# Patient Record
Sex: Female | Born: 1960 | Race: White | Hispanic: No | Smoking: Former smoker
Health system: Southern US, Community
[De-identification: ages and names within clinical notes are randomized; demographics above are authoritative.]

## PROBLEM LIST (undated history)

## (undated) DIAGNOSIS — K589 Irritable bowel syndrome without diarrhea: Secondary | ICD-10-CM

## (undated) DIAGNOSIS — F419 Anxiety disorder, unspecified: Secondary | ICD-10-CM

## (undated) DIAGNOSIS — K5000 Crohn's disease of small intestine without complications: Secondary | ICD-10-CM

## (undated) DIAGNOSIS — R51 Headache: Secondary | ICD-10-CM

## (undated) DIAGNOSIS — K449 Diaphragmatic hernia without obstruction or gangrene: Secondary | ICD-10-CM

## (undated) DIAGNOSIS — K561 Intussusception: Secondary | ICD-10-CM

## (undated) DIAGNOSIS — J40 Bronchitis, not specified as acute or chronic: Secondary | ICD-10-CM

## (undated) DIAGNOSIS — I341 Nonrheumatic mitral (valve) prolapse: Secondary | ICD-10-CM

## (undated) DIAGNOSIS — T884XXA Failed or difficult intubation, initial encounter: Secondary | ICD-10-CM

## (undated) DIAGNOSIS — R32 Unspecified urinary incontinence: Secondary | ICD-10-CM

## (undated) DIAGNOSIS — F32A Depression, unspecified: Secondary | ICD-10-CM

## (undated) DIAGNOSIS — F329 Major depressive disorder, single episode, unspecified: Secondary | ICD-10-CM

## (undated) DIAGNOSIS — I10 Essential (primary) hypertension: Secondary | ICD-10-CM

## (undated) DIAGNOSIS — D126 Benign neoplasm of colon, unspecified: Secondary | ICD-10-CM

## (undated) DIAGNOSIS — K219 Gastro-esophageal reflux disease without esophagitis: Secondary | ICD-10-CM

## (undated) DIAGNOSIS — R011 Cardiac murmur, unspecified: Secondary | ICD-10-CM

## (undated) DIAGNOSIS — G2581 Restless legs syndrome: Secondary | ICD-10-CM

## (undated) DIAGNOSIS — D649 Anemia, unspecified: Secondary | ICD-10-CM

## (undated) HISTORY — DX: Intussusception: K56.1

## (undated) HISTORY — DX: Crohn's disease of small intestine without complications: K50.00

## (undated) HISTORY — DX: Diaphragmatic hernia without obstruction or gangrene: K44.9

## (undated) HISTORY — DX: Nonrheumatic mitral (valve) prolapse: I34.1

## (undated) HISTORY — DX: Irritable bowel syndrome without diarrhea: K58.9

## (undated) HISTORY — PX: ILEOSTOMY: SHX1783

## (undated) HISTORY — DX: Restless legs syndrome: G25.81

## (undated) HISTORY — PX: TONSILLECTOMY: SUR1361

---
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
## 2000-11-13 ENCOUNTER — Encounter: Admission: RE | Admit: 2000-11-13 | Discharge: 2000-11-13 | Payer: Self-pay | Admitting: Obstetrics and Gynecology

## 2000-11-13 ENCOUNTER — Encounter: Payer: Self-pay | Admitting: Obstetrics and Gynecology

## 2002-05-03 ENCOUNTER — Ambulatory Visit (HOSPITAL_COMMUNITY): Admission: RE | Admit: 2002-05-03 | Discharge: 2002-05-03 | Payer: Self-pay | Admitting: Gastroenterology

## 2002-05-03 ENCOUNTER — Encounter: Payer: Self-pay | Admitting: Gastroenterology

## 2002-05-03 DIAGNOSIS — K2100 Gastro-esophageal reflux disease with esophagitis, without bleeding: Secondary | ICD-10-CM | POA: Insufficient documentation

## 2002-05-03 DIAGNOSIS — K449 Diaphragmatic hernia without obstruction or gangrene: Secondary | ICD-10-CM | POA: Insufficient documentation

## 2005-08-28 ENCOUNTER — Ambulatory Visit: Payer: Self-pay | Admitting: Gastroenterology

## 2005-10-01 ENCOUNTER — Ambulatory Visit: Payer: Self-pay | Admitting: Family Medicine

## 2007-02-16 ENCOUNTER — Ambulatory Visit: Payer: Self-pay | Admitting: Family Medicine

## 2007-03-01 ENCOUNTER — Ambulatory Visit: Payer: Self-pay | Admitting: Family Medicine

## 2007-07-08 ENCOUNTER — Ambulatory Visit: Payer: Self-pay | Admitting: Family Medicine

## 2007-10-26 ENCOUNTER — Ambulatory Visit: Payer: Self-pay | Admitting: Family Medicine

## 2007-11-03 ENCOUNTER — Ambulatory Visit: Payer: Self-pay | Admitting: Family Medicine

## 2008-03-10 ENCOUNTER — Ambulatory Visit: Payer: Self-pay | Admitting: Family Medicine

## 2008-03-17 ENCOUNTER — Telehealth: Payer: Self-pay | Admitting: Gastroenterology

## 2008-03-17 ENCOUNTER — Ambulatory Visit: Payer: Self-pay | Admitting: Gastroenterology

## 2008-03-17 DIAGNOSIS — K219 Gastro-esophageal reflux disease without esophagitis: Secondary | ICD-10-CM | POA: Insufficient documentation

## 2008-03-17 DIAGNOSIS — I341 Nonrheumatic mitral (valve) prolapse: Secondary | ICD-10-CM | POA: Insufficient documentation

## 2008-03-17 DIAGNOSIS — R198 Other specified symptoms and signs involving the digestive system and abdomen: Secondary | ICD-10-CM | POA: Insufficient documentation

## 2008-04-03 ENCOUNTER — Ambulatory Visit: Payer: Self-pay | Admitting: Family Medicine

## 2008-04-04 ENCOUNTER — Ambulatory Visit: Payer: Self-pay | Admitting: Gastroenterology

## 2008-04-04 LAB — HM COLONOSCOPY: HM Colonoscopy: NORMAL

## 2008-05-03 ENCOUNTER — Ambulatory Visit: Payer: Self-pay | Admitting: Family Medicine

## 2008-05-22 ENCOUNTER — Ambulatory Visit: Payer: Self-pay | Admitting: Gastroenterology

## 2008-07-26 ENCOUNTER — Ambulatory Visit: Payer: Self-pay | Admitting: Family Medicine

## 2008-07-27 ENCOUNTER — Ambulatory Visit: Payer: Self-pay | Admitting: Family Medicine

## 2008-08-07 ENCOUNTER — Ambulatory Visit: Payer: Self-pay | Admitting: Family Medicine

## 2008-10-13 ENCOUNTER — Ambulatory Visit: Payer: Self-pay | Admitting: Family Medicine

## 2008-11-03 HISTORY — PX: KNEE ARTHROSCOPY: SUR90

## 2009-01-01 ENCOUNTER — Ambulatory Visit: Payer: Self-pay | Admitting: Family Medicine

## 2009-01-01 ENCOUNTER — Encounter: Admission: RE | Admit: 2009-01-01 | Discharge: 2009-01-01 | Payer: Self-pay | Admitting: Family Medicine

## 2009-01-02 ENCOUNTER — Ambulatory Visit: Payer: Self-pay | Admitting: Family Medicine

## 2009-02-19 ENCOUNTER — Ambulatory Visit: Payer: Self-pay | Admitting: Family Medicine

## 2009-02-20 ENCOUNTER — Encounter: Admission: RE | Admit: 2009-02-20 | Discharge: 2009-02-20 | Payer: Self-pay | Admitting: Family Medicine

## 2009-04-30 ENCOUNTER — Ambulatory Visit: Payer: Self-pay | Admitting: Family Medicine

## 2009-06-26 ENCOUNTER — Ambulatory Visit: Payer: Self-pay | Admitting: Family Medicine

## 2009-06-26 ENCOUNTER — Encounter: Admission: RE | Admit: 2009-06-26 | Discharge: 2009-06-26 | Payer: Self-pay | Admitting: Family Medicine

## 2009-07-06 ENCOUNTER — Ambulatory Visit (HOSPITAL_BASED_OUTPATIENT_CLINIC_OR_DEPARTMENT_OTHER): Admission: RE | Admit: 2009-07-06 | Discharge: 2009-07-06 | Payer: Self-pay | Admitting: Specialist

## 2009-07-17 ENCOUNTER — Ambulatory Visit: Payer: Self-pay | Admitting: Family Medicine

## 2010-02-08 ENCOUNTER — Ambulatory Visit: Payer: Self-pay | Admitting: Family Medicine

## 2010-03-21 ENCOUNTER — Ambulatory Visit: Payer: Self-pay | Admitting: Family Medicine

## 2010-03-22 ENCOUNTER — Encounter: Admission: RE | Admit: 2010-03-22 | Discharge: 2010-03-22 | Payer: Self-pay | Admitting: Family Medicine

## 2010-03-22 ENCOUNTER — Ambulatory Visit: Payer: Self-pay | Admitting: Family Medicine

## 2010-04-02 ENCOUNTER — Ambulatory Visit: Payer: Self-pay | Admitting: Family Medicine

## 2010-04-11 ENCOUNTER — Ambulatory Visit: Payer: Self-pay | Admitting: Family Medicine

## 2010-04-19 ENCOUNTER — Ambulatory Visit: Payer: Self-pay | Admitting: Family Medicine

## 2010-05-03 ENCOUNTER — Ambulatory Visit: Payer: Self-pay | Admitting: Family Medicine

## 2010-06-04 ENCOUNTER — Ambulatory Visit: Payer: Self-pay | Admitting: Family Medicine

## 2010-06-21 ENCOUNTER — Ambulatory Visit: Payer: Self-pay | Admitting: Family Medicine

## 2010-07-01 ENCOUNTER — Ambulatory Visit: Payer: Self-pay | Admitting: Family Medicine

## 2010-08-09 ENCOUNTER — Ambulatory Visit: Payer: Self-pay | Admitting: Family Medicine

## 2010-09-13 ENCOUNTER — Ambulatory Visit: Payer: Self-pay | Admitting: Family Medicine

## 2010-11-03 DIAGNOSIS — D649 Anemia, unspecified: Secondary | ICD-10-CM

## 2010-11-03 HISTORY — DX: Anemia, unspecified: D64.9

## 2010-12-03 NOTE — Progress Notes (Signed)
Summary: UPPER ABD PAIN  Medications Added NEXIUM 40 MG CPDR (ESOMEPRAZOLE MAGNESIUM) Take 1 capsule by mouth once a day       Phone Note Call from Patient Call back at [facility] [phone]   Call For: DR [facility] Summary of Call: SEVERE ABD PAIN. UPPER PART OF STOMACH X2WK GETTING WORSE. ON NEXIUM NOT REALLY HELPING. JUST COULD NOT WAIT UNTIL 04-12-08 NEXT AVAIL APPT. CHART REQ FROM CF. Initial call taken by: Irwin Brakeman [facility],  Mar 17, 2008 9:38 AM  Follow-up for Phone Call        rev today with stark 3:00 Follow-up by: Barb Merino RN,  Mar 17, 2008 9:46 AM    New/Updated Medications: NEXIUM 40 MG CPDR (ESOMEPRAZOLE MAGNESIUM) Take 1 capsule by mouth once a day

## 2010-12-03 NOTE — Procedures (Signed)
Summary: Gastroenterology EGD  Gastroenterology EGD   Imported By: Christie Nottingham CMA 03/17/2008 11:47:02  _____________________________________________________________________  External Attachment:    Type:   Image     Comment:   External Document

## 2010-12-03 NOTE — Procedures (Signed)
Summary: Colonoscopy   Colonoscopy  Procedure date:  04/04/2008  Findings:      Location:  [place].    Procedures Next Due Date:    Colonoscopy: 04/2018  Patient Name: Cynthia [name], Cynthia [name] MRN: [id number] Procedure Procedures: Colonoscopy CPT: 13244.  Personnel: Endoscopist: Pricilla Riffle. Fuller Plan, MD, Marval Regal.  Exam Location: Exam performed in Outpatient Clinic. Outpatient  Patient Consent: Procedure, Alternatives, Risks and Benefits discussed, consent obtained, from patient. Consent was obtained by the RN.  Indications Symptoms: Diarrhea Abdominal pain / bloating. Change in bowel habits.  History  Current Medications: Patient is not currently taking Coumadin.  Pre-Exam Physical: Performed Apr 04, 2008. Cardio-pulmonary exam, Rectal exam, HEENT exam , Abdominal exam, Mental status exam WNL.  Comments: Pt. history reviewed/updated, physical exam performed prior to initiation of sedation?Yes Exam Exam: Extent of exam reached: Cecum, extent intended: Cecum.  The cecum was identified by appendiceal orifice and IC valve. Time to Cecum: 00:06: 29. Time for Withdrawl: 00:09:18. Colon retroflexion performed. ASA Classification: II. Tolerance: excellent.  Monitoring: Pulse and BP monitoring, Oximetry used. Supplemental O2 given.  Colon Prep Used MoviPrep for colon prep. Prep results: excellent.  Sedation Meds: Patient assessed and found to be appropriate for moderate (conscious) sedation. Fentanyl 75 mcg. given IV. Versed 10 mg. given IV.  Comments: mildly tortuous colon Findings NORMAL EXAM: Cecum to Rectum.   Assessment Normal examination.  Events  Unplanned Interventions: No intervention was required.  Unplanned Events: There were no complications. Plans  Post Exam Instructions: Post sedation instructions given.  Patient Education: Patient given standard instructions for: a normal exam. IBS.  Disposition: After procedure patient sent to recovery.  After recovery patient sent home.  Scheduling/Referral: Colonoscopy, to [facility] T. Fuller Plan, MD, [facility], around Apr 04, 2018.  Office Visit, to [employer]. Fuller Plan, MD, [facility], around May 04, 2008.    cc: John C. LaLonde MD  This report was created from the original endoscopy report, which was reviewed and signed by the above listed endoscopist.   Appended Document: Colonoscopy     Colonoscopy  Procedure date:  04/04/2008  Findings:      Location:  [place].    Procedures Next Due Date:    Colonoscopy: 04/2018   Patient Name: Cynthia [name], Cynthia [name] MRN: [id number] Procedure Procedures: Colonoscopy CPT: 66440.  Personnel: Endoscopist: Pricilla Riffle. Fuller Plan, MD, Marval Regal.  Exam Location: Exam performed in Outpatient Clinic. Outpatient  Patient Consent: Procedure, Alternatives, Risks and Benefits discussed, consent obtained, from patient. Consent was obtained by the RN.  Indications Symptoms: Diarrhea Abdominal pain / bloating. Change in bowel habits.  History  Current Medications: Patient is not currently taking Coumadin.  Pre-Exam Physical: Performed Apr 04, 2008. Cardio-pulmonary exam, Rectal exam, HEENT exam , Abdominal exam, Mental status exam WNL.  Comments: Pt. history reviewed/updated, physical exam performed prior to initiation of sedation?Yes Exam Exam: Extent of exam reached: Cecum, extent intended: Cecum.  The cecum was identified by appendiceal orifice and IC valve. Time to Cecum: 00:06: 29. Time for Withdrawl: 00:09:18. Colon retroflexion performed. ASA Classification: II. Tolerance: excellent.  Monitoring: Pulse and BP monitoring, Oximetry used. Supplemental O2 given.  Colon Prep Used MoviPrep for colon prep. Prep results: excellent.  Sedation Meds: Patient assessed and found to be appropriate for moderate (conscious) sedation. Fentanyl 75 mcg. given IV. Versed 10 mg. given IV.  Comments: mildly tortuous colon Findings NORMAL EXAM: Cecum to Rectum.    Assessment Normal examination.  Events  Unplanned Interventions: No intervention was required.  Unplanned Events: There were no complications. Plans  Post Exam Instructions: Post sedation instructions given.  Patient Education: Patient given standard instructions for: a normal exam. IBS.  Disposition: After procedure patient sent to recovery. After recovery patient sent home.  Scheduling/Referral: Colonoscopy, to [facility] T. Fuller Plan, MD, [facility], around Apr 04, 2018.  Office Visit, to [employer]. Fuller Plan, MD, [facility], around May 04, 2008.    cc:  John C. LaLonde MD  This report was created from the original endoscopy report, which was reviewed and signed by the above listed endoscopist.

## 2010-12-03 NOTE — Assessment & Plan Note (Signed)
Summary: epigastric pain-sheri   History of Present Illness Visit Type: follow up Primary GI MD: Claudette Head MD [facility] Primary Provider: Sharlot Gowda MD Chief Complaint abdominal pain History of Present Illness:  Cynthia [name] relates epigastric pain associated with a change in bowel habits including alternating diarrhea and constipation for approximately 10 days. She notes no melena or hematochezia. She denies any recent antibiotic usage or medication changes. She relates worsening heartburn belching and bloating. She takes Advil infrequently and she denies other aspirin and NSAID usage.   GI Review of Systems    Reports abdominal pain, acid reflux, belching, bloating, and  heartburn.   Location of  Abdominal pain: epigastric area.     Denies chest pain, dysphagia with liquids, dysphagia with solids, loss of appetite, nausea, vomiting, vomiting blood, weight loss, and  weight gain.    Reports change in bowel habits, constipation, and  diarrhea.    Denies anal fissure, black tarry stools, diverticulosis, fecal incontinence, heme positive stool, hemorrhoids, irritable bowel syndrome, jaundice, light color stool, liver problems, rectal bleeding, and  rectal pain.     Updated Prior Medication List: NEXIUM 40 MG CPDR (ESOMEPRAZOLE MAGNESIUM) Take 1 capsule by mouth once a day EFFEXOR XR 150 MG  CP24 (VENLAFAXINE HCL) 2 once daily ATENOLOL 25 MG  TABS (ATENOLOL) 1once daily IMITREX 100 MG  TABS (SUMATRIPTAN SUCCINATE) prn ADVIL 100 MG  TABS (IBUPROFEN) prn FLEXERIL 10 MG  TABS (CYCLOBENZAPRINE HCL) prn  Current Allergies (reviewed today): ! * IODINE  Past Medical History:    Reviewed history from 03/17/2008 and no changes required:       ESOPHAGITIS, REFLUX (ICD-530.11)       HIATAL HERNIA (ICD-553.3)       MITRAL VALVE PROLAPSE, HX OF (ICD-V12.50)       GERD (ICD-530.81)       MIGRAINE HEADACHES  Past Surgical History:    Reviewed history from 03/17/2008 and no changes  required:       Tonsillectomy   Family History:    Father Skin cancer (melanoma)    Family History of Diabetes: mother  Social History:    Patient currently smokes. 1/2 pack per day    Alcohol Use - yes social    Daily Caffeine Use 2 per day    Illicit Drug Use - no    Patient does not get regular exercise.    Risk Factors:  Tobacco use:  current Drug use:  no Alcohol use:  yes Exercise:  no    Vital Signs:  Patient Profile:   50 Years Old Female CC:      Abdominal Pain Height:     64 inches Weight:      208.50 pounds BMI:     35.92 Pulse rate:   82 / minute Pulse rhythm:   regular BP sitting:   126 / 70  (left arm)  Pt. in pain?   yes    Location:   abdomen    Intensity:   6    Type:       sharp  Vitals Entered By: Chales Abrahams CMA (Mar 17, 2008 2:56 PM)                   Physical Exam  General:     Well developed, well nourished, no acute distress. Head:     Normocephalic and atraumatic. Eyes:     PERRLA, no icterus. Ears:     Normal auditory acuity. Mouth:  No deformity or lesions, dentition normal. Neck:     Supple; no masses or thyromegaly. Chest Wall:     Symmetrical;  no deformities or tenderness. Lungs:     Clear throughout to auscultation. Heart:     Regular rate and rhythm; no murmurs, rubs,  or bruits. Abdomen:     Soft, epigastric tenderness without rebound or guarding. Nondistended. No masses, hepatosplenomegaly or hernias noted. Normal bowel sounds. Msk:     Symmetrical with no gross deformities. Normal posture. Extremities:     No clubbing, cyanosis, edema or deformities noted. Neurologic:     Alert and  oriented x4;  grossly normal neurologically. Psych:     Alert and cooperative. Normal mood and affect.    Impression & Recommendations:  Problem # 1:  CHANGE IN BOWELS (ICD-787.99) New alternating diarrhea and constipation with abdominal bloating and epigastric pain. Rule out colorectal neoplasms, inflammatory bowel  disease, irritable bowel syndrome and other disorders. Obtain stool Hemoccults. Schedule colonoscopy. The risks benefits and alternatives to colonoscopy with possible biopsy and possible polypectomy were discussed with the patient and she consents to proceed.Robinul Forte 2 mg p.o. b.i.d. Orders: Colonoscopy (Colon)   Problem # 2:  ABDOMINAL PAIN-EPIGASTRIC (ICD-789.06) This symptom is likely related to a flare of GERD and/or her change in bowel habits. Orders: Colonoscopy (Colon)   Problem # 3:  ESOPHAGITIS, REFLUX (ICD-530.11) Flare of GERD. Increase Nexium to 40 mg p.o. b.i.d. taken 30 minutes before breakfast and dinner for one month. Return to Nexium 40 mg p.o. q.a.m. afterwards. Reintensify all antireflux measures.  Medications Added to Medication List This Visit: 1)  Effexor Xr 150 Mg Cp24 (Venlafaxine hcl) .... 2 once daily 2)  Atenolol 25 Mg Tabs (Atenolol) .Marland Kitchen.. 1once daily 3)  Imitrex 100 Mg Tabs (Sumatriptan succinate) .... Prn 4)  Advil 100 Mg Tabs (Ibuprofen) .... Prn 5)  Flexeril 10 Mg Tabs (Cyclobenzaprine hcl) .... Prn 6)  Nexium 40 Mg Cpdr (Esomeprazole magnesium) .Marland Kitchen.. 1 capsule twice a day 30 minutes before meals 7)  Nexium 40 Mg Cpdr (Esomeprazole magnesium) .Marland Kitchen.. 1 capsule each day 30 minutes before meal 8)  Robinul-forte 2 Mg Tabs (Glycopyrrolate) .... Take 1 tablet by mouth two times a day 9)  Moviprep 100 Gm Solr (Peg-kcl-nacl-nasulf-na asc-c) .... As per prep instructions.   Patient Instructions: 1)  Avoid foods high in acid content (tomatoes, citrus juices, spicy foods). Avoid eating within two hours of lying down or before exercising. Do not over eat; try smaller more frequent meals. Elevate head of bed twelve inches when sleeping. 2)  Colonoscopy and Flexible Sigmoidoscopy brochure given. 3)  cc: Sharlot Gowda, MD    Prescriptions: MOVIPREP 100 GM  SOLR (PEG-KCL-NACL-NASULF-NA ASC-C) As per prep instructions.  #1 x 0   Entered by:   Christie Nottingham CMA    Authorized by:   Meryl Dare MD [facility]   Signed by:   Christie Nottingham CMA on 03/17/2008   Method used:   Electronically sent to ...       [facility]  [address] [phone]*       [address]       [place], [place]  [address]       Ph: [phone] or [phone]       Fax: [phone]   RxID:   [id number] ROBINUL-FORTE 2 MG  TABS (GLYCOPYRROLATE) take 1 tablet by mouth two times a day  #60 x 5  Entered by:   Christie Nottingham CMA   Authorized by:   Meryl Dare MD [facility]   Signed by:   Christie Nottingham CMA on 03/17/2008   Method used:   Electronically sent to ...       [facility]  [address] [phone]*       [address]       [place], [place]  [address]       Ph: [phone] or [phone]       Fax: [phone]   RxID:   [id number] NEXIUM 40 MG  CPDR (ESOMEPRAZOLE MAGNESIUM) 1 capsule each day 30 minutes before meal  #30 x 11   Entered by:   Christie Nottingham CMA   Authorized by:   Meryl Dare MD [facility]   Signed by:   Christie Nottingham CMA on 03/17/2008   Method used:   Electronically sent to ...       [facility]  [address] [phone]*       [address]       [place], [place]  [address]       Ph: [phone] or [phone]       Fax: [phone]   RxID:   [id number] NEXIUM 40 MG  CPDR (ESOMEPRAZOLE MAGNESIUM) 1 capsule twice a day 30 minutes before meals  #60 x 0   Entered by:   Christie Nottingham CMA   Authorized by:   Meryl Dare MD [facility]   Signed by:   Christie Nottingham CMA on 03/17/2008   Method used:   Electronically sent to ...       [facility] [phone]*       [address]       [place], [place]  [address]       Ph: [phone] or [phone]       Fax: [phone]   RxID:   [id number]  ]

## 2010-12-03 NOTE — Assessment & Plan Note (Signed)
Summary: gerd ibs   History of Present Illness Visit Type: follow up Primary GI MD: Claudette Head MD [facility] Primary Provider: Sharlot Gowda MD Chief Complaint: Patient here to f/u after colonoscopy.  Patient states she is doing well and has no complaints at this time. History of Present Illness:   Mrs. Dambrosio has had an excellent response to Nexium twice daily. Her irritable bowel syndrome with diarrhea has come under excellent control on Robinul.   GI Review of Systems      Denies abdominal pain, acid reflux, belching, bloating, chest pain, dysphagia with liquids, dysphagia with solids, heartburn, loss of appetite, nausea, vomiting, vomiting blood, weight loss, and  weight gain.      Reports irritable bowel syndrome.     Denies anal fissure, black tarry stools, change in bowel habit, constipation, diarrhea, diverticulosis, fecal incontinence, heme positive stool, hemorrhoids, jaundice, light color stool, liver problems, rectal bleeding, and  rectal pain.     Prior Medications Reviewed Using: Patient Recall  Updated Prior Medication List: NEXIUM 40 MG CPDR (ESOMEPRAZOLE MAGNESIUM) Take 1 capsule by mouth two times a day EFFEXOR XR 150 MG  CP24 (VENLAFAXINE HCL) Take 2 tablets by mouth daily ATENOLOL 25 MG  TABS (ATENOLOL) Take 1 tablet by mouth once a day IMITREX 100 MG  TABS (SUMATRIPTAN SUCCINATE) Take 1 tablet by mouth as needed ADVIL 100 MG  TABS (IBUPROFEN) Take 1 tablet by mouth as needed FLEXERIL 10 MG  TABS (CYCLOBENZAPRINE HCL) Take 1 tablet by mouth as needed ROBINUL-FORTE 2 MG  TABS (GLYCOPYRROLATE) Take 1 tablet by mouth two times a day  Current Allergies (reviewed today): ! * IODINE  Past Medical History:    Reviewed history from 03/17/2008 and no changes required:       ESOPHAGITIS, REFLUX (ICD-530.11)       HIATAL HERNIA (ICD-553.3)       MITRAL VALVE PROLAPSE, HX OF (ICD-V12.50)       GERD (ICD-530.81)       MIGRAINE HEADACHES  Past Surgical History:  Reviewed history from 03/17/2008 and no changes required:       Tonsillectomy          Family History:    Father Skin cancer (melanoma)    Family History of Diabetes: mother    Family History of Clotting disorder: Father ?  Social History:    Patient currently smokes. 1/2 pack per day    Alcohol Use - yes social    Daily Caffeine Use 2 per day    Illicit Drug Use - no    Patient does not get regular exercise.     Occupation: Estate agent    Review of Systems       The patient complains of allergy/sinus, heart murmur, night sweats, and urine leakage.         The pertinent positives and negatives are noted as above and in the HPI. All other ROS were negative.    Vital Signs:  Patient Profile:   50 Years Old Female Height:     64 inches Weight:      217 pounds BMI:     37.38 BSA:     2.03 Pulse rate:   64 / minute Pulse rhythm:   regular BP sitting:   138 / 86  (right arm)  Vitals Entered By: Hortense Ramal CMA (May 22, 2008 4:05 PM)                  Physical Exam  General:  Well developed, well nourished, no acute distress.obese.   Lungs:     Clear throughout to auscultation. Heart:     Regular rate and rhythm; no murmurs, rubs,  or bruits. Abdomen:     Soft, nontender and nondistended. No masses, hepatosplenomegaly or hernias noted. Normal bowel sounds. Psych:     Alert and cooperative. Normal mood and affect.    Impression & Recommendations:  Problem # 1:  GERD (ICD-530.81) Continue Nexium 40 mg twice daily taken 30 minutes before breakfast and dinner. Standard antireflux measures.  Problem # 2:  IRRITABLE BOWEL SYNDROME (ICD-564.1) Continue Robinul forte twice daily as needed.   Patient Instructions: 1)  Avoid foods high in acid content (tomatoes, citrus juices, spicy foods). Avoid eating within 3 to 4 hours of lying down or before exercising. Do not over eat; try smaller more frequent meals. Elevate head of bed four inches when  sleeping. 2)  Please schedule a follow-up appointment in 1 year. 3)  Copy Sent To: Sharlot Gowda, MD    ]  Appended Document: gerd ibs    Clinical Lists Changes  Medications: Changed medication from NEXIUM 40 MG CPDR (ESOMEPRAZOLE MAGNESIUM) Take 1 capsule by mouth two times a day to NEXIUM 40 MG CPDR (ESOMEPRAZOLE MAGNESIUM) Take 1 capsule by mouth two times a day - Signed Rx of NEXIUM 40 MG CPDR (ESOMEPRAZOLE MAGNESIUM) Take 1 capsule by mouth two times a day;  #180 x 3;  Signed;  Entered by: Christie Nottingham CMA;  Authorized by: Meryl Dare MD Clementeen Graham;  Method used: Printed then faxed to [employer], , , [place]  , Ph: , Fax: [phone]    Prescriptions: NEXIUM 40 MG CPDR (ESOMEPRAZOLE MAGNESIUM) Take 1 capsule by mouth two times a day  #180 x 3   Entered by:   Christie Nottingham CMA   Authorized by:   Meryl Dare MD [facility]   Signed by:   Christie Nottingham CMA on 05/22/2008   Method used:   Printed then faxed to ...       [facility]             ,          Ph:        Fax: [phone]   RxID:   [id number]

## 2010-12-09 ENCOUNTER — Ambulatory Visit (INDEPENDENT_AMBULATORY_CARE_PROVIDER_SITE_OTHER): Payer: BC Managed Care – PPO | Admitting: Family Medicine

## 2010-12-09 DIAGNOSIS — M7710 Lateral epicondylitis, unspecified elbow: Secondary | ICD-10-CM

## 2011-01-13 DIAGNOSIS — Z860100 Personal history of colon polyps, unspecified: Secondary | ICD-10-CM

## 2011-01-13 HISTORY — DX: Personal history of colon polyps, unspecified: Z86.0100

## 2011-01-13 HISTORY — DX: Personal history of colonic polyps: Z86.010

## 2011-02-07 LAB — POCT PREGNANCY, URINE: Preg Test, Ur: NEGATIVE

## 2011-02-07 LAB — POCT I-STAT 4, (NA,K, GLUC, HGB,HCT)
Glucose, Bld: 78 mg/dL (ref 70–99)
HCT: 39 % (ref 36.0–46.0)
Hemoglobin: 13.3 g/dL (ref 12.0–15.0)
Potassium: 3.8 mEq/L (ref 3.5–5.1)
Sodium: 144 mEq/L (ref 135–145)

## 2011-05-12 ENCOUNTER — Encounter: Payer: Self-pay | Admitting: Family Medicine

## 2011-05-12 ENCOUNTER — Ambulatory Visit (INDEPENDENT_AMBULATORY_CARE_PROVIDER_SITE_OTHER): Payer: BC Managed Care – PPO | Admitting: Family Medicine

## 2011-05-12 DIAGNOSIS — K219 Gastro-esophageal reflux disease without esophagitis: Secondary | ICD-10-CM

## 2011-05-12 DIAGNOSIS — D649 Anemia, unspecified: Secondary | ICD-10-CM

## 2011-05-12 DIAGNOSIS — K209 Esophagitis, unspecified without bleeding: Secondary | ICD-10-CM

## 2011-05-12 NOTE — Progress Notes (Signed)
  Subjective:    Patient ID: Cynthia [name], female    DOB: 1960-11-28, 50 y.o.   MRN: [id number]  HPI She is a four-day history of mid chest pain radiating into the back but no shortness of breath, sweating, nausea, vomiting. She is having some indigestion symptoms especially eructation. Symptoms does tend to get worse at night She takes one Nexium every morning. She has had an endoscopy which apparently did show reflux type symptoms. She also has a history of anemia and is scheduled for hysterectomy. Apparently she also has had an abnormal Pap showing dysplasia. She also gives a history of uterine and bladder prolapse   Review of Systems     Objective:   Physical Exam alert and in no distress. Tympanic membranes and canals are normal. Throat is clear. Tonsils are normal. Neck is supple without adenopathy or thyromegaly. Cardiac exam shows a regular sinus rhythm without murmurs or gallops. Lungs are clear to auscultation. No chest wall pain . A trial of liquid antacid was given with little benefit.        Assessment & Plan:  Chest pain probably esophagitis. Anemia. Uterine and bladder prolapse. I will have her increase Nexium to twice per day and call me at the end of the week to let me now she is doing CBC will also be drawn

## 2011-05-12 NOTE — Patient Instructions (Signed)
Use Nexium twice a day and call me Friday

## 2011-05-13 LAB — CBC WITH DIFFERENTIAL/PLATELET
Basophils Absolute: 0 10*3/uL (ref 0.0–0.1)
Basophils Relative: 0 % (ref 0–1)
Eosinophils Absolute: 0.2 10*3/uL (ref 0.0–0.7)
Eosinophils Relative: 3 % (ref 0–5)
HCT: 41.3 % (ref 36.0–46.0)
Hemoglobin: 12.9 g/dL (ref 12.0–15.0)
Lymphocytes Relative: 28 % (ref 12–46)
Lymphs Abs: 2.4 10*3/uL (ref 0.7–4.0)
MCH: 26.3 pg (ref 26.0–34.0)
MCHC: 31.2 g/dL (ref 30.0–36.0)
MCV: 84.1 fL (ref 78.0–100.0)
Monocytes Absolute: 0.6 10*3/uL (ref 0.1–1.0)
Monocytes Relative: 7 % (ref 3–12)
Neutro Abs: 5.3 10*3/uL (ref 1.7–7.7)
Neutrophils Relative %: 62 % (ref 43–77)
Platelets: 330 10*3/uL (ref 150–400)
RBC: 4.91 MIL/uL (ref 3.87–5.11)
RDW: 18.4 % — ABNORMAL HIGH (ref 11.5–15.5)
WBC: 8.5 10*3/uL (ref 4.0–10.5)

## 2011-05-14 ENCOUNTER — Telehealth: Payer: Self-pay

## 2011-05-14 NOTE — Telephone Encounter (Signed)
Error

## 2011-05-14 NOTE — Telephone Encounter (Signed)
Called pt to let her know labs are normal

## 2011-05-29 ENCOUNTER — Encounter: Payer: Self-pay | Admitting: Family Medicine

## 2011-05-29 ENCOUNTER — Ambulatory Visit (INDEPENDENT_AMBULATORY_CARE_PROVIDER_SITE_OTHER): Payer: BC Managed Care – PPO | Admitting: Family Medicine

## 2011-05-29 VITALS — BP 120/70 | HR 81 | Temp 98.4°F | Wt 244.0 lb

## 2011-05-29 DIAGNOSIS — J069 Acute upper respiratory infection, unspecified: Secondary | ICD-10-CM

## 2011-05-29 NOTE — Patient Instructions (Signed)
Use NyQuil for coughing and if your symptoms get worse over the weekend, call me.

## 2011-05-29 NOTE — Progress Notes (Signed)
  Subjective:    Patient ID: Cynthia [name], female    DOB: Feb 01, 1961, 50 y.o.   MRN: [id number]  HPI She has a five-day history of PND , cough that has now become productive ,sore throat, course the but no fever, nasal congestion, earache, fever or chills. She continues to smoke. Her main concern is coughing   Review of Systems     Objective:   Physical Exam alert and in no distress. Tympanic membranes and canals are normal. Throat is clear. Tonsils are normal. Neck is supple without adenopathy or thyromegaly. Cardiac exam shows a regular sinus rhythm without murmurs or gallops. Lungs are clear to auscultation. Nasal mucosa is slightly red with slight tenderness over frontal and maxillary sinuses        Assessment & Plan:  URI Use NyQuil and if you symptoms last through the weekend, call me for an antibiotic

## 2011-06-02 ENCOUNTER — Telehealth: Payer: Self-pay

## 2011-06-02 MED ORDER — AMOXICILLIN 875 MG PO TABS: 875.0000 mg | ORAL_TABLET | Freq: Two times a day (BID) | ORAL | Status: AC

## 2011-06-02 NOTE — Telephone Encounter (Signed)
She said still no better cough sore throat still there can you please call in antibiotic at [facility]

## 2011-06-02 NOTE — Telephone Encounter (Signed)
Addended by: Denita Lung on: 06/02/2011 09:17 AM   Modules accepted: Orders

## 2011-06-02 NOTE — Telephone Encounter (Signed)
Amoxil was called in

## 2011-06-04 LAB — HM MAMMOGRAPHY: HM Mammogram: NORMAL

## 2011-06-10 ENCOUNTER — Encounter (HOSPITAL_COMMUNITY): Payer: Self-pay

## 2011-06-10 ENCOUNTER — Other Ambulatory Visit: Payer: Self-pay

## 2011-06-10 ENCOUNTER — Encounter (HOSPITAL_COMMUNITY)
Admission: RE | Admit: 2011-06-10 | Discharge: 2011-06-10 | Disposition: A | Discharge status: Home / Self Care | Payer: BC Managed Care – PPO | Source: Ambulatory Visit | Attending: Obstetrics and Gynecology | Admitting: Obstetrics and Gynecology

## 2011-06-10 HISTORY — DX: Gastro-esophageal reflux disease without esophagitis: K21.9

## 2011-06-10 HISTORY — DX: Cardiac murmur, unspecified: R01.1

## 2011-06-10 HISTORY — DX: Essential (primary) hypertension: I10

## 2011-06-10 HISTORY — DX: Headache: R51

## 2011-06-10 HISTORY — DX: Anxiety disorder, unspecified: F41.9

## 2011-06-10 HISTORY — DX: Depression, unspecified: F32.A

## 2011-06-10 HISTORY — DX: Anemia, unspecified: D64.9

## 2011-06-10 HISTORY — DX: Unspecified urinary incontinence: R32

## 2011-06-10 HISTORY — DX: Major depressive disorder, single episode, unspecified: F32.9

## 2011-06-10 LAB — BASIC METABOLIC PANEL
BUN: 15 mg/dL (ref 6–23)
CO2: 31 mEq/L (ref 19–32)
Calcium: 9.8 mg/dL (ref 8.4–10.5)
Chloride: 99 mEq/L (ref 96–112)
Creatinine, Ser: 0.86 mg/dL (ref 0.50–1.10)
GFR calc Af Amer: >60 mL/min (ref >60)
GFR calc non Af Amer: >60 mL/min (ref >60)
Glucose, Bld: 103 mg/dL — ABNORMAL HIGH (ref 70–99)
Potassium: 2.8 mEq/L — ABNORMAL LOW (ref 3.5–5.1)
Sodium: 139 mEq/L (ref 135–145)

## 2011-06-10 LAB — CBC
HCT: 39.2 % (ref 36.0–46.0)
Hemoglobin: 12.9 g/dL (ref 12.0–15.0)
MCH: 28.2 pg (ref 26.0–34.0)
MCHC: 32.9 g/dL (ref 30.0–36.0)
MCV: 85.8 fL (ref 78.0–100.0)
Platelets: 261 10*3/uL (ref 150–400)
RBC: 4.57 MIL/uL (ref 3.87–5.11)
RDW: 16.3 % — ABNORMAL HIGH (ref 11.5–15.5)
WBC: 6.7 10*3/uL (ref 4.0–10.5)

## 2011-06-10 LAB — SURGICAL PCR SCREEN
MRSA, PCR: NEGATIVE
Staphylococcus aureus: POSITIVE — AB

## 2011-06-10 NOTE — Anesthesia Preprocedure Evaluation (Signed)
Anesthesia Evaluation  Name, MR# and DOB Patient awake  General Assessment Comment  Reviewed: Allergy & Precautions, H&P , Patient's Chart, lab work & pertinent test results, reviewed documented beta blocker date and time   History of Anesthesia Complications Negative for: history of anesthetic complications  Airway Mallampati: I TM Distance: >3 FB Neck ROM: full    Dental  (+) Teeth Intact   Pulmonary  clear to auscultation  breath sounds clear to auscultation none    Cardiovascular Exercise Tolerance: Good hypertension (will take atenolol on DOS), Pt. on home beta blockers + Valvular Problems/Murmurs MVP regular Normal    Neuro/Psych   Headaches (migraines every 4-5 months)  (+) PSYCHIATRIC DISORDERS, Anxiety, Depression,    GI/Hepatic/Renal (+) hiatal hernia,  GERD (will take priolsec on DOS) Controlled and Medicated     Endo/Other  (+)   Morbid obesity  Abdominal   Musculoskeletal   Hematology   Peds  Reproductive/Obstetrics    Anesthesia Other Findings             Anesthesia Physical Anesthesia Plan  ASA: III  Anesthesia Plan: General   Post-op Pain Management:    Induction:   Airway Management Planned:   Additional Equipment:   Intra-op Plan:   Post-operative Plan:   Informed Consent: I have reviewed the patients History and Physical, chart, labs and discussed the procedure including the risks, benefits and alternatives for the proposed anesthesia with the patient or authorized representative who has indicated his/her understanding and acceptance.   Dental Advisory Given  Plan Discussed with: CRNA and Surgeon  Anesthesia Plan Comments:         Anesthesia Quick Evaluation

## 2011-06-10 NOTE — Patient Instructions (Signed)
20 Cynthia [name]  06/10/2011   Your procedure is scheduled on:  06/17/11  Report to [facility] at 0600 AM.  Call this number if you have problems the morning of surgery: [phone]   Remember:   Do not eat food:After Midnight.  Do not drink clear liquids: After Midnight.  Take these medicines the morning of surgery with A SIP OF WATER: Tenoretic, Nexium   Do not wear jewelry, make-up or nail polish.  Do not wear lotions, powders, or perfumes. You may wear deodorant.  Do not shave 48 hours prior to surgery.  Do not bring valuables to the hospital.  Contacts, dentures or bridgework may not be worn into surgery.  Leave suitcase in the car. After surgery it may be brought to your room.  For patients admitted to the hospital, checkout time is 11:00 AM the day of discharge.   Patients discharged the day of surgery will not be allowed to drive home.  Name and phone number of your driver: [name]- [phone]  Special Instructions: CHG Shower Use Special Wash: 1/2 bottle night before surgery and 1/2 bottle morning of surgery.   Please read over the following fact sheets that you were given: Care and Recovery After Surgery

## 2011-06-17 ENCOUNTER — Other Ambulatory Visit: Payer: Self-pay | Admitting: Obstetrics and Gynecology

## 2011-06-17 ENCOUNTER — Encounter (HOSPITAL_COMMUNITY): Payer: Self-pay | Admitting: Anesthesiology

## 2011-06-17 ENCOUNTER — Encounter (HOSPITAL_COMMUNITY)
Admission: RE | Disposition: A | Discharge status: Home / Self Care | Payer: Self-pay | Source: Ambulatory Visit | Attending: Obstetrics and Gynecology

## 2011-06-17 ENCOUNTER — Inpatient Hospital Stay (HOSPITAL_COMMUNITY)
Admission: RE | Admit: 2011-06-17 | Discharge: 2011-06-19 | DRG: 359 | Disposition: A | Discharge status: Home / Self Care | Payer: BC Managed Care – PPO | Source: Ambulatory Visit | Attending: Obstetrics and Gynecology | Admitting: Obstetrics and Gynecology

## 2011-06-17 ENCOUNTER — Ambulatory Visit (HOSPITAL_COMMUNITY): Payer: BC Managed Care – PPO | Admitting: Anesthesiology

## 2011-06-17 DIAGNOSIS — Z5331 Laparoscopic surgical procedure converted to open procedure: Secondary | ICD-10-CM

## 2011-06-17 DIAGNOSIS — I059 Rheumatic mitral valve disease, unspecified: Secondary | ICD-10-CM | POA: Diagnosis present

## 2011-06-17 DIAGNOSIS — N920 Excessive and frequent menstruation with regular cycle: Secondary | ICD-10-CM | POA: Diagnosis present

## 2011-06-17 DIAGNOSIS — K589 Irritable bowel syndrome without diarrhea: Secondary | ICD-10-CM

## 2011-06-17 DIAGNOSIS — Z01818 Encounter for other preprocedural examination: Secondary | ICD-10-CM

## 2011-06-17 DIAGNOSIS — N812 Incomplete uterovaginal prolapse: Principal | ICD-10-CM | POA: Diagnosis present

## 2011-06-17 DIAGNOSIS — R198 Other specified symptoms and signs involving the digestive system and abdomen: Secondary | ICD-10-CM

## 2011-06-17 DIAGNOSIS — R6889 Other general symptoms and signs: Secondary | ICD-10-CM

## 2011-06-17 DIAGNOSIS — K449 Diaphragmatic hernia without obstruction or gangrene: Secondary | ICD-10-CM

## 2011-06-17 DIAGNOSIS — R1013 Epigastric pain: Secondary | ICD-10-CM

## 2011-06-17 DIAGNOSIS — D251 Intramural leiomyoma of uterus: Secondary | ICD-10-CM | POA: Diagnosis present

## 2011-06-17 DIAGNOSIS — K219 Gastro-esophageal reflux disease without esophagitis: Secondary | ICD-10-CM

## 2011-06-17 DIAGNOSIS — Z8679 Personal history of other diseases of the circulatory system: Secondary | ICD-10-CM

## 2011-06-17 DIAGNOSIS — K639 Disease of intestine, unspecified: Secondary | ICD-10-CM | POA: Diagnosis present

## 2011-06-17 DIAGNOSIS — Z01812 Encounter for preprocedural laboratory examination: Secondary | ICD-10-CM

## 2011-06-17 DIAGNOSIS — I10 Essential (primary) hypertension: Secondary | ICD-10-CM | POA: Diagnosis present

## 2011-06-17 DIAGNOSIS — K2100 Gastro-esophageal reflux disease with esophagitis, without bleeding: Secondary | ICD-10-CM

## 2011-06-17 DIAGNOSIS — N393 Stress incontinence (female) (male): Secondary | ICD-10-CM | POA: Diagnosis present

## 2011-06-17 HISTORY — PX: ABDOMINAL HYSTERECTOMY: SHX81

## 2011-06-17 HISTORY — PX: SALPINGOOPHORECTOMY: SHX82

## 2011-06-17 HISTORY — PX: LAPAROSCOPIC ASSISTED VAGINAL HYSTERECTOMY: SHX5398

## 2011-06-17 LAB — POTASSIUM: Potassium: 3.6 mEq/L (ref 3.5–5.1)

## 2011-06-17 SURGERY — HYSTERECTOMY, ABDOMINAL
Anesthesia: General | Site: Abdomen | Wound class: Clean Contaminated

## 2011-06-17 MED ORDER — ROCURONIUM BROMIDE 50 MG/5ML IV SOLN
INTRAVENOUS | Status: AC
  Filled 2011-06-17: qty 1

## 2011-06-17 MED ORDER — MORPHINE SULFATE (PF) 1 MG/ML IV SOLN
INTRAVENOUS | Status: DC
  Administered 2011-06-17: 28.4 mg via INTRAVENOUS
  Administered 2011-06-17: 15.5 mL via INTRAVENOUS
  Administered 2011-06-18: 0.9 mg via INTRAVENOUS
  Administered 2011-06-18: 4.5 mg via INTRAVENOUS

## 2011-06-17 MED ORDER — HYDROMORPHONE HCL 1 MG/ML IJ SOLN
INTRAMUSCULAR | Status: AC
  Filled 2011-06-17: qty 1

## 2011-06-17 MED ORDER — ROCURONIUM BROMIDE 100 MG/10ML IV SOLN
INTRAVENOUS | Status: DC | PRN
  Administered 2011-06-17: 20 mg via INTRAVENOUS
  Administered 2011-06-17: 50 mg via INTRAVENOUS
  Administered 2011-06-17: 10 mg via INTRAVENOUS
  Administered 2011-06-17 (×2): 20 mg via INTRAVENOUS

## 2011-06-17 MED ORDER — FENTANYL CITRATE 0.05 MG/ML IJ SOLN: 25.0000 ug | INTRAMUSCULAR | Status: DC | PRN

## 2011-06-17 MED ORDER — ONDANSETRON HCL 4 MG/2ML IJ SOLN: 4.0000 mg | Freq: Four times a day (QID) | INTRAMUSCULAR | Status: DC | PRN

## 2011-06-17 MED ORDER — LIDOCAINE HCL (CARDIAC) 20 MG/ML IV SOLN
INTRAVENOUS | Status: AC
  Filled 2011-06-17: qty 5

## 2011-06-17 MED ORDER — MORPHINE SULFATE (PF) 1 MG/ML IV SOLN
INTRAVENOUS | Status: DC
  Administered 2011-06-17: 25 mg via INTRAVENOUS
  Administered 2011-06-18: 4.5 mg via INTRAVENOUS
  Administered 2011-06-18: 6 mg via INTRAVENOUS
  Administered 2011-06-18: 0.9 mg via INTRAVENOUS
  Filled 2011-06-17: qty 25

## 2011-06-17 MED ORDER — TEMAZEPAM 15 MG PO CAPS
15.0000 mg | ORAL_CAPSULE | Freq: Every evening | ORAL | Status: DC | PRN
  Administered 2011-06-18: 30 mg via ORAL
  Filled 2011-06-17: qty 2

## 2011-06-17 MED ORDER — HYDROMORPHONE HCL 1 MG/ML IJ SOLN
INTRAMUSCULAR | Status: DC | PRN
  Administered 2011-06-17: 1 mg via INTRAVENOUS

## 2011-06-17 MED ORDER — LACTATED RINGERS IV SOLN
INTRAVENOUS | Status: DC
  Administered 2011-06-17 – 2011-06-18 (×3): via INTRAVENOUS

## 2011-06-17 MED ORDER — MORPHINE SULFATE (PF) 1 MG/ML IV SOLN
INTRAVENOUS | Status: DC
  Administered 2011-06-17: 25 mg via INTRAVENOUS
  Filled 2011-06-17: qty 25

## 2011-06-17 MED ORDER — ONDANSETRON HCL 4 MG/2ML IJ SOLN
INTRAMUSCULAR | Status: DC | PRN
  Administered 2011-06-17: 4 mg via INTRAVENOUS

## 2011-06-17 MED ORDER — DEXTROSE 5 % IV SOLN
1.0000 g | Freq: Once | INTRAVENOUS | Status: DC
  Filled 2011-06-17: qty 1

## 2011-06-17 MED ORDER — FENTANYL CITRATE 0.05 MG/ML IJ SOLN
INTRAMUSCULAR | Status: AC
  Filled 2011-06-17: qty 5

## 2011-06-17 MED ORDER — DEXTROSE 5 % IV SOLN
1.0000 g | INTRAVENOUS | Status: DC | PRN
  Administered 2011-06-17: 1 g via INTRAVENOUS

## 2011-06-17 MED ORDER — DEXAMETHASONE SODIUM PHOSPHATE 10 MG/ML IJ SOLN
INTRAMUSCULAR | Status: AC
  Filled 2011-06-17: qty 1

## 2011-06-17 MED ORDER — DIPHENHYDRAMINE HCL 50 MG/ML IJ SOLN: 12.5000 mg | Freq: Four times a day (QID) | INTRAMUSCULAR | Status: DC | PRN

## 2011-06-17 MED ORDER — FENTANYL CITRATE 0.05 MG/ML IJ SOLN
INTRAMUSCULAR | Status: DC | PRN
  Administered 2011-06-17 (×3): 100 ug via INTRAVENOUS
  Administered 2011-06-17: 50 ug via INTRAVENOUS

## 2011-06-17 MED ORDER — MIDAZOLAM HCL 5 MG/5ML IJ SOLN
INTRAMUSCULAR | Status: DC | PRN
  Administered 2011-06-17: 2 mg via INTRAVENOUS

## 2011-06-17 MED ORDER — SODIUM CHLORIDE 0.9 % IR SOLN
Status: DC | PRN
  Administered 2011-06-17: 1000 mL

## 2011-06-17 MED ORDER — FENTANYL CITRATE 0.05 MG/ML IJ SOLN
INTRAMUSCULAR | Status: AC
  Filled 2011-06-17: qty 2

## 2011-06-17 MED ORDER — LIDOCAINE HCL (CARDIAC) 20 MG/ML IV SOLN
INTRAVENOUS | Status: DC | PRN
  Administered 2011-06-17: 50 mg via INTRAVENOUS

## 2011-06-17 MED ORDER — DIPHENHYDRAMINE HCL 12.5 MG/5ML PO ELIX: 12.5000 mg | ORAL_SOLUTION | Freq: Four times a day (QID) | ORAL | Status: DC | PRN

## 2011-06-17 MED ORDER — PANTOPRAZOLE SODIUM 40 MG PO TBEC: 40.0000 mg | DELAYED_RELEASE_TABLET | Freq: Once | ORAL | Status: DC | PRN

## 2011-06-17 MED ORDER — ONDANSETRON HCL 4 MG/2ML IJ SOLN
INTRAMUSCULAR | Status: AC
  Filled 2011-06-17: qty 2

## 2011-06-17 MED ORDER — LACTATED RINGERS IV SOLN
INTRAVENOUS | Status: DC
Start: 2011-06-17 — End: 2011-06-17
  Administered 2011-06-17 (×4): via INTRAVENOUS

## 2011-06-17 MED ORDER — MENTHOL 3 MG MT LOZG
1.0000 | LOZENGE | OROMUCOSAL | Status: DC | PRN
  Administered 2011-06-18: 3 mg via ORAL
  Filled 2011-06-17: qty 9

## 2011-06-17 MED ORDER — IBUPROFEN 600 MG PO TABS
600.0000 mg | ORAL_TABLET | Freq: Four times a day (QID) | ORAL | Status: DC | PRN
  Administered 2011-06-18 – 2011-06-19 (×2): 600 mg via ORAL
  Filled 2011-06-17 (×2): qty 1

## 2011-06-17 MED ORDER — EPHEDRINE 5 MG/ML INJ
INTRAVENOUS | Status: AC
  Filled 2011-06-17: qty 10

## 2011-06-17 MED ORDER — PROPOFOL 10 MG/ML IV EMUL
INTRAVENOUS | Status: DC | PRN
  Administered 2011-06-17: 200 mg via INTRAVENOUS

## 2011-06-17 MED ORDER — DEXAMETHASONE SODIUM PHOSPHATE 4 MG/ML IJ SOLN
INTRAMUSCULAR | Status: DC | PRN
  Administered 2011-06-17 (×2): 5 mg via INTRAVENOUS

## 2011-06-17 MED ORDER — HYDROMORPHONE HCL 1 MG/ML IJ SOLN
0.5000 mg | INTRAMUSCULAR | Status: DC | PRN
  Administered 2011-06-17 (×2): 0.5 mg via INTRAVENOUS

## 2011-06-17 MED ORDER — SIMETHICONE 80 MG PO CHEW: 80.0000 mg | CHEWABLE_TABLET | Freq: Four times a day (QID) | ORAL | Status: DC | PRN

## 2011-06-17 MED ORDER — MIDAZOLAM HCL 2 MG/2ML IJ SOLN
INTRAMUSCULAR | Status: AC
  Filled 2011-06-17: qty 2

## 2011-06-17 MED ORDER — NEOSTIGMINE METHYLSULFATE 1 MG/ML IJ SOLN
INTRAMUSCULAR | Status: AC
  Filled 2011-06-17: qty 10

## 2011-06-17 MED ORDER — SCOPOLAMINE 1 MG/3DAYS TD PT72: 1.0000 | MEDICATED_PATCH | Freq: Once | TRANSDERMAL | Status: DC | PRN

## 2011-06-17 MED ORDER — GLYCOPYRROLATE 0.2 MG/ML IJ SOLN
INTRAMUSCULAR | Status: AC
  Filled 2011-06-17: qty 2

## 2011-06-17 MED ORDER — NEOSTIGMINE METHYLSULFATE 1 MG/ML IJ SOLN
INTRAMUSCULAR | Status: DC | PRN
  Administered 2011-06-17: 4 mg via INTRAMUSCULAR

## 2011-06-17 MED ORDER — EPHEDRINE SULFATE 50 MG/ML IJ SOLN
INTRAMUSCULAR | Status: DC | PRN
  Administered 2011-06-17: 10 mg via INTRAVENOUS

## 2011-06-17 MED ORDER — METOCLOPRAMIDE HCL 10 MG PO TABS: 10.0000 mg | ORAL_TABLET | Freq: Once | ORAL | Status: DC | PRN

## 2011-06-17 MED ORDER — GLYCOPYRROLATE 0.2 MG/ML IJ SOLN
INTRAMUSCULAR | Status: DC | PRN
  Administered 2011-06-17: 0.2 mg via INTRAVENOUS
  Administered 2011-06-17: .8 mg via INTRAVENOUS

## 2011-06-17 MED ORDER — CITRIC ACID-SODIUM CITRATE 334-500 MG/5ML PO SOLN: 30.0000 mL | Freq: Once | ORAL | Status: DC | PRN

## 2011-06-17 MED ORDER — METOCLOPRAMIDE HCL 5 MG/ML IJ SOLN: 10.0000 mg | Freq: Once | INTRAMUSCULAR | Status: DC | PRN

## 2011-06-17 MED ORDER — SODIUM CHLORIDE 0.9 % IJ SOLN: 9.0000 mL | INTRAMUSCULAR | Status: DC | PRN

## 2011-06-17 MED ORDER — VENLAFAXINE HCL ER 150 MG PO CP24
300.0000 mg | ORAL_CAPSULE | Freq: Every day | ORAL | Status: DC
  Administered 2011-06-18: 300 mg via ORAL
  Filled 2011-06-17 (×4): qty 2

## 2011-06-17 MED ORDER — DOCUSATE SODIUM 100 MG PO CAPS
100.0000 mg | ORAL_CAPSULE | Freq: Every day | ORAL | Status: DC
  Administered 2011-06-18: 100 mg via ORAL
  Filled 2011-06-17: qty 1

## 2011-06-17 MED ORDER — NALOXONE HCL 0.4 MG/ML IJ SOLN: 0.4000 mg | INTRAMUSCULAR | Status: DC | PRN

## 2011-06-17 MED ORDER — PROPOFOL 10 MG/ML IV EMUL
INTRAVENOUS | Status: AC
  Filled 2011-06-17: qty 20

## 2011-06-17 MED ORDER — OXYCODONE-ACETAMINOPHEN 5-325 MG PO TABS
1.0000 | ORAL_TABLET | ORAL | Status: DC | PRN
  Administered 2011-06-18 – 2011-06-19 (×4): 2 via ORAL
  Filled 2011-06-17 (×4): qty 2

## 2011-06-17 MED ORDER — SODIUM CHLORIDE 0.9 % IJ SOLN
9.0000 mL | INTRAMUSCULAR | Status: DC | PRN
  Administered 2011-06-18: 3 mL via INTRAVENOUS

## 2011-06-17 MED ORDER — GLYCOPYRROLATE 0.2 MG/ML IJ SOLN
INTRAMUSCULAR | Status: AC
  Filled 2011-06-17: qty 1

## 2011-06-17 MED ORDER — FAMOTIDINE 20 MG PO TABS: 20.0000 mg | ORAL_TABLET | Freq: Once | ORAL | Status: DC | PRN

## 2011-06-17 SURGICAL SUPPLY — 54 items
BLADE SURG 10 STRL SS (BLADE) ×4 IMPLANT
BLADE SURG 11 STRL SS (BLADE) ×4 IMPLANT
CANISTER SUCTION 2500CC (MISCELLANEOUS) ×4 IMPLANT
CATH FOLEY 2WAY SLVR  5CC 18FR (CATHETERS) ×1
CATH FOLEY 2WAY SLVR 5CC 18FR (CATHETERS) ×3 IMPLANT
CATH ROBINSON RED A/P 16FR (CATHETERS) IMPLANT
CLOTH BEACON ORANGE TIMEOUT ST (SAFETY) ×4 IMPLANT
CONT PATH 16OZ SNAP LID 3702 (MISCELLANEOUS) ×4 IMPLANT
COVER TABLE BACK 60X90 (DRAPES) ×6 IMPLANT
DECANTER SPIKE VIAL GLASS SM (MISCELLANEOUS) ×2 IMPLANT
DERMABOND ADVANCED (GAUZE/BANDAGES/DRESSINGS) ×4 IMPLANT
DRAPE CAMERA CLOSED 9X96 (DRAPES) ×4 IMPLANT
DRAPE HYSTEROSCOPY (DRAPE) ×4 IMPLANT
DRAPE UTILITY XL STRL (DRAPES) ×2 IMPLANT
DRSG COVADERM 4X10 (GAUZE/BANDAGES/DRESSINGS) ×2 IMPLANT
ELECT REM PT RETURN 9FT ADLT (ELECTROSURGICAL)
ELECTRODE REM PT RTRN 9FT ADLT (ELECTROSURGICAL) IMPLANT
FILTER SMOKE EVAC LAPAROSHD (FILTER) ×4 IMPLANT
FORCEPS CUTTING 33CM 5MM (CUTTING FORCEPS) ×2 IMPLANT
GAUZE PACKING 2X5 YD STERILE (GAUZE/BANDAGES/DRESSINGS) ×2 IMPLANT
GLOVE BIO SURGEON STRL SZ 6.5 (GLOVE) ×16 IMPLANT
GLOVE BIO SURGEON STRL SZ8 (GLOVE) IMPLANT
GOWN PREVENTION PLUS LG XLONG (DISPOSABLE) ×16 IMPLANT
NEEDLE HYPO 22GX1.5 SAFETY (NEEDLE) ×4 IMPLANT
NS IRRIG 1000ML POUR BTL (IV SOLUTION) ×4 IMPLANT
PACK LAVH (CUSTOM PROCEDURE TRAY) ×4 IMPLANT
PACK VAGINAL WOMENS (CUSTOM PROCEDURE TRAY) ×2 IMPLANT
PLUG CATH AND CAP STER (CATHETERS) ×4 IMPLANT
SCISSORS LAP 5X35 DISP (ENDOMECHANICALS) ×2 IMPLANT
SET CYSTO W/LG BORE CLAMP LF (SET/KITS/TRAYS/PACK) ×4 IMPLANT
SET IRRIG TUBING LAPAROSCOPIC (IRRIGATION / IRRIGATOR) ×2 IMPLANT
SOLUTION ELECTROLUBE (MISCELLANEOUS) IMPLANT
SPONGE LAP 18X18 X RAY DECT (DISPOSABLE) ×8 IMPLANT
STRIP CLOSURE SKIN 1/4X3 (GAUZE/BANDAGES/DRESSINGS) IMPLANT
SURGIFLO TRUKIT (HEMOSTASIS) IMPLANT
SUT VIC AB 0 CT1 18XCR BRD8 (SUTURE) ×11 IMPLANT
SUT VIC AB 0 CT1 27 (SUTURE) ×12
SUT VIC AB 0 CT1 27XBRD ANBCTR (SUTURE) ×7 IMPLANT
SUT VIC AB 0 CT1 36 (SUTURE) ×8 IMPLANT
SUT VIC AB 0 CT1 8-18 (SUTURE) ×20
SUT VIC AB 2-0 CT2 27 (SUTURE) ×4 IMPLANT
SUT VIC AB 2-0 SH 27 (SUTURE) ×8
SUT VIC AB 2-0 SH 27XBRD (SUTURE) ×6 IMPLANT
SUT VICRYL 0 TIES 12 18 (SUTURE) ×4 IMPLANT
SUT VICRYL 4-0 PS2 18IN ABS (SUTURE) ×4 IMPLANT
TIP UTERINE 5.1X6CM LAV DISP (MISCELLANEOUS) ×2 IMPLANT
TIP UTERINE 6.7X10CM GRN DISP (MISCELLANEOUS) ×2 IMPLANT
TIP UTERINE 6.7X6CM WHT DISP (MISCELLANEOUS) ×2 IMPLANT
TIP UTERINE 6.7X8CM BLUE DISP (MISCELLANEOUS) ×2 IMPLANT
TOWEL OR 17X24 6PK STRL BLUE (TOWEL DISPOSABLE) ×12 IMPLANT
TRAY FOLEY CATH 16FR SILVER (SET/KITS/TRAYS/PACK) ×4 IMPLANT
TUBING CONNECTING 10 (TUBING) ×2 IMPLANT
WARMER LAPAROSCOPE (MISCELLANEOUS) ×4 IMPLANT
WATER STERILE IRR 1000ML POUR (IV SOLUTION) ×4 IMPLANT

## 2011-06-17 NOTE — Anesthesia Postprocedure Evaluation (Signed)
  Anesthesia Post-op Note  Patient: Cynthia [name]  Procedure(s) Performed:  LAPAROSCOPIC ASSISTED VAGINAL HYSTERECTOMY - Attempted ; SALPINGO OOPHERECTOMY; HYSTERECTOMY ABDOMINAL - Converted to Abdominal Hysterectomy with lysis of adhesions   Patient Location: [facility]  Anesthesia Type: General  Level of Consciousness: awake, alert  and oriented  Airway and Oxygen Therapy: Patient Spontanous Breathing and Patient connected to nasal cannula oxygen  Post-op Pain: mild  Post-op Assessment: Patient's Cardiovascular Status Stable and Respiratory Function Stable  Post-op Vital Signs: Reviewed  Complications: No apparent anesthesia complications

## 2011-06-17 NOTE — H&P (Signed)
NAME:  Cynthia [name], Cynthia [name]                  ACCOUNT NO.:      MEDICAL RECORD NO.:  [id number]      LOCATION:                                 FACILITY:      PHYSICIAN:  Lenard Galloway, M.D.   DATE OF BIRTH:  May 10, 1961      DATE OF ADMISSION:   DATE OF DISCHARGE:                                 HISTORY & PHYSICAL         CHIEF COMPLAINT:  Heavy vaginal bleeding.      HISTORY OF PRESENT ILLNESS:  The patient is a 50 year old gravida 2,   para 1-0-1-1 Caucasian female who presented in May 2012, reporting a   history of heavy menstrual bleeding for the prior 5-6 months.  The   patient was also experiencing prolonged episodes of bleeding and   clotting with staining through her clothing.  The patient also reports   cramping with her menstruation.  The patient subsequently underwent a   pelvic ultrasound on Mar 24, 2011, which documented an endometrial   stripe of 6.55 mm and the presence of 2 intramural fibroids, one   measuring 1.1 cm and the other measuring 8.3 mm.  The left ovary was   normal and the right ovary was not seen.  Endometrial biopsy on Mar 24, 2011, documented generating secretory type endometrium.  The patient was   subsequently treated with Provera therapy.  She has continued to have   prolonged spotting.      The patient is interested in surgical treatment for her bleeding.      The patient currently reports leakage of urine with coughing, sneezing,   and laughing.  She requires the use of a protective pad.  Multichannel   urodynamic testing in the office confirmed the presence of genuine   stress incontinence with a leak point pressure of 220 cm of water.      With the patient's recent Pap smear on April 16, 2011, she was noted to   have low-grade squamous intraepithelial lesion.  She subsequently   underwent satisfactory colposcopy on May 21, 2011, which showed   koilocytic atypia, consistent with HPV changes.  The endocervical   curetting was benign.      The  patient requests hysterectomy with removal of ovaries and surgical   treatment of her urinary incontinence.      PAST OBSTETRIC AND GYNECOLOGIC HISTORIES:  Status post vaginal delivery   x1.  Status post VIP x1.  The patient does report a history of abnormal   Pap smears in the past and colposcopy prior to initiation of her care at   [facility].  She has never had any treatment for abnormal Pap   smears.  Last mammogram April 16, 2011, was within normal limits.      PAST MEDICAL HISTORY:   1. Hypertension.   2. Migraine headaches.   3. History of anemia.   4. Anxiety and depression.   5. Gastroesophageal reflux disease.   6. Mitral valve prolapse.   7. History of urinary tract infections.   8. Tobacco use.  The patient smokes 1/2 package of cigarettes per day.   9. History of sinus infection and bronchitis.  The patient was       recently treated at the end of July 2012.      PAST SURGICAL HISTORY:   1. Status post left knee surgery in 2010.   2. Status post tonsillectomy as a child.      ALLERGIES:  THIMEROSAL.  IODINE is noted as an allergy on chart review.      MEDICATIONS:   1. Effexor 150 mg p.o. b.i.d.   2. Atenolol 50 mg p.o. daily.   3. Nexium 40 mg p.o. daily.   4. Imitrex p.r.n.   5. Flexeril p.r.n.   6. Mirapex at bedtime.      SOCIAL HISTORY:  The patient is a widow.  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  She smokes 1/2 package of cigarettes per day.  She   reports the use of alcohol.  She denies the use of illicit drugs.      REVIEW OF SYSTEMS:  The patient reports occasional hot flashes.      FAMILY HISTORY:  Negative for breast, ovarian, uterine, and colon   cancer.      PHYSICAL EXAMINATION:  VITAL SIGNS:  Height 5 feet 6 inches, weight 246   pounds, blood pressure 130/70.   HEENT:  Normocephalic, atraumatic.   HEART:  S1 and S2 with a regular rate and rhythm.   LUNGS:  Clear to auscultation bilaterally.   ABDOMEN:  Soft and  nontender without evidence of hepatosplenomegaly or   organomegaly.   PELVIC:  Normal external genitalia and urethra.  The cervix and vagina   demonstrate no lesions.  There is first-degree uterine prolapse.  The   uterus is small and nontender.  No adnexal masses nor tenderness are   appreciated.      PREOPERATIVE LABORATORY STUDIES:  Positive for MRSA.  The patient will   be treated with mupirocin.  Hypokalemia with a potassium level of 2.8.      IMPRESSION:   1. The patient is a 50 year old para 27 female with menometrorrhagia,       incomplete uterovaginal prolapse, and genuine stress incontinence.       The plan will be to undergo a laparoscopically assisted vaginal       hysterectomy with bilateral salpingo-oophorectomy, TVT Exact       midurethral sling and cystoscopy at the [facility] on June 17, 2011.  Risks, benefits, and alternatives       have been reviewed with the patient who wishes to proceed.   2. Carrier for MRSA.  The patient will be treated with mupirocin       preoperatively.   3. Hypokalemia.  The patient was given a prescription for potassium       chloride 20 mEq 1 p.o. b.i.d.  Her potassium level will be       rechecked preoperatively.               Lenard Galloway, M.D.               BES/MEDQ  D:  06/16/2011  T:  06/16/2011  Job:  [id number]

## 2011-06-17 NOTE — Transfer of Care (Signed)
Immediate Anesthesia Transfer of Care Note  Patient: Cynthia [name]  Procedure(s) Performed:  LAPAROSCOPIC ASSISTED VAGINAL HYSTERECTOMY - Attempted ; SALPINGO OOPHERECTOMY; HYSTERECTOMY ABDOMINAL - Converted to Abdominal Hysterectomy with lysis of adhesions   Patient Location: PACU  Anesthesia Type: General  Level of Consciousness: awake and responds to stimulation  Airway & Oxygen Therapy: Patient Spontanous Breathing and Patient connected to nasal cannula oxygen  Post-op Assessment: Report given to PACU RN and Post -op Vital signs reviewed and stable  Post vital signs: Reviewed and stable  Complications: No apparent anesthesia complications

## 2011-06-17 NOTE — Anesthesia Procedure Notes (Signed)
Procedure Name: Intubation Date/Time: 06/17/2011 7:50 AM Performed by: Madison Hickman Pre-anesthesia Checklist: Patient identified, Emergency Drugs available, Suction available, Patient being monitored and Timeout performed Patient Re-evaluated:Patient Re-evaluated prior to inductionOxygen Delivery Method: Circle System Utilized Preoxygenation: Pre-oxygenation with 100% oxygen Intubation Type: IV induction and Inhalational induction Ventilation: Mask ventilation without difficulty Laryngoscope Size: Mac and 3 Grade View: Grade III Tube size: 7.0 mm Number of attempts: 2 Airway Equipment and Method: patient positioned with wedge pillow,  video-laryngoscopy and stylet Placement Confirmation: ETT inserted through vocal cords under direct vision,  positive ETCO2 and breath sounds checked- equal and bilateral Secured at: 20 cm Tube secured with: Tape Dental Injury: Teeth and Oropharynx as per pre-operative assessment  Difficulty Due To: Difficult Airway- due to limited oral opening

## 2011-06-17 NOTE — Anesthesia Postprocedure Evaluation (Signed)
  Anesthesia Post-op Note  Patient: Cynthia [name]  Procedure(s) Performed:  LAPAROSCOPIC ASSISTED VAGINAL HYSTERECTOMY - Attempted ; SALPINGO OOPHERECTOMY; HYSTERECTOMY ABDOMINAL - Converted to Abdominal Hysterectomy with lysis of adhesions   Patient Location: PACU  Anesthesia Type: General  Level of Consciousness: awake, alert  and oriented  Airway and Oxygen Therapy: Patient Spontanous Breathing  Post-op Pain: none  Post-op Assessment: Post-op Vital signs reviewed, Patient's Cardiovascular Status Stable, Respiratory Function Stable, Patent Airway, No signs of Nausea or vomiting and Pain level controlled  Post-op Vital Signs: Reviewed and stable  Complications: No apparent anesthesia complications

## 2011-06-17 NOTE — Progress Notes (Signed)
Encounter addended by: Fanny Dance on: 06/17/2011  6:02 PM<BR>     Documentation filed: Notes Section

## 2011-06-17 NOTE — Op Note (Signed)
Cynthia [name], Cynthia [name]             ACCOUNT NO.:  [id number]  MEDICAL RECORD NO.:  [id number]  LOCATION:  9312                          FACILITY:  [facility]  PHYSICIAN:  Lenard Galloway, M.D.   DATE OF BIRTH:  [birth year]  DATE OF PROCEDURE:  06/17/2011 DATE OF DISCHARGE:                              OPERATIVE REPORT   PREOPERATIVE DIAGNOSES: 1. Incomplete uterovaginal prolapse. 2. Menorrhagia. 3. Cervical atypia. 4. Urinary stress incontinence.  POSTOPERATIVE DIAGNOSES: 1. Incomplete uterovaginal prolapse. 2. Menorrhagia. 3. Cervical atypia. 4. Stress urinary incontinence. 5. Pelvic and bowel adhesions. 6. Cecal mass.  PROCEDURE:  Laparoscopy with lysis of adhesions, conversion to total abdominal hysterectomy with bilateral salpingo-oophorectomy and lysis of adhesions.  SURGEON:  Lenard Galloway, MD  ASSISTANT:  Gus Height, MD  ANESTHESIA:  General endotracheal.  IV FLUIDS:  3000 mL Ringer lactate.  ESTIMATED BLOOD LOSS:  600 mL.  URINE OUTPUT:  400 mL.  COMPLICATIONS:  None.  INDICATIONS FOR PROCEDURE:  The patient is a 50 year old para 60 Caucasian female who presented with heavy and irregular menstrual bleeding in May 2012.  The patient underwent evaluation and had a benign endometrial biopsy and an ultrasound documenting 2 very small intramural fibroids.  The right ovary was not visualized, and the left ovary appeared to be normal.  The patient was treated with some Provera therapy.  Her bleeding and spotting continued.  The patient also reported urinary incontinence with stressful maneuvers and she had multichannel urodynamic testing performed in the office documenting the presence of genuine stress incontinence.  The patient was noted to have first-degree uterine prolapse on physical examination.  A Pap smear prior to surgery was abnormal and the patient therefore underwent colposcopic evaluation.  This was satisfactory and demonstrated cervical atypia.  A plan  is made to proceed with a laparoscopically-assisted vaginal hysterectomy with bilateral salpingo-oophorectomy along with a TVT, mid urethral sling, and cystoscopy.  Risks, benefits, and alternatives were reviewed with the patient who wishes to proceed.  FINDINGS:  Laparoscopy demonstrated obliteration of the cul-de-sac due to multiple filmy adhesions between the bowel and the adnexal regions and the posterior uterus.  It was difficult to elevate the bowel out of the cul-de-sac for the procedure to be performed laparoscopically.  At the time of laparotomy, again the pelvic adhesions were confirmed. The right ovary was adherent to the right uterine fundus and pelvic sidewall.  Exploration into the upper abdomen revealed a firm mass in the cecum and multiple adhesions surrounding the mass.  After the pelvic anatomy was restored the uterus, tubes, and ovaries appeared to be unremarkable.  The cervix demonstrated no gross lesions.  SPECIMEN:  The uterine fundus with tubes and ovaries was removed as one specimen and the cervix was removed as another specimen.  All of these specimens were sent to Pathology together.  Intraoperative consultation request by General Surgery was undertaken. The general surgeon on-call was in the operating room and unavailable to come for consultation.  No one from the remaining General Surgery team was available to present to the operating room for intraoperative evaluation of the mass.  This evaluation will be completed postoperatively.  PROCEDURE:  The patient was reidentified in  the preoperative hold area. The patient received PAS stockings for DVT prophylaxis.  She did receive cefotetan for IV antibiotic prophylaxis.  In the operating room, the patient was placed in the supine position and she received general endotracheal anesthesia.  The patient was then placed in the dorsal lithotomy position with her arms at her sides.  The abdomen and vagina were  sterilely prepped and draped.  A speculum was placed inside the vagina and a single-tooth tenaculum was placed on the anterior cervical lip.  This was replaced with a Hulka tenaculum.  A Foley catheter was then placed inside the bladder.  The procedure began by creating a 1-cm umbilical incision with a scalpel.  Dissection down to the fascia was performed with an Allis clamp.  A 10-mm trocar was inserted directly into the peritoneal cavity and the laparoscope confirmed proper placement.  A CO2 pneumoperitoneum was achieved, and the patient was then placed in the Trendelenburg position.  A 5-mm right and left lower quadrant incisions were then created with a scalpel and 5-mm trocars were placed under direct visualization of the laparoscope.  Sharp dissection of some of the filmy adhesions was performed in the pelvis.  After it became clear that there was not going to be adequate access to the uterus and the adnexal regions, conversion was performed to a laparotomy with a Pfannenstiel incision after all of the laparoscopic instruments were removed. Dissection through the Pfannenstiel incision was performed with monopolar cautery through the subcutaneous layer until the fascia was reached.  The fascia was then incised transversely with a scalpel and the incision was extended bilaterally with the Mayo scissors.  Sharp dissection was used to dissect the fascia off of the underlying rectus muscles.  The rectus muscles were already divided naturally in the midline.  The parietal peritoneum was elevated with two hemostat clamps and was entered with a combination of sharp and blunt dissection.  Bowel was retracted into the upper abdomen in order to gain access to the pelvis to lyse adhesions of small bowel to the uterus, tubes and ovaries in order to then be able to place a Presenter, broadcasting.  Prior to placement of the retractor, the cecal area was examined and the mass was appreciated.  After  the self-retaining retractor was then placed, the pelvis was available for completion of the hysterectomy procedure.  Each of the round ligaments were grasped with a Babcock clamp and were suture ligated with transfixing sutures of 0 Vicryl.  Each of the round ligaments were then divided with monopolar cautery.  The broad ligaments were each opened posteriorly and anteriorly.  Each of the infundibulopelvic ligaments were then clamped after the ureters were identified bilaterally.  The infundibulopelvic ligaments were then sharply divided with a free tie of 0 Vicryl on the left followed by suture ligature of the same and then on the right-hand side to free ties of 0 Vicryl were placed.  Clamps were placed along the adnexal structures to try to elevate the uterus.  The bladder flap was created sharply.  Each of the uterine arteries were skeletonized.  The uterine arteries were then clamped, sharply divided, and suture ligated with 0 Vicryl bilaterally.  There appeared to be scar tissue in the anterior cul-de-sac, and the dissection of the vesicouterine fold was therefore performed with care.  The rectum was drawn up onto the cervix posteriorly and an intra fascial dissection was performed in the rectovaginal space in order to be able to gain access to the  utero sacral ligaments.  Each of these ligaments were then clamped, sharply divided, and suture ligated with transfixing sutures of 0 Vicryl.  Monopolar cautery was used at this time to amputate the uterine fundus from the cervix.  The remaining aspect of the cardinal ligaments were then bilaterally clamped, sharply divided, and suture ligated with 0 Vicryl again.  Eventually entry was possible into the vagina on the patient's left-hand side and the remaining cervix was then circumscribed with a Jorgenson scissors.  The specimens were all sent to Pathology.  Modified Richardson angle sutures of 0 Vicryl were placed bilaterally. The  remaining vaginal cuff was then closed with figure-of-eight sutures of 0 Vicryl.  The pelvis was irrigated and suctioned.  There was a small amount of bleeding noted near the patient's uterosacral ligament on the right-hand side and a figure-of-eight sutures of 2-0 Vicryl created hemostasis.  The General Surgery consultation was called as the hysterectomy was being completed.  Please refer to the note above.  Exploration into the space of Retzius was performed to see if the Burch procedure could be accomplished easily.  There was some bleeding along the pyramidalis muscles and the space was noted to be very deep and a decision was made not to proceed with a Burch procedure nor with the TVT based on some of the bleeding encountered in the space of Retzius and due to the findings of the cecal mass.  The abdomen was therefore closed at this time.  The parietal peritoneum was closed with a running suture of 2-0 Vicryl.  The fascia was closed with a running suture of 0 Vicryl.  The subcutaneous layer was irrigated suctioned made hemostatic with monopolar cautery and then closed with interrupted sutures of 2-0 plain gut suture.  The skin was closed with staples along the Pfannenstiel incision.  The umbilical incision was closed with inverted subcuticular suture of 2- 0 Vicryl.  All of the laparoscopic incisions were closed with Dermabond. A sterile bandage was placed over the Pfannenstiel incision.  The patient was awakened, extubated, and escorted to the recovery room in stable condition.  There were no complications to the procedure.  All needle, instrument, sponge counts were correct.     Lenard Galloway, M.D.     BES/MEDQ  D:  06/17/2011  T:  06/17/2011  Job:  [id number]

## 2011-06-17 NOTE — Progress Notes (Signed)
Pre-op Update Note  Potassium level pending.   Plan is to do a laparoscopically assisted vaginal hysterectomy, bilateral salpingo-oophorectomy, TVT sling and cystoscopy.

## 2011-06-17 NOTE — Anesthesia Postprocedure Evaluation (Deleted)
  Anesthesia Post-op Note  Patient: Cynthia [name]  Procedure(s) Performed:  LAPAROSCOPIC ASSISTED VAGINAL HYSTERECTOMY - Attempted ; SALPINGO OOPHERECTOMY; HYSTERECTOMY ABDOMINAL - Converted to Abdominal Hysterectomy with lysis of adhesions   Patient Location: PACU  Anesthesia Type: General  Level of Consciousness: awake, alert , oriented and sedated  Airway and Oxygen Therapy: Patient Spontanous Breathing  Post-op Pain: 4 /10  Post-op Assessment: Post-op Vital signs reviewed, Patient's Cardiovascular Status Stable, Respiratory Function Stable, Patent Airway, No signs of Nausea or vomiting and Pain level controlled  Post-op Vital Signs: Reviewed and stable  Complications: No apparent anesthesia complications

## 2011-06-17 NOTE — Progress Notes (Signed)
Post Op Check  S -  Patient comfortable. Hungry. O -  AVSS  OU 1600 cc  Abdomen - Absent bowel sounds, soft, non tender. Lapaorscopic incisions intact.  Pfannenstiel dressing dry.  Vaginal pad - Dry.  Chart review of CT scan in May 2011 - possible enteritis. A/P-  1. Stable post op.  Cecal mass and adhesions noted intra-op.  - Morphine PCA.  - Foley overnight.  - CBC, CMP in am.  - I have discussed with the patient the surgical findings and procedure and that I did not do an incontinence surgery for her and why.  - Patient will have a CT scan of the abdomen and pelvic when able to tolerate contrast.  - Will then have appropriate consultation/follow up with either general surgeon or gastroenterologist.

## 2011-06-17 NOTE — H&P (Signed)
Cynthia [name], Cynthia [name]             ACCOUNT NO.:  [id number]  MEDICAL RECORD NO.:  [id number]  LOCATION:                                 FACILITY:  PHYSICIAN:  Lenard Galloway, M.D.   DATE OF BIRTH:  03/27/1961  DATE OF ADMISSION:  06/17/2011 DATE OF DISCHARGE:                             HISTORY & PHYSICAL   CHIEF COMPLAINT:  Heavy vaginal bleeding.  HISTORY OF PRESENT ILLNESS:  The patient is a 50 year old gravida 2, para 1-0-1-1 Caucasian female who presented in May 2012, reporting a history of heavy menstrual bleeding for the prior 5-6 months.  The patient was also experiencing prolonged episodes of bleeding and clotting with staining through her clothing.  The patient also reports cramping with her menstruation.  The patient subsequently underwent a pelvic ultrasound on Mar 24, 2011, which documented an endometrial stripe of 6.55 mm and the presence of 2 intramural fibroids, one measuring 1.1 cm and the other measuring 8.3 mm.  The left ovary was normal and the right ovary was not seen.  Endometrial biopsy on Mar 24, 2011, documented generating secretory type endometrium.  The patient was subsequently treated with Provera therapy.  She has continued to have prolonged spotting.  The patient is interested in surgical treatment for her bleeding.  The patient currently reports leakage of urine with coughing, sneezing, and laughing.  She requires the use of a protective pad.  Multichannel urodynamic testing in the office confirmed the presence of genuine stress incontinence with a leak point pressure of 220 cm of water.  With the patient's recent Pap smear on April 16, 2011, she was noted to have low-grade squamous intraepithelial lesion.  She subsequently underwent satisfactory colposcopy on May 21, 2011, which showed koilocytic atypia, consistent with HPV changes.  The endocervical curetting was benign.  The patient requests hysterectomy with removal of ovaries and  surgical treatment of her urinary incontinence.  PAST OBSTETRIC AND GYNECOLOGIC HISTORIES:  Status post vaginal delivery x1.  Status post VIP x1.  The patient does report a history of abnormal Pap smears in the past and colposcopy prior to initiation of her care at [facility].  She has never had any treatment for abnormal Pap smears.  Last mammogram April 16, 2011, was within normal limits.  PAST MEDICAL HISTORY: 1. Hypertension. 2. Migraine headaches. 3. History of anemia. 4. Anxiety and depression. 5. Gastroesophageal reflux disease. 6. Mitral valve prolapse. 7. History of urinary tract infections. 8. Tobacco use.  The patient smokes 1/2 package of cigarettes per day. 9. History of sinus infection and bronchitis.  The patient was     recently treated at the end of July 2012.  PAST SURGICAL HISTORY: 1. Status post left knee surgery in 2010. 2. Status post tonsillectomy as a child.  ALLERGIES:  THIMEROSAL.  IODINE is noted as an allergy on chart review.  MEDICATIONS: 1. Effexor 150 mg p.o. b.i.d. 2. Atenolol 50 mg p.o. daily. 3. Nexium 40 mg p.o. daily. 4. Imitrex p.r.n. 5. Flexeril p.r.n. 6. Mirapex at bedtime.  SOCIAL HISTORY:  The patient is a widow.  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  She smokes 1/2 package of cigarettes  per day.  She reports the use of alcohol.  She denies the use of illicit drugs.  REVIEW OF SYSTEMS:  The patient reports occasional hot flashes.  FAMILY HISTORY:  Negative for breast, ovarian, uterine, and colon cancer.  PHYSICAL EXAMINATION:  VITAL SIGNS:  Height 5 feet 6 inches, weight 246 pounds, blood pressure 130/70. HEENT:  Normocephalic, atraumatic. HEART:  S1 and S2 with a regular rate and rhythm. LUNGS:  Clear to auscultation bilaterally. ABDOMEN:  Soft and nontender without evidence of hepatosplenomegaly or organomegaly. PELVIC:  Normal external genitalia and urethra.  The cervix and vagina demonstrate no  lesions.  There is first-degree uterine prolapse.  The uterus is small and nontender.  No adnexal masses nor tenderness are appreciated.  PREOPERATIVE LABORATORY STUDIES:  Hypokalemia with a potassium level of 2.8.  IMPRESSION: 1. The patient is a 50 year old para 30 female with menometrorrhagia,     incomplete uterovaginal prolapse, and genuine stress incontinence.     The plan will be to undergo a laparoscopically assisted vaginal     hysterectomy with bilateral salpingo-oophorectomy, TVT Exact     midurethral sling and cystoscopy at the [facility] on June 17, 2011.  Risks, benefits, and alternatives     have been reviewed with the patient who wishes to proceed. 2. Hypokalemia.  The patient was given a prescription for potassium     chloride 20 mEq 1 p.o. b.i.d.  Her potassium level will be     rechecked preoperatively.     Lenard Galloway, M.D.     BES/MEDQ  D:  06/16/2011  T:  06/16/2011  Job:  [id number]

## 2011-06-17 NOTE — Brief Op Note (Addendum)
06/17/2011  11:32 AM  PATIENT:  Cynthia [name]  50 y.o. female  PRE-OPERATIVE DIAGNOSIS:  Incomplete uterovaginal prolapse, menorrhagia, cervical atypia, stress urinary incontinence  POST-OPERATIVE DIAGNOSIS:  Incomplete uterovaginal prolapse, menorrhagia, cervical atypia, stress urinary incontinence, pelvic adhesions, cecal mass  PROCEDURE:  Procedure(s): LAPAROSCOPY WITH LYSIS OF ADHESIONS, TOTAL ABDOMINAL HYSTERECTOMY, BILATERAL SALPINGO OOPHERECTOMY LYSIS OF ADHESIONS  SURGEON: Josefa Half, MD  PHYSICIAN ASSISTANT:   ASSISTANTS: Gus Height, MD  ANESTHESIA:   general  ESTIMATED BLOOD LOSS: 600 cc   BLOOD ADMINISTERED:none  DRAINS: none   LOCAL MEDICATIONS USED:  NONE  SPECIMEN:  Source of Specimen:  Uterus, cervix, tubes, ovaries  DISPOSITION OF SPECIMEN:  PATHOLOGY  COUNTS:  YES  TOURNIQUET:  * No tourniquets in log *  DICTATION #:   PLAN OF CARE:   PATIENT DISPOSITION:  PACU - hemodynamically stable.   Delay start of Pharmacological VTE agent (>24hrs) due to surgical blood loss or risk of bleeding:  no

## 2011-06-18 ENCOUNTER — Inpatient Hospital Stay (HOSPITAL_COMMUNITY): Payer: BC Managed Care – PPO

## 2011-06-18 LAB — CBC
HCT: 35.4 % — ABNORMAL LOW (ref 36.0–46.0)
Hemoglobin: 11.1 g/dL — ABNORMAL LOW (ref 12.0–15.0)
MCH: 27.7 pg (ref 26.0–34.0)
MCHC: 31.4 g/dL (ref 30.0–36.0)
MCV: 88.3 fL (ref 78.0–100.0)
Platelets: 284 10*3/uL (ref 150–400)
RBC: 4.01 MIL/uL (ref 3.87–5.11)
RDW: 15.6 % — ABNORMAL HIGH (ref 11.5–15.5)
WBC: 12.8 10*3/uL — ABNORMAL HIGH (ref 4.0–10.5)

## 2011-06-18 LAB — COMPREHENSIVE METABOLIC PANEL
ALT: 13 U/L (ref 0–35)
AST: 21 U/L (ref 0–37)
Albumin: 2.8 g/dL — ABNORMAL LOW (ref 3.5–5.2)
Alkaline Phosphatase: 72 U/L (ref 39–117)
BUN: 12 mg/dL (ref 6–23)
CO2: 32 mEq/L (ref 19–32)
Calcium: 8.5 mg/dL (ref 8.4–10.5)
Chloride: 101 mEq/L (ref 96–112)
Creatinine, Ser: 0.88 mg/dL (ref 0.50–1.10)
GFR calc Af Amer: >60 mL/min (ref >60)
GFR calc non Af Amer: >60 mL/min (ref >60)
Glucose, Bld: 102 mg/dL — ABNORMAL HIGH (ref 70–99)
Potassium: 4.1 mEq/L (ref 3.5–5.1)
Sodium: 138 mEq/L (ref 135–145)
Total Bilirubin: 0.3 mg/dL (ref 0.3–1.2)
Total Protein: 6.1 g/dL (ref 6.0–8.3)

## 2011-06-18 MED ORDER — IOHEXOL 300 MG/ML  SOLN
100.0000 mL | Freq: Once | INTRAMUSCULAR | Status: AC | PRN
  Administered 2011-06-18: 100 mL via INTRAVENOUS

## 2011-06-18 NOTE — Progress Notes (Signed)
  Progress Note  CT of abdomen and pelvis with contrast - No soft tissue mass of the cecum.  - I discussed results with the patient by phone. - Will advance diet. - D/C PCA and start po pain meds. - Saline lock IV if tolerating po well.

## 2011-06-18 NOTE — Progress Notes (Signed)
POD #1  S- Foley out.  No nausea or vomiting.  No flatus.  Ambulated. O- T98.6, BP 115/73, P87, RR 20  UO 2050 cc   Lungs - CTA bilaterally.  Cor - S1S2 RRR.  Abdomen - Active bowel sounds, soft, nontender.  Dressing with minor spotting.                                 Laparoscopic incisions intact.  Vaginal pad -  Minor spotting.   Labs - WBC 12.8, Hgb 11.1, plts 138,000              Na 138, K 4.1, glucose 102, AST 21, ASL 13  A/P- 1. S/P laparoscopic LOA, conversion to laparotomy with total abdominal hysterectomy and bilateral salpingo-oophorectomy, POD #1.  - Ambulate.  - Clear liquid diet.  - Shower and remove dressing this afternoon.            2. Cecal mass noted intraoperatively.  - Plan for CT scan of abdomen and pelvic when able to tolerate contrast.

## 2011-06-18 NOTE — Progress Notes (Signed)
UR chart review completed.  

## 2011-06-19 ENCOUNTER — Encounter (HOSPITAL_COMMUNITY): Payer: Self-pay | Admitting: Obstetrics and Gynecology

## 2011-06-19 MED ORDER — IBUPROFEN 600 MG PO TABS: 600.0000 mg | ORAL_TABLET | Freq: Four times a day (QID) | ORAL | Status: AC | PRN

## 2011-06-19 MED ORDER — OXYCODONE-ACETAMINOPHEN 5-325 MG PO TABS: 1.0000 | ORAL_TABLET | ORAL | Status: AC | PRN

## 2011-06-19 NOTE — Discharge Summary (Signed)
Physician Discharge Summary  Patient ID: Cynthia [name] MRN: [id number] DOB/AGE: [birth year] 50 y.o.  Admit date: 06/17/2011 Discharge date: 06/19/2011  Admission Diagnoses: Menorrhagia, Cervical Atypia, Genuine Stress Incontinence.  Discharge Diagnoses: Menorrhagia, Cervical Atypia, Genuine Stress Incontinence.  Status post Laparoscopic Lysis of Adhesions, Laparotomy with Total Abdominal Hysterectomy/Bilateral Salpingo-oophorectomy/Lysis of Adhesions.  Normal CT scan of abdomen and pelvis. Active Problems:  * No active hospital problems. *    Discharged Condition: Good.  Hospital Course:  Patient had an uncomplicated surgery.  Intraoperatively, a cecal mass and adhesions were encountered.  A general surgery intraoperative consultation was requested, however no one was available to come to [facility] to render an opinion.  A plan was made to proceed with a post-op CT scan of the abdomen and pelvis, which was unremarkable and was negative for a cecal mass.  Dr. Johney Maine saw the patient on post op day number one after the CT results were available, and he recommended a GI consultation.  Patient has had an uneventful surgical recovery.  She has a clear understanding of the intraoperaive findings, the reason for not proceeding with the antiincontinence surgery, and the plan for further evaluation and care.    Consults: General Surgery - Dr. Johney Maine.   Significant Diagnostic Studies: CT scan of abdomen and pelvis.  Treatments: See above.  Discharge Exam: Blood pressure 110/70, pulse 84, temperature 97.8 F (36.6 C), temperature source Oral, resp. rate 18, last menstrual period 06/10/2011, SpO2 99.00%. Benign.  Please refer to the POD #2 note.  Disposition: Final discharge disposition not confirmed.  Patient will be discharged to home.  She will follow up in the office in 4 days for staple removal.  Discharge Orders    Future Orders Please Complete By Expires   Diet - low sodium  heart healthy      Increase activity slowly      Driving Restrictions      Comments:   No driving for 2 weeks.   Lifting restrictions      Comments:   No lifting over 10 pounds for 6 weeks.   Sexual Activity Restrictions      Comments:   No intercourse for 6 weeks.   Discharge wound care:      Comments:   Wash with antibacterial soap and water daily.   Call MD for:  temperature >100.4      Call MD for:  persistant nausea and vomiting      Call MD for:  severe uncontrolled pain      Call MD for:  redness, tenderness, or signs of infection (pain, swelling, redness, odor or green/yellow discharge around incision site)      Call MD for:  hives      Discharge instructions      Comments:   Call the office to have your staples removed on Monday.     Current Discharge Medication List    START taking these medications   Details  oxyCODONE-acetaminophen (PERCOCET) 5-325 MG per tablet Take 1-2 tablets by mouth every 4 (four) hours as needed (moderate to severe pain (when tolerating fluids)). Qty: 30 tablet, Refills: 0      CONTINUE these medications which have CHANGED   Details  ibuprofen (ADVIL,MOTRIN) 600 MG tablet Take 1 tablet (600 mg total) by mouth every 6 (six) hours as needed for pain (mild pain). Qty: 30 tablet, Refills: 0      CONTINUE these medications which have NOT CHANGED   Details  atenolol-chlorthalidone (TENORETIC) 50-25  MG per tablet Take 1 tablet by mouth daily.      esomeprazole (NEXIUM) 40 MG capsule Take 40 mg by mouth daily.     OVER THE COUNTER MEDICATION Place 3 tablets under the tongue at bedtime. Restfull Legs herbal supplement     Pramipexole Dihydrochloride (MIRAPEX PO) Take 1 tablet by mouth at bedtime. For restless legs.  Patient did not know dose.  Called pharmacy- has not gotten filled before    Pseudoeph-Doxylamine-DM-APAP (NYQUIL PO) Take 30 mLs by mouth at bedtime as needed. For sinus problems     venlafaxine (EFFEXOR-XR) 150 MG 24 hr capsule  Take 300 mg by mouth daily.      SUMAtriptan (IMITREX) 100 MG tablet Take 100 mg by mouth every 2 (two) hours as needed.           Signed: SILVA,BROOK A 06/19/2011, 8:00 AM

## 2011-06-19 NOTE — Progress Notes (Signed)
POD #2  S- Passing flatus.  Feels great.  Voiding.  Ambulating.  Good pain control.  Had General Surgery consultation yesterday with Dr. Johney Maine apparently.  Patient said he recommended a GI evaluation, based on the negative CT scan results. O- AVSS      Abdomen - positive bowel sounds, soft, nontender.  Incision - clean, dry, intact.  Dressing with minor spotting on it.      Vaginal pad - minimal spotting. A/P -1. Status post laparoscopy with LOA and laparotomy with TAH/BSO/LOA.  - Ready for D/C home.  - Rx percocet, ibuprophen.  - Resume usual meds, but hold on antiHTN med for 2 days.  - Regular diet.  - Decreased activity.  - F/U in 4 days for staple removal.           2. Intraoperative bowel adhesions and negative CT scan.    - Will arrange for patient to her her GI, Dr. Fuller Plan, as an outpatient.  This will be arranged through my office when she presents for staple removal.

## 2011-06-19 NOTE — Consult Note (Signed)
Cynthia [name], Cynthia [name]             ACCOUNT NO.:  [id number]  MEDICAL RECORD NO.:  [id number]  LOCATION:  3149                          FACILITY:  [facility]  PHYSICIAN:  Adin Hector, MD     DATE OF BIRTH:  12-24-1960  DATE OF CONSULTATION:  06/18/2011 DATE OF DISCHARGE:  06/10/2011                                CONSULTATION   REQUESTING PHYSICIAN:  Lenard Galloway, MD  PRIMARY CARE PHYSICIAN:  Jill Alexanders, MD  GASTROLOGIST:  Pricilla Riffle. Fuller Plan, MD, [facility]  REASON FOR CONSULT:  Cecal wall thickening, question etiology.  HISTORY OF PRESENT ILLNESS:  Cynthia [name] is a 50 year old obese female with stable gastroesophageal reflux disease who underwent hysterectomy yesterday.  Intraoperatively, Dr. Quincy Simmonds noted some thickening in the right lower quadrant at the cecum.  She was concerned.  She requested  intraoperative consultation.  Unfortunately, I was not able to come over secondary to already in the operating room at another hospital for several hours.  None of my partners were available.  Dr. Quincy Simmonds closed and requested surgical evaluation.  The patient had a CAT scan done today, which is not very definitive for any major pathology.  The patient seems to be recovering well at this point.  Three weeks ago, she had an episode of upper abdominal pain.  She does have a history of heartburn or reflux.  She present with Dr. Redmond School with this.  He doubled her proton pump inhibitor (Nexium) that seemed to help.  She notes she usually has a bowel movement everyday.  She occasionally will have a hard bowel movement or loose bowel movement, but no severe fluctuations.  She has a sister with irritable bowel syndrome recently diagnosed.  The patient denies that herself, although it is on her problem list.  No history of inflammatory bowel disease or ulcerative colitis.  No Crohn's.  No hematochezia or melena.  She normally has pretty good physical activity, good exercise tolerance.  She does smoke. No  history of strokes or heart attacks.  No history of C. diff colitis. No sick contacts or travel history.  PAST MEDICAL HISTORY: 1. Gastroesophageal reflux disease with hiatal hernia. 2. Question of irritable bowel syndrome. 3. Mitral valve prolapse. 4. Question of anxiety and depression. 5. Intermittent headaches.  PAST SURGICAL HISTORY:  She had tonsillectomy and knee arthroscopy.  She has postop day 1 from an abdominal hysterectomy.  SOCIAL HISTORY:  She smokes less than pack a day for several years.  No major alcohol intake.  No other drug use.  REVIEW OF SYSTEMS:  As noted per HPI.  GENERAL:  Her weight has been stable.  Has no fever, chills, or sweats.  Energy level was otherwise good, no fatigue.  EYES:  No vision changes or eye loss.  HEENT:  No hearing loss.  No mouth sores or ulcerations.  CARDIAC:  No exertional chest pain or shortness of breath.  RESPIRATORY:  No recent colds, coughs, or flus.  Otherwise, negative.  GI:  As noted above.  No hematochezia, melena, or hematemesis.  No dysphagia to solids nor liquids.  No significant reflux or bending over. She occasional sleeps propping up, but no foul breath or halitosis.  MUSCULOSKELETAL, HEME, LYMPH, ALLERGIC, PSYCH, DERMATOLOGIC, otherwise negative.  GYN:  As noted above.  No recurrent vaginal bleeding or discharge.  PHYSICAL EXAMINATION:  VITAL SIGNS:  Cannot obtain, but I am told by the nurse, they are in the normal range. GENERAL:  She is a well-developed, well-nourished, morbidly obese female, sitting up in the ED, in no acute distress. HEENT:  Eyes, pupils equal, round, reactive to light.  Extraocular movements intact.  No sclerae anicteric or injected.  Mucous membranes are moist.  Nasopharynx and oropharynx are clear. LUNGS:  Clear to auscultation bilaterally with no wheezes, rales, or rhonchi. SKIN:  No petechiae.  No purpura.  No other sores or lesions. LYMPH NODE:  Head and neck x-ray revealed  lymphadenopathy. ABDOMEN:  Obese, but soft.  No particular tenderness except for her lower abdominal hysterectomy incision.  Dressing intact clean and dry. VAGINAL:  Deferred. RECTAL:  Deferred. MUSCULOSKELETAL:  She has good range of motion at shoulders, elbows, and wrists as well as knees and ankles. PSYCH:  She is calm and relax.  No evidence of any dementia, delirium, psychosis, or paranoia.  STUDIES:  She had CT scan which shows no evidence of any bowel obstruction.  There are some postsurgical changes.  Her ascending colon is decompressed with a mildly thickened wall, but is probably physiologic just from the decompression itself.  I discussed this with Radiology.  Terminal ileum is not thickened.  There is no bowel obstruction.  Compared to the CT scan from last year, she had some maybe mild terminal ileal thickening, but her colon was normal.  She has had colonoscopy in June 2009 by Dr. Lucio Edward, which is completely normal.  No polyps or abnormalities or inflammation.  ASSESSMENT AND PLAN: 1. Morbidly obese female with incidentally noted thickening of the     cecal wall, but without any evidence of any mass, tumor, or major     inflammation  by CAT scan and a negative colonoscopy 3 years ago.     I am not certain what the significance of this is.  She has     never had any symptoms on the right side nor any bowel changes.     She has had some anemia, but the presumption has been this is     secondary to her gynecological issues.  At this point, I would recommend that she recover from surgery.  I recommend that she has a fiber regimen daily to keep her bowels regular.  I think it is a good idea to consider colonoscopy since it has been 3 years since the last one, and she has some new abnormalities.  Another option is to repeat a CAT scan in 3-6 months to see if anything develops.  I see no indication for any surgery at this time as there is no major mass, bleeding,  perforation, nor obstruction.  It is reasonable to follow this.  Hopefully, this is just sort of a physiological variant and     not any true pathology going on, but I do     not think it should be completely ignored without any followup.    I would refer to her primary MD, Dr. Redmond School, to see if he agrees.  I wonder if Dr. Fuller Plan should consider a colonoscopy or if this something that     can just be followed clinically.  The patient agrees with this     plan.  I gave her my card. 4. Stop smoking.  Adin Hector, MD     SCG/MEDQ  D:  06/18/2011  T:  06/18/2011  Job:  [id number]  cc:   Jill Alexanders, M.D. Fax: [phone]  Pricilla Riffle. Fuller Plan, MD, FACG [address] [place] [address]

## 2011-07-09 ENCOUNTER — Ambulatory Visit (INDEPENDENT_AMBULATORY_CARE_PROVIDER_SITE_OTHER): Payer: BC Managed Care – PPO | Admitting: Gastroenterology

## 2011-07-09 ENCOUNTER — Encounter: Payer: Self-pay | Admitting: Gastroenterology

## 2011-07-09 VITALS — BP 100/72 | HR 80 | Ht 64.0 in | Wt 236.8 lb

## 2011-07-09 DIAGNOSIS — R933 Abnormal findings on diagnostic imaging of other parts of digestive tract: Secondary | ICD-10-CM

## 2011-07-09 DIAGNOSIS — K219 Gastro-esophageal reflux disease without esophagitis: Secondary | ICD-10-CM

## 2011-07-09 MED ORDER — PEG-KCL-NACL-NASULF-NA ASC-C 100 G PO SOLR: 1.0000 | Freq: Once | ORAL | Status: DC

## 2011-07-09 NOTE — Progress Notes (Signed)
History of Present Illness: This is a 50 year old female who recently underwent a total hysterectomy by Dr. Edward Jolly on 8/14. At surgery an abnormality or mass in the cecum was palpated. Postoperatively she underwent a CT scan that did not show any cecal pathology. She previously had colonoscopy in June 2009 for IBS type symptoms without polyps or other lesions noted. She was seen in consultation by Dr. Karie Soda postoperatively. I reviewed the Dr. Michaell Cowing' consult note, the operative note, and her CT scan dated 8/15. Denies weight loss, abdominal pain, constipation, diarrhea, change in stool caliber, melena, hematochezia, nausea, vomiting, dysphagia, reflux symptoms, chest pain.   Current Medications, Allergies, Past Medical History, Past Surgical History, Family History and Social History were reviewed in [employer] record.  Physical Exam: General: Well developed , well nourished, overweight, no acute distress Head: Normocephalic and atraumatic Eyes:  sclerae anicteric, EOMI Ears: Normal auditory acuity Mouth: No deformity or lesions Lungs: Clear throughout to auscultation Heart: Regular rate and rhythm; no murmurs, rubs or bruits Abdomen: Soft, non tender and non distended. No masses, hepatosplenomegaly or hernias noted. Normal Bowel sounds Rectal: Deferred to colonoscopy Musculoskeletal: Symmetrical with no gross deformities  Pulses:  Normal pulses noted Extremities: No clubbing, cyanosis, edema or deformities noted Neurological: Alert oriented x 4, grossly nonfocal Psychological:  Alert and cooperative. Normal mood and affect  Assessment and Recommendations:  1. Abnormal cecum at surgery. I suspect this may have been stool or some other nonspecific process however we need to further exclude cecal lesions with colonoscopy. It is certainly reassuring that her CT scan did not show a lesion however this is not sensitive enough for smaller lesions in the colon. The risks,  benefits, and alternatives to colonoscopy with possible biopsy and possible polypectomy were discussed with the patient and they consent to proceed.   2. GERD. Continue Nexium 40 mg daily and antireflux measures.  3. IBS. Symptoms currently inactive.

## 2011-07-09 NOTE — Patient Instructions (Addendum)
You have been scheduled for a colonoscopy. Please follow written instructions given to you at your visit today. Please pick up your Moviprep at the pharmacy within the next 2-3 days.  cc: Conley Simmonds, MD       Karie Soda, MD

## 2011-07-11 ENCOUNTER — Encounter: Payer: Self-pay | Admitting: Gastroenterology

## 2011-07-11 ENCOUNTER — Ambulatory Visit (AMBULATORY_SURGERY_CENTER): Payer: BC Managed Care – PPO | Admitting: Gastroenterology

## 2011-07-11 DIAGNOSIS — D126 Benign neoplasm of colon, unspecified: Secondary | ICD-10-CM

## 2011-07-11 DIAGNOSIS — R933 Abnormal findings on diagnostic imaging of other parts of digestive tract: Secondary | ICD-10-CM

## 2011-07-11 MED ORDER — SODIUM CHLORIDE 0.9 % IV SOLN: 500.0000 mL | INTRAVENOUS | Status: DC

## 2011-07-11 NOTE — Patient Instructions (Signed)
1- POLYP REMOVED AND SENT TO PATHOLOGY  SEE BLUE AND GREEN SHEETS FOR ADDITIONAL D/C INSTRUCTIONS

## 2011-07-11 NOTE — Progress Notes (Signed)
Per the pt she had abdominal hysterectomy middle of August and her abdomen is a little tender still. MAW  Dr. Russella Dar and tech was made aware of this pre-procedure. MAW  Tech applied gentile abdominal pressure for a few second to aid scope advancement into the cecum.  MAW  No complaints noted.  MAW

## 2011-07-14 ENCOUNTER — Telehealth: Payer: Self-pay

## 2011-07-14 NOTE — Telephone Encounter (Signed)
Follow up Call- Patient questions:  Do you have a fever, pain , or abdominal swelling? no Pain Score  0 *  Have you tolerated food without any problems? yes  Have you been able to return to your normal activities? yes  Do you have any questions about your discharge instructions: Diet   no Medications  no Follow up visit  no  Do you have questions or concerns about your Care? no  Actions: * If pain score is 4 or above: No action needed, pain <4.  Per the pt "I did not have any problems", maw

## 2011-07-15 ENCOUNTER — Encounter: Payer: Self-pay | Admitting: Gastroenterology

## 2011-09-09 ENCOUNTER — Other Ambulatory Visit: Payer: Self-pay | Admitting: Family Medicine

## 2011-09-09 NOTE — Telephone Encounter (Signed)
mmm

## 2011-09-09 NOTE — Telephone Encounter (Signed)
Is it okay to refill these?

## 2011-09-09 NOTE — Telephone Encounter (Signed)
PT ONLY NEEDS EFFEXOR, NEXIUM, MIRAPEX FILL FOR 90 DAYS TO MAIL ORDER.  PT DOES NOT NEED ATENOLOL.  PT HAS APPT NEXT WEEK-LM

## 2011-09-09 NOTE — Telephone Encounter (Signed)
Pt doesn't need.

## 2011-09-09 NOTE — Telephone Encounter (Signed)
Cynthia [name] S/W PT & SHE MADE APPT & WILL CK BOTTLES TO SEE IF REFILLS ARE  NEEDED-LM

## 2011-09-18 ENCOUNTER — Encounter: Payer: Self-pay | Admitting: Family Medicine

## 2011-09-19 ENCOUNTER — Ambulatory Visit (INDEPENDENT_AMBULATORY_CARE_PROVIDER_SITE_OTHER): Payer: BC Managed Care – PPO | Admitting: Family Medicine

## 2011-09-19 ENCOUNTER — Encounter: Payer: Self-pay | Admitting: Family Medicine

## 2011-09-19 VITALS — BP 128/76 | HR 76 | Wt 238.0 lb

## 2011-09-19 DIAGNOSIS — E669 Obesity, unspecified: Secondary | ICD-10-CM | POA: Insufficient documentation

## 2011-09-19 DIAGNOSIS — K449 Diaphragmatic hernia without obstruction or gangrene: Secondary | ICD-10-CM

## 2011-09-19 DIAGNOSIS — I10 Essential (primary) hypertension: Secondary | ICD-10-CM | POA: Insufficient documentation

## 2011-09-19 DIAGNOSIS — Z23 Encounter for immunization: Secondary | ICD-10-CM

## 2011-09-19 DIAGNOSIS — K219 Gastro-esophageal reflux disease without esophagitis: Secondary | ICD-10-CM

## 2011-09-19 DIAGNOSIS — G43909 Migraine, unspecified, not intractable, without status migrainosus: Secondary | ICD-10-CM | POA: Insufficient documentation

## 2011-09-19 DIAGNOSIS — G2581 Restless legs syndrome: Secondary | ICD-10-CM

## 2011-09-19 DIAGNOSIS — D649 Anemia, unspecified: Secondary | ICD-10-CM

## 2011-09-19 DIAGNOSIS — Z79899 Other long term (current) drug therapy: Secondary | ICD-10-CM

## 2011-09-19 DIAGNOSIS — F17200 Nicotine dependence, unspecified, uncomplicated: Secondary | ICD-10-CM | POA: Insufficient documentation

## 2011-09-19 MED ORDER — ATENOLOL-CHLORTHALIDONE 50-25 MG PO TABS: 1.0000 | ORAL_TABLET | Freq: Every day | ORAL | Status: DC

## 2011-09-19 MED ORDER — ESOMEPRAZOLE MAGNESIUM 40 MG PO CPDR: 40.0000 mg | DELAYED_RELEASE_CAPSULE | Freq: Two times a day (BID) | ORAL | Status: DC

## 2011-09-19 MED ORDER — CYCLOBENZAPRINE HCL 10 MG PO TABS: 10.0000 mg | ORAL_TABLET | Freq: Three times a day (TID) | ORAL | Status: DC | PRN

## 2011-09-19 MED ORDER — VENLAFAXINE HCL ER 150 MG PO CP24: 150.0000 mg | ORAL_CAPSULE | Freq: Two times a day (BID) | ORAL | Status: DC

## 2011-09-19 MED ORDER — PRAMIPEXOLE DIHYDROCHLORIDE 0.5 MG PO TABS: 0.5000 mg | ORAL_TABLET | Freq: Every day | ORAL | Status: DC

## 2011-09-19 MED ORDER — SUMATRIPTAN SUCCINATE 100 MG PO TABS: 100.0000 mg | ORAL_TABLET | ORAL | Status: DC | PRN

## 2011-09-19 NOTE — Progress Notes (Signed)
  Subjective:    Patient ID: Cynthia [name], female    DOB: 1961/04/24, 50 y.o.   MRN: [id number]  HPI She is here for an interval evaluation. She states that the medication for her RLS is not working. She is also taking twice a day dosing of her GERD medicine to get this under good control. She does have migraine headaches and is on medications for this and would like refills. She also continues on her blood pressure medicine. She has a history of anemia but thinks this is secondary to menstrual bleeding and she has had a hysterectomy. Her IBS has not given her any troubles at the present time. She continues to smoke and at this time is not ready to quit. She recently lost her job but does have benefit coverage for over a year. She has been under a lot of stress over the last several years with the death of husband and boyfriend and no loss of her job. She seems to be handling this fairly well.   Review of Systems     Objective:   Physical Exam Alert and in no distress otherwise not examined      Assessment & Plan:   1. RLS (restless legs syndrome)  CBC with Differential, Comprehensive metabolic panel  2. GERD    3. HIATAL HERNIA    4. Migraine headache    5. Hypertension  CBC with Differential, Comprehensive metabolic panel  6. Anemia  CBC with Differential  7. Obesity (BMI 30-39.9)  CBC with Differential, Comprehensive metabolic panel, Lipid panel  8. Encounter for long-term (current) use of other medications  CBC with Differential, Comprehensive metabolic panel, Lipid panel  9. Active smoker     Her medications were renewed. I recommended she get counseling to help deal with a loss that she's had in her life although she does seem to be handling this fairly well. Also discussed weight loss with her.

## 2011-09-19 NOTE — Patient Instructions (Signed)
Let me know how the higher dose of the Mirapex works. When you're ready to quit smoking, let me know. Keep working on weight reduction.

## 2011-09-20 LAB — CBC WITH DIFFERENTIAL/PLATELET
Basophils Absolute: 0 10*3/uL (ref 0.0–0.1)
Basophils Relative: 0 % (ref 0–1)
Eosinophils Absolute: 0.3 10*3/uL (ref 0.0–0.7)
Eosinophils Relative: 4 % (ref 0–5)
HCT: 37.8 % (ref 36.0–46.0)
Hemoglobin: 11.6 g/dL — ABNORMAL LOW (ref 12.0–15.0)
Lymphocytes Relative: 25 % (ref 12–46)
Lymphs Abs: 1.9 10*3/uL (ref 0.7–4.0)
MCH: 26.2 pg (ref 26.0–34.0)
MCHC: 30.7 g/dL (ref 30.0–36.0)
MCV: 85.3 fL (ref 78.0–100.0)
Monocytes Absolute: 0.5 10*3/uL (ref 0.1–1.0)
Monocytes Relative: 7 % (ref 3–12)
Neutro Abs: 5 10*3/uL (ref 1.7–7.7)
Neutrophils Relative %: 64 % (ref 43–77)
Platelets: 322 10*3/uL (ref 150–400)
RBC: 4.43 MIL/uL (ref 3.87–5.11)
RDW: 15.4 % (ref 11.5–15.5)
WBC: 7.8 10*3/uL (ref 4.0–10.5)

## 2011-09-20 LAB — COMPREHENSIVE METABOLIC PANEL
ALT: 15 U/L (ref 0–35)
AST: 17 U/L (ref 0–37)
Albumin: 4.2 g/dL (ref 3.5–5.2)
Alkaline Phosphatase: 96 U/L (ref 39–117)
BUN: 18 mg/dL (ref 6–23)
CO2: 30 mEq/L (ref 19–32)
Calcium: 9.9 mg/dL (ref 8.4–10.5)
Chloride: 102 mEq/L (ref 96–112)
Creat: 0.96 mg/dL (ref 0.50–1.10)
Glucose, Bld: 95 mg/dL (ref 70–99)
Potassium: 4.1 mEq/L (ref 3.5–5.3)
Sodium: 141 mEq/L (ref 135–145)
Total Bilirubin: 0.3 mg/dL (ref 0.3–1.2)
Total Protein: 7.3 g/dL (ref 6.0–8.3)

## 2011-09-20 LAB — LIPID PANEL
Cholesterol: 199 mg/dL (ref 0–200)
HDL: 55 mg/dL (ref >39)
LDL Cholesterol: 108 mg/dL — ABNORMAL HIGH (ref 0–99)
Total CHOL/HDL Ratio: 3.6 Ratio
Triglycerides: 178 mg/dL — ABNORMAL HIGH (ref <150)
VLDL: 36 mg/dL (ref 0–40)

## 2011-10-08 ENCOUNTER — Ambulatory Visit: Payer: BC Managed Care – PPO | Admitting: Medical

## 2011-10-08 ENCOUNTER — Ambulatory Visit (INDEPENDENT_AMBULATORY_CARE_PROVIDER_SITE_OTHER): Payer: BC Managed Care – PPO | Admitting: Medical

## 2011-10-08 ENCOUNTER — Encounter: Payer: Self-pay | Admitting: Medical

## 2011-10-08 VITALS — BP 102/70 | HR 80 | Temp 98.0°F | Wt 241.0 lb

## 2011-10-08 DIAGNOSIS — J029 Acute pharyngitis, unspecified: Secondary | ICD-10-CM | POA: Insufficient documentation

## 2011-10-08 DIAGNOSIS — J020 Streptococcal pharyngitis: Secondary | ICD-10-CM

## 2011-10-08 LAB — POCT RAPID STREP A (OFFICE): Rapid Strep A Screen: POSITIVE — AB

## 2011-10-08 MED ORDER — AMOXICILLIN 875 MG PO TABS: 875.0000 mg | ORAL_TABLET | Freq: Two times a day (BID) | ORAL | Status: AC

## 2011-10-08 NOTE — Progress Notes (Deleted)
  Subjective:    Patient ID: Cynthia [name], female    DOB: August 10, 1961, 50 y.o.   MRN: [id number]  HPI    Review of Systems     Objective:   Physical Exam        Assessment & Plan:   Subjective:   HPI  Cynthia [name] is a 50 y.o. female who presents    ST  No other aggravating or relieving factors.    No other c/o.   The following portions of the patient's history were reviewed and updated as appropriate: {history reviewed:20406::"allergies","current medications","past family history","past medical history","past social history","past surgical history","problem list"}.    Review of Systems Constitutional: -fever, +chills, +sweats, -unexpected -weight change,-fatigue ENT: -runny nose, +ear pain, +sore throat Cardiology:  -chest pain, -palpitations, -edema Respiratory: -cough, -shortness of breath, -wheezing Gastroenterology: -abdominal pain, -nausea, -vomiting, -diarrhea, -constipation Hematology: -bleeding or bruising problems Musculoskeletal: -arthralgias, -myalgias, -joint swelling, -back pain Ophthalmology: -vision changes Urology: -dysuria, -difficulty urinating, -hematuria, -urinary frequency, -urgency Neurology: +headache, -weakness, -tingling, -numbness

## 2011-10-08 NOTE — Patient Instructions (Signed)

## 2011-10-08 NOTE — Progress Notes (Addendum)
Cynthia [name] is a 50 y.o. female who presents for c/o sore throat, swollen glands, neck discomfort, chills, but no fever.  She notes mild cough and headache.  Denies nausea, vomiting, diarrhea, body aches.  She has had some chills.  Denies sick contacts.  Using some Advil for symptoms.  She has not had a recent close exposure to someone with proven streptococcal pharyngitis.    Objective:      Filed Vitals:   10/08/11 1359  BP: 102/70  Pulse: 80  Temp: 98 F (36.7 C)    General appearance: no distress, WD/WN, mildly ill-appearing HEENT: normocephalic, conjunctiva/corneas normal, sclerae anicteric, nares patent, no discharge or erythema, pharynx with erythema, post nasal drip, s/p tonsillectomy Oral cavity: MMM, no lesions  Neck: supple, shoddy lymphadenopathy, no thyromegaly Heart: RRR, normal S1, S2, no murmurs Lungs: CTA bilaterally, no wheezes, rhonchi, or rales   Laboratory Strep test done.  Results negative.   Assessment and Plan:     Encounter Diagnoses  Name Primary?  . Strep pharyngitis Yes  . Pharyngitis     Strep +.  Script for Amoxicillin.  Discussed precaution, contagious for at least 24 hours.  Discussed symptomatic treatment including salt water gargles, warm fluids, rest, hydrate well, can use over-the-counter Tylenol or Ibuprofen for throat pain, fever, or malaise. If worse or not improving within 2-3 days, call or return.

## 2011-10-08 NOTE — Progress Notes (Signed)
Addended by: Carlena Hurl on: 10/08/2011 03:03 PM   Modules accepted: Orders

## 2011-11-17 ENCOUNTER — Institutional Professional Consult (permissible substitution): Payer: BC Managed Care – PPO | Admitting: Family Medicine

## 2011-11-21 ENCOUNTER — Ambulatory Visit (INDEPENDENT_AMBULATORY_CARE_PROVIDER_SITE_OTHER): Payer: BC Managed Care – PPO | Admitting: Family Medicine

## 2011-11-21 ENCOUNTER — Encounter: Payer: Self-pay | Admitting: Family Medicine

## 2011-11-21 VITALS — BP 116/70 | HR 68 | Ht 64.0 in | Wt 237.0 lb

## 2011-11-21 DIAGNOSIS — E669 Obesity, unspecified: Secondary | ICD-10-CM

## 2011-11-21 DIAGNOSIS — F17200 Nicotine dependence, unspecified, uncomplicated: Secondary | ICD-10-CM

## 2011-11-21 MED ORDER — VARENICLINE TARTRATE 0.5 MG PO TABS: 0.5000 mg | ORAL_TABLET | Freq: Two times a day (BID) | ORAL | Status: DC

## 2011-11-21 NOTE — Progress Notes (Signed)
  Subjective:    Patient ID: Cynthia [name], female    DOB: Dec 09, 1960, 51 y.o.   MRN: [id number]  HPI She is here for consultation concerning smoking cessation as well as weight loss. She did quit smoking in the past but started again due to stressful situations. She would like to get back on Chantix which she says worked very well and did not cause any symptoms. She would also like to be placed on a weight loss pill.   Review of Systems     Objective:   Physical Exam Alert and in no distress otherwise not examined       Assessment & Plan:   1. Obesity (BMI 30-39.9)  Amb ref to Medical Nutrition Therapy-MNT  2. Current smoker     she has a very good handle on how to handle her smoking. I will therefore start her back on Chantix. Also discussed the need for her to work on the triggers that make her smoke again specifically stressful situations. We will also set her up for nutrition counseling and consider starting her on Meridia after she starts on Chantix.

## 2011-12-04 ENCOUNTER — Other Ambulatory Visit: Payer: Self-pay | Admitting: Family Medicine

## 2011-12-04 ENCOUNTER — Ambulatory Visit (INDEPENDENT_AMBULATORY_CARE_PROVIDER_SITE_OTHER): Payer: BC Managed Care – PPO | Admitting: Family Medicine

## 2011-12-04 ENCOUNTER — Encounter: Payer: Self-pay | Admitting: Family Medicine

## 2011-12-04 VITALS — BP 120/70 | Ht 64.0 in | Wt 240.0 lb

## 2011-12-04 DIAGNOSIS — IMO0002 Reserved for concepts with insufficient information to code with codable children: Secondary | ICD-10-CM

## 2011-12-04 LAB — POCT URINALYSIS DIPSTICK
Bilirubin, UA: NEGATIVE
Glucose, UA: NEGATIVE
Ketones, UA: NEGATIVE
Nitrite, UA: NEGATIVE
Spec Grav, UA: 1.015
Urobilinogen, UA: NEGATIVE
pH, UA: 6

## 2011-12-04 MED ORDER — AMOXICILLIN-POT CLAVULANATE 875-125 MG PO TABS: 1.0000 | ORAL_TABLET | Freq: Two times a day (BID) | ORAL | Status: AC

## 2011-12-04 NOTE — Progress Notes (Signed)
  Subjective:    Patient ID: Cynthia [name], female    DOB: 1961/07/04, 51 y.o.   MRN: [id number]  HPI Several days ago she injured her index finger and excising the nail bed. Over the last several days she noted increased pain, swelling and discoloration. Also recently she has had some difficulty with urinary frequency.   Review of Systems     Objective:   Physical Exam The tip of the index finger is purplish red, tender. Proximal to the nailbed there is some swelling, discoloration and slight drainage from the wound. Urine microscopic was contaminated but did have white cells present.      Assessment & Plan:   1. Felon  POCT Urinalysis Dipstick, Wound culture   possible UTI. I will place her on Augmentin which should help for both. Culture was also taken from the wound on the finger.

## 2011-12-07 LAB — WOUND CULTURE
Gram Stain: NONE SEEN
Gram Stain: NONE SEEN
Gram Stain: NONE SEEN
Organism ID, Bacteria: NO GROWTH

## 2011-12-23 ENCOUNTER — Encounter: Payer: Self-pay | Admitting: *Deleted

## 2011-12-23 ENCOUNTER — Encounter: Payer: BC Managed Care – PPO | Attending: Family Medicine | Admitting: *Deleted

## 2011-12-23 DIAGNOSIS — E669 Obesity, unspecified: Secondary | ICD-10-CM

## 2011-12-23 DIAGNOSIS — Z713 Dietary counseling and surveillance: Secondary | ICD-10-CM | POA: Insufficient documentation

## 2011-12-23 NOTE — Patient Instructions (Addendum)
Plan: Aim for 3 Carb Choices per meal, 0-2 per snack if hungry [place] of 1 carb either way as needed [place] for Total Carbohydrate and Fat grams of foods eaten at home Consider putting all snack foods in a dish or pre-measured bag to increase awareness of portions Increase activity level by dancing 10-15 minutes a day as tolerated

## 2011-12-23 NOTE — Progress Notes (Signed)
  Medical Nutrition Therapy:  Appt start time: 1130 end time:  1230.   Assessment:  Primary concerns today: Patient needs assistance with weight loss. She has had several major life events that have affected her eating habits and resulted in weight gain to 241.7 pounds. She has gotten a better grip on her life lately and is ready to quit smoking and lose this weight. She quit smoking 17 days ago and is currently remodeling her home as she lost her job last year. She has initiated several things like organizing her kitchen to be more efficient and purchasing a Food Saver to encourage her to cook more at home vs. Eating out.  MEDICATIONS: see list   DIETARY INTAKE:  Usual eating pattern includes 3 meals and multiple snacks per day.  Everyday foods include variety of all food groups.  Avoided foods include salad.    24-hr recall:  B ( AM): 2 sausage and 1 egg, coffee with creamer and Sweet and Low  Snk ( AM): grazer- citrus fruit, cinnamon toast crunch cereal without milk out of the box L (3  PM): sandwich on whole wheat bread and small mayo, OR cheese and crackers  Snk ( PM): graze again - chocolate candy (small pieces) D ( PM): 1 piece of meat, starch, vegetables,  Snk ( PM): graze - ice cream sometimes with fruit or chocolate OR citrus fruit Beverages: coffee, diet soda, flavored water, Likes tea but hasn't been lately  Usual physical activity: remodeling home, so active all day, though not aerobic  Estimated energy needs: 1600 calories 180 g carbohydrates 120 g protein 44 g fat  Progress Towards Goal(s):  In progress.   Nutritional Diagnosis:  NI-1.5 Excessive energy intake As related to obesity.  As evidenced by BMI of 41.6%.    Intervention:  Nutrition counseling provided on major food groups, value of using carb counting to control over all food intake, ways to increase her awareness of foods eaten and to cut down on grazing. Also discussed ways to increase activity level  without having to leave her home. Also weighted her on [place] scale for baseline of body composition. TANITA  BODY COMP RESULTS: %Fat:49.9% FM:120.0 lb FFM:120.0 lb TBW: 88 lb Plan: Aim for 3 Carb Choices per meal, 0-2 per snack if hungry [place] of 1 carb either way as needed Read Food Labels for Total Carbohydrate and Fat grams of foods eaten at home Consider putting all snack foods in a dish or pre-measured bag to increase awareness of portions Increase activity level by dancing 10-15 minutes a day as tolerated  Handouts given during visit include:  Carb counting and reading food labels  Meal planning card  Menu planner sheet for next visit  Monitoring/Evaluation:  Dietary intake, exercise, portion control, and body weight in 2 week(s).

## 2012-01-02 ENCOUNTER — Telehealth: Payer: Self-pay | Admitting: Family Medicine

## 2012-01-02 MED ORDER — VARENICLINE TARTRATE 1 MG PO TABS: 1.0000 mg | ORAL_TABLET | Freq: Two times a day (BID) | ORAL | Status: DC

## 2012-01-02 NOTE — Telephone Encounter (Signed)
Chantix called in.

## 2012-01-05 ENCOUNTER — Encounter: Payer: BC Managed Care – PPO | Attending: Family Medicine | Admitting: *Deleted

## 2012-01-05 DIAGNOSIS — E669 Obesity, unspecified: Secondary | ICD-10-CM | POA: Insufficient documentation

## 2012-01-05 DIAGNOSIS — Z713 Dietary counseling and surveillance: Secondary | ICD-10-CM | POA: Insufficient documentation

## 2012-01-05 NOTE — Patient Instructions (Signed)
Plan: Ideas to consider- Take best walking dog for walk with friend in the afternoons every day Set up tray of cukes, grapes etc to alternate with cereal snack Enjoy new counter tops and use table (?) to sit down to eat meals and snacks Continue with other successes we discussed Celebrate new counter tops with Popcorn

## 2012-01-05 NOTE — Progress Notes (Signed)
  Medical Nutrition Therapy:  Appt start time: 1200 end time:  1230.  Assessment:  Primary concerns today:Patient is here for follow up appointment for obesity. Although she has gained 3 pounds, she quit smoking on February 3rd and she has made several lifestyle changes to improve her eating, food purchase and preparation and emotional eating. She is eating out less and coming up with snack ideas that are lower in calories than before. She also took her 2 dogs for a walk and enjoyed that.  MEDICATIONS: see list   DIETARY INTAKE:  Usual eating pattern includes 3 meals and fewer snacks per day than before.  Everyday foods include variety of all food groups.  Avoided foods include salad.    24-hr recall:  B ( AM): 2 sausage and 1 egg, coffee with creamer and Sweet and Low  Snk ( AM): grazer- citrus fruit, cinnamon toast crunch cereal without milk out of the box L (3  PM): sandwich on whole wheat bread and small mayo, OR cheese and crackers  Snk ( PM): graze again - no more chocolate candy D ( PM): 1 piece of meat, starch, vegetables,  Snk ( PM): graze - ice cream OR citrus fruit Beverages: coffee, diet soda, flavored water, Likes tea but hasn't been lately  Usual physical activity: remodeling home, so active all day, though not aerobic  Estimated energy needs: 1600 calories 180 g carbohydrates 120 g protein 44 g fat  Progress Towards Goal(s):  In progress.   Nutritional Diagnosis:  NI-1.5 Excessive energy intake As related to obesity.  As evidenced by BMI of 41.6%.    Intervention:  Acknowledged the positive changes she has made and explained that increasing her acitivity level to an aerobic level is necessary for weight loss to occur. Also reminded her that with smoking cessation, weight gain often occurs due to omission of the cigarette in the mouth, usually replaced by food. So the 4 pound weight gain would probably been greater if she hadn't taken the steps that she has done so  far.  Plan: Ideas to consider- Take best walking dog for walk with friend in the afternoons every day Set up tray of cukes, grapes etc to alternate with cereal snack Enjoy new counter tops and use table (?) to sit down to eat meals and snacks Continue with other successes we discussed Celebrate new counter tops with Popcorn   Monitoring/Evaluation:  Dietary intake, exercise, portion control, and body weight in 3 week(s).

## 2012-01-06 ENCOUNTER — Encounter: Payer: Self-pay | Admitting: *Deleted

## 2012-01-07 ENCOUNTER — Ambulatory Visit: Payer: BC Managed Care – PPO | Admitting: *Deleted

## 2012-01-20 ENCOUNTER — Encounter: Payer: BC Managed Care – PPO | Admitting: *Deleted

## 2012-01-20 ENCOUNTER — Encounter: Payer: Self-pay | Admitting: *Deleted

## 2012-01-20 VITALS — Ht 64.0 in | Wt 246.6 lb

## 2012-01-20 DIAGNOSIS — E669 Obesity, unspecified: Secondary | ICD-10-CM

## 2012-01-20 NOTE — Progress Notes (Signed)
  Medical Nutrition Therapy:  Appt start time: 1000 end time:  1030.  Assessment:  Primary concerns today: Patient here for follow up visit for obesity. She has continued to not smoke since February and continues to renovate her home. She is applying for jobs but has bladder surgery planned for April so that will delay any employment opportunities until the summer. She states she enjoys the dancing and does it daily, is replacing vegetables for most of her starch choices and has walked her dog some, though if buddy isn't available to walk with she doesn't go.  MEDICATIONS: see list   DIETARY INTAKE:  Usual eating pattern includes 3 meals and fewer snacks per day than before.  Everyday foods include variety of all food groups.      24-hr recall:  B ( AM): 2 sausage and 1 egg, coffee with creamer and Sweet and Low  Snk ( AM): no longer grazing and no more cinnamon toast crunch cereal without milk out of the box L (3  PM): sandwich on whole wheat bread and small mayo, OR cheese and crackers  Snk ( PM): no more chocolate candy D ( PM): 1 piece of meat, starch, vegetables,  Snk ( PM):  citrus fruit Beverages: coffee, diet soda, flavored water, Likes tea but hasn't been lately  Usual physical activity: remodeling home, so active all day, though not aerobic  Estimated energy needs: 1600 calories 180 g carbohydrates 120 g protein 44 g fat  Progress Towards Goal(s):  In progress.   Nutritional Diagnosis:  NI-1.5 Excessive energy intake As related to obesity.  As evidenced by BMI of 41.6%.    Intervention:  Commended her on her multiple positive eating habits and the ways she is increasing her activity level. Although she has gained about 3 pounds, if she hadn't made these changes she probably would have gained much more with smoking cessation. I encouraged her to be physically active EVERY day and to use the dancing on days she doesn't walk outside in addition to current dancing time.    Plan: Continue with all positive eating habit changes you've made in the past couple of months Continue with dancing 15 minutes each day Walk dog 15 minutes as able, and when not taking dog out, add 15 minutes dancing to that day Continue eating 3 meals a day and not skipping lunch anymore    Monitoring/Evaluation:  Dietary intake, exercise, portion control, and body weight in 3 week(s).

## 2012-01-21 ENCOUNTER — Other Ambulatory Visit: Payer: Self-pay | Admitting: Obstetrics and Gynecology

## 2012-01-22 NOTE — Patient Instructions (Signed)
Plan: Continue with all positive eating habit changes you've made in the past couple of months Continue with dancing 15 minutes each day Walk dog 15 minutes as able, and when not taking dog out, add 15 minutes dancing to that day Continue eating 3 meals a day and not skipping lunch anymore

## 2012-02-04 ENCOUNTER — Encounter (HOSPITAL_COMMUNITY): Payer: Self-pay | Admitting: Pharmacist

## 2012-02-16 ENCOUNTER — Encounter (HOSPITAL_COMMUNITY): Payer: Self-pay

## 2012-02-16 ENCOUNTER — Encounter: Payer: Self-pay | Admitting: Family Medicine

## 2012-02-16 ENCOUNTER — Encounter (HOSPITAL_COMMUNITY)
Admission: RE | Admit: 2012-02-16 | Discharge: 2012-02-16 | Disposition: A | Discharge status: Home / Self Care | Payer: BC Managed Care – PPO | Source: Ambulatory Visit | Attending: Obstetrics and Gynecology | Admitting: Obstetrics and Gynecology

## 2012-02-16 ENCOUNTER — Ambulatory Visit (INDEPENDENT_AMBULATORY_CARE_PROVIDER_SITE_OTHER): Payer: BC Managed Care – PPO | Admitting: Family Medicine

## 2012-02-16 ENCOUNTER — Other Ambulatory Visit: Payer: Self-pay

## 2012-02-16 VITALS — BP 118/70 | HR 79 | Temp 98.2°F | Wt 257.0 lb

## 2012-02-16 DIAGNOSIS — J029 Acute pharyngitis, unspecified: Secondary | ICD-10-CM

## 2012-02-16 DIAGNOSIS — G2581 Restless legs syndrome: Secondary | ICD-10-CM

## 2012-02-16 LAB — CBC
HCT: 34 % — ABNORMAL LOW (ref 36.0–46.0)
Hemoglobin: 10.5 g/dL — ABNORMAL LOW (ref 12.0–15.0)
MCH: 26.5 pg (ref 26.0–34.0)
MCHC: 30.9 g/dL (ref 30.0–36.0)
MCV: 85.9 fL (ref 78.0–100.0)
Platelets: 281 10*3/uL (ref 150–400)
RBC: 3.96 MIL/uL (ref 3.87–5.11)
RDW: 15.4 % (ref 11.5–15.5)
WBC: 7.1 10*3/uL (ref 4.0–10.5)

## 2012-02-16 LAB — BASIC METABOLIC PANEL
BUN: 14 mg/dL (ref 6–23)
CO2: 29 mEq/L (ref 19–32)
Calcium: 9.4 mg/dL (ref 8.4–10.5)
Chloride: 103 mEq/L (ref 96–112)
Creatinine, Ser: 0.84 mg/dL (ref 0.50–1.10)
GFR calc Af Amer: >90 mL/min (ref >90)
GFR calc non Af Amer: 79 mL/min — ABNORMAL LOW (ref >90)
Glucose, Bld: 89 mg/dL (ref 70–99)
Potassium: 3.7 mEq/L (ref 3.5–5.1)
Sodium: 141 mEq/L (ref 135–145)

## 2012-02-16 LAB — POCT RAPID STREP A (OFFICE): Rapid Strep A Screen: NEGATIVE

## 2012-02-16 MED ORDER — PRAMIPEXOLE DIHYDROCHLORIDE 1 MG PO TABS: 1.0000 mg | ORAL_TABLET | Freq: Three times a day (TID) | ORAL | Status: DC

## 2012-02-16 MED ORDER — PRAMIPEXOLE DIHYDROCHLORIDE 1 MG PO TABS: 1.0000 mg | ORAL_TABLET | Freq: Every day | ORAL | Status: DC

## 2012-02-16 NOTE — Patient Instructions (Signed)
YOUR PROCEDURE IS SCHEDULED ON:02/25/12  [place] [facility]TR[facility] OF [facility] [id number] am  USE DESK PHONE AND DIAL [id number] TO INFORM us OF YOUR ARRIVAL  CALL [phone] IF YOU HAVE ANY QUESTIONS OR PROBLEMS PRIOR TO YOUR ARRIVAL.  REMEMBER: DO NOT EAT OR DRINK AFTER MIDNIGHT :Tuesday  SPECIAL INSTRUCTIONS:   YOU MAY BRUSH YOUR TEETH THE MORNING OF SURGERY   TAKE THESE MEDICINES THE DAY OF SURGERY WITH SIP OF WATER: am meds   DO NOT WEAR JEWELRY, EYE MAKEUP, LIPSTICK OR DARK FINGERNAIL POLISH DO NOT WEAR LOTIONS  DO NOT SHAVE FOR 48 HOURS PRIOR TO SURGERY  YOU WILL NOT BE ALLOWED TO DRIVE YOURSELF HOME.  NAME OF DRIVER: Ria Comment

## 2012-02-16 NOTE — Progress Notes (Signed)
  Subjective:    Patient ID: Cynthia [name], female    DOB: 11/07/1960, 51 y.o.   MRN: [id number]  HPI She has a three-day history of sore throat and slight sinus congestion with slight left earache. No fever, chills, cough or congestion she quit smoking in February 2. Also states that the Requip requires higher dosing. It does help with her RLS. She is scheduled in the near future for gynecologic surgery to help with a cystocele.  Review of Systems     Objective:   Physical Exam alert and in no distress. Tympanic membranes and canals are normal. Throat is clear. Tonsils are normal. Neck is supple without adenopathy or thyromegaly. Cardiac exam shows a regular sinus rhythm without murmurs or gallops. Lungs are clear to auscultation. Strep Screen negative       Assessment & Plan:   1. RLS (restless legs syndrome)  pramipexole (MIRAPEX) 1 MG tablet, pramipexole (MIRAPEX) 1 MG tablet  2. Pharyngitis, acute  Rapid Strep A   Supportive care for the pharyngitis. Discussed diet and exercise with her. Also recommend she discuss weight loss as a therapy for her bladder related issues.

## 2012-02-17 ENCOUNTER — Ambulatory Visit: Payer: BC Managed Care – PPO | Admitting: *Deleted

## 2012-02-18 ENCOUNTER — Telehealth: Payer: Self-pay | Admitting: Family Medicine

## 2012-02-18 ENCOUNTER — Encounter: Payer: Self-pay | Admitting: *Deleted

## 2012-02-18 NOTE — Telephone Encounter (Signed)
LM

## 2012-02-24 MED ORDER — DEXTROSE 5 % IV SOLN
2.0000 g | INTRAVENOUS | Status: AC
  Administered 2012-02-25: 2 g via INTRAVENOUS
  Filled 2012-02-24: qty 2

## 2012-02-24 NOTE — H&P (Signed)
51 y.o. yo complains of SUI.  She leaks every time she coughs, sneezes or laughs.  She denies splinting. Past Medical History  Diagnosis Date  . Hypertension   . Heart murmur   . GERD (gastroesophageal reflux disease)   . Headache     MIGRAINE  . Anxiety   . Depression   . Incontinence   . IBS (irritable bowel syndrome)   . MVP (mitral valve prolapse)   . Hiatal hernia   . RLS (restless legs syndrome)   . Anemia 2012   Past Surgical History  Procedure Date  . Tonsillectomy   . Knee arthroscopy 2010  . Laparoscopic assisted vaginal hysterectomy 06/17/2011    Procedure: LAPAROSCOPIC ASSISTED VAGINAL HYSTERECTOMY;  Surgeon: Arloa Koh;  Location: [facility];  Service: Gynecology;  Laterality: N/A;  Attempted   . Salpingoophorectomy 06/17/2011    Procedure: SALPINGO OOPHERECTOMY;  Surgeon: Arloa Koh;  Location: [facility];  Service: Gynecology;  Laterality: Bilateral;  . Abdominal hysterectomy 06/17/2011    Procedure: HYSTERECTOMY ABDOMINAL;  Surgeon: Arloa Koh;  Location: [facility];  Service: Gynecology;  Laterality: N/A;  Converted to Abdominal Hysterectomy with lysis of adhesions     History   Social History  . Marital Status: Widowed    Spouse Name: N/A    Number of Children: 1  . Years of Education: N/A   Occupational History  . SITE MANAGER    Social History Main Topics  . Smoking status: Former Smoker -- 0.5 packs/day    Types: Cigarettes    Quit date: 12/07/2011  . Smokeless tobacco: Never Used  . Alcohol Use: Yes     socially  . Drug Use: No  . Sexually Active: Not on file   Other Topics Concern  . Not on file   Social History Narrative  . No narrative on file    No current facility-administered medications on file prior to encounter.   Current Outpatient Prescriptions on File Prior to Encounter  Medication Sig Dispense Refill  . atenolol-chlorthalidone (TENORETIC) 50-25 MG per tablet Take 1 tablet by mouth daily.  90 tablet  3  . cyclobenzaprine  (FLEXERIL) 10 MG tablet Take 1 tablet (10 mg total) by mouth 3 (three) times daily as needed.  30 tablet  2  . diphenhydramine-acetaminophen (TYLENOL PM) 25-500 MG TABS Take 1 tablet by mouth at bedtime as needed.        Marland Kitchen esomeprazole (NEXIUM) 40 MG capsule Take 1 capsule (40 mg total) by mouth 2 (two) times daily.  180 capsule  3  . OVER THE COUNTER MEDICATION Place 3 tablets under the tongue at bedtime. Restfull Legs herbal supplement       . pramipexole (MIRAPEX) 1 MG tablet Take 1 tablet (1 mg total) by mouth at bedtime.  30 tablet  0  . SUMAtriptan (IMITREX) 100 MG tablet Take 1 tablet (100 mg total) by mouth every 2 (two) hours as needed.  10 tablet  3  . venlafaxine (EFFEXOR-XR) 150 MG 24 hr capsule Take 1 capsule (150 mg total) by mouth 2 (two) times daily.  180 capsule  3    Allergies  Allergen Reactions  . Iodine     REACTION: in eye gtts    Vitals P  Lungs: clear to ascultation Cor:  RRR Abdomen:  soft, nontender, nondistended. Ex:  no cords, erythema Pelvic: NEFG. Well suspended cuff.  Cystocele -1, Rectocele -1.  Qtip >45.  Cystometrics: Genuine stress urinary incontinence with LPP 220.  A:  For TVT/ Arepair for SUI and cystocele.  Pt is assymptomatic with rectocele.   P   All risks, benefits and alternatives d/w patient and she desires to proceed .  Patient will receive preop antibiotics and SCDs during the operation.     Lawrence Roldan A

## 2012-02-25 ENCOUNTER — Encounter (HOSPITAL_COMMUNITY)
Admission: RE | Disposition: A | Discharge status: Home / Self Care | Payer: Self-pay | Source: Ambulatory Visit | Attending: Obstetrics and Gynecology

## 2012-02-25 ENCOUNTER — Ambulatory Visit (HOSPITAL_COMMUNITY): Payer: BC Managed Care – PPO | Admitting: Anesthesiology

## 2012-02-25 ENCOUNTER — Encounter (HOSPITAL_COMMUNITY): Payer: Self-pay | Admitting: Anesthesiology

## 2012-02-25 ENCOUNTER — Ambulatory Visit (HOSPITAL_COMMUNITY)
Admission: RE | Admit: 2012-02-25 | Discharge: 2012-02-25 | Disposition: A | Discharge status: Home / Self Care | Payer: BC Managed Care – PPO | Source: Ambulatory Visit | Attending: Obstetrics and Gynecology | Admitting: Obstetrics and Gynecology

## 2012-02-25 ENCOUNTER — Encounter (HOSPITAL_COMMUNITY): Payer: Self-pay | Admitting: *Deleted

## 2012-02-25 DIAGNOSIS — Z9889 Other specified postprocedural states: Secondary | ICD-10-CM

## 2012-02-25 DIAGNOSIS — Y9210 Unspecified residential institution as the place of occurrence of the external cause: Secondary | ICD-10-CM | POA: Insufficient documentation

## 2012-02-25 DIAGNOSIS — IMO0002 Reserved for concepts with insufficient information to code with codable children: Secondary | ICD-10-CM | POA: Insufficient documentation

## 2012-02-25 DIAGNOSIS — N393 Stress incontinence (female) (male): Secondary | ICD-10-CM | POA: Insufficient documentation

## 2012-02-25 DIAGNOSIS — Z01812 Encounter for preprocedural laboratory examination: Secondary | ICD-10-CM | POA: Insufficient documentation

## 2012-02-25 DIAGNOSIS — N8111 Cystocele, midline: Secondary | ICD-10-CM | POA: Insufficient documentation

## 2012-02-25 HISTORY — PX: BLADDER SUSPENSION: SHX72

## 2012-02-25 HISTORY — PX: ANTERIOR AND POSTERIOR REPAIR: SHX5121

## 2012-02-25 HISTORY — PX: CYSTOSCOPY: SHX5120

## 2012-02-25 LAB — PREGNANCY, URINE: Preg Test, Ur: NEGATIVE

## 2012-02-25 SURGERY — URETHROPEXY, USING TRANSVAGINAL TAPE
Anesthesia: General | Site: Vagina | Wound class: Clean Contaminated

## 2012-02-25 MED ORDER — MIDAZOLAM HCL 5 MG/5ML IJ SOLN
INTRAMUSCULAR | Status: DC | PRN
  Administered 2012-02-25: 2 mg via INTRAVENOUS

## 2012-02-25 MED ORDER — ONDANSETRON HCL 4 MG/2ML IJ SOLN: 4.0000 mg | Freq: Four times a day (QID) | INTRAMUSCULAR | Status: DC | PRN

## 2012-02-25 MED ORDER — ATENOLOL 50 MG PO TABS
50.0000 mg | ORAL_TABLET | Freq: Every day | ORAL | Status: DC
  Filled 2012-02-25: qty 1

## 2012-02-25 MED ORDER — OXYCODONE-ACETAMINOPHEN 5-325 MG PO TABS
1.0000 | ORAL_TABLET | ORAL | Status: DC | PRN
  Administered 2012-02-25: 2 via ORAL
  Filled 2012-02-25: qty 2

## 2012-02-25 MED ORDER — DEXAMETHASONE SODIUM PHOSPHATE 10 MG/ML IJ SOLN
INTRAMUSCULAR | Status: AC
  Filled 2012-02-25: qty 1

## 2012-02-25 MED ORDER — VENLAFAXINE HCL ER 150 MG PO CP24
150.0000 mg | ORAL_CAPSULE | Freq: Two times a day (BID) | ORAL | Status: DC
  Filled 2012-02-25 (×3): qty 1

## 2012-02-25 MED ORDER — MIDAZOLAM HCL 2 MG/2ML IJ SOLN
INTRAMUSCULAR | Status: AC
  Filled 2012-02-25: qty 2

## 2012-02-25 MED ORDER — POLYETHYL GLYCOL-PROPYL GLYCOL 0.4-0.3 % OP SOLN: 1.0000 [drp] | OPHTHALMIC | Status: DC | PRN

## 2012-02-25 MED ORDER — HYDROMORPHONE HCL PF 1 MG/ML IJ SOLN: 0.2500 mg | INTRAMUSCULAR | Status: DC | PRN

## 2012-02-25 MED ORDER — ESTRADIOL 0.1 MG/GM VA CREA
TOPICAL_CREAM | VAGINAL | Status: AC
  Filled 2012-02-25: qty 42.5

## 2012-02-25 MED ORDER — PROPOFOL 10 MG/ML IV EMUL
INTRAVENOUS | Status: DC | PRN
  Administered 2012-02-25 (×2): 50 mg via INTRAVENOUS
  Administered 2012-02-25: 90 mg via INTRAVENOUS
  Administered 2012-02-25: 200 mg via INTRAVENOUS

## 2012-02-25 MED ORDER — FENTANYL CITRATE 0.05 MG/ML IJ SOLN
INTRAMUSCULAR | Status: DC | PRN
  Administered 2012-02-25: 50 ug via INTRAVENOUS
  Administered 2012-02-25: 100 ug via INTRAVENOUS
  Administered 2012-02-25 (×2): 50 ug via INTRAVENOUS

## 2012-02-25 MED ORDER — ESTRADIOL 0.1 MG/GM VA CREA
TOPICAL_CREAM | VAGINAL | Status: DC | PRN
  Administered 2012-02-25: 1 via VAGINAL

## 2012-02-25 MED ORDER — LIDOCAINE-EPINEPHRINE 0.5-1:200000 % IJ SOLN
INTRAMUSCULAR | Status: AC
  Filled 2012-02-25: qty 1

## 2012-02-25 MED ORDER — KETOROLAC TROMETHAMINE 30 MG/ML IJ SOLN: 30.0000 mg | Freq: Four times a day (QID) | INTRAMUSCULAR | Status: DC

## 2012-02-25 MED ORDER — INDIGOTINDISULFONATE SODIUM 8 MG/ML IJ SOLN
INTRAMUSCULAR | Status: AC
  Filled 2012-02-25: qty 5

## 2012-02-25 MED ORDER — OXYCODONE-ACETAMINOPHEN 5-325 MG PO TABS: 1.0000 | ORAL_TABLET | ORAL | Status: AC | PRN

## 2012-02-25 MED ORDER — ZOLPIDEM TARTRATE 5 MG PO TABS: 5.0000 mg | ORAL_TABLET | Freq: Every evening | ORAL | Status: DC | PRN

## 2012-02-25 MED ORDER — DEXAMETHASONE SODIUM PHOSPHATE 4 MG/ML IJ SOLN
INTRAMUSCULAR | Status: DC | PRN
  Administered 2012-02-25: 10 mg via INTRAVENOUS

## 2012-02-25 MED ORDER — MENTHOL 3 MG MT LOZG
1.0000 | LOZENGE | OROMUCOSAL | Status: DC | PRN
  Administered 2012-02-25: 3 mg via ORAL
  Filled 2012-02-25: qty 9

## 2012-02-25 MED ORDER — VENLAFAXINE HCL ER 150 MG PO CP24
150.0000 mg | ORAL_CAPSULE | Freq: Two times a day (BID) | ORAL | Status: DC
  Filled 2012-02-25 (×2): qty 1

## 2012-02-25 MED ORDER — NEOSTIGMINE METHYLSULFATE 1 MG/ML IJ SOLN
INTRAMUSCULAR | Status: DC | PRN
  Administered 2012-02-25: 2 mg via INTRAVENOUS

## 2012-02-25 MED ORDER — ONDANSETRON HCL 4 MG PO TABS: 4.0000 mg | ORAL_TABLET | Freq: Four times a day (QID) | ORAL | Status: DC | PRN

## 2012-02-25 MED ORDER — FENTANYL CITRATE 0.05 MG/ML IJ SOLN
INTRAMUSCULAR | Status: AC
  Filled 2012-02-25: qty 2

## 2012-02-25 MED ORDER — MEPERIDINE HCL 25 MG/ML IJ SOLN: 6.2500 mg | INTRAMUSCULAR | Status: DC | PRN

## 2012-02-25 MED ORDER — CYCLOBENZAPRINE HCL 10 MG PO TABS
10.0000 mg | ORAL_TABLET | Freq: Three times a day (TID) | ORAL | Status: DC | PRN
  Filled 2012-02-25: qty 1

## 2012-02-25 MED ORDER — STERILE WATER FOR IRRIGATION IR SOLN
Status: DC | PRN
  Administered 2012-02-25: 4000 mL via INTRAVESICAL

## 2012-02-25 MED ORDER — IBUPROFEN 800 MG PO TABS: 800.0000 mg | ORAL_TABLET | Freq: Three times a day (TID) | ORAL | Status: DC | PRN

## 2012-02-25 MED ORDER — LIDOCAINE-EPINEPHRINE 0.5-1:200000 % IJ SOLN
INTRAMUSCULAR | Status: DC | PRN
  Administered 2012-02-25: 4 mL

## 2012-02-25 MED ORDER — LACTATED RINGERS IV SOLN
INTRAVENOUS | Status: DC
Start: 2012-02-25 — End: 2012-02-25
  Administered 2012-02-25 (×3): via INTRAVENOUS

## 2012-02-25 MED ORDER — INDIGOTINDISULFONATE SODIUM 8 MG/ML IJ SOLN
INTRAMUSCULAR | Status: DC | PRN
  Administered 2012-02-25: 5 mL via INTRAVENOUS

## 2012-02-25 MED ORDER — FENTANYL CITRATE 0.05 MG/ML IJ SOLN
INTRAMUSCULAR | Status: AC
  Filled 2012-02-25: qty 5

## 2012-02-25 MED ORDER — CEFUROXIME AXETIL 250 MG PO TABS: 250.0000 mg | ORAL_TABLET | Freq: Two times a day (BID) | ORAL | Status: AC

## 2012-02-25 MED ORDER — ATENOLOL-CHLORTHALIDONE 50-25 MG PO TABS: 1.0000 | ORAL_TABLET | Freq: Every day | ORAL | Status: DC

## 2012-02-25 MED ORDER — ROCURONIUM BROMIDE 100 MG/10ML IV SOLN
INTRAVENOUS | Status: DC | PRN
  Administered 2012-02-25: 5 mg via INTRAVENOUS
  Administered 2012-02-25: 45 mg via INTRAVENOUS

## 2012-02-25 MED ORDER — HYDROMORPHONE HCL PF 1 MG/ML IJ SOLN
INTRAMUSCULAR | Status: AC
  Administered 2012-02-25: 0.5 mg
  Filled 2012-02-25: qty 1

## 2012-02-25 MED ORDER — KETOROLAC TROMETHAMINE 60 MG/2ML IM SOLN
INTRAMUSCULAR | Status: AC
  Filled 2012-02-25: qty 2

## 2012-02-25 MED ORDER — GLYCOPYRROLATE 0.2 MG/ML IJ SOLN
INTRAMUSCULAR | Status: DC | PRN
  Administered 2012-02-25: 0.4 mg via INTRAVENOUS

## 2012-02-25 MED ORDER — KETOROLAC TROMETHAMINE 30 MG/ML IJ SOLN
INTRAMUSCULAR | Status: DC | PRN
  Administered 2012-02-25: 60 mg via INTRAVENOUS

## 2012-02-25 MED ORDER — ONDANSETRON HCL 4 MG/2ML IJ SOLN
INTRAMUSCULAR | Status: AC
  Filled 2012-02-25: qty 2

## 2012-02-25 MED ORDER — METOCLOPRAMIDE HCL 5 MG/ML IJ SOLN: 10.0000 mg | Freq: Once | INTRAMUSCULAR | Status: DC | PRN

## 2012-02-25 MED ORDER — SUMATRIPTAN SUCCINATE 100 MG PO TABS
100.0000 mg | ORAL_TABLET | ORAL | Status: DC | PRN
  Filled 2012-02-25: qty 1

## 2012-02-25 MED ORDER — PHENAZOPYRIDINE HCL 200 MG PO TABS: 200.0000 mg | ORAL_TABLET | Freq: Three times a day (TID) | ORAL | Status: AC | PRN

## 2012-02-25 MED ORDER — CHLORTHALIDONE 25 MG PO TABS
25.0000 mg | ORAL_TABLET | Freq: Every day | ORAL | Status: DC
  Filled 2012-02-25: qty 1

## 2012-02-25 MED ORDER — LIDOCAINE HCL (CARDIAC) 20 MG/ML IV SOLN
INTRAVENOUS | Status: DC | PRN
  Administered 2012-02-25: 20 mg via INTRAVENOUS
  Administered 2012-02-25: 100 mg via INTRAVENOUS
  Administered 2012-02-25: 20 mg via INTRAVENOUS

## 2012-02-25 MED ORDER — PANTOPRAZOLE SODIUM 40 MG PO TBEC
80.0000 mg | DELAYED_RELEASE_TABLET | Freq: Every day | ORAL | Status: DC
  Administered 2012-02-25: 80 mg via ORAL
  Filled 2012-02-25 (×2): qty 2

## 2012-02-25 MED ORDER — ONDANSETRON HCL 4 MG/2ML IJ SOLN
INTRAMUSCULAR | Status: DC | PRN
  Administered 2012-02-25: 4 mg via INTRAVENOUS

## 2012-02-25 SURGICAL SUPPLY — 46 items
ADH SKN CLS APL DERMABOND .7 (GAUZE/BANDAGES/DRESSINGS) ×2
BLADE SURG 15 STRL LF C SS BP (BLADE) ×2 IMPLANT
BLADE SURG 15 STRL SS (BLADE) ×3
CANISTER SUCTION 2500CC (MISCELLANEOUS) ×3 IMPLANT
CATH BONANNO SUPRAPUBIC 14G (CATHETERS) IMPLANT
CLOTH BEACON ORANGE TIMEOUT ST (SAFETY) ×3 IMPLANT
CONT PATH 16OZ SNAP LID 3702 (MISCELLANEOUS) IMPLANT
DECANTER SPIKE VIAL GLASS SM (MISCELLANEOUS) ×1 IMPLANT
DERMABOND ADVANCED (GAUZE/BANDAGES/DRESSINGS) ×1
DERMABOND ADVANCED .7 DNX12 (GAUZE/BANDAGES/DRESSINGS) ×2 IMPLANT
ELECT REM PT RETURN 9FT ADLT (ELECTROSURGICAL) ×3
ELECTRODE REM PT RTRN 9FT ADLT (ELECTROSURGICAL) IMPLANT
GAUZE PACKING 2X5 YD STERILE (GAUZE/BANDAGES/DRESSINGS) ×4 IMPLANT
GLOVE BIO SURGEON STRL SZ7 (GLOVE) ×12 IMPLANT
GLOVE BIO SURGEON STRL SZ7.5 (GLOVE) ×1 IMPLANT
GLOVE BIOGEL PI IND STRL 6.5 (GLOVE) IMPLANT
GLOVE BIOGEL PI INDICATOR 6.5 (GLOVE) ×2
GLOVE ECLIPSE 6.0 STRL STRAW (GLOVE) ×1 IMPLANT
GLOVE ECLIPSE 7.0 STRL STRAW (GLOVE) ×1 IMPLANT
GLOVE ECLIPSE 7.5 STRL STRAW (GLOVE) ×1 IMPLANT
GOWN PREVENTION PLUS LG XLONG (DISPOSABLE) ×13 IMPLANT
NDL SPNL 22GX3.5 QUINCKE BK (NEEDLE) IMPLANT
NEEDLE HYPO 22GX1.5 SAFETY (NEEDLE) ×3 IMPLANT
NEEDLE SPNL 22GX3.5 QUINCKE BK (NEEDLE) ×3 IMPLANT
NS IRRIG 1000ML POUR BTL (IV SOLUTION) ×3 IMPLANT
PACK VAGINAL WOMENS (CUSTOM PROCEDURE TRAY) ×3 IMPLANT
PENCIL BUTTON HOLSTER BLD 10FT (ELECTRODE) ×1 IMPLANT
PLUG CATH AND CAP STER (CATHETERS) ×3 IMPLANT
SET CYSTO W/LG BORE CLAMP LF (SET/KITS/TRAYS/PACK) ×3 IMPLANT
SLING TRANS VAGINAL TAPE (Sling) ×2 IMPLANT
SLING UTERINE/ABD GYNECARE TVT (Sling) ×2 IMPLANT
SPONGE LAP 4X18 X RAY DECT (DISPOSABLE) ×1 IMPLANT
SURGIFLO W/THROMBIN 8M KIT (HEMOSTASIS) ×1 IMPLANT
SUT VIC AB 0 CT1 18XCR BRD8 (SUTURE) ×2 IMPLANT
SUT VIC AB 0 CT1 27 (SUTURE) ×6
SUT VIC AB 0 CT1 27XBRD ANBCTR (SUTURE) ×4 IMPLANT
SUT VIC AB 0 CT1 8-18 (SUTURE) ×3
SUT VIC AB 2-0 CT1 27 (SUTURE) ×6
SUT VIC AB 2-0 CT1 TAPERPNT 27 (SUTURE) ×8 IMPLANT
SUT VIC AB 2-0 SH 27 (SUTURE) ×6
SUT VIC AB 2-0 SH 27XBRD (SUTURE) ×6 IMPLANT
SUT VIC AB 2-0 UR6 27 (SUTURE) IMPLANT
SYR 50ML LL SCALE MARK (SYRINGE) IMPLANT
TOWEL OR 17X24 6PK STRL BLUE (TOWEL DISPOSABLE) ×6 IMPLANT
TRAY FOLEY CATH 14FR (SET/KITS/TRAYS/PACK) ×3 IMPLANT
WATER STERILE IRR 1000ML POUR (IV SOLUTION) ×3 IMPLANT

## 2012-02-25 NOTE — Discharge Summary (Signed)
Physician Discharge Summary  Patient ID: Cynthia [name] MRN: [id number] DOB/AGE: Dec 28, 1960 51 y.o.  Admit date: 02/25/2012 Discharge date: 02/25/2012  Admission Diagnoses:SUI, cystocel  Discharge Diagnoses: same Active Problems:  * No active hospital problems. *    Discharged Condition: good  Hospital Course: Pt d/ced DOS eating, ambulating and with foley.  Pt only has a scratchy throat.  Vag pack and foley are in; vag pack to be removed tomorrow in office and foley will need to stay for 1 week.  Consults: None   Treatments: surgery: TVT, A repair, cystoscopy  Discharge Exam: Blood pressure 103/62, pulse 74, temperature 97.9 F (36.6 C), temperature source Oral, resp. rate 18, height 5' 4"  (1.626 m), weight 116.574 kg (257 lb), last menstrual period 06/07/2011, SpO2 97.00%.   Disposition: 01-Home or Self Care  Discharge Orders    Future Orders Please Complete By Expires   Diet - low sodium heart healthy      Discharge instructions      Comments:   No driving on narcotics, no sexual activity for 2 weeks.   Increase activity slowly      May shower / Bathe      Comments:   Shower, no bath for 2 weeks.   Sexual Activity Restrictions      Comments:   No sexual activity for 2 weeks.   Remove dressing in 24 hours      Call MD for:  temperature >100.4        Medication List  As of 02/25/2012  5:24 PM   STOP taking these medications         diphenhydramine-acetaminophen 25-500 MG Tabs         TAKE these medications         atenolol-chlorthalidone 50-25 MG per tablet   Commonly known as: TENORETIC   Take 1 tablet by mouth daily.      cefUROXime 250 MG tablet   Commonly known as: CEFTIN   Take 1 tablet (250 mg total) by mouth 2 (two) times daily.      cyclobenzaprine 10 MG tablet   Commonly known as: FLEXERIL   Take 1 tablet (10 mg total) by mouth 3 (three) times daily as needed.      esomeprazole 40 MG capsule   Commonly known as: NEXIUM   Take 1 capsule  (40 mg total) by mouth 2 (two) times daily.      mulitivitamin with minerals Tabs   Take 1 tablet by mouth daily. UsesOne-A-Day Menopause Formula      OVER THE COUNTER MEDICATION   Place 3 tablets under the tongue at bedtime. Restfull Legs herbal supplement      oxyCODONE-acetaminophen 5-325 MG per tablet   Commonly known as: PERCOCET   Take 1 tablet by mouth every 4 (four) hours as needed for pain.      phenazopyridine 200 MG tablet   Commonly known as: PYRIDIUM   Take 1 tablet (200 mg total) by mouth 3 (three) times daily as needed for pain.      pramipexole 1 MG tablet   Commonly known as: MIRAPEX   Take 1 tablet (1 mg total) by mouth at bedtime.      SUMAtriptan 100 MG tablet   Commonly known as: IMITREX   Take 1 tablet (100 mg total) by mouth every 2 (two) hours as needed.      SYSTANE 0.4-0.3 % Soln   Generic drug: Polyethyl Glycol-Propyl Glycol   Apply 1 drop to eye  as needed. For Dry Eyes.      venlafaxine XR 150 MG 24 hr capsule   Commonly known as: EFFEXOR-XR   Take 1 capsule (150 mg total) by mouth 2 (two) times daily.             Signed: Braydyn Schultes A 02/25/2012, 5:24 PM

## 2012-02-25 NOTE — Anesthesia Procedure Notes (Signed)
Procedure Name: Intubation Performed by: Brock Ra Pre-anesthesia Checklist: Patient identified, Emergency Drugs available, Suction available, Patient being monitored and Timeout performed Patient Re-evaluated:Patient Re-evaluated prior to inductionOxygen Delivery Method: Circle system utilized Preoxygenation: Pre-oxygenation with 100% oxygen Intubation Type: IV induction Ventilation: Mask ventilation without difficulty Laryngoscope Size: Mac and 3 Grade View: Grade IV Tube size: 7.0 mm Number of attempts: 3 Airway Equipment and Method: Video-laryngoscopy Placement Confirmation: ETT inserted through vocal cords under direct vision,  breath sounds checked- equal and bilateral and positive ETCO2 Secured at: 21 cm Tube secured with: Tape Difficulty Due To: Difficulty was unanticipated, Difficult Airway-  due to edematous airway and Difficult Airway- due to limited oral opening Future Recommendations: Recommend- induction with short-acting agent, and alternative techniques readily available

## 2012-02-25 NOTE — Preoperative (Signed)
Beta Blockers   Reason not to administer Beta Blockers: Beta blocker taken as prescribed at home per patient

## 2012-02-25 NOTE — Progress Notes (Signed)
I d/w the family and will d/w the patient the complication of a perforated bladder and the need for wearing the foley for a full week.

## 2012-02-25 NOTE — Progress Notes (Signed)
Discharge instructions reviewed with patient and family.  Instructions and demonstration given to patient and family on how to attach leg bag and reattach foley bag.  Patient and sister state understanding of instructions and returned demonstration of bags.  Wheelchair to car with staff without incident.  Discharged home with family.

## 2012-02-25 NOTE — Anesthesia Postprocedure Evaluation (Signed)
  Anesthesia Post-op Note  Patient: Cynthia [name]  Procedure(s) Performed: Procedure(s) (LRB): TRANSVAGINAL TAPE (TVT) PROCEDURE (N/A) CYSTOSCOPY (N/A) ANTERIOR (CYSTOCELE) AND POSTERIOR REPAIR (RECTOCELE) (N/A)  Patient Location: PACU  Anesthesia Type: General  Level of Consciousness: awake, alert  and oriented  Airway and Oxygen Therapy: Patient Spontanous Breathing and Patient connected to face mask oxygen  Post-op Pain: mild  Post-op Assessment: Post-op Vital signs reviewed, Patient's Cardiovascular Status Stable, Respiratory Function Stable, Patent Airway, No signs of Nausea or vomiting and Pain level controlled  Post-op Vital Signs: Reviewed and stable  Complications: No apparent anesthesia complications. Difficult intubation. Glidescope x 1 by CRNA, Glidescope x1 by MD. Patient informed of difficult intubation.

## 2012-02-25 NOTE — Addendum Note (Signed)
Addendum  created 02/25/12 1519 by Ignacia Bayley, CRNA   Modules edited:Notes Section

## 2012-02-25 NOTE — Anesthesia Postprocedure Evaluation (Signed)
  Anesthesia Post-op Note  Patient: Cynthia [name]  Procedure(s) Performed: Procedure(s) (LRB): TRANSVAGINAL TAPE (TVT) PROCEDURE (N/A) CYSTOSCOPY (N/A) ANTERIOR (CYSTOCELE) AND POSTERIOR REPAIR (RECTOCELE) (N/A)  Patient Location: [facility]  Anesthesia Type: General  Level of Consciousness: awake  Airway and Oxygen Therapy: Patient Spontanous Breathing  Post-op Pain: mild  Post-op Assessment: Patient's Cardiovascular Status Stable and Respiratory Function Stable  Post-op Vital Signs: stable  Complications: No apparent anesthesia complications

## 2012-02-25 NOTE — Discharge Instructions (Signed)
Please come to office for vaginal pack removal tomorrow 8 am.  Pt to keep foley in for one week; please show how to change leg and big bags.

## 2012-02-25 NOTE — Progress Notes (Signed)
There has been no change in the patients history, status or exam since the history and physical.  Filed Vitals:   02/25/12 0903  BP: 120/74  Pulse: 72  Temp: 98.1 F (36.7 C)  TempSrc: Oral  Resp: 16  SpO2: 98%    Lab Results  Component Value Date   WBC 7.1 02/16/2012   HGB 10.5* 02/16/2012   HCT 34.0* 02/16/2012   MCV 85.9 02/16/2012   PLT 281 02/16/2012    Cynthia [name] A

## 2012-02-25 NOTE — Anesthesia Preprocedure Evaluation (Addendum)
Anesthesia Evaluation  Patient identified by MRN, date of birth, ID band Patient awake    Reviewed: Allergy & Precautions, H&P , NPO status , Patient's Chart, lab work & pertinent test results, reviewed documented beta blocker date and time   Airway Mallampati: I TM Distance: >3 FB Neck ROM: full    Dental No notable dental hx. (+) Teeth Intact   Pulmonary  breath sounds clear to auscultation  Pulmonary exam normal       Cardiovascular hypertension, On Medications and On Home Beta Blockers + Valvular Problems/Murmurs MVP Rhythm:regular Rate:Normal     Neuro/Psych  Headaches, PSYCHIATRIC DISORDERS Restless Legs Syndrome    GI/Hepatic hiatal hernia, GERD-  ,Irritable Bowel Syndrome    Endo/Other  Morbid obesity  Renal/GU  Bladder dysfunction Female GU complaint     Musculoskeletal   Abdominal Normal abdominal exam  (+)   Peds  Hematology  (+) anemia ,   Anesthesia Other Findings   Reproductive/Obstetrics                          Anesthesia Physical Anesthesia Plan  ASA: III  Anesthesia Plan: General ETT   Post-op Pain Management:    Induction:   Airway Management Planned:   Additional Equipment:   Intra-op Plan:   Post-operative Plan:   Informed Consent: I have reviewed the patients History and Physical, chart, labs and discussed the procedure including the risks, benefits and alternatives for the proposed anesthesia with the patient or authorized representative who has indicated his/her understanding and acceptance.   Dental Advisory Given  Plan Discussed with: Anesthesiologist, CRNA and Surgeon  Anesthesia Plan Comments:         Anesthesia Quick Evaluation

## 2012-02-25 NOTE — OR Nursing (Signed)
Procedure performed by Dr. Philis Pique on 02/25/2012 TVT, cystoscopy, and Anterior cystocele Repair.

## 2012-02-25 NOTE — Transfer of Care (Signed)
Immediate Anesthesia Transfer of Care Note  Patient: Cynthia [name]  Procedure(s) Performed: Procedure(s) (LRB): TRANSVAGINAL TAPE (TVT) PROCEDURE (N/A) CYSTOSCOPY (N/A) ANTERIOR (CYSTOCELE) AND POSTERIOR REPAIR (RECTOCELE) (N/A)  Patient Location: PACU  Anesthesia Type: General  Level of Consciousness: awake, oriented and patient cooperative  Airway & Oxygen Therapy: Patient Spontanous Breathing and Patient connected to nasal cannula oxygen  Post-op Assessment: Report given to PACU RN and Post -op Vital signs reviewed and stable  Post vital signs: Reviewed and stable  Complications: No apparent anesthesia complications

## 2012-02-25 NOTE — Op Note (Signed)
02/25/2012  12:14 PM  PATIENT:  Cynthia [name]  51 y.o. female  PRE-OPERATIVE DIAGNOSIS:  Stress Uterine Incontinence CYSTOCELE   POST-OPERATIVE DIAGNOSIS:  Stress Uterine Incontinence CYSTOCELE   PROCEDURE:  Procedure(s) (LRB): TRANSVAGINAL TAPE (TVT) PROCEDURE (N/A) CYSTOSCOPY (N/A) ANTERIOR (CYSTOCELE)REPAIR   SURGEON:  Surgeon(s) and Role:    * Daria Pastures, MD - Primary    * W Delene Loll, MD - Assisting  ANESTHESIA:   general  EBL:  Total I/O In: -  Out: 300 [Blood:300]  SPECIMEN:  Source of Specimen:  none  DISPOSITION OF SPECIMEN:  N/A  COUNTS:  YES  PLAN OF CARE: pending  PATIENT DISPOSITION:  PACU - hemodynamically stable.   Delay start of Pharmacological VTE agent (>24hrs) due to surgical blood loss or risk of bleeding: not applicable  Findings:  Cystocele -2. Bilatreral spill of indigo after placement of the last set of needles from ureteral orifaces.  Complications:  The needles were placed without puncture of bladder at first but when the tape was placed it was to near the bladder neck.  The tape was removed and the needles replaced.  Cysto done showed that the left needle caught and punctured the top of the dome and the needle was removed.  The foley was found not to be draining and it was replaced.  The left needle was replaced and cysto showed that the bladder was intact this time.  The dome and sides were clear of any needles and both ureteral orifaces were spilling indigo carmine.    Meds: indigo carmine.  Surgiflow.  After general anesthesia was administered, the patient was prepped and draped in the usual sterile fashion.  The uterus was well suspended and we elected to leave it in place.  A foley was placed in the urethra and the apex of the cystocele grasped with two allises.  Allises were used to grasp the vaginal mucosa in the midline superior to inferior just below the urethral opening.  The mucosa was injected with 1% lidocaine with  epi in the midline and a scalpel used to incise the vaginal mucosa vertically.  Allises were used to retract the vaginal mucosa as the vesico-vaginal fascia was removed from the mucosa with blunt and sharp dissection and adequate room midurethral to pelvic floor bilaterally was developed.  Two small puncture incisions were then made two cm from midline just above the pubic symphysis.  The abdominal needles from the Gynecare TVT set were introduced behind the pubic bone, through the pelvic floor and out the vagina carefully on each respective side, being careful to hug the posterior of the pubic bone.   Cysto was done and he needles were placed without puncture of bladder at first but when the tape was placed it was to near the bladder neck.  The tape was removed and the needles replaced.  Cysto done showed that the left needle caught and punctured the top of the dome and the needle was removed.  The foley was found not to be draining and it was replaced.  The left needle was replaced and cysto showed that the bladder was intact this time.  The dome and sides were clear of any needles and both ureteral orifaces were spilling indigo carmine. The urethra was clear as well.  The foley was replaced and the vaginal needles attached to the abdominal needles.  The tape was pulled into place through the abdominal incisions, the sheaths removed while a kelly was in place under the  mesh sling and the tape cut at the level of just below the skin.  The tape was checked and found to be tight enough and laying flat and now midurethral. A mattress stitch was placed below the tape to close the vesicovaginal fascia and the cystocele.  At this point two additional stitches were placed to close a small bleeder but did not achieve hemostasis.  Surgiflow was then placed in the corners up by the pelvic floor and under the urethra to achieve hemostasis of these small bleeders out of reach.  Once hemostasis was achieved, the vaginal mucosa  was trimmed and then closed with a running lock stitch of 2-0 vicryl.  A vaginal packing with estrace was placed and the patient returned to the recovery room in stable condition.

## 2012-02-25 NOTE — Brief Op Note (Signed)
02/25/2012  12:14 PM  PATIENT:  Cynthia [name]  51 y.o. female  PRE-OPERATIVE DIAGNOSIS:  Stress Uterine Incontinence CYSTOCELE   POST-OPERATIVE DIAGNOSIS:  Stress Uterine Incontinence CYSTOCELE   PROCEDURE:  Procedure(s) (LRB): TRANSVAGINAL TAPE (TVT) PROCEDURE (N/A) CYSTOSCOPY (N/A) ANTERIOR (CYSTOCELE)REPAIR   SURGEON:  Surgeon(s) and Role:    * Daria Pastures, MD - Primary    * W Delene Loll, MD - Assisting  ANESTHESIA:   general  EBL:  Total I/O In: -  Out: 300 [Blood:300]  SPECIMEN:  Source of Specimen:  none  DISPOSITION OF SPECIMEN:  N/A  COUNTS:  YES  PLAN OF CARE: pending  PATIENT DISPOSITION:  PACU - hemodynamically stable.   Delay start of Pharmacological VTE agent (>24hrs) due to surgical blood loss or risk of bleeding: not applicable  Findings:  Cystocele -2. Bilatreral spill of indigo after placement of the last set of needles from ureteral orifaces.  Complications:  The needles were placed without puncture of bladder at first but when the tape was placed it was to near the bladder neck.  The tape was removed and the needles replaced.  Cysto done showed that the left needle caught and punctured the top of the dome and the needle was removed.  The foley was found not to be draining and it was replaced.  The left needle was replaced and cysto showed that the bladder was intact this time.  The dome and sides were clear of any needles and both ureteral orifaces were spilling indigo carmine.    Meds: indigo carmine.  Surgiflow.  After general anesthesia was administered, the patient was prepped and draped in the usual sterile fashion.  The uterus was well suspended and we elected to leave it in place.  A foley was placed in the urethra and the apex of the cystocele grasped with two allises.  Allises were used to grasp the vaginal mucosa in the midline superior to inferior just below the urethral opening.  The mucosa was injected with 1% lidocaine with  epi in the midline and a scalpel used to incise the vaginal mucosa vertically.  Allises were used to retract the vaginal mucosa as the vesico-vaginal fascia was removed from the mucosa with blunt and sharp dissection and adequate room midurethral to pelvic floor bilaterally was developed.  Two small puncture incisions were then made two cm from midline just above the pubic symphysis.  The abdominal needles from the Gynecare TVT set were introduced behind the pubic bone, through the pelvic floor and out the vagina carefully on each respective side, being careful to hug the posterior of the pubic bone.   Cysto was done and he needles were placed without puncture of bladder at first but when the tape was placed it was to near the bladder neck.  The tape was removed and the needles replaced.  Cysto done showed that the left needle caught and punctured the top of the dome and the needle was removed.  The foley was found not to be draining and it was replaced.  The left needle was replaced and cysto showed that the bladder was intact this time.  The dome and sides were clear of any needles and both ureteral orifaces were spilling indigo carmine. The urethra was clear as well.  The foley was replaced and the vaginal needles attached to the abdominal needles.  The tape was pulled into place through the abdominal incisions, the sheaths removed while a kelly was in place under the  mesh sling and the tape cut at the level of just below the skin.  The tape was checked and found to be tight enough and laying flat and now midurethral. A mattress stitch was placed below the tape to close the vesicovaginal fascia and the cystocele.  At this point two additional stitches were placed to close a small bleeder but did not achieve hemostasis.  Surgiflow was then placed in the corners up by the pelvic floor and under the urethra to achieve hemostasis of these small bleeders out of reach.  Once hemostasis was achieved, the vaginal mucosa  was trimmed and then closed with a running lock stitch of 2-0 vicryl.  A vaginal packing with estrace was placed and the patient returned to the recovery room in stable condition.       Panzy Bubeck A

## 2012-02-25 NOTE — Progress Notes (Signed)
Patient is eating, ambulating, foley is in.  Pain control is good.  She has a slight sore throat but tolerating po well.  BP 103/62  Pulse 74  Temp(Src) 97.9 F (36.6 C) (Oral)  Resp 18  Ht 5' 4"  (1.626 m)  Wt 116.574 kg (257 lb)  BMI 44.11 kg/m2  SpO2 97%  LMP 06/07/2011  lungs:   clear to auscultation cor:    RRR Abdomen:  soft, appropriate tenderness, incisions intact and without erythema or exudate. ex:    no cords   Lab Results  Component Value Date   WBC 7.1 02/16/2012   HGB 10.5* 02/16/2012   HCT 34.0* 02/16/2012   MCV 85.9 02/16/2012   PLT 281 02/16/2012    A/P  Routine care.  Expect d/c per plan tonight.

## 2012-03-01 ENCOUNTER — Encounter (HOSPITAL_COMMUNITY): Payer: Self-pay | Admitting: Obstetrics and Gynecology

## 2012-03-03 NOTE — Progress Notes (Signed)
UR chart review completed.  

## 2012-06-22 ENCOUNTER — Telehealth: Payer: Self-pay | Admitting: Family Medicine

## 2012-06-22 ENCOUNTER — Encounter: Payer: Self-pay | Admitting: Family Medicine

## 2012-06-22 ENCOUNTER — Ambulatory Visit (INDEPENDENT_AMBULATORY_CARE_PROVIDER_SITE_OTHER): Payer: BC Managed Care – PPO | Admitting: Family Medicine

## 2012-06-22 VITALS — BP 120/70 | HR 83 | Wt 274.0 lb

## 2012-06-22 DIAGNOSIS — Z6841 Body Mass Index (BMI) 40.0 and over, adult: Secondary | ICD-10-CM

## 2012-06-22 MED ORDER — PHENTERMINE HCL 30 MG PO CAPS: 30.0000 mg | ORAL_CAPSULE | ORAL | Status: DC

## 2012-06-22 NOTE — Telephone Encounter (Signed)
rx called in

## 2012-06-22 NOTE — Patient Instructions (Addendum)
Walking 5 minutes twice a day then after a week or so ago to 3 times per day and then increase by a minute to your walking about half an hour every day. Seen the nutritionist again

## 2012-06-22 NOTE — Progress Notes (Signed)
  Subjective:    Patient ID: Cynthia [name], female    DOB: November 09, 1960, 51 y.o.   MRN: [id number]  HPI Since August of last year she has had a hysterectomy as well as bladder surgery. She also quit smoking February 3. She notes increased difficulty with weight gain, fatigue, myalgias and restricted breathing. She has been to a dietitian area she continues on medications listed in the chart. She is very frustrated over her weight gain and recognizes that all the problems she has had and her inability to exercise have all contributed to this. She continues on her Effexor and is not sure it is working well.   Review of Systems     Objective:   Physical Exam Alert and in no distress with a slightly flat affect and somewhat tearful       Assessment & Plan:   1. Morbid obesity with BMI of 40.0-44.9, adult  phentermine 30 MG capsule   I discussed options with her concerning all this. I will place her on phentermine to see if this will help give her a jump start. Encouraged her to start walking even if this does for 5 minutes at a time and slowly increasing this. Recommend she discuss her diet with the nutritionist again. Recheck here in one month.

## 2012-07-14 ENCOUNTER — Encounter: Payer: Self-pay | Admitting: Medical

## 2012-07-14 ENCOUNTER — Ambulatory Visit (INDEPENDENT_AMBULATORY_CARE_PROVIDER_SITE_OTHER): Payer: BC Managed Care – PPO | Admitting: Medical

## 2012-07-14 VITALS — BP 134/86 | HR 72 | Temp 98.2°F | Ht 64.0 in | Wt 268.0 lb

## 2012-07-14 DIAGNOSIS — G4700 Insomnia, unspecified: Secondary | ICD-10-CM

## 2012-07-14 DIAGNOSIS — R609 Edema, unspecified: Secondary | ICD-10-CM

## 2012-07-14 DIAGNOSIS — E669 Obesity, unspecified: Secondary | ICD-10-CM

## 2012-07-14 DIAGNOSIS — M542 Cervicalgia: Secondary | ICD-10-CM

## 2012-07-14 DIAGNOSIS — I872 Venous insufficiency (chronic) (peripheral): Secondary | ICD-10-CM

## 2012-07-14 NOTE — Progress Notes (Signed)
Subjective:   HPI  Cynthia [name] is a 51 y.o. female who presents with multiple c/o.   She is here with her sister Juliann Pulse today who works here.  She notes quitting tobacco in February of this year.  Since then has gained 35lb.  Over the last several months she notes swelling in her legs.  The swelling often causes pain due to the fullness. It does improve with elevation of legs and by morning.  She denies swelling anywhere else besides legs.    She had a complete hysterectomy in 06/2011 due to heavy bleeding, and had bladder tack surgery shortly after that.  She is not currently exercising.    She does have increased stress.  She lost her job last year after many years with that company.  She has started a new business recently and this has certainly increased stress.  She has sleep problems.   Sometimes has trouble getting to sleep and staying asleep.  She denies apnea, snoring.  She denies CP, SOB, DOE, oliguria,palpitations, fever, night sweats.  She does note eat a low salt diet.    She has had a colonoscopy prior without cancerous findings.     She has a neck c/o.  Since she has gained weight, she notes a clicking in her neck.  Its not really painful, but happens sometimes when swallowing.   She denies difficulty swallowing, no food getting stuck, no neck mass, no change in ROM, no stiffness.  No other c/o.  The following portions of the patient's history were reviewed and updated as appropriate: allergies, current medications, past family history, past medical history, past social history, past surgical history and problem list.  Past Medical History  Diagnosis Date  . Hypertension   . Heart murmur   . GERD (gastroesophageal reflux disease)   . Headache     MIGRAINE  . Anxiety   . Depression   . Incontinence   . IBS (irritable bowel syndrome)   . MVP (mitral valve prolapse)   . Hiatal hernia   . RLS (restless legs syndrome)   . Anemia 2012    Allergies  Allergen Reactions   . Iodine     REACTION: in eye gtts   Review of Systems ROS reviewed and was negative other than noted in HPI or above.    Objective:   Physical Exam  General appearance: alert, no distress, WD/WN Oral cavity: MMM, no lesions Neck: +click felt on left neck round the cricoid cartilage with swallowing or certain movement, but its random.  otherwise supple, no lymphadenopathy, no thyromegaly, no masses Heart: RRR, normal S1, S2, no murmurs Lungs: CTA bilaterally, no wheezes, rhonchi, or rales Pulses: 2+ symmetric, upper and lower extremities, normal cap refill Ext: 2+ LE edema including calve to feet but not toes, non pitting   Assessment and Plan :     Encounter Diagnoses  Name Primary?  . Venous insufficiency Yes  . Edema   . Obesity   . Neck ache    Venous insufficiency - script for Ted hose.  Advised leg elevation, low salt diet, exercise more, work on weight loss  Edema - script for Ted hose.  Advised leg elevation, low salt diet, exercise more, work on weight loss  Obesity - discussed need for diet and exercise changes, use the My Fitness Pal app to maintain 1400 calories daily, and work on weight loss  Neck ache/neck anomaly - etiology not clear but appears to be clicking of cricoid cartilage.  Offered MRI to rule out mass since this is her main concern.  She will let me know.  Insomnia - begin trial of OTC Melatonin

## 2012-07-22 ENCOUNTER — Ambulatory Visit (INDEPENDENT_AMBULATORY_CARE_PROVIDER_SITE_OTHER): Payer: BC Managed Care – PPO | Admitting: Family Medicine

## 2012-07-22 ENCOUNTER — Encounter: Payer: Self-pay | Admitting: Family Medicine

## 2012-07-22 VITALS — BP 118/78 | HR 76 | Wt 266.0 lb

## 2012-07-22 DIAGNOSIS — E669 Obesity, unspecified: Secondary | ICD-10-CM

## 2012-07-22 DIAGNOSIS — Z6841 Body Mass Index (BMI) 40.0 and over, adult: Secondary | ICD-10-CM

## 2012-07-22 DIAGNOSIS — Z23 Encounter for immunization: Secondary | ICD-10-CM

## 2012-07-22 MED ORDER — PHENTERMINE HCL 30 MG PO CAPS: 30.0000 mg | ORAL_CAPSULE | ORAL | Status: DC

## 2012-07-22 NOTE — Patient Instructions (Signed)
Be creative with your lunch meal. Continue with your exercising

## 2012-07-22 NOTE — Progress Notes (Signed)
  Subjective:    Patient ID: Cynthia [name], female    DOB: 08/10/1961, 51 y.o.   MRN: [id number]  HPI He is here for recheck. She is walking more regularly. She does have a problem with eating fast food at work. She has lost 8 pounds. She would like a refill on her medication He also has an intermittent popping sensation on the left side of her neck with swallowing.  Review of Systems     Objective:   Physical Exam Alert and in no distress. Weight was reviewed. Neck exam shows no lesions. Popping sensation was palpable with swallowing.       Assessment & Plan:   1. Obesity (BMI 30-39.9)    2. Morbid obesity with BMI of 40.0-44.9, adult  phentermine 30 MG capsule   patient was also given a flu shot. Risks and benefits discussed If the popping continues we will send to ENT

## 2012-08-31 ENCOUNTER — Telehealth: Payer: Self-pay | Admitting: Family Medicine

## 2012-09-02 ENCOUNTER — Telehealth: Payer: Self-pay | Admitting: Family Medicine

## 2012-09-02 NOTE — Telephone Encounter (Signed)
lm

## 2012-09-02 NOTE — Telephone Encounter (Signed)
LM

## 2012-09-13 ENCOUNTER — Other Ambulatory Visit: Payer: Self-pay | Admitting: Family Medicine

## 2012-11-01 ENCOUNTER — Other Ambulatory Visit: Payer: Self-pay | Admitting: Family Medicine

## 2012-11-02 NOTE — Telephone Encounter (Signed)
Is it all right to refill the Effexor?

## 2012-12-22 ENCOUNTER — Encounter: Payer: Self-pay | Admitting: Medical

## 2012-12-22 ENCOUNTER — Ambulatory Visit (INDEPENDENT_AMBULATORY_CARE_PROVIDER_SITE_OTHER): Payer: BC Managed Care – PPO | Admitting: Medical

## 2012-12-22 VITALS — BP 120/80 | HR 78 | Temp 98.1°F | Resp 14 | Wt 257.0 lb

## 2012-12-22 DIAGNOSIS — J988 Other specified respiratory disorders: Secondary | ICD-10-CM

## 2012-12-22 DIAGNOSIS — N760 Acute vaginitis: Secondary | ICD-10-CM

## 2012-12-22 DIAGNOSIS — R059 Cough, unspecified: Secondary | ICD-10-CM

## 2012-12-22 DIAGNOSIS — R062 Wheezing: Secondary | ICD-10-CM

## 2012-12-22 DIAGNOSIS — J22 Unspecified acute lower respiratory infection: Secondary | ICD-10-CM

## 2012-12-22 DIAGNOSIS — R490 Dysphonia: Secondary | ICD-10-CM

## 2012-12-22 MED ORDER — FLUCONAZOLE 100 MG PO TABS: ORAL_TABLET | ORAL | Status: DC

## 2012-12-22 MED ORDER — AZITHROMYCIN 250 MG PO TABS: ORAL_TABLET | ORAL | Status: DC

## 2012-12-22 NOTE — Progress Notes (Signed)
Subjective: Here for 6 days ago with head cold, chest congestion, lots of head congestion, drainage, coughing, some wheezing.  Drainage at time is green to bloody from nose.  Coughing up green sputum.  Not getting better.  Now voice is hoarse.   Former smoker, quit a year ago.  Over the time she was gone on the cruise was around lots of tobacco smoke in the casino on the ship.  Denies hx/o asthma, COPD.  Boyfriend had cold earlier in the week, not as bad as she is.  +sore throat, ears itch but no pain, not much sinus pressure.  Has had subjective fever.  Denies NVD.  Went on cruise recently, and has yeast infection.  Using OTC monistat.  Some improvement, but not completely gone.  She reports itching, burning with urination, white thick discharge.  Has had several yeast infections in teh past.    Past Medical History  Diagnosis Date  . Hypertension   . Heart murmur   . GERD (gastroesophageal reflux disease)   . Headache     MIGRAINE  . Anxiety   . Depression   . Incontinence   . IBS (irritable bowel syndrome)   . MVP (mitral valve prolapse)   . Hiatal hernia   . RLS (restless legs syndrome)   . Anemia 2012   ROS as in subjective    Objective: Filed Vitals:   12/22/12 1137  BP: 120/80  Pulse: 78  Temp: 98.1 F (36.7 C)    General appearance: alert, no distress, WD/WN HEENT: normocephalic, sclerae anicteric, TMs pearly, nares patent, no discharge or erythema, pharynx with erythema mild Oral cavity: MMM, no lesions Neck: supple, no lymphadenopathy, no thyromegaly, no masses Heart: RRR, normal S1, S2, no murmurs Lungs: bronchial breath sounds, scattered rhonchi, no wheezes, no rales Abdomen: +bs, soft, non tender, non distended, no masses, no hepatomegaly, no splenomegaly Pelvic exam deferred  Assessment: Encounter Diagnoses  Name Primary?  . Cough Yes  . Wheezing   . Lower respiratory infection   . Hoarseness   . Vaginitis and vulvovaginitis     Plan: Rest, increase  water intake, mucinex DM, Zpak script.   Diflucan script.  Instructions given.  Call/return if worse or not improving.

## 2013-02-25 ENCOUNTER — Other Ambulatory Visit: Payer: Self-pay | Admitting: Family Medicine

## 2013-02-25 NOTE — Telephone Encounter (Signed)
Is this ok?

## 2013-04-05 ENCOUNTER — Telehealth: Payer: Self-pay | Admitting: Family Medicine

## 2013-04-05 ENCOUNTER — Telehealth: Payer: Self-pay | Admitting: Internal Medicine

## 2013-04-05 MED ORDER — ESOMEPRAZOLE MAGNESIUM 40 MG PO CPDR: DELAYED_RELEASE_CAPSULE | ORAL | Status: DC

## 2013-04-05 MED ORDER — PRAMIPEXOLE DIHYDROCHLORIDE 1 MG PO TABS: 1.0000 mg | ORAL_TABLET | Freq: Every day | ORAL | Status: DC

## 2013-04-05 MED ORDER — VENLAFAXINE HCL ER 150 MG PO CP24: ORAL_CAPSULE | ORAL | Status: DC

## 2013-04-05 MED ORDER — ATENOLOL-CHLORTHALIDONE 50-25 MG PO TABS: 1.0000 | ORAL_TABLET | Freq: Every day | ORAL | Status: DC

## 2013-04-05 NOTE — Telephone Encounter (Signed)
schedule her to come in so we can discuss all this.

## 2013-04-05 NOTE — Telephone Encounter (Signed)
COULD NOT SEND IN FLEXARIL BUT SENT OTHER MEDS PT WILL MAKE APPT TO COME IN GAVE 90 DAYS

## 2013-04-05 NOTE — Telephone Encounter (Signed)
Refill request for venlafaxine Xr 187m, Cyclobenzaprine 171m nexium [id number] 90 day supply to [facility]

## 2013-04-05 NOTE — Telephone Encounter (Signed)
Pt has switched pharmacies. She needed a 30 day rx sent in to [facility] [address] and then her new pharmacy is [facility] which i have sent #[phone] with 1 refill

## 2013-04-05 NOTE — Telephone Encounter (Signed)
Pt got new pharmacy

## 2013-05-11 ENCOUNTER — Telehealth: Payer: Self-pay | Admitting: Family Medicine

## 2013-05-11 NOTE — Telephone Encounter (Signed)
Pt states ins has changed and nexium is now very expensive. Pt is requesting samples until she can work something out. I have 4 bottles of 5 in each bottle that I was going to  give to pt. Please inform me if that is ok.

## 2013-05-12 NOTE — Telephone Encounter (Signed)
Go ahead and give it to her.

## 2013-06-09 ENCOUNTER — Encounter: Payer: Self-pay | Admitting: Family Medicine

## 2013-06-09 ENCOUNTER — Ambulatory Visit (INDEPENDENT_AMBULATORY_CARE_PROVIDER_SITE_OTHER): Payer: BC Managed Care – PPO | Admitting: Family Medicine

## 2013-06-09 VITALS — BP 118/78 | HR 68 | Temp 98.3°F | Wt 256.0 lb

## 2013-06-09 DIAGNOSIS — K5289 Other specified noninfective gastroenteritis and colitis: Secondary | ICD-10-CM

## 2013-06-09 DIAGNOSIS — K529 Noninfective gastroenteritis and colitis, unspecified: Secondary | ICD-10-CM

## 2013-06-09 NOTE — Patient Instructions (Signed)
Viral Gastroenteritis Viral gastroenteritis is also known as stomach flu. This condition affects the stomach and intestinal tract. It can cause sudden diarrhea and vomiting. The illness typically lasts 3 to 8 days. Most people develop an immune response that eventually gets rid of the virus. While this natural response develops, the virus can make you quite ill. CAUSES  Many different viruses can cause gastroenteritis, such as rotavirus or noroviruses. You can catch one of these viruses by consuming contaminated food or water. You may also catch a virus by sharing utensils or other personal items with an infected person or by touching a contaminated surface. SYMPTOMS  The most common symptoms are diarrhea and vomiting. These problems can cause a severe loss of body fluids (dehydration) and a body salt (electrolyte) imbalance. Other symptoms may include:  Fever.  Headache.  Fatigue.  Abdominal pain. DIAGNOSIS  Your caregiver can usually diagnose viral gastroenteritis based on your symptoms and a physical exam. A stool sample may also be taken to test for the presence of viruses or other infections. TREATMENT  This illness typically goes away on its own. Treatments are aimed at rehydration. The most serious cases of viral gastroenteritis involve vomiting so severely that you are not able to keep fluids down. In these cases, fluids must be given through an intravenous line (IV). HOME CARE INSTRUCTIONS   Drink enough fluids to keep your urine clear or pale yellow. Drink small amounts of fluids frequently and increase the amounts as tolerated.  Ask your caregiver for specific rehydration instructions.  Avoid:  Foods high in sugar.  Alcohol.  Carbonated drinks.  Tobacco.  Juice.  Caffeine drinks.  Extremely hot or cold fluids.  Fatty, greasy foods.  Too much intake of anything at one time.  Dairy products until 24 to 48 hours after diarrhea stops.  You may consume probiotics.  Probiotics are active cultures of beneficial bacteria. They may lessen the amount and number of diarrheal stools in adults. Probiotics can be found in yogurt with active cultures and in supplements.  Wash your hands well to avoid spreading the virus.  Only take over-the-counter or prescription medicines for pain, discomfort, or fever as directed by your caregiver. Do not give aspirin to children. Antidiarrheal medicines are not recommended.  Ask your caregiver if you should continue to take your regular prescribed and over-the-counter medicines.  Keep all follow-up appointments as directed by your caregiver. SEEK IMMEDIATE MEDICAL CARE IF:   You are unable to keep fluids down.  You do not urinate at least once every 6 to 8 hours.  You develop shortness of breath.  You notice blood in your stool or vomit. This may look like coffee grounds.  You have abdominal pain that increases or is concentrated in one small area (localized).  You have persistent vomiting or diarrhea.  You have a fever.  The patient is a child younger than 3 months, and he or she has a fever.  The patient is a child older than 3 months, and he or she has a fever and persistent symptoms.  The patient is a child older than 3 months, and he or she has a fever and symptoms suddenly get worse.  The patient is a baby, and he or she has no tears when crying. MAKE SURE YOU:   Understand these instructions.  Will watch your condition.  Will get help right away if you are not doing well or get worse. Document Released: 10/20/2005 Document Revised: 01/12/2012 Document Reviewed: 08/06/2011  ExitCare Patient Information 2014 [place]. Try Emetrol for the nausea and Imodium for the diarrhea. You can eat practically anything but avoid which listed in the paperwork to help cut down on problem

## 2013-06-09 NOTE — Progress Notes (Signed)
  Subjective:    Patient ID: Cynthia [name], female    DOB: [birth year], 52 y.o.   MRN: [id number]  HPI She started 5 days ago with nausea, vomiting, midepigastric pain and diarrhea. The vomiting has stopped over the nausea, abdominal pain and diarrhea have continued. She has had some chills; the diarrhea has been loose and watery but no blood or pus. She has not tried anything for the nausea or diarrhea the   Review of Systems     Objective:   Physical Exam alert and in no distress. Tympanic membranes and canals are normal. Throat is clear. Tonsils are normal. Neck is supple without adenopathy or thyromegaly. Cardiac exam shows a regular sinus rhythm without murmurs or gallops. Lungs are clear to auscultation. Abdominal exam shows active bowel sounds no tenderness or rebound       Assessment & Plan:  Acute gastroenteritis  instructions given in the AVS.

## 2013-06-10 ENCOUNTER — Telehealth: Payer: Self-pay | Admitting: Family Medicine

## 2013-06-10 MED ORDER — ONDANSETRON HCL 4 MG PO TABS: 4.0000 mg | ORAL_TABLET | Freq: Three times a day (TID) | ORAL | Status: DC | PRN

## 2013-06-10 NOTE — Telephone Encounter (Signed)
Pt was seen yest. Is still having issues. Pt is requesting nausea med be sent in. She would like to have this sent to a DIFFERENT PHARMACY from what she usually uses. This is for this only. Pt is requesting it be sent to [address] in [place].

## 2013-06-10 NOTE — Telephone Encounter (Signed)
I called the medication out to her pharmacy per Dr. Susann Givens. CLS

## 2013-06-10 NOTE — Telephone Encounter (Signed)
Call in ondansetron 4 mg one tablet q. 6H when necessary nausea. 12 pills.

## 2013-06-14 ENCOUNTER — Ambulatory Visit: Payer: BC Managed Care – PPO | Admitting: Medical

## 2013-09-19 ENCOUNTER — Ambulatory Visit (INDEPENDENT_AMBULATORY_CARE_PROVIDER_SITE_OTHER): Payer: BC Managed Care – PPO | Admitting: Family Medicine

## 2013-09-19 ENCOUNTER — Encounter: Payer: Self-pay | Admitting: Family Medicine

## 2013-09-19 VITALS — BP 124/80 | HR 79 | Ht 64.5 in | Wt 254.0 lb

## 2013-09-19 DIAGNOSIS — E669 Obesity, unspecified: Secondary | ICD-10-CM

## 2013-09-19 DIAGNOSIS — Z23 Encounter for immunization: Secondary | ICD-10-CM

## 2013-09-19 DIAGNOSIS — G44229 Chronic tension-type headache, not intractable: Secondary | ICD-10-CM

## 2013-09-19 DIAGNOSIS — Z79899 Other long term (current) drug therapy: Secondary | ICD-10-CM

## 2013-09-19 DIAGNOSIS — R609 Edema, unspecified: Secondary | ICD-10-CM

## 2013-09-19 DIAGNOSIS — K219 Gastro-esophageal reflux disease without esophagitis: Secondary | ICD-10-CM

## 2013-09-19 DIAGNOSIS — G2581 Restless legs syndrome: Secondary | ICD-10-CM

## 2013-09-19 LAB — COMPREHENSIVE METABOLIC PANEL
ALT: 100 U/L — ABNORMAL HIGH (ref 0–35)
AST: 90 U/L — ABNORMAL HIGH (ref 0–37)
Albumin: 3.8 g/dL (ref 3.5–5.2)
Alkaline Phosphatase: 119 U/L — ABNORMAL HIGH (ref 39–117)
BUN: 12 mg/dL (ref 6–23)
CO2: 31 mEq/L (ref 19–32)
Calcium: 8.9 mg/dL (ref 8.4–10.5)
Chloride: 101 mEq/L (ref 96–112)
Creat: 0.78 mg/dL (ref 0.50–1.10)
Glucose, Bld: 113 mg/dL — ABNORMAL HIGH (ref 70–99)
Potassium: 3.2 mEq/L — ABNORMAL LOW (ref 3.5–5.3)
Sodium: 140 mEq/L (ref 135–145)
Total Bilirubin: 0.4 mg/dL (ref 0.3–1.2)
Total Protein: 6.8 g/dL (ref 6.0–8.3)

## 2013-09-19 LAB — CBC WITH DIFFERENTIAL/PLATELET
Basophils Absolute: 0 10*3/uL (ref 0.0–0.1)
Basophils Relative: 0 % (ref 0–1)
Eosinophils Absolute: 0.3 10*3/uL (ref 0.0–0.7)
Eosinophils Relative: 4 % (ref 0–5)
HCT: 38.2 % (ref 36.0–46.0)
Hemoglobin: 12.5 g/dL (ref 12.0–15.0)
Lymphocytes Relative: 20 % (ref 12–46)
Lymphs Abs: 1.3 10*3/uL (ref 0.7–4.0)
MCH: 26.5 pg (ref 26.0–34.0)
MCHC: 32.7 g/dL (ref 30.0–36.0)
MCV: 80.9 fL (ref 78.0–100.0)
Monocytes Absolute: 0.3 10*3/uL (ref 0.1–1.0)
Monocytes Relative: 5 % (ref 3–12)
Neutro Abs: 4.8 10*3/uL (ref 1.7–7.7)
Neutrophils Relative %: 71 % (ref 43–77)
Platelets: 292 10*3/uL (ref 150–400)
RBC: 4.72 MIL/uL (ref 3.87–5.11)
RDW: 14.7 % (ref 11.5–15.5)
WBC: 6.8 10*3/uL (ref 4.0–10.5)

## 2013-09-19 LAB — LIPID PANEL
Cholesterol: 198 mg/dL (ref 0–200)
HDL: 56 mg/dL (ref >39)
LDL Cholesterol: 110 mg/dL — ABNORMAL HIGH (ref 0–99)
Total CHOL/HDL Ratio: 3.5 Ratio
Triglycerides: 159 mg/dL — ABNORMAL HIGH (ref <150)
VLDL: 32 mg/dL (ref 0–40)

## 2013-09-19 NOTE — Progress Notes (Signed)
Teaching Physician: Sharlot Gowda, MD Dictated By: Judithann Graves  Subjective:  Cynthia [name] is a 52 y.o. female who presents for medication check. She has multiple concerns to address today:  She has PMH of migraines and now presents with headaches over the last 3-4 months that happen about twice per week. They seem to originate at the base of her neck and extend up throughout the occiput. She requires about 4 over the counter aleve to control these. There is no photophobia, phonophobia, no tearing, and no nausea or vomiting. She has no morning headaches and these recent headaches have begun in the afternoon. 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 This has been causing much stress. She has a previous history of migraine headaches and has been at [facility]. She started smoking again and admits to doing this to help reduce stress. During the past two weeks she has noted increasing intermittent epigastric pain. She describes a sensation of heartburn or reflux despite continuing to take her Nexium twice daily. She has no cough, and her symptoms are not exacerbated by lying down or consuming a meal just before bedtime. She mentions a sensation of fullness immediately following her meals. There is no nausea or regurgitation, and she reports no difficulty swallowing.  Over the last 2 days she has had an earache and head congestion. No fever, no cough, no throat pain, no headache at present or sinus pressure. She is not aware of any sick contacts.  She also takes Mirapex and is noting increased difficulty with restless legs earlier in the evening. Also complains of difficulty with bilateral lower extremity swelling after she stands for the entire day.   ROS as in subjective.    Objective: Filed Vitals:   09/19/13 0814  BP: 124/80  Pulse: 79    Physical Exam:  General: Alert and in no distress  HEENT: Tympanic membranes and canals are normal. Throat  is clear. Tonsils are normal.  Neck: Supple without adenopathy or thyromegaly. JVD < 5cm. CV: Regular sinus rhythm without murmurs or gallops Pulm: Lungs are clear to auscultation. No crackles. Ext: 2+ pitting edema extending to the knees bilaterally. No rash or ulceration.  Assessment and Plan: 1. GERD Her weight, recent increased NSAID use for tension headaches, and stress may be exacerbating reflux symptoms. Encouraged to continue her esomeprazole and to try to lose weight through diet and exercise. - CBC with Differential - Comprehensive metabolic panel  2. Dependent edema Likely non-cardiac in origin: no JVD, benign lung exam with no evidence of pleural effusion, and no gallop murmur. Suspect related to developing venous incompetence. Recommend compression stockings. - CBC with Differential - Comprehensive metabolic panel  3. Tension headache, chronic With stressful work situation currently. Encouraged to continue applying biofeedback methods learned for managing her migraines. Also recommend heat and stretching. Discussed smoking in relation to her headaches. Strongly encouraged her to quit smoking again. She will make a concerted effort to do this. 4. RLS (restless legs syndrome) Continue pramipexole nightly. Needs to be taken roughly half-an-hour before going to bed.  5. Need for prophylactic vaccination and inoculation against influenza - Flu Vaccine QUAD 36+ mos IM  6. Obesity (BMI 30-39.9) - CBC with Differential - Comprehensive metabolic panel - Lipid panel  7. Encounter for long-term (current) use of other medications - CBC with Differential - Comprehensive metabolic panel - Lipid panel  8. Earache and congestion: Reassuring exam points to the possibility that this  represents viral URI. Recommend supportive care.    Dr. Susann Givens was present for the encounter and agrees with the above assessment and plan.

## 2013-09-19 NOTE — Patient Instructions (Signed)
Make sure you do some stretching and use heat but be proactive. Start during the morning. Be proactive and take some breaks and do some deep breathing. Remember the serenity prayer

## 2013-09-20 NOTE — Progress Notes (Signed)
Quick Note:  CALLED PT TO INFORM HER OF LABS AND MAILED COPY TO PT ______

## 2013-09-21 ENCOUNTER — Other Ambulatory Visit: Payer: Self-pay

## 2013-09-21 ENCOUNTER — Telehealth: Payer: Self-pay | Admitting: Family Medicine

## 2013-09-21 MED ORDER — VENLAFAXINE HCL ER 150 MG PO CP24: ORAL_CAPSULE | ORAL | Status: DC

## 2013-09-21 NOTE — Telephone Encounter (Signed)
Pt needs refill on effexor. Pt was just in.

## 2013-09-21 NOTE — Telephone Encounter (Signed)
DONE

## 2013-09-21 NOTE — Telephone Encounter (Signed)
SENT IN [facility]

## 2013-09-22 ENCOUNTER — Telehealth: Payer: Self-pay | Admitting: Family Medicine

## 2013-09-22 MED ORDER — PRAMIPEXOLE DIHYDROCHLORIDE 1 MG PO TABS: 1.0000 mg | ORAL_TABLET | Freq: Every day | ORAL | Status: DC

## 2013-09-22 MED ORDER — ATENOLOL-CHLORTHALIDONE 50-25 MG PO TABS: 1.0000 | ORAL_TABLET | Freq: Every day | ORAL | Status: DC

## 2013-09-22 MED ORDER — CYCLOBENZAPRINE HCL 10 MG PO TABS: 10.0000 mg | ORAL_TABLET | Freq: Three times a day (TID) | ORAL | Status: DC | PRN

## 2013-09-22 MED ORDER — VENLAFAXINE HCL ER 150 MG PO CP24: ORAL_CAPSULE | ORAL | Status: DC

## 2013-09-22 MED ORDER — ESOMEPRAZOLE MAGNESIUM 40 MG PO CPDR: DELAYED_RELEASE_CAPSULE | ORAL | Status: DC

## 2013-09-22 NOTE — Telephone Encounter (Signed)
Pt needs all of her meds sent to [place]. Atenolol,effexor,mirapex,nevium,flexeril. Pt was just here Monday and meds were not send in

## 2013-10-25 ENCOUNTER — Other Ambulatory Visit: Payer: BC Managed Care – PPO

## 2013-10-25 DIAGNOSIS — R899 Unspecified abnormal finding in specimens from other organs, systems and tissues: Secondary | ICD-10-CM

## 2013-10-25 LAB — COMPREHENSIVE METABOLIC PANEL
ALT: 14 U/L (ref 0–35)
AST: 16 U/L (ref 0–37)
Albumin: 3.9 g/dL (ref 3.5–5.2)
Alkaline Phosphatase: 99 U/L (ref 39–117)
BUN: 13 mg/dL (ref 6–23)
CO2: 32 mEq/L (ref 19–32)
Calcium: 9.1 mg/dL (ref 8.4–10.5)
Chloride: 99 mEq/L (ref 96–112)
Creat: 0.83 mg/dL (ref 0.50–1.10)
Glucose, Bld: 155 mg/dL — ABNORMAL HIGH (ref 70–99)
Potassium: 3.2 mEq/L — ABNORMAL LOW (ref 3.5–5.3)
Sodium: 140 mEq/L (ref 135–145)
Total Bilirubin: 0.3 mg/dL (ref 0.3–1.2)
Total Protein: 7.2 g/dL (ref 6.0–8.3)

## 2013-11-01 ENCOUNTER — Other Ambulatory Visit: Payer: BC Managed Care – PPO

## 2013-11-23 ENCOUNTER — Other Ambulatory Visit: Payer: BC Managed Care – PPO

## 2013-11-24 ENCOUNTER — Other Ambulatory Visit: Payer: BC Managed Care – PPO

## 2013-11-24 DIAGNOSIS — R7301 Impaired fasting glucose: Secondary | ICD-10-CM

## 2013-11-24 LAB — HEMOGLOBIN A1C
Hgb A1c MFr Bld: 6.3 % — ABNORMAL HIGH (ref <5.7)
Mean Plasma Glucose: 134 mg/dL — ABNORMAL HIGH (ref <117)

## 2013-12-26 ENCOUNTER — Telehealth: Payer: Self-pay | Admitting: Family Medicine

## 2013-12-26 MED ORDER — ALPRAZOLAM 0.25 MG PO TABS: ORAL_TABLET | ORAL | Status: DC

## 2013-12-26 NOTE — Telephone Encounter (Signed)
Pt called and stated she is going thru a very difficult relationship breakup. It is very difficult due to the fact that they are also  in business together. Pt is requesting that Xanax be called in for her. Pt was offered an appt but states due to the fact he isn't helping her with the business coming in would  be very difficult. Pt uses [facility] [address]

## 2013-12-26 NOTE — Telephone Encounter (Signed)
Called med to pharmacy

## 2013-12-26 NOTE — Telephone Encounter (Signed)
:  call in 30 of the 0.25 mg Xanax. 1 twice a day when necessary anxiety

## 2014-01-14 ENCOUNTER — Other Ambulatory Visit: Payer: Self-pay | Admitting: Family Medicine

## 2014-03-06 ENCOUNTER — Ambulatory Visit: Payer: BC Managed Care – PPO | Admitting: Family Medicine

## 2014-03-28 ENCOUNTER — Telehealth: Payer: Self-pay | Admitting: Family Medicine

## 2014-03-28 MED ORDER — VENLAFAXINE HCL ER 150 MG PO CP24: 150.0000 mg | ORAL_CAPSULE | Freq: Every day | ORAL | Status: DC

## 2014-03-28 NOTE — Telephone Encounter (Signed)
Call in a month supply of this

## 2014-03-28 NOTE — Telephone Encounter (Signed)
Medication called in to [place]

## 2014-03-28 NOTE — Telephone Encounter (Addendum)
Pt called and stated that she is on vacation and just realized she is out of effexor XR. Please call in to [facility] [address] Phone number is [phone]

## 2014-03-29 ENCOUNTER — Other Ambulatory Visit: Payer: BC Managed Care – PPO

## 2014-04-14 ENCOUNTER — Ambulatory Visit (INDEPENDENT_AMBULATORY_CARE_PROVIDER_SITE_OTHER): Payer: BC Managed Care – PPO | Admitting: Family Medicine

## 2014-04-14 ENCOUNTER — Encounter: Payer: Self-pay | Admitting: Family Medicine

## 2014-04-14 VITALS — Wt 247.0 lb

## 2014-04-14 DIAGNOSIS — S93409A Sprain of unspecified ligament of unspecified ankle, initial encounter: Secondary | ICD-10-CM

## 2014-04-14 DIAGNOSIS — S93402A Sprain of unspecified ligament of left ankle, initial encounter: Secondary | ICD-10-CM

## 2014-04-14 NOTE — Patient Instructions (Signed)
Use an elastic ankle sleeve regularly. Right the ABCs with your big toe several times per day. Wear anything that's comfortable and make sure you walk on a flat surface. Advil or Aleve for pain

## 2014-04-14 NOTE — Progress Notes (Signed)
   Subjective:    Patient ID: Cynthia [name], female    DOB: [birth year], 53 y.o.   MRN: [id number]  HPI 3 weeks ago she stepped on a hole in her yard and had an inversion injury sustaining some lateral ankle pain and swelling. She did slowly get better but in the last week she is noted more difficulty with swelling that sometimes wakes her up at night   Review of Systems     Objective:   Physical Exam Full motion of the left ankle. Slight tenderness to palpation over the ATF. No laxity noted. Good strength.       Assessment & Plan:  Left ankle sprain  recommend using an ankle sleeve as well as doing exercises to strengthen her ankle. Recommend walking on a flat surface and wearing comfortable shoes.

## 2014-05-16 ENCOUNTER — Telehealth: Payer: Self-pay | Admitting: Family Medicine

## 2014-05-16 MED ORDER — VENLAFAXINE HCL ER 150 MG PO CP24: 150.0000 mg | ORAL_CAPSULE | Freq: Every day | ORAL | Status: DC

## 2014-05-16 NOTE — Telephone Encounter (Signed)
Pt needs refill on effexor sent to [place].  Please send to [address] in [place]. Pt is completely out.

## 2014-05-17 ENCOUNTER — Telehealth: Payer: Self-pay | Admitting: Family Medicine

## 2014-05-17 NOTE — Telephone Encounter (Signed)
Talked pharmacist and she said she would change it

## 2014-05-17 NOTE — Telephone Encounter (Signed)
That's okay

## 2014-05-18 ENCOUNTER — Ambulatory Visit: Payer: BC Managed Care – PPO | Admitting: Family Medicine

## 2014-08-16 ENCOUNTER — Other Ambulatory Visit: Payer: Self-pay

## 2014-08-16 ENCOUNTER — Telehealth: Payer: Self-pay | Admitting: Family Medicine

## 2014-08-16 MED ORDER — VENLAFAXINE HCL ER 150 MG PO CP24: 150.0000 mg | ORAL_CAPSULE | Freq: Every day | ORAL | Status: DC

## 2014-08-16 NOTE — Telephone Encounter (Signed)
Pt made a medcheck appt for nov but needs effexor filled until then. Pt uses [facility] on [address] in [place]

## 2014-08-16 NOTE — Telephone Encounter (Signed)
DONE

## 2014-09-07 ENCOUNTER — Ambulatory Visit: Payer: BC Managed Care – PPO | Admitting: Family Medicine

## 2014-09-25 ENCOUNTER — Ambulatory Visit (INDEPENDENT_AMBULATORY_CARE_PROVIDER_SITE_OTHER): Payer: BC Managed Care – PPO | Admitting: Family Medicine

## 2014-09-25 ENCOUNTER — Encounter: Payer: Self-pay | Admitting: Family Medicine

## 2014-09-25 VITALS — BP 124/80 | HR 66 | Wt 232.0 lb

## 2014-09-25 DIAGNOSIS — G2581 Restless legs syndrome: Secondary | ICD-10-CM | POA: Insufficient documentation

## 2014-09-25 DIAGNOSIS — E669 Obesity, unspecified: Secondary | ICD-10-CM

## 2014-09-25 DIAGNOSIS — I10 Essential (primary) hypertension: Secondary | ICD-10-CM

## 2014-09-25 DIAGNOSIS — F17200 Nicotine dependence, unspecified, uncomplicated: Secondary | ICD-10-CM

## 2014-09-25 DIAGNOSIS — G43909 Migraine, unspecified, not intractable, without status migrainosus: Secondary | ICD-10-CM

## 2014-09-25 DIAGNOSIS — Z1231 Encounter for screening mammogram for malignant neoplasm of breast: Secondary | ICD-10-CM

## 2014-09-25 DIAGNOSIS — Z23 Encounter for immunization: Secondary | ICD-10-CM

## 2014-09-25 DIAGNOSIS — K2100 Gastro-esophageal reflux disease with esophagitis, without bleeding: Secondary | ICD-10-CM

## 2014-09-25 DIAGNOSIS — Z720 Tobacco use: Secondary | ICD-10-CM

## 2014-09-25 HISTORY — DX: Restless legs syndrome: G25.81

## 2014-09-25 MED ORDER — VENLAFAXINE HCL ER 150 MG PO CP24: 150.0000 mg | ORAL_CAPSULE | Freq: Every day | ORAL | Status: DC

## 2014-09-25 MED ORDER — ESOMEPRAZOLE MAGNESIUM 40 MG PO CPDR: DELAYED_RELEASE_CAPSULE | ORAL | Status: DC

## 2014-09-25 MED ORDER — ATENOLOL-CHLORTHALIDONE 50-25 MG PO TABS: 1.0000 | ORAL_TABLET | Freq: Every day | ORAL | Status: DC

## 2014-09-25 MED ORDER — PRAMIPEXOLE DIHYDROCHLORIDE 1 MG PO TABS: 1.0000 mg | ORAL_TABLET | Freq: Every day | ORAL | Status: DC

## 2014-09-25 NOTE — Progress Notes (Signed)
Subjective:     Patient ID: Cynthia [name], female   DOB: 1960-12-05, 53 y.o.   MRN: [id number]  HPI  This is a 53 year old female with a history of hypertension, obesity, and chronic migraine who presents for an extended medicine check.  Her major complaint at this time is poor sleep quality of 4-5 hr daily.  She has lost 15 pounds since her last visit (June) which she attributes to her physical activity at work and decreased appetite. Her work is quite stressful however she states that it has turned the corner and is now making enough to cover the expenses. Her reflux is under good control. She continues on Mirapex for her RLS.  Otherwise she is compliant with her medications.  Her migraines are minimal to nonexistent and her RLS is well controlled with pramipexole.    She will need a flu shot today and is due for a screening mammography.  Her last colonsocopy was in 2013.  Family history, social history, and allergies were reviewed and updated in the medical record. 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  She has no interest  in cessation at this time.  She drinks occasionally in social settings.  Review of Systems  Neuro: denies headaches, visual changes, vertigo, hearing changes CV: Denies palpitations, chest pain Pulm: Denies shortness of breath, PND Abd: Denies nausea, vomiting, constipation, diarrhea, hematochezia Extremities: Confirms swelling of the lower extremities.     Objective:   Physical Exam  Filed Vitals:   09/25/14 1016  BP: 124/80  Pulse: 66   General: Pleasant in NAD HEENT: Tympanic membranes, nasal membranes, and oropharynx normal.  No thyromegaly or lymphadenopathy.  No masses noted.  Fundoscopic exam unremarkable. CV: Regular rate and rhythm without murmurs, gallops, or rubs. Pulm: Clear to ascultation bilaterally with normal work of breathing Abd: Soft, non-tender with normoactive bowel sounds, no  organomegaly. MSK/Extremities: Swelling of the lower extremities with varicosities of the superficial saphenous apparent.  Skin thickening with some hyperpigmentation over the medial malleolus.  Assessment:     Cynthia [name] chronic conditions of HTN, migraine, GERD, and RLS are well controlled on her current medication regimen.  She should continue to try to lose weight with dietary modification and activity increase.  We agreed that a goal of 1 lb per week is an attainable and timebound goal which she is enthusiastic about pursuing.  Her physical exam findings of LE swelling and hyperpigmentation are concerning for venous stasis.  I encouraged her to explore compression stockings with her regular work on her feet.  Her health maintenance needs are addressed below.     Plan:    Essential hypertension - Plan: atenolol-chlorthalidone (TENORETIC) 50-25 MG per tablet, CBC with Differential, CMP and Liver  Migraine without status migrainosus, not intractable, unspecified migraine type - Plan: atenolol-chlorthalidone (TENORETIC) 50-25 MG per tablet, venlafaxine XR (EFFEXOR-XR) 150 MG 24 hr capsule  ESOPHAGITIS, REFLUX - Plan: esomeprazole (NEXIUM) 40 MG capsule  Current smoker  Obesity (BMI 30-39.9) - Plan: CBC with Differential, CMP and Liver, Lipid Panel  RLS (restless legs syndrome) - Plan: pramipexole (MIRAPEX) 1 MG tablet  Need for prophylactic vaccination and inoculation against influenza - Plan: Flu Vaccine QUAD 36+ mos IM  Visit for screening mammogram - Plan: MM Digital Diagnostic Bilat

## 2014-09-26 LAB — CBC WITH DIFFERENTIAL/PLATELET
Basophils Absolute: 0 10*3/uL (ref 0.0–0.1)
Basophils Relative: 0 % (ref 0–1)
Eosinophils Absolute: 0.1 10*3/uL (ref 0.0–0.7)
Eosinophils Relative: 2 % (ref 0–5)
HCT: 40.9 % (ref 36.0–46.0)
Hemoglobin: 13.9 g/dL (ref 12.0–15.0)
Lymphocytes Relative: 26 % (ref 12–46)
Lymphs Abs: 1.9 10*3/uL (ref 0.7–4.0)
MCH: 28.5 pg (ref 26.0–34.0)
MCHC: 34 g/dL (ref 30.0–36.0)
MCV: 83.8 fL (ref 78.0–100.0)
MPV: 10.8 fL (ref 9.4–12.4)
Monocytes Absolute: 0.4 10*3/uL (ref 0.1–1.0)
Monocytes Relative: 6 % (ref 3–12)
Neutro Abs: 4.8 10*3/uL (ref 1.7–7.7)
Neutrophils Relative %: 66 % (ref 43–77)
Platelets: 299 10*3/uL (ref 150–400)
RBC: 4.88 MIL/uL (ref 3.87–5.11)
RDW: 14.4 % (ref 11.5–15.5)
WBC: 7.3 10*3/uL (ref 4.0–10.5)

## 2014-09-26 LAB — CMP AND LIVER
ALT: 18 U/L (ref 0–35)
AST: 19 U/L (ref 0–37)
Albumin: 4 g/dL (ref 3.5–5.2)
Alkaline Phosphatase: 96 U/L (ref 39–117)
BUN: 11 mg/dL (ref 6–23)
Bilirubin, Direct: 0.1 mg/dL (ref 0.0–0.3)
CO2: 33 mEq/L — ABNORMAL HIGH (ref 19–32)
Calcium: 9.4 mg/dL (ref 8.4–10.5)
Chloride: 100 mEq/L (ref 96–112)
Creat: 0.77 mg/dL (ref 0.50–1.10)
Glucose, Bld: 98 mg/dL (ref 70–99)
Indirect Bilirubin: 0.4 mg/dL (ref 0.2–1.2)
Potassium: 3.2 mEq/L — ABNORMAL LOW (ref 3.5–5.3)
Sodium: 141 mEq/L (ref 135–145)
Total Bilirubin: 0.5 mg/dL (ref 0.2–1.2)
Total Protein: 7.1 g/dL (ref 6.0–8.3)

## 2014-09-26 LAB — LIPID PANEL
Cholesterol: 182 mg/dL (ref 0–200)
HDL: 48 mg/dL (ref >39)
LDL Cholesterol: 96 mg/dL (ref 0–99)
Total CHOL/HDL Ratio: 3.8 Ratio
Triglycerides: 190 mg/dL — ABNORMAL HIGH (ref <150)
VLDL: 38 mg/dL (ref 0–40)

## 2014-09-27 ENCOUNTER — Other Ambulatory Visit: Payer: Self-pay

## 2014-10-02 ENCOUNTER — Telehealth: Payer: Self-pay | Admitting: Family Medicine

## 2014-10-02 ENCOUNTER — Other Ambulatory Visit: Payer: Self-pay

## 2014-10-02 NOTE — Telephone Encounter (Signed)
done

## 2014-10-02 NOTE — Telephone Encounter (Signed)
Pt called and stated that at med check meds not filled. Pt is completely out of some. Please refill all meds to a DIFFERENT PHARMACY as she is off today. Please refill to [address]

## 2014-10-03 ENCOUNTER — Telehealth: Payer: Self-pay | Admitting: Internal Medicine

## 2014-10-03 ENCOUNTER — Telehealth: Payer: Self-pay | Admitting: Family Medicine

## 2014-10-03 DIAGNOSIS — G43909 Migraine, unspecified, not intractable, without status migrainosus: Secondary | ICD-10-CM

## 2014-10-03 MED ORDER — VENLAFAXINE HCL ER 150 MG PO CP24: ORAL_CAPSULE | ORAL | Status: DC

## 2014-10-03 NOTE — Telephone Encounter (Signed)
P.A. Isabelle Course, pt must use brand Nexium only.  Call Pharmacy & pt had already picked up

## 2014-10-03 NOTE — Telephone Encounter (Signed)
Med was called in to pharmacy. So I put it correct in the system

## 2014-10-06 ENCOUNTER — Ambulatory Visit (INDEPENDENT_AMBULATORY_CARE_PROVIDER_SITE_OTHER): Payer: BC Managed Care – PPO | Admitting: Medical

## 2014-10-06 ENCOUNTER — Other Ambulatory Visit: Payer: Self-pay | Admitting: Medical

## 2014-10-06 ENCOUNTER — Encounter: Payer: Self-pay | Admitting: Medical

## 2014-10-06 VITALS — BP 120/70 | HR 71 | Temp 98.1°F | Resp 15 | Wt 240.0 lb

## 2014-10-06 DIAGNOSIS — L989 Disorder of the skin and subcutaneous tissue, unspecified: Secondary | ICD-10-CM

## 2014-10-06 NOTE — Progress Notes (Signed)
Subjective: Here for biopsy of skin lesion of right forearm. She notes 2 weeks ago had a small freckle-like spot on her right forearm and over the course of 2 weeks it has rapidly grown to its current size with redness and scaling. She is worried about this being cancer. She denies self history of prior cancer of the skin but has had several biopsies before. her father has had numerous types of skin cancer. No other aggravating or relieving factors. No other complaint.  Review of systems as in subjective  Objective: Gen.: Well-developed, well-nourished, acute distress Skin: Proximal right forearm anteriorly with 9 mm x 6 mm somewhat oval raised red purple lesion with some crusting   Assessment: Encounter Diagnoses  Name Primary?  . Skin lesion of right arm Yes  . Changing skin lesion     Plan: She consents and requests this to be excised in entirety. Cleaned and prepped area in usual sterile fashion, used 1% lidocaine with epinephrine for local anesthesia, removed the lesion and margins with shave biopsy, estimated blood loss 2 mL, achieved hemostasis with silver nitrate and direct pressure. Covered with sterile bandage. Patient tolerated procedure well. Lesion sent for pathology discussed wound care. Follow-up pending pathology result  This note was dictated using [employer] voice recognition software.  Any spelling, word, or phrase errors above were unintentional.

## 2015-02-19 ENCOUNTER — Ambulatory Visit: Payer: Self-pay | Admitting: Family Medicine

## 2015-02-22 ENCOUNTER — Encounter: Payer: Self-pay | Admitting: Family Medicine

## 2015-02-22 ENCOUNTER — Ambulatory Visit (INDEPENDENT_AMBULATORY_CARE_PROVIDER_SITE_OTHER): Payer: BLUE CROSS/BLUE SHIELD | Admitting: Family Medicine

## 2015-02-22 VITALS — BP 122/76 | HR 67 | Temp 98.2°F | Wt 246.0 lb

## 2015-02-22 DIAGNOSIS — N3281 Overactive bladder: Secondary | ICD-10-CM

## 2015-02-22 DIAGNOSIS — M6740 Ganglion, unspecified site: Secondary | ICD-10-CM

## 2015-02-22 DIAGNOSIS — L309 Dermatitis, unspecified: Secondary | ICD-10-CM

## 2015-02-22 MED ORDER — MIRABEGRON ER 25 MG PO TB24
25.0000 mg | ORAL_TABLET | Freq: Every day | ORAL | Status: DC
Start: 2015-02-22 — End: 2015-07-13

## 2015-02-22 NOTE — Progress Notes (Signed)
   Subjective:    Patient ID: Cynthia [name], female    DOB: [birth year], 54 y.o.   MRN: [id number]  HPI She complains of a three-week history of a slightly erythematous rash on her abdomen as well as wrists. She also has a lesion on her right wrist near the radial head. She noticed this initially while bowling. She also has had a hysterectomy with a bladder tack but unfortunately is now having more difficulty with urinary leakage. She has been doing her [place] exercises without much benefit.   Review of Systems     Objective:   Physical Exam Alert and in no distress. Scattered erythematous lesions noted on her abdomen and small area on her upper back. Exam of the right wrist does show a 1-1/2 cm round smooth relatively immobile lesion near the radial head. He does get worse with extension of the wrist.       Assessment & Plan:  Ganglion cyst  OAB (overactive bladder) - Plan: mirabegron ER (MYRBETRIQ) 25 MG TB24 tablet  Dermatitis a sample of Myrbetriq given. She will let me know how this works. We might need to go to or higher dose. Explained that the ganglion cyst is nothing to worry about. Recommend cortisone cream for the rash.

## 2015-03-09 ENCOUNTER — Telehealth: Payer: Self-pay | Admitting: Family Medicine

## 2015-03-09 NOTE — Telephone Encounter (Signed)
Pt called and stated that the medication she was put on for incontinence is not working. She wanted to know if you had a sample of something stronger. She also states that the rash is not any better. Please advise pt can be reached at  [phone].

## 2015-03-09 NOTE — Telephone Encounter (Signed)
I will have her increase the dosing of the Myrbetriq to 50 mg and have her see dermatology.

## 2015-03-15 ENCOUNTER — Other Ambulatory Visit: Payer: Self-pay | Admitting: Family Medicine

## 2015-03-15 ENCOUNTER — Ambulatory Visit (INDEPENDENT_AMBULATORY_CARE_PROVIDER_SITE_OTHER): Payer: BLUE CROSS/BLUE SHIELD | Admitting: Family Medicine

## 2015-03-15 ENCOUNTER — Encounter: Payer: Self-pay | Admitting: Family Medicine

## 2015-03-15 VITALS — BP 124/80 | HR 64 | Wt 242.0 lb

## 2015-03-15 DIAGNOSIS — Z209 Contact with and (suspected) exposure to unspecified communicable disease: Secondary | ICD-10-CM

## 2015-03-15 DIAGNOSIS — Z1159 Encounter for screening for other viral diseases: Secondary | ICD-10-CM

## 2015-03-15 DIAGNOSIS — L309 Dermatitis, unspecified: Secondary | ICD-10-CM

## 2015-03-15 NOTE — Progress Notes (Signed)
   Subjective:    Patient ID: Cynthia [name], female    DOB: 12/03/1960, 54 y.o.   MRN: [id number]  HPI She is here for consultation concerning possible STD exposure. She just found out that she has potentially been exposed to HIV and hepatitis C. She has had at least one episode of unprotected sex. She was told that this gentleman's HIV viral load is undetectable. She also continues have difficulty with itching and dryness to several spots on her hands.  Review of Systems     Objective:   Physical Exam Alert and in no distress but tearful. Exam of the hands does show some drying round patches especially on the dorsal surface between the thumb and index finger.       Assessment & Plan:  Contact with or exposure to communicable disease - Plan: HIV antibody, GC/chlamydia probe amp, urine, RPR  Dermatitis - Plan: Ambulatory referral to Dermatology  Need for hepatitis C screening test - Plan: Hepatitis C Antibody Refer to dermatology for more definitive care although I did recommend using cortisone cream. Also discussed possible STD exposure.Discussed the possible risk of HIV with her and if he is truly viral load undetectable, risk of transmission is greatly reduced. Also discussed hepatitis C transmission and again sexual activity is very unlikely.

## 2015-03-16 ENCOUNTER — Telehealth: Payer: Self-pay | Admitting: Family Medicine

## 2015-03-16 LAB — HIV ANTIBODY (ROUTINE TESTING W REFLEX): HIV 1&2 Ab, 4th Generation: NONREACTIVE

## 2015-03-16 LAB — RPR

## 2015-03-16 LAB — GC/CHLAMYDIA PROBE AMP, URINE
Chlamydia, Swab/Urine, PCR: POSITIVE — AB
GC Probe Amp, Urine: NEGATIVE

## 2015-03-16 MED ORDER — AZITHROMYCIN 500 MG PO TABS: ORAL_TABLET | ORAL | Status: DC

## 2015-03-16 NOTE — Addendum Note (Signed)
Addended by: Denita Lung on: 03/16/2015 10:06 AM   Modules accepted: Orders

## 2015-03-16 NOTE — Telephone Encounter (Signed)
Pt would like to be notified by you or me or MyChart ONLY of her test results.  Thanks

## 2015-03-16 NOTE — Progress Notes (Signed)
   Subjective:    Patient ID: Cynthia [name], female    DOB: April 20, 1961, 54 y.o.   MRN: [id number]  HPI    Review of Systems     Objective:   Physical Exam        Assessment & Plan:  Patient informed of results

## 2015-03-20 LAB — HEPATITIS C ANTIBODY: HCV Ab: NEGATIVE

## 2015-05-18 ENCOUNTER — Encounter: Payer: Self-pay | Admitting: Medical

## 2015-05-18 ENCOUNTER — Ambulatory Visit (INDEPENDENT_AMBULATORY_CARE_PROVIDER_SITE_OTHER): Payer: Self-pay | Admitting: Medical

## 2015-05-18 VITALS — BP 102/80 | HR 78 | Temp 98.6°F | Resp 15 | Wt 233.0 lb

## 2015-05-18 DIAGNOSIS — R591 Generalized enlarged lymph nodes: Secondary | ICD-10-CM

## 2015-05-18 DIAGNOSIS — J020 Streptococcal pharyngitis: Secondary | ICD-10-CM

## 2015-05-18 DIAGNOSIS — R509 Fever, unspecified: Secondary | ICD-10-CM

## 2015-05-18 DIAGNOSIS — R599 Enlarged lymph nodes, unspecified: Secondary | ICD-10-CM

## 2015-05-18 LAB — POCT RAPID STREP A (OFFICE): Rapid Strep A Screen: POSITIVE — AB

## 2015-05-18 MED ORDER — PENICILLIN G BENZATHINE 1200000 UNIT/2ML IM SUSP
1.2000 10*6.[IU] | Freq: Once | INTRAMUSCULAR | Status: AC
  Administered 2015-05-18: 1.2 10*6.[IU] via INTRAMUSCULAR

## 2015-05-18 NOTE — Progress Notes (Signed)
Subjective:  Cynthia [name] is a 54 y.o. female who presents for evaluation of sore throat.  She has not had a recent close exposure to someone with proven streptococcal pharyngitis.  Associated symptoms include 2 day hx/o sore throat, swollen glands in neck, headache, some sinus pressure, ear pressure, minimal cough from sore throat, fever up to 102.5 yesterday, nausea.   No vomiting, no SOB or wheezing.  No rash.   Using Tylenol for pain.  no sick contacts.  No other aggravating or relieving factors.  No other c/o.  The following portions of the patient's history were reviewed and updated as appropriate: allergies, current medications, past medical history, past social history, past surgical history and problem list.  ROS as in subjective   Objective: Filed Vitals:   05/18/15 1404  BP: 102/80  Pulse: 78  Temp: 98.6 F (37 C)  Resp: 15    General appearance: no distress, WD/WN, ill-appearing HEENT: normocephalic, conjunctiva/corneas normal, sclerae anicteric, nares patent, no discharge or erythema, pharynx with erythema, no exudate.  Oral cavity: MMM, no lesions  Neck: supple, swollen anterior nodes 1cm roughly, no other swollen nodes, no thyromegaly Heart: RRR, normal S1, S2, no murmurs Lungs: CTA bilaterally, no wheezes, rhonchi, or rales   Laboratory Strep test done. Results:positive.    Assessment and Plan: Encounter Diagnoses  Name Primary?  . Streptococcal sore throat Yes  . Swollen lymph nodes   . Other specified fever     Advised that symptoms and exam suggest a bacterial/strep etiology. 1.2 million units BiCillin LA given IM.  Discussed symptomatic treatment including salt water gargles, warm fluids, rest, hydrate well, can use over-the-counter Ibuprofen for throat pain, fever, or malaise. If worse or not improving within 2-3 days, call or return.

## 2015-05-22 ENCOUNTER — Other Ambulatory Visit: Payer: Self-pay | Admitting: Medical

## 2015-05-22 ENCOUNTER — Other Ambulatory Visit: Payer: Self-pay | Admitting: Family Medicine

## 2015-05-22 ENCOUNTER — Telehealth: Payer: Self-pay | Admitting: Family Medicine

## 2015-05-22 MED ORDER — AZITHROMYCIN 250 MG PO TABS: ORAL_TABLET | ORAL | Status: DC

## 2015-05-22 NOTE — Telephone Encounter (Signed)
Patient is aware of the message from Dorothea Ogle PA and I sent the Rx to her pharmacy

## 2015-05-22 NOTE — Telephone Encounter (Signed)
Pt said she woke up this morning with a sore throat and you had told her some strains of strep are requiring 2 doses of antibiotics.  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

## 2015-05-22 NOTE — Telephone Encounter (Signed)
If still having symptoms, lets go with round of Zpak antibiotic.  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  Can use salt water gargles, warm fluids, ibuprofen or tylenol for pain

## 2015-07-13 ENCOUNTER — Encounter: Payer: Self-pay | Admitting: Family Medicine

## 2015-07-13 ENCOUNTER — Ambulatory Visit (INDEPENDENT_AMBULATORY_CARE_PROVIDER_SITE_OTHER): Payer: BLUE CROSS/BLUE SHIELD | Admitting: Family Medicine

## 2015-07-13 VITALS — BP 132/90 | HR 68 | Wt 235.4 lb

## 2015-07-13 DIAGNOSIS — B373 Candidiasis of vulva and vagina: Secondary | ICD-10-CM

## 2015-07-13 DIAGNOSIS — B3731 Acute candidiasis of vulva and vagina: Secondary | ICD-10-CM

## 2015-07-13 DIAGNOSIS — Z113 Encounter for screening for infections with a predominantly sexual mode of transmission: Secondary | ICD-10-CM

## 2015-07-13 LAB — POCT WET PREP (WET MOUNT)
Clue Cells Wet Prep Whiff POC: NEGATIVE
KOH Wet Prep POC: POSITIVE

## 2015-07-13 MED ORDER — FLUCONAZOLE 150 MG PO TABS: 150.0000 mg | ORAL_TABLET | Freq: Once | ORAL | Status: DC

## 2015-07-13 MED ORDER — AZITHROMYCIN 500 MG PO TABS: 1000.0000 mg | ORAL_TABLET | Freq: Every day | ORAL | Status: DC

## 2015-07-13 MED ORDER — CEFTRIAXONE SODIUM 1 G IJ SOLR
250.0000 mg | Freq: Once | INTRAMUSCULAR | Status: AC
  Administered 2015-07-13: 250 mg via INTRAMUSCULAR

## 2015-07-13 NOTE — Patient Instructions (Signed)
You can take 1 Diflucan tonight or in the morning. I gave you a refill and if you're having symptoms in 5 days you can take a second one.

## 2015-07-13 NOTE — Progress Notes (Signed)
   Subjective:    Patient ID: Cynthia [name], female    DOB: 01-05-1961, 54 y.o.   MRN: [id number]  HPI She states she got a message from a sexual partner that he tested positive for Chlamydia and 2 other STIs. She is here to be tested for STIs. Denies discharge, odor, irritation. Denies fever, chills, nausea, vomiting, rash, or urinary symptoms.   Review of Systems Pertinent positives and negatives in the history of present illness.    Objective:   Physical Exam  Constitutional: She appears well-developed and well-nourished. No distress.  Abdominal: Soft. Bowel sounds are normal. She exhibits no distension. There is no tenderness. There is no rebound.  Genitourinary: There is no rash or lesion on the right labia. There is no rash or lesion on the left labia. No erythema in the vagina. Vaginal discharge found.  Small amount of white thick discharge in vaginal vault          Assessment & Plan:  Screening for STD (sexually transmitted disease) - Plan: HIV antibody, GC/Chlamydia Probe Amp, RPR, POCT Wet Prep [facility] [place]), HSV(herpes simplex vrs) 1+2 ab-IgG, HSV(herpes simplex vrs) 1+2 ab-IgM, cefTRIAXone (ROCEPHIN) injection 250 mg  Vulvovaginal candidiasis  Since her sexual partner tested positive for Chlamydia it is reasonable to go ahead and treat her empirically. Wet prep shows some yeast so I will treat her with Diflucan. We will follow up once results are complete. Encouraged her to use condoms with future sexual encounters.

## 2015-07-14 LAB — GC/CHLAMYDIA PROBE AMP
CT Probe RNA: NEGATIVE
GC Probe RNA: NEGATIVE

## 2015-07-14 LAB — HIV ANTIBODY (ROUTINE TESTING W REFLEX): HIV 1&2 Ab, 4th Generation: NONREACTIVE

## 2015-07-14 LAB — RPR

## 2015-07-16 ENCOUNTER — Telehealth: Payer: Self-pay | Admitting: Family Medicine

## 2015-07-16 LAB — HSV(HERPES SIMPLEX VRS) I + II AB-IGM: Herpes Simplex Vrs I&II-IgM Ab (EIA): 3.02 INDEX — ABNORMAL HIGH

## 2015-07-16 LAB — HSV(HERPES SIMPLEX VRS) I + II AB-IGG
HSV 1 Glycoprotein G Ab, IgG: 0.29 IV
HSV 2 Glycoprotein G Ab, IgG: 7.82 IV — ABNORMAL HIGH

## 2015-07-18 NOTE — Telephone Encounter (Signed)
Discussed results with patient

## 2015-08-08 ENCOUNTER — Ambulatory Visit (INDEPENDENT_AMBULATORY_CARE_PROVIDER_SITE_OTHER): Payer: BLUE CROSS/BLUE SHIELD | Admitting: Family Medicine

## 2015-08-08 VITALS — BP 118/70 | HR 71 | Temp 98.0°F | Ht 64.0 in | Wt 241.0 lb

## 2015-08-08 DIAGNOSIS — Z720 Tobacco use: Secondary | ICD-10-CM

## 2015-08-08 DIAGNOSIS — F17200 Nicotine dependence, unspecified, uncomplicated: Secondary | ICD-10-CM

## 2015-08-08 DIAGNOSIS — J208 Acute bronchitis due to other specified organisms: Secondary | ICD-10-CM

## 2015-08-08 MED ORDER — ALBUTEROL SULFATE 108 (90 BASE) MCG/ACT IN AEPB: 2.0000 | INHALATION_SPRAY | Freq: Four times a day (QID) | RESPIRATORY_TRACT | Status: DC | PRN

## 2015-08-08 MED ORDER — AMOXICILLIN 875 MG PO TABS: 875.0000 mg | ORAL_TABLET | Freq: Two times a day (BID) | ORAL | Status: DC

## 2015-08-08 NOTE — Progress Notes (Signed)
   Subjective:    Patient ID: Cynthia [name], female    DOB: 02/24/1961, 54 y.o.   MRN: [id number]  HPI Approximately 2 weeks ago she developed rhinorrhea and a dry cough that has become productive and also having shortness of breath. No fever, chills, sore throat, earache, malaise or fatigue. She has started smoking again. She has quit twice in the past and does admit to starting again due to stress.   Review of Systems     Objective:   Physical Exam Alert and in no distress. Tympanic membranes and canals are normal. Pharyngeal area is normal. Neck is supple without adenopathy or thyromegaly. Cardiac exam shows a regular sinus rhythm without murmurs or gallops. Lungs slight expiratory wheezing        Assessment & Plan:  Current smoker  Acute bronchitis due to other specified organisms - Plan: Albuterol Sulfate (PROAIR RESPICLICK) 697 (90 BASE) MCG/ACT AEPB, amoxicillin (AMOXIL) 875 MG tablet  I will have her use Provera to help with the cough and slight shortness of breath. She will call if no improvement. Discussed smoking cessation with her. Discussed various stress reducing techniques. Instructed her to call when she is ready to quit and go back on Chantix.

## 2015-08-08 NOTE — Patient Instructions (Signed)
Come up with a new coping mechanism for stress and when you're ready to quit let me know

## 2015-08-21 ENCOUNTER — Ambulatory Visit (INDEPENDENT_AMBULATORY_CARE_PROVIDER_SITE_OTHER): Payer: BLUE CROSS/BLUE SHIELD | Admitting: Family Medicine

## 2015-08-21 ENCOUNTER — Encounter: Payer: Self-pay | Admitting: Family Medicine

## 2015-08-21 VITALS — BP 100/70 | HR 74 | Temp 97.9°F | Ht 64.0 in | Wt 242.0 lb

## 2015-08-21 DIAGNOSIS — F17200 Nicotine dependence, unspecified, uncomplicated: Secondary | ICD-10-CM

## 2015-08-21 DIAGNOSIS — J208 Acute bronchitis due to other specified organisms: Secondary | ICD-10-CM

## 2015-08-21 MED ORDER — AMOXICILLIN-POT CLAVULANATE 875-125 MG PO TABS: 1.0000 | ORAL_TABLET | Freq: Two times a day (BID) | ORAL | Status: DC

## 2015-08-21 NOTE — Progress Notes (Signed)
   Subjective:    Patient ID: Cynthia [name], female    DOB: 05-May-1961, 54 y.o.   MRN: [id number]  HPI She is here for a recheck. She was seen recently and treated for bronchitis with amoxicillin. She continues had difficulty with coughing but also with sinus congestion and drainage work. No fever, chills or sore throat.   Review of Systems     Objective:   Physical Exam Alert and in no distress. Tympanic membranes and canals are normal. Pharyngeal area is normal. Neck is supple without adenopathy or thyromegaly. Cardiac exam shows a regular sinus rhythm without murmurs or gallops. Lungs are clear to auscultation.       Assessment & Plan:  Current smoker  Acute bronchitis due to other specified organisms - Plan: amoxicillin-clavulanate (AUGMENTIN) 875-125 MG tablet  Encouraged her to work further on smoking cessation and mentioned getting involved and quit smoking groups. She will call if no improvement in her breathing and coughing.

## 2015-09-26 ENCOUNTER — Encounter: Payer: BLUE CROSS/BLUE SHIELD | Admitting: Family Medicine

## 2015-10-01 ENCOUNTER — Ambulatory Visit (INDEPENDENT_AMBULATORY_CARE_PROVIDER_SITE_OTHER): Payer: BLUE CROSS/BLUE SHIELD | Admitting: Family Medicine

## 2015-10-01 ENCOUNTER — Encounter: Payer: Self-pay | Admitting: Family Medicine

## 2015-10-01 VITALS — BP 110/70 | HR 68 | Ht 64.0 in | Wt 245.0 lb

## 2015-10-01 DIAGNOSIS — Z1159 Encounter for screening for other viral diseases: Secondary | ICD-10-CM

## 2015-10-01 DIAGNOSIS — K2100 Gastro-esophageal reflux disease with esophagitis, without bleeding: Secondary | ICD-10-CM

## 2015-10-01 DIAGNOSIS — I10 Essential (primary) hypertension: Secondary | ICD-10-CM

## 2015-10-01 DIAGNOSIS — Z23 Encounter for immunization: Secondary | ICD-10-CM

## 2015-10-01 DIAGNOSIS — G2581 Restless legs syndrome: Secondary | ICD-10-CM

## 2015-10-01 DIAGNOSIS — G43909 Migraine, unspecified, not intractable, without status migrainosus: Secondary | ICD-10-CM

## 2015-10-01 DIAGNOSIS — Z720 Tobacco use: Secondary | ICD-10-CM

## 2015-10-01 DIAGNOSIS — Z1239 Encounter for other screening for malignant neoplasm of breast: Secondary | ICD-10-CM

## 2015-10-01 DIAGNOSIS — Z0000 Encounter for general adult medical examination without abnormal findings: Secondary | ICD-10-CM

## 2015-10-01 DIAGNOSIS — F17200 Nicotine dependence, unspecified, uncomplicated: Secondary | ICD-10-CM

## 2015-10-01 DIAGNOSIS — L309 Dermatitis, unspecified: Secondary | ICD-10-CM

## 2015-10-01 DIAGNOSIS — E669 Obesity, unspecified: Secondary | ICD-10-CM

## 2015-10-01 LAB — LIPID PANEL
Cholesterol: 168 mg/dL (ref 125–200)
HDL: 49 mg/dL (ref >=46)
LDL Cholesterol: 84 mg/dL (ref <130)
Total CHOL/HDL Ratio: 3.4 Ratio (ref <=5.0)
Triglycerides: 176 mg/dL — ABNORMAL HIGH (ref <150)
VLDL: 35 mg/dL — ABNORMAL HIGH (ref <30)

## 2015-10-01 LAB — CBC WITH DIFFERENTIAL/PLATELET
Basophils Absolute: 0 10*3/uL (ref 0.0–0.1)
Basophils Relative: 0 % (ref 0–1)
Eosinophils Absolute: 0.2 10*3/uL (ref 0.0–0.7)
Eosinophils Relative: 3 % (ref 0–5)
HCT: 39.9 % (ref 36.0–46.0)
Hemoglobin: 13.6 g/dL (ref 12.0–15.0)
Lymphocytes Relative: 30 % (ref 12–46)
Lymphs Abs: 2.3 10*3/uL (ref 0.7–4.0)
MCH: 28.9 pg (ref 26.0–34.0)
MCHC: 34.1 g/dL (ref 30.0–36.0)
MCV: 84.7 fL (ref 78.0–100.0)
MPV: 10.8 fL (ref 8.6–12.4)
Monocytes Absolute: 0.5 10*3/uL (ref 0.1–1.0)
Monocytes Relative: 6 % (ref 3–12)
Neutro Abs: 4.7 10*3/uL (ref 1.7–7.7)
Neutrophils Relative %: 61 % (ref 43–77)
Platelets: 273 10*3/uL (ref 150–400)
RBC: 4.71 MIL/uL (ref 3.87–5.11)
RDW: 13.7 % (ref 11.5–15.5)
WBC: 7.7 10*3/uL (ref 4.0–10.5)

## 2015-10-01 LAB — COMPREHENSIVE METABOLIC PANEL
ALT: 19 U/L (ref 6–29)
AST: 21 U/L (ref 10–35)
Albumin: 3.9 g/dL (ref 3.6–5.1)
Alkaline Phosphatase: 95 U/L (ref 33–130)
BUN: 17 mg/dL (ref 7–25)
CO2: 32 mmol/L — ABNORMAL HIGH (ref 20–31)
Calcium: 9.4 mg/dL (ref 8.6–10.4)
Chloride: 101 mmol/L (ref 98–110)
Creat: 0.86 mg/dL (ref 0.50–1.05)
Glucose, Bld: 95 mg/dL (ref 65–99)
Potassium: 3.4 mmol/L — ABNORMAL LOW (ref 3.5–5.3)
Sodium: 140 mmol/L (ref 135–146)
Total Bilirubin: 0.3 mg/dL (ref 0.2–1.2)
Total Protein: 7.1 g/dL (ref 6.1–8.1)

## 2015-10-01 MED ORDER — VENLAFAXINE HCL ER 150 MG PO CP24: ORAL_CAPSULE | ORAL | Status: DC

## 2015-10-01 MED ORDER — PRAMIPEXOLE DIHYDROCHLORIDE 1 MG PO TABS: 1.0000 mg | ORAL_TABLET | Freq: Every day | ORAL | Status: DC

## 2015-10-01 MED ORDER — ESOMEPRAZOLE MAGNESIUM 40 MG PO CPDR: DELAYED_RELEASE_CAPSULE | ORAL | Status: DC

## 2015-10-01 NOTE — Progress Notes (Signed)
Subjective:    Patient ID: Cynthia [name], female    DOB: 1961-01-27, 54 y.o.   MRN: [id number]  HPI She is here for a complete examination. She does have a rash present on her medial forearms as well as abdomen. It is pleuritic in nature. She does not remember being exposed to anything in particular. She's not change any of her medications. No fever or chills. 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 She is potentially interested in Chantix at a later date. She does have RLS and is doing fairly well on her present medication. She does have reflux disease and has had more difficulty with chest discomfort and now is taking Nexium twice per day. There was a question of IBS in the past however she says the pain is really more reflux related. Psychologically sees doing fairly well on the present circumstances and continues on Effexor. She has not had any trouble with migraine headaches recently. She continues on atenolol/chlorthalidone for her hypertension. Family and social history as well as health maintenance and immunizations were reviewed. She has no other concerns or complaints specifically chest pain, shortness breath, GI issues   Review of Systems  All other systems reviewed and are negative.      Objective:   Physical Exam BP 110/70 mmHg  Pulse 68  Ht 5' 4"  (1.626 m)  Wt 245 lb (111.131 kg)  BMI 42.03 kg/m2  LMP 06/07/2011  General Appearance:    Alert, cooperative, no distress, appears stated age  Head:    Normocephalic, without obvious abnormality, atraumatic  Eyes:    PERRL, conjunctiva/corneas clear, EOM's intact, fundi    benign  Ears:    Normal TM's and external ear canals  Nose:   Nares normal, mucosa normal, no drainage or sinus   tenderness  Throat:   Lips, mucosa, and tongue normal; teeth and gums normal  Neck:   Supple, no lymphadenopathy;  thyroid:  no   enlargement/tenderness/nodules; no carotid   bruit or JVD    Back:    Spine nontender, no curvature, ROM normal, no CVA     tenderness  Lungs:     Clear to auscultation bilaterally without wheezes, rales or     ronchi; respirations unlabored  Chest Wall:    No tenderness or deformity   Heart:    Regular rate and rhythm, S1 and S2 normal, no murmur, rub   or gallop  Breast Exam:    No tenderness, masses, or nipple discharge or inversion.      No axillary lymphadenopathy  Abdomen:     Soft, non-tender, nondistended, normoactive bowel sounds,    no masses, no hepatosplenomegaly  Genitalia:    Normal external genitalia without lesions.  BUS and vagina normal; cervix without lesions, or cervical motion tenderness. No abnormal vaginal discharge.  Uterus and adnexa not enlarged, nontender, no masses.    Rectal:  deffered  Extremities:   No clubbing, cyanosis or edema  Pulses:   2+ and symmetric all extremities  Skin:   Skin color, texture, turgor normal, no rashes or lesions  Lymph nodes:   Cervical, supraclavicular, and axillary nodes normal  Neurologic:   CNII-XII intact, normal strength, sensation and gait; reflexes 2+ and symmetric throughout          Psych:   Normal mood, affect, hygiene and grooming.         Assessment & Plan:  Routine general medical  examination at a health care facility - Plan: CBC with Differential/Platelet, Comprehensive metabolic panel, Lipid panel  Dermatitis  ESOPHAGITIS, REFLUX  Essential hypertension - Plan: CBC with Differential/Platelet, Comprehensive metabolic panel  Current smoker  RLS (restless legs syndrome)  Obesity (BMI 30-39.9) - Plan: CBC with Differential/Platelet, Comprehensive metabolic panel, Lipid panel  Migraine without status migrainosus, not intractable, unspecified migraine type  Need for prophylactic vaccination and inoculation against influenza - Plan: Flu Vaccine QUAD 36+ mos IM  Need for hepatitis C screening test - Plan: Hepatitis C antibody  Screening for breast cancer - Plan: MM  DIGITAL SCREENING BILATERAL   She will continue on her present medications. discussed her smoking. Recommend she come back when she is ready to quit. Discussed stress management with her and recommended some reading materials.

## 2015-10-01 NOTE — Patient Instructions (Addendum)
Try the over-the-counter cortisone cream first and Benadryl at night and if no improvement let me know Go get a book on stress management Use the serenity prayer There is a difference between worry and concern The past and prepare for the future but live in the present Give yourself permission to enjoy the present moment. 20 minutes every day of something physical

## 2015-10-02 LAB — HEPATITIS C ANTIBODY: HCV Ab: NEGATIVE

## 2015-10-15 ENCOUNTER — Telehealth: Payer: Self-pay | Admitting: Family Medicine

## 2015-10-15 DIAGNOSIS — I10 Essential (primary) hypertension: Secondary | ICD-10-CM

## 2015-10-15 DIAGNOSIS — G43909 Migraine, unspecified, not intractable, without status migrainosus: Secondary | ICD-10-CM

## 2015-10-15 MED ORDER — ATENOLOL-CHLORTHALIDONE 50-25 MG PO TABS: 1.0000 | ORAL_TABLET | Freq: Every day | ORAL | Status: DC

## 2015-10-15 NOTE — Telephone Encounter (Signed)
Sent med to pharmacy

## 2015-10-15 NOTE — Telephone Encounter (Signed)
Pt called and stated she needs refills of bp med. Please [facility] to [facility] [address]

## 2015-11-14 ENCOUNTER — Emergency Department (HOSPITAL_COMMUNITY)
Admission: EM | Admit: 2015-11-14 | Discharge: 2015-11-14 | Disposition: A | Discharge status: Home / Self Care | Payer: Self-pay | Attending: Emergency Medicine | Admitting: Emergency Medicine

## 2015-11-14 ENCOUNTER — Emergency Department (HOSPITAL_COMMUNITY): Payer: Self-pay

## 2015-11-14 ENCOUNTER — Encounter (HOSPITAL_COMMUNITY): Payer: Self-pay | Admitting: *Deleted

## 2015-11-14 DIAGNOSIS — Z79899 Other long term (current) drug therapy: Secondary | ICD-10-CM | POA: Insufficient documentation

## 2015-11-14 DIAGNOSIS — F329 Major depressive disorder, single episode, unspecified: Secondary | ICD-10-CM | POA: Insufficient documentation

## 2015-11-14 DIAGNOSIS — G2581 Restless legs syndrome: Secondary | ICD-10-CM | POA: Insufficient documentation

## 2015-11-14 DIAGNOSIS — A599 Trichomoniasis, unspecified: Secondary | ICD-10-CM

## 2015-11-14 DIAGNOSIS — R319 Hematuria, unspecified: Secondary | ICD-10-CM

## 2015-11-14 DIAGNOSIS — N390 Urinary tract infection, site not specified: Secondary | ICD-10-CM | POA: Insufficient documentation

## 2015-11-14 DIAGNOSIS — F1410 Cocaine abuse, uncomplicated: Secondary | ICD-10-CM | POA: Insufficient documentation

## 2015-11-14 DIAGNOSIS — G43909 Migraine, unspecified, not intractable, without status migrainosus: Secondary | ICD-10-CM | POA: Insufficient documentation

## 2015-11-14 DIAGNOSIS — K219 Gastro-esophageal reflux disease without esophagitis: Secondary | ICD-10-CM | POA: Insufficient documentation

## 2015-11-14 DIAGNOSIS — E876 Hypokalemia: Secondary | ICD-10-CM | POA: Insufficient documentation

## 2015-11-14 DIAGNOSIS — I10 Essential (primary) hypertension: Secondary | ICD-10-CM | POA: Insufficient documentation

## 2015-11-14 DIAGNOSIS — F17210 Nicotine dependence, cigarettes, uncomplicated: Secondary | ICD-10-CM | POA: Insufficient documentation

## 2015-11-14 DIAGNOSIS — F419 Anxiety disorder, unspecified: Secondary | ICD-10-CM | POA: Insufficient documentation

## 2015-11-14 DIAGNOSIS — G8929 Other chronic pain: Secondary | ICD-10-CM | POA: Insufficient documentation

## 2015-11-14 DIAGNOSIS — A5901 Trichomonal vulvovaginitis: Secondary | ICD-10-CM | POA: Insufficient documentation

## 2015-11-14 DIAGNOSIS — Z862 Personal history of diseases of the blood and blood-forming organs and certain disorders involving the immune mechanism: Secondary | ICD-10-CM | POA: Insufficient documentation

## 2015-11-14 DIAGNOSIS — R011 Cardiac murmur, unspecified: Secondary | ICD-10-CM | POA: Insufficient documentation

## 2015-11-14 DIAGNOSIS — R55 Syncope and collapse: Secondary | ICD-10-CM | POA: Insufficient documentation

## 2015-11-14 LAB — RAPID URINE DRUG SCREEN, HOSP PERFORMED
Amphetamines: NOT DETECTED
Barbiturates: NOT DETECTED
Benzodiazepines: NOT DETECTED
Cocaine: POSITIVE — AB
Opiates: NOT DETECTED
Tetrahydrocannabinol: NOT DETECTED

## 2015-11-14 LAB — I-STAT TROPONIN, ED: Troponin i, poc: 0.01 ng/mL (ref 0.00–0.08)

## 2015-11-14 LAB — BASIC METABOLIC PANEL
Anion gap: 8 (ref 5–15)
BUN: 9 mg/dL (ref 6–20)
CO2: 28 mmol/L (ref 22–32)
Calcium: 8.7 mg/dL — ABNORMAL LOW (ref 8.9–10.3)
Chloride: 104 mmol/L (ref 101–111)
Creatinine, Ser: 1 mg/dL (ref 0.44–1.00)
GFR calc Af Amer: >60 mL/min (ref >60)
GFR calc non Af Amer: >60 mL/min (ref >60)
Glucose, Bld: 157 mg/dL — ABNORMAL HIGH (ref 65–99)
Potassium: 3.2 mmol/L — ABNORMAL LOW (ref 3.5–5.1)
Sodium: 140 mmol/L (ref 135–145)

## 2015-11-14 LAB — SALICYLATE LEVEL: Salicylate Lvl: <4 mg/dL (ref 2.8–30.0)

## 2015-11-14 LAB — URINALYSIS, ROUTINE W REFLEX MICROSCOPIC
Bilirubin Urine: NEGATIVE
Glucose, UA: NEGATIVE mg/dL
Ketones, ur: NEGATIVE mg/dL
Nitrite: NEGATIVE
Protein, ur: NEGATIVE mg/dL
Specific Gravity, Urine: 1.017 (ref 1.005–1.030)
pH: 6.5 (ref 5.0–8.0)

## 2015-11-14 LAB — CBC WITH DIFFERENTIAL/PLATELET
Basophils Absolute: 0 10*3/uL (ref 0.0–0.1)
Basophils Relative: 0 %
Eosinophils Absolute: 0.6 10*3/uL (ref 0.0–0.7)
Eosinophils Relative: 6 %
HCT: 41.4 % (ref 36.0–46.0)
Hemoglobin: 13.1 g/dL (ref 12.0–15.0)
Lymphocytes Relative: 17 %
Lymphs Abs: 1.5 10*3/uL (ref 0.7–4.0)
MCH: 28.2 pg (ref 26.0–34.0)
MCHC: 31.6 g/dL (ref 30.0–36.0)
MCV: 89 fL (ref 78.0–100.0)
Monocytes Absolute: 0.3 10*3/uL (ref 0.1–1.0)
Monocytes Relative: 3 %
Neutro Abs: 6.6 10*3/uL (ref 1.7–7.7)
Neutrophils Relative %: 74 %
Platelets: 214 10*3/uL (ref 150–400)
RBC: 4.65 MIL/uL (ref 3.87–5.11)
RDW: 14.2 % (ref 11.5–15.5)
WBC: 8.9 10*3/uL (ref 4.0–10.5)

## 2015-11-14 LAB — URINE MICROSCOPIC-ADD ON

## 2015-11-14 LAB — ETHANOL: Alcohol, Ethyl (B): <5 mg/dL (ref <5)

## 2015-11-14 LAB — CBG MONITORING, ED: Glucose-Capillary: 100 mg/dL — ABNORMAL HIGH (ref 65–99)

## 2015-11-14 LAB — ACETAMINOPHEN LEVEL: Acetaminophen (Tylenol), Serum: <10 ug/mL — ABNORMAL LOW (ref 10–30)

## 2015-11-14 MED ORDER — METRONIDAZOLE 500 MG PO TABS
2000.0000 mg | ORAL_TABLET | Freq: Once | ORAL | Status: AC
  Administered 2015-11-14: 2000 mg via ORAL
  Filled 2015-11-14: qty 4

## 2015-11-14 MED ORDER — CEPHALEXIN 500 MG PO CAPS: 500.0000 mg | ORAL_CAPSULE | Freq: Three times a day (TID) | ORAL | Status: DC

## 2015-11-14 MED ORDER — SODIUM CHLORIDE 0.9 % IV BOLUS (SEPSIS)
1000.0000 mL | Freq: Once | INTRAVENOUS | Status: AC
  Administered 2015-11-14: 1000 mL via INTRAVENOUS

## 2015-11-14 MED ORDER — POTASSIUM CHLORIDE ER 20 MEQ PO TBCR: 40.0000 meq | EXTENDED_RELEASE_TABLET | Freq: Every day | ORAL | Status: DC

## 2015-11-14 NOTE — ED Notes (Signed)
Patient transported to X-ray 

## 2015-11-14 NOTE — ED Notes (Signed)
Pt arrives from beauty shop rt witnessed syncopal episode that was 5sec in duration. Pt states she was getting her hair down then became hot and passed out. Pt endorses she did crack cocaine last night for the first time and didn't feel "right" all day.

## 2015-11-14 NOTE — ED Notes (Signed)
CBG 100 

## 2015-11-14 NOTE — ED Notes (Signed)
Pt is in stable condition upon d/c and ambulates from ED. 

## 2015-11-14 NOTE — ED Provider Notes (Signed)
  Face-to-face evaluation   History: Syncope, while having hair dried. She was assisted to floor, and did not sustain any injuries. She feels that she might be dehydrated today. No other preceding symptoms.  Physical exam: Alert, calm, cooperative. She is lucid. No respiratory distress. Moves all extremities equally.  Medical screening examination/treatment/procedure(s) were conducted as a shared visit with non-physician practitioner(s) and myself.  I personally evaluated the patient during the encounter  Daleen Bo, MD 11/15/15 [phone]

## 2015-11-14 NOTE — Discharge Instructions (Signed)
You were seen in the emergency room today for evaluation following a syncopal episode. Your bloodwork, EKG, and chest x-ray were unremarkable and your syncopal episode was most likely due to heat, dehydration, and not eating/drinking much lately. Your blood work did show evidence of slightly low potassium levels but this appears chronic for you. I will give you a prescription for potassium supplements for the next few days but please follow up with your primary care provider within one week for re-check. Your urine today also showed signs of infection as we discussed. Please notify all sexual partners that you were treated today and they should be treated as well. Please talk to your primary care provider for test of cure when you see them for follow up as well. In the meantime, I am giving you a prescription for antibiotics to cover for other bacterial causes of possible UTI. I will send your urine for culture.   Return to the emergency room for new or worsening symptoms.    Emergency Department Resource Guide 1) Find a Doctor and Pay Out of Pocket Although you won't have to find out who is covered by your insurance plan, it is a good idea to ask around and get recommendations. You will then need to call the office and see if the doctor you have chosen will accept you as a new patient and what types of options they offer for patients who are self-pay. Some doctors offer discounts or will set up payment plans for their patients who do not have insurance, but you will need to ask so you aren't surprised when you get to your appointment.  2) Contact Your Local Health Department Not all health departments have doctors that can see patients for sick visits, but many do, so it is worth a call to see if yours does. If you don't know where your local health department is, you can check in your phone book. The CDC also has a tool to help you locate your state's health department, and many state websites also have  listings of all of their local health departments.  3) Find a [facility] If your illness is not likely to be very severe or complicated, you may want to try a walk in clinic. These are popping up all over the country in pharmacies, drugstores, and shopping centers. They're usually staffed by nurse practitioners or physician assistants that have been trained to treat common illnesses and complaints. They're usually fairly quick and inexpensive. However, if you have serious medical issues or chronic medical problems, these are probably not your best option.  No Primary Care Doctor: - Call Health Connect at  [phone] - they can help you locate a primary care doctor that  accepts your insurance, provides certain services, etc. - Physician Referral Service- [phone]  Chronic Pain Problems: Organization         Address  Phone   Notes  [facility]  [phone] Patients need to be referred by their primary care doctor.   Medication Assistance: Organization         Address  Phone   Notes  [facility] Medication [facility] [address] [place], [place] [address] [phone] --Must be a resident of [facility] -- Must have NO insurance coverage whatsoever (no Medicaid/ Medicare, etc.) -- The pt. MUST have a primary care doctor that directs their care regularly and follows them in the community   [facility]ist  [phone]  [employer]  [phone]    Agencies that provide inexpensive medical care: Organization         Address  Phone   Notes  [place]  [phone]   [facility]    [phone]   [facility] [place], [place] [address] [phone]   [place] [address] [place] [phone]   Planned Parenthood    [phone]   [facility]    [phone]   [place] and [place]  [address], [place] Phone:  [phone], Fax:  [phone] Hours of Operation:  9 am - 6 pm, M-F.  Also accepts Medicaid/Medicare and self-pay.  [facility] for [address], [address], [place] Phone: [phone], Fax: [phone]. Hours of Operation:  8:30 am - 5:30 pm, M-F.  Also accepts Medicaid and self-pay.  [facility] [place] [address], [place] Phone: [phone]   [place], [place], [place] [phone], Ext. [phone] Mondays & Thursdays: 7-9 AM.  First 15 patients are seen on a first come, first serve basis.    [place] Providers:  Organization         Address  Phone   Notes  [facility] [address], [place] [phone] Also accepts self-pay patients.  [facility], [place] [address], [place]  [phone]   [place], [address], [place] [phone]   [facility] [address] [place] [phone]   Lucianne Lei [address], [place]   [phone] Only accepts [place] Access [place] patients after they have their name applied to their card.   Self-Pay (no insurance) in [facility]:  Organization         Address  Phone   Notes  Sickle Cell Patients, [facility] [place] [phone]   [facility] [place] [phone]   [facility] [place]  [place], [place], [place] [phone]   [facility]/Dr. Osei-Bonsu  [address] [place] or [address], [place] [phone] Phone number for both [place] and [place] locations is the same.  Urgent Medical and [facility] [address] [place] [phone]   [facility] [address],  [place] or [address] [phone]   [facility] [address], [place] [phone], phone; [phone], fax Sees patients 1st and 3rd Saturday of every month.  Must not qualify for public or private insurance (i.e. Medicaid, Medicare, [place] Health Choice, Veterans' Benefits)  Household income should be no more than 200% of the poverty level The clinic cannot treat you if you are pregnant or think you are pregnant  Sexually transmitted diseases are not treated at the clinic.    Dental Care: Organization         Address  Phone  Notes  [facility] Department of [facility] [phone] Accepts children up to age 46 who are enrolled in [place] or [place]; pregnant women with a Medicaid card;  and children who have applied for Medicaid or [place] Health Choice, but were declined, whose parents can pay a reduced fee at time of service.  [facility] Department of [facility]  [address], [place] [phone] Accepts children up to age 52 who are enrolled in [place] or [place]; pregnant women with a Medicaid card; and children who have applied for Medicaid or [place] Health Choice, but were declined, whose parents can pay a reduced fee at time of service.  [place] Adult Dental Access PROGRAM  [place] [phone] Patients are seen by appointment only. Walk-ins are not accepted. [place] will see patients 92 years of age and older. Monday - Tuesday (8am-5pm) Most Wednesdays (8:30-5pm) $30 per visit, cash only  [facility] Adult Dental Access PROGRAM  [address], [facility] [phone] Patients are seen by appointment only. Walk-ins are not accepted. [place] will see patients 41 years of age and older. One Wednesday Evening (Monthly: Volunteer Based).  $30 per visit, cash only  [place]  [phone] for adults; Children under age 61, call Graduate Pediatric Dentistry at [phone]. Children aged 40-14, please call [phone] to request a pediatric application.  Dental services are provided in all areas of dental care including fillings, crowns and bridges, complete and partial dentures, implants, gum treatment, root canals, and extractions. Preventive care is also provided. Treatment is provided to both adults and children. Patients are selected via a lottery and there is often a waiting list.   [facility] [address], [place]  [phone] [email or web address]   [facility] [address] [place], [place] [phone], Ext. [phone] Second and Fourth Thursday of each month, opens at 6:30 AM; Clinic ends at 9 AM.  Patients are seen on a first-come first-served basis, and a limited number are seen during each clinic.   [facility]  [address] Hillard Danker [place], [place] [phone]   Eligibility Requirements You must have lived in [place], [place], or [place] counties for at least the last three months.   You cannot be eligible for state or federal sponsored [employer], including [employer], [place], or [employer].   You generally cannot be eligible for healthcare insurance through your employer.    How to apply: Eligibility screenings are held every Tuesday and Wednesday afternoon from 1:00 pm until 4:00 pm. You do not need an appointment for the interview!  [facility] [address], [place], [place]   [place]  [place] Department  [place]  [phone]    Behavioral Health Resources in the Community: Intensive Outpatient Programs Organization         Address  Phone  Notes  [place]. [address], [place], [place] [phone]     [facility] Outpatient [address] [place], [place]   ADS: Alcohol & Drug Svcs [address] [place], [place]   [place] [address],  [place], [place] or [phone]   Substance Abuse Resources Organization         Address  Phone  Notes  Alcohol and Drug Services  [place]  [phone]   [facility] [place]   [facility]  [phone]  Residential & Outpatient Substance Abuse Program  [phone]   Psychological Services Organization         Address  Phone  Notes  [facility] [place]  [place]  [phone]   [place] [phone] [place] or [phone]    Mobile Crisis Teams Organization         Address  Phone  Notes  [facility]rapeutic Alternatives, Mobile Crisis Care Unit  [phone]   Assertive Psychotherapeutic Services  [address] [place], [place]   [place] Levels [address], [place] [facility] [phone]    Self-Help/Support Groups Organization         Address  Phone             Notes  [place]. of [place] - variety of support groups  [place] Call for more information  Narcotics Anonymous (NA), [facility] [address], [employer] [place]  2 meetings at this location   Special educational needs teacher         Address  Phone  Notes  [facility] Residential Treatment [place],    [place]  [phone]   [facility]  [address] [place] [id number], [place], [place]   [place], [place] [phone] Admissions: 8am-3pm M-F  Incentives Substance [place] [address],    [place], [place] [phone]   [facility] [address] [place], [place], [place]   [facility] [address],  [place],  [place]   Insight Programs - Intensive Outpatient [address], [place], [place]   [facility] [place].,  [place], [place] [phone] or [phone]   [facility] (RTS) [address], [place], [place] Accepts Medicaid  [facility] [address],  [place] [phone] Substance Abuse/Addiction Treatment   [facility] Organization         Address  Phone  Notes  [facility]  [phone]   Domenic Schwab, PhD [address] Arlis Porta [place], [place]   [phone] or [phone]   [place], [place] [phone]   [facility] [address] [place], [place] [phone] Insurance/Medicaid/sponsorship through [facility] and Families [address] [place]                                    [place], [place] [phone] [place] [address], [place] [phone]    Dr. Adele Schilder  [phone]   [facility] [place] Dept. 1) [address], [place] 2) [place] 3)  [place] [address], [place] [phone]  [phone]   [place] [phone] or [phone] (After Hours)

## 2015-11-14 NOTE — ED Notes (Signed)
PA at bedside.

## 2015-11-14 NOTE — ED Provider Notes (Signed)
CSN: [id number]     Arrival date & time 11/14/15  1544 History   First MD Initiated Contact with Patient 11/14/15 1546     Chief Complaint  Patient presents with  . Loss of Consciousness    HPI   Cynthia [name] is an 55 y.o. female with history of HTN, GERD, depression, MVP, anemia, chronic headaches who presents to the ED for evaluation following a syncopal episode. Pt states she was at the salon sitting in the chair having her highlights done when she started feeling hot and clammy. She states that she apparently passed out for a few seconds in the chair and then regained consciousness spontaneously. She denies feeling chest pain or SOB at any point. She states she has had multiple syncopal episodes in the past, usually when getting blood drawn, though no recent episodes. Of note, pt states she tried crack cocaine last night for the first time and did not like how it made her feel. She states that since last night she has just felt "off" and is unable to elaborate further. She states in the ED now she feels back to baseline. She denies marijuana or other illicit drug use. Endorses occasional EtOH and smokes <1/2 PPD. Denies chest pain or SOB. She does endorse some mild lower abdominal pain and dysuria. Denies increased urinary frequency or urgency.   Past Medical History  Diagnosis Date  . Hypertension   . Heart murmur   . GERD (gastroesophageal reflux disease)   . Headache(784.0)     MIGRAINE  . Anxiety   . Depression   . Incontinence   . IBS (irritable bowel syndrome)   . MVP (mitral valve prolapse)   . Hiatal hernia   . RLS (restless legs syndrome)   . Anemia 2012   Past Surgical History  Procedure Laterality Date  . Tonsillectomy    . Knee arthroscopy  2010  . Laparoscopic assisted vaginal hysterectomy  06/17/2011    Procedure: LAPAROSCOPIC ASSISTED VAGINAL HYSTERECTOMY;  Surgeon: Arloa Koh;  Location: [facility];  Service: Gynecology;  Laterality: N/A;  Attempted   .  Salpingoophorectomy  06/17/2011    Procedure: SALPINGO OOPHERECTOMY;  Surgeon: Arloa Koh;  Location: [facility];  Service: Gynecology;  Laterality: Bilateral;  . Abdominal hysterectomy  06/17/2011    Procedure: HYSTERECTOMY ABDOMINAL;  Surgeon: Arloa Koh;  Location: [facility];  Service: Gynecology;  Laterality: N/A;  Converted to Abdominal Hysterectomy with lysis of adhesions   . Bladder suspension  02/25/2012    Procedure: TRANSVAGINAL TAPE (TVT) PROCEDURE;  Surgeon: Daria Pastures, MD;  Location: [facility];  Service: Gynecology;  Laterality: N/A;  . Cystoscopy  02/25/2012    Procedure: CYSTOSCOPY;  Surgeon: Daria Pastures, MD;  Location: [facility];  Service: Gynecology;  Laterality: N/A;  . Anterior and posterior repair  02/25/2012    Procedure: ANTERIOR (CYSTOCELE) AND POSTERIOR REPAIR (RECTOCELE);  Surgeon: Daria Pastures, MD;  Location: [facility];  Service: Gynecology;  Laterality: N/A;  ANterior cystocele repair ONLY   Family History  Problem Relation Age of Onset  . Diabetes Mother   . Clotting disorder Father    Social History  Substance Use Topics  . Smoking status: Current Every Day Smoker -- 0.50 packs/day    Types: Cigarettes    Last Attempt to Quit: 12/07/2011  . Smokeless tobacco: Never Used  . Alcohol Use: Yes     Comment: socially   OB History    No data available  Review of Systems  All other systems reviewed and are negative.     Allergies  Iodine  Home Medications   Prior to Admission medications   Medication Sig Start Date End Date Taking? Authorizing Provider  atenolol-chlorthalidone (TENORETIC) 50-25 MG tablet Take 1 tablet by mouth daily. 10/15/15   Denita Lung, MD  esomeprazole (NEXIUM) 40 MG capsule TAKE 1 CAPSULE TWICE A DAY 10/01/15   Denita Lung, MD  pramipexole (MIRAPEX) 1 MG tablet Take 1 tablet (1 mg total) by mouth daily. 10/01/15   Denita Lung, MD  venlafaxine XR (EFFEXOR-XR) 150 MG 24 hr capsule Take 2 of the 150mg  tablets to  equal 300mg  daily. 10/01/15   Denita Lung, MD   BP 106/68 mmHg  Pulse 56  Temp(Src) 98.4 F (36.9 C) (Oral)  Resp 13  SpO2 99%  LMP 06/07/2011 Physical Exam  Constitutional: She is oriented to person, place, and time.  HENT:  Right Ear: External ear normal.  Left Ear: External ear normal.  Nose: Nose normal.  Mouth/Throat: Oropharynx is clear and moist. No oropharyngeal exudate.  Eyes: Conjunctivae and EOM are normal. Pupils are equal, round, and reactive to light.  Neck: Normal range of motion. Neck supple.  Cardiovascular: Normal rate, regular rhythm, normal heart sounds and intact distal pulses.   Pulmonary/Chest: Effort normal and breath sounds normal. No respiratory distress. She has no wheezes. She exhibits no tenderness.  Abdominal: Soft. Bowel sounds are normal. She exhibits no distension. There is no tenderness.  Musculoskeletal: She exhibits no edema.  Neurological: She is alert and oriented to person, place, and time. She has normal strength. No cranial nerve deficit or sensory deficit. Coordination and gait normal.  Skin: Skin is warm and dry.  Psychiatric:  Subdued affect  Nursing note and vitals reviewed.  Orthostatic VS for the past 24 hrs:  BP- Lying Pulse- Lying BP- Sitting Pulse- Sitting BP- Standing at 0 minutes Pulse- Standing at 0 minutes  11/14/15 1639 107/69 mmHg 56 119/80 mmHg 63 118/76 mmHg 58      ED Course  Procedures (including critical care time) Labs Review Labs Reviewed  BASIC METABOLIC PANEL - Abnormal; Notable for the following:    Potassium 3.2 (*)    Glucose, Bld 157 (*)    Calcium 8.7 (*)    All other components within normal limits  URINE RAPID DRUG SCREEN, HOSP PERFORMED - Abnormal; Notable for the following:    Cocaine POSITIVE (*)    All other components within normal limits  URINALYSIS, ROUTINE W REFLEX MICROSCOPIC (NOT AT [facility]) - Abnormal; Notable for the following:    Color, Urine AMBER (*)    APPearance CLOUDY (*)    Hgb  urine dipstick TRACE (*)    Leukocytes, UA LARGE (*)    All other components within normal limits  ACETAMINOPHEN LEVEL - Abnormal; Notable for the following:    Acetaminophen (Tylenol), Serum <10 (*)    All other components within normal limits  URINE MICROSCOPIC-ADD ON - Abnormal; Notable for the following:    Squamous Epithelial / LPF 6-30 (*)    Bacteria, UA FEW (*)    All other components within normal limits  CBG MONITORING, ED - Abnormal; Notable for the following:    Glucose-Capillary 100 (*)    All other components within normal limits  URINE CULTURE  CBC WITH DIFFERENTIAL/PLATELET  ETHANOL  SALICYLATE LEVEL  I-STAT TROPOININ, ED    Imaging Review Dg Chest 2 View  11/14/2015  CLINICAL DATA:  Recent syncopal episode and shortness of Breath EXAM: CHEST - 2 VIEW COMPARISON:  None. FINDINGS: The heart size and mediastinal contours are within normal limits. Both lungs are clear. The visualized skeletal structures are unremarkable. IMPRESSION: No active disease. Electronically Signed   By: Inez Catalina M.D.   On: 11/14/2015 16:21   I have personally reviewed and evaluated these images and lab results as part of my medical decision-making.   EKG Interpretation   Date/Time:  Wednesday November 14 2015 15:49:21 EST Ventricular Rate:  56 PR Interval:  167 QRS Duration: 116 QT Interval:  463 QTC Calculation: 447 R Axis:   -15 Text Interpretation:  Sinus rhythm Nonspecific intraventricular conduction  delay no STEMI. NO SIG CHANGE Confirmed by Johnney Killian, MD, Jeannie Done [phone]) on  11/14/2015 4:57:10 PM       MDM   Final diagnoses:  Syncope, unspecified syncope type  Trichomoniasis  Urinary tract infection with hematuria, site unspecified  Hypokalemia    Labs unrevealing for cause of syncopal episode. K+ slightly low at 3.2 so will give K-dur for supplementation. UA shows evidence of infection with trich. Dicussed findings with pt and treated with Flagyl in the ED. She is  declining pelvic exam today. Denies vaginal discharge or symptoms. Pt is s/p total hysterectomy. Will also give rx for keflex to cover for other bacterial UTI. Will send urine for culture. Pt reports she feels back to baseline. I suspect syncopal episode secondary to heat and dehydration/poor PO intake. Encouraged drinking plenty of fluids throughout the day and small meals throughout the day if pt has a small appetite. Instructed to f/u with PCP for re-check of K+ and test of cure for trich.   Upon discharge pt requesting outpatient resources for mental health. She reports she feels depressed. Denies SI/HI. SHe is on effexor currently. Resource guide given.     Anne Ng, PA-C 11/14/15 Dennis Port, MD 11/15/15 [phone]

## 2015-11-16 LAB — URINE CULTURE

## 2016-01-28 ENCOUNTER — Encounter (HOSPITAL_COMMUNITY): Payer: Self-pay | Admitting: Emergency Medicine

## 2016-01-28 ENCOUNTER — Emergency Department (HOSPITAL_COMMUNITY)
Admission: EM | Admit: 2016-01-28 | Discharge: 2016-01-28 | Disposition: A | Discharge status: Home / Self Care | Payer: Self-pay | Attending: Emergency Medicine | Admitting: Emergency Medicine

## 2016-01-28 DIAGNOSIS — Z79899 Other long term (current) drug therapy: Secondary | ICD-10-CM | POA: Insufficient documentation

## 2016-01-28 DIAGNOSIS — R011 Cardiac murmur, unspecified: Secondary | ICD-10-CM | POA: Insufficient documentation

## 2016-01-28 DIAGNOSIS — S0033XA Contusion of nose, initial encounter: Secondary | ICD-10-CM | POA: Insufficient documentation

## 2016-01-28 DIAGNOSIS — F419 Anxiety disorder, unspecified: Secondary | ICD-10-CM | POA: Insufficient documentation

## 2016-01-28 DIAGNOSIS — Y998 Other external cause status: Secondary | ICD-10-CM | POA: Insufficient documentation

## 2016-01-28 DIAGNOSIS — Y9289 Other specified places as the place of occurrence of the external cause: Secondary | ICD-10-CM | POA: Insufficient documentation

## 2016-01-28 DIAGNOSIS — Y9389 Activity, other specified: Secondary | ICD-10-CM | POA: Insufficient documentation

## 2016-01-28 DIAGNOSIS — F17210 Nicotine dependence, cigarettes, uncomplicated: Secondary | ICD-10-CM | POA: Insufficient documentation

## 2016-01-28 DIAGNOSIS — S00511A Abrasion of lip, initial encounter: Secondary | ICD-10-CM | POA: Insufficient documentation

## 2016-01-28 DIAGNOSIS — I10 Essential (primary) hypertension: Secondary | ICD-10-CM | POA: Insufficient documentation

## 2016-01-28 DIAGNOSIS — S0993XA Unspecified injury of face, initial encounter: Secondary | ICD-10-CM | POA: Insufficient documentation

## 2016-01-28 DIAGNOSIS — G2581 Restless legs syndrome: Secondary | ICD-10-CM | POA: Insufficient documentation

## 2016-01-28 DIAGNOSIS — K219 Gastro-esophageal reflux disease without esophagitis: Secondary | ICD-10-CM | POA: Insufficient documentation

## 2016-01-28 DIAGNOSIS — Z862 Personal history of diseases of the blood and blood-forming organs and certain disorders involving the immune mechanism: Secondary | ICD-10-CM | POA: Insufficient documentation

## 2016-01-28 DIAGNOSIS — S0081XA Abrasion of other part of head, initial encounter: Secondary | ICD-10-CM | POA: Insufficient documentation

## 2016-01-28 DIAGNOSIS — Z792 Long term (current) use of antibiotics: Secondary | ICD-10-CM | POA: Insufficient documentation

## 2016-01-28 MED ORDER — DIAZEPAM 5 MG PO TABS
5.0000 mg | ORAL_TABLET | Freq: Once | ORAL | Status: AC
  Administered 2016-01-28: 5 mg via ORAL
  Filled 2016-01-28: qty 1

## 2016-01-28 MED ORDER — DIAZEPAM 5 MG PO TABS: 5.0000 mg | ORAL_TABLET | Freq: Two times a day (BID) | ORAL | Status: DC

## 2016-01-28 NOTE — Discharge Instructions (Signed)
Facial or Scalp Contusion A facial or scalp contusion is a deep bruise on the face or head. Injuries to the face and head generally cause a lot of swelling, especially around the eyes. Contusions are the result of an injury that caused bleeding under the skin. The contusion may turn blue, purple, or yellow. Minor injuries will give you a painless contusion, but more severe contusions may stay painful and swollen for a few weeks.  CAUSES  A facial or scalp contusion is caused by a blunt injury or trauma to the face or head area.  SIGNS AND SYMPTOMS   Swelling of the injured area.   Discoloration of the injured area.   Tenderness, soreness, or pain in the injured area.  DIAGNOSIS  The diagnosis can be made by taking a medical history and doing a physical exam. An X-ray exam, CT scan, or MRI may be needed to determine if there are any associated injuries, such as broken bones (fractures). TREATMENT  Often, the best treatment for a facial or scalp contusion is applying cold compresses to the injured area. Over-the-counter medicines may also be recommended for pain control.  HOME CARE INSTRUCTIONS   Only take over-the-counter or prescription medicines as directed by your health care provider.   Apply ice to the injured area.   Put ice in a plastic bag.   Place a towel between your skin and the bag.   Leave the ice on for 20 minutes, 2-3 times a day.  SEEK MEDICAL CARE IF:  You have bite problems.   You have pain with chewing.   You are concerned about facial defects. SEEK IMMEDIATE MEDICAL CARE IF:  You have severe pain or a headache that is not relieved by medicine.   You have unusual sleepiness, confusion, or personality changes.   You throw up (vomit).   You have a persistent nosebleed.   You have double vision or blurred vision.   You have fluid drainage from your nose or ear.   You have difficulty walking or using your arms or legs.  MAKE SURE YOU:    Understand these instructions.  Will watch your condition.  Will get help right away if you are not doing well or get worse.   This information is not intended to replace advice given to you by your health care provider. Make sure you discuss any questions you have with your health care provider.   Document Released: 11/27/2004 Document Revised: 11/10/2014 Document Reviewed: 06/02/2013 Elsevier Interactive Patient Education 2016 [place] A contusion is a deep bruise. Contusions are the result of a blunt injury to tissues and muscle fibers under the skin. The injury causes bleeding under the skin. The skin overlying the contusion may turn blue, purple, or yellow. Minor injuries will give you a painless contusion, but more severe contusions may stay painful and swollen for a few weeks.  CAUSES  This condition is usually caused by a blow, trauma, or direct force to an area of the body. SYMPTOMS  Symptoms of this condition include:  Swelling of the injured area.  Pain and tenderness in the injured area.  Discoloration. The area may have redness and then turn blue, purple, or yellow. DIAGNOSIS  This condition is diagnosed based on a physical exam and medical history. An X-ray, CT scan, or MRI may be needed to determine if there are any associated injuries, such as broken bones (fractures). TREATMENT  Specific treatment for this condition depends on what area of  the body was injured. In general, the best treatment for a contusion is resting, icing, applying pressure to (compression), and elevating the injured area. This is often called the RICE strategy. Over-the-counter anti-inflammatory medicines may also be recommended for pain control.  HOME CARE INSTRUCTIONS   Rest the injured area.  If directed, apply ice to the injured area:  Put ice in a plastic bag.  Place a towel between your skin and the bag.  Leave the ice on for 20 minutes, 2-3 times per day.  If  directed, apply light compression to the injured area using an elastic bandage. Make sure the bandage is not wrapped too tightly. Remove and reapply the bandage as directed by your health care provider.  If possible, raise (elevate) the injured area above the level of your heart while you are sitting or lying down.  Take over-the-counter and prescription medicines only as told by your health care provider. SEEK MEDICAL CARE IF:  Your symptoms do not improve after several days of treatment.  Your symptoms get worse.  You have difficulty moving the injured area. SEEK IMMEDIATE MEDICAL CARE IF:   You have severe pain.  You have numbness in a hand or foot.  Your hand or foot turns pale or cold.   This information is not intended to replace advice given to you by your health care provider. Make sure you discuss any questions you have with your health care provider.   Document Released: 07/30/2005 Document Revised: 07/11/2015 Document Reviewed: 03/07/2015 Elsevier Interactive Patient Education 2016 [place] Assault Assault includes any behavior or physical attack--whether it is on purpose or not--that results in injury to another person, damage to property, or both. This also includes assault that has not yet happened, but is planned to happen. Threats of assault may be physical, verbal, or written. They may be said or sent by:  Mail.  E-mail.  Text.  Social media.  Fax. The threats may be direct, implied, or understood. WHAT ARE THE DIFFERENT FORMS OF ASSAULT? Forms of assault include:  Physically assaulting a person. This includes physical threats to inflict physical harm as well as:  Slapping.  Hitting.  Poking.  Kicking.  Punching.  Pushing.  Sexually assaulting a person. Sexual assault is any sexual activity that a person is forced, threatened, or coerced to participate in. It may or may not involve physical contact with the person who is  assaulting you. You are sexually assaulted if you are forced to have sexual contact of any kind.  Damaging or destroying a person's assistive equipment, such as glasses, canes, or walkers.  Throwing or hitting objects.  Using or displaying a weapon to harm or threaten someone.  Using or displaying an object that appears to be a weapon in a threatening manner.  Using greater physical size or strength to intimidate someone.  Making intimidating or threatening gestures.  Bullying.  Hazing.  Using language that is intimidating, threatening, hostile, or abusive.  Stalking.  Restraining someone with force. WHAT SHOULD I DO IF I EXPERIENCE ASSAULT?  Report assaults, threats, and stalking to the police. Call your local emergency services (911 in the [place] if you are in immediate danger or you need medical help.  You can work with a Chief Executive Officer or an advocate to get legal protection against someone who has assaulted you or threatened you with assault. Protection includes restraining orders and private addresses. Crimes against you, such as assault, can also be prosecuted through the courts. Laws  will vary depending on where you live.   This information is not intended to replace advice given to you by your health care provider. Make sure you discuss any questions you have with your health care provider.   Document Released: 10/20/2005 Document Revised: 11/10/2014 Document Reviewed: 07/07/2014 Elsevier Interactive Patient Education 2016 [place] [address] is a great source of information about community services available.  Access by dialing 2-1-1 from anywhere in [place], or by website -  Cust[employer]ian[employer].fi.   Other Local Resources (Updated 11/2015)  Abuse and  Neglect     Services     Phone Number and Address  [place]: Child/ Elder Abuse  Hotline - call 24 hours a day, 7 days a week to report abuse and neglect  or children or adults [address] [place], [place] [address]  [place]: Child/Elder Abuse   Hotline - call 24 hours a day, 7 days a week to report abuse and neglect of children or adults [place] [address] [place], [place] [address]  [facility]   Hotline - call 24 hours a day, 7 days a week for support for people who have been sexually assaulted    Confidential counseling [phone] [place], [place] [address]  [facility] [place] - call 24-hours a day, 7 days a week for support and emergency housing for women dealing with domestic violence and their children  Temporary housing  Support in court  Supervised visitation  Support groups [place]: Child/Elder Abuse   Hotline - call 24 hours a day, 7 days a week to report abuse and neglect or children or adults [place], [place] [address]  [place]: Child/Elder Abuse  Hotline - call 24 hours a day, 7 days a week to report abuse and neglect or children or adults [phone] (after-hours) [place], [place] [address]  National Domestic Violence Hotline   Hotline - call 24-hours a day, 7 days a week for support and emergency housing for people dealing with domestic violence [place]: Child/Elder Abuse   Hotline - call 24 hours a day, 7 days a week to report abuse and neglect or children or adults [place]. [address] [place], [place] [address]  [place]: Child/ Elder Abuse  Hotline - call 24 hours a day, 7 days a week to report abuse and neglect or  children or adults [phone] (after-hours) [facility] [place], [place] [address]
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
## 2016-01-29 ENCOUNTER — Telehealth: Payer: Self-pay | Admitting: Family Medicine

## 2016-01-29 NOTE — Telephone Encounter (Signed)
Called pt and left message to schedule a hospital f/u appt

## 2016-01-31 ENCOUNTER — Encounter: Payer: Self-pay | Admitting: Family Medicine

## 2016-01-31 ENCOUNTER — Ambulatory Visit (INDEPENDENT_AMBULATORY_CARE_PROVIDER_SITE_OTHER): Payer: Self-pay | Admitting: Family Medicine

## 2016-01-31 DIAGNOSIS — S0033XA Contusion of nose, initial encounter: Secondary | ICD-10-CM
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
## 2016-02-25 ENCOUNTER — Telehealth: Payer: Self-pay | Admitting: Family Medicine

## 2016-02-25 NOTE — Telephone Encounter (Signed)
She needs an evaluation. I really can't do anything unless I take a look at her and of course would be better if she had insurance
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
## 2016-02-25 NOTE — Telephone Encounter (Signed)
Pt informed and she will be here Wed. morning

## 2016-02-27 ENCOUNTER — Ambulatory Visit (INDEPENDENT_AMBULATORY_CARE_PROVIDER_SITE_OTHER): Payer: Self-pay | Admitting: Family Medicine

## 2016-02-27 VITALS — BP 110/60 | HR 68 | Wt 238.0 lb

## 2016-02-27 DIAGNOSIS — R55 Syncope and collapse: Secondary | ICD-10-CM

## 2016-02-27 DIAGNOSIS — I10 Essential (primary) hypertension: Secondary | ICD-10-CM

## 2016-02-27 MED ORDER — LISINOPRIL 10 MG PO TABS: 10.0000 mg | ORAL_TABLET | Freq: Every day | ORAL | Status: DC

## 2016-02-27 NOTE — Progress Notes (Signed)
   Subjective:    Patient ID: Cynthia [name], female    DOB: [birth year], 55 y.o.   MRN: [id number]  HPI She is here for consult concerning 2 episodes of syncope. Her most recent episode occurred Sunday while sitting. She noted the onset of decreased heart rate and tunnel vision. This was observed by someone else. When she woke up, she did feel tired. She has had no headache, weakness, numbness or tingling. The previous episode of syncope was reviewed and she had no symptoms at that time of decreased heart rate. Her medications were reviewed. She presently is on a beta blocker/diuretic.   Review of Systems     Objective:   Physical Exam Alert and in no distress. Tympanic membranes and canals are normal. Pharyngeal area is normal. Neck is supple without adenopathy or thyromegaly. Cardiac exam shows a regular sinus rhythm without murmurs or gallops. Lungs are clear to auscultation.        Assessment & Plan:  Essential hypertension - Plan: lisinopril (PRINIVIL,ZESTRIL) 10 MG tablet  Vasovagal syncope Her blood pressure is slightly on the low side and since she was on a beta blocker, I will switch her to lisinopril. Hopefully a slightly higher blood pressure reading and getting her off of beta blocker that might have contributed to this might make a difference.

## 2016-02-27 NOTE — Patient Instructions (Signed)
Stay well hydrated and check  your pressure every week or 2. If this occurs again be aware of any other symptoms you might have

## 2016-03-14 ENCOUNTER — Telehealth: Payer: Self-pay | Admitting: Family Medicine

## 2016-03-14 NOTE — Telephone Encounter (Signed)
Pt called and stated that her bp med was recently changed. She states that she now has swollen "terrible" legs and feet. She states that they are staying swollen 24/7. Pt was offered and appt but due to the fact that JCL out of office wanted me to send a message instead. Pt can be reached at [phone]. She uses [facility] [address]

## 2016-03-15 NOTE — Telephone Encounter (Signed)
Explained to her that the fluid status is her body getting used to not having a diuretic on board. That should stabilize soon

## 2016-03-17 NOTE — Telephone Encounter (Signed)
Left message to call me back.

## 2016-03-17 NOTE — Telephone Encounter (Signed)
Pt informed and verbalized understanding if not any better in couple weeks she will call back

## 2016-04-03 ENCOUNTER — Encounter: Payer: Self-pay | Admitting: Family Medicine

## 2016-04-03 ENCOUNTER — Ambulatory Visit (INDEPENDENT_AMBULATORY_CARE_PROVIDER_SITE_OTHER): Payer: BLUE CROSS/BLUE SHIELD | Admitting: Family Medicine

## 2016-04-03 VITALS — BP 180/100 | HR 85 | Temp 98.7°F | Wt 238.0 lb

## 2016-04-03 DIAGNOSIS — Z720 Tobacco use: Secondary | ICD-10-CM

## 2016-04-03 DIAGNOSIS — F17200 Nicotine dependence, unspecified, uncomplicated: Secondary | ICD-10-CM

## 2016-04-03 DIAGNOSIS — I10 Essential (primary) hypertension: Secondary | ICD-10-CM

## 2016-04-03 DIAGNOSIS — J0100 Acute maxillary sinusitis, unspecified: Secondary | ICD-10-CM | POA: Diagnosis not present

## 2016-04-03 DIAGNOSIS — W57XXXA Bitten or stung by nonvenomous insect and other nonvenomous arthropods, initial encounter: Secondary | ICD-10-CM | POA: Diagnosis not present

## 2016-04-03 DIAGNOSIS — S30861A Insect bite (nonvenomous) of abdominal wall, initial encounter: Secondary | ICD-10-CM

## 2016-04-03 NOTE — Patient Instructions (Signed)
Hold your blood pressure medicine for about 10 days to make sure that is not contributing to cough. Continue to work on quitting smoking. Take all the antibiotic and if not totally back to normal when you finish call me

## 2016-04-04 ENCOUNTER — Encounter: Payer: Self-pay | Admitting: Family Medicine

## 2016-04-04 MED ORDER — AMOXICILLIN-POT CLAVULANATE 875-125 MG PO TABS: 1.0000 | ORAL_TABLET | Freq: Two times a day (BID) | ORAL | Status: DC

## 2016-04-04 NOTE — Progress Notes (Signed)
   Subjective:    Patient ID: Cynthia [name], female    DOB: 06/17/1961, 55 y.o.   MRN: [id number]  HPI She complains of an over a week long history of difficulty with nasal congestion, sinus pressure, PND, productive cough as well as upper tooth discomfort. No fever, chills, chest pain or shortness of breath. She does smoke. She was also recently started on an antihypertensive medication. Approximately 1 week ago she was also bitten on the left flank area by a tick.   Review of Systems     Objective:   Physical Exam Alert and in no distress. Tympanic membranes and canals are normal. Pharyngeal area is normal. Neck is supple without adenopathy or thyromegaly. Cardiac exam shows a regular sinus rhythm without murmurs or gallops. Lungs are clear to auscultation. Exam of the left flank shows central insect bite with some erythema but in a very irregular pattern.        Assessment & Plan:  Acute maxillary sinusitis, recurrence not specified - Plan: amoxicillin-clavulanate (AUGMENTIN) 875-125 MG tablet  Current smoker  Essential hypertension  Tick bite of abdomen, initial encounter Reassured her that the tick bite is nothing to worry about. I will place her on Augmentin. Recommend she hold her antihypertensive medication since she is coughing. She will call if not better when she finishes antibiotics. Encouraged her to quit smoking.

## 2016-04-22 ENCOUNTER — Telehealth: Payer: Self-pay | Admitting: Family Medicine

## 2016-04-22 DIAGNOSIS — J0100 Acute maxillary sinusitis, unspecified: Secondary | ICD-10-CM

## 2016-04-22 MED ORDER — AMOXICILLIN-POT CLAVULANATE 875-125 MG PO TABS: 1.0000 | ORAL_TABLET | Freq: Two times a day (BID) | ORAL | Status: DC

## 2016-04-22 NOTE — Telephone Encounter (Signed)
Pt called and stated that she is still coughing. It is a productive cough but still ongoing. Pt has not taken bp meds but cough continues. Please advise pt.

## 2016-04-22 NOTE — Telephone Encounter (Signed)
She is roughly 80% better but still coughing. I will call in Augmentin again and have her hold her lisinopril again for another 10 days to hopefully diminish the likelihood of that continued into the cough.

## 2016-05-07 ENCOUNTER — Encounter: Payer: Self-pay | Admitting: Gastroenterology

## 2016-05-08 ENCOUNTER — Encounter: Payer: Self-pay | Admitting: Family Medicine

## 2016-05-08 ENCOUNTER — Ambulatory Visit (INDEPENDENT_AMBULATORY_CARE_PROVIDER_SITE_OTHER): Payer: BLUE CROSS/BLUE SHIELD | Admitting: Family Medicine

## 2016-05-08 ENCOUNTER — Other Ambulatory Visit: Payer: Self-pay

## 2016-05-08 VITALS — BP 150/90 | HR 91 | Wt 237.6 lb

## 2016-05-08 DIAGNOSIS — F17200 Nicotine dependence, unspecified, uncomplicated: Secondary | ICD-10-CM

## 2016-05-08 DIAGNOSIS — I10 Essential (primary) hypertension: Secondary | ICD-10-CM | POA: Diagnosis not present

## 2016-05-08 DIAGNOSIS — J209 Acute bronchitis, unspecified: Secondary | ICD-10-CM

## 2016-05-08 DIAGNOSIS — R05 Cough: Secondary | ICD-10-CM | POA: Diagnosis not present

## 2016-05-08 DIAGNOSIS — Z716 Tobacco abuse counseling: Secondary | ICD-10-CM

## 2016-05-08 DIAGNOSIS — H43392 Other vitreous opacities, left eye: Secondary | ICD-10-CM

## 2016-05-08 DIAGNOSIS — R058 Other specified cough: Secondary | ICD-10-CM

## 2016-05-08 DIAGNOSIS — T464X5A Adverse effect of angiotensin-converting-enzyme inhibitors, initial encounter: Secondary | ICD-10-CM

## 2016-05-08 LAB — CBC WITH DIFFERENTIAL/PLATELET
Basophils Absolute: 0 cells/uL (ref 0–200)
Basophils Relative: 0 %
Eosinophils Absolute: 390 cells/uL (ref 15–500)
Eosinophils Relative: 5 %
HCT: 39.7 % (ref 35.0–45.0)
Hemoglobin: 12.9 g/dL (ref 11.7–15.5)
Lymphocytes Relative: 28 %
Lymphs Abs: 2184 cells/uL (ref 850–3900)
MCH: 27.8 pg (ref 27.0–33.0)
MCHC: 32.5 g/dL (ref 32.0–36.0)
MCV: 85.6 fL (ref 80.0–100.0)
MPV: 10.5 fL (ref 7.5–12.5)
Monocytes Absolute: 390 cells/uL (ref 200–950)
Monocytes Relative: 5 %
Neutro Abs: 4836 cells/uL (ref 1500–7800)
Neutrophils Relative %: 62 %
Platelets: 282 10*3/uL (ref 140–400)
RBC: 4.64 MIL/uL (ref 3.80–5.10)
RDW: 14 % (ref 11.0–15.0)
WBC: 7.8 10*3/uL (ref 4.0–10.5)

## 2016-05-08 MED ORDER — VARENICLINE TARTRATE 0.5 MG X 11 & 1 MG X 42 PO MISC: ORAL | Status: DC

## 2016-05-08 MED ORDER — LOSARTAN POTASSIUM-HCTZ 50-12.5 MG PO TABS
1.0000 | ORAL_TABLET | Freq: Every day | ORAL | Status: DC
Start: 2016-05-08 — End: 2016-06-09

## 2016-05-08 NOTE — Progress Notes (Signed)
   Subjective:    Patient ID: Cynthia [name], female    DOB: 1961-06-24, 55 y.o.   MRN: [id number]  HPI She is here for multiple issues. She continues have difficulty with a productive cough. She recently finished 2 rounds of Augmentin and still complains of the productive cough. No fever, chills, shortness of breath, blood-tinged sputum, PND or rhinorrhea. She does note some rattling in her chest. She also has a 2 day history of seeing black dots and circles in her left eye. She notes no areas of visual blindness, headache, blurred vision. She also is interested in quitting smoking. She now realizes that she's been spending $1800 a year on smoking. She smokes a pack per day. She did use Chantix in the past with success but stress causes her to start smoking on one occasion and on another one she was on a vacation and had one cigarette which led to her starting to smoke again. She also has been off her lisinopril for several weeks stating that it did indeed help reduce the coughing.  Review of Systems     Objective:   Physical Exam Alert and in no distress. Anterior chamber cornea and conjunctivae all. Normal on the left Tympanic membranes and canals are normal. Pharyngeal area is normal. Neck is supple without adenopathy or thyromegaly. Cardiac exam shows a regular sinus rhythm without murmurs or gallops. Lungs are clear to auscultation.        Assessment & Plan:  Current smoker - Plan: DG Chest 2 View  Acute bronchitis, unspecified organism - Plan: DG Chest 2 View, CBC with Differential/Platelet, Comprehensive metabolic panel  Encounter for smoking cessation counseling - Plan: DISCONTINUED: varenicline (CHANTIX STARTING MONTH PAK) 0.5 MG X 11 & 1 MG X 42 tablet  ACE-inhibitor cough  Essential hypertension - Plan: losartan-hydrochlorothiazide (HYZAAR) 50-12.5 MG tablet  Floaters in visual field, left - Plan: Ambulatory referral to Ophthalmology Since she is not totally responded I  think blood work and x-ray as appropriate. If this is normal I will probably place her on a quinolone. Hyzaar is added to her regimen and will recheck that in one month. She will also be referred to ophthalmology. Smoking cessation was discussed with her in detail. She has a good handle on how to take care of this. I did strongly encourage her to make a list of things to do instead of smoking. She'll return here in one month for reevaluation of that as well.

## 2016-05-08 NOTE — Patient Instructions (Signed)
Look at when, where and why you smoke and then come up with a list that you carry with you on what you will do instead of smoking. Get a clear glass jar and every day your smoke-free throw and $5 if you make it 3 months then you can use that money on anything you want. If you don't make it then you have to give the money away to somebody or something that you really don't want him to get your money.

## 2016-05-09 ENCOUNTER — Ambulatory Visit
Admission: RE | Admit: 2016-05-09 | Discharge: 2016-05-09 | Disposition: A | Discharge status: Home / Self Care | Payer: BLUE CROSS/BLUE SHIELD | Source: Ambulatory Visit | Attending: Family Medicine | Admitting: Family Medicine

## 2016-05-09 DIAGNOSIS — J209 Acute bronchitis, unspecified: Secondary | ICD-10-CM

## 2016-05-09 DIAGNOSIS — F17200 Nicotine dependence, unspecified, uncomplicated: Secondary | ICD-10-CM

## 2016-05-12 ENCOUNTER — Telehealth: Payer: Self-pay | Admitting: Family Medicine

## 2016-05-12 LAB — COMPREHENSIVE METABOLIC PANEL
ALT: 12 U/L (ref 6–29)
AST: 16 U/L (ref 10–35)
Albumin: 3.4 g/dL — ABNORMAL LOW (ref 3.6–5.1)
Alkaline Phosphatase: 79 U/L (ref 33–130)
BUN: 13 mg/dL (ref 7–25)
CO2: 24 mmol/L (ref 20–31)
Calcium: 8.9 mg/dL (ref 8.6–10.4)
Chloride: 108 mmol/L (ref 98–110)
Creat: 0.94 mg/dL (ref 0.50–1.05)
Glucose, Bld: 93 mg/dL (ref 65–99)
Potassium: 4.3 mmol/L (ref 3.5–5.3)
Sodium: 142 mmol/L (ref 135–146)
Total Bilirubin: 0.3 mg/dL (ref 0.2–1.2)
Total Protein: 6.7 g/dL (ref 6.1–8.1)

## 2016-05-12 NOTE — Telephone Encounter (Signed)
Caryl Pina from [facility] called  Amended lab result is creatinine  .24   Labs will also come over system

## 2016-05-13 ENCOUNTER — Telehealth: Payer: Self-pay | Admitting: Family Medicine

## 2016-05-13 ENCOUNTER — Other Ambulatory Visit: Payer: Self-pay

## 2016-05-13 MED ORDER — VARENICLINE TARTRATE 1 MG PO TABS: 1.0000 mg | ORAL_TABLET | Freq: Two times a day (BID) | ORAL | Status: DC

## 2016-05-13 NOTE — Telephone Encounter (Signed)
Pt called and wanted to know if she needed another refill on medication and she is requesting Chantix. Pt uses [facility] [address]

## 2016-05-13 NOTE — Telephone Encounter (Signed)
Let her know that I called the medicine in

## 2016-05-13 NOTE — Telephone Encounter (Signed)
Pt informed and verbalized understanding

## 2016-06-09 ENCOUNTER — Ambulatory Visit: Payer: BLUE CROSS/BLUE SHIELD | Admitting: Family Medicine

## 2016-06-09 ENCOUNTER — Encounter: Payer: Self-pay | Admitting: Family Medicine

## 2016-06-09 ENCOUNTER — Ambulatory Visit (INDEPENDENT_AMBULATORY_CARE_PROVIDER_SITE_OTHER): Payer: BLUE CROSS/BLUE SHIELD | Admitting: Family Medicine

## 2016-06-09 VITALS — BP 110/70 | HR 88 | Wt 230.0 lb

## 2016-06-09 DIAGNOSIS — J029 Acute pharyngitis, unspecified: Secondary | ICD-10-CM

## 2016-06-09 DIAGNOSIS — F17200 Nicotine dependence, unspecified, uncomplicated: Secondary | ICD-10-CM

## 2016-06-09 DIAGNOSIS — I10 Essential (primary) hypertension: Secondary | ICD-10-CM

## 2016-06-09 DIAGNOSIS — Z720 Tobacco use: Secondary | ICD-10-CM | POA: Diagnosis not present

## 2016-06-09 LAB — POCT RAPID STREP A (OFFICE): Rapid Strep A Screen: NEGATIVE

## 2016-06-09 MED ORDER — LOSARTAN POTASSIUM-HCTZ 50-12.5 MG PO TABS: 1.0000 | ORAL_TABLET | Freq: Every day | ORAL | 3 refills | Status: DC

## 2016-06-09 NOTE — Progress Notes (Signed)
   Subjective:    Patient ID: Cynthia [name], female    DOB: 1961-01-18, 55 y.o.   MRN: [id number]  HPI Is here for blood pk. She is now on Hyzaar and has also noted a decrease in fluid retention. She also complains of a 2 day history of sore throat but no fever, chills, cough or coe does have slight ear comfort. She is in the process of quitting smoking and does have a quit date.She started Chantix today.   Review of Systems     Objective:   Physical Exam Alert and in no distress. Tympanic membranes and canals are normal. Pharyngeal area is normal. Neck is supple without adenopathy or thyromegaly. Cardiac exam shows a regular sinus rhythm without murmurs or gallops. Lungs are clear to auscultation. Strep screen is negative       Assessment & Plan:  Current smoker  Essential hypertension  Acute pharyngitis, unspecified etiology - Plan: Rapid Strep A Continue on blood pressure medication. I will call in a 90 day course. Symptomatic care for the pharyngitis. Encouraged her to continue with her smoking cessation and return here if further difficulty.

## 2016-07-06 ENCOUNTER — Emergency Department (HOSPITAL_BASED_OUTPATIENT_CLINIC_OR_DEPARTMENT_OTHER)
Admission: EM | Admit: 2016-07-06 | Discharge: 2016-07-06 | Disposition: A | Discharge status: Home / Self Care | Payer: BLUE CROSS/BLUE SHIELD | Attending: Emergency Medicine | Admitting: Emergency Medicine

## 2016-07-06 ENCOUNTER — Encounter (HOSPITAL_BASED_OUTPATIENT_CLINIC_OR_DEPARTMENT_OTHER): Payer: Self-pay | Admitting: Emergency Medicine

## 2016-07-06 ENCOUNTER — Emergency Department (HOSPITAL_BASED_OUTPATIENT_CLINIC_OR_DEPARTMENT_OTHER): Payer: BLUE CROSS/BLUE SHIELD

## 2016-07-06 DIAGNOSIS — Y999 Unspecified external cause status: Secondary | ICD-10-CM | POA: Insufficient documentation

## 2016-07-06 DIAGNOSIS — S5011XA Contusion of right forearm, initial encounter: Secondary | ICD-10-CM | POA: Insufficient documentation

## 2016-07-06 DIAGNOSIS — I10 Essential (primary) hypertension: Secondary | ICD-10-CM | POA: Insufficient documentation

## 2016-07-06 DIAGNOSIS — Y929 Unspecified place or not applicable: Secondary | ICD-10-CM | POA: Insufficient documentation

## 2016-07-06 DIAGNOSIS — S50811A Abrasion of right forearm, initial encounter: Secondary | ICD-10-CM

## 2016-07-06 DIAGNOSIS — T1490XA Injury, unspecified, initial encounter: Secondary | ICD-10-CM

## 2016-07-06 DIAGNOSIS — F17210 Nicotine dependence, cigarettes, uncomplicated: Secondary | ICD-10-CM | POA: Insufficient documentation

## 2016-07-06 DIAGNOSIS — Z79899 Other long term (current) drug therapy: Secondary | ICD-10-CM | POA: Insufficient documentation

## 2016-07-06 DIAGNOSIS — S0083XA Contusion of other part of head, initial encounter: Secondary | ICD-10-CM | POA: Insufficient documentation

## 2016-07-06 DIAGNOSIS — Y939 Activity, unspecified: Secondary | ICD-10-CM | POA: Insufficient documentation

## 2016-07-06 MED ORDER — IBUPROFEN 800 MG PO TABS
800.0000 mg | ORAL_TABLET | Freq: Once | ORAL | Status: AC
  Administered 2016-07-06: 800 mg via ORAL
  Filled 2016-07-06: qty 1
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
## 2016-10-06 ENCOUNTER — Other Ambulatory Visit: Payer: Self-pay | Admitting: Family Medicine

## 2016-10-06 DIAGNOSIS — K2100 Gastro-esophageal reflux disease with esophagitis, without bleeding: Secondary | ICD-10-CM

## 2016-10-14 ENCOUNTER — Other Ambulatory Visit: Payer: Self-pay | Admitting: Family Medicine

## 2016-10-14 DIAGNOSIS — G2581 Restless legs syndrome: Secondary | ICD-10-CM

## 2016-10-14 DIAGNOSIS — G43909 Migraine, unspecified, not intractable, without status migrainosus: Secondary | ICD-10-CM

## 2016-10-14 NOTE — Telephone Encounter (Signed)
Is this okay to refill? 

## 2017-03-09 ENCOUNTER — Telehealth: Payer: Self-pay | Admitting: Family Medicine

## 2017-03-09 MED ORDER — SULFAMETHOXAZOLE-TRIMETHOPRIM 800-160 MG PO TABS: 1.0000 | ORAL_TABLET | Freq: Two times a day (BID) | ORAL | 0 refills | Status: DC

## 2017-03-09 NOTE — Telephone Encounter (Signed)
Pt informed

## 2017-03-09 NOTE — Telephone Encounter (Signed)
Pt called & states has UTI, burning & pain with urination, frequency & urgency.  She does not have insurance and doesn't have the money for office visit & you don't have any openings left today.  Can you call in antibiotic to [facility] or can she just come in for nurse visit for U/A?

## 2017-03-09 NOTE — Telephone Encounter (Signed)
Let her know that I called in an antibiotic in.

## 2017-03-09 NOTE — Telephone Encounter (Signed)
She is having difficulty with urgency and frequency as well as dysuria. I will call in Septra.

## 2017-03-09 NOTE — Telephone Encounter (Signed)
Called pt.

## 2017-05-01 ENCOUNTER — Encounter: Payer: Self-pay | Admitting: Medical

## 2017-05-01 ENCOUNTER — Ambulatory Visit (INDEPENDENT_AMBULATORY_CARE_PROVIDER_SITE_OTHER): Payer: Self-pay | Admitting: Medical

## 2017-05-01 VITALS — BP 132/70 | HR 88 | Temp 98.0°F | Resp 16 | Wt 230.0 lb

## 2017-05-01 DIAGNOSIS — H65191 Other acute nonsuppurative otitis media, right ear: Secondary | ICD-10-CM

## 2017-05-01 DIAGNOSIS — J3489 Other specified disorders of nose and nasal sinuses: Secondary | ICD-10-CM

## 2017-05-01 MED ORDER — AMOXICILLIN 500 MG PO TABS: ORAL_TABLET | ORAL | 0 refills | Status: DC

## 2017-05-01 NOTE — Progress Notes (Signed)
Subjective:   Cynthia [name] is a 56 y.o. female who presents with ear pressure on right, sinus pressure, some congestion x 1 day.  No fever, no sore throat, no cough.   She did have stomach virus symptoms this past week with nausea, vomiting and diarrhea but that resolved.   She has had sick contact with cough/congestion.    Using nothing for symptoms. no other aggravating or relieving factors.  No other c/o.  Past Medical History:  Diagnosis Date  . Anemia 2012  . Anxiety   . Depression   . GERD (gastroesophageal reflux disease)   . Headache(784.0)    MIGRAINE  . Heart murmur   . Hiatal hernia   . Hypertension   . IBS (irritable bowel syndrome)   . Incontinence   . MVP (mitral valve prolapse)   . RLS (restless legs syndrome)     ROS as in subjective   Objective: BP 132/70   Pulse 88   Temp 98 F (36.7 C) (Oral)   Resp 16   Wt 230 lb (104.3 kg)   LMP 06/10/2011   SpO2 95%   BMI 39.48 kg/m    General appearance: Alert, WD/WN, no distress, mildly ill appearing                             Skin: warm, no rash                           Head: +maxillary sinus tenderness                            Eyes: conjunctiva normal, corneas clear, PERRLA                            Ears: left TM pearly, right TM with mild erythema, serous fluid behind TM somewhat bulging, external ear canals normal                          Nose: septum midline, turbinates swollen, with erythema and clear discharge             Mouth/throat: MMM, tongue normal, mild pharyngeal erythema                           Neck: supple, no adenopathy, no thyromegaly, nontender                       Lungs: CTA bilaterally, no wheezes, rales, or rhonchi      Assessment  Encounter Diagnoses  Name Primary?  . Other acute nonsuppurative otitis media of right ear, recurrence not specified Yes  . Sinus pressure       Plan: Advised few days of OTC benadryl or mucinex, but if worse sinus pressure and ear pain int  the coming days, then begin amoxicillin.   Rest, hydrate well, consider neti pot.  Patient was advised to call or return if worse or not improving in the next few days.   Patient voiced understanding of diagnosis, recommendations, and treatment plan.

## 2017-05-07 ENCOUNTER — Telehealth: Payer: Self-pay | Admitting: Family Medicine

## 2017-05-07 DIAGNOSIS — K2100 Gastro-esophageal reflux disease with esophagitis, without bleeding: Secondary | ICD-10-CM

## 2017-05-07 DIAGNOSIS — G2581 Restless legs syndrome: Secondary | ICD-10-CM

## 2017-05-07 DIAGNOSIS — G43909 Migraine, unspecified, not intractable, without status migrainosus: Secondary | ICD-10-CM

## 2017-05-07 DIAGNOSIS — I10 Essential (primary) hypertension: Secondary | ICD-10-CM

## 2017-05-07 MED ORDER — ESOMEPRAZOLE MAGNESIUM 40 MG PO CPDR: 40.0000 mg | DELAYED_RELEASE_CAPSULE | Freq: Two times a day (BID) | ORAL | 0 refills | Status: DC

## 2017-05-07 MED ORDER — PRAMIPEXOLE DIHYDROCHLORIDE 1 MG PO TABS: 1.0000 mg | ORAL_TABLET | Freq: Every day | ORAL | 0 refills | Status: DC

## 2017-05-07 MED ORDER — VENLAFAXINE HCL ER 150 MG PO CP24: ORAL_CAPSULE | ORAL | 0 refills | Status: DC

## 2017-05-07 MED ORDER — LOSARTAN POTASSIUM-HCTZ 50-12.5 MG PO TABS: 1.0000 | ORAL_TABLET | Freq: Every day | ORAL | 0 refills | Status: DC

## 2017-05-07 NOTE — Telephone Encounter (Signed)
ALERT PT HAS A NEW PHARMACY. Pt scheduled a appt for 06/24/2017. Pt does not have ins and it is a lot less expensive for a 90 day supply. Please send in a 90 day supply of esomeprazole, Hyzaar, Mirapex and Effexor XR to [facility].

## 2017-06-24 ENCOUNTER — Ambulatory Visit (INDEPENDENT_AMBULATORY_CARE_PROVIDER_SITE_OTHER): Payer: Self-pay | Admitting: Family Medicine

## 2017-06-24 ENCOUNTER — Encounter: Payer: Self-pay | Admitting: Family Medicine

## 2017-06-24 VITALS — BP 128/74 | HR 90 | Wt 236.2 lb

## 2017-06-24 DIAGNOSIS — I10 Essential (primary) hypertension: Secondary | ICD-10-CM

## 2017-06-24 DIAGNOSIS — E669 Obesity, unspecified: Secondary | ICD-10-CM

## 2017-06-24 DIAGNOSIS — G2581 Restless legs syndrome: Secondary | ICD-10-CM

## 2017-06-24 DIAGNOSIS — F17200 Nicotine dependence, unspecified, uncomplicated: Secondary | ICD-10-CM

## 2017-06-24 DIAGNOSIS — K2100 Gastro-esophageal reflux disease with esophagitis, without bleeding: Secondary | ICD-10-CM

## 2017-06-24 DIAGNOSIS — G43909 Migraine, unspecified, not intractable, without status migrainosus: Secondary | ICD-10-CM

## 2017-06-24 DIAGNOSIS — F341 Dysthymic disorder: Secondary | ICD-10-CM

## 2017-06-24 DIAGNOSIS — I341 Nonrheumatic mitral (valve) prolapse: Secondary | ICD-10-CM

## 2017-06-24 MED ORDER — PRAMIPEXOLE DIHYDROCHLORIDE 1 MG PO TABS: 1.0000 mg | ORAL_TABLET | Freq: Every day | ORAL | 3 refills | Status: DC

## 2017-06-24 MED ORDER — VENLAFAXINE HCL ER 150 MG PO CP24: ORAL_CAPSULE | ORAL | 3 refills | Status: DC

## 2017-06-24 MED ORDER — LOSARTAN POTASSIUM-HCTZ 50-12.5 MG PO TABS: 1.0000 | ORAL_TABLET | Freq: Every day | ORAL | 3 refills | Status: DC

## 2017-06-24 MED ORDER — ESOMEPRAZOLE MAGNESIUM 40 MG PO CPDR: 40.0000 mg | DELAYED_RELEASE_CAPSULE | Freq: Two times a day (BID) | ORAL | 3 refills | Status: DC

## 2017-06-24 NOTE — Progress Notes (Signed)
   Subjective:    Patient ID: Cynthia [name], female    DOB: 1960/12/16, 56 y.o.   MRN: [id number]  HPI She is here for medication check. She is working but the job does not have insurance. She has found a pharmacy that gives discounts. Her reflux seems be under good control with Nexium however if she skips more than 2 days, she will feel it. She continues on Hyzaar without difficulty. She does have RLS and gets good results with Mirapex. She continues on Effexor and states that psychologically this does definitely help. She smokes but is not ready to quit. Recognizes the need to lose weight but has yet to be able to put forth much of an effort. She has not had any migraines recently.   Review of Systems     Objective:   Physical Exam Alert and in no distress. Tympanic membranes and canals are normal. Pharyngeal area is normal. Neck is supple without adenopathy or thyromegaly. Cardiac exam shows a regular sinus rhythm without murmurs or gallops. Lungs are clear to auscultation.        Assessment & Plan:  Current smoker  Essential hypertension - Plan: losartan-hydrochlorothiazide (HYZAAR) 50-12.5 MG tablet  Migraine without status migrainosus, not intractable, unspecified migraine type - Plan: venlafaxine XR (EFFEXOR-XR) 150 MG 24 hr capsule  MVP (mitral valve prolapse)  Obesity (BMI 30-39.9)  RLS (restless legs syndrome) - Plan: pramipexole (MIRAPEX) 1 MG tablet  Dysthymia - Plan: venlafaxine XR (EFFEXOR-XR) 150 MG 24 hr capsule  ESOPHAGITIS, REFLUX - Plan: esomeprazole (NEXIUM) 40 MG capsule Review of her lab work looks good. I see no need to repeat it today and she is comfortable with that. She will continue on all her present medications. Encouraged her to call when she is ready to quit and we will work with her concerning that.

## 2017-09-08 ENCOUNTER — Telehealth: Payer: Self-pay | Admitting: Family Medicine

## 2017-09-08 MED ORDER — AMOXICILLIN 875 MG PO TABS: 875.0000 mg | ORAL_TABLET | Freq: Two times a day (BID) | ORAL | 0 refills | Status: DC

## 2017-09-08 NOTE — Telephone Encounter (Signed)
Pt called and states that she has a sinus infection. Symptoms include headache and nasal green discharge. She is requesting something be sent in for her. Pt just started a new job and has no insurance. Please advise pt and sent to [facility]. [facility] [address]

## 2017-09-08 NOTE — Telephone Encounter (Signed)
Let her know that I called in an antibiotic

## 2017-09-09 NOTE — Telephone Encounter (Signed)
Pt aware. Cynthia [name]

## 2017-09-09 NOTE — Telephone Encounter (Signed)
LMTCB

## 2017-09-15 ENCOUNTER — Encounter: Payer: Self-pay | Admitting: Family Medicine

## 2017-09-15 ENCOUNTER — Ambulatory Visit (INDEPENDENT_AMBULATORY_CARE_PROVIDER_SITE_OTHER): Payer: Self-pay | Admitting: Family Medicine

## 2017-09-15 VITALS — BP 150/90 | HR 102 | Temp 101.5°F | Resp 16 | Wt 237.6 lb

## 2017-09-15 DIAGNOSIS — R509 Fever, unspecified: Secondary | ICD-10-CM

## 2017-09-15 DIAGNOSIS — F17200 Nicotine dependence, unspecified, uncomplicated: Secondary | ICD-10-CM

## 2017-09-15 DIAGNOSIS — R05 Cough: Secondary | ICD-10-CM

## 2017-09-15 DIAGNOSIS — R350 Frequency of micturition: Secondary | ICD-10-CM

## 2017-09-15 DIAGNOSIS — R059 Cough, unspecified: Secondary | ICD-10-CM

## 2017-09-15 DIAGNOSIS — J209 Acute bronchitis, unspecified: Secondary | ICD-10-CM

## 2017-09-15 DIAGNOSIS — J0110 Acute frontal sinusitis, unspecified: Secondary | ICD-10-CM

## 2017-09-15 DIAGNOSIS — R6883 Chills (without fever): Secondary | ICD-10-CM

## 2017-09-15 LAB — POCT URINALYSIS DIP (PROADVANTAGE DEVICE)
Bilirubin, UA: NEGATIVE
Blood, UA: NEGATIVE
Glucose, UA: NEGATIVE mg/dL
Ketones, POC UA: NEGATIVE mg/dL
Leukocytes, UA: NEGATIVE
Nitrite, UA: NEGATIVE
Specific Gravity, Urine: 1.02
Urobilinogen, Ur: NEGATIVE
pH, UA: 6 (ref 5.0–8.0)

## 2017-09-15 LAB — CBC WITH DIFFERENTIAL/PLATELET
Basophils Absolute: 23 cells/uL (ref 0–200)
Basophils Relative: 0.2 %
Eosinophils Absolute: 148 cells/uL (ref 15–500)
Eosinophils Relative: 1.3 %
HCT: 40.6 % (ref 35.0–45.0)
Hemoglobin: 14.1 g/dL (ref 11.7–15.5)
Lymphs Abs: 1277 cells/uL (ref 850–3900)
MCH: 29.1 pg (ref 27.0–33.0)
MCHC: 34.7 g/dL (ref 32.0–36.0)
MCV: 83.9 fL (ref 80.0–100.0)
MPV: 11.2 fL (ref 7.5–12.5)
Monocytes Relative: 5.8 %
Neutro Abs: 9291 cells/uL — ABNORMAL HIGH (ref 1500–7800)
Neutrophils Relative %: 81.5 %
Platelets: 262 10*3/uL (ref 140–400)
RBC: 4.84 10*6/uL (ref 3.80–5.10)
RDW: 12.4 % (ref 11.0–15.0)
Total Lymphocyte: 11.2 %
WBC mixed population: 661 cells/uL (ref 200–950)
WBC: 11.4 10*3/uL — ABNORMAL HIGH (ref 3.8–10.8)

## 2017-09-15 LAB — POC INFLUENZA A&B (BINAX/QUICKVUE)
Influenza A, POC: NEGATIVE
Influenza B, POC: NEGATIVE

## 2017-09-15 MED ORDER — AMOXICILLIN-POT CLAVULANATE 875-125 MG PO TABS: 1.0000 | ORAL_TABLET | Freq: Two times a day (BID) | ORAL | 0 refills | Status: DC

## 2017-09-15 MED ORDER — PREDNISONE 10 MG (21) PO TBPK: ORAL_TABLET | Freq: Every day | ORAL | 0 refills | Status: DC

## 2017-09-15 MED ORDER — ALBUTEROL SULFATE HFA 108 (90 BASE) MCG/ACT IN AERS: 2.0000 | INHALATION_SPRAY | Freq: Four times a day (QID) | RESPIRATORY_TRACT | 0 refills | Status: DC | PRN

## 2017-09-15 NOTE — Progress Notes (Signed)
Subjective:    Patient ID: Cynthia [name], female    DOB: [birth year], 56 y.o.   MRN: [id number]  HPI Chief Complaint  Patient presents with  . sick    sick- really bad cough- off and on for about a month, hurting in back, was on antibitoic last week and no better. fever started today.    She is here with complaints of fever and sinus pain that started today. States she has had cough x 1 month that seems to be getting worse. States she is on her second round of Amoxil for cough and bronchitis. Reports having chest tightness and upper back pain with cough.  Reports shortness of breath and wheezing intermittently. She has declined chest XR and does not want to get one today due to not having health insurance.  Denies history of pneumonia or asthma.  Reports history of bronchitis every 1-2 years.  She is a smoker.   Complains of urinary frequency and urgency for a week. Denies dysuria.    She started a new job today and had to leave work.   No BP medication for 3 weeks due to financial issues and loss of insurance. She is aware that her BP is elevated.   Denies dizziness, ear pain, sore throat, palpitations, abdominal pain, N/V/D, LE edema.   Reviewed allergies, medications, past medical, surgical, and social history.  Review of Systems Pertinent positives and negatives in the history of present illness.     Objective:   Physical Exam  Constitutional: She is oriented to person, place, and time. She appears well-developed and well-nourished. No distress.  HENT:  Right Ear: Tympanic membrane and ear canal normal.  Left Ear: Tympanic membrane and ear canal normal.  Nose: Mucosal edema present. Right sinus exhibits no frontal sinus tenderness. Left sinus exhibits no frontal sinus tenderness.  Mouth/Throat: Uvula is midline, oropharynx is clear and moist and mucous membranes are normal.  Eyes: Conjunctivae and lids are normal. Pupils are equal, round, and reactive to light.  Neck:  Full passive range of motion without pain. Neck supple.  Cardiovascular: Normal rate, regular rhythm, normal heart sounds and normal pulses.  Pulmonary/Chest: Effort normal. No accessory muscle usage. She has wheezes in the right upper field and the right middle field.  Speaking in full sentences without difficulty.  Mild expiratory wheezes   Lymphadenopathy:    She has no cervical adenopathy.       Right: No supraclavicular adenopathy present.       Left: No supraclavicular adenopathy present.  Neurological: She is alert and oriented to person, place, and time. She has normal strength. Gait normal.  Skin: Skin is warm and dry. No rash noted. No cyanosis. No pallor.  Psychiatric: She has a normal mood and affect. Her speech is normal and behavior is normal. Thought content normal.   BP (!) 150/90   Pulse (!) 102   Temp (!) 101.5 F (38.6 C) (Oral)   Resp 16   Wt 237 lb 9.6 oz (107.8 kg)   LMP 06/10/2011   SpO2 96%   BMI 40.78 kg/m      Assessment & Plan:  Acute non-recurrent frontal sinusitis - Plan: CBC with Differential/Platelet, amoxicillin-clavulanate (AUGMENTIN) 875-125 MG tablet, predniSONE (STERAPRED UNI-PAK 21 TAB) 10 MG (21) TBPK tablet  Fever, unspecified fever cause - Plan: POC Influenza A&B(BINAX/QUICKVUE), CBC with Differential/Platelet, amoxicillin-clavulanate (AUGMENTIN) 875-125 MG tablet, predniSONE (STERAPRED UNI-PAK 21 TAB) 10 MG (21) TBPK tablet  Chills - Plan: POC Influenza A&B(BINAX/QUICKVUE)  Cough - Plan: POC Influenza A&B(BINAX/QUICKVUE), albuterol (PROVENTIL HFA;VENTOLIN HFA) 108 (90 Base) MCG/ACT inhaler  Current smoker - Plan: albuterol (PROVENTIL HFA;VENTOLIN HFA) 108 (90 Base) MCG/ACT inhaler  Urinary frequency - Plan: POCT Urinalysis DIP (Proadvantage Device)  Acute bronchitis, unspecified organism - Plan: amoxicillin-clavulanate (AUGMENTIN) 875-125 MG tablet, predniSONE (STERAPRED UNI-PAK 21 TAB) 10 MG (21) TBPK tablet, albuterol (PROVENTIL  HFA;VENTOLIN HFA) 108 (90 Base) MCG/ACT inhaler  UA negative  Flu swab negative  CBC ordered.  Declines chest XR.  Symptoms suspicious for CAP however she refuses XR. She is non toxic appearing and is not in any acute distress. Will switch her antibiotic to Augmentin for better coverage and start her on a steroid dose pak. Albuterol inhaler also prescribed.  Smoking cessation done.  Will have her follow up with her PCP in 10-14 days or sooner if she worsens.

## 2017-10-13 ENCOUNTER — Other Ambulatory Visit: Payer: Self-pay | Admitting: Family Medicine

## 2017-10-13 DIAGNOSIS — F17200 Nicotine dependence, unspecified, uncomplicated: Secondary | ICD-10-CM

## 2017-10-13 DIAGNOSIS — J209 Acute bronchitis, unspecified: Secondary | ICD-10-CM

## 2017-10-13 DIAGNOSIS — R05 Cough: Secondary | ICD-10-CM

## 2017-10-13 DIAGNOSIS — R059 Cough, unspecified: Secondary | ICD-10-CM

## 2017-10-21 ENCOUNTER — Telehealth: Payer: Self-pay

## 2017-10-21 DIAGNOSIS — R059 Cough, unspecified: Secondary | ICD-10-CM

## 2017-10-21 DIAGNOSIS — F17200 Nicotine dependence, unspecified, uncomplicated: Secondary | ICD-10-CM

## 2017-10-21 DIAGNOSIS — R05 Cough: Secondary | ICD-10-CM

## 2017-10-21 DIAGNOSIS — J209 Acute bronchitis, unspecified: Secondary | ICD-10-CM

## 2017-10-21 MED ORDER — ALBUTEROL SULFATE HFA 108 (90 BASE) MCG/ACT IN AERS: 2.0000 | INHALATION_SPRAY | Freq: Four times a day (QID) | RESPIRATORY_TRACT | 0 refills | Status: DC | PRN

## 2017-10-21 NOTE — Telephone Encounter (Signed)
ok 

## 2017-10-21 NOTE — Telephone Encounter (Signed)
Faxed request rcvd for pt for Proventil to [facility] [address] [place]

## 2017-10-21 NOTE — Telephone Encounter (Signed)
Done

## 2018-04-27 ENCOUNTER — Encounter: Payer: Self-pay | Admitting: Family Medicine

## 2018-04-27 ENCOUNTER — Ambulatory Visit (INDEPENDENT_AMBULATORY_CARE_PROVIDER_SITE_OTHER): Payer: Self-pay | Admitting: Family Medicine

## 2018-04-27 VITALS — BP 120/82 | HR 93 | Temp 98.2°F | Ht 63.5 in | Wt 222.4 lb

## 2018-04-27 DIAGNOSIS — R350 Frequency of micturition: Secondary | ICD-10-CM

## 2018-04-27 DIAGNOSIS — M545 Low back pain, unspecified: Secondary | ICD-10-CM

## 2018-04-27 DIAGNOSIS — N3000 Acute cystitis without hematuria: Secondary | ICD-10-CM

## 2018-04-27 DIAGNOSIS — R102 Pelvic and perineal pain: Secondary | ICD-10-CM

## 2018-04-27 LAB — POCT URINALYSIS DIP (PROADVANTAGE DEVICE)
Bilirubin, UA: NEGATIVE
Glucose, UA: NEGATIVE mg/dL
Ketones, POC UA: NEGATIVE mg/dL
Nitrite, UA: POSITIVE — AB
Protein Ur, POC: 30 mg/dL — AB
pH, UA: 6 (ref 5.0–8.0)

## 2018-04-27 MED ORDER — SULFAMETHOXAZOLE-TRIMETHOPRIM 800-160 MG PO TABS: 1.0000 | ORAL_TABLET | Freq: Two times a day (BID) | ORAL | 0 refills | Status: DC

## 2018-04-27 NOTE — Patient Instructions (Addendum)
Take the antibiotic as prescribed.  Increase your water and fluids in general.  Your urine should be a light clear yellow when you are hydrated.  If you develop fever, worsening back pain or vomiting then you should be seen again.  Follow up on 2 weeks for a lab visit to recheck your urine.

## 2018-04-27 NOTE — Progress Notes (Signed)
Chief Complaint  Patient presents with  . Urinary Frequency    Lower back pain,cloudy urine, dark, urinary urgency    Subjective:  Cynthia [name] is a 57 y.o. female who complains of possible urinary tract infection.  She has had symptoms for 1 week.  Symptoms include low back pain, dark and cloudy urine, suprapubic pressure, urgency and frequency. Patient denies fever, chills, abdominal pain, N/V/D, vaginal discharge.  Last UTI was months.   Using ibuprofen for current symptoms.    Patient does not have a history of recurrent UTI. Patient does not have a history of pyelonephritis.  No other aggravating or relieving factors.  No other c/o.  Past Medical History:  Diagnosis Date  . Anemia 2012  . Anxiety   . Depression   . GERD (gastroesophageal reflux disease)   . Headache(784.0)    MIGRAINE  . Heart murmur   . Hiatal hernia   . Hypertension   . IBS (irritable bowel syndrome)   . Incontinence   . MVP (mitral valve prolapse)   . RLS (restless legs syndrome)     ROS as in subjective  Reviewed allergies, medications, past medical, surgical, and social history.    Objective: Vitals:   04/27/18 1414  BP: 120/82  Pulse: 93  Temp: 98.2 F (36.8 C)  SpO2: 97%    General appearance: alert, no distress, WD/WN, female Abdomen: +bs, soft, mild TTP suprapubic, non distended, no masses, no hepatomegaly, no splenomegaly, no bruits Back: no CVA tenderness GU: declines      Laboratory:  Urine dipstick: nit +, leuk+, dark and cloudy.       Assessment: Acute cystitis without hematuria - Plan: Urine Culture, sulfamethoxazole-trimethoprim (BACTRIM DS,SEPTRA DS) 800-160 MG tablet, POCT Urinalysis DIP (Proadvantage Device), DISCONTINUED: sulfamethoxazole-trimethoprim (BACTRIM DS,SEPTRA DS) 800-160 MG tablet  Urinary frequency - Plan: POCT Urinalysis DIP (Proadvantage Device), Urine Culture  Suprapubic pressure - Plan: Urine Culture  Acute bilateral low back pain without  sciatica - Plan: Urine Culture    Plan: Discussed symptoms, diagnosis, possible complications, and usual course of illness.  Begin Bactrim.  Advised increased water intake, can use OTC Tylenol for pain.    Advised to follow up if fever, vomiting or worsening pain.     Urine culture sent.   Return for lab visit for urinalysis dipstick in 2 weeks.

## 2018-04-29 ENCOUNTER — Telehealth: Payer: Self-pay | Admitting: Family Medicine

## 2018-04-29 ENCOUNTER — Other Ambulatory Visit: Payer: Self-pay | Admitting: Family Medicine

## 2018-04-29 LAB — URINE CULTURE

## 2018-04-29 MED ORDER — NITROFURANTOIN MONOHYD MACRO 100 MG PO CAPS: 100.0000 mg | ORAL_CAPSULE | Freq: Two times a day (BID) | ORAL | 0 refills | Status: DC

## 2018-04-29 NOTE — Telephone Encounter (Signed)
Pt needs antibiotic switched from [facility] to [facility].  Cancelled Mac[facility] at [facility]

## 2018-05-13 ENCOUNTER — Other Ambulatory Visit (INDEPENDENT_AMBULATORY_CARE_PROVIDER_SITE_OTHER): Payer: Self-pay

## 2018-05-13 DIAGNOSIS — N3000 Acute cystitis without hematuria: Secondary | ICD-10-CM

## 2018-05-13 LAB — POCT URINALYSIS DIP (PROADVANTAGE DEVICE)
Bilirubin, UA: NEGATIVE
Blood, UA: NEGATIVE
Glucose, UA: NEGATIVE mg/dL
Ketones, POC UA: NEGATIVE mg/dL
Leukocytes, UA: NEGATIVE
Nitrite, UA: NEGATIVE
Protein Ur, POC: NEGATIVE mg/dL
Specific Gravity, Urine: 1.025
Urobilinogen, Ur: NEGATIVE
pH, UA: 6 (ref 5.0–8.0)

## 2018-06-30 ENCOUNTER — Ambulatory Visit (INDEPENDENT_AMBULATORY_CARE_PROVIDER_SITE_OTHER): Payer: Self-pay | Admitting: Family Medicine

## 2018-06-30 ENCOUNTER — Encounter: Payer: Self-pay | Admitting: Family Medicine

## 2018-06-30 VITALS — BP 134/88 | HR 107 | Temp 98.3°F | Wt 220.2 lb

## 2018-06-30 DIAGNOSIS — K219 Gastro-esophageal reflux disease without esophagitis: Secondary | ICD-10-CM

## 2018-06-30 MED ORDER — ONDANSETRON HCL 4 MG PO TABS: 4.0000 mg | ORAL_TABLET | Freq: Three times a day (TID) | ORAL | 0 refills | Status: DC | PRN

## 2018-06-30 NOTE — Progress Notes (Signed)
   Subjective:    Patient ID: Cynthia [name], female    DOB: [birth year], 59 y.o.   MRN: [id number]  HPI He started having difficulty with indigestion, midepigastric pain and vomiting yesterday.  She had a previous episode of this 2 months ago.  These are similar symptoms to her reflux.  She was on Nexium but has not been taking this regularly.   Review of Systems     Objective:   Physical Exam Alert and complaining of abdominal pain.  Cardiac exam shows regular rhythm without murmurs or gallops.  Lungs clear to auscultation.  Abdominal exam shows active bowel sounds with some slight midepigastric tenderness.       Assessment & Plan:  Gastroesophageal reflux disease, esophagitis presence not specified - Plan: ondansetron (ZOFRAN) 4 MG tablet Also recommend she start back on the Nexium again try to use it on an as-needed basis and pay attention to when she does indeed have nausea and abdominal distress.

## 2018-07-01 ENCOUNTER — Other Ambulatory Visit: Payer: Self-pay | Admitting: Family Medicine

## 2018-07-01 DIAGNOSIS — K2100 Gastro-esophageal reflux disease with esophagitis, without bleeding: Secondary | ICD-10-CM

## 2018-09-08 ENCOUNTER — Other Ambulatory Visit: Payer: Self-pay | Admitting: Family Medicine

## 2018-09-08 DIAGNOSIS — G43909 Migraine, unspecified, not intractable, without status migrainosus: Secondary | ICD-10-CM

## 2018-09-08 DIAGNOSIS — I10 Essential (primary) hypertension: Secondary | ICD-10-CM

## 2018-09-08 DIAGNOSIS — F341 Dysthymic disorder: Secondary | ICD-10-CM

## 2018-09-08 DIAGNOSIS — G2581 Restless legs syndrome: Secondary | ICD-10-CM

## 2018-09-08 NOTE — Telephone Encounter (Signed)
[facility] is requesting to fill pt Venafaxine XR and mirapex. Pt is over due for CPE and hypertension eval. Sent in 30 days of losartan and called pt please advise if these med can be sent in for 30 days. [place]

## 2018-09-08 NOTE — Telephone Encounter (Signed)
Pt called and is completely out of mirapex. Please refill all meds as soon as possible.

## 2018-09-08 NOTE — Telephone Encounter (Signed)
Time for an office visit

## 2018-10-04 ENCOUNTER — Ambulatory Visit (INDEPENDENT_AMBULATORY_CARE_PROVIDER_SITE_OTHER): Payer: Self-pay | Admitting: Family Medicine

## 2018-10-04 ENCOUNTER — Encounter: Payer: Self-pay | Admitting: Family Medicine

## 2018-10-04 VITALS — BP 130/88 | HR 77 | Temp 97.8°F | Wt 219.2 lb

## 2018-10-04 DIAGNOSIS — G43909 Migraine, unspecified, not intractable, without status migrainosus: Secondary | ICD-10-CM

## 2018-10-04 DIAGNOSIS — F341 Dysthymic disorder: Secondary | ICD-10-CM

## 2018-10-04 DIAGNOSIS — I10 Essential (primary) hypertension: Secondary | ICD-10-CM

## 2018-10-04 DIAGNOSIS — K219 Gastro-esophageal reflux disease without esophagitis: Secondary | ICD-10-CM

## 2018-10-04 DIAGNOSIS — G2581 Restless legs syndrome: Secondary | ICD-10-CM

## 2018-10-04 MED ORDER — ESOMEPRAZOLE MAGNESIUM 40 MG PO CPDR: DELAYED_RELEASE_CAPSULE | ORAL | 3 refills | Status: DC

## 2018-10-04 MED ORDER — VENLAFAXINE HCL ER 150 MG PO CP24: ORAL_CAPSULE | ORAL | 0 refills | Status: DC

## 2018-10-04 MED ORDER — PRAMIPEXOLE DIHYDROCHLORIDE 1 MG PO TABS: ORAL_TABLET | ORAL | 1 refills | Status: DC

## 2018-10-04 MED ORDER — LOSARTAN POTASSIUM-HCTZ 50-12.5 MG PO TABS: ORAL_TABLET | ORAL | 0 refills | Status: DC

## 2018-10-04 NOTE — Progress Notes (Signed)
   Subjective:    Patient ID: Cynthia [name], female    DOB: Jan 15, 1961, 57 y.o.   MRN: [id number]  HPI She is here for medication check visit.  She continues on Nexium but states that sometimes she does have abdominal pain but seems to relate this to stress that she is under.  She is under stress recently dealing with a gentleman is living with her that apparently is not helping and causing a great deal of stress.  Apparently he is supposed to move out 1 January.  She continues to do well on her losartan.  Mirapex continues to work well for her restless legs.  Has not had difficulty with migraine headaches in quite some time.     Review of Systems     Objective:   Physical Exam        Assessment & Plan:  Gastroesophageal reflux disease, esophagitis presence not specified - Plan: esomeprazole (NEXIUM) 40 MG capsule  RLS (restless legs syndrome) - Plan: pramipexole (MIRAPEX) 1 MG tablet  Migraine without status migrainosus, not intractable, unspecified migraine type - Plan: venlafaxine XR (EFFEXOR-XR) 150 MG 24 hr capsule  Essential hypertension - Plan: losartan-hydrochlorothiazide (HYZAAR) 50-12.5 MG tablet  Dysthymia - Plan: venlafaxine XR (EFFEXOR-XR) 150 MG 24 hr capsule  We discussed the fact that stress plays a major role in her GI complaints including the nausea that she occasionally has.  Strongly encouraged her to maintain effective this gentleman needs to get out of the house.  We will continue her on her present medication regimen.  Hopefully in the near future she will get back on insurance.

## 2018-10-18 ENCOUNTER — Telehealth: Payer: Self-pay | Admitting: Family Medicine

## 2018-10-18 DIAGNOSIS — G43909 Migraine, unspecified, not intractable, without status migrainosus: Secondary | ICD-10-CM

## 2018-10-18 DIAGNOSIS — G2581 Restless legs syndrome: Secondary | ICD-10-CM

## 2018-10-18 DIAGNOSIS — I10 Essential (primary) hypertension: Secondary | ICD-10-CM

## 2018-10-18 DIAGNOSIS — K219 Gastro-esophageal reflux disease without esophagitis: Secondary | ICD-10-CM

## 2018-10-18 DIAGNOSIS — F341 Dysthymic disorder: Secondary | ICD-10-CM

## 2018-10-18 MED ORDER — LOSARTAN POTASSIUM-HCTZ 50-12.5 MG PO TABS: ORAL_TABLET | ORAL | 3 refills | Status: DC

## 2018-10-18 MED ORDER — ESOMEPRAZOLE MAGNESIUM 40 MG PO CPDR: DELAYED_RELEASE_CAPSULE | ORAL | 3 refills | Status: DC

## 2018-10-18 MED ORDER — PRAMIPEXOLE DIHYDROCHLORIDE 1 MG PO TABS: ORAL_TABLET | ORAL | 3 refills | Status: DC

## 2018-10-18 MED ORDER — VENLAFAXINE HCL ER 150 MG PO CP24: ORAL_CAPSULE | ORAL | 3 refills | Status: DC

## 2018-10-18 NOTE — Telephone Encounter (Signed)
Pt states her meds were supposed to be for 90 days each and with refills for year.  Michela Pitcher is a lot cheaper with 90 days.  Please refill the following all with 90 each to [facility] pharmacy Nexium, Hyzaar, Mirapex & Effexor.

## 2019-02-09 ENCOUNTER — Telehealth: Payer: Self-pay | Admitting: Family Medicine

## 2019-02-09 ENCOUNTER — Other Ambulatory Visit: Payer: Self-pay

## 2019-02-09 MED ORDER — HYDROCHLOROTHIAZIDE 12.5 MG PO CAPS: 12.5000 mg | ORAL_CAPSULE | Freq: Every day | ORAL | 0 refills | Status: DC

## 2019-02-09 MED ORDER — LOSARTAN POTASSIUM 100 MG PO TABS: 100.0000 mg | ORAL_TABLET | Freq: Every day | ORAL | 0 refills | Status: DC

## 2019-02-09 MED FILL — LOSARTAN POTASSIUM 100 MG T: 100 | 90 days supply | Qty: 45 | Fill #0

## 2019-02-09 MED FILL — HYDROCHLOROTHIAZIDE 12.5 MG: 12.5 | 90 days supply | Qty: 90 | Fill #0

## 2019-02-09 MED FILL — VENLAFAXINE HCL ER 150 MG C: 150 | 90 days supply | Qty: 180 | Fill #0

## 2019-02-09 MED FILL — ESOMEPRAZOLE MAG DR 40 MG C: 40 | 90 days supply | Qty: 180 | Fill #0

## 2019-02-09 MED FILL — PRAMIPEXOLE 1 MG TABLET: 1 | 90 days supply | Qty: 90 | Fill #0

## 2019-02-09 NOTE — Telephone Encounter (Signed)
[facility] pt called and states that Hyzaar is no longer available and meds must be split into 2 different meds. ALSO Losartan 50 mg is no longer available. Per instructions from pt's new pharmacy will need to get Losartan 100 mg and then split in two. Please send Losartan 100 mg and HCTZ 12.5 to NEW PHARMACY which is [facility].

## 2019-02-09 NOTE — Telephone Encounter (Signed)
Pharmacy called to clarify losartan 100 mg directions It has directions for 1 tablet daily and also for 1/2 tablet daily  Spoke to pt and verified that it should be 1/2  100mg  tablet because they don't have 50mg 

## 2019-02-10 NOTE — Telephone Encounter (Signed)
Called pharmacy and let them know it was supposed to be 1/2 tablet daily, they had figured it out.

## 2019-06-20 ENCOUNTER — Telehealth: Payer: Self-pay | Admitting: Family Medicine

## 2019-06-20 NOTE — Telephone Encounter (Signed)
Pt waiting on mail order for Venlafaxine so I called in #30 to [facility] with [facility] $15 for cash price Good Rx ID [id number] BIN [id number] PCN [id number] Group [id number]

## 2019-06-24 NOTE — Telephone Encounter (Signed)
done

## 2019-08-04 DIAGNOSIS — D126 Benign neoplasm of colon, unspecified: Secondary | ICD-10-CM

## 2019-08-04 HISTORY — DX: Benign neoplasm of colon, unspecified: D12.6

## 2019-08-09 ENCOUNTER — Other Ambulatory Visit: Payer: Self-pay

## 2019-08-09 DIAGNOSIS — Z20822 Contact with and (suspected) exposure to covid-19: Secondary | ICD-10-CM

## 2019-08-11 LAB — NOVEL CORONAVIRUS, NAA: SARS-CoV-2, NAA: NOT DETECTED

## 2019-08-15 ENCOUNTER — Other Ambulatory Visit: Payer: Self-pay

## 2019-08-15 ENCOUNTER — Ambulatory Visit (INDEPENDENT_AMBULATORY_CARE_PROVIDER_SITE_OTHER): Payer: Self-pay | Admitting: Family Medicine

## 2019-08-15 VITALS — Wt 192.0 lb

## 2019-08-15 DIAGNOSIS — J209 Acute bronchitis, unspecified: Secondary | ICD-10-CM

## 2019-08-15 MED ORDER — AMOXICILLIN 875 MG PO TABS: 875.0000 mg | ORAL_TABLET | Freq: Two times a day (BID) | ORAL | 0 refills | Status: DC

## 2019-08-15 MED FILL — AMOXICILLIN 875 MG TABS: 875 | 10 days supply | Qty: 20 | Fill #0

## 2019-08-15 NOTE — Progress Notes (Signed)
   Subjective:    Patient ID: Cynthia [name], female    DOB: [birth year], 58 y.o.   MRN: [id number]  HPI Documentation for virtual telephone encounter.  Documentation for virtual audio and video telecommunications through [place] encounter: The patient was located at home. The provider was located in the office. The patient did consent to this visit and is aware of possible charges through their insurance for this visit.  The other persons participating in this telemedicine service were none. Time spent on call was 2 minutes and in review of previous records >10 minutes total. This virtual service is not related to other E/M service within previous 7 days. She has a 10-day history of started with cough, headache, decreased appetite, fever and chills.  She was tested 6 days ago for COVID and was negative.  She continues to have difficulty with intermittently productive cough, decreased appetite, fatigue, malaise.  She states that she is now only 20% better.   Review of Systems     Objective:   Physical Exam Alert and in no acute distress.  Breathing pattern appears normal      Assessment & Plan:  Acute bronchitis, unspecified organism - Plan: amoxicillin (AMOXIL) 875 MG tablet I think she initially had a viral syndrome and now has a secondary bacterial infection.  I will give her Amoxil.  She is also to use pain meds as needed as well as cough suppressants.  She will call if further difficulty.

## 2019-08-25 ENCOUNTER — Encounter (HOSPITAL_COMMUNITY): Payer: Self-pay | Admitting: Emergency Medicine

## 2019-08-25 ENCOUNTER — Other Ambulatory Visit: Payer: Self-pay

## 2019-08-25 ENCOUNTER — Inpatient Hospital Stay (HOSPITAL_COMMUNITY)
Admission: EM | Admit: 2019-08-25 | Discharge: 2019-08-29 | DRG: 389 | Disposition: A | Discharge status: Home / Self Care | Payer: Self-pay | Attending: Internal Medicine | Admitting: Internal Medicine

## 2019-08-25 DIAGNOSIS — K529 Noninfective gastroenteritis and colitis, unspecified: Secondary | ICD-10-CM | POA: Diagnosis present

## 2019-08-25 DIAGNOSIS — Z23 Encounter for immunization: Secondary | ICD-10-CM

## 2019-08-25 DIAGNOSIS — K5090 Crohn's disease, unspecified, without complications: Secondary | ICD-10-CM | POA: Diagnosis present

## 2019-08-25 DIAGNOSIS — I10 Essential (primary) hypertension: Secondary | ICD-10-CM | POA: Diagnosis present

## 2019-08-25 DIAGNOSIS — K56609 Unspecified intestinal obstruction, unspecified as to partial versus complete obstruction: Secondary | ICD-10-CM | POA: Diagnosis present

## 2019-08-25 DIAGNOSIS — F17210 Nicotine dependence, cigarettes, uncomplicated: Secondary | ICD-10-CM | POA: Diagnosis present

## 2019-08-25 DIAGNOSIS — E876 Hypokalemia: Secondary | ICD-10-CM | POA: Diagnosis present

## 2019-08-25 DIAGNOSIS — Z0189 Encounter for other specified special examinations: Secondary | ICD-10-CM

## 2019-08-25 DIAGNOSIS — E669 Obesity, unspecified: Secondary | ICD-10-CM | POA: Diagnosis present

## 2019-08-25 DIAGNOSIS — K449 Diaphragmatic hernia without obstruction or gangrene: Secondary | ICD-10-CM | POA: Diagnosis present

## 2019-08-25 DIAGNOSIS — Z79899 Other long term (current) drug therapy: Secondary | ICD-10-CM

## 2019-08-25 DIAGNOSIS — I160 Hypertensive urgency: Secondary | ICD-10-CM | POA: Diagnosis present

## 2019-08-25 DIAGNOSIS — K219 Gastro-esophageal reflux disease without esophagitis: Secondary | ICD-10-CM | POA: Diagnosis present

## 2019-08-25 DIAGNOSIS — G2581 Restless legs syndrome: Secondary | ICD-10-CM | POA: Diagnosis present

## 2019-08-25 DIAGNOSIS — F329 Major depressive disorder, single episode, unspecified: Secondary | ICD-10-CM | POA: Diagnosis present

## 2019-08-25 DIAGNOSIS — F419 Anxiety disorder, unspecified: Secondary | ICD-10-CM | POA: Diagnosis present

## 2019-08-25 DIAGNOSIS — Z6832 Body mass index (BMI) 32.0-32.9, adult: Secondary | ICD-10-CM

## 2019-08-25 DIAGNOSIS — Z20828 Contact with and (suspected) exposure to other viral communicable diseases: Secondary | ICD-10-CM | POA: Diagnosis present

## 2019-08-25 DIAGNOSIS — Z888 Allergy status to other drugs, medicaments and biological substances status: Secondary | ICD-10-CM

## 2019-08-25 DIAGNOSIS — K621 Rectal polyp: Secondary | ICD-10-CM

## 2019-08-25 DIAGNOSIS — I341 Nonrheumatic mitral (valve) prolapse: Secondary | ICD-10-CM | POA: Diagnosis present

## 2019-08-25 DIAGNOSIS — Z560 Unemployment, unspecified: Secondary | ICD-10-CM

## 2019-08-25 DIAGNOSIS — Z8379 Family history of other diseases of the digestive system: Secondary | ICD-10-CM

## 2019-08-25 DIAGNOSIS — K56600 Partial intestinal obstruction, unspecified as to cause: Principal | ICD-10-CM | POA: Diagnosis present

## 2019-08-25 HISTORY — DX: Bronchitis, not specified as acute or chronic: J40

## 2019-08-25 LAB — URINALYSIS, ROUTINE W REFLEX MICROSCOPIC
Bilirubin Urine: NEGATIVE
Glucose, UA: NEGATIVE mg/dL
Hgb urine dipstick: NEGATIVE
Ketones, ur: 20 mg/dL — AB
Leukocytes,Ua: NEGATIVE
Nitrite: NEGATIVE
Protein, ur: 100 mg/dL — AB
Specific Gravity, Urine: 1.027 (ref 1.005–1.030)
pH: 5 (ref 5.0–8.0)

## 2019-08-25 LAB — COMPREHENSIVE METABOLIC PANEL
ALT: 15 U/L (ref 0–44)
AST: 15 U/L (ref 15–41)
Albumin: 3.1 g/dL — ABNORMAL LOW (ref 3.5–5.0)
Alkaline Phosphatase: 75 U/L (ref 38–126)
Anion gap: 12 (ref 5–15)
BUN: 7 mg/dL (ref 6–20)
CO2: 26 mmol/L (ref 22–32)
Calcium: 9.5 mg/dL (ref 8.9–10.3)
Chloride: 99 mmol/L (ref 98–111)
Creatinine, Ser: 0.84 mg/dL (ref 0.44–1.00)
GFR calc Af Amer: >60 mL/min (ref >60)
GFR calc non Af Amer: >60 mL/min (ref >60)
Glucose, Bld: 90 mg/dL (ref 70–99)
Potassium: 3.2 mmol/L — ABNORMAL LOW (ref 3.5–5.1)
Sodium: 137 mmol/L (ref 135–145)
Total Bilirubin: 0.8 mg/dL (ref 0.3–1.2)
Total Protein: 7.3 g/dL (ref 6.5–8.1)

## 2019-08-25 LAB — I-STAT BETA HCG BLOOD, ED (MC, WL, AP ONLY): I-stat hCG, quantitative: 6.6 m[IU]/mL — ABNORMAL HIGH (ref <5)

## 2019-08-25 LAB — CBC
HCT: 45.3 % (ref 36.0–46.0)
Hemoglobin: 14.9 g/dL (ref 12.0–15.0)
MCH: 28.1 pg (ref 26.0–34.0)
MCHC: 32.9 g/dL (ref 30.0–36.0)
MCV: 85.5 fL (ref 80.0–100.0)
Platelets: 328 10*3/uL (ref 150–400)
RBC: 5.3 MIL/uL — ABNORMAL HIGH (ref 3.87–5.11)
RDW: 12.9 % (ref 11.5–15.5)
WBC: 10.4 10*3/uL (ref 4.0–10.5)
nRBC: 0 % (ref 0.0–0.2)

## 2019-08-25 LAB — LIPASE, BLOOD: Lipase: 21 U/L (ref 11–51)

## 2019-08-25 MED ORDER — SODIUM CHLORIDE 0.9% FLUSH: 3.0000 mL | Freq: Once | INTRAVENOUS | Status: DC

## 2019-08-25 NOTE — ED Notes (Signed)
Delay explained.  Gave pt urine cup

## 2019-08-25 NOTE — ED Triage Notes (Signed)
Pt reports epigastric pain that has been going on for 5 days.

## 2019-08-26 ENCOUNTER — Encounter (HOSPITAL_COMMUNITY): Payer: Self-pay | Admitting: Radiology

## 2019-08-26 ENCOUNTER — Inpatient Hospital Stay (HOSPITAL_COMMUNITY): Payer: Self-pay

## 2019-08-26 ENCOUNTER — Emergency Department (HOSPITAL_COMMUNITY): Payer: Self-pay

## 2019-08-26 DIAGNOSIS — I10 Essential (primary) hypertension: Secondary | ICD-10-CM

## 2019-08-26 DIAGNOSIS — R933 Abnormal findings on diagnostic imaging of other parts of digestive tract: Secondary | ICD-10-CM

## 2019-08-26 DIAGNOSIS — K56609 Unspecified intestinal obstruction, unspecified as to partial versus complete obstruction: Secondary | ICD-10-CM

## 2019-08-26 LAB — CBG MONITORING, ED
Glucose-Capillary: 109 mg/dL — ABNORMAL HIGH (ref 70–99)
Glucose-Capillary: 125 mg/dL — ABNORMAL HIGH (ref 70–99)

## 2019-08-26 LAB — GLUCOSE, CAPILLARY: Glucose-Capillary: 187 mg/dL — ABNORMAL HIGH (ref 70–99)

## 2019-08-26 LAB — BASIC METABOLIC PANEL
Anion gap: 9 (ref 5–15)
BUN: 6 mg/dL (ref 6–20)
CO2: 26 mmol/L (ref 22–32)
Calcium: 8.5 mg/dL — ABNORMAL LOW (ref 8.9–10.3)
Chloride: 101 mmol/L (ref 98–111)
Creatinine, Ser: 0.9 mg/dL (ref 0.44–1.00)
GFR calc Af Amer: >60 mL/min (ref >60)
GFR calc non Af Amer: >60 mL/min (ref >60)
Glucose, Bld: 107 mg/dL — ABNORMAL HIGH (ref 70–99)
Potassium: 3 mmol/L — ABNORMAL LOW (ref 3.5–5.1)
Sodium: 136 mmol/L (ref 135–145)

## 2019-08-26 LAB — HIV ANTIBODY (ROUTINE TESTING W REFLEX): HIV Screen 4th Generation wRfx: NONREACTIVE

## 2019-08-26 LAB — C-REACTIVE PROTEIN: CRP: 6.3 mg/dL — ABNORMAL HIGH (ref <1.0)

## 2019-08-26 LAB — SEDIMENTATION RATE: Sed Rate: 47 mm/hr — ABNORMAL HIGH (ref 0–22)

## 2019-08-26 LAB — MAGNESIUM: Magnesium: 1.8 mg/dL (ref 1.7–2.4)

## 2019-08-26 LAB — SARS CORONAVIRUS 2 (TAT 6-24 HRS): SARS Coronavirus 2: NEGATIVE

## 2019-08-26 MED ORDER — CIPROFLOXACIN IN D5W 400 MG/200ML IV SOLN
400.0000 mg | Freq: Two times a day (BID) | INTRAVENOUS | Status: DC
  Administered 2019-08-26 – 2019-08-27 (×3): 400 mg via INTRAVENOUS
  Filled 2019-08-26 (×4): qty 200

## 2019-08-26 MED ORDER — ACETAMINOPHEN 650 MG RE SUPP: 650.0000 mg | Freq: Four times a day (QID) | RECTAL | Status: DC | PRN

## 2019-08-26 MED ORDER — POTASSIUM CHLORIDE 10 MEQ/100ML IV SOLN
10.0000 meq | INTRAVENOUS | Status: AC
  Administered 2019-08-26 (×2): 10 meq via INTRAVENOUS
  Filled 2019-08-26 (×2): qty 100

## 2019-08-26 MED ORDER — ALBUTEROL SULFATE (2.5 MG/3ML) 0.083% IN NEBU: 3.0000 mL | INHALATION_SOLUTION | Freq: Four times a day (QID) | RESPIRATORY_TRACT | Status: DC | PRN

## 2019-08-26 MED ORDER — MORPHINE SULFATE (PF) 2 MG/ML IV SOLN
2.0000 mg | INTRAVENOUS | Status: DC | PRN
  Administered 2019-08-26 – 2019-08-29 (×5): 2 mg via INTRAVENOUS
  Filled 2019-08-26 (×5): qty 1

## 2019-08-26 MED ORDER — LIDOCAINE VISCOUS HCL 2 % MT SOLN
15.0000 mL | Freq: Once | OROMUCOSAL | Status: AC
  Administered 2019-08-26: 02:00:00 15 mL via ORAL
  Filled 2019-08-26: qty 15

## 2019-08-26 MED ORDER — METRONIDAZOLE IN NACL 5-0.79 MG/ML-% IV SOLN
500.0000 mg | Freq: Once | INTRAVENOUS | Status: AC
  Administered 2019-08-26: 500 mg via INTRAVENOUS
  Filled 2019-08-26: qty 100

## 2019-08-26 MED ORDER — ALUM & MAG HYDROXIDE-SIMETH 200-200-20 MG/5ML PO SUSP
30.0000 mL | Freq: Once | ORAL | Status: AC
  Administered 2019-08-26: 30 mL via ORAL
  Filled 2019-08-26: qty 30

## 2019-08-26 MED ORDER — DEXTROSE-NACL 5-0.9 % IV SOLN
INTRAVENOUS | Status: DC
  Administered 2019-08-26 – 2019-08-27 (×2): via INTRAVENOUS

## 2019-08-26 MED ORDER — HYDRALAZINE HCL 20 MG/ML IJ SOLN
10.0000 mg | INTRAMUSCULAR | Status: DC | PRN
  Administered 2019-08-26 – 2019-08-29 (×7): 10 mg via INTRAVENOUS
  Filled 2019-08-26 (×7): qty 1

## 2019-08-26 MED ORDER — SODIUM CHLORIDE 0.9 % IV BOLUS (SEPSIS)
1000.0000 mL | Freq: Once | INTRAVENOUS | Status: AC
  Administered 2019-08-26: 1000 mL via INTRAVENOUS

## 2019-08-26 MED ORDER — ONDANSETRON HCL 4 MG PO TABS: 4.0000 mg | ORAL_TABLET | Freq: Four times a day (QID) | ORAL | Status: DC | PRN

## 2019-08-26 MED ORDER — METHYLPREDNISOLONE SODIUM SUCC 125 MG IJ SOLR
INTRAMUSCULAR | Status: AC
  Administered 2019-08-26: 125 mg
  Filled 2019-08-26: qty 2

## 2019-08-26 MED ORDER — POTASSIUM CHLORIDE CRYS ER 20 MEQ PO TBCR
40.0000 meq | EXTENDED_RELEASE_TABLET | Freq: Once | ORAL | Status: AC
  Administered 2019-08-26: 40 meq via ORAL
  Filled 2019-08-26: qty 2

## 2019-08-26 MED ORDER — ONDANSETRON HCL 4 MG/2ML IJ SOLN
4.0000 mg | Freq: Once | INTRAMUSCULAR | Status: AC
  Administered 2019-08-26: 4 mg via INTRAVENOUS
  Filled 2019-08-26: qty 2

## 2019-08-26 MED ORDER — PANTOPRAZOLE SODIUM 40 MG IV SOLR
40.0000 mg | Freq: Once | INTRAVENOUS | Status: AC
  Administered 2019-08-26: 40 mg via INTRAVENOUS
  Filled 2019-08-26: qty 40

## 2019-08-26 MED ORDER — ONDANSETRON HCL 4 MG/2ML IJ SOLN
4.0000 mg | Freq: Four times a day (QID) | INTRAMUSCULAR | Status: DC | PRN
  Administered 2019-08-26 – 2019-08-28 (×3): 4 mg via INTRAVENOUS
  Filled 2019-08-26 (×3): qty 2

## 2019-08-26 MED ORDER — METHYLPREDNISOLONE SODIUM SUCC 40 MG IJ SOLR
40.0000 mg | Freq: Every day | INTRAMUSCULAR | Status: DC
  Administered 2019-08-27 – 2019-08-28 (×2): 40 mg via INTRAVENOUS
  Filled 2019-08-26 (×2): qty 1

## 2019-08-26 MED ORDER — METRONIDAZOLE IN NACL 5-0.79 MG/ML-% IV SOLN
500.0000 mg | Freq: Three times a day (TID) | INTRAVENOUS | Status: DC
  Administered 2019-08-26 – 2019-08-28 (×6): 500 mg via INTRAVENOUS
  Filled 2019-08-26 (×7): qty 100

## 2019-08-26 MED ORDER — ACETAMINOPHEN 325 MG PO TABS
650.0000 mg | ORAL_TABLET | Freq: Four times a day (QID) | ORAL | Status: DC | PRN
  Administered 2019-08-29: 650 mg via ORAL
  Filled 2019-08-26: qty 2

## 2019-08-26 MED ORDER — CIPROFLOXACIN IN D5W 400 MG/200ML IV SOLN
400.0000 mg | Freq: Once | INTRAVENOUS | Status: AC
  Administered 2019-08-26: 07:00:00 400 mg via INTRAVENOUS
  Filled 2019-08-26: qty 200

## 2019-08-26 MED ORDER — IOHEXOL 300 MG/ML  SOLN
100.0000 mL | Freq: Once | INTRAMUSCULAR | Status: AC | PRN
  Administered 2019-08-26: 100 mL via INTRAVENOUS

## 2019-08-26 MED ORDER — PHENOL 1.4 % MT LIQD
1.0000 | OROMUCOSAL | Status: DC | PRN
  Administered 2019-08-26: 1 via OROMUCOSAL
  Filled 2019-08-26: qty 177

## 2019-08-26 NOTE — Consult Note (Addendum)
[facility] Consult: 10:42 AM 08/26/2019  LOS: 0 days    Referring Provider: Dr Cynthia [name]  Primary Care Physician:  Cynthia Lung, MD Primary Gastroenterologist:  Dr. Fuller [name]     Reason for Consultation: Abdominal pain, SBO, inflammation, stricturing in terminal ileum   HPI: Cynthia [name] is a 58 y.o. female.  PMH anxiety/depression.  Restless leg syndrome.  Mitral valve prolapse.  IBS.  GERD.  Anemia. Previous surgeries include laparoscopic abdominal hysterectomy/ SPO.  Bladder suspension.  Knee arthroscopy.  Bladder suspension, cystoscopy, cystocele repair 2013. 05/2002 EGD for epigastric abdominal pain, reflux symptoms.  Hiatal hernia, mild reflux esophagitis.   04/2008 colonoscopy.  To evaluate diarrhea and abdominal pain.  Mildly tortuous but otherwise normal colon.  07/2011 colonoscopy.  For evaluation of abnormal appearing cecum at hysterectomy.  5 mm, sessile polyp (serrated adenoma) removed from the neck flexure, otherwise normal study Patient was sent recall colonoscopy letter 05/2016.    For about 6 or more months patient has been having spells of central abdominal pain, bilious nausea and vomiting, anorexia lasting about 4 to 5 days.  Spells occur every 6 to 12 weeks.  Problem discussed on office visit with Dr. Redmond [name] 07/01/2019.  He prescribed prn Zofran and suggested resuming prn Nexium.   Dr. Redmond [name] Rxd Amoxicillin 08/15/2023 for secondary bacterial respiratory infection following viral syndrome.  Patient c/o 10-day history of productive cough, anorexia, headaches, fever, chills.  Covid 19 testing negative 10/6.  For about a week now she has had recurrence of the upper abdominal pain, bilious emesis, anorexia.  She is lost about 40 pounds over the last 6 months (99.9 kg 07/01/19, 85.3 kg now).  She is  now having small volume, nonbloody diarrheal stools.  Pretty much when she sits on the commode she will have a little bit of diarrhea.  She feels unwell generally.  No fevers or chills.  Has not vomited or passed any blood.  Came to the ED for evaluation.  CTAP w contrast: Several, focal segments of inflammation and narrowing/stricturing in the distal and terminal ileum. ?  Inflammatory and/or infectious colitis?  Mild, proximal PSBO to the level of distal ileal loops.  Reactive lymph nodes in right lower quadrant. KUB.  Post placement of NG tube shows scattered air-filled loops of small bowel in upper abdomen, good positioning of NG tube in the gastric body Labs remarkable for hypokalemia.  LFTs normal other than albumin low at 3.1. WBCs 11.4>> 0.4.  No anemia. I-STAT hCG quantitative 6.6 (RR < 5).    NG tube placed but has only put out less than 100 cc of clear, tan-colored bile over several hours.  Between the NG tube and doses of morphine, she is feeling better.  Multiple social stressors.  Is the primary caretaker for her demented 75 year old mother and 27 year old father who live about a mile and a half from her but she is at their house all the time.  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  After the wife died of Covid during the springtime, the husband  cut her hours back and discontinued her health insurance and has been very difficult to deal with so she finally quit 2 days ago.   Smokes 10 cigarettes a day but has not smoked for 5 days.  No alcohol. One of her sisters (4 years younger than Cynthia [name]) dx w Crohn's disease a year ago, sounds like uncomplicated dz. So age of sister's dx ~ 41 yo.     Past Medical History:  Diagnosis Date  . Anemia 2012  . Anxiety   . Bronchitis   . Depression   . GERD (gastroesophageal reflux disease)   . Headache(784.0)    MIGRAINE  . Heart murmur   . Hiatal hernia   . Hypertension   . IBS (irritable bowel syndrome)   . Incontinence   .  MVP (mitral valve prolapse)   . RLS (restless legs syndrome)     Past Surgical History:  Procedure Laterality Date  . ABDOMINAL HYSTERECTOMY  06/17/2011   Procedure: HYSTERECTOMY ABDOMINAL;  Surgeon: Arloa Koh;  Location: [facility];  Service: Gynecology;  Laterality: N/A;  Converted to Abdominal Hysterectomy with lysis of adhesions   . ANTERIOR AND POSTERIOR REPAIR  02/25/2012   Procedure: ANTERIOR (CYSTOCELE) AND POSTERIOR REPAIR (RECTOCELE);  Surgeon: Daria Pastures, MD;  Location: [facility];  Service: Gynecology;  Laterality: N/A;  ANterior cystocele repair ONLY  . BLADDER SUSPENSION  02/25/2012   Procedure: TRANSVAGINAL TAPE (TVT) PROCEDURE;  Surgeon: Daria Pastures, MD;  Location: [facility];  Service: Gynecology;  Laterality: N/A;  . CYSTOSCOPY  02/25/2012   Procedure: CYSTOSCOPY;  Surgeon: Daria Pastures, MD;  Location: [facility];  Service: Gynecology;  Laterality: N/A;  . KNEE ARTHROSCOPY  2010  . LAPAROSCOPIC ASSISTED VAGINAL HYSTERECTOMY  06/17/2011   Procedure: LAPAROSCOPIC ASSISTED VAGINAL HYSTERECTOMY;  Surgeon: Arloa Koh;  Location: [facility];  Service: Gynecology;  Laterality: N/A;  Attempted   . SALPINGOOPHORECTOMY  06/17/2011   Procedure: SALPINGO OOPHERECTOMY;  Surgeon: Arloa Koh;  Location: [facility];  Service: Gynecology;  Laterality: Bilateral;  . TONSILLECTOMY      Prior to Admission medications   Medication Sig Start Date End Date Taking? Authorizing Provider  albuterol (PROVENTIL HFA;VENTOLIN HFA) 108 (90 Base) MCG/ACT inhaler Inhale 2 puffs into the lungs every 6 (six) hours as needed for wheezing or shortness of breath. 10/21/17  Yes Cynthia Lung, MD  esomeprazole (NEXIUM) 40 MG capsule TAKE ONE (1) CAPSULE BY MOUTH 2 TIMES DAILY Patient taking differently: Take 40 mg by mouth 2 (two) times daily before a meal. TAKE ONE (1) CAPSULE BY MOUTH 2 TIMES DAILY 10/18/18  Yes Cynthia Lung, MD  hydrochlorothiazide (MICROZIDE) 12.5 MG capsule Take 1 capsule (12.5 mg  total) by mouth daily. 02/09/19  Yes Cynthia Lung, MD  losartan (COZAAR) 100 MG tablet Take 1 tablet (100 mg total) by mouth daily. Take 1/2 tablet by mouth daily Patient taking differently: Take 50 mg by mouth daily. Take 1/2 tablet by mouth daily   02/09/19  Yes Cynthia Lung, MD  Polyvinyl Alcohol-Povidone (REFRESH OP) Place 1 drop into both eyes daily as needed. Reported on 05/08/2016   Yes [provider]  pramipexole (MIRAPEX) 1 MG tablet TAKE ONE (1) TABLET BY MOUTH EVERY DAY Patient taking differently: Take 1 mg by mouth daily. TAKE ONE (1) TABLET BY MOUTH EVERY DAY 10/18/18  Yes Cynthia Lung, MD  venlafaxine XR (EFFEXOR-XR) 150 MG 24 hr capsule TAKE TWO CAPSULES BY MOUTH DAILY Patient  taking differently: Take 300 mg by mouth daily. TAKE TWO CAPSULES BY MOUTH DAILY 10/18/18  Yes Cynthia Lung, MD  amoxicillin (AMOXIL) 875 MG tablet Take 1 tablet (875 mg total) by mouth 2 (two) times daily. Patient not taking: Reported on 08/26/2019 08/15/19   Cynthia Lung, MD  losartan-hydrochlorothiazide [facility]) 50-12.5 MG tablet TAKE ONE (1) TABLET BY MOUTH EVERY DAY Patient not taking: Reported on 08/26/2019 10/18/18   Cynthia Lung, MD  nitrofurantoin, macrocrystal-monohydrate, (MACROBID) 100 MG capsule Take 1 capsule (100 mg total) by mouth 2 (two) times daily. Patient not taking: Reported on 06/30/2018 04/29/18   Harland Dingwall L, NP-C  ondansetron (ZOFRAN) 4 MG tablet Take 1 tablet (4 mg total) by mouth every 8 (eight) hours as needed for nausea or vomiting. Patient not taking: Reported on 10/04/2018 06/30/18   Cynthia Lung, MD    Scheduled Meds: . potassium chloride  40 mEq Oral Once  . sodium chloride flush  3 mL Intravenous Once   Infusions: . ciprofloxacin    . dextrose 5 % and 0.9% NaCl 100 mL/hr at 08/26/19 0452  . metronidazole    . potassium chloride     PRN Meds: acetaminophen **OR** acetaminophen, albuterol, hydrALAZINE, morphine injection, ondansetron **OR**  ondansetron (ZOFRAN) IV   Allergies as of 08/25/2019 - Review Complete 08/25/2019  Allergen Reaction Noted  . Iodine  03/17/2008    Family History  Problem Relation Age of Onset  . Diabetes Mother   . Clotting disorder Father   . Crohn's disease Sister     Social History   Socioeconomic History  . Marital status: Widowed    Spouse name: Not on file  . Number of children: 1  . Years of education: Not on file  . Highest education level: Not on file  Occupational History  . Occupation: Designer, television/film set: [employer]  . Financial resource strain: Not on file  . Food insecurity    Worry: Not on file    Inability: Not on file  . Transportation needs    Medical: Not on file    Non-medical: Not on file  Tobacco Use  . Smoking status: Current Every Day Smoker    Packs/day: 0.50    Types: Cigarettes    Last attempt to quit: 12/07/2011    Years since quitting: 7.7  . Smokeless tobacco: Never Used  Substance and Sexual Activity  . Alcohol use: Yes    Comment: socially  . Drug use: Yes    Types: Cocaine    Comment: does cocaine about 1x/week  . Sexual activity: Not Currently  Lifestyle  . Physical activity    Days per week: Not on file    Minutes per session: Not on file  . Stress: Not on file  Relationships  . Social Herbalist on phone: Not on file    Gets together: Not on file    Attends religious service: Not on file    Active member of club or organization: Not on file    Attends meetings of clubs or organizations: Not on file    Relationship status: Not on file  . Intimate partner violence    Fear of current or ex partner: Not on file    Emotionally abused: Not on file    Physically abused: Not on file    Forced sexual activity: Not on file  Other Topics Concern  . Not on file  Social History  Narrative   Lives with boyfriend who she states is an alcoholic    REVIEW OF SYSTEMS: Constitutional: Tired, somewhat weak. ENT:  No  nose bleeds Pulm: Minimally productive cough.  No dyspnea. CV:  No palpitations.  Periodic dependent edema in her feet/ankles when she has been standing for a while.  No angina or chest pressure. GU:  No hematuria, no frequency GI: See HPI. Heme: Denies unusual or excessive bleeding or bruising. Transfusions: No prior transfusion with blood products. Neuro:  No headaches, no peripheral tingling or numbness.  No seizures, no syncope, no dizziness. Derm:  No itching, no rash or sores.  Endocrine:  No sweats or chills.  No polyuria or dysuria Immunization: Has not had a flu shot yet for this year. Travel:  None beyond local counties in last few months.    PHYSICAL EXAM: Vital signs in last 24 hours: Vitals:   08/26/19 0715 08/26/19 0730  BP: (!) 184/102 (!) 167/105  Pulse: 73 68  Resp: 16 15  Temp:    SpO2: 96% 96%   Wt Readings from Last 3 Encounters:  08/25/19 85.3 kg  08/15/19 87.1 kg  10/04/18 99.4 kg    General: Pleasant, overweight, moderately acutely ill appearing WF Head: No facial asymmetry or swelling.  No signs of head trauma. Eyes: No scleral icterus.  No conjunctival pallor.  EOMI. Ears: Not hard of hearing. Nose: Sounds congested, NG tube in place. Mouth: Dentition.  Oral mucosa moist, pink, clear.  Tongue midline. Neck: No JVD, no thyromegaly.  No masses. Lungs: Clear bilaterally.  Slight rhonchorous cough.  Not short of breath. Heart: RRR.  No MRG.  S1, S2 present. Abdomen: Soft, active bowel sounds.  Nondistended.  Tender in the left quadrant lateral to slightly below the level of umbilicus.  Lesser tenderness across the upper abdomen.  No guarding or rebound.  No hernias, masses, organomegaly..   Rectal: Deferred Musc/Skeltl: No joint redness, swelling, deformities. Extremities: Prominent venules in the ankles, back of feet. Neurologic: Excellent historian.  Alert.  Oriented x3.  No tremors.  No gross deficits. Skin: No telangiectasia, no sores. Tattoos:  None observed. Nodes: No cervical or inguinal adenopathy. Psych: Fluid speech, affect normal.  Cooperative.    Intake/Output from previous day: No intake/output data recorded. Intake/Output this shift: No intake/output data recorded.  LAB RESULTS: Recent Labs    08/25/19 1422  WBC 10.4  HGB 14.9  HCT 45.3  PLT 328   BMET Lab Results  Component Value Date   NA 136 08/26/2019   NA 137 08/25/2019   NA 142 05/08/2016   K 3.0 (L) 08/26/2019   K 3.2 (L) 08/25/2019   K 4.3 05/08/2016   CL 101 08/26/2019   CL 99 08/25/2019   CL 108 05/08/2016   CO2 26 08/26/2019   CO2 26 08/25/2019   CO2 24 05/08/2016   GLUCOSE 107 (H) 08/26/2019   GLUCOSE 90 08/25/2019   GLUCOSE 93 05/08/2016   BUN 6 08/26/2019   BUN 7 08/25/2019   BUN 13 05/08/2016   CREATININE 0.90 08/26/2019   CREATININE 0.84 08/25/2019   CREATININE 0.94 05/08/2016   CALCIUM 8.5 (L) 08/26/2019   CALCIUM 9.5 08/25/2019   CALCIUM 8.9 05/08/2016   LFT Recent Labs    08/25/19 1422  PROT 7.3  ALBUMIN 3.1*  AST 15  ALT 15  ALKPHOS 75  BILITOT 0.8   Lipase     Component Value Date/Time   LIPASE 21 08/25/2019 1422  RADIOLOGY STUDIES: Ct Abdomen Pelvis W Contrast  Result Date: 08/26/2019 CLINICAL DATA:  Abdominal pain, unintentional weight loss EXAM: CT ABDOMEN AND PELVIS WITH CONTRAST TECHNIQUE: Multidetector CT imaging of the abdomen and pelvis was performed using the standard protocol following bolus administration of intravenous contrast. CONTRAST:  114m OMNIPAQUE IOHEXOL 300 MG/ML  SOLN COMPARISON:  June 18, 2011 FINDINGS: Lower chest: The visualized heart size within normal limits. No pericardial fluid/thickening. No hiatal hernia. The visualized portions of the lungs are clear. Hepatobiliary: The liver is normal in density without focal abnormality.The main portal vein is patent. No evidence of calcified gallstones, gallbladder wall thickening or biliary dilatation. Pancreas: Unremarkable. No  pancreatic ductal dilatation or surrounding inflammatory changes. Spleen: Normal in size without focal abnormality. Adrenals/Urinary Tract: Both adrenal glands appear normal. The kidneys and collecting system appear normal without evidence of urinary tract calculus or hydronephrosis. Bladder is unremarkable. Stomach/Bowel: The stomach is normal in appearance. There is mildly dilated fluid and air-filled loops of proximal small bowel measuring up to 3.7 cm down to the level several segments of distal ileal loops which appear to have segments of wall thickening with surrounding mesenteric fat stranding changes and areas of narrowing/stricturing. There is mesenteric hyperemia with focal area of narrowing/stricturing seen at the terminal ileum. There are scattered surrounding small lymph nodes present. There is mild fat stranding changes seen adjacent to a focal segment of sigmoid colon likely from the adjacent ileal loop inflammatory changes. Vascular/Lymphatic: There are no enlarged mesenteric, retroperitoneal, or pelvic lymph nodes. No significant vascular findings are present. Reproductive: The patient is status post hysterectomy. Other: No evidence of abdominal wall mass or hernia. Musculoskeletal: No acute or significant osseous findings. IMPRESSION: 1. A several focal segments of distal ileum with surrounding inflammatory changes and short segment areas of narrowing/stricturing to the level of the terminal ileum. This likely represents inflammatory and/or infectious colitis. 2. Mild proximal partial small-bowel obstruction to the level of the distal ileal loops. 3. Reactive small lymph nodes seen within the right lower quadrant. Electronically Signed   By: BPrudencio PairM.D.   On: 08/26/2019 02:48   Dg Abd Portable 1 View  Result Date: 08/26/2019 CLINICAL DATA:  NG tube placement. EXAM: PORTABLE ABDOMEN - 1 VIEW COMPARISON:  CT abdomen/pelvis, same date. FINDINGS: The NG tube is coursing down the esophagus  and into the stomach. The tip is in the body region. The visualized lungs are clear. Scattered air-filled small bowel loops noted in the upper abdomen. No free air. IMPRESSION: NG tube in good position. Electronically Signed   By: PMarijo SanesM.D.   On: 08/26/2019 07:18     IMPRESSION:   *    PSBO/SBO.  CT showing inflammatory changes in the distal/terminal ileum with suturing.No history Crohn's disease but suspect Crohn's disease. Interesting that her sister was dxd with Crohn's ~ age 60    *   Serrated adenomatous polyp 2012.  Due surveillance colonoscopy 07/2016, overdue.  *   Hypokalemia.  *    Hypertension.  Blood pressures here in the ED as high as 190/108.      [name]:     *   Empiric Solu-Medrol 40 mg daily.   Aim is to perform colonoscopy with intubation of distal ileum to obtain views and biopsy prior to discharge.  However currently do not think she would tolerate bowel prep.  *   Clear liquids as tolerated if/when NG tube removed.   SAzucena Freed 08/26/2019, 10:42 AM Phone  [phone]    ________________________________________________________________________  Velora Heckler GI MD note:  I personally examined the patient, reviewed the data and agree with the assessment and [name] described above.  She has had intermittent but progressive GI symptoms for about a year now.  Vomiting, abd pains, diarrhea.  IN the past month these symptoms have been nearly constant and she eventually came to the ER for evaluation. CT scan shows scattered inflammation in her ileum and fat stranding around her sigmoid.  Sister was just diagnosed with IBD, Crohn's disease.  I think it is very possible that she has Crohn's disease as well.  I am adding sed rate and CRP to specimin in lab (would be helpful as markers to follow her disease if elevated now).  Will check GI pathogen panel and C. Diff as well. She will very likely need colonoscopy with TI examination this admit, however with PSBO I don't  think she'll tolerate prep just yet. Will empirically start her on IV steroids and follow along.   Owens Loffler, MD [facility] Pager [phone]

## 2019-08-26 NOTE — ED Notes (Signed)
NG was placed to intermittent/low suction.

## 2019-08-26 NOTE — Consult Note (Signed)
[place] [birth year]  UA:[id number].    Requesting MD: Dr. Hosie Poisson Chief Complaint/Reason for Consult: pSBO  HPI:  This is a 58yo white female with a history of HTN, tobacco abuse, and GERD who has been having abdominal symptoms with intermittent epigastric pain since 2011.  She was placed on Nexium for this and it seemed to improve. She had an EGD and c-scope by Dr. Fuller Plan around that time with no significant findings per the patient.  Over the next several years, until now she has been having intermittent symptoms still, mostly including abdominal pain, but with some diarrhea, nausea.  She lost her insurance and has not been able to follow up with GI.  She states over the last several months she has last 40# unintentionally due to a lack of appetite from these symptoms.  They have been stable until about several weeks ago and really over the last week.  She started to have nausea and vomiting, last episode was on Sunday, pain in her epigastrium and somewhat diffusely as well, along with diarrhea.  Her last BM was 2 days ago.  She is unsure when she last passed flatus.  She denies any fevers, chills, CP, SOB, etc.  She finally decided to present to the [facility] for evaluation as these symptoms were much worse than they had been in the past.  She states that her sister has Crohn's disease.  She underwent a CT scan that reveals multiple focal segments of distal ileum with inflammatory changes and narrowing/stricturing at the level of the TI.  This may represent IBD.  She has a resultant pSBO secondary to this inflammation.  We have been asked to see her for further evaluation.  NGT has already been placed with no output.  ROS: ROS: Please see HPI, otherwise all other systems have been reviewed and are negative.  Family History  Problem Relation Age of Onset  . Diabetes Mother   . Clotting disorder Father   . Crohn's disease Sister     Past Medical History:  Diagnosis Date  . Anemia  2012  . Anxiety   . Bronchitis   . Depression   . GERD (gastroesophageal reflux disease)   . Headache(784.0)    MIGRAINE  . Heart murmur   . Hiatal hernia   . Hypertension   . IBS (irritable bowel syndrome)   . Incontinence   . MVP (mitral valve prolapse)   . RLS (restless legs syndrome)     Past Surgical History:  Procedure Laterality Date  . ABDOMINAL HYSTERECTOMY  06/17/2011   Procedure: HYSTERECTOMY ABDOMINAL;  Surgeon: Arloa Koh;  Location: [facility];  Service: Gynecology;  Laterality: N/A;  Converted to Abdominal Hysterectomy with lysis of adhesions   . ANTERIOR AND POSTERIOR REPAIR  02/25/2012   Procedure: ANTERIOR (CYSTOCELE) AND POSTERIOR REPAIR (RECTOCELE);  Surgeon: Daria Pastures, MD;  Location: [facility];  Service: Gynecology;  Laterality: N/A;  ANterior cystocele repair ONLY  . BLADDER SUSPENSION  02/25/2012   Procedure: TRANSVAGINAL TAPE (TVT) PROCEDURE;  Surgeon: Daria Pastures, MD;  Location: [facility];  Service: Gynecology;  Laterality: N/A;  . CYSTOSCOPY  02/25/2012   Procedure: CYSTOSCOPY;  Surgeon: Daria Pastures, MD;  Location: [facility];  Service: Gynecology;  Laterality: N/A;  . KNEE ARTHROSCOPY  2010  . LAPAROSCOPIC ASSISTED VAGINAL HYSTERECTOMY  06/17/2011   Procedure: LAPAROSCOPIC ASSISTED VAGINAL HYSTERECTOMY;  Surgeon: Arloa Koh;  Location: [facility];  Service: Gynecology;  Laterality: N/A;  Attempted   . SALPINGOOPHORECTOMY  06/17/2011   Procedure: SALPINGO OOPHERECTOMY;  Surgeon: Arloa Koh;  Location: [facility];  Service: Gynecology;  Laterality: Bilateral;  . TONSILLECTOMY      Social History:  reports that she has been smoking cigarettes. She has been smoking about 0.50 packs per day. She has never used smokeless tobacco. She reports current alcohol use. She reports current drug use. Drug: Cocaine.  Allergies:  Allergies  Allergen Reactions  . Iodine     REACTION: in eye gtts    (Not in a hospital admission)    Physical Exam: Blood  pressure (!) 167/105, pulse 68, temperature 99.2 F (37.3 C), temperature source Oral, resp. rate 15, height 5\' 4"  (1.626 m), weight 85.3 kg, last menstrual period 06/10/2011, SpO2 96 %. General: pleasant, WD, somewhat obese white female who is laying in bed in NAD HEENT: head is normocephalic, atraumatic.  Sclera are noninjected.  PERRL.  Ears and nose without any masses or lesions.  NGT in place with no output  Mouth is pink and moist Heart: regular, rate, and rhythm.  Normal s1,s2. No obvious murmurs, gallops, or rubs noted.  Palpable radial and pedal pulses bilaterally Lungs: CTAB, no wheezes, rhonchi, or rales noted.  Respiratory effort nonlabored Abd: soft, minimal tenderness but just got pain medication so difficult to determine where she has the most pain, ND, +BS, no masses, hernias, or organomegaly MS: all 4 extremities are symmetrical with no cyanosis, clubbing, or edema. Skin: warm and dry with no masses, lesions, or rashes Psych: A&Ox3 with an appropriate affect.   Results for orders placed or performed during the hospital encounter of 08/25/19 (from the past 48 hour(s))  Lipase, blood     Status: None   Collection Time: 08/25/19  2:22 PM  Result Value Ref Range   Lipase 21 11 - 51 U/L    Comment: Performed at [facility], [place] [address] [place], [place] [address]  Comprehensive metabolic panel     Status: Abnormal   Collection Time: 08/25/19  2:22 PM  Result Value Ref Range   Sodium 137 135 - 145 mmol/L   Potassium 3.2 (L) 3.5 - 5.1 mmol/L   Chloride 99 98 - 111 mmol/L   CO2 26 22 - 32 mmol/L   Glucose, Bld 90 70 - 99 mg/dL   BUN 7 6 - 20 mg/dL   Creatinine, Ser 0.84 0.44 - 1.00 mg/dL   Calcium 9.5 8.9 - 10.3 mg/dL   Total Protein 7.3 6.5 - 8.1 g/dL   Albumin 3.1 (L) 3.5 - 5.0 g/dL   AST 15 15 - 41 U/L   ALT 15 0 - 44 U/L   Alkaline Phosphatase 75 38 - 126 U/L   Total Bilirubin 0.8 0.3 - 1.2 mg/dL   GFR calc non Af Amer >60 >60 mL/min   GFR calc Af Amer >60  >60 mL/min   Anion gap 12 5 - 15    Comment: Performed at [facility], [place] [address], [place] [address]  CBC     Status: Abnormal   Collection Time: 08/25/19  2:22 PM  Result Value Ref Range   WBC 10.4 4.0 - 10.5 K/uL   RBC 5.30 (H) 3.87 - 5.11 MIL/uL   Hemoglobin 14.9 12.0 - 15.0 g/dL   HCT 45.3 36.0 - 46.0 %   MCV 85.5 80.0 - 100.0 fL   MCH 28.1 26.0 - 34.0 pg   MCHC 32.9 30.0 -  36.0 g/dL   RDW 12.9 11.5 - 15.5 %   Platelets 328 150 - 400 K/uL   nRBC 0.0 0.0 - 0.2 %    Comment: Performed at [facility], [place] [address] [place], [place] [address]  I-Stat beta hCG blood, ED     Status: Abnormal   Collection Time: 08/25/19  2:54 PM  Result Value Ref Range   I-stat hCG, quantitative 6.6 (H) <5 mIU/mL   Comment 3            Comment:   GEST. AGE      CONC.  (mIU/mL)   <=1 WEEK        5 - 50     2 WEEKS       50 - 500     3 WEEKS       100 - 10,000     4 WEEKS     1,000 - 30,000        FEMALE AND NON-PREGNANT FEMALE:     LESS THAN 5 mIU/mL   Urinalysis, Routine w reflex microscopic     Status: Abnormal   Collection Time: 08/25/19  8:20 PM  Result Value Ref Range   Color, Urine AMBER (A) YELLOW    Comment: BIOCHEMICALS MAY BE AFFECTED BY COLOR   APPearance HAZY (A) CLEAR   Specific Gravity, Urine 1.027 1.005 - 1.030   pH 5.0 5.0 - 8.0   Glucose, UA NEGATIVE NEGATIVE mg/dL   Hgb urine dipstick NEGATIVE NEGATIVE   Bilirubin Urine NEGATIVE NEGATIVE   Ketones, ur 20 (A) NEGATIVE mg/dL   Protein, ur 100 (A) NEGATIVE mg/dL   Nitrite NEGATIVE NEGATIVE   Leukocytes,Ua NEGATIVE NEGATIVE   RBC / HPF 0-5 0 - 5 RBC/hpf   WBC, UA 0-5 0 - 5 WBC/hpf   Bacteria, UA RARE (A) NONE SEEN   Squamous Epithelial / LPF 6-10 0 - 5   Mucus PRESENT     Comment: Performed at [facility], [address] [place], [place] [address]  HIV Antibody (routine testing w rflx)     Status: None   Collection Time: 08/26/19  4:19 AM  Result Value Ref Range   HIV Screen 4th  Generation wRfx NON REACTIVE NON REACTIVE    Comment: Performed at [facility] [address] [place], [place] [id number]  Basic metabolic panel     Status: Abnormal   Collection Time: 08/26/19  8:15 AM  Result Value Ref Range   Sodium 136 135 - 145 mmol/L   Potassium 3.0 (L) 3.5 - 5.1 mmol/L   Chloride 101 98 - 111 mmol/L   CO2 26 22 - 32 mmol/L   Glucose, Bld 107 (H) 70 - 99 mg/dL   BUN 6 6 - 20 mg/dL   Creatinine, Ser 0.90 0.44 - 1.00 mg/dL   Calcium 8.5 (L) 8.9 - 10.3 mg/dL   GFR calc non Af Amer >60 >60 mL/min   GFR calc Af Amer >60 >60 mL/min   Anion gap 9 5 - 15    Comment: Performed at [facility] [address] [place], [place] [address]  CBG monitoring, ED     Status: Abnormal   Collection Time: 08/26/19  8:39 AM  Result Value Ref Range   Glucose-Capillary 109 (H) 70 - 99 mg/dL   Ct Abdomen Pelvis W Contrast  Result Date: 08/26/2019 CLINICAL DATA:  Abdominal pain, unintentional weight loss EXAM: CT ABDOMEN AND PELVIS WITH CONTRAST  TECHNIQUE: Multidetector CT imaging of the abdomen and pelvis was performed using the standard protocol following bolus administration of intravenous contrast. CONTRAST:  160mL OMNIPAQUE IOHEXOL 300 MG/ML  SOLN COMPARISON:  June 18, 2011 FINDINGS: Lower chest: The visualized heart size within normal limits. No pericardial fluid/thickening. No hiatal hernia. The visualized portions of the lungs are clear. Hepatobiliary: The liver is normal in density without focal abnormality.The main portal vein is patent. No evidence of calcified gallstones, gallbladder wall thickening or biliary dilatation. Pancreas: Unremarkable. No pancreatic ductal dilatation or surrounding inflammatory changes. Spleen: Normal in size without focal abnormality. Adrenals/Urinary Tract: Both adrenal glands appear normal. The kidneys and collecting system appear normal without evidence of urinary tract calculus or hydronephrosis. Bladder is unremarkable.  Stomach/Bowel: The stomach is normal in appearance. There is mildly dilated fluid and air-filled loops of proximal small bowel measuring up to 3.7 cm down to the level several segments of distal ileal loops which appear to have segments of wall thickening with surrounding mesenteric fat stranding changes and areas of narrowing/stricturing. There is mesenteric hyperemia with focal area of narrowing/stricturing seen at the terminal ileum. There are scattered surrounding small lymph nodes present. There is mild fat stranding changes seen adjacent to a focal segment of sigmoid colon likely from the adjacent ileal loop inflammatory changes. Vascular/Lymphatic: There are no enlarged mesenteric, retroperitoneal, or pelvic lymph nodes. No significant vascular findings are present. Reproductive: The patient is status post hysterectomy. Other: No evidence of abdominal wall mass or hernia. Musculoskeletal: No acute or significant osseous findings. IMPRESSION: 1. A several focal segments of distal ileum with surrounding inflammatory changes and short segment areas of narrowing/stricturing to the level of the terminal ileum. This likely represents inflammatory and/or infectious colitis. 2. Mild proximal partial small-bowel obstruction to the level of the distal ileal loops. 3. Reactive small lymph nodes seen within the right lower quadrant. Electronically Signed   By: Prudencio Pair M.D.   On: 08/26/2019 02:48   Dg Abd Portable 1 View  Result Date: 08/26/2019 CLINICAL DATA:  NG tube placement. EXAM: PORTABLE ABDOMEN - 1 VIEW COMPARISON:  CT abdomen/pelvis, same date. FINDINGS: The NG tube is coursing down the esophagus and into the stomach. The tip is in the body region. The visualized lungs are clear. Scattered air-filled small bowel loops noted in the upper abdomen. No free air. IMPRESSION: NG tube in good position. Electronically Signed   By: Marijo Sanes M.D.   On: 08/26/2019 07:18      Assessment/Plan HTN Tobacco  abuse Weekly to biweekly use of cocaine GERD  Abdominal pain, ? Crohn's disease, pSBO The patient has an NGT but currently has no output.  Last emesis was 5 days ago.  This is not likely needed, but will leave it in today and can likely remove tomorrow if doing well.  Her CT scan findings along with duration of her symptoms and a 1st degree relative with crohn's disease makes these findings highly concerning for crohn's disease.  Recommend GI evaluation for further management.  The patient is certainly not completely obstructed at this point at she is having BMs and no further N/V.  We will follow along, but there are no acute plans for surgical intervention at this time.   FEN - NPO/NGT/IVFs VTE - per medicine, ok from our standpoint ID - C/F  Henreitta Cea, [facility] 08/26/2019, 10:39 AM Please see Amion for pager number during day hours 7:00am-4:30pm

## 2019-08-26 NOTE — ED Provider Notes (Signed)
TIME SEEN: 12:30 AM  CHIEF COMPLAINT: Abdominal pain  HPI: Patient is a 58 year old female with history of hypertension, previous hysterectomy and bilateral salpingo-oophorectomy who presents to the emergency department with 5 days of worsening upper abdominal pain that she is unable to describe.  Pain is worse after eating.  States that this pain is actually been ongoing for several months with 40 pound unintentional weight loss.  No radiation of pain.  States waxes and wanes but has become more constant.  She has had multiple episodes of nausea and vomiting but last episode was 5 days ago.  History of having some diarrhea.  No bloody stools or melena.  No chest pain or shortness of breath.  No fever.  States she has seen her PCP for this but states they have been hesitant to perform any tests as patient does not have insurance.  She states she has had an endoscopy about 5 years ago by Dr. Fuller Plan and was diagnosed with GERD.  Takes Nexium every day.  No recent alcohol use.  Only takes ibuprofen occasionally.  No other NSAID use.  She states that she lost her job 2 days ago and has increased stress recently.  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  She initially thought her symptoms were secondary to stress.  ROS: See HPI Constitutional: no fever  Eyes: no drainage  ENT: no runny nose   Cardiovascular:  no chest pain  Resp: no SOB  GI: + vomiting and diarrhea GU: no dysuria Integumentary: no rash  Allergy: no hives  Musculoskeletal: no leg swelling  Neurological: no slurred speech ROS otherwise negative  PAST MEDICAL HISTORY/PAST SURGICAL HISTORY:  Past Medical History:  Diagnosis Date  . Anemia 2012  . Anxiety   . Bronchitis   . Depression   . GERD (gastroesophageal reflux disease)   . Headache(784.0)    MIGRAINE  . Heart murmur   . Hiatal hernia   . Hypertension   . IBS (irritable bowel syndrome)   . Incontinence   . MVP (mitral valve prolapse)   . RLS  (restless legs syndrome)     MEDICATIONS:  Prior to Admission medications   Medication Sig Start Date End Date Taking? Authorizing Provider  albuterol (PROVENTIL HFA;VENTOLIN HFA) 108 (90 Base) MCG/ACT inhaler Inhale 2 puffs into the lungs every 6 (six) hours as needed for wheezing or shortness of breath. Patient not taking: Reported on 08/15/2019 10/21/17   Denita Lung, MD  amoxicillin (AMOXIL) 875 MG tablet Take 1 tablet (875 mg total) by mouth 2 (two) times daily. 08/15/19   Denita Lung, MD  esomeprazole (NEXIUM) 40 MG capsule TAKE ONE (1) CAPSULE BY MOUTH 2 TIMES DAILY 10/18/18   Denita Lung, MD  hydrochlorothiazide (MICROZIDE) 12.5 MG capsule Take 1 capsule (12.5 mg total) by mouth daily. 02/09/19   Denita Lung, MD  losartan (COZAAR) 100 MG tablet Take 1 tablet (100 mg total) by mouth daily. Take 1/2 tablet by mouth daily 02/09/19   Denita Lung, MD  losartan-hydrochlorothiazide [facility]) 50-12.5 MG tablet TAKE ONE (1) TABLET BY MOUTH EVERY DAY 10/18/18   Denita Lung, MD  nitrofurantoin, macrocrystal-monohydrate, (MACROBID) 100 MG capsule Take 1 capsule (100 mg total) by mouth 2 (two) times daily. Patient not taking: Reported on 06/30/2018 04/29/18   Harland Dingwall L, NP-C  ondansetron (ZOFRAN) 4 MG tablet Take 1 tablet (4 mg total) by mouth every 8 (eight) hours as needed for nausea or  vomiting. Patient not taking: Reported on 10/04/2018 06/30/18   Denita Lung, MD  Polyvinyl Alcohol-Povidone (REFRESH OP) Place 1 drop into both eyes daily as needed. Reported on 05/08/2016    [provider]  pramipexole (MIRAPEX) 1 MG tablet TAKE ONE (1) TABLET BY MOUTH EVERY DAY 10/18/18   Denita Lung, MD  venlafaxine XR (EFFEXOR-XR) 150 MG 24 hr capsule TAKE TWO CAPSULES BY MOUTH DAILY 10/18/18   Denita Lung, MD    ALLERGIES:  Allergies  Allergen Reactions  . Iodine     REACTION: in eye gtts    SOCIAL HISTORY:  Social History   Tobacco Use  . Smoking status:  Current Every Day Smoker    Packs/day: 0.50    Types: Cigarettes    Last attempt to quit: 12/07/2011    Years since quitting: 7.7  . Smokeless tobacco: Never Used  Substance Use Topics  . Alcohol use: Yes    Comment: socially    FAMILY HISTORY: Family History  Problem Relation Age of Onset  . Diabetes Mother   . Clotting disorder Father     EXAM: BP (!) 181/100 (BP Location: Left Arm)   Pulse 83   Temp 99.2 F (37.3 C) (Oral)   Resp 18   Ht 5' 4"  (1.626 m)   Wt 85.3 kg   LMP 06/10/2011 (Exact Date)   SpO2 99%   BMI 32.27 kg/m  CONSTITUTIONAL: Alert and oriented and responds appropriately to questions. Well-appearing; well-nourished HEAD: Normocephalic EYES: Conjunctivae clear, pupils appear equal, EOMI ENT: normal nose; moist mucous membranes NECK: Supple, no meningismus, no nuchal rigidity, no LAD  CARD: RRR; S1 and S2 appreciated; no murmurs, no clicks, no rubs, no gallops RESP: Normal chest excursion without splinting or tachypnea; breath sounds clear and equal bilaterally; no wheezes, no rhonchi, no rales, no hypoxia or respiratory distress, speaking full sentences ABD/GI: Normal bowel sounds; non-distended; soft, tender throughout the upper abdomen, no rebound, no guarding, no peritoneal signs, no hepatosplenomegaly, no tenderness at McBurney's point, negative Murphy sign BACK:  The back appears normal and is non-tender to palpation, there is no CVA tenderness EXT: Normal ROM in all joints; non-tender to palpation; no edema; normal capillary refill; no cyanosis, no calf tenderness or swelling    SKIN: Normal color for age and race; warm; no rash NEURO: Moves all extremities equally PSYCH: The patient's mood and manner are appropriate. Grooming and personal hygiene are appropriate.  MEDICAL DECISION MAKING: Patient here with worsening upper abdominal pain, vomiting and diarrhea.  Differential includes GERD, gastritis, ulcer, cholecystitis, cholelithiasis, cholangitis,  pancreatitis.  Less likely appendicitis, colitis, diverticulitis, bowel obstruction.  I am concerned about this 40 pound unintentional weight loss.  She states she thinks it is secondary to having decreased appetite and nausea and vomiting with increased stress recently.  I am concerned about malignancy as well.  Will obtain CT of her abdomen pelvis for further evaluation.  She did drive herself to the emergency department and would like to drive home if she is discharged.  Will treat symptoms with IV fluids, Protonix, GI cocktail, Zofran.  Low suspicion that this is ACS, PE or dissection.  ED PROGRESS: Patient reports feeling much better after Protonix, GI cocktail and Zofran.  CT scan shows several segments of the distal ileum with inflammatory changes and short segment areas of narrowing/stricturing at the terminal ileum that likely represents inflammatory versus infectious colitis.  Favor infectious colitis given acute onset of symptoms and low-grade temperature here  of 99.2.  No leukocytosis.  Will give IV Cipro and Flagyl.  She also has a partial bowel obstruction to the level of the distal ileal loops.  Will place NG tube.  Will discuss with medicine for admission.  4:05 AM Discussed patient's case with hospitalist, Dr. Hal Hope.  I have recommended admission and patient (and family if present) agree with this plan. Admitting physician will place admission orders.   I reviewed all nursing notes, vitals, pertinent previous records, EKGs, lab and urine results, imaging (as available).    FARAH LEPAK was evaluated in Emergency Department on 08/26/2019 for the symptoms described in the history of present illness. She was evaluated in the context of the global COVID-19 pandemic, which necessitated consideration that the patient might be at risk for infection with the SARS-CoV-2 virus that causes COVID-19. Institutional protocols and algorithms that pertain to the evaluation of patients at risk  for COVID-19 are in a state of rapid change based on information released by regulatory bodies including the CDC and federal and state organizations. These policies and algorithms were followed during the patient's care in the ED.    Vollie Aaron, Delice Bison, DO 08/26/19 Zettie Cooley

## 2019-08-26 NOTE — ED Notes (Signed)
NG was unclamped hooked back up to suction.

## 2019-08-26 NOTE — Progress Notes (Signed)
Pharmacy Antibiotic Note  MADALAINE NAGAI is a 58 y.o. female admitted on 08/25/2019 with intra-abdominal infection.  Pharmacy has been consulted for Cipro dosing. WBC WNL. Renal function good. CT with ?infectious colitis.   Plan: Cipro 400 mg IV q12h Flagyl per MD Trend WBC, temp, renal function  F/U infectious work-up   Height: 5\' 4"  (162.6 cm) Weight: 188 lb (85.3 kg) IBW/kg (Calculated) : 54.7  Temp (24hrs), Avg:99.2 F (37.3 C), Min:99.2 F (37.3 C), Max:99.2 F (37.3 C)  Recent Labs  Lab 08/25/19 1422  WBC 10.4  CREATININE 0.84    Estimated Creatinine Clearance: 76.2 mL/min (by C-G formula based on SCr of 0.84 mg/dL).    Allergies  Allergen Reactions  . Iodine     REACTION: in eye gtts    Narda Bonds, PharmD, [facility] Pharmacist Phone: [phone]

## 2019-08-26 NOTE — H&P (Addendum)
History and Physical    Cynthia [name] VHQ:[id number] DOB: Feb 16, 1961 DOA: 08/25/2019  PCP: Denita Lung, MD  Patient coming from: Home.  Chief Complaint: Abdominal pain.  HPI: Cynthia [name] is a 58 y.o. female with history of hypertension, restless leg syndrome, anxiety depression presents to the ER with complaints of abdominal pain but abdominal pain is mostly in the upper quadrant.  Has been recently on antibiotics about a week ago following which patient start developing watery diarrhea.  Patient states abdominal pain has been on and off for last 2 to 3 weeks which has been gradually worsening unable to eat well.  Had at least 2 or 3 episodes of vomiting.  Last bowel movement was around 24 hours ago.  Given the patient abdominal pain patient comes to the ER.  ED Course: In the ER CT abdomen pelvis shows inflammatory changes with possible stricturing involving the terminal ileum with possible small bowel obstruction.  Labs show potassium 3.2 creatinine 1.8 LFTs normal albumin mildly low at 3.1 WBC 10.4 hemoglobin 14.9 platelets 328.  Cobin 19 is pending.  Patient has NG tube placed and admitted for abdominal pain with possible inflammatory changes in the terminal concern of infectious versus inflammatory changes with small bowel obstruction.  Review of Systems: As per HPI, rest all negative.   Past Medical History:  Diagnosis Date  . Anemia 2012  . Anxiety   . Bronchitis   . Depression   . GERD (gastroesophageal reflux disease)   . Headache(784.0)    MIGRAINE  . Heart murmur   . Hiatal hernia   . Hypertension   . IBS (irritable bowel syndrome)   . Incontinence   . MVP (mitral valve prolapse)   . RLS (restless legs syndrome)     Past Surgical History:  Procedure Laterality Date  . ABDOMINAL HYSTERECTOMY  06/17/2011   Procedure: HYSTERECTOMY ABDOMINAL;  Surgeon: Arloa Koh;  Location: [facility];  Service: Gynecology;  Laterality: N/A;  Converted to Abdominal  Hysterectomy with lysis of adhesions   . ANTERIOR AND POSTERIOR REPAIR  02/25/2012   Procedure: ANTERIOR (CYSTOCELE) AND POSTERIOR REPAIR (RECTOCELE);  Surgeon: Daria Pastures, MD;  Location: [facility];  Service: Gynecology;  Laterality: N/A;  ANterior cystocele repair ONLY  . BLADDER SUSPENSION  02/25/2012   Procedure: TRANSVAGINAL TAPE (TVT) PROCEDURE;  Surgeon: Daria Pastures, MD;  Location: [facility];  Service: Gynecology;  Laterality: N/A;  . CYSTOSCOPY  02/25/2012   Procedure: CYSTOSCOPY;  Surgeon: Daria Pastures, MD;  Location: [facility];  Service: Gynecology;  Laterality: N/A;  . KNEE ARTHROSCOPY  2010  . LAPAROSCOPIC ASSISTED VAGINAL HYSTERECTOMY  06/17/2011   Procedure: LAPAROSCOPIC ASSISTED VAGINAL HYSTERECTOMY;  Surgeon: Arloa Koh;  Location: [facility];  Service: Gynecology;  Laterality: N/A;  Attempted   . SALPINGOOPHORECTOMY  06/17/2011   Procedure: SALPINGO OOPHERECTOMY;  Surgeon: Arloa Koh;  Location: [facility];  Service: Gynecology;  Laterality: Bilateral;  . TONSILLECTOMY       reports that she has been smoking cigarettes. She has been smoking about 0.50 packs per day. She has never used smokeless tobacco. She reports current alcohol use. She reports that she does not use drugs.  Allergies  Allergen Reactions  . Iodine     REACTION: in eye gtts    Family History  Problem Relation Age of Onset  . Diabetes Mother   . Clotting disorder Father     Prior to Admission medications   Medication Sig  Start Date End Date Taking? Authorizing Provider  albuterol (PROVENTIL HFA;VENTOLIN HFA) 108 (90 Base) MCG/ACT inhaler Inhale 2 puffs into the lungs every 6 (six) hours as needed for wheezing or shortness of breath. Patient not taking: Reported on 08/15/2019 10/21/17   Denita Lung, MD  amoxicillin (AMOXIL) 875 MG tablet Take 1 tablet (875 mg total) by mouth 2 (two) times daily. 08/15/19   Denita Lung, MD  esomeprazole (NEXIUM) 40 MG capsule TAKE ONE (1) CAPSULE BY MOUTH  2 TIMES DAILY 10/18/18   Denita Lung, MD  hydrochlorothiazide (MICROZIDE) 12.5 MG capsule Take 1 capsule (12.5 mg total) by mouth daily. 02/09/19   Denita Lung, MD  losartan (COZAAR) 100 MG tablet Take 1 tablet (100 mg total) by mouth daily. Take 1/2 tablet by mouth daily 02/09/19   Denita Lung, MD  losartan-hydrochlorothiazide [facility]) 50-12.5 MG tablet TAKE ONE (1) TABLET BY MOUTH EVERY DAY 10/18/18   Denita Lung, MD  nitrofurantoin, macrocrystal-monohydrate, (MACROBID) 100 MG capsule Take 1 capsule (100 mg total) by mouth 2 (two) times daily. Patient not taking: Reported on 06/30/2018 04/29/18   Harland Dingwall L, NP-C  ondansetron (ZOFRAN) 4 MG tablet Take 1 tablet (4 mg total) by mouth every 8 (eight) hours as needed for nausea or vomiting. Patient not taking: Reported on 10/04/2018 06/30/18   Denita Lung, MD  Polyvinyl Alcohol-Povidone (REFRESH OP) Place 1 drop into both eyes daily as needed. Reported on 05/08/2016    [provider]  pramipexole (MIRAPEX) 1 MG tablet TAKE ONE (1) TABLET BY MOUTH EVERY DAY 10/18/18   Denita Lung, MD  venlafaxine XR (EFFEXOR-XR) 150 MG 24 hr capsule TAKE TWO CAPSULES BY MOUTH DAILY 10/18/18   Denita Lung, MD    Physical Exam: Constitutional: Moderately built and nourished. Vitals:   08/26/19 0130 08/26/19 0215 08/26/19 0245 08/26/19 0300  BP:   (!) 157/91 (!) 147/90  Pulse: 72 78    Resp: (!) 21 13 19  (!) 22  Temp:      TempSrc:      SpO2: 97% 98%    Weight:      Height:       Eyes: Nonicteric no pallor. ENMT: No discharge from the ears eyes nose and mouth were not Neck: No mass felt.  No neck rigidity. Respiratory: No rhonchi or crepitations. Cardiovascular: S1-S2 heard. Abdomen: Soft mild distended nontender bowel sounds feeble. Musculoskeletal: No edema. Skin: No rash. Neurologic: Alert awake oriented to time place and person.  Moves all extremities. Psychiatric: Appears normal.  Normal affect.   Labs on  Admission: I have personally reviewed following labs and imaging studies  CBC: Recent Labs  Lab 08/25/19 1422  WBC 10.4  HGB 14.9  HCT 45.3  MCV 85.5  PLT 859   Basic Metabolic Panel: Recent Labs  Lab 08/25/19 1422  NA 137  K 3.2*  CL 99  CO2 26  GLUCOSE 90  BUN 7  CREATININE 0.84  CALCIUM 9.5   GFR: Estimated Creatinine Clearance: 76.2 mL/min (by C-G formula based on SCr of 0.84 mg/dL). Liver Function Tests: Recent Labs  Lab 08/25/19 1422  AST 15  ALT 15  ALKPHOS 75  BILITOT 0.8  PROT 7.3  ALBUMIN 3.1*   Recent Labs  Lab 08/25/19 1422  LIPASE 21   No results for input(s): AMMONIA in the last 168 hours. Coagulation Profile: No results for input(s): INR, PROTIME in the last 168 hours. Cardiac Enzymes: No results for  input(s): CKTOTAL, CKMB, CKMBINDEX, TROPONINI in the last 168 hours. BNP (last 3 results) No results for input(s): PROBNP in the last 8760 hours. HbA1C: No results for input(s): HGBA1C in the last 72 hours. CBG: No results for input(s): GLUCAP in the last 168 hours. Lipid Profile: No results for input(s): CHOL, HDL, LDLCALC, TRIG, CHOLHDL, LDLDIRECT in the last 72 hours. Thyroid Function Tests: No results for input(s): TSH, T4TOTAL, FREET4, T3FREE, THYROIDAB in the last 72 hours. Anemia Panel: No results for input(s): VITAMINB12, FOLATE, FERRITIN, TIBC, IRON, RETICCTPCT in the last 72 hours. Urine analysis:    Component Value Date/Time   COLORURINE AMBER (A) 08/25/2019 2020   APPEARANCEUR HAZY (A) 08/25/2019 2020   LABSPEC 1.027 08/25/2019 2020   LABSPEC 1.025 05/13/2018 1018   PHURINE 5.0 08/25/2019 2020   GLUCOSEU NEGATIVE 08/25/2019 2020   HGBUR NEGATIVE 08/25/2019 2020   BILIRUBINUR NEGATIVE 08/25/2019 2020   BILIRUBINUR negative 05/13/2018 1018   BILIRUBINUR n 12/04/2011 1014   KETONESUR 20 (A) 08/25/2019 2020   PROTEINUR 100 (A) 08/25/2019 2020   UROBILINOGEN negative 12/04/2011 1014   NITRITE NEGATIVE 08/25/2019 2020    LEUKOCYTESUR NEGATIVE 08/25/2019 2020   Sepsis Labs: @LABRCNTIP (procalcitonin:4,lacticidven:4) )No results found for this or any previous visit (from the past 240 hour(s)).   Radiological Exams on Admission: Ct Abdomen Pelvis W Contrast  Result Date: 08/26/2019 CLINICAL DATA:  Abdominal pain, unintentional weight loss EXAM: CT ABDOMEN AND PELVIS WITH CONTRAST TECHNIQUE: Multidetector CT imaging of the abdomen and pelvis was performed using the standard protocol following bolus administration of intravenous contrast. CONTRAST:  143m OMNIPAQUE IOHEXOL 300 MG/ML  SOLN COMPARISON:  June 18, 2011 FINDINGS: Lower chest: The visualized heart size within normal limits. No pericardial fluid/thickening. No hiatal hernia. The visualized portions of the lungs are clear. Hepatobiliary: The liver is normal in density without focal abnormality.The main portal vein is patent. No evidence of calcified gallstones, gallbladder wall thickening or biliary dilatation. Pancreas: Unremarkable. No pancreatic ductal dilatation or surrounding inflammatory changes. Spleen: Normal in size without focal abnormality. Adrenals/Urinary Tract: Both adrenal glands appear normal. The kidneys and collecting system appear normal without evidence of urinary tract calculus or hydronephrosis. Bladder is unremarkable. Stomach/Bowel: The stomach is normal in appearance. There is mildly dilated fluid and air-filled loops of proximal small bowel measuring up to 3.7 cm down to the level several segments of distal ileal loops which appear to have segments of wall thickening with surrounding mesenteric fat stranding changes and areas of narrowing/stricturing. There is mesenteric hyperemia with focal area of narrowing/stricturing seen at the terminal ileum. There are scattered surrounding small lymph nodes present. There is mild fat stranding changes seen adjacent to a focal segment of sigmoid colon likely from the adjacent ileal loop inflammatory  changes. Vascular/Lymphatic: There are no enlarged mesenteric, retroperitoneal, or pelvic lymph nodes. No significant vascular findings are present. Reproductive: The patient is status post hysterectomy. Other: No evidence of abdominal wall mass or hernia. Musculoskeletal: No acute or significant osseous findings. IMPRESSION: 1. A several focal segments of distal ileum with surrounding inflammatory changes and short segment areas of narrowing/stricturing to the level of the terminal ileum. This likely represents inflammatory and/or infectious colitis. 2. Mild proximal partial small-bowel obstruction to the level of the distal ileal loops. 3. Reactive small lymph nodes seen within the right lower quadrant. Electronically Signed   By: BPrudencio PairM.D.   On: 08/26/2019 02:48     Assessment/Plan Principal Problem:   SBO (small bowel obstruction) ([place]  Active Problems:   GERD   Hypertension   RLS (restless legs syndrome)    1. Small bowel obstruction with inflammatory changes in the terminal ileum with possible stricturing -will need GI/surgical consult.  We will keep patient n.p.o. NG tube suction IV fluids pain of the medications.Antibiotics for now. 2. Recent use of antibiotics with diarrhea.  Closely observe.  May need stool studies if there is further diarrhea. 3. Hypertension -since patient is n.p.o. we will keep patient on as needed IV hydralazine. 4. History of depression GERD and restless leg syndrome presently n.p.o. 5. Mild hypokalemia replace and recheck.  Given that patient has small bowel obstruction with abdominal pain will need at least more than 2 midnight stay in inpatient status.   DVT prophylaxis: SCDs in anticipation of procedure. Code Status: Full code. Family Communication: Discussed with patient. Disposition Plan: Home. Consults called: None but will need to consult general surgery and GI. Admission status: Inpatient.   Rise Patience MD [facility]  Pager [phone].  If 7PM-7AM, please contact night-coverage [email or web address] Password [email or web address]  08/26/2019, 4:11 AM

## 2019-08-26 NOTE — Progress Notes (Signed)
58 year old lady prior history of hypertension anxiety, depression, restless leg syndrome presents to ED with abdominal pain.  CT of the abdomen shows inflammatory changes with possible stricturing involving the terminal ileum and possible small bowel obstruction with changes consistent with inflammatory colitis/infectious colitis.  She was admitted to [facility] service. Surgery and GI consulted.    Assessment and plan Continue with IV antibiotics and IV steroids added by gastroenterology. Continue with NG tube Pain controlled and gentle hydration.   Jeoffrey Massed Demi Trieu,MD [phone]

## 2019-08-26 NOTE — ED Notes (Signed)
Xray bedside.

## 2019-08-26 NOTE — ED Notes (Signed)
Successful NG tube placement. Xray ordered for placement

## 2019-08-26 NOTE — ED Notes (Signed)
The po K+ was given & then the NG was clamped.

## 2019-08-27 ENCOUNTER — Inpatient Hospital Stay (HOSPITAL_COMMUNITY): Payer: Self-pay

## 2019-08-27 LAB — BASIC METABOLIC PANEL
Anion gap: 12 (ref 5–15)
BUN: 8 mg/dL (ref 6–20)
CO2: 21 mmol/L — ABNORMAL LOW (ref 22–32)
Calcium: 9.5 mg/dL (ref 8.9–10.3)
Chloride: 104 mmol/L (ref 98–111)
Creatinine, Ser: 0.93 mg/dL (ref 0.44–1.00)
GFR calc Af Amer: >60 mL/min (ref >60)
GFR calc non Af Amer: >60 mL/min (ref >60)
Glucose, Bld: 156 mg/dL — ABNORMAL HIGH (ref 70–99)
Potassium: 3.8 mmol/L (ref 3.5–5.1)
Sodium: 137 mmol/L (ref 135–145)

## 2019-08-27 LAB — GLUCOSE, CAPILLARY
Glucose-Capillary: 127 mg/dL — ABNORMAL HIGH (ref 70–99)
Glucose-Capillary: 113 mg/dL — ABNORMAL HIGH (ref 70–99)
Glucose-Capillary: 138 mg/dL — ABNORMAL HIGH (ref 70–99)

## 2019-08-27 MED ORDER — TRAZODONE HCL 50 MG PO TABS
50.0000 mg | ORAL_TABLET | Freq: Once | ORAL | Status: AC
  Administered 2019-08-27: 50 mg via ORAL
  Filled 2019-08-27: qty 1

## 2019-08-27 MED ORDER — SODIUM CHLORIDE 0.9 % IV SOLN
INTRAVENOUS | Status: DC
  Administered 2019-08-27 – 2019-08-28 (×2): via INTRAVENOUS

## 2019-08-27 MED ORDER — PRAMIPEXOLE DIHYDROCHLORIDE 0.125 MG PO TABS
1.0000 mg | ORAL_TABLET | Freq: Once | ORAL | Status: AC
  Administered 2019-08-27: 1 mg via ORAL
  Filled 2019-08-27: qty 8

## 2019-08-27 MED ORDER — HYDRALAZINE HCL 25 MG PO TABS
25.0000 mg | ORAL_TABLET | Freq: Three times a day (TID) | ORAL | Status: DC
  Administered 2019-08-27 – 2019-08-29 (×5): 25 mg via ORAL
  Filled 2019-08-27 (×5): qty 1

## 2019-08-27 NOTE — Progress Notes (Signed)
PROGRESS NOTE    Cynthia [name]  SAY:[id number] DOB: 1961/06/17 DOA: 08/25/2019 PCP: Denita Lung, MD    Brief Narrative:  58 year old lady prior history of hypertension anxiety, depression, restless leg syndrome presents to ED with abdominal pain.  CT of the abdomen shows inflammatory changes with possible stricturing involving the terminal ileum and possible small bowel obstruction with changes consistent with inflammatory colitis/infectious colitis.  She was admitted to [facility] service. Surgery and GI consulted. She was started on IV antibiotics and IV steroids.   Assessment & Plan:   Principal Problem:   SBO (small bowel obstruction) (HCC) Active Problems:   GERD   Hypertension   RLS (restless legs syndrome)   SBO/ INFLAMMATORY colitis:  sbo appears to have improved. She is passing flatus bowel sounds are good on exam.  No BM and abd pain improved.  NG tube suction minimal.  D/c ng tube.  Clears today.  Continue with IV fluids, iv antibiotics and iV steroids.  Colonoscopy before discharge.  Appreciate GI and surgery recommendations.    Hypertensive urgency; Started on oral hydralazine.    Hypokalemia Replaced.  Repeat in am.    DVT prophylaxis: scd's Code Status: full code.  Family Communication: none at bedside.  Disposition Plan: pending further eval and clinical improvement.    Consultants:   GI  SURGERY   Procedures: CT abd and pelvis.    Antimicrobials: IV cipro and flagyl   Subjective: Able to pass flatus.   Objective: Vitals:   08/26/19 2346 08/27/19 0438 08/27/19 0642 08/27/19 1501  BP: (!) 159/93 (!) 191/98 (!) 155/87 (!) 163/88  Pulse:  63  92  Resp:  17  17  Temp:  (!) 97.5 F (36.4 C) 97.8 F (36.6 C) 97.8 F (36.6 C)  TempSrc:  Oral Oral Axillary  SpO2:  100% 100% 98%  Weight:      Height:        Intake/Output Summary (Last 24 hours) at 08/27/2019 1805 Last data filed at 08/27/2019 1500 Gross per 24 hour  Intake  1645.48 ml  Output 1430 ml  Net 215.48 ml   Filed Weights   08/25/19 1408  Weight: 85.3 kg    Examination:  General exam: Appears calm and comfortable  Respiratory system: Clear to auscultation. Respiratory effort normal. Cardiovascular system: S1 & S2 heard, RRR. No JVD, murmurs, rubs, gallops or clicks. No pedal edema. Gastrointestinal system: Abdomen is soft, mildly gen tender, bowel sounds good. . Central nervous system: Alert and oriented. No focal neurological deficits. Extremities: Symmetric 5 x 5 power. Skin: No rashes, lesions or ulcers Psychiatry: . Mood & affect appropriate.     Data Reviewed: I have personally reviewed following labs and imaging studies  CBC: Recent Labs  Lab 08/25/19 1422  WBC 10.4  HGB 14.9  HCT 45.3  MCV 85.5  PLT 235   Basic Metabolic Panel: Recent Labs  Lab 08/25/19 1422 08/26/19 0815 08/26/19 1802  NA 137 136  --   K 3.2* 3.0*  --   CL 99 101  --   CO2 26 26  --   GLUCOSE 90 107*  --   BUN 7 6  --   CREATININE 0.84 0.90  --   CALCIUM 9.5 8.5*  --   MG  --   --  1.8   GFR: Estimated Creatinine Clearance: 71.1 mL/min (by C-G formula based on SCr of 0.9 mg/dL). Liver Function Tests: Recent Labs  Lab 08/25/19 1422  AST 15  ALT 15  ALKPHOS 75  BILITOT 0.8  PROT 7.3  ALBUMIN 3.1*   Recent Labs  Lab 08/25/19 1422  LIPASE 21   No results for input(s): AMMONIA in the last 168 hours. Coagulation Profile: No results for input(s): INR, PROTIME in the last 168 hours. Cardiac Enzymes: No results for input(s): CKTOTAL, CKMB, CKMBINDEX, TROPONINI in the last 168 hours. BNP (last 3 results) No results for input(s): PROBNP in the last 8760 hours. HbA1C: No results for input(s): HGBA1C in the last 72 hours. CBG: Recent Labs  Lab 08/26/19 1604 08/26/19 2255 08/27/19 0143 08/27/19 0917 08/27/19 1749  GLUCAP 125* 187* 127* 113* 138*   Lipid Profile: No results for input(s): CHOL, HDL, LDLCALC, TRIG, CHOLHDL,  LDLDIRECT in the last 72 hours. Thyroid Function Tests: No results for input(s): TSH, T4TOTAL, FREET4, T3FREE, THYROIDAB in the last 72 hours. Anemia Panel: No results for input(s): VITAMINB12, FOLATE, FERRITIN, TIBC, IRON, RETICCTPCT in the last 72 hours. Sepsis Labs: No results for input(s): PROCALCITON, LATICACIDVEN in the last 168 hours.  Recent Results (from the past 240 hour(s))  SARS CORONAVIRUS 2 (TAT 6-24 HRS) Nasopharyngeal Nasopharyngeal Swab     Status: None   Collection Time: 08/26/19  4:56 AM   Specimen: Nasopharyngeal Swab  Result Value Ref Range Status   SARS Coronavirus 2 NEGATIVE NEGATIVE Final    Comment: (NOTE) SARS-CoV-2 target nucleic acids are NOT DETECTED. The SARS-CoV-2 RNA is generally detectable in upper and lower respiratory specimens during the acute phase of infection. Negative results do not preclude SARS-CoV-2 infection, do not rule out co-infections with other pathogens, and should not be used as the sole basis for treatment or other patient management decisions. Negative results must be combined with clinical observations, patient history, and epidemiological information. The expected result is Negative. Fact Sheet for Patients: SugarRoll.be Fact Sheet for Healthcare Providers: https://www.woods-mathews.com/ This test is not yet approved or cleared by the [place] FDA and  has been authorized for detection and/or diagnosis of SARS-CoV-2 by FDA under an Emergency Use Authorization (EUA). This EUA will remain  in effect (meaning this test can be used) for the duration of the COVID-19 declaration under Section 56 4(b)(1) of the Act, 21 U.S.C. section 360bbb-3(b)(1), unless the authorization is terminated or revoked sooner. Performed at [facility], [place] [address], [place], [place] [address]          Radiology Studies: Ct Abdomen Pelvis W Contrast  Result Date: 08/26/2019 CLINICAL DATA:   Abdominal pain, unintentional weight loss EXAM: CT ABDOMEN AND PELVIS WITH CONTRAST TECHNIQUE: Multidetector CT imaging of the abdomen and pelvis was performed using the standard protocol following bolus administration of intravenous contrast. CONTRAST:  175m OMNIPAQUE IOHEXOL 300 MG/ML  SOLN COMPARISON:  June 18, 2011 FINDINGS: Lower chest: The visualized heart size within normal limits. No pericardial fluid/thickening. No hiatal hernia. The visualized portions of the lungs are clear. Hepatobiliary: The liver is normal in density without focal abnormality.The main portal vein is patent. No evidence of calcified gallstones, gallbladder wall thickening or biliary dilatation. Pancreas: Unremarkable. No pancreatic ductal dilatation or surrounding inflammatory changes. Spleen: Normal in size without focal abnormality. Adrenals/Urinary Tract: Both adrenal glands appear normal. The kidneys and collecting system appear normal without evidence of urinary tract calculus or hydronephrosis. Bladder is unremarkable. Stomach/Bowel: The stomach is normal in appearance. There is mildly dilated fluid and air-filled loops of proximal small bowel measuring up to 3.7 cm down to the level several segments of distal ileal  loops which appear to have segments of wall thickening with surrounding mesenteric fat stranding changes and areas of narrowing/stricturing. There is mesenteric hyperemia with focal area of narrowing/stricturing seen at the terminal ileum. There are scattered surrounding small lymph nodes present. There is mild fat stranding changes seen adjacent to a focal segment of sigmoid colon likely from the adjacent ileal loop inflammatory changes. Vascular/Lymphatic: There are no enlarged mesenteric, retroperitoneal, or pelvic lymph nodes. No significant vascular findings are present. Reproductive: The patient is status post hysterectomy. Other: No evidence of abdominal wall mass or hernia. Musculoskeletal: No acute or  significant osseous findings. IMPRESSION: 1. A several focal segments of distal ileum with surrounding inflammatory changes and short segment areas of narrowing/stricturing to the level of the terminal ileum. This likely represents inflammatory and/or infectious colitis. 2. Mild proximal partial small-bowel obstruction to the level of the distal ileal loops. 3. Reactive small lymph nodes seen within the right lower quadrant. Electronically Signed   By: Prudencio Pair M.D.   On: 08/26/2019 02:48   Dg Abd Portable 1v  Result Date: 08/27/2019 CLINICAL DATA:  Initial evaluation for enteric tube placement. EXAM: PORTABLE ABDOMEN - 1 VIEW COMPARISON:  Prior radiograph from 08/26/2019. FINDINGS: Enteric tube in place with tip overlying the gastric body, side hole at/just above the level of the GE junction. Advancement by approximately 4 cm suggested. Visualized bowel gas pattern within normal limits. Visualized lungs are grossly clear. IMPRESSION: Enteric tube in place with tip overlying the gastric body, side hole at/just above the level of the GE junction. Advancement by approximately 4 cm suggested to insure adequate placement within the stomach. Electronically Signed   By: Jeannine Boga M.D.   On: 08/27/2019 00:28   Dg Abd Portable 1 View  Result Date: 08/26/2019 CLINICAL DATA:  NG tube placement. EXAM: PORTABLE ABDOMEN - 1 VIEW COMPARISON:  CT abdomen/pelvis, same date. FINDINGS: The NG tube is coursing down the esophagus and into the stomach. The tip is in the body region. The visualized lungs are clear. Scattered air-filled small bowel loops noted in the upper abdomen. No free air. IMPRESSION: NG tube in good position. Electronically Signed   By: Marijo Sanes M.D.   On: 08/26/2019 07:18        Scheduled Meds:  methylPREDNISolone (SOLU-MEDROL) injection  40 mg Intravenous Daily   sodium chloride flush  3 mL Intravenous Once   Continuous Infusions:  ciprofloxacin 400 mg (08/27/19 0842)     metronidazole 500 mg (08/27/19 1432)     LOS: 1 day        Hosie Poisson, MD [facility] Pager [phone]  If 7PM-7AM, please contact night-coverage [email or web address] Password [email or web address] 08/27/2019, 6:05 PM

## 2019-08-27 NOTE — Progress Notes (Addendum)
Progress Note   Subjective  Chief Complaint: Abdominal pain, SBO, stricturing and terminal ileum  This morning, the patient tells me that she is feeling much better, she has been passing flatus over the past 24 hours per her.  Explains that she saw a surgeon who wanted to discontinue her NG tube and try her on clears but nothing has happened yet.   Objective   Vital signs in last 24 hours: Temp:  [97.5 F (36.4 C)-98.3 F (36.8 C)] 97.8 F (36.6 C) (10/24 6629) Pulse Rate:  [63-92] 63 (10/24 0438) Resp:  [11-20] 17 (10/24 0438) BP: (148-197)/(81-109) 155/87 (10/24 0642) SpO2:  [94 %-100 %] 100 % (10/24 0438) Last BM Date: 08/24/19 General:    white female in NAD Heart:  Regular rate and rhythm; no murmurs Lungs: Respirations even and unlabored, lungs CTA bilaterally Abdomen:  Soft, nontender and mild distention.  Decreased bowel sounds all 4 quadrants Extremities:  Without edema. Neurologic:  Alert and oriented,  grossly normal neurologically. Psych:  Cooperative. Normal mood and affect.   Recent Labs    08/25/19 1422  WBC 10.4  HGB 14.9  HCT 45.3  PLT 328   BMET Recent Labs    08/25/19 1422 08/26/19 0815  NA 137 136  K 3.2* 3.0*  CL 99 101  CO2 26 26  GLUCOSE 90 107*  BUN 7 6  CREATININE 0.84 0.90  CALCIUM 9.5 8.5*   LFT Recent Labs    08/25/19 1422  PROT 7.3  ALBUMIN 3.1*  AST 15  ALT 15  ALKPHOS 75  BILITOT 0.8   Studies/Results: Ct Abdomen Pelvis W Contrast  Result Date: 08/26/2019 CLINICAL DATA:  Abdominal pain, unintentional weight loss EXAM: CT ABDOMEN AND PELVIS WITH CONTRAST TECHNIQUE: Multidetector CT imaging of the abdomen and pelvis was performed using the standard protocol following bolus administration of intravenous contrast. CONTRAST:  121m OMNIPAQUE IOHEXOL 300 MG/ML  SOLN COMPARISON:  June 18, 2011 FINDINGS: Lower chest: The visualized heart size within normal limits. No pericardial fluid/thickening. No hiatal hernia. The  visualized portions of the lungs are clear. Hepatobiliary: The liver is normal in density without focal abnormality.The main portal vein is patent. No evidence of calcified gallstones, gallbladder wall thickening or biliary dilatation. Pancreas: Unremarkable. No pancreatic ductal dilatation or surrounding inflammatory changes. Spleen: Normal in size without focal abnormality. Adrenals/Urinary Tract: Both adrenal glands appear normal. The kidneys and collecting system appear normal without evidence of urinary tract calculus or hydronephrosis. Bladder is unremarkable. Stomach/Bowel: The stomach is normal in appearance. There is mildly dilated fluid and air-filled loops of proximal small bowel measuring up to 3.7 cm down to the level several segments of distal ileal loops which appear to have segments of wall thickening with surrounding mesenteric fat stranding changes and areas of narrowing/stricturing. There is mesenteric hyperemia with focal area of narrowing/stricturing seen at the terminal ileum. There are scattered surrounding small lymph nodes present. There is mild fat stranding changes seen adjacent to a focal segment of sigmoid colon likely from the adjacent ileal loop inflammatory changes. Vascular/Lymphatic: There are no enlarged mesenteric, retroperitoneal, or pelvic lymph nodes. No significant vascular findings are present. Reproductive: The patient is status post hysterectomy. Other: No evidence of abdominal wall mass or hernia. Musculoskeletal: No acute or significant osseous findings. IMPRESSION: 1. A several focal segments of distal ileum with surrounding inflammatory changes and short segment areas of narrowing/stricturing to the level of the terminal ileum. This likely represents inflammatory and/or infectious colitis. 2.  Mild proximal partial small-bowel obstruction to the level of the distal ileal loops. 3. Reactive small lymph nodes seen within the right lower quadrant. Electronically Signed    By: Prudencio Pair M.D.   On: 08/26/2019 02:48   Dg Abd Portable 1v  Result Date: 08/27/2019 CLINICAL DATA:  Initial evaluation for enteric tube placement. EXAM: PORTABLE ABDOMEN - 1 VIEW COMPARISON:  Prior radiograph from 08/26/2019. FINDINGS: Enteric tube in place with tip overlying the gastric body, side hole at/just above the level of the GE junction. Advancement by approximately 4 cm suggested. Visualized bowel gas pattern within normal limits. Visualized lungs are grossly clear. IMPRESSION: Enteric tube in place with tip overlying the gastric body, side hole at/just above the level of the GE junction. Advancement by approximately 4 cm suggested to insure adequate placement within the stomach. Electronically Signed   By: Jeannine Boga M.D.   On: 08/27/2019 00:28   Dg Abd Portable 1 View  Result Date: 08/26/2019 CLINICAL DATA:  NG tube placement. EXAM: PORTABLE ABDOMEN - 1 VIEW COMPARISON:  CT abdomen/pelvis, same date. FINDINGS: The NG tube is coursing down the esophagus and into the stomach. The tip is in the body region. The visualized lungs are clear. Scattered air-filled small bowel loops noted in the upper abdomen. No free air. IMPRESSION: NG tube in good position. Electronically Signed   By: Marijo Sanes M.D.   On: 08/26/2019 07:18    Assessment / Plan:   Assessment: 1.  PSBO/SBO: CT showing inflammatory changes in the distal/terminal ileum with stricturing, no history of Crohn's disease but suspect Crohn's disease at this point (sister diagnosed with Crohn's in the age of 49), improving on empiric Solu-Medrol, now passing flatus over the past 24 hours 2.  Sessile serrated adenomatous polyp in 2012  Plan: 1.  Continue empiric Solu-Medrol 40 mg daily 2.  Discontinue NG tube 3.  Do a trial of clear liquids today, if patient is tolerating could consider prepping tomorrow for colonoscopy on Monday 4.  Please await any further recommendations from Dr. Ardis Hughs later today  Thank you  for your kind consultation, we will continue to follow.    LOS: 1 day   Levin Erp  08/27/2019, 11:33 AM  ________________________________________________________________________  Velora Heckler GI MD note:  I personally examined the patient, reviewed the data and agree with the assessment and plan described above.  Clinically she is really improving with time, IV steroids, NG.  The NG was removed and she's ambulating in halls. SHe is going to try clear liquids today.  If she tolerates liquids overnight then we will plan to prep her for a colonoscopy to be done on Monday. If all goes well she may be able to d/c home after the colonsocopy on Monday with oral prednisone and follow scheduled with Dr. Fuller Plan.    Owens Loffler, MD [facility] Pager [phone]

## 2019-08-27 NOTE — Progress Notes (Addendum)
0000 This RN came to check pt and NG tube seemed to be out a little, still on low intermittent suction with 50 cc output of clear, tan colored bile, checked placement and ordered xray for verification.   [id number] Results came and NG in place with tip overlying gastric body, side hole at/just above the level of the GE junction and advancement by approx 4 cm suggested. This RN advanced the tube as indicated. Pt tolerated well and will continue to monitor.

## 2019-08-27 NOTE — Plan of Care (Signed)
  Problem: Education: Goal: Knowledge of General Education information will improve Description Including pain rating scale, medication(s)/side effects and non-pharmacologic comfort measures Outcome: Progressing   

## 2019-08-27 NOTE — Progress Notes (Addendum)
Patient ID: Cynthia [name], female   DOB: 1961-02-27, 58 y.o.   MRN: [id number]   Acute Care Surgery Service Progress Note:    Chief Complaint/Subjective: +flatus Not much pain  No BM  Objective: Vital signs in last 24 hours: Temp:  [97.5 F (36.4 C)-98.3 F (36.8 C)] 97.8 F (36.6 C) (10/24 0642) Pulse Rate:  [63-92] 63 (10/24 0438) Resp:  [11-20] 17 (10/24 0438) BP: (148-197)/(81-109) 155/87 (10/24 0642) SpO2:  [94 %-100 %] 100 % (10/24 0438) Last BM Date: 08/24/19  Intake/Output from previous day: 10/23 0701 - 10/24 0700 In: 1692 [P.O.:60; I.V.:1303.7; IV Piggyback:328.4] Out: 1430 [Urine:1300; Emesis/NG output:130] Intake/Output this shift: No intake/output data recorded.  Lungs: cta, nonlabored  Cardiovascular: reg  Abd: soft, mild obesity; min to NT, not really distended  Extremities: no edema, +SCDs  Neuro: alert, nonfocal  Lab Results: CBC  Recent Labs    08/25/19 1422  WBC 10.4  HGB 14.9  HCT 45.3  PLT 328   BMET Recent Labs    08/25/19 1422 08/26/19 0815  NA 137 136  K 3.2* 3.0*  CL 99 101  CO2 26 26  GLUCOSE 90 107*  BUN 7 6  CREATININE 0.84 0.90  CALCIUM 9.5 8.5*   LFT Hepatic Function Latest Ref Rng & Units 08/25/2019 05/08/2016 10/01/2015  Total Protein 6.5 - 8.1 g/dL 7.3 6.7 7.1  Albumin 3.5 - 5.0 g/dL 3.1(L) 3.4(L) 3.9  AST 15 - 41 U/L 15 16 21   ALT 0 - 44 U/L 15 12 19   Alk Phosphatase 38 - 126 U/L 75 79 95  Total Bilirubin 0.3 - 1.2 mg/dL 0.8 0.3 0.3  Bilirubin, Direct 0.0 - 0.3 mg/dL - - -   PT/INR No results for input(s): LABPROT, INR in the last 72 hours. ABG No results for input(s): PHART, HCO3 in the last 72 hours.  Invalid input(s): PCO2, PO2  Studies/Results:  Anti-infectives: Anti-infectives (From admission, onward)   Start     Dose/Rate Route Frequency Ordered Stop   08/26/19 2000  ciprofloxacin (CIPRO) IVPB 400 mg     400 mg 200 mL/hr over 60 Minutes Intravenous Every 12 hours 08/26/19 0417     08/26/19  1400  metroNIDAZOLE (FLAGYL) IVPB 500 mg     500 mg 100 mL/hr over 60 Minutes Intravenous Every 8 hours 08/26/19 0410     08/26/19 0400  metroNIDAZOLE (FLAGYL) IVPB 500 mg     500 mg 100 mL/hr over 60 Minutes Intravenous  Once 08/26/19 0357 08/26/19 0552   08/26/19 0400  ciprofloxacin (CIPRO) IVPB 400 mg     400 mg 200 mL/hr over 60 Minutes Intravenous  Once 08/26/19 0357 08/26/19 0830      Medications: Scheduled Meds: . methylPREDNISolone (SOLU-MEDROL) injection  40 mg Intravenous Daily  . sodium chloride flush  3 mL Intravenous Once   Continuous Infusions: . ciprofloxacin Stopped (08/26/19 2158)  . metronidazole 500 mg (08/27/19 0511)   PRN Meds:.acetaminophen **OR** acetaminophen, albuterol, hydrALAZINE, morphine injection, ondansetron **OR** ondansetron (ZOFRAN) IV, phenol  Assessment/Plan: Patient Active Problem List   Diagnosis Date Noted  . SBO (small bowel obstruction) ([place]) 08/26/2019  . 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  . RLS (restless legs syndrome) 09/25/2014  . Current smoker 09/19/2011  . Migraine headache 09/19/2011  . Hypertension 09/19/2011  . Obesity (BMI 30-39.9) 09/19/2011  . GERD 03/17/2008  . MVP (mitral valve prolapse) 03/17/2008  . HIATAL HERNIA 05/03/2002   HTN Tobacco abuse Weekly to biweekly use of cocaine GERD  Abdominal pain, ?  Crohn's disease, pSBO Minimal to no NG output. Having flatus.  Steroids per GI   FEN - NPO/NGT/IVFs; hypokalemia - replace potassium, keep K at around 4 VTE - per medicine, ok from our standpoint ID - cipro Disposition:  I think you can remove NG and give clear liquids; steroids/meds per GI for presumed Crohns   LOS: 1 day    Leighton Ruff. Redmond Pulling, MD, FACS General, Bariatric, & Minimally Invasive Surgery [phone] [facility]

## 2019-08-28 LAB — BASIC METABOLIC PANEL
Anion gap: 8 (ref 5–15)
BUN: 7 mg/dL (ref 6–20)
CO2: 25 mmol/L (ref 22–32)
Calcium: 8.9 mg/dL (ref 8.9–10.3)
Chloride: 108 mmol/L (ref 98–111)
Creatinine, Ser: 0.79 mg/dL (ref 0.44–1.00)
GFR calc Af Amer: >60 mL/min (ref >60)
GFR calc non Af Amer: >60 mL/min (ref >60)
Glucose, Bld: 88 mg/dL (ref 70–99)
Potassium: 3.1 mmol/L — ABNORMAL LOW (ref 3.5–5.1)
Sodium: 141 mmol/L (ref 135–145)

## 2019-08-28 LAB — GLUCOSE, CAPILLARY
Glucose-Capillary: 69 mg/dL — ABNORMAL LOW (ref 70–99)
Glucose-Capillary: 119 mg/dL — ABNORMAL HIGH (ref 70–99)
Glucose-Capillary: 157 mg/dL — ABNORMAL HIGH (ref 70–99)

## 2019-08-28 MED ORDER — PRAMIPEXOLE DIHYDROCHLORIDE 1 MG PO TABS
1.0000 mg | ORAL_TABLET | Freq: Every day | ORAL | Status: DC
  Administered 2019-08-28 – 2019-08-29 (×2): 1 mg via ORAL
  Filled 2019-08-28 (×2): qty 1

## 2019-08-28 MED ORDER — POTASSIUM CHLORIDE CRYS ER 20 MEQ PO TBCR
40.0000 meq | EXTENDED_RELEASE_TABLET | Freq: Two times a day (BID) | ORAL | Status: AC
  Administered 2019-08-28 (×2): 40 meq via ORAL
  Filled 2019-08-28 (×2): qty 2

## 2019-08-28 MED ORDER — PANTOPRAZOLE SODIUM 40 MG PO TBEC: 40.0000 mg | DELAYED_RELEASE_TABLET | Freq: Every day | ORAL | Status: DC

## 2019-08-28 MED ORDER — PEG-KCL-NACL-NASULF-NA ASC-C 100 G PO SOLR: 1.0000 | Freq: Two times a day (BID) | ORAL | Status: DC

## 2019-08-28 MED ORDER — VENLAFAXINE HCL ER 75 MG PO CP24
300.0000 mg | ORAL_CAPSULE | Freq: Every day | ORAL | Status: DC
  Administered 2019-08-28 – 2019-08-29 (×2): 300 mg via ORAL
  Filled 2019-08-28 (×2): qty 4

## 2019-08-28 MED ORDER — PEG-KCL-NACL-NASULF-NA ASC-C 100 G PO SOLR
0.5000 | Freq: Once | ORAL | Status: AC
  Administered 2019-08-29: 100 g via ORAL
  Filled 2019-08-28: qty 1

## 2019-08-28 MED ORDER — PEG-KCL-NACL-NASULF-NA ASC-C 100 G PO SOLR
0.5000 | Freq: Once | ORAL | Status: AC
  Administered 2019-08-28: 100 g via ORAL
  Filled 2019-08-28: qty 1

## 2019-08-28 MED ORDER — HYDROCHLOROTHIAZIDE 12.5 MG PO CAPS
12.5000 mg | ORAL_CAPSULE | Freq: Every day | ORAL | Status: DC
  Administered 2019-08-28 – 2019-08-29 (×2): 12.5 mg via ORAL
  Filled 2019-08-28 (×2): qty 1

## 2019-08-28 MED ORDER — LOSARTAN POTASSIUM 50 MG PO TABS
100.0000 mg | ORAL_TABLET | Freq: Every day | ORAL | Status: DC
  Administered 2019-08-28 – 2019-08-29 (×2): 100 mg via ORAL
  Filled 2019-08-28 (×2): qty 2

## 2019-08-28 MED ORDER — PANTOPRAZOLE SODIUM 40 MG PO TBEC
40.0000 mg | DELAYED_RELEASE_TABLET | Freq: Two times a day (BID) | ORAL | Status: DC
  Administered 2019-08-28 – 2019-08-29 (×3): 40 mg via ORAL
  Filled 2019-08-28 (×3): qty 1

## 2019-08-28 NOTE — Progress Notes (Signed)
PROGRESS NOTE    INFANTOF Cynthia [name]  URK:[id number] DOB: 1961-01-31 DOA: 08/25/2019 PCP: Denita Lung, MD    Brief Narrative:  58 year old lady prior history of hypertension anxiety, depression, restless leg syndrome presents to ED with abdominal pain.  CT of the abdomen shows inflammatory changes with possible stricturing involving the terminal ileum and possible small bowel obstruction with changes consistent with inflammatory colitis/infectious colitis.  She was admitted to [facility] service. Surgery and GI consulted. She was started on IV antibiotics and IV steroids.  Her abdominal pain has resolved.  NG tube was taken out and she was started on clear liquid diet.  Plan for GI prep tonight and colonoscopy in the morning.  Assessment & Plan:   Principal Problem:   SBO (small bowel obstruction) (HCC) Active Problems:   GERD   Hypertension   RLS (restless legs syndrome)   SBO/ INFLAMMATORY colitis:  Abdominal pain has resolved, she is able to pass flatus.  No bowel movements though  NG tube was removed and she was started on clears.  Continue with IV antibiotics and IV steroids and plan for GI prep tonight and possible colonoscopy tomorrow. Appreciate GI and surgery recommendations.    Hypertensive urgency; Suboptimally controlled blood pressure parameters Restarted her on her home meds.  And hydralazine added to her regimen.   Hypokalemia Replaced. REPEAT IN AM.    Restless leg syndrome Continue with home medications.    GERD; Stable.    DVT prophylaxis: scd's Code Status: full code.  Family Communication: none at bedside.  Disposition Plan: pending further eval and clinical improvement.    Consultants:   GI  SURGERY   Procedures: CT abd and pelvis.    Antimicrobials: IV cipro and flagyl   Subjective: No abdominal pain, nausea or vomiting.  No bowel bowel movement yet.  Objective: Vitals:   08/28/19 0559 08/28/19 0731 08/28/19 1213 08/28/19 1300   BP: (!) 186/96 (!) 182/91 (!) 152/86 (!) 164/87  Pulse:  74 87 85  Resp:   18 17  Temp:   98 F (36.7 C) 98.2 F (36.8 C)  TempSrc:   Oral Oral  SpO2:   99% 100%  Weight:      Height:        Intake/Output Summary (Last 24 hours) at 08/28/2019 1832 Last data filed at 08/28/2019 1108 Gross per 24 hour  Intake 1100 ml  Output -  Net 1100 ml   Filed Weights   08/25/19 1408  Weight: 85.3 kg    Examination:  General exam: Alert not in any kind of distress Respiratory system: Clear to auscultation bilaterally Cardiovascular system: S1-S2 heard, regular rate rhythm  gastrointestinal system: Abdomen is soft, nontender nondistended, bowel sounds are good Central nervous system: Alert and oriented Extremities: No pedal edema, cyanosis or clubbing. Skin: No rashes or ulcers Psychiatry: .  Mood is appropriate   Data Reviewed: I have personally reviewed following labs and imaging studies  CBC: Recent Labs  Lab 08/25/19 1422  WBC 10.4  HGB 14.9  HCT 45.3  MCV 85.5  PLT 831   Basic Metabolic Panel: Recent Labs  Lab 08/25/19 1422 08/26/19 0815 08/26/19 1802 08/27/19 1833 08/28/19 0318  NA 137 136  --  137 141  K 3.2* 3.0*  --  3.8 3.1*  CL 99 101  --  104 108  CO2 26 26  --  21* 25  GLUCOSE 90 107*  --  156* 88  BUN 7 6  --  8  7  CREATININE 0.84 0.90  --  0.93 0.79  CALCIUM 9.5 8.5*  --  9.5 8.9  MG  --   --  1.8  --   --    GFR: Estimated Creatinine Clearance: 80 mL/min (by C-G formula based on SCr of 0.79 mg/dL). Liver Function Tests: Recent Labs  Lab 08/25/19 1422  AST 15  ALT 15  ALKPHOS 75  BILITOT 0.8  PROT 7.3  ALBUMIN 3.1*   Recent Labs  Lab 08/25/19 1422  LIPASE 21   No results for input(s): AMMONIA in the last 168 hours. Coagulation Profile: No results for input(s): INR, PROTIME in the last 168 hours. Cardiac Enzymes: No results for input(s): CKTOTAL, CKMB, CKMBINDEX, TROPONINI in the last 168 hours. BNP (last 3 results) No results  for input(s): PROBNP in the last 8760 hours. HbA1C: No results for input(s): HGBA1C in the last 72 hours. CBG: Recent Labs  Lab 08/27/19 0917 08/27/19 1749 08/28/19 1108 08/28/19 1212 08/28/19 1812  GLUCAP 113* 138* 69* 119* 157*   Lipid Profile: No results for input(s): CHOL, HDL, LDLCALC, TRIG, CHOLHDL, LDLDIRECT in the last 72 hours. Thyroid Function Tests: No results for input(s): TSH, T4TOTAL, FREET4, T3FREE, THYROIDAB in the last 72 hours. Anemia Panel: No results for input(s): VITAMINB12, FOLATE, FERRITIN, TIBC, IRON, RETICCTPCT in the last 72 hours. Sepsis Labs: No results for input(s): PROCALCITON, LATICACIDVEN in the last 168 hours.  Recent Results (from the past 240 hour(s))  SARS CORONAVIRUS 2 (TAT 6-24 HRS) Nasopharyngeal Nasopharyngeal Swab     Status: None   Collection Time: 08/26/19  4:56 AM   Specimen: Nasopharyngeal Swab  Result Value Ref Range Status   SARS Coronavirus 2 NEGATIVE NEGATIVE Final    Comment: (NOTE) SARS-CoV-2 target nucleic acids are NOT DETECTED. The SARS-CoV-2 RNA is generally detectable in upper and lower respiratory specimens during the acute phase of infection. Negative results do not preclude SARS-CoV-2 infection, do not rule out co-infections with other pathogens, and should not be used as the sole basis for treatment or other patient management decisions. Negative results must be combined with clinical observations, patient history, and epidemiological information. The expected result is Negative. Fact Sheet for Patients: SugarRoll.be Fact Sheet for Healthcare Providers: https://www.woods-mathews.com/ This test is not yet approved or cleared by the [place] FDA and  has been authorized for detection and/or diagnosis of SARS-CoV-2 by FDA under an Emergency Use Authorization (EUA). This EUA will remain  in effect (meaning this test can be used) for the duration of the COVID-19  declaration under Section 56 4(b)(1) of the Act, 21 U.S.C. section 360bbb-3(b)(1), unless the authorization is terminated or revoked sooner. Performed at [facility], [place] [address] [place], [place] [address]          Radiology Studies: Dg Abd Portable 1v  Result Date: 08/27/2019 CLINICAL DATA:  Initial evaluation for enteric tube placement. EXAM: PORTABLE ABDOMEN - 1 VIEW COMPARISON:  Prior radiograph from 08/26/2019. FINDINGS: Enteric tube in place with tip overlying the gastric body, side hole at/just above the level of the GE junction. Advancement by approximately 4 cm suggested. Visualized bowel gas pattern within normal limits. Visualized lungs are grossly clear. IMPRESSION: Enteric tube in place with tip overlying the gastric body, side hole at/just above the level of the GE junction. Advancement by approximately 4 cm suggested to insure adequate placement within the stomach. Electronically Signed   By: Jeannine Boga M.D.   On: 08/27/2019 00:28  Scheduled Meds: . hydrALAZINE  25 mg Oral Q8H  . hydrochlorothiazide  12.5 mg Oral Daily  . losartan  100 mg Oral Daily  . methylPREDNISolone (SOLU-MEDROL) injection  40 mg Intravenous Daily  . pantoprazole  40 mg Oral BID  . [START ON 08/29/2019] peg 3350 powder  0.5 kit Oral Once  . potassium chloride  40 mEq Oral BID  . pramipexole  1 mg Oral Daily  . sodium chloride flush  3 mL Intravenous Once  . venlafaxine XR  300 mg Oral Daily   Continuous Infusions: . sodium chloride 75 mL/hr at 08/28/19 0400     LOS: 2 days        Hosie Poisson, MD [facility] Pager [phone]  If 7PM-7AM, please contact night-coverage [email or web address] Password [email or web address] 08/28/2019, 6:32 PM

## 2019-08-28 NOTE — Progress Notes (Signed)
Patient in process of completing first part of bowel prep for tomorrow's colonoscopy.  She states she is anxious to have a resolution of her symptoms in the long term.

## 2019-08-28 NOTE — Progress Notes (Signed)
[facility] Progress Note    Since last GI note: NG pulled yesterday, clear liquids since then which she tolerated quite well.  + flatus, no real diarrhea.  Abd pains gone.  Objective: Vital signs in last 24 hours: Temp:  [97.8 F (36.6 C)-99.3 F (37.4 C)] 97.8 F (36.6 C) (10/25 0509) Pulse Rate:  [70-92] 74 (10/25 0509) Resp:  [17-20] 18 (10/25 0509) BP: (143-192)/(81-97) 186/96 (10/25 0559) SpO2:  [98 %-100 %] 100 % (10/25 0509) Last BM Date: 08/24/19 General: alert and oriented times 3 Heart: regular rate and rythm Abdomen: soft, non-tender, non-distended, normal bowel sounds   Lab Results: Recent Labs    08/25/19 1422  WBC 10.4  HGB 14.9  PLT 328  MCV 85.5   Recent Labs    08/26/19 0815 08/27/19 1833 08/28/19 0318  NA 136 137 141  K 3.0* 3.8 3.1*  CL 101 104 108  CO2 26 21* 25  GLUCOSE 107* 156* 88  BUN 6 8 7   CREATININE 0.90 0.93 0.79  CALCIUM 8.5* 9.5 8.9   Recent Labs    08/25/19 1422  PROT 7.3  ALBUMIN 3.1*  AST 15  ALT 15  ALKPHOS 75  BILITOT 0.8   Medications: Scheduled Meds: . hydrALAZINE  25 mg Oral Q8H  . methylPREDNISolone (SOLU-MEDROL) injection  40 mg Intravenous Daily  . sodium chloride flush  3 mL Intravenous Once   Continuous Infusions: . sodium chloride 75 mL/hr at 08/28/19 0400  . ciprofloxacin 400 mg (08/27/19 2028)  . metronidazole 500 mg (08/28/19 0555)   PRN Meds:.acetaminophen **OR** acetaminophen, albuterol, hydrALAZINE, morphine injection, ondansetron **OR** ondansetron (ZOFRAN) IV, phenol    Assessment/Plan: 58 y.o. female with partial SBO, inflammatory changes of SB suspicious for IBD, crohns'  Suspicious for Crohn's (1 year of intermittent vomiting, pains, diarrhea much worse recently.  CT scan consistent. Sister recently diagnosed with Crohn's. + ESR, +CRP)  Day 3 today of IV steroids.  I am going to stop her 'empiric; antibiotics that were started at admission.  I think she is ready to prep  for a colonoscoppy tomorrow and if all goes well probably safe to d/c home afterwards sometime on tapering prednisone and a f/u appt with extender or Dr. Fuller Plan in our office.  Milus Banister, MD  08/28/2019, 7:08 AM [facility] Pager [phone]

## 2019-08-28 NOTE — Plan of Care (Signed)
  Problem: Education: Goal: Knowledge of General Education information will improve Description Including pain rating scale, medication(s)/side effects and non-pharmacologic comfort measures Outcome: Progressing   

## 2019-08-29 ENCOUNTER — Encounter (HOSPITAL_COMMUNITY)
Admission: EM | Disposition: A | Discharge status: Home / Self Care | Payer: Self-pay | Source: Home / Self Care | Attending: Internal Medicine

## 2019-08-29 ENCOUNTER — Inpatient Hospital Stay (HOSPITAL_COMMUNITY): Payer: Self-pay | Admitting: Certified Registered Nurse Anesthetist

## 2019-08-29 ENCOUNTER — Encounter (HOSPITAL_COMMUNITY): Payer: Self-pay | Admitting: Certified Registered Nurse Anesthetist

## 2019-08-29 DIAGNOSIS — K529 Noninfective gastroenteritis and colitis, unspecified: Secondary | ICD-10-CM

## 2019-08-29 DIAGNOSIS — K621 Rectal polyp: Secondary | ICD-10-CM

## 2019-08-29 DIAGNOSIS — K635 Polyp of colon: Secondary | ICD-10-CM

## 2019-08-29 HISTORY — PX: COLONOSCOPY WITH PROPOFOL: SHX5780

## 2019-08-29 HISTORY — PX: POLYPECTOMY: SHX5525

## 2019-08-29 HISTORY — PX: BIOPSY: SHX5522

## 2019-08-29 LAB — BASIC METABOLIC PANEL
Anion gap: 10 (ref 5–15)
BUN: 5 mg/dL — ABNORMAL LOW (ref 6–20)
CO2: 22 mmol/L (ref 22–32)
Calcium: 9.3 mg/dL (ref 8.9–10.3)
Chloride: 108 mmol/L (ref 98–111)
Creatinine, Ser: 0.91 mg/dL (ref 0.44–1.00)
GFR calc Af Amer: >60 mL/min (ref >60)
GFR calc non Af Amer: >60 mL/min (ref >60)
Glucose, Bld: 93 mg/dL (ref 70–99)
Potassium: 3.7 mmol/L (ref 3.5–5.1)
Sodium: 140 mmol/L (ref 135–145)

## 2019-08-29 LAB — GLUCOSE, CAPILLARY
Glucose-Capillary: 92 mg/dL (ref 70–99)
Glucose-Capillary: 110 mg/dL — ABNORMAL HIGH (ref 70–99)

## 2019-08-29 LAB — CBC
HCT: 47.9 % — ABNORMAL HIGH (ref 36.0–46.0)
Hemoglobin: 15.7 g/dL — ABNORMAL HIGH (ref 12.0–15.0)
MCH: 27.9 pg (ref 26.0–34.0)
MCHC: 32.8 g/dL (ref 30.0–36.0)
MCV: 85.1 fL (ref 80.0–100.0)
Platelets: 412 10*3/uL — ABNORMAL HIGH (ref 150–400)
RBC: 5.63 MIL/uL — ABNORMAL HIGH (ref 3.87–5.11)
RDW: 13.4 % (ref 11.5–15.5)
WBC: 10.4 10*3/uL (ref 4.0–10.5)
nRBC: 0 % (ref 0.0–0.2)

## 2019-08-29 LAB — HEPATITIS B SURFACE ANTIGEN: Hepatitis B Surface Ag: NONREACTIVE

## 2019-08-29 SURGERY — COLONOSCOPY WITH PROPOFOL
Anesthesia: General

## 2019-08-29 MED ORDER — PREDNISONE 10 MG PO TABS: 15.0000 mg | ORAL_TABLET | Freq: Every day | ORAL | 0 refills | Status: DC

## 2019-08-29 MED ORDER — PREDNISONE 10 MG PO TABS: 30.0000 mg | ORAL_TABLET | Freq: Every day | ORAL | 0 refills | Status: DC

## 2019-08-29 MED ORDER — SODIUM CHLORIDE 0.9 % IV SOLN: INTRAVENOUS | Status: DC

## 2019-08-29 MED ORDER — PROPOFOL 10 MG/ML IV BOLUS
INTRAVENOUS | Status: DC | PRN
  Administered 2019-08-29 (×3): 10 mg via INTRAVENOUS

## 2019-08-29 MED ORDER — PREDNISONE 10 MG PO TABS: 35.0000 mg | ORAL_TABLET | Freq: Every day | ORAL | 0 refills | Status: DC

## 2019-08-29 MED ORDER — PREDNISONE 10 MG PO TABS: 20.0000 mg | ORAL_TABLET | Freq: Every day | ORAL | 0 refills | Status: DC

## 2019-08-29 MED ORDER — PREDNISONE 20 MG PO TABS: 40.0000 mg | ORAL_TABLET | Freq: Every day | ORAL | 0 refills | Status: DC

## 2019-08-29 MED ORDER — PREDNISONE 10 MG PO TABS: 5.0000 mg | ORAL_TABLET | Freq: Every day | ORAL | 0 refills | Status: DC

## 2019-08-29 MED ORDER — LACTATED RINGERS IV SOLN
INTRAVENOUS | Status: DC | PRN
  Administered 2019-08-29: 08:00:00 via INTRAVENOUS

## 2019-08-29 MED ORDER — LACTATED RINGERS IV SOLN
INTRAVENOUS | Status: DC
  Administered 2019-08-29: 08:00:00 via INTRAVENOUS

## 2019-08-29 MED ORDER — PREDNISONE 20 MG PO TABS
40.0000 mg | ORAL_TABLET | Freq: Every day | ORAL | Status: DC
  Administered 2019-08-29: 12:00:00 40 mg via ORAL
  Filled 2019-08-29: qty 2

## 2019-08-29 MED ORDER — PREDNISONE 10 MG PO TABS: 10.0000 mg | ORAL_TABLET | Freq: Every day | ORAL | 0 refills | Status: DC

## 2019-08-29 MED ORDER — PROPOFOL 500 MG/50ML IV EMUL
INTRAVENOUS | Status: DC | PRN
  Administered 2019-08-29: 175 ug/kg/min via INTRAVENOUS
  Administered 2019-08-29: 10:00:00 via INTRAVENOUS
  Administered 2019-08-29: 100 ug/kg/min via INTRAVENOUS

## 2019-08-29 MED ORDER — PREDNISONE 10 MG PO TABS: 25.0000 mg | ORAL_TABLET | Freq: Every day | ORAL | 0 refills | Status: DC

## 2019-08-29 MED ORDER — INFLUENZA VAC SPLIT QUAD 0.5 ML IM SUSY
0.5000 mL | PREFILLED_SYRINGE | INTRAMUSCULAR | Status: AC
  Administered 2019-08-29: 0.5 mL via INTRAMUSCULAR
  Filled 2019-08-29: qty 0.5

## 2019-08-29 SURGICAL SUPPLY — 22 items

## 2019-08-29 NOTE — Anesthesia Procedure Notes (Signed)
Procedure Name: MAC Date/Time: 08/29/2019 8:35 AM Performed by: Harden Mo, CRNA Pre-anesthesia Checklist: Patient identified, Emergency Drugs available, Suction available and Patient being monitored Patient Re-evaluated:Patient Re-evaluated prior to induction Oxygen Delivery Method: Simple face mask Preoxygenation: Pre-oxygenation with 100% oxygen Induction Type: IV induction Placement Confirmation: positive ETCO2 and breath sounds checked- equal and bilateral Dental Injury: Teeth and Oropharynx as per pre-operative assessment

## 2019-08-29 NOTE — Op Note (Signed)
[facility] Patient Name: Cynthia [name] Procedure Date : 08/29/2019 MRN: [id number] Attending MD: Gerrit Heck , MD Date of Birth: 06-25-1961 CSN: [id number] Age: 58 Admit Type: Inpatient Procedure:                Colonoscopy Indications:              Generalized abdominal pain, Diarrhea, Abnormal CT                            of the GI tract                           58 yo female presents with partial small bowel                            obstruction, with admission CT notable for                            inflammatory changes in the distal ileum. She has a                            family history of sister with recently diagnosed                            Crohns Disease. Providers:                Gerrit Heck, MD, Angus Seller, Waynette Buttery., Technician, Trixie Deis, CRNA Referring MD:              Medicines:                Monitored Anesthesia Care Complications:            No immediate complications. Estimated Blood Loss:     Estimated blood loss was minimal. Procedure:                Pre-Anesthesia Assessment:                           - Prior to the procedure, a History and Physical                            was performed, and patient medications and                            allergies were reviewed. The patient's tolerance of                            previous anesthesia was also reviewed. The risks                            and benefits of the procedure and the sedation                            options and risks  were discussed with the patient.                            All questions were answered, and informed consent                            was obtained. Prior Anticoagulants: The patient has                            taken no previous anticoagulant or antiplatelet                            agents. ASA Grade Assessment: II - A patient with                            mild systemic disease. After  reviewing the risks                            and benefits, the patient was deemed in                            satisfactory condition to undergo the procedure.                           After obtaining informed consent, the colonoscope                            was passed under direct vision. Throughout the                            procedure, the patient's blood pressure, pulse, and                            oxygen saturations were monitored continuously. The                            [id number]-H190DL ([id number]) Olympus pediatric colonoscope                            was introduced through the anus and advanced to the                            the ileocecal valve. The CF-[id number] ([id number])                            Olympus colonoscope was introduced through the anus                            and advanced to the the terminal ileum. The                            colonoscopy was technically difficult and complex  due to significant looping. Successful completion                            of the procedure was aided by changing the patient                            to a supine position, using manual pressure and                            withdrawing the scope and replacing with the adult                            colonoscope. The patient tolerated the procedure                            well. The quality of the bowel preparation was                            adequate. The terminal ileum, ileocecal valve,                            appendiceal orifice, and rectum were photographed. Scope In: 8:40:53 AM Scope Out: 9:49:41 AM Scope Withdrawal Time: 0 hours 18 minutes 3 seconds  Total Procedure Duration: 1 hour 8 minutes 48 seconds  Findings:      The perianal and digital rectal examinations were normal.      The sigmoid colon and ascending colon revealed significantly excessive       looping. Advancing the scope required changing the patient to a supine        position, using manual pressure and withdrawing the scope and replacing       the pediatric colonoscope with the adult colonoscope.      Two sessile polyps were found in the rectum and recto-sigmoid colon. The       polyps were 3 to 4 mm in size. These polyps were removed with a cold       snare. Resection and retrieval were complete. Estimated blood loss was       minimal.      An area of mildly vascular-pattern-decreased mucosa was found in the       descending colon, in the transverse colon, in the ascending colon and in       the cecum. Biopsies were taken with a cold forceps for histology.       Estimated blood loss was minimal. The sigmoid and rectum were otherwise       normal appearing, with additional biopsies obtained from these areas.      Retroflexion in the rectum was not performed due to anatomy.      Inflammation, moderate in severity and characterized by congestion       (edema), erythema and shallow ulcerations was found in the terminal       ileum. Advanced the colonoscope approximately 10 cm into the ileum, with       noted luminal narrowing. Biopsies were taken with a cold forceps for       histology. Estimated blood loss was minimal. Impression:               - There was  significant looping of the colon.                           - Two 3 to 4 mm polyps in the rectum and at the                            recto-sigmoid colon, removed with a cold snare.                            Resected and retrieved.                           - Vascular-pattern-decreased mucosa in the                            descending colon, in the transverse colon, in the                            ascending colon and in the cecum. Biopsied.                           - Ileitis. Biopsied. Clinical presentation and                            endoscopic appearance highly suspicious for Crohns                            Disease. Recommendation:           - Return patient to hospital ward for possible                             discharge same day.                           - Full liquid diet today. If tolerating liquid diet                            and PO medications, should be ok for discharge from                            a GI standpoint.                           - Plan for low residue diet as an outpatient.                           - Start Prednisone 40 mg PO daily x2 weeks, then                            wean as follow:                           - 35 mg x1 week.                           -  30 mg x1 week.                           - 25 mg x1 week.                           - 20 mg x1 week.                           - 15 mg x1 week.                           - 10 mg x1 week.                           - 5 mg x1 week then stop.                           - Await pathology results.                           - Return to GI clinic at appointment to be                            scheduled. Will coordinate this appointment with                            our clinic. Procedure Code(s):        --- Professional ---                           [id number], Colonoscopy, flexible; with removal of                            tumor(s), polyp(s), or other lesion(s) by snare                            technique                           45380, 29, Colonoscopy, flexible; with biopsy,                            single or multiple Diagnosis Code(s):        --- Professional ---                           K62.1, Rectal polyp                           K63.5, Polyp of colon                           K63.89, Other specified diseases of intestine                           K52.9, Noninfective gastroenteritis and colitis,  unspecified                           R10.84, Generalized abdominal pain                           R19.7, Diarrhea, unspecified                           R93.3, Abnormal findings on diagnostic imaging of                            other parts of digestive  tract CPT copyright 2019 [employer]. All rights reserved. The codes documented in this report are preliminary and upon coder review may  be revised to meet current compliance requirements. Gerrit Heck, MD 08/29/2019 10:09:59 AM Number of Addenda: 0

## 2019-08-29 NOTE — Anesthesia Postprocedure Evaluation (Signed)
Anesthesia Post Note  Patient: Cynthia [name]  Procedure(s) Performed: COLONOSCOPY WITH PROPOFOL (N/A ) BIOPSY POLYPECTOMY     Patient location during evaluation: PACU Anesthesia Type: General Level of consciousness: awake Pain management: pain level controlled Vital Signs Assessment: post-procedure vital signs reviewed and stable Respiratory status: spontaneous breathing Cardiovascular status: stable Postop Assessment: no apparent nausea or vomiting Anesthetic complications: no    Last Vitals:  Vitals:   08/29/19 1044 08/29/19 1411  BP: (!) 150/83 (!) 165/90  Pulse: 73 73  Resp: 17 20  Temp: 36.4 C 36.5 C  SpO2: 100% 98%    Last Pain:  Vitals:   08/29/19 1411  TempSrc: Oral  PainSc:                  Praise Stennett

## 2019-08-29 NOTE — Transfer of Care (Signed)
Immediate Anesthesia Transfer of Care Note  Patient: Cynthia [name]  Procedure(s) Performed: COLONOSCOPY WITH PROPOFOL (N/A ) BIOPSY POLYPECTOMY  Patient Location: Endoscopy Unit  Anesthesia Type:MAC  Level of Consciousness: awake, alert  and oriented  Airway & Oxygen Therapy: Patient Spontanous Breathing and Patient connected to nasal cannula oxygen  Post-op Assessment: Report given to RN, Post -op Vital signs reviewed and stable and Patient moving all extremities X 4  Post vital signs: Reviewed and stable  Last Vitals:  Vitals Value Taken Time  BP 171/94 08/29/19 0958  Temp 36.1 C 08/29/19 0958  Pulse 90 08/29/19 1000  Resp 13 08/29/19 1000  SpO2 100 % 08/29/19 1000  Vitals shown include unvalidated device data.  Last Pain:  Vitals:   08/29/19 0958  TempSrc: Temporal  PainSc: 0-No pain      Patients Stated Pain Goal: 1 (46/65/99 3570)  Complications: No apparent anesthesia complications

## 2019-08-29 NOTE — Progress Notes (Signed)
Patient currently downstairs awaiting colonoscopy.  Per GI, she is tolerating her liquids and her pain has resolved.  From a surgical standpoint, her diet may be advanced as tolerates and further plan of care coordination per GI for further management of presumed crohn's disease.  No further surgical needs at this time.  Cynthia [name] 8:30 AM 08/29/2019

## 2019-08-29 NOTE — Progress Notes (Signed)
Pt discharged to home accomp by sister.  DC instructions reviewed and pt given copy.  Pt had all labs drawn prior to DC.  Pt understands how to take the prednisone Rx which will be picked up tomorrow at [facility].  No further questions about home self care.

## 2019-08-29 NOTE — Anesthesia Preprocedure Evaluation (Addendum)
Anesthesia Evaluation  Patient identified by MRN, date of birth, ID band Patient awake    Reviewed: Allergy & Precautions, NPO status   Airway Mallampati: II  TM Distance: >3 FB     Dental   Pulmonary Current Smoker,    breath sounds clear to auscultation       Cardiovascular hypertension,  Rhythm:Regular Rate:Normal     Neuro/Psych    GI/Hepatic Neg liver ROS, hiatal hernia, GERD  ,  Endo/Other  negative endocrine ROS  Renal/GU negative Renal ROS     Musculoskeletal   Abdominal   Peds  Hematology   Anesthesia Other Findings   Reproductive/Obstetrics                             Anesthesia Physical Anesthesia Plan  ASA: III  Anesthesia Plan: MAC   Post-op Pain Management:    Induction: Intravenous  PONV Risk Score and Plan: 2 and Ondansetron, Dexamethasone and Midazolam  Airway Management Planned: Nasal Cannula and Nasal CPAP  Additional Equipment:   Intra-op Plan:   Post-operative Plan:   Informed Consent:     Dental advisory given  Plan Discussed with: CRNA and Anesthesiologist  Anesthesia Plan Comments:        Anesthesia Quick Evaluation

## 2019-08-29 NOTE — Progress Notes (Signed)
Pt back from Endoscopy lab s/p colonoscopy, sister at bedside.

## 2019-08-30 ENCOUNTER — Telehealth: Payer: Self-pay

## 2019-08-30 ENCOUNTER — Encounter (HOSPITAL_COMMUNITY): Payer: Self-pay | Admitting: Gastroenterology

## 2019-08-30 LAB — SURGICAL PATHOLOGY

## 2019-08-30 NOTE — Telephone Encounter (Signed)
-----   Message from [place], DO sent at 08/29/2019 10:34 AM EDT ----- Can you please assist in f/u appt for MS or APP for this patient in 2-3 weeks. Thank you! ----- Message ----- From: [name], MD Sent: 08/29/2019  10:31 AM EDT To: Irving Copas., MD, #  Thanks. Office follow up with me or APP in 2-3 weeks.  ----- Message ----- From: Lavena Bullion, DO Sent: 08/29/2019  10:10 AM EDT To: [name], MD, #  Colonoscopy completed this AM. Significant amount of looping, but eventually got into the TI. Looks like Crohns ileitis. Biopsies pending. As long as she is tolerating PO intake and PO meds, should be ok for discharge later today with recommended f/u appt in 2-3 weeks. Transitioning Solumedrol to Prednisone, and will try to obtain screening labs (Hep B, TPMT, Quant Gold) prior to discharge.   Thanks. [employer]

## 2019-08-30 NOTE — Addendum Note (Signed)
Addendum  created 08/30/19 1200 by Belinda Block, MD   Clinical Note Signed

## 2019-08-30 NOTE — Telephone Encounter (Signed)
Left message for patient to call back  

## 2019-08-31 ENCOUNTER — Telehealth: Payer: Self-pay

## 2019-08-31 DIAGNOSIS — K529 Noninfective gastroenteritis and colitis, unspecified: Secondary | ICD-10-CM

## 2019-08-31 LAB — QUANTIFERON-TB GOLD PLUS (RQFGPL)
QuantiFERON Mitogen Value: 0.33 IU/mL
QuantiFERON Nil Value: 0.04 IU/mL
QuantiFERON TB1 Ag Value: 0.04 IU/mL
QuantiFERON TB2 Ag Value: 0.06 IU/mL

## 2019-08-31 LAB — QUANTIFERON-TB GOLD PLUS: QuantiFERON-TB Gold Plus: UNDETERMINED — AB

## 2019-08-31 LAB — HEPATITIS B SURFACE ANTIBODY, QUANTITATIVE: Hep B S AB Quant (Post): 4 m[IU]/mL — ABNORMAL LOW (ref >9.9)

## 2019-08-31 NOTE — Telephone Encounter (Signed)
Notified patient of lab results. Patient requested to have her Hep B and Quantiferon Gold done on Friday at her PCP. I also scheduled the patient for her 2 week follow up with Dr. Fuller Plan on 10/12/2019.

## 2019-08-31 NOTE — Telephone Encounter (Signed)
-----   Message from [place], DO sent at 08/31/2019  4:32 PM EDT ----- The additional labs were notable for: - Hep B s Ab was 4. This is less than the threshold for immunity of 9.9. Will plan on Hep B vaccine series - Quant Gold was "indeterminate". Recommend repeat Percival Spanish

## 2019-09-01 NOTE — Telephone Encounter (Signed)
Patient has follow up with Dr. Fuller Plan for 09/15/19

## 2019-09-02 ENCOUNTER — Other Ambulatory Visit: Payer: Self-pay

## 2019-09-02 ENCOUNTER — Ambulatory Visit (INDEPENDENT_AMBULATORY_CARE_PROVIDER_SITE_OTHER): Payer: Self-pay | Admitting: Family Medicine

## 2019-09-02 ENCOUNTER — Encounter: Payer: Self-pay | Admitting: Family Medicine

## 2019-09-02 VITALS — BP 172/96 | HR 88 | Temp 97.3°F | Wt 189.6 lb

## 2019-09-02 DIAGNOSIS — K529 Noninfective gastroenteritis and colitis, unspecified: Secondary | ICD-10-CM

## 2019-09-02 DIAGNOSIS — Z23 Encounter for immunization: Secondary | ICD-10-CM

## 2019-09-02 DIAGNOSIS — R7612 Nonspecific reaction to cell mediated immunity measurement of gamma interferon antigen response without active tuberculosis: Secondary | ICD-10-CM

## 2019-09-02 LAB — THIOPURINE METHYLTRANSFERASE (TPMT), RBC: TPMT Activity:: 20.2 Units/mL RBC

## 2019-09-02 MED ORDER — PREDNISONE 10 MG PO TABS: ORAL_TABLET | ORAL | 0 refills | Status: DC

## 2019-09-02 NOTE — Progress Notes (Signed)
   Subjective:    Patient ID: Cynthia [name], female    DOB: September 10, 1961, 58 y.o.   MRN: [id number]  HPI Is here for posthospitalization follow-up.  She was admitted on October 22 and sent home on the 26.  She was diagnosed with a small bowel obstruction and possible Crohn's disease per CT scan.  She was sent home on prednisone with a tapering Dosepak.  She does have an appointment set up to see GI on November 11.  While in the hospital they did hold her blood pressure meds that she was taking at home.  Since being at home she has gone back on her regular dosing.  While also in the hospital she did have equivocal QuantiFERON test as well as indeterminate hepatitis B.  Presently she is having no abdominal pain, nausea, vomiting.   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 Fuller Plan concerning this.  Prescription was rewritten for that.  She will discuss proper taper with Dr. Fuller Plan.  Continue on present blood pressure medication as per prior to hospital.  Follow-up here based on that.

## 2019-09-03 ENCOUNTER — Other Ambulatory Visit: Payer: Self-pay | Admitting: Family Medicine

## 2019-09-03 DIAGNOSIS — G2581 Restless legs syndrome: Secondary | ICD-10-CM

## 2019-09-03 DIAGNOSIS — F341 Dysthymic disorder: Secondary | ICD-10-CM

## 2019-09-03 DIAGNOSIS — G43909 Migraine, unspecified, not intractable, without status migrainosus: Secondary | ICD-10-CM

## 2019-09-05 NOTE — Telephone Encounter (Signed)
[facility] is requesting to fill pt mirapex and effexor. Please advise [facility]

## 2019-09-06 LAB — QUANTIFERON-TB GOLD PLUS
QuantiFERON Mitogen Value: 0.57 IU/mL
QuantiFERON Nil Value: 0.03 IU/mL
QuantiFERON TB1 Ag Value: 0.06 IU/mL
QuantiFERON TB2 Ag Value: 0.04 IU/mL
QuantiFERON-TB Gold Plus: NEGATIVE

## 2019-09-07 NOTE — Discharge Summary (Signed)
Physician Discharge Summary  Cynthia [name] YDX:[id number] DOB: [birth year] DOA: 08/25/2019  PCP: Denita Lung, MD  Admit date: 08/25/2019 Discharge date: 08/29/2019  Admitted From: Home.  Disposition:  HOme.   Recommendations for Outpatient Follow-up:  1. Follow up with PCP in 1-2 weeks 2. Please obtain BMP/CBC in one week Please follow up with gastroenterology as recommended.   Discharge Condition:stable.  CODE STATUS:*full code.  Diet recommendation: Heart Healthy  Brief/Interim Summary: 58 year old lady prior history of hypertension anxiety, depression, restless leg syndrome presents to ED with abdominal pain. CT of the abdomen shows inflammatory changes with possible stricturing involving the terminal ileum and possible small bowel obstruction with changes consistent with inflammatory colitis/infectious colitis. She was admitted to [facility] service. Surgery and GI consulted. She was started on IV antibiotics and IV steroids.  Her abdominal pain has resolved.  NG tube was taken out and she was started on clear liquid diet.  underwent  GI prep and colonoscopy in the morning showing two polyps in the rectum and rectosigmoid colon and Vascular-pattern-decreased mucosa in the descending colon, in the transverse colon, in the ascending colon and in the cecum. Biopsied. - Ileitis. Biopsied. Clinical presentation and endoscopic appearance highly suspicious for Crohns Disease. Recommended prednisone taper and outpatient follow up with gastroenterology on discharge.    Discharge Diagnoses:  Principal Problem:   SBO (small bowel obstruction) (HCC) Active Problems:   GERD   Hypertension   RLS (restless legs syndrome)   Colitis   Rectal polyp  SBO/ INFLAMMATORY colitis:  Abdominal pain has resolved, she is able to pass flatus.  NG tube was removed and she was started on clears. Colonoscopy showing two polyps in the rectum and rectosigmoid colon and Vascular-pattern-decreased  mucosa in the descending colon, in the transverse colon, in the ascending colon and in the cecum. Biopsied. - Ileitis. Biopsied. Clinical presentation and endoscopic appearance highly suspicious for Crohns Disease. Recommended prednisone taper and outpatient follow up with gastroenterology on discharge.    Hypertensive urgency; Better controlled.  Restarted her on her home meds.     Hypokalemia Replaced.    Restless leg syndrome Continue with home medications.    GERD; Stable.   Discharge Instructions  Discharge Instructions    Diet - low sodium heart healthy   Complete by: As directed    Discharge instructions   Complete by: As directed    Follow up with GI as recommended.     Allergies as of 08/29/2019      Reactions   Iodine    REACTION: in eye gtts      Medication List    STOP taking these medications   amoxicillin 875 MG tablet Commonly known as: AMOXIL   losartan-hydrochlorothiazide 50-12.5 MG tablet Commonly known as: HYZAAR   nitrofurantoin (macrocrystal-monohydrate) 100 MG capsule Commonly known as: Macrobid   ondansetron 4 MG tablet Commonly known as: Zofran     TAKE these medications   albuterol 108 (90 Base) MCG/ACT inhaler Commonly known as: VENTOLIN HFA Inhale 2 puffs into the lungs every 6 (six) hours as needed for wheezing or shortness of breath.   esomeprazole 40 MG capsule Commonly known as: NEXIUM TAKE ONE (1) CAPSULE BY MOUTH 2 TIMES DAILY What changed:   how much to take  how to take this  when to take this   hydrochlorothiazide 12.5 MG capsule Commonly known as: MICROZIDE Take 1 capsule (12.5 mg total) by mouth daily.   losartan 100 MG tablet Commonly known as: COZAAR  Take 1 tablet (100 mg total) by mouth daily. Take 1/2 tablet by mouth daily What changed: how much to take   REFRESH OP Place 1 drop into both eyes daily as needed. Reported on 05/08/2016      Follow-up Information    Denita Lung, MD.  Schedule an appointment as soon as possible for a visit in 1 week(s).   Specialty: Family Medicine Contact information: [address] [place] [address] [phone]          Allergies  Allergen Reactions  . Iodine     REACTION: in eye gtts    Consultations:  Gastroenterology.    Procedures/Studies: Ct Abdomen Pelvis W Contrast  Result Date: 08/26/2019 CLINICAL DATA:  Abdominal pain, unintentional weight loss EXAM: CT ABDOMEN AND PELVIS WITH CONTRAST TECHNIQUE: Multidetector CT imaging of the abdomen and pelvis was performed using the standard protocol following bolus administration of intravenous contrast. CONTRAST:  130m OMNIPAQUE IOHEXOL 300 MG/ML  SOLN COMPARISON:  June 18, 2011 FINDINGS: Lower chest: The visualized heart size within normal limits. No pericardial fluid/thickening. No hiatal hernia. The visualized portions of the lungs are clear. Hepatobiliary: The liver is normal in density without focal abnormality.The main portal vein is patent. No evidence of calcified gallstones, gallbladder wall thickening or biliary dilatation. Pancreas: Unremarkable. No pancreatic ductal dilatation or surrounding inflammatory changes. Spleen: Normal in size without focal abnormality. Adrenals/Urinary Tract: Both adrenal glands appear normal. The kidneys and collecting system appear normal without evidence of urinary tract calculus or hydronephrosis. Bladder is unremarkable. Stomach/Bowel: The stomach is normal in appearance. There is mildly dilated fluid and air-filled loops of proximal small bowel measuring up to 3.7 cm down to the level several segments of distal ileal loops which appear to have segments of wall thickening with surrounding mesenteric fat stranding changes and areas of narrowing/stricturing. There is mesenteric hyperemia with focal area of narrowing/stricturing seen at the terminal ileum. There are scattered surrounding small lymph nodes present. There is mild fat  stranding changes seen adjacent to a focal segment of sigmoid colon likely from the adjacent ileal loop inflammatory changes. Vascular/Lymphatic: There are no enlarged mesenteric, retroperitoneal, or pelvic lymph nodes. No significant vascular findings are present. Reproductive: The patient is status post hysterectomy. Other: No evidence of abdominal wall mass or hernia. Musculoskeletal: No acute or significant osseous findings. IMPRESSION: 1. A several focal segments of distal ileum with surrounding inflammatory changes and short segment areas of narrowing/stricturing to the level of the terminal ileum. This likely represents inflammatory and/or infectious colitis. 2. Mild proximal partial small-bowel obstruction to the level of the distal ileal loops. 3. Reactive small lymph nodes seen within the right lower quadrant. Electronically Signed   By: BPrudencio PairM.D.   On: 08/26/2019 02:48   Dg Abd Portable 1v  Result Date: 08/27/2019 CLINICAL DATA:  Initial evaluation for enteric tube placement. EXAM: PORTABLE ABDOMEN - 1 VIEW COMPARISON:  Prior radiograph from 08/26/2019. FINDINGS: Enteric tube in place with tip overlying the gastric body, side hole at/just above the level of the GE junction. Advancement by approximately 4 cm suggested. Visualized bowel gas pattern within normal limits. Visualized lungs are grossly clear. IMPRESSION: Enteric tube in place with tip overlying the gastric body, side hole at/just above the level of the GE junction. Advancement by approximately 4 cm suggested to insure adequate placement within the stomach. Electronically Signed   By: BJeannine BogaM.D.   On: 08/27/2019 00:28   Dg Abd Portable 1 View  Result Date: 08/26/2019 CLINICAL DATA:  NG tube placement. EXAM: PORTABLE ABDOMEN - 1 VIEW COMPARISON:  CT abdomen/pelvis, same date. FINDINGS: The NG tube is coursing down the esophagus and into the stomach. The tip is in the body region. The visualized lungs are clear.  Scattered air-filled small bowel loops noted in the upper abdomen. No free air. IMPRESSION: NG tube in good position. Electronically Signed   By: Marijo Sanes M.D.   On: 08/26/2019 07:18       Subjective: No new complaints.   Discharge Exam: Vitals:   08/29/19 1044 08/29/19 1411  BP: (!) 150/83 (!) 165/90  Pulse: 73 73  Resp: 17 20  Temp: 97.6 F (36.4 C) 97.7 F (36.5 C)  SpO2: 100% 98%   Vitals:   08/29/19 1008 08/29/19 1018 08/29/19 1044 08/29/19 1411  BP: (!) 176/99 (!) 163/91 (!) 150/83 (!) 165/90  Pulse: 73 74 73 73  Resp: 17 15 17 20   Temp:   97.6 F (36.4 C) 97.7 F (36.5 C)  TempSrc:   Oral Oral  SpO2: 99% 99% 100% 98%  Weight:      Height:        General: Pt is alert, awake, not in acute distress Cardiovascular: RRR, S1/S2 +, no rubs, no gallops Respiratory: CTA bilaterally, no wheezing, no rhonchi Abdominal: Soft, NT, ND, bowel sounds + Extremities: no edema, no cyanosis    The results of significant diagnostics from this hospitalization (including imaging, microbiology, ancillary and laboratory) are listed below for reference.     Microbiology: No results found for this or any previous visit (from the past 240 hour(s)).   Labs: BNP (last 3 results) No results for input(s): BNP in the last 8760 hours. Basic Metabolic Panel: No results for input(s): NA, K, CL, CO2, GLUCOSE, BUN, CREATININE, CALCIUM, MG, PHOS in the last 168 hours. Liver Function Tests: No results for input(s): AST, ALT, ALKPHOS, BILITOT, PROT, ALBUMIN in the last 168 hours. No results for input(s): LIPASE, AMYLASE in the last 168 hours. No results for input(s): AMMONIA in the last 168 hours. CBC: No results for input(s): WBC, NEUTROABS, HGB, HCT, MCV, PLT in the last 168 hours. Cardiac Enzymes: No results for input(s): CKTOTAL, CKMB, CKMBINDEX, TROPONINI in the last 168 hours. BNP: Invalid input(s): POCBNP CBG: No results for input(s): GLUCAP in the last 168  hours. D-Dimer No results for input(s): DDIMER in the last 72 hours. Hgb A1c No results for input(s): HGBA1C in the last 72 hours. Lipid Profile No results for input(s): CHOL, HDL, LDLCALC, TRIG, CHOLHDL, LDLDIRECT in the last 72 hours. Thyroid function studies No results for input(s): TSH, T4TOTAL, T3FREE, THYROIDAB in the last 72 hours.  Invalid input(s): FREET3 Anemia work up No results for input(s): VITAMINB12, FOLATE, FERRITIN, TIBC, IRON, RETICCTPCT in the last 72 hours. Urinalysis    Component Value Date/Time   COLORURINE AMBER (A) 08/25/2019 2020   APPEARANCEUR HAZY (A) 08/25/2019 2020   LABSPEC 1.027 08/25/2019 2020   LABSPEC 1.025 05/13/2018 1018   PHURINE 5.0 08/25/2019 2020   GLUCOSEU NEGATIVE 08/25/2019 2020   HGBUR NEGATIVE 08/25/2019 2020   BILIRUBINUR NEGATIVE 08/25/2019 2020   BILIRUBINUR negative 05/13/2018 1018   BILIRUBINUR n 12/04/2011 1014   KETONESUR 20 (A) 08/25/2019 2020   PROTEINUR 100 (A) 08/25/2019 2020   UROBILINOGEN negative 12/04/2011 1014   NITRITE NEGATIVE 08/25/2019 2020   LEUKOCYTESUR NEGATIVE 08/25/2019 2020   Sepsis Labs Invalid input(s): PROCALCITONIN,  WBC,  LACTICIDVEN Microbiology No results found for  this or any previous visit (from the past 240 hour(s)).   Time coordinating discharge: 31 minutes  SIGNED:   Hosie Poisson, MD  [facility] 09/07/2019, 7:45 AM Pager   If 7PM-7AM, please contact night-coverage [email or web address] Password [email or web address]

## 2019-09-15 ENCOUNTER — Encounter: Payer: Self-pay | Admitting: Gastroenterology

## 2019-09-15 ENCOUNTER — Ambulatory Visit (INDEPENDENT_AMBULATORY_CARE_PROVIDER_SITE_OTHER): Payer: Self-pay | Admitting: Gastroenterology

## 2019-09-15 ENCOUNTER — Other Ambulatory Visit: Payer: Self-pay

## 2019-09-15 VITALS — BP 130/80 | HR 86 | Ht 64.0 in | Wt 188.0 lb

## 2019-09-15 DIAGNOSIS — K50019 Crohn's disease of small intestine with unspecified complications: Secondary | ICD-10-CM

## 2019-09-15 DIAGNOSIS — R933 Abnormal findings on diagnostic imaging of other parts of digestive tract: Secondary | ICD-10-CM

## 2019-09-15 MED ORDER — MERCAPTOPURINE 50 MG PO TABS: 100.0000 mg | ORAL_TABLET | Freq: Every day | ORAL | 1 refills | Status: DC

## 2019-09-15 NOTE — Progress Notes (Signed)
    History of Present Illness: This is a 58 year old female here following hospitalization for newly diagnosed Crohn's ileitis. Elevated ESR and CRP. She has a 1 year history of intermittent abdominal pain, 40 pound weight loss, intermittent nausea and vomiting, frequent diarrhea.  Colonoscopy findings and biopsies, blood work and CT AP reviewed in detail.  She has received 1 dose of hepatitis B vaccination and repeat QuantiFERON gold was negative. TPMT was in the normal range. Placed on Prednisone taper at discharge.  She has had substantial improvement in symptoms with almost complete resolution of GI complaints.  She has modified her diet to about 5 smaller meals per day.  Her stools have been more formed.  Prior abdominal pain resolved.  No episodes of nausea and vomiting.  She feels her weight has stabilized.  She notes mild epigastric discomfort that began today and has improved with Tums.  CT AP 08/26/2019 IMPRESSION: 1. A several focal segments of distal ileum with surrounding inflammatory changes and short segment areas of narrowing/stricturing to the level of the terminal ileum. This likely represents inflammatory and/or infectious colitis. 2. Mild proximal partial small-bowel obstruction to the level of the distal ileal loops. 3. Reactive small lymph nodes seen within the right lower quadrant.  Current Medications, Allergies, Past Medical History, Past Surgical History, Family History and Social History were reviewed in [employer] record.   Physical Exam: General: Well developed, well nourished, no acute distress Head: Normocephalic and atraumatic Eyes:  sclerae anicteric, EOMI Ears: Normal auditory acuity Mouth: No deformity or lesions Lungs: Clear throughout to auscultation Heart: Regular rate and rhythm; no murmurs, rubs or bruits Abdomen: Soft, non tender and non distended. No masses, hepatosplenomegaly or hernias noted. Normal Bowel sounds Rectal:  Deferred to colonoscopy  Musculoskeletal: Symmetrical with no gross deformities  Pulses:  Normal pulses noted Extremities: No clubbing, cyanosis, edema or deformities noted Neurological: Alert oriented x 4, grossly nonfocal Psychological:  Alert and cooperative. Normal mood and affect   Assessment and Recommendations:  1.  Newly diagnosed Crohn's ileitis.  Substantial symptomatic improvement on prednisone taper, currently at 30 mg daily.  We discussed options of several biologics and 6-MP.  After discussion of options, risks, benefits and alternatives, the patient would like to proceed with 6-MP therapy.  Continue prednisone taper as prescribed until reaching 15 mg daily and then maintain 15 mg daily.  Plan to overlap prednisone and 6-MP therapy for 2 to 3 months if symptoms remain under very good control.  Complete second dose of hepatitis B vaccine.  Begin 6-MP 100 mg daily.  CBC, CMP lipase in 2 weeks.  She is advised to contact us for any recurrent symptoms.  REV in 3-4 weeks.   2.  GERD.  Mild epigastric discomfort.  Continue Tums as needed.  If symptoms persist increase Nexium to 40 mg p.o. twice daily.  If this is not effective she is advised to contact us.

## 2019-09-15 NOTE — Patient Instructions (Signed)
Your provider has requested that you go to the basement level for lab work on 09/28/19 Press "B" on the elevator. The lab is located at the first door on the left as you exit the elevator.   We have sent the following medications to your pharmacy for you to pick up at your convenience: 76mp  Taper your prednisone to 15 mg and continue on that dose daily  you have a follow up appointment in 3 weeks with Amy Trellis Paganini  It was a pleasure to see you today!  Dr Fuller Plan

## 2019-09-16 ENCOUNTER — Telehealth: Payer: Self-pay

## 2019-09-16 NOTE — Telephone Encounter (Signed)
According to our computer patient has Medicaid insurance pending. Cash price at [facility] for the 6MP is $199.30. On [facility]  It is $29.44 at [facility]. I have left Selest a message to call me back so I can explain this to her to see if she wants me to re-send it to [facility].

## 2019-09-21 NOTE — Telephone Encounter (Signed)
Left another detailed message to call me back to discuss medicines.

## 2019-09-28 ENCOUNTER — Telehealth: Payer: Self-pay | Admitting: Gastroenterology

## 2019-09-28 NOTE — Telephone Encounter (Signed)
Patient notified okay to wait until Monday.

## 2019-09-28 NOTE — Telephone Encounter (Signed)
Left Cynthia [name] a detailed message to call me back to discuss the medicine.

## 2019-10-03 ENCOUNTER — Other Ambulatory Visit: Payer: Self-pay | Admitting: Family Medicine

## 2019-10-03 ENCOUNTER — Other Ambulatory Visit (INDEPENDENT_AMBULATORY_CARE_PROVIDER_SITE_OTHER): Payer: Self-pay

## 2019-10-03 DIAGNOSIS — K529 Noninfective gastroenteritis and colitis, unspecified: Secondary | ICD-10-CM

## 2019-10-03 DIAGNOSIS — R933 Abnormal findings on diagnostic imaging of other parts of digestive tract: Secondary | ICD-10-CM

## 2019-10-03 DIAGNOSIS — K50019 Crohn's disease of small intestine with unspecified complications: Secondary | ICD-10-CM

## 2019-10-03 LAB — CBC WITH DIFFERENTIAL/PLATELET
Basophils Absolute: 0 10*3/uL (ref 0.0–0.1)
Basophils Relative: 0.3 % (ref 0.0–3.0)
Eosinophils Absolute: 0.1 10*3/uL (ref 0.0–0.7)
Eosinophils Relative: 0.8 % (ref 0.0–5.0)
HCT: 43.3 % (ref 36.0–46.0)
Hemoglobin: 14.4 g/dL (ref 12.0–15.0)
Lymphocytes Relative: 15.9 % (ref 12.0–46.0)
Lymphs Abs: 2.2 10*3/uL (ref 0.7–4.0)
MCHC: 33.2 g/dL (ref 30.0–36.0)
MCV: 85.2 fl (ref 78.0–100.0)
Monocytes Absolute: 0.8 10*3/uL (ref 0.1–1.0)
Monocytes Relative: 5.9 % (ref 3.0–12.0)
Neutro Abs: 10.5 10*3/uL — ABNORMAL HIGH (ref 1.4–7.7)
Neutrophils Relative %: 77.1 % — ABNORMAL HIGH (ref 43.0–77.0)
Platelets: 304 10*3/uL (ref 150.0–400.0)
RBC: 5.09 Mil/uL (ref 3.87–5.11)
RDW: 14.8 % (ref 11.5–15.5)
WBC: 13.6 10*3/uL — ABNORMAL HIGH (ref 4.0–10.5)

## 2019-10-03 LAB — COMPREHENSIVE METABOLIC PANEL
ALT: 10 U/L (ref 0–35)
AST: 10 U/L (ref 0–37)
Albumin: 3.5 g/dL (ref 3.5–5.2)
Alkaline Phosphatase: 66 U/L (ref 39–117)
BUN: 14 mg/dL (ref 6–23)
CO2: 28 mEq/L (ref 19–32)
Calcium: 9.2 mg/dL (ref 8.4–10.5)
Chloride: 99 mEq/L (ref 96–112)
Creatinine, Ser: 0.94 mg/dL (ref 0.40–1.20)
GFR: 60.76 mL/min (ref >60.00)
Glucose, Bld: 138 mg/dL — ABNORMAL HIGH (ref 70–99)
Potassium: 3.2 mEq/L — ABNORMAL LOW (ref 3.5–5.1)
Sodium: 136 mEq/L (ref 135–145)
Total Bilirubin: 1 mg/dL (ref 0.2–1.2)
Total Protein: 7.2 g/dL (ref 6.0–8.3)

## 2019-10-03 LAB — LIPASE: Lipase: 13 U/L (ref 11.0–59.0)

## 2019-10-03 NOTE — Telephone Encounter (Signed)
Pt was advised Cynthia [name] 

## 2019-10-03 NOTE — Telephone Encounter (Signed)
[facility] is requesting to fill pt prednisone. Please advise . [place]

## 2019-10-03 NOTE — Telephone Encounter (Signed)
Have her follow-up with Dr. Fuller Plan concerning the amount of prednisone.

## 2019-10-04 ENCOUNTER — Other Ambulatory Visit: Payer: Self-pay

## 2019-10-04 ENCOUNTER — Other Ambulatory Visit (INDEPENDENT_AMBULATORY_CARE_PROVIDER_SITE_OTHER): Payer: Self-pay

## 2019-10-04 DIAGNOSIS — E876 Hypokalemia: Secondary | ICD-10-CM

## 2019-10-04 DIAGNOSIS — Z23 Encounter for immunization: Secondary | ICD-10-CM

## 2019-10-05 ENCOUNTER — Encounter: Payer: Self-pay | Admitting: Physician Assistant

## 2019-10-05 ENCOUNTER — Ambulatory Visit (INDEPENDENT_AMBULATORY_CARE_PROVIDER_SITE_OTHER): Payer: Self-pay | Admitting: Physician Assistant

## 2019-10-05 VITALS — BP 142/86 | HR 82 | Temp 98.0°F | Ht 63.5 in | Wt 185.0 lb

## 2019-10-05 DIAGNOSIS — K50012 Crohn's disease of small intestine with intestinal obstruction: Secondary | ICD-10-CM

## 2019-10-05 DIAGNOSIS — K529 Noninfective gastroenteritis and colitis, unspecified: Secondary | ICD-10-CM

## 2019-10-05 DIAGNOSIS — K50019 Crohn's disease of small intestine with unspecified complications: Secondary | ICD-10-CM

## 2019-10-05 HISTORY — DX: Crohn's disease of small intestine with intestinal obstruction: K50.012

## 2019-10-05 MED ORDER — MERCAPTOPURINE 50 MG PO TABS: 100.0000 mg | ORAL_TABLET | Freq: Every day | ORAL | 1 refills | Status: DC

## 2019-10-05 MED ORDER — PREDNISONE 10 MG PO TABS: ORAL_TABLET | ORAL | 0 refills | Status: AC

## 2019-10-05 NOTE — Progress Notes (Signed)
Subjective:    Patient ID: Cynthia [name], female    DOB: 07-03-1961, 58 y.o.   MRN: [id number]  HPI [name] is a pleasant 58 year old white female, established with Dr. Fuller Plan who was recently diagnosed with Crohn's ileitis while she was hospitalized with a partial small bowel obstruction in October 2020. She underwent colonoscopy on 08/29/2019 with finding of significant looping of the colon, 2 small polyps in the rectum and recto sigmoid region were removed,.  She was noted to have decrease in vascular pattern of the a sending transverse and descending colon and active ileitis suspicious for Crohn's.  Biopsies returned showing inflammation consistent with Crohn's ileitis.  The remainder of the biopsies were otherwise normal and without inflammatory change.  The polyps were tubular adenomas. She has initiated hepatitis B vaccination. QuantiFERON gold negative. TPMT within normal range. She was started on prednisone 60 mg p.o. daily.  She had follow-up with Dr. Fuller Plan on 09/15/2019, and had had improvement in symptoms.  She was to gradually taper prednisone to 15 mg until she was seen in follow-up.  She was also to initiate 6-MP 100 mg daily. Patient lost her job earlier this year and subsequently has no insurance.  She had been unable to afford the 6-MP.  Our office has been assisting her with that and earlier this week it was confirmed that she could obtain this at a reasonable price through her pharmacy with good Rx.  She plans to pick this up today. She is currently down to 30 mg of prednisone per day.  She admits that she skipped a few doses around Thanksgiving.  She is very nonspecific but says she made some bad choices around that time and had several days at the last part of last week where she was acutely ill with nausea and vomiting some diarrhea and says she wound up on the couch between Friday and Sunday without getting up much at all.  She had apparently vomited on herself and had some  incontinence of stool. Since then she has been feeling better denies any fever or chills.  She says she has been pushing her fluids and eating very light.  No significant abdominal pain has had some mild cramping.  Now having 1-2 loose bowel movements per day, no blood.  Review of Systems Pertinent positive and negative review of systems were noted in the above HPI section.  All other review of systems was otherwise negative.  Outpatient Encounter Medications as of 10/05/2019  Medication Sig  . losartan-hydrochlorothiazide (HYZAAR) 50-12.5 MG tablet Take 1 tablet by mouth daily.  . Polyvinyl Alcohol-Povidone (REFRESH OP) Place 1 drop into both eyes daily as needed. Reported on 05/08/2016  . pramipexole (MIRAPEX) 1 MG tablet Take 1 tablet by mouth daily  . venlafaxine XR (EFFEXOR-XR) 150 MG 24 hr capsule Take 2 capsules by mouth daily  . [DISCONTINUED] predniSONE (DELTASONE) 10 MG tablet Take 3 tablets/day (Patient taking differently: 10 mg. Take 3 tablets/day  Is currently on a prednisone taper)  . esomeprazole (NEXIUM) 40 MG capsule TAKE ONE (1) CAPSULE BY MOUTH 2 TIMES DAILY (Patient taking differently: Take 40 mg by mouth 2 (two) times daily before a meal. TAKE ONE (1) tablet once to twice a day as needed)  . mercaptopurine (PURINETHOL) 50 MG tablet Take 2 tablets (100 mg total) by mouth daily. Give on an empty stomach 1 hour before or 2 hours after meals. Caution: Chemotherapy.  . predniSONE (DELTASONE) 10 MG tablet Take 3 tablets (30 mg  total) by mouth daily with breakfast for 7 days, THEN 2 tablets (20 mg total) daily with breakfast for 28 days, THEN 1.5 tablets (15 mg total) daily with breakfast for 28 days, THEN 1 tablet (10 mg total) daily with breakfast for 28 days.  . [DISCONTINUED] albuterol (PROVENTIL HFA;VENTOLIN HFA) 108 (90 Base) MCG/ACT inhaler Inhale 2 puffs into the lungs every 6 (six) hours as needed for wheezing or shortness of breath.  . [DISCONTINUED] mercaptopurine (PURINETHOL)  50 MG tablet Take 2 tablets (100 mg total) by mouth daily. Give on an empty stomach 1 hour before or 2 hours after meals. Caution: Chemotherapy.   No facility-administered encounter medications on file as of 10/05/2019.    Allergies  Allergen Reactions  . Iodine     REACTION: in eye gtts   Patient Active Problem List   Diagnosis Date Noted  . Crohn's ileitis, with intestinal obstruction ([place]) 10/05/2019  . Colitis   . Rectal polyp   . SBO (small bowel obstruction) ([place]) 08/26/2019  . 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  . RLS (restless legs syndrome) 09/25/2014  . Current smoker 09/19/2011  . Migraine headache 09/19/2011  . Hypertension 09/19/2011  . Obesity (BMI 30-39.9) 09/19/2011  . GERD 03/17/2008  . MVP (mitral valve prolapse) 03/17/2008  . HIATAL HERNIA 05/03/2002   Social History   Socioeconomic History  . Marital status: Widowed    Spouse name: Not on file  . Number of children: 1  . Years of education: Not on file  . Highest education level: Not on file  Occupational History  . Occupation: Designer, television/film set: [employer]  . Financial resource strain: Not on file  . Food insecurity    Worry: Not on file    Inability: Not on file  . Transportation needs    Medical: Not on file    Non-medical: Not on file  Tobacco Use  . Smoking status: Current Every Day Smoker    Packs/day: 0.50    Types: Cigarettes    Last attempt to quit: 12/07/2011    Years since quitting: 7.8  . Smokeless tobacco: Never Used  Substance and Sexual Activity  . Alcohol use: Yes    Comment: socially  . Drug use: Yes    Types: Cocaine    Comment: socially  . Sexual activity: Not Currently  Lifestyle  . Physical activity    Days per week: Not on file    Minutes per session: Not on file  . Stress: Not on file  Relationships  . Social Herbalist on phone: Not on file    Gets together: Not on file    Attends religious service: Not on file    Active member of  club or organization: Not on file    Attends meetings of clubs or organizations: Not on file    Relationship status: Not on file  . Intimate partner violence    Fear of current or ex partner: Not on file    Emotionally abused: Not on file    Physically abused: Not on file    Forced sexual activity: Not on file  Other Topics Concern  . Not on file  Social History Narrative   Lives with boyfriend who she states is an alcoholic    Ms. Schimming's family history includes Clotting disorder in her father; Crohn's disease in her sister; Diabetes in her mother.      Objective:    Vitals:   10/05/19  1140  BP: (!) 142/86  Pulse: 82  Temp: 98 F (36.7 C)    Physical Exam Well-developed well-nourished older white female in no acute distress.  Height, Weight, 185 BMI 32.2  HEENT; nontraumatic normocephalic, EOMI, PER R LA, sclera anicteric. Oropharynx; not examined/mask/Covid Neck; supple, no JVD Cardiovascular; regular rate and rhythm with S1-S2, no murmur rub or gallop Pulmonary; Clear bilaterally Abdomen; soft, mild tenderness in the right lower quadrant and mid abdomen, nondistended, no palpable mass or hepatosplenomegaly, bowel sounds are active Rectal; not done Skin; benign exam, no jaundice rash or appreciable lesions Extremities; no clubbing cyanosis or edema skin warm and dry Neuro/Psych; alert and oriented x4, grossly nonfocal mood and affect appropriate       Assessment & Plan:   #63 58 year old white female with new diagnosis of Crohn's ileitis, presenting with partial small bowel obstruction October 2020. She is currently improving on slow steroid taper, currently at 30 mg p.o. daily. She was to initiate 6-MP 100 mg p.o. daily at time of last office visit but has not obtained this as yet due to cost.  That has been worked out and she plans to pick up this week.  As she has not started 6-MP we will continue with very slow steroid taper.  Instructed to continue 30 mg  p.o. daily for another week, then decrease to 20 mg daily x4 weeks, then 15 mg daily x4 weeks, then 10 mg daily x4 weeks.  She will need CBC, c-Met and lipase in 2 weeks. We discussed importance of adhering to her medication regimen carefully, and focusing on her healthy habits at this time. Plan follow-up office visit with Dr. Fuller Plan in 1 month.  She was advised to call in the interim should she have any worsening of symptoms.  Jakell Trusty Genia Harold PA-C 10/05/2019   Cc: Denita Lung, MD

## 2019-10-05 NOTE — Telephone Encounter (Signed)
This has been sent

## 2019-10-05 NOTE — Progress Notes (Signed)
Reviewed and agree with management plan.  Makaylen Thieme T. Taison Celani, MD FACG [facility]  

## 2019-10-05 NOTE — Telephone Encounter (Signed)
Patient came in to see Amy  [facility] and said to please sent her 6MP to the [facility].

## 2019-10-05 NOTE — Patient Instructions (Addendum)
If you are age 60 or older, your body mass index should be between 23-30. Your Body mass index is 32.26 kg/m. If this is out of the aforementioned range listed, please consider follow up with your Primary Care Provider.  If you are age 7 or younger, your body mass index should be between 19-25. Your Body mass index is 32.26 kg/m. If this is out of the aformentioned range listed, please consider follow up with your Primary Care Provider.   Your provider has requested that you go to the basement level for lab work . Press "B" on the elevator. The lab is located at the first door on the left as you exit the elevator. 10/19/19  We have sent the following medications to your pharmacy for you to pick up at your convenience: Prednisone Taper 6MP  Follow up with Dr. Fuller Plan on 11/11/19 at 10:50 am.  Thank you for choosing me and [facility].   Amy Esterwood, PA-C

## 2019-10-12 ENCOUNTER — Ambulatory Visit: Payer: Self-pay | Admitting: Gastroenterology

## 2019-11-11 ENCOUNTER — Ambulatory Visit: Payer: Self-pay | Admitting: Gastroenterology

## 2019-12-05 ENCOUNTER — Other Ambulatory Visit: Payer: Self-pay

## 2019-12-05 ENCOUNTER — Other Ambulatory Visit (INDEPENDENT_AMBULATORY_CARE_PROVIDER_SITE_OTHER): Payer: Self-pay

## 2019-12-05 ENCOUNTER — Telehealth: Payer: Self-pay | Admitting: Physician Assistant

## 2019-12-05 DIAGNOSIS — K50019 Crohn's disease of small intestine with unspecified complications: Secondary | ICD-10-CM

## 2019-12-05 DIAGNOSIS — K529 Noninfective gastroenteritis and colitis, unspecified: Secondary | ICD-10-CM

## 2019-12-05 LAB — CBC WITH DIFFERENTIAL/PLATELET
Basophils Absolute: 0.1 10*3/uL (ref 0.0–0.1)
Basophils Relative: 0.5 % (ref 0.0–3.0)
Eosinophils Absolute: 0.1 10*3/uL (ref 0.0–0.7)
Eosinophils Relative: 0.6 % (ref 0.0–5.0)
HCT: 44.6 % (ref 36.0–46.0)
Hemoglobin: 15.1 g/dL — ABNORMAL HIGH (ref 12.0–15.0)
Lymphocytes Relative: 20.9 % (ref 12.0–46.0)
Lymphs Abs: 2.3 10*3/uL (ref 0.7–4.0)
MCHC: 33.9 g/dL (ref 30.0–36.0)
MCV: 91.2 fl (ref 78.0–100.0)
Monocytes Absolute: 0.8 10*3/uL (ref 0.1–1.0)
Monocytes Relative: 7.1 % (ref 3.0–12.0)
Neutro Abs: 7.7 10*3/uL (ref 1.4–7.7)
Neutrophils Relative %: 70.9 % (ref 43.0–77.0)
Platelets: 393 10*3/uL (ref 150.0–400.0)
RBC: 4.9 Mil/uL (ref 3.87–5.11)
RDW: 16.9 % — ABNORMAL HIGH (ref 11.5–15.5)
WBC: 10.8 10*3/uL — ABNORMAL HIGH (ref 4.0–10.5)

## 2019-12-05 LAB — COMPREHENSIVE METABOLIC PANEL
ALT: 9 U/L (ref 0–35)
AST: 10 U/L (ref 0–37)
Albumin: 3.9 g/dL (ref 3.5–5.2)
Alkaline Phosphatase: 68 U/L (ref 39–117)
BUN: 13 mg/dL (ref 6–23)
CO2: 25 mEq/L (ref 19–32)
Calcium: 9.5 mg/dL (ref 8.4–10.5)
Chloride: 104 mEq/L (ref 96–112)
Creatinine, Ser: 0.86 mg/dL (ref 0.40–1.20)
GFR: 67.29 mL/min (ref >60.00)
Glucose, Bld: 103 mg/dL — ABNORMAL HIGH (ref 70–99)
Potassium: 3.7 mEq/L (ref 3.5–5.1)
Sodium: 141 mEq/L (ref 135–145)
Total Bilirubin: 0.7 mg/dL (ref 0.2–1.2)
Total Protein: 7.6 g/dL (ref 6.0–8.3)

## 2019-12-05 LAB — LIPASE: Lipase: 7 U/L — ABNORMAL LOW (ref 11.0–59.0)

## 2019-12-05 MED ORDER — ONDANSETRON HCL 4 MG PO TABS: 4.0000 mg | ORAL_TABLET | Freq: Four times a day (QID) | ORAL | 1 refills | Status: DC | PRN

## 2019-12-05 MED ORDER — MERCAPTOPURINE 50 MG PO TABS: 100.0000 mg | ORAL_TABLET | Freq: Every day | ORAL | 1 refills | Status: DC

## 2019-12-05 NOTE — Telephone Encounter (Signed)
Patient is scheduled to be seen tomorrow in clinic by Dr Fuller Plan. States she has done well until a week ago when she "ran out of" Mercaptopurine. Today she has vomited 3 times. States she is not nauseous until right when she vomits. Stomach hurts some. Very soft unformed bowel movements. Usually 3 to 4 a day. No blood. Since she ran out of the 6MP she did not reduce her prednisone to 15 mg. She is taking prednisone 20 mg. Afebrile. No changes in her diet. She is coming for the labs she was to get in January.

## 2019-12-05 NOTE — Telephone Encounter (Signed)
Spoke with the patient. She does want the Zofran. She will sip clear liquids including Gatorade or Pedialyte. Keep the appointment tomorrow.

## 2019-12-05 NOTE — Telephone Encounter (Signed)
Pt has been experiencing vomiting and feels sick to her stomach since Friday. She thinks that she is having a flare up. She stopped taking Mercaptopurine about a week ago because of financial constrains. She has an appt tomorrow but would like some advise today. Pls call her.

## 2019-12-05 NOTE — Telephone Encounter (Signed)
Ok to send rx fro Zofran 4 mg q 6 hours prn for nausea, clear liquids until seen in office tomorrow

## 2019-12-06 ENCOUNTER — Ambulatory Visit (INDEPENDENT_AMBULATORY_CARE_PROVIDER_SITE_OTHER): Payer: Self-pay | Admitting: Gastroenterology

## 2019-12-06 ENCOUNTER — Encounter: Payer: Self-pay | Admitting: Gastroenterology

## 2019-12-06 VITALS — BP 138/82 | HR 80 | Ht 63.5 in | Wt 188.0 lb

## 2019-12-06 DIAGNOSIS — K50019 Crohn's disease of small intestine with unspecified complications: Secondary | ICD-10-CM

## 2019-12-06 NOTE — Patient Instructions (Addendum)
Increase your prednisone to 50 mg daily x 1 week, reduce to 40 mg daily x 1 week, then reduce to 30 mg daily and remain on this the 30 mg dose.   Remain on a clear liquid diet and advance to full liquids and soft food as tolerated.   Start back on mercaptopurine daily. You have this prescription at home.   Thank you for choosing me and [facility].  Pricilla Riffle. Dagoberto Ligas., MD., [facility]

## 2019-12-06 NOTE — Progress Notes (Signed)
    History of Present Illness: This is a 59 year old female with Crohn's ileitis diagnosed in October 2020.  She was hospitalized for partial small bowel obstruction in October 2020.  Last seen in early December by Nicoletta Ba PA-C.  She was improving on a slow prednisone taper.  She relates she lost her job, lost health insurance and was unable to afford 6-MP.  She took 6-MP for about 5 weeks and discontinued it about 1 week ago when it ran out.  Over the past few days she has had worsening crampy lower abdominal pain associated with nausea diarrhea and occasional vomiting.  Since beginning Zofran her vomiting has abated and she is tolerating liquids.  Current Medications, Allergies, Past Medical History, Past Surgical History, Family History and Social History were reviewed in [employer] record.   Physical Exam: General: Well developed, well nourished, no acute distress Head: Normocephalic and atraumatic Eyes:  sclerae anicteric, EOMI Ears: Normal auditory acuity Mouth: No deformity or lesions Lungs: Clear throughout to auscultation Heart: Regular rate and rhythm; no murmurs, rubs or bruits Abdomen: Soft, mild lower abdominal tenderness without rebound or guarding and non distended. No masses, hepatosplenomegaly or hernias noted. Normal Bowel sounds Rectal: Not done Musculoskeletal: Symmetrical with no gross deformities  Pulses:  Normal pulses noted Extremities: No clubbing, cyanosis, edema or deformities noted Neurological: Alert oriented x 4, grossly nonfocal Psychological:  Alert and cooperative. Normal mood and affect   Assessment and Recommendations:  1. Crohn's ileitis flare with suspected partial obstruction.  Blood work from yesterday reviewed.  Starting today increase prednisone to 50 mg daily for 1 week, then 40 mg daily for 1 week, then resume 30 mg daily.  Restart 6-MP 100 mg daily today.  Clear liquid diet for 2 days, then advance to full liquids  and then soft diet as tolerated.  Patient is advised to call if she is unable to take adequate p.o. fluids, abdominal pain worsens, N/V worsens, diarrhea worsens, or other symptoms develop. REV in 2 weeks with Amy Esterwood, PA-C and in 6 weeks with me.  2.  GERD.  Continue Nexium 40 mg p.o. once or twice daily to maintain control of symptoms.  Follow standard antireflux measures.  3.  Personal history of adenomatous colon polyps.  7-year interval surveillance colonoscopy is recommended in October 2027.

## 2019-12-20 ENCOUNTER — Encounter: Payer: Self-pay | Admitting: Physician Assistant

## 2019-12-20 ENCOUNTER — Ambulatory Visit (INDEPENDENT_AMBULATORY_CARE_PROVIDER_SITE_OTHER): Payer: Self-pay | Admitting: Physician Assistant

## 2019-12-20 VITALS — BP 140/96 | HR 100 | Temp 97.9°F | Ht 63.5 in | Wt 190.0 lb

## 2019-12-20 DIAGNOSIS — K50019 Crohn's disease of small intestine with unspecified complications: Secondary | ICD-10-CM

## 2019-12-20 MED ORDER — PROMETHAZINE HCL 25 MG PO TABS: 25.0000 mg | ORAL_TABLET | Freq: Four times a day (QID) | ORAL | 1 refills | Status: DC | PRN

## 2019-12-20 NOTE — Patient Instructions (Signed)
If you are age 61 or older, your body mass index should be between 23-30. Your Body mass index is 33.13 kg/m. If this is out of the aforementioned range listed, please consider follow up with your Primary Care Provider.  If you are age 48 or younger, your body mass index should be between 19-25. Your Body mass index is 33.13 kg/m. If this is out of the aformentioned range listed, please consider follow up with your Primary Care Provider.   We have sent the following medications to your pharmacy for you to pick up at your convenience: Promethazine 25 mg  Continue Mercaptopurine at same dose.  Continue 30 mg of Prednisone for two weeks, then decrease to 20 mg daily.  The office will call you regarding patient assistance for Biologic.  Thank you for choosing me and [facility].   Amy Esterwood, PA-C

## 2019-12-20 NOTE — Progress Notes (Signed)
Subjective:    Patient ID: Cynthia [name], female    DOB: Mar 31, 1961, 59 y.o.   MRN: [id number]  HPI Cynthia [name] is a pleasant 59 year old white female, established with Dr. Fuller Plan, who comes in for 2-week follow-up today for Crohn's ileitis.  Patient was initially diagnosed in October 2020 when she was hospitalized with a small bowel obstruction.  She has required steroids since.  She had been started on 6-MP about 3 months ago but could not afford to stay on it so had not taken continuously.  She was seen in follow-up on 12/06/2019 by Dr. Fuller Plan, at that time with complaints of increased crampy lower abdominal pain and episodes of nausea and vomiting with some diarrhea.  She had been on a steroid taper.  She was restarted on 6-MP at 100 mg p.o. daily and prednisone was increased to 50 mg daily for 1 week, then 40 mg daily for 1 week then 30 mg. She says she was feeling better on higher doses of steroids, she decreased to 30 mg daily over this past weekend.  She has been taking the 6-MP as directed.  Last week she had felt okay without any significant abdominal pain however yesterday she felt poorly all day and had about 12 hours worth of intermittent nausea and vomiting.  She says her abdomen feels sore from vomiting.  She has not had any associated fever, no diarrhea. Last night she was finally able to keep some broth down and this morning was able to keep some chicken soup down.  She feels somewhat bloated and uncomfortable but no significant pain.  She had a fairly normal semiloose bowel movement today. Unfortunately patient lost her job within this past year and currently does not have any insurance. She has completed her hepatitis B vaccinations, and QuantiFERON gold was - October 2020. She is requesting an alternative medicine for nausea and says Zofran has not been helping much with these episodes.  Review of Systems Pertinent positive and negative review of systems were noted in the above HPI  section.  All other review of systems was otherwise negative.  Outpatient Encounter Medications as of 12/20/2019  Medication Sig  . esomeprazole (NEXIUM) 40 MG capsule TAKE ONE (1) CAPSULE BY MOUTH 2 TIMES DAILY (Patient taking differently: Take 40 mg by mouth 2 (two) times daily before a meal. TAKE ONE (1) tablet once to twice a day as needed)  . losartan-hydrochlorothiazide (HYZAAR) 50-12.5 MG tablet Take 1 tablet by mouth daily.  . mercaptopurine (PURINETHOL) 50 MG tablet Take 2 tablets (100 mg total) by mouth daily. Give on an empty stomach 1 hour before or 2 hours after meals. Caution: Chemotherapy.  . ondansetron (ZOFRAN) 4 MG tablet Take 1 tablet (4 mg total) by mouth every 6 (six) hours as needed for nausea or vomiting. Take 1 now.  . Polyvinyl Alcohol-Povidone (REFRESH OP) Place 1 drop into both eyes daily as needed. Reported on 05/08/2016  . pramipexole (MIRAPEX) 1 MG tablet Take 1 tablet by mouth daily  . predniSONE (DELTASONE) 10 MG tablet Take 3 tablets (30 mg total) by mouth daily with breakfast for 7 days, THEN 2 tablets (20 mg total) daily with breakfast for 28 days, THEN 1.5 tablets (15 mg total) daily with breakfast for 28 days, THEN 1 tablet (10 mg total) daily with breakfast for 28 days.  Marland Kitchen venlafaxine XR (EFFEXOR-XR) 150 MG 24 hr capsule Take 2 capsules by mouth daily  . promethazine (PHENERGAN) 25 MG tablet Take 1 tablet (  25 mg total) by mouth every 6 (six) hours as needed for nausea or vomiting.   No facility-administered encounter medications on file as of 12/20/2019.   Allergies  Allergen Reactions  . Iodine     REACTION: in eye gtts   Patient Active Problem List   Diagnosis Date Noted  . Crohn's ileitis, with intestinal obstruction ([place]) 10/05/2019  . Colitis   . Rectal polyp   . SBO (small bowel obstruction) ([place]) 08/26/2019  . 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  . RLS (restless legs syndrome) 09/25/2014  . Current smoker 09/19/2011  . Migraine headache 09/19/2011  .  Hypertension 09/19/2011  . Obesity (BMI 30-39.9) 09/19/2011  . GERD 03/17/2008  . MVP (mitral valve prolapse) 03/17/2008  . HIATAL HERNIA 05/03/2002   Social History   Socioeconomic History  . Marital status: Widowed    Spouse name: Not on file  . Number of children: 1  . Years of education: Not on file  . Highest education level: Not on file  Occupational History  . Occupation: Designer, television/film set: [employer]  Tobacco Use  . Smoking status: Current Every Day Smoker    Packs/day: 0.50    Types: Cigarettes    Last attempt to quit: 12/07/2011    Years since quitting: 8.0  . Smokeless tobacco: Never Used  Substance and Sexual Activity  . Alcohol use: Yes    Comment: socially  . Drug use: Yes    Types: Cocaine    Comment: socially  . Sexual activity: Not Currently  Other Topics Concern  . Not on file  Social History Narrative   Lives with boyfriend who she states is an alcoholic   Social Determinants of Radio broadcast assistant Strain:   . Difficulty of Paying Living Expenses: Not on file  Food Insecurity:   . Worried About Charity fundraiser in the Last Year: Not on file  . Ran Out of Food in the Last Year: Not on file  Transportation Needs:   . Lack of Transportation (Medical): Not on file  . Lack of Transportation (Non-Medical): Not on file  Physical Activity:   . Days of Exercise per Week: Not on file  . Minutes of Exercise per Session: Not on file  Stress:   . Feeling of Stress : Not on file  Social Connections:   . Frequency of Communication with Friends and Family: Not on file  . Frequency of Social Gatherings with Friends and Family: Not on file  . Attends Religious Services: Not on file  . Active Member of Clubs or Organizations: Not on file  . Attends Archivist Meetings: Not on file  . Marital Status: Not on file  Intimate Partner Violence:   . Fear of Current or Ex-Partner: Not on file  . Emotionally Abused: Not on file  .  Physically Abused: Not on file  . Sexually Abused: Not on file    Cynthia [name]'s family history includes Clotting disorder in her father; Crohn's disease in her sister; Diabetes in her mother.      Objective:    Vitals:   12/20/19 1330  BP: (!) 140/96  Pulse: 100  Temp: 97.9 F (36.6 C)    Physical Exam. Well-developed well-nourished older WF in no acute distress.  Height, Weight,190 BMI 33.1  HEENT; nontraumatic normocephalic, EOMI, PER R LA, sclera anicteric. Oropharynx;not examined  Neck; supple, no JVD Cardiovascular; regular rate and rhythm with S1-S2, no murmur rub or gallop Pulmonary; Clear  bilaterally Abdomen; soft, , nondistended ,she is tender in the RLQ, no palpable mass or hepatosplenomegaly, bowel sounds are active Rectal; Skin; benign exam, no jaundice rash or appreciable lesions Extremities; no clubbing cyanosis or edema skin warm and dry Neuro/Psych; alert and oriented x4, grossly nonfocal mood and affect appropriate       Assessment & Plan:   #[age] year old white female with relatively new diagnosis of Crohn's ileitis, hospitalized with small bowel obstruction October 2020.  She has had some improvement in symptoms with prednisone, however we have been unable to taper off.  When she was seen 2 weeks ago required increase in prednisone again. She is now on 6-MP 100 mg p.o. daily just over the past couple of weeks-cost had previously been an issue.  Patient had multiple episodes of nausea and vomiting yesterday, able to keep down clear liquids last night and today.  She has persistent right lower quadrant tenderness.  I am concerned she is continuing to have intermittent low-grade obstructive symptoms.  Biologic is indicated, in addition to 6-MP, however patient has no insurance and that is going to be a significant challenge.  Plan; continue prednisone at 30 mg p.o. daily, will slow taper and keep her on 30 mg for 2 weeks then if symptoms stable decrease  to 20 mg p.o. daily.  I am not optimistic that we will get her off of steroids without addition of biologic. Continue 6-MP 100 mg p.o. daily Have sent prescription for Phenergan 25 mg every 6 hours as needed for nausea and vomiting. Will investigate any options for patient assistance with Remicade or Humira as first choices, or possibly Stelara.  We also discussed COVID-19 vaccination and encouraged her to get vaccinated as soon as she is eligible.  If this could be accomplished prior to starting biologic, that would be desirable.[place] Nurse Annely Sliva PA-C 12/20/2019   Cc: Denita Lung, MD

## 2019-12-21 NOTE — Progress Notes (Signed)
Reviewed and agree with management plan.  Eshaal Duby T. Jaianna Nicoll, MD FACG [facility]  

## 2020-01-18 ENCOUNTER — Encounter: Payer: Self-pay | Admitting: Gastroenterology

## 2020-01-18 ENCOUNTER — Ambulatory Visit (INDEPENDENT_AMBULATORY_CARE_PROVIDER_SITE_OTHER): Payer: Self-pay | Admitting: Gastroenterology

## 2020-01-18 VITALS — BP 152/80 | HR 95 | Temp 97.2°F | Ht 63.5 in | Wt 189.0 lb

## 2020-01-18 DIAGNOSIS — K50019 Crohn's disease of small intestine with unspecified complications: Secondary | ICD-10-CM

## 2020-01-18 MED ORDER — PREDNISONE 5 MG PO TABS: ORAL_TABLET | ORAL | 0 refills | Status: DC

## 2020-01-18 NOTE — Progress Notes (Signed)
    History of Present Illness: This is a 59 year old female with Crohn's ileitis diagnosed in October 2020.  She has been managed with 6-MP and prednisone.  Completing a slow prednisone taper ran our of prednisone 2 days ago. Starting a biologic was discussed with the patient in detail by AE.  Unfortunately she does not have health insurance.  It has been difficult for her to afford 6-MP and her other medications.  She relates intermittent abdominal discomfort and frequent gurgling.  She is following a very light diet and occasionally 1 gastrointestinal symptoms seem active she will skip a meal.  Her weight has remained stable.  We are currently exploring Humira patient assistance program.  Initial QuantiFERON gold was indeterminate however repeat QuantiFERON gold was negative.  Hep B surface antigen negative.  Hep B surface antibody was under the threshold for immunity.  Current Medications, Allergies, Past Medical History, Past Surgical History, Family History and Social History were reviewed in [employer] record.   Physical Exam: General: Well developed, well nourished, no acute distress Head: Normocephalic and atraumatic Eyes:  sclerae anicteric, EOMI Ears: Normal auditory acuity Mouth: Not examined, mask on during Covid-19 pandemic Lungs: Clear throughout to auscultation Heart: Regular rate and rhythm; no murmurs, rubs or bruits Abdomen: Soft, non tender and non distended. No masses, hepatosplenomegaly or hernias noted. Normal Bowel sounds Rectal: Not done Musculoskeletal: Symmetrical with no gross deformities  Pulses:  Normal pulses noted Extremities: No clubbing, cyanosis, edema or deformities noted Neurological: Alert oriented x 4, grossly nonfocal Psychological:  Alert and cooperative. Normal mood and affect   Assessment and Recommendations:  1. Crohn's ileitis. Improved however she has has ongoing symptoms on current regimen.  Complete prednisone taper  with 15 mg daily for 2 weeks, then 10 mg daily for 2 weeks, then 5 mg daily for 2 weeks.  Continue 6-MP 100 mg daily.  If Humira patient assistance is approved we will begin Humira.  If symptoms are well controlled on Humira consider discontinuing 6-MP in 6 to 12 months. REV in 1 month.

## 2020-01-18 NOTE — Patient Instructions (Addendum)
Dr Fuller Plan has advised that you be on a prednisone taper. The taper instructions are as follows: Prednisone 5 mg 3 tablets (15 mg) x 2 weeks, then reduce Prednisone 5 mg 2 tablets (10 mg) x 2 weeks, then reduce Prednisone 5 mg 1 tablet   (5 mg) x 2 weeks Then, discontinue.  We will send a prescription for #85 tablets to your pharmacy for your taper.  Thank you for choosing me and [facility].  Pricilla Riffle. Dagoberto Ligas., MD., [facility]

## 2020-01-31 ENCOUNTER — Telehealth: Payer: Self-pay

## 2020-01-31 NOTE — Telephone Encounter (Signed)
Form printed out. Left a message for the patient to call. I will need financial information, a shipping address and for her to come in to sign the form.

## 2020-01-31 NOTE — Telephone Encounter (Signed)
-----   Message from Alfredia Ferguson, PA-C sent at 01/18/2020 11:32 AM EDT ----- Regarding: RE: help with Biologic Yes , please proceed with Humira!Marland Kitchen.. Pt was in to see Dr Fuller Plan today , please get in touch with her ----- Message ----- From: Greggory Keen, LPN Sent: 8/67/5449  10:03 AM EDT To: Alfredia Ferguson, PA-C Subject: FW: help with Biologic                         Let me know how you feel about Humira for this lady. Thanks ----- Message ----- From: Audrea Muscat, CMA Sent: 01/12/2020   1:17 PM EST To: Greggory Keen, LPN Subject: RE: help with Biologic                         I think Humira is the best choice as far as assistance is concerned.  This sounds like a job for the new specialist they have - I hope to talk to her tomorrow.  They will probably tell you to start by enrolling in [facility] program but let my try to get up with her and i'll see what she says.   :) ----- Message ----- From: Greggory Keen, LPN Sent: 12/04/69  12:04 PM EST To: Alfredia Ferguson, PA-C, Audrea Muscat, CMA Subject: FW: help with Biologic                         I don't think Remicade has a patient assistance. She would also incur a hospital bill. Humira is a better choice I think.  Magda Paganini what is your input? You have had tremendous success in helping our patients, which is very appreciated.   ----- Message ----- From: Leotis Pain Sent: 12/20/2019   4:43 PM EST To: Greggory Keen, LPN Subject: help with Biologic                             Beth.  This is a Teacher, adult education patient with Crohn's ileitis diagnosed October 2020 when she was hospitalized with small bowel obstruction. She needs to get started on a biologic, preferably Remicade or Humira and third choice would be Stelara.Unfortunately she has no insurance, and says she does not qualify for Medicaid.And you please investigate if there is any patient assistance program available through any of these companies which would  allow her to get started on a biologic. Please call patient with information, and let me know if she has any options thank you

## 2020-02-02 ENCOUNTER — Telehealth: Payer: Self-pay | Admitting: Gastroenterology

## 2020-02-02 NOTE — Telephone Encounter (Signed)
Patient will bring financial information for the Humira program.

## 2020-02-02 NOTE — Telephone Encounter (Signed)
Patient called she said she is having a flare up and is very nauseous. Also would like to discuss the link sent to her for Humira

## 2020-02-04 ENCOUNTER — Ambulatory Visit: Payer: Self-pay | Attending: Internal Medicine

## 2020-02-04 DIAGNOSIS — Z23 Encounter for immunization: Secondary | ICD-10-CM

## 2020-02-04 NOTE — Progress Notes (Signed)
   Covid-19 Vaccination Clinic  Name:  Cynthia [name]    MRN: [id number] DOB: June 10, 1961  02/04/2020  [name] was observed post Covid-19 immunization for 15 minutes without incident. She was provided with Vaccine Information Sheet and instruction to access the V-Safe system.   [name] was instructed to call 911 with any severe reactions post vaccine: Marland Kitchen Difficulty breathing  . Swelling of face and throat  . A fast heartbeat  . A bad rash all over body  . Dizziness and weakness   Immunizations Administered    Name Date Dose VIS Date Route   Pfizer COVID-19 Vaccine 02/04/2020 11:15 AM 0.3 mL 10/14/2019 Intramuscular   Manufacturer: [employer], [employer]   Lot: [id number]   [place]: [id number]

## 2020-02-06 NOTE — Telephone Encounter (Signed)
Spoke with the patient. She will bring her social security print out of a bank statement. She understands the patient assistance must have documentation. Forms for her to sign are on my desk. She is to ask for me when she comes in.

## 2020-02-10 NOTE — Telephone Encounter (Signed)
No answer. No voicemail because it is "full and cannot accept messages." Message sent through My Chart. I cannot send the application for Humira without her signature and financial information.

## 2020-02-14 ENCOUNTER — Telehealth: Payer: Self-pay

## 2020-02-14 NOTE — Telephone Encounter (Signed)
Pt. Called stating she didn't realize she was out of refills on all of her medications. She said she really needs a refill on her Losartan-hctz and she uses the [facility] on [address]. Pt. Last apt was 09/02/19.

## 2020-02-15 ENCOUNTER — Telehealth: Payer: Self-pay | Admitting: Gastroenterology

## 2020-02-15 ENCOUNTER — Other Ambulatory Visit: Payer: Self-pay

## 2020-02-15 MED ORDER — LOSARTAN POTASSIUM-HCTZ 50-12.5 MG PO TABS: 1.0000 | ORAL_TABLET | Freq: Every day | ORAL | 0 refills | Status: DC

## 2020-02-15 NOTE — Telephone Encounter (Signed)
Patient approved for Humira through My [employer] Patient Assistance through 02/13/2021. Called the pharmacy. Verbal prescription given. Called the patient. No answer. Left her a message to call me. She needs to go on-line and sign up for Humira Complete. This will allow the Nurse Ambassador of Humira to contact her.

## 2020-02-15 NOTE — Telephone Encounter (Signed)
LVM that pt needs and appt med wil be sent in for thirty days. [place]

## 2020-02-15 NOTE — Telephone Encounter (Signed)
Also sent a mychart message. [place]

## 2020-02-16 NOTE — Telephone Encounter (Signed)
Pt brought in paperwork. She has been approved for Humira. See additional phone note.

## 2020-02-22 ENCOUNTER — Encounter: Payer: Self-pay | Admitting: Gastroenterology

## 2020-02-22 ENCOUNTER — Ambulatory Visit (INDEPENDENT_AMBULATORY_CARE_PROVIDER_SITE_OTHER): Payer: Self-pay | Admitting: Gastroenterology

## 2020-02-22 VITALS — BP 142/80 | HR 64 | Temp 97.2°F | Ht 63.5 in | Wt 186.0 lb

## 2020-02-22 DIAGNOSIS — K50018 Crohn's disease of small intestine with other complication: Secondary | ICD-10-CM

## 2020-02-22 NOTE — Progress Notes (Signed)
    History of Present Illness: This is a 59 year old female with Crohn's ileitis diagnosed several months ago associated with a small bowel obstruction.  She relates frequent problems with postprandial nausea and has been limiting her oral intake to about 1 small meal per day.  She relates a gradual weight loss since her hospitalization from 2 38-1 86 today.  After eating [place] food about a week and a half ago she had nausea vomiting and diarrhea for 3 or 4 days and then her symptoms abated.  We have been working with her to obtain Humira as symptoms are not adequately controlled on 6-MP.  She has completed a prolonged prednisone taper.  Current Medications, Allergies, Past Medical History, Past Surgical History, Family History and Social History were reviewed in [employer] record.  Physical Exam: General: Well developed, well nourished, no acute distress Head: Normocephalic and atraumatic Eyes:  sclerae anicteric, EOMI Ears: Normal auditory acuity Mouth: Not examined, mask on during Covid-19 pandemic Lungs: Clear throughout to auscultation Heart: Regular rate and rhythm; no murmurs, rubs or bruits Abdomen: Soft, non tender and non distended. No masses, hepatosplenomegaly or hernias noted. Normal Bowel sounds Rectal: Not done Musculoskeletal: Symmetrical with no gross deformities  Pulses:  Normal pulses noted Extremities: No clubbing, cyanosis, edema or deformities noted Neurological: Alert oriented x 4, grossly nonfocal Psychological:  Alert and cooperative. Normal mood and affect   Assessment and Recommendations:  1.  Crohn's ileitis with history of small bowel obstruction.  Continue 6-MP.  Begin Humira as soon as it is available.  Plan to overlap 6-MP and Humira for at least 6 to 12 months and then consider discontinuing 6-MP.  Patient is advised to call us as soon as she has more information from the [facility] nurse ambassador.  REV in 3 months.

## 2020-02-22 NOTE — Patient Instructions (Signed)
Please contact our office with a progress update on your Humira starting date and if we need to help with the process.   Normal BMI (Body Mass Index- based on height and weight) is between 19 and 25. Your BMI today is Body mass index is 32.43 kg/m. Marland Kitchen Please consider follow up  regarding your BMI with your Primary Care Provider.  Thank you for choosing me and [facility].  Pricilla Riffle. Dagoberto Ligas., MD., Marval Regal

## 2020-02-28 ENCOUNTER — Ambulatory Visit: Payer: Self-pay | Attending: Internal Medicine

## 2020-02-28 DIAGNOSIS — Z23 Encounter for immunization: Secondary | ICD-10-CM

## 2020-02-28 NOTE — Progress Notes (Signed)
   Covid-19 Vaccination Clinic  Name:  RICHELE STRAND    MRN: [id number] DOB: 12-13-1960  02/28/2020  Ms. Osinski was observed post Covid-19 immunization for 15 minutes without incident. She was provided with Vaccine Information Sheet and instruction to access the V-Safe system.   Ms. Klute was instructed to call 911 with any severe reactions post vaccine: Marland Kitchen Difficulty breathing  . Swelling of face and throat  . A fast heartbeat  . A bad rash all over body  . Dizziness and weakness   Immunizations Administered    Name Date Dose VIS Date Route   Pfizer COVID-19 Vaccine 02/28/2020  1:30 PM 0.3 mL 12/28/2018 Intramuscular   Manufacturer: [place]   Lot: [id number]   [place]: [address]-7

## 2020-03-22 ENCOUNTER — Other Ambulatory Visit: Payer: Self-pay | Admitting: Family Medicine

## 2020-03-26 ENCOUNTER — Ambulatory Visit (INDEPENDENT_AMBULATORY_CARE_PROVIDER_SITE_OTHER): Payer: 59 | Admitting: Family Medicine

## 2020-03-26 ENCOUNTER — Encounter: Payer: Self-pay | Admitting: Family Medicine

## 2020-03-26 ENCOUNTER — Other Ambulatory Visit: Payer: Self-pay

## 2020-03-26 VITALS — BP 160/92 | HR 81 | Temp 97.8°F | Wt 182.6 lb

## 2020-03-26 DIAGNOSIS — G43909 Migraine, unspecified, not intractable, without status migrainosus: Secondary | ICD-10-CM | POA: Diagnosis not present

## 2020-03-26 DIAGNOSIS — K219 Gastro-esophageal reflux disease without esophagitis: Secondary | ICD-10-CM

## 2020-03-26 DIAGNOSIS — F341 Dysthymic disorder: Secondary | ICD-10-CM | POA: Diagnosis not present

## 2020-03-26 DIAGNOSIS — Z23 Encounter for immunization: Secondary | ICD-10-CM

## 2020-03-26 DIAGNOSIS — G2581 Restless legs syndrome: Secondary | ICD-10-CM | POA: Diagnosis not present

## 2020-03-26 DIAGNOSIS — I10 Essential (primary) hypertension: Secondary | ICD-10-CM | POA: Diagnosis not present

## 2020-03-26 DIAGNOSIS — F17200 Nicotine dependence, unspecified, uncomplicated: Secondary | ICD-10-CM

## 2020-03-26 DIAGNOSIS — Z1231 Encounter for screening mammogram for malignant neoplasm of breast: Secondary | ICD-10-CM

## 2020-03-26 MED ORDER — PRAMIPEXOLE DIHYDROCHLORIDE 1 MG PO TABS: ORAL_TABLET | ORAL | 3 refills | Status: DC

## 2020-03-26 MED ORDER — ESOMEPRAZOLE MAGNESIUM 40 MG PO CPDR: DELAYED_RELEASE_CAPSULE | ORAL | 3 refills | Status: DC

## 2020-03-26 MED ORDER — LOSARTAN POTASSIUM-HCTZ 50-12.5 MG PO TABS: 1.0000 | ORAL_TABLET | Freq: Every day | ORAL | 3 refills | Status: DC

## 2020-03-26 MED ORDER — VENLAFAXINE HCL ER 150 MG PO CP24: ORAL_CAPSULE | ORAL | 0 refills | Status: DC

## 2020-03-26 NOTE — Progress Notes (Signed)
   Subjective:    Patient ID: Cynthia [name], female    DOB: 1961-01-05, 59 y.o.   MRN: [id number]  HPI She is here for a med check appointment.  She was recently diagnosed with Crohn's disease and is now using Humira.  She has noted an improvement in her symptoms.  She is giving her own shots.  She does have a history of migraine headaches but has not had any recently.  She also has RLS and is doing well on Mirapex.  She continues on Nexium for her reflux and is getting good results with that.  She continues to smoke but is considering stopping again.  Apparently she did use Chantix in the past with success but then ran into trouble with an unusual stress.  Psychologically she is doing well on Effexor.   Review of Systems     Objective:   Physical Exam Alert and in no distress. Tympanic membranes and canals are normal. Pharyngeal area is normal. Neck is supple without adenopathy or thyromegaly. Cardiac exam shows a regular sinus rhythm without murmurs or gallops. Lungs are clear to auscultation.        Assessment & Plan:  Need for Tdap vaccination - Plan: Tdap vaccine greater than or equal to 7yo IM  Need for shingles vaccine - Plan: Varicella-zoster vaccine IM  Migraine without status migrainosus, not intractable, unspecified migraine type - Plan: venlafaxine XR (EFFEXOR-XR) 150 MG 24 hr capsule  Dysthymia - Plan: venlafaxine XR (EFFEXOR-XR) 150 MG 24 hr capsule  Essential hypertension - Plan: losartan-hydrochlorothiazide (HYZAAR) 50-12.5 MG tablet  RLS (restless legs syndrome) - Plan: pramipexole (MIRAPEX) 1 MG tablet  Encounter for screening mammogram for malignant neoplasm of breast - Plan: MM DIGITAL SCREENING BILATERAL  Current smoker  Gastroesophageal reflux disease - Plan: esomeprazole (NEXIUM) 40 MG capsule  She will continue on her present medication regimen.  Immunizations were given.  She is to discuss when to get the next Humira shot.  She is also to return here  in the near future for a Pap.  Also discussed smoking and will see her whenever she is ready to quit and place her back on Chantix.

## 2020-03-27 ENCOUNTER — Telehealth: Payer: Self-pay | Admitting: Gastroenterology

## 2020-03-27 NOTE — Telephone Encounter (Signed)
I sent her a message that it is safe to proceed with her Humira

## 2020-05-03 ENCOUNTER — Other Ambulatory Visit: Payer: Self-pay

## 2020-05-03 MED ORDER — MERCAPTOPURINE 50 MG PO TABS: 100.0000 mg | ORAL_TABLET | Freq: Every day | ORAL | 1 refills | Status: DC

## 2020-05-16 ENCOUNTER — Other Ambulatory Visit: Payer: Self-pay

## 2020-05-16 MED ORDER — ONDANSETRON HCL 4 MG PO TABS
4.0000 mg | ORAL_TABLET | Freq: Three times a day (TID) | ORAL | 2 refills | Status: DC | PRN
Start: 2020-05-16 — End: 2020-08-03

## 2020-05-17 ENCOUNTER — Other Ambulatory Visit: Payer: Self-pay

## 2020-05-17 MED ORDER — DICYCLOMINE HCL 10 MG PO CAPS: 10.0000 mg | ORAL_CAPSULE | Freq: Three times a day (TID) | ORAL | 3 refills | Status: DC

## 2020-05-25 ENCOUNTER — Telehealth: Payer: Self-pay | Admitting: Family Medicine

## 2020-05-25 NOTE — Telephone Encounter (Signed)
Pt called and states she was scheduled for a pap on Monday. Per [employer] pt does not need due to the fact she has had a complete hysterectomy by Dr. Philis Pique at [facility]. Per JCL needs to be documented in her chart.

## 2020-05-26 ENCOUNTER — Other Ambulatory Visit: Payer: Self-pay | Admitting: Gastroenterology

## 2020-05-28 ENCOUNTER — Other Ambulatory Visit: Payer: 59 | Admitting: Family Medicine

## 2020-05-28 NOTE — Telephone Encounter (Signed)
Please advise KH 

## 2020-05-28 NOTE — Telephone Encounter (Signed)
Document the hysterectomy

## 2020-05-29 ENCOUNTER — Other Ambulatory Visit (INDEPENDENT_AMBULATORY_CARE_PROVIDER_SITE_OTHER): Payer: 59

## 2020-05-29 ENCOUNTER — Other Ambulatory Visit: Payer: Self-pay

## 2020-05-29 DIAGNOSIS — Z23 Encounter for immunization: Secondary | ICD-10-CM | POA: Diagnosis not present

## 2020-05-31 ENCOUNTER — Encounter: Payer: Self-pay | Admitting: Gastroenterology

## 2020-05-31 ENCOUNTER — Ambulatory Visit (INDEPENDENT_AMBULATORY_CARE_PROVIDER_SITE_OTHER): Payer: Self-pay | Admitting: Gastroenterology

## 2020-05-31 ENCOUNTER — Other Ambulatory Visit (INDEPENDENT_AMBULATORY_CARE_PROVIDER_SITE_OTHER): Payer: 59

## 2020-05-31 VITALS — BP 118/72 | HR 96 | Ht 63.5 in | Wt 165.6 lb

## 2020-05-31 DIAGNOSIS — K5000 Crohn's disease of small intestine without complications: Secondary | ICD-10-CM

## 2020-05-31 LAB — CBC WITH DIFFERENTIAL/PLATELET
Basophils Absolute: 0 10*3/uL (ref 0.0–0.1)
Basophils Relative: 0.5 % (ref 0.0–3.0)
Eosinophils Absolute: 0.2 10*3/uL (ref 0.0–0.7)
Eosinophils Relative: 2.4 % (ref 0.0–5.0)
HCT: 41.5 % (ref 36.0–46.0)
Hemoglobin: 14 g/dL (ref 12.0–15.0)
Lymphocytes Relative: 21.7 % (ref 12.0–46.0)
Lymphs Abs: 2.2 10*3/uL (ref 0.7–4.0)
MCHC: 33.7 g/dL (ref 30.0–36.0)
MCV: 86.8 fl (ref 78.0–100.0)
Monocytes Absolute: 0.8 10*3/uL (ref 0.1–1.0)
Monocytes Relative: 7.7 % (ref 3.0–12.0)
Neutro Abs: 7 10*3/uL (ref 1.4–7.7)
Neutrophils Relative %: 67.7 % (ref 43.0–77.0)
Platelets: 325 10*3/uL (ref 150.0–400.0)
RBC: 4.78 Mil/uL (ref 3.87–5.11)
RDW: 13.2 % (ref 11.5–15.5)
WBC: 10.3 10*3/uL (ref 4.0–10.5)

## 2020-05-31 LAB — COMPREHENSIVE METABOLIC PANEL
ALT: 8 U/L (ref 0–35)
AST: 12 U/L (ref 0–37)
Albumin: 3.9 g/dL (ref 3.5–5.2)
Alkaline Phosphatase: 59 U/L (ref 39–117)
BUN: 9 mg/dL (ref 6–23)
CO2: 30 mEq/L (ref 19–32)
Calcium: 9.5 mg/dL (ref 8.4–10.5)
Chloride: 101 mEq/L (ref 96–112)
Creatinine, Ser: 0.87 mg/dL (ref 0.40–1.20)
GFR: 66.29 mL/min (ref >60.00)
Glucose, Bld: 96 mg/dL (ref 70–99)
Potassium: 3.2 mEq/L — ABNORMAL LOW (ref 3.5–5.1)
Sodium: 137 mEq/L (ref 135–145)
Total Bilirubin: 0.4 mg/dL (ref 0.2–1.2)
Total Protein: 7.6 g/dL (ref 6.0–8.3)

## 2020-05-31 LAB — C-REACTIVE PROTEIN: CRP: 6.5 mg/dL (ref 0.5–20.0)

## 2020-05-31 LAB — SEDIMENTATION RATE: Sed Rate: 48 mm/hr — ABNORMAL HIGH (ref 0–30)

## 2020-05-31 LAB — TSH: TSH: 0.58 u[IU]/mL (ref 0.35–4.50)

## 2020-05-31 LAB — LIPASE: Lipase: 101 U/L — ABNORMAL HIGH (ref 11.0–59.0)

## 2020-05-31 NOTE — Progress Notes (Signed)
    History of Present Illness: This is a 59 year old female with Crohn's ileitis. She has intermittent severe upper abdominal pains every couple weeks. Pain generally lasts for 1 to 2 days. She is unable to take anything but liquids at the time. She does not note distention or reduced bowel movements during this time. In between episodes she has no pain. She has limited her diet to soft foods and is gradually losing weight. Ongoing mild diarrhea.   Current Medications, Allergies, Past Medical History, Past Surgical History, Family History and Social History were reviewed in [employer] record.    Physical Exam: General: Well developed, well nourished, no acute distress Head: Normocephalic and atraumatic Eyes:  sclerae anicteric, EOMI Ears: Normal auditory acuity Mouth: Not examined, mask on during Covid-19 pandemic Lungs: Clear throughout to auscultation Heart: Regular rate and rhythm; no murmurs, rubs or bruits Abdomen: Soft, non tender and non distended. No masses, hepatosplenomegaly or hernias noted. Normal Bowel sounds Rectal: Not done Musculoskeletal: Symmetrical with no gross deformities  Pulses:  Normal pulses noted Extremities: No clubbing, cyanosis, edema or deformities noted Neurological: Alert oriented x 4, grossly nonfocal Psychological:  Alert and cooperative. Normal mood and affect   Assessment and Recommendations:  1. Crohn's ileitis with history of obstruction. Symptoms concerning for Crohn's activity and possible intermittent partial small bowel obstructions. CBC, CMP, lipase, ESR, CRP, TSH. Schedule CT AP enterography. Continue Humira and 6-MP. REV in 6 weeks.

## 2020-05-31 NOTE — Patient Instructions (Signed)
Your provider has requested that you go to the basement level for lab work before leaving today. Press "B" on the elevator. The lab is located at the first door on the left as you exit the elevator.  You have been scheduled for a CT enterography of the abdomen and pelvis at [facility] Radiology department.   You are scheduled on 06/12/20 at 3:00pm. You should arrive at 1:15pm.   Do not eat or drink anything after 11:00am (4 hours prior to your test)   You may take any medications as prescribed with a small amount of water, if necessary. If you take any of the following medications: METFORMIN, GLUCOPHAGE, [place], AVANDAMET, RIOMET, FORTAMET, [place] MET, JANUMET, GLUMETZA or METAGLIP, you MAY be asked to HOLD this medication 48 hours AFTER the exam.   If you have any questions regarding your exam or if you need to reschedule, you may call the CT department at [phone] between the hours of 8:00 am and 5:00 pm, Monday-Friday.  ____________________________________________________________

## 2020-06-01 ENCOUNTER — Other Ambulatory Visit: Payer: Self-pay

## 2020-06-01 DIAGNOSIS — R748 Abnormal levels of other serum enzymes: Secondary | ICD-10-CM

## 2020-06-06 ENCOUNTER — Telehealth: Payer: Self-pay | Admitting: Family Medicine

## 2020-06-06 NOTE — Telephone Encounter (Signed)
Pt called and states that she recently had an appt with Dr. Fuller Plan her GI Doctor. She states that he told her to get in touch with pcp concerning her potassium level and HCTZ med. She states that she may need med to help with potassium and HCTC med may need to be changed.  Information is documented in her labs notes. Please review and advise pt at [phone], pt uses [facility]

## 2020-06-06 NOTE — Telephone Encounter (Signed)
Have her add a teaspoon of Nosalt to her diet.  Let her know that she has to spread that out over the whole day because it is very potent

## 2020-06-07 NOTE — Telephone Encounter (Signed)
Pt was advised Cynthia [name] 

## 2020-06-08 ENCOUNTER — Other Ambulatory Visit (INDEPENDENT_AMBULATORY_CARE_PROVIDER_SITE_OTHER): Payer: 59

## 2020-06-08 DIAGNOSIS — R748 Abnormal levels of other serum enzymes: Secondary | ICD-10-CM | POA: Diagnosis not present

## 2020-06-08 LAB — LIPASE: Lipase: 17 U/L (ref 11.0–59.0)

## 2020-06-12 ENCOUNTER — Other Ambulatory Visit: Payer: Self-pay

## 2020-06-12 ENCOUNTER — Encounter (HOSPITAL_COMMUNITY): Payer: Self-pay

## 2020-06-12 ENCOUNTER — Telehealth: Payer: Self-pay | Admitting: Gastroenterology

## 2020-06-12 ENCOUNTER — Ambulatory Visit (HOSPITAL_COMMUNITY)
Admission: RE | Admit: 2020-06-12 | Discharge: 2020-06-12 | Disposition: A | Discharge status: Home / Self Care | Payer: 59 | Source: Ambulatory Visit | Attending: Gastroenterology | Admitting: Gastroenterology

## 2020-06-12 DIAGNOSIS — K632 Fistula of intestine: Secondary | ICD-10-CM

## 2020-06-12 DIAGNOSIS — K5000 Crohn's disease of small intestine without complications: Secondary | ICD-10-CM | POA: Diagnosis present

## 2020-06-12 HISTORY — DX: Fistula of intestine: K63.2

## 2020-06-12 MED ORDER — IOHEXOL 300 MG/ML  SOLN
100.0000 mL | Freq: Once | INTRAMUSCULAR | Status: AC | PRN
  Administered 2020-06-12: 100 mL via INTRAVENOUS

## 2020-06-12 MED ORDER — SODIUM CHLORIDE (PF) 0.9 % IJ SOLN
INTRAMUSCULAR | Status: AC
  Filled 2020-06-12: qty 50

## 2020-06-12 MED ORDER — BARIUM SULFATE 0.1 % PO SUSP
ORAL | Status: AC
  Filled 2020-06-12: qty 3

## 2020-06-12 NOTE — Telephone Encounter (Signed)
CT scan already completed with original order.

## 2020-06-13 ENCOUNTER — Telehealth: Payer: Self-pay | Admitting: Gastroenterology

## 2020-06-13 NOTE — Telephone Encounter (Signed)
Spoke with patient, advised that the radiology reports get sent to My Chart before the Dr reviews them. I advised that we will give her a call once Dr. Fuller Plan reviews the CT results.

## 2020-06-13 NOTE — Telephone Encounter (Signed)
Pt is requesting a call back regarding her CT scan results

## 2020-06-27 ENCOUNTER — Inpatient Hospital Stay (HOSPITAL_COMMUNITY): Payer: 59

## 2020-06-27 ENCOUNTER — Emergency Department (HOSPITAL_COMMUNITY): Payer: 59

## 2020-06-27 ENCOUNTER — Encounter (HOSPITAL_COMMUNITY): Payer: Self-pay | Admitting: *Deleted

## 2020-06-27 ENCOUNTER — Other Ambulatory Visit: Payer: Self-pay

## 2020-06-27 ENCOUNTER — Inpatient Hospital Stay (HOSPITAL_COMMUNITY)
Admission: EM | Admit: 2020-06-27 | Discharge: 2020-07-03 | DRG: 385 | Disposition: A | Discharge status: Home / Self Care | Payer: 59 | Attending: Internal Medicine | Admitting: Internal Medicine

## 2020-06-27 DIAGNOSIS — K50013 Crohn's disease of small intestine with fistula: Principal | ICD-10-CM | POA: Diagnosis present

## 2020-06-27 DIAGNOSIS — Z789 Other specified health status: Secondary | ICD-10-CM

## 2020-06-27 DIAGNOSIS — Z791 Long term (current) use of non-steroidal anti-inflammatories (NSAID): Secondary | ICD-10-CM | POA: Diagnosis not present

## 2020-06-27 DIAGNOSIS — G2581 Restless legs syndrome: Secondary | ICD-10-CM | POA: Diagnosis present

## 2020-06-27 DIAGNOSIS — R112 Nausea with vomiting, unspecified: Secondary | ICD-10-CM | POA: Diagnosis not present

## 2020-06-27 DIAGNOSIS — E43 Unspecified severe protein-calorie malnutrition: Secondary | ICD-10-CM | POA: Diagnosis not present

## 2020-06-27 DIAGNOSIS — Z79899 Other long term (current) drug therapy: Secondary | ICD-10-CM

## 2020-06-27 DIAGNOSIS — T380X5A Adverse effect of glucocorticoids and synthetic analogues, initial encounter: Secondary | ICD-10-CM | POA: Diagnosis not present

## 2020-06-27 DIAGNOSIS — I341 Nonrheumatic mitral (valve) prolapse: Secondary | ICD-10-CM | POA: Diagnosis present

## 2020-06-27 DIAGNOSIS — Z6829 Body mass index (BMI) 29.0-29.9, adult: Secondary | ICD-10-CM | POA: Diagnosis not present

## 2020-06-27 DIAGNOSIS — F17210 Nicotine dependence, cigarettes, uncomplicated: Secondary | ICD-10-CM | POA: Diagnosis present

## 2020-06-27 DIAGNOSIS — K449 Diaphragmatic hernia without obstruction or gangrene: Secondary | ICD-10-CM | POA: Diagnosis present

## 2020-06-27 DIAGNOSIS — R1084 Generalized abdominal pain: Secondary | ICD-10-CM

## 2020-06-27 DIAGNOSIS — K56609 Unspecified intestinal obstruction, unspecified as to partial versus complete obstruction: Secondary | ICD-10-CM

## 2020-06-27 DIAGNOSIS — I10 Essential (primary) hypertension: Secondary | ICD-10-CM | POA: Diagnosis present

## 2020-06-27 DIAGNOSIS — E669 Obesity, unspecified: Secondary | ICD-10-CM | POA: Diagnosis present

## 2020-06-27 DIAGNOSIS — R739 Hyperglycemia, unspecified: Secondary | ICD-10-CM | POA: Diagnosis not present

## 2020-06-27 DIAGNOSIS — K632 Fistula of intestine: Secondary | ICD-10-CM

## 2020-06-27 DIAGNOSIS — Z20822 Contact with and (suspected) exposure to covid-19: Secondary | ICD-10-CM | POA: Diagnosis present

## 2020-06-27 DIAGNOSIS — F419 Anxiety disorder, unspecified: Secondary | ICD-10-CM | POA: Diagnosis present

## 2020-06-27 DIAGNOSIS — I444 Left anterior fascicular block: Secondary | ICD-10-CM | POA: Diagnosis present

## 2020-06-27 DIAGNOSIS — K5090 Crohn's disease, unspecified, without complications: Secondary | ICD-10-CM | POA: Diagnosis present

## 2020-06-27 DIAGNOSIS — Z833 Family history of diabetes mellitus: Secondary | ICD-10-CM

## 2020-06-27 DIAGNOSIS — Z888 Allergy status to other drugs, medicaments and biological substances status: Secondary | ICD-10-CM | POA: Diagnosis not present

## 2020-06-27 DIAGNOSIS — G43909 Migraine, unspecified, not intractable, without status migrainosus: Secondary | ICD-10-CM | POA: Diagnosis present

## 2020-06-27 DIAGNOSIS — K219 Gastro-esophageal reflux disease without esophagitis: Secondary | ICD-10-CM | POA: Diagnosis present

## 2020-06-27 DIAGNOSIS — Z832 Family history of diseases of the blood and blood-forming organs and certain disorders involving the immune mechanism: Secondary | ICD-10-CM | POA: Diagnosis not present

## 2020-06-27 DIAGNOSIS — E876 Hypokalemia: Secondary | ICD-10-CM | POA: Diagnosis not present

## 2020-06-27 DIAGNOSIS — K50012 Crohn's disease of small intestine with intestinal obstruction: Secondary | ICD-10-CM | POA: Diagnosis present

## 2020-06-27 DIAGNOSIS — F329 Major depressive disorder, single episode, unspecified: Secondary | ICD-10-CM | POA: Diagnosis present

## 2020-06-27 LAB — URINALYSIS, ROUTINE W REFLEX MICROSCOPIC
Bilirubin Urine: NEGATIVE
Glucose, UA: NEGATIVE mg/dL
Hgb urine dipstick: NEGATIVE
Ketones, ur: NEGATIVE mg/dL
Nitrite: POSITIVE — AB
Protein, ur: 30 mg/dL — AB
Specific Gravity, Urine: 1.018 (ref 1.005–1.030)
pH: 7 (ref 5.0–8.0)

## 2020-06-27 LAB — CBC
HCT: 45.7 % (ref 36.0–46.0)
Hemoglobin: 15.2 g/dL — ABNORMAL HIGH (ref 12.0–15.0)
MCH: 28.6 pg (ref 26.0–34.0)
MCHC: 33.3 g/dL (ref 30.0–36.0)
MCV: 86.1 fL (ref 80.0–100.0)
Platelets: 384 10*3/uL (ref 150–400)
RBC: 5.31 MIL/uL — ABNORMAL HIGH (ref 3.87–5.11)
RDW: 13.7 % (ref 11.5–15.5)
WBC: 12.8 10*3/uL — ABNORMAL HIGH (ref 4.0–10.5)
nRBC: 0 % (ref 0.0–0.2)

## 2020-06-27 LAB — COMPREHENSIVE METABOLIC PANEL
ALT: 10 U/L (ref 0–44)
AST: 13 U/L — ABNORMAL LOW (ref 15–41)
Albumin: 3.3 g/dL — ABNORMAL LOW (ref 3.5–5.0)
Alkaline Phosphatase: 61 U/L (ref 38–126)
Anion gap: 13 (ref 5–15)
BUN: 12 mg/dL (ref 6–20)
CO2: 29 mmol/L (ref 22–32)
Calcium: 9.2 mg/dL (ref 8.9–10.3)
Chloride: 98 mmol/L (ref 98–111)
Creatinine, Ser: 0.85 mg/dL (ref 0.44–1.00)
GFR calc Af Amer: >60 mL/min (ref >60)
GFR calc non Af Amer: >60 mL/min (ref >60)
Glucose, Bld: 149 mg/dL — ABNORMAL HIGH (ref 70–99)
Potassium: 2.9 mmol/L — ABNORMAL LOW (ref 3.5–5.1)
Sodium: 140 mmol/L (ref 135–145)
Total Bilirubin: 0.5 mg/dL (ref 0.3–1.2)
Total Protein: 7.1 g/dL (ref 6.5–8.1)

## 2020-06-27 LAB — SARS CORONAVIRUS 2 BY RT PCR (HOSPITAL ORDER, PERFORMED IN ~~LOC~~ HOSPITAL LAB): SARS Coronavirus 2: NEGATIVE

## 2020-06-27 LAB — MAGNESIUM: Magnesium: 1.9 mg/dL (ref 1.7–2.4)

## 2020-06-27 LAB — LIPASE, BLOOD: Lipase: 22 U/L (ref 11–51)

## 2020-06-27 MED ORDER — IOHEXOL 9 MG/ML PO SOLN: 500.0000 mL | ORAL | Status: AC

## 2020-06-27 MED ORDER — ENOXAPARIN SODIUM 40 MG/0.4ML ~~LOC~~ SOLN
40.0000 mg | SUBCUTANEOUS | Status: DC
  Administered 2020-06-27 – 2020-06-29 (×3): 40 mg via SUBCUTANEOUS
  Filled 2020-06-27 (×3): qty 0.4

## 2020-06-27 MED ORDER — SODIUM CHLORIDE 0.9 % IV SOLN: INTRAVENOUS | Status: DC

## 2020-06-27 MED ORDER — PHENOL 1.4 % MT LIQD
1.0000 | OROMUCOSAL | Status: DC | PRN
  Administered 2020-06-27: 1 via OROMUCOSAL
  Filled 2020-06-27 (×2): qty 177

## 2020-06-27 MED ORDER — SODIUM CHLORIDE (PF) 0.9 % IJ SOLN
INTRAMUSCULAR | Status: AC
  Filled 2020-06-27: qty 50

## 2020-06-27 MED ORDER — MORPHINE SULFATE (PF) 2 MG/ML IV SOLN
2.0000 mg | Freq: Once | INTRAVENOUS | Status: AC
  Administered 2020-06-27: 2 mg via INTRAVENOUS
  Filled 2020-06-27: qty 1

## 2020-06-27 MED ORDER — MORPHINE SULFATE (PF) 2 MG/ML IV SOLN
2.0000 mg | INTRAVENOUS | Status: DC | PRN
  Administered 2020-06-27 – 2020-07-01 (×17): 2 mg via INTRAVENOUS
  Filled 2020-06-27 (×18): qty 1

## 2020-06-27 MED ORDER — IOHEXOL 9 MG/ML PO SOLN
ORAL | Status: AC
  Filled 2020-06-27: qty 1000

## 2020-06-27 MED ORDER — SODIUM CHLORIDE 0.9 % IV SOLN: INTRAVENOUS | Status: AC

## 2020-06-27 MED ORDER — PANTOPRAZOLE SODIUM 40 MG IV SOLR
40.0000 mg | INTRAVENOUS | Status: DC
  Filled 2020-06-27: qty 40

## 2020-06-27 MED ORDER — METHYLPREDNISOLONE SODIUM SUCC 40 MG IJ SOLR: 40.0000 mg | Freq: Every day | INTRAMUSCULAR | Status: DC

## 2020-06-27 MED ORDER — SODIUM CHLORIDE 0.9 % IV BOLUS
1000.0000 mL | Freq: Once | INTRAVENOUS | Status: AC
  Administered 2020-06-27: 1000 mL via INTRAVENOUS

## 2020-06-27 MED ORDER — POTASSIUM CHLORIDE 10 MEQ/100ML IV SOLN
10.0000 meq | INTRAVENOUS | Status: AC
  Administered 2020-06-27 (×2): 10 meq via INTRAVENOUS
  Filled 2020-06-27 (×2): qty 100

## 2020-06-27 MED ORDER — MORPHINE SULFATE (PF) 4 MG/ML IV SOLN
4.0000 mg | Freq: Once | INTRAVENOUS | Status: AC
  Administered 2020-06-27: 4 mg via INTRAVENOUS
  Filled 2020-06-27: qty 1

## 2020-06-27 MED ORDER — ONDANSETRON HCL 4 MG/2ML IJ SOLN
4.0000 mg | Freq: Once | INTRAMUSCULAR | Status: AC
  Administered 2020-06-27: 4 mg via INTRAVENOUS
  Filled 2020-06-27: qty 2

## 2020-06-27 MED ORDER — ACETAMINOPHEN 650 MG RE SUPP: 650.0000 mg | Freq: Four times a day (QID) | RECTAL | Status: DC | PRN

## 2020-06-27 MED ORDER — ONDANSETRON HCL 4 MG/2ML IJ SOLN
4.0000 mg | Freq: Four times a day (QID) | INTRAMUSCULAR | Status: DC | PRN
  Administered 2020-06-27 – 2020-06-28 (×2): 4 mg via INTRAVENOUS
  Filled 2020-06-27 (×3): qty 2

## 2020-06-27 MED ORDER — ACETAMINOPHEN 325 MG PO TABS
650.0000 mg | ORAL_TABLET | Freq: Four times a day (QID) | ORAL | Status: DC | PRN
  Administered 2020-06-30 – 2020-07-03 (×5): 650 mg via ORAL
  Filled 2020-06-27 (×5): qty 2

## 2020-06-27 MED ORDER — MAGNESIUM SULFATE IN D5W 1-5 GM/100ML-% IV SOLN
1.0000 g | Freq: Once | INTRAVENOUS | Status: AC
  Administered 2020-06-27: 1 g via INTRAVENOUS
  Filled 2020-06-27: qty 100

## 2020-06-27 MED ORDER — POTASSIUM CHLORIDE CRYS ER 20 MEQ PO TBCR
40.0000 meq | EXTENDED_RELEASE_TABLET | Freq: Once | ORAL | Status: AC
  Administered 2020-06-27: 40 meq via ORAL
  Filled 2020-06-27: qty 2

## 2020-06-27 MED ORDER — IOHEXOL 300 MG/ML  SOLN
100.0000 mL | Freq: Once | INTRAMUSCULAR | Status: AC | PRN
  Administered 2020-06-27: 100 mL via INTRAVENOUS

## 2020-06-27 NOTE — ED Triage Notes (Addendum)
Per EMS, she had mid abdominal pain starting last night. Hx of Crohn's. Pt was told she had a partial blockage a few weeks ago. Pt had an appointment with GI in October but pain became more severe. Pt vomited 4 hours ago, no blood. Had diarrhea yesterday morning. 143mg fentanyl given en route. Pain went from 10 to 4 after fentanyl.   BP 158/114 HR 90 SpO2 97% on RA Temp 97.2 RR 20 CBG 133

## 2020-06-27 NOTE — Progress Notes (Signed)
CTAP images personally reviewed.  SBO has progressed from 2 weeks ago and redemonstrates enteroenteric and enterocolonic fistulae.  We have ordered placement of an NG tube and will communicate with the patient's nurse about this.  This patient's Crohn's disease has progressed beyond medical management and will need surgery after a period of decompression.  No steroids ordered.

## 2020-06-27 NOTE — Consult Note (Signed)
[facility] Consult Note  Sahira Cataldi [facility] 1961/02/17  [id number].    Requesting MD: Louie Bun Chief Complaint/Reason for Consult: Crohn's disease, possible SBO HPI:  Patient is a 59 year old female PMH Crohn's disease, HTN, GERD, depression who presented to [facility] this morning complaining of worsening abdominal pain. She is followed by [facility] for her Crohns and is currently on Humira. States that yesterday morning she developed LUQ abdominal pain radiating across her upper abdomen. Pain was intermittent and associated with nausea and vomiting. Worse with PO intake. She had one episode of diarrhea, but since that time no BM or flatus. Denies CP, SOB, fever, chills.    In the ED labs significant for WBC 12.8, K 2.9, albumin 3.3. U/a with positive nitrites, trace leukocytes, and many bacteria.  Abdominal xray shows dilated loops of small bowel concerning for bowel obstruction, no free air. Covid pending, she has had both vaccines. Patient is being admitted to the hospitalist service. General surgery asked to see.  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  PSH: salpingoophorectomy, abdominal hysterectomy, transvaginal cystopexy Smokes daily but has been smoking less since she has felt sick Drinks alcohol rarely Formerly used cocaine socially  ROS: Review of Systems  Constitutional: Negative for chills and fever.  Respiratory: Negative for shortness of breath and wheezing.   Cardiovascular: Negative for chest pain and palpitations.  Gastrointestinal: Positive for abdominal pain, diarrhea, nausea and vomiting.  Genitourinary: Negative for dysuria, frequency and urgency.  All other systems reviewed and are negative.   Family History  Problem  Relation Age of Onset  . Diabetes Mother   . Dementia Mother   . Clotting disorder Father   . Macular degeneration Father   . Crohn's disease Sister   . Colon cancer Neg Hx   . Esophageal cancer Neg Hx   . Pancreatic cancer Neg Hx   . Stomach cancer Neg Hx   . Liver disease Neg Hx     Past Medical History:  Diagnosis Date  . Anemia 2012  . Anxiety   . Bronchitis   . Crohn's ileitis ([place])   . Depression   . GERD (gastroesophageal reflux disease)   . Headache(784.0)    MIGRAINE  . Heart murmur   . Hiatal hernia   . Hypertension   . IBS (irritable bowel syndrome)   . Incontinence   . MVP (mitral valve prolapse)   . RLS (restless legs syndrome)   . Tubular adenoma of colon 08/2019    Past Surgical History:  Procedure Laterality Date  . ABDOMINAL HYSTERECTOMY  06/17/2011   Procedure: HYSTERECTOMY ABDOMINAL;  Surgeon: Arloa Koh;  Location: [facility];  Service: Gynecology;  Laterality: N/A;  Converted to Abdominal Hysterectomy with lysis of adhesions   . ANTERIOR AND POSTERIOR REPAIR  02/25/2012   Procedure: ANTERIOR (CYSTOCELE) AND POSTERIOR REPAIR (RECTOCELE);  Surgeon: Daria Pastures, MD;  Location: [facility];  Service: Gynecology;  Laterality: N/A;  ANterior cystocele repair ONLY  . BIOPSY  08/29/2019   Procedure: BIOPSY;  Surgeon: Lavena Bullion, DO;  Location: [facility];  Service: Gastroenterology;;  . BLADDER SUSPENSION  02/25/2012   Procedure: TRANSVAGINAL TAPE (TVT) PROCEDURE;  Surgeon: Daria Pastures, MD;  Location: [facility];  Service: Gynecology;  Laterality: N/A;  . COLONOSCOPY WITH PROPOFOL  N/A 08/29/2019   Procedure: COLONOSCOPY WITH PROPOFOL;  Surgeon: Lavena Bullion, DO;  Location: [facility];  Service: Gastroenterology;  Laterality: N/A;  . CYSTOSCOPY  02/25/2012   Procedure: CYSTOSCOPY;  Surgeon: Daria Pastures, MD;  Location: [facility];  Service: Gynecology;  Laterality: N/A;  . KNEE ARTHROSCOPY  2010  . LAPAROSCOPIC ASSISTED VAGINAL  HYSTERECTOMY  06/17/2011   Procedure: LAPAROSCOPIC ASSISTED VAGINAL HYSTERECTOMY;  Surgeon: Arloa Koh;  Location: [facility];  Service: Gynecology;  Laterality: N/A;  Attempted   . POLYPECTOMY  08/29/2019   Procedure: POLYPECTOMY;  Surgeon: Lavena Bullion, DO;  Location: [facility];  Service: Gastroenterology;;  . SALPINGOOPHORECTOMY  06/17/2011   Procedure: SALPINGO OOPHERECTOMY;  Surgeon: Arloa Koh;  Location: [facility] [facility];  Service: Gynecology;  Laterality: Bilateral;  . TONSILLECTOMY      Social History:  reports that she has been smoking cigarettes. She has been smoking about 0.50 packs per day. She has never used smokeless tobacco. She reports current alcohol use. She reports current drug use. Drug: Cocaine.  Allergies:  Allergies  Allergen Reactions  . Iodine     REACTION: in eye gtts Iodine eye drops; pt has no reactions to CT contrast    (Not in a hospital admission)   Blood pressure (!) 153/96, pulse 87, temperature 98.2 F (36.8 C), temperature source Oral, resp. rate 16, last menstrual period 06/10/2011, SpO2 100 %. Physical Exam:  General: pleasant, WD, WN white female who is laying in bed in NAD HEENT: head is normocephalic, atraumatic.  Sclera are noninjected.  PERRL.  Ears and nose without any masses or lesions.  Mouth is pink and moist Heart: regular, rate, and rhythm.  Normal s1,s2. No obvious murmurs, gallops, or rubs noted.  Palpable radial and pedal pulses bilaterally Lungs: CTAB, no wheezes, rhonchi, or rales noted.  Respiratory effort nonlabored Abd: soft, NT, ND, +BS, no masses, hernias, or organomegaly MS: all 4 extremities are symmetrical with no cyanosis, clubbing, or edema. Skin: warm and dry with no masses, lesions, or rashes Neuro: Cranial nerves 2-12 grossly intact, sensation grossly intact throughout Psych: A&Ox3 with an appropriate affect.   Results for orders placed or performed during the hospital encounter of 06/27/20 (from the past 48 hour(s))   Lipase, blood     Status: None   Collection Time: 06/27/20  7:27 AM  Result Value Ref Range   Lipase 22 11 - 51 U/L    Comment: Performed at [facility], [place] [address], [place], [place] [address]  Comprehensive metabolic panel     Status: Abnormal   Collection Time: 06/27/20  7:27 AM  Result Value Ref Range   Sodium 140 135 - 145 mmol/L   Potassium 2.9 (L) 3.5 - 5.1 mmol/L   Chloride 98 98 - 111 mmol/L   CO2 29 22 - 32 mmol/L   Glucose, Bld 149 (H) 70 - 99 mg/dL    Comment: Glucose reference range applies only to samples taken after fasting for at least 8 hours.   BUN 12 6 - 20 mg/dL   Creatinine, Ser 0.85 0.44 - 1.00 mg/dL   Calcium 9.2 8.9 - 10.3 mg/dL   Total Protein 7.1 6.5 - 8.1 g/dL   Albumin 3.3 (L) 3.5 - 5.0 g/dL   AST 13 (L) 15 - 41 U/L   ALT 10 0 - 44 U/L   Alkaline Phosphatase 61 38 - 126 U/L   Total Bilirubin 0.5 0.3 - 1.2 mg/dL   GFR  calc non Af Amer >60 >60 mL/min   GFR calc Af Amer >60 >60 mL/min   Anion gap 13 5 - 15    Comment: Performed at [facility], [place] [address], [place], [place] [address]  CBC     Status: Abnormal   Collection Time: 06/27/20  7:27 AM  Result Value Ref Range   WBC 12.8 (H) 4.0 - 10.5 K/uL   RBC 5.31 (H) 3.87 - 5.11 MIL/uL   Hemoglobin 15.2 (H) 12.0 - 15.0 g/dL   HCT 45.7 36 - 46 %   MCV 86.1 80.0 - 100.0 fL   MCH 28.6 26.0 - 34.0 pg   MCHC 33.3 30.0 - 36.0 g/dL   RDW 13.7 11.5 - 15.5 %   Platelets 384 150 - 400 K/uL   nRBC 0.0 0.0 - 0.2 %    Comment: Performed at [facility], [place] [address] [place], [place] [address]  Magnesium     Status: None   Collection Time: 06/27/20  7:27 AM  Result Value Ref Range   Magnesium 1.9 1.7 - 2.4 mg/dL    Comment: Performed at [facility], [place] [address], [place], [place] [address]  Urinalysis, Routine w reflex microscopic Urine, Clean Catch     Status: Abnormal   Collection Time: 06/27/20 10:37 AM  Result Value  Ref Range   Color, Urine YELLOW YELLOW   APPearance HAZY (A) CLEAR   Specific Gravity, Urine 1.018 1.005 - 1.030   pH 7.0 5.0 - 8.0   Glucose, UA NEGATIVE NEGATIVE mg/dL   Hgb urine dipstick NEGATIVE NEGATIVE   Bilirubin Urine NEGATIVE NEGATIVE   Ketones, ur NEGATIVE NEGATIVE mg/dL   Protein, ur 30 (A) NEGATIVE mg/dL   Nitrite POSITIVE (A) NEGATIVE   Leukocytes,Ua TRACE (A) NEGATIVE   RBC / HPF 0-5 0 - 5 RBC/hpf   WBC, UA 6-10 0 - 5 WBC/hpf   Bacteria, UA MANY (A) NONE SEEN   Squamous Epithelial / LPF 0-5 0 - 5   Mucus PRESENT    Hyaline Casts, UA PRESENT     Comment: Performed at [facility], [place] [address] [place], [place] [address]   DG Abdomen 1 View  Result Date: 06/27/2020 CLINICAL DATA:  Crohn's disease with concern for bowel obstruction EXAM: ABDOMEN - 1 VIEW COMPARISON:  CT abdomen and pelvis June 12, 2020 FINDINGS: There are multiple loops of dilated small bowel without appreciable air-fluid level. No free air appreciable on supine examination. A small amount of air is noted scattered throughout the colon and rectum. Visualized lung bases are clear. IMPRESSION: Loops of dilated small bowel in a pattern concerning for a degree of bowel obstruction. No free air evident. Electronically Signed   By: Lowella Grip III M.D.   On: 06/27/2020 08:27      Assessment/Plan HTN GERD RLS Depression/Anxiety  Crohn's ileitis with SBO and fistulas - outpt CT showed 8/11 showed high-grade sbo, E-E fistulas and fistulous connection to sigmoid colon - patient presents with worsening symptoms - WBC 12 - recommend repeat CT - consider NGT if patient continuing to vomit - GI following as well - no indication for emergent surgical intervention at this time based on abdominal exam, will follow up on CT later today   FEN: ice chips, IVF VTE: ok to have lovenox from a surgery standpoint ID: no current abx   Norm Parcel, [facility]  Surgery 06/27/2020, 11:12 AM Please see Amion for  pager number during day hours 7:00am-4:30pm

## 2020-06-27 NOTE — ED Provider Notes (Signed)
[facility] DEPT Provider Note   CSN: [id number] Arrival date & time: 06/27/20  5597     History Chief Complaint  Patient presents with  . Abdominal Pain    Cynthia [name] is a 59 y.o. female.  HPI Patient is a 59 year old female with a history of Crohn's ileitis who presents due to abdominal pain.  Patient states that yesterday morning she began experiencing episodes of abdominal pain started in the left upper quadrant and radiated across her upper abdomen.  These will last for short time and spontaneously alleviate.  They have been occurring every 10 to 15 minutes.  She reports an episode of diarrhea yesterday morning which she describes as watery brown.  Since his episode, she has not had any additional diarrhea and denies passage of gas.  She reports nausea with 4 episodes of associated vomiting.  No hematemesis.  Her last episode of vomiting was just prior to arrival.  She reports worsening sx with PO intake and due to this has not had any significant PO intake. She takes Humira for her Crohn's disease which she administers herself every 2 weeks.  Her next dose was due 1 week ago but she did not continue this dose because after speaking to her gastroenterologist it does not appear that her Humira is effective. No SOB, CP, fevers, chills, URI sx, dysuria, syncope.  Her gastroenterologist Dr. Fuller Plan ordered a CT of the abdomen and pelvis with contrast on August 10 with findings as noted below:  1. 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 2. Mild mesenteric adenopathy in interloop fluid, likely secondary. No drainable abscess.  Pt has follow up with GI in early October. They are considering d/c her Humira and starting her on Remicade.      Past Medical History:  Diagnosis Date  .  Anemia 2012  . Anxiety   . Bronchitis   . Crohn's ileitis ([place])   . Depression   . GERD (gastroesophageal reflux disease)   . Headache(784.0)    MIGRAINE  . Heart murmur   . Hiatal hernia   . Hypertension   . IBS (irritable bowel syndrome)   . Incontinence   . MVP (mitral valve prolapse)   . RLS (restless legs syndrome)   . Tubular adenoma of colon 08/2019    Patient Active Problem List   Diagnosis Date Noted  . Crohn's ileitis, with intestinal obstruction ([place]) 10/05/2019  . Colitis   . Rectal polyp   . SBO (small bowel obstruction) ([place]) 08/26/2019  . 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  . RLS (restless legs syndrome) 09/25/2014  . Current smoker 09/19/2011  . Migraine headache 09/19/2011  . Hypertension 09/19/2011  . Obesity (BMI 30-39.9) 09/19/2011  . GERD 03/17/2008  . MVP (mitral valve prolapse) 03/17/2008  . HIATAL HERNIA 05/03/2002    Past Surgical History:  Procedure Laterality Date  . ABDOMINAL HYSTERECTOMY  06/17/2011   Procedure: HYSTERECTOMY ABDOMINAL;  Surgeon: Arloa Koh;  Location: [facility];  Service: Gynecology;  Laterality: N/A;  Converted to Abdominal Hysterectomy with lysis of adhesions   . ANTERIOR AND POSTERIOR REPAIR  02/25/2012   Procedure: ANTERIOR (CYSTOCELE) AND POSTERIOR REPAIR (RECTOCELE);  Surgeon: Daria Pastures, MD;  Location: [facility];  Service: Gynecology;  Laterality: N/A;  ANterior cystocele repair ONLY  . BIOPSY  08/29/2019   Procedure: BIOPSY;  Surgeon: Gerrit Heck  V, DO;  Location: [facility];  Service: Gastroenterology;;  . BLADDER SUSPENSION  02/25/2012   Procedure: TRANSVAGINAL TAPE (TVT) PROCEDURE;  Surgeon: Daria Pastures, MD;  Location: [facility];  Service: Gynecology;  Laterality: N/A;  . COLONOSCOPY WITH PROPOFOL N/A 08/29/2019   Procedure: COLONOSCOPY WITH PROPOFOL;  Surgeon: Lavena Bullion, DO;  Location: [facility];  Service: Gastroenterology;  Laterality: N/A;  . CYSTOSCOPY  02/25/2012   Procedure: CYSTOSCOPY;   Surgeon: Daria Pastures, MD;  Location: [facility];  Service: Gynecology;  Laterality: N/A;  . KNEE ARTHROSCOPY  2010  . LAPAROSCOPIC ASSISTED VAGINAL HYSTERECTOMY  06/17/2011   Procedure: LAPAROSCOPIC ASSISTED VAGINAL HYSTERECTOMY;  Surgeon: Arloa Koh;  Location: [facility];  Service: Gynecology;  Laterality: N/A;  Attempted   . POLYPECTOMY  08/29/2019   Procedure: POLYPECTOMY;  Surgeon: Lavena Bullion, DO;  Location: [facility];  Service: Gastroenterology;;  . SALPINGOOPHORECTOMY  06/17/2011   Procedure: SALPINGO OOPHERECTOMY;  Surgeon: Arloa Koh;  Location: [facility];  Service: Gynecology;  Laterality: Bilateral;  . TONSILLECTOMY       OB History   No obstetric history on file.     Family History  Problem Relation Age of Onset  . Diabetes Mother   . Dementia Mother   . Clotting disorder Father   . Macular degeneration Father   . Crohn's disease Sister   . Colon cancer Neg Hx   . Esophageal cancer Neg Hx   . Pancreatic cancer Neg Hx   . Stomach cancer Neg Hx   . Liver disease Neg Hx     Social History   Tobacco Use  . Smoking status: Current Every Day Smoker    Packs/day: 0.50    Types: Cigarettes    Last attempt to quit: 12/07/2011    Years since quitting: 8.5  . Smokeless tobacco: Never Used  Vaping Use  . Vaping Use: Never used  Substance Use Topics  . Alcohol use: Yes    Comment: socially; seldom  . Drug use: Yes    Types: Cocaine    Comment: socially; d/c 3 weeks ago per patient 05/31/20    Home Medications Prior to Admission medications   Medication Sig Start Date End Date Taking? Authorizing Provider  Adalimumab (HUMIRA PEN [place]) Inject 40 mg into the skin.    [provider]  dicyclomine (BENTYL) 10 MG capsule Take 1 capsule (10 mg total) by mouth 3 (three) times daily before meals. 05/17/20   [name], MD  esomeprazole (NEXIUM) 40 MG capsule TAKE ONE (1) CAPSULE BY MOUTH 2 TIMES DAILY 03/26/20   Denita Lung, MD   losartan-hydrochlorothiazide (HYZAAR) 50-12.5 MG tablet Take 1 tablet by mouth daily. 03/26/20   Denita Lung, MD  mercaptopurine (PURINETHOL) 50 MG tablet TAKE 2 TABLETS BY MOUTH DAILY. GIVE ON AN EMPTY STOMACH 1 HOUR BEFORE OR 2 HOURS AFTER MEALS. CAUTION: CHEMOTHERAPY. 05/28/20   [name], MD  ondansetron (ZOFRAN) 4 MG tablet Take 1 tablet (4 mg total) by mouth 3 (three) times daily as needed for nausea or vomiting. 05/16/20   [name], MD  Polyvinyl Alcohol-Povidone (REFRESH OP) Place 1 drop into both eyes daily as needed. Reported on 05/08/2016    [provider]  pramipexole (MIRAPEX) 1 MG tablet Take 1 tablet by mouth daily 03/26/20   Denita Lung, MD  promethazine (PHENERGAN) 25 MG tablet Take 1 tablet (25 mg total) by mouth every 6 (six) hours as needed for  nausea or vomiting. 12/20/19   Esterwood, Amy S, PA-C  venlafaxine XR (EFFEXOR-XR) 150 MG 24 hr capsule Take 2 capsules by mouth daily 03/26/20   Denita Lung, MD    Allergies    Iodine  Review of Systems   Review of Systems  All other systems reviewed and are negative. Ten systems reviewed and are negative for acute change, except as noted in the HPI.   Physical Exam Updated Vital Signs BP (!) 156/107 (BP Location: Left Arm)   Pulse 93   Temp 98.2 F (36.8 C) (Oral)   Resp 16   LMP 06/10/2011 (Exact Date)   SpO2 100%   Physical Exam Vitals and nursing note reviewed.  Constitutional:      General: She is not in acute distress.    Appearance: Normal appearance. She is well-developed. She is not ill-appearing, toxic-appearing or diaphoretic.  HENT:     Head: Normocephalic and atraumatic.     Right Ear: External ear normal.     Left Ear: External ear normal.     Nose: Nose normal.     Mouth/Throat:     Mouth: Mucous membranes are moist.     Pharynx: Oropharynx is clear. No oropharyngeal exudate or posterior oropharyngeal erythema.  Eyes:     Extraocular Movements: Extraocular movements  intact.  Cardiovascular:     Rate and Rhythm: Normal rate and regular rhythm.     Pulses: Normal pulses.     Heart sounds: Normal heart sounds. No murmur heard.  No friction rub. No gallop.   Pulmonary:     Effort: Pulmonary effort is normal. No respiratory distress.     Breath sounds: Normal breath sounds. No stridor. No wheezing, rhonchi or rales.  Abdominal:     General: Abdomen is flat and protuberant. Bowel sounds are increased.     Palpations: Abdomen is soft.     Tenderness: There is no abdominal tenderness. There is no guarding or rebound.  Musculoskeletal:        General: Normal range of motion.     Cervical back: Normal range of motion and neck supple. No tenderness.  Skin:    General: Skin is warm and dry.  Neurological:     General: No focal deficit present.     Mental Status: She is alert and oriented to person, place, and time.  Psychiatric:        Mood and Affect: Mood normal.        Behavior: Behavior normal.    ED Results / Procedures / Treatments   Labs (all labs ordered are listed, but only abnormal results are displayed) Labs Reviewed  COMPREHENSIVE METABOLIC PANEL - Abnormal; Notable for the following components:      Result Value   Potassium 2.9 (*)    Glucose, Bld 149 (*)    Albumin 3.3 (*)    AST 13 (*)    All other components within normal limits  CBC - Abnormal; Notable for the following components:   WBC 12.8 (*)    RBC 5.31 (*)    Hemoglobin 15.2 (*)    All other components within normal limits  SARS CORONAVIRUS 2 BY RT PCR (HOSPITAL ORDER, [facility])  LIPASE, BLOOD  MAGNESIUM  URINALYSIS, ROUTINE W REFLEX MICROSCOPIC   EKG None  Radiology DG Abdomen 1 View  Result Date: 06/27/2020 CLINICAL DATA:  Crohn's disease with concern for bowel obstruction EXAM: ABDOMEN - 1 VIEW COMPARISON:  CT abdomen and pelvis  June 12, 2020 FINDINGS: There are multiple loops of dilated small bowel without appreciable air-fluid  level. No free air appreciable on supine examination. A small amount of air is noted scattered throughout the colon and rectum. Visualized lung bases are clear. IMPRESSION: Loops of dilated small bowel in a pattern concerning for a degree of bowel obstruction. No free air evident. Electronically Signed   By: Lowella Grip III M.D.   On: 06/27/2020 08:27   Procedures Procedures   Medications Ordered in ED Medications  sodium chloride 0.9 % bolus 1,000 mL (1,000 mLs Intravenous New Bag/Given 06/27/20 0810)  morphine 4 MG/ML injection 4 mg (4 mg Intravenous Given 06/27/20 0824)  potassium chloride 10 mEq in 100 mL IVPB (10 mEq Intravenous New Bag/Given 06/27/20 0940)  potassium chloride SA (KLOR-CON) CR tablet 40 mEq (40 mEq Oral Given 06/27/20 0831)  magnesium sulfate IVPB 1 g 100 mL (1 g Intravenous New Bag/Given 06/27/20 0940)   ED Course  I have reviewed the triage vital signs and the nursing notes.  Pertinent labs & imaging results that were available during my care of the patient were reviewed by me and considered in my medical decision making (see chart for details).  Clinical Course as of Jun 27 1041  Wed Jun 27, 2020  0814 Given Klor-Con as well as IV potassium.  Will give additional IV magnesium and check a magnesium level.  Potassium(!): 2.9 [LJ]  0815 WBC(!): 12.8 [LJ]  0816 Pt reassessed and now complains of worsening pain. Will give IV morphine.    [LJ]  1779 Loops of dilated small bowel in a pattern concerning for a degree of bowel obstruction. No free air evident.    DG Abdomen 1 View [LJ]  [id number] Discussed with GI. Believe this is likely a SBO given her sx and don't feel that moving forward with CT imaging of her abd/pelvis is necessary at this time. Recommend that pt be admitted and they will start her on Solu-Medrol.  Will obtain COVID-19 swab and will discuss with hospitalist team for admission.    [LJ]  27 I spoke to general surgery and they agree with GI's plan. They  will consult on the patient as well and will order additional imaging if they feel it is warranted.    [LJ]    Clinical Course User Index [LJ] Rayna Sexton, PA-C   MDM Rules/Calculators/A&P                          Pt is a 59 y.o. female that presents with a history, physical exam, and ED Clinical Course as noted above.   Patient presents today with worsening intermittent upper abdominal pain.  She has a history of Crohn's. CT scan obtained two weeks ago by GI concerning for partial SBO.   Patient given IV fluids as well as pain medication while in the emergency department.  Patient discussed with [facility] who recommends admission of the patient.  Additionally discussed with general surgery who will consult on the patient.  We will discuss with the hospitalist team for admission.  COVID-19 test ordered.  Note: Portions of this report may have been transcribed using voice recognition software. Every effort was made to ensure accuracy; however, inadvertent computerized transcription errors may be present.   Final Clinical Impression(s) / ED Diagnoses Final diagnoses:  SBO (small bowel obstruction) ([place])   Rx / DC Orders ED Discharge Orders    None  Rayna Sexton, PA-C 06/27/20 [place], [place], DO 06/27/20 1119

## 2020-06-27 NOTE — H&P (Signed)
History and Physical    Cynthia [name] JZP:[id number] DOB: 05-02-1961 DOA: 06/27/2020  PCP: Denita Lung, MD  Patient coming from: Home  Chief Complaint: Abdominal pain  HPI: Cynthia [name] is a 59 y.o. female with medical history significant of Crohn's disease, depression, GERD who presents to the emergency department with complaints of progressively worsening abdominal discomfort.  Patient has Crohn's disease and is followed closely by gastroenterology.  Patient did report some diarrhea 1 day prior to hospital admission  ED Course: In the emergency department, patient underwent abdominal x-ray with findings consistent with bowel obstruction.  Given progressive Crohn's disease with obstruction, gastroenterology and general surgery were consulted.  Hospitalist consulted for consideration for medical admission  Review of Systems:  Review of Systems  Constitutional: Positive for malaise/fatigue. Negative for chills and weight loss.  HENT: Negative for ear discharge, ear pain and nosebleeds.   Eyes: Negative for double vision and photophobia.  Respiratory: Negative for hemoptysis, sputum production and shortness of breath.   Cardiovascular: Negative for palpitations, orthopnea and claudication.  Gastrointestinal: Positive for abdominal pain, diarrhea, nausea and vomiting.  Genitourinary: Negative for frequency and urgency.  Musculoskeletal: Negative for back pain, falls and neck pain.  Neurological: Negative for speech change, focal weakness, seizures and loss of consciousness.  Psychiatric/Behavioral: Negative for hallucinations and memory loss. The patient is not nervous/anxious.     Past Medical History:  Diagnosis Date  . Anemia 2012  . Anxiety   . Bronchitis   . Crohn's ileitis ([place])   . Depression   . GERD (gastroesophageal reflux disease)   . Headache(784.0)    MIGRAINE  . Heart murmur   . Hiatal hernia   . Hypertension   . IBS (irritable bowel syndrome)   .  Incontinence   . MVP (mitral valve prolapse)   . RLS (restless legs syndrome)   . Tubular adenoma of colon 08/2019    Past Surgical History:  Procedure Laterality Date  . ABDOMINAL HYSTERECTOMY  06/17/2011   Procedure: HYSTERECTOMY ABDOMINAL;  Surgeon: Arloa Koh;  Location: [facility];  Service: Gynecology;  Laterality: N/A;  Converted to Abdominal Hysterectomy with lysis of adhesions   . ANTERIOR AND POSTERIOR REPAIR  02/25/2012   Procedure: ANTERIOR (CYSTOCELE) AND POSTERIOR REPAIR (RECTOCELE);  Surgeon: Daria Pastures, MD;  Location: [facility];  Service: Gynecology;  Laterality: N/A;  ANterior cystocele repair ONLY  . BIOPSY  08/29/2019   Procedure: BIOPSY;  Surgeon: Lavena Bullion, DO;  Location: [facility];  Service: Gastroenterology;;  . BLADDER SUSPENSION  02/25/2012   Procedure: TRANSVAGINAL TAPE (TVT) PROCEDURE;  Surgeon: Daria Pastures, MD;  Location: [facility];  Service: Gynecology;  Laterality: N/A;  . COLONOSCOPY WITH PROPOFOL N/A 08/29/2019   Procedure: COLONOSCOPY WITH PROPOFOL;  Surgeon: Lavena Bullion, DO;  Location: [facility];  Service: Gastroenterology;  Laterality: N/A;  . CYSTOSCOPY  02/25/2012   Procedure: CYSTOSCOPY;  Surgeon: Daria Pastures, MD;  Location: [facility];  Service: Gynecology;  Laterality: N/A;  . KNEE ARTHROSCOPY  2010  . LAPAROSCOPIC ASSISTED VAGINAL HYSTERECTOMY  06/17/2011   Procedure: LAPAROSCOPIC ASSISTED VAGINAL HYSTERECTOMY;  Surgeon: Arloa Koh;  Location: [facility];  Service: Gynecology;  Laterality: N/A;  Attempted   . POLYPECTOMY  08/29/2019   Procedure: POLYPECTOMY;  Surgeon: Lavena Bullion, DO;  [facility];  Service: Gastroenterology;;  . SALPINGOOPHORECTOMY  06/17/2011   Procedure: SALPINGO OOPHERECTOMY;  Surgeon: Arloa Koh;  Location: [facility];  Service: Gynecology;  Laterality: Bilateral;  . TONSILLECTOMY       reports that she has been smoking cigarettes. She has been smoking about 0.50 packs per day. She  has never used smokeless tobacco. She reports current alcohol use. She reports current drug use. Drug: Cocaine.  Allergies  Allergen Reactions  . Iodine     REACTION: in eye gtts Iodine eye drops; pt has no reactions to CT contrast    Family History  Problem Relation Age of Onset  . Diabetes Mother   . Dementia Mother   . Clotting disorder Father   . Macular degeneration Father   . Crohn's disease Sister   . Colon cancer Neg Hx   . Esophageal cancer Neg Hx   . Pancreatic cancer Neg Hx   . Stomach cancer Neg Hx   . Liver disease Neg Hx     Prior to Admission medications   Medication Sig Start Date End Date Taking? Authorizing Provider  Adalimumab (HUMIRA PEN [place]) Inject 40 mg into the skin.    [provider]  dicyclomine (BENTYL) 10 MG capsule Take 1 capsule (10 mg total) by mouth 3 (three) times daily before meals. 05/17/20   [name], MD  esomeprazole (NEXIUM) 40 MG capsule TAKE ONE (1) CAPSULE BY MOUTH 2 TIMES DAILY 03/26/20   Denita Lung, MD  losartan-hydrochlorothiazide (HYZAAR) 50-12.5 MG tablet Take 1 tablet by mouth daily. 03/26/20   Denita Lung, MD  mercaptopurine (PURINETHOL) 50 MG tablet TAKE 2 TABLETS BY MOUTH DAILY. GIVE ON AN EMPTY STOMACH 1 HOUR BEFORE OR 2 HOURS AFTER MEALS. CAUTION: CHEMOTHERAPY. 05/28/20   [name], MD  ondansetron (ZOFRAN) 4 MG tablet Take 1 tablet (4 mg total) by mouth 3 (three) times daily as needed for nausea or vomiting. 05/16/20   [name], MD  Polyvinyl Alcohol-Povidone (REFRESH OP) Place 1 drop into both eyes daily as needed. Reported on 05/08/2016    [provider]  pramipexole (MIRAPEX) 1 MG tablet Take 1 tablet by mouth daily 03/26/20   Denita Lung, MD  promethazine (PHENERGAN) 25 MG tablet Take 1 tablet (25 mg total) by mouth every 6 (six) hours as needed for nausea or vomiting. 12/20/19   Esterwood, Amy S, PA-C  venlafaxine XR (EFFEXOR-XR) 150 MG 24 hr capsule Take 2 capsules by mouth daily  03/26/20   Denita Lung, MD    Physical Exam: Vitals:   06/27/20 0716 06/27/20 0915  BP: (!) 156/107 (!) 153/96  Pulse: 93 87  Resp: 16 16  Temp: 98.2 F (36.8 C)   TempSrc: Oral   SpO2: 100% 100%    Constitutional: NAD, calm, comfortable Vitals:   06/27/20 0716 06/27/20 0915  BP: (!) 156/107 (!) 153/96  Pulse: 93 87  Resp: 16 16  Temp: 98.2 F (36.8 C)   TempSrc: Oral   SpO2: 100% 100%   Eyes: PERRL, lids and conjunctivae normal ENMT: Mucous membranes are moist. Posterior pharynx clear of any exudate or lesions.Normal dentition.  Neck: normal, supple, no masses, no thyromegaly Respiratory: clear to auscultation bilaterally, no wheezing, no crackles. Normal respiratory effort. No accessory muscle use.  Cardiovascular: Regular rate and rhythm,. No extremity edema. 2+ pedal pulses.  Abdomen: no tenderness, no masses palpated. No hepatosplenomegaly. Bowel sounds positive.  Musculoskeletal: no clubbing / cyanosis. No joint deformity upper and lower extremities. Good ROM, no contractures. Normal muscle tone.  Skin: no rashes, lesions, ulcers. No induration Neurologic: CN 2-12 grossly intact. Sensation intact,. Strength  5/5 in all 4.  Psychiatric: Normal judgment and insight. Alert and oriented x 3. Normal mood.    Labs on Admission: I have personally reviewed following labs and imaging studies  CBC: Recent Labs  Lab 06/27/20 0727  WBC 12.8*  HGB 15.2*  HCT 45.7  MCV 86.1  PLT 732   Basic Metabolic Panel: Recent Labs  Lab 06/27/20 0727  NA 140  K 2.9*  CL 98  CO2 29  GLUCOSE 149*  BUN 12  CREATININE 0.85  CALCIUM 9.2  MG 1.9   GFR: CrCl cannot be calculated (Unknown ideal weight.). Liver Function Tests: Recent Labs  Lab 06/27/20 0727  AST 13*  ALT 10  ALKPHOS 61  BILITOT 0.5  PROT 7.1  ALBUMIN 3.3*   Recent Labs  Lab 06/27/20 0727  LIPASE 22   No results for input(s): AMMONIA in the last 168 hours. Coagulation Profile: No results for  input(s): INR, PROTIME in the last 168 hours. Cardiac Enzymes: No results for input(s): CKTOTAL, CKMB, CKMBINDEX, TROPONINI in the last 168 hours. BNP (last 3 results) No results for input(s): PROBNP in the last 8760 hours. HbA1C: No results for input(s): HGBA1C in the last 72 hours. CBG: No results for input(s): GLUCAP in the last 168 hours. Lipid Profile: No results for input(s): CHOL, HDL, LDLCALC, TRIG, CHOLHDL, LDLDIRECT in the last 72 hours. Thyroid Function Tests: No results for input(s): TSH, T4TOTAL, FREET4, T3FREE, THYROIDAB in the last 72 hours. Anemia Panel: No results for input(s): VITAMINB12, FOLATE, FERRITIN, TIBC, IRON, RETICCTPCT in the last 72 hours. Urine analysis:    Component Value Date/Time   COLORURINE AMBER (A) 08/25/2019 2020   APPEARANCEUR HAZY (A) 08/25/2019 2020   LABSPEC 1.027 08/25/2019 2020   LABSPEC 1.025 05/13/2018 1018   PHURINE 5.0 08/25/2019 2020   GLUCOSEU NEGATIVE 08/25/2019 2020   HGBUR NEGATIVE 08/25/2019 2020   BILIRUBINUR NEGATIVE 08/25/2019 2020   BILIRUBINUR negative 05/13/2018 1018   BILIRUBINUR n 12/04/2011 1014   KETONESUR 20 (A) 08/25/2019 2020   PROTEINUR 100 (A) 08/25/2019 2020   UROBILINOGEN negative 12/04/2011 1014   NITRITE NEGATIVE 08/25/2019 2020   LEUKOCYTESUR NEGATIVE 08/25/2019 2020   Sepsis Labs: !!!!!!!!!!!!!!!!!!!!!!!!!!!!!!!!!!!!!!!!!!!! @LABRCNTIP (procalcitonin:4,lacticidven:4) )No results found for this or any previous visit (from the past 240 hour(s)).   Radiological Exams on Admission: DG Abdomen 1 View  Result Date: 06/27/2020 CLINICAL DATA:  Crohn's disease with concern for bowel obstruction EXAM: ABDOMEN - 1 VIEW COMPARISON:  CT abdomen and pelvis June 12, 2020 FINDINGS: There are multiple loops of dilated small bowel without appreciable air-fluid level. No free air appreciable on supine examination. A small amount of air is noted scattered throughout the colon and rectum. Visualized lung bases are clear.  IMPRESSION: Loops of dilated small bowel in a pattern concerning for a degree of bowel obstruction. No free air evident. Electronically Signed   By: Lowella Grip III M.D.   On: 06/27/2020 08:27    EKG: Independently reviewed. Most recent EKG from 11/15/15. Sinus with QTc 447  Assessment/Plan Principal Problem:   Crohn's ileitis, with intestinal obstruction (HCC) Active Problems:   GERD   Hypertension   Obesity (BMI 30-39.9)   Crohn disease ([place])   1. Crohn's with bowel obstruction 1. Followed closely by [facility].  Outpatient records reviewed. 2. Patient had been on chronic immunosuppression prior to admission 3. GI and gastroenterology both consulted through [facility] 4. Admit patient to MedSurg 5. Per GI, recommendation for repeat CT scan, next management decision pending  results of CT scan 6. General surgery following 7. Keep n.p.o. for now until cleared by GI/general surgery 8. Check CBC in the morning 2. GERD 1. Seems to be stable at this time 3. Hypertension 1. Pressure suboptimally controlled 2. Given n.p.o. status, will continue on scheduled Lopressor as tolerated 4. Obesity 1. Recommend diet and lifestyle modification  DVT prophylaxis: Lovenox subq  Code Status: Full Family Communication: Pt in room  Disposition Plan:   Consults called: GI, General Surgery Admission status: Patient has it would likely require greater than 2 midnight stay to resolve intestinal obstruction  Marylu Lund MD [facility] Pager On Amion  If 7PM-7AM, please contact night-coverage  06/27/2020, 10:55 AM

## 2020-06-27 NOTE — Consult Note (Addendum)
Referring Provider:  [facility]         Primary Care Physician:  Denita Lung, MD Primary Gastroenterologist:   Lucio Edward, MD            We were asked to see this patient for:    Crohn's / abdominal pain and vomiting              ASSESSMENT /  PLAN    Cynthia [name] is a 59 y.o. female PMH significant for, but not necessarily limited to,   adenomatous colon polyps, Crohn's ileitis, GERD, hypertension,  Hysterectomy  # Crohn's ileitis with disease progression on Humira. 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 --Given further progression of symptoms since CT enterography will obtain CT scan today.  Patient does not feel she is able to tolerate oral contrast so we will do it with IV contrast only --Await results of CT scan then will decide on initiation of steroids. She may not be able to tolerate oral contrast --IV fluid repletion --Analgesics, antiemetics as needed                                                                                                                                 # GERD --on Nexium at home. Will give IV Protonix since vomiting.   # Hx of HTN. BP is quite elevated in the ED  # Hypokalemia, --repletion in progress   HPI:    Chief Complaint:   Cynthia [name] is a 59 y.o. female diagnosed with Crohn's ileitis during hospital admission around October 2020 after presenting with intermittent abdominal pain, weight loss, nausea vomiting, diarrhea.  At that time she was discharged from the hospital prednisone with substantial improvement in symptoms. We subsequently started her on Imuran but was unable to afford the medication after losing job. In December 2020 she still had not started Imuran so we continue slow prednisone taper. She had a hard time  tapering off the steroids. She finally was able to get the 17m. She still wasn't doing well by time of follow up in mid February 2021 so steroids continued with plans to look into Biologics. We started processing paperwork to help her qualify for Humira. She was able to obtain and start Humira late May. At some point Imuran was stopped as she isn't taking it now. She continued to have severe upper abdominal pain and symptoms of possible intermittent partial small bowel obstruction.  Labs in late July remarkable for sed rate of 48, normal CRP.  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  Patient next office visit isn't until early October.  Patient indicates she has never really gone into remission on Humira.  She was due for injection last Thursday but has not taken it.  Over the last few days her symptoms including nausea, vomiting and abdominal pain have gotten significantly worse.  She has been vomiting numerous times a day.  She is having generalized mid abdominal pain which is intermittent, comes in waves. The pain frequently hurts on one side of the abdomen and shoots across to the other side.  Yesterday she had diarrhea, no bowel movement since.  Possible low grade fevers at home. She is fully vaccinated against Covid.     PREVIOUS ENDOSCOPIC EVALUATIONS / GI STUDIES :  06/12/20 CT enterography abdomen and pelvis with contrast  COMPARISON:  08/26/2019  FINDINGS: Lower chest: Clear lung bases. Normal heart size without pericardial or pleural effusion.  Hepatobiliary: Normal liver. Normal gallbladder, without biliary ductal dilatation.  Pancreas: Normal, without mass or ductal dilatation.  Spleen: Normal in size, without focal abnormality.  Adrenals/Urinary Tract: Normal adrenal  glands. Normal kidneys, without hydronephrosis. Normal bladder. No air within or wall thickening of.  Stomach/Bowel: Normal stomach, without wall thickening. The descending and transverse colon are decompressed. Normal appendix.  The extent of small bowel distension with neutral contrast is good. Normal terminal ileum, including on 27/4.  The proximal small bowel is moderately dilated, increased. Example small-bowel loop at 4.4 cm on 208/4. Followed to the level of an area of inflamed distal ileum. Example on 314/4. Multiple areas of small bowel/small bowel fistula are identified in this area (example image 312/4.) the extent of inflammation is increased compared to 08/26/2019.  Suspect fistulous communication to the sigmoid including on 03/20 5/4.  No free extraluminal gas or well-defined fluid collection. Minimal interloop fluid is identified.  Vascular/Lymphatic: Aortic atherosclerosis. Mild ileocolic mesenteric adenopathy is presumably reactive. Example 7 mm on 57/2.  Reproductive: Hysterectomy.  No adnexal mass.  Other: Small volume pelvic fluid.  Musculoskeletal: No sacroiliitis. Minimal degenerative sclerosis of the bilateral sacroiliac joints. Mild S shaped thoracolumbar spine curvature.  IMPRESSION: 1. 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 2. Mild mesenteric adenopathy in interloop fluid, likely secondary. No drainable abscess.   08/28/20 Colonoscopy There was significant looping of the colon. - Two 3 to 4 mm polyps in the rectum and at the recto-sigmoid colon, removed with a cold snare. Resected and retrieved. - Vascular-pattern-decreased mucosa in the descending colon, in the transverse colon, in the ascending colon and in the cecum. Biopsied. - Ileitis. Biopsied. Clinical  presentation and endoscopic appearance highly suspicious for Crohns Disease. FINAL MICROSCOPIC DIAGNOSIS:   A. COLON, ILEUM, BIOPSY:  Small bowel mucosa with granulation tissue and exudate consistent with  ulcer.  Differential includes Crohn's.  No granulomas identified.   B. COLON, ASCENDING, BIOPSY:  Colonic mucosa with no significant pathologic changes.  No active inflammation, chronic changes or granulomas.   C. COLON, TRANSVERSE, BIOPSY:  Colonic mucosa with no significant pathologic changes.  No active inflammation, chronic changes or granulomas.   D. COLON, DESCENDING, BIOPSY:  Colonic mucosa with no significant pathologic changes.  No active inflammation, chronic changes or granulomas.   E. COLON, SIGMOID, BIOPSY:  Colonic mucosa with no significant pathologic changes.  No active inflammation, chronic changes or granulomas.   F. RECTUM, BIOPSY:  Colonic mucosa with no significant pathologic changes.  Noactive inflammation, chronic changes or granulomas.  G. RECTUM, POLYPECTOMY:  Tubular adenoma (s).  No high-grade dysplasia or carcinoma.    Past Medical History:  Diagnosis Date  . Anemia 2012  . Anxiety   . Bronchitis   . Crohn's ileitis ([place])   . Depression   . GERD (gastroesophageal reflux disease)   . Headache(784.0)    MIGRAINE  . Heart murmur   . Hiatal hernia   . Hypertension   . IBS (irritable bowel syndrome)   . Incontinence   . MVP (mitral valve prolapse)   . RLS (restless legs syndrome)   . Tubular adenoma of colon 08/2019    Past Surgical History:  Procedure Laterality Date  . ABDOMINAL HYSTERECTOMY  06/17/2011   Procedure: HYSTERECTOMY ABDOMINAL;  Surgeon: Arloa Koh;  Location: [facility];  Service: Gynecology;  Laterality: N/A;  Converted to Abdominal Hysterectomy with lysis of adhesions   . ANTERIOR AND POSTERIOR REPAIR  02/25/2012   Procedure: ANTERIOR (CYSTOCELE) AND POSTERIOR REPAIR (RECTOCELE);  Surgeon: Daria Pastures, MD;   Location: [facility];  Service: Gynecology;  Laterality: N/A;  ANterior cystocele repair ONLY  . BIOPSY  08/29/2019   Procedure: BIOPSY;  Surgeon: Lavena Bullion, DO;  Location: [facility];  Service: Gastroenterology;;  . BLADDER SUSPENSION  02/25/2012   Procedure: TRANSVAGINAL TAPE (TVT) PROCEDURE;  Surgeon: Daria Pastures, MD;  Location: [facility];  Service: Gynecology;  Laterality: N/A;  . COLONOSCOPY WITH PROPOFOL N/A 08/29/2019   Procedure: COLONOSCOPY WITH PROPOFOL;  Surgeon: Lavena Bullion, DO;  Location: [facility];  Service: Gastroenterology;  Laterality: N/A;  . CYSTOSCOPY  02/25/2012   Procedure: CYSTOSCOPY;  Surgeon: Daria Pastures, MD;  Location: [facility];  Service: Gynecology;  Laterality: N/A;  . KNEE ARTHROSCOPY  2010  . LAPAROSCOPIC ASSISTED VAGINAL HYSTERECTOMY  06/17/2011   Procedure: LAPAROSCOPIC ASSISTED VAGINAL HYSTERECTOMY;  Surgeon: Arloa Koh;  Location: [facility];  Service: Gynecology;  Laterality: N/A;  Attempted   . POLYPECTOMY  08/29/2019   Procedure: POLYPECTOMY;  Surgeon: Lavena Bullion, DO;  Location: [facility];  Service: Gastroenterology;;  . SALPINGOOPHORECTOMY  06/17/2011   Procedure: SALPINGO OOPHERECTOMY;  Surgeon: Arloa Koh;  Location: [facility];  Service: Gynecology;  Laterality: Bilateral;  . TONSILLECTOMY      Prior to Admission medications   Medication Sig Start Date End Date Taking? Authorizing Provider  Adalimumab (HUMIRA PEN [place]) Inject 40 mg into the skin.    [provider]  dicyclomine (BENTYL) 10 MG capsule Take 1 capsule (10 mg total) by mouth 3 (three) times daily before meals. 05/17/20   [name], MD  esomeprazole (NEXIUM) 40 MG capsule TAKE ONE (1) CAPSULE BY MOUTH 2 TIMES DAILY 03/26/20   Denita Lung, MD  losartan-hydrochlorothiazide (HYZAAR) 50-12.5 MG tablet Take 1 tablet by mouth daily. 03/26/20   Denita Lung, MD  mercaptopurine (PURINETHOL) 50 MG tablet TAKE 2 TABLETS BY MOUTH DAILY. GIVE ON AN  EMPTY STOMACH 1 HOUR BEFORE OR 2 HOURS AFTER MEALS. CAUTION: CHEMOTHERAPY. 05/28/20   [name], MD  ondansetron (ZOFRAN) 4 MG tablet Take 1 tablet (4 mg total) by mouth 3 (three) times daily as needed for nausea or vomiting. 05/16/20   [name], MD  Polyvinyl Alcohol-Povidone (REFRESH OP) Place 1 drop into both eyes daily as needed. Reported on 05/08/2016    [provider]  pramipexole (MIRAPEX) 1 MG tablet Take 1 tablet by mouth daily 03/26/20   Denita Lung, MD  promethazine [facility]) 25  MG tablet Take 1 tablet (25 mg total) by mouth every 6 (six) hours as needed for nausea or vomiting. 12/20/19   Esterwood, Amy S, PA-C  venlafaxine XR (EFFEXOR-XR) 150 MG 24 hr capsule Take 2 capsules by mouth daily 03/26/20   Denita Lung, MD    Current Facility-Administered Medications  Medication Dose Route Frequency Provider Last Rate Last Admin  . morphine 2 MG/ML injection 2 mg  2 mg Intravenous Once Rayna Sexton, PA-C       Current Outpatient Medications  Medication Sig Dispense Refill  . Adalimumab (HUMIRA PEN [place]) Inject 40 mg into the skin.    Marland Kitchen dicyclomine (BENTYL) 10 MG capsule Take 1 capsule (10 mg total) by mouth 3 (three) times daily before meals. 90 capsule 3  . esomeprazole (NEXIUM) 40 MG capsule TAKE ONE (1) CAPSULE BY MOUTH 2 TIMES DAILY 180 capsule 3  . losartan-hydrochlorothiazide (HYZAAR) 50-12.5 MG tablet Take 1 tablet by mouth daily. 90 tablet 3  . mercaptopurine (PURINETHOL) 50 MG tablet TAKE 2 TABLETS BY MOUTH DAILY. GIVE ON AN EMPTY STOMACH 1 HOUR BEFORE OR 2 HOURS AFTER MEALS. CAUTION: CHEMOTHERAPY. 60 tablet 0  . ondansetron (ZOFRAN) 4 MG tablet Take 1 tablet (4 mg total) by mouth 3 (three) times daily as needed for nausea or vomiting. 60 tablet 2  . Polyvinyl Alcohol-Povidone (REFRESH OP) Place 1 drop into both eyes daily as needed. Reported on 05/08/2016    . pramipexole (MIRAPEX) 1 MG tablet Take 1 tablet by mouth daily 90 tablet 3  . promethazine  (PHENERGAN) 25 MG tablet Take 1 tablet (25 mg total) by mouth every 6 (six) hours as needed for nausea or vomiting. 40 tablet 1  . venlafaxine XR (EFFEXOR-XR) 150 MG 24 hr capsule Take 2 capsules by mouth daily 180 capsule 0    Allergies as of 06/27/2020 - Review Complete 06/27/2020  Allergen Reaction Noted  . Iodine  03/17/2008    Family History  Problem Relation Age of Onset  . Diabetes Mother   . Dementia Mother   . Clotting disorder Father   . Macular degeneration Father   . Crohn's disease Sister   . Colon cancer Neg Hx   . Esophageal cancer Neg Hx   . Pancreatic cancer Neg Hx   . Stomach cancer Neg Hx   . Liver disease Neg Hx     Social History   Socioeconomic History  . Marital status: Widowed    Spouse name: Not on file  . Number of children: 1  . Years of education: Not on file  . Highest education level: Not on file  Occupational History  . Occupation: Designer, television/film set: [employer]  Tobacco Use  . Smoking status: Current Every Day Smoker    Packs/day: 0.50    Types: Cigarettes    Last attempt to quit: 12/07/2011    Years since quitting: 8.5  . Smokeless tobacco: Never Used  Vaping Use  . Vaping Use: Never used  Substance and Sexual Activity  . Alcohol use: Yes    Comment: socially; seldom  . Drug use: Yes    Types: Cocaine    Comment: socially; d/c 3 weeks ago per patient 05/31/20  . Sexual activity: Not Currently  Other Topics Concern  . Not on file  Social History Narrative   Lives with boyfriend who she states is an alcoholic   Social Determinants of Radio broadcast assistant Strain:   . Difficulty of Paying Living  Expenses: Not on file  Food Insecurity:   . Worried About Charity fundraiser in the Last Year: Not on file  . Ran Out of Food in the Last Year: Not on file  Transportation Needs:   . Lack of Transportation (Medical): Not on file  . Lack of Transportation (Non-Medical): Not on file  Physical Activity:   . Days of Exercise  per Week: Not on file  . Minutes of Exercise per Session: Not on file  Stress:   . Feeling of Stress : Not on file  Social Connections:   . Frequency of Communication with Friends and Family: Not on file  . Frequency of Social Gatherings with Friends and Family: Not on file  . Attends Religious Services: Not on file  . Active Member of Clubs or Organizations: Not on file  . Attends Archivist Meetings: Not on file  . Marital Status: Not on file  Intimate Partner Violence:   . Fear of Current or Ex-Partner: Not on file  . Emotionally Abused: Not on file  . Physically Abused: Not on file  . Sexually Abused: Not on file    Review of Systems: All systems reviewed and negative except where noted in HPI.  Physical Exam: Vital signs in last 24 hours: Temp:  [98.2 F (36.8 C)] 98.2 F (36.8 C) (08/25 0716) Pulse Rate:  [87-93] 87 (08/25 0915) Resp:  [16] 16 (08/25 0915) BP: (153-156)/(96-107) 153/96 (08/25 0915) SpO2:  [100 %] 100 % (08/25 0915)   General:   Alert, well-developed,  female in NAD Psych:  Pleasant, cooperative. Normal mood and affect. Eyes:  Pupils equal, sclera clear, no icterus.   Conjunctiva pink. Ears:  Normal auditory acuity. Nose:  No deformity, discharge,  or lesions. Neck:  Supple; no masses Lungs:  Clear throughout to auscultation.   No wheezes, crackles, or rhonchi.  Heart:  Regular rate and rhythm; no murmurs, no lower extremity edema Abdomen:  Soft, non-distended, very mild left mid abdominal tenderness.  BS active, no palp mass   Rectal:  Deferred  Msk:  Symmetrical without gross deformities. . Neurologic:  Alert and  oriented x4;  grossly normal neurologically. Skin:  Intact without significant lesions or rashes.   Intake/Output from previous day: No intake/output data recorded. Intake/Output this shift: No intake/output data recorded.  Lab Results: Recent Labs    06/27/20 0727  WBC 12.8*  HGB 15.2*  HCT 45.7  PLT 384    BMET Recent Labs    06/27/20 0727  NA 140  K 2.9*  CL 98  CO2 29  GLUCOSE 149*  BUN 12  CREATININE 0.85  CALCIUM 9.2   LFT Recent Labs    06/27/20 0727  PROT 7.1  ALBUMIN 3.3*  AST 13*  ALT 10  ALKPHOS 61  BILITOT 0.5   PT/INR No results for input(s): LABPROT, INR in the last 72 hours. Hepatitis Panel No results for input(s): HEPBSAG, HCVAB, HEPAIGM, HEPBIGM in the last 72 hours.   . CBC Latest Ref Rng & Units 06/27/2020 05/31/2020 12/05/2019  WBC 4.0 - 10.5 K/uL 12.8(H) 10.3 10.8(H)  Hemoglobin 12.0 - 15.0 g/dL 15.2(H) 14.0 15.1(H)  Hematocrit 36 - 46 % 45.7 41.5 44.6  Platelets 150 - 400 K/uL 384 325.0 393.0    . CMP Latest Ref Rng & Units 06/27/2020 05/31/2020 12/05/2019  Glucose 70 - 99 mg/dL 149(H) 96 103(H)  BUN 6 - 20 mg/dL 12 9 13   Creatinine 0.44 - 1.00 mg/dL 0.85 0.87  0.86  Sodium 135 - 145 mmol/L 140 137 141  Potassium 3.5 - 5.1 mmol/L 2.9(L) 3.2(L) 3.7  Chloride 98 - 111 mmol/L 98 101 104  CO2 22 - 32 mmol/L 29 30 25   Calcium 8.9 - 10.3 mg/dL 9.2 9.5 9.5  Total Protein 6.5 - 8.1 g/dL 7.1 7.6 7.6  Total Bilirubin 0.3 - 1.2 mg/dL 0.5 0.4 0.7  Alkaline Phos 38 - 126 U/L 61 59 68  AST 15 - 41 U/L 13(L) 12 10  ALT 0 - 44 U/L 10 8 9    Studies/Results: DG Abdomen 1 View  Result Date: 06/27/2020 CLINICAL DATA:  Crohn's disease with concern for bowel obstruction EXAM: ABDOMEN - 1 VIEW COMPARISON:  CT abdomen and pelvis June 12, 2020 FINDINGS: There are multiple loops of dilated small bowel without appreciable air-fluid level. No free air appreciable on supine examination. A small amount of air is noted scattered throughout the colon and rectum. Visualized lung bases are clear. IMPRESSION: Loops of dilated small bowel in a pattern concerning for a degree of bowel obstruction. No free air evident. Electronically Signed   By: Lowella Grip III M.D.   On: 06/27/2020 08:27    Principal Problem:   Crohn's ileitis, with intestinal obstruction ([place]) Active  Problems:   GERD   Hypertension   Obesity (BMI 30-39.9)   Crohn disease ([place])    Tye Savoy, NP-C @  06/27/2020, 11:11 AM  I have reviewed the entire case in detail with the above APP and discussed the plan in detail.  Therefore, I agree with the diagnoses recorded above. In addition,  I have personally interviewed and examined the patient and have personally reviewed any abdominal/pelvic CT scan images. (Both the October 2020 scan and the August 11 scan)  My additional thoughts are as follows:  Cynthia [name] has severe fistulizing ileal Crohn's disease that has steadily worsened in the last couple of months despite Humira monotherapy which appears to have started in May..  It sounds like she has never felt well on therapy since her original diagnosis last year.  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  That enterocolonic fistula may have been present upon diagnosis last year even though not noted on CT scan, probably accounting for left colon findings on Dr. Vivia Ewing colonoscopy when the diagnosis was made.  Cynthia [name] has steadily worsened in the last couple of weeks with pain and vomiting and most likely has progression of this bowel obstruction.  She has areas of mild tenderness on exam, is not distended or tympanic, but looks acutely and chronically unwell, is hypokalemic and can keep down virtually nothing by mouth.  She will be seen in surgical consultation and we recommended a repeat CT scan.  I want to see if there is any radiographic progression of the obstruction, and also if there is any further complication such as microperforation or abscess that may require antibiotics and/or drainage.  If findings are similar or worse, then she also needs an NG tube placement.  She is not happy to hear that, but I tried to convince her  it would ultimately improve her symptoms and decrease her risks of bowel perforation and further complications.  We have not started prednisone pending results of CT scan, and I fear that her condition may have gone past the point of medical management.  She will also need IV fluids,  aggressive replacement of potassium and other electrolytes as needed.  We will follow her closely and appreciate the efforts of her entire care team.   Nelida Meuse III Office:[phone]

## 2020-06-28 ENCOUNTER — Inpatient Hospital Stay (HOSPITAL_COMMUNITY): Payer: 59

## 2020-06-28 ENCOUNTER — Inpatient Hospital Stay: Payer: Self-pay

## 2020-06-28 ENCOUNTER — Ambulatory Visit: Payer: Self-pay | Admitting: Gastroenterology

## 2020-06-28 LAB — COMPREHENSIVE METABOLIC PANEL
ALT: 10 U/L (ref 0–44)
AST: 18 U/L (ref 15–41)
Albumin: 2.8 g/dL — ABNORMAL LOW (ref 3.5–5.0)
Alkaline Phosphatase: 57 U/L (ref 38–126)
Anion gap: 8 (ref 5–15)
BUN: 12 mg/dL (ref 6–20)
CO2: 26 mmol/L (ref 22–32)
Calcium: 8.2 mg/dL — ABNORMAL LOW (ref 8.9–10.3)
Chloride: 105 mmol/L (ref 98–111)
Creatinine, Ser: 0.78 mg/dL (ref 0.44–1.00)
GFR calc Af Amer: >60 mL/min (ref >60)
GFR calc non Af Amer: >60 mL/min (ref >60)
Glucose, Bld: 87 mg/dL (ref 70–99)
Potassium: 4.3 mmol/L (ref 3.5–5.1)
Sodium: 139 mmol/L (ref 135–145)
Total Bilirubin: 0.9 mg/dL (ref 0.3–1.2)
Total Protein: 6.3 g/dL — ABNORMAL LOW (ref 6.5–8.1)

## 2020-06-28 LAB — CBC
HCT: 42.1 % (ref 36.0–46.0)
Hemoglobin: 13.3 g/dL (ref 12.0–15.0)
MCH: 29.3 pg (ref 26.0–34.0)
MCHC: 31.6 g/dL (ref 30.0–36.0)
MCV: 92.7 fL (ref 80.0–100.0)
Platelets: 318 10*3/uL (ref 150–400)
RBC: 4.54 MIL/uL (ref 3.87–5.11)
RDW: 14.2 % (ref 11.5–15.5)
WBC: 11.3 10*3/uL — ABNORMAL HIGH (ref 4.0–10.5)
nRBC: 0 % (ref 0.0–0.2)

## 2020-06-28 MED ORDER — PANTOPRAZOLE SODIUM 40 MG IV SOLR
40.0000 mg | Freq: Two times a day (BID) | INTRAVENOUS | Status: DC
  Administered 2020-06-28 – 2020-07-01 (×7): 40 mg via INTRAVENOUS
  Filled 2020-06-28 (×7): qty 40

## 2020-06-28 MED ORDER — CHLORHEXIDINE GLUCONATE CLOTH 2 % EX PADS
6.0000 | MEDICATED_PAD | Freq: Every day | CUTANEOUS | Status: DC
  Administered 2020-06-28 – 2020-07-03 (×5): 6 via TOPICAL

## 2020-06-28 MED ORDER — SODIUM CHLORIDE 0.9% FLUSH
10.0000 mL | Freq: Two times a day (BID) | INTRAVENOUS | Status: DC
  Administered 2020-06-29 – 2020-07-03 (×3): 10 mL

## 2020-06-28 MED ORDER — SODIUM CHLORIDE 0.9% FLUSH: 10.0000 mL | INTRAVENOUS | Status: DC | PRN

## 2020-06-28 MED ORDER — ENALAPRILAT 1.25 MG/ML IV SOLN
0.6250 mg | Freq: Three times a day (TID) | INTRAVENOUS | Status: DC
  Administered 2020-06-28 – 2020-06-29 (×4): 0.625 mg via INTRAVENOUS
  Filled 2020-06-28 (×5): qty 0.5

## 2020-06-28 MED ORDER — METHYLPREDNISOLONE SODIUM SUCC 125 MG IJ SOLR
60.0000 mg | Freq: Three times a day (TID) | INTRAMUSCULAR | Status: DC
  Administered 2020-06-28 – 2020-06-29 (×2): 60 mg via INTRAVENOUS
  Filled 2020-06-28 (×2): qty 2

## 2020-06-28 NOTE — Progress Notes (Signed)
Initial Nutrition Assessment  INTERVENTION:   -TPN management per Pharmacy -Will continue to monitor for diet advancement  NUTRITION DIAGNOSIS:   Increased nutrient needs related to  (Crohn's ileitis) as evidenced by estimated needs.  GOAL:   Patient will meet greater than or equal to 90% of their needs  MONITOR:   Diet advancement, Labs, Weight trends, I & O's (TPN)  REASON FOR ASSESSMENT:   Consult New TPN/TNA  ASSESSMENT:   59 y.o. female with medical history significant of Crohn's disease, depression, GERD who presents to the emergency department with complaints of progressively worsening abdominal discomfort.  Patient has Crohn's disease and is followed closely by gastroenterology.  Patient did report some diarrhea 1 day prior to hospital admission  Patient in room, sitting up but visibly shaking. Pt is upset about possibly needing surgery. Pt currently NPO. PICC to be placed and TPN to be initiated.  Pt states she has not been eating well. Was having diarrhea and having pain where she could not eat anything but drink liquids at times.   Pt states she used to weigh ~300 lbs.  Per weight records, pt has lost 20 lbs since 4/21 (10% wt loss x 4 months, significant for time frame). Current weight: 165 lbs.  Labs reviewed. Medications: IV Zofran  NUTRITION - FOCUSED PHYSICAL EXAM:  Patient upset, did not complete at this time.  Diet Order:   Diet Order            Diet NPO time specified Except for: Sips with Meds, Ice Chips  Diet effective now                 EDUCATION NEEDS:   No education needs have been identified at this time  Skin:  Skin Assessment: Reviewed RN Assessment  Last BM:  8/26  Height:   Ht Readings from Last 1 Encounters:  06/27/20 5' 3"  (1.6 m)    Weight:   Wt Readings from Last 1 Encounters:  06/27/20 74.8 kg    BMI:  Body mass index is 29.23 kg/m.  Estimated Nutritional Needs:   Kcal:  1800-2000  Protein:   70-85g  Fluid:  2L/day  Clayton Bibles, MS, RD, LDN Inpatient Clinical Dietitian Contact information available via Amion

## 2020-06-28 NOTE — Progress Notes (Signed)
Peripherally Inserted Central Catheter Placement  The IV Nurse has discussed with the patient and/or persons authorized to consent for the patient, the purpose of this procedure and the potential benefits and risks involved with this procedure.  The benefits include less needle sticks, lab draws from the catheter, and the patient may be discharged home with the catheter. Risks include, but not limited to, infection, bleeding, blood clot (thrombus formation), and puncture of an artery; nerve damage and irregular heartbeat and possibility to perform a PICC exchange if needed/ordered by physician.  Alternatives to this procedure were also discussed.  Bard Power PICC patient education guide, fact sheet on infection prevention and patient information card has been provided to patient /or left at bedside.    PICC Placement Documentation  PICC Double Lumen 12/75/17 PICC Right Basilic 37 cm 0 cm (Active)  Indication for Insertion or Continuance of Line Administration of hyperosmolar/irritating solutions (i.e. TPN, Vancomycin, etc.) 06/28/20 1444  Exposed Catheter (cm) 0 cm 06/28/20 1444  Site Assessment Clean;Dry;Intact 06/28/20 1444  Lumen #1 Status Flushed;Blood return noted;Saline locked 06/28/20 1444  Lumen #2 Status Blood return noted;Flushed;Saline locked 06/28/20 1444  Dressing Type Transparent;Securing device 06/28/20 1444  Dressing Status Clean;Dry;Intact;Antimicrobial disc in place 06/28/20 1444  Dressing Change Due 07/05/20 06/28/20 1444       Cynthia [name] 06/28/2020, 2:47 PM

## 2020-06-28 NOTE — Progress Notes (Signed)
Spoke to Dr. Rosendo Gros earlier today about case.  Went back to speak with Judeen Hammans about it this evening.  I've decided to start solumedrol in hopes it may help open up the SBO in the next few days. If so, it might at least get her well enough to leave the hospital and discuss elective surgery with [facility].

## 2020-06-28 NOTE — Progress Notes (Signed)
[facility] Progress Note     Subjective: Patient denies abdominal pain this AM. Had diarrhea overnight, white in color. Patient hoping to avoid to surgery.   Objective: Vital signs in last 24 hours: Temp:  [97.7 F (36.5 C)-98.5 F (36.9 C)] 97.7 F (36.5 C) (08/26 0542) Pulse Rate:  [77-87] 77 (08/26 0542) Resp:  [16-24] 16 (08/26 0542) BP: (142-170)/(85-122) 170/95 (08/26 0542) SpO2:  [96 %-100 %] 98 % (08/26 0542) Weight:  [74.8 kg] 74.8 kg (08/25 2100) Last BM Date: 06/27/20  Intake/Output from previous day: 08/25 0701 - 08/26 0700 In: 714.1 [I.V.:714.1] Out: 750 [Emesis/NG output:750] Intake/Output this shift: No intake/output data recorded.  PE: General: pleasant, WD, WN white female who is laying in bed in NAD HEENT: head is normocephalic, atraumatic.  Sclera are noninjected.  PERRL.  Ears and nose without any masses or lesions.  Mouth is pink and moist Heart: regular, rate, and rhythm.  Normal s1,s2. No obvious murmurs, gallops, or rubs noted.  Palpable radial and pedal pulses bilaterally Lungs: CTAB, no wheezes, rhonchi, or rales noted.  Respiratory effort nonlabored Abd: soft, NT, ND, +BS, no masses, hernias, or organomegaly MS: all 4 extremities are symmetrical with no cyanosis, clubbing, or edema. Skin: warm and dry with no masses, lesions, or rashes Neuro: Cranial nerves 2-12 grossly intact, sensation grossly intact throughout Psych: A&Ox3 with an appropriate affect.    Lab Results:  Recent Labs    06/27/20 0727 06/28/20 0512  WBC 12.8* 11.3*  HGB 15.2* 13.3  HCT 45.7 42.1  PLT 384 318   BMET Recent Labs    06/27/20 0727 06/28/20 0512  NA 140 139  K 2.9* 4.3  CL 98 105  CO2 29 26  GLUCOSE 149* 87  BUN 12 12  CREATININE 0.85 0.78  CALCIUM 9.2 8.2*   PT/INR No results for input(s): LABPROT, INR in the last 72 hours. CMP     Component Value Date/Time   NA 139 06/28/2020 0512   K 4.3 06/28/2020 0512   CL 105 06/28/2020 0512    CO2 26 06/28/2020 0512   GLUCOSE 87 06/28/2020 0512   BUN 12 06/28/2020 0512   CREATININE 0.78 06/28/2020 0512   CREATININE 0.94 05/08/2016 0741   CALCIUM 8.2 (L) 06/28/2020 0512   PROT 6.3 (L) 06/28/2020 0512   ALBUMIN 2.8 (L) 06/28/2020 0512   AST 18 06/28/2020 0512   ALT 10 06/28/2020 0512   ALKPHOS 57 06/28/2020 0512   BILITOT 0.9 06/28/2020 0512   GFRNONAA >60 06/28/2020 0512   GFRAA >60 06/28/2020 0512   Lipase     Component Value Date/Time   LIPASE 22 06/27/2020 0727       Studies/Results: DG Abdomen 1 View  Result Date: 06/27/2020 CLINICAL DATA:  Crohn's disease with concern for bowel obstruction EXAM: ABDOMEN - 1 VIEW COMPARISON:  CT abdomen and pelvis June 12, 2020 FINDINGS: There are multiple loops of dilated small bowel without appreciable air-fluid level. No free air appreciable on supine examination. A small amount of air is noted scattered throughout the colon and rectum. Visualized lung bases are clear. IMPRESSION: Loops of dilated small bowel in a pattern concerning for a degree of bowel obstruction. No free air evident. Electronically Signed   By: Lowella Grip III M.D.   On: 06/27/2020 08:27   CT ABDOMEN PELVIS W CONTRAST  Result Date: 06/27/2020 CLINICAL DATA:  Bowel obstruction is suspected EXAM: CT ABDOMEN AND PELVIS WITH CONTRAST TECHNIQUE: Multidetector CT imaging of the  abdomen and pelvis was performed using the standard protocol following bolus administration of intravenous contrast. CONTRAST:  152m OMNIPAQUE IOHEXOL 300 MG/ML  SOLN COMPARISON:  June 12, 2020 FINDINGS: Lower chest: Incidental imaging of the lung bases is unremarkable. No consolidation. No pleural effusion. Hepatobiliary: Gallbladder is distended with sludge and or partially radiopaque stones in the lumen. No pericholecystic stranding. No biliary duct dilation. No focal, suspicious hepatic lesion. Pancreas: Pancreas is normal without focal lesion or ductal dilation. Spleen: Spleen  normal in size and contour. Adrenals/Urinary Tract: Adrenal glands are normal. Kidneys enhance symmetrically. No hydronephrosis. Urinary bladder is unremarkable aside from stranding about the bladder in the setting of complex fistula in the pelvis. Limited assessment limiting assessment of the urinary bladder. No bladder wall thickening. Stomach/Bowel: Marked bowel dilation of small bowel in the setting of known Crohn's disease. Bowel is increasingly distended as compared to the previous study approximately 4.7 cm just above the level of mural stratification and narrowing in the distal ileum that is associated with and intro enteric as well as potentially enterocolonic fistula in this location. Colonic thickening is slightly improved perhaps compared to the prior study but there is still extensive inflammation in the pelvis signs of perienteric stranding. Mid small bowel loops are also increasingly dilated to approximately 4.8 cm. Areas in the mid small bowel previously approximately 4.4 cm. Vascular/Lymphatic: No aneurysmal dilation of the abdominal aorta. No adenopathy in the retroperitoneum. No pelvic lymphadenopathy. Reproductive: Post hysterectomy. Other: No free air. Musculoskeletal: No acute bone finding. No destructive bone process. IMPRESSION: 1. 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 Worsening partial small bowel obstruction associated with above findings. 2. Persistent signs of surrounding inflammation with similar appearance. 3. No signs of abscess. 4. Gallbladder distension without pericholecystic stranding or wall thickening. Correlate with any RIGHT upper quadrant symptoms or laboratory abnormalities as warranted. 5. Aortic atherosclerosis. Aortic Atherosclerosis (ICD10-I70.0). Electronically Signed   By: GZetta BillsM.D.   On: 06/27/2020 16:25   DG Abd Portable  1V  Result Date: 06/28/2020 CLINICAL DATA:  Small bowel obstruction. EXAM: PORTABLE ABDOMEN - 1 VIEW COMPARISON:  June 27, 2020. FINDINGS: Dilated small bowel loops are noted concerning for distal small bowel obstruction. No colonic dilatation is noted. Residual contrast is noted in the colon. Distal tip of nasogastric tube is seen in the expected position of the stomach. IMPRESSION: Dilated small bowel loops are noted concerning for distal small bowel obstruction. Electronically Signed   By: JMarijo ConceptionM.D.   On: 06/28/2020 08:12   DG Abd Portable 1V  Result Date: 06/27/2020 CLINICAL DATA:  NG tube placement EXAM: PORTABLE ABDOMEN - 1 VIEW COMPARISON:  06/27/2020 FINDINGS: Esophageal tube tip overlies the gastric fundus. Contrast within the renal collecting systems. Dilated small bowel in the upper abdomen. IMPRESSION: Esophageal tube tip overlies the gastric fundus. Electronically Signed   By: KDonavan FoilM.D.   On: 06/27/2020 20:47    Anti-infectives: Anti-infectives (From admission, onward)   None       Assessment/Plan HTN GERD RLS Depression/Anxiety  Crohn's ileitis with SBO and fistulas - outpatient CT showed 8/11 showed high-grade sbo, E-E fistulas and fistulous connection to sigmoid colon - CT yesterday showed increased chronic distention and worsening psbo related to fistulas - KUB this AM still shows distal SBO - NGT placed yesterday and patient has had 750 cc out, patient also having bowel function -  GI wrote yesterday that patient is past medical management of Crohn's and she will need surgery, we will try to touch base with them today and discuss - no emergent surgical intervention at this time, but we will continue to follow closely    FEN: ice chips, NGT to LIWS, IVF VTE: lovenox  ID: no current abx  LOS: 1 day    Norm Parcel , [facility] 06/28/2020, 8:49 AM Please see Amion for pager number during day hours 7:00am-4:30pm

## 2020-06-28 NOTE — Plan of Care (Signed)

## 2020-06-28 NOTE — Progress Notes (Signed)
[facility] Progress Note  Chief Complaint: Ileal Crohn's disease with high-grade small bowel obstruction  History:  Cynthia [name] had an NG tube placed overnight and it has greatly decreased abdominal pain and stopped her vomiting.  She has had about 800 cc of output so far.  She passed liquid stool overnight. 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 ROS: Cardiovascular: Denies chest pain Respiratory: Denies dyspnea Urinary: Denies dysuria  Objective:   Current Facility-Administered Medications:  .  0.9 %  sodium chloride infusion, , Intravenous, Continuous, Donne Hazel, MD, Last Rate: 75 mL/hr at 06/28/20 0918, New Bag at 06/28/20 0918 .  0.9 %  sodium chloride infusion, , Intravenous, Continuous, Willia Craze, NP .  acetaminophen (TYLENOL) tablet 650 mg, 650 mg, Oral, Q6H PRN **OR** acetaminophen (TYLENOL) suppository 650 mg, 650 mg, Rectal, Q6H PRN, Donne Hazel, MD .  enalaprilat (VASOTEC) injection 0.625 mg, 0.625 mg, Intravenous, Q8H, Kc, Ramesh, MD, 0.625 mg at 06/28/20 1007 .  enoxaparin (LOVENOX) injection 40 mg, 40 mg, Subcutaneous, Q24H, Donne Hazel, MD, 40 mg at 06/27/20 1716 .  morphine 2 MG/ML injection 2 mg, 2 mg, Intravenous, Q2H PRN, Donne Hazel, MD, 2 mg at 06/28/20 0620 .  ondansetron (ZOFRAN) injection 4 mg, 4 mg, Intravenous, Q6H PRN, Donne Hazel, MD, 4 mg at 06/28/20 0403 .  pantoprazole (PROTONIX) injection 40 mg, 40 mg, Intravenous, Q12H, Norm Parcel, PA-C, 40 mg at 06/28/20 1001 .  phenol (CHLORASEPTIC) mouth spray 1 spray, 1 spray, Mouth/Throat, PRN, Donne Hazel, MD, 1 spray at 06/27/20 2130  . sodium chloride 75 mL/hr at 06/28/20 0918  . sodium chloride       Vital signs in last 24 hrs: Vitals:   06/28/20 0156 06/28/20 0542  BP: (!) 148/91 (!) 170/95  Pulse: 77 77  Resp: 18 16  Temp: 98.5 F (36.9 C) 97.7 F (36.5 C)  SpO2: 97%  98%    Intake/Output Summary (Last 24 hours) at 06/28/2020 1022 Last data filed at 06/28/2020 0600 Gross per 24 hour  Intake 714.08 ml  Output 750 ml  Net -35.92 ml     Physical Exam Nontoxic.  Uncomfortable with NG tube in right nostril, pleasant and conversational, anxious and tearful at times during the visit.  HEENT: sclera anicteric, oral mucosa without lesions  Neck: supple, no thyromegaly, JVD or lymphadenopathy  Cardiac: RRR without murmurs, S1S2 heard, no peripheral edema  Pulm: clear to auscultation bilaterally, normal RR and effort noted  Abdomen: soft, minimal tenderness, less distended with active bowel sounds. No guarding or palpable hepatosplenomegaly  Skin; warm and dry, no jaundice  Recent Labs:  CBC Latest Ref Rng & Units 06/28/2020 06/27/2020 05/31/2020  WBC 4.0 - 10.5 K/uL 11.3(H) 12.8(H) 10.3  Hemoglobin 12.0 - 15.0 g/dL 13.3 15.2(H) 14.0  Hematocrit 36 - 46 % 42.1 45.7 41.5  Platelets 150 - 400 K/uL 318 384 325.0    No results for input(s): INR in the last 168 hours. CMP Latest Ref Rng & Units 06/28/2020 06/27/2020 05/31/2020  Glucose 70 - 99 mg/dL 87 149(H) 96  BUN 6 - 20 mg/dL 12 12 9   Creatinine 0.44 - 1.00 mg/dL 0.78 0.85 0.87  Sodium 135 - 145 mmol/L 139 140 137  Potassium 3.5 - 5.1 mmol/L 4.3 2.9(L) 3.2(L)  Chloride 98 - 111 mmol/L 105 98 101  CO2 22 - 32 mmol/L 26  29 30  Calcium 8.9 - 10.3 mg/dL 8.2(L) 9.2 9.5  Total Protein 6.5 - 8.1 g/dL 6.3(L) 7.1 7.6  Total Bilirubin 0.3 - 1.2 mg/dL 0.9 0.5 0.4  Alkaline Phos 38 - 126 U/L 57 61 59  AST 15 - 41 U/L 18 13(L) 12  ALT 0 - 44 U/L 10 10 8      Radiologic studies: CLINICAL DATA:  Bowel obstruction is suspected   EXAM: CT ABDOMEN AND PELVIS WITH CONTRAST   TECHNIQUE: Multidetector CT imaging of the abdomen and pelvis was performed using the standard protocol following bolus administration of intravenous contrast.   CONTRAST:  144m OMNIPAQUE IOHEXOL 300 MG/ML  SOLN   COMPARISON:   June 12, 2020   FINDINGS: Lower chest: Incidental imaging of the lung bases is unremarkable. No consolidation. No pleural effusion.   Hepatobiliary: Gallbladder is distended with sludge and or partially radiopaque stones in the lumen. No pericholecystic stranding. No biliary duct dilation. No focal, suspicious hepatic lesion.   Pancreas: Pancreas is normal without focal lesion or ductal dilation.   Spleen: Spleen normal in size and contour.   Adrenals/Urinary Tract: Adrenal glands are normal.   Kidneys enhance symmetrically. No hydronephrosis. Urinary bladder is unremarkable aside from stranding about the bladder in the setting of complex fistula in the pelvis. Limited assessment limiting assessment of the urinary bladder. No bladder wall thickening.   Stomach/Bowel: Marked bowel dilation of small bowel in the setting of known Crohn's disease. Bowel is increasingly distended as compared to the previous study approximately 4.7 cm just above the level of mural stratification and narrowing in the distal ileum that is associated with and intro enteric as well as potentially enterocolonic fistula in this location.   Colonic thickening is slightly improved perhaps compared to the prior study but there is still extensive inflammation in the pelvis signs of perienteric stranding.   Mid small bowel loops are also increasingly dilated to approximately 4.8 cm. Areas in the mid small bowel previously approximately 4.4 cm.   Vascular/Lymphatic: No aneurysmal dilation of the abdominal aorta. No adenopathy in the retroperitoneum.   No pelvic lymphadenopathy.   Reproductive: Post hysterectomy.   Other: No free air.   Musculoskeletal: No acute bone finding. No destructive bone process.   IMPRESSION: 1. 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 Worsening  partial small bowel obstruction associated with above findings. 2. Persistent signs of surrounding inflammation with similar appearance. 3. No signs of abscess. 4. Gallbladder distension without pericholecystic stranding or wall thickening. Correlate with any RIGHT upper quadrant symptoms or laboratory abnormalities as warranted. 5. Aortic atherosclerosis.   Aortic Atherosclerosis (ICD10-I70.0).     Electronically Signed   By: GZetta BillsM.D.   On: 06/27/2020 16:25   Assessment & Plan  Assessment: Ileal Crohn's disease with high-grade small bowel obstruction as well as enteroenteric and enterocolonic fistulae.  She most likely has severe multifocal fibrotic disease as well. Generalized abdominal pain, nausea and vomiting improved after NG tube decompression.  Her overall clinical picture leads me to the opinion that her Crohn's disease is past medical management at this point and needs surgical intervention, though certainly not urgently.  She needs more time of stable NG tube decompression and institution of TPN for protein calorie malnutrition.  Then she could have optimal immune function and better tissue healing from surgery.  I will discuss with our surgical colleagues and also with  her primary gastroenterologist Dr. Fuller Plan for other opinions and a team approach to the care plan. I have not started steroids at this point because I think it is unlikely to turn this disease around given his current status and would also increase the risk of poor tissue healing from surgery.  Plan: Please make arrangements for PICC line placement and TPN nutrition consult. Continue NG tube drainage and provide sufficient IV fluid support to replace losses. Monitor renal function daily and electrolytes daily. Further recommendations and care plan to follow.  Total time 35 minutes.  Nelida Meuse III Office: [phone]

## 2020-06-28 NOTE — Progress Notes (Signed)
PROGRESS NOTE    Cynthia [name]  WIO:[id number] DOB: 27-Jun-1961 DOA: 06/27/2020 PCP: Denita Lung, MD   Chief Complaint  Patient presents with  . Abdominal Pain    Brief Narrative:  59 y.o. female with medical history significant of Crohn's disease, depression, GERD who presents to the emergency department with complaints of progressively worsening abdominal discomfort.  Patient has Crohn's disease and is followed closely by gastroenterology.  Patient did report some diarrhea 1 day prior to hospital admission  ED Course: In the emergency department, patient underwent abdominal x-ray with findings consistent with bowel obstruction.  Given progressive Crohn's disease with obstruction, gastroenterology and general surgery were consulted.  Hospitalist consulted for consideration for medical admission  Subjective: Seen this morning.  NG tube on intermittent suction.  Patient reports she feels much improved today no nausea vomiting abdominal pain.  Passing flatus. Afebrile overnight.  Blood pressure borderline controlled 140s to 170s, on room air. CBC CMP stable. NG output 750 ml  Assessment & Plan:  Crohn's ileitis, with intestinal obstruction SBO and fistulas seen in the outpatient CT: Repeat CT in the ED with increasing chronic distention of the small bowel in the setting of fistula and stricture of the distal ileum, enteroenteric and enterocolonic fistula edition present, but worsening small bowel obstruction and persistent signs of surrounding inflammation with similar appearance no signs of abscess, GB distended without pericholecystic stranding or wall thickening.  Surgery and GI on board.  Continue current conservative management, NG tube decompression IV fluids supportive care, and she seems to be responding.  May need surgical evaluation per GI at some point and has requested PICC line placement and TPN nutrition consult  And will order them.   GERD: Continue on  PPI  Hypertension: On losartan/HCTZ at home.  BP poorly controlled.  Add vasotec 6.25 mg Q8H w/ holding parameters.   Hypokalemia -resolved  DVT prophylaxis: enoxaparin (LOVENOX) injection 40 mg Start: 06/27/20 1800 Code Status:   Code Status: Full Code  Family Communication: plan of care discussed with patient at bedside.  Status is: Inpatient  Remains inpatient appropriate because:IV treatments appropriate due to intensity of illness or inability to take PO and Inpatient level of care appropriate due to severity of illness  Dispo: The patient is from: Home              Anticipated d/c is to: Home              Anticipated d/c date is: 3 days              Patient currently is not medically stable to d/c.   Diet Order            Diet NPO time specified Except for: Sips with Meds, Ice Chips  Diet effective now                 Body mass index is 29.23 kg/m.  Consultants:see note  Procedures:see note Microbiology:see note Blood Culture    Component Value Date/Time   SDES URINE, RANDOM 11/14/2015 1642   SPECREQUEST NONE 11/14/2015 1642   CULT MULTIPLE SPECIES PRESENT, SUGGEST RECOLLECTION 11/14/2015 1642   REPTSTATUS 11/16/2015 FINAL 11/14/2015 1642    Other culture-see note  Medications: Scheduled Meds: . enalaprilat  0.625 mg Intravenous Q8H  . enoxaparin (LOVENOX) injection  40 mg Subcutaneous Q24H  . pantoprazole (PROTONIX) IV  40 mg Intravenous Q12H   Continuous Infusions: . sodium chloride 75 mL/hr at 06/28/20 0918  . sodium  chloride      Antimicrobials: Anti-infectives (From admission, onward)   None       Objective: Vitals: Today's Vitals   06/28/20 0403 06/28/20 0433 06/28/20 0542 06/28/20 0620  BP:   (!) 170/95   Pulse:   77   Resp:   16   Temp:   97.7 F (36.5 C)   TempSrc:   Oral   SpO2:   98%   Weight:      Height:      PainSc: 7  Asleep  8     Intake/Output Summary (Last 24 hours) at 06/28/2020 1033 Last data filed at 06/28/2020  0600 Gross per 24 hour  Intake 714.08 ml  Output 750 ml  Net -35.92 ml   Filed Weights   06/27/20 2100  Weight: 74.8 kg   Weight change:    Intake/Output from previous day: 08/25 0701 - 08/26 0700 In: 714.1 [I.V.:714.1] Out: 750 [Emesis/NG output:750] Intake/Output this shift: No intake/output data recorded.  Examination:  General exam: AAOx3 ,NAD, weak appearing. HEENT:Oral mucosa moist, Ear/Nose WNL grossly,dentition normal. Respiratory system: bilaterally clear,no wheezing or crackles,no use of accessory muscle, non tender. Cardiovascular system: S1 & S2 +, regular, No JVD. Gastrointestinal system: Abdomen soft, NGT+, minimal tenderness present, mild distention, BS+. Nervous System:Alert, awake, moving extremities and grossly nonfocal Extremities: No edema, distal peripheral pulses palpable.  Skin: No rashes,no icterus. MSK: Normal muscle bulk,tone, power  Data Reviewed: I have personally reviewed following labs and imaging studies CBC: Recent Labs  Lab 06/27/20 0727 06/28/20 0512  WBC 12.8* 11.3*  HGB 15.2* 13.3  HCT 45.7 42.1  MCV 86.1 92.7  PLT 384 656   Basic Metabolic Panel: Recent Labs  Lab 06/27/20 0727 06/28/20 0512  NA 140 139  K 2.9* 4.3  CL 98 105  CO2 29 26  GLUCOSE 149* 87  BUN 12 12  CREATININE 0.85 0.78  CALCIUM 9.2 8.2*  MG 1.9  --    GFR: Estimated Creatinine Clearance: 72.5 mL/min (by C-G formula based on SCr of 0.78 mg/dL). Liver Function Tests: Recent Labs  Lab 06/27/20 0727 06/28/20 0512  AST 13* 18  ALT 10 10  ALKPHOS 61 57  BILITOT 0.5 0.9  PROT 7.1 6.3*  ALBUMIN 3.3* 2.8*   Recent Labs  Lab 06/27/20 0727  LIPASE 22   No results for input(s): AMMONIA in the last 168 hours. Coagulation Profile: No results for input(s): INR, PROTIME in the last 168 hours. Cardiac Enzymes: No results for input(s): CKTOTAL, CKMB, CKMBINDEX, TROPONINI in the last 168 hours. BNP (last 3 results) No results for input(s): PROBNP in  the last 8760 hours. HbA1C: No results for input(s): HGBA1C in the last 72 hours. CBG: No results for input(s): GLUCAP in the last 168 hours. Lipid Profile: No results for input(s): CHOL, HDL, LDLCALC, TRIG, CHOLHDL, LDLDIRECT in the last 72 hours. Thyroid Function Tests: No results for input(s): TSH, T4TOTAL, FREET4, T3FREE, THYROIDAB in the last 72 hours. Anemia Panel: No results for input(s): VITAMINB12, FOLATE, FERRITIN, TIBC, IRON, RETICCTPCT in the last 72 hours. Sepsis Labs: No results for input(s): PROCALCITON, LATICACIDVEN in the last 168 hours.  Recent Results (from the past 240 hour(s))  SARS Coronavirus 2 by RT PCR (hospital order, performed in [facility] hospital lab) Nasopharyngeal Nasopharyngeal Swab     Status: None   Collection Time: 06/27/20 10:56 AM   Specimen: Nasopharyngeal Swab  Result Value Ref Range Status   SARS Coronavirus 2 NEGATIVE NEGATIVE Final  Comment: (NOTE) SARS-CoV-2 target nucleic acids are NOT DETECTED.  The SARS-CoV-2 RNA is generally detectable in upper and lower respiratory specimens during the acute phase of infection. The lowest concentration of SARS-CoV-2 viral copies this assay can detect is 250 copies / mL. A negative result does not preclude SARS-CoV-2 infection and should not be used as the sole basis for treatment or other patient management decisions.  A negative result may occur with improper specimen collection / handling, submission of specimen other than nasopharyngeal swab, presence of viral mutation(s) within the areas targeted by this assay, and inadequate number of viral copies (<250 copies / mL). A negative result must be combined with clinical observations, patient history, and epidemiological information.  Fact Sheet for Patients:   StrictlyIdeas.no  Fact Sheet for Healthcare Providers: BankingDealers.co.za  This test is not yet approved or  cleared by the [place] FDA and has been authorized for detection and/or diagnosis of SARS-CoV-2 by FDA under an Emergency Use Authorization (EUA).  This EUA will remain in effect (meaning this test can be used) for the duration of the COVID-19 declaration under Section 564(b)(1) of the Act, 21 U.S.C. section 360bbb-3(b)(1), unless the authorization is terminated or revoked sooner.  Performed at [facility], [place] [address], [place], [place] [address]       Radiology Studies: DG Abdomen 1 View  Result Date: 06/27/2020 CLINICAL DATA:  Crohn's disease with concern for bowel obstruction EXAM: ABDOMEN - 1 VIEW COMPARISON:  CT abdomen and pelvis June 12, 2020 FINDINGS: There are multiple loops of dilated small bowel without appreciable air-fluid level. No free air appreciable on supine examination. A small amount of air is noted scattered throughout the colon and rectum. Visualized lung bases are clear. IMPRESSION: Loops of dilated small bowel in a pattern concerning for a degree of bowel obstruction. No free air evident. Electronically Signed   By: Lowella Grip III M.D.   On: 06/27/2020 08:27   CT ABDOMEN PELVIS W CONTRAST  Result Date: 06/27/2020 CLINICAL DATA:  Bowel obstruction is suspected EXAM: CT ABDOMEN AND PELVIS WITH CONTRAST TECHNIQUE: Multidetector CT imaging of the abdomen and pelvis was performed using the standard protocol following bolus administration of intravenous contrast. CONTRAST:  155m OMNIPAQUE IOHEXOL 300 MG/ML  SOLN COMPARISON:  June 12, 2020 FINDINGS: Lower chest: Incidental imaging of the lung bases is unremarkable. No consolidation. No pleural effusion. Hepatobiliary: Gallbladder is distended with sludge and or partially radiopaque stones in the lumen. No pericholecystic stranding. No biliary duct dilation. No focal, suspicious hepatic lesion. Pancreas: Pancreas is normal without focal lesion or ductal dilation. Spleen: Spleen normal in size and contour.  Adrenals/Urinary Tract: Adrenal glands are normal. Kidneys enhance symmetrically. No hydronephrosis. Urinary bladder is unremarkable aside from stranding about the bladder in the setting of complex fistula in the pelvis. Limited assessment limiting assessment of the urinary bladder. No bladder wall thickening. Stomach/Bowel: Marked bowel dilation of small bowel in the setting of known Crohn's disease. Bowel is increasingly distended as compared to the previous study approximately 4.7 cm just above the level of mural stratification and narrowing in the distal ileum that is associated with and intro enteric as well as potentially enterocolonic fistula in this location. Colonic thickening is slightly improved perhaps compared to the prior study but there is still extensive inflammation in the pelvis signs of perienteric stranding. Mid small bowel loops are also increasingly dilated to approximately 4.8 cm. Areas in the mid small bowel previously approximately 4.4 cm.  Vascular/Lymphatic: No aneurysmal dilation of the abdominal aorta. No adenopathy in the retroperitoneum. No pelvic lymphadenopathy. Reproductive: Post hysterectomy. Other: No free air. Musculoskeletal: No acute bone finding. No destructive bone process. IMPRESSION: 1. 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 Worsening partial small bowel obstruction associated with above findings. 2. Persistent signs of surrounding inflammation with similar appearance. 3. No signs of abscess. 4. Gallbladder distension without pericholecystic stranding or wall thickening. Correlate with any RIGHT upper quadrant symptoms or laboratory abnormalities as warranted. 5. Aortic atherosclerosis. Aortic Atherosclerosis (ICD10-I70.0). Electronically Signed   By: Zetta Bills M.D.   On: 06/27/2020 16:25   DG Abd Portable 1V  Result Date:  06/28/2020 CLINICAL DATA:  Small bowel obstruction. EXAM: PORTABLE ABDOMEN - 1 VIEW COMPARISON:  June 27, 2020. FINDINGS: Dilated small bowel loops are noted concerning for distal small bowel obstruction. No colonic dilatation is noted. Residual contrast is noted in the colon. Distal tip of nasogastric tube is seen in the expected position of the stomach. IMPRESSION: Dilated small bowel loops are noted concerning for distal small bowel obstruction. Electronically Signed   By: Marijo Conception M.D.   On: 06/28/2020 08:12   DG Abd Portable 1V  Result Date: 06/27/2020 CLINICAL DATA:  NG tube placement EXAM: PORTABLE ABDOMEN - 1 VIEW COMPARISON:  06/27/2020 FINDINGS: Esophageal tube tip overlies the gastric fundus. Contrast within the renal collecting systems. Dilated small bowel in the upper abdomen. IMPRESSION: Esophageal tube tip overlies the gastric fundus. Electronically Signed   By: Donavan Foil M.D.   On: 06/27/2020 20:47     LOS: 1 day   Antonieta Pert, MD [facility]  06/28/2020, 10:33 AM

## 2020-06-28 NOTE — Plan of Care (Signed)

## 2020-06-29 DIAGNOSIS — E43 Unspecified severe protein-calorie malnutrition: Secondary | ICD-10-CM

## 2020-06-29 LAB — BASIC METABOLIC PANEL
Anion gap: 12 (ref 5–15)
BUN: 9 mg/dL (ref 6–20)
CO2: 24 mmol/L (ref 22–32)
Calcium: 8.9 mg/dL (ref 8.9–10.3)
Chloride: 103 mmol/L (ref 98–111)
Creatinine, Ser: 0.65 mg/dL (ref 0.44–1.00)
GFR calc Af Amer: >60 mL/min (ref >60)
GFR calc non Af Amer: >60 mL/min (ref >60)
Glucose, Bld: 156 mg/dL — ABNORMAL HIGH (ref 70–99)
Potassium: 3.8 mmol/L (ref 3.5–5.1)
Sodium: 139 mmol/L (ref 135–145)

## 2020-06-29 LAB — GLUCOSE, CAPILLARY
Glucose-Capillary: 112 mg/dL — ABNORMAL HIGH (ref 70–99)
Glucose-Capillary: 251 mg/dL — ABNORMAL HIGH (ref 70–99)

## 2020-06-29 LAB — PHOSPHORUS: Phosphorus: 3.6 mg/dL (ref 2.5–4.6)

## 2020-06-29 LAB — MAGNESIUM: Magnesium: 2.1 mg/dL (ref 1.7–2.4)

## 2020-06-29 MED ORDER — SODIUM CHLORIDE 0.9 % IV SOLN: INTRAVENOUS | Status: DC

## 2020-06-29 MED ORDER — TRAVASOL 10 % IV SOLN
INTRAVENOUS | Status: DC
  Filled 2020-06-29: qty 302.4

## 2020-06-29 MED ORDER — METHYLPREDNISOLONE SODIUM SUCC 40 MG IJ SOLR
40.0000 mg | Freq: Three times a day (TID) | INTRAMUSCULAR | Status: DC
  Administered 2020-06-29 – 2020-07-01 (×6): 40 mg via INTRAVENOUS
  Filled 2020-06-29 (×6): qty 1

## 2020-06-29 MED ORDER — ZOLPIDEM TARTRATE 5 MG PO TABS
5.0000 mg | ORAL_TABLET | Freq: Once | ORAL | Status: AC
  Administered 2020-06-29: 5 mg via ORAL
  Filled 2020-06-29: qty 1

## 2020-06-29 MED ORDER — BOOST / RESOURCE BREEZE PO LIQD CUSTOM
1.0000 | Freq: Three times a day (TID) | ORAL | Status: DC
  Administered 2020-06-29 – 2020-07-03 (×7): 1 via ORAL

## 2020-06-29 MED ORDER — INSULIN ASPART 100 UNIT/ML ~~LOC~~ SOLN
0.0000 [IU] | Freq: Four times a day (QID) | SUBCUTANEOUS | Status: DC
  Administered 2020-06-29: 5 [IU] via SUBCUTANEOUS

## 2020-06-29 MED ORDER — HYDROCHLOROTHIAZIDE 12.5 MG PO CAPS
12.5000 mg | ORAL_CAPSULE | Freq: Every day | ORAL | Status: DC
  Administered 2020-06-29 – 2020-06-30 (×2): 12.5 mg via ORAL
  Filled 2020-06-29 (×2): qty 1

## 2020-06-29 MED ORDER — LOSARTAN POTASSIUM-HCTZ 50-12.5 MG PO TABS: 1.0000 | ORAL_TABLET | Freq: Every day | ORAL | Status: DC

## 2020-06-29 MED ORDER — LOSARTAN POTASSIUM 50 MG PO TABS
50.0000 mg | ORAL_TABLET | Freq: Every day | ORAL | Status: DC
  Administered 2020-06-29 – 2020-06-30 (×2): 50 mg via ORAL
  Filled 2020-06-29 (×2): qty 1

## 2020-06-29 NOTE — Progress Notes (Signed)
Subjective/Chief Complaint: Pt with no pain   Objective: Vital signs in last 24 hours: Temp:  [97.4 F (36.3 C)-98.2 F (36.8 C)] 97.4 F (36.3 C) (08/27 0634) Pulse Rate:  [70-83] 70 (08/27 0634) Resp:  [18-19] 18 (08/27 0634) BP: (168-176)/(95-101) 176/101 (08/27 0634) SpO2:  [99 %-100 %] 100 % (08/27 0634) Last BM Date: 06/28/20  Intake/Output from previous day: 08/26 0701 - 08/27 0700 In: 1664.6 [P.O.:60; I.V.:1604.6] Out: 1600 [Urine:1300; Emesis/NG output:300] Intake/Output this shift: No intake/output data recorded.  PE:  Constitutional: No acute distress, conversant, appears states age. Eyes: Anicteric sclerae, moist conjunctiva, no lid lag Lungs: Clear to auscultation bilaterally, normal respiratory effort CV: regular rate and rhythm, no murmurs, no peripheral edema, pedal pulses 2+ GI: Soft, no masses or hepatosplenomegaly, non-tender to palpation Skin: No rashes, palpation reveals normal turgor Psychiatric: appropriate judgment and insight, oriented to person, place, and time   Lab Results:  Recent Labs    06/27/20 0727 06/28/20 0512  WBC 12.8* 11.3*  HGB 15.2* 13.3  HCT 45.7 42.1  PLT 384 318   BMET Recent Labs    06/27/20 0727 06/28/20 0512  NA 140 139  K 2.9* 4.3  CL 98 105  CO2 29 26  GLUCOSE 149* 87  BUN 12 12  CREATININE 0.85 0.78  CALCIUM 9.2 8.2*   PT/INR No results for input(s): LABPROT, INR in the last 72 hours. ABG No results for input(s): PHART, HCO3 in the last 72 hours.  Invalid input(s): PCO2, PO2  Studies/Results: CT ABDOMEN PELVIS W CONTRAST  Result Date: 06/27/2020 CLINICAL DATA:  Bowel obstruction is suspected EXAM: CT ABDOMEN AND PELVIS WITH CONTRAST TECHNIQUE: Multidetector CT imaging of the abdomen and pelvis was performed using the standard protocol following bolus administration of intravenous contrast. CONTRAST:  163m OMNIPAQUE IOHEXOL 300 MG/ML  SOLN COMPARISON:  June 12, 2020 FINDINGS: Lower chest:  Incidental imaging of the lung bases is unremarkable. No consolidation. No pleural effusion. Hepatobiliary: Gallbladder is distended with sludge and or partially radiopaque stones in the lumen. No pericholecystic stranding. No biliary duct dilation. No focal, suspicious hepatic lesion. Pancreas: Pancreas is normal without focal lesion or ductal dilation. Spleen: Spleen normal in size and contour. Adrenals/Urinary Tract: Adrenal glands are normal. Kidneys enhance symmetrically. No hydronephrosis. Urinary bladder is unremarkable aside from stranding about the bladder in the setting of complex fistula in the pelvis. Limited assessment limiting assessment of the urinary bladder. No bladder wall thickening. Stomach/Bowel: Marked bowel dilation of small bowel in the setting of known Crohn's disease. Bowel is increasingly distended as compared to the previous study approximately 4.7 cm just above the level of mural stratification and narrowing in the distal ileum that is associated with and intro enteric as well as potentially enterocolonic fistula in this location. Colonic thickening is slightly improved perhaps compared to the prior study but there is still extensive inflammation in the pelvis signs of perienteric stranding. Mid small bowel loops are also increasingly dilated to approximately 4.8 cm. Areas in the mid small bowel previously approximately 4.4 cm. Vascular/Lymphatic: No aneurysmal dilation of the abdominal aorta. No adenopathy in the retroperitoneum. No pelvic lymphadenopathy. Reproductive: Post hysterectomy. Other: No free air. Musculoskeletal: No acute bone finding. No destructive bone process. IMPRESSION: 1. 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 Worsening partial small bowel obstruction associated with above findings. 2. Persistent signs of surrounding inflammation with  similar appearance.  3. No signs of abscess. 4. Gallbladder distension without pericholecystic stranding or wall thickening. Correlate with any RIGHT upper quadrant symptoms or laboratory abnormalities as warranted. 5. Aortic atherosclerosis. Aortic Atherosclerosis (ICD10-I70.0). Electronically Signed   By: Zetta Bills M.D.   On: 06/27/2020 16:25   DG Abd Portable 1V  Result Date: 06/28/2020 CLINICAL DATA:  Small bowel obstruction. EXAM: PORTABLE ABDOMEN - 1 VIEW COMPARISON:  June 27, 2020. FINDINGS: Dilated small bowel loops are noted concerning for distal small bowel obstruction. No colonic dilatation is noted. Residual contrast is noted in the colon. Distal tip of nasogastric tube is seen in the expected position of the stomach. IMPRESSION: Dilated small bowel loops are noted concerning for distal small bowel obstruction. Electronically Signed   By: Marijo Conception M.D.   On: 06/28/2020 08:12   DG Abd Portable 1V  Result Date: 06/27/2020 CLINICAL DATA:  NG tube placement EXAM: PORTABLE ABDOMEN - 1 VIEW COMPARISON:  06/27/2020 FINDINGS: Esophageal tube tip overlies the gastric fundus. Contrast within the renal collecting systems. Dilated small bowel in the upper abdomen. IMPRESSION: Esophageal tube tip overlies the gastric fundus. Electronically Signed   By: Donavan Foil M.D.   On: 06/27/2020 20:47   [place] EKG SITE RITE  Result Date: 06/28/2020 If Site Rite image not attached, placement could not be confirmed due to current cardiac rhythm.   Assessment/Plan: HTN GERD RLS Depression/Anxiety  Crohn's ileitis with SBO and fistulas - outpatient CT showed 8/11 showed high-grade sbo, E-E fistulas and fistulous connection to sigmoid colon - CT yesterday showed increased chronic distention and worsening psbo related to fistulas - Pt doing well.  Will giver her a shot at liquids and see if things will pass.  If not I d/w pt she may need surgery as early as next week  -Currently on steroids per  GI   FEN: clamp NGT, fulls, IVF VTE: lovenox  ID: no current abx   LOS: 2 days    Ralene Ok 06/29/2020

## 2020-06-29 NOTE — Progress Notes (Signed)
[facility] Progress Note  Chief Complaint: Ileal Crohn's disease with fistula and obstruction  History:  She continues to have no abdominal pain, nausea or vomiting with NG tube in place.  Surgical consultant saw her this morning and recommended turning off suction and starting a full liquid diet. Cynthia [name] was upset because she felt there were conflicting plans regarding her diet, the NG tube, and perhaps a plan for her to go home on TPN soon. She is also upset because she feels that she did not receive the outpatient care she needed for the severe symptoms in a timely fashion.  Passing some gas, no passage of stool or liquid today.  ROS: Cardiovascular: Denies chest pain Respiratory: Denies dyspnea Urinary: Denies dysuria  Objective:   Current Facility-Administered Medications:  .  0.9 %  sodium chloride infusion, , Intravenous, Continuous, Lenis Noon, [facility], Last Rate: 75 mL/hr at 06/29/20 1128, New Bag at 06/29/20 1128 .  0.9 %  sodium chloride infusion, , Intravenous, Continuous, Swayne, Mary M, RPH .  acetaminophen (TYLENOL) tablet 650 mg, 650 mg, Oral, Q6H PRN **OR** acetaminophen (TYLENOL) suppository 650 mg, 650 mg, Rectal, Q6H PRN, Donne Hazel, MD .  Chlorhexidine Gluconate Cloth 2 % PADS 6 each, 6 each, Topical, Daily, Antonieta Pert, MD, 6 each at 06/29/20 1127 .  enoxaparin (LOVENOX) injection 40 mg, 40 mg, Subcutaneous, Q24H, Donne Hazel, MD, 40 mg at 06/28/20 1826 .  feeding supplement (BOOST / RESOURCE BREEZE) liquid 1 Container, 1 Container, Oral, TID BM, Kc, Ramesh, MD .  losartan (COZAAR) tablet 50 mg, 50 mg, Oral, Daily **AND** hydrochlorothiazide (MICROZIDE) capsule 12.5 mg, 12.5 mg, Oral, Daily, Kc, Ramesh, MD .  insulin aspart (novoLOG) injection 0-9 Units, 0-9 Units, Subcutaneous, Q6H, Lenis Noon, RPH .  methylPREDNISolone sodium succinate (SOLU-MEDROL) 125 mg/2 mL injection 60 mg, 60 mg, Intravenous, Q8H, Danis, Estill Cotta III, MD, 60 mg at 06/29/20 0522 .   morphine 2 MG/ML injection 2 mg, 2 mg, Intravenous, Q2H PRN, Donne Hazel, MD, 2 mg at 06/29/20 1127 .  ondansetron (ZOFRAN) injection 4 mg, 4 mg, Intravenous, Q6H PRN, Donne Hazel, MD, 4 mg at 06/28/20 0403 .  pantoprazole (PROTONIX) injection 40 mg, 40 mg, Intravenous, Q12H, Norm Parcel, PA-C, 40 mg at 06/29/20 1127 .  phenol (CHLORASEPTIC) mouth spray 1 spray, 1 spray, Mouth/Throat, PRN, Donne Hazel, MD, 1 spray at 06/27/20 2130 .  sodium chloride flush (NS) 0.9 % injection 10-40 mL, 10-40 mL, Intracatheter, Q12H, Kc, Ramesh, MD, 10 mL at 06/29/20 1127 .  sodium chloride flush (NS) 0.9 % injection 10-40 mL, 10-40 mL, Intracatheter, PRN, Kc, Ramesh, MD .  TPN ADULT (ION), , Intravenous, Continuous TPN, Lenis Noon, RPH  . sodium chloride 75 mL/hr at 06/29/20 1128  . sodium chloride    . TPN ADULT (ION)       Vital signs in last 24 hrs: Vitals:   06/28/20 2112 06/29/20 0634  BP: (!) 176/95 (!) 176/101  Pulse: 72 70  Resp: 18 18  Temp: 98.2 F (36.8 C) (!) 97.4 F (36.3 C)  SpO2: 99% 100%    Intake/Output Summary (Last 24 hours) at 06/29/2020 1223 Last data filed at 06/29/2020 1128 Gross per 24 hour  Intake 1830.56 ml  Output 1650 ml  Net 180.56 ml     Physical Exam NG tube drainage was minimal from last night to this morning.  Dark bilious with some coffee-ground material Guarded affect Boyfriend present entire visit  HEENT: sclera anicteric, oral mucosa without lesions  Neck: supple, no thyromegaly, JVD or lymphadenopathy  Cardiac: RRR without murmurs, S1S2 heard, no peripheral edema  Pulm: clear to auscultation bilaterally, normal RR and effort noted  Abdomen: soft, no tenderness, nondistended with active bowel sounds. No guarding or palpable hepatosplenomegaly  Skin; warm and dry, no jaundice  Recent Labs:  CBC Latest Ref Rng & Units 06/28/2020 06/27/2020 05/31/2020  WBC 4.0 - 10.5 K/uL 11.3(H) 12.8(H) 10.3  Hemoglobin 12.0 - 15.0 g/dL 13.3  15.2(H) 14.0  Hematocrit 36 - 46 % 42.1 45.7 41.5  Platelets 150 - 400 K/uL 318 384 325.0    No results for input(s): INR in the last 168 hours. 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  I think we should take this more slowly regarding the diet.  TPN nutrition has seen her, and I think she will need that into at least early next week. I am okay with sips of clear liquids for now but that is all.  Orders placed. Fine with me to keep NG tube off of suction for now.  If patient develops abdominal pain with distention and/or vomiting, NG tube should be placed back on low intermittent suction.  It would be very helpful if primary team and consultants confer regarding management of her diet and NG tube going forward since she is frustrated by the perception of a lack of care coordination.  I have also decrease her steroids to 40 mg IV every 8 hours.  We will follow her over the weekend.  Total time 35 minutes.  Nelida Meuse III Office: [phone]

## 2020-06-29 NOTE — Progress Notes (Signed)
Patient had a reaction to TNA per RN Larene Beach, so TNA was stopped by RN, and IV team consult placed to discontinue TNA.

## 2020-06-29 NOTE — Plan of Care (Signed)

## 2020-06-29 NOTE — Progress Notes (Signed)
Nutrition Follow-up  INTERVENTION:   -Boost Breeze po TID, each supplement provides 250 kcal and 9 grams of protein  -TPN management per Pharmacy  NUTRITION DIAGNOSIS:   Increased nutrient needs related to  (Crohn's ileitis) as evidenced by estimated needs.  Ongoing  GOAL:   Patient will meet greater than or equal to 90% of their needs  Progressing  MONITOR:   Diet advancement, Labs, Weight trends, I & O's (TPN)  REASON FOR ASSESSMENT:   Consult New TPN/TNA  ASSESSMENT:   59 y.o. female with medical history significant of Crohn's disease, depression, GERD who presents to the emergency department with complaints of progressively worsening abdominal discomfort.  Patient has Crohn's disease and is followed closely by gastroenterology.  Patient did report some diarrhea 1 day prior to hospital admission  8/26: PICC line placed  Per surgery note today, pt to have trial of liquid diet to see if she can tolerate. NGT clamped. Will order Boost Breeze as well.  Will provide education closer to discharge.  TPN to initiate at 30 ml/hr today, providing 685 kcals and 30g protein.  Admission weight: 165 lbs.  Labs reviewed. Medications reviewed.  Diet Order:   Diet Order            Diet full liquid Room service appropriate? Yes; Fluid consistency: Thin  Diet effective now                 EDUCATION NEEDS:   No education needs have been identified at this time  Skin:  Skin Assessment: Reviewed RN Assessment  Last BM:  8/26  Height:   Ht Readings from Last 1 Encounters:  06/27/20 5' 3"  (1.6 m)    Weight:   Wt Readings from Last 1 Encounters:  06/27/20 74.8 kg   BMI:  Body mass index is 29.23 kg/m.  Estimated Nutritional Needs:   Kcal:  1800-2000  Protein:  70-85g  Fluid:  2L/day  Clayton Bibles, MS, RD, LDN Inpatient Clinical Dietitian Contact information available via Amion

## 2020-06-29 NOTE — Progress Notes (Signed)
PROGRESS NOTE    Cynthia [name]  NOM:[id number] DOB: May 31, 1961 DOA: 06/27/2020 PCP: Denita Lung, MD   Chief Complaint  Patient presents with  . Abdominal Pain    Brief Narrative:  59 y.o. female with medical history significant of Crohn's disease, depression, GERD who presents to the emergency department with complaints of progressively worsening abdominal discomfort.  Patient has Crohn's disease and is followed closely by gastroenterology.  Patient did report some diarrhea 1 day prior to hospital admission  ED Course: In the emergency department, patient underwent abdominal x-ray with findings consistent with bowel obstruction.  Given progressive Crohn's disease with obstruction, gastroenterology and general surgery were consulted.  Hospitalist consulted for consideration for medical admission  Subjective:  This morning feels okay NG tube output has slowed down.  Abdominal pain better.  Reports her surgeon is planning for NG clamping and liquid diet.   Assessment & Plan:  Crohn's ileitis, with intestinal obstruction SBO and fistulas seen in the outpatient CT: Repeat CT in the ED with increasing chronic distention of the small bowel in the setting of fistula and stricture of the distal ileum, enteroenteric and enterocolonic fistula edition present, but worsening small bowel obstruction and persistent signs of surrounding inflammation with similar appearance no signs of abscess, GB distended without pericholecystic stranding or wall thickening.  Surgery and GI on board.  Appreciate input plan is for IV steroid trial, liquid diet and see she is able to tolerate.  Also plan for PICC line (placed yesterday) and TPN if needed.  Appreciate input.  GERD: Continue PPI.  Hypertension: On losartan/HCTZ at home.  BP poorly controlled.on  vasotec 6.25 mg Q8H w/ holding parameters while n.p.o., will DC the IV Vasotec and resume losartan HCTZ.   Hypokalemia -resolved  DVT prophylaxis:  enoxaparin (LOVENOX) injection 40 mg Start: 06/27/20 1800 Code Status:   Code Status: Full Code  Family Communication: plan of care discussed with patient at bedside.  Status is: Inpatient  Remains inpatient appropriate because:IV treatments appropriate due to intensity of illness or inability to take PO and Inpatient level of care appropriate due to severity of illness  Dispo: The patient is from: Home              Anticipated d/c is to: Home              Anticipated d/c date is: 3 days              Patient currently is not medically stable to d/c.   Diet Order            Diet full liquid Room service appropriate? Yes; Fluid consistency: Thin  Diet effective now                 Body mass index is 29.23 kg/m.  Consultants:see note  Procedures:see note Microbiology:see note Blood Culture    Component Value Date/Time   SDES URINE, RANDOM 11/14/2015 1642   SPECREQUEST NONE 11/14/2015 1642   CULT MULTIPLE SPECIES PRESENT, SUGGEST RECOLLECTION 11/14/2015 1642   REPTSTATUS 11/16/2015 FINAL 11/14/2015 1642    Other culture-see note  Medications: Scheduled Meds: . Chlorhexidine Gluconate Cloth  6 each Topical Daily  . enalaprilat  0.625 mg Intravenous Q8H  . enoxaparin (LOVENOX) injection  40 mg Subcutaneous Q24H  . methylPREDNISolone (SOLU-MEDROL) injection  60 mg Intravenous Q8H  . pantoprazole (PROTONIX) IV  40 mg Intravenous Q12H  . sodium chloride flush  10-40 mL Intracatheter Q12H   Continuous Infusions: .  sodium chloride 75 mL/hr at 06/29/20 0600    Antimicrobials: Anti-infectives (From admission, onward)   None       Objective: Vitals: Today's Vitals   06/29/20 0233 06/29/20 0523 06/29/20 0553 06/29/20 0634  BP:    (!) 176/101  Pulse:    70  Resp:    18  Temp:    (!) 97.4 F (36.3 C)  TempSrc:    Oral  SpO2:    100%  Weight:      Height:      PainSc: Asleep 7  Asleep     Intake/Output Summary (Last 24 hours) at 06/29/2020 1059 Last data filed  at 06/29/2020 1000 Gross per 24 hour  Intake 1387.56 ml  Output 1650 ml  Net -262.44 ml   Filed Weights   06/27/20 2100  Weight: 74.8 kg   Weight change:    Intake/Output from previous day: 08/26 0701 - 08/27 0700 In: 1664.6 [P.O.:60; I.V.:1604.6] Out: 1600 [Urine:1300; Emesis/NG output:300] Intake/Output this shift: Total I/O In: -  Out: 150 [Emesis/NG output:150]  Examination:  General exam: AAO 3, NG tube +,NAD,weak appearing. HEENT:Oral mucosa moist,Ear/Nose WNL grossly, dentition normal. Respiratory system: bilaterally clear,no wheezing or crackles,no use of accessory muscle. Cardiovascular system:S1 & S2 +,No JVD. Gastrointestinal system:Abdomen soft, less distended and less tender, bowel sounds sluggish present  Nervous System:Alert, awake, moving extremities and grossly nonfocal. Extremities:No edema, distal peripheral pulses palpable.  Skin:No rashes,no icterus. BHA:LPFXTK muscle bulk,tone, power.  Data Reviewed: I have personally reviewed following labs and imaging studies CBC: Recent Labs  Lab 06/27/20 0727 06/28/20 0512  WBC 12.8* 11.3*  HGB 15.2* 13.3  HCT 45.7 42.1  MCV 86.1 92.7  PLT 384 240   Basic Metabolic Panel: Recent Labs  Lab 06/27/20 0727 06/28/20 0512 06/29/20 1013  NA 140 139 139  K 2.9* 4.3 3.8  CL 98 105 103  CO2 29 26 24   GLUCOSE 149* 87 156*  BUN 12 12 9   CREATININE 0.85 0.78 0.65  CALCIUM 9.2 8.2* 8.9  MG 1.9  --  2.1  PHOS  --   --  3.6   GFR: Estimated Creatinine Clearance: 72.5 mL/min (by C-G formula based on SCr of 0.65 mg/dL). Liver Function Tests: Recent Labs  Lab 06/27/20 0727 06/28/20 0512  AST 13* 18  ALT 10 10  ALKPHOS 61 57  BILITOT 0.5 0.9  PROT 7.1 6.3*  ALBUMIN 3.3* 2.8*   Recent Labs  Lab 06/27/20 0727  LIPASE 22   No results for input(s): AMMONIA in the last 168 hours. Coagulation Profile: No results for input(s): INR, PROTIME in the last 168 hours. Cardiac Enzymes: No results for  input(s): CKTOTAL, CKMB, CKMBINDEX, TROPONINI in the last 168 hours. BNP (last 3 results) No results for input(s): PROBNP in the last 8760 hours. HbA1C: No results for input(s): HGBA1C in the last 72 hours. CBG: No results for input(s): GLUCAP in the last 168 hours. Lipid Profile: No results for input(s): CHOL, HDL, LDLCALC, TRIG, CHOLHDL, LDLDIRECT in the last 72 hours. Thyroid Function Tests: No results for input(s): TSH, T4TOTAL, FREET4, T3FREE, THYROIDAB in the last 72 hours. Anemia Panel: No results for input(s): VITAMINB12, FOLATE, FERRITIN, TIBC, IRON, RETICCTPCT in the last 72 hours. Sepsis Labs: No results for input(s): PROCALCITON, LATICACIDVEN in the last 168 hours.  Recent Results (from the past 240 hour(s))  SARS Coronavirus 2 by RT PCR (hospital order, performed in [facility] hospital lab) Nasopharyngeal Nasopharyngeal Swab     Status: None  Collection Time: 06/27/20 10:56 AM   Specimen: Nasopharyngeal Swab  Result Value Ref Range Status   SARS Coronavirus 2 NEGATIVE NEGATIVE Final    Comment: (NOTE) SARS-CoV-2 target nucleic acids are NOT DETECTED.  The SARS-CoV-2 RNA is generally detectable in upper and lower respiratory specimens during the acute phase of infection. The lowest concentration of SARS-CoV-2 viral copies this assay can detect is 250 copies / mL. A negative result does not preclude SARS-CoV-2 infection and should not be used as the sole basis for treatment or other patient management decisions.  A negative result may occur with improper specimen collection / handling, submission of specimen other than nasopharyngeal swab, presence of viral mutation(s) within the areas targeted by this assay, and inadequate number of viral copies (<250 copies / mL). A negative result must be combined with clinical observations, patient history, and epidemiological information.  Fact Sheet for Patients:   StrictlyIdeas.no  Fact Sheet for  Healthcare Providers: BankingDealers.co.za  This test is not yet approved or  cleared by the [place] FDA and has been authorized for detection and/or diagnosis of SARS-CoV-2 by FDA under an Emergency Use Authorization (EUA).  This EUA will remain in effect (meaning this test can be used) for the duration of the COVID-19 declaration under Section 564(b)(1) of the Act, 21 U.S.C. section 360bbb-3(b)(1), unless the authorization is terminated or revoked sooner.  Performed at [facility], [place] [address], [place], [place] [address]       Radiology Studies: CT ABDOMEN PELVIS W CONTRAST  Result Date: 06/27/2020 CLINICAL DATA:  Bowel obstruction is suspected EXAM: CT ABDOMEN AND PELVIS WITH CONTRAST TECHNIQUE: Multidetector CT imaging of the abdomen and pelvis was performed using the standard protocol following bolus administration of intravenous contrast. CONTRAST:  15m OMNIPAQUE IOHEXOL 300 MG/ML  SOLN COMPARISON:  June 12, 2020 FINDINGS: Lower chest: Incidental imaging of the lung bases is unremarkable. No consolidation. No pleural effusion. Hepatobiliary: Gallbladder is distended with sludge and or partially radiopaque stones in the lumen. No pericholecystic stranding. No biliary duct dilation. No focal, suspicious hepatic lesion. Pancreas: Pancreas is normal without focal lesion or ductal dilation. Spleen: Spleen normal in size and contour. Adrenals/Urinary Tract: Adrenal glands are normal. Kidneys enhance symmetrically. No hydronephrosis. Urinary bladder is unremarkable aside from stranding about the bladder in the setting of complex fistula in the pelvis. Limited assessment limiting assessment of the urinary bladder. No bladder wall thickening. Stomach/Bowel: Marked bowel dilation of small bowel in the setting of known Crohn's disease. Bowel is increasingly distended as compared to the previous study approximately 4.7 cm just above the level of  mural stratification and narrowing in the distal ileum that is associated with and intro enteric as well as potentially enterocolonic fistula in this location. Colonic thickening is slightly improved perhaps compared to the prior study but there is still extensive inflammation in the pelvis signs of perienteric stranding. Mid small bowel loops are also increasingly dilated to approximately 4.8 cm. Areas in the mid small bowel previously approximately 4.4 cm. Vascular/Lymphatic: No aneurysmal dilation of the abdominal aorta. No adenopathy in the retroperitoneum. No pelvic lymphadenopathy. Reproductive: Post hysterectomy. Other: No free air. Musculoskeletal: No acute bone finding. No destructive bone process. IMPRESSION: 1. 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 Worsening partial small bowel obstruction associated with above findings. 2. Persistent signs of surrounding inflammation with similar appearance. 3. No signs of abscess.  4. Gallbladder distension without pericholecystic stranding or wall thickening. Correlate with any RIGHT upper quadrant symptoms or laboratory abnormalities as warranted. 5. Aortic atherosclerosis. Aortic Atherosclerosis (ICD10-I70.0). Electronically Signed   By: Zetta Bills M.D.   On: 06/27/2020 16:25   DG Abd Portable 1V  Result Date: 06/28/2020 CLINICAL DATA:  Small bowel obstruction. EXAM: PORTABLE ABDOMEN - 1 VIEW COMPARISON:  June 27, 2020. FINDINGS: Dilated small bowel loops are noted concerning for distal small bowel obstruction. No colonic dilatation is noted. Residual contrast is noted in the colon. Distal tip of nasogastric tube is seen in the expected position of the stomach. IMPRESSION: Dilated small bowel loops are noted concerning for distal small bowel obstruction. Electronically Signed   By: Marijo Conception M.D.   On: 06/28/2020 08:12     DG Abd Portable 1V  Result Date: 06/27/2020 CLINICAL DATA:  NG tube placement EXAM: PORTABLE ABDOMEN - 1 VIEW COMPARISON:  06/27/2020 FINDINGS: Esophageal tube tip overlies the gastric fundus. Contrast within the renal collecting systems. Dilated small bowel in the upper abdomen. IMPRESSION: Esophageal tube tip overlies the gastric fundus. Electronically Signed   By: Donavan Foil M.D.   On: 06/27/2020 20:47   [place] EKG SITE RITE  Result Date: 06/28/2020 If Site Rite image not attached, placement could not be confirmed due to current cardiac rhythm.    LOS: 2 days   Antonieta Pert, MD [facility]  06/29/2020, 10:59 AM

## 2020-06-29 NOTE — Progress Notes (Signed)
Pt had a reaction almost immediately after start of TPN got pain shooting through her arm to chest and almost felt like she couldn't breath the pain felt funny down her back across her thigh and I stopped the TPN and assessed her. She is stable now and MD notified as well as pharmacy. Will reassess tomorrow about tpn needs

## 2020-06-29 NOTE — Progress Notes (Signed)
PHARMACY - TOTAL PARENTERAL NUTRITION CONSULT NOTE   Indication: Intolerance to enteral feeding  Patient Measurements: Height: 5' 3"  (160 cm) Weight: 74.8 kg (165 lb) IBW/kg (Calculated) : 52.4 TPN AdjBW (KG): 58 Body mass index is 29.23 kg/m. Usual Weight: Pt reports ~10% weight loss over the past 4 months.   Assessment:  Pt is a 53yoF with PMH significant for Chron's disease. Presented with abdominal pain and diarrhea. Outpatient CT on 8/10: SBO, fistula. CT on admission revealed worsening partial SBO, E-E fistula, and fistulous connection to sigmoid colon. Pharmacy consulted to manage TPN.  Glucose / Insulin: No history of DM.  Electrolytes: All WNL including CorrCa 9.9 Renal: SCr & BUN - WNL, stable LFTs / TGs: LFT WNL on 8/26 Prealbumin / albumin: Albumin low (2.8) Intake / Output; MIVF: UOP 1300 mL, NGT/emesis output: 300 mL. I/O net: 65 mL. MIVF: NaCl 75 mL/hr GI Imaging: -8/11: Abd CT: Partial SBO, small bowel to small bowel fistula and probable fistulous communication of sigmoid colon -8/25 Abd CT: Increasing distension of small bowel, worsening partial SBO. E-E fistula Surgeries / Procedures:   Central access: Double lumen PICC placed 8/26 TPN start date: 8/27  Nutritional Goals (per RD recommendation on 8/26): kCal: 1800-2000, Protein: 70-85, Fluid: 2L/day  Goal TPN rate is 83 mL/hr (provides 84 g of protein and 1896 kcals per day)  Current Nutrition:  Full liquid diet, TPN  Plan:   Start TPN at 30 mL/hr at 1800  Electrolytes in TPN: Standard  61mq/L of Na, 561m/L of K, 33m49mL of Ca, 33mE62m of Mg, and 133mm333m of Phos. Cl:Ac ratio 1:1  Add standard MVI and trace elements to TPN  Initiate Sensitive q6h SSI and adjust as needed   Reduce MIVF to 45 mL/hr at 1800  Monitor TPN labs on Mon/Thurs, recheck electrolytes with AM labs tomorrow  Regenia Erck Lenis NoonrmD 06/29/2020,9:38 AM

## 2020-06-30 LAB — DIFFERENTIAL
Abs Immature Granulocytes: 0.12 10*3/uL — ABNORMAL HIGH (ref 0.00–0.07)
Basophils Absolute: 0 10*3/uL (ref 0.0–0.1)
Basophils Relative: 0 %
Eosinophils Absolute: 0 10*3/uL (ref 0.0–0.5)
Eosinophils Relative: 0 %
Immature Granulocytes: 1 %
Lymphocytes Relative: 10 %
Lymphs Abs: 1.5 10*3/uL (ref 0.7–4.0)
Monocytes Absolute: 0.3 10*3/uL (ref 0.1–1.0)
Monocytes Relative: 2 %
Neutro Abs: 12.8 10*3/uL — ABNORMAL HIGH (ref 1.7–7.7)
Neutrophils Relative %: 87 %

## 2020-06-30 LAB — GLUCOSE, CAPILLARY
Glucose-Capillary: 110 mg/dL — ABNORMAL HIGH (ref 70–99)
Glucose-Capillary: 179 mg/dL — ABNORMAL HIGH (ref 70–99)
Glucose-Capillary: 217 mg/dL — ABNORMAL HIGH (ref 70–99)

## 2020-06-30 LAB — COMPREHENSIVE METABOLIC PANEL
ALT: 10 U/L (ref 0–44)
AST: 11 U/L — ABNORMAL LOW (ref 15–41)
Albumin: 2.8 g/dL — ABNORMAL LOW (ref 3.5–5.0)
Alkaline Phosphatase: 60 U/L (ref 38–126)
Anion gap: 9 (ref 5–15)
BUN: 9 mg/dL (ref 6–20)
CO2: 26 mmol/L (ref 22–32)
Calcium: 8.8 mg/dL — ABNORMAL LOW (ref 8.9–10.3)
Chloride: 104 mmol/L (ref 98–111)
Creatinine, Ser: 0.61 mg/dL (ref 0.44–1.00)
GFR calc Af Amer: >60 mL/min (ref >60)
GFR calc non Af Amer: >60 mL/min (ref >60)
Glucose, Bld: 133 mg/dL — ABNORMAL HIGH (ref 70–99)
Potassium: 3.6 mmol/L (ref 3.5–5.1)
Sodium: 139 mmol/L (ref 135–145)
Total Bilirubin: 0.5 mg/dL (ref 0.3–1.2)
Total Protein: 6.1 g/dL — ABNORMAL LOW (ref 6.5–8.1)

## 2020-06-30 LAB — PHOSPHORUS: Phosphorus: 3.3 mg/dL (ref 2.5–4.6)

## 2020-06-30 LAB — CBC
HCT: 40.5 % (ref 36.0–46.0)
Hemoglobin: 12.9 g/dL (ref 12.0–15.0)
MCH: 28.7 pg (ref 26.0–34.0)
MCHC: 31.9 g/dL (ref 30.0–36.0)
MCV: 90.2 fL (ref 80.0–100.0)
Platelets: 237 10*3/uL (ref 150–400)
RBC: 4.49 MIL/uL (ref 3.87–5.11)
RDW: 13.6 % (ref 11.5–15.5)
WBC: 14.8 10*3/uL — ABNORMAL HIGH (ref 4.0–10.5)
nRBC: 0 % (ref 0.0–0.2)

## 2020-06-30 LAB — MAGNESIUM: Magnesium: 1.9 mg/dL (ref 1.7–2.4)

## 2020-06-30 LAB — PREALBUMIN: Prealbumin: 9.5 mg/dL — ABNORMAL LOW (ref 18–38)

## 2020-06-30 LAB — TRIGLYCERIDES: Triglycerides: 72 mg/dL (ref <150)

## 2020-06-30 MED ORDER — HYDRALAZINE HCL 20 MG/ML IJ SOLN
10.0000 mg | Freq: Four times a day (QID) | INTRAMUSCULAR | Status: DC | PRN
  Administered 2020-06-30: 10 mg via INTRAVENOUS
  Filled 2020-06-30 (×2): qty 1

## 2020-06-30 MED ORDER — ZOLPIDEM TARTRATE 5 MG PO TABS
5.0000 mg | ORAL_TABLET | Freq: Every evening | ORAL | Status: DC | PRN
  Administered 2020-06-30 – 2020-07-01 (×2): 5 mg via ORAL
  Filled 2020-06-30 (×2): qty 1

## 2020-06-30 NOTE — Progress Notes (Signed)
   Subjective/Chief Complaint: Feels better.  Very upset about differing messages from differing doctors.  Did not tolerate TPN.   Objective: Vital signs in last 24 hours: Temp:  [97.4 F (36.3 C)-98.4 F (36.9 C)] 97.4 F (36.3 C) (08/28 0607) Pulse Rate:  [72-101] 75 (08/28 0607) Resp:  [16-18] 18 (08/28 0607) BP: (138-169)/(81-106) 157/91 (08/28 0607) SpO2:  [98 %-100 %] 100 % (08/28 0607) Weight:  [72.3 kg-72.5 kg] 72.3 kg (08/28 0607) Last BM Date: 06/28/20  Intake/Output from previous day: 08/27 0701 - 08/28 0700 In: 1697.6 [P.O.:450; I.V.:1247.6] Out: 150 [Emesis/NG output:150] Intake/Output this shift: Total I/O In: 330 [P.O.:30; I.V.:270; NG/GT:30] Out: 0   General appearance: alert and cooperative Resp: clear to auscultation bilaterally Cardio: regular rate and rhythm GI: Soft, nontender.  No palpable mass.  Lab Results:  Recent Labs    06/28/20 0512 06/30/20 0314  WBC 11.3* 14.8*  HGB 13.3 12.9  HCT 42.1 40.5  PLT 318 237   BMET Recent Labs    06/29/20 1013 06/30/20 0314  NA 139 139  K 3.8 3.6  CL 103 104  CO2 24 26  GLUCOSE 156* 133*  BUN 9 9  CREATININE 0.65 0.61  CALCIUM 8.9 8.8*   PT/INR No results for input(s): LABPROT, INR in the last 72 hours. ABG No results for input(s): PHART, HCO3 in the last 72 hours.  Invalid input(s): PCO2, PO2  Studies/Results: [place] EKG SITE RITE  Result Date: 06/28/2020 If Site Rite image not attached, placement could not be confirmed due to current cardiac rhythm.   Anti-infectives: Anti-infectives (From admission, onward)   None      Assessment/Plan: s/p * No surgery found * Advance diet. Allow full liquids but will leave ng in place per her request. No sign of sbo HTN GERD RLS Depression/Anxiety  Crohn's ileitis with SBO and fistulas - outpatientCT showed 8/11 showed high-grade sbo, E-E fistulas and fistulous connection to sigmoid colon -CT yesterday showed increased chronic  distention and worsening psbo related to fistulas - Pt doing well.  Will giver her a shot at liquids and see if things will pass.  If not I d/w pt she may need surgery as early as next week -Currently on steroids per GI   FEN: clamp NGT, fulls,IVF VTE: lovenox  ID: no current abx  LOS: 3 days    Cynthia [name] 06/30/2020

## 2020-06-30 NOTE — Progress Notes (Signed)
[facility] Progress Note  Chief Complaint: Ileal Crohn's disease with bowel obstruction  History:  Cynthia [name] was upset about events of last evening, where very shortly after starting TPN she had pain all down the right side of her body, felt anxious and short of breath.  She did not feel that nursing had attended to that concern soon enough, then the infusion was stopped and the symptoms gradually subsided. She tolerated clear liquids and boost all day yesterday, passing a lot of gas but no stool yet.  No distention or vomiting, and the NG tube remains clamped.  ROS: Cardiovascular: Denies chest pain Respiratory: Denies dyspnea Urinary: Denies dysuria  Objective:   Current Facility-Administered Medications:  .  0.9 %  sodium chloride infusion, , Intravenous, Continuous, Lenis Noon, [facility], Last Rate: 45 mL/hr at 06/29/20 1747, Rate Change at 06/29/20 1747 .  acetaminophen (TYLENOL) tablet 650 mg, 650 mg, Oral, Q6H PRN, 650 mg at 06/30/20 0823 **OR** acetaminophen (TYLENOL) suppository 650 mg, 650 mg, Rectal, Q6H PRN, Donne Hazel, MD .  Chlorhexidine Gluconate Cloth 2 % PADS 6 each, 6 each, Topical, Daily, Antonieta Pert, MD, 6 each at 06/29/20 1127 .  enoxaparin (LOVENOX) injection 40 mg, 40 mg, Subcutaneous, Q24H, Donne Hazel, MD, 40 mg at 06/29/20 1836 .  feeding supplement (BOOST / RESOURCE BREEZE) liquid 1 Container, 1 Container, Oral, TID BM, Antonieta Pert, MD, 1 Container at 06/30/20 [phone] .  losartan (COZAAR) tablet 50 mg, 50 mg, Oral, Daily, 50 mg at 06/29/20 1326 **AND** hydrochlorothiazide (MICROZIDE) capsule 12.5 mg, 12.5 mg, Oral, Daily, Kc, Ramesh, MD, 12.5 mg at 06/29/20 1326 .  insulin aspart (novoLOG) injection 0-9 Units, 0-9 Units, Subcutaneous, Q6H, Lenis Noon, [facility], 5 Units at 06/29/20 1835 .  methylPREDNISolone sodium succinate (SOLU-MEDROL) 40 mg/mL injection 40 mg, 40 mg, Intravenous, Q8H, Danis, Estill Cotta III, MD, 40 mg at 06/30/20 0737 .  morphine 2 MG/ML injection 2  mg, 2 mg, Intravenous, Q2H PRN, Donne Hazel, MD, 2 mg at 06/30/20 [phone] .  ondansetron (ZOFRAN) injection 4 mg, 4 mg, Intravenous, Q6H PRN, Donne Hazel, MD, 4 mg at 06/28/20 0403 .  pantoprazole (PROTONIX) injection 40 mg, 40 mg, Intravenous, Q12H, Norm Parcel, PA-C, 40 mg at 06/29/20 2208 .  phenol (CHLORASEPTIC) mouth spray 1 spray, 1 spray, Mouth/Throat, PRN, Donne Hazel, MD, 1 spray at 06/27/20 2130 .  sodium chloride flush (NS) 0.9 % injection 10-40 mL, 10-40 mL, Intracatheter, Q12H, Kc, Ramesh, MD, 10 mL at 06/29/20 1127 .  sodium chloride flush (NS) 0.9 % injection 10-40 mL, 10-40 mL, Intracatheter, PRN, Antonieta Pert, MD .  TPN ADULT (ION), , Intravenous, Continuous TPN, Lenis Noon, [facility], Stopped at 06/29/20 1746  . sodium chloride 45 mL/hr at 06/29/20 1747  . TPN ADULT (ION) Stopped (06/29/20 1746)     Vital signs in last 24 hrs: Vitals:   06/29/20 2132 06/30/20 0607  BP: (!) 169/98 (!) 157/91  Pulse: 72 75  Resp: 18 18  Temp: 97.6 F (36.4 C) (!) 97.4 F (36.3 C)  SpO2: 100% 100%    Intake/Output Summary (Last 24 hours) at 06/30/2020 0954 Last data filed at 06/30/2020 0900 Gross per 24 hour  Intake 2267.6 ml  Output 150 ml  Net 2117.6 ml     Physical Exam Guarded and upset affect today.  Her boyfriend is present for the visit.  HEENT: sclera anicteric, oral mucosa without lesions.  NG tube from right nostril with residual bilious  material in it.  Neck: supple, no thyromegaly, JVD or lymphadenopathy  Cardiac: RRR without murmurs, S1S2 heard, no peripheral edema  Pulm: clear to auscultation bilaterally, normal RR and effort noted  Abdomen: soft, no tenderness, with very active bowel sounds. No guarding or palpable hepatosplenomegaly  Skin; warm and dry, no jaundice  Recent Labs:  CBC Latest Ref Rng & Units 06/30/2020 06/28/2020 06/27/2020  WBC 4.0 - 10.5 K/uL 14.8(H) 11.3(H) 12.8(H)  Hemoglobin 12.0 - 15.0 g/dL 12.9 13.3 15.2(H)  Hematocrit 36 -  46 % 40.5 42.1 45.7  Platelets 150 - 400 K/uL 237 318 384    No results for input(s): INR in the last 168 hours. 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 Severe protein calorie malnutrition.  Unfortunately she did not tolerate TPN with a strange reaction.  Generalized abdominal pain, nausea and vomiting, improved almost immediately after NG tube drainage earlier this admission.   Plan: Do not attempt reinstitution of TPN Advance to full liquid diet later today.  Encouraged her to take boost ad lib. Continue current dose IV steroids. If her obstruction opens up more and she is passing any stool, hope to remove the NG tube within a day or 2. Surgery is following.  If she has a relapse, may need surgery this admission.  If she continues on current trajectory and obstruction improves enough to allow adequate oral nutrition, she may be able to avoid surgery, at least during this admission.  I will check on her tomorrow.  Total time 25 minutes (surgical note reviewed, case discussed with [facility] hospitalist)   Nelida Meuse III Office: [phone]

## 2020-06-30 NOTE — Progress Notes (Signed)
MD paged d/t BP. New orders received.

## 2020-06-30 NOTE — Progress Notes (Signed)
MD paged for sleeping med. New order received.

## 2020-06-30 NOTE — Progress Notes (Signed)
PROGRESS NOTE    Cynthia [name]  NWG:[id number] DOB: 1961-08-31 DOA: 06/27/2020 PCP: Denita Lung, MD   Chief Complaint  Patient presents with  . Abdominal Pain    Brief Narrative:  59 y.o. female with medical history significant of Crohn's disease, depression, GERD who presents to the emergency department with complaints of progressively worsening abdominal discomfort.  Patient has Crohn's disease and is followed closely by gastroenterology.  Patient did report some diarrhea 1 day prior to hospital admission  ED Course: In the emergency department, patient underwent abdominal x-ray with findings consistent with bowel obstruction. Repeat CT in the ED with increasing chronic distention of the small bowel in the setting of fistula and stricture of the distal ileum, enteroenteric and enterocolonic fistula edition present, but worsening small bowel obstruction and persistent signs of surrounding inflammation with similar appearance no signs of abscess, GB distended without pericholecystic stranding or wall thickening Given progressive Crohn's disease with obstruction, gastroenterology and general surgery were consulted.  Hospitalist consulted for consideration for medical admission  Patient was admitted seen by GI surgery, being managed conservatively NG tube decompression IV fluids. Placed on IV steroids to help with Crohn's disease, plan is to try NG tube clamping and clear liquid and TPN. After starting the pain patient had pain shooting through the arm to the chest and almost felt like sugar in the brain, TPN was discontinued and patient felt better  Subjective: Seen this morning,son at the bedside.Reports passing "lots of gas",no nausea or vomiting. feels improving overall. Patient reports being upset as she felts she was not attended timely after TPN start when she felt pain in her left arm and short of breath. TPN was stopped by nursing. discussed w/ charge RN to look into it and  shared concern. Son wanting to stay 24h/overnight with the patient-informed nursing to allow if policy allows. Patient has been tolerating clear liquid and Boost all day yesterday.  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  Seems to be improving passing flatus.discussed with Dr. Simona Huh continue plan as per GI with clear liquid diet and to full liquid diet later today, IV steroids.  Hoping to remove NG tube in a day or 2.  If she has a relapse may need surgery this admission.  Severe protein calorie malnutrition, patient had strange reaction to TPN yesterday and has been discontinued.  TPN is not being considered at this time after discussing with GI, continue boost encourage/augment diet.  GERD: Continue PPI.  Hypertension: cont losartan/HCTZ. BP fairly stable.    Hypokalemia -resolved.    Encourage ambulation, incentive spirometry, discussed in detail  DVT prophylaxis: enoxaparin (LOVENOX) injection 40 mg Start: 06/27/20 1800 Code Status:   Code Status: Full Code  Family Communication: plan of care discussed with patient and her son at bedside.  Discussed with GI, nursing staff  Status is: Inpatient  Remains inpatient appropriate because:IV treatments appropriate due to intensity of illness or inability to take PO and Inpatient level of care appropriate due to severity of illness  Dispo: The patient is from: Home              Anticipated d/c is to: Home              Anticipated d/c date is: 3 days              Patient currently is not medically  stable to d/c.   Diet Order            Diet clear liquid Room service appropriate? Yes; Fluid consistency: Thin  Diet effective now                 Body mass index is 28.24 kg/m.  Consultants:see note  Procedures:see note Microbiology:see note Blood Culture    Component Value Date/Time   SDES URINE,  RANDOM 11/14/2015 1642   SPECREQUEST NONE 11/14/2015 1642   CULT MULTIPLE SPECIES PRESENT, SUGGEST RECOLLECTION 11/14/2015 1642   REPTSTATUS 11/16/2015 FINAL 11/14/2015 1642    Other culture-see note  Medications: Scheduled Meds: . Chlorhexidine Gluconate Cloth  6 each Topical Daily  . enoxaparin (LOVENOX) injection  40 mg Subcutaneous Q24H  . feeding supplement  1 Container Oral TID BM  . losartan  50 mg Oral Daily   And  . hydrochlorothiazide  12.5 mg Oral Daily  . insulin aspart  0-9 Units Subcutaneous Q6H  . methylPREDNISolone (SOLU-MEDROL) injection  40 mg Intravenous Q8H  . pantoprazole (PROTONIX) IV  40 mg Intravenous Q12H  . sodium chloride flush  10-40 mL Intracatheter Q12H   Continuous Infusions: . sodium chloride 45 mL/hr at 06/29/20 1747  . TPN ADULT (ION) Stopped (06/29/20 1746)    Antimicrobials: Anti-infectives (From admission, onward)   None       Objective: Vitals: Today's Vitals   06/30/20 0614 06/30/20 0644 06/30/20 0810 06/30/20 0910  BP:      Pulse:      Resp:      Temp:      TempSrc:      SpO2:      Weight:      Height:      PainSc: 9  5  7  5      Intake/Output Summary (Last 24 hours) at 06/30/2020 1027 Last data filed at 06/30/2020 1015 Gross per 24 hour  Intake 2747.6 ml  Output 0 ml  Net 2747.6 ml   Filed Weights   06/27/20 2100 06/29/20 1310 06/30/20 0607  Weight: 74.8 kg 72.5 kg 72.3 kg   Weight change:    Intake/Output from previous day: 08/27 0701 - 08/28 0700 In: 1697.6 [P.O.:450; I.V.:1247.6] Out: 150 [Emesis/NG output:150] Intake/Output this shift: Total I/O In: 1050 [P.O.:750; I.V.:270; NG/GT:30] Out: 0   Examination:  General exam:AAOx3,NAD, weak appearing. ngt+ HEENT:Oral mucosa moist, Ear/Nose WNL grossly, dentition normal. Respiratory system:Bilaterally clear,no wheezing or crackles,no use of accessory muscle Cardiovascular system:S1&S2 +,No JVD. Gastrointestinal system: Abdomen soft, mildly distended bowel  sounds hyperactive, mild tenderness  Nervous System:Alert, awake, moving extremities and grossly nonfocal Extremities:No edema, distal peripheral pulses palpable.  Skin:No rashes,no icterus. JQB:HALPFX muscle bulk,tone, power  Data Reviewed: I have personally reviewed following labs and imaging studies CBC: Recent Labs  Lab 06/27/20 0727 06/28/20 0512 06/30/20 0314  WBC 12.8* 11.3* 14.8*  NEUTROABS  --   --  12.8*  HGB 15.2* 13.3 12.9  HCT 45.7 42.1 40.5  MCV 86.1 92.7 90.2  PLT 384 318 902   Basic Metabolic Panel: Recent Labs  Lab 06/27/20 0727 06/28/20 0512 06/29/20 1013 06/30/20 0314  NA 140 139 139 139  K 2.9* 4.3 3.8 3.6  CL 98 105 103 104  CO2 29 26 24 26   GLUCOSE 149* 87 156* 133*  BUN 12 12 9 9   CREATININE 0.85 0.78 0.65 0.61  CALCIUM 9.2 8.2* 8.9 8.8*  MG 1.9  --  2.1 1.9  PHOS  --   --  3.6 3.3   GFR: Estimated Creatinine Clearance: 71.3 mL/min (by C-G formula based on SCr of 0.61 mg/dL). Liver Function Tests: Recent Labs  Lab 06/27/20 0727 06/28/20 0512 06/30/20 0314  AST 13* 18 11*  ALT 10 10 10   ALKPHOS 61 57 60  BILITOT 0.5 0.9 0.5  PROT 7.1 6.3* 6.1*  ALBUMIN 3.3* 2.8* 2.8*   Recent Labs  Lab 06/27/20 0727  LIPASE 22   No results for input(s): AMMONIA in the last 168 hours. Coagulation Profile: No results for input(s): INR, PROTIME in the last 168 hours. Cardiac Enzymes: No results for input(s): CKTOTAL, CKMB, CKMBINDEX, TROPONINI in the last 168 hours. BNP (last 3 results) No results for input(s): PROBNP in the last 8760 hours. HbA1C: No results for input(s): HGBA1C in the last 72 hours. CBG: Recent Labs  Lab 06/29/20 1715 06/29/20 2336 06/30/20 0601  GLUCAP 251* 112* 110*   Lipid Profile: Recent Labs    06/30/20 0314  TRIG 72   Thyroid Function Tests: No results for input(s): TSH, T4TOTAL, FREET4, T3FREE, THYROIDAB in the last 72 hours. Anemia Panel: No results for input(s): VITAMINB12, FOLATE, FERRITIN, TIBC, IRON,  RETICCTPCT in the last 72 hours. Sepsis Labs: No results for input(s): PROCALCITON, LATICACIDVEN in the last 168 hours.  Recent Results (from the past 240 hour(s))  SARS Coronavirus 2 by RT PCR (hospital order, performed in [facility] hospital lab) Nasopharyngeal Nasopharyngeal Swab     Status: None   Collection Time: 06/27/20 10:56 AM   Specimen: Nasopharyngeal Swab  Result Value Ref Range Status   SARS Coronavirus 2 NEGATIVE NEGATIVE Final    Comment: (NOTE) SARS-CoV-2 target nucleic acids are NOT DETECTED.  The SARS-CoV-2 RNA is generally detectable in upper and lower respiratory specimens during the acute phase of infection. The lowest concentration of SARS-CoV-2 viral copies this assay can detect is 250 copies / mL. A negative result does not preclude SARS-CoV-2 infection and should not be used as the sole basis for treatment or other patient management decisions.  A negative result may occur with improper specimen collection / handling, submission of specimen other than nasopharyngeal swab, presence of viral mutation(s) within the areas targeted by this assay, and inadequate number of viral copies (<250 copies / mL). A negative result must be combined with clinical observations, patient history, and epidemiological information.  Fact Sheet for Patients:   StrictlyIdeas.no  Fact Sheet for Healthcare Providers: BankingDealers.co.za  This test is not yet approved or  cleared by the [place] FDA and has been authorized for detection and/or diagnosis of SARS-CoV-2 by FDA under an Emergency Use Authorization (EUA).  This EUA will remain in effect (meaning this test can be used) for the duration of the COVID-19 declaration under Section 564(b)(1) of the Act, 21 U.S.C. section 360bbb-3(b)(1), unless the authorization is terminated or revoked sooner.  Performed at [facility], [place] [address], [place], [place] [address]       Radiology Studies: [place] EKG SITE RITE  Result Date: 06/28/2020 If Site Rite image not attached, placement could not be confirmed due to current cardiac rhythm.    LOS: 3 days   Antonieta Pert, MD [facility]  06/30/2020, 10:27 AM

## 2020-07-01 LAB — COMPREHENSIVE METABOLIC PANEL
ALT: 10 U/L (ref 0–44)
AST: 9 U/L — ABNORMAL LOW (ref 15–41)
Albumin: 2.8 g/dL — ABNORMAL LOW (ref 3.5–5.0)
Alkaline Phosphatase: 55 U/L (ref 38–126)
Anion gap: 8 (ref 5–15)
BUN: 7 mg/dL (ref 6–20)
CO2: 26 mmol/L (ref 22–32)
Calcium: 8.8 mg/dL — ABNORMAL LOW (ref 8.9–10.3)
Chloride: 106 mmol/L (ref 98–111)
Creatinine, Ser: 0.6 mg/dL (ref 0.44–1.00)
GFR calc Af Amer: >60 mL/min (ref >60)
GFR calc non Af Amer: >60 mL/min (ref >60)
Glucose, Bld: 143 mg/dL — ABNORMAL HIGH (ref 70–99)
Potassium: 3.7 mmol/L (ref 3.5–5.1)
Sodium: 140 mmol/L (ref 135–145)
Total Bilirubin: 0.2 mg/dL — ABNORMAL LOW (ref 0.3–1.2)
Total Protein: 6.5 g/dL (ref 6.5–8.1)

## 2020-07-01 LAB — CBC WITH DIFFERENTIAL/PLATELET
Abs Immature Granulocytes: 0.12 10*3/uL — ABNORMAL HIGH (ref 0.00–0.07)
Basophils Absolute: 0 10*3/uL (ref 0.0–0.1)
Basophils Relative: 0 %
Eosinophils Absolute: 0 10*3/uL (ref 0.0–0.5)
Eosinophils Relative: 0 %
HCT: 43.2 % (ref 36.0–46.0)
Hemoglobin: 14 g/dL (ref 12.0–15.0)
Immature Granulocytes: 1 %
Lymphocytes Relative: 10 %
Lymphs Abs: 1.1 10*3/uL (ref 0.7–4.0)
MCH: 28.8 pg (ref 26.0–34.0)
MCHC: 32.4 g/dL (ref 30.0–36.0)
MCV: 88.9 fL (ref 80.0–100.0)
Monocytes Absolute: 0.4 10*3/uL (ref 0.1–1.0)
Monocytes Relative: 4 %
Neutro Abs: 10 10*3/uL — ABNORMAL HIGH (ref 1.7–7.7)
Neutrophils Relative %: 85 %
Platelets: 329 10*3/uL (ref 150–400)
RBC: 4.86 MIL/uL (ref 3.87–5.11)
RDW: 13.9 % (ref 11.5–15.5)
WBC: 11.7 10*3/uL — ABNORMAL HIGH (ref 4.0–10.5)
nRBC: 0 % (ref 0.0–0.2)

## 2020-07-01 MED ORDER — METHYLPREDNISOLONE SODIUM SUCC 40 MG IJ SOLR
20.0000 mg | Freq: Three times a day (TID) | INTRAMUSCULAR | Status: DC
  Administered 2020-07-01 – 2020-07-02 (×3): 20 mg via INTRAVENOUS
  Filled 2020-07-01 (×3): qty 1

## 2020-07-01 MED ORDER — PANTOPRAZOLE SODIUM 40 MG PO TBEC
40.0000 mg | DELAYED_RELEASE_TABLET | Freq: Two times a day (BID) | ORAL | Status: DC
  Administered 2020-07-01 – 2020-07-03 (×4): 40 mg via ORAL
  Filled 2020-07-01 (×4): qty 1

## 2020-07-01 MED ORDER — HYDROCHLOROTHIAZIDE 25 MG PO TABS
25.0000 mg | ORAL_TABLET | Freq: Every day | ORAL | Status: DC
  Administered 2020-07-01: 25 mg via ORAL
  Filled 2020-07-01: qty 1

## 2020-07-01 MED ORDER — MAGIC MOUTHWASH
15.0000 mL | Freq: Four times a day (QID) | ORAL | Status: DC | PRN
  Administered 2020-07-01 – 2020-07-02 (×2): 15 mL via ORAL
  Filled 2020-07-01 (×5): qty 15

## 2020-07-01 MED ORDER — LOSARTAN POTASSIUM 50 MG PO TABS
50.0000 mg | ORAL_TABLET | Freq: Every day | ORAL | Status: DC
  Administered 2020-07-01: 50 mg via ORAL
  Filled 2020-07-01: qty 1

## 2020-07-01 MED ORDER — ENOXAPARIN SODIUM 40 MG/0.4ML ~~LOC~~ SOLN
40.0000 mg | SUBCUTANEOUS | Status: DC
  Administered 2020-07-01 – 2020-07-02 (×2): 40 mg via SUBCUTANEOUS
  Filled 2020-07-01 (×2): qty 0.4

## 2020-07-01 NOTE — Progress Notes (Signed)

## 2020-07-01 NOTE — Progress Notes (Addendum)
CCS paged regarding the pt's clo throat pain, in tears while trying to eat soft food. Pt requested for NGT to be removed d/t the pain. Pt also had a BM today. Cynthia [name] returned paged and gave verbal orders to remove NGT. If pt becomes nauseous/vomits, place NPO until tomorrow morning per MD. Verbal orders given for 15 ml of magic mouth wash PO PRN q6 for sore throat.  NGT removed, pt tolerated well.

## 2020-07-01 NOTE — Progress Notes (Signed)
PROGRESS NOTE    Cynthia [name]  OZY:[id number] DOB: Nov 08, 1960 DOA: 06/27/2020 PCP: Denita Lung, MD   Chief Complaint  Patient presents with  . Abdominal Pain    Brief Narrative:  59 y.o. female with medical history significant of Crohn's disease, depression, GERD who presents to the emergency department with complaints of progressively worsening abdominal discomfort.  Patient has Crohn's disease and is followed closely by gastroenterology.  Patient did report some diarrhea 1 day prior to hospital admission  ED Course: In the emergency department, patient underwent abdominal x-ray with findings consistent with bowel obstruction. Repeat CT in the ED with increasing chronic distention of the small bowel in the setting of fistula and stricture of the distal ileum, enteroenteric and enterocolonic fistula edition present, but worsening small bowel obstruction and persistent signs of surrounding inflammation with similar appearance no signs of abscess, GB distended without pericholecystic stranding or wall thickening Given progressive Crohn's disease with obstruction, gastroenterology and general surgery were consulted.  Hospitalist consulted for consideration for medical admission  Patient was admitted seen by GI surgery, being managed conservatively NG tube decompression IV fluids. Placed on IV steroids to help with Crohn's disease, plan is to try NG tube clamping and clear liquid and TPN. After starting the pain patient had pain shooting through the arm to the chest and almost felt like sugar in the brain, TPN was discontinued and patient felt better  Subjective:  Overnight blood pressure high in the 170s, received Ambien for sleep.  Remains afebrile. Patient is on full liquid diet tolerating well, passing flatus. 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 NG  tube is clamped.On FLD-discussed with GI and plan for soft diet for supper and keep ngt claped in place incase need to be back on suction. abd is soft.Cont IV steroids per gi watch sugar. Discussed w/ CCS.  Severe protein calorie malnutrition, patient had strange reaction to TPN 8/28-has been discontinued.  TPN is not being considered at this time after discussing with GI, continue boost encourage/augment diet. Dietitian is on consult.  Hyperglycemia: 2/2 Steroid a nd IV steroid is being decreased.  GERD: Continue PT.  Hypertension: BP poorly controlled.  Cont home losartan 50, increase  homeHCTZ  From 82m to 25 mg. Cont prn hydralazine.  Monitor and adjust meds.  Hypokalemia: resolved  Encourage ambulation, incentive spirometry, discussed in detail  DVT prophylaxis: enoxaparin (LOVENOX) injection 40 mg Start: 07/01/20 1200 Code Status:   Code Status: Full Code  Family Communication: plan of care discussed with patient and her caregiver at bedside.  Discussed with GI, nursing staff  Status is: Inpatient  Remains inpatient appropriate because:IV treatments appropriate due to intensity of illness or inability to take PO and Inpatient level of care appropriate due to severity of illness remains hospitalized for ongoing iv steroid.   Dispo: The patient is from: Home              Anticipated d/c is to: Home              Anticipated d/c date is: 2 days              Patient currently is not medically stable to d/c.  Diet Order            DIET SOFT Room service appropriate? Yes; Fluid consistency: Thin  Diet effective now  Diet full liquid Room service appropriate? Yes; Fluid consistency: Thin  Diet effective now                 Body mass index is 28.24 kg/m. Consultants:see note  Procedures:see note Microbiology:see note Blood Culture    Component Value Date/Time   SDES URINE, RANDOM 11/14/2015 1642   SPECREQUEST NONE 11/14/2015 1642   CULT MULTIPLE SPECIES  PRESENT, SUGGEST RECOLLECTION 11/14/2015 1642   REPTSTATUS 11/16/2015 FINAL 11/14/2015 1642    Other culture-see note  Medications: Scheduled Meds: . Chlorhexidine Gluconate Cloth  6 each Topical Daily  . enoxaparin (LOVENOX) injection  40 mg Subcutaneous Q24H  . feeding supplement  1 Container Oral TID BM  . hydrochlorothiazide  25 mg Oral Daily   And  . losartan  50 mg Oral Daily  . methylPREDNISolone (SOLU-MEDROL) injection  20 mg Intravenous Q8H  . pantoprazole (PROTONIX) IV  40 mg Intravenous Q12H  . sodium chloride flush  10-40 mL Intracatheter Q12H   Continuous Infusions: . sodium chloride 45 mL/hr at 06/30/20 1659    Antimicrobials: Anti-infectives (From admission, onward)   None     Objective: Vitals: Today's Vitals   07/01/20 0606 07/01/20 0607 07/01/20 0835 07/01/20 1029  BP:  (!) 169/87  (!) 161/107  Pulse:  67  95  Resp:  14    Temp:  98.4 F (36.9 C)    TempSrc:  Oral    SpO2:  100%    Weight:      Height:      PainSc: 9   Asleep     Intake/Output Summary (Last 24 hours) at 07/01/2020 1034 Last data filed at 07/01/2020 0916 Gross per 24 hour  Intake 1660 ml  Output 0 ml  Net 1660 ml   Filed Weights   06/27/20 2100 06/29/20 1310 06/30/20 0607  Weight: 74.8 kg 72.5 kg 72.3 kg   Weight change:    Intake/Output from previous day: 08/28 0701 - 08/29 0700 In: 2590 [P.O.:1450; I.V.:1080; NG/GT:60] Out: 0  Intake/Output this shift: Total I/O In: 120 [P.O.:120] Out: 0   Examination:  General exam: AAx3O , NAD, weak appearing.  NG tube present and is clamped. HEENT:Oral mucosa moist, Ear/Nose WNL grossly, dentition normal. Respiratory system: bilaterally clear,no wheezing or crackles,no use of accessory muscle Cardiovascular system: S1 & S2 +, No JVD,. Gastrointestinal system: Abdomen soft, NT,ND, BS+ Nervous System:Alert, awake, moving extremities and grossly nonfocal Extremities: No edema, distal peripheral pulses palpable.  Skin: No  rashes,no icterus. MSK: Normal muscle bulk,tone, power. Data Reviewed: I have personally reviewed following labs and imaging studies CBC: Recent Labs  Lab 06/27/20 0727 06/28/20 0512 06/30/20 0314  WBC 12.8* 11.3* 14.8*  NEUTROABS  --   --  12.8*  HGB 15.2* 13.3 12.9  HCT 45.7 42.1 40.5  MCV 86.1 92.7 90.2  PLT 384 318 765   Basic Metabolic Panel: Recent Labs  Lab 06/27/20 0727 06/28/20 0512 06/29/20 1013 06/30/20 0314 07/01/20 0843  NA 140 139 139 139 140  K 2.9* 4.3 3.8 3.6 3.7  CL 98 105 103 104 106  CO2 29 26 24 26 26   GLUCOSE 149* 87 156* 133* 143*  BUN 12 12 9 9 7   CREATININE 0.85 0.78 0.65 0.61 0.60  CALCIUM 9.2 8.2* 8.9 8.8* 8.8*  MG 1.9  --  2.1 1.9  --   PHOS  --   --  3.6 3.3  --    GFR: Estimated Creatinine Clearance: 71.3 mL/min (by C-G  formula based on SCr of 0.6 mg/dL). Liver Function Tests: Recent Labs  Lab 06/27/20 0727 06/28/20 0512 06/30/20 0314 07/01/20 0843  AST 13* 18 11* 9*  ALT 10 10 10 10   ALKPHOS 61 57 60 55  BILITOT 0.5 0.9 0.5 0.2*  PROT 7.1 6.3* 6.1* 6.5  ALBUMIN 3.3* 2.8* 2.8* 2.8*   Recent Labs  Lab 06/27/20 0727  LIPASE 22   No results for input(s): AMMONIA in the last 168 hours. Coagulation Profile: No results for input(s): INR, PROTIME in the last 168 hours. Cardiac Enzymes: No results for input(s): CKTOTAL, CKMB, CKMBINDEX, TROPONINI in the last 168 hours. BNP (last 3 results) No results for input(s): PROBNP in the last 8760 hours. HbA1C: No results for input(s): HGBA1C in the last 72 hours. CBG: Recent Labs  Lab 06/29/20 1715 06/29/20 2336 06/30/20 0601 06/30/20 1136 06/30/20 1737  GLUCAP 251* 112* 110* 179* 217*   Lipid Profile: Recent Labs    06/30/20 0314  TRIG 72   Thyroid Function Tests: No results for input(s): TSH, T4TOTAL, FREET4, T3FREE, THYROIDAB in the last 72 hours. Anemia Panel: No results for input(s): VITAMINB12, FOLATE, FERRITIN, TIBC, IRON, RETICCTPCT in the last 72 hours. Sepsis  Labs: No results for input(s): PROCALCITON, LATICACIDVEN in the last 168 hours.  Recent Results (from the past 240 hour(s))  SARS Coronavirus 2 by RT PCR (hospital order, performed in [facility] hospital lab) Nasopharyngeal Nasopharyngeal Swab     Status: None   Collection Time: 06/27/20 10:56 AM   Specimen: Nasopharyngeal Swab  Result Value Ref Range Status   SARS Coronavirus 2 NEGATIVE NEGATIVE Final    Comment: (NOTE) SARS-CoV-2 target nucleic acids are NOT DETECTED.  The SARS-CoV-2 RNA is generally detectable in upper and lower respiratory specimens during the acute phase of infection. The lowest concentration of SARS-CoV-2 viral copies this assay can detect is 250 copies / mL. A negative result does not preclude SARS-CoV-2 infection and should not be used as the sole basis for treatment or other patient management decisions.  A negative result may occur with improper specimen collection / handling, submission of specimen other than nasopharyngeal swab, presence of viral mutation(s) within the areas targeted by this assay, and inadequate number of viral copies (<250 copies / mL). A negative result must be combined with clinical observations, patient history, and epidemiological information.  Fact Sheet for Patients:   StrictlyIdeas.no  Fact Sheet for Healthcare Providers: BankingDealers.co.za  This test is not yet approved or  cleared by the [place] FDA and has been authorized for detection and/or diagnosis of SARS-CoV-2 by FDA under an Emergency Use Authorization (EUA).  This EUA will remain in effect (meaning this test can be used) for the duration of the COVID-19 declaration under Section 564(b)(1) of the Act, 21 U.S.C. section 360bbb-3(b)(1), unless the authorization is terminated or revoked sooner.  Performed at [facility], [employer] [address] [place], [place] [address]       Radiology  Studies: No results found.   LOS: 4 days   Antonieta Pert, MD [facility]  07/01/2020, 10:34 AM

## 2020-07-01 NOTE — Progress Notes (Signed)
   Subjective/Chief Complaint: Feels much better. Only complains of pain in throat from ng   Objective: Vital signs in last 24 hours: Temp:  [97.5 F (36.4 C)-98.4 F (36.9 C)] 98.4 F (36.9 C) (08/29 0607) Pulse Rate:  [67-83] 67 (08/29 0607) Resp:  [14-16] 14 (08/29 0607) BP: (163-174)/(87-92) 169/87 (08/29 0607) SpO2:  [98 %-100 %] 100 % (08/29 0607) Last BM Date: 06/28/20  Intake/Output from previous day: 08/28 0701 - 08/29 0700 In: 2590 [P.O.:1450; I.V.:1080; NG/GT:60] Out: 0  Intake/Output this shift: Total I/O In: 120 [P.O.:120] Out: 0   General appearance: alert and cooperative Resp: clear to auscultation bilaterally Cardio: regular rate and rhythm GI: soft, nontender. not distended.   Lab Results:  Recent Labs    06/30/20 0314  WBC 14.8*  HGB 12.9  HCT 40.5  PLT 237   BMET Recent Labs    06/30/20 0314 07/01/20 0843  NA 139 140  K 3.6 3.7  CL 104 106  CO2 26 26  GLUCOSE 133* 143*  BUN 9 7  CREATININE 0.61 0.60  CALCIUM 8.8* 8.8*   PT/INR No results for input(s): LABPROT, INR in the last 72 hours. ABG No results for input(s): PHART, HCO3 in the last 72 hours.  Invalid input(s): PCO2, PO2  Studies/Results: No results found.  Anti-infectives: Anti-infectives (From admission, onward)   None      Assessment/Plan: s/p * No surgery found * Advance diet. Continue fulls and try soft this evening HTN GERD RLS Depression/Anxiety  Crohn's ileitis with SBO and fistulas - outpatientCT showed 8/11 showed high-grade sbo, possible E-E fistulas and fistulous connection to sigmoid colon -Pt doing well. tolerating diet. Hopefully get ng out later today -Currently on steroids per GI  LOS: 4 days    Autumn Messing III 07/01/2020

## 2020-07-01 NOTE — Progress Notes (Signed)
Pt had a small BM, type 5.

## 2020-07-01 NOTE — Progress Notes (Signed)
[facility] Progress Note  Chief Complaint: Ileal Crohn's disease with small bowel obstruction  History: Has tolerated a full liquid diet since last evening with no abdominal pain, nausea or vomiting.  Intermittent belching.  Passing gas but no liquid or solid stool yet. NG tube is irritating. Has been frequently ambulating in halls during hospital stay.  ROS: Cardiovascular: Denies chest pain Respiratory: Denies dyspnea Urinary: Denies dysuria  Objective:   Current Facility-Administered Medications:  .  0.9 %  sodium chloride infusion, , Intravenous, Continuous, Lenis Noon, [facility], Last Rate: 45 mL/hr at 06/30/20 1659, Rate Verify at 06/30/20 1659 .  acetaminophen (TYLENOL) tablet 650 mg, 650 mg, Oral, Q6H PRN, 650 mg at 06/30/20 2220 **OR** acetaminophen (TYLENOL) suppository 650 mg, 650 mg, Rectal, Q6H PRN, Donne Hazel, MD .  Chlorhexidine Gluconate Cloth 2 % PADS 6 each, 6 each, Topical, Daily, Antonieta Pert, MD, 6 each at 06/30/20 1209 .  enoxaparin (LOVENOX) injection 40 mg, 40 mg, Subcutaneous, Q24H, Kc, Ramesh, MD .  feeding supplement (BOOST / RESOURCE BREEZE) liquid 1 Container, 1 Container, Oral, TID BM, Kc, Ramesh, MD, 1 Container at 06/30/20 1537 .  hydrALAZINE (APRESOLINE) injection 10 mg, 10 mg, Intravenous, Q6H PRN, Sharion Settler, NP, 10 mg at 06/30/20 2222 .  losartan (COZAAR) tablet 50 mg, 50 mg, Oral, Daily **AND** hydrochlorothiazide (HYDRODIURIL) tablet 25 mg, 25 mg, Oral, Daily, Kc, Ramesh, MD .  methylPREDNISolone sodium succinate (SOLU-MEDROL) 40 mg/mL injection 40 mg, 40 mg, Intravenous, Q8H, Danis, Estill Cotta III, MD, 40 mg at 07/01/20 0605 .  morphine 2 MG/ML injection 2 mg, 2 mg, Intravenous, Q2H PRN, Donne Hazel, MD, 2 mg at 07/01/20 0606 .  ondansetron (ZOFRAN) injection 4 mg, 4 mg, Intravenous, Q6H PRN, Donne Hazel, MD, 4 mg at 06/28/20 0403 .  pantoprazole (PROTONIX) injection 40 mg, 40 mg, Intravenous, Q12H, Norm Parcel, PA-C, 40 mg at  06/30/20 2225 .  phenol (CHLORASEPTIC) mouth spray 1 spray, 1 spray, Mouth/Throat, PRN, Donne Hazel, MD, 1 spray at 06/27/20 2130 .  sodium chloride flush (NS) 0.9 % injection 10-40 mL, 10-40 mL, Intracatheter, Q12H, Kc, Ramesh, MD, 10 mL at 06/29/20 1127 .  sodium chloride flush (NS) 0.9 % injection 10-40 mL, 10-40 mL, Intracatheter, PRN, Kc, Ramesh, MD .  zolpidem (AMBIEN) tablet 5 mg, 5 mg, Oral, QHS PRN, Sharion Settler, NP, 5 mg at 06/30/20 2220  . sodium chloride 45 mL/hr at 06/30/20 1659     Vital signs in last 24 hrs: Vitals:   06/30/20 2313 07/01/20 0607  BP: (!) 163/91 (!) 169/87  Pulse: 83 67  Resp:  14  Temp:  98.4 F (36.9 C)  SpO2:  100%    Intake/Output Summary (Last 24 hours) at 07/01/2020 1062 Last data filed at 07/01/2020 6948 Gross per 24 hour  Intake 2140 ml  Output 0 ml  Net 2140 ml     Physical Exam Her boyfriend was present for the entire visit. Judeen Hammans is sitting in a bedside chair watching a movie having a sherbet.  HEENT: sclera anicteric, oral mucosa without lesions.  NG tube from right nostril, clamped.  Neck: supple, no thyromegaly, JVD or lymphadenopathy  Cardiac: RRR without murmurs, S1S2 heard, no peripheral edema  Pulm: clear to auscultation bilaterally, normal RR and effort noted  Abdomen: soft, no tenderness, with active bowel sounds. No guarding or palpable hepatosplenomegaly  Skin; warm and dry, no jaundice  Recent Labs:  CBC Latest Ref Rng & Units 06/30/2020 06/28/2020  06/27/2020  WBC 4.0 - 10.5 K/uL 14.8(H) 11.3(H) 12.8(H)  Hemoglobin 12.0 - 15.0 g/dL 12.9 13.3 15.2(H)  Hematocrit 36 - 46 % 40.5 42.1 45.7  Platelets 150 - 400 K/uL 237 318 384    No results for input(s): INR in the last 168 hours. 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  Significantly improved with NG tube decompression, has been clamped over 48 hours.  Tolerating full liquid diet over 12 hours.    Plan: Decrease steroids to 20 mg Solu-Medrol IV every 8 hours because her glucose has been running high, and the obstruction appears to be slowly improving. Advance to soft diet with supper this evening.  If tolerated, most likely remove NG tube tomorrow. Surgical consultant has been following. Dr. Fuller Plan to start on consult service tomorrow, and he knows this patient well.  25 minutes total time (Discussed case with Dr. Lupita Leash)  Nelida Meuse III Office: [phone]

## 2020-07-02 DIAGNOSIS — K50013 Crohn's disease of small intestine with fistula: Principal | ICD-10-CM

## 2020-07-02 LAB — CBC
HCT: 40.4 % (ref 36.0–46.0)
Hemoglobin: 13.1 g/dL (ref 12.0–15.0)
MCH: 29 pg (ref 26.0–34.0)
MCHC: 32.4 g/dL (ref 30.0–36.0)
MCV: 89.4 fL (ref 80.0–100.0)
Platelets: 267 10*3/uL (ref 150–400)
RBC: 4.52 MIL/uL (ref 3.87–5.11)
RDW: 13.7 % (ref 11.5–15.5)
WBC: 12.1 10*3/uL — ABNORMAL HIGH (ref 4.0–10.5)
nRBC: 0 % (ref 0.0–0.2)

## 2020-07-02 LAB — BASIC METABOLIC PANEL
Anion gap: 7 (ref 5–15)
BUN: 12 mg/dL (ref 6–20)
CO2: 27 mmol/L (ref 22–32)
Calcium: 9 mg/dL (ref 8.9–10.3)
Chloride: 103 mmol/L (ref 98–111)
Creatinine, Ser: 0.74 mg/dL (ref 0.44–1.00)
GFR calc Af Amer: >60 mL/min (ref >60)
GFR calc non Af Amer: >60 mL/min (ref >60)
Glucose, Bld: 125 mg/dL — ABNORMAL HIGH (ref 70–99)
Potassium: 4 mmol/L (ref 3.5–5.1)
Sodium: 137 mmol/L (ref 135–145)

## 2020-07-02 MED ORDER — LOSARTAN POTASSIUM 50 MG PO TABS
100.0000 mg | ORAL_TABLET | Freq: Every day | ORAL | Status: DC
  Administered 2020-07-02 – 2020-07-03 (×2): 100 mg via ORAL
  Filled 2020-07-02 (×2): qty 2

## 2020-07-02 MED ORDER — PRAMIPEXOLE DIHYDROCHLORIDE 0.25 MG PO TABS
1.0000 mg | ORAL_TABLET | Freq: Every day | ORAL | Status: DC
  Administered 2020-07-02 – 2020-07-03 (×2): 1 mg via ORAL
  Filled 2020-07-02 (×2): qty 4

## 2020-07-02 MED ORDER — FLUCONAZOLE 150 MG PO TABS
150.0000 mg | ORAL_TABLET | Freq: Once | ORAL | Status: AC
  Administered 2020-07-02: 150 mg via ORAL
  Filled 2020-07-02: qty 1

## 2020-07-02 MED ORDER — HYDROCHLOROTHIAZIDE 25 MG PO TABS
25.0000 mg | ORAL_TABLET | Freq: Every day | ORAL | Status: DC
  Administered 2020-07-02 – 2020-07-03 (×2): 25 mg via ORAL
  Filled 2020-07-02 (×2): qty 1

## 2020-07-02 MED ORDER — METHYLPREDNISOLONE SODIUM SUCC 40 MG IJ SOLR
20.0000 mg | Freq: Two times a day (BID) | INTRAMUSCULAR | Status: DC
  Administered 2020-07-02 – 2020-07-03 (×2): 20 mg via INTRAVENOUS
  Filled 2020-07-02 (×2): qty 1

## 2020-07-02 MED ORDER — VENLAFAXINE HCL ER 150 MG PO CP24
300.0000 mg | ORAL_CAPSULE | Freq: Every day | ORAL | Status: DC
  Administered 2020-07-02 – 2020-07-03 (×2): 300 mg via ORAL
  Filled 2020-07-02 (×2): qty 2

## 2020-07-02 NOTE — Progress Notes (Signed)
[facility] Progress Note     Subjective: Patient reports she is feeling much better. She is tolerating soft diet and having bowel function. She denies abdominal pain or bloating. She had NGT removed overnight and has done fine since this. She is very hopeful that she can avoid surgery for as long as possible. She asks about restless leg medication and reports that she feels like she is getting a yeast infection.   Objective: Vital signs in last 24 hours: Temp:  [97.6 F (36.4 C)-98.3 F (36.8 C)] 97.6 F (36.4 C) (08/30 0532) Pulse Rate:  [79-95] 79 (08/30 0532) Resp:  [17-22] 20 (08/30 0641) BP: (152-168)/(92-111) 168/104 (08/30 0641) SpO2:  [100 %] 100 % (08/30 0532) Last BM Date: 07/01/20  Intake/Output from previous day: 08/29 0701 - 08/30 0700 In: 2100 [P.O.:840; I.V.:1260] Out: 0  Intake/Output this shift: No intake/output data recorded.  PE: General: pleasant, WD, WN female who is sitting up in NAD Heart: regular, rate, and rhythm.  Normal s1,s2. No obvious murmurs, gallops, or rubs noted.  Palpable radial and pedal pulses bilaterally Lungs: CTAB, no wheezes, rhonchi, or rales noted.  Respiratory effort nonlabored Abd: soft, NT, ND, +BS MS: all 4 extremities are symmetrical with no cyanosis, clubbing, or edema. Skin: warm and dry with no masses, lesions, or rashes Neuro: Cranial nerves 2-12 grossly intact, sensation grossly intact Psych: A&Ox3 with an appropriate affect.   Lab Results:  Recent Labs    07/01/20 1148 07/02/20 0319  WBC 11.7* 12.1*  HGB 14.0 13.1  HCT 43.2 40.4  PLT 329 267   BMET Recent Labs    07/01/20 0843 07/02/20 0319  NA 140 137  K 3.7 4.0  CL 106 103  CO2 26 27  GLUCOSE 143* 125*  BUN 7 12  CREATININE 0.60 0.74  CALCIUM 8.8* 9.0   PT/INR No results for input(s): LABPROT, INR in the last 72 hours. CMP     Component Value Date/Time   NA 137 07/02/2020 0319   K 4.0 07/02/2020 0319   CL 103 07/02/2020 0319    CO2 27 07/02/2020 0319   GLUCOSE 125 (H) 07/02/2020 0319   BUN 12 07/02/2020 0319   CREATININE 0.74 07/02/2020 0319   CREATININE 0.94 05/08/2016 0741   CALCIUM 9.0 07/02/2020 0319   PROT 6.5 07/01/2020 0843   ALBUMIN 2.8 (L) 07/01/2020 0843   AST 9 (L) 07/01/2020 0843   ALT 10 07/01/2020 0843   ALKPHOS 55 07/01/2020 0843   BILITOT 0.2 (L) 07/01/2020 0843   GFRNONAA >60 07/02/2020 0319   GFRAA >60 07/02/2020 0319   Lipase     Component Value Date/Time   LIPASE 22 06/27/2020 0727       Studies/Results: No results found.  Anti-infectives: Anti-infectives (From admission, onward)   Start     Dose/Rate Route Frequency Ordered Stop   07/02/20 0800  fluconazole (DIFLUCAN) tablet 150 mg        150 mg Oral  Once 07/02/20 0758         Assessment/Plan HTN GERD RLS Depression/Anxiety  Crohn's ileitis with SBO and fistulas - outpatient CT showed 8/11 showed high-grade sbo, E-E fistulas and fistulous connection to sigmoid colon - CT 8/25 showed increased chronic distention and worsening psbo related to fistulas - steroids started per GI - patient now with NGT out, tolerating soft diet and having bowel function - await GI recs today but I do not feel like this patient seems to need any urgent surgical intervention  at this time - if GI agrees patient may be able to go home on PO steroids with close GI follow up and I am happy to arrange colorectal follow up if needed or she can be referred by GI as needed   FEN: soft diet  VTE: SCDs, lovenox ID: PO fluconazole x1  LOS: 5 days    Norm Parcel , [facility] 07/02/2020, 8:13 AM Please see Amion for pager number during day hours 7:00am-4:30pm

## 2020-07-02 NOTE — Progress Notes (Signed)
Nutrition Follow-up  INTERVENTION:   -Boost Breeze po TID, each supplement provides 250 kcal and 9 grams of protein -Provide "IBD Nutrition Therapy" handout from [employer]  NUTRITION DIAGNOSIS:   Increased nutrient needs related to  (Crohn's ileitis) as evidenced by estimated needs.  Ongoing.  GOAL:   Patient will meet greater than or equal to 90% of their needs  Progressing.  MONITOR:   PO intake, Supplement acceptance, Labs, Weight trends, I & O's  REASON FOR ASSESSMENT:   Consult Diet education  ASSESSMENT:   59 y.o. female with medical history significant of Crohn's disease, depression, GERD who presents to the emergency department with complaints of progressively worsening abdominal discomfort.  Patient has Crohn's disease and is followed closely by gastroenterology.  Patient did report some diarrhea 1 day prior to hospital admission  8/26: PICC line placed 8/27: TPN initiated, CLD started 8/28: FLD 8/29: NGT removed, Soft diet   Patient provided with "IBD Nutrition Therapy" handout. Reviewed low fiber and low fat options with patient. Answered all of pt's questions. Pt appreciative for handout.   Pt is currently consuming 25-100% of meals. States she is feeling better. Pt is accepting at least 1 Boost Breeze daily.   Admission weight: 165 lbs. Last recorded weight 8/28: 159 lbs.  Medications reviewed. Labs reviewed: CBGs: 179-217  Diet Order:   Diet Order            DIET SOFT Room service appropriate? Yes; Fluid consistency: Thin  Diet effective now                 EDUCATION NEEDS:   Education needs have been addressed  Skin:  Skin Assessment: Reviewed RN Assessment  Last BM:  8/29 -type 5  Height:   Ht Readings from Last 1 Encounters:  06/27/20 5' 3"  (1.6 m)    Weight:   Wt Readings from Last 1 Encounters:  06/30/20 72.3 kg   BMI:  Body mass index is 28.24 kg/m.  Estimated Nutritional Needs:   Kcal:   1800-2000  Protein:  70-85g  Fluid:  2L/day  Clayton Bibles, MS, RD, LDN Inpatient Clinical Dietitian Contact information available via Amion

## 2020-07-02 NOTE — Plan of Care (Signed)

## 2020-07-02 NOTE — Progress Notes (Addendum)
[facility] Progress Note  CC:  Ileal Crohn's disease with SBO  Subjective: She did not sleep well last night due to restless legs. NGT was dc'd. She is tolerating a soft diet. No N/V. No abdominal pain. She passed 3 soft formed brown BMs yesterday and one loose stool this morning. No rectal bleeding. No other complaints.    Objective:   Abd/pelvic CT with contrast 06/27/2020: 1. 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 Worsening partial small bowel obstruction associated with above findings. 2. Persistent signs of surrounding inflammation with similar appearance. 3. No signs of abscess. 4. Gallbladder distension without pericholecystic stranding or wall thickening. Correlate with any RIGHT upper quadrant symptoms or laboratory abnormalities as warranted. 5. Aortic atherosclerosis. Aortic Atherosclerosis    Vital signs in last 24 hours: Temp:  [97.6 F (36.4 C)-98.3 F (36.8 C)] 97.6 F (36.4 C) (08/30 0532) Pulse Rate:  [79-92] 79 (08/30 0532) Resp:  [17-22] 20 (08/30 0641) BP: (152-168)/(92-111) 168/104 (08/30 0641) SpO2:  [100 %] 100 % (08/30 0532) Last BM Date: 07/01/20   General:   Alert 59 year old female in NAD.  Heart: RRR, no murmur.  Pulm:  Breath sounds clear throughout.  Abdomen: Soft, nontender. Nondistended. + BS x 4 quads. No mass.  Extremities:  Without edema. Neurologic:  Alert and  oriented x4;  grossly normal neurologically. Psych:  Alert and cooperative. Normal mood and affect.  Intake/Output from previous day: 08/29 0701 - 08/30 0700 In: 2100 [P.O.:840; I.V.:1260] Out: 0  Intake/Output this shift: Total I/O In: 420 [P.O.:240; I.V.:180] Out: 0   Lab Results: Recent Labs    06/30/20 0314 07/01/20 1148 07/02/20 0319  WBC 14.8* 11.7* 12.1*  HGB 12.9 14.0 13.1  HCT 40.5 43.2 40.4  PLT 237  329 267   BMET Recent Labs    06/30/20 0314 07/01/20 0843 07/02/20 0319  NA 139 140 137  K 3.6 3.7 4.0  CL 104 106 103  CO2 26 26 27   GLUCOSE 133* 143* 125*  BUN 9 7 12   CREATININE 0.61 0.60 0.74  CALCIUM 8.8* 8.8* 9.0   LFT Recent Labs    07/01/20 0843  PROT 6.5  ALBUMIN 2.8*  AST 9*  ALT 10  ALKPHOS 55  BILITOT 0.2*    Assessment / Plan:  30. 59 year old female with ileal Crohn's disease with enteroenteric and enterocolonic fistulae with SBO. Her SBO has significantly improved over the past 24 hours. NGT was discontinued. She is tolerating a soft diet. She passed 3 normal BMs yesterday and one looser stool this am. Surgery deferred for now.  -Soft low residue diet -Reduce Solumedrol 44m IV Q 12 hours. She will require transition to Prednisone with taper instructions at time of discharge  -Humira drug and antibody level  -Humira discontinued -Plan to start either Remicade or Stelera induction in the near future as discussed with Dr. SFuller Plan If Humira antibodies positive Stelara will most likely be initiated.  -Consider referral to tertiary center -Continue Pantoprazole 453mpo bid   2. Leukocytosis, secondary to Solumedrol. She is afebrile.    Principal Problem:   Crohn's ileitis, with intestinal obstruction ([facility] Problems:   GERD   Hypertension   Obesity (BMI 30-39.9)   Crohn disease ([place]   LOS: 5 days   CoNoralyn Pick8/30/2021, 10:50 AM     Attending Physician Note  I have taken an interval history, reviewed the chart and examined the patient. I agree with the Advanced Practitioner's note, impression and recommendations.   Crohn's disease with ileal stricture, enteroenteric and enterocolonic fistulae with SBO which is resolving.  She made significant progress on IV Solumedrol, bowel rest, NG suction and is now tolerating a soft diet with no abdominal pain, no abdominal tenderness, no N/V. She stopped 6MP a couple weeks ago. Her next  dose of Humira was scheduled for tomorrow. Check Humira drug level and antibodies today. Decrease Solumedrol to 20 mg IV q12h. Tomorrow if tolerating diet can consider discharge on prednisone PO. Discussed outpatient mgmt options of tertiary referral, outpatient surgery, resuming 6MP, Remicade or Stelara.   Lucio Edward, MD [facility]

## 2020-07-02 NOTE — Progress Notes (Signed)
PROGRESS NOTE    Cynthia [name]  GYJ:[id number] DOB: 08-15-1961 DOA: 06/27/2020 PCP: Denita Lung, MD   Chief Complaint  Patient presents with  . Abdominal Pain    Brief Narrative:  59 y.o. female with medical history significant of Crohn's disease, depression, GERD who presents to the emergency department with complaints of progressively worsening abdominal discomfort.  Patient has Crohn's disease and is followed closely by gastroenterology.  Patient did report some diarrhea 1 day prior to hospital admission  ED Course: In the emergency department, patient underwent abdominal x-ray with findings consistent with bowel obstruction. Repeat CT in the ED with increasing chronic distention of the small bowel in the setting of fistula and stricture of the distal ileum, enteroenteric and enterocolonic fistula edition present, but worsening small bowel obstruction and persistent signs of surrounding inflammation with similar appearance no signs of abscess, GB distended without pericholecystic stranding or wall thickening Given progressive Crohn's disease with obstruction, gastroenterology and general surgery were consulted.  Hospitalist consulted for consideration for medical admission  Patient was admitted seen by GI surgery, being managed conservatively NG tube decompression IV fluids. Placed on IV steroids to help with Crohn's disease, plan is to try NG tube clamping and clear liquid and TPN. After starting the pain patient had pain shooting through the arm to the chest and almost felt like sugar in the brain, TPN was discontinued and patient felt better. Pt was continued on CLD and advanced to FLD and soft, tolerating, NGT off 8/29  Subjective:  Throat complainTs off NGT 8/29 overnight. Ambulating in room and feels well, had BM and passing gas tolerating soft diet  Assessment & Plan:  Severe fistulizing ileal Crohn's disease with a high-grade small bowel obstruction:Surgery and GI on  board. NG tube off 8/29 as pt was not tolerating well.  On soft diet since 8/29, tolerating well had bowel movement.Cont IV steroids as per GI currently at 20 mg every 8 hour, appreciate GI and surgery input.  Discussed w/ Dr Derrill Memo if tolerates po well today- change to po prednisone tomorrow  Severe protein calorie malnutrition,patient had strange reaction to TPN 8/28-has been discontinued.  TPN is not being considered at this time after discussing with GI, continue to augment nutrition  Encourage po. dietiatin consulted for home diet education  Hyperglycemia: 2/2 Steroid a nd IV steroid is being decreased.  GERD: Continue PPI.  Hypertension: BP poorly controlled, increase losartan to 100 mg, hctz up at 25 mg, cont and monitor, cont prn meds.  Hypokalemia: resolved.  Encourage ambulation, incentive spirometry, discussed in detail  DVT prophylaxis: enoxaparin (LOVENOX) injection 40 mg Start: 07/01/20 1200 Code Status:   Code Status: Full Code  Family Communication: plan of care discussed with patient and her caregiver at bedside.  Discussed with GI, nursing staff  Status is: Inpatient  Remains inpatient appropriate because:IV treatments appropriate due to intensity of illness or inability to take PO and Inpatient level of care appropriate due to severity of illness remains hospitalized for ongoing iv steroid.   Dispo: The patient is from: Home              Anticipated d/c is to: Home              Anticipated d/c date is: 1 day              Patient currently is not medically stable to d/c.  Diet Order            DIET  SOFT Room service appropriate? Yes; Fluid consistency: Thin  Diet effective now                 Body mass index is 28.24 kg/m. Consultants:see note  Procedures:see note Microbiology:see note Blood Culture    Component Value Date/Time   SDES URINE, RANDOM 11/14/2015 1642   SPECREQUEST NONE 11/14/2015 1642   CULT MULTIPLE SPECIES PRESENT, SUGGEST RECOLLECTION  11/14/2015 1642   REPTSTATUS 11/16/2015 FINAL 11/14/2015 1642    Other culture-see note  Medications: Scheduled Meds: . Chlorhexidine Gluconate Cloth  6 each Topical Daily  . enoxaparin (LOVENOX) injection  40 mg Subcutaneous Q24H  . feeding supplement  1 Container Oral TID BM  . hydrochlorothiazide  25 mg Oral Daily   And  . losartan  50 mg Oral Daily  . methylPREDNISolone (SOLU-MEDROL) injection  20 mg Intravenous Q8H  . pantoprazole  40 mg Oral BID  . sodium chloride flush  10-40 mL Intracatheter Q12H   Continuous Infusions: . sodium chloride 45 mL/hr at 07/02/20 0415    Antimicrobials: Anti-infectives (From admission, onward)   None     Objective: Vitals: Today's Vitals   07/01/20 2111 07/01/20 2118 07/02/20 0532 07/02/20 0641  BP:  (!) 160/92 (!) 165/111 (!) 168/104  Pulse:  92 79   Resp:  17 (!) 22 20  Temp:  98.3 F (36.8 C) 97.6 F (36.4 C)   TempSrc:      SpO2:  100% 100%   Weight:      Height:      PainSc: 6        Intake/Output Summary (Last 24 hours) at 07/02/2020 0750 Last data filed at 07/02/2020 0600 Gross per 24 hour  Intake 2100 ml  Output 0 ml  Net 2100 ml   Filed Weights   06/27/20 2100 06/29/20 1310 06/30/20 0607  Weight: 74.8 kg 72.5 kg 72.3 kg   Weight change:    Intake/Output from previous day: 08/29 0701 - 08/30 0700 In: 2100 [P.O.:840; I.V.:1260] Out: 0  Intake/Output this shift: No intake/output data recorded.  Examination:  General exam: AAOx3,NAD,weak appearing. HEENT:Oral mucosa moist, Ear/Nose WNL grossly, dentition normal. Respiratory system: bilaterally  clear,no wheezing or crackles,no use of accessory muscle Cardiovascular system: S1 & S2 +, No JVD,. Gastrointestinal system: Abdomen soft, NT,ND, BS+ Nervous System:Alert, awake, moving extremities and grossly nonfocal Extremities: No edema, distal peripheral pulses palpable.  Skin: No rashes,no icterus. MSK: Normal muscle bulk,tone, power  Data Reviewed: I have  personally reviewed following labs and imaging studies CBC: Recent Labs  Lab 06/27/20 0727 06/28/20 0512 06/30/20 0314 07/01/20 1148 07/02/20 0319  WBC 12.8* 11.3* 14.8* 11.7* 12.1*  NEUTROABS  --   --  12.8* 10.0*  --   HGB 15.2* 13.3 12.9 14.0 13.1  HCT 45.7 42.1 40.5 43.2 40.4  MCV 86.1 92.7 90.2 88.9 89.4  PLT 384 318 237 329 706   Basic Metabolic Panel: Recent Labs  Lab 06/27/20 0727 06/27/20 0727 06/28/20 0512 06/29/20 1013 06/30/20 0314 07/01/20 0843 07/02/20 0319  NA 140   < > 139 139 139 140 137  K 2.9*   < > 4.3 3.8 3.6 3.7 4.0  CL 98   < > 105 103 104 106 103  CO2 29   < > 26 24 26 26 27   GLUCOSE 149*   < > 87 156* 133* 143* 125*  BUN 12   < > 12 9 9 7 12   CREATININE 0.85   < > 0.78  0.65 0.61 0.60 0.74  CALCIUM 9.2   < > 8.2* 8.9 8.8* 8.8* 9.0  MG 1.9  --   --  2.1 1.9  --   --   PHOS  --   --   --  3.6 3.3  --   --    < > = values in this interval not displayed.   GFR: Estimated Creatinine Clearance: 71.3 mL/min (by C-G formula based on SCr of 0.74 mg/dL). Liver Function Tests: Recent Labs  Lab 06/27/20 0727 06/28/20 0512 06/30/20 0314 07/01/20 0843  AST 13* 18 11* 9*  ALT 10 10 10 10   ALKPHOS 61 57 60 55  BILITOT 0.5 0.9 0.5 0.2*  PROT 7.1 6.3* 6.1* 6.5  ALBUMIN 3.3* 2.8* 2.8* 2.8*   Recent Labs  Lab 06/27/20 0727  LIPASE 22   No results for input(s): AMMONIA in the last 168 hours. Coagulation Profile: No results for input(s): INR, PROTIME in the last 168 hours. Cardiac Enzymes: No results for input(s): CKTOTAL, CKMB, CKMBINDEX, TROPONINI in the last 168 hours. BNP (last 3 results) No results for input(s): PROBNP in the last 8760 hours. HbA1C: No results for input(s): HGBA1C in the last 72 hours. CBG: Recent Labs  Lab 06/29/20 1715 06/29/20 2336 06/30/20 0601 06/30/20 1136 06/30/20 1737  GLUCAP 251* 112* 110* 179* 217*   Lipid Profile: Recent Labs    06/30/20 0314  TRIG 72   Thyroid Function Tests: No results for  input(s): TSH, T4TOTAL, FREET4, T3FREE, THYROIDAB in the last 72 hours. Anemia Panel: No results for input(s): VITAMINB12, FOLATE, FERRITIN, TIBC, IRON, RETICCTPCT in the last 72 hours. Sepsis Labs: No results for input(s): PROCALCITON, LATICACIDVEN in the last 168 hours.  Recent Results (from the past 240 hour(s))  SARS Coronavirus 2 by RT PCR (hospital order, performed in [facility] hospital lab) Nasopharyngeal Nasopharyngeal Swab     Status: None   Collection Time: 06/27/20 10:56 AM   Specimen: Nasopharyngeal Swab  Result Value Ref Range Status   SARS Coronavirus 2 NEGATIVE NEGATIVE Final    Comment: (NOTE) SARS-CoV-2 target nucleic acids are NOT DETECTED.  The SARS-CoV-2 RNA is generally detectable in upper and lower respiratory specimens during the acute phase of infection. The lowest concentration of SARS-CoV-2 viral copies this assay can detect is 250 copies / mL. A negative result does not preclude SARS-CoV-2 infection and should not be used as the sole basis for treatment or other patient management decisions.  A negative result may occur with improper specimen collection / handling, submission of specimen other than nasopharyngeal swab, presence of viral mutation(s) within the areas targeted by this assay, and inadequate number of viral copies (<250 copies / mL). A negative result must be combined with clinical observations, patient history, and epidemiological information.  Fact Sheet for Patients:   StrictlyIdeas.no  Fact Sheet for Healthcare Providers: BankingDealers.co.za  This test is not yet approved or  cleared by the [place] FDA and has been authorized for detection and/or diagnosis of SARS-CoV-2 by FDA under an Emergency Use Authorization (EUA).  This EUA will remain in effect (meaning this test can be used) for the duration of the COVID-19 declaration under Section 564(b)(1) of the Act, 21 U.S.C. section  360bbb-3(b)(1), unless the authorization is terminated or revoked sooner.  Performed at [facility], [place] [address], [place], [place] [address]       Radiology Studies: No results found.   LOS: 5 days   Antonieta Pert, MD [facility]  07/02/2020, 7:50 AM

## 2020-07-03 ENCOUNTER — Telehealth: Payer: Self-pay

## 2020-07-03 ENCOUNTER — Other Ambulatory Visit: Payer: Self-pay

## 2020-07-03 LAB — BASIC METABOLIC PANEL
Anion gap: 9 (ref 5–15)
BUN: 12 mg/dL (ref 6–20)
CO2: 27 mmol/L (ref 22–32)
Calcium: 8.5 mg/dL — ABNORMAL LOW (ref 8.9–10.3)
Chloride: 98 mmol/L (ref 98–111)
Creatinine, Ser: 0.72 mg/dL (ref 0.44–1.00)
GFR calc Af Amer: >60 mL/min (ref >60)
GFR calc non Af Amer: >60 mL/min (ref >60)
Glucose, Bld: 84 mg/dL (ref 70–99)
Potassium: 3.3 mmol/L — ABNORMAL LOW (ref 3.5–5.1)
Sodium: 134 mmol/L — ABNORMAL LOW (ref 135–145)

## 2020-07-03 LAB — CBC
HCT: 37.5 % (ref 36.0–46.0)
Hemoglobin: 12 g/dL (ref 12.0–15.0)
MCH: 28.4 pg (ref 26.0–34.0)
MCHC: 32 g/dL (ref 30.0–36.0)
MCV: 88.9 fL (ref 80.0–100.0)
Platelets: 235 10*3/uL (ref 150–400)
RBC: 4.22 MIL/uL (ref 3.87–5.11)
RDW: 13.5 % (ref 11.5–15.5)
WBC: 10.3 10*3/uL (ref 4.0–10.5)
nRBC: 0 % (ref 0.0–0.2)

## 2020-07-03 MED ORDER — POTASSIUM CHLORIDE CRYS ER 20 MEQ PO TBCR
40.0000 meq | EXTENDED_RELEASE_TABLET | Freq: Once | ORAL | Status: AC
  Administered 2020-07-03: 40 meq via ORAL
  Filled 2020-07-03: qty 2

## 2020-07-03 MED ORDER — POTASSIUM CHLORIDE CRYS ER 20 MEQ PO TBCR: 20.0000 meq | EXTENDED_RELEASE_TABLET | Freq: Every day | ORAL | 0 refills | Status: DC

## 2020-07-03 MED ORDER — LOSARTAN POTASSIUM-HCTZ 50-12.5 MG PO TABS: 2.0000 | ORAL_TABLET | Freq: Every day | ORAL | 0 refills | Status: DC

## 2020-07-03 MED ORDER — PREDNISONE 20 MG PO TABS: ORAL_TABLET | ORAL | 0 refills | Status: DC

## 2020-07-03 MED ORDER — ONDANSETRON 4 MG PO TBDP: 4.0000 mg | ORAL_TABLET | Freq: Four times a day (QID) | ORAL | 1 refills | Status: DC | PRN

## 2020-07-03 MED ORDER — POTASSIUM CHLORIDE CRYS ER 20 MEQ PO TBCR: 20.0000 meq | EXTENDED_RELEASE_TABLET | Freq: Every day | ORAL | Status: DC

## 2020-07-03 MED ORDER — ESOMEPRAZOLE MAGNESIUM 40 MG PO CPDR: DELAYED_RELEASE_CAPSULE | ORAL | 3 refills | Status: DC

## 2020-07-03 MED ORDER — HYOSCYAMINE SULFATE 0.125 MG PO TABS: ORAL_TABLET | ORAL | 1 refills | Status: DC

## 2020-07-03 NOTE — Progress Notes (Addendum)
Patient ID: Cynthia [name], female   DOB: 1961-10-08, 59 y.o.   MRN: [id number]    Progress Note   Subjective  Day # 6 CC: Crohn's ileitis with small bowel obstruction, fistulas  Solu-Medrol 20 mg every 12h 6-MP stopped recently as outpatient Off Humira  WBC 10.3, hemoglobin 12 Potassium 3.3 Adalimumab antibody, and trough level pending  Patient is feeling much better, tolerating soft diet, had a formed bowel movement this morning.  No significant abdominal pain.  She feels that she can manage at home    Objective   Vital signs in last 24 hours: Temp:  [98 F (36.7 C)-98.5 F (36.9 C)] 98.5 F (36.9 C) (08/31 0600) Pulse Rate:  [74] 74 (08/31 0600) Resp:  [15] 15 (08/31 0600) BP: (153-154)/(89-91) 153/91 (08/31 0600) SpO2:  [99 %-100 %] 100 % (08/31 0600) Last BM Date: 07/02/20 General:    white female in NAD Heart:  Regular rate and rhythm; no murmurs Lungs: Respirations even and unlabored, lungs CTA bilaterally Abdomen:  Soft, nontender and nondistended. Normal bowel sounds. Extremities:  Without edema. Neurologic:  Alert and oriented,  grossly normal neurologically. Psych:  Cooperative. Normal mood and affect.  Intake/Output from previous day: 08/30 0701 - 08/31 0700 In: 2400 [P.O.:1320; I.V.:1080] Out: 0  Intake/Output this shift: No intake/output data recorded.  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  Obstruction has resolved with bowel rest and IV steroids.  Patient had been on Humira as an outpatient-antibody and trough level are pending.  As she failed management with Humira, will need to switch to another biologic. Decision will be made on an outpatient basis once antibody level etc. resulted and reviewed.  Consider Remicade versus Stelara.  Please discharge patient home on 60 mg prednisone daily x1 week, then 40 mg daily thereafter which she may need for several weeks, until new biologic regimen begun. We will send Rx for Zofran 4 mg every 6 hours as needed, and Levsin sublingual as needed for abdominal cramping. Patient cautioned to remain on low residue diet. We will arrange for office follow-up with Dr. Fuller Plan as soon as possible.  She may also benefit from tertiary care consultation regarding management of her complicated Crohn's and also surgical consultation regarding management of the associated enteroenteric and enterocolonic fistulas which are difficult to manage even with Biologics.  GERD - continue twice daily PPI  Hypokalemia - replace per hospitalist   Principal Problem:   Crohn's ileitis, with intestinal obstruction ([place]) Active Problems:   GERD   Hypertension   Obesity (BMI 30-39.9)   Crohn disease ([place])    LOS: 6 days   Amy Esterwood PA-C 07/03/2020, 8:22 AM  Attending Physician Note   I have taken an interval history, reviewed the chart and examined the patient. I agree with the Advanced Practitioner's note, impression and recommendations.   Lucio Edward, MD [facility]

## 2020-07-03 NOTE — Telephone Encounter (Signed)
-----   Message from Cynthia Ferguson, PA-C sent at 07/03/2020 11:15 AM EDT ----- Regarding: office follow up [facility], patient being discharged from the hospital today-Please send Rx for Zofran 4 mg every 6 hours as needed for nausea #30 and 1 refill and also send Rx for Levsin sublingual every 4 to 6 hours as needed for significant abdominal cramping, #40/1 refill   Patient needs office visit with Dr. Fuller Plan preferably within 2 to 3 weeks, if absolutely no availability okay to put on Colleen schedule or my schedule in 2 weeks.  Need to discuss switching Biologics.  Thank you Please call the patient with office appointment, can wait until tomorrow as she is just being discharged today

## 2020-07-03 NOTE — Plan of Care (Signed)
Pt BP elevated.   Scheduled BP meds administered per MAR.  Pain reports pain in throat post NG tube.  PRN pain meds administered per MAR.  No further complaints at this time.   Problem: Education: Goal: Knowledge of General Education information will improve Description: Including pain rating scale, medication(s)/side effects and non-pharmacologic comfort measures Outcome: Progressing   Problem: Health Behavior/Discharge Planning: Goal: Ability to manage health-related needs will improve Outcome: Progressing   Problem: Clinical Measurements: Goal: Ability to maintain clinical measurements within normal limits will improve Outcome: Progressing Goal: Will remain free from infection Outcome: Progressing Goal: Diagnostic test results will improve Outcome: Progressing Goal: Respiratory complications will improve Outcome: Progressing Goal: Cardiovascular complication will be avoided Outcome: Progressing   Problem: Activity: Goal: Risk for activity intolerance will decrease Outcome: Progressing   Problem: Nutrition: Goal: Adequate nutrition will be maintained Outcome: Progressing   Problem: Coping: Goal: Level of anxiety will decrease Outcome: Progressing   Problem: Elimination: Goal: Will not experience complications related to bowel motility Outcome: Progressing Goal: Will not experience complications related to urinary retention Outcome: Progressing   Problem: Pain Managment: Goal: General experience of comfort will improve Outcome: Progressing   Problem: Safety: Goal: Ability to remain free from injury will improve Outcome: Progressing   Problem: Skin Integrity: Goal: Risk for impaired skin integrity will decrease Outcome: Progressing

## 2020-07-03 NOTE — Progress Notes (Signed)
Pt stable and ready for d/c.  Discharge instructions provided to and reviewed with patient.  PICC line removed by IV team.  Dsg clean, dry, intact.  Pt transported to front lobby via wheelchair by RN.

## 2020-07-03 NOTE — Telephone Encounter (Signed)
Spoke with the patient. She confirms her pharmacy as [facility]. Accepts appointment for 07/16/20 with Dr Fuller Plan.Encouraged to call with any questions or concerns.

## 2020-07-03 NOTE — Discharge Summary (Signed)
Physician Discharge Summary  Cynthia [name] OIN:[id number] DOB: 28-Jun-1961 DOA: 06/27/2020  PCP: Denita Lung, MD  Admit date: 06/27/2020 Discharge date: 07/03/2020  Admitted From: home Disposition:  home  Recommendations for Outpatient Follow-up:  1. Follow up with PCP in 1-2 weeks 2. Please obtain BMP/CBC in one week 3. Please follow up on the following pending results:  Home Health:no  Equipment/Devices: none  Discharge Condition: Stable Code Status:   Code Status: Full Code Diet recommendation:  Diet Order            DIET SOFT Room service appropriate? Yes; Fluid consistency: Thin  Diet effective now                  Brief/Interim Summary:  59 y.o.femalewith medical history significant ofCrohn's disease, depression, GERD who presents to the emergency department with complaints of progressively worsening abdominal discomfort.Patient has Crohn's disease and is followed closely by gastroenterology. Patient did report some diarrhea 1 day prior to hospital admission  ED Course:In the emergency department, patient underwent abdominal x-ray with findings consistent with bowel obstruction. 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 Hospitalist consulted for consideration for medical admission  Patient was admitted seen by GI surgery, being managed conservatively NG tube decompression IV fluids. Placed on IV steroids to help with Crohn's disease, plan is to try NG tube clamping and clear liquid and TPN. After starting the pain  patient had pain shooting through the arm to the chest and almost felt like sugar in the brain, TPN was discontinued and patient felt better. Pt was continued on CLD and advanced to FLD and soft, tolerating, NGT off 8/29 Diet was slowly advanced, not tolerating soft diet, off NG tube 8/29. Has had multiple bowel movements passing gas ambulating.  Seen by GI and okay for discharge home today on soft diet.  Extensive dietary education printouts provided by dietitian. She will be going on oral prednisone as instructed by GI. 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  At this time improved still going on soft diet as outlined along with prednisone and further follow-up as per GI.  May need surgical intervention at some point in the future. Gi noted: "Please discharge patient home on 60 mg prednisone daily x1 week, then 40 mg daily thereafter which she may need for several weeks, until new biologic regimen begun. We will send Rx for Zofran 4 mg every 6 hours as needed, and Levsin sublingual as needed for abdominal cramping. Patient cautioned to remain on low residue diet. We will arrange for office follow-up with Dr. Fuller Plan as soon as possible. She may also benefit from tertiary care consultation regarding management of her complicated Crohn's and also surgical  consultation regarding management of the associated enteroenteric and enterocolonic fistulas which are difficult to manage even with Biologics."  Severe protein calorie  malnutrition, augment diet seen by dietitian.   Hyperglycemia: 2/2 Steroid a nd IV steroid is being decreased.  GERD: Continue PPI.  Hypertension: BP overall stabilized cont on losartan to 100 mg, hctz up at 25 mg, w/ kcl po  fu w// pcp  Hypokalemia:  Added potassium chloride  supplementation while she is on HCTZ   Consults:  GI  Subjective: Resting well tolerating diet soft diet, has had bowel movement.  Feels well enough to go home today.  Seen by GI and okay for discharge home on oral steroid.  Discharge Exam: Vitals:   07/03/20 0600 07/03/20 0824  BP: (!) 153/91 (!) 158/94  Pulse: 74 76  Resp: 15   Temp: 98.5 F (36.9 C)   SpO2: 100%    General: Pt is alert, awake, not in acute distress Cardiovascular: RRR, S1/S2 +, no rubs, no gallops Respiratory: CTA bilaterally, no wheezing, no rhonchi Abdominal: Soft, NT, ND, bowel sounds + Extremities: no edema, no cyanosis  Discharge Instructions  Discharge Instructions    Discharge instructions   Complete by: As directed    Please check your potassium level in 1 week by PCP to adjust her potassium chloride dose, for now you have been prescribed 1 to 2 weeks of potassium chloride and may need longer  Continue prednisone as ordered with 60 mg once a day for 1 week after that 40 mg daily and follow-up with GI  Please keep on low fiber/soft diet as instructed  Please call call MD or return to ER for similar or worsening recurring problem that brought you to hospital or if any fever,nausea/vomiting,abdominal pain, uncontrolled pain, chest pain,  shortness of breath or any other alarming symptoms.  Please follow-up your doctor as instructed in a week time and call the office for appointment.  Please avoid alcohol, smoking, or any other illicit substance and maintain healthy habits including taking your regular medications as prescribed.  You were cared for by a hospitalist during your hospital stay. If you have any  questions about your discharge medications or the care you received while you were in the hospital after you are discharged, you can call the unit and ask to speak with the hospitalist on call if the hospitalist that took care of you is not available.  Once you are discharged, your primary care physician will handle any further medical issues. Please note that NO REFILLS for any discharge medications will be authorized once you are discharged, as it is imperative that you return to your primary care physician (or establish a relationship with a primary care physician if you do not have one) for your aftercare needs so that they can reassess your need for medications and monitor your lab values   Increase activity slowly   Complete by: As directed      Allergies as of 07/03/2020      Reactions   Iodine    REACTION: in eye gtts Iodine eye drops; pt has no reactions to CT contrast      Medication List    TAKE these medications   dicyclomine 10 MG capsule Commonly known as: BENTYL Take 1 capsule (10 mg total) by mouth 3 (three) times daily before meals.   esomeprazole 40 MG capsule Commonly known as: NEXIUM TAKE ONE (1) CAPSULE BY MOUTH 2 TIMES DAILY What changed:   how much to take  how to take this  when to take this  reasons to take this   HUMIRA PEN [place] Inject 40 mg into the skin every 14 (fourteen) days.   losartan-hydrochlorothiazide 50-12.5 MG tablet Commonly known as: HYZAAR Take 2 tablets by mouth daily. What changed: how much to take   ondansetron 4 MG tablet Commonly known as: ZOFRAN Take 1 tablet (4 mg total) by mouth 3 (three) times daily as needed for nausea or vomiting.   potassium chloride SA 20 MEQ tablet Commonly known as: KLOR-CON Take 1 tablet (20 mEq total) by mouth daily for 14 days.   pramipexole 1 MG tablet Commonly known as: MIRAPEX Take 1 tablet by mouth daily   predniSONE 20 MG tablet Commonly known as: DELTASONE Take 60 mg daily x1 wk then  40 mg daily until further instruction by GI   promethazine 25 MG tablet Commonly known as: PHENERGAN Take 1 tablet (25 mg total) by mouth every 6 (six) hours as needed for nausea or vomiting.   venlafaxine XR 150 MG 24 hr capsule Commonly known as: EFFEXOR-XR Take 2 capsules by mouth daily       Follow-up Information    Denita Lung, MD Follow up.   Specialty: Family Medicine Why: In 1 week for BMP check, blood pressure monitoring Contact information: [place] [address] [phone]        [name], MD. Call in 1 week(s).   Specialty: Gastroenterology Contact information: [address] [address] [phone]              Allergies  Allergen Reactions  . Iodine     REACTION: in eye gtts Iodine eye drops; pt has no reactions to CT contrast    The results of significant diagnostics from this hospitalization (including imaging, microbiology, ancillary and laboratory) are listed below for reference.    Microbiology: Recent Results (from the past 240 hour(s))  SARS Coronavirus 2 by RT PCR (hospital order, performed in [facility] hospital lab) Nasopharyngeal Nasopharyngeal Swab     Status: None   Collection Time: 06/27/20 10:56 AM   Specimen: Nasopharyngeal Swab  Result Value Ref Range Status   SARS Coronavirus 2 NEGATIVE NEGATIVE Final    Comment: (NOTE) SARS-CoV-2 target nucleic acids are NOT DETECTED.  The SARS-CoV-2 RNA is generally detectable in upper and lower respiratory specimens during the acute phase of infection. The lowest concentration of SARS-CoV-2 viral copies this assay can detect is 250 copies / mL. A negative result does not preclude SARS-CoV-2 infection and should not be used as the sole basis for treatment or other patient management decisions.  A negative result may occur with improper specimen collection / handling, submission of specimen other than nasopharyngeal swab, presence of viral  mutation(s) within the areas targeted by this assay, and inadequate number of viral copies (<250 copies / mL). A negative result must be combined with clinical observations, patient history, and epidemiological information.  Fact Sheet for Patients:   StrictlyIdeas.no  Fact Sheet for Healthcare Providers: BankingDealers.co.za  This test is not yet approved or  cleared by the [place] FDA and has been authorized for detection and/or diagnosis of SARS-CoV-2 by FDA under an Emergency Use Authorization (EUA).  This EUA will remain in effect (meaning this test can be used) for the duration of the COVID-19 declaration under Section 564(b)(1) of the Act, 21 U.S.C. section 360bbb-3(b)(1), unless the authorization is terminated or revoked sooner.  Performed at [facility], [place] [address] [place], [place] [address]     Procedures/Studies: DG Abdomen 1 View  Result Date: 06/27/2020 CLINICAL DATA:  Crohn's disease with concern for bowel obstruction EXAM: ABDOMEN - 1 VIEW COMPARISON:  CT abdomen and pelvis June 12, 2020 FINDINGS: There are multiple loops of dilated small bowel without appreciable air-fluid level. No free air appreciable on supine examination. A small amount of air is noted scattered throughout the colon and rectum. Visualized lung bases are clear. IMPRESSION: Loops of dilated small bowel in a pattern concerning for a degree of bowel obstruction. No free air evident. Electronically Signed   By: Lowella Grip III M.D.   On: 06/27/2020 08:27   CT ABDOMEN PELVIS W CONTRAST  Result Date: 06/27/2020 CLINICAL DATA:  Bowel obstruction is suspected EXAM: CT ABDOMEN AND PELVIS WITH CONTRAST TECHNIQUE: Multidetector CT imaging of the abdomen and pelvis was performed using the standard protocol following bolus administration of intravenous contrast. CONTRAST:  148m OMNIPAQUE IOHEXOL 300 MG/ML  SOLN COMPARISON:   June 12, 2020 FINDINGS: Lower chest: Incidental imaging of the lung bases is unremarkable. No consolidation. No pleural effusion. Hepatobiliary: Gallbladder is distended with sludge and or partially radiopaque stones in the lumen. No pericholecystic stranding. No biliary duct dilation. No focal, suspicious hepatic lesion. Pancreas: Pancreas is normal without focal lesion or ductal dilation. Spleen: Spleen normal in size and contour. Adrenals/Urinary Tract: Adrenal glands are normal. Kidneys enhance symmetrically. No hydronephrosis. Urinary bladder is unremarkable aside from stranding about the bladder in the setting of complex fistula in the pelvis. Limited assessment limiting assessment of the urinary bladder. No bladder wall thickening. Stomach/Bowel: Marked bowel dilation of small bowel in the setting of known Crohn's disease. Bowel is increasingly distended as compared to the previous study approximately 4.7 cm just above the level of mural stratification and narrowing in the distal ileum that is associated with and intro enteric as well as potentially enterocolonic fistula in this location. Colonic thickening is slightly improved perhaps compared to the prior study but there is still extensive inflammation in the pelvis signs of perienteric stranding. Mid small bowel loops are also increasingly dilated to approximately 4.8 cm. Areas in the mid small bowel previously approximately 4.4 cm. Vascular/Lymphatic: No aneurysmal dilation of the abdominal aorta. No adenopathy in the retroperitoneum. No pelvic lymphadenopathy. Reproductive: Post hysterectomy. Other: No free air. Musculoskeletal: No acute bone finding. No destructive bone process. IMPRESSION: 1. 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 Worsening partial small bowel obstruction associated with above findings. 2. Persistent  signs of surrounding inflammation with similar appearance. 3. No signs of abscess. 4. Gallbladder distension without pericholecystic stranding or wall thickening. Correlate with any RIGHT upper quadrant symptoms or laboratory abnormalities as warranted. 5. Aortic atherosclerosis. Aortic Atherosclerosis (ICD10-I70.0). Electronically Signed   By: GZetta BillsM.D.   On: 06/27/2020 16:25   CT ENTERO ABD/PELVIS W CONTAST  Result Date: 06/13/2020 CLINICAL DATA:  Crohn's disease diagnosed 10/20. Mid abdominal pain and distension for 3 weeks. Nausea vomiting. Weight loss. History of pancreatitis. EXAM: CT ABDOMEN AND PELVIS WITH CONTRAST (ENTEROGRAPHY) TECHNIQUE: Multidetector CT of the abdomen and pelvis during bolus administration of intravenous contrast. Negative oral contrast was given. CONTRAST:  1052mOMNIPAQUE IOHEXOL 300 MG/ML  SOLN COMPARISON:  08/26/2019 FINDINGS: Lower chest: Clear lung bases. Normal heart size without pericardial or pleural effusion. Hepatobiliary: Normal liver. Normal gallbladder, without biliary ductal dilatation.  Pancreas: Normal, without mass or ductal dilatation. Spleen: Normal in size, without focal abnormality. Adrenals/Urinary Tract: Normal adrenal glands. Normal kidneys, without hydronephrosis. Normal bladder. No air within or wall thickening of. Stomach/Bowel: Normal stomach, without wall thickening. The descending and transverse colon are decompressed. Normal appendix. The extent of small bowel distension with neutral contrast is good. Normal terminal ileum, including on 27/4. The proximal small bowel is moderately dilated, increased. Example small-bowel loop at 4.4 cm on 208/4. Followed to the level of an area of inflamed distal ileum. Example on 314/4. Multiple areas of small bowel/small bowel fistula are identified in this area (example image 312/4.) the extent of inflammation is increased compared to 08/26/2019. Suspect fistulous communication to the sigmoid including on  03/20 5/4. No free extraluminal gas or well-defined fluid collection. Minimal interloop fluid is identified. Vascular/Lymphatic: Aortic atherosclerosis. Mild ileocolic mesenteric adenopathy is presumably reactive. Example 7 mm on 57/2. Reproductive: Hysterectomy.  No adnexal mass. Other: Small volume pelvic fluid. Musculoskeletal: No sacroiliitis. Minimal degenerative sclerosis of the bilateral sacroiliac joints. Mild S shaped thoracolumbar spine curvature. IMPRESSION: 1. 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 2. Mild mesenteric adenopathy in interloop fluid, likely secondary. No drainable abscess. Electronically Signed   By: Abigail Miyamoto M.D.   On: 06/13/2020 08:22   DG Abd Portable 1V  Result Date: 06/28/2020 CLINICAL DATA:  Small bowel obstruction. EXAM: PORTABLE ABDOMEN - 1 VIEW COMPARISON:  June 27, 2020. FINDINGS: Dilated small bowel loops are noted concerning for distal small bowel obstruction. No colonic dilatation is noted. Residual contrast is noted in the colon. Distal tip of nasogastric tube is seen in the expected position of the stomach. IMPRESSION: Dilated small bowel loops are noted concerning for distal small bowel obstruction. Electronically Signed   By: Marijo Conception M.D.   On: 06/28/2020 08:12   DG Abd Portable 1V  Result Date: 06/27/2020 CLINICAL DATA:  NG tube placement EXAM: PORTABLE ABDOMEN - 1 VIEW COMPARISON:  06/27/2020 FINDINGS: Esophageal tube tip overlies the gastric fundus. Contrast within the renal collecting systems. Dilated small bowel in the upper abdomen. IMPRESSION: Esophageal tube tip overlies the gastric fundus. Electronically Signed   By: Donavan Foil M.D.   On: 06/27/2020 20:47   [place] EKG SITE RITE  Result Date: 06/28/2020 If Site Rite image not attached, placement could not be confirmed due to  current cardiac rhythm.   Labs: BNP (last 3 results) No results for input(s): BNP in the last 8760 hours. Basic Metabolic Panel: Recent Labs  Lab 06/27/20 0727 06/28/20 0512 06/29/20 1013 06/30/20 0314 07/01/20 0843 07/02/20 0319 07/03/20 0444  NA 140   < > 139 139 140 137 134*  K 2.9*   < > 3.8 3.6 3.7 4.0 3.3*  CL 98   < > 103 104 106 103 98  CO2 29   < > 24 26 26 27 27   GLUCOSE 149*   < > 156* 133* 143* 125* 84  BUN 12   < > 9 9 7 12 12   CREATININE 0.85   < > 0.65 0.61 0.60 0.74 0.72  CALCIUM 9.2   < > 8.9 8.8* 8.8* 9.0 8.5*  MG 1.9  --  2.1 1.9  --   --   --   PHOS  --   --  3.6 3.3  --   --   --    < > =  values in this interval not displayed.   Liver Function Tests: Recent Labs  Lab 06/27/20 0727 06/28/20 0512 06/30/20 0314 07/01/20 0843  AST 13* 18 11* 9*  ALT 10 10 10 10   ALKPHOS 61 57 60 55  BILITOT 0.5 0.9 0.5 0.2*  PROT 7.1 6.3* 6.1* 6.5  ALBUMIN 3.3* 2.8* 2.8* 2.8*   Recent Labs  Lab 06/27/20 0727  LIPASE 22   No results for input(s): AMMONIA in the last 168 hours. CBC: Recent Labs  Lab 06/28/20 0512 06/30/20 0314 07/01/20 1148 07/02/20 0319 07/03/20 0444  WBC 11.3* 14.8* 11.7* 12.1* 10.3  NEUTROABS  --  12.8* 10.0*  --   --   HGB 13.3 12.9 14.0 13.1 12.0  HCT 42.1 40.5 43.2 40.4 37.5  MCV 92.7 90.2 88.9 89.4 88.9  PLT 318 237 329 267 235   Cardiac Enzymes: No results for input(s): CKTOTAL, CKMB, CKMBINDEX, TROPONINI in the last 168 hours. BNP: Invalid input(s): POCBNP CBG: Recent Labs  Lab 06/29/20 1715 06/29/20 2336 06/30/20 0601 06/30/20 1136 06/30/20 1737  GLUCAP 251* 112* 110* 179* 217*   D-Dimer No results for input(s): DDIMER in the last 72 hours. Hgb A1c No results for input(s): HGBA1C in the last 72 hours. Lipid Profile No results for input(s): CHOL, HDL, LDLCALC, TRIG, CHOLHDL, LDLDIRECT in the last 72 hours. Thyroid function studies No results for input(s): TSH, T4TOTAL, T3FREE, THYROIDAB in the last 72  hours.  Invalid input(s): FREET3 Anemia work up No results for input(s): VITAMINB12, FOLATE, FERRITIN, TIBC, IRON, RETICCTPCT in the last 72 hours. Urinalysis    Component Value Date/Time   COLORURINE YELLOW 06/27/2020 1037   APPEARANCEUR HAZY (A) 06/27/2020 1037   LABSPEC 1.018 06/27/2020 1037   LABSPEC 1.025 05/13/2018 1018   PHURINE 7.0 06/27/2020 1037   GLUCOSEU NEGATIVE 06/27/2020 1037   HGBUR NEGATIVE 06/27/2020 1037   BILIRUBINUR NEGATIVE 06/27/2020 1037   BILIRUBINUR negative 05/13/2018 1018   BILIRUBINUR n 12/04/2011 1014   [place] 06/27/2020 1037   PROTEINUR 30 (A) 06/27/2020 1037   UROBILINOGEN negative 12/04/2011 1014   NITRITE POSITIVE (A) 06/27/2020 1037   LEUKOCYTESUR TRACE (A) 06/27/2020 1037   Sepsis Labs Invalid input(s): PROCALCITONIN,  WBC,  LACTICIDVEN Microbiology Recent Results (from the past 240 hour(s))  SARS Coronavirus 2 by RT PCR (hospital order, performed in [place] hospital lab) Nasopharyngeal Nasopharyngeal Swab     Status: None   Collection Time: 06/27/20 10:56 AM   Specimen: Nasopharyngeal Swab  Result Value Ref Range Status   SARS Coronavirus 2 NEGATIVE NEGATIVE Final    Comment: (NOTE) SARS-CoV-2 target nucleic acids are NOT DETECTED.  The SARS-CoV-2 RNA is generally detectable in upper and lower respiratory specimens during the acute phase of infection. The lowest concentration of SARS-CoV-2 viral copies this assay can detect is 250 copies / mL. A negative result does not preclude SARS-CoV-2 infection and should not be used as the sole basis for treatment or other patient management decisions.  A negative result may occur with improper specimen collection / handling, submission of specimen other than nasopharyngeal swab, presence of viral mutation(s) within the areas targeted by this assay, and inadequate number of viral copies (<250 copies / mL). A negative result must be combined with clinical observations, patient  history, and epidemiological information.  Fact Sheet for Patients:   StrictlyIdeas.no  Fact Sheet for Healthcare Providers: BankingDealers.co.za  This test is not yet approved or  cleared by the [place] FDA and has been authorized for  detection and/or diagnosis of SARS-CoV-2 by FDA under an Emergency Use Authorization (EUA).  This EUA will remain in effect (meaning this test can be used) for the duration of the COVID-19 declaration under Section 564(b)(1) of the Act, 21 U.S.C. section 360bbb-3(b)(1), unless the authorization is terminated or revoked sooner.  Performed at [facility], [place] [address], [place],  [address]      Time coordinating discharge: 35  minutes  SIGNED: Antonieta Pert, MD  [facility] 07/03/2020, 12:00 PM  If 7PM-7AM, please contact night-coverage [email or web address]

## 2020-07-04 ENCOUNTER — Telehealth: Payer: Self-pay | Admitting: Family Medicine

## 2020-07-04 NOTE — Telephone Encounter (Signed)
Fax from [facility]  Esomeprazole 26m #180 take 1 capsule 2 times daily  Missing/inelligible information on rx  Clarification: exceeds insurance quantity limits  max 1 capsule daily   Call for prior auth   [phone]

## 2020-07-05 ENCOUNTER — Telehealth: Payer: Self-pay

## 2020-07-05 NOTE — Telephone Encounter (Signed)
Called pt. Cynthia [name] to call back to get scheduled for a hospital f/u for her recent hospitalization.

## 2020-07-06 ENCOUNTER — Telehealth: Payer: Self-pay

## 2020-07-06 NOTE — Telephone Encounter (Signed)
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 She is going home on Prednisone.  Humira drug level and Ab have been sent and are pending.  Follow up office appt with me, CK or AE within 2 weeks (no later).  We need to change her to [facility] for her complicated Crohn's so please start the process.

## 2020-07-06 NOTE — Telephone Encounter (Signed)
Initiated referral to [facility] infusion for new start Stelara.  They are not sure if they take her insurance.  They will investigate and let me know. She has follow up with Dr. Fuller Plan on 07/16/20

## 2020-07-08 ENCOUNTER — Telehealth: Payer: Self-pay | Admitting: Family Medicine

## 2020-07-08 LAB — ADALIMUMAB LEVEL AND ANTIBODY
Adalimumab Drug Level: 1.7 ug/mL
Anti-Adalimumab Antibody: 40 ng/mL

## 2020-07-08 NOTE — Telephone Encounter (Signed)
P.A. ESOMEPRAZOLE °

## 2020-07-13 ENCOUNTER — Encounter: Payer: Self-pay | Admitting: Family Medicine

## 2020-07-13 ENCOUNTER — Telehealth: Payer: Self-pay | Admitting: Gastroenterology

## 2020-07-13 NOTE — Telephone Encounter (Signed)
Patient notified that will forward referral to [facility].  She will keep her appt on Monday with Dr. Fuller Plan.

## 2020-07-14 NOTE — Telephone Encounter (Signed)
P.A. denied, appeal letter completed for BID dosing

## 2020-07-16 ENCOUNTER — Encounter: Payer: Self-pay | Admitting: Gastroenterology

## 2020-07-16 ENCOUNTER — Ambulatory Visit (INDEPENDENT_AMBULATORY_CARE_PROVIDER_SITE_OTHER): Payer: 59 | Admitting: Gastroenterology

## 2020-07-16 VITALS — BP 150/90 | HR 88 | Ht 63.75 in | Wt 159.2 lb

## 2020-07-16 DIAGNOSIS — K50019 Crohn's disease of small intestine with unspecified complications: Secondary | ICD-10-CM | POA: Diagnosis not present

## 2020-07-16 NOTE — Patient Instructions (Signed)
If you are age 61 or older, your body mass index should be between 23-30. Your Body mass index is 27.55 kg/m. If this is out of the aforementioned range listed, please consider follow up with your Primary Care Provider.  If you are age 39 or younger, your body mass index should be between 19-25. Your Body mass index is 27.55 kg/m. If this is out of the aformentioned range listed, please consider follow up with your Primary Care Provider.   Continue Prednisone 40 mg until your appointment in November  Continue with process for starting Stelara   Thank you for choosing me and [facility].  Pricilla Riffle. Dagoberto Ligas., MD., Marval Regal

## 2020-07-16 NOTE — Progress Notes (Signed)
    History of Present Illness: This is a 59 year old female returning following hospitalization for Crohn's flare, partial SBO.  She had been maintained on Humira and was doing well until very recently.  CTAP on August 25 showed increasing chronic distention of small bowel in the setting of fistula and stricture in the distal ileum enteroenteric and enterocolonic fistulization were noted.  Persistent signs of inflammation were noted.  No abscess noted.  Gallbladder distention was noted.  She was treated with bowel rest and intravenous Solu-Medrol.  She was not placed on antibiotics.  Surgery was consulted.  Her symptoms resolved rapidly.  She has done well on oral prednisone tapered from 60 mg to 40 mg since discharge and has no significant gastrointestinal complaints she is having a bowel movement daily or every other day.  She is eating bland low residue diet without difficulty.  Humira trough drug level was low and Humira antibodies were negative. After discussion with a few colleagues Delsa Grana is recommended which is in the process of being started.    Current Medications, Allergies, Past Medical History, Past Surgical History, Family History and Social History were reviewed in [employer] record.   Physical Exam: General: Well developed, well nourished, no acute distress Head: Normocephalic and atraumatic Eyes:  sclerae anicteric, EOMI Ears: Normal auditory acuity Mouth: Not examined, mask on during Covid-19 pandemic Lungs: Clear throughout to auscultation Heart: Regular rate and rhythm; no murmurs, rubs or bruits Abdomen: Soft, non tender and non distended. No masses, hepatosplenomegaly or hernias noted. Normal Bowel sounds Rectal: Not done Musculoskeletal: Symmetrical with no gross deformities  Pulses:  Normal pulses noted Extremities: No clubbing, cyanosis, edema or deformities noted Neurological: Alert oriented x 4, grossly nonfocal Psychological:  Alert and  cooperative. Normal mood and affect   Assessment and Recommendations:  1. Crohn's ileitis with ileal stricture, enteroenteric and enterocolonic fistula recently hospitalized with a partial small bowel obstruction.  She has had dramatic improvement on intravenous Solu-Medrol and then oral prednisone.  She was not treated with antibiotics.  Discontinued Humira and her beginning Stelara with a standard infusion followed by subcutaneous dosing.  Surgical management may prove to be necessary if she does not have resolution of fistulas partial bowel obstruction or develops other complications.  REV in 6 weeks.

## 2020-07-20 MED ORDER — PREDNISONE 20 MG PO TABS: ORAL_TABLET | ORAL | 3 refills | Status: DC

## 2020-07-20 NOTE — Telephone Encounter (Signed)
===  View-only below this line=== ----- Message ----- From: Alfredia Ferguson, PA-C Sent: 07/20/2020   5:08 PM EDT To: Larina Bras, CMA Subject: FW: Prednisone                                 Please send Rx for prednisone 40 mg po daily #60/3 refills ----- Message ----- From: Aleatha Borer, CMA Sent: 07/20/2020   1:08 PM EDT To: Alfredia Ferguson, PA-C Subject: FW: Prednisone                                  ----- Message ----- From: Harrold Donath Sent: 07/20/2020  12:07 PM EDT To: [facility] Subject: Prednisone                                     Amy I was just in to see Dr Fuller Plan and told him that I would need my prednisone refilled. I've tried to call the [facility] on [address] and they're telling me that you will not refill it without a appointment on file. I need this prednisone refilled or I'm not going to have any to take through the weekend. Is this possible?  Ivin Booty

## 2020-07-20 NOTE — Telephone Encounter (Signed)
Appeal approved see P.A. telephone call

## 2020-07-20 NOTE — Telephone Encounter (Signed)
Appeal approved til 07/18/21, called pharmacy went thru for 30 day fill,  Pt informed

## 2020-07-26 ENCOUNTER — Other Ambulatory Visit: Payer: Self-pay | Admitting: Family Medicine

## 2020-07-26 DIAGNOSIS — G43909 Migraine, unspecified, not intractable, without status migrainosus: Secondary | ICD-10-CM

## 2020-07-26 DIAGNOSIS — F341 Dysthymic disorder: Secondary | ICD-10-CM

## 2020-07-26 NOTE — Telephone Encounter (Signed)
[facility] is requesting to fill pt venlafaxine . Please advise [facility]

## 2020-08-03 ENCOUNTER — Encounter: Payer: Self-pay | Admitting: Family Medicine

## 2020-08-03 ENCOUNTER — Other Ambulatory Visit: Payer: Self-pay

## 2020-08-03 ENCOUNTER — Ambulatory Visit (INDEPENDENT_AMBULATORY_CARE_PROVIDER_SITE_OTHER): Payer: 59 | Admitting: Family Medicine

## 2020-08-03 VITALS — BP 144/92 | HR 90 | Temp 98.2°F | Wt 168.4 lb

## 2020-08-03 DIAGNOSIS — I10 Essential (primary) hypertension: Secondary | ICD-10-CM | POA: Diagnosis not present

## 2020-08-03 DIAGNOSIS — K50012 Crohn's disease of small intestine with intestinal obstruction: Secondary | ICD-10-CM

## 2020-08-03 DIAGNOSIS — K0889 Other specified disorders of teeth and supporting structures: Secondary | ICD-10-CM

## 2020-08-03 DIAGNOSIS — F17200 Nicotine dependence, unspecified, uncomplicated: Secondary | ICD-10-CM | POA: Diagnosis not present

## 2020-08-03 DIAGNOSIS — N760 Acute vaginitis: Secondary | ICD-10-CM

## 2020-08-03 DIAGNOSIS — E876 Hypokalemia: Secondary | ICD-10-CM

## 2020-08-03 DIAGNOSIS — Z23 Encounter for immunization: Secondary | ICD-10-CM

## 2020-08-03 MED ORDER — LOSARTAN POTASSIUM-HCTZ 100-12.5 MG PO TABS: 1.0000 | ORAL_TABLET | Freq: Every day | ORAL | 3 refills | Status: DC

## 2020-08-03 MED ORDER — AMOXICILLIN 875 MG PO TABS: 875.0000 mg | ORAL_TABLET | Freq: Two times a day (BID) | ORAL | 0 refills | Status: DC

## 2020-08-03 MED ORDER — FLUCONAZOLE 150 MG PO TABS: 150.0000 mg | ORAL_TABLET | ORAL | 0 refills | Status: DC

## 2020-08-03 NOTE — Addendum Note (Signed)
Addended by: Elyse Jarvis on: 08/03/2020 03:36 PM   Modules accepted: Orders

## 2020-08-03 NOTE — Progress Notes (Signed)
   Subjective:    Patient ID: Cynthia [name], female    DOB: [birth year], 59 y.o.   MRN: [id number]  HPI She is here for follow-up concerning multiple issues.  She was recently hospitalized and treated for complications from Crohn's disease.  She was on Humira and is now can be placed on Stelara.  She did have difficulty with an obstruction but this cleared not requiring surgery.  She has also been having difficulty with her blood pressure and has been taking 100/25 of Hyzaar.  She did have some difficulty with low potassium and needs follow-up on that.  She is also had some trouble with vaginitis while in the hospital and was given 1 dose of Diflucan but still having some vaginal itching from that.  Presently she is having no abdominal pain, nausea or vomiting.  She has had some difficulty with tooth pain and 2 particular teeth and thinks she has an infection.  Apparently she cannot start any new Stelara until she gets the infection cleared. She does continue to smoke.  Not quite ready to quit. Review of Systems     Objective:   Physical Exam Alert and in no distress.  Exam of her teeth does show some slight erythema on several of her molars.  No drainage is noted.  The medical record including hospital discharge summary as well as emergency room notes was evaluated.     Assessment & Plan:  Crohn's ileitis, with intestinal obstruction ([place])  Current smoker  Primary hypertension - Plan: losartan-hydrochlorothiazide (HYZAAR) 100-12.5 MG tablet, Comprehensive metabolic panel  Acute vaginitis - Plan: fluconazole (DIFLUCAN) 150 MG tablet  Tooth ache - Plan: amoxicillin (AMOXIL) 875 MG tablet  Hypokalemia - Plan: Comprehensive metabolic panel At this time she seems to doing fairly well with her Crohn's.  She is not interested in quitting smoking.  I will change her blood pressure medicine around slightly to reduce the possibility of hypokalemia.  Diflucan given instructions on proper use of  that.  We will also give her Amoxil to help with the tooth ache and if no improvement, will need to refer to a dentist.

## 2020-08-04 LAB — COMPREHENSIVE METABOLIC PANEL
ALT: 8 IU/L (ref 0–32)
AST: 9 IU/L (ref 0–40)
Albumin/Globulin Ratio: 1.3 (ref 1.2–2.2)
Albumin: 3.7 g/dL — ABNORMAL LOW (ref 3.8–4.9)
Alkaline Phosphatase: 76 IU/L (ref 44–121)
BUN/Creatinine Ratio: 20 (ref 12–28)
BUN: 17 mg/dL (ref 8–27)
Bilirubin Total: <0.2 mg/dL (ref 0.0–1.2)
CO2: 26 mmol/L (ref 20–29)
Calcium: 9.2 mg/dL (ref 8.7–10.3)
Chloride: 103 mmol/L (ref 96–106)
Creatinine, Ser: 0.86 mg/dL (ref 0.57–1.00)
GFR calc Af Amer: 85 mL/min/{1.73_m2} (ref >59)
GFR calc non Af Amer: 74 mL/min/{1.73_m2} (ref >59)
Globulin, Total: 2.9 g/dL (ref 1.5–4.5)
Glucose: 104 mg/dL — ABNORMAL HIGH (ref 65–99)
Potassium: 3.8 mmol/L (ref 3.5–5.2)
Sodium: 142 mmol/L (ref 134–144)
Total Protein: 6.6 g/dL (ref 6.0–8.5)

## 2020-08-07 ENCOUNTER — Ambulatory Visit: Payer: 59 | Admitting: Gastroenterology

## 2020-08-30 ENCOUNTER — Ambulatory Visit (INDEPENDENT_AMBULATORY_CARE_PROVIDER_SITE_OTHER): Payer: 59

## 2020-08-30 ENCOUNTER — Other Ambulatory Visit: Payer: Self-pay

## 2020-08-30 DIAGNOSIS — Z23 Encounter for immunization: Secondary | ICD-10-CM | POA: Diagnosis not present

## 2020-09-04 ENCOUNTER — Ambulatory Visit (INDEPENDENT_AMBULATORY_CARE_PROVIDER_SITE_OTHER): Payer: 59 | Admitting: Gastroenterology

## 2020-09-04 ENCOUNTER — Encounter: Payer: Self-pay | Admitting: Gastroenterology

## 2020-09-04 VITALS — BP 140/80 | HR 88 | Ht 63.75 in | Wt 169.8 lb

## 2020-09-04 DIAGNOSIS — K50019 Crohn's disease of small intestine with unspecified complications: Secondary | ICD-10-CM

## 2020-09-04 MED ORDER — PREDNISONE 10 MG PO TABS: ORAL_TABLET | ORAL | 0 refills | Status: AC

## 2020-09-04 NOTE — Patient Instructions (Signed)
We have sent the following medications to your pharmacy for you to pick up at your convenience: prednisone taper 30 mg daily x 5 days, then reduce to 20 mg daily x 5 days, then reduce to 10 mg daily x 10 days, then reduce to 10 mg every other day x 10 days.   Please follow up with Dr. Fuller Plan in 8 weeks after your next Stelara injection.   Thank you for choosing me and [facility].  Pricilla Riffle. Dagoberto Ligas., MD., Marval Regal

## 2020-09-04 NOTE — Progress Notes (Signed)
    History of Present Illness: This is a 59 year old female with Crohn's ileocolitis.  She was hospitalized in August for partial small bowel obstruction, stricture in the distal ileum, enteroenteric fistula, enterocolonic fistula.  She was treated with Humira at that time.  Symptoms improved on Solu-Medrol and oral prednisone and she was discharged home.  She received her first Stelara infusion 10/25 and relates she developed a slight rash on her trunk that was white and itchy it went away promptly with Benadryl and has not recurred.  She has felt well for the past few weeks except for today she has some mild diarrhea and nausea.  Current Medications, Allergies, Past Medical History, Past Surgical History, Family History and Social History were reviewed in [employer] record.   Physical Exam: General: Well developed, well nourished, no acute distress Head: Normocephalic and atraumatic Eyes:  sclerae anicteric, EOMI Ears: Normal auditory acuity Mouth: Not examined, mask on during Covid-19 pandemic Lungs: Clear throughout to auscultation Heart: Regular rate and rhythm; no murmurs, rubs or bruits Abdomen: Soft, non tender and non distended. No masses, hepatosplenomegaly or hernias noted. Normal Bowel sounds Rectal: Not done Musculoskeletal: Symmetrical with no gross deformities  Pulses:  Normal pulses noted Extremities: No clubbing, cyanosis, edema or deformities noted Neurological: Alert oriented x 4, grossly nonfocal Psychological:  Alert and cooperative. Normal mood and affect   Assessment and Recommendations:  1.  Crohn's ileocolitis complicated by an ileal stricture, enteroenteric fistula, enterocolonic fistula.  Mild transient rash with Stelara infusion.  Advised to take her next dose of Stelara, subcutaneous injection, at the infusion center for monitoring.  Prednisone taper as follows: 30 mg for 5 days, 20 mg for 5 days, 10 mg for 10 days and 10 mg every other  day for 10 days and then discontinue. Repeat abdominal imaging pending course. Call us after your Stelara injection. REV in 8 weeks.

## 2020-10-06 ENCOUNTER — Other Ambulatory Visit: Payer: Self-pay | Admitting: Gastroenterology

## 2020-10-24 ENCOUNTER — Ambulatory Visit (INDEPENDENT_AMBULATORY_CARE_PROVIDER_SITE_OTHER): Payer: 59

## 2020-10-24 ENCOUNTER — Other Ambulatory Visit: Payer: Self-pay

## 2020-10-24 DIAGNOSIS — Z23 Encounter for immunization: Secondary | ICD-10-CM

## 2020-11-01 ENCOUNTER — Other Ambulatory Visit: Payer: Self-pay | Admitting: Gastroenterology

## 2020-11-01 NOTE — Telephone Encounter (Signed)
I spoke with Cynthia [name] who was last seen 09/04/2020 and told to taper her Prednisone. She said she thought she was supposed to be on 4m daily which she has been doing until lately when her supply has been low she was taking it sporadically. I made her a follow up appointment for 12/25/2020. I wanted you to be aware of this Sir.

## 2020-11-04 NOTE — Telephone Encounter (Signed)
Please review with her to follow this prednisone taper to completely discontinue.  Prednisone 20 mg po qd for 5 days, 10 mg po qd for 5 days and 10 mg po every other day for 10 days and then discontinue.

## 2020-11-05 MED ORDER — PREDNISONE 10 MG PO TABS: ORAL_TABLET | ORAL | 0 refills | Status: AC

## 2020-11-05 NOTE — Addendum Note (Signed)
Addended by: [name] on: 11/05/2020 10:48 AM   Modules accepted: Orders

## 2020-11-05 NOTE — Telephone Encounter (Signed)
Patient notified

## 2020-11-21 ENCOUNTER — Other Ambulatory Visit: Payer: Self-pay | Admitting: Physician Assistant

## 2020-11-27 ENCOUNTER — Encounter: Payer: Self-pay | Admitting: Medical

## 2020-11-27 ENCOUNTER — Telehealth (INDEPENDENT_AMBULATORY_CARE_PROVIDER_SITE_OTHER): Payer: 59 | Admitting: Medical

## 2020-11-27 ENCOUNTER — Other Ambulatory Visit: Payer: Self-pay

## 2020-11-27 ENCOUNTER — Other Ambulatory Visit (INDEPENDENT_AMBULATORY_CARE_PROVIDER_SITE_OTHER): Payer: 59

## 2020-11-27 VITALS — Ht 63.0 in | Wt 145.0 lb

## 2020-11-27 DIAGNOSIS — H9203 Otalgia, bilateral: Secondary | ICD-10-CM | POA: Insufficient documentation

## 2020-11-27 DIAGNOSIS — R059 Cough, unspecified: Secondary | ICD-10-CM | POA: Insufficient documentation

## 2020-11-27 DIAGNOSIS — R52 Pain, unspecified: Secondary | ICD-10-CM | POA: Insufficient documentation

## 2020-11-27 DIAGNOSIS — Z79899 Other long term (current) drug therapy: Secondary | ICD-10-CM

## 2020-11-27 DIAGNOSIS — K50919 Crohn's disease, unspecified, with unspecified complications: Secondary | ICD-10-CM

## 2020-11-27 DIAGNOSIS — R509 Fever, unspecified: Secondary | ICD-10-CM | POA: Insufficient documentation

## 2020-11-27 LAB — POC COVID19 BINAXNOW: SARS Coronavirus 2 Ag: NEGATIVE

## 2020-11-27 MED ORDER — AZITHROMYCIN 250 MG PO TABS: 250.0000 mg | ORAL_TABLET | Freq: Two times a day (BID) | ORAL | 0 refills | Status: DC

## 2020-11-27 MED ORDER — EMERGEN-C IMMUNE PLUS PO PACK
1.0000 | PACK | Freq: Two times a day (BID) | ORAL | 0 refills | Status: DC
Start: 2020-11-27 — End: 2020-12-04

## 2020-11-27 NOTE — Addendum Note (Signed)
Addended by: Carlena Hurl on: 11/27/2020 11:36 AM   Modules accepted: Orders

## 2020-11-27 NOTE — Progress Notes (Addendum)
Subjective:     Patient ID: Cynthia [name], female   DOB: [birth year], 60 y.o.   MRN: [id number]  This visit type was conducted due to national recommendations for restrictions regarding the COVID-19 Pandemic (e.g. social distancing) in an effort to limit this patient's exposure and mitigate transmission in our community.  Due to their co-morbid illnesses, this patient is at least at moderate risk for complications without adequate follow up.  This format is felt to be most appropriate for this patient at this time.    Documentation for virtual audio and video telecommunications through [place] encounter:  The patient was located at home. The provider was located in the office. The patient did consent to this visit and is aware of possible charges through their insurance for this visit.  The other persons participating in this telemedicine service were none. Time spent on call was 20 minutes and in review of previous records 20 minutes total.  This virtual service is not related to other E/M service within previous 7 days.   HPI Chief Complaint  Patient presents with  . covid symptoms     Head congestion, left ear block, cough, post nasal drainage, congestion, scratchy throat. Symptoms started x1 week ago. Headache    Medical team: Dr. Lucio Edward, GI Denita Lung, MD here for primary care  Virtual consult for illness.  She has a history of Crohn's disease and currently has a Crohn's flareup.  She is currently on prednisone for this, Phenergan for nausea.  Having constant diarrhea.  In addition to her Crohn's flareup for the last week she has had some respiratory symptoms including head congestion, chest congestion, cough, postnasal drainage, headache, ear discomfort, scratchy throat.  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  She would like to have a test.  She has had both Covid shots initially and 2 boosters at this point given her  high risk medication and underlying health issues  Not currently taking anything for her symptoms of respiratory symptoms  Her last hospitalization for Crohn's was August 2021   Past Medical History:  Diagnosis Date  . Anemia 2012  . Anxiety   . Bronchitis   . Crohn's ileitis ([place])   . Depression   . GERD (gastroesophageal reflux disease)   . Headache(784.0)    MIGRAINE  . Heart murmur   . Hiatal hernia   . Hypertension   . IBS (irritable bowel syndrome)   . Incontinence   . MVP (mitral valve prolapse)   . RLS (restless legs syndrome)   . Tubular adenoma of colon 08/2019    Review of Systems As in subjective    Objective:   Physical Exam Due to coronavirus pandemic stay at home measures, patient visit was virtual and they were not examined in person.   Height 5' 3"  (1.6 m), weight 145 lb (65.8 kg), last menstrual period 06/10/2011.  Wt Readings from Last 3 Encounters:  11/27/20 145 lb (65.8 kg)  09/04/20 169 lb 12.8 oz (77 kg)  08/03/20 168 lb 6.4 oz (76.4 kg)   Gen: wd, wn, nad, ill appearing Coughing some, no obvious wheezing or short of breath Ask questions in complete sentences without labored breathing     Assessment:     Encounter Diagnoses  Name Primary?  . Cough Yes  . Otalgia of both ears   . Body aches   . Crohn's disease with complication, unspecified gastrointestinal tract location ([place])   .  Fever, unspecified fever cause   . High risk medication use        Plan:     We discussed her symptoms and concerns.  She will come in for Covid screen today.    In general discussed hydration, rest, Tylenol for pain and fever, supportive care for symptoms including salt water gargles, can use Mucinex DM or Coricidin HBP for congestion and cough.  Begin medicaiton below for possible sinus/ear infection as well as to reduce inflammation in the event of covid infection.  Continue medicine as per GI for Crohn's flare including promethazine for nausea and  vomiting, prednisone taper she is still taking as well as her routine medications.  I advise she cut her losartan HCT in half for about 3 days as she may be a little dehydrated.  She will focus on rehydration  Discussed isolation and quarantine.   [name] was seen today for covid symptoms .  Diagnoses and all orders for this visit:  Cough  Otalgia of both ears  Body aches  Crohn's disease with complication, unspecified gastrointestinal tract location (HCC)  Fever, unspecified fever cause  High risk medication use  Other orders -     Multiple Vitamins-Minerals (EMERGEN-C IMMUNE PLUS) PACK; Take 1 tablet by mouth 2 (two) times daily. -     azithromycin (ZITHROMAX) 250 MG tablet; Take 1 tablet (250 mg total) by mouth in the morning and at bedtime.   F/u today for covid screening in our back parking lot

## 2020-11-29 LAB — NOVEL CORONAVIRUS, NAA: SARS-CoV-2, NAA: NOT DETECTED

## 2020-11-29 LAB — SARS-COV-2, NAA 2 DAY TAT

## 2020-12-03 ENCOUNTER — Telehealth: Payer: Self-pay | Admitting: Gastroenterology

## 2020-12-03 NOTE — Telephone Encounter (Signed)
Left message for patient to call back. I put her on for OV with Ellouise Newer, PA on 2/1 at 3 :30.  I asked that she call back to confirm.

## 2020-12-04 ENCOUNTER — Other Ambulatory Visit: Payer: Self-pay

## 2020-12-04 ENCOUNTER — Encounter (HOSPITAL_COMMUNITY): Payer: Self-pay

## 2020-12-04 ENCOUNTER — Emergency Department (HOSPITAL_COMMUNITY): Payer: 59

## 2020-12-04 ENCOUNTER — Inpatient Hospital Stay (HOSPITAL_COMMUNITY)
Admission: EM | Admit: 2020-12-04 | Discharge: 2020-12-09 | DRG: 389 | Disposition: A | Discharge status: Home / Self Care | Payer: 59 | Attending: Family Medicine | Admitting: Family Medicine

## 2020-12-04 ENCOUNTER — Ambulatory Visit (INDEPENDENT_AMBULATORY_CARE_PROVIDER_SITE_OTHER): Payer: 59 | Admitting: Physician Assistant

## 2020-12-04 ENCOUNTER — Inpatient Hospital Stay (HOSPITAL_COMMUNITY): Payer: 59

## 2020-12-04 ENCOUNTER — Encounter: Payer: Self-pay | Admitting: Physician Assistant

## 2020-12-04 VITALS — BP 100/82 | HR 120 | Ht 63.0 in | Wt 140.6 lb

## 2020-12-04 DIAGNOSIS — D75839 Thrombocytosis, unspecified: Secondary | ICD-10-CM | POA: Diagnosis present

## 2020-12-04 DIAGNOSIS — F17210 Nicotine dependence, cigarettes, uncomplicated: Secondary | ICD-10-CM | POA: Diagnosis present

## 2020-12-04 DIAGNOSIS — Z888 Allergy status to other drugs, medicaments and biological substances status: Secondary | ICD-10-CM

## 2020-12-04 DIAGNOSIS — F32A Depression, unspecified: Secondary | ICD-10-CM | POA: Diagnosis present

## 2020-12-04 DIAGNOSIS — N3000 Acute cystitis without hematuria: Secondary | ICD-10-CM

## 2020-12-04 DIAGNOSIS — B9620 Unspecified Escherichia coli [E. coli] as the cause of diseases classified elsewhere: Secondary | ICD-10-CM | POA: Diagnosis present

## 2020-12-04 DIAGNOSIS — E876 Hypokalemia: Secondary | ICD-10-CM | POA: Diagnosis present

## 2020-12-04 DIAGNOSIS — K5651 Intestinal adhesions [bands], with partial obstruction: Secondary | ICD-10-CM | POA: Diagnosis present

## 2020-12-04 DIAGNOSIS — K50019 Crohn's disease of small intestine with unspecified complications: Secondary | ICD-10-CM | POA: Diagnosis not present

## 2020-12-04 DIAGNOSIS — R1915 Other abnormal bowel sounds: Secondary | ICD-10-CM

## 2020-12-04 DIAGNOSIS — K50119 Crohn's disease of large intestine with unspecified complications: Secondary | ICD-10-CM | POA: Diagnosis not present

## 2020-12-04 DIAGNOSIS — H9209 Otalgia, unspecified ear: Secondary | ICD-10-CM | POA: Diagnosis not present

## 2020-12-04 DIAGNOSIS — Z20822 Contact with and (suspected) exposure to covid-19: Secondary | ICD-10-CM | POA: Diagnosis present

## 2020-12-04 DIAGNOSIS — K50012 Crohn's disease of small intestine with intestinal obstruction: Secondary | ICD-10-CM | POA: Diagnosis not present

## 2020-12-04 DIAGNOSIS — I10 Essential (primary) hypertension: Secondary | ICD-10-CM | POA: Diagnosis present

## 2020-12-04 DIAGNOSIS — R1084 Generalized abdominal pain: Secondary | ICD-10-CM | POA: Diagnosis not present

## 2020-12-04 DIAGNOSIS — Z79899 Other long term (current) drug therapy: Secondary | ICD-10-CM | POA: Diagnosis not present

## 2020-12-04 DIAGNOSIS — K5080 Crohn's disease of both small and large intestine without complications: Secondary | ICD-10-CM | POA: Diagnosis present

## 2020-12-04 DIAGNOSIS — I341 Nonrheumatic mitral (valve) prolapse: Secondary | ICD-10-CM | POA: Diagnosis present

## 2020-12-04 DIAGNOSIS — F419 Anxiety disorder, unspecified: Secondary | ICD-10-CM | POA: Diagnosis present

## 2020-12-04 DIAGNOSIS — K56609 Unspecified intestinal obstruction, unspecified as to partial versus complete obstruction: Secondary | ICD-10-CM | POA: Diagnosis present

## 2020-12-04 DIAGNOSIS — K219 Gastro-esophageal reflux disease without esophagitis: Secondary | ICD-10-CM | POA: Diagnosis present

## 2020-12-04 DIAGNOSIS — K50919 Crohn's disease, unspecified, with unspecified complications: Secondary | ICD-10-CM | POA: Diagnosis present

## 2020-12-04 LAB — URINALYSIS, ROUTINE W REFLEX MICROSCOPIC
Bilirubin Urine: NEGATIVE
Glucose, UA: NEGATIVE mg/dL
Hgb urine dipstick: NEGATIVE
Ketones, ur: 5 mg/dL — AB
Nitrite: POSITIVE — AB
Protein, ur: 30 mg/dL — AB
Specific Gravity, Urine: 1.031 — ABNORMAL HIGH (ref 1.005–1.030)
pH: 5 (ref 5.0–8.0)

## 2020-12-04 LAB — COMPREHENSIVE METABOLIC PANEL
ALT: 24 U/L (ref 0–44)
AST: 30 U/L (ref 15–41)
Albumin: 3.4 g/dL — ABNORMAL LOW (ref 3.5–5.0)
Alkaline Phosphatase: 90 U/L (ref 38–126)
Anion gap: 15 (ref 5–15)
BUN: 23 mg/dL (ref 8–23)
CO2: 24 mmol/L (ref 22–32)
Calcium: 8.7 mg/dL — ABNORMAL LOW (ref 8.9–10.3)
Chloride: 95 mmol/L — ABNORMAL LOW (ref 98–111)
Creatinine, Ser: 1.01 mg/dL — ABNORMAL HIGH (ref 0.44–1.00)
GFR, Estimated: >60 mL/min (ref >60)
Glucose, Bld: 144 mg/dL — ABNORMAL HIGH (ref 70–99)
Potassium: 3.4 mmol/L — ABNORMAL LOW (ref 3.5–5.1)
Sodium: 134 mmol/L — ABNORMAL LOW (ref 135–145)
Total Bilirubin: 0.9 mg/dL (ref 0.3–1.2)
Total Protein: 8.1 g/dL (ref 6.5–8.1)

## 2020-12-04 LAB — CBC
HCT: 51.5 % — ABNORMAL HIGH (ref 36.0–46.0)
Hemoglobin: 16.8 g/dL — ABNORMAL HIGH (ref 12.0–15.0)
MCH: 28.9 pg (ref 26.0–34.0)
MCHC: 32.6 g/dL (ref 30.0–36.0)
MCV: 88.5 fL (ref 80.0–100.0)
Platelets: 624 10*3/uL — ABNORMAL HIGH (ref 150–400)
RBC: 5.82 MIL/uL — ABNORMAL HIGH (ref 3.87–5.11)
RDW: 12.7 % (ref 11.5–15.5)
WBC: 17.6 10*3/uL — ABNORMAL HIGH (ref 4.0–10.5)
nRBC: 0 % (ref 0.0–0.2)

## 2020-12-04 LAB — LIPASE, BLOOD: Lipase: 20 U/L (ref 11–51)

## 2020-12-04 MED ORDER — SODIUM CHLORIDE 0.9 % IV SOLN
1.0000 g | Freq: Once | INTRAVENOUS | Status: AC
  Administered 2020-12-04: 1 g via INTRAVENOUS
  Filled 2020-12-04: qty 10

## 2020-12-04 MED ORDER — ONDANSETRON HCL 4 MG/2ML IJ SOLN
4.0000 mg | Freq: Once | INTRAMUSCULAR | Status: AC
  Administered 2020-12-04: 4 mg via INTRAVENOUS
  Filled 2020-12-04: qty 2

## 2020-12-04 MED ORDER — ONDANSETRON HCL 4 MG PO TABS: 4.0000 mg | ORAL_TABLET | Freq: Four times a day (QID) | ORAL | Status: DC | PRN

## 2020-12-04 MED ORDER — MORPHINE SULFATE (PF) 4 MG/ML IV SOLN
4.0000 mg | Freq: Once | INTRAVENOUS | Status: AC
  Administered 2020-12-04: 4 mg via INTRAVENOUS
  Filled 2020-12-04: qty 1

## 2020-12-04 MED ORDER — IOHEXOL 300 MG/ML  SOLN
100.0000 mL | Freq: Once | INTRAMUSCULAR | Status: AC | PRN
  Administered 2020-12-04: 100 mL via INTRAVENOUS

## 2020-12-04 MED ORDER — ONDANSETRON HCL 4 MG/2ML IJ SOLN
4.0000 mg | Freq: Four times a day (QID) | INTRAMUSCULAR | Status: DC | PRN
  Administered 2020-12-07: 4 mg via INTRAVENOUS
  Filled 2020-12-04: qty 2

## 2020-12-04 MED ORDER — POTASSIUM CHLORIDE IN NACL 40-0.9 MEQ/L-% IV SOLN
INTRAVENOUS | Status: DC
  Filled 2020-12-04 (×4): qty 1000

## 2020-12-04 MED ORDER — MORPHINE SULFATE (PF) 2 MG/ML IV SOLN
2.0000 mg | INTRAVENOUS | Status: DC | PRN
  Administered 2020-12-04 – 2020-12-07 (×5): 2 mg via INTRAVENOUS
  Filled 2020-12-04 (×5): qty 1

## 2020-12-04 MED ORDER — POTASSIUM CHLORIDE 2 MEQ/ML IV SOLN: INTRAVENOUS | Status: DC

## 2020-12-04 MED ORDER — SODIUM CHLORIDE 0.9 % IV BOLUS
1000.0000 mL | Freq: Once | INTRAVENOUS | Status: AC
  Administered 2020-12-04: 1000 mL via INTRAVENOUS

## 2020-12-04 MED ORDER — ENOXAPARIN SODIUM 40 MG/0.4ML ~~LOC~~ SOLN
40.0000 mg | SUBCUTANEOUS | Status: DC
  Administered 2020-12-05 – 2020-12-08 (×5): 40 mg via SUBCUTANEOUS
  Filled 2020-12-04 (×5): qty 0.4

## 2020-12-04 NOTE — ED Triage Notes (Signed)
Pt arrived via walk in, c/o n/v x1 week, diarrhea as well that stopped x3 days ago, unable to move bowel since. Hx of SBO, states this feels similar.

## 2020-12-04 NOTE — ED Notes (Signed)
Patient transported to CT 

## 2020-12-04 NOTE — Patient Instructions (Signed)
If you are age 62 or older, your body mass index should be between 23-30. Your Body mass index is 24.91 kg/m. If this is out of the aforementioned range listed, please consider follow up with your Primary Care Provider.  If you are age 50 or younger, your body mass index should be between 19-25. Your Body mass index is 24.91 kg/m. If this is out of the aformentioned range listed, please consider follow up with your Primary Care Provider.   Go to Emergency Room.  Thank you for choosing me and [facility].  Ellouise Newer, PA-C

## 2020-12-04 NOTE — Progress Notes (Signed)
Reviewed and agree with management plan. Presentation is concerning for SBO in setting of Crohn's ileocolitis with a history of a distal ileal stricture and enteroenteric and enterocolonic fistulization maintained on Stelara and Prednisone. Previously she failed Humira. Prednisone was supposed to tapered to discontinue by early December. Urgent ED evaluation to initially include CT AP and blood work.   Pricilla Riffle. Fuller Plan, MD FACG [phone]

## 2020-12-04 NOTE — ED Notes (Signed)
ED TO INPATIENT HANDOFF REPORT  Name/Age/Gender Cynthia [name] 60 y.o. female  Code Status    Code Status Orders  (From admission, onward)         Start     Ordered   12/04/20 2303  Full code  Continuous        12/04/20 2303        Code Status History    Date Active Date Inactive Code Status Order ID Comments User Context   06/27/2020 1218 07/03/2020 1848 Full Code [id number]  Donne Hazel, MD ED   08/26/2019 0410 08/29/2019 2106 Full Code [id number]  Rise Patience, MD ED   Advance Care Planning Activity      Home/SNF/Other Home  Chief Complaint Small bowel obstruction [facility]) [L57.262]  Level of Care/Admitting Diagnosis ED Disposition    ED Disposition Condition [facility] Area: [facility] [[id number]]  Level of Care: Med-Surg [16]  May admit patient to [facility] or [facility] if equivalent level of care is available:: No  Covid Evaluation: Asymptomatic Screening Protocol (No Symptoms)  Diagnosis: Small bowel obstruction [facility]) [[id number]]  Admitting Physician: Lenore Cordia [[id number]]  Attending Physician: Lenore Cordia [[id number]]  Estimated length of stay: past midnight tomorrow  Certification:: I certify this patient will need inpatient services for at least 2 midnights       Medical History Past Medical History:  Diagnosis Date  . Anemia 2012  . Anxiety   . Bronchitis   . Crohn's ileitis ([place])   . Depression   . GERD (gastroesophageal reflux disease)   . Headache(784.0)    MIGRAINE  . Heart murmur   . Hiatal hernia   . Hypertension   . IBS (irritable bowel syndrome)   . Incontinence   . MVP (mitral valve prolapse)   . RLS (restless legs syndrome)   . Tubular adenoma of colon 08/2019    Allergies Allergies  Allergen Reactions  . Iodine     REACTION: in eye gtts Iodine eye drops; pt has no reactions to CT contrast    IV Location/Drains/Wounds Patient Lines/Drains/Airways Status    Active  Line/Drains/Airways    Name Placement date Placement time Site Days   Peripheral IV 12/04/20 Left;Posterior;Distal Forearm 12/04/20  1926  Forearm  less than 1   NG/OG Tube Nasogastric 16 Fr. Right nare Aucultation;Xray 12/04/20  2253  Right nare  less than 1   Incision 06/17/11 Abdomen Other (Comment) 06/17/11  0854  -- 3458   Incision 02/25/12 Perineum Other (Comment) 02/25/12  1128  -- 3205          Labs/Imaging Results for orders placed or performed during the hospital encounter of 12/04/20 (from the past 48 hour(s))  Lipase, blood     Status: None   Collection Time: 12/04/20  5:25 PM  Result Value Ref Range   Lipase 20 11 - 51 U/L    Comment: Performed at [facility], [place] [address] [place], [place] [address]  Comprehensive metabolic panel     Status: Abnormal   Collection Time: 12/04/20  5:25 PM  Result Value Ref Range   Sodium 134 (L) 135 - 145 mmol/L   Potassium 3.4 (L) 3.5 - 5.1 mmol/L   Chloride 95 (L) 98 - 111 mmol/L   CO2 24 22 - 32 mmol/L   Glucose, Bld 144 (H) 70 - 99 mg/dL    Comment: Glucose reference range applies only to samples taken after  fasting for at least 8 hours.   BUN 23 8 - 23 mg/dL   Creatinine, Ser 1.01 (H) 0.44 - 1.00 mg/dL   Calcium 8.7 (L) 8.9 - 10.3 mg/dL   Total Protein 8.1 6.5 - 8.1 g/dL   Albumin 3.4 (L) 3.5 - 5.0 g/dL   AST 30 15 - 41 U/L   ALT 24 0 - 44 U/L   Alkaline Phosphatase 90 38 - 126 U/L   Total Bilirubin 0.9 0.3 - 1.2 mg/dL   GFR, Estimated >60 >60 mL/min    Comment: (NOTE) Calculated using the CKD-EPI Creatinine Equation (2021)    Anion gap 15 5 - 15    Comment: Performed at [facility], [place] [address] [place], [place] [address]  CBC     Status: Abnormal   Collection Time: 12/04/20  5:25 PM  Result Value Ref Range   WBC 17.6 (H) 4.0 - 10.5 K/uL   RBC 5.82 (H) 3.87 - 5.11 MIL/uL   Hemoglobin 16.8 (H) 12.0 - 15.0 g/dL   HCT 51.5 (H) 36.0 - 46.0 %   MCV 88.5 80.0 - 100.0 fL   MCH  28.9 26.0 - 34.0 pg   MCHC 32.6 30.0 - 36.0 g/dL   RDW 12.7 11.5 - 15.5 %   Platelets 624 (H) 150 - 400 K/uL   nRBC 0.0 0.0 - 0.2 %    Comment: Performed at [facility], [place] [address], [place], [place] [address]  Urinalysis, Routine w reflex microscopic Urine, Clean Catch     Status: Abnormal   Collection Time: 12/04/20  5:27 PM  Result Value Ref Range   Color, Urine AMBER (A) YELLOW    Comment: BIOCHEMICALS MAY BE AFFECTED BY COLOR   APPearance CLOUDY (A) CLEAR   Specific Gravity, Urine 1.031 (H) 1.005 - 1.030   pH 5.0 5.0 - 8.0   Glucose, UA NEGATIVE NEGATIVE mg/dL   Hgb urine dipstick NEGATIVE NEGATIVE   Bilirubin Urine NEGATIVE NEGATIVE   Ketones, ur 5 (A) NEGATIVE mg/dL   Protein, ur 30 (A) NEGATIVE mg/dL   Nitrite POSITIVE (A) NEGATIVE   Leukocytes,Ua LARGE (A) NEGATIVE   RBC / HPF 6-10 0 - 5 RBC/hpf   WBC, UA 21-50 0 - 5 WBC/hpf   Bacteria, UA MANY (A) NONE SEEN   Squamous Epithelial / LPF 11-20 0 - 5   Mucus PRESENT    Hyaline Casts, UA PRESENT     Comment: Performed at [facility], [place] [address] [place], [place] [address]   DG Abdomen 1 View  Result Date: 12/04/2020 CLINICAL DATA:  Check gastric catheter placement EXAM: ABDOMEN - 1 VIEW COMPARISON:  CT from earlier in the same day. FINDINGS: Gastric catheter is noted with the tip at the gastroesophageal junction. Proximal side port lies in the distal esophagus. This should be advanced several cm deeper into the stomach. Persistent small bowel dilatation is noted consistent with that seen on recent CT examination. IMPRESSION: Gastric catheter as described which should be advanced several cm deeper into the stomach. Persistent small bowel obstruction. Electronically Signed   By: Inez Catalina M.D.   On: 12/04/2020 23:19   CT ABDOMEN PELVIS W CONTRAST  Result Date: 12/04/2020 CLINICAL DATA:  60 year old female with abdominal pain. EXAM: CT ABDOMEN AND PELVIS WITH CONTRAST TECHNIQUE:  Multidetector CT imaging of the abdomen and pelvis was performed using the standard protocol following bolus administration of intravenous contrast. CONTRAST:  155m OMNIPAQUE IOHEXOL 300 MG/ML  SOLN COMPARISON:  CT abdomen pelvis dated 06/27/2020. FINDINGS: Lower chest: The visualized lung bases are clear. No intra-abdominal free air.  Small free fluid in the pelvis. Hepatobiliary: The liver is unremarkable. No intrahepatic biliary dilatation. The gallbladder is mildly distended. No calcified gallstone or pericholecystic fluid. Pancreas: Unremarkable. No pancreatic ductal dilatation or surrounding inflammatory changes. Spleen: Normal in size without focal abnormality. Adrenals/Urinary Tract: The adrenal glands unremarkable. There is no hydronephrosis on either side. There is symmetric enhancement and excretion of contrast by both kidneys. The visualized ureters and urinary bladder appear unremarkable. Stomach/Bowel: There is diffuse dilatation of small bowel measuring up to 5.5 cm in caliber. There is a transition in the distal ileum within the pelvis were there is mild thickening of the bowel loops and severe narrowing of the bowel lumen consistent with stricture. There is a transition within the pelvis (85/5 and 67/2). There is tethering of adjacent small bowel loops in this region consistent with adhesions. An enteroenteric fistula is not excluded. The colon is unremarkable. The appendix is normal. Vascular/Lymphatic: The abdominal aorta and IVC unremarkable. No portal venous gas. No adenopathy. Reproductive: Hysterectomy.  No adnexal masses. Other: None Musculoskeletal: Degenerative changes of the spine. No acute osseous pathology. IMPRESSION: High-grade distal small-bowel obstruction with transition within the pelvis secondary to structures an adhesion. Electronically Signed   By: Anner Crete M.D.   On: 12/04/2020 20:15    Pending Labs Unresulted Labs (From admission, onward)          Start      Ordered   12/05/20 0500  HIV Antibody (routine testing w rflx)  (HIV Antibody (Routine testing w reflex) panel)  Tomorrow morning,   R        12/04/20 2303   12/05/20 0500  Magnesium  Tomorrow morning,   R        12/04/20 2303   12/05/20 0500  CBC  Tomorrow morning,   R        12/04/20 2303   12/05/20 2122  Basic metabolic panel  Tomorrow morning,   R        12/04/20 2303   12/04/20 2038  SARS CORONAVIRUS 2 (TAT 6-24 HRS) Nasopharyngeal Nasopharyngeal Swab  (Tier 3 - Symptomatic/asymptomatic with Precautions)  Once,   STAT       Question Answer Comment  Is this test for diagnosis or screening Screening   Symptomatic for COVID-19 as defined by CDC No   Hospitalized for COVID-19 No   Admitted to ICU for COVID-19 No   Previously tested for COVID-19 Yes   Resident in a congregate (group) care setting No   Employed in healthcare setting No   Pregnant No   Has patient completed COVID vaccination(s) (2 doses of Pfizer/Moderna 1 dose of [employer]) Unknown      12/04/20 2038   12/04/20 2037  Urine culture  Add-on,   AD        12/04/20 2036          Vitals/Pain Today's Vitals   12/04/20 2130 12/04/20 2200 12/04/20 2230 12/04/20 2300  BP: 103/71 (!) 124/92 124/88 (!) 137/109  Pulse: (!) 116 (!) 108 (!) 105 (!) 110  Resp: 16 13 15 15   Temp:      TempSrc:      SpO2: 93% 96% 99% 97%  Weight:      Height:      PainSc:        Isolation Precautions Airborne and Contact precautions  Medications  Medications  morphine 2 MG/ML injection 2 mg (2 mg Intravenous Given 12/04/20 2242)  enoxaparin (LOVENOX) injection 40 mg (has no administration in time range)  ondansetron (ZOFRAN) tablet 4 mg (has no administration in time range)    Or  ondansetron (ZOFRAN) injection 4 mg (has no administration in time range)  0.9 % NaCl with KCl 40 mEq / L  infusion (has no administration in time range)  sodium chloride 0.9 % bolus 1,000 mL (0 mLs Intravenous Stopped 12/04/20 2103)  ondansetron  (ZOFRAN) injection 4 mg (4 mg Intravenous Given 12/04/20 1932)  morphine 4 MG/ML injection 4 mg (4 mg Intravenous Given 12/04/20 1933)  iohexol (OMNIPAQUE) 300 MG/ML solution 100 mL (100 mLs Intravenous Contrast Given 12/04/20 1959)  cefTRIAXone (ROCEPHIN) 1 g in sodium chloride 0.9 % 100 mL IVPB (0 g Intravenous Stopped 12/04/20 2217)    Mobility walks

## 2020-12-04 NOTE — H&P (Signed)
History and Physical    Cynthia [name] GOT:[id number] DOB: 06/08/1961 DOA: 12/04/2020  PCP: Denita Lung, MD  Patient coming from: GI office  I have personally briefly reviewed patient's old medical records in [place]  Chief Complaint: Abdominal pain, nausea, vomiting  HPI: Cynthia [name] is a 60 y.o. female with medical history significant for fistulizing Crohn's ileocolitis, hypertension, and depression/anxiety who presents to the ED for evaluation of abdominal pain, nausea, and vomiting.  Patient reports approximately 1 and half weeks of progressive intermittent abdominal pain.  Over the last 3-4 days she developed nausea with vomiting and has not been able to maintain any adequate oral intake.  She says her last bowel movement was 3 days ago.  She says she is passing gas.  She has occasional chills but denies any subjective fevers, diaphoresis, chest pain, dyspnea, or dysuria.  Patient follows closely with GI, Dr. Fuller Plan and has been on Stelara.  She was seen in GI clinic earlier today and due to concern for bowel obstruction she was sent to the ED for further evaluation and management.   ED Course:  Initial vitals showed BP 114/50, pulse 130, RR 20, temp 97.9 F, SPO2 100% on room air.  Labs notable for WBC 17.6, hemoglobin 16.8, platelets 624,000, sodium 134, potassium 3.4, bicarb 24, BUN 23, creatinine 1.01, serum glucose 144, LFTs within normal limits, lipase 20.  Urinalysis shows positive nitrites, large leukocytes, 6-10 RBC/hpf, 21-50 WBC/hpf, many bacteria microscopy.  Urine culture is obtained and pending.  SARS-CoV-2 PCR is obtained and pending.  CT abdomen/pelvis with contrast shows high-grade distal small bowel obstruction with transition within the pelvis secondary to strictures and adhesions.  Patient was given 1 L normal saline, IV ceftriaxone, IV Zofran, IV morphine.  EDP discussed with on-call general surgery who recommended NG tube placement and that  they will consult in the morning.  The hospitalist service was consulted to admit for further evaluation and management.  Review of Systems: All systems reviewed and are negative except as documented in history of present illness above.   Past Medical History:  Diagnosis Date  . Anemia 2012  . Anxiety   . Bronchitis   . Crohn's ileitis ([place])   . Depression   . GERD (gastroesophageal reflux disease)   . Headache(784.0)    MIGRAINE  . Heart murmur   . Hiatal hernia   . Hypertension   . IBS (irritable bowel syndrome)   . Incontinence   . MVP (mitral valve prolapse)   . RLS (restless legs syndrome)   . Tubular adenoma of colon 08/2019    Past Surgical History:  Procedure Laterality Date  . ABDOMINAL HYSTERECTOMY  06/17/2011   Procedure: HYSTERECTOMY ABDOMINAL;  Surgeon: Arloa Koh;  Location: [facility];  Service: Gynecology;  Laterality: N/A;  Converted to Abdominal Hysterectomy with lysis of adhesions   . ANTERIOR AND POSTERIOR REPAIR  02/25/2012   Procedure: ANTERIOR (CYSTOCELE) AND POSTERIOR REPAIR (RECTOCELE);  Surgeon: Daria Pastures, MD;  Location: [facility];  Service: Gynecology;  Laterality: N/A;  ANterior cystocele repair ONLY  . BIOPSY  08/29/2019   Procedure: BIOPSY;  Surgeon: Lavena Bullion, DO;  Location: [place];  Service: Gastroenterology;;  . BLADDER SUSPENSION  02/25/2012   Procedure: TRANSVAGINAL TAPE (TVT) PROCEDURE;  Surgeon: Daria Pastures, MD;  Location: [facility];  Service: Gynecology;  Laterality: N/A;  . COLONOSCOPY WITH PROPOFOL N/A 08/29/2019   Procedure: COLONOSCOPY WITH PROPOFOL;  Surgeon: Lavena Bullion,  DO;  Location: [facility];  Service: Gastroenterology;  Laterality: N/A;  . CYSTOSCOPY  02/25/2012   Procedure: CYSTOSCOPY;  Surgeon: Daria Pastures, MD;  Location: [facility];  Service: Gynecology;  Laterality: N/A;  . KNEE ARTHROSCOPY  2010  . LAPAROSCOPIC ASSISTED VAGINAL HYSTERECTOMY  06/17/2011   Procedure: LAPAROSCOPIC ASSISTED  VAGINAL HYSTERECTOMY;  Surgeon: Arloa Koh;  Location: [facility];  Service: Gynecology;  Laterality: N/A;  Attempted   . POLYPECTOMY  08/29/2019   Procedure: POLYPECTOMY;  Surgeon: Lavena Bullion, DO;  Location[facility];  Service: Gastroenterology;;  . SALPINGOOPHORECTOMY  06/17/2011   Procedure: SALPINGO OOPHERECTOMY;  Surgeon: Arloa Koh;  Location: [facility];  Service: Gynecology;  Laterality: Bilateral;  . TONSILLECTOMY      Social History:  reports that she has been smoking cigarettes. She has been smoking about 0.50 packs per day. She has never used smokeless tobacco. She reports current alcohol use. She reports current drug use. Drug: Cocaine.  Allergies  Allergen Reactions  . Iodine     REACTION: in eye gtts Iodine eye drops; pt has no reactions to CT contrast    Family History  Problem Relation Age of Onset  . Diabetes Mother   . Dementia Mother   . Clotting disorder Father   . Macular degeneration Father   . Crohn's disease Sister   . Colon cancer Neg Hx   . Esophageal cancer Neg Hx   . Pancreatic cancer Neg Hx   . Stomach cancer Neg Hx   . Liver disease Neg Hx      Prior to Admission medications   Medication Sig Start Date End Date Taking? Authorizing Provider  azithromycin (ZITHROMAX) 250 MG tablet Take 1 tablet (250 mg total) by mouth in the morning and at bedtime. 11/27/20   Tysinger, Camelia Eng, PA-C  dicyclomine (BENTYL) 10 MG capsule Take 1 capsule (10 mg total) by mouth 3 (three) times daily before meals. 05/17/20   [name], MD  esomeprazole (NEXIUM) 40 MG capsule TAKE ONE (1) CAPSULE BY MOUTH 2 TIMES DAILY 07/03/20   Antonieta Pert, MD  losartan-hydrochlorothiazide (HYZAAR) 100-12.5 MG tablet Take 1 tablet by mouth daily. 08/03/20   Denita Lung, MD  pramipexole (MIRAPEX) 1 MG tablet Take 1 tablet by mouth daily 03/26/20   Denita Lung, MD  predniSONE (DELTASONE) 10 MG tablet Take 20 mg by mouth daily.    [provider]  promethazine  (PHENERGAN) 25 MG tablet Take 1 tablet (25 mg total) by mouth every 6 (six) hours as needed for nausea or vomiting. 12/20/19   Esterwood, Amy S, PA-C  venlafaxine XR (EFFEXOR-XR) 150 MG 24 hr capsule TAKE 2 CAPSULES BY MOUTH EVERY DAY 07/26/20   Denita Lung, MD    Physical Exam: Vitals:   12/04/20 1930 12/04/20 2004 12/04/20 2030 12/04/20 2100  BP: (!) 126/97  102/83 105/86  Pulse: (!) 111  (!) 110 (!) 106  Resp: 13  14 13   Temp:      TempSrc:      SpO2: 99%  96% 96%  Weight:  63.8 kg    Height:  5' 3"  (1.6 m)     Constitutional: Resting supine in bed, NAD, calm, comfortable Eyes: PERRL, lids and conjunctivae normal ENMT: Mucous membranes are moist. Posterior pharynx clear of any exudate or lesions.Normal dentition.  Neck: normal, supple, no masses. Respiratory: clear to auscultation bilaterally, no wheezing, no crackles. Normal respiratory effort. No accessory muscle use.  Cardiovascular: Tachycardic,  no murmurs / rubs / gallops. No extremity edema. 2+ pedal pulses. Abdomen: Mild periumbilical tenderness, no masses palpated. No hepatosplenomegaly. Bowel sounds hyperactive.  Musculoskeletal: no clubbing / cyanosis. No joint deformity upper and lower extremities. Good ROM, no contractures. Normal muscle tone.  Skin: no rashes, lesions, ulcers. No induration Neurologic: CN 2-12 grossly intact. Sensation intact. Strength 5/5 in all 4.  Psychiatric: Normal judgment and insight. Alert and oriented x 3. Normal mood.   Labs on Admission: I have personally reviewed following labs and imaging studies  CBC: Recent Labs  Lab 12/04/20 1725  WBC 17.6*  HGB 16.8*  HCT 51.5*  MCV 88.5  PLT 992*   Basic Metabolic Panel: Recent Labs  Lab 12/04/20 1725  NA 134*  K 3.4*  CL 95*  CO2 24  GLUCOSE 144*  BUN 23  CREATININE 1.01*  CALCIUM 8.7*   GFR: Estimated Creatinine Clearance: 52.6 mL/min (A) (by C-G formula based on SCr of 1.01 mg/dL (H)). Liver Function Tests: Recent Labs   Lab 12/04/20 1725  AST 30  ALT 24  ALKPHOS 90  BILITOT 0.9  PROT 8.1  ALBUMIN 3.4*   Recent Labs  Lab 12/04/20 1725  LIPASE 20   No results for input(s): AMMONIA in the last 168 hours. Coagulation Profile: No results for input(s): INR, PROTIME in the last 168 hours. Cardiac Enzymes: No results for input(s): CKTOTAL, CKMB, CKMBINDEX, TROPONINI in the last 168 hours. BNP (last 3 results) No results for input(s): PROBNP in the last 8760 hours. HbA1C: No results for input(s): HGBA1C in the last 72 hours. CBG: No results for input(s): GLUCAP in the last 168 hours. Lipid Profile: No results for input(s): CHOL, HDL, LDLCALC, TRIG, CHOLHDL, LDLDIRECT in the last 72 hours. Thyroid Function Tests: No results for input(s): TSH, T4TOTAL, FREET4, T3FREE, THYROIDAB in the last 72 hours. Anemia Panel: No results for input(s): VITAMINB12, FOLATE, FERRITIN, TIBC, IRON, RETICCTPCT in the last 72 hours. Urine analysis:    Component Value Date/Time   COLORURINE AMBER (A) 12/04/2020 1727   APPEARANCEUR CLOUDY (A) 12/04/2020 1727   LABSPEC 1.031 (H) 12/04/2020 1727   LABSPEC 1.025 05/13/2018 1018   PHURINE 5.0 12/04/2020 1727   GLUCOSEU NEGATIVE 12/04/2020 1727   HGBUR NEGATIVE 12/04/2020 1727   BILIRUBINUR NEGATIVE 12/04/2020 1727   BILIRUBINUR negative 05/13/2018 1018   BILIRUBINUR n 12/04/2011 1014   KETONESUR 5 (A) 12/04/2020 1727   PROTEINUR 30 (A) 12/04/2020 1727   UROBILINOGEN negative 12/04/2011 1014   NITRITE POSITIVE (A) 12/04/2020 1727   LEUKOCYTESUR LARGE (A) 12/04/2020 1727    Radiological Exams on Admission: CT ABDOMEN PELVIS W CONTRAST  Result Date: 12/04/2020 CLINICAL DATA:  60 year old female with abdominal pain. EXAM: CT ABDOMEN AND PELVIS WITH CONTRAST TECHNIQUE: Multidetector CT imaging of the abdomen and pelvis was performed using the standard protocol following bolus administration of intravenous contrast. CONTRAST:  124m OMNIPAQUE IOHEXOL 300 MG/ML  SOLN  COMPARISON:  CT abdomen pelvis dated 06/27/2020. FINDINGS: Lower chest: The visualized lung bases are clear. No intra-abdominal free air.  Small free fluid in the pelvis. Hepatobiliary: The liver is unremarkable. No intrahepatic biliary dilatation. The gallbladder is mildly distended. No calcified gallstone or pericholecystic fluid. Pancreas: Unremarkable. No pancreatic ductal dilatation or surrounding inflammatory changes. Spleen: Normal in size without focal abnormality. Adrenals/Urinary Tract: The adrenal glands unremarkable. There is no hydronephrosis on either side. There is symmetric enhancement and excretion of contrast by both kidneys. The visualized ureters and urinary bladder appear unremarkable. Stomach/Bowel: There is  diffuse dilatation of small bowel measuring up to 5.5 cm in caliber. There is a transition in the distal ileum within the pelvis were there is mild thickening of the bowel loops and severe narrowing of the bowel lumen consistent with stricture. There is a transition within the pelvis (85/5 and 67/2). There is tethering of adjacent small bowel loops in this region consistent with adhesions. An enteroenteric fistula is not excluded. The colon is unremarkable. The appendix is normal. Vascular/Lymphatic: The abdominal aorta and IVC unremarkable. No portal venous gas. No adenopathy. Reproductive: Hysterectomy.  No adnexal masses. Other: None Musculoskeletal: Degenerative changes of the spine. No acute osseous pathology. IMPRESSION: High-grade distal small-bowel obstruction with transition within the pelvis secondary to structures an adhesion. Electronically Signed   By: Anner Crete M.D.   On: 12/04/2020 20:15    EKG: Not performed.  Assessment/Plan Principal Problem:   Small bowel obstruction (HCC) Active Problems:   Hypertension   Crohn's disease with complication ([place])   Hypokalemia  Cynthia [name] is a 60 y.o. female with medical history significant for fistulizing  Crohn's ileocolitis, hypertension, and depression/anxiety who is admitted with small bowel obstruction.  High-grade distal small bowel obstruction in setting of fistulizing Crohn's ileocolitis: Seen on CT abdomen/pelvis 12/04/2020.  Has continued nausea and vomiting.  -General surgery to see in a.m. -Place NG tube -Keep n.p.o. -Continue IV analgesics and antiemetics as needed  Leukocytosis: Likely reactive in setting of SBO as well as hemoconcentrated.  Continue to monitor.  Hypertension: Currently stable.  Home antihypertensives on hold while NPO.  Hypokalemia: Supplement with IV fluids.  Asymptomatic bacteriuria: Received 1 dose IV ceftriaxone in the ED.  Hold further antibiotics at this time.   DVT prophylaxis: Lovenox Code Status: Full code, confirmed with patient Family Communication: Discussed with patient, she has discussed with family Disposition Plan: From home likely discharge to home pending resolution of small bowel obstruction Consults called: General surgery Level of care: Med-Surg Admission status:  Status is: Inpatient  Remains inpatient appropriate because:Ongoing active pain requiring inpatient pain management and IV treatments appropriate due to intensity of illness or inability to take PO   Dispo: The patient is from: Home              Anticipated d/c is to: Home              Anticipated d/c date is: 3 days              Patient currently is not medically stable to d/c.   Difficult to place patient No  Zada Finders MD [facility]  If 7PM-7AM, please contact night-coverage [email or web address]  12/04/2020, 9:56 PM

## 2020-12-04 NOTE — ED Notes (Signed)
Coming from GI-history of SBO-sending patient to ED for symptoms/work up

## 2020-12-04 NOTE — Progress Notes (Signed)
Chief Complaint: Abdominal pain  HPI:    [name] is a 60 year old female with past medical history of Crohn's ileitis, reflux, IBS and others listed below, known to Dr. Fuller Plan, who presents to clinic today as an urgent add-on appointment for abdominal pain.      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  Gallbladder distention without pericholecystic stranding or wall thickening.    08/29/2019 colonoscopy with significant looping of the colon, 2 3-4 mm polyps in the rectum and rectosigmoid colon, vascular pattern decreased mucosa in the descending colon, transverse colon and ascending colon as well as the cecum, ileitis.  She was started on prednisone.    09/04/20 patient seen in clinic by Dr. Fuller Plan.  At that time discussed that she had been hospitalized in August for a partial small bowel obstruction, stricture in the distal ileum, enteroenteric fistula and enterocolonic fistula.  She was treated with Humira at that time and symptoms improved on Solu-Medrol and oral prednisone and she was discharged home.  She received her first Stelara infusion 10/25 and described a slight rash on her trunk which was itchy but went away with Benadryl.  She had felt well for a few weeks.  At that time she was continued on Stelara.  She was tapered off her prednisone.    12/03/2020 patient called and described frequent stomach cramps.  She was scheduled with me.    Today, the patient presents to clinic accompanied by family member and tells me that for at least the past for 5 days she has had severe 10/10 abdominal pain.  Her last bowel movement was 3 days ago.  She has continued to pass very small amounts of gas, the last time was 2 hours ago.  Over  the past 24 to 48 hours has had severe nausea and episodes of vomiting when she tries to eat or drink anything.  Amazingly was able to hold in most of a peanut butter and jelly sandwich and a bite of barbecue last night.  Tells me that initially when the symptoms started it did not feel like her last bowel obstruction but "now it does".  She is tearful in the room and needs to lay down to avoid agonizing pain.  Last dose of Stelara was around January 3.  Tells me that her next dose is due February 14.  Currently, she is taking 20 mg of Prednisone daily (I think this is still from the taper in November timeframe).    Denies fever or chills.  Past Medical History:  Diagnosis Date  . Anemia 2012  . Anxiety   . Bronchitis   . Crohn's ileitis ([place])   . Depression   . GERD (gastroesophageal reflux disease)   . Headache(784.0)    MIGRAINE  . Heart murmur   . Hiatal hernia   . Hypertension   . IBS (irritable bowel syndrome)   . Incontinence   . MVP (mitral valve prolapse)   . RLS (restless legs syndrome)   . Tubular adenoma of colon 08/2019    Past Surgical History:  Procedure Laterality Date  . ABDOMINAL HYSTERECTOMY  06/17/2011   Procedure: HYSTERECTOMY ABDOMINAL;  Surgeon: Arloa Koh;  Location: [facility];  Service: Gynecology;  Laterality: N/A;  Converted to Abdominal Hysterectomy with lysis of  adhesions   . ANTERIOR AND POSTERIOR REPAIR  02/25/2012   Procedure: ANTERIOR (CYSTOCELE) AND POSTERIOR REPAIR (RECTOCELE);  Surgeon: Daria Pastures, MD;  Location: [facility];  Service: Gynecology;  Laterality: N/A;  ANterior cystocele repair ONLY  . BIOPSY  08/29/2019   Procedure: BIOPSY;  Surgeon: Lavena Bullion, DO;  Location: [facility];  Service: Gastroenterology;;  . BLADDER SUSPENSION  02/25/2012   Procedure: TRANSVAGINAL TAPE (TVT) PROCEDURE;  Surgeon: Daria Pastures, MD;  Location: [facility];  Service: Gynecology;  Laterality: N/A;  . COLONOSCOPY WITH PROPOFOL N/A 08/29/2019    Procedure: COLONOSCOPY WITH PROPOFOL;  Surgeon: Lavena Bullion, DO;  Location: [facility];  Service: Gastroenterology;  Laterality: N/A;  . CYSTOSCOPY  02/25/2012   Procedure: CYSTOSCOPY;  Surgeon: Daria Pastures, MD;  Location: [facility];  Service: Gynecology;  Laterality: N/A;  . KNEE ARTHROSCOPY  2010  . LAPAROSCOPIC ASSISTED VAGINAL HYSTERECTOMY  06/17/2011   Procedure: LAPAROSCOPIC ASSISTED VAGINAL HYSTERECTOMY;  Surgeon: Arloa Koh;  Location: [facility];  Service: Gynecology;  Laterality: N/A;  Attempted   . POLYPECTOMY  08/29/2019   Procedure: POLYPECTOMY;  Surgeon: Lavena Bullion, DO;  Location[facility];  Service: Gastroenterology;;  . SALPINGOOPHORECTOMY  06/17/2011   Procedure: SALPINGO OOPHERECTOMY;  Surgeon: Arloa Koh;  Location: [facility];  Service: Gynecology;  Laterality: Bilateral;  . TONSILLECTOMY      Current Outpatient Medications  Medication Sig Dispense Refill  . azithromycin (ZITHROMAX) 250 MG tablet Take 1 tablet (250 mg total) by mouth in the morning and at bedtime. 10 tablet 0  . dicyclomine (BENTYL) 10 MG capsule Take 1 capsule (10 mg total) by mouth 3 (three) times daily before meals. (Patient not taking: No sig reported) 90 capsule 3  . esomeprazole (NEXIUM) 40 MG capsule TAKE ONE (1) CAPSULE BY MOUTH 2 TIMES DAILY 180 capsule 3  . hyoscyamine (LEVSIN) 0.125 MG tablet 1 TABLET EVERY 4 TO 6 HOURS AS NEEDED FOR SIGNIFICANT CRAMPING 40 tablet 1  . losartan-hydrochlorothiazide (HYZAAR) 100-12.5 MG tablet Take 1 tablet by mouth daily. 90 tablet 3  . Multiple Vitamins-Minerals (EMERGEN-C IMMUNE PLUS) PACK Take 1 tablet by mouth 2 (two) times daily. 10 each 0  . ondansetron (ZOFRAN ODT) 4 MG disintegrating tablet Take 1 tablet (4 mg total) by mouth every 6 (six) hours as needed for nausea or vomiting. (Patient not taking: Reported on 11/27/2020) 30 tablet 1  . potassium chloride SA (KLOR-CON) 20 MEQ tablet Take 1 tablet (20 mEq total) by mouth daily for 14  days. (Patient not taking: Reported on 11/27/2020) 14 tablet 0  . pramipexole (MIRAPEX) 1 MG tablet Take 1 tablet by mouth daily 90 tablet 3  . predniSONE (DELTASONE) 10 MG tablet 4 tablets    . promethazine (PHENERGAN) 25 MG tablet Take 1 tablet (25 mg total) by mouth every 6 (six) hours as needed for nausea or vomiting. 40 tablet 1  . venlafaxine XR (EFFEXOR-XR) 150 MG 24 hr capsule TAKE 2 CAPSULES BY MOUTH EVERY DAY 180 capsule 0   No current facility-administered medications for this visit.    Allergies as of 12/04/2020 - Review Complete 11/27/2020  Allergen Reaction Noted  . Iodine  03/17/2008    Family History  Problem Relation Age of Onset  . Diabetes Mother   . Dementia Mother   . Clotting disorder Father   . Macular degeneration Father   . Crohn's disease Sister   . Colon cancer Neg Hx   . Esophageal cancer  Neg Hx   . Pancreatic cancer Neg Hx   . Stomach cancer Neg Hx   . Liver disease Neg Hx     Social History   Socioeconomic History  . Marital status: Widowed    Spouse name: Not on file  . Number of children: 1  . Years of education: Not on file  . Highest education level: Not on file  Occupational History  . Occupation: Designer, television/film set: [employer]  Tobacco Use  . Smoking status: Current Every Day Smoker    Packs/day: 0.50    Types: Cigarettes    Last attempt to quit: 12/07/2011    Years since quitting: 9.0  . Smokeless tobacco: Never Used  Vaping Use  . Vaping Use: Never used  Substance and Sexual Activity  . Alcohol use: Yes    Comment: socially; seldom  . Drug use: Yes    Types: Cocaine    Comment: socially; d/c 3 weeks ago per patient 05/31/20  . Sexual activity: Not Currently  Other Topics Concern  . Not on file  Social History Narrative   Lives with boyfriend who she states is an alcoholic   Social Determinants of Radio broadcast assistant Strain: Not on file  Food Insecurity: Not on file  Transportation Needs: Not on file   Physical Activity: Not on file  Stress: Not on file  Social Connections: Not on file  Intimate Partner Violence: Not on file    Review of Systems:    Constitutional: No weight loss, fever or chills Cardiovascular: No chest pain   Respiratory: No SOB Gastrointestinal: See HPI and otherwise negative   Physical Exam:  Vital signs: BP 100/82   Pulse (!) 120   Ht 5\' 3"  (1.6 m)   Wt 140 lb 9.6 oz (63.8 kg)   LMP 06/10/2011 (Exact Date)   BMI 24.91 kg/m   Constitutional:   Pleasant Caucasian female appears to be in marked distress, tearful, Well developed, Well nourished, alert and cooperative Head:  Normocephalic and atraumatic. Eyes:   PEERL, EOMI. No icterus. Conjunctiva pink. Ears:  Normal auditory acuity. Neck:  Supple Throat: Oral cavity and pharynx without inflammation, swelling or lesion.  Respiratory: Respirations even and unlabored. Lungs clear to auscultation bilaterally.   No wheezes, crackles, or rhonchi.  Cardiovascular: Normal S1, S2. No MRG. Regular rate and rhythm. No peripheral edema, cyanosis or pallor.  Gastrointestinal:  Tense, moderate distension, marked ttp in center of abdomen, Tinkling BS in LUQ, decreased in other quadrants, tympanic. No appreciable masses or hepatomegaly. Rectal:  Not performed.  Msk:  Symmetrical without gross deformities. Without edema, no deformity or joint abnormality.  Neurologic:  Alert and  oriented x4;  grossly normal neurologically.  Skin:   Dry and intact without significant lesions or rashes. Psychiatric: Demonstrates good judgement and reason without abnormal affect or behaviors.  RELEVANT LABS AND IMAGING: CBC    Component Value Date/Time   WBC 10.3 07/03/2020 0444   RBC 4.22 07/03/2020 0444   HGB 12.0 07/03/2020 0444   HCT 37.5 07/03/2020 0444   PLT 235 07/03/2020 0444   MCV 88.9 07/03/2020 0444   MCH 28.4 07/03/2020 0444   MCHC 32.0 07/03/2020 0444   RDW 13.5 07/03/2020 0444   LYMPHSABS 1.1 07/01/2020 1148    MONOABS 0.4 07/01/2020 1148   EOSABS 0.0 07/01/2020 1148   BASOSABS 0.0 07/01/2020 1148    CMP     Component Value Date/Time   NA 142 08/03/2020 1315  K 3.8 08/03/2020 1315   CL 103 08/03/2020 1315   CO2 26 08/03/2020 1315   GLUCOSE 104 (H) 08/03/2020 1315   GLUCOSE 84 07/03/2020 0444   BUN 17 08/03/2020 1315   CREATININE 0.86 08/03/2020 1315   CREATININE 0.94 05/08/2016 0741   CALCIUM 9.2 08/03/2020 1315   PROT 6.6 08/03/2020 1315   ALBUMIN 3.7 (L) 08/03/2020 1315   AST 9 08/03/2020 1315   ALT 8 08/03/2020 1315   ALKPHOS 76 08/03/2020 1315   BILITOT <0.2 08/03/2020 1315   GFRNONAA 74 08/03/2020 1315   GFRAA 85 08/03/2020 1315    Assessment: 1.  Crohn's ileitis: Currently on Stelara, next infusion 12/17/2020, started with abdominal pain over the past 5 days which has been increasing in severity, now with no bowel movement in 3 days and limited amounts of gas passage 2.  Abdominal pain and decreased bowel movements: High suspicion for bowel obstruction, history of the same in August of last year, Crohn's being managed as above  Plan: 1.  Told patient to proceed to the ER across the street.  Did call the ER and gave report on the patient to let them know she was coming. 2.  Discussed patient with Alonza Bogus, PA-C who is covering the hospital for [place] this week.  She is aware that patient is headed to the ER but please call her when the patient is admitted so that she can be formally consulted. 3.  Patient to follow in clinic per recommendations after being seen in the hospital.  Ellouise Newer, PA-C [facility] 12/04/2020, 3:45 PM  Cc: Denita Lung, MD

## 2020-12-04 NOTE — ED Provider Notes (Signed)
[facility] DEPT Provider Note   CSN: [id number] Arrival date & time: 12/04/20  1615     History Chief Complaint  Patient presents with  . Emesis    Cynthia [name] is a 60 y.o. female past medical history of Crohn's, mitral valve prolapse, tubular adenoma of colon who presents for evaluation of abdominal pain, nausea/vomiting, constipation that has been ongoing for last 3 to 4 days.  She reports she started having some nausea and vomiting as well as some diffuse abdominal cramping.  She reports initially, she had some diarrhea and then that stopped.  She states she has not a bowel movement in 3 days.  She states she had not passed gas until this morning but since then, she has not passed any more.  She she has not been able to tolerate any p.o.  She takes Stelara for her Crohn's and follows with Dr. Fuller Plan ([facility]). She went to their office today and they were concerned about a possible recurrent small bowel obstruction she was sent to the ED for further evaluation.  Patient states she has a history of small bowel obstruction that they attribute to her Crohn's disease.  She is never had any abdominal surgeries.  She denies any fevers, chest pain, difficulty breathing, difficulty urinating.  The history is provided by the patient.       Past Medical History:  Diagnosis Date  . Anemia 2012  . Anxiety   . Bronchitis   . Crohn's ileitis ([place])   . Depression   . GERD (gastroesophageal reflux disease)   . Headache(784.0)    MIGRAINE  . Heart murmur   . Hiatal hernia   . Hypertension   . IBS (irritable bowel syndrome)   . Incontinence   . MVP (mitral valve prolapse)   . RLS (restless legs syndrome)   . Tubular adenoma of colon 08/2019    Patient Active Problem List   Diagnosis Date Noted  . Small bowel obstruction ([place]) 12/04/2020  . Hypokalemia 12/04/2020  . Cough 11/27/2020  . Otalgia of both ears 11/27/2020  . Body aches 11/27/2020  .  Crohn's disease with complication ([place]) [id number]  . Fever 11/27/2020  . High risk medication use 11/27/2020  . Crohn disease ([place]) 06/27/2020  . Crohn's ileitis, with intestinal obstruction ([place]) 10/05/2019  . Colitis   . Rectal polyp   . SBO (small bowel obstruction) ([place]) 08/26/2019  . 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  . RLS (restless legs syndrome) 09/25/2014  . Current smoker 09/19/2011  . Migraine headache 09/19/2011  . Hypertension 09/19/2011  . Obesity (BMI 30-39.9) 09/19/2011  . GERD 03/17/2008  . MVP (mitral valve prolapse) 03/17/2008  . HIATAL HERNIA 05/03/2002    Past Surgical History:  Procedure Laterality Date  . ABDOMINAL HYSTERECTOMY  06/17/2011   Procedure: HYSTERECTOMY ABDOMINAL;  Surgeon: Arloa Koh;  Location: [facility];  Service: Gynecology;  Laterality: N/A;  Converted to Abdominal Hysterectomy with lysis of adhesions   . ANTERIOR AND POSTERIOR REPAIR  02/25/2012   Procedure: ANTERIOR (CYSTOCELE) AND POSTERIOR REPAIR (RECTOCELE);  Surgeon: Daria Pastures, MD;  Location: [facility];  Service: Gynecology;  Laterality: N/A;  ANterior cystocele repair ONLY  . BIOPSY  08/29/2019   Procedure: BIOPSY;  Surgeon: Lavena Bullion, DO;  Location: [facility];  Service: Gastroenterology;;  . BLADDER SUSPENSION  02/25/2012   Procedure: TRANSVAGINAL TAPE (TVT) PROCEDURE;  Surgeon: Daria Pastures, MD;  Location: [facility];  Service: Gynecology;  Laterality: N/A;  .  COLONOSCOPY WITH PROPOFOL N/A 08/29/2019   Procedure: COLONOSCOPY WITH PROPOFOL;  Surgeon: Lavena Bullion, DO;  Location: [facility];  Service: Gastroenterology;  Laterality: N/A;  . CYSTOSCOPY  02/25/2012   Procedure: CYSTOSCOPY;  Surgeon: Daria Pastures, MD;  Location: [facility];  Service: Gynecology;  Laterality: N/A;  . KNEE ARTHROSCOPY  2010  . LAPAROSCOPIC ASSISTED VAGINAL HYSTERECTOMY  06/17/2011   Procedure: LAPAROSCOPIC ASSISTED VAGINAL HYSTERECTOMY;  Surgeon: Arloa Koh;  Location: [facility];   Service: Gynecology;  Laterality: N/A;  Attempted   . POLYPECTOMY  08/29/2019   Procedure: POLYPECTOMY;  Surgeon: Lavena Bullion, DO;  Location: [facility];  Service: Gastroenterology;;  . SALPINGOOPHORECTOMY  06/17/2011   Procedure: SALPINGO OOPHERECTOMY;  Surgeon: Arloa Koh;  Location: [facility];  Service: Gynecology;  Laterality: Bilateral;  . TONSILLECTOMY       OB History   No obstetric history on file.     Family History  Problem Relation Age of Onset  . Diabetes Mother   . Dementia Mother   . Clotting disorder Father   . Macular degeneration Father   . Crohn's disease Sister   . Colon cancer Neg Hx   . Esophageal cancer Neg Hx   . Pancreatic cancer Neg Hx   . Stomach cancer Neg Hx   . Liver disease Neg Hx     Social History   Tobacco Use  . Smoking status: Current Every Day Smoker    Packs/day: 0.50    Types: Cigarettes    Last attempt to quit: 12/07/2011    Years since quitting: 9.0  . Smokeless tobacco: Never Used  . Tobacco comment: none in 2 weeks as of 12/04/20  Vaping Use  . Vaping Use: Never used  Substance Use Topics  . Alcohol use: Yes    Comment: socially; seldom  . Drug use: Yes    Types: Cocaine    Comment: socially; d/c 3 weeks ago per patient 05/31/20    Home Medications Prior to Admission medications   Medication Sig Start Date End Date Taking? Authorizing Provider  azithromycin (ZITHROMAX) 250 MG tablet Take 1 tablet (250 mg total) by mouth in the morning and at bedtime. 11/27/20   Tysinger, Camelia Eng, PA-C  dicyclomine (BENTYL) 10 MG capsule Take 1 capsule (10 mg total) by mouth 3 (three) times daily before meals. 05/17/20   [name], MD  esomeprazole (NEXIUM) 40 MG capsule TAKE ONE (1) CAPSULE BY MOUTH 2 TIMES DAILY 07/03/20   Antonieta Pert, MD  losartan-hydrochlorothiazide (HYZAAR) 100-12.5 MG tablet Take 1 tablet by mouth daily. 08/03/20   Denita Lung, MD  pramipexole (MIRAPEX) 1 MG tablet Take 1 tablet by mouth daily 03/26/20    Denita Lung, MD  predniSONE (DELTASONE) 10 MG tablet Take 20 mg by mouth daily.    [provider]  promethazine (PHENERGAN) 25 MG tablet Take 1 tablet (25 mg total) by mouth every 6 (six) hours as needed for nausea or vomiting. 12/20/19   Esterwood, Amy S, PA-C  venlafaxine XR (EFFEXOR-XR) 150 MG 24 hr capsule TAKE 2 CAPSULES BY MOUTH EVERY DAY 07/26/20   Denita Lung, MD    Allergies    Iodine  Review of Systems   Review of Systems  Constitutional: Negative for fever.  Respiratory: Negative for cough and shortness of breath.   Cardiovascular: Negative for chest pain.  Gastrointestinal: Positive for abdominal pain, constipation, nausea and vomiting. Negative for diarrhea.  Genitourinary: Negative for dysuria and  hematuria.  Neurological: Negative for headaches.  All other systems reviewed and are negative.   Physical Exam Updated Vital Signs BP 105/86   Pulse (!) 106   Temp 98.1 F (36.7 C) (Oral)   Resp 13   Ht 5\' 3"  (1.6 m)   Wt 63.8 kg   LMP 06/10/2011 (Exact Date)   SpO2 96%   BMI 24.92 kg/m   Physical Exam Vitals and nursing note reviewed.  Constitutional:      Appearance: Normal appearance. She is well-developed and well-nourished.  HENT:     Head: Normocephalic and atraumatic.     Mouth/Throat:     Mouth: Oropharynx is clear and moist and mucous membranes are normal.  Eyes:     General: Lids are normal.     Extraocular Movements: EOM normal.     Conjunctiva/sclera: Conjunctivae normal.     Pupils: Pupils are equal, round, and reactive to light.  Cardiovascular:     Rate and Rhythm: Normal rate and regular rhythm.     Pulses: Normal pulses.     Heart sounds: Normal heart sounds. No murmur heard. No friction rub. No gallop.   Pulmonary:     Effort: Pulmonary effort is normal.     Breath sounds: Normal breath sounds.     Comments: Lungs clear to auscultation bilaterally.  Symmetric chest rise.  No wheezing, rales, rhonchi. Abdominal:      General: Bowel sounds are decreased.     Palpations: Abdomen is soft. Abdomen is not rigid.     Tenderness: There is generalized abdominal tenderness. There is no guarding.     Comments: Decreased bowel sounds noted. Generalized tenderness noted diffusely. No rigidity, no guarding.   Musculoskeletal:        General: Normal range of motion.     Cervical back: Full passive range of motion without pain.  Skin:    General: Skin is warm and dry.     Capillary Refill: Capillary refill takes less than 2 seconds.  Neurological:     Mental Status: She is alert and oriented to person, place, and time.  Psychiatric:        Mood and Affect: Mood and affect normal.        Speech: Speech normal.     ED Results / Procedures / Treatments   Labs (all labs ordered are listed, but only abnormal results are displayed) Labs Reviewed  COMPREHENSIVE METABOLIC PANEL - Abnormal; Notable for the following components:      Result Value   Sodium 134 (*)    Potassium 3.4 (*)    Chloride 95 (*)    Glucose, Bld 144 (*)    Creatinine, Ser 1.01 (*)    Calcium 8.7 (*)    Albumin 3.4 (*)    All other components within normal limits  CBC - Abnormal; Notable for the following components:   WBC 17.6 (*)    RBC 5.82 (*)    Hemoglobin 16.8 (*)    HCT 51.5 (*)    Platelets 624 (*)    All other components within normal limits  URINALYSIS, ROUTINE W REFLEX MICROSCOPIC - Abnormal; Notable for the following components:   Color, Urine AMBER (*)    APPearance CLOUDY (*)    Specific Gravity, Urine 1.031 (*)    Ketones, ur 5 (*)    Protein, ur 30 (*)    Nitrite POSITIVE (*)    Leukocytes,Ua LARGE (*)    Bacteria, UA MANY (*)    All  other components within normal limits  URINE CULTURE  SARS CORONAVIRUS 2 (TAT 6-24 HRS)  LIPASE, BLOOD    EKG None  Radiology CT ABDOMEN PELVIS W CONTRAST  Result Date: 12/04/2020 CLINICAL DATA:  60 year old female with abdominal pain. EXAM: CT ABDOMEN AND PELVIS WITH CONTRAST  TECHNIQUE: Multidetector CT imaging of the abdomen and pelvis was performed using the standard protocol following bolus administration of intravenous contrast. CONTRAST:  136mL OMNIPAQUE IOHEXOL 300 MG/ML  SOLN COMPARISON:  CT abdomen pelvis dated 06/27/2020. FINDINGS: Lower chest: The visualized lung bases are clear. No intra-abdominal free air.  Small free fluid in the pelvis. Hepatobiliary: The liver is unremarkable. No intrahepatic biliary dilatation. The gallbladder is mildly distended. No calcified gallstone or pericholecystic fluid. Pancreas: Unremarkable. No pancreatic ductal dilatation or surrounding inflammatory changes. Spleen: Normal in size without focal abnormality. Adrenals/Urinary Tract: The adrenal glands unremarkable. There is no hydronephrosis on either side. There is symmetric enhancement and excretion of contrast by both kidneys. The visualized ureters and urinary bladder appear unremarkable. Stomach/Bowel: There is diffuse dilatation of small bowel measuring up to 5.5 cm in caliber. There is a transition in the distal ileum within the pelvis were there is mild thickening of the bowel loops and severe narrowing of the bowel lumen consistent with stricture. There is a transition within the pelvis (85/5 and 67/2). There is tethering of adjacent small bowel loops in this region consistent with adhesions. An enteroenteric fistula is not excluded. The colon is unremarkable. The appendix is normal. Vascular/Lymphatic: The abdominal aorta and IVC unremarkable. No portal venous gas. No adenopathy. Reproductive: Hysterectomy.  No adnexal masses. Other: None Musculoskeletal: Degenerative changes of the spine. No acute osseous pathology. IMPRESSION: High-grade distal small-bowel obstruction with transition within the pelvis secondary to structures an adhesion. Electronically Signed   By: Anner Crete M.D.   On: 12/04/2020 20:15    Procedures Procedures   Medications Ordered in ED Medications   sodium chloride 0.9 % bolus 1,000 mL (0 mLs Intravenous Stopped 12/04/20 2103)  ondansetron (ZOFRAN) injection 4 mg (4 mg Intravenous Given 12/04/20 1932)  morphine 4 MG/ML injection 4 mg (4 mg Intravenous Given 12/04/20 1933)  iohexol (OMNIPAQUE) 300 MG/ML solution 100 mL (100 mLs Intravenous Contrast Given 12/04/20 1959)  cefTRIAXone (ROCEPHIN) 1 g in sodium chloride 0.9 % 100 mL IVPB (1 g Intravenous New Bag/Given (Non-Interop) 12/04/20 2106)    ED Course  I have reviewed the triage vital signs and the nursing notes.  Pertinent labs & imaging results that were available during my care of the patient were reviewed by me and considered in my medical decision making (see chart for details).    MDM Rules/Calculators/A&P                          60 y.o. F with PMH/o Crohn's who presents for evaluation of generalized abdominal pain, nausea/vomiting as well as constipation.  She saw her GI doctor's today and was evaluated and sent to the emergency department.  On initial arrival, she is tachycardic.  Vitals otherwise stable.  She has some decreased bowel sounds and some diffuse tenderness noted to her abdomen.  Concern for small bowel obstruction versus viral process.  Labs ordered at triage.  Will obtain imaging.  CMP shows sodium 134, potassium 3.4.  BUN of 23, creatinine 1.01.  Lipase is unremarkable.  UA shows positive nitrites, leukocytes, pyuria.  CBC shows leukocytosis of 17.6.  CT scan shows a high-grade distal  small bowel obstruction with transition within the pelvis secondary structures at infusion.  Discussed patient with Dr. Brantley Stage (Gen Surg). Surgery team will plan to see the patient in consultation tomorrow. He would like an NG tube placed.   Discussed patient with Dr. Posey Pronto (hospitalist) who accepts patient for admission.   Portions of this note were generated with [employer]. Dictation errors may occur despite best attempts at proofreading.   Final Clinical  Impression(s) / ED Diagnoses Final diagnoses:  Small bowel obstruction ([place])  Acute cystitis without hematuria    Rx / DC Orders ED Discharge Orders    None       Cynthia [name] 12/04/20 2156    Gareth Morgan, MD 12/05/20 1231

## 2020-12-05 DIAGNOSIS — I10 Essential (primary) hypertension: Secondary | ICD-10-CM

## 2020-12-05 DIAGNOSIS — K50019 Crohn's disease of small intestine with unspecified complications: Secondary | ICD-10-CM

## 2020-12-05 DIAGNOSIS — E876 Hypokalemia: Secondary | ICD-10-CM

## 2020-12-05 DIAGNOSIS — K56609 Unspecified intestinal obstruction, unspecified as to partial versus complete obstruction: Secondary | ICD-10-CM

## 2020-12-05 DIAGNOSIS — K50919 Crohn's disease, unspecified, with unspecified complications: Secondary | ICD-10-CM

## 2020-12-05 LAB — BASIC METABOLIC PANEL
Anion gap: 13 (ref 5–15)
BUN: 21 mg/dL (ref 8–23)
CO2: 25 mmol/L (ref 22–32)
Calcium: 7.9 mg/dL — ABNORMAL LOW (ref 8.9–10.3)
Chloride: 98 mmol/L (ref 98–111)
Creatinine, Ser: 0.82 mg/dL (ref 0.44–1.00)
GFR, Estimated: >60 mL/min (ref >60)
Glucose, Bld: 91 mg/dL (ref 70–99)
Potassium: 3.3 mmol/L — ABNORMAL LOW (ref 3.5–5.1)
Sodium: 136 mmol/L (ref 135–145)

## 2020-12-05 LAB — CBC
HCT: 44.2 % (ref 36.0–46.0)
Hemoglobin: 14.4 g/dL (ref 12.0–15.0)
MCH: 29.3 pg (ref 26.0–34.0)
MCHC: 32.6 g/dL (ref 30.0–36.0)
MCV: 89.8 fL (ref 80.0–100.0)
Platelets: 460 10*3/uL — ABNORMAL HIGH (ref 150–400)
RBC: 4.92 MIL/uL (ref 3.87–5.11)
RDW: 12.8 % (ref 11.5–15.5)
WBC: 14.2 10*3/uL — ABNORMAL HIGH (ref 4.0–10.5)
nRBC: 0 % (ref 0.0–0.2)

## 2020-12-05 LAB — MAGNESIUM: Magnesium: 1.8 mg/dL (ref 1.7–2.4)

## 2020-12-05 LAB — SARS CORONAVIRUS 2 (TAT 6-24 HRS): SARS Coronavirus 2: NEGATIVE

## 2020-12-05 LAB — HIV ANTIBODY (ROUTINE TESTING W REFLEX): HIV Screen 4th Generation wRfx: NONREACTIVE

## 2020-12-05 MED ORDER — PHENYLEPHRINE HCL 1 % NA SOLN
1.0000 [drp] | Freq: Four times a day (QID) | NASAL | Status: DC | PRN
  Administered 2020-12-06: 1 [drp] via NASAL
  Filled 2020-12-05 (×2): qty 15

## 2020-12-05 MED ORDER — METHYLPREDNISOLONE SODIUM SUCC 40 MG IJ SOLR
20.0000 mg | Freq: Two times a day (BID) | INTRAMUSCULAR | Status: DC
  Administered 2020-12-05 – 2020-12-08 (×6): 20 mg via INTRAVENOUS
  Filled 2020-12-05 (×6): qty 1

## 2020-12-05 MED ORDER — CIPROFLOXACIN IN D5W 200 MG/100ML IV SOLN
200.0000 mg | Freq: Two times a day (BID) | INTRAVENOUS | Status: DC
  Administered 2020-12-05: 200 mg via INTRAVENOUS
  Filled 2020-12-05 (×2): qty 100

## 2020-12-05 MED ORDER — METRONIDAZOLE IN NACL 5-0.79 MG/ML-% IV SOLN
500.0000 mg | Freq: Three times a day (TID) | INTRAVENOUS | Status: DC
  Administered 2020-12-05 – 2020-12-08 (×9): 500 mg via INTRAVENOUS
  Filled 2020-12-05 (×9): qty 100

## 2020-12-05 MED ORDER — MAGNESIUM SULFATE 2 GM/50ML IV SOLN
2.0000 g | Freq: Once | INTRAVENOUS | Status: AC
  Administered 2020-12-05: 2 g via INTRAVENOUS
  Filled 2020-12-05: qty 50

## 2020-12-05 MED ORDER — CIPROFLOXACIN IN D5W 400 MG/200ML IV SOLN
400.0000 mg | Freq: Two times a day (BID) | INTRAVENOUS | Status: DC
  Administered 2020-12-06 – 2020-12-08 (×5): 400 mg via INTRAVENOUS
  Filled 2020-12-05 (×6): qty 200

## 2020-12-05 NOTE — Consult Note (Signed)
[facility] Surgery Consult Note  Toniette Devera [facility] 1961-08-23  [id number].    Requesting MD: Posey Pronto, MD Chief Complaint/Reason for Consult: pSBO, Crohn's disease HPI:  Ms. 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 Pt reports 1-2 weeks of progressively worsening, cramping, abdominal pain and distention. Has had nausea and several episodes of vomiting over the last couple of days, as well as intolerance to PO intake. Reports some flatus yesterday but her last reported BM was 4 days ago. She currently gets Stelara injections monthly (next due 2/14) as well as PO steroids (down to prednisone 50m qod) for her Crohn's disease, followed by Dr. SFuller Plan   Patient denies sick contacts, fever, chills, SOB, CP, hematemesis, hematochezia, melena, dysuria, or hematuria.  Social hx: currently smokes cigarettes daily (1/2 ppd), quick drinking alcohol when she was diagnosed with Crohns,  Currently lives at home with a room mate and pseudo-fiance Abd surgeries: abdominal hysterectomy with LOA (2012), cystocele repair (laparoscopic + transvaginal, 2013). Blood thinning medications: none   ROS: As above  Review of Systems  Constitutional: Negative.   HENT: Positive for ear pain.   Respiratory: Negative.   Cardiovascular: Negative.   Gastrointestinal: Positive for abdominal pain, constipation, nausea and vomiting.  All other systems reviewed and are negative.  Family History  Problem Relation Age of Onset  . Diabetes Mother   . Dementia Mother   . Clotting disorder Father   . Macular degeneration Father   . Crohn's disease Sister   . Colon cancer Neg Hx   . Esophageal cancer Neg Hx   . Pancreatic cancer Neg Hx   . Stomach cancer Neg Hx   . Liver disease Neg Hx     Past Medical History:  Diagnosis  Date  . Anemia 2012  . Anxiety   . Bronchitis   . [place]'s ileitis ([place]   . Depression   . GERD (gastroesophageal reflux disease)   . Headache(784.0)    MIGRAINE  . Heart murmur   . Hiatal hernia   . Hypertension   . IBS (irritable bowel syndrome)   . Incontinence   . MVP (mitral valve prolapse)   . RLS (restless legs syndrome)   . Tubular adenoma of colon 08/2019    Past Surgical History:  Procedure Laterality Date  . ABDOMINAL HYSTERECTOMY  06/17/2011   Procedure: HYSTERECTOMY ABDOMINAL;  Surgeon: BArloa Koh  [facility];  Service: Gynecology;  Laterality: N/A;  Converted to Abdominal Hysterectomy with lysis of adhesions   . ANTERIOR AND POSTERIOR REPAIR  02/25/2012   Procedure: ANTERIOR (CYSTOCELE) AND POSTERIOR REPAIR (RECTOCELE);  Surgeon: MDaria Pastures MD;  [place];  Service: Gynecology;  Laterality: N/A;  ANterior cystocele repair ONLY  . BIOPSY  08/29/2019   Procedure: BIOPSY;  Surgeon: CLavena Bullion DO;  [facility];  Service: Gastroenterology;;  . BLADDER SUSPENSION  02/25/2012   Procedure: TRANSVAGINAL TAPE (TVT) PROCEDURE;  Surgeon: MDaria Pastures MD;  [place];  Service: Gynecology;  Laterality: N/A;  . COLONOSCOPY WITH PROPOFOL N/A 08/29/2019   Procedure: COLONOSCOPY WITH PROPOFOL;  Surgeon: CLavena Bullion DO;  [place]  Service: Gastroenterology;  Laterality: N/A;  . CYSTOSCOPY  02/25/2012   Procedure: CYSTOSCOPY;  Surgeon: MDaria Pastures MD;  [facility];  Service: Gynecology;  Laterality: N/A;  .  KNEE ARTHROSCOPY  2010  . LAPAROSCOPIC ASSISTED VAGINAL HYSTERECTOMY  06/17/2011   Procedure: LAPAROSCOPIC ASSISTED VAGINAL HYSTERECTOMY;  Surgeon: Arloa Koh;  Location: [place] [facility];  Service: Gynecology;  Laterality: N/A;  Attempted   . POLYPECTOMY  08/29/2019   Procedure: POLYPECTOMY;  Surgeon: Lavena Bullion, DO;  Location: [facility];  Service: Gastroenterology;;  .  SALPINGOOPHORECTOMY  06/17/2011   Procedure: SALPINGO OOPHERECTOMY;  Surgeon: Arloa Koh;  Location: [place] [facility];  Service: Gynecology;  Laterality: Bilateral;  . TONSILLECTOMY      Social History:  reports that she has been smoking cigarettes. She has been smoking about 0.50 packs per day. She has never used smokeless tobacco. She reports current alcohol use. She reports current drug use. Drug: Cocaine.  Allergies:  Allergies  Allergen Reactions  . Iodine     REACTION: in eye gtts Iodine eye drops; pt has no reactions to CT contrast    Medications Prior to Admission  Medication Sig Dispense Refill  . esomeprazole (NEXIUM) 40 MG capsule TAKE ONE (1) CAPSULE BY MOUTH 2 TIMES DAILY (Patient taking differently: Take 40 mg by mouth 2 (two) times daily before a meal.) 180 capsule 3  . hyoscyamine (LEVSIN) 0.125 MG tablet Take 0.125 mg by mouth every 4 (four) hours as needed for cramping.    Marland Kitchen losartan-hydrochlorothiazide (HYZAAR) 100-12.5 MG tablet Take 1 tablet by mouth daily. 90 tablet 3  . ondansetron (ZOFRAN-ODT) 4 MG disintegrating tablet Take 4 mg by mouth every 8 (eight) hours as needed for nausea or vomiting.    . pramipexole (MIRAPEX) 1 MG tablet Take 1 tablet by mouth daily (Patient taking differently: Take 1 mg by mouth daily.) 90 tablet 3  . predniSONE (DELTASONE) 10 MG tablet Take 20 mg by mouth daily.    Marland Kitchen venlafaxine XR (EFFEXOR-XR) 150 MG 24 hr capsule TAKE 2 CAPSULES BY MOUTH EVERY DAY (Patient taking differently: Take 300 mg by mouth daily with breakfast.) 180 capsule 0    Blood pressure 128/82, pulse 79, temperature (!) 97.5 F (36.4 C), temperature source Oral, resp. rate 20, height 5' 3"  (1.6 m), weight 63.6 kg, last menstrual period 06/10/2011, SpO2 100 %. Physical Exam: General: pleasant, WD/WN female who is laying in bed in NAD HEENT: head is normocephalic, atraumatic.  Sclera are noninjected.  Pupils equal and round.  Ears and nose without any masses or lesions.  Mouth  is pink and moist. Dentition fair Heart: regular, rate, and rhythm.  Normal s1,s2. No obvious murmurs, gallops, or rubs noted.  Palpable pedal pulses bilaterally  Lungs: CTAB, no wheezes, rhonchi, or rales noted.  Respiratory effort nonlabored Abd: soft, mild distension, minimal diffuse TTP without rebound or guarding, +BS, no masses, hernias, or organomegaly MS: no BUE/BLE edema, calves soft and nontender Skin: warm and dry with no masses, lesions, or rashes Psych: A&Ox4 with an appropriate affect Neuro: cranial nerves grossly intact, equal strength in BUE/BLE bilaterally, normal speech, thought process intact   Results for orders placed or performed during the hospital encounter of 12/04/20 (from the past 48 hour(s))  Lipase, blood     Status: None   Collection Time: 12/04/20  5:25 PM  Result Value Ref Range   Lipase 20 11 - 51 U/L    Comment: Performed at [facility], [place] [address] [place], [place] [address]  Comprehensive metabolic panel     Status: Abnormal   Collection Time: 12/04/20  5:25 PM  Result Value Ref Range   Sodium 134 (  L) 135 - 145 mmol/L   Potassium 3.4 (L) 3.5 - 5.1 mmol/L   Chloride 95 (L) 98 - 111 mmol/L   CO2 24 22 - 32 mmol/L   Glucose, Bld 144 (H) 70 - 99 mg/dL    Comment: Glucose reference range applies only to samples taken after fasting for at least 8 hours.   BUN 23 8 - 23 mg/dL   Creatinine, Ser 1.01 (H) 0.44 - 1.00 mg/dL   Calcium 8.7 (L) 8.9 - 10.3 mg/dL   Total Protein 8.1 6.5 - 8.1 g/dL   Albumin 3.4 (L) 3.5 - 5.0 g/dL   AST 30 15 - 41 U/L   ALT 24 0 - 44 U/L   Alkaline Phosphatase 90 38 - 126 U/L   Total Bilirubin 0.9 0.3 - 1.2 mg/dL   GFR, Estimated >60 >60 mL/min    Comment: (NOTE) Calculated using the CKD-EPI Creatinine Equation (2021)    Anion gap 15 5 - 15    Comment: Performed at [facility], [place] [address], [place], [place] [address]  CBC     Status: Abnormal   Collection Time: 12/04/20  5:25 PM   Result Value Ref Range   WBC 17.6 (H) 4.0 - 10.5 K/uL   RBC 5.82 (H) 3.87 - 5.11 MIL/uL   Hemoglobin 16.8 (H) 12.0 - 15.0 g/dL   HCT 51.5 (H) 36.0 - 46.0 %   MCV 88.5 80.0 - 100.0 fL   MCH 28.9 26.0 - 34.0 pg   MCHC 32.6 30.0 - 36.0 g/dL   RDW 12.7 11.5 - 15.5 %   Platelets 624 (H) 150 - 400 K/uL   nRBC 0.0 0.0 - 0.2 %    Comment: Performed at [facility], [place] [address], [place], [place] [address]  Urinalysis, Routine w reflex microscopic Urine, Clean Catch     Status: Abnormal   Collection Time: 12/04/20  5:27 PM  Result Value Ref Range   Color, Urine AMBER (A) YELLOW    Comment: BIOCHEMICALS MAY BE AFFECTED BY COLOR   APPearance CLOUDY (A) CLEAR   Specific Gravity, Urine 1.031 (H) 1.005 - 1.030   pH 5.0 5.0 - 8.0   Glucose, UA NEGATIVE NEGATIVE mg/dL   Hgb urine dipstick NEGATIVE NEGATIVE   Bilirubin Urine NEGATIVE NEGATIVE   Ketones, ur 5 (A) NEGATIVE mg/dL   Protein, ur 30 (A) NEGATIVE mg/dL   Nitrite POSITIVE (A) NEGATIVE   Leukocytes,Ua LARGE (A) NEGATIVE   RBC / HPF 6-10 0 - 5 RBC/hpf   WBC, UA 21-50 0 - 5 WBC/hpf   Bacteria, UA MANY (A) NONE SEEN   Squamous Epithelial / LPF 11-20 0 - 5   Mucus PRESENT    Hyaline Casts, UA PRESENT     Comment: Performed at [facility], [place] [address] [place], [place] [address]  SARS CORONAVIRUS 2 (TAT 6-24 HRS) Nasopharyngeal Nasopharyngeal Swab     Status: None   Collection Time: 12/04/20  8:38 PM   Specimen: Nasopharyngeal Swab  Result Value Ref Range   SARS Coronavirus 2 NEGATIVE NEGATIVE    Comment: (NOTE) SARS-CoV-2 target nucleic acids are NOT DETECTED.  The SARS-CoV-2 RNA is generally detectable in upper and lower respiratory specimens during the acute phase of infection. Negative results do not preclude SARS-CoV-2 infection, do not rule out co-infections with other pathogens, and should not be used as the sole basis for treatment or other patient management decisions. Negative  results must be combined with  clinical observations, patient history, and epidemiological information. The expected result is Negative.  Fact Sheet for Patients: SugarRoll.be  Fact Sheet for Healthcare Providers: https://www.woods-mathews.com/  This test is not yet approved or cleared by the [place] FDA and  has been authorized for detection and/or diagnosis of SARS-CoV-2 by FDA under an Emergency Use Authorization (EUA). This EUA will remain  in effect (meaning this test can be used) for the duration of the COVID-19 declaration under Se ction 564(b)(1) of the Act, 21 U.S.C. section 360bbb-3(b)(1), unless the authorization is terminated or revoked sooner.  Performed at [facility], [place] [address], [place], [place] [address]   Magnesium     Status: None   Collection Time: 12/05/20  3:23 AM  Result Value Ref Range   Magnesium 1.8 1.7 - 2.4 mg/dL    Comment: Performed at [facility], [place] [address], [place], [place] [address]  CBC     Status: Abnormal   Collection Time: 12/05/20  3:23 AM  Result Value Ref Range   WBC 14.2 (H) 4.0 - 10.5 K/uL   RBC 4.92 3.87 - 5.11 MIL/uL   Hemoglobin 14.4 12.0 - 15.0 g/dL   HCT 44.2 36.0 - 46.0 %   MCV 89.8 80.0 - 100.0 fL   MCH 29.3 26.0 - 34.0 pg   MCHC 32.6 30.0 - 36.0 g/dL   RDW 12.8 11.5 - 15.5 %   Platelets 460 (H) 150 - 400 K/uL   nRBC 0.0 0.0 - 0.2 %    Comment: Performed at [facility], [place] [address], [place], [place] [address]  Basic metabolic panel     Status: Abnormal   Collection Time: 12/05/20  3:23 AM  Result Value Ref Range   Sodium 136 135 - 145 mmol/L   Potassium 3.3 (L) 3.5 - 5.1 mmol/L   Chloride 98 98 - 111 mmol/L   CO2 25 22 - 32 mmol/L   Glucose, Bld 91 70 - 99 mg/dL    Comment: Glucose reference range applies only to samples taken after fasting for at least 8 hours.   BUN 21 8 - 23 mg/dL   Creatinine, Ser 0.82 0.44 -  1.00 mg/dL   Calcium 7.9 (L) 8.9 - 10.3 mg/dL   GFR, Estimated >60 >60 mL/min    Comment: (NOTE) Calculated using the CKD-EPI Creatinine Equation (2021)    Anion gap 13 5 - 15    Comment: Performed at [facility], [place], [place] [address], [place],  [address]   DG Abdomen 1 View  Result Date: 12/04/2020 CLINICAL DATA:  Check gastric catheter placement EXAM: ABDOMEN - 1 VIEW COMPARISON:  CT from earlier in the same day. FINDINGS: Gastric catheter is noted with the tip at the gastroesophageal junction. Proximal side port lies in the distal esophagus. This should be advanced several cm deeper into the stomach. Persistent small bowel dilatation is noted consistent with that seen on recent CT examination. IMPRESSION: Gastric catheter as described which should be advanced several cm deeper into the stomach. Persistent small bowel obstruction. Electronically Signed   By: Inez Catalina M.D.   On: 12/04/2020 23:19   CT ABDOMEN PELVIS W CONTRAST  Result Date: 12/04/2020 CLINICAL DATA:  60 year old female with abdominal pain. EXAM: CT ABDOMEN AND PELVIS WITH CONTRAST TECHNIQUE: Multidetector CT imaging of the abdomen and pelvis was performed using the standard protocol following bolus administration of intravenous contrast. CONTRAST:  128m OMNIPAQUE IOHEXOL 300 MG/ML  SOLN COMPARISON:  CT abdomen pelvis dated 06/27/2020. FINDINGS: Lower chest: The visualized lung bases are clear. No intra-abdominal free air.  Small free fluid in the pelvis. Hepatobiliary: The liver is unremarkable. No intrahepatic biliary dilatation. The gallbladder is mildly distended. No calcified gallstone or pericholecystic fluid. Pancreas: Unremarkable. No pancreatic ductal dilatation or surrounding inflammatory changes. Spleen: Normal in size without focal abnormality. Adrenals/Urinary Tract: The adrenal glands unremarkable. There is no hydronephrosis on either side. There is symmetric enhancement and excretion of contrast  by both kidneys. The visualized ureters and urinary bladder appear unremarkable. Stomach/Bowel: There is diffuse dilatation of small bowel measuring up to 5.5 cm in caliber. There is a transition in the distal ileum within the pelvis were there is mild thickening of the bowel loops and severe narrowing of the bowel lumen consistent with stricture. There is a transition within the pelvis (85/5 and 67/2). There is tethering of adjacent small bowel loops in this region consistent with adhesions. An enteroenteric fistula is not excluded. The colon is unremarkable. The appendix is normal. Vascular/Lymphatic: The abdominal aorta and IVC unremarkable. No portal venous gas. No adenopathy. Reproductive: Hysterectomy.  No adnexal masses. Other: None Musculoskeletal: Degenerative changes of the spine. No acute osseous pathology. IMPRESSION: High-grade distal small-bowel obstruction with transition within the pelvis secondary to structures an adhesion. Electronically Signed   By: Anner Crete M.D.   On: 12/04/2020 20:15   Assessment/Plan HTN Depression/anxiety GERD Tobacco abuse  UTI - UA w/ leuks, nitrites, bacteria, culture pending; was treated with one dose of Rocephin  Crohn's disease with enteroenteric and enterocolonic fistulae  pSBO due to Crohn's - afebrile, VSS, WBC 14 from 17 - recommend GI consult for further laboratory testing and recommendations for the medical management of Crohn's - continue NG tube to LIWS and monitor bowel function - may benefit from repeat CT scan of the abd/pelvis with PO contrast this admission depending on her clinical SBO course, would hold off on this study for right now and just allow bowel rest and GI consult.  FEN: NPO, IVF, NG to LIWS  ID: Rocephin 2/1 x 1 dose (UTI), no abx recommended from surgical perspective  VTE: SCD's, Lovenox Foley: none Dispo: Med-surg, GI consult  Wellington Hampshire, PA-C [facility] Please see Amion for pager number  during day hours 7:00am-4:30pm 12/05/2020, 10:40 AM

## 2020-12-05 NOTE — Plan of Care (Signed)

## 2020-12-05 NOTE — Progress Notes (Signed)
PHARMACY NOTE:  ANTIMICROBIAL RENAL DOSAGE ADJUSTMENT  Current antimicrobial regimen includes a mismatch between antimicrobial dosage and estimated renal function.  As per policy approved by the Pharmacy & Therapeutics and Medical Executive Committees, the antimicrobial dosage will be adjusted accordingly.  Current antimicrobial dosage:  Cipro 200 mg IV q12h   Indication: Crohn's ileitis  Renal Function:  Estimated Creatinine Clearance: 64.7 mL/min (by C-G formula based on SCr of 0.82 mg/dL). []      On intermittent HD, scheduled: []      On CRRT    Antimicrobial dosage has been changed to:   Cipro 400 mg IV q12h  - also more in line with GI note which states " SBO possible secondary to Crohn's with persistent inflammation, will plan to start IV Solu-Medrol 20 mg twice daily Cipro 500 mg twice daily and Flagyl 500 mg 3 times daily for 10-day course. "   Additional comments:   Thank you for allowing pharmacy to be a part of this patient's care.    Royetta Asal, PharmD, BCPS 12/05/2020 7:24 PM

## 2020-12-05 NOTE — Progress Notes (Addendum)
Initial Nutrition Assessment  RD working remotely.  DOCUMENTATION CODES:   Not applicable  INTERVENTION:  - diet advancement as medically feasible. - will monitor for need for nutrition support - will complete NFPE at follow-up.   NUTRITION DIAGNOSIS:   Inadequate oral intake related to inability to eat as evidenced by NPO status.  GOAL:   Patient will meet greater than or equal to 90% of their needs  MONITOR:   Diet advancement,Labs,Weight trends,I & O's  REASON FOR ASSESSMENT:   Malnutrition Screening Tool  ASSESSMENT:   60 y.o. female with medical history of fistulizing Crohn's ileocolitis on stelara, HTN, depression, and anxiety. She presented to the ED due to abdominal pain and N/V x1-2 weeks and decreased oral intake x3-4 days. CT abdomen/pelvis with contrast showed high-grade distal SBO with transition within the pelvis 2/2 strictures and adhesions. General surgery was consulted who recommended NG tube placement.  She has been NPO since admission. NGT placed in R nare yesterday at ~2253. Abdominal xray report indicates gastric placement. Flow sheet indicates 650 ml output since NGT placement.   Weight today is 140 lb, weight yesterday was 141 lb, weight on 1/25 was 145 lb indicating 5 lb weight loss (3.4% body weight) in the past 1 week which is significant for time frame.   Per notes: - SBO with Surgery and GI following - possible enteroenteric fistula with extensive adhesions - leukocytosis - asymptomatic bacteriuria   Labs reviewed; K: 3.3 mmol/l, Ca: 7.9 mg/dl. Medications reviewed; 2 g IV Mg sulfate x1 run 2/2, 20 mg solu-medrol BID. IVF; NS-40 mEq IV KCl @ 100 ml/hr.    NUTRITION - FOCUSED PHYSICAL EXAM:  unable to complete at this time.   Diet Order:   Diet Order            Diet NPO time specified  Diet effective now                 EDUCATION NEEDS:   Not appropriate for education at this time  Skin:  Skin Assessment: Reviewed RN  Assessment  Last BM:  PTA/unknown  Height:   Ht Readings from Last 1 Encounters:  12/05/20 5' 3"  (1.6 m)    Weight:   Wt Readings from Last 1 Encounters:  12/05/20 63.6 kg    Estimated Nutritional Needs:  Kcal:  1600-1800 kcal Protein:  75-85 grams Fluid:  >/= 2.2 L/day      Jarome Matin, MS, RD, LDN, CNSC Inpatient Clinical Dietitian RD pager # available in AMION  After hours/weekend pager # available in [facility]

## 2020-12-05 NOTE — Progress Notes (Addendum)
PROGRESS NOTE  Cynthia [name] YOV:[id number] DOB: 08-26-1961 DOA: 12/04/2020 PCP: Denita Lung, MD   LOS: 1 day   Brief narrative:  Cynthia [name] is a 60 y.o. female with medical history significant for fistulizing Crohn's ileocolitis, hypertension, and depression/anxiety who presented to the ED for evaluation of abdominal pain, nausea, and vomiting for 1-1/2-week with decreased oral intake for 3 to 4 days.  Patient was following up with GI, Dr. Fuller Plan and has been on Stelara.    Patient was seen in GI clinic and due to concern for bowel obstruction she was sent to the ED for further evaluation and management.  In the ED vitals were stable.  WBC was elevated at 17.6 with mild thrombocytosis.  There was mild hypokalemia with potassium of 3.4.  Lipase was within normal limits including LFTs. Urinalysis shows positive nitrites, large leukocytes, 6-10 RBC/hpf, 21-50 WBC/hpf, many bacteria microscopy. CT abdomen/pelvis with contrast shows high-grade distal small bowel obstruction with transition within the pelvis secondary to strictures and adhesions. Patient was given 1 L normal saline, IV ceftriaxone, IV Zofran, IV morphine.    General surgery was consulted who recommended NG tube placement.  Patient was then admitted to the hospital.  Covid swab was negative.   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  General surgery on board at this time.  On NG tube.  Continue IV fluids and p.o. supportive care.  Follow surgical and GI recommendations.  She has been started on Solu-Medrol IV twice daily.  Leukocytosis: Mild.  Could be reactive.  Continue to monitor closely.   Leukocytosis trending down.  On ciprofloxacin and metronidazole.  GI on board.  Essential hypertension: Blood pressure seems to be stable at this time.  We will continue to hold oral antihypertensives.  Hypokalemia: Continue supplementation with IV fluids.  Check BMP in a.m.  Asymptomatic bacteriuria: Received 1 dose IV ceftriaxone in the ED.  Hold further antibiotics at this time.  Urine culture was sent from the ED.  DVT prophylaxis: enoxaparin (LOVENOX) injection 40 mg Start: 12/04/20 2315  Code Status: Full code  Family Communication: Spoke with the patient's  fianc at bedside  Status is: Inpatient  Remains inpatient appropriate because:IV treatments appropriate due to intensity of illness or inability to take PO and Inpatient level of care appropriate due to severity of illness, high-grade bowel obstruction, IV fluids NG tube, GI and surgical evaluation.   Dispo: The patient is from: Home              Anticipated d/c is to: Home              Anticipated d/c date is: 3 days              Patient currently is not medically stable to d/c.   Difficult to place patient No   Consultants:  General surgery  GI   Procedures:  NG tube placement.  Anti-infectives:  Marland Kitchen Ceftriaxone x1  Anti-infectives (From admission, onward)   Start     Dose/Rate Route Frequency Ordered Stop   12/04/20 2045  cefTRIAXone (ROCEPHIN) 1 g in sodium chloride 0.9 % 100 mL IVPB        1 g 200 mL/hr  over 30 Minutes Intravenous  Once 12/04/20 2036 12/04/20 2217     Subjective: Today, patient was seen and examined at bedside.  Patient denies any nausea vomiting.  On NG tube.  Has intermittent abdominal pain.  Has been passing some flatus but no bowel movement.  Planes of the left ear discomfort from recent ear infection  Objective: Vitals:   12/05/20 0010 12/05/20 0414  BP: (!) 128/99 118/90  Pulse: (!) 105 97  Resp: 18 16  Temp: 97.7 F (36.5 C) 97.7 F (36.5 C)  SpO2: 99% 97%     Intake/Output Summary (Last 24 hours) at 12/05/2020 0806 Last data filed at 12/05/2020 0740 Gross per 24 hour  Intake 1689.04 ml  Output 975 ml  Net 714.04 ml   Filed Weights   12/04/20 2004 12/05/20 0000  Weight: 63.8 kg 63.6 kg   Body mass index is 24.84 kg/m.   Physical Exam:  GENERAL: Patient is alert awake and oriented. Not in obvious distress.  NG tube in place HENT: No scleral pallor or icterus. Pupils equally reactive to light. Oral mucosa is moist NECK: is supple, no gross swelling noted. CHEST: Clear to auscultation. No crackles or wheezes.  Diminished breath sounds bilaterally. CVS: S1 and S2 heard, no murmur. Regular rate and rhythm.  ABDOMEN: Soft, mild tenderness over the periumbilical region, bowel sounds are present. EXTREMITIES: No edema. CNS: Cranial nerves are intact. No focal motor deficits. SKIN: warm and dry without rashes.  Data Review: I have personally reviewed the following laboratory data and studies,  CBC: Recent Labs  Lab 12/04/20 1725 12/05/20 0323  WBC 17.6* 14.2*  HGB 16.8* 14.4  HCT 51.5* 44.2  MCV 88.5 89.8  PLT 624* 973*   Basic Metabolic Panel: Recent Labs  Lab 12/04/20 1725 12/05/20 0323  NA 134* 136  K 3.4* 3.3*  CL 95* 98  CO2 24 25  GLUCOSE 144* 91  BUN 23 21  CREATININE 1.01* 0.82  CALCIUM 8.7* 7.9*  MG  --  1.8   Liver Function Tests: Recent Labs  Lab 12/04/20 1725  AST 30  ALT 24  ALKPHOS 90  BILITOT 0.9  PROT 8.1  ALBUMIN 3.4*   Recent Labs  Lab 12/04/20 1725  LIPASE 20   No results for input(s): AMMONIA in the last 168 hours. Cardiac Enzymes: No results for input(s): CKTOTAL, CKMB, CKMBINDEX, TROPONINI in the last 168 hours. BNP (last 3 results) No results for input(s): BNP in the last 8760 hours.  ProBNP (last 3 results) No results for input(s): PROBNP in the last 8760 hours.  CBG: No results for input(s): GLUCAP in the last 168 hours. Recent Results (from the past 240 hour(s))  Novel  Coronavirus, NAA ([facility])     Status: None   Collection Time: 11/27/20 11:00 AM   Specimen: Nasopharyngeal(NP) swabs in vial transport medium  Result Value Ref Range Status   SARS-CoV-2, NAA Not Detected Not Detected Final    Comment: This nucleic acid amplification test was developed and its performance characteristics determined by [employer]. Nucleic acid amplification tests include RT-PCR and TMA. This test has not been FDA cleared or approved. This test has been authorized by FDA under an Emergency Use Authorization (EUA). This test is only authorized for the duration of time the declaration that circumstances exist justifying the authorization of the emergency use of in vitro diagnostic tests for detection of SARS-CoV-2 virus and/or diagnosis of COVID-19 infection under section 564(b)(1) of the Act, 21 U.S.C. 532DJM-4(Q) (1),  unless the authorization is terminated or revoked sooner. When diagnostic testing is negative, the possibility of a false negative result should be considered in the context of a patient's recent exposures and the presence of clinical signs and symptoms consistent with COVID-19. An individual without symptoms of COVID-19 and who is not shedding SARS-CoV-2 virus wo uld expect to have a negative (not detected) result in this assay.   SARS-COV-2, NAA 2 DAY TAT     Status: None   Collection Time: 11/27/20 11:00 AM  Result Value Ref Range Status   SARS-CoV-2, NAA 2 DAY TAT Performed  Final  SARS CORONAVIRUS 2 (TAT 6-24 HRS) Nasopharyngeal Nasopharyngeal Swab     Status: None   Collection Time: 12/04/20  8:38 PM   Specimen: Nasopharyngeal Swab  Result Value Ref Range Status   SARS Coronavirus 2 NEGATIVE NEGATIVE Final    Comment: (NOTE) SARS-CoV-2 target nucleic acids are NOT DETECTED.  The SARS-CoV-2 RNA is generally detectable in upper and lower respiratory specimens during the acute phase of infection. Negative results do not preclude  SARS-CoV-2 infection, do not rule out co-infections with other pathogens, and should not be used as the sole basis for treatment or other patient management decisions. Negative results must be combined with clinical observations, patient history, and epidemiological information. The expected result is Negative.  Fact Sheet for Patients: SugarRoll.be  Fact Sheet for Healthcare Providers: https://www.woods-mathews.com/  This test is not yet approved or cleared by the [place] FDA and  has been authorized for detection and/or diagnosis of SARS-CoV-2 by FDA under an Emergency Use Authorization (EUA). This EUA will remain  in effect (meaning this test can be used) for the duration of the COVID-19 declaration under Se ction 564(b)(1) of the Act, 21 U.S.C. section 360bbb-3(b)(1), unless the authorization is terminated or revoked sooner.  Performed at [facility], [place] [address], [place], [place] [address]      Studies: DG Abdomen 1 View  Result Date: 12/04/2020 CLINICAL DATA:  Check gastric catheter placement EXAM: ABDOMEN - 1 VIEW COMPARISON:  CT from earlier in the same day. FINDINGS: Gastric catheter is noted with the tip at the gastroesophageal junction. Proximal side port lies in the distal esophagus. This should be advanced several cm deeper into the stomach. Persistent small bowel dilatation is noted consistent with that seen on recent CT examination. IMPRESSION: Gastric catheter as described which should be advanced several cm deeper into the stomach. Persistent small bowel obstruction. Electronically Signed   By: Inez Catalina M.D.   On: 12/04/2020 23:19   CT ABDOMEN PELVIS W CONTRAST  Result Date: 12/04/2020 CLINICAL DATA:  60 year old female with abdominal pain. EXAM: CT ABDOMEN AND PELVIS WITH CONTRAST TECHNIQUE: Multidetector CT imaging of the abdomen and pelvis was performed using the standard protocol following bolus  administration of intravenous contrast. CONTRAST:  179m OMNIPAQUE IOHEXOL 300 MG/ML  SOLN COMPARISON:  CT abdomen pelvis dated 06/27/2020. FINDINGS: Lower chest: The visualized lung bases are clear. No intra-abdominal free air.  Small free fluid in the pelvis. Hepatobiliary: The liver is unremarkable. No intrahepatic biliary dilatation. The gallbladder is mildly distended. No calcified gallstone or pericholecystic fluid. Pancreas: Unremarkable. No pancreatic ductal dilatation or surrounding inflammatory changes. Spleen: Normal in size without focal abnormality. Adrenals/Urinary Tract: The adrenal glands unremarkable. There is no hydronephrosis on either side. There is symmetric enhancement and excretion of contrast by both kidneys. The visualized ureters and urinary bladder appear unremarkable. Stomach/Bowel: There is diffuse dilatation of small bowel  measuring up to 5.5 cm in caliber. There is a transition in the distal ileum within the pelvis were there is mild thickening of the bowel loops and severe narrowing of the bowel lumen consistent with stricture. There is a transition within the pelvis (85/5 and 67/2). There is tethering of adjacent small bowel loops in this region consistent with adhesions. An enteroenteric fistula is not excluded. The colon is unremarkable. The appendix is normal. Vascular/Lymphatic: The abdominal aorta and IVC unremarkable. No portal venous gas. No adenopathy. Reproductive: Hysterectomy.  No adnexal masses. Other: None Musculoskeletal: Degenerative changes of the spine. No acute osseous pathology. IMPRESSION: High-grade distal small-bowel obstruction with transition within the pelvis secondary to structures an adhesion. Electronically Signed   By: Anner Crete M.D.   On: 12/04/2020 20:15      Cynthia Lipps, MD  [facility] 12/05/2020  If 7PM-7AM, please contact night-coverage

## 2020-12-05 NOTE — Consult Note (Addendum)
Referring Provider: No ref. provider found Primary Care Physician:  Denita Lung, MD Primary Gastroenterologist:  Dr. Fuller Plan  Reason for Consultation:  Crohn's disease. SBO  HPI: Cynthia [name] is a 60 y.o. female with PMH listed below, which includes Crohn's ileitis diagnosed in 08/2019.  Was treated with Humira in the past now on Stelara, of which she just completed her initiation doses.  Last dose of Stelara was around January 3.  Tells me that her next dose is due February 14.  Currently, she is taking 10 mg of Prednisone every other day (I think this is still from the taper in November timeframe).  Presented to outpatient clinic yesterday with 3 days history of severe abdominal pain, no BMs.  Suspicion for SBO as she had one in August 2021.  Was sent to the ED.  CT scan of the abdomen and pelvis with contrast revealed the following:  IMPRESSION: High-grade distal small-bowel obstruction with transition within the pelvis secondary to structures an adhesion.  NGT in place and has suctioned a large amount of brownish fluid.  She is feeling better with NGT in place.   Colonoscopy 08/2019:  - There was significant looping of the colon. - Two 3 to 4 mm polyps in the rectum and at the recto-sigmoid colon, removed with a cold snare. Resected and retrieved. - Vascular-pattern-decreased mucosa in the descending colon, in the transverse colon, in the ascending colon and in the cecum. Biopsied. - Ileitis. Biopsied. Clinical presentation and endoscopic appearance highly suspicious for Crohns Disease.   A. COLON, ILEUM, BIOPSY:  Small bowel mucosa with granulation tissue and exudate consistent with  ulcer.  Differential includes Crohn's.  No granulomas identified.   B. COLON, ASCENDING, BIOPSY:  Colonic mucosa with no significant pathologic changes.  No active inflammation, chronic changes or granulomas.   C. COLON, TRANSVERSE, BIOPSY:  Colonic mucosa with no significant  pathologic changes.  No active inflammation, chronic changes or granulomas.   D. COLON, DESCENDING, BIOPSY:  Colonic mucosa with no significant pathologic changes.  No active inflammation, chronic changes or granulomas.   E. COLON, SIGMOID, BIOPSY:  Colonic mucosa with no significant pathologic changes.  No active inflammation, chronic changes or granulomas.   F. RECTUM, BIOPSY:  Colonic mucosa with no significant pathologic changes.  Noactive inflammation, chronic changes or granulomas.   G. RECTUM, POLYPECTOMY:  Tubular adenoma (s).  No high-grade dysplasia or carcinoma.    Past Medical History:  Diagnosis Date  . Anemia 2012  . Anxiety   . Bronchitis   . Crohn's ileitis ([place])   . Depression   . GERD (gastroesophageal reflux disease)   . Headache(784.0)    MIGRAINE  . Heart murmur   . Hiatal hernia   . Hypertension   . IBS (irritable bowel syndrome)   . Incontinence   . MVP (mitral valve prolapse)   . RLS (restless legs syndrome)   . Tubular adenoma of colon 08/2019    Past Surgical History:  Procedure Laterality Date  . ABDOMINAL HYSTERECTOMY  06/17/2011   Procedure: HYSTERECTOMY ABDOMINAL;  Surgeon: Arloa Koh;  Location: [facility];  Service: Gynecology;  Laterality: N/A;  Converted to Abdominal Hysterectomy with lysis of adhesions   . ANTERIOR AND POSTERIOR REPAIR  02/25/2012   Procedure: ANTERIOR (CYSTOCELE) AND POSTERIOR REPAIR (RECTOCELE);  Surgeon: Daria Pastures, MD;  Location: [facility];  Service: Gynecology;  Laterality: N/A;  ANterior cystocele repair ONLY  . BIOPSY  08/29/2019   Procedure: BIOPSY;  Surgeon: Lavena Bullion, DO;  Location: [facility];  Service: Gastroenterology;;  . BLADDER SUSPENSION  02/25/2012   Procedure: TRANSVAGINAL TAPE (TVT) PROCEDURE;  Surgeon: Daria Pastures, MD;  Location: [facility];  Service: Gynecology;  Laterality: N/A;  . COLONOSCOPY WITH PROPOFOL N/A 08/29/2019   Procedure: COLONOSCOPY WITH PROPOFOL;  Surgeon:  Lavena Bullion, DO;  Location: [facility];  Service: Gastroenterology;  Laterality: N/A;  . CYSTOSCOPY  02/25/2012   Procedure: CYSTOSCOPY;  Surgeon: Daria Pastures, MD;  Location: [facility];  Service: Gynecology;  Laterality: N/A;  . KNEE ARTHROSCOPY  2010  . LAPAROSCOPIC ASSISTED VAGINAL HYSTERECTOMY  06/17/2011   Procedure: LAPAROSCOPIC ASSISTED VAGINAL HYSTERECTOMY;  Surgeon: Arloa Koh;  Location: [facility];  Service: Gynecology;  Laterality: N/A;  Attempted   . POLYPECTOMY  08/29/2019   Procedure: POLYPECTOMY;  Surgeon: Lavena Bullion, DO;  Location: [facility];  Service: Gastroenterology;;  . SALPINGOOPHORECTOMY  06/17/2011   Procedure: SALPINGO OOPHERECTOMY;  Surgeon: Arloa Koh;  Location: [facility];  Service: Gynecology;  Laterality: Bilateral;  . TONSILLECTOMY      Prior to Admission medications   Medication Sig Start Date End Date Taking? Authorizing Provider  esomeprazole (NEXIUM) 40 MG capsule TAKE ONE (1) CAPSULE BY MOUTH 2 TIMES DAILY Patient taking differently: Take 40 mg by mouth 2 (two) times daily before a meal. 07/03/20  Yes Kc, Ramesh, MD  hyoscyamine (LEVSIN) 0.125 MG tablet Take 0.125 mg by mouth every 4 (four) hours as needed for cramping.   Yes [provider]  losartan-hydrochlorothiazide (HYZAAR) 100-12.5 MG tablet Take 1 tablet by mouth daily. 08/03/20  Yes Denita Lung, MD  ondansetron (ZOFRAN-ODT) 4 MG disintegrating tablet Take 4 mg by mouth every 8 (eight) hours as needed for nausea or vomiting.   Yes [provider]  pramipexole (MIRAPEX) 1 MG tablet Take 1 tablet by mouth daily Patient taking differently: Take 1 mg by mouth daily. 03/26/20  Yes Denita Lung, MD  predniSONE (DELTASONE) 10 MG tablet Take 20 mg by mouth daily.   Yes [provider]  venlafaxine XR (EFFEXOR-XR) 150 MG 24 hr capsule TAKE 2 CAPSULES BY MOUTH EVERY DAY Patient taking differently: Take 300 mg by mouth daily with breakfast. 07/26/20  Yes  Denita Lung, MD    Current Facility-Administered Medications  Medication Dose Route Frequency Provider Last Rate Last Admin  . 0.9 % NaCl with KCl 40 mEq / L  infusion   Intravenous Continuous Zada Finders R, MD 100 mL/hr at 12/05/20 0100 New Bag at 12/05/20 0100  . enoxaparin (LOVENOX) injection 40 mg  40 mg Subcutaneous Q24H Zada Finders R, MD   40 mg at 12/05/20 0020  . morphine 2 MG/ML injection 2 mg  2 mg Intravenous Q3H PRN Lenore Cordia, MD   2 mg at 12/05/20 0742  . ondansetron (ZOFRAN) tablet 4 mg  4 mg Oral Q6H PRN Lenore Cordia, MD       Or  . ondansetron (ZOFRAN) injection 4 mg  4 mg Intravenous Q6H PRN Lenore Cordia, MD        Allergies as of 12/04/2020 - Review Complete 12/04/2020  Allergen Reaction Noted  . Iodine  03/17/2008    Family History  Problem Relation Age of Onset  . Diabetes Mother   . Dementia Mother   . Clotting disorder Father   . Macular degeneration Father   . Crohn's disease Sister   . Colon cancer Neg Hx   .  Esophageal cancer Neg Hx   . Pancreatic cancer Neg Hx   . Stomach cancer Neg Hx   . Liver disease Neg Hx     Social History   Socioeconomic History  . Marital status: Widowed    Spouse name: Not on file  . Number of children: 1  . Years of education: Not on file  . Highest education level: Not on file  Occupational History  . Occupation: Designer, television/film set: [employer]  Tobacco Use  . Smoking status: Current Every Day Smoker    Packs/day: 0.50    Types: Cigarettes    Last attempt to quit: 12/07/2011    Years since quitting: 9.0  . Smokeless tobacco: Never Used  . Tobacco comment: none in 2 weeks as of 12/04/20  Vaping Use  . Vaping Use: Never used  Substance and Sexual Activity  . Alcohol use: Yes    Comment: socially; seldom  . Drug use: Yes    Types: Cocaine    Comment: socially; d/c 3 weeks ago per patient 05/31/20  . Sexual activity: Not Currently  Other Topics Concern  . Not on file  Social History  Narrative   Lives with boyfriend who she states is an alcoholic   Social Determinants of Radio broadcast assistant Strain: Not on file  Food Insecurity: Not on file  Transportation Needs: Not on file  Physical Activity: Not on file  Stress: Not on file  Social Connections: Not on file  Intimate Partner Violence: Not on file    Review of Systems: ROS is O/W negative except as mentioned in HPI.  Physical Exam: Vital signs in last 24 hours: Temp:  [97.5 F (36.4 C)-98.1 F (36.7 C)] 97.5 F (36.4 C) (02/02 0958) Pulse Rate:  [79-130] 79 (02/02 0958) Resp:  [11-20] 20 (02/02 0958) BP: (100-137)/(50-109) 128/82 (02/02 0958) SpO2:  [93 %-100 %] 100 % (02/02 0958) Weight:  [63.6 kg-63.8 kg] 63.6 kg (02/02 0000) Last BM Date: 12/01/20 General:  Alert, Well-developed, well-nourished, pleasant and cooperative in NAD Head:  Normocephalic and atraumatic. Eyes:  Sclera clear, no icterus.  Conjunctiva pink. Ears:  Normal auditory acuity. Mouth:  No deformity or lesions.   Lungs:  Clear throughout to auscultation.  No wheezes, crackles, or rhonchi.  Heart:  Regular rate and rhythm; no murmurs, clicks, rubs, or gallops. Abdomen:  Soft, non-distended.  BS present, high-pitched and tinkling.  Minimal TTP. Msk:  Symmetrical without gross deformities. Pulses:  Normal pulses noted. Extremities:  Without clubbing or edema. Neurologic:  Alert and oriented x 4;  grossly normal neurologically. Skin:  Intact without significant lesions or rashes. Psych:  Alert and cooperative. Normal mood and affect.  Intake/Output from previous day: 02/01 0701 - 02/02 0700 In: 1414 [I.V.:414; IV Piggyback:1000] Out: 850 [Urine:800; Emesis/NG output:50] Intake/Output this shift: Total I/O In: 650 [I.V.:580; NG/GT:20; IV Piggyback:50] Out: 125 [Urine:125]  Lab Results: Recent Labs    12/04/20 1725 12/05/20 0323  WBC 17.6* 14.2*  HGB 16.8* 14.4  HCT 51.5* 44.2  PLT 624* 460*   BMET Recent Labs     12/04/20 1725 12/05/20 0323  NA 134* 136  K 3.4* 3.3*  CL 95* 98  CO2 24 25  GLUCOSE 144* 91  BUN 23 21  CREATININE 1.01* 0.82  CALCIUM 8.7* 7.9*   LFT Recent Labs    12/04/20 1725  PROT 8.1  ALBUMIN 3.4*  AST 30  ALT 24  ALKPHOS 90  BILITOT 0.9  Studies/Results: DG Abdomen 1 View  Result Date: 12/04/2020 CLINICAL DATA:  Check gastric catheter placement EXAM: ABDOMEN - 1 VIEW COMPARISON:  CT from earlier in the same day. FINDINGS: Gastric catheter is noted with the tip at the gastroesophageal junction. Proximal side port lies in the distal esophagus. This should be advanced several cm deeper into the stomach. Persistent small bowel dilatation is noted consistent with that seen on recent CT examination. IMPRESSION: Gastric catheter as described which should be advanced several cm deeper into the stomach. Persistent small bowel obstruction. Electronically Signed   By: Inez Catalina M.D.   On: 12/04/2020 23:19   CT ABDOMEN PELVIS W CONTRAST  Result Date: 12/04/2020 CLINICAL DATA:  60 year old female with abdominal pain. EXAM: CT ABDOMEN AND PELVIS WITH CONTRAST TECHNIQUE: Multidetector CT imaging of the abdomen and pelvis was performed using the standard protocol following bolus administration of intravenous contrast. CONTRAST:  193m OMNIPAQUE IOHEXOL 300 MG/ML  SOLN COMPARISON:  CT abdomen pelvis dated 06/27/2020. FINDINGS: Lower chest: The visualized lung bases are clear. No intra-abdominal free air.  Small free fluid in the pelvis. Hepatobiliary: The liver is unremarkable. No intrahepatic biliary dilatation. The gallbladder is mildly distended. No calcified gallstone or pericholecystic fluid. Pancreas: Unremarkable. No pancreatic ductal dilatation or surrounding inflammatory changes. Spleen: Normal in size without focal abnormality. Adrenals/Urinary Tract: The adrenal glands unremarkable. There is no hydronephrosis on either side. There is symmetric enhancement and excretion of  contrast by both kidneys. The visualized ureters and urinary bladder appear unremarkable. Stomach/Bowel: There is diffuse dilatation of small bowel measuring up to 5.5 cm in caliber. There is a transition in the distal ileum within the pelvis were there is mild thickening of the bowel loops and severe narrowing of the bowel lumen consistent with stricture. There is a transition within the pelvis (85/5 and 67/2). There is tethering of adjacent small bowel loops in this region consistent with adhesions. An enteroenteric fistula is not excluded. The colon is unremarkable. The appendix is normal. Vascular/Lymphatic: The abdominal aorta and IVC unremarkable. No portal venous gas. No adenopathy. Reproductive: Hysterectomy.  No adnexal masses. Other: None Musculoskeletal: Degenerative changes of the spine. No acute osseous pathology. IMPRESSION: High-grade distal small-bowel obstruction with transition within the pelvis secondary to structures an adhesion. Electronically Signed   By: AAnner CreteM.D.   On: 12/04/2020 20:15   IMPRESSION:  1.  Crohn's ileitis: Currently on Stelara, next dose 12/17/2020, started with abdominal pain over the past 5 days which has been increasing in severity, now with no bowel movement in 3 days and limited amounts of flatus.  Has been on prednisone 10 mg every other day at home as well. 2.  SBO:  High-grade distal SBO with transition in the pelvis.  CT reads mild thickening of the distal ileum with severe narrowing of the bowel lumen c/w stricture.  NGT is in place.  Surgery has been consulted.  Has history of the same in August 2021.  PLAN: -Continue with supportive care with NGT, IV hydration, bowel rest, etc. -Appreciate surgery input. -Will discuss with Dr. NSilverio Decampregarding starting IV steroids to treat any acute inflammation for a couple of days and see if it will open up at all, but may ultimately require surgery.   JLaban Emperor Zehr  12/05/2020, 10:59 AM   Attending  physician's note   I have taken a history, examined the patient and reviewed the chart. I agree with the Advanced Practitioner's note, impression and recommendations.  60year old very pleasant  female with history of Crohn's ileitis, initially diagnosed in October 2020, recently started on Stelara admitted with small bowel obstruction. 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 NG tube to suction with bowel rest IV fluids  Complaints of earache, she was prescribed antibiotics by her primary care doctor, does not recall the name. She has requested her husband to bring in the medication.  She is currently on broad-spectrum antibiotics.  GI will continue to follow along   The patient was provided an opportunity to ask questions and all were answered. The patient agreed with the plan and demonstrated an understanding of the instructions.  Damaris Hippo , MD [phone]

## 2020-12-06 DIAGNOSIS — K50012 Crohn's disease of small intestine with intestinal obstruction: Secondary | ICD-10-CM

## 2020-12-06 DIAGNOSIS — K50119 Crohn's disease of large intestine with unspecified complications: Secondary | ICD-10-CM

## 2020-12-06 DIAGNOSIS — H9209 Otalgia, unspecified ear: Secondary | ICD-10-CM

## 2020-12-06 LAB — CBC
HCT: 49 % — ABNORMAL HIGH (ref 36.0–46.0)
Hemoglobin: 15.1 g/dL — ABNORMAL HIGH (ref 12.0–15.0)
MCH: 29 pg (ref 26.0–34.0)
MCHC: 30.8 g/dL (ref 30.0–36.0)
MCV: 94 fL (ref 80.0–100.0)
Platelets: 386 10*3/uL (ref 150–400)
RBC: 5.21 MIL/uL — ABNORMAL HIGH (ref 3.87–5.11)
RDW: 12.8 % (ref 11.5–15.5)
WBC: 10.3 10*3/uL (ref 4.0–10.5)
nRBC: 0 % (ref 0.0–0.2)

## 2020-12-06 LAB — COMPREHENSIVE METABOLIC PANEL
ALT: 18 U/L (ref 0–44)
AST: 20 U/L (ref 15–41)
Albumin: 2.9 g/dL — ABNORMAL LOW (ref 3.5–5.0)
Alkaline Phosphatase: 74 U/L (ref 38–126)
Anion gap: 15 (ref 5–15)
BUN: 20 mg/dL (ref 8–23)
CO2: 21 mmol/L — ABNORMAL LOW (ref 22–32)
Calcium: 8.2 mg/dL — ABNORMAL LOW (ref 8.9–10.3)
Chloride: 101 mmol/L (ref 98–111)
Creatinine, Ser: 0.74 mg/dL (ref 0.44–1.00)
GFR, Estimated: >60 mL/min (ref >60)
Glucose, Bld: 68 mg/dL — ABNORMAL LOW (ref 70–99)
Potassium: 3.5 mmol/L (ref 3.5–5.1)
Sodium: 137 mmol/L (ref 135–145)
Total Bilirubin: 0.6 mg/dL (ref 0.3–1.2)
Total Protein: 6.7 g/dL (ref 6.5–8.1)

## 2020-12-06 LAB — MAGNESIUM: Magnesium: 2.2 mg/dL (ref 1.7–2.4)

## 2020-12-06 LAB — PHOSPHORUS: Phosphorus: 2.5 mg/dL (ref 2.5–4.6)

## 2020-12-06 MED ORDER — PANTOPRAZOLE SODIUM 40 MG IV SOLR
40.0000 mg | INTRAVENOUS | Status: DC
  Administered 2020-12-06 – 2020-12-08 (×3): 40 mg via INTRAVENOUS
  Filled 2020-12-06 (×3): qty 40

## 2020-12-06 MED ORDER — DIPHENHYDRAMINE HCL 50 MG/ML IJ SOLN
25.0000 mg | Freq: Once | INTRAMUSCULAR | Status: AC
  Administered 2020-12-08: 25 mg via INTRAVENOUS
  Filled 2020-12-06: qty 1

## 2020-12-06 MED ORDER — ZOLPIDEM TARTRATE 5 MG PO TABS
5.0000 mg | ORAL_TABLET | Freq: Every evening | ORAL | Status: DC | PRN
  Administered 2020-12-06: 5 mg via ORAL
  Filled 2020-12-06: qty 1

## 2020-12-06 NOTE — Progress Notes (Addendum)
[facility] Progress Note  CC:  Crohn's disease, SBO  Subjective:  Feels ok.  Still with a lot of output from NGT but surgery is recommending clamping trial with small amounts of clears.  Passed some flatus but no stool.    Objective:  Vital signs in last 24 hours: Temp:  [97.5 F (36.4 C)-97.7 F (36.5 C)] 97.7 F (36.5 C) (02/03 0546) Pulse Rate:  [79-80] 79 (02/03 0546) Resp:  [16-20] 16 (02/03 0546) BP: (126-152)/(82-86) 140/84 (02/03 0546) SpO2:  [99 %-100 %] 100 % (02/03 0546) Last BM Date: 12/02/20 General:  Alert, Well-developed, in NAD; tearful and emotional today Heart:  Regular rate and rhythm; no murmurs Pulm:  CTAB.  No W/R/R. Abdomen:  Soft, non-distended.  BS present, very loud and hyperactive c/w bowel obstrcution.  Non-tender. Extremities:  Without edema. Neurologic:  Alert and oriented x 4;  grossly normal neurologically. Psych:  Alert and cooperative. Normal mood and affect.  Intake/Output from previous day: 02/02 0701 - 02/03 0700 In: 3163.3 [P.O.:300; I.V.:2468.3; NG/GT:45; IV Piggyback:350] Out: 1600 [Urine:700; Emesis/NG output:900] Intake/Output this shift: Total I/O In: -  Out: 100 [Urine:100]  Lab Results: Recent Labs    12/04/20 1725 12/05/20 0323 12/06/20 0306  WBC 17.6* 14.2* 10.3  HGB 16.8* 14.4 15.1*  HCT 51.5* 44.2 49.0*  PLT 624* 460* 386   BMET Recent Labs    12/04/20 1725 12/05/20 0323 12/06/20 0306  NA 134* 136 137  K 3.4* 3.3* 3.5  CL 95* 98 101  CO2 24 25 21*  GLUCOSE 144* 91 68*  BUN 23 21 20   CREATININE 1.01* 0.82 0.74  CALCIUM 8.7* 7.9* 8.2*   LFT Recent Labs    12/06/20 0306  PROT 6.7  ALBUMIN 2.9*  AST 20  ALT 18  ALKPHOS 74  BILITOT 0.6   DG Abdomen 1 View  Result Date: 12/04/2020 CLINICAL DATA:  Check gastric catheter placement EXAM: ABDOMEN - 1 VIEW COMPARISON:  CT from earlier in the same day. FINDINGS: Gastric catheter is noted with the tip at the gastroesophageal junction.  Proximal side port lies in the distal esophagus. This should be advanced several cm deeper into the stomach. Persistent small bowel dilatation is noted consistent with that seen on recent CT examination. IMPRESSION: Gastric catheter as described which should be advanced several cm deeper into the stomach. Persistent small bowel obstruction. Electronically Signed   By: Inez Catalina M.D.   On: 12/04/2020 23:19   CT ABDOMEN PELVIS W CONTRAST  Result Date: 12/04/2020 CLINICAL DATA:  60 year old female with abdominal pain. EXAM: CT ABDOMEN AND PELVIS WITH CONTRAST TECHNIQUE: Multidetector CT imaging of the abdomen and pelvis was performed using the standard protocol following bolus administration of intravenous contrast. CONTRAST:  187mL OMNIPAQUE IOHEXOL 300 MG/ML  SOLN COMPARISON:  CT abdomen pelvis dated 06/27/2020. FINDINGS: Lower chest: The visualized lung bases are clear. No intra-abdominal free air.  Small free fluid in the pelvis. Hepatobiliary: The liver is unremarkable. No intrahepatic biliary dilatation. The gallbladder is mildly distended. No calcified gallstone or pericholecystic fluid. Pancreas: Unremarkable. No pancreatic ductal dilatation or surrounding inflammatory changes. Spleen: Normal in size without focal abnormality. Adrenals/Urinary Tract: The adrenal glands unremarkable. There is no hydronephrosis on either side. There is symmetric enhancement and excretion of contrast by both kidneys. The visualized ureters and urinary bladder appear unremarkable. Stomach/Bowel: There is diffuse dilatation of small bowel measuring up to 5.5 cm in caliber. There is a transition in the distal ileum within  the pelvis were there is mild thickening of the bowel loops and severe narrowing of the bowel lumen consistent with stricture. There is a transition within the pelvis (85/5 and 67/2). There is tethering of adjacent small bowel loops in this region consistent with adhesions. An enteroenteric fistula is not  excluded. The colon is unremarkable. The appendix is normal. Vascular/Lymphatic: The abdominal aorta and IVC unremarkable. No portal venous gas. No adenopathy. Reproductive: Hysterectomy.  No adnexal masses. Other: None Musculoskeletal: Degenerative changes of the spine. No acute osseous pathology. IMPRESSION: High-grade distal small-bowel obstruction with transition within the pelvis secondary to structures an adhesion. Electronically Signed   By: Anner Crete M.D.   On: 12/04/2020 20:15   Assessment / Plan: 1.Crohn's ileitis:Currently on Stelara, next dose 12/17/2020, started with abdominal pain over the past 5 days which has been increasing in severity, now with no bowel movement in 3 days and limited amounts of flatus.  Had been on prednisone 10 mg every other day at home as well. 2.  SBO:  High-grade distal SBO with transition in the pelvis.  CT reads mild thickening of the distal ileum with severe narrowing of the bowel lumen c/w stricture and possible enteroenteric fistula.  NGT is in place.  Surgery has been consulted.  Has history of the same in August 2021.  -Started IV solumedrol 20 mg IV BID on 2/2.  Continue for now.   -Started Cipro IV BID and Flagyl IV TID, should complete 10 days. -Continue with supportive care with NGT, IV hydration, bowel rest, etc per surgery's recommendations.    LOS: 2 days   Laban Emperor. Zehr  12/06/2020, 9:19 AM   Attending physician's note   I have taken an interval history, reviewed the chart and examined the patient. I agree with the Advanced Practitioner's note, impression and recommendations.    She is passing flatus, NG tube clamped by surgery and doing trials of clear liquids Continue IV Solu-Medrol 20 mg twice daily, Cipro and Flagyl Continue supportive care GI will continue to follow along   I have spent 25 minutes of patient care (this includes precharting, chart review, review of results, face-to-face time used for counseling as well as  treatment plan and follow-up. The patient was provided an opportunity to ask questions and all were answered. The patient agreed with the plan and demonstrated an understanding of the instructions.  Damaris Hippo , MD [phone]

## 2020-12-06 NOTE — Progress Notes (Signed)
Subjective: Patient feels better today.  Passing quite a bit of flatus when she stands or voids.  She has no pain and is soft.  Walking a lot in the halls  ROS: See above, otherwise other systems negative  Objective: Vital signs in last 24 hours: Temp:  [97.5 F (36.4 C)-97.7 F (36.5 C)] 97.7 F (36.5 C) (02/03 0546) Pulse Rate:  [79-80] 79 (02/03 0546) Resp:  [16-20] 16 (02/03 0546) BP: (126-152)/(82-86) 140/84 (02/03 0546) SpO2:  [99 %-100 %] 100 % (02/03 0546) Last BM Date: 12/02/20  Intake/Output from previous day: 02/02 0701 - 02/03 0700 In: 3163.3 [P.O.:300; I.V.:2468.3; NG/GT:45; IV Piggyback:350] Out: 1600 [Urine:700; Emesis/NG output:900] Intake/Output this shift: Total I/O In: -  Out: 100 [Urine:100]  PE: Gen: NAD Heart: regular Lungs: CTAB Abd: soft, NT, ND, +BS, NGT with old bloody output as NGT on continuous suction at 120.  This was changed to LIWS at around 100.  Lab Results:  Recent Labs    12/05/20 0323 12/06/20 0306  WBC 14.2* 10.3  HGB 14.4 15.1*  HCT 44.2 49.0*  PLT 460* 386   BMET Recent Labs    12/05/20 0323 12/06/20 0306  NA 136 137  K 3.3* 3.5  CL 98 101  CO2 25 21*  GLUCOSE 91 68*  BUN 21 20  CREATININE 0.82 0.74  CALCIUM 7.9* 8.2*   PT/INR No results for input(s): LABPROT, INR in the last 72 hours. CMP     Component Value Date/Time   NA 137 12/06/2020 0306   NA 142 08/03/2020 1315   K 3.5 12/06/2020 0306   CL 101 12/06/2020 0306   CO2 21 (L) 12/06/2020 0306   GLUCOSE 68 (L) 12/06/2020 0306   BUN 20 12/06/2020 0306   BUN 17 08/03/2020 1315   CREATININE 0.74 12/06/2020 0306   CREATININE 0.94 05/08/2016 0741   CALCIUM 8.2 (L) 12/06/2020 0306   PROT 6.7 12/06/2020 0306   PROT 6.6 08/03/2020 1315   ALBUMIN 2.9 (L) 12/06/2020 0306   ALBUMIN 3.7 (L) 08/03/2020 1315   AST 20 12/06/2020 0306   ALT 18 12/06/2020 0306   ALKPHOS 74 12/06/2020 0306   BILITOT 0.6 12/06/2020 0306   BILITOT <0.2 08/03/2020 1315    GFRNONAA >60 12/06/2020 0306   GFRAA 85 08/03/2020 1315   Lipase     Component Value Date/Time   LIPASE 20 12/04/2020 1725       Studies/Results: DG Abdomen 1 View  Result Date: 12/04/2020 CLINICAL DATA:  Check gastric catheter placement EXAM: ABDOMEN - 1 VIEW COMPARISON:  CT from earlier in the same day. FINDINGS: Gastric catheter is noted with the tip at the gastroesophageal junction. Proximal side port lies in the distal esophagus. This should be advanced several cm deeper into the stomach. Persistent small bowel dilatation is noted consistent with that seen on recent CT examination. IMPRESSION: Gastric catheter as described which should be advanced several cm deeper into the stomach. Persistent small bowel obstruction. Electronically Signed   By: Inez Catalina M.D.   On: 12/04/2020 23:19   CT ABDOMEN PELVIS W CONTRAST  Result Date: 12/04/2020 CLINICAL DATA:  60 year old female with abdominal pain. EXAM: CT ABDOMEN AND PELVIS WITH CONTRAST TECHNIQUE: Multidetector CT imaging of the abdomen and pelvis was performed using the standard protocol following bolus administration of intravenous contrast. CONTRAST:  163mL OMNIPAQUE IOHEXOL 300 MG/ML  SOLN COMPARISON:  CT abdomen pelvis dated 06/27/2020. FINDINGS: Lower chest: The visualized lung bases are clear.  No intra-abdominal free air.  Small free fluid in the pelvis. Hepatobiliary: The liver is unremarkable. No intrahepatic biliary dilatation. The gallbladder is mildly distended. No calcified gallstone or pericholecystic fluid. Pancreas: Unremarkable. No pancreatic ductal dilatation or surrounding inflammatory changes. Spleen: Normal in size without focal abnormality. Adrenals/Urinary Tract: The adrenal glands unremarkable. There is no hydronephrosis on either side. There is symmetric enhancement and excretion of contrast by both kidneys. The visualized ureters and urinary bladder appear unremarkable. Stomach/Bowel: There is diffuse dilatation of  small bowel measuring up to 5.5 cm in caliber. There is a transition in the distal ileum within the pelvis were there is mild thickening of the bowel loops and severe narrowing of the bowel lumen consistent with stricture. There is a transition within the pelvis (85/5 and 67/2). There is tethering of adjacent small bowel loops in this region consistent with adhesions. An enteroenteric fistula is not excluded. The colon is unremarkable. The appendix is normal. Vascular/Lymphatic: The abdominal aorta and IVC unremarkable. No portal venous gas. No adenopathy. Reproductive: Hysterectomy.  No adnexal masses. Other: None Musculoskeletal: Degenerative changes of the spine. No acute osseous pathology. IMPRESSION: High-grade distal small-bowel obstruction with transition within the pelvis secondary to structures an adhesion. Electronically Signed   By: Anner Crete M.D.   On: 12/04/2020 20:15    Anti-infectives: Anti-infectives (From admission, onward)   Start     Dose/Rate Route Frequency Ordered Stop   12/06/20 0600  ciprofloxacin (CIPRO) IVPB 400 mg        400 mg 200 mL/hr over 60 Minutes Intravenous Every 12 hours 12/05/20 1927     12/05/20 1700  ciprofloxacin (CIPRO) IVPB 200 mg  Status:  Discontinued        200 mg 100 mL/hr over 60 Minutes Intravenous Every 12 hours 12/05/20 1450 12/05/20 1927   12/05/20 1600  metroNIDAZOLE (FLAGYL) IVPB 500 mg        500 mg 100 mL/hr over 60 Minutes Intravenous Every 8 hours 12/05/20 1450     12/04/20 2045  cefTRIAXone (ROCEPHIN) 1 g in sodium chloride 0.9 % 100 mL IVPB        1 g 200 mL/hr over 30 Minutes Intravenous  Once 12/04/20 2036 12/04/20 2217       Assessment/Plan HTN Depression/anxiety GERD Tobacco abuse  UTI - UA w/ leuks, nitrites, bacteria, culture pending; was treated with one dose of Rocephin  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 - passing flatus today but no BMs.  Will clamp NGT for now and let her try sips of clears from the floor. - cont abx therapy per GI recs -would like to avoid surgery if she resolves, but if not given severe narrowing, may need surgery.  FEN: NPO, IVF, NGT clamped, sips of clears from floor ID: Rocephin 2/1 x 1 dose (UTI), C/F per GI recs VTE: SCD's, Lovenox Foley: none    LOS: 2 days    Henreitta Cea , [facility] Surgery 12/06/2020, 9:46 AM Please see Amion for pager number during day hours 7:00am-4:30pm or 7:00am -11:30am on weekends

## 2020-12-06 NOTE — Progress Notes (Signed)
PROGRESS NOTE  Cynthia [name] MWN:[id number] DOB: 01/08/1961 DOA: 12/04/2020 PCP: Denita Lung, MD   LOS: 2 days   Brief narrative:  Cynthia [name] is a 60 y.o. female with medical history significant for fistulizing Crohn's ileocolitis, hypertension, and depression/anxiety who presented to the ED for evaluation of abdominal pain, nausea, and vomiting for 1-1/2-week with decreased oral intake for 3 to 4 days.  Patient was following up with GI, Dr. Fuller Plan and has been on Stelara.    Patient was seen in GI clinic and due to concern for bowel obstruction she was sent to the ED for further evaluation and management.  In the ED vitals were stable.  WBC was elevated at 17.6 with mild thrombocytosis.  There was mild hypokalemia with potassium of 3.4.  Lipase was within normal limits including LFTs. Urinalysis shows positive nitrites, large leukocytes, 6-10 RBC/hpf, 21-50 WBC/hpf, many bacteria microscopy. CT abdomen/pelvis with contrast shows high-grade distal small bowel obstruction with transition within the pelvis secondary to strictures and adhesions. Patient was given 1 L normal saline, IV ceftriaxone, IV Zofran, IV morphine.    General surgery was consulted who recommended NG tube placement.  Patient was then admitted to the hospital.  Covid swab was negative.   Assessment/Plan:  Principal Problem:   Small bowel obstruction (HCC) Active Problems:   Hypertension   Crohn's disease with complication (HCC)   Hypokalemia   High-grade distal small bowel obstruction with history of Crohn's ileocolitis with a history of a distal ileal stricture and enteroenteric and enterocolonic fistulization. On Stelara and prednisone as outpatient.  General surgery on board.  On NG tube.  Continue IV fluids supportive care.  Follow surgical and GI recommendations.  on Solu-Medrol IV twice daily.  Continue ciprofloxacin and metronidazole plan for 10-day course  Ear ache.  Recent ear infection.  Exacerbated by  NG tube.  We will add decongestant.  Might need ENT evaluation as outpatient.  Leukocytosis: Mild. Improved.    On ciprofloxacin and metronidazole. On IV steroids.  Will monitor.  Essential hypertension: continue to hold oral antihypertensives. PRN antihypertensives   Hypokalemia: KCL with IV fluids. K+ of 3.5 today.  Continue to monitor.  Asymptomatic bacteriuria: Received 1 dose IV ceftriaxone in the ED.   Urine culture with gm negative rods. Already on cipro  DVT prophylaxis: enoxaparin (LOVENOX) injection 40 mg Start: 12/04/20 2315  Code Status: Full code  Family Communication: Spoke with the patient at bedside.  Status is: Inpatient  Remains inpatient appropriate because:IV treatments appropriate due to intensity of illness or inability to take PO and Inpatient level of care appropriate due to severity of illness, high-grade bowel obstruction, IV fluids NG tube, GI and surgical evaluation.   Dispo: The patient is from: Home              Anticipated d/c is to: Home              Anticipated d/c date is: 3 days or more              Patient currently is not medically stable to d/c.   Difficult to place patient No   Consultants:  General surgery  GI   Procedures:  NG tube placement.  Anti-infectives:  Marland Kitchen Ceftriaxone x1 . Cipro 2/2> . Flagyl 2/2>  Anti-infectives (From admission, onward)   Start     Dose/Rate Route Frequency Ordered Stop   12/06/20 0600  ciprofloxacin (CIPRO) IVPB 400 mg  400 mg 200 mL/hr over 60 Minutes Intravenous Every 12 hours 12/05/20 1927     12/05/20 1700  ciprofloxacin (CIPRO) IVPB 200 mg  Status:  Discontinued        200 mg 100 mL/hr over 60 Minutes Intravenous Every 12 hours 12/05/20 1450 12/05/20 1927   12/05/20 1600  metroNIDAZOLE (FLAGYL) IVPB 500 mg        500 mg 100 mL/hr over 60 Minutes Intravenous Every 8 hours 12/05/20 1450     12/04/20 2045  cefTRIAXone (ROCEPHIN) 1 g in sodium chloride 0.9 % 100 mL IVPB        1  g 200 mL/hr over 30 Minutes Intravenous  Once 12/04/20 2036 12/04/20 2217     Subjective: Today, patient was seen and examined at bedside. Feels thirsty and hungry, no abdominal pain. Had flatus but no bowel movement. No nausea or vomiting.  Has mild left earache better than yesterday.   Objective: Vitals:   12/05/20 2155 12/06/20 0546  BP: (!) 152/86 140/84  Pulse: 79 79  Resp: 16 16  Temp: (!) 97.5 F (36.4 C) 97.7 F (36.5 C)  SpO2: 100% 100%    Intake/Output Summary (Last 24 hours) at 12/06/2020 0935 Last data filed at 12/06/2020 0836 Gross per 24 hour  Intake 2838.33 ml  Output 1575 ml  Net 1263.33 ml   Filed Weights   12/04/20 2004 12/05/20 0000  Weight: 63.8 kg 63.6 kg   Body mass index is 24.84 kg/m.   Physical Exam:  GENERAL: Patient is alert awake and oriented. Not in obvious distress.  NG tube in place HENT: No scleral pallor or icterus. Pupils equally reactive to light. Oral mucosa is moist.  External ear and canal okay NECK: is supple, no gross swelling noted. CHEST: Clear to auscultation. No crackles or wheezes.  Diminished breath sounds bilaterally. CVS: S1 and S2 heard, no murmur. Regular rate and rhythm.  ABDOMEN: Soft, mild tenderness over the periumbilical region, bowel sounds are present. EXTREMITIES: No edema. CNS: Cranial nerves are intact. No focal motor deficits. SKIN: warm and dry without rashes.  Data Review: I have personally reviewed the following laboratory data and studies,  CBC: Recent Labs  Lab 12/04/20 1725 12/05/20 0323 12/06/20 0306  WBC 17.6* 14.2* 10.3  HGB 16.8* 14.4 15.1*  HCT 51.5* 44.2 49.0*  MCV 88.5 89.8 94.0  PLT 624* 460* 211   Basic Metabolic Panel: Recent Labs  Lab 12/04/20 1725 12/05/20 0323 12/06/20 0306  NA 134* 136 137  K 3.4* 3.3* 3.5  CL 95* 98 101  CO2 24 25 21*  GLUCOSE 144* 91 68*  BUN 23 21 20   CREATININE 1.01* 0.82 0.74  CALCIUM 8.7* 7.9* 8.2*  MG  --  1.8 2.2  PHOS  --   --  2.5   Liver  Function Tests: Recent Labs  Lab 12/04/20 1725 12/06/20 0306  AST 30 20  ALT 24 18  ALKPHOS 90 74  BILITOT 0.9 0.6  PROT 8.1 6.7  ALBUMIN 3.4* 2.9*   Recent Labs  Lab 12/04/20 1725  LIPASE 20   No results for input(s): AMMONIA in the last 168 hours. Cardiac Enzymes: No results for input(s): CKTOTAL, CKMB, CKMBINDEX, TROPONINI in the last 168 hours. BNP (last 3 results) No results for input(s): BNP in the last 8760 hours.  ProBNP (last 3 results) No results for input(s): PROBNP in the last 8760 hours.  CBG: No results for input(s): GLUCAP in the last 168 hours. Recent Results (from  the past 240 hour(s))  Novel Coronavirus, NAA ([facility])     Status: None   Collection Time: 11/27/20 11:00 AM   Specimen: Nasopharyngeal(NP) swabs in vial transport medium  Result Value Ref Range Status   SARS-CoV-2, NAA Not Detected Not Detected Final    Comment: This nucleic acid amplification test was developed and its performance characteristics determined by [employer]. Nucleic acid amplification tests include RT-PCR and TMA. This test has not been FDA cleared or approved. This test has been authorized by FDA under an Emergency Use Authorization (EUA). This test is only authorized for the duration of time the declaration that circumstances exist justifying the authorization of the emergency use of in vitro diagnostic tests for detection of SARS-CoV-2 virus and/or diagnosis of COVID-19 infection under section 564(b)(1) of the Act, 21 U.S.C. 229NLG-9(Q) (1), unless the authorization is terminated or revoked sooner. When diagnostic testing is negative, the possibility of a false negative result should be considered in the context of a patient's recent exposures and the presence of clinical signs and symptoms consistent with COVID-19. An individual without symptoms of COVID-19 and who is not shedding SARS-CoV-2 virus wo uld expect to have a negative (not detected) result in this  assay.   SARS-COV-2, NAA 2 DAY TAT     Status: None   Collection Time: 11/27/20 11:00 AM  Result Value Ref Range Status   SARS-CoV-2, NAA 2 DAY TAT Performed  Final  Urine culture     Status: Abnormal (Preliminary result)   Collection Time: 12/04/20  6:14 PM   Specimen: Urine, Random  Result Value Ref Range Status   Specimen Description   Final    URINE, RANDOM Performed at [facility] [address] [place], [place] [address]    Special Requests   Final    NONE Performed at [facility], [place] [address], [place], [place] [address]    Culture >=100,000 COLONIES/mL GRAM NEGATIVE RODS (A)  Final   Report Status PENDING  Incomplete  SARS CORONAVIRUS 2 (TAT 6-24 HRS) Nasopharyngeal Nasopharyngeal Swab     Status: None   Collection Time: 12/04/20  8:38 PM   Specimen: Nasopharyngeal Swab  Result Value Ref Range Status   SARS Coronavirus 2 NEGATIVE NEGATIVE Final    Comment: (NOTE) SARS-CoV-2 target nucleic acids are NOT DETECTED.  The SARS-CoV-2 RNA is generally detectable in upper and lower respiratory specimens during the acute phase of infection. Negative results do not preclude SARS-CoV-2 infection, do not rule out co-infections with other pathogens, and should not be used as the sole basis for treatment or other patient management decisions. Negative results must be combined with clinical observations, patient history, and epidemiological information. The expected result is Negative.  Fact Sheet for Patients: SugarRoll.be  Fact Sheet for Healthcare Providers: https://www.woods-mathews.com/  This test is not yet approved or cleared by the [place] FDA and  has been authorized for detection and/or diagnosis of SARS-CoV-2 by FDA under an Emergency Use Authorization (EUA). This EUA will remain  in effect (meaning this test can be used) for the duration of the COVID-19 declaration under Se  ction 564(b)(1) of the Act, 21 U.S.C. section 360bbb-3(b)(1), unless the authorization is terminated or revoked sooner.  Performed at [facility], [place] [address], [place], [place] [address]      Studies: DG Abdomen 1 View  Result Date: 12/04/2020 CLINICAL DATA:  Check gastric catheter placement EXAM: ABDOMEN - 1 VIEW COMPARISON:  CT from earlier  in the same day. FINDINGS: Gastric catheter is noted with the tip at the gastroesophageal junction. Proximal side port lies in the distal esophagus. This should be advanced several cm deeper into the stomach. Persistent small bowel dilatation is noted consistent with that seen on recent CT examination. IMPRESSION: Gastric catheter as described which should be advanced several cm deeper into the stomach. Persistent small bowel obstruction. Electronically Signed   By: Inez Catalina M.D.   On: 12/04/2020 23:19   CT ABDOMEN PELVIS W CONTRAST  Result Date: 12/04/2020 CLINICAL DATA:  60 year old female with abdominal pain. EXAM: CT ABDOMEN AND PELVIS WITH CONTRAST TECHNIQUE: Multidetector CT imaging of the abdomen and pelvis was performed using the standard protocol following bolus administration of intravenous contrast. CONTRAST:  176m OMNIPAQUE IOHEXOL 300 MG/ML  SOLN COMPARISON:  CT abdomen pelvis dated 06/27/2020. FINDINGS: Lower chest: The visualized lung bases are clear. No intra-abdominal free air.  Small free fluid in the pelvis. Hepatobiliary: The liver is unremarkable. No intrahepatic biliary dilatation. The gallbladder is mildly distended. No calcified gallstone or pericholecystic fluid. Pancreas: Unremarkable. No pancreatic ductal dilatation or surrounding inflammatory changes. Spleen: Normal in size without focal abnormality. Adrenals/Urinary Tract: The adrenal glands unremarkable. There is no hydronephrosis on either side. There is symmetric enhancement and excretion of contrast by both kidneys. The visualized ureters and urinary bladder appear  unremarkable. Stomach/Bowel: There is diffuse dilatation of small bowel measuring up to 5.5 cm in caliber. There is a transition in the distal ileum within the pelvis were there is mild thickening of the bowel loops and severe narrowing of the bowel lumen consistent with stricture. There is a transition within the pelvis (85/5 and 67/2). There is tethering of adjacent small bowel loops in this region consistent with adhesions. An enteroenteric fistula is not excluded. The colon is unremarkable. The appendix is normal. Vascular/Lymphatic: The abdominal aorta and IVC unremarkable. No portal venous gas. No adenopathy. Reproductive: Hysterectomy.  No adnexal masses. Other: None Musculoskeletal: Degenerative changes of the spine. No acute osseous pathology. IMPRESSION: High-grade distal small-bowel obstruction with transition within the pelvis secondary to structures an adhesion. Electronically Signed   By: AAnner CreteM.D.   On: 12/04/2020 20:15      LFlora Lipps MD  [facility] 12/06/2020  If 7PM-7AM, please contact night-coverage

## 2020-12-06 NOTE — Plan of Care (Signed)
  Problem: Health Behavior/Discharge Planning: Goal: Ability to manage health-related needs will improve Outcome: Progressing   Problem: Coping: Goal: Level of anxiety will decrease Outcome: Progressing   Problem: Pain Managment: Goal: General experience of comfort will improve Outcome: Progressing   

## 2020-12-06 NOTE — Plan of Care (Signed)
  Problem: Education: Goal: Knowledge of General Education information will improve Description: Including pain rating scale, medication(s)/side effects and non-pharmacologic comfort measures Outcome: Progressing   Problem: Elimination: Goal: Will not experience complications related to urinary retention Outcome: Progressing   Problem: Pain Managment: Goal: General experience of comfort will improve Outcome: Progressing

## 2020-12-07 LAB — COMPREHENSIVE METABOLIC PANEL
ALT: 18 U/L (ref 0–44)
AST: 20 U/L (ref 15–41)
Albumin: 2.6 g/dL — ABNORMAL LOW (ref 3.5–5.0)
Alkaline Phosphatase: 56 U/L (ref 38–126)
Anion gap: 11 (ref 5–15)
BUN: 13 mg/dL (ref 8–23)
CO2: 24 mmol/L (ref 22–32)
Calcium: 8.4 mg/dL — ABNORMAL LOW (ref 8.9–10.3)
Chloride: 102 mmol/L (ref 98–111)
Creatinine, Ser: 0.66 mg/dL (ref 0.44–1.00)
GFR, Estimated: >60 mL/min (ref >60)
Glucose, Bld: 115 mg/dL — ABNORMAL HIGH (ref 70–99)
Potassium: 4.5 mmol/L (ref 3.5–5.1)
Sodium: 137 mmol/L (ref 135–145)
Total Bilirubin: 0.5 mg/dL (ref 0.3–1.2)
Total Protein: 5.9 g/dL — ABNORMAL LOW (ref 6.5–8.1)

## 2020-12-07 LAB — URINE CULTURE: Culture: >=100000 — AB

## 2020-12-07 LAB — CBC
HCT: 42.1 % (ref 36.0–46.0)
Hemoglobin: 13.6 g/dL (ref 12.0–15.0)
MCH: 29.2 pg (ref 26.0–34.0)
MCHC: 32.3 g/dL (ref 30.0–36.0)
MCV: 90.3 fL (ref 80.0–100.0)
Platelets: 306 10*3/uL (ref 150–400)
RBC: 4.66 MIL/uL (ref 3.87–5.11)
RDW: 12.9 % (ref 11.5–15.5)
WBC: 9.6 10*3/uL (ref 4.0–10.5)
nRBC: 0 % (ref 0.0–0.2)

## 2020-12-07 LAB — PHOSPHORUS: Phosphorus: 1.7 mg/dL — ABNORMAL LOW (ref 2.5–4.6)

## 2020-12-07 LAB — MAGNESIUM: Magnesium: 1.7 mg/dL (ref 1.7–2.4)

## 2020-12-07 MED ORDER — SODIUM CHLORIDE 0.9 % IV SOLN: INTRAVENOUS | Status: AC

## 2020-12-07 MED ORDER — PRAMIPEXOLE DIHYDROCHLORIDE 0.25 MG PO TABS
1.0000 mg | ORAL_TABLET | Freq: Every day | ORAL | Status: DC
  Administered 2020-12-07 – 2020-12-09 (×3): 1 mg via ORAL
  Filled 2020-12-07 (×3): qty 4

## 2020-12-07 MED ORDER — SODIUM PHOSPHATES 45 MMOLE/15ML IV SOLN
30.0000 mmol | Freq: Once | INTRAVENOUS | Status: AC
  Administered 2020-12-07: 30 mmol via INTRAVENOUS
  Filled 2020-12-07: qty 10

## 2020-12-07 MED ORDER — ACETAMINOPHEN 325 MG PO TABS: 650.0000 mg | ORAL_TABLET | Freq: Four times a day (QID) | ORAL | Status: DC | PRN

## 2020-12-07 MED ORDER — VENLAFAXINE HCL ER 150 MG PO CP24
300.0000 mg | ORAL_CAPSULE | Freq: Every day | ORAL | Status: DC
  Administered 2020-12-08 – 2020-12-09 (×2): 300 mg via ORAL
  Filled 2020-12-07 (×2): qty 2

## 2020-12-07 MED ORDER — HYDROCHLOROTHIAZIDE 12.5 MG PO CAPS
12.5000 mg | ORAL_CAPSULE | Freq: Every day | ORAL | Status: DC
  Administered 2020-12-07 – 2020-12-09 (×3): 12.5 mg via ORAL
  Filled 2020-12-07 (×3): qty 1

## 2020-12-07 MED ORDER — LOSARTAN POTASSIUM 50 MG PO TABS
100.0000 mg | ORAL_TABLET | Freq: Every day | ORAL | Status: DC
  Administered 2020-12-07 – 2020-12-09 (×3): 100 mg via ORAL
  Filled 2020-12-07 (×3): qty 2

## 2020-12-07 MED ORDER — LOSARTAN POTASSIUM-HCTZ 100-12.5 MG PO TABS: 1.0000 | ORAL_TABLET | Freq: Every day | ORAL | Status: DC

## 2020-12-07 MED ORDER — BOOST / RESOURCE BREEZE PO LIQD CUSTOM
1.0000 | Freq: Three times a day (TID) | ORAL | Status: DC
  Administered 2020-12-07 – 2020-12-09 (×4): 1 via ORAL

## 2020-12-07 MED ORDER — DEXTROSE 5 % IV SOLN: 30.0000 mmol | Freq: Once | INTRAVENOUS | Status: DC

## 2020-12-07 NOTE — Progress Notes (Signed)
PROGRESS NOTE  Cynthia [name]:[id number] DOB: [birth year] DOA: 12/04/2020 PCP: Denita Lung, MD   LOS: 3 days   Brief narrative:  Cynthia [name] is a 60 y.o. female with medical history significant for fistulizing Crohn's ileocolitis, hypertension, and depression/anxiety who presented to the ED for evaluation of abdominal pain, nausea, and vomiting for 1-1/2-week with decreased oral intake for 3 to 4 days.  Patient was following up with GI, Dr. Fuller Plan and has been on Stelara.    Patient was seen in GI clinic and due to concern for bowel obstruction she was sent to the ED for further evaluation and management.  In the ED vitals were stable.  WBC was elevated at 17.6 with mild thrombocytosis.  There was mild hypokalemia with potassium of 3.4.  Lipase was within normal limits including LFTs. Urinalysis shows positive nitrites, large leukocytes, 6-10 RBC/hpf, 21-50 WBC/hpf, many bacteria microscopy. CT abdomen/pelvis with contrast shows high-grade distal small bowel obstruction with transition within the pelvis secondary to strictures and adhesions. Patient was given 1 L normal saline, IV ceftriaxone, IV Zofran, IV morphine.    General surgery was consulted who recommended NG tube placement.  Patient was then admitted to the hospital.  Covid swab was negative.  After hospitalization, patient continued to feel better with the passage of bowel movements and flatus.  NG tube was subsequently clamped off and patient was started on clears.  General surgery following.   Assessment/Plan:  Principal Problem:   Small bowel obstruction (HCC) Active Problems:   Hypertension   Crohn's disease with complication (HCC)   Hypokalemia  High-grade distal small bowel obstruction with history of Crohn's ileocolitis with a history of a distal ileal stricture and enteroenteric and enterocolonic fistulization.improving at this time.  Patient was initially on NG tube which has been clamped.  General surgery and  GI following.  Patient was on on Stelara and prednisone as outpatient.  on Solu-Medrol IV twice daily.  Started on clears today.  Follow surgical recommendations.  Leukocytosis: Mild.  Likely reactive.  Trended down.  On ciprofloxacin and metronidazole.  GI on board.  Essential hypertension: oral antihypertensives continue.  Closely monitor..  Hypokalemia: Resolved with replacement.  Hypophosphatemia will replenish through [facility]-Ph[facility].  Check levels in a.m.  Asymptomatic bacteriuria: Urine culture showed E. coli.  No symptoms.  Already on ciprofloxacin.    DVT prophylaxis: enoxaparin (LOVENOX) injection 40 mg Start: 12/04/20 2315  Code Status: Full code  Family Communication: None today.     Status is: Inpatient  Remains inpatient appropriate because:IV treatments appropriate due to intensity of illness or inability to take PO and Inpatient level of care appropriate due to severity of illness, high-grade bowel obstruction,   Dispo: The patient is from: Home              Anticipated d/c is to: Home              Anticipated d/c date is:               Patient currently is not medically stable to d/c.   Difficult to place patient No   Consultants:  General surgery  GI   Procedures:  NG tube placement.  Anti-infectives:  Marland Kitchen Ceftriaxone x1 . Ciprofloxacin metronidazole 2/1>  Anti-infectives (From admission, onward)   Start     Dose/Rate Route Frequency Ordered Stop   12/06/20 0600  ciprofloxacin (CIPRO) IVPB 400 mg        400 mg 200 mL/hr over 60  Minutes Intravenous Every 12 hours 12/05/20 1927     12/05/20 1700  ciprofloxacin (CIPRO) IVPB 200 mg  Status:  Discontinued        200 mg 100 mL/hr over 60 Minutes Intravenous Every 12 hours 12/05/20 1450 12/05/20 1927   12/05/20 1600  metroNIDAZOLE (FLAGYL) IVPB 500 mg        500 mg 100 mL/hr over 60 Minutes Intravenous Every 8 hours 12/05/20 1450     12/04/20 2045  cefTRIAXone (ROCEPHIN) 1 g in sodium chloride 0.9 %  100 mL IVPB        1 g 200 mL/hr over 30 Minutes Intravenous  Once 12/04/20 2036 12/04/20 2217     Subjective: Today, patient was seen and examined at bedside.  Feels much better today.  Has had a bowel movement passing flatus.  No nausea vomiting or abdominal pain.  Feels fullness in the ear but no pain   Objective: Vitals:   12/06/20 2124 12/07/20 0523  BP: (!) 147/99 (!) 153/94  Pulse: 75 77  Resp: 16 16  Temp: 98 F (36.7 C) 97.6 F (36.4 C)  SpO2: 100% 100%    Intake/Output Summary (Last 24 hours) at 12/07/2020 0942 Last data filed at 12/07/2020 0600 Gross per 24 hour  Intake 3573.86 ml  Output 601 ml  Net 2972.86 ml   Filed Weights   12/04/20 2004 12/05/20 0000  Weight: 63.8 kg 63.6 kg   Body mass index is 24.84 kg/m.   Physical Exam:  General:  Average built, not in obvious distress, NG tube+ HENT:   No scleral pallor or icterus noted. Oral mucosa is moist.  Chest:  Clear breath sounds.  Diminished breath sounds bilaterally. No crackles or wheezes.  CVS: S1 &S2 heard. No murmur.  Regular rate and rhythm. Abdomen: Soft, nontender, nondistended.  Bowel sounds are heard.   Extremities: No cyanosis, clubbing or edema.  Peripheral pulses are palpable. Psych: Alert, awake and oriented, normal mood CNS:  No cranial nerve deficits.  Power equal in all extremities.   Skin: Warm and dry.  No rashes noted.  Data Review: I have personally reviewed the following laboratory data and studies,  CBC: Recent Labs  Lab 12/04/20 1725 12/05/20 0323 12/06/20 0306 12/07/20 0324  WBC 17.6* 14.2* 10.3 9.6  HGB 16.8* 14.4 15.1* 13.6  HCT 51.5* 44.2 49.0* 42.1  MCV 88.5 89.8 94.0 90.3  PLT 624* 460* 386 263   Basic Metabolic Panel: Recent Labs  Lab 12/04/20 1725 12/05/20 0323 12/06/20 0306 12/07/20 0324  NA 134* 136 137 137  K 3.4* 3.3* 3.5 4.5  CL 95* 98 101 102  CO2 24 25 21* 24  GLUCOSE 144* 91 68* 115*  BUN 23 21 20 13   CREATININE 1.01* 0.82 0.74 0.66  CALCIUM  8.7* 7.9* 8.2* 8.4*  MG  --  1.8 2.2 1.7  PHOS  --   --  2.5 1.7*   Liver Function Tests: Recent Labs  Lab 12/04/20 1725 12/06/20 0306 12/07/20 0324  AST 30 20 20   ALT 24 18 18   ALKPHOS 90 74 56  BILITOT 0.9 0.6 0.5  PROT 8.1 6.7 5.9*  ALBUMIN 3.4* 2.9* 2.6*   Recent Labs  Lab 12/04/20 1725  LIPASE 20   No results for input(s): AMMONIA in the last 168 hours. Cardiac Enzymes: No results for input(s): CKTOTAL, CKMB, CKMBINDEX, TROPONINI in the last 168 hours. BNP (last 3 results) No results for input(s): BNP in the last 8760 hours.  ProBNP (last  3 results) No results for input(s): PROBNP in the last 8760 hours.  CBG: No results for input(s): GLUCAP in the last 168 hours. Recent Results (from the past 240 hour(s))  Novel Coronavirus, NAA ([facility])     Status: None   Collection Time: 11/27/20 11:00 AM   Specimen: Nasopharyngeal(NP) swabs in vial transport medium  Result Value Ref Range Status   SARS-CoV-2, NAA Not Detected Not Detected Final    Comment: This nucleic acid amplification test was developed and its performance characteristics determined by [employer]. Nucleic acid amplification tests include RT-PCR and TMA. This test has not been FDA cleared or approved. This test has been authorized by FDA under an Emergency Use Authorization (EUA). This test is only authorized for the duration of time the declaration that circumstances exist justifying the authorization of the emergency use of in vitro diagnostic tests for detection of SARS-CoV-2 virus and/or diagnosis of COVID-19 infection under section 564(b)(1) of the Act, 21 U.S.C. 562BWL-8(L) (1), unless the authorization is terminated or revoked sooner. When diagnostic testing is negative, the possibility of a false negative result should be considered in the context of a patient's recent exposures and the presence of clinical signs and symptoms consistent with COVID-19. An individual without symptoms of  COVID-19 and who is not shedding SARS-CoV-2 virus wo uld expect to have a negative (not detected) result in this assay.   SARS-COV-2, NAA 2 DAY TAT     Status: None   Collection Time: 11/27/20 11:00 AM  Result Value Ref Range Status   SARS-CoV-2, NAA 2 DAY TAT Performed  Final  Urine culture     Status: Abnormal   Collection Time: 12/04/20  6:14 PM   Specimen: Urine, Random  Result Value Ref Range Status   Specimen Description   Final    URINE, RANDOM Performed at [facility] [address], [place], [place] [address]    Special Requests   Final    NONE Performed at [facility], [place] [address], [place], [place] [address]    Culture >=100,000 COLONIES/mL ESCHERICHIA COLI (A)  Final   Report Status 12/07/2020 FINAL  Final   Organism ID, Bacteria ESCHERICHIA COLI (A)  Final      Susceptibility   Escherichia coli - MIC*    AMPICILLIN >=32 RESISTANT Resistant     CEFAZOLIN <=4 SENSITIVE Sensitive     CEFEPIME <=0.12 SENSITIVE Sensitive     CEFTRIAXONE <=0.25 SENSITIVE Sensitive     CIPROFLOXACIN <=0.25 SENSITIVE Sensitive     GENTAMICIN <=1 SENSITIVE Sensitive     IMIPENEM <=0.25 SENSITIVE Sensitive     NITROFURANTOIN <=16 SENSITIVE Sensitive     TRIMETH/SULFA <=20 SENSITIVE Sensitive     AMPICILLIN/SULBACTAM >=32 RESISTANT Resistant     PIP/TAZO <=4 SENSITIVE Sensitive     * >=100,000 COLONIES/mL ESCHERICHIA COLI  SARS CORONAVIRUS 2 (TAT 6-24 HRS) Nasopharyngeal Nasopharyngeal Swab     Status: None   Collection Time: 12/04/20  8:38 PM   Specimen: Nasopharyngeal Swab  Result Value Ref Range Status   SARS Coronavirus 2 NEGATIVE NEGATIVE Final    Comment: (NOTE) SARS-CoV-2 target nucleic acids are NOT DETECTED.  The SARS-CoV-2 RNA is generally detectable in upper and lower respiratory specimens during the acute phase of infection. Negative results do not preclude SARS-CoV-2 infection, do not rule out co-infections with other pathogens,  and should not be used as the sole basis for treatment or other patient management decisions. Negative results must be combined  with clinical observations, patient history, and epidemiological information. The expected result is Negative.  Fact Sheet for Patients: SugarRoll.be  Fact Sheet for Healthcare Providers: https://www.woods-mathews.com/  This test is not yet approved or cleared by the [place] FDA and  has been authorized for detection and/or diagnosis of SARS-CoV-2 by FDA under an Emergency Use Authorization (EUA). This EUA will remain  in effect (meaning this test can be used) for the duration of the COVID-19 declaration under Se ction 564(b)(1) of the Act, 21 U.S.C. section 360bbb-3(b)(1), unless the authorization is terminated or revoked sooner.  Performed at [facility], [place] [address] [place], [place] [address]      Studies: No results found.    Flora Lipps, MD  [facility] 12/07/2020  If 7PM-7AM, please contact night-coverage

## 2020-12-07 NOTE — Plan of Care (Signed)
  Problem: Education: Goal: Knowledge of General Education information will improve Description: Including pain rating scale, medication(s)/side effects and non-pharmacologic comfort measures Outcome: Progressing   Problem: Clinical Measurements: Goal: Diagnostic test results will improve Outcome: Progressing   Problem: Coping: Goal: Level of anxiety will decrease Outcome: Progressing   

## 2020-12-07 NOTE — Progress Notes (Signed)
Patient upset regarding our visitor policy. Patient had a visitor in our system who has already visited her but now wanting her daughter to come visit. Patient has a copy of our visitor policy and states she understands it, however states we "need to allow it anyway."  Informed her I have no control over the policy and they only make exceptions for extenuating circumstances, her doesn't fall into that category. Attempted to further explain but patient through her hand up and said "im done" and became agitated. Charge nurse informed her of policy as well.

## 2020-12-07 NOTE — Progress Notes (Addendum)
     [facility] Progress Note  CC:  Crohn's disease, SBO  Subjective:  Feeling better.  Passing flatus and several stool.  NGT pulled this AM by surgery and she has tolerated that well.  Tolerating clear liquids.   Objective:  Vital signs in last 24 hours: Temp:  [97.6 F (36.4 C)-98 F (36.7 C)] 97.6 F (36.4 C) (02/04 0523) Pulse Rate:  [75-88] 77 (02/04 0523) Resp:  [16-18] 16 (02/04 0523) BP: (147-153)/(94-99) 153/94 (02/04 0523) SpO2:  [100 %] 100 % (02/04 0523) Last BM Date: 12/06/20 General:  Alert, Well-developed, in NAD Heart:  Regular rate and rhythm; no murmurs Pulm:  CTAB.  No W/R/R. Abdomen:  Soft, non-distended.  BS present and hyperactive.  Non-tender. Extremities:  Without edema. Neurologic:  Alert and oriented x 4;  grossly normal neurologically. Psych:  Alert and cooperative. Normal mood and affect.  Intake/Output from previous day: 02/03 0701 - 02/04 0700 In: 3573.9 [P.O.:630; I.V.:2300; IV Piggyback:643.9] Out: 701 [Urine:100; Emesis/NG output:600; Stool:1]  Lab Results: Recent Labs    12/05/20 0323 12/06/20 0306 12/07/20 0324  WBC 14.2* 10.3 9.6  HGB 14.4 15.1* 13.6  HCT 44.2 49.0* 42.1  PLT 460* 386 306   BMET Recent Labs    12/05/20 0323 12/06/20 0306 12/07/20 0324  NA 136 137 137  K 3.3* 3.5 4.5  CL 98 101 102  CO2 25 21* 24  GLUCOSE 91 68* 115*  BUN 21 20 13   CREATININE 0.82 0.74 0.66  CALCIUM 7.9* 8.2* 8.4*   LFT Recent Labs    12/07/20 0324  PROT 5.9*  ALBUMIN 2.6*  AST 20  ALT 18  ALKPHOS 56  BILITOT 0.5   Assessment / Plan: 1.Crohn's ileitis:Currently on Stelara, nextdose2/14/2022, started with abdominal pain over the past 5 days which has been increasing in severity, now with no bowel movement in 3 days and limited amounts of flatus. Had been on prednisone 10 mgevery other dayat home as well. 2. SBO: High-grade distal SBO with transition in the pelvis. 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  Surgery following. Has history of the same in August 2021.  -Started IV solumedrol 20 mg IV BID on 2/2.  Continue for now.  Would discharge on prednisone 40 mg daily with decrease by 5 mg every week. -Started Cipro IV BID and Flagyl IV TID, should complete 10 days.   LOS: 3 days   Laban Emperor. Zehr  12/07/2020, 9:35 AM   Attending physician's note   I have taken an interval history, reviewed the chart and examined the patient. I agree with the Advanced Practitioner's note, impression and recommendations.   60yr old With Crohn's ileitis, Dx 08/2019, recently had induction dose of Stelara Adm d/t PSBO with possible enteroenteric fistula on IVF/steroids/antibiotics.  NG tube has been removed.  She is tolerating clear liquids well.  No abdominal pain.  Had diarrhea (expected)  Plan: -Advance to full liquid diet. -Continue IV Solu-Medrol 20 mg IV Q12hrs. Can transition to prednisone 40 mg p.o. QD tomorrow morning (with AM dose of Solu-Medrol) followed by slow taper. -Continue Cipro/metronidazole x 7 days. -Resume Stelara (next dose 2/14) -May have to add Imuran as outpatient. Nl TPMT -Ambulate/VTE prophylaxis -Appreciate surgery consultation. -Dr. Havery Moros taking over service tomorrow.    Carmell Austria, MD [facility] [phone]

## 2020-12-07 NOTE — Progress Notes (Signed)
[facility] Progress Note     Subjective: CC-  Feeling much better. She has tolerated NG tube being clamped x24 hours. Tolerating liquids. States that she has had about 5 loose/ semi-formed BMs since yesterday. Passing flatus.   Objective: Vital signs in last 24 hours: Temp:  [97.6 F (36.4 C)-98 F (36.7 C)] 97.6 F (36.4 C) (02/04 0523) Pulse Rate:  [75-88] 77 (02/04 0523) Resp:  [16-18] 16 (02/04 0523) BP: (147-153)/(94-99) 153/94 (02/04 0523) SpO2:  [100 %] 100 % (02/04 0523) Last BM Date: 12/06/20  Intake/Output from previous day: 02/03 0701 - 02/04 0700 In: 3573.9 [P.O.:630; I.V.:2300; IV Piggyback:643.9] Out: 701 [Urine:100; Emesis/NG output:600; Stool:1] Intake/Output this shift: No intake/output data recorded.  PE: Gen:  Alert, NAD, pleasant Pulm:  rate and effort normal Abd: Soft, NT/ND, +BS Psych: A&Ox4  Skin: no rashes noted, warm and dry  Lab Results:  Recent Labs    12/06/20 0306 12/07/20 0324  WBC 10.3 9.6  HGB 15.1* 13.6  HCT 49.0* 42.1  PLT 386 306   BMET Recent Labs    12/06/20 0306 12/07/20 0324  NA 137 137  K 3.5 4.5  CL 101 102  CO2 21* 24  GLUCOSE 68* 115*  BUN 20 13  CREATININE 0.74 0.66  CALCIUM 8.2* 8.4*   PT/INR No results for input(s): LABPROT, INR in the last 72 hours. CMP     Component Value Date/Time   NA 137 12/07/2020 0324   NA 142 08/03/2020 1315   K 4.5 12/07/2020 0324   CL 102 12/07/2020 0324   CO2 24 12/07/2020 0324   GLUCOSE 115 (H) 12/07/2020 0324   BUN 13 12/07/2020 0324   BUN 17 08/03/2020 1315   CREATININE 0.66 12/07/2020 0324   CREATININE 0.94 05/08/2016 0741   CALCIUM 8.4 (L) 12/07/2020 0324   PROT 5.9 (L) 12/07/2020 0324   PROT 6.6 08/03/2020 1315   ALBUMIN 2.6 (L) 12/07/2020 0324   ALBUMIN 3.7 (L) 08/03/2020 1315   AST 20 12/07/2020 0324   ALT 18 12/07/2020 0324   ALKPHOS 56 12/07/2020 0324   BILITOT 0.5 12/07/2020 0324   BILITOT <0.2 08/03/2020 1315   GFRNONAA >60 12/07/2020  0324   GFRAA 85 08/03/2020 1315   Lipase     Component Value Date/Time   LIPASE 20 12/04/2020 1725       Studies/Results: No results found.  Anti-infectives: Anti-infectives (From admission, onward)   Start     Dose/Rate Route Frequency Ordered Stop   12/06/20 0600  ciprofloxacin (CIPRO) IVPB 400 mg        400 mg 200 mL/hr over 60 Minutes Intravenous Every 12 hours 12/05/20 1927     12/05/20 1700  ciprofloxacin (CIPRO) IVPB 200 mg  Status:  Discontinued        200 mg 100 mL/hr over 60 Minutes Intravenous Every 12 hours 12/05/20 1450 12/05/20 1927   12/05/20 1600  metroNIDAZOLE (FLAGYL) IVPB 500 mg        500 mg 100 mL/hr over 60 Minutes Intravenous Every 8 hours 12/05/20 1450     12/04/20 2045  cefTRIAXone (ROCEPHIN) 1 g in sodium chloride 0.9 % 100 mL IVPB        1 g 200 mL/hr over 30 Minutes Intravenous  Once 12/04/20 2036 12/04/20 2217       Assessment/Plan HTN Depression/anxiety GERD Tobacco abuse E coli UTI - treated with one dose of Rocephin  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 Will d/c NG tube and allow clear liquid diet. Add Boost breeze. Continue mobilizing. IV steroids and IV abx per GI. -would like to avoid surgery if she resolves, but if not given severe narrowing, may need surgery.  FEN: CLD, IVF, Boost breeze ID: Rocephin 2/1 x 1 dose (UTI), C/F per GI recs VTE: SCD's, Lovenox Foley: none   LOS: 3 days    Wellington Hampshire, [facility] 12/07/2020, 8:47 AM Please see Amion for pager number during day hours 7:00am-4:30pm

## 2020-12-08 LAB — COMPREHENSIVE METABOLIC PANEL
ALT: 17 U/L (ref 0–44)
AST: 18 U/L (ref 15–41)
Albumin: 2.8 g/dL — ABNORMAL LOW (ref 3.5–5.0)
Alkaline Phosphatase: 53 U/L (ref 38–126)
Anion gap: 7 (ref 5–15)
BUN: 7 mg/dL — ABNORMAL LOW (ref 8–23)
CO2: 21 mmol/L — ABNORMAL LOW (ref 22–32)
Calcium: 8.3 mg/dL — ABNORMAL LOW (ref 8.9–10.3)
Chloride: 103 mmol/L (ref 98–111)
Creatinine, Ser: 0.69 mg/dL (ref 0.44–1.00)
GFR, Estimated: >60 mL/min (ref >60)
Glucose, Bld: 110 mg/dL — ABNORMAL HIGH (ref 70–99)
Potassium: 4.1 mmol/L (ref 3.5–5.1)
Sodium: 131 mmol/L — ABNORMAL LOW (ref 135–145)
Total Bilirubin: 0.4 mg/dL (ref 0.3–1.2)
Total Protein: 6.2 g/dL — ABNORMAL LOW (ref 6.5–8.1)

## 2020-12-08 LAB — MAGNESIUM
Magnesium: 1.6 mg/dL — ABNORMAL LOW (ref 1.7–2.4)
Magnesium: 1.4 mg/dL — ABNORMAL LOW (ref 1.7–2.4)

## 2020-12-08 LAB — CBC
HCT: 44.3 % (ref 36.0–46.0)
Hemoglobin: 14.2 g/dL (ref 12.0–15.0)
MCH: 29.1 pg (ref 26.0–34.0)
MCHC: 32.1 g/dL (ref 30.0–36.0)
MCV: 90.8 fL (ref 80.0–100.0)
Platelets: 381 10*3/uL (ref 150–400)
RBC: 4.88 MIL/uL (ref 3.87–5.11)
RDW: 12.9 % (ref 11.5–15.5)
WBC: 9.3 10*3/uL (ref 4.0–10.5)
nRBC: 0 % (ref 0.0–0.2)

## 2020-12-08 LAB — PHOSPHORUS: Phosphorus: 2.2 mg/dL — ABNORMAL LOW (ref 2.5–4.6)

## 2020-12-08 MED ORDER — CIPROFLOXACIN HCL 500 MG PO TABS
500.0000 mg | ORAL_TABLET | Freq: Two times a day (BID) | ORAL | Status: DC
  Administered 2020-12-08 – 2020-12-09 (×2): 500 mg via ORAL
  Filled 2020-12-08 (×2): qty 1

## 2020-12-08 MED ORDER — MAGNESIUM OXIDE 400 (241.3 MG) MG PO TABS
800.0000 mg | ORAL_TABLET | Freq: Two times a day (BID) | ORAL | Status: DC
  Administered 2020-12-09: 800 mg via ORAL
  Filled 2020-12-08: qty 2

## 2020-12-08 MED ORDER — PANTOPRAZOLE SODIUM 40 MG PO TBEC
40.0000 mg | DELAYED_RELEASE_TABLET | Freq: Every day | ORAL | Status: DC
  Administered 2020-12-09: 40 mg via ORAL
  Filled 2020-12-08: qty 1

## 2020-12-08 MED ORDER — PREDNISONE 20 MG PO TABS
40.0000 mg | ORAL_TABLET | Freq: Every day | ORAL | Status: DC
  Administered 2020-12-08 – 2020-12-09 (×2): 40 mg via ORAL
  Filled 2020-12-08 (×2): qty 2

## 2020-12-08 MED ORDER — POTASSIUM & SODIUM PHOSPHATES 280-160-250 MG PO PACK
1.0000 | PACK | Freq: Two times a day (BID) | ORAL | Status: AC
  Administered 2020-12-08 (×2): 1 via ORAL
  Filled 2020-12-08 (×2): qty 1

## 2020-12-08 MED ORDER — METRONIDAZOLE 500 MG PO TABS
500.0000 mg | ORAL_TABLET | Freq: Three times a day (TID) | ORAL | Status: DC
  Administered 2020-12-08 – 2020-12-09 (×3): 500 mg via ORAL
  Filled 2020-12-08 (×3): qty 1

## 2020-12-08 NOTE — Progress Notes (Signed)
Subjective: Continued improvement.  Bowel movements normalizing. No nausea with NG out  ROS: See above, otherwise other systems negative  Objective: Vital signs in last 24 hours: Temp:  [97.6 F (36.4 C)] 97.6 F (36.4 C) (02/05 0525) Pulse Rate:  [62-86] 86 (02/05 0525) Resp:  [16] 16 (02/05 0525) BP: (135-146)/(85-91) 135/91 (02/05 0525) SpO2:  [99 %-100 %] 100 % (02/05 0525) Last BM Date: 12/07/20  Intake/Output from previous day: 02/04 0701 - 02/05 0700 In: 3544.3 [P.O.:120; I.V.:2227; IV Piggyback:1197.3] Out: 900 [Urine:900] Intake/Output this shift: No intake/output data recorded.  PE: Gen: NAD Heart: regular Lungs: CTAB Abd: soft, NT, ND, +BS, NG removed  Lab Results:  Recent Labs    12/07/20 0324 12/08/20 0307  WBC 9.6 9.3  HGB 13.6 14.2  HCT 42.1 44.3  PLT 306 381   BMET Recent Labs    12/07/20 0324 12/08/20 0307  NA 137 131*  K 4.5 4.1  CL 102 103  CO2 24 21*  GLUCOSE 115* 110*  BUN 13 7*  CREATININE 0.66 0.69  CALCIUM 8.4* 8.3*   PT/INR No results for input(s): LABPROT, INR in the last 72 hours. CMP     Component Value Date/Time   NA 131 (L) 12/08/2020 0307   NA 142 08/03/2020 1315   K 4.1 12/08/2020 0307   CL 103 12/08/2020 0307   CO2 21 (L) 12/08/2020 0307   GLUCOSE 110 (H) 12/08/2020 0307   BUN 7 (L) 12/08/2020 0307   BUN 17 08/03/2020 1315   CREATININE 0.69 12/08/2020 0307   CREATININE 0.94 05/08/2016 0741   CALCIUM 8.3 (L) 12/08/2020 0307   PROT 6.2 (L) 12/08/2020 0307   PROT 6.6 08/03/2020 1315   ALBUMIN 2.8 (L) 12/08/2020 0307   ALBUMIN 3.7 (L) 08/03/2020 1315   AST 18 12/08/2020 0307   ALT 17 12/08/2020 0307   ALKPHOS 53 12/08/2020 0307   BILITOT 0.4 12/08/2020 0307   BILITOT <0.2 08/03/2020 1315   GFRNONAA >60 12/08/2020 0307   GFRAA 85 08/03/2020 1315   Lipase     Component Value Date/Time   LIPASE 20 12/04/2020 1725       Studies/Results: No results found.  Anti-infectives: Anti-infectives  (From admission, onward)   Start     Dose/Rate Route Frequency Ordered Stop   12/06/20 0600  ciprofloxacin (CIPRO) IVPB 400 mg        400 mg 200 mL/hr over 60 Minutes Intravenous Every 12 hours 12/05/20 1927     12/05/20 1700  ciprofloxacin (CIPRO) IVPB 200 mg  Status:  Discontinued        200 mg 100 mL/hr over 60 Minutes Intravenous Every 12 hours 12/05/20 1450 12/05/20 1927   12/05/20 1600  metroNIDAZOLE (FLAGYL) IVPB 500 mg        500 mg 100 mL/hr over 60 Minutes Intravenous Every 8 hours 12/05/20 1450     12/04/20 2045  cefTRIAXone (ROCEPHIN) 1 g in sodium chloride 0.9 % 100 mL IVPB        1 g 200 mL/hr over 30 Minutes Intravenous  Once 12/04/20 2036 12/04/20 2217       Assessment/Plan HTN Depression/anxiety GERD Tobacco abuse  UTI - UA w/ leuks, nitrites, bacteria, culture pending; was treated with one dose of Rocephin  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    LOS: 4 days   Felicie Morn, [facility] 12/08/2020, 8:21 AM Please see Amion for pager number during day hours 7:00am-4:30pm or 7:00am -11:30am on weekends

## 2020-12-08 NOTE — Plan of Care (Signed)

## 2020-12-08 NOTE — Progress Notes (Signed)
      Progress Note   Subjective  Patient doing well this AM. She denies any pain at all. No vomiting, tolerating liquid diet. She has passed some regular stools. Feels back to baseline. Wants to go home tomorrow if possible.   Objective   Vital signs in last 24 hours: Temp:  [97.6 F (36.4 C)] 97.6 F (36.4 C) (02/05 0525) Pulse Rate:  [62-86] 86 (02/05 0525) Resp:  [16] 16 (02/05 0525) BP: (135-146)/(85-91) 135/91 (02/05 0525) SpO2:  [99 %-100 %] 100 % (02/05 0525) Last BM Date: 12/07/20 General:    white female in NAD Heart:  Regular rate and rhythm; no murmurs Lungs: Respirations even and unlabored, lungs CTA bilaterally Abdomen:  Soft, nontender and nondistended.  Extremities:  Without edema. Neurologic:  Alert and oriented,  grossly normal neurologically. Psych:  Cooperative. Normal mood and affect.  Intake/Output from previous day: 02/04 0701 - 02/05 0700 In: 3544.3 [P.O.:120; I.V.:2227; IV Piggyback:1197.3] Out: 900 [Urine:900] Intake/Output this shift: No intake/output data recorded.  Lab Results: Recent Labs    12/06/20 0306 12/07/20 0324 12/08/20 0307  WBC 10.3 9.6 9.3  HGB 15.1* 13.6 14.2  HCT 49.0* 42.1 44.3  PLT 386 306 381   BMET Recent Labs    12/06/20 0306 12/07/20 0324 12/08/20 0307  NA 137 137 131*  K 3.5 4.5 4.1  CL 101 102 103  CO2 21* 24 21*  GLUCOSE 68* 115* 110*  BUN 20 13 7*  CREATININE 0.74 0.66 0.69  CALCIUM 8.2* 8.4* 8.3*   LFT Recent Labs    12/08/20 0307  PROT 6.2*  ALBUMIN 2.8*  AST 18  ALT 17  ALKPHOS 53  BILITOT 0.4   PT/INR No results for input(s): LABPROT, INR in the last 72 hours.  Studies/Results: No results found.     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 Recently started Stelara as outpatient. She has done really well with IV steroids, antibiotics, and bowel rest. NG removed, tolerating full liquids and having  regular bowel movements. Day #4 IV steroids today (given dose of solumedrol overnight).  Plan: - okay to advance to soft diet - stop IV solumedrol today - start prednisone 39m today. Will plan for prolonged taper as outpatient. 458m/ day for 2 weeks followed by slow taper of 80m1080m week until done - continue Cipro / flagyl for total of 7 days - she will continue Stelara on 2/14 (this event is not considered failure of Stelara, she just recently started it) - continue DVT prophylaxis - if patient does well today can discharge home tomorrow on regimen as outlined. Our office will coordinate outpatient follow up with her. If any questions in the interim, please contact us,[facility] will sign off for now.  SteCarolina CellarD [facility]

## 2020-12-08 NOTE — Progress Notes (Signed)
PROGRESS NOTE  Cynthia [name] TIW:[id number] DOB: 29-Oct-1961 DOA: 12/04/2020 PCP: Denita Lung, MD  HPI/Recap of past 24 hours: SNK:[id number] S Stobaughis a 60 y.o.femalewith medical history significant forfistulizing Crohn's ileocolitis, hypertension, and depression/anxiety who presented to the ED for evaluation of abdominal pain, nausea, and vomiting for 1-1/2-week with decreased oral intake for 3 to 4 days.  Patient was following up with GI, Dr. Fuller Plan and has been on Stelara.   Patient was seen in GI clinic and due to concern for bowel obstruction she was sent to the ED for further evaluation and management.  In the ED vitals were stable.  WBC was elevated at 17.6 with mild thrombocytosis.  There was mild hypokalemia with potassium of 3.4.  Lipase was within normal limits including LFTs. Urinalysis shows positive nitrites, large leukocytes, 6-10 RBC/hpf, 21-50 WBC/hpf, many bacteria microscopy. CT abdomen/pelvis with contrast shows high-grade distal small bowel obstruction with transition within the pelvis secondary to strictures and adhesions. Patient was given 1 L normal saline, IV ceftriaxone, IV Zofran, IV morphine.   General surgery was consulted who recommended NG tube placement.  Patient was then admitted to the hospital.  Covid swab was negative.  Subjective: December 08, 2018 2010: Patient seen and examined at bedside patient stated she is doing much better her NG tube was removed yesterday December 08, 2020 she was started on clear liquids she says she is tolerating the clear liquids well denies any abdominal pain she has made good bowel movement.   Assessment/Plan: Principal Problem:   Small bowel obstruction (HCC) Active Problems:   Hypertension   Crohn's disease with complication (HCC)   Hypokalemia  1.  High-grade distal small bowel obstruction with history of ileocolitis and distal ileal stricture.  General surgery has signed off.  Her NG tube has been removed and she  has been started on liquid which will be advanced as tolerated  2.  Hypertension continue current antihypertensive  Asymptomatic bacteriuria her urine culture shows E. coli she is being treated with Cipro for her bowel obstruction  Code Status: Full  Severity of Illness: The appropriate patient status for this patient is INPATIENT. Inpatient status is judged to be reasonable and necessary in order to provide the required intensity of service to ensure the patient's safety. The patient's presenting symptoms, physical exam findings, and initial radiographic and laboratory data in the context of their chronic comorbidities is felt to place them at high risk for further clinical deterioration. Furthermore, it is not anticipated that the patient will be medically stable for discharge from the hospital within 2 midnights of admission. The following factors support the patient status of inpatient.   " Patient continues to require IV antibiotics she has just been started on her full liquid diet which will be transitioned to p.o. patient had high-grade bowel obstruction.  * I certify that at the point of admission it is my clinical judgment that the patient will require inpatient hospital care spanning beyond 2 midnights from the point of admission due to high intensity of service, high risk for further deterioration and high frequency of surveillance required.*    Family Communication: Patient  Disposition Plan: Possible home tomorrow   Consultants:  Surgery  GI  Procedures:  NG tube placement  Antimicrobials:  Ceftriaxone  Cipro  Metronidazole  DVT prophylaxis: Lovenox   Objective: Vitals:   12/07/20 0523 12/07/20 1337 12/07/20 2121 12/08/20 0525  BP: (!) 153/94 (!) 146/85 (!) 142/90 (!) 135/91  Pulse: 77 62  85 86  Resp: 16 16 16 16   Temp: 97.6 F (36.4 C) 97.6 F (36.4 C) 97.6 F (36.4 C) 97.6 F (36.4 C)  TempSrc: Oral Oral Oral Oral  SpO2: 100% 100% 99% 100%   Weight:      Height:        Intake/Output Summary (Last 24 hours) at 12/08/2020 1355 Last data filed at 12/08/2020 1315 Gross per 24 hour  Intake 3633.6 ml  Output 750 ml  Net 2883.6 ml   Filed Weights   12/04/20 2004 12/05/20 0000  Weight: 63.8 kg 63.6 kg   Body mass index is 24.84 kg/m.  Exam:  . General: 59 y.o. year-old female well developed well nourished in no acute distress.  Alert and oriented x3. . Cardiovascular: Regular rate and rhythm with no rubs or gallops.  No thyromegaly or JVD noted.   Marland Kitchen Respiratory: Clear to auscultation with no wheezes or rales. Good inspiratory effort. . Abdomen: Soft nontender nondistended with normal bowel sounds x4 quadrants. . Musculoskeletal: No lower extremity edema. 2/4 pulses in all 4 extremities. . Skin: No ulcerative lesions noted or rashes, . Psychiatry: Mood is appropriate for condition and setting    Data Reviewed: CBC: Recent Labs  Lab 12/04/20 1725 12/05/20 0323 12/06/20 0306 12/07/20 0324 12/08/20 0307  WBC 17.6* 14.2* 10.3 9.6 9.3  HGB 16.8* 14.4 15.1* 13.6 14.2  HCT 51.5* 44.2 49.0* 42.1 44.3  MCV 88.5 89.8 94.0 90.3 90.8  PLT 624* 460* 386 306 [id number]   Basic Metabolic Panel: Recent Labs  Lab 12/04/20 1725 12/05/20 0323 12/06/20 0306 12/07/20 0324 12/08/20 0307  NA 134* 136 137 137 131*  K 3.4* 3.3* 3.5 4.5 4.1  CL 95* 98 101 102 103  CO2 24 25 21* 24 21*  GLUCOSE 144* 91 68* 115* 110*  BUN 23 21 20 13  7*  CREATININE 1.01* 0.82 0.74 0.66 0.69  CALCIUM 8.7* 7.9* 8.2* 8.4* 8.3*  MG  --  1.8 2.2 1.7 1.6*  PHOS  --   --  2.5 1.7* 2.2*   GFR: Estimated Creatinine Clearance: 66.3 mL/min (by C-G formula based on SCr of 0.69 mg/dL). Liver Function Tests: Recent Labs  Lab 12/04/20 1725 12/06/20 0306 12/07/20 0324 12/08/20 0307  AST 30 20 20 18   ALT 24 18 18 17   ALKPHOS 90 74 56 53  BILITOT 0.9 0.6 0.5 0.4  PROT 8.1 6.7 5.9* 6.2*  ALBUMIN 3.4* 2.9* 2.6* 2.8*   Recent Labs  Lab 12/04/20 1725   LIPASE 20   No results for input(s): AMMONIA in the last 168 hours. Coagulation Profile: No results for input(s): INR, PROTIME in the last 168 hours. Cardiac Enzymes: No results for input(s): CKTOTAL, CKMB, CKMBINDEX, TROPONINI in the last 168 hours. BNP (last 3 results) No results for input(s): PROBNP in the last 8760 hours. HbA1C: No results for input(s): HGBA1C in the last 72 hours. CBG: No results for input(s): GLUCAP in the last 168 hours. Lipid Profile: No results for input(s): CHOL, HDL, LDLCALC, TRIG, CHOLHDL, LDLDIRECT in the last 72 hours. Thyroid Function Tests: No results for input(s): TSH, T4TOTAL, FREET4, T3FREE, THYROIDAB in the last 72 hours. Anemia Panel: No results for input(s): VITAMINB12, FOLATE, FERRITIN, TIBC, IRON, RETICCTPCT in the last 72 hours. Urine analysis:    Component Value Date/Time   COLORURINE AMBER (A) 12/04/2020 1727   APPEARANCEUR CLOUDY (A) 12/04/2020 1727   LABSPEC 1.031 (H) 12/04/2020 1727   LABSPEC 1.025 05/13/2018 1018   PHURINE 5.0 12/04/2020 1727  GLUCOSEU NEGATIVE 12/04/2020 1727   HGBUR NEGATIVE 12/04/2020 1727   BILIRUBINUR NEGATIVE 12/04/2020 1727   BILIRUBINUR negative 05/13/2018 1018   BILIRUBINUR n 12/04/2011 1014   KETONESUR 5 (A) 12/04/2020 1727   PROTEINUR 30 (A) 12/04/2020 1727   UROBILINOGEN negative 12/04/2011 1014   NITRITE POSITIVE (A) 12/04/2020 1727   LEUKOCYTESUR LARGE (A) 12/04/2020 1727   Sepsis Labs: @LABRCNTIP (procalcitonin:4,lacticidven:4)  ) Recent Results (from the past 240 hour(s))  Urine culture     Status: Abnormal   Collection Time: 12/04/20  6:14 PM   Specimen: Urine, Random  Result Value Ref Range Status   Specimen Description   Final    URINE, RANDOM Performed at [facility], [place] [address], [place], [place] [address]    Special Requests   Final    NONE Performed at [facility], [place] [address], [place], [place] [address]    Culture >=100,000  COLONIES/mL ESCHERICHIA COLI (A)  Final   Report Status 12/07/2020 FINAL  Final   Organism ID, Bacteria ESCHERICHIA COLI (A)  Final      Susceptibility   Escherichia coli - MIC*    AMPICILLIN >=32 RESISTANT Resistant     CEFAZOLIN <=4 SENSITIVE Sensitive     CEFEPIME <=0.12 SENSITIVE Sensitive     CEFTRIAXONE <=0.25 SENSITIVE Sensitive     CIPROFLOXACIN <=0.25 SENSITIVE Sensitive     GENTAMICIN <=1 SENSITIVE Sensitive     IMIPENEM <=0.25 SENSITIVE Sensitive     NITROFURANTOIN <=16 SENSITIVE Sensitive     TRIMETH/SULFA <=20 SENSITIVE Sensitive     AMPICILLIN/SULBACTAM >=32 RESISTANT Resistant     PIP/TAZO <=4 SENSITIVE Sensitive     * >=100,000 COLONIES/mL ESCHERICHIA COLI  SARS CORONAVIRUS 2 (TAT 6-24 HRS) Nasopharyngeal Nasopharyngeal Swab     Status: None   Collection Time: 12/04/20  8:38 PM   Specimen: Nasopharyngeal Swab  Result Value Ref Range Status   SARS Coronavirus 2 NEGATIVE NEGATIVE Final    Comment: (NOTE) SARS-CoV-2 target nucleic acids are NOT DETECTED.  The SARS-CoV-2 RNA is generally detectable in upper and lower respiratory specimens during the acute phase of infection. Negative results do not preclude SARS-CoV-2 infection, do not rule out co-infections with other pathogens, and should not be used as the sole basis for treatment or other patient management decisions. Negative results must be combined with clinical observations, patient history, and epidemiological information. The expected result is Negative.  Fact Sheet for Patients: SugarRoll.be  Fact Sheet for Healthcare Providers: https://www.woods-mathews.com/  This test is not yet approved or cleared by the [place] FDA and  has been authorized for detection and/or diagnosis of SARS-CoV-2 by FDA under an Emergency Use Authorization (EUA). This EUA will remain  in effect (meaning this test can be used) for the duration of the COVID-19 declaration under Se  ction 564(b)(1) of the Act, 21 U.S.C. section 360bbb-3(b)(1), unless the authorization is terminated or revoked sooner.  Performed at [facility], [place] [address], [place], [place] [address]       Studies: No results found.  Scheduled Meds: . ciprofloxacin  500 mg Oral BID  . enoxaparin (LOVENOX) injection  40 mg Subcutaneous Q24H  . feeding supplement  1 Container Oral TID BM  . losartan  100 mg Oral Daily   And  . hydrochlorothiazide  12.5 mg Oral Daily  . [START ON 12/09/2020] magnesium oxide  800 mg Oral BID  . metroNIDAZOLE  500 mg Oral Q8H  . [START ON 12/09/2020] pantoprazole  40 mg Oral Daily  . potassium & sodium phosphates  1 packet Oral BID  . pramipexole  1 mg Oral Daily  . predniSONE  40 mg Oral Daily  . venlafaxine XR  300 mg Oral Q breakfast    Continuous Infusions:   LOS: 4 days     Cristal Deer, MD [facility]  To reach me or the doctor on call, go to: [email or web address] Password [email or web address]  12/08/2020, 1:55 PM

## 2020-12-09 MED ORDER — METRONIDAZOLE 500 MG PO TABS: 500.0000 mg | ORAL_TABLET | Freq: Three times a day (TID) | ORAL | 0 refills | Status: DC

## 2020-12-09 MED ORDER — MAGNESIUM OXIDE 400 (241.3 MG) MG PO TABS: 400.0000 mg | ORAL_TABLET | Freq: Two times a day (BID) | ORAL | 0 refills | Status: DC

## 2020-12-09 MED ORDER — CIPROFLOXACIN HCL 500 MG PO TABS: 500.0000 mg | ORAL_TABLET | Freq: Two times a day (BID) | ORAL | 0 refills | Status: DC

## 2020-12-09 MED ORDER — PREDNISONE 20 MG PO TABS: 40.0000 mg | ORAL_TABLET | Freq: Every day | ORAL | 0 refills | Status: DC

## 2020-12-09 MED ORDER — PHENYLEPHRINE HCL 1 % NA SOLN: 1.0000 [drp] | Freq: Four times a day (QID) | NASAL | 0 refills | Status: DC | PRN

## 2020-12-09 NOTE — Discharge Summary (Signed)
Discharge Summary  Cynthia [name] [id number] DOB: [birth year]  PCP: Denita Lung, MD  Admit date: 12/04/2020 Discharge date: 12/09/2020  Time spent: 30 minutes  Recommendations for Outpatient Follow-up:  1. PCP follow-up  Discharge Diagnoses:  Active Hospital Problems   Diagnosis Date Noted  . Small bowel obstruction ([place]) 12/04/2020  . Hypokalemia 12/04/2020  . Crohn's disease with complication ([place]) [id number]  . Hypertension 09/19/2011    Resolved Hospital Problems  No resolved problems to display.    Discharge Condition: Improved  Diet recommendation: Soft diet and advance  Vitals:   12/08/20 2058 12/09/20 0510  BP: 127/87 (!) 151/90  Pulse: 75 75  Resp: 16 16  Temp: 98 F (36.7 C) 98.3 F (36.8 C)  SpO2: 100% 100%    History of present illness:  **[id number] S Stobaughis a 60 y.o.femalewith medical history significant forfistulizing Crohn's ileocolitis, hypertension, and depression/anxiety who presented to the ED for evaluation of abdominal pain, nausea, and vomiting for 1-1/2-week with decreased oral intake for 3 to 4 days. Patient was following up with GI, Dr. Fuller Plan and has been on Stelara. Patient was seen in GI clinic and due to concern for bowel obstruction she was sent to the ED for further evaluation and management. In the ED vitals were stable. WBC was elevated at 17.6 with mild thrombocytosis. There was mild hypokalemia with potassium of 3.4. Lipase was within normal limits including LFTs. Urinalysis shows positive nitrites, large leukocytes, 6-10 RBC/hpf, 21-50 WBC/hpf, many bacteria microscopy. CT abdomen/pelvis with contrast shows high-grade distal small bowel obstruction with transition within the pelvis secondary to strictures and adhesions. Patient was given 1 L normal saline, IV ceftriaxone, IV Zofran, IV morphine. General surgery was consulted who recommended NG tube placement. Patient was then admitted to the hospital. Covid swab  was negative. [facility] Course:  Principal Problem:   Small bowel obstruction ([place]) Active Problems:   Hypertension   Crohn's disease with complication (H[facility])   Hypokalemia 1.Patient was admitted with  High-grade distal small bowel obstruction with history of ileocolitis and distal ileal stricture.  General surgery has signed off.  Her NG tube has been removed and she has been started on liquid which was advanced as tolerated   2.  Hypertension continue current antihypertensive  Asymptomatic bacteriuria her urine culture shows E. coli she is being treated with Cipro for her bowel obstruction Patient was admitted with NG tube was removed on February 4 and her diet advanced she has tolerated her diet well she has been moving around with no problem she has benefited maximally from this hospital stay and is ready to be discharged  Procedures:  NG tube  Consultations:  GI  Surgery  Discharge Exam: BP (!) 151/90 (BP Location: Left Arm)   Pulse 75   Temp 98.3 F (36.8 C)   Resp 16   Ht 5\' 3"  (1.6 m)   Wt 63.6 kg   LMP 06/10/2011 (Exact Date)   SpO2 100%   BMI 24.84 kg/m   General: Alert oriented x3 no distress Cardiovascular: Regular rate and rhythm Respiratory: CTA bilaterally Abdomen soft nontender normal active bowel sounds  Discharge Instructions You were cared for by a hospitalist during your hospital stay. If you have any questions about your discharge medications or the care you received while you were in the hospital after you are discharged, you can call the unit and asked to speak with the hospitalist on call if the hospitalist that took care of you is  not available. Once you are discharged, your primary care physician will handle any further medical issues. Please note that NO REFILLS for any discharge medications will be authorized once you are discharged, as it is imperative that you return to your primary care physician (or establish a relationship with a  primary care physician if you do not have one) for your aftercare needs so that they can reassess your need for medications and monitor your lab values.  Discharge Instructions    Call MD for:  persistant nausea and vomiting   Complete by: As directed    Call MD for:  severe uncontrolled pain   Complete by: As directed    Call MD for:  temperature >100.4   Complete by: As directed    Diet - low sodium heart healthy   Complete by: As directed    Discharge instructions   Complete by: As directed    PER GI: start prednisone 40mg  today. Will plan for prolonged taper as outpatient. 40mg  / day for 2 weeks followed by slow taper of 5mg  / week until done - continue Cipro / flagyl for total of 7 days - she will continue Stelara on 2/14 (this event is not considered failure of Stelara, she just recently started it)  - Our office will coordinate outpatient follow up with her.   Increase activity slowly   Complete by: As directed      Allergies as of 12/09/2020      Reactions   Iodine    REACTION: in eye gtts Iodine eye drops; pt has no reactions to CT contrast      Medication List    TAKE these medications   ciprofloxacin 500 MG tablet Commonly known as: CIPRO Take 1 tablet (500 mg total) by mouth 2 (two) times daily.   esomeprazole 40 MG capsule Commonly known as: NEXIUM TAKE ONE (1) CAPSULE BY MOUTH 2 TIMES DAILY What changed:   how much to take  how to take this  when to take this  additional instructions   hyoscyamine 0.125 MG tablet Commonly known as: LEVSIN Take 0.125 mg by mouth every 4 (four) hours as needed for cramping.   losartan-hydrochlorothiazide 100-12.5 MG tablet Commonly known as: HYZAAR Take 1 tablet by mouth daily.   magnesium oxide 400 (241.3 Mg) MG tablet Commonly known as: MAG-OX Take 1 tablet (400 mg total) by mouth 2 (two) times daily.   metroNIDAZOLE 500 MG tablet Commonly known as: FLAGYL Take 1 tablet (500 mg total) by mouth 3 (three) times  daily.   ondansetron 4 MG disintegrating tablet Commonly known as: ZOFRAN-ODT Take 4 mg by mouth every 8 (eight) hours as needed for nausea or vomiting.   phenylephrine 1 % nasal spray Commonly known as: NEO-SYNEPHRINE Place 1 drop into both nostrils every 6 (six) hours as needed for congestion.   pramipexole 1 MG tablet Commonly known as: MIRAPEX Take 1 tablet by mouth daily What changed:   how much to take  how to take this  when to take this  additional instructions   predniSONE 20 MG tablet Commonly known as: DELTASONE Take 2 tablets (40 mg total) by mouth daily. Start taking on: December 10, 2020 What changed:   medication strength  how much to take   venlafaxine XR 150 MG 24 hr capsule Commonly known as: EFFEXOR-XR TAKE 2 CAPSULES BY MOUTH EVERY DAY What changed:   how much to take  how to take this  when to take this  additional  instructions      Allergies  Allergen Reactions  . Iodine     REACTION: in eye gtts Iodine eye drops; pt has no reactions to CT contrast      The results of significant diagnostics from this hospitalization (including imaging, microbiology, ancillary and laboratory) are listed below for reference.    Significant Diagnostic Studies: DG Abdomen 1 View  Result Date: 12/04/2020 CLINICAL DATA:  Check gastric catheter placement EXAM: ABDOMEN - 1 VIEW COMPARISON:  CT from earlier in the same day. FINDINGS: Gastric catheter is noted with the tip at the gastroesophageal junction. Proximal side port lies in the distal esophagus. This should be advanced several cm deeper into the stomach. Persistent small bowel dilatation is noted consistent with that seen on recent CT examination. IMPRESSION: Gastric catheter as described which should be advanced several cm deeper into the stomach. Persistent small bowel obstruction. Electronically Signed   By: Inez Catalina M.D.   On: 12/04/2020 23:19   CT ABDOMEN PELVIS W CONTRAST  Result Date:  12/04/2020 CLINICAL DATA:  60 year old female with abdominal pain. EXAM: CT ABDOMEN AND PELVIS WITH CONTRAST TECHNIQUE: Multidetector CT imaging of the abdomen and pelvis was performed using the standard protocol following bolus administration of intravenous contrast. CONTRAST:  192mL OMNIPAQUE IOHEXOL 300 MG/ML  SOLN COMPARISON:  CT abdomen pelvis dated 06/27/2020. FINDINGS: Lower chest: The visualized lung bases are clear. No intra-abdominal free air.  Small free fluid in the pelvis. Hepatobiliary: The liver is unremarkable. No intrahepatic biliary dilatation. The gallbladder is mildly distended. No calcified gallstone or pericholecystic fluid. Pancreas: Unremarkable. No pancreatic ductal dilatation or surrounding inflammatory changes. Spleen: Normal in size without focal abnormality. Adrenals/Urinary Tract: The adrenal glands unremarkable. There is no hydronephrosis on either side. There is symmetric enhancement and excretion of contrast by both kidneys. The visualized ureters and urinary bladder appear unremarkable. Stomach/Bowel: There is diffuse dilatation of small bowel measuring up to 5.5 cm in caliber. There is a transition in the distal ileum within the pelvis were there is mild thickening of the bowel loops and severe narrowing of the bowel lumen consistent with stricture. There is a transition within the pelvis (85/5 and 67/2). There is tethering of adjacent small bowel loops in this region consistent with adhesions. An enteroenteric fistula is not excluded. The colon is unremarkable. The appendix is normal. Vascular/Lymphatic: The abdominal aorta and IVC unremarkable. No portal venous gas. No adenopathy. Reproductive: Hysterectomy.  No adnexal masses. Other: None Musculoskeletal: Degenerative changes of the spine. No acute osseous pathology. IMPRESSION: High-grade distal small-bowel obstruction with transition within the pelvis secondary to structures an adhesion. Electronically Signed   By: Anner Crete M.D.   On: 12/04/2020 20:15    Microbiology: Recent Results (from the past 240 hour(s))  Urine culture     Status: Abnormal   Collection Time: 12/04/20  6:14 PM   Specimen: Urine, Random  Result Value Ref Range Status   Specimen Description   Final    URINE, RANDOM Performed at [facility] [address] [place], [place] [address]    Special Requests   Final    NONE Performed at [facility], [place] [address] [place], [place] [address]    Culture >=100,000 COLONIES/mL ESCHERICHIA COLI (A)  Final   Report Status 12/07/2020 FINAL  Final   Organism ID, Bacteria ESCHERICHIA COLI (A)  Final      Susceptibility   Escherichia coli - MIC*    AMPICILLIN >=32 RESISTANT  Resistant     CEFAZOLIN <=4 SENSITIVE Sensitive     CEFEPIME <=0.12 SENSITIVE Sensitive     CEFTRIAXONE <=0.25 SENSITIVE Sensitive     CIPROFLOXACIN <=0.25 SENSITIVE Sensitive     GENTAMICIN <=1 SENSITIVE Sensitive     IMIPENEM <=0.25 SENSITIVE Sensitive     NITROFURANTOIN <=16 SENSITIVE Sensitive     TRIMETH/SULFA <=20 SENSITIVE Sensitive     AMPICILLIN/SULBACTAM >=32 RESISTANT Resistant     PIP/TAZO <=4 SENSITIVE Sensitive     * >=100,000 COLONIES/mL ESCHERICHIA COLI  SARS CORONAVIRUS 2 (TAT 6-24 HRS) Nasopharyngeal Nasopharyngeal Swab     Status: None   Collection Time: 12/04/20  8:38 PM   Specimen: Nasopharyngeal Swab  Result Value Ref Range Status   SARS Coronavirus 2 NEGATIVE NEGATIVE Final    Comment: (NOTE) SARS-CoV-2 target nucleic acids are NOT DETECTED.  The SARS-CoV-2 RNA is generally detectable in upper and lower respiratory specimens during the acute phase of infection. Negative results do not preclude SARS-CoV-2 infection, do not rule out co-infections with other pathogens, and should not be used as the sole basis for treatment or other patient management decisions. Negative results must be combined with clinical observations, patient history, and  epidemiological information. The expected result is Negative.  Fact Sheet for Patients: SugarRoll.be  Fact Sheet for Healthcare Providers: https://www.woods-mathews.com/  This test is not yet approved or cleared by the [place] FDA and  has been authorized for detection and/or diagnosis of SARS-CoV-2 by FDA under an Emergency Use Authorization (EUA). This EUA will remain  in effect (meaning this test can be used) for the duration of the COVID-19 declaration under Se ction 564(b)(1) of the Act, 21 U.S.C. section 360bbb-3(b)(1), unless the authorization is terminated or revoked sooner.  Performed at [facility], [place] [address] [place], [place] [address]      Labs: Basic Metabolic Panel: Recent Labs  Lab 12/04/20 1725 12/05/20 0323 12/06/20 0306 12/07/20 0324 12/08/20 0307 12/08/20 1410  NA 134* 136 137 137 131*  --   K 3.4* 3.3* 3.5 4.5 4.1  --   CL 95* 98 101 102 103  --   CO2 24 25 21* 24 21*  --   GLUCOSE 144* 91 68* 115* 110*  --   BUN 23 21 20 13  7*  --   CREATININE 1.01* 0.82 0.74 0.66 0.69  --   CALCIUM 8.7* 7.9* 8.2* 8.4* 8.3*  --   MG  --  1.8 2.2 1.7 1.6* 1.4*  PHOS  --   --  2.5 1.7* 2.2*  --    Liver Function Tests: Recent Labs  Lab 12/04/20 1725 12/06/20 0306 12/07/20 0324 12/08/20 0307  AST 30 20 20 18   ALT 24 18 18 17   ALKPHOS 90 74 56 53  BILITOT 0.9 0.6 0.5 0.4  PROT 8.1 6.7 5.9* 6.2*  ALBUMIN 3.4* 2.9* 2.6* 2.8*   Recent Labs  Lab 12/04/20 1725  LIPASE 20   No results for input(s): AMMONIA in the last 168 hours. CBC: Recent Labs  Lab 12/04/20 1725 12/05/20 0323 12/06/20 0306 12/07/20 0324 12/08/20 0307  WBC 17.6* 14.2* 10.3 9.6 9.3  HGB 16.8* 14.4 15.1* 13.6 14.2  HCT 51.5* 44.2 49.0* 42.1 44.3  MCV 88.5 89.8 94.0 90.3 90.8  PLT 624* 460* 386 306 381   Cardiac Enzymes: No results for input(s): CKTOTAL, CKMB, CKMBINDEX, TROPONINI in the last 168 hours. BNP: BNP (last 3  results) No results for input(s): BNP in the last 8760  hours.  ProBNP (last 3 results) No results for input(s): PROBNP in the last 8760 hours.  CBG: No results for input(s): GLUCAP in the last 168 hours.     Signed:  Cristal Deer, MD [facility] 12/09/2020, 9:22 AM

## 2020-12-10 ENCOUNTER — Telehealth: Payer: Self-pay

## 2020-12-10 NOTE — Telephone Encounter (Signed)
-----   Message from Yetta Flock, MD sent at 12/08/2020  8:24 AM EST ----- Regarding: outpatient follow up [facility],  This is a patient of Dr. Fuller Plan who will be discharged from the hospital this week. She will need a follow up appointment with him or a PA in the next 1 month or so for Crohn's disease, if someone can help coordinate that sometime next week. Thanks  Richardson Landry

## 2020-12-10 NOTE — Telephone Encounter (Signed)
Patient has an appointment scheduled with Dr. Fuller Plan on Tuesday, 12/25/20 at 10:10 AM.

## 2020-12-12 ENCOUNTER — Inpatient Hospital Stay: Payer: 59 | Admitting: Family Medicine

## 2020-12-18 ENCOUNTER — Ambulatory Visit (INDEPENDENT_AMBULATORY_CARE_PROVIDER_SITE_OTHER): Payer: 59 | Admitting: Family Medicine

## 2020-12-18 ENCOUNTER — Other Ambulatory Visit: Payer: Self-pay

## 2020-12-18 VITALS — BP 144/96 | HR 102 | Temp 96.3°F | Wt 147.6 lb

## 2020-12-18 DIAGNOSIS — K50919 Crohn's disease, unspecified, with unspecified complications: Secondary | ICD-10-CM | POA: Diagnosis not present

## 2020-12-18 DIAGNOSIS — N3000 Acute cystitis without hematuria: Secondary | ICD-10-CM

## 2020-12-18 DIAGNOSIS — Z23 Encounter for immunization: Secondary | ICD-10-CM

## 2020-12-18 LAB — POCT URINALYSIS DIP (PROADVANTAGE DEVICE)
Blood, UA: NEGATIVE
Glucose, UA: NEGATIVE mg/dL
Ketones, POC UA: NEGATIVE mg/dL
Leukocytes, UA: NEGATIVE
Nitrite, UA: NEGATIVE
Protein Ur, POC: 30 mg/dL — AB
Specific Gravity, Urine: 1025
Urobilinogen, Ur: 0.2
pH, UA: 6 (ref 5.0–8.0)

## 2020-12-18 NOTE — Progress Notes (Signed)
   Subjective:    Patient ID: Cynthia [name], female    DOB: Jun 30, 1961, 60 y.o.   MRN: [id number]  HPI She is here for follow-up from recent hospitalization on February 1.  She was admitted for an exacerbation of Crohn's disease.  She had an NG tube, IV fluids and consultation from general surgery.  She also had evidence of a UTI.  She was sent home on magnesium oxide, Cipro, prednisone 40 mg and metronidazole.  She is still complaining of abdominal pain, anorexia but no nausea or vomiting fever or chills.  She has an appointment to see Dr. Fuller Plan on the 22nd she has had 3 doses of Stelara and can see no improvement in her symptoms.  She also is on a regular schedule to get Stelara as well as Covid boosters.  The discharge summary is still not done. She also had evidence of UTI.  Review of Systems     Objective:   Physical Exam Alert and complaining of abdominal pain.  Her bowel sounds are quite loud and active.  No tenderness to palpation or rebound.  Cardiac and lung exam normal. The urine dipstick is negative      Assessment & Plan:  COVID-19 vaccine administered - Plan: PFIZER Comirnaty(GRAY TOP)COVID-19 Vaccine  Crohn's disease with complication, unspecified gastrointestinal tract location [facility]) - Plan: Magnesium, CBC with Differential/Platelet, Comprehensive metabolic panel  Acute cystitis without hematuria - Plan: Urinalysis Dipstick Email message sent to Dr. Fuller Plan concerning this to get his thoughts on possibly using a little higher dose of prednisone.

## 2020-12-19 LAB — COMPREHENSIVE METABOLIC PANEL
ALT: 19 IU/L (ref 0–32)
AST: 18 IU/L (ref 0–40)
Albumin/Globulin Ratio: 1.4 (ref 1.2–2.2)
Albumin: 3.6 g/dL — ABNORMAL LOW (ref 3.8–4.8)
Alkaline Phosphatase: 61 IU/L (ref 44–121)
BUN/Creatinine Ratio: 12 (ref 12–28)
BUN: 11 mg/dL (ref 8–27)
Bilirubin Total: 0.7 mg/dL (ref 0.0–1.2)
CO2: 25 mmol/L (ref 20–29)
Calcium: 9.6 mg/dL (ref 8.7–10.3)
Chloride: 101 mmol/L (ref 96–106)
Creatinine, Ser: 0.93 mg/dL (ref 0.57–1.00)
GFR calc Af Amer: 77 mL/min/{1.73_m2} (ref >59)
GFR calc non Af Amer: 67 mL/min/{1.73_m2} (ref >59)
Globulin, Total: 2.6 g/dL (ref 1.5–4.5)
Glucose: 82 mg/dL (ref 65–99)
Potassium: 4.2 mmol/L (ref 3.5–5.2)
Sodium: 140 mmol/L (ref 134–144)
Total Protein: 6.2 g/dL (ref 6.0–8.5)

## 2020-12-19 LAB — CBC WITH DIFFERENTIAL/PLATELET
Basophils Absolute: 0 10*3/uL (ref 0.0–0.2)
Basos: 0 %
EOS (ABSOLUTE): 0.1 10*3/uL (ref 0.0–0.4)
Eos: 1 %
Hematocrit: 42.9 % (ref 34.0–46.6)
Hemoglobin: 14 g/dL (ref 11.1–15.9)
Immature Grans (Abs): 0.1 10*3/uL (ref 0.0–0.1)
Immature Granulocytes: 1 %
Lymphocytes Absolute: 2.8 10*3/uL (ref 0.7–3.1)
Lymphs: 21 %
MCH: 29.3 pg (ref 26.6–33.0)
MCHC: 32.6 g/dL (ref 31.5–35.7)
MCV: 90 fL (ref 79–97)
Monocytes Absolute: 0.9 10*3/uL (ref 0.1–0.9)
Monocytes: 7 %
Neutrophils Absolute: 9.3 10*3/uL — ABNORMAL HIGH (ref 1.4–7.0)
Neutrophils: 70 %
Platelets: 382 10*3/uL (ref 150–450)
RBC: 4.78 x10E6/uL (ref 3.77–5.28)
RDW: 13.1 % (ref 11.7–15.4)
WBC: 13.2 10*3/uL — ABNORMAL HIGH (ref 3.4–10.8)

## 2020-12-19 LAB — MAGNESIUM: Magnesium: 2.3 mg/dL (ref 1.6–2.3)

## 2020-12-20 ENCOUNTER — Ambulatory Visit: Payer: 59

## 2020-12-20 ENCOUNTER — Telehealth: Payer: Self-pay

## 2020-12-20 ENCOUNTER — Other Ambulatory Visit: Payer: Self-pay

## 2020-12-20 NOTE — Telephone Encounter (Signed)
Needing to confirm that the patient can have the COVID vaccine outside of the time frame of 3 days before or after her Stelara injection as was recommended in September when the patient was new on Stelara. Called the Duwayne Heck Immunology Specialist with [facility]. She works with [employer], maintenance. She said if the vaccine is not a live vaccine, there are no restrictions to receiving the vaccine. COVID is not a live vaccine. Advised nurse Joelene Millin, the patient may receive her COVID vaccine outside of the 3 day window.

## 2020-12-20 NOTE — Telephone Encounter (Signed)
Pt was advised that she can get her covid vaccine outside that three days . [place]

## 2020-12-25 ENCOUNTER — Other Ambulatory Visit: Payer: Self-pay

## 2020-12-25 ENCOUNTER — Ambulatory Visit (INDEPENDENT_AMBULATORY_CARE_PROVIDER_SITE_OTHER): Payer: 59 | Admitting: Gastroenterology

## 2020-12-25 ENCOUNTER — Ambulatory Visit: Payer: 59

## 2020-12-25 ENCOUNTER — Encounter: Payer: Self-pay | Admitting: Gastroenterology

## 2020-12-25 VITALS — BP 118/86 | HR 114 | Ht 63.0 in | Wt 135.5 lb

## 2020-12-25 DIAGNOSIS — K50819 Crohn's disease of both small and large intestine with unspecified complications: Secondary | ICD-10-CM

## 2020-12-25 DIAGNOSIS — K56609 Unspecified intestinal obstruction, unspecified as to partial versus complete obstruction: Secondary | ICD-10-CM

## 2020-12-25 MED ORDER — PREDNISONE 10 MG PO TABS: ORAL_TABLET | ORAL | 0 refills | Status: DC

## 2020-12-25 MED ORDER — ONDANSETRON 4 MG PO TBDP: 4.0000 mg | ORAL_TABLET | Freq: Three times a day (TID) | ORAL | 5 refills | Status: DC | PRN

## 2020-12-25 MED ORDER — HYOSCYAMINE SULFATE 0.125 MG PO TABS: 0.2500 mg | ORAL_TABLET | ORAL | 11 refills | Status: DC | PRN

## 2020-12-25 NOTE — Patient Instructions (Addendum)
Dr Fuller Plan has advised that you be on a prednisone taper. The taper instructions are as follows: Prednisone 40 mg daily x 14 days Prednisone 35 mg daily x 7 days Prednisone 30 mg daily x 7 days Prednisone 25 mg daily x 7 days Prednisone 20 mg daily x 7 days Prednisone 15 mg daily x 7 days Prednisone 10 mg daily x 7 days Prednisone 5 mg daily x 14 days Then, discontinue.  We will send a prescription for #158 53m tablets to your pharmacy.   We also sent levsin and zofran to your pharmacy.  Thank you for choosing [facility] and [facility].  MPricilla Riffle SDagoberto Ligas, MD., FMarval Regal

## 2020-12-25 NOTE — Progress Notes (Addendum)
    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  She is accompanied by her husband.  Recently started Stelara as outpatient and she has received 3 doses with her most recent dose on February 14th.  She relates she had a limited supply of prednisone 40 mg and she ran out and has not been on prednisone for the past several days.  She relates she did well initially following discharge however over the past week she has had worsening problems with postprandial abdominal distention, gurgling, crampy abdominal pain and because of this she has been eating much less.  She mainly is having soups and one Boost per day.  She notes frequent nausea.  No fevers, chills, vomiting, diarrhea, hematochezia.  Current Medications, Allergies, Past Medical History, Past Surgical History, Family History and Social History were reviewed in [employer] record.   Physical Exam: General: Well developed, well nourished, no acute distress Head: Normocephalic and atraumatic Eyes: Sclerae anicteric, EOMI Ears: Normal auditory acuity Mouth: Not examined, mask on during Covid-19 pandemic Lungs: Clear throughout to auscultation Heart: Regular rate and rhythm; no murmurs, rubs or bruits Abdomen: Soft, non tender and mildly distended. No masses, hepatosplenomegaly or hernias noted.  Hyperactive bowel sounds with occasional tinkling Rectal: Not done Musculoskeletal: Symmetrical with no gross deformities  Pulses:  Normal pulses noted Extremities: No clubbing, cyanosis, edema or deformities noted Neurological: Alert oriented x 4, grossly nonfocal Psychological:  Alert and cooperative. Normal mood and affect   Assessment and Recommendations:  1. Crohn's ileocolitis with an ileal stricture, enteroenteric fistula and SBO recently hospitalized. She responded to NG  suction, IV Solumedrol and IV ceftriaxone.  She was treated for a UTI. She completed 7 days of Cipro and Flagyl after discharge.  Unfortunately she has been off prednisone for the past several days as her prescription ran out.  She has obstructive symptoms by history and exam today.    Advised a full liquid diet and advance gradually as tolerated.   2-3 Boost per day if intake is not adequate.  Prednisone 40 mg qd for 1 week and then a 5 mg/week taper with 2 weeks at 5 mg to finish.  Continue Stelara.   Change hyoscyamine to 0.125 mg 1-2 p.o. tid ac and every 4 hours as needed.  Refill Zofran 4 mg ODT 3 times daily as needed.   We discussed rehospitalization if her symptoms do not improve over then next several days.  We discussed the possibility of surgery to manage her disease. REV in 2 weeks.

## 2021-01-08 ENCOUNTER — Other Ambulatory Visit: Payer: Self-pay

## 2021-01-08 ENCOUNTER — Emergency Department (HOSPITAL_COMMUNITY): Payer: 59

## 2021-01-08 ENCOUNTER — Encounter (HOSPITAL_COMMUNITY): Payer: Self-pay

## 2021-01-08 ENCOUNTER — Inpatient Hospital Stay (HOSPITAL_COMMUNITY): Payer: 59

## 2021-01-08 ENCOUNTER — Inpatient Hospital Stay (HOSPITAL_COMMUNITY)
Admission: EM | Admit: 2021-01-08 | Discharge: 2021-01-13 | DRG: 386 | Disposition: A | Discharge status: Home / Self Care | Payer: 59 | Attending: Internal Medicine | Admitting: Internal Medicine

## 2021-01-08 ENCOUNTER — Ambulatory Visit: Payer: 59 | Admitting: Physician Assistant

## 2021-01-08 DIAGNOSIS — K219 Gastro-esophageal reflux disease without esophagitis: Secondary | ICD-10-CM | POA: Diagnosis present

## 2021-01-08 DIAGNOSIS — K56609 Unspecified intestinal obstruction, unspecified as to partial versus complete obstruction: Secondary | ICD-10-CM | POA: Diagnosis present

## 2021-01-08 DIAGNOSIS — E871 Hypo-osmolality and hyponatremia: Secondary | ICD-10-CM | POA: Diagnosis present

## 2021-01-08 DIAGNOSIS — Z832 Family history of diseases of the blood and blood-forming organs and certain disorders involving the immune mechanism: Secondary | ICD-10-CM | POA: Diagnosis not present

## 2021-01-08 DIAGNOSIS — Z20822 Contact with and (suspected) exposure to covid-19: Secondary | ICD-10-CM | POA: Diagnosis present

## 2021-01-08 DIAGNOSIS — G2581 Restless legs syndrome: Secondary | ICD-10-CM | POA: Diagnosis present

## 2021-01-08 DIAGNOSIS — K660 Peritoneal adhesions (postprocedural) (postinfection): Secondary | ICD-10-CM | POA: Diagnosis present

## 2021-01-08 DIAGNOSIS — K5090 Crohn's disease, unspecified, without complications: Secondary | ICD-10-CM | POA: Diagnosis present

## 2021-01-08 DIAGNOSIS — K2100 Gastro-esophageal reflux disease with esophagitis, without bleeding: Secondary | ICD-10-CM | POA: Diagnosis not present

## 2021-01-08 DIAGNOSIS — F17210 Nicotine dependence, cigarettes, uncomplicated: Secondary | ICD-10-CM | POA: Diagnosis present

## 2021-01-08 DIAGNOSIS — K5669 Other partial intestinal obstruction: Secondary | ICD-10-CM | POA: Diagnosis present

## 2021-01-08 DIAGNOSIS — E669 Obesity, unspecified: Secondary | ICD-10-CM | POA: Diagnosis present

## 2021-01-08 DIAGNOSIS — Z833 Family history of diabetes mellitus: Secondary | ICD-10-CM

## 2021-01-08 DIAGNOSIS — Z0189 Encounter for other specified special examinations: Secondary | ICD-10-CM | POA: Diagnosis not present

## 2021-01-08 DIAGNOSIS — I10 Essential (primary) hypertension: Secondary | ICD-10-CM | POA: Diagnosis not present

## 2021-01-08 DIAGNOSIS — R17 Unspecified jaundice: Secondary | ICD-10-CM | POA: Diagnosis present

## 2021-01-08 DIAGNOSIS — K50013 Crohn's disease of small intestine with fistula: Secondary | ICD-10-CM | POA: Diagnosis present

## 2021-01-08 DIAGNOSIS — K50012 Crohn's disease of small intestine with intestinal obstruction: Principal | ICD-10-CM | POA: Diagnosis present

## 2021-01-08 DIAGNOSIS — Z8719 Personal history of other diseases of the digestive system: Secondary | ICD-10-CM

## 2021-01-08 DIAGNOSIS — Z79899 Other long term (current) drug therapy: Secondary | ICD-10-CM

## 2021-01-08 DIAGNOSIS — F418 Other specified anxiety disorders: Secondary | ICD-10-CM | POA: Diagnosis not present

## 2021-01-08 DIAGNOSIS — D84821 Immunodeficiency due to drugs: Secondary | ICD-10-CM

## 2021-01-08 DIAGNOSIS — F419 Anxiety disorder, unspecified: Secondary | ICD-10-CM | POA: Diagnosis present

## 2021-01-08 DIAGNOSIS — K50813 Crohn's disease of both small and large intestine with fistula: Secondary | ICD-10-CM

## 2021-01-08 DIAGNOSIS — F32A Depression, unspecified: Secondary | ICD-10-CM | POA: Diagnosis present

## 2021-01-08 DIAGNOSIS — E876 Hypokalemia: Secondary | ICD-10-CM | POA: Diagnosis present

## 2021-01-08 DIAGNOSIS — K50812 Crohn's disease of both small and large intestine with intestinal obstruction: Secondary | ICD-10-CM | POA: Diagnosis not present

## 2021-01-08 DIAGNOSIS — F17200 Nicotine dependence, unspecified, uncomplicated: Secondary | ICD-10-CM | POA: Diagnosis not present

## 2021-01-08 DIAGNOSIS — K5000 Crohn's disease of small intestine without complications: Secondary | ICD-10-CM | POA: Diagnosis not present

## 2021-01-08 DIAGNOSIS — K632 Fistula of intestine: Secondary | ICD-10-CM

## 2021-01-08 DIAGNOSIS — Z86010 Personal history of colonic polyps: Secondary | ICD-10-CM

## 2021-01-08 LAB — COMPREHENSIVE METABOLIC PANEL
ALT: 22 U/L (ref 0–44)
AST: 23 U/L (ref 15–41)
Albumin: 2.8 g/dL — ABNORMAL LOW (ref 3.5–5.0)
Alkaline Phosphatase: 56 U/L (ref 38–126)
Anion gap: 15 (ref 5–15)
BUN: 24 mg/dL — ABNORMAL HIGH (ref 8–23)
CO2: 22 mmol/L (ref 22–32)
Calcium: 7.8 mg/dL — ABNORMAL LOW (ref 8.9–10.3)
Chloride: 95 mmol/L — ABNORMAL LOW (ref 98–111)
Creatinine, Ser: 1 mg/dL (ref 0.44–1.00)
GFR, Estimated: >60 mL/min (ref >60)
Glucose, Bld: 135 mg/dL — ABNORMAL HIGH (ref 70–99)
Potassium: 2.4 mmol/L — CL (ref 3.5–5.1)
Sodium: 132 mmol/L — ABNORMAL LOW (ref 135–145)
Total Bilirubin: 1.8 mg/dL — ABNORMAL HIGH (ref 0.3–1.2)
Total Protein: 5.7 g/dL — ABNORMAL LOW (ref 6.5–8.1)

## 2021-01-08 LAB — BILIRUBIN, FRACTIONATED(TOT/DIR/INDIR)
Bilirubin, Direct: 0.3 mg/dL — ABNORMAL HIGH (ref 0.0–0.2)
Indirect Bilirubin: 1.4 mg/dL — ABNORMAL HIGH (ref 0.3–0.9)
Total Bilirubin: 1.7 mg/dL — ABNORMAL HIGH (ref 0.3–1.2)

## 2021-01-08 LAB — RESP PANEL BY RT-PCR (FLU A&B, COVID) ARPGX2
Influenza A by PCR: NEGATIVE
Influenza B by PCR: NEGATIVE
SARS Coronavirus 2 by RT PCR: NEGATIVE

## 2021-01-08 LAB — LIPASE, BLOOD: Lipase: 22 U/L (ref 11–51)

## 2021-01-08 LAB — URINALYSIS, ROUTINE W REFLEX MICROSCOPIC
Bilirubin Urine: NEGATIVE
Glucose, UA: NEGATIVE mg/dL
Hgb urine dipstick: NEGATIVE
Ketones, ur: 20 mg/dL — AB
Leukocytes,Ua: NEGATIVE
Nitrite: NEGATIVE
Protein, ur: NEGATIVE mg/dL
Specific Gravity, Urine: >1.046 — ABNORMAL HIGH (ref 1.005–1.030)
pH: 6 (ref 5.0–8.0)

## 2021-01-08 LAB — MAGNESIUM: Magnesium: 2.1 mg/dL (ref 1.7–2.4)

## 2021-01-08 LAB — CBC
HCT: 43.4 % (ref 36.0–46.0)
Hemoglobin: 14.3 g/dL (ref 12.0–15.0)
MCH: 28.8 pg (ref 26.0–34.0)
MCHC: 32.9 g/dL (ref 30.0–36.0)
MCV: 87.5 fL (ref 80.0–100.0)
Platelets: 329 10*3/uL (ref 150–400)
RBC: 4.96 MIL/uL (ref 3.87–5.11)
RDW: 13.4 % (ref 11.5–15.5)
WBC: 13.7 10*3/uL — ABNORMAL HIGH (ref 4.0–10.5)
nRBC: 0 % (ref 0.0–0.2)

## 2021-01-08 LAB — SEDIMENTATION RATE: Sed Rate: 0 mm/hr (ref 0–22)

## 2021-01-08 LAB — C-REACTIVE PROTEIN: CRP: 0.5 mg/dL (ref <1.0)

## 2021-01-08 LAB — PHOSPHORUS: Phosphorus: 2.4 mg/dL — ABNORMAL LOW (ref 2.5–4.6)

## 2021-01-08 MED ORDER — POTASSIUM CHLORIDE IN NACL 20-0.9 MEQ/L-% IV SOLN
Freq: Once | INTRAVENOUS | Status: AC
  Filled 2021-01-08: qty 1000

## 2021-01-08 MED ORDER — IOHEXOL 300 MG/ML  SOLN
100.0000 mL | Freq: Once | INTRAMUSCULAR | Status: AC | PRN
  Administered 2021-01-08: 100 mL via INTRAVENOUS

## 2021-01-08 MED ORDER — FENTANYL CITRATE (PF) 100 MCG/2ML IJ SOLN
12.5000 ug | INTRAMUSCULAR | Status: DC | PRN
Start: 2021-01-08 — End: 2021-01-13
  Administered 2021-01-08 – 2021-01-09 (×4): 12.5 ug via INTRAVENOUS
  Filled 2021-01-08 (×4): qty 2

## 2021-01-08 MED ORDER — METHYLPREDNISOLONE SODIUM SUCC 40 MG IJ SOLR
30.0000 mg | Freq: Two times a day (BID) | INTRAMUSCULAR | Status: DC
  Administered 2021-01-08 – 2021-01-10 (×4): 30 mg via INTRAVENOUS
  Filled 2021-01-08 (×4): qty 1

## 2021-01-08 MED ORDER — ONDANSETRON HCL 4 MG PO TABS: 4.0000 mg | ORAL_TABLET | Freq: Four times a day (QID) | ORAL | Status: DC | PRN

## 2021-01-08 MED ORDER — FENTANYL CITRATE (PF) 100 MCG/2ML IJ SOLN: 100.0000 ug | Freq: Once | INTRAMUSCULAR | Status: DC

## 2021-01-08 MED ORDER — SODIUM CHLORIDE 0.9 % IV BOLUS
1000.0000 mL | Freq: Once | INTRAVENOUS | Status: AC
  Administered 2021-01-08: 1000 mL via INTRAVENOUS

## 2021-01-08 MED ORDER — FENTANYL CITRATE (PF) 100 MCG/2ML IJ SOLN
50.0000 ug | Freq: Once | INTRAMUSCULAR | Status: AC
  Administered 2021-01-08: 50 ug via INTRAVENOUS
  Filled 2021-01-08: qty 2

## 2021-01-08 MED ORDER — ONDANSETRON HCL 4 MG/2ML IJ SOLN: 4.0000 mg | Freq: Four times a day (QID) | INTRAMUSCULAR | Status: DC | PRN

## 2021-01-08 MED ORDER — FENTANYL CITRATE (PF) 100 MCG/2ML IJ SOLN: 50.0000 ug | Freq: Once | INTRAMUSCULAR | Status: DC

## 2021-01-08 MED ORDER — ACETAMINOPHEN 325 MG PO TABS: 650.0000 mg | ORAL_TABLET | Freq: Four times a day (QID) | ORAL | Status: DC | PRN

## 2021-01-08 MED ORDER — DIATRIZOATE MEGLUMINE & SODIUM 66-10 % PO SOLN
90.0000 mL | Freq: Once | ORAL | Status: AC
  Administered 2021-01-08: 90 mL via NASOGASTRIC
  Filled 2021-01-08: qty 90

## 2021-01-08 MED ORDER — METRONIDAZOLE IN NACL 5-0.79 MG/ML-% IV SOLN
500.0000 mg | Freq: Three times a day (TID) | INTRAVENOUS | Status: DC
  Administered 2021-01-08 – 2021-01-12 (×13): 500 mg via INTRAVENOUS
  Filled 2021-01-08 (×16): qty 100

## 2021-01-08 MED ORDER — ACETAMINOPHEN 650 MG RE SUPP: 650.0000 mg | Freq: Four times a day (QID) | RECTAL | Status: DC | PRN

## 2021-01-08 MED ORDER — POTASSIUM CHLORIDE 10 MEQ/100ML IV SOLN
10.0000 meq | Freq: Once | INTRAVENOUS | Status: AC
  Administered 2021-01-08: 10 meq via INTRAVENOUS
  Filled 2021-01-08: qty 100

## 2021-01-08 MED ORDER — LORAZEPAM 2 MG/ML IJ SOLN
0.5000 mg | Freq: Three times a day (TID) | INTRAMUSCULAR | Status: DC | PRN
  Administered 2021-01-11 – 2021-01-12 (×2): 0.5 mg via INTRAVENOUS
  Filled 2021-01-08 (×2): qty 1

## 2021-01-08 MED ORDER — CIPROFLOXACIN IN D5W 400 MG/200ML IV SOLN
400.0000 mg | Freq: Two times a day (BID) | INTRAVENOUS | Status: DC
  Administered 2021-01-08 – 2021-01-13 (×10): 400 mg via INTRAVENOUS
  Filled 2021-01-08 (×9): qty 200

## 2021-01-08 MED ORDER — POTASSIUM CHLORIDE 10 MEQ/100ML IV SOLN: 10.0000 meq | INTRAVENOUS | Status: DC

## 2021-01-08 MED ORDER — SODIUM CHLORIDE 0.9 % IV SOLN: INTRAVENOUS | Status: DC

## 2021-01-08 MED ORDER — PHENYLEPHRINE HCL 1 % NA SOLN
1.0000 [drp] | Freq: Four times a day (QID) | NASAL | Status: DC | PRN
  Filled 2021-01-08: qty 15

## 2021-01-08 MED ORDER — POTASSIUM CHLORIDE 10 MEQ/100ML IV SOLN
10.0000 meq | INTRAVENOUS | Status: AC
  Administered 2021-01-08 (×4): 10 meq via INTRAVENOUS
  Filled 2021-01-08 (×5): qty 100

## 2021-01-08 MED ORDER — HYDROMORPHONE HCL 1 MG/ML IJ SOLN
1.0000 mg | Freq: Once | INTRAMUSCULAR | Status: AC
  Administered 2021-01-08: 1 mg via INTRAVENOUS
  Filled 2021-01-08: qty 1

## 2021-01-08 MED ORDER — POTASSIUM CHLORIDE IN NACL 20-0.9 MEQ/L-% IV SOLN
Freq: Once | INTRAVENOUS | Status: DC
  Filled 2021-01-08: qty 1000

## 2021-01-08 NOTE — Consult Note (Signed)
[facility] Surgery Consult Note  [facility] 08/11/1961  [id number].    Requesting MD: Dollene Cleveland Chief Complaint: Abdominal pain, nausea and vomiting Reason for Consult: Recurrent SBO; Crohn's disease with enteroenteric/enterocolonic fistula/stricture  HPI:  Patient is a 60 year old female with a history of hypertension, depression anxiety, ongoing tobacco use, Crohn's disease with no enteroenteric and enterocolonic fistula who was seen in the ED on 12/05/2020 with a partial small bowel obstruction.  Patient was on Stelara (last dose 12/17/20) and steroids.  Her symptoms improved with conservative management including NG decompression, bowel rest.  During that hospitalization the plan was to avoid surgery if possible, but she had some severe narrowing which may the need for surgery possible.  Note shows she did well initially after discharge on 12/09/2020.  She was seen by Dr. Fuller Plan on 12/25/2020, at which time she was complaining of some postprandial abdominal distention and gurgling symptoms.  She had run out of prednisone and this was restarted.  She said her discomfort never really improved even after restarting the steroids.  She has been sick about the last 5 days ago but started having nausea and vomiting about 2 days ago.She presented to the ED this morning with complaints of sharp umbilical abdominal pain, nausea and vomiting this morning.  She was transferred to the ED at [facility].  Work-up in the ED shows she is afebrile slightly tachycardic.  Blood pressure stable.  Labs shows a sodium of 132, potassium of 2.4, glucose 135, albumin 2.8, total protein 5.7, total bilirubin 1.8.  WBC 13.7, H/H 14.3/43.4, platelets 329,000, urinalysis is negative. CT scan shows a high-grade small bowel obstruction with markedly dilated fluid-filled loops of the small bowel with air-fluid levels extending into the pelvis.  There is tight stricture/adhesive process in the mid lower central  pelvis demonstrated on CT scan likely related to Crohn's disease.  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  Colon was relatively decompressed.  ROS: Review of Systems  Constitutional: Positive for malaise/fatigue (She has had to have a friend come live with her because of her weakness and inability to mobilize safely on her own.) and weight loss (23 lbs since discharge 12/09/20). Negative for chills, diaphoresis and fever.  HENT: Negative.        Complained of left ear pain since she had the NG placed.  Eyes: Negative.   Respiratory: Negative.  Negative for cough, hemoptysis, sputum production, shortness of breath and wheezing.        She has had all 3 Covid vaccines  Cardiovascular: Negative.   Gastrointestinal: Positive for abdominal pain, constipation (She reports constipation which is new for her.), heartburn, nausea and vomiting. Negative for blood in stool, diarrhea and melena.  Genitourinary: Negative.   Musculoskeletal:       She reports being significantly deconditioned and needing help at home.  Skin: Negative.   Neurological: Negative.   Endo/Heme/Allergies: Negative.   Psychiatric/Behavioral: Positive for depression. The patient is nervous/anxious.     Family History  Problem Relation Age of Onset  . Diabetes Mother   . Dementia Mother   . Clotting disorder Father   . Macular degeneration Father   . Crohn's disease Sister   . Colon cancer Neg Hx   . Esophageal cancer Neg Hx   . Pancreatic cancer Neg Hx   . Stomach cancer Neg Hx   . Liver disease Neg Hx     Past Medical History:  Diagnosis Date  . Anemia 2012  . Anxiety   . Bronchitis   . Crohn's ileitis ([place])   . Depression   . GERD (gastroesophageal reflux disease)   . Headache(784.0)    MIGRAINE  . Heart murmur   . Hiatal hernia   . Hypertension   . IBS (irritable bowel syndrome)   . Incontinence   . MVP (mitral valve prolapse)   . RLS  (restless legs syndrome)   . Tubular adenoma of colon 08/2019    Past Surgical History:  Procedure Laterality Date  . ABDOMINAL HYSTERECTOMY  06/17/2011   Procedure: HYSTERECTOMY ABDOMINAL;  Surgeon: Arloa Koh;  Location: [facility];  Service: Gynecology;  Laterality: N/A;  Converted to Abdominal Hysterectomy with lysis of adhesions   . ANTERIOR AND POSTERIOR REPAIR  02/25/2012   Procedure: ANTERIOR (CYSTOCELE) AND POSTERIOR REPAIR (RECTOCELE);  Surgeon: Daria Pastures, MD;  Location: [facility];  Service: Gynecology;  Laterality: N/A;  ANterior cystocele repair ONLY  . BIOPSY  08/29/2019   Procedure: BIOPSY;  Surgeon: Lavena Bullion, DO;  Location: [facility];  Service: Gastroenterology;;  . BLADDER SUSPENSION  02/25/2012   Procedure: TRANSVAGINAL TAPE (TVT) PROCEDURE;  Surgeon: Daria Pastures, MD;  Location: [facility];  Service: Gynecology;  Laterality: N/A;  . COLONOSCOPY WITH PROPOFOL N/A 08/29/2019   Procedure: COLONOSCOPY WITH PROPOFOL;  Surgeon: Lavena Bullion, DO;  Location: [facility];  Service: Gastroenterology;  Laterality: N/A;  . CYSTOSCOPY  02/25/2012   Procedure: CYSTOSCOPY;  Surgeon: Daria Pastures, MD;  Location: [facility];  Service: Gynecology;  Laterality: N/A;  . KNEE ARTHROSCOPY  2010  . LAPAROSCOPIC ASSISTED VAGINAL HYSTERECTOMY  06/17/2011   Procedure: LAPAROSCOPIC ASSISTED VAGINAL HYSTERECTOMY;  Surgeon: Arloa Koh;  Location: [facility];  Service: Gynecology;  Laterality: N/A;  Attempted   . POLYPECTOMY  08/29/2019   Procedure: POLYPECTOMY;  Surgeon: Lavena Bullion, DO;  Location: [facility];  Service: Gastroenterology;;  . SALPINGOOPHORECTOMY  06/17/2011   Procedure: SALPINGO OOPHERECTOMY;  Surgeon: Arloa Koh;  Location: [facility];  Service: Gynecology;  Laterality: Bilateral;  . TONSILLECTOMY      Social History:  reports that she has been smoking cigarettes. She has been smoking about 0.50 packs per day. She has never used smokeless tobacco. She  reports previous alcohol use. She reports current drug use. Drug: Cocaine. Tobacco: She smokes since age of 33 or 89; she has had none for the last month, she says she just does not have the energy for it. EtOH: None Drugs: None Allergies:  Allergies  Allergen Reactions  . Iodine     REACTION: in eye gtts Iodine eye drops; pt has no reactions to CT contrast    (Not in a hospital admission)   Blood pressure 110/89, pulse 97, temperature 98 F (36.7 C), temperature source Oral, resp. rate 13, last menstrual period 06/10/2011, SpO2 97 %. Physical Exam:  General: Ill-appearing 60 year old female NG in place.  Based on the x-ray we advance it some and started the suction.  Minimal NG drainage after the tube was placed on suction. HEENT: head is normocephalic, atraumatic.  Sclera are noninjected.  Pupils are equal, patient is tearing from the NG placement.  Ears and nose without any masses or lesions.  Mouth is pink and moist Heart: regular, rate, and rhythm.  Normal s1,s2. No obvious murmurs, gallops, or rubs noted.  Palpable radial and pedal pulses bilaterally Lungs: CTAB, no wheezes, rhonchi, or rales noted.  Respiratory effort  nonlabored Abd: She is distended and uncomfortable.  Bowel sounds are high-pitched, prior hysterectomy salpingo-oophorectomy, no masses, hernias, or organomegaly MS: all 4 extremities are symmetrical with no cyanosis, clubbing, or edema. Skin: warm and dry with no masses, lesions, or rashes Neuro: Cranial nerves 2-12 grossly intact, sensation is normal throughout Psych: A&Ox3 with an appropriate affect.   Results for orders placed or performed during the hospital encounter of 01/08/21 (from the past 48 hour(s))  Lipase, blood     Status: None   Collection Time: 01/08/21  6:18 AM  Result Value Ref Range   Lipase 22 11 - 51 U/L    Comment: Performed at [facility], [place] [address] [place], [place] [address]  Comprehensive metabolic panel      Status: Abnormal   Collection Time: 01/08/21  6:18 AM  Result Value Ref Range   Sodium 132 (L) 135 - 145 mmol/L   Potassium 2.4 (LL) 3.5 - 5.1 mmol/L    Comment: CRITICAL RESULT CALLED TO, READ BACK BY AND VERIFIED WITH: J.SMITH RN AT 6045 ON 01/08/21 BY S.VANHOORNE    Chloride 95 (L) 98 - 111 mmol/L   CO2 22 22 - 32 mmol/L   Glucose, Bld 135 (H) 70 - 99 mg/dL    Comment: Glucose reference range applies only to samples taken after fasting for at least 8 hours.   BUN 24 (H) 8 - 23 mg/dL   Creatinine, Ser 1.00 0.44 - 1.00 mg/dL   Calcium 7.8 (L) 8.9 - 10.3 mg/dL   Total Protein 5.7 (L) 6.5 - 8.1 g/dL   Albumin 2.8 (L) 3.5 - 5.0 g/dL   AST 23 15 - 41 U/L   ALT 22 0 - 44 U/L   Alkaline Phosphatase 56 38 - 126 U/L   Total Bilirubin 1.8 (H) 0.3 - 1.2 mg/dL   GFR, Estimated >60 >60 mL/min    Comment: (NOTE) Calculated using the CKD-EPI Creatinine Equation (2021)    Anion gap 15 5 - 15    Comment: Performed at [facility], [place] [address], [place], [place] [address]  CBC     Status: Abnormal   Collection Time: 01/08/21  6:18 AM  Result Value Ref Range   WBC 13.7 (H) 4.0 - 10.5 K/uL   RBC 4.96 3.87 - 5.11 MIL/uL   Hemoglobin 14.3 12.0 - 15.0 g/dL   HCT 43.4 36.0 - 46.0 %   MCV 87.5 80.0 - 100.0 fL   MCH 28.8 26.0 - 34.0 pg   MCHC 32.9 30.0 - 36.0 g/dL   RDW 13.4 11.5 - 15.5 %   Platelets 329 150 - 400 K/uL   nRBC 0.0 0.0 - 0.2 %    Comment: Performed at [facility], [place] [address], [place], [place] [address]  Magnesium     Status: None   Collection Time: 01/08/21  6:18 AM  Result Value Ref Range   Magnesium 2.1 1.7 - 2.4 mg/dL    Comment: Performed at [facility], [place] [address], [place], [place] [address]   CT Abdomen Pelvis W Contrast  Result Date: 01/08/2021 CLINICAL DATA:  Periumbilical abdominal pain. History of Crohn's disease. EXAM: CT ABDOMEN AND PELVIS WITH CONTRAST TECHNIQUE: Multidetector CT imaging of the  abdomen and pelvis was performed using the standard protocol following bolus administration of intravenous contrast. CONTRAST:  140m OMNIPAQUE IOHEXOL 300 MG/ML  SOLN COMPARISON:  12/04/2020 FINDINGS: Lower chest: The lung bases are clear of acute process. No  pleural effusion or pulmonary lesions. The heart is normal in size. No pericardial effusion. The distal esophagus and aorta are unremarkable. Hepatobiliary: No hepatic lesions or intrahepatic biliary dilatation. No common bile duct dilatation. Pancreas: Moderate pancreatic atrophy but no mass or inflammation. Spleen: Normal size.  No focal lesions. Adrenals/Urinary Tract: The adrenal glands and kidneys are unremarkable. The bladder is unremarkable. Stomach/Bowel: The stomach is unremarkable. The duodenum is unremarkable. High-grade small-bowel obstruction with markedly dilated fluid-filled loops of small bowel with air-fluid levels extending down into the pelvis. Tight stricture or adhesive process in the mid to lower central pelvis as demonstrated on the prior CT scan likely related to the patient's Crohn's disease. 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 Some associated mucosal enhancement could be local inflammatory process or possible active Crohn's disease. No definite abscess. Second area of probable adhesive change the mid small bowel with definite caliber change centrally at the level of the iliac crest. The colon is relatively decompressed. Vascular/Lymphatic: The aorta is normal in caliber. No dissection. The branch vessels are patent. The major venous structures are patent. No mesenteric or retroperitoneal mass or adenopathy. Small scattered lymph nodes are noted. Reproductive: Surgically absent. Other: No free air or free fluid collections to suggest perforation. Musculoskeletal: No significant bony findings. IMPRESSION: 1. High-grade distal small-bowel obstruction likely  due to stricture or adhesive process in the mid to lower central pelvis as demonstrated on the prior CT scan. 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 Second area of probable adhesive change in the mid small bowel with definite caliber change centrally at the level of the iliac crest. 2. No free air or free fluid collections to suggest perforation. Electronically Signed   By: Marijo Sanes M.D.   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 Hassell Done we were hesitant to operate on her during her last admission; with the hopes that steroids/Stelara would resolve symptoms and she could avoid surgery.  She still has narrowing noted on CT scan and patient symptoms have been ongoing since she came off her steroids initially after her discharge.  GI has continued her steroids and Stelara.  Last dose of Stelara was 12/17/2020.  I recommend GI consult, conservative management, including bowel rest, IV fluid hydration, NG decompression, for now.  Earnstine Regal [facility] Surgery 01/08/2021, 9:57 AM Please see Amion for pager number during day hours 7:00am-4:30pm

## 2021-01-08 NOTE — Progress Notes (Signed)
NG tube reattached to LWIS.

## 2021-01-08 NOTE — H&P (Signed)
History and Physical    HAILEIGH PITZ TIW:[id number] DOB: 1960/11/11 DOA: 01/08/2021  PCP: Denita Lung, MD  Patient coming from: Home  Chief Complaint: abdominal pain  HPI: BAILYN SPACKMAN is a 60 y.o. female with medical history significant of Crohns, HTN, recurrent SBO. Presenting with abdominal pain, N/V. She has a recent hospitalization in February 2022. 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 She was able to recover with NGT decompression and bowel rest. She states that a few days after discharge she had an epigastric pain that felt like a sharp rock and then turned into "rumbling and gurgling". She spoke with her GI doc (Dr. Fuller Plan). She was given prednisone, stelara, hyoscyamine, and zofran. It help initially, but then no longer provided relief. She states for the last week or so, she had just suffered with the pain. She had been unable to keep anything down. She decided that she could not handle any more, so she came to the ED. She denies any other aggravating or alleviating factors.  ED Course: CT showed high-grade stricture and SBO. She was started on SBO protocol. General surgery was consulted. [facility] was called for admission.   Review of Systems:  Denies CP, palpitations, dyspnea, hematochezia, hematemesis, diarrhea, fever.  Review of systems is otherwise negative for all not mentioned in HPI.   PMHx Past Medical History:  Diagnosis Date  . Anemia 2012  . Anxiety   . Bronchitis   . Crohn's ileitis ([place])   . Depression   . GERD (gastroesophageal reflux disease)   . Headache(784.0)    MIGRAINE  . Heart murmur   . Hiatal hernia   . Hypertension   . IBS (irritable bowel syndrome)   . Incontinence   . MVP (mitral valve prolapse)   . RLS (restless legs syndrome)   . Tubular adenoma of colon 08/2019    PSHx Past Surgical History:  Procedure Laterality Date  . ABDOMINAL HYSTERECTOMY  06/17/2011   Procedure:  HYSTERECTOMY ABDOMINAL;  Surgeon: Arloa Koh;  Location: [facility];  Service: Gynecology;  Laterality: N/A;  Converted to Abdominal Hysterectomy with lysis of adhesions   . ANTERIOR AND POSTERIOR REPAIR  02/25/2012   Procedure: ANTERIOR (CYSTOCELE) AND POSTERIOR REPAIR (RECTOCELE);  Surgeon: Daria Pastures, MD;  Location: [facility];  Service: Gynecology;  Laterality: N/A;  ANterior cystocele repair ONLY  . BIOPSY  08/29/2019   Procedure: BIOPSY;  Surgeon: Lavena Bullion, DO;  Location: [facility];  Service: Gastroenterology;;  . BLADDER SUSPENSION  02/25/2012   Procedure: TRANSVAGINAL TAPE (TVT) PROCEDURE;  Surgeon: Daria Pastures, MD;  Location: [facility];  Service: Gynecology;  Laterality: N/A;  . COLONOSCOPY WITH PROPOFOL N/A 08/29/2019   Procedure: COLONOSCOPY WITH PROPOFOL;  Surgeon: Lavena Bullion, DO;  Location: [facility];  Service: Gastroenterology;  Laterality: N/A;  . CYSTOSCOPY  02/25/2012   Procedure: CYSTOSCOPY;  Surgeon: Daria Pastures, MD;  Location: [facility];  Service: Gynecology;  Laterality: N/A;  . KNEE ARTHROSCOPY  2010  . LAPAROSCOPIC ASSISTED VAGINAL HYSTERECTOMY  06/17/2011   Procedure: LAPAROSCOPIC ASSISTED VAGINAL HYSTERECTOMY;  Surgeon: Arloa Koh;  Location: [facility];  Service: Gynecology;  Laterality: N/A;  Attempted   . POLYPECTOMY  08/29/2019   Procedure: POLYPECTOMY;  Surgeon: Lavena Bullion, DO;  Location: [facility];  Service: Gastroenterology;;  . SALPINGOOPHORECTOMY  06/17/2011   Procedure: SALPINGO OOPHERECTOMY;  Surgeon: Arloa Koh;  Location: [facility];  Service: Gynecology;  Laterality: Bilateral;  . TONSILLECTOMY      SocHx  reports that she has been smoking cigarettes. She has been smoking about 0.50 packs per day. She has never used smokeless tobacco. She reports previous alcohol use. She reports current drug use. Drug: Cocaine.  Allergies  Allergen Reactions  . Iodine     REACTION: in eye [facility] Iodine eye drops; pt has no  reactions to CT contrast    FamHx Family History  Problem Relation Age of Onset  . Diabetes Mother   . Dementia Mother   . Clotting disorder Father   . Macular degeneration Father   . Crohn's disease Sister   . Colon cancer Neg Hx   . Esophageal cancer Neg Hx   . Pancreatic cancer Neg Hx   . Stomach cancer Neg Hx   . Liver disease Neg Hx     Prior to Admission medications   Medication Sig Start Date End Date Taking? Authorizing Provider  diphenhydramine-acetaminophen (TYLENOL PM) 25-500 MG TABS tablet Take 2 tablets by mouth at bedtime as needed (sleep/pain).   Yes [provider]  esomeprazole (NEXIUM) 40 MG capsule TAKE ONE (1) CAPSULE BY MOUTH 2 TIMES DAILY Patient taking differently: Take 40 mg by mouth 2 (two) times daily before a meal. 07/03/20  Yes Kc, Maren Beach, MD  hyoscyamine (LEVSIN) 0.125 MG tablet Take 2 tablets (0.25 mg total) by mouth every 4 (four) hours as needed for cramping. 12/25/20  Yes [name], MD  losartan-hydrochlorothiazide (HYZAAR) 100-12.5 MG tablet Take 1 tablet by mouth daily. 08/03/20  Yes Denita Lung, MD  ondansetron (ZOFRAN-ODT) 4 MG disintegrating tablet Take 1 tablet (4 mg total) by mouth every 8 (eight) hours as needed for nausea or vomiting. 12/25/20  Yes [name], MD  phenylephrine (NEO-SYNEPHRINE) 1 % nasal spray Place 1 drop into both nostrils every 6 (six) hours as needed for congestion. 12/09/20  Yes Cristal Deer, MD  pramipexole (MIRAPEX) 1 MG tablet Take 1 tablet by mouth daily Patient taking differently: Take 1 mg by mouth daily. 03/26/20  Yes Denita Lung, MD  predniSONE (DELTASONE) 10 MG tablet Take 40 mg (4 tablets) by mouth daily x 2 weeks, then reduce to 35 mg (3.5 tablets) by mouth daily x 1 week, then reduce to 30 mg (3 tablets) by mouth daily x 1 week, then reduce to 25 mg (2.5 tablets) by mouth daily x 1 week, then reduce to 20 mg (2 tablets) by mouth daily x 1 week, then reduce to 15 mg (1.5 tablets) by  mouth daily x 1 week, then reduce to 10 mg (1 tablet) daily x 1 week, then reduce to 5 mg (0.5 tablet) daily x 2 weeks, then stop. 12/25/20  Yes [name], MD  venlafaxine XR (EFFEXOR-XR) 150 MG 24 hr capsule TAKE 2 CAPSULES BY MOUTH EVERY DAY Patient taking differently: Take 300 mg by mouth daily. 07/26/20  Yes Denita Lung, MD    Physical Exam: Vitals:   01/08/21 0900 01/08/21 0915 01/08/21 0930 01/08/21 1000  BP: 110/89  110/87 (!) 123/108  Pulse: (!) 101 97 96 96  Resp: 15 13 17 15   Temp:      TempSrc:      SpO2: 96% 97% 98% 98%    General: 60 y.o. female resting in bed in NAD Eyes: PERRL, normal sclera ENMT: Nares patent w/o discharge, orophaynx clear, dentition normal, ears w/o discharge/lesions/ulcers Neck: Supple, trachea midline Cardiovascular: RRR, +S1, S2,  no m/g/r, equal pulses throughout Respiratory: CTABL, no w/r/r, normal WOB GI: BS+, ND, mild epigastric TTP, no masses noted, no organomegaly noted, NGT in place MSK: No e/c/c Skin: No rashes, bruises, ulcerations noted Neuro: A&O x 3, no focal deficits Psyc: tearful, but cooperative  Labs on Admission: I have personally reviewed following labs and imaging studies  CBC: Recent Labs  Lab 01/08/21 0618  WBC 13.7*  HGB 14.3  HCT 43.4  MCV 87.5  PLT 774   Basic Metabolic Panel: Recent Labs  Lab 01/08/21 0618  NA 132*  K 2.4*  CL 95*  CO2 22  GLUCOSE 135*  BUN 24*  CREATININE 1.00  CALCIUM 7.8*  MG 2.1   GFR: CrCl cannot be calculated (Unknown ideal weight.). Liver Function Tests: Recent Labs  Lab 01/08/21 0618  AST 23  ALT 22  ALKPHOS 56  BILITOT 1.8*  PROT 5.7*  ALBUMIN 2.8*   Recent Labs  Lab 01/08/21 0618  LIPASE 22   No results for input(s): AMMONIA in the last 168 hours. Coagulation Profile: No results for input(s): INR, PROTIME in the last 168 hours. Cardiac Enzymes: No results for input(s): CKTOTAL, CKMB, CKMBINDEX, TROPONINI in the last 168 hours. BNP (last 3  results) No results for input(s): PROBNP in the last 8760 hours. HbA1C: No results for input(s): HGBA1C in the last 72 hours. CBG: No results for input(s): GLUCAP in the last 168 hours. Lipid Profile: No results for input(s): CHOL, HDL, LDLCALC, TRIG, CHOLHDL, LDLDIRECT in the last 72 hours. Thyroid Function Tests: No results for input(s): TSH, T4TOTAL, FREET4, T3FREE, THYROIDAB in the last 72 hours. Anemia Panel: No results for input(s): VITAMINB12, FOLATE, FERRITIN, TIBC, IRON, RETICCTPCT in the last 72 hours. Urine analysis:    Component Value Date/Time   COLORURINE AMBER (A) 12/04/2020 1727   APPEARANCEUR CLOUDY (A) 12/04/2020 1727   LABSPEC 1,025 12/18/2020 1644   PHURINE 5.0 12/04/2020 1727   GLUCOSEU NEGATIVE 12/04/2020 1727   HGBUR NEGATIVE 12/04/2020 1727   BILIRUBINUR small (A) 12/18/2020 1644   BILIRUBINUR n 12/04/2011 1014   KETONESUR negative 12/18/2020 1644   KETONESUR 5 (A) 12/04/2020 1727   PROTEINUR =30 (A) 12/18/2020 1644   PROTEINUR 30 (A) 12/04/2020 1727   UROBILINOGEN negative 12/04/2011 1014   NITRITE Negative 12/18/2020 1644   NITRITE POSITIVE (A) 12/04/2020 1727   LEUKOCYTESUR Negative 12/18/2020 1644   LEUKOCYTESUR LARGE (A) 12/04/2020 1727    Radiological Exams on Admission: CT Abdomen Pelvis W Contrast  Result Date: 01/08/2021 CLINICAL DATA:  Periumbilical abdominal pain. History of Crohn's disease. EXAM: CT ABDOMEN AND PELVIS WITH CONTRAST TECHNIQUE: Multidetector CT imaging of the abdomen and pelvis was performed using the standard protocol following bolus administration of intravenous contrast. CONTRAST:  110m OMNIPAQUE IOHEXOL 300 MG/ML  SOLN COMPARISON:  12/04/2020 FINDINGS: Lower chest: The lung bases are clear of acute process. No pleural effusion or pulmonary lesions. The heart is normal in size. No pericardial effusion. The distal esophagus and aorta are unremarkable. Hepatobiliary: No hepatic lesions or intrahepatic biliary dilatation. No  common bile duct dilatation. Pancreas: Moderate pancreatic atrophy but no mass or inflammation. Spleen: Normal size.  No focal lesions. Adrenals/Urinary Tract: The adrenal glands and kidneys are unremarkable. The bladder is unremarkable. Stomach/Bowel: The stomach is unremarkable. The duodenum is unremarkable. High-grade small-bowel obstruction with markedly dilated fluid-filled loops of small bowel with air-fluid levels extending down into the pelvis. Tight stricture or adhesive process in the mid to lower central pelvis as demonstrated on the  prior CT scan likely related to the patient's Crohn's disease. 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 Some associated mucosal enhancement could be local inflammatory process or possible active Crohn's disease. No definite abscess. Second area of probable adhesive change the mid small bowel with definite caliber change centrally at the level of the iliac crest. The colon is relatively decompressed. Vascular/Lymphatic: The aorta is normal in caliber. No dissection. The branch vessels are patent. The major venous structures are patent. No mesenteric or retroperitoneal mass or adenopathy. Small scattered lymph nodes are noted. Reproductive: Surgically absent. Other: No free air or free fluid collections to suggest perforation. Musculoskeletal: No significant bony findings. IMPRESSION: 1. High-grade distal small-bowel obstruction likely due to stricture or adhesive process in the mid to lower central pelvis as demonstrated on the prior CT scan. 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 Second area of probable adhesive change in the mid small bowel with definite caliber change centrally at the level of the iliac crest. 2. No free air or free fluid collections to suggest perforation. Electronically Signed   By: Marijo Sanes M.D.   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     - order fractionated bili  DVT prophylaxis: SCDs  Code Status: FULL  Family Communication: None at bedside  Consults called: General Surgery, [facility]  Status is: Inpatient  Remains inpatient appropriate because:Inpatient level of care appropriate due to severity of illness   Dispo: The patient is from: Home              Anticipated d/c is to: Home              Patient currently is not medically stable to d/c.   Difficult to place patient No  Jonnie Finner DO [facility]  If 7PM-7AM, please contact night-coverage [email or web address]  01/08/2021, 10:30 AM

## 2021-01-08 NOTE — ED Notes (Signed)
Per xray position verification for NG tube "Advise advancing nasogastric tube approximately 8-10 cm".   Done by this RN.

## 2021-01-08 NOTE — ED Notes (Signed)
Patient transported to CT 

## 2021-01-08 NOTE — ED Triage Notes (Addendum)
Pt arrives EMS from home with c/o sudden sharp umbilical abdominal pain starting around 0430. Pt sts numerous episodes of vomiting. Hc Chron's. EMS administered 166mg Fentanyl and 426mzofran and 1000cc NS.

## 2021-01-08 NOTE — ED Provider Notes (Signed)
[facility] DEPT Provider Note   CSN: [id number] Arrival date & time: 01/08/21  0555     History Chief Complaint  Patient presents with  . Abdominal Pain    Cynthia MURPHEY is a 60 y.o. female with medical history significant for fistulizing Crohn's disease, hypertension, depression anxiety that presents emergency department today for abdominal pain.abdominal pain is sharp in nature, comes and goes and is in her upper abdomen, does not radiate anywhere.  Patient follows with GI, Dr. 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  Patient states that her symptoms resolved, however returned about February 14 when she last received her Stelara infusion.  She states that since then for the past 2 weeks she has been having obstructive-like symptoms again, feels as if she is  obstructed.  States that she has not been able to tolerate p.o., has not had a meal in over a week.  Patient states that she threw up her medications yesterday.  Has continuous nausea and vomiting.  Nonbloody.  No diarrhea.  States that she has not had a bowel movement in over a week.  States that she has been compliant with her steroids.  States that she did feel nauseous, however EMS did administer Zofran and 100 mg of fentanyl, states that pain did improve with fentanyl, does not feel nauseous anymore.  Denies any other symptoms.  No fevers or chills.  No chest pain or shortness of breath.  No urinary symptoms.   HPI     Past Medical History:  Diagnosis Date  . Anemia 2012  . Anxiety   . Bronchitis   . Crohn's ileitis ([place])   . Depression   . GERD (gastroesophageal reflux disease)   . Headache(784.0)    MIGRAINE  . Heart murmur   . Hiatal hernia   .  Hypertension   . IBS (irritable bowel syndrome)   . Incontinence   . MVP (mitral valve prolapse)   . RLS (restless legs syndrome)   . Tubular adenoma of colon 08/2019    Patient Active Problem List   Diagnosis Date Noted  . Small bowel obstruction ([place]) 12/04/2020  . Hypokalemia 12/04/2020  . Cough 11/27/2020  . Otalgia of both ears 11/27/2020  . Body aches 11/27/2020  . Crohn's disease with complication ([place]) 59/74/1638  . Fever 11/27/2020  . High risk medication use 11/27/2020  . Crohn disease ([place]) 06/27/2020  . Crohn's ileitis, with intestinal obstruction ([place]) 10/05/2019  . Colitis   . Rectal polyp   . SBO (small bowel obstruction) ([place]) 08/26/2019  . 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  . RLS (restless legs syndrome) 09/25/2014  . Current smoker 09/19/2011  . Migraine headache 09/19/2011  . Hypertension 09/19/2011  . Obesity (BMI 30-39.9) 09/19/2011  . GERD 03/17/2008  . MVP (mitral valve prolapse) 03/17/2008  . HIATAL HERNIA 05/03/2002    Past Surgical History:  Procedure Laterality Date  . ABDOMINAL HYSTERECTOMY  06/17/2011   Procedure: HYSTERECTOMY ABDOMINAL;  Surgeon: Arloa Koh;  Location: [facility];  Service: Gynecology;  Laterality: N/A;  Converted to Abdominal Hysterectomy with lysis of adhesions   . ANTERIOR AND POSTERIOR REPAIR  02/25/2012   Procedure: ANTERIOR (CYSTOCELE) AND POSTERIOR REPAIR (RECTOCELE);  Surgeon: Daria Pastures, MD;  Location: [facility];  Service:  Gynecology;  Laterality: N/A;  ANterior cystocele repair ONLY  . BIOPSY  08/29/2019   Procedure: BIOPSY;  Surgeon: Lavena Bullion, DO;  Location: [facility];  Service: Gastroenterology;;  . BLADDER SUSPENSION  02/25/2012   Procedure: TRANSVAGINAL TAPE (TVT) PROCEDURE;  Surgeon: Daria Pastures, MD;  Location: [facility];  Service: Gynecology;  Laterality: N/A;  . COLONOSCOPY WITH PROPOFOL N/A 08/29/2019   Procedure: COLONOSCOPY WITH PROPOFOL;  Surgeon: Lavena Bullion, DO;  Location: [facility];  Service: Gastroenterology;  Laterality: N/A;  . CYSTOSCOPY  02/25/2012   Procedure: CYSTOSCOPY;  Surgeon: Daria Pastures, MD;  Location: [facility];  Service: Gynecology;  Laterality: N/A;  . KNEE ARTHROSCOPY  2010  . LAPAROSCOPIC ASSISTED VAGINAL HYSTERECTOMY  06/17/2011   Procedure: LAPAROSCOPIC ASSISTED VAGINAL HYSTERECTOMY;  Surgeon: Arloa Koh;  Location: [facility];  Service: Gynecology;  Laterality: N/A;  Attempted   . POLYPECTOMY  08/29/2019   Procedure: POLYPECTOMY;  Surgeon: Lavena Bullion, DO;  Location: [facility];  Service: Gastroenterology;;  . SALPINGOOPHORECTOMY  06/17/2011   Procedure: SALPINGO OOPHERECTOMY;  Surgeon: Arloa Koh;  Location: [facility];  Service: Gynecology;  Laterality: Bilateral;  . TONSILLECTOMY       OB History   No obstetric history on file.     Family History  Problem Relation Age of Onset  . Diabetes Mother   . Dementia Mother   . Clotting disorder Father   . Macular degeneration Father   . Crohn's disease Sister   . Colon cancer Neg Hx   . Esophageal cancer Neg Hx   . Pancreatic cancer Neg Hx   . Stomach cancer Neg Hx   . Liver disease Neg Hx     Social History   Tobacco Use  . Smoking status: Current Every Day Smoker    Packs/day: 0.50    Types: Cigarettes  . Smokeless tobacco: Never Used  . Tobacco comment: none in 2 weeks as of 12/04/20  Vaping Use  . Vaping Use: Former  Substance Use Topics  . Alcohol use: Not Currently  . Drug use: Yes    Types: Cocaine    Comment: socially; d/c 3 weeks ago per patient 05/31/20    Home Medications Prior to Admission medications   Medication Sig Start Date End Date Taking? Authorizing Provider  diphenhydramine-acetaminophen (TYLENOL PM) 25-500 MG TABS tablet Take 2 tablets by mouth at bedtime as needed (sleep/pain).   Yes [provider]  esomeprazole (NEXIUM) 40 MG capsule TAKE ONE (1) CAPSULE BY MOUTH 2 TIMES DAILY Patient taking differently: Take 40 mg by mouth 2  (two) times daily before a meal. 07/03/20  Yes Kc, Maren Beach, MD  hyoscyamine (LEVSIN) 0.125 MG tablet Take 2 tablets (0.25 mg total) by mouth every 4 (four) hours as needed for cramping. 12/25/20  Yes [name], MD  losartan-hydrochlorothiazide (HYZAAR) 100-12.5 MG tablet Take 1 tablet by mouth daily. 08/03/20  Yes Denita Lung, MD  ondansetron (ZOFRAN-ODT) 4 MG disintegrating tablet Take 1 tablet (4 mg total) by mouth every 8 (eight) hours as needed for nausea or vomiting. 12/25/20  Yes [name], MD  phenylephrine (NEO-SYNEPHRINE) 1 % nasal spray Place 1 drop into both nostrils every 6 (six) hours as needed for congestion. 12/09/20  Yes Cristal Deer, MD  pramipexole (MIRAPEX) 1 MG tablet Take 1 tablet by mouth daily Patient taking differently: Take 1 mg by mouth daily. 03/26/20  Yes Denita Lung, MD  predniSONE (DELTASONE) 10 MG tablet  Take 40 mg (4 tablets) by mouth daily x 2 weeks, then reduce to 35 mg (3.5 tablets) by mouth daily x 1 week, then reduce to 30 mg (3 tablets) by mouth daily x 1 week, then reduce to 25 mg (2.5 tablets) by mouth daily x 1 week, then reduce to 20 mg (2 tablets) by mouth daily x 1 week, then reduce to 15 mg (1.5 tablets) by mouth daily x 1 week, then reduce to 10 mg (1 tablet) daily x 1 week, then reduce to 5 mg (0.5 tablet) daily x 2 weeks, then stop. 12/25/20  Yes [name], MD  venlafaxine XR (EFFEXOR-XR) 150 MG 24 hr capsule TAKE 2 CAPSULES BY MOUTH EVERY DAY Patient taking differently: Take 300 mg by mouth daily. 07/26/20  Yes Denita Lung, MD    Allergies    Iodine  Review of Systems   Review of Systems  Constitutional: Negative for chills, diaphoresis, fatigue and fever.  HENT: Negative for congestion, sore throat and trouble swallowing.   Eyes: Negative for pain and visual disturbance.  Respiratory: Negative for cough, shortness of breath and wheezing.   Cardiovascular: Negative for chest pain, palpitations and leg swelling.   Gastrointestinal: Positive for abdominal pain, nausea and vomiting. Negative for abdominal distention and diarrhea.  Genitourinary: Negative for difficulty urinating.  Musculoskeletal: Negative for back pain, neck pain and neck stiffness.  Skin: Negative for pallor.  Neurological: Negative for dizziness, speech difficulty, weakness and headaches.  Psychiatric/Behavioral: Negative for confusion.    Physical Exam Updated Vital Signs BP (!) 123/108   Pulse 96   Temp 98 F (36.7 C) (Oral)   Resp 15   LMP 06/10/2011 (Exact Date)   SpO2 98%   Physical Exam Constitutional:      General: She is in acute distress.     Appearance: Normal appearance. She is not ill-appearing, toxic-appearing or diaphoretic.     Comments: Appears uncomfortable in bed  HENT:     Mouth/Throat:     Mouth: Mucous membranes are moist.     Pharynx: Oropharynx is clear.  Eyes:     General: No scleral icterus.    Extraocular Movements: Extraocular movements intact.     Pupils: Pupils are equal, round, and reactive to light.  Cardiovascular:     Rate and Rhythm: Normal rate and regular rhythm.     Pulses: Normal pulses.     Heart sounds: Normal heart sounds.  Pulmonary:     Effort: Pulmonary effort is normal. No respiratory distress.     Breath sounds: Normal breath sounds. No stridor. No wheezing, rhonchi or rales.  Chest:     Chest wall: No tenderness.  Abdominal:     General: Abdomen is flat. There is no distension.     Palpations: Abdomen is soft.     Tenderness: There is abdominal tenderness in the epigastric area. There is guarding. There is no rebound. Negative signs include Murphy's sign, Rovsing's sign, McBurney's sign and psoas sign.  Musculoskeletal:        General: No swelling or tenderness. Normal range of motion.     Cervical back: Normal range of motion and neck supple. No rigidity.     Right lower leg: No edema.     Left lower leg: No edema.  Skin:    General: Skin is warm and dry.      Capillary Refill: Capillary refill takes less than 2 seconds.     Coloration: Skin is not pale.  Neurological:  General: No focal deficit present.     Mental Status: She is alert and oriented to person, place, and time.  Psychiatric:        Mood and Affect: Mood normal.        Behavior: Behavior normal.     ED Results / Procedures / Treatments   Labs (all labs ordered are listed, but only abnormal results are displayed) Labs Reviewed  COMPREHENSIVE METABOLIC PANEL - Abnormal; Notable for the following components:      Result Value   Sodium 132 (*)    Potassium 2.4 (*)    Chloride 95 (*)    Glucose, Bld 135 (*)    BUN 24 (*)    Calcium 7.8 (*)    Total Protein 5.7 (*)    Albumin 2.8 (*)    Total Bilirubin 1.8 (*)    All other components within normal limits  CBC - Abnormal; Notable for the following components:   WBC 13.7 (*)    All other components within normal limits  RESP PANEL BY RT-PCR (FLU A&B, COVID) ARPGX2  LIPASE, BLOOD  MAGNESIUM  URINALYSIS, ROUTINE W REFLEX MICROSCOPIC    EKG None  Radiology CT Abdomen Pelvis W Contrast  Result Date: 01/08/2021 CLINICAL DATA:  Periumbilical abdominal pain. History of Crohn's disease. EXAM: CT ABDOMEN AND PELVIS WITH CONTRAST TECHNIQUE: Multidetector CT imaging of the abdomen and pelvis was performed using the standard protocol following bolus administration of intravenous contrast. CONTRAST:  113m OMNIPAQUE IOHEXOL 300 MG/ML  SOLN COMPARISON:  12/04/2020 FINDINGS: Lower chest: The lung bases are clear of acute process. No pleural effusion or pulmonary lesions. The heart is normal in size. No pericardial effusion. The distal esophagus and aorta are unremarkable. Hepatobiliary: No hepatic lesions or intrahepatic biliary dilatation. No common bile duct dilatation. Pancreas: Moderate pancreatic atrophy but no mass or inflammation. Spleen: Normal size.  No focal lesions. Adrenals/Urinary Tract: The adrenal glands and kidneys are  unremarkable. The bladder is unremarkable. Stomach/Bowel: The stomach is unremarkable. The duodenum is unremarkable. High-grade small-bowel obstruction with markedly dilated fluid-filled loops of small bowel with air-fluid levels extending down into the pelvis. Tight stricture or adhesive process in the mid to lower central pelvis as demonstrated on the prior CT scan likely related to the patient's Crohn's disease. 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 Some associated mucosal enhancement could be local inflammatory process or possible active Crohn's disease. No definite abscess. Second area of probable adhesive change the mid small bowel with definite caliber change centrally at the level of the iliac crest. The colon is relatively decompressed. Vascular/Lymphatic: The aorta is normal in caliber. No dissection. The branch vessels are patent. The major venous structures are patent. No mesenteric or retroperitoneal mass or adenopathy. Small scattered lymph nodes are noted. Reproductive: Surgically absent. Other: No free air or free fluid collections to suggest perforation. Musculoskeletal: No significant bony findings. IMPRESSION: 1. High-grade distal small-bowel obstruction likely due to stricture or adhesive process in the mid to lower central pelvis as demonstrated on the prior CT scan. 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 Second area of probable adhesive change in the mid small bowel with definite caliber change centrally at the level of the iliac crest. 2. No free air or free fluid collections to suggest perforation. Electronically Signed   By: PMarijo SanesM.D.   On: 01/08/2021  09:15    Procedures Procedures   Medications Ordered in ED Medications  0.9 % NaCl with KCl 20 mEq/ L  infusion (has no administration in time range)  potassium chloride 10 mEq in 100 mL IVPB (0 mEq  Intravenous Stopped 01/08/21 0919)  sodium chloride 0.9 % bolus 1,000 mL (0 mLs Intravenous Stopped 01/08/21 1028)  fentaNYL (SUBLIMAZE) injection 50 mcg (50 mcg Intravenous Given 01/08/21 0747)  iohexol (OMNIPAQUE) 300 MG/ML solution 100 mL (100 mLs Intravenous Contrast Given 01/08/21 0801)  HYDROmorphone (DILAUDID) injection 1 mg (1 mg Intravenous Given 01/08/21 1034)    ED Course  I have reviewed the triage vital signs and the nursing notes.  Pertinent labs & imaging results that were available during my care of the patient were reviewed by me and considered in my medical decision making (see chart for details).    MDM Rules/Calculators/A&P                          JEREMIAH TARPLEY is a 60 y.o. female with medical history significant for fistulizing Crohn's disease, hypertension, depression anxiety that presents emergency department today for abdominal pain, feels like her previous bowel obstruction that she was admitted for.  Blood pressure 99/75, will give 50 mg of fentanyl.  Patient is stable, however is in pain.  Will give IV fluids and pain medication.  Potassium 2.4, will replete this here.  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 Hassell Done who will consult.  Recommends NG tube in hospital submission.  On repeat evaluation, patient still having pain, will give Dilaudid at this time.  Still not nauseous. Pt agreeable with plan.   Spoke to Dr. [name], [facility] hospitalist who accept the patient.  The patient appears reasonably stabilized for admission considering the current resources, flow, and capabilities available in the ED at this time, and I doubt any other [facility] requiring further screening and/or treatment in the ED prior to admission.  Final Clinical Impression(s) / ED Diagnoses Final diagnoses:  Encounter for imaging study to confirm nasogastric (NG) tube placement  Small bowel obstruction ([place])  Hypokalemia    Rx / DC Orders ED  Discharge Orders    None       Alfredia Client, PA-C 01/08/21 [place], MD 01/09/21 [phone]

## 2021-01-08 NOTE — Progress Notes (Addendum)
Gastrograffin administered. Will leave NG tube clamped for 1 hour per medication admin instructions. Pt tolerated well. Will continue to monitor.

## 2021-01-08 NOTE — Plan of Care (Signed)

## 2021-01-08 NOTE — Consult Note (Addendum)
Cynthia [name]: 3:24 PM 01/08/2021  LOS: 0 days    Referring Provider: Dr [name]  Primary Care Physician:  Denita Lung, MD Primary Gastroenterologist:  Dr. Fuller Plan     Reason for Consultation: SBO in patient with Crohn's disease.   HPI: Cynthia [name] is a 60 y.o. female.  GERD.  IBS.  Restless leg syndrome.  Previous hysterectomy. 04/2008 colonoscopy to assess diarrhea, bloating.  Study normal. 07/2011 colonoscopy to assess abnormal cecum noted during hysterectomy.  5 mm sessile polyp (serrated adenoma) removed from splenic flexure, otherwise normal study. 08/2019 Colonoscopy to assess abdominal pain, diarrhea and CT showing inflammation in the ileum.  Dr. Bryan Lemma noted significant looping in the colon.  Removed 2 small polyps at the rectum and rectosigmoid.  Biopsy obtained of decreased vascular pattern at the mucosa in the descending, transverse, a sending, cecum.  Ileitis with appearance suspicious for Crohn's. Pathology in ileumconfirmed granulation tissue and exudate consistent with ulcer, differential includes Crohn's disease in the ileum.  Rectal polyp was tubular adenoma without high-grade dysplasia.  Other biopsies throughout the colon showed no significant pathology.  Diagnosed with Crohn's ileocolitis in 08/2019.  Had had GI symptoms going back several years by the time she was diagnosed.  Initially treated with Humira, switched to [place] in late 2021. SBO 06/2020 and again in early February 2022. Admission in early February 2022 with SBO.  Treated medically with NG tube, IV steroids.   CTAP of 12/04/2020 revealed high-grade obstruction at distal small bowel with transition point at distal ileum where there is mild thickening of bowel loops and severe luminal narrowing consistent with stricture.   Transition point within the pelvis and tethering of adjacent small bowel loops in that area consistent with adhesions.  Not able to exclude entero-enteric fistula. At discharge from early February she was to continue Cipro/Flagyl for tx of UTI for a total of 7 days, complete prednisone taper over the next several weeks starting at 40 mg.  Continue Stelara, next dose was due 2/14. At 12/25/2020 office follow-up with Dr. Fuller Plan she was having postprandial abdominal distention, crampy abdominal pain, frequent nausea and decreased p.o. intake consisting mostly of soups and boost.  No diarrhea or vomiting.  For the past 4 days patient has hardly been able to keep anything down other than Jell-O.  Paroxysms of mid to left-sided abdominal pain have become more frequent and prolonged.  Last bowel movement was about 4 days ago.  She presented to the ED because of worsening symptoms.  CTAP with contrast today.  Also shows high-grade SBO at distal small bowel due to stricture or adhesive process.  Findings also suspicious for enter-enteric fistula.  There is a second area of probable adhesive change in the mid small bowel with caliber change.  No free fluid, no fluid collections to suggest perforation. NG tube has been placed, about 200 mL of clear light brown fluid is in the suction canister.  Patient feels better.  Abdominal film shows distal tube side-port rests above the GE junction and suggest advancing the tube  8 to 10 cm.  Persistent small bowel dilatation.  WBCs 13.7.  Hgb 14.3, C/W previous baseline. Na 132, potassium 2.4.  Mag normal at 2.1.  Phos low at 2.4.  BUN elevated at 24 with normal creatinine 1. T bili 1.8 but alk phos, transaminases within normal limits.  Patient sister also has Crohn's disease but a much milder form.    Past Medical History:  Diagnosis Date  . Anemia 2012  . Anxiety   . Bronchitis   . Crohn's ileitis ([place])   . Depression   . GERD (gastroesophageal reflux disease)   .  Headache(784.0)    MIGRAINE  . Heart murmur   . Hiatal hernia   . Hypertension   . IBS (irritable bowel syndrome)   . Incontinence   . MVP (mitral valve prolapse)   . RLS (restless legs syndrome)   . Tubular adenoma of colon 08/2019    Past Surgical History:  Procedure Laterality Date  . ABDOMINAL HYSTERECTOMY  06/17/2011   Procedure: HYSTERECTOMY ABDOMINAL;  Surgeon: Arloa Koh;  Location: [facility];  Service: Gynecology;  Laterality: N/A;  Converted to Abdominal Hysterectomy with lysis of adhesions   . ANTERIOR AND POSTERIOR REPAIR  02/25/2012   Procedure: ANTERIOR (CYSTOCELE) AND POSTERIOR REPAIR (RECTOCELE);  Surgeon: Daria Pastures, MD;  Location: [facility];  Service: Gynecology;  Laterality: N/A;  ANterior cystocele repair ONLY  . BIOPSY  08/29/2019   Procedure: BIOPSY;  Surgeon: Lavena Bullion, DO;  Location: [facility];  Service: Gastroenterology;;  . BLADDER SUSPENSION  02/25/2012   Procedure: TRANSVAGINAL TAPE (TVT) PROCEDURE;  Surgeon: Daria Pastures, MD;  Location: [facility];  Service: Gynecology;  Laterality: N/A;  . COLONOSCOPY WITH PROPOFOL N/A 08/29/2019   Procedure: COLONOSCOPY WITH PROPOFOL;  Surgeon: Lavena Bullion, DO;  Location: [facility];  Service: Gastroenterology;  Laterality: N/A;  . CYSTOSCOPY  02/25/2012   Procedure: CYSTOSCOPY;  Surgeon: Daria Pastures, MD;  Location: [facility];  Service: Gynecology;  Laterality: N/A;  . KNEE ARTHROSCOPY  2010  . LAPAROSCOPIC ASSISTED VAGINAL HYSTERECTOMY  06/17/2011   Procedure: LAPAROSCOPIC ASSISTED VAGINAL HYSTERECTOMY;  Surgeon: Arloa Koh;  Location: [facility];  Service: Gynecology;  Laterality: N/A;  Attempted   . POLYPECTOMY  08/29/2019   Procedure: POLYPECTOMY;  Surgeon: Lavena Bullion, DO;  Location: [facility];  Service: Gastroenterology;;  . SALPINGOOPHORECTOMY  06/17/2011   Procedure: SALPINGO OOPHERECTOMY;  Surgeon: Arloa Koh;  Location: [facility];  Service: Gynecology;  Laterality: Bilateral;   . TONSILLECTOMY      Prior to Admission medications   Medication Sig Start Date End Date Taking? Authorizing Provider  diphenhydramine-acetaminophen (TYLENOL PM) 25-500 MG TABS tablet Take 2 tablets by mouth at bedtime as needed (sleep/pain).   Yes [provider]  esomeprazole (NEXIUM) 40 MG capsule TAKE ONE (1) CAPSULE BY MOUTH 2 TIMES DAILY Patient taking differently: Take 40 mg by mouth 2 (two) times daily before a meal. 07/03/20  Yes Kc, Maren Beach, MD  hyoscyamine (LEVSIN) 0.125 MG tablet Take 2 tablets (0.25 mg total) by mouth every 4 (four) hours as needed for cramping. 12/25/20  Yes [name], MD  losartan-hydrochlorothiazide (HYZAAR) 100-12.5 MG tablet Take 1 tablet by mouth daily. 08/03/20  Yes Denita Lung, MD  ondansetron (ZOFRAN-ODT) 4 MG disintegrating tablet Take 1 tablet (4 mg total) by mouth every 8 (eight) hours as needed for nausea or vomiting. 12/25/20  Yes [name], MD  phenylephrine (NEO-SYNEPHRINE) 1 % nasal  spray Place 1 drop into both nostrils every 6 (six) hours as needed for congestion. 12/09/20  Yes Cristal Deer, MD  pramipexole (MIRAPEX) 1 MG tablet Take 1 tablet by mouth daily Patient taking differently: Take 1 mg by mouth daily. 03/26/20  Yes Denita Lung, MD  predniSONE (DELTASONE) 10 MG tablet Take 40 mg (4 tablets) by mouth daily x 2 weeks, then reduce to 35 mg (3.5 tablets) by mouth daily x 1 week, then reduce to 30 mg (3 tablets) by mouth daily x 1 week, then reduce to 25 mg (2.5 tablets) by mouth daily x 1 week, then reduce to 20 mg (2 tablets) by mouth daily x 1 week, then reduce to 15 mg (1.5 tablets) by mouth daily x 1 week, then reduce to 10 mg (1 tablet) daily x 1 week, then reduce to 5 mg (0.5 tablet) daily x 2 weeks, then stop. 12/25/20  Yes [name], MD  venlafaxine XR (EFFEXOR-XR) 150 MG 24 hr capsule TAKE 2 CAPSULES BY MOUTH EVERY DAY Patient taking differently: Take 300 mg by mouth daily. 07/26/20  Yes Denita Lung, MD     Scheduled Meds:  Infusions: . sodium chloride    . potassium chloride 10 mEq (01/08/21 1506)   PRN Meds: acetaminophen **OR** acetaminophen, fentaNYL (SUBLIMAZE) injection, LORazepam, ondansetron **OR** ondansetron (ZOFRAN) IV, phenylephrine   Allergies as of 01/08/2021 - Review Complete 01/08/2021  Allergen Reaction Noted  . Iodine  03/17/2008    Family History  Problem Relation Age of Onset  . Diabetes Mother   . Dementia Mother   . Clotting disorder Father   . Macular degeneration Father   . Crohn's disease Sister   . Colon cancer Neg Hx   . Esophageal cancer Neg Hx   . Pancreatic cancer Neg Hx   . Stomach cancer Neg Hx   . Liver disease Neg Hx     Social History   Socioeconomic History  . Marital status: Widowed    Spouse name: Not on file  . Number of children: 1  . Years of education: Not on file  . Highest education level: Not on file  Occupational History  . Occupation: Designer, television/film set: [employer]  Tobacco Use  . Smoking status: Current Every Day Smoker    Packs/day: 0.50    Types: Cigarettes  . Smokeless tobacco: Never Used  . Tobacco comment: none in 2 weeks as of 12/04/20  Vaping Use  . Vaping Use: Former  Substance and Sexual Activity  . Alcohol use: Not Currently  . Drug use: Yes    Types: Cocaine    Comment: socially; d/c 3 weeks ago per patient 05/31/20  . Sexual activity: Not Currently  Other Topics Concern  . Not on file  Social History Narrative   Lives with boyfriend who she states is an alcoholic   Social Determinants of Radio broadcast assistant Strain: Not on file  Food Insecurity: Not on file  Transportation Needs: Not on file  Physical Activity: Not on file  Stress: Not on file  Social Connections: Not on file  Intimate Partner Violence: Not on file    REVIEW OF SYSTEMS: Constitutional: Feels exhausted. ENT:  No nose bleeds Pulm: No shortness of breath or cough CV:  No palpitations, no LE edema.  GU:  No  hematuria, no frequency GI: See HPI Heme: No unusual bleeding or bruising. Transfusions: None. Neuro:  No headaches, no peripheral tingling or numbness.  No  syncope, no seizures, no dizziness. Derm:  No itching, no rash or sores.  Endocrine:  No sweats or chills.  No polyuria or dysuria Immunization: Vaccination history reviewed.  Shoes received Pfizer COVID-19 vaccine and booster.   PHYSICAL EXAM: Vital signs in last 24 hours: Vitals:   01/08/21 1130 01/08/21 1211  BP: (!) 118/97 (!) 127/101  Pulse: (!) 102 (!) 110  Resp: 13 16  Temp: 97.6 F (36.4 C) 97.8 F (36.6 C)  SpO2: 98% 100%   Wt Readings from Last 3 Encounters:  01/08/21 60.9 kg  12/25/20 61.5 kg  12/18/20 67 kg    General: Patient looks uncomfortable, ill.  NG tube in place. Head: No facial asymmetry or swelling.  No signs of head trauma. Eyes: Scleral icterus or conjunctival pallor. Ears: No hearing deficit Nose: No congestion or discharge Mouth: Mucosa is dry but clear.  Tongue midline. Neck: No JVD, no masses, no thyromegaly Lungs: No labored breathing, no cough.  Lungs clear bilaterally Heart: RRR.  No MRG.  S1, S2 present Abdomen: Soft, not protuberant.  Hyperactive bowel sounds but no tinkling or high-pitched sounds.  Tenderness at the mid abdomen towards the left side without guarding or rebound.  No masses, HSM, bruits, hernias..   Rectal: Deferred Musc/Skeltl: No joint redness, swelling or gross deformity. Extremities: No CCE. Neurologic: Alert.  Oriented x3.  Moves all 4 limbs.  No tremors or gross weakness though strength not tested. Skin: No rash, no sores, no telangiectasia. Nodes: No cervical adenopathy Psych: Calm, cooperative, pleasant.  Intake/Output from previous day: No intake/output data recorded. Intake/Output this shift: Total I/O In: 1099.4 [IV Piggyback:1099.4] Out: 0   LAB RESULTS: Recent Labs    01/08/21 0618  WBC 13.7*  HGB 14.3  HCT 43.4  PLT 329   BMET Lab Results   Component Value Date   NA 132 (L) 01/08/2021   NA 140 12/18/2020   NA 131 (L) 12/08/2020   K 2.4 (LL) 01/08/2021   K 4.2 12/18/2020   K 4.1 12/08/2020   CL 95 (L) 01/08/2021   CL 101 12/18/2020   CL 103 12/08/2020   CO2 22 01/08/2021   CO2 25 12/18/2020   CO2 21 (L) 12/08/2020   GLUCOSE 135 (H) 01/08/2021   GLUCOSE 82 12/18/2020   GLUCOSE 110 (H) 12/08/2020   BUN 24 (H) 01/08/2021   BUN 11 12/18/2020   BUN 7 (L) 12/08/2020   CREATININE 1.00 01/08/2021   CREATININE 0.93 12/18/2020   CREATININE 0.69 12/08/2020   CALCIUM 7.8 (L) 01/08/2021   CALCIUM 9.6 12/18/2020   CALCIUM 8.3 (L) 12/08/2020   LFT Recent Labs    01/08/21 0618  PROT 5.7*  ALBUMIN 2.8*  AST 23  ALT 22  ALKPHOS 56  BILITOT 1.7*  1.8*  BILIDIR 0.3*  IBILI 1.4*   PT/INR No results found for: INR, PROTIME Hepatitis Panel No results for input(s): HEPBSAG, HCVAB, HEPAIGM, HEPBIGM in the last 72 hours. C-Diff No components found for: CDIFF Lipase     Component Value Date/Time   LIPASE 22 01/08/2021 0618      RADIOLOGY STUDIES: CT Abdomen Pelvis W Contrast  Result Date: 01/08/2021 CLINICAL DATA:  Periumbilical abdominal pain. History of Crohn's disease. EXAM: CT ABDOMEN AND PELVIS WITH CONTRAST TECHNIQUE: Multidetector CT imaging of the abdomen and pelvis was performed using the standard protocol following bolus administration of intravenous contrast. CONTRAST:  12m OMNIPAQUE IOHEXOL 300 MG/ML  SOLN COMPARISON:  12/04/2020 FINDINGS: Lower chest: The lung bases are  clear of acute process. No pleural effusion or pulmonary lesions. The heart is normal in size. No pericardial effusion. The distal esophagus and aorta are unremarkable. Hepatobiliary: No hepatic lesions or intrahepatic biliary dilatation. No common bile duct dilatation. Pancreas: Moderate pancreatic atrophy but no mass or inflammation. Spleen: Normal size.  No focal lesions. Adrenals/Urinary Tract: The adrenal glands and kidneys are  unremarkable. The bladder is unremarkable. Stomach/Bowel: The stomach is unremarkable. The duodenum is unremarkable. High-grade small-bowel obstruction with markedly dilated fluid-filled loops of small bowel with air-fluid levels extending down into the pelvis. Tight stricture or adhesive process in the mid to lower central pelvis as demonstrated on the prior CT scan likely related to the patient's Crohn's disease. 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 Some associated mucosal enhancement could be local inflammatory process or possible active Crohn's disease. No definite abscess. Second area of probable adhesive change the mid small bowel with definite caliber change centrally at the level of the iliac crest. The colon is relatively decompressed. Vascular/Lymphatic: The aorta is normal in caliber. No dissection. The branch vessels are patent. The major venous structures are patent. No mesenteric or retroperitoneal mass or adenopathy. Small scattered lymph nodes are noted. Reproductive: Surgically absent. Other: No free air or free fluid collections to suggest perforation. Musculoskeletal: No significant bony findings. IMPRESSION: 1. High-grade distal small-bowel obstruction likely due to stricture or adhesive process in the mid to lower central pelvis as demonstrated on the prior CT scan. 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 Second area of probable adhesive change in the mid small bowel with definite caliber change centrally at the level of the iliac crest. 2. No free air or free fluid collections to suggest perforation. Electronically Signed   By: Marijo Sanes M.D.   On: 01/08/2021 09:15   DG Abd Portable 1V-Small Bowel Protocol-Position Verification  Result Date: 01/08/2021 CLINICAL DATA:  Nasogastric tube placement. Crohn's disease. Abdominal pain and nausea EXAM: PORTABLE ABDOMEN - 1 VIEW  COMPARISON:  CT abdomen and pelvis January 08, 2021; abdominal radiograph December 04, 2020 FINDINGS: Nasogastric tube tip is at the level of the proximal gastric cardia. Side port is above the gastroesophageal junction. Multiple loops of dilated bowel again noted. No free air evident on supine examination. Lung bases clear. IMPRESSION: Nasogastric tube tip in proximal gastric cardia. Side port above the gastroesophageal junction. Advise advancing nasogastric tube approximately 8-10 cm. Dilated bowel remains. No free air evident on supine examination. Electronically Signed   By: Lowella Grip III M.D.   On: 01/08/2021 11:18      IMPRESSION:   *    Recurrent SBO either due to stricturing from Crohn's disease versus adhesive disease or both.   Not sure that the Stelara has been effective as she still has evidence of stricture on today's CT scan.  *     Hypokalemia.    PLAN:     *    Agree with surgical [name].  *    ESR and CRP ordered.  Both of these elevated in late July 2021.  *   Add IV Protonix.  ?  Add Solu-Medrol?,  Defer decision to Dr. Silverio Decamp.  *    advance NG tube as suggested by radiology.  *   Added ice chips   Azucena Freed  01/08/2021, 3:24 PM Phone [phone]   Attending  physician's note   I have taken a history, examined the patient and reviewed the chart. I agree with the Advanced Practitioner's note, impression and recommendations.  60 year old female with history of Crohn's disease admitted with recurrent SBO NG tube to suction We will follow-up inflammatory markers She is currently on Stelara for immunosuppressive therapy.  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 We will start Cipro and Flagyl IV and add IV Solu-Medrol Surgery [name], will likely need bowel resection given she is failing conservative management GI will continue to follow  The patient was provided an opportunity to ask questions and all were  answered. The patient agreed with the plan and demonstrated an understanding of the instructions.  Damaris Hippo , MD [phone]

## 2021-01-09 DIAGNOSIS — K5000 Crohn's disease of small intestine without complications: Secondary | ICD-10-CM

## 2021-01-09 DIAGNOSIS — K50812 Crohn's disease of both small and large intestine with intestinal obstruction: Secondary | ICD-10-CM

## 2021-01-09 DIAGNOSIS — Z0189 Encounter for other specified special examinations: Secondary | ICD-10-CM

## 2021-01-09 DIAGNOSIS — K2100 Gastro-esophageal reflux disease with esophagitis, without bleeding: Secondary | ICD-10-CM

## 2021-01-09 LAB — COMPREHENSIVE METABOLIC PANEL
ALT: 22 U/L (ref 0–44)
AST: 20 U/L (ref 15–41)
Albumin: 2.5 g/dL — ABNORMAL LOW (ref 3.5–5.0)
Alkaline Phosphatase: 50 U/L (ref 38–126)
Anion gap: 10 (ref 5–15)
BUN: 20 mg/dL (ref 8–23)
CO2: 23 mmol/L (ref 22–32)
Calcium: 8.2 mg/dL — ABNORMAL LOW (ref 8.9–10.3)
Chloride: 104 mmol/L (ref 98–111)
Creatinine, Ser: 0.76 mg/dL (ref 0.44–1.00)
GFR, Estimated: >60 mL/min (ref >60)
Glucose, Bld: 86 mg/dL (ref 70–99)
Potassium: 4.1 mmol/L (ref 3.5–5.1)
Sodium: 137 mmol/L (ref 135–145)
Total Bilirubin: 1.1 mg/dL (ref 0.3–1.2)
Total Protein: 5.2 g/dL — ABNORMAL LOW (ref 6.5–8.1)

## 2021-01-09 LAB — CBC
HCT: 41.1 % (ref 36.0–46.0)
Hemoglobin: 13.4 g/dL (ref 12.0–15.0)
MCH: 29.3 pg (ref 26.0–34.0)
MCHC: 32.6 g/dL (ref 30.0–36.0)
MCV: 89.9 fL (ref 80.0–100.0)
Platelets: 257 10*3/uL (ref 150–400)
RBC: 4.57 MIL/uL (ref 3.87–5.11)
RDW: 14 % (ref 11.5–15.5)
WBC: 9.3 10*3/uL (ref 4.0–10.5)
nRBC: 0 % (ref 0.0–0.2)

## 2021-01-09 MED ORDER — PANTOPRAZOLE SODIUM 40 MG IV SOLR
40.0000 mg | INTRAVENOUS | Status: DC
  Administered 2021-01-09 – 2021-01-12 (×4): 40 mg via INTRAVENOUS
  Filled 2021-01-09 (×4): qty 40

## 2021-01-09 NOTE — Progress Notes (Signed)
[facility] Progress Note     Subjective: CC:  Ongoing abdominal discomfort desribed as intermittent episodes of upper abdominal discomfort and firmness, followed by "gurgling" and some relief. Repots 2 loose stools. Unsure if she is having flatus  NG with minimal output but was not functioning properly during my exam. I got it working and 200 cc came out within 5-10 minutes.  Objective: Vital signs in last 24 hours: Temp:  [97.6 F (36.4 C)-98.1 F (36.7 C)] 98.1 F (36.7 C) (03/09 0509) Pulse Rate:  [86-110] 97 (03/09 0509) Resp:  [13-20] 16 (03/09 0509) BP: (118-136)/(83-108) 136/90 (03/09 0509) SpO2:  [96 %-100 %] 100 % (03/09 0509) Weight:  [60.9 kg] 60.9 kg (03/08 1241)    Intake/Output from previous day: 03/08 0701 - 03/09 0700 In: 1398.5 [I.V.:88; IV Piggyback:1310.5] Out: 550 [Emesis/NG output:550] Intake/Output this shift: No intake/output data recorded.  PE: Gen:  Alert, NAD, pleasant Card:  Regular rate and rhythm, pedal pulses 2+ BL Pulm:  Normal effort, clear to auscultation bilaterally Abd: Soft, moderate distention with upper abdominal fullness, overall non-tender, tinkering bowel sounds present   Lab Results:  Recent Labs    01/08/21 0618 01/09/21 0512  WBC 13.7* 9.3  HGB 14.3 13.4  HCT 43.4 41.1  PLT 329 257   BMET Recent Labs    01/08/21 0618 01/09/21 0512  NA 132* 137  K 2.4* 4.1  CL 95* 104  CO2 22 23  GLUCOSE 135* 86  BUN 24* 20  CREATININE 1.00 0.76  CALCIUM 7.8* 8.2*   PT/INR No results for input(s): LABPROT, INR in the last 72 hours. CMP     Component Value Date/Time   NA 137 01/09/2021 0512   NA 140 12/18/2020 1548   K 4.1 01/09/2021 0512   CL 104 01/09/2021 0512   CO2 23 01/09/2021 0512   GLUCOSE 86 01/09/2021 0512   BUN 20 01/09/2021 0512   BUN 11 12/18/2020 1548   CREATININE 0.76 01/09/2021 0512   CREATININE 0.94 05/08/2016 0741   CALCIUM 8.2 (L) 01/09/2021 0512   PROT 5.2 (L) 01/09/2021 0512   PROT  6.2 12/18/2020 1548   ALBUMIN 2.5 (L) 01/09/2021 0512   ALBUMIN 3.6 (L) 12/18/2020 1548   AST 20 01/09/2021 0512   ALT 22 01/09/2021 0512   ALKPHOS 50 01/09/2021 0512   BILITOT 1.1 01/09/2021 0512   BILITOT 0.7 12/18/2020 1548   GFRNONAA >60 01/09/2021 0512   GFRAA 77 12/18/2020 1548   Lipase     Component Value Date/Time   LIPASE 22 01/08/2021 0618       Studies/Results: CT Abdomen Pelvis W Contrast  Result Date: 01/08/2021 CLINICAL DATA:  Periumbilical abdominal pain. History of Crohn's disease. EXAM: CT ABDOMEN AND PELVIS WITH CONTRAST TECHNIQUE: Multidetector CT imaging of the abdomen and pelvis was performed using the standard protocol following bolus administration of intravenous contrast. CONTRAST:  181m OMNIPAQUE IOHEXOL 300 MG/ML  SOLN COMPARISON:  12/04/2020 FINDINGS: Lower chest: The lung bases are clear of acute process. No pleural effusion or pulmonary lesions. The heart is normal in size. No pericardial effusion. The distal esophagus and aorta are unremarkable. Hepatobiliary: No hepatic lesions or intrahepatic biliary dilatation. No common bile duct dilatation. Pancreas: Moderate pancreatic atrophy but no mass or inflammation. Spleen: Normal size.  No focal lesions. Adrenals/Urinary Tract: The adrenal glands and kidneys are unremarkable. The bladder is unremarkable. Stomach/Bowel: The stomach is unremarkable. The duodenum is unremarkable. High-grade small-bowel obstruction with markedly dilated fluid-filled loops of small  bowel with air-fluid levels extending down into the pelvis. Tight stricture or adhesive process in the mid to lower central pelvis as demonstrated on the prior CT scan likely related to the patient's Crohn's disease. 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 Some associated mucosal enhancement could be local inflammatory process or possible active Crohn's disease. No definite abscess.  Second area of probable adhesive change the mid small bowel with definite caliber change centrally at the level of the iliac crest. The colon is relatively decompressed. Vascular/Lymphatic: The aorta is normal in caliber. No dissection. The branch vessels are patent. The major venous structures are patent. No mesenteric or retroperitoneal mass or adenopathy. Small scattered lymph nodes are noted. Reproductive: Surgically absent. Other: No free air or free fluid collections to suggest perforation. Musculoskeletal: No significant bony findings. IMPRESSION: 1. High-grade distal small-bowel obstruction likely due to stricture or adhesive process in the mid to lower central pelvis as demonstrated on the prior CT scan. 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 Second area of probable adhesive change in the mid small bowel with definite caliber change centrally at the level of the iliac crest. 2. No free air or free fluid collections to suggest perforation. Electronically Signed   By: Marijo Sanes M.D.   On: 01/08/2021 09:15   DG Abd Portable 1V-Small Bowel Obstruction Protocol-initial, 8 hr delay  Result Date: 01/08/2021 CLINICAL DATA:  Small-bowel obstruction EXAM: PORTABLE ABDOMEN - 1 VIEW COMPARISON:  Plain film from earlier in the same day. FINDINGS: Scattered large and small bowel gas is noted. Persistent small bowel obstructive changes are seen. Contrast material is been administered and passed into the proximal small bowel. Some contrast is noted within the more distal small bowel as well although no definitive colonic contrast is noted at this time. Continued follow-up is recommended. Contrast from prior CT is noted. IMPRESSION: Persistent small-bowel obstructive change. Contrast is noted within the small bowel although no definitive colonic contrast is seen at this time. Electronically Signed   By: Inez Catalina M.D.   On: 01/08/2021 21:22   DG Abd Portable 1V-Small  Bowel Protocol-Position Verification  Result Date: 01/08/2021 CLINICAL DATA:  Nasogastric tube placement. Crohn's disease. Abdominal pain and nausea EXAM: PORTABLE ABDOMEN - 1 VIEW COMPARISON:  CT abdomen and pelvis January 08, 2021; abdominal radiograph December 04, 2020 FINDINGS: Nasogastric tube tip is at the level of the proximal gastric cardia. Side port is above the gastroesophageal junction. Multiple loops of dilated bowel again noted. No free air evident on supine examination. Lung bases clear. IMPRESSION: Nasogastric tube tip in proximal gastric cardia. Side port above the gastroesophageal junction. Advise advancing nasogastric tube approximately 8-10 cm. Dilated bowel remains. No free air evident on supine examination. Electronically Signed   By: Lowella Grip III M.D.   On: 01/08/2021 11:18    Anti-infectives: Anti-infectives (From admission, onward)   Start     Dose/Rate Route Frequency Ordered Stop   01/08/21 2000  metroNIDAZOLE (FLAGYL) IVPB 500 mg        500 mg 100 mL/hr over 60 Minutes Intravenous Every 8 hours 01/08/21 1737     01/08/21 1830  ciprofloxacin (CIPRO) IVPB 400 mg        400 mg 200 mL/hr over 60 Minutes Intravenous Every 12 hours 01/08/21 1737       Assessment/Plan Hypertension Tobacco abuse -none for  30 days GERD Anxiety/depression Weight loss/severe deconditioning   Recurrent  SBO Crohn's disease with tight stricture mid to lower central pelvis, 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 - last colonoscopy 08/2019  - clinically pSBO slightly improved with +BMs and some flatus. Still with thick NG output. Still with upper abdominal fullness and pain due to known pSBO. Continue IV steroids and medical  management per GI. I will see the patient in the morning.    FEN: N.p.o./IV fluid, NG LIWS ID: None DVT: SCDs/per medicine   LOS: 1 day    Obie Dredge, [facility] Please see Amion for pager number during day hours 7:00am-4:30pm

## 2021-01-09 NOTE — Plan of Care (Signed)

## 2021-01-09 NOTE — Progress Notes (Signed)
[facility]                                                                              Patient Demographics  Cynthia [name], is a 60 y.o. female, DOB - 02-Jan-1961, DHR:[id number]  Admit date - 01/08/2021   Admitting Physician Jonnie Finner, DO  Outpatient Primary MD for the patient is Denita Lung, MD  Outpatient specialists:   LOS - 1  days   Medical records reviewed and are as summarized below:    Chief Complaint  Patient presents with  . Abdominal Pain       Brief summary   Patient is a 60 year old female with history of Crohn's disease, hypertension, recurrent SBO.  Presented with abdominal pain, N/V. She has a recent hospitalization in February 2022. 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 She was able to recover with NGT decompression and bowel rest. She states that a few days after discharge she had an epigastric pain that felt like a sharp rock and then turned into "rumbling and gurgling". She spoke with her GI doc (Dr. Fuller Plan). She was given prednisone, stelara, hyoscyamine, and zofran. It help initially, but then no longer provided relief. She states for the last week or so, she had just suffered with the pain. She had been unable to keep anything down. She decided that she could not handle any more, so she came to the ED. She denies any other aggravating or alleviating factors.  ED Course: CT showed high-grade stricture and SBO. She was started on SBO protocol. General surgery was consulted.    Assessment & Plan    Principal Problem:   SBO (small bowel obstruction) (HCC) presented with nausea and vomiting -In the setting of recurrent SBO, Crohn's disease, possibly has a stricture or adhesions -General surgery and GI consulted.  Patient placed on n.p.o. 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 -CRP 0.5, ESR 0 -GI following, placed on IV ciprofloxacin and Flagyl, IV Solu-Medrol -Per GI, if no improvement, will taper to IV Solu-Medrol and may need surgical intervention -Last colonoscopy in 2020    GERD - Currently stable - Placed on IV PPI, on Nexium 40 mg twice daily outpatient    Hypertension -Currently stable, hold losartan, HCTZ  History of depression, anxiety -Hold oral Effexor   Code Status: Full code DVT Prophylaxis:  SCDs Start: 01/08/21 1301   Level of Care: Level of care: Telemetry Family Communication: Discussed all imaging results, lab results, explained to the patient    Disposition Plan:     Status is: Inpatient  Remains inpatient appropriate because:Inpatient level of care appropriate due to severity of illness   Dispo: The patient is from: Home              Anticipated d/c is to: Home  Patient currently is not medically stable to d/c.  Currently n.p.o. with NG tube to suction   Difficult to place patient No      Time Spent in minutes 35 minutes  Procedures:  NG tube  Consultants:   General surgery GI  Antimicrobials:   Anti-infectives (From admission, onward)   Start     Dose/Rate Route Frequency Ordered Stop   01/08/21 2000  metroNIDAZOLE (FLAGYL) IVPB 500 mg        500 mg 100 mL/hr over 60 Minutes Intravenous Every 8 hours 01/08/21 1737     01/08/21 1830  ciprofloxacin (CIPRO) IVPB 400 mg        400 mg 200 mL/hr over 60 Minutes Intravenous Every 12 hours 01/08/21 1737           Medications  Scheduled Meds: . methylPREDNISolone (SOLU-MEDROL) injection  30 mg Intravenous Q12H   Continuous Infusions: . sodium chloride 100 mL/hr at 01/09/21 0607  . ciprofloxacin 400 mg (01/09/21 0609)  . metronidazole 500 mg (01/09/21 1220)   PRN  Meds:.acetaminophen **OR** acetaminophen, fentaNYL (SUBLIMAZE) injection, LORazepam, ondansetron **OR** ondansetron (ZOFRAN) IV, phenylephrine      Subjective:   Mishika Flippen was seen and examined today.  States pain comes in waves with gurgling, currently controlled with the current regimen.  NG output improved.  No ongoing fevers or chills, no acute events overnight.    Objective:   Vitals:   01/08/21 2008 01/09/21 0026 01/09/21 0509 01/09/21 1251  BP: (!) 134/99 118/83 136/90 (!) 129/95  Pulse: 96 86 97 98  Resp: 20 20 16  (!) 25  Temp: 97.8 F (36.6 C) 97.7 F (36.5 C) 98.1 F (36.7 C)   TempSrc: Oral Axillary    SpO2: 100% 99% 100% 100%  Weight:      Height:        Intake/Output Summary (Last 24 hours) at 01/09/2021 1652 Last data filed at 01/09/2021 1740 Gross per 24 hour  Intake 299.03 ml  Output 550 ml  Net -250.97 ml     Wt Readings from Last 3 Encounters:  01/08/21 60.9 kg  12/25/20 61.5 kg  12/18/20 67 kg     Exam  General: Alert and oriented x 3, NAD  Cardiovascular: S1 S2 auscultated, no murmurs, RRR  Respiratory: Clear to auscultation bilaterally, no wheezing, rales or rhonchi  Gastrointestinal: Soft, + distended, hypoactive bowel sounds  Ext: no pedal edema bilaterally  Neuro: no new FND's  Musculoskeletal: No digital cyanosis, clubbing  Skin: No rashes  Psych: Normal affect and demeanor, alert and oriented x3    Data Reviewed:  I have personally reviewed following labs and imaging studies  Micro Results Recent Results (from the past 240 hour(s))  Resp Panel by RT-PCR (Flu A&B, Covid) Nasopharyngeal Swab     Status: None   Collection Time: 01/08/21 10:45 AM   Specimen: Nasopharyngeal Swab; Nasopharyngeal(NP) swabs in vial transport medium  Result Value Ref Range Status   SARS Coronavirus 2 by RT PCR NEGATIVE NEGATIVE Final    Comment: (NOTE) SARS-CoV-2 target nucleic acids are NOT DETECTED.  The SARS-CoV-2 RNA is generally  detectable in upper respiratory specimens during the acute phase of infection. The lowest concentration of SARS-CoV-2 viral copies this assay can detect is 138 copies/mL. A negative result does not preclude SARS-Cov-2 infection and should not be used as the sole basis for treatment or other patient management decisions. A negative result may occur with  improper specimen collection/handling, submission of specimen other than nasopharyngeal  swab, presence of viral mutation(s) within the areas targeted by this assay, and inadequate number of viral copies(<138 copies/mL). A negative result must be combined with clinical observations, patient history, and epidemiological information. The expected result is Negative.  Fact Sheet for Patients:  EntrepreneurPulse.com.au  Fact Sheet for Healthcare Providers:  IncredibleEmployment.be  This test is no t yet approved or cleared by the [place] FDA and  has been authorized for detection and/or diagnosis of SARS-CoV-2 by FDA under an Emergency Use Authorization (EUA). This EUA will remain  in effect (meaning this test can be used) for the duration of the COVID-19 declaration under Section 564(b)(1) of the Act, 21 U.S.C.section 360bbb-3(b)(1), unless the authorization is terminated  or revoked sooner.       Influenza A by PCR NEGATIVE NEGATIVE Final   Influenza B by PCR NEGATIVE NEGATIVE Final    Comment: (NOTE) The Xpert Xpress SARS-CoV-2/FLU/RSV plus assay is intended as an aid in the diagnosis of influenza from Nasopharyngeal swab specimens and should not be used as a sole basis for treatment. Nasal washings and aspirates are unacceptable for Xpert Xpress SARS-CoV-2/FLU/RSV testing.  Fact Sheet for Patients: EntrepreneurPulse.com.au  Fact Sheet for Healthcare Providers: IncredibleEmployment.be  This test is not yet approved or cleared by the [place] FDA  and has been authorized for detection and/or diagnosis of SARS-CoV-2 by FDA under an Emergency Use Authorization (EUA). This EUA will remain in effect (meaning this test can be used) for the duration of the COVID-19 declaration under Section 564(b)(1) of the Act, 21 U.S.C. section 360bbb-3(b)(1), unless the authorization is terminated or revoked.  Performed at [facility], [place] [address], [place], [place] [address]     Radiology Reports CT Abdomen Pelvis W Contrast  Result Date: 01/08/2021 CLINICAL DATA:  Periumbilical abdominal pain. History of Crohn's disease. EXAM: CT ABDOMEN AND PELVIS WITH CONTRAST TECHNIQUE: Multidetector CT imaging of the abdomen and pelvis was performed using the standard protocol following bolus administration of intravenous contrast. CONTRAST:  171m OMNIPAQUE IOHEXOL 300 MG/ML  SOLN COMPARISON:  12/04/2020 FINDINGS: Lower chest: The lung bases are clear of acute process. No pleural effusion or pulmonary lesions. The heart is normal in size. No pericardial effusion. The distal esophagus and aorta are unremarkable. Hepatobiliary: No hepatic lesions or intrahepatic biliary dilatation. No common bile duct dilatation. Pancreas: Moderate pancreatic atrophy but no mass or inflammation. Spleen: Normal size.  No focal lesions. Adrenals/Urinary Tract: The adrenal glands and kidneys are unremarkable. The bladder is unremarkable. Stomach/Bowel: The stomach is unremarkable. The duodenum is unremarkable. High-grade small-bowel obstruction with markedly dilated fluid-filled loops of small bowel with air-fluid levels extending down into the pelvis. Tight stricture or adhesive process in the mid to lower central pelvis as demonstrated on the prior CT scan likely related to the patient's Crohn's disease. 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 Some associated mucosal enhancement could be local  inflammatory process or possible active Crohn's disease. No definite abscess. Second area of probable adhesive change the mid small bowel with definite caliber change centrally at the level of the iliac crest. The colon is relatively decompressed. Vascular/Lymphatic: The aorta is normal in caliber. No dissection. The branch vessels are patent. The major venous structures are patent. No mesenteric or retroperitoneal mass or adenopathy. Small scattered lymph nodes are noted. Reproductive: Surgically absent. Other: No free air or free fluid collections to suggest perforation. Musculoskeletal:  No significant bony findings. IMPRESSION: 1. High-grade distal small-bowel obstruction likely due to stricture or adhesive process in the mid to lower central pelvis as demonstrated on the prior CT scan. 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 Second area of probable adhesive change in the mid small bowel with definite caliber change centrally at the level of the iliac crest. 2. No free air or free fluid collections to suggest perforation. Electronically Signed   By: Marijo Sanes M.D.   On: 01/08/2021 09:15   DG Abd Portable 1V-Small Bowel Obstruction Protocol-initial, 8 hr delay  Result Date: 01/08/2021 CLINICAL DATA:  Small-bowel obstruction EXAM: PORTABLE ABDOMEN - 1 VIEW COMPARISON:  Plain film from earlier in the same day. FINDINGS: Scattered large and small bowel gas is noted. Persistent small bowel obstructive changes are seen. Contrast material is been administered and passed into the proximal small bowel. Some contrast is noted within the more distal small bowel as well although no definitive colonic contrast is noted at this time. Continued follow-up is recommended. Contrast from prior CT is noted. IMPRESSION: Persistent small-bowel obstructive change. Contrast is noted within the small bowel although no definitive colonic contrast is seen at this time. Electronically Signed    By: Inez Catalina M.D.   On: 01/08/2021 21:22   DG Abd Portable 1V-Small Bowel Protocol-Position Verification  Result Date: 01/08/2021 CLINICAL DATA:  Nasogastric tube placement. Crohn's disease. Abdominal pain and nausea EXAM: PORTABLE ABDOMEN - 1 VIEW COMPARISON:  CT abdomen and pelvis January 08, 2021; abdominal radiograph December 04, 2020 FINDINGS: Nasogastric tube tip is at the level of the proximal gastric cardia. Side port is above the gastroesophageal junction. Multiple loops of dilated bowel again noted. No free air evident on supine examination. Lung bases clear. IMPRESSION: Nasogastric tube tip in proximal gastric cardia. Side port above the gastroesophageal junction. Advise advancing nasogastric tube approximately 8-10 cm. Dilated bowel remains. No free air evident on supine examination. Electronically Signed   By: Lowella Grip III M.D.   On: 01/08/2021 11:18    Lab Data:  CBC: Recent Labs  Lab 01/08/21 0618 01/09/21 0512  WBC 13.7* 9.3  HGB 14.3 13.4  HCT 43.4 41.1  MCV 87.5 89.9  PLT 329 161   Basic Metabolic Panel: Recent Labs  Lab 01/08/21 0618 01/09/21 0512  NA 132* 137  K 2.4* 4.1  CL 95* 104  CO2 22 23  GLUCOSE 135* 86  BUN 24* 20  CREATININE 1.00 0.76  CALCIUM 7.8* 8.2*  MG 2.1  --   PHOS 2.4*  --    GFR: Estimated Creatinine Clearance: 62.5 mL/min (by C-G formula based on SCr of 0.76 mg/dL). Liver Function Tests: Recent Labs  Lab 01/08/21 0618 01/09/21 0512  AST 23 20  ALT 22 22  ALKPHOS 56 50  BILITOT 1.7*  1.8* 1.1  PROT 5.7* 5.2*  ALBUMIN 2.8* 2.5*   Recent Labs  Lab 01/08/21 0618  LIPASE 22   No results for input(s): AMMONIA in the last 168 hours. Coagulation Profile: No results for input(s): INR, PROTIME in the last 168 hours. Cardiac Enzymes: No results for input(s): CKTOTAL, CKMB, CKMBINDEX, TROPONINI in the last 168 hours. BNP (last 3 results) No results for input(s): PROBNP in the last 8760 hours. HbA1C: No results for  input(s): HGBA1C in the last 72 hours. CBG: No results for input(s): GLUCAP in the last 168 hours. Lipid Profile: No results for input(s): CHOL, HDL, LDLCALC, TRIG, CHOLHDL,  LDLDIRECT in the last 72 hours. Thyroid Function Tests: No results for input(s): TSH, T4TOTAL, FREET4, T3FREE, THYROIDAB in the last 72 hours. Anemia Panel: No results for input(s): VITAMINB12, FOLATE, FERRITIN, TIBC, IRON, RETICCTPCT in the last 72 hours. Urine analysis:    Component Value Date/Time   COLORURINE YELLOW 01/08/2021 1045   APPEARANCEUR CLEAR 01/08/2021 1045   LABSPEC >1.046 (H) 01/08/2021 1045   LABSPEC 1,025 12/18/2020 1644   PHURINE 6.0 01/08/2021 1045   GLUCOSEU NEGATIVE 01/08/2021 1045   HGBUR NEGATIVE 01/08/2021 1045   BILIRUBINUR NEGATIVE 01/08/2021 1045   BILIRUBINUR small (A) 12/18/2020 1644   BILIRUBINUR n 12/04/2011 1014   KETONESUR 20 (A) 01/08/2021 1045   PROTEINUR NEGATIVE 01/08/2021 1045   UROBILINOGEN negative 12/04/2011 1014   NITRITE NEGATIVE 01/08/2021 [place] 01/08/2021 1045     Miel Wisener M.D. [facility] 01/09/2021, 4:52 PM  Available via Epic secure chat 7am-7pm After 7 pm, please refer to night coverage provider listed on amion.

## 2021-01-09 NOTE — Progress Notes (Addendum)
Progress Note  Chief Complaint:    Vomiting, abdominal pain and Crohn's disease   Attending physician's note   I have taken an interval history, reviewed the chart and examined the patient. I agree with the Advanced Practitioner's note, impression and recommendations.   Inflammatory markers are not significantly elevated.  High grade partial SBO is possibly a fibro stenotic lesion, this is a recurrent episode within 4 weeks  Continue NG tube suction, IV steroids, Cipro and Flagyl If no improvement by tomorrow, will plan to taper down to IV Solumedrol. She may likely need surgical intervention.    I have spent 25 minutes of patient care (this includes precharting, chart review, review of results, face-to-face time used for counseling as well as treatment plan and follow-up. The patient was provided an opportunity to ask questions and all were answered. The patient agreed with the plan and demonstrated an understanding of the instructions.  Damaris Hippo , MD [phone]        ASSESSMENT / PLAN:    Brief history: 60 year old female with HTN, tobacco use, Crohn's ileocolitis with ileal stricture, enteroenteric fistula , on Stelara. Admitted with recurrent SBO. Recently admitted with SBO, saw Dr. Fuller Plan in follow up 2/22 and had run out of prednisone and was having obstructive symptoms again. She was restarted on Prednisone taper but presented to ED yesterday with abdominal pain and vomiting.  CT scan shows high grade distal SBO and suspicion of small enteroenteric fistula in the area.      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 She got 3rd dose of stelara on 12/17/20 (previously on Humira).  SBO probably secondary to stricturing and / or adhesions.  --CRP 0.5, ESR 0. WBC 13.7 >>> 9.3 . Hgb 13.4.  --Continue cipro / flagyl and IV solumedrol --Continue NGT decompression --General Surgery is following.  May need bowel resection if continues to fail conservative  therapy. --Increased risk for DVT, continue SCDs     SUBJECTIVE:   Feels about the same. Nausea is better with NGT decompression but still having episodes of mid upper abdominal pain occurring several times a day. Having bowel movements.     OBJECTIVE:   01/08/21 CT scan w/ contrast  IMPRESSION: 1. High-grade distal small-bowel obstruction likely due to stricture or adhesive process in the mid to lower central pelvis as demonstrated on the prior CT scan. 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 Second area of probable adhesive change in the mid small bowel with definite caliber change centrally at the level of the iliac crest. 2. No free air or free fluid collections to suggest perforation   Scheduled inpatient medications:  . methylPREDNISolone (SOLU-MEDROL) injection  30 mg Intravenous Q12H   Continuous inpatient infusions:  . sodium chloride 100 mL/hr at 01/09/21 0607  . ciprofloxacin 400 mg (01/09/21 0609)  . metronidazole 500 mg (01/09/21 4536)   PRN inpatient medications: acetaminophen **OR** acetaminophen, fentaNYL (SUBLIMAZE) injection, LORazepam, ondansetron **OR** ondansetron (ZOFRAN) IV, phenylephrine  Vital signs in last 24 hours: Temp:  [97.6 F (36.4 C)-98.1 F (36.7 C)] 98.1 F (36.7 C) (03/09 0509) Pulse Rate:  [86-110] 97 (03/09 0509) Resp:  [13-20] 16 (03/09 0509) BP: (111-136)/(83-108) 136/90 (03/09 0509) SpO2:  [96 %-100 %] 100 % (03/09 0509) Weight:  [60.9 kg] 60.9 kg (03/08 1241)    Intake/Output Summary (Last 24 hours) at 01/09/2021 1045 Last data filed at 01/09/2021 4680 Gross per 24  hour  Intake 299.03 ml  Output 550 ml  Net -250.97 ml     Physical Exam:  . General: Alert female in NAD . Heart:  Regular rate and rhythm. No lower extremity edema . Pulmonary: Normal respiratory effort . Abdomen: Soft, mild-moderately distended with tympany. Not signficantly tender. NGT suction bowel sounds.    . Neurologic: Alert and oriented . Psych: Pleasant. Cooperative.   Filed Weights   01/08/21 1241  Weight: 60.9 kg    Intake/Output from previous day: 03/08 0701 - 03/09 0700 In: 1398.5 [I.V.:88; IV Piggyback:1310.5] Out: 550 [Emesis/NG output:550] Intake/Output this shift: No intake/output data recorded.    Lab Results: Recent Labs    01/08/21 0618 01/09/21 0512  WBC 13.7* 9.3  HGB 14.3 13.4  HCT 43.4 41.1  PLT 329 257   BMET Recent Labs    01/08/21 0618 01/09/21 0512  NA 132* 137  K 2.4* 4.1  CL 95* 104  CO2 22 23  GLUCOSE 135* 86  BUN 24* 20  CREATININE 1.00 0.76  CALCIUM 7.8* 8.2*   LFT Recent Labs    01/08/21 0618 01/09/21 0512  PROT 5.7* 5.2*  ALBUMIN 2.8* 2.5*  AST 23 20  ALT 22 22  ALKPHOS 56 50  BILITOT 1.7*  1.8* 1.1  BILIDIR 0.3*  --   IBILI 1.4*  --    PT/INR No results for input(s): LABPROT, INR in the last 72 hours. Hepatitis Panel No results for input(s): HEPBSAG, HCVAB, HEPAIGM, HEPBIGM in the last 72 hours.  CT Abdomen Pelvis W Contrast  Result Date: 01/08/2021 CLINICAL DATA:  Periumbilical abdominal pain. History of Crohn's disease. EXAM: CT ABDOMEN AND PELVIS WITH CONTRAST TECHNIQUE: Multidetector CT imaging of the abdomen and pelvis was performed using the standard protocol following bolus administration of intravenous contrast. CONTRAST:  170m OMNIPAQUE IOHEXOL 300 MG/ML  SOLN COMPARISON:  12/04/2020 FINDINGS: Lower chest: The lung bases are clear of acute process. No pleural effusion or pulmonary lesions. The heart is normal in size. No pericardial effusion. The distal esophagus and aorta are unremarkable. Hepatobiliary: No hepatic lesions or intrahepatic biliary dilatation. No common bile duct dilatation. Pancreas: Moderate pancreatic atrophy but no mass or inflammation. Spleen: Normal size.  No focal lesions. Adrenals/Urinary Tract: The adrenal glands and kidneys are unremarkable. The bladder is unremarkable. Stomach/Bowel: The  stomach is unremarkable. The duodenum is unremarkable. High-grade small-bowel obstruction with markedly dilated fluid-filled loops of small bowel with air-fluid levels extending down into the pelvis. Tight stricture or adhesive process in the mid to lower central pelvis as demonstrated on the prior CT scan likely related to the patient's Crohn's disease. 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 Some associated mucosal enhancement could be local inflammatory process or possible active Crohn's disease. No definite abscess. Second area of probable adhesive change the mid small bowel with definite caliber change centrally at the level of the iliac crest. The colon is relatively decompressed. Vascular/Lymphatic: The aorta is normal in caliber. No dissection. The branch vessels are patent. The major venous structures are patent. No mesenteric or retroperitoneal mass or adenopathy. Small scattered lymph nodes are noted. Reproductive: Surgically absent. Other: No free air or free fluid collections to suggest perforation. Musculoskeletal: No significant bony findings. IMPRESSION: 1. High-grade distal small-bowel obstruction likely due to stricture or adhesive process in the mid to lower central pelvis as demonstrated on the prior CT scan. Findings suspicious for  a small bowel to small bowel fistula in this area also. Second area of probable adhesive change in the mid small bowel with definite caliber change centrally at the level of the iliac crest. 2. No free air or free fluid collections to suggest perforation. Electronically Signed   By: Marijo Sanes M.D.   On: 01/08/2021 09:15   DG Abd Portable 1V-Small Bowel Obstruction Protocol-initial, 8 hr delay  Result Date: 01/08/2021 CLINICAL DATA:  Small-bowel obstruction EXAM: PORTABLE ABDOMEN - 1 VIEW COMPARISON:  Plain film from earlier in the same day. FINDINGS: Scattered large and small bowel gas  is noted. Persistent small bowel obstructive changes are seen. Contrast material is been administered and passed into the proximal small bowel. Some contrast is noted within the more distal small bowel as well although no definitive colonic contrast is noted at this time. Continued follow-up is recommended. Contrast from prior CT is noted. IMPRESSION: Persistent small-bowel obstructive change. Contrast is noted within the small bowel although no definitive colonic contrast is seen at this time. Electronically Signed   By: Inez Catalina M.D.   On: 01/08/2021 21:22   DG Abd Portable 1V-Small Bowel Protocol-Position Verification  Result Date: 01/08/2021 CLINICAL DATA:  Nasogastric tube placement. Crohn's disease. Abdominal pain and nausea EXAM: PORTABLE ABDOMEN - 1 VIEW COMPARISON:  CT abdomen and pelvis January 08, 2021; abdominal radiograph December 04, 2020 FINDINGS: Nasogastric tube tip is at the level of the proximal gastric cardia. Side port is above the gastroesophageal junction. Multiple loops of dilated bowel again noted. No free air evident on supine examination. Lung bases clear. IMPRESSION: Nasogastric tube tip in proximal gastric cardia. Side port above the gastroesophageal junction. Advise advancing nasogastric tube approximately 8-10 cm. Dilated bowel remains. No free air evident on supine examination. Electronically Signed   By: Lowella Grip III M.D.   On: 01/08/2021 11:18    PREVIOUS ENDOSCOPIES:   Active Problems:   SBO (small bowel obstruction) ([place])     LOS: 1 day   Tye Savoy ,NP 01/09/2021, 10:45 AM

## 2021-01-10 ENCOUNTER — Inpatient Hospital Stay (HOSPITAL_COMMUNITY): Payer: 59

## 2021-01-10 MED ORDER — METHYLPREDNISOLONE SODIUM SUCC 40 MG IJ SOLR
20.0000 mg | Freq: Two times a day (BID) | INTRAMUSCULAR | Status: DC
  Administered 2021-01-10 – 2021-01-11 (×2): 20 mg via INTRAVENOUS
  Filled 2021-01-10 (×2): qty 1

## 2021-01-10 NOTE — Progress Notes (Addendum)
Progress Note  Chief Complaint:  Crohn's disease       Attending physician's note   I have taken an interval history, reviewed the chart and examined the patient. I agree with the Advanced Practitioner's note, impression and recommendations.   We will taper Solu-Medrol dose to 20 mg twice daily NG tube was clamped and she is doing a trial of clears Abdominal x-ray showed persistent dilation of small bowel loops.  She has high-grade stenosis and possible enteroenteric fistula with no significant improvement on maximal medical therapy. She will likely need surgical intervention to prevent recurrent SBO   The patient was provided an opportunity to ask questions and all were answered. The patient agreed with the plan and demonstrated an understanding of the instructions.  Damaris Hippo , MD [phone]    ASSESSMENT / PLAN:    Brief history: 60 year old female with HTN, tobacco use, Crohn's ileocolitis with ileal stricture, enteroenteric fistula , on Stelara. Admitted with recurrent SBO. Recently admitted with SBO, saw Dr. Fuller Plan in follow up 2/22 and had run out of prednisone and was having obstructive symptoms again. She was restarted on Prednisone taper but presented to ED 01/08/21 with abdominal pain and vomiting.  CT scan shows high grade distal SBO and suspicion of small enteroenteric fistula in the area.      #  Fistulizing Crohn's with recurrent SBO. She got 3rd dose of Stelara on 12/17/20 (previously on Humira).  SBO probably secondary to stricturing more than ongoing inflammation.   --Inflammatory markers are normal including CRP 0.5, ESR 0.   --Getting cipro / flagyl and IV solumedrol 30 mg BID. --300 cc NGT output yesterday --[facility] has just clamped NGT and giving her a trial of liquids. She has so far tolerated an [place] ice . She has been having BMs but is mildly distended with high pitched bowel sounds. KUB is pending.  --Increased risk for DVT, continue SCDs    Addendum: Just reviewed KUB - dilated small bowel loops c/w SBO, unchanged from prior exam. We may need to consider tapering steroids in case she does require surgery.         SUBJECTIVE:   Crying, doesn't feel like she has enough family support right now. Having to navigate everything on her own. Plans to start ambulating now that NGT clamped.     OBJECTIVE:    Scheduled inpatient medications:  . methylPREDNISolone (SOLU-MEDROL) injection  30 mg Intravenous Q12H  . pantoprazole (PROTONIX) IV  40 mg Intravenous Q24H   Continuous inpatient infusions:  . sodium chloride 100 mL/hr at 01/10/21 0521  . ciprofloxacin 400 mg (01/10/21 0524)  . metronidazole 500 mg (01/10/21 0523)   PRN inpatient medications: acetaminophen **OR** acetaminophen, fentaNYL (SUBLIMAZE) injection, LORazepam, ondansetron **OR** ondansetron (ZOFRAN) IV, phenylephrine  Vital signs in last 24 hours: Temp:  [97.8 F (36.6 C)] 97.8 F (36.6 C) (03/09 2130) Pulse Rate:  [79-98] 79 (03/09 2130) Resp:  [16-25] 16 (03/09 2130) BP: (129-145)/(95-96) 145/96 (03/09 2130) SpO2:  [100 %] 100 % (03/10 0827) Last BM Date: 01/10/21  Intake/Output Summary (Last 24 hours) at 01/10/2021 0918 Last data filed at 01/10/2021 0641 Gross per 24 hour  Intake -  Output 300 ml  Net -300 ml     Physical Exam:  . General: Alert female in NAD . Heart:  Regular rate and rhythm. No lower extremity edema . Pulmonary: Normal respiratory effort . Abdomen: Soft, mildly distended, high pitched bowel sounds. Not significantly tender.  Marland Kitchen  Neurologic: Alert and oriented . Psych: Pleasant. Cooperative.   Filed Weights   01/08/21 1241  Weight: 60.9 kg    Intake/Output from previous day: 03/09 0701 - 03/10 0700 In: -  Out: 300 [Emesis/NG output:300] Intake/Output this shift: No intake/output data recorded.    Lab Results: Recent Labs    01/08/21 0618 01/09/21 0512  WBC 13.7* 9.3  HGB 14.3 13.4  HCT 43.4 41.1  PLT 329  257   BMET Recent Labs    01/08/21 0618 01/09/21 0512  NA 132* 137  K 2.4* 4.1  CL 95* 104  CO2 22 23  GLUCOSE 135* 86  BUN 24* 20  CREATININE 1.00 0.76  CALCIUM 7.8* 8.2*   LFT Recent Labs    01/08/21 0618 01/09/21 0512  PROT 5.7* 5.2*  ALBUMIN 2.8* 2.5*  AST 23 20  ALT 22 22  ALKPHOS 56 50  BILITOT 1.7*  1.8* 1.1  BILIDIR 0.3*  --   IBILI 1.4*  --    PT/INR No results for input(s): LABPROT, INR in the last 72 hours. Hepatitis Panel No results for input(s): HEPBSAG, HCVAB, HEPAIGM, HEPBIGM in the last 72 hours.  DG Abd Portable 1V-Small Bowel Obstruction Protocol-initial, 8 hr delay  Result Date: 01/08/2021 CLINICAL DATA:  Small-bowel obstruction EXAM: PORTABLE ABDOMEN - 1 VIEW COMPARISON:  Plain film from earlier in the same day. FINDINGS: Scattered large and small bowel gas is noted. Persistent small bowel obstructive changes are seen. Contrast material is been administered and passed into the proximal small bowel. Some contrast is noted within the more distal small bowel as well although no definitive colonic contrast is noted at this time. Continued follow-up is recommended. Contrast from prior CT is noted. IMPRESSION: Persistent small-bowel obstructive change. Contrast is noted within the small bowel although no definitive colonic contrast is seen at this time. Electronically Signed   By: Inez Catalina M.D.   On: 01/08/2021 21:22   DG Abd Portable 1V-Small Bowel Protocol-Position Verification  Result Date: 01/08/2021 CLINICAL DATA:  Nasogastric tube placement. Crohn's disease. Abdominal pain and nausea EXAM: PORTABLE ABDOMEN - 1 VIEW COMPARISON:  CT abdomen and pelvis January 08, 2021; abdominal radiograph December 04, 2020 FINDINGS: Nasogastric tube tip is at the level of the proximal gastric cardia. Side port is above the gastroesophageal junction. Multiple loops of dilated bowel again noted. No free air evident on supine examination. Lung bases clear. IMPRESSION:  Nasogastric tube tip in proximal gastric cardia. Side port above the gastroesophageal junction. Advise advancing nasogastric tube approximately 8-10 cm. Dilated bowel remains. No free air evident on supine examination. Electronically Signed   By: Lowella Grip III M.D.   On: 01/08/2021 11:18    PREVIOUS ENDOSCOPIES:      Principal Problem:   SBO (small bowel obstruction) ([facility]) Active Problems:   GERD   Hypertension   Obesity (BMI 30-39.9)   Crohn disease ([place])     LOS: 2 days   Tye Savoy ,NP 01/10/2021, 9:18 AM

## 2021-01-10 NOTE — Progress Notes (Signed)
[facility]                                                                              Patient Demographics  Cynthia [name], is a 60 y.o. female, DOB - 03/24/1961, JSE:[id number]  Admit date - 01/08/2021   Admitting Physician Jonnie Finner, DO  Outpatient Primary MD for the patient is Denita Lung, MD  Outpatient specialists:   LOS - 2  days   Medical records reviewed and are as summarized below:    Chief Complaint  Patient presents with  . Abdominal Pain       Brief summary   Patient is a 60 year old female with history of Crohn's disease, hypertension, recurrent SBO.  Presented with abdominal pain, N/V. She has a recent hospitalization in February 2022. 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 She was able to recover with NGT decompression and bowel rest. She states that a few days after discharge she had an epigastric pain that felt like a sharp rock and then turned into "rumbling and gurgling". She spoke with her GI doc (Dr. Fuller Plan). She was given prednisone, stelara, hyoscyamine, and zofran. It help initially, but then no longer provided relief. She states for the last week or so, she had just suffered with the pain. She had been unable to keep anything down. She decided that she could not handle any more, so she came to the ED. She denies any other aggravating or alleviating factors.  ED Course: CT showed high-grade stricture and SBO. She was started on SBO protocol. General surgery was consulted.    Assessment & Plan    Principal Problem:   SBO (small bowel obstruction) (HCC) presented with nausea and vomiting - In the setting of recurrent SBO, Crohn's disease, possibly has a stricture or adhesions - General surgery and GI consulted.  Patient placed on n.p.o. 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 -CRP 0.5, ESR 0 -GI following, placed on IV ciprofloxacin and Flagyl, IV Solu-Medrol -Per GI, if no improvement, will taper to IV Solu-Medrol and may need surgical intervention -Last colonoscopy in 2020    GERD - Currently stable -Continue PPI    Hypertension -Currently stable, hold losartan, HCTZ  History of depression, anxiety -Hold oral Effexor   Code Status: Full code DVT Prophylaxis:  SCDs Start: 01/08/21 1301   Level of Care: Level of care: Telemetry Family Communication: Discussed all imaging results, lab results, explained to the patient    Disposition Plan:     Status is: Inpatient  Remains inpatient appropriate because:Inpatient level of care appropriate due to severity of illness   Dispo: The patient is from: Home              Anticipated d/c is to: Home              Patient currently is not medically stable to d/c.  Currently n.p.o. with NG tube to  suction   Difficult to place patient No      Time Spent in minutes 35 minutes  Procedures:  NG tube  Consultants:   General surgery GI  Antimicrobials:   Anti-infectives (From admission, onward)   Start     Dose/Rate Route Frequency Ordered Stop   01/08/21 2000  metroNIDAZOLE (FLAGYL) IVPB 500 mg        500 mg 100 mL/hr over 60 Minutes Intravenous Every 8 hours 01/08/21 1737     01/08/21 1830  ciprofloxacin (CIPRO) IVPB 400 mg        400 mg 200 mL/hr over 60 Minutes Intravenous Every 12 hours 01/08/21 1737           Medications  Scheduled Meds: . methylPREDNISolone (SOLU-MEDROL) injection  20 mg Intravenous Q12H  . pantoprazole (PROTONIX) IV  40 mg Intravenous Q24H   Continuous Infusions: . sodium chloride 100 mL/hr at 01/10/21 1407  . ciprofloxacin Stopped (01/10/21 1406)  . metronidazole 500 mg (01/10/21 1250)   PRN Meds:.acetaminophen **OR** acetaminophen, fentaNYL  (SUBLIMAZE) injection, LORazepam, ondansetron **OR** ondansetron (ZOFRAN) IV, phenylephrine      Subjective:   Cynthia [name] was seen and examined today.  Continues to have NG tube, states having multiple loose bowel movements, no nausea or vomiting.  Slightly feeling better today.  No ongoing fevers or chills.  Objective:   Vitals:   01/09/21 1251 01/09/21 2130 01/10/21 0827 01/10/21 1314  BP: (!) 129/95 (!) 145/96  121/84  Pulse: 98 79  100  Resp: (!) 25 16  18   Temp:  97.8 F (36.6 C)  98.3 F (36.8 C)  TempSrc:  Oral  Oral  SpO2: 100% 100% 100% 100%  Weight:      Height:        Intake/Output Summary (Last 24 hours) at 01/10/2021 1610 Last data filed at 01/10/2021 1407 Gross per 24 hour  Intake 5215.25 ml  Output 300 ml  Net 4915.25 ml     Wt Readings from Last 3 Encounters:  01/08/21 60.9 kg  12/25/20 61.5 kg  12/18/20 67 kg   Physical Exam  General: Alert and oriented x 3, NAD, NG tube  Cardiovascular: S1 S2 clear, RRR. No pedal edema b/l  Respiratory: CTAB, no wheezing, rales or rhonchi  Gastrointestinal: Soft, nontender, less distended today, high-pitched bowel sounds  Ext: no pedal edema bilaterally  Neuro: no new deficits  Musculoskeletal: No cyanosis, clubbing  Skin: No rashes  Psych: Normal affect and demeanor, alert and oriented x3      Data Reviewed:  I have personally reviewed following labs and imaging studies  Micro Results Recent Results (from the past 240 hour(s))  Resp Panel by RT-PCR (Flu A&B, Covid) Nasopharyngeal Swab     Status: None   Collection Time: 01/08/21 10:45 AM   Specimen: Nasopharyngeal Swab; Nasopharyngeal(NP) swabs in vial transport medium  Result Value Ref Range Status   SARS Coronavirus 2 by RT PCR NEGATIVE NEGATIVE Final    Comment: (NOTE) SARS-CoV-2 target nucleic acids are NOT DETECTED.  The SARS-CoV-2 RNA is generally detectable in upper respiratory specimens during the acute phase of infection. The  lowest concentration of SARS-CoV-2 viral copies this assay can detect is 138 copies/mL. A negative result does not preclude SARS-Cov-2 infection and should not be used as the sole basis for treatment or other patient management decisions. A negative result may occur with  improper specimen collection/handling, submission of specimen other than nasopharyngeal swab, presence of viral mutation(s) within the areas targeted  by this assay, and inadequate number of viral copies(<138 copies/mL). A negative result must be combined with clinical observations, patient history, and epidemiological information. The expected result is Negative.  Fact Sheet for Patients:  EntrepreneurPulse.com.au  Fact Sheet for Healthcare Providers:  IncredibleEmployment.be  This test is no t yet approved or cleared by the [place] FDA and  has been authorized for detection and/or diagnosis of SARS-CoV-2 by FDA under an Emergency Use Authorization (EUA). This EUA will remain  in effect (meaning this test can be used) for the duration of the COVID-19 declaration under Section 564(b)(1) of the Act, 21 U.S.C.section 360bbb-3(b)(1), unless the authorization is terminated  or revoked sooner.       Influenza A by PCR NEGATIVE NEGATIVE Final   Influenza B by PCR NEGATIVE NEGATIVE Final    Comment: (NOTE) The Xpert Xpress SARS-CoV-2/FLU/RSV plus assay is intended as an aid in the diagnosis of influenza from Nasopharyngeal swab specimens and should not be used as a sole basis for treatment. Nasal washings and aspirates are unacceptable for Xpert Xpress SARS-CoV-2/FLU/RSV testing.  Fact Sheet for Patients: EntrepreneurPulse.com.au  Fact Sheet for Healthcare Providers: IncredibleEmployment.be  This test is not yet approved or cleared by the [place] FDA and has been authorized for detection and/or diagnosis of SARS-CoV-2 by FDA under  an Emergency Use Authorization (EUA). This EUA will remain in effect (meaning this test can be used) for the duration of the COVID-19 declaration under Section 564(b)(1) of the Act, 21 U.S.C. section 360bbb-3(b)(1), unless the authorization is terminated or revoked.  Performed at [facility], [place] [address], [place], [place] [address]     Radiology Reports CT Abdomen Pelvis W Contrast  Result Date: 01/08/2021 CLINICAL DATA:  Periumbilical abdominal pain. History of Crohn's disease. EXAM: CT ABDOMEN AND PELVIS WITH CONTRAST TECHNIQUE: Multidetector CT imaging of the abdomen and pelvis was performed using the standard protocol following bolus administration of intravenous contrast. CONTRAST:  160m OMNIPAQUE IOHEXOL 300 MG/ML  SOLN COMPARISON:  12/04/2020 FINDINGS: Lower chest: The lung bases are clear of acute process. No pleural effusion or pulmonary lesions. The heart is normal in size. No pericardial effusion. The distal esophagus and aorta are unremarkable. Hepatobiliary: No hepatic lesions or intrahepatic biliary dilatation. No common bile duct dilatation. Pancreas: Moderate pancreatic atrophy but no mass or inflammation. Spleen: Normal size.  No focal lesions. Adrenals/Urinary Tract: The adrenal glands and kidneys are unremarkable. The bladder is unremarkable. Stomach/Bowel: The stomach is unremarkable. The duodenum is unremarkable. High-grade small-bowel obstruction with markedly dilated fluid-filled loops of small bowel with air-fluid levels extending down into the pelvis. Tight stricture or adhesive process in the mid to lower central pelvis as demonstrated on the prior CT scan likely related to the patient's Crohn's disease. 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 Some associated mucosal enhancement could be local inflammatory process or possible active Crohn's disease. No definite abscess. Second  area of probable adhesive change the mid small bowel with definite caliber change centrally at the level of the iliac crest. The colon is relatively decompressed. Vascular/Lymphatic: The aorta is normal in caliber. No dissection. The branch vessels are patent. The major venous structures are patent. No mesenteric or retroperitoneal mass or adenopathy. Small scattered lymph nodes are noted. Reproductive: Surgically absent. Other: No free air or free fluid collections to suggest perforation. Musculoskeletal: No significant bony findings. IMPRESSION: 1. High-grade distal small-bowel  obstruction likely due to stricture or adhesive process in the mid to lower central pelvis as demonstrated on the prior CT scan. 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 Second area of probable adhesive change in the mid small bowel with definite caliber change centrally at the level of the iliac crest. 2. No free air or free fluid collections to suggest perforation. Electronically Signed   By: Marijo Sanes M.D.   On: 01/08/2021 09:15   DG Abd Portable 1V  Result Date: 01/10/2021 CLINICAL DATA:  Recurrent small-bowel obstruction with nausea and vomiting EXAM: PORTABLE ABDOMEN - 1 VIEW COMPARISON:  01/08/2021 FINDINGS: NG tube in the body of the stomach unchanged. Multiple dilated small bowel loops unchanged. Colon decompressed. Gas in the rectum. IMPRESSION: Dilated small bowel loops compatible with small bowel obstruction. NG tube in the stomach. No change from 2 days prior Electronically Signed   By: Franchot Gallo M.D.   On: 01/10/2021 11:12   DG Abd Portable 1V-Small Bowel Obstruction Protocol-initial, 8 hr delay  Result Date: 01/08/2021 CLINICAL DATA:  Small-bowel obstruction EXAM: PORTABLE ABDOMEN - 1 VIEW COMPARISON:  Plain film from earlier in the same day. FINDINGS: Scattered large and small bowel gas is noted. Persistent small bowel obstructive changes are seen. Contrast material is  been administered and passed into the proximal small bowel. Some contrast is noted within the more distal small bowel as well although no definitive colonic contrast is noted at this time. Continued follow-up is recommended. Contrast from prior CT is noted. IMPRESSION: Persistent small-bowel obstructive change. Contrast is noted within the small bowel although no definitive colonic contrast is seen at this time. Electronically Signed   By: Inez Catalina M.D.   On: 01/08/2021 21:22   DG Abd Portable 1V-Small Bowel Protocol-Position Verification  Result Date: 01/08/2021 CLINICAL DATA:  Nasogastric tube placement. Crohn's disease. Abdominal pain and nausea EXAM: PORTABLE ABDOMEN - 1 VIEW COMPARISON:  CT abdomen and pelvis January 08, 2021; abdominal radiograph December 04, 2020 FINDINGS: Nasogastric tube tip is at the level of the proximal gastric cardia. Side port is above the gastroesophageal junction. Multiple loops of dilated bowel again noted. No free air evident on supine examination. Lung bases clear. IMPRESSION: Nasogastric tube tip in proximal gastric cardia. Side port above the gastroesophageal junction. Advise advancing nasogastric tube approximately 8-10 cm. Dilated bowel remains. No free air evident on supine examination. Electronically Signed   By: Lowella Grip III M.D.   On: 01/08/2021 11:18    Lab Data:  CBC: Recent Labs  Lab 01/08/21 0618 01/09/21 0512  WBC 13.7* 9.3  HGB 14.3 13.4  HCT 43.4 41.1  MCV 87.5 89.9  PLT 329 382   Basic Metabolic Panel: Recent Labs  Lab 01/08/21 0618 01/09/21 0512  NA 132* 137  K 2.4* 4.1  CL 95* 104  CO2 22 23  GLUCOSE 135* 86  BUN 24* 20  CREATININE 1.00 0.76  CALCIUM 7.8* 8.2*  MG 2.1  --   PHOS 2.4*  --    GFR: Estimated Creatinine Clearance: 62.5 mL/min (by C-G formula based on SCr of 0.76 mg/dL). Liver Function Tests: Recent Labs  Lab 01/08/21 0618 01/09/21 0512  AST 23 20  ALT 22 22  ALKPHOS 56 50  BILITOT 1.7*  1.8* 1.1   PROT 5.7* 5.2*  ALBUMIN 2.8* 2.5*   Recent Labs  Lab 01/08/21 0618  LIPASE 22   No results for input(s): AMMONIA in the last  168 hours. Coagulation Profile: No results for input(s): INR, PROTIME in the last 168 hours. Cardiac Enzymes: No results for input(s): CKTOTAL, CKMB, CKMBINDEX, TROPONINI in the last 168 hours. BNP (last 3 results) No results for input(s): PROBNP in the last 8760 hours. HbA1C: No results for input(s): HGBA1C in the last 72 hours. CBG: No results for input(s): GLUCAP in the last 168 hours. Lipid Profile: No results for input(s): CHOL, HDL, LDLCALC, TRIG, CHOLHDL, LDLDIRECT in the last 72 hours. Thyroid Function Tests: No results for input(s): TSH, T4TOTAL, FREET4, T3FREE, THYROIDAB in the last 72 hours. Anemia Panel: No results for input(s): VITAMINB12, FOLATE, FERRITIN, TIBC, IRON, RETICCTPCT in the last 72 hours. Urine analysis:    Component Value Date/Time   COLORURINE YELLOW 01/08/2021 1045   APPEARANCEUR CLEAR 01/08/2021 1045   LABSPEC >1.046 (H) 01/08/2021 1045   LABSPEC 1,025 12/18/2020 1644   PHURINE 6.0 01/08/2021 1045   GLUCOSEU NEGATIVE 01/08/2021 1045   HGBUR NEGATIVE 01/08/2021 1045   BILIRUBINUR NEGATIVE 01/08/2021 1045   BILIRUBINUR small (A) 12/18/2020 1644   BILIRUBINUR n 12/04/2011 1014   KETONESUR 20 (A) 01/08/2021 1045   PROTEINUR NEGATIVE 01/08/2021 1045   UROBILINOGEN negative 12/04/2011 1014   NITRITE NEGATIVE 01/08/2021 [place] 01/08/2021 1045     Ripudeep Rai M.D. [facility] 01/10/2021, 4:10 PM  Available via Epic secure chat 7am-7pm After 7 pm, please refer to night coverage provider listed on amion.

## 2021-01-10 NOTE — Progress Notes (Signed)
Writer made provider aware of pt abnormal b/p

## 2021-01-10 NOTE — Progress Notes (Signed)
[facility] Progress Note     Subjective: CC:  Reports feeling slightly better - less bloated and less abdominal pain. Reports having multiple loose BMs but does have to strain to pass them. Denies nausea.   Objective: Vital signs in last 24 hours: Temp:  [97.8 F (36.6 C)-98.3 F (36.8 C)] 98.3 F (36.8 C) (03/10 1314) Pulse Rate:  [79-100] 100 (03/10 1314) Resp:  [16-18] 18 (03/10 1314) BP: (121-145)/(84-96) 121/84 (03/10 1314) SpO2:  [100 %] 100 % (03/10 1314) Last BM Date: 01/10/21  Intake/Output from previous day: 03/09 0701 - 03/10 0700 In: -  Out: 300 [Emesis/NG output:300] Intake/Output this shift: No intake/output data recorded.  PE: Gen:  Alert, NAD, pleasant Card:  Regular rate and rhythm, pedal pulses 2+ BL Pulm:  Normal effort, clear to auscultation bilaterally Abd: Soft, less distended compared to yesterdays exam, non-tender, high-pitched tinkering bowel sounds present in upper abdomen  Lab Results:  Recent Labs    01/08/21 0618 01/09/21 0512  WBC 13.7* 9.3  HGB 14.3 13.4  HCT 43.4 41.1  PLT 329 257   BMET Recent Labs    01/08/21 0618 01/09/21 0512  NA 132* 137  K 2.4* 4.1  CL 95* 104  CO2 22 23  GLUCOSE 135* 86  BUN 24* 20  CREATININE 1.00 0.76  CALCIUM 7.8* 8.2*   PT/INR No results for input(s): LABPROT, INR in the last 72 hours. CMP     Component Value Date/Time   NA 137 01/09/2021 0512   NA 140 12/18/2020 1548   K 4.1 01/09/2021 0512   CL 104 01/09/2021 0512   CO2 23 01/09/2021 0512   GLUCOSE 86 01/09/2021 0512   BUN 20 01/09/2021 0512   BUN 11 12/18/2020 1548   CREATININE 0.76 01/09/2021 0512   CREATININE 0.94 05/08/2016 0741   CALCIUM 8.2 (L) 01/09/2021 0512   PROT 5.2 (L) 01/09/2021 0512   PROT 6.2 12/18/2020 1548   ALBUMIN 2.5 (L) 01/09/2021 0512   ALBUMIN 3.6 (L) 12/18/2020 1548   AST 20 01/09/2021 0512   ALT 22 01/09/2021 0512   ALKPHOS 50 01/09/2021 0512   BILITOT 1.1 01/09/2021 0512   BILITOT 0.7  12/18/2020 1548   GFRNONAA >60 01/09/2021 0512   GFRAA 77 12/18/2020 1548   Lipase     Component Value Date/Time   LIPASE 22 01/08/2021 0618       Studies/Results: DG Abd Portable 1V  Result Date: 01/10/2021 CLINICAL DATA:  Recurrent small-bowel obstruction with nausea and vomiting EXAM: PORTABLE ABDOMEN - 1 VIEW COMPARISON:  01/08/2021 FINDINGS: NG tube in the body of the stomach unchanged. Multiple dilated small bowel loops unchanged. Colon decompressed. Gas in the rectum. IMPRESSION: Dilated small bowel loops compatible with small bowel obstruction. NG tube in the stomach. No change from 2 days prior Electronically Signed   By: Franchot Gallo M.D.   On: 01/10/2021 11:12   DG Abd Portable 1V-Small Bowel Obstruction Protocol-initial, 8 hr delay  Result Date: 01/08/2021 CLINICAL DATA:  Small-bowel obstruction EXAM: PORTABLE ABDOMEN - 1 VIEW COMPARISON:  Plain film from earlier in the same day. FINDINGS: Scattered large and small bowel gas is noted. Persistent small bowel obstructive changes are seen. Contrast material is been administered and passed into the proximal small bowel. Some contrast is noted within the more distal small bowel as well although no definitive colonic contrast is noted at this time. Continued follow-up is recommended. Contrast from prior CT is noted. IMPRESSION: Persistent small-bowel obstructive change. Contrast  is noted within the small bowel although no definitive colonic contrast is seen at this time. Electronically Signed   By: Inez Catalina M.D.   On: 01/08/2021 21:22    Anti-infectives: Anti-infectives (From admission, onward)   Start     Dose/Rate Route Frequency Ordered Stop   01/08/21 2000  metroNIDAZOLE (FLAGYL) IVPB 500 mg        500 mg 100 mL/hr over 60 Minutes Intravenous Every 8 hours 01/08/21 1737     01/08/21 1830  ciprofloxacin (CIPRO) IVPB 400 mg        400 mg 200 mL/hr over 60 Minutes Intravenous Every 12 hours 01/08/21 1737        Assessment/Plan Hypertension Tobacco abuse -none for 30 days GERD Anxiety/depression Weight loss/severe deconditioning  Recurrent  SBO Crohn's disease with tight stricture mid to lower central pelvis, 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 - last colonoscopy 08/2019  - clinically pSBO improved with +BMs and decreased NG output. NG clamp trial started this morning around 0900. KUB performed around 1100 shows no significant improvement in PSBO pattern.  - clinically improving, no improvement on X-ray. Hopefully patient can improve with non-op mgmt, steroids, and tolerate FLD. Failure to improve may warrant surgery.   FEN: N.p.o./IV fluid, NG clamp trial  ID: None DVT: SCDs/per medicine   LOS: 2 days    Obie Dredge, [facility] Please see Amion for pager number during day hours 7:00am-4:30pm

## 2021-01-11 DIAGNOSIS — I10 Essential (primary) hypertension: Secondary | ICD-10-CM

## 2021-01-11 MED ORDER — METHYLPREDNISOLONE SODIUM SUCC 40 MG IJ SOLR
15.0000 mg | Freq: Two times a day (BID) | INTRAMUSCULAR | Status: DC
  Administered 2021-01-11 – 2021-01-12 (×2): 15.2 mg via INTRAVENOUS
  Filled 2021-01-11 (×2): qty 1

## 2021-01-11 MED ORDER — ENSURE ENLIVE PO LIQD
237.0000 mL | Freq: Two times a day (BID) | ORAL | Status: DC
  Administered 2021-01-11 – 2021-01-13 (×5): 237 mL via ORAL

## 2021-01-11 NOTE — Progress Notes (Signed)
Initial Nutrition Assessment  INTERVENTION:   -Ensure Enlive po BID, each supplement provides 350 kcal and 20 grams of protein  -Handouts placed in discharge instructions  -Will follow-up with patient if remains admitted through weekend  NUTRITION DIAGNOSIS:   Increased nutrient needs related to  (Crohn's disease) as evidenced by estimated needs.  GOAL:   Patient will meet greater than or equal to 90% of their needs  MONITOR:   PO intake,Supplement acceptance,Labs,Weight trends,I & O's  REASON FOR ASSESSMENT:   Consult Diet education  ASSESSMENT:   61 year old female with history of Crohn's disease, hypertension, recurrent SBO.  Presented with abdominal pain, N/V. She has a recent hospitalization in February 2022. 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 She was able to recover with NGT decompression and bowel rest. She states that a few days after discharge she had an epigastric pain that felt like a sharp rock and then turned into "rumbling and gurgling". She spoke with her GI doc (Dr. Fuller Plan). She was given prednisone, stelara, hyoscyamine, and zofran. It help initially, but then no longer provided relief. She states for the last week or so, she had just suffered with the pain. She had been unable to keep anything down. Admitted for SBO.  RD was unable to speak with patient at this time. Consult received for education regarding Crohn's disease diet.  Per chart review, pt has been educated on diet in the past. Will place handouts in discharge instructions. Will see pt in person once RDs back on campus Monday if pt is still admitted.  NGT was removed this AM. Diet was advanced to clears for breakfast, now on full liquids.  Ensure has been ordered.  Per weight records, pt has lost 34 lbs since 08/03/20 (20% wt loss x 5 months, significant for time frame). Suspect some degree of malnutrition given continued weight  loss.  Medications reviewed.  Labs reviewed.  NUTRITION - FOCUSED PHYSICAL EXAM:  Unable to complete  Diet Order:   Diet Order            Diet full liquid Room service appropriate? Yes; Fluid consistency: Thin  Diet effective now                 EDUCATION NEEDS:   Education needs have been addressed  Skin:  Skin Assessment: Reviewed RN Assessment  Last BM:  3/10  Height:   Ht Readings from Last 1 Encounters:  01/08/21 5' 3.5" (1.613 m)    Weight:   Wt Readings from Last 1 Encounters:  01/08/21 60.9 kg    BMI:  Body mass index is 23.41 kg/m.  Estimated Nutritional Needs:   Kcal:  1550-1750  Protein:  75-90g  Fluid:  1.7L/day  Clayton Bibles, MS, RD, LDN Inpatient Clinical Dietitian Contact information available via Amion

## 2021-01-11 NOTE — Progress Notes (Signed)
[facility]                                                                              Patient Demographics  Cynthia [name], is a 60 y.o. female, DOB - 08-26-1961, NTI:[id number]  Admit date - 01/08/2021   Admitting Physician Jonnie Finner, DO  Outpatient Primary MD for the patient is Denita Lung, MD  Outpatient specialists:   LOS - 3  days   Medical records reviewed and are as summarized below:    Chief Complaint  Patient presents with  . Abdominal Pain       Brief summary   Patient is a 60 year old female with history of Crohn's disease, hypertension, recurrent SBO.  Presented with abdominal pain, N/V. She has a recent hospitalization in February 2022. 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 She was able to recover with NGT decompression and bowel rest. She states that a few days after discharge she had an epigastric pain that felt like a sharp rock and then turned into "rumbling and gurgling". She spoke with her GI doc (Dr. Fuller Plan). She was given prednisone, stelara, hyoscyamine, and zofran. It help initially, but then no longer provided relief. She states for the last week or so, she had just suffered with the pain. She had been unable to keep anything down. She decided that she could not handle any more, so she came to the ED. She denies any other aggravating or alleviating factors.  ED Course: CT showed high-grade stricture and SBO. She was started on SBO protocol. General surgery was consulted.    Assessment & Plan    Principal Problem:   SBO (small bowel obstruction) (HCC) presented with nausea and vomiting - In the setting of recurrent SBO, Crohn's disease, possibly has a stricture or adhesions - General surgery and GI consulted.  Patient was placed on n.p.o. status and NG tube to suction -Feeling better, less bloated and less abdominal pain.  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 -CRP 0.5, ESR 0 -GI following, placed on IV ciprofloxacin and Flagyl, IV Solu-Medrol -Solu-Medrol decreased, patient is now ambulating, hopefully will tolerate diet    GERD - Currently stable -Continue PPI    Hypertension -BP stable, continue to hold losartan, HCTZ for now  History of depression, anxiety -Hold oral Effexor   Code Status: Full code DVT Prophylaxis:  SCDs Start: 01/08/21 1301, ambulating   Level of Care: Level of care: Telemetry Family Communication: Discussed all imaging results, lab results, explained to the patient    Disposition Plan:     Status is: Inpatient  Remains inpatient appropriate because:Inpatient level of care appropriate due to severity of illness   Dispo: The patient is from: Home              Anticipated d/c is to: Home              Patient currently is not medically stable to d/c.  Started on  full liquid diet, hopefully DC home in a.m. if no acute issues and tolerating diet   Difficult to place patient No      Time Spent in minutes 35 minutes  Procedures:  NG tube  Consultants:   General surgery GI  Antimicrobials:   Anti-infectives (From admission, onward)   Start     Dose/Rate Route Frequency Ordered Stop   01/08/21 2000  metroNIDAZOLE (FLAGYL) IVPB 500 mg        500 mg 100 mL/hr over 60 Minutes Intravenous Every 8 hours 01/08/21 1737     01/08/21 1830  ciprofloxacin (CIPRO) IVPB 400 mg        400 mg 200 mL/hr over 60 Minutes Intravenous Every 12 hours 01/08/21 1737           Medications  Scheduled Meds: . feeding supplement  237 mL Oral BID BM  . methylPREDNISolone (SOLU-MEDROL) injection  15.2 mg Intravenous Q12H  . pantoprazole (PROTONIX) IV  40 mg Intravenous Q24H   Continuous Infusions: . sodium chloride 100 mL/hr at 01/10/21 1722  . ciprofloxacin 400 mg (01/11/21  0543)  . metronidazole 500 mg (01/11/21 1212)   PRN Meds:.acetaminophen **OR** acetaminophen, fentaNYL (SUBLIMAZE) injection, LORazepam, ondansetron **OR** ondansetron (ZOFRAN) IV, phenylephrine      Subjective:   Cynthia [name] was seen and examined today.  Feels better today after NG tube removed.  No acute nausea vomiting.  Having BMs and flatus no fevers or chills..  Objective:   Vitals:   01/10/21 2136 01/11/21 0049 01/11/21 0646 01/11/21 1325  BP: (!) 152/108 119/86 (!) 148/93 (!) 135/99  Pulse: 79 98 85 89  Resp: 16 16 18 17   Temp: 97.8 F (36.6 C) 98 F (36.7 C) 97.9 F (36.6 C) 97.9 F (36.6 C)  TempSrc: Oral Oral Oral Oral  SpO2: 100% 98% 95% 100%  Weight:      Height:        Intake/Output Summary (Last 24 hours) at 01/11/2021 1510 Last data filed at 01/11/2021 0600 Gross per 24 hour  Intake 1663.06 ml  Output 0 ml  Net 1663.06 ml     Wt Readings from Last 3 Encounters:  01/08/21 60.9 kg  12/25/20 61.5 kg  12/18/20 67 kg   Physical Exam  General: Alert and oriented x 3, NAD  Cardiovascular: S1 S2 clear, RRR. No pedal edema b/l  Respiratory: CTAB, no wheezing, rales or rhonchi  Gastrointestinal: Soft, nontender, nondistended, NBS  Ext: no pedal edema bilaterally  Neuro: no new deficits  Musculoskeletal: No cyanosis, clubbing  Skin: No rashes  Psych: Normal affect and demeanor, alert and oriented x3      Data Reviewed:  I have personally reviewed following labs and imaging studies  Micro Results Recent Results (from the past 240 hour(s))  Resp Panel by RT-PCR (Flu A&B, Covid) Nasopharyngeal Swab     Status: None   Collection Time: 01/08/21 10:45 AM   Specimen: Nasopharyngeal Swab; Nasopharyngeal(NP) swabs in vial transport medium  Result Value Ref Range Status   SARS Coronavirus 2 by RT PCR NEGATIVE NEGATIVE Final    Comment: (NOTE) SARS-CoV-2 target nucleic acids are NOT DETECTED.  The SARS-CoV-2 RNA is generally detectable in  upper respiratory specimens during the acute phase of infection. The lowest concentration of SARS-CoV-2 viral copies this assay can detect is 138 copies/mL. A negative result does not preclude SARS-Cov-2 infection and should not be used as the sole basis for treatment or other patient management decisions. A negative result may occur  with  improper specimen collection/handling, submission of specimen other than nasopharyngeal swab, presence of viral mutation(s) within the areas targeted by this assay, and inadequate number of viral copies(<138 copies/mL). A negative result must be combined with clinical observations, patient history, and epidemiological information. The expected result is Negative.  Fact Sheet for Patients:  EntrepreneurPulse.com.au  Fact Sheet for Healthcare Providers:  IncredibleEmployment.be  This test is no t yet approved or cleared by the [place] FDA and  has been authorized for detection and/or diagnosis of SARS-CoV-2 by FDA under an Emergency Use Authorization (EUA). This EUA will remain  in effect (meaning this test can be used) for the duration of the COVID-19 declaration under Section 564(b)(1) of the Act, 21 U.S.C.section 360bbb-3(b)(1), unless the authorization is terminated  or revoked sooner.       Influenza A by PCR NEGATIVE NEGATIVE Final   Influenza B by PCR NEGATIVE NEGATIVE Final    Comment: (NOTE) The Xpert Xpress SARS-CoV-2/FLU/RSV plus assay is intended as an aid in the diagnosis of influenza from Nasopharyngeal swab specimens and should not be used as a sole basis for treatment. Nasal washings and aspirates are unacceptable for Xpert Xpress SARS-CoV-2/FLU/RSV testing.  Fact Sheet for Patients: EntrepreneurPulse.com.au  Fact Sheet for Healthcare Providers: IncredibleEmployment.be  This test is not yet approved or cleared by the [place] FDA and has been  authorized for detection and/or diagnosis of SARS-CoV-2 by FDA under an Emergency Use Authorization (EUA). This EUA will remain in effect (meaning this test can be used) for the duration of the COVID-19 declaration under Section 564(b)(1) of the Act, 21 U.S.C. section 360bbb-3(b)(1), unless the authorization is terminated or revoked.  Performed at [facility], [place] [address] [place], [place] [address]     Radiology Reports CT Abdomen Pelvis W Contrast  Result Date: 01/08/2021 CLINICAL DATA:  Periumbilical abdominal pain. History of Crohn's disease. EXAM: CT ABDOMEN AND PELVIS WITH CONTRAST TECHNIQUE: Multidetector CT imaging of the abdomen and pelvis was performed using the standard protocol following bolus administration of intravenous contrast. CONTRAST:  161m OMNIPAQUE IOHEXOL 300 MG/ML  SOLN COMPARISON:  12/04/2020 FINDINGS: Lower chest: The lung bases are clear of acute process. No pleural effusion or pulmonary lesions. The heart is normal in size. No pericardial effusion. The distal esophagus and aorta are unremarkable. Hepatobiliary: No hepatic lesions or intrahepatic biliary dilatation. No common bile duct dilatation. Pancreas: Moderate pancreatic atrophy but no mass or inflammation. Spleen: Normal size.  No focal lesions. Adrenals/Urinary Tract: The adrenal glands and kidneys are unremarkable. The bladder is unremarkable. Stomach/Bowel: The stomach is unremarkable. The duodenum is unremarkable. High-grade small-bowel obstruction with markedly dilated fluid-filled loops of small bowel with air-fluid levels extending down into the pelvis. Tight stricture or adhesive process in the mid to lower central pelvis as demonstrated on the prior CT scan likely related to the patient's Crohn's disease. 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 Some associated mucosal enhancement could be local inflammatory  process or possible active Crohn's disease. No definite abscess. Second area of probable adhesive change the mid small bowel with definite caliber change centrally at the level of the iliac crest. The colon is relatively decompressed. Vascular/Lymphatic: The aorta is normal in caliber. No dissection. The branch vessels are patent. The major venous structures are patent. No mesenteric or retroperitoneal mass or adenopathy. Small scattered lymph nodes are noted. Reproductive: Surgically absent. Other:  No free air or free fluid collections to suggest perforation. Musculoskeletal: No significant bony findings. IMPRESSION: 1. High-grade distal small-bowel obstruction likely due to stricture or adhesive process in the mid to lower central pelvis as demonstrated on the prior CT scan. 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 Second area of probable adhesive change in the mid small bowel with definite caliber change centrally at the level of the iliac crest. 2. No free air or free fluid collections to suggest perforation. Electronically Signed   By: Marijo Sanes M.D.   On: 01/08/2021 09:15   DG Abd Portable 1V  Result Date: 01/10/2021 CLINICAL DATA:  Recurrent small-bowel obstruction with nausea and vomiting EXAM: PORTABLE ABDOMEN - 1 VIEW COMPARISON:  01/08/2021 FINDINGS: NG tube in the body of the stomach unchanged. Multiple dilated small bowel loops unchanged. Colon decompressed. Gas in the rectum. IMPRESSION: Dilated small bowel loops compatible with small bowel obstruction. NG tube in the stomach. No change from 2 days prior Electronically Signed   By: Franchot Gallo M.D.   On: 01/10/2021 11:12   DG Abd Portable 1V-Small Bowel Obstruction Protocol-initial, 8 hr delay  Result Date: 01/08/2021 CLINICAL DATA:  Small-bowel obstruction EXAM: PORTABLE ABDOMEN - 1 VIEW COMPARISON:  Plain film from earlier in the same day. FINDINGS: Scattered large and small bowel gas is noted.  Persistent small bowel obstructive changes are seen. Contrast material is been administered and passed into the proximal small bowel. Some contrast is noted within the more distal small bowel as well although no definitive colonic contrast is noted at this time. Continued follow-up is recommended. Contrast from prior CT is noted. IMPRESSION: Persistent small-bowel obstructive change. Contrast is noted within the small bowel although no definitive colonic contrast is seen at this time. Electronically Signed   By: Inez Catalina M.D.   On: 01/08/2021 21:22   DG Abd Portable 1V-Small Bowel Protocol-Position Verification  Result Date: 01/08/2021 CLINICAL DATA:  Nasogastric tube placement. Crohn's disease. Abdominal pain and nausea EXAM: PORTABLE ABDOMEN - 1 VIEW COMPARISON:  CT abdomen and pelvis January 08, 2021; abdominal radiograph December 04, 2020 FINDINGS: Nasogastric tube tip is at the level of the proximal gastric cardia. Side port is above the gastroesophageal junction. Multiple loops of dilated bowel again noted. No free air evident on supine examination. Lung bases clear. IMPRESSION: Nasogastric tube tip in proximal gastric cardia. Side port above the gastroesophageal junction. Advise advancing nasogastric tube approximately 8-10 cm. Dilated bowel remains. No free air evident on supine examination. Electronically Signed   By: Lowella Grip III M.D.   On: 01/08/2021 11:18    Lab Data:  CBC: Recent Labs  Lab 01/08/21 0618 01/09/21 0512  WBC 13.7* 9.3  HGB 14.3 13.4  HCT 43.4 41.1  MCV 87.5 89.9  PLT 329 537   Basic Metabolic Panel: Recent Labs  Lab 01/08/21 0618 01/09/21 0512  NA 132* 137  K 2.4* 4.1  CL 95* 104  CO2 22 23  GLUCOSE 135* 86  BUN 24* 20  CREATININE 1.00 0.76  CALCIUM 7.8* 8.2*  MG 2.1  --   PHOS 2.4*  --    GFR: Estimated Creatinine Clearance: 62.5 mL/min (by C-G formula based on SCr of 0.76 mg/dL). Liver Function Tests: Recent Labs  Lab 01/08/21 0618  01/09/21 0512  AST 23 20  ALT 22 22  ALKPHOS 56 50  BILITOT 1.7*  1.8* 1.1  PROT 5.7* 5.2*  ALBUMIN 2.8* 2.5*  Recent Labs  Lab 01/08/21 0618  LIPASE 22   No results for input(s): AMMONIA in the last 168 hours. Coagulation Profile: No results for input(s): INR, PROTIME in the last 168 hours. Cardiac Enzymes: No results for input(s): CKTOTAL, CKMB, CKMBINDEX, TROPONINI in the last 168 hours. BNP (last 3 results) No results for input(s): PROBNP in the last 8760 hours. HbA1C: No results for input(s): HGBA1C in the last 72 hours. CBG: No results for input(s): GLUCAP in the last 168 hours. Lipid Profile: No results for input(s): CHOL, HDL, LDLCALC, TRIG, CHOLHDL, LDLDIRECT in the last 72 hours. Thyroid Function Tests: No results for input(s): TSH, T4TOTAL, FREET4, T3FREE, THYROIDAB in the last 72 hours. Anemia Panel: No results for input(s): VITAMINB12, FOLATE, FERRITIN, TIBC, IRON, RETICCTPCT in the last 72 hours. Urine analysis:    Component Value Date/Time   COLORURINE YELLOW 01/08/2021 1045   APPEARANCEUR CLEAR 01/08/2021 1045   LABSPEC >1.046 (H) 01/08/2021 1045   LABSPEC 1,025 12/18/2020 1644   PHURINE 6.0 01/08/2021 1045   GLUCOSEU NEGATIVE 01/08/2021 1045   HGBUR NEGATIVE 01/08/2021 1045   BILIRUBINUR NEGATIVE 01/08/2021 1045   BILIRUBINUR small (A) 12/18/2020 1644   BILIRUBINUR n 12/04/2011 1014   KETONESUR 20 (A) 01/08/2021 1045   PROTEINUR NEGATIVE 01/08/2021 1045   UROBILINOGEN negative 12/04/2011 1014   NITRITE NEGATIVE 01/08/2021 [place] 01/08/2021 1045     Chapin Arduini M.D. [facility] 01/11/2021, 3:10 PM  Available via Epic secure chat 7am-7pm After 7 pm, please refer to night coverage provider listed on amion.

## 2021-01-11 NOTE — Progress Notes (Signed)
[facility] Progress Note     Subjective: CC:  Reports feeling slightly better - less bloated and less abdominal pain. Reports having multiple loose BMs but does have to strain to pass them. Denies nausea.   Objective: Vital signs in last 24 hours: Temp:  [96.5 F (35.8 C)-98.3 F (36.8 C)] 97.9 F (36.6 C) (03/11 0646) Pulse Rate:  [79-100] 85 (03/11 0646) Resp:  [10-18] 18 (03/11 0646) BP: (119-152)/(84-108) 148/93 (03/11 0646) SpO2:  [95 %-100 %] 95 % (03/11 0646) Last BM Date: 01/10/21  Intake/Output from previous day: 03/10 0701 - 03/11 0700 In: 6878.3 [I.V.:4957; IV Piggyback:1921.3] Out: 0  Intake/Output this shift: No intake/output data recorded.  PE: Gen:  Alert, NAD, pleasant Card:  Regular rate and rhythm, pedal pulses 2+ BL Pulm:  Normal effort, clear to auscultation bilaterally Abd: Soft, less distended compared to yesterdays exam, non-tender, high-pitched tinkering bowel sounds present in upper abdomen  Lab Results:  Recent Labs    01/09/21 0512  WBC 9.3  HGB 13.4  HCT 41.1  PLT 257   BMET Recent Labs    01/09/21 0512  NA 137  K 4.1  CL 104  CO2 23  GLUCOSE 86  BUN 20  CREATININE 0.76  CALCIUM 8.2*   PT/INR No results for input(s): LABPROT, INR in the last 72 hours. CMP     Component Value Date/Time   NA 137 01/09/2021 0512   NA 140 12/18/2020 1548   K 4.1 01/09/2021 0512   CL 104 01/09/2021 0512   CO2 23 01/09/2021 0512   GLUCOSE 86 01/09/2021 0512   BUN 20 01/09/2021 0512   BUN 11 12/18/2020 1548   CREATININE 0.76 01/09/2021 0512   CREATININE 0.94 05/08/2016 0741   CALCIUM 8.2 (L) 01/09/2021 0512   PROT 5.2 (L) 01/09/2021 0512   PROT 6.2 12/18/2020 1548   ALBUMIN 2.5 (L) 01/09/2021 0512   ALBUMIN 3.6 (L) 12/18/2020 1548   AST 20 01/09/2021 0512   ALT 22 01/09/2021 0512   ALKPHOS 50 01/09/2021 0512   BILITOT 1.1 01/09/2021 0512   BILITOT 0.7 12/18/2020 1548   GFRNONAA >60 01/09/2021 0512   GFRAA 77 12/18/2020  1548   Lipase     Component Value Date/Time   LIPASE 22 01/08/2021 0618       Studies/Results: DG Abd Portable 1V  Result Date: 01/10/2021 CLINICAL DATA:  Recurrent small-bowel obstruction with nausea and vomiting EXAM: PORTABLE ABDOMEN - 1 VIEW COMPARISON:  01/08/2021 FINDINGS: NG tube in the body of the stomach unchanged. Multiple dilated small bowel loops unchanged. Colon decompressed. Gas in the rectum. IMPRESSION: Dilated small bowel loops compatible with small bowel obstruction. NG tube in the stomach. No change from 2 days prior Electronically Signed   By: Franchot Gallo M.D.   On: 01/10/2021 11:12    Anti-infectives: Anti-infectives (From admission, onward)   Start     Dose/Rate Route Frequency Ordered Stop   01/08/21 2000  metroNIDAZOLE (FLAGYL) IVPB 500 mg        500 mg 100 mL/hr over 60 Minutes Intravenous Every 8 hours 01/08/21 1737     01/08/21 1830  ciprofloxacin (CIPRO) IVPB 400 mg        400 mg 200 mL/hr over 60 Minutes Intravenous Every 12 hours 01/08/21 1737       Assessment/Plan Hypertension Tobacco abuse -none for 30 days GERD Anxiety/depression Weight loss/severe deconditioning  Recurrent  SBO Crohn's disease with tight stricture mid to lower central pelvis, 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  - last colonoscopy 08/2019  - clinically improving, noted persistent dilation on x-ray. Tolerated NG clamp trial for 24 hours. Having flatus and BMs. D/C NG tube and trial clear liquids.  - I have asked Dr. Havery Moros and he is adding this patient to the monthly crohn's disease conference discussions, next conference 3/22  FEN: CLD ID: None DVT: SCDs/per medicine   LOS: 3 days   Obie Dredge,  [facility] Surgery Please see Amion for pager number during day hours 7:00am-4:30pm

## 2021-01-11 NOTE — Discharge Instructions (Signed)
IBD Nutrition Therapy   If you have IBD, you might not be able to digest all the food you eat, so you may need more vitamins and minerals. Your medications might affect your ability to eat or how your body absorbs nutrients.  How much fiber you should eat depends on your symptoms and the amount of inflammation in your intestines. If you are taking prednisone or budesonide medications, then you should limit the amount of fiber that you are eating. If you are experiencing symptoms like diarrhea, abdominal pain, low-fiber foods are the easiest to digest and are less irritating for your intestines. If you don't have symptoms, then check with your health care provider or registered dietitian nutritionist (RDN) to see if you may add more fiber to your diet. It's also important to eat enough protein foods while you are on the IBD diet.  Tips Eat small meals or snacks every 3 or 4 hours. Do not skip meals. When you have symptoms, or if you are taking prednisone or budesonide, eat the foods in the Recommended Foods chart. These foods are lower in fiber. Eat a protein food or dairy product at every meal or snack if your body can tolerate it. See the Foods Recommended table for ideas. Drink a lot of fluids, at least 8 cups each day. Limit caffeinated, sugary drinks and beverages made with sugar substitutes. Eat foods that have probiotics (yogurt, kefir) and prebiotics (bananas). Ask your RDN for other suggestions. You may need to take supplements as part of your IBD treatment. Take a chewable multivitamin with minerals. Ask your RDN for recommendations. If you are taking methotrexate or sulfasalazine, take one multivitamin with minerals and a supplement with 1 milligram of folic acid daily. Take a chewable calcium supplement with vitamin D if you are not getting enough calcium from your diet. Check with your health care provider before starting probiotic or prebiotic supplements. When you don't have symptoms  (no blood in your stools), you are no longer taking prednisone or budesonide, and your inflammation is mild, your health care provider may recommend that you begin including whole grains and a variety of fruits and vegetables in your diet. Only add one to two new foods to your diet each week in small amounts and monitor your symptoms. Stop eating the new food if you develop abdominal pain or diarrhea. You can try it again after a few weeks  Low Fiber Nutrition Therapy   You may need a low-fiber diet if you have Crohn's disease, diverticulitis, gastroparesis, ulcerative colitis, a new colostomy, or new ileostomy. A low-fiber diet may also be needed following radiation therapy to the pelvis and lower bowel or recent intestinal surgery.  A low-fiber diet reduces the frequency and volume of your stools. This lessens irritation to the gastrointestinal (GI) tract and can help you heal. Use this diet if you have a stricture so your intestine doesn't get blocked. The goal of this diet is to get less than 8 grams of fiber daily. It's also important to eat enough protein foods while you are on a low-fiber diet.  Drink nutrition supplements that have 1 gram of fiber or less in each serving. If your stricture is severe or if your inflammation is severe, drink more liquids to reduce symptoms and to get enough calories and protein.  Tips . Eat about 5 to 6 small meals daily or about every 3 to 4 hours. Do not skip meals.  . Every time you eat, include a small  amount of protein (1 to 2 ounces) plus an additional food. Low fiber starch foods are the best choice to eat with protein.  . Limit acidic, spicy and high-fat or fried and greasy foods to reduce GI symptoms.  . Do not eat raw fruits and vegetables while on this diet. All fruits and vegetables need to be cooked and without peels or skins.  . Drink a lot of fluids, at least 8 cups of fluid each day. Limit drinks with caffeine, sugar, and sugar substitutes.   . Plain water is the best choice. Avoid mixing drink packets or flavor drops into water. .  . Take a chewable multivitamin with minerals. Gummy vitamins do not have enough minerals and can block an ostomy and non-chewable supplements are not easily digested. Chewable supplements must be used if you have a stricture or ostomy.  . If you are lactose intolerant, you may need to eat low-lactose dairy products. If you can't tolerate dairy, ask your RDN about how you can get enough calcium from other foods.  . Do not take a calcium supplement. They can cause a blockage.  . It is important to add high-calcium foods gradually to your diet and monitor for symptoms to avoid a blockage.  . Do not add more fiber to your diet until your health care provider or registered dietitian nutritionist (RDN) tells you it's OK. Fiber is part of whole grains, fruits and vegetables (foods from plants) and needs to be slowly added back in to your diet when your body is healed.  . Choose foods that have been safely handled and prepared to lower your risk of foodborne illness. Talk to your RDN or see the Food Safety Nutrition Therapy handout for more information.   Foods Recommended These foods are low in fat and fiber and will help with your GI symptoms. Food Group Foods Recommended  Grains  Choose grain foods with less than 2 grams of fiber per serving. Refined white flour products--for example, enriched white bread without seeds, crackers or pasta Cream of wheat or rice Grits (fine ground) Tortillas: white flour or corn White rice, well-cooked (do not rinse, or soak before cooking) Cold and hot cereals made from white or refined flour such as puffed rice or corn flakes  Protein Foods  Lean, very tender, well-cooked poultry or fish; red meats: beef, pork or lamb (slow cook until soft; chop meats if you have stricture or ostomy) Eggs, well-cooked Smooth nut butters such as almond, peanut, or sunflower Tofu  Dairy  If  you have lactose intolerance, drinking milk products from cows or goats may make diarrhea worse. Foods marked with an asterisk (*) have lactose. Milk: fat-free, 1% or 2% * (choose best tolerated) Lactose-free milk Buttermilk* Fortified non-dairy milks: almond, cashew, coconut, or rice (be aware that these options are not good sources of protein so you will need to eat an additional protein food) Kefir* (Don't include kefir in the diet until approved by your health care provider) Yogurt*/lactose-free yogurt (without nuts, fruit, granola or chocolate) Mild cheese* (hard and aged cheeses tend to be lower in lactose such as cheddar, swiss or parmesan) Cottage cheese* or lactose-free cottage cheese Low-fat ice cream* or lactose-free ice cream Sherbet* (usually lower lactose)  Vegetables  Canned and well-cooked vegetables without seeds, skins, or hulls  Carrots or green beans, cooked White, red or yellow potatoes without skins Strained vegetable juice  Fruit Soft, and well-cooked fruits without skins, seeds, or membranes Canned fruit in juice: peaches,  pears, or applesauce Fruit juice without pulp diluted by half with water may be tolerated better Fruit drinks fortified with vitamin C may be tolerated better than 100% fruit juice  Oils  When possible, choose healthy oils and fats, such as olive and canola oils, plant oils rather than solid fats.  Other  Broth and strained soups made from allowed foods Desserts (small portions) without whole grains, seeds, nuts, raisins, or coconut Jelly (clear)   Foods Not Recommended These foods are higher in fat and fiber and may make your GI symptoms worse.  Food Group Foods Not Recommended  Grains  Bread, whole wheat or with whole grain flour or seeds or nuts Brown rice, quinoa, kasha, barley Tortillas: whole grain Whole wheat pasta Whole grain and high-fiber cereals, including oatmeal, bran flakes or shredded wheat Popcorn  Protein Foods  Steak, pork  chops, or other meats that are fatty or have gristle Fried meat, poultry, or fish Seafood with a tough or rubbery texture, such as shrimp Luncheon meats such as bologna and salami Sausage, bacon, or hot dogs Dried beans, peas, or lentils Hummus Sushi Nuts and chunky nut butters  Dairy  Whole milk Pea milk and soymilk (may cause diarrhea, gas, bloating, and abdominal pain) Cream Half-and-half Sour cream Yogurt with added fruit, nuts, or granola or chocolate  Vegetables  Alfalfa or bean sprouts (high fiber and risk for bacteria) Raw or undercooked vegetables: beets; broccoli; brussels sprouts; cabbage; cauliflower; collard, mustard, or turnip greens; corn; cucumber; green peas or any kind of peas; kale; lima beans; mushrooms; okra; olives; pickles and relish; onions; parsnips; peppers; potato skins; sauerkraut; spinach; tomatoes  Fruit Raw fruit Dried fruit Avocado, berries, coconut Canned fruit in syrup Canned fruit with mandarin oranges, papaya or pineapple Fruit juice with pulp Prune juice Fruit skin  Oils  Pork rinds   Low-Fiber (8 grams) Sample 1-Day Menu  Breakfast  cup cream of wheat (0.5 gram fiber)  1 slice white toast (1 gram fiber)  1 teaspoon margarine, soft tub  2 scrambled eggs   Morning Snack 1 cup lactose-free nutrition supplement  Lunch 2 slices white bread (2 grams fiber)  3 tablespoons tuna  1 tablespoon mayonnaise  1 cup chicken noodle soup (1 gram fiber)   cup apple juice   Afternoon Snack 6 saltine crackers (0.5 gram fiber)  2 ounces low-fat cheddar cheese  Evening Meal 3 ounces tender chicken breast  1 cup white rice (0.5 gram fiber)   cup cooked canned green beans (2 grams fiber)   cup cranberry juice   Evening Snack 1 cup lactose-free nutrition supplement   Copyright 2020  [employer]    Copyright 2020  [employer]

## 2021-01-11 NOTE — Progress Notes (Addendum)
Progress Note  Chief Complaint:   Crohn's disease      Attending physician's note   I have taken an interval history, reviewed the chart and examined the patient. I agree with the Advanced Practitioner's note, impression and recommendations.    She is tolerating clear liquids.  NG tube was removed this morning. She is passing flatus and had a bowel movement mostly liquid stool. We will plan to continue to taper Solu-Medrol by 10 mg every day until reaches the dose 10 mg twice daily.   Plan to keep her on IV Solu-Medrol while she is inpatient, can transition to oral prednisone on discharge and will plan to slowly taper down by 5 mg every 2 weeks.  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  She is currently on maximal medical therapy.  We will arrange for outpatient GI follow-up in 2 to 3 weeks after discharge   I have spent 35 minutes of patient care (this includes precharting, chart review, review of results, face-to-face time used for counseling as well as treatment plan and follow-up. The patient was provided an opportunity to ask questions and all were answered. The patient agreed with the plan and demonstrated an understanding of the instructions.  Damaris Hippo , MD [phone]     ASSESSMENT / PLAN:    Brief history: 60 year old female with HTN, tobacco use, Crohn's ileocolitis with ileal stricture, enteroenteric fistula , on Stelara. Admitted with recurrent SBO. Recently admitted with SBO, saw Dr. Fuller Plan in follow up 2/22 and had run out of prednisone and was having obstructive symptoms again. She was restarted on Prednisone taper but presented to ED 01/08/21 with abdominal pain and vomiting. CT scan shows high grade distal SBO and  suspicionof small enteroenteric fistula in the area.   # Fistulizing Crohn's with recurrent SBO.  She got 3rd dose of Stelara on 12/17/20 (previously on Humira).  --She looks much better today. Tolerated NGT clamping yesterday and NGT was removed this am. She has just had several bowls of broth and so far no abdominal pain, distention or nausea. She was having bowel movements up until last night.  SBO initially thought to be from fibrotic stricturing ( still may be component) but she does seem to be opening up. No high pitched bowel sounds on exam today.  --Inflammatory markers are normal including CRP 0.5, ESR 0. --Will decrease Solumedrol to 15 mg BID   --Increased risk for DVT, continue SCDs and ambulation.  --Hopefully she tolerate eventual diet advancement       SUBJECTIVE:   She feels better today. No BMs today but was having some last pm. NGT removed a short while ago. She tolerated several bowls of broth this am.     OBJECTIVE:    Scheduled inpatient medications:  . feeding supplement  237 mL Oral BID BM  . methylPREDNISolone (SOLU-MEDROL) injection  20 mg Intravenous Q12H  . pantoprazole (PROTONIX) IV  40 mg Intravenous Q24H   Continuous inpatient infusions:  . sodium chloride 100 mL/hr at 01/10/21 1722  . ciprofloxacin 400 mg (01/11/21 0543)  . metronidazole 500 mg (01/11/21 0422)   PRN inpatient medications: acetaminophen **OR** acetaminophen, fentaNYL (SUBLIMAZE) injection, LORazepam, ondansetron **OR** ondansetron (ZOFRAN) IV, phenylephrine  Vital signs in  last 24 hours: Temp:  [96.5 F (35.8 C)-98.3 F (36.8 C)] 97.9 F (36.6 C) (03/11 0646) Pulse Rate:  [79-100] 85 (03/11 0646) Resp:  [10-18] 18 (03/11 0646) BP: (119-152)/(84-108) 148/93 (03/11 0646) SpO2:  [95 %-100 %] 95 % (03/11 0646) Last BM Date: 01/10/21  Intake/Output Summary (Last 24 hours) at 01/11/2021 1030 Last data filed at 01/11/2021 0600 Gross per 24 hour  Intake 6878.31 ml  Output 0 ml   Net 6878.31 ml     Physical Exam:  . General: Alert female in NAD . Heart:  Regular rate and rhythm. No lower extremity edema . Pulmonary: Normal respiratory effort . Abdomen: Soft, nondistended, nontender. Normal bowel sounds.  . Neurologic: Alert and oriented . Psych: Pleasant. Cooperative.   Filed Weights   01/08/21 1241  Weight: 60.9 kg    Intake/Output from previous day: 03/10 0701 - 03/11 0700 In: 6878.3 [I.V.:4957; IV Piggyback:1921.3] Out: 0  Intake/Output this shift: No intake/output data recorded.    Lab Results: Recent Labs    01/09/21 0512  WBC 9.3  HGB 13.4  HCT 41.1  PLT 257   BMET Recent Labs    01/09/21 0512  NA 137  K 4.1  CL 104  CO2 23  GLUCOSE 86  BUN 20  CREATININE 0.76  CALCIUM 8.2*   LFT Recent Labs    01/09/21 0512  PROT 5.2*  ALBUMIN 2.5*  AST 20  ALT 22  ALKPHOS 50  BILITOT 1.1   PT/INR No results for input(s): LABPROT, INR in the last 72 hours. Hepatitis Panel No results for input(s): HEPBSAG, HCVAB, HEPAIGM, HEPBIGM in the last 72 hours.  DG Abd Portable 1V  Result Date: 01/10/2021 CLINICAL DATA:  Recurrent small-bowel obstruction with nausea and vomiting EXAM: PORTABLE ABDOMEN - 1 VIEW COMPARISON:  01/08/2021 FINDINGS: NG tube in the body of the stomach unchanged. Multiple dilated small bowel loops unchanged. Colon decompressed. Gas in the rectum. IMPRESSION: Dilated small bowel loops compatible with small bowel obstruction. NG tube in the stomach. No change from 2 days prior Electronically Signed   By: Franchot Gallo M.D.   On: 01/10/2021 11:12    PREVIOUS ENDOSCOPIES:      Principal Problem:   SBO (small bowel obstruction) ([facility]CC) Active Problems:   GERD   Hypertension   Obesity (BMI 30-39.9)   Crohn disease ([place])     LOS: 3 days   Tye Savoy ,NP 01/11/2021, 10:30 AM

## 2021-01-12 DIAGNOSIS — Z8719 Personal history of other diseases of the digestive system: Secondary | ICD-10-CM

## 2021-01-12 DIAGNOSIS — E876 Hypokalemia: Secondary | ICD-10-CM

## 2021-01-12 DIAGNOSIS — F418 Other specified anxiety disorders: Secondary | ICD-10-CM

## 2021-01-12 DIAGNOSIS — D84821 Immunodeficiency due to drugs: Secondary | ICD-10-CM

## 2021-01-12 DIAGNOSIS — Z79899 Other long term (current) drug therapy: Secondary | ICD-10-CM

## 2021-01-12 DIAGNOSIS — K219 Gastro-esophageal reflux disease without esophagitis: Secondary | ICD-10-CM

## 2021-01-12 HISTORY — DX: Personal history of other diseases of the digestive system: Z87.19

## 2021-01-12 LAB — CBC
HCT: 36.4 % (ref 36.0–46.0)
Hemoglobin: 11.7 g/dL — ABNORMAL LOW (ref 12.0–15.0)
MCH: 29 pg (ref 26.0–34.0)
MCHC: 32.1 g/dL (ref 30.0–36.0)
MCV: 90.3 fL (ref 80.0–100.0)
Platelets: 181 10*3/uL (ref 150–400)
RBC: 4.03 MIL/uL (ref 3.87–5.11)
RDW: 13.8 % (ref 11.5–15.5)
WBC: 8.4 10*3/uL (ref 4.0–10.5)
nRBC: 0 % (ref 0.0–0.2)

## 2021-01-12 LAB — BASIC METABOLIC PANEL
Anion gap: 7 (ref 5–15)
BUN: <5 mg/dL — ABNORMAL LOW (ref 8–23)
CO2: 22 mmol/L (ref 22–32)
Calcium: 7.8 mg/dL — ABNORMAL LOW (ref 8.9–10.3)
Chloride: 108 mmol/L (ref 98–111)
Creatinine, Ser: 0.64 mg/dL (ref 0.44–1.00)
GFR, Estimated: >60 mL/min (ref >60)
Glucose, Bld: 102 mg/dL — ABNORMAL HIGH (ref 70–99)
Potassium: 3.1 mmol/L — ABNORMAL LOW (ref 3.5–5.1)
Sodium: 137 mmol/L (ref 135–145)

## 2021-01-12 MED ORDER — SODIUM CHLORIDE 0.9% FLUSH: 3.0000 mL | INTRAVENOUS | Status: DC | PRN

## 2021-01-12 MED ORDER — LOSARTAN POTASSIUM-HCTZ 100-12.5 MG PO TABS: 1.0000 | ORAL_TABLET | Freq: Every day | ORAL | Status: DC

## 2021-01-12 MED ORDER — CALCIUM POLYCARBOPHIL 625 MG PO TABS
625.0000 mg | ORAL_TABLET | Freq: Two times a day (BID) | ORAL | Status: DC
  Administered 2021-01-12 – 2021-01-13 (×2): 625 mg via ORAL
  Filled 2021-01-12 (×2): qty 1

## 2021-01-12 MED ORDER — POTASSIUM CHLORIDE 10 MEQ/100ML IV SOLN
10.0000 meq | INTRAVENOUS | Status: AC
  Administered 2021-01-12 (×4): 10 meq via INTRAVENOUS
  Filled 2021-01-12 (×3): qty 100

## 2021-01-12 MED ORDER — HYDROCHLOROTHIAZIDE 12.5 MG PO CAPS
12.5000 mg | ORAL_CAPSULE | Freq: Every day | ORAL | Status: DC
  Administered 2021-01-12 – 2021-01-13 (×2): 12.5 mg via ORAL
  Filled 2021-01-12 (×2): qty 1

## 2021-01-12 MED ORDER — SODIUM CHLORIDE 0.9% FLUSH
3.0000 mL | Freq: Two times a day (BID) | INTRAVENOUS | Status: DC
  Administered 2021-01-12: 3 mL via INTRAVENOUS

## 2021-01-12 MED ORDER — LOSARTAN POTASSIUM 50 MG PO TABS
100.0000 mg | ORAL_TABLET | Freq: Every day | ORAL | Status: DC
  Administered 2021-01-12 – 2021-01-13 (×2): 100 mg via ORAL
  Filled 2021-01-12 (×2): qty 2

## 2021-01-12 MED ORDER — METHYLPREDNISOLONE SODIUM SUCC 40 MG IJ SOLR
10.0000 mg | Freq: Two times a day (BID) | INTRAMUSCULAR | Status: DC
  Administered 2021-01-12 – 2021-01-13 (×2): 10 mg via INTRAVENOUS
  Filled 2021-01-12 (×2): qty 1

## 2021-01-12 MED ORDER — SODIUM CHLORIDE 0.9 % IV SOLN: 250.0000 mL | INTRAVENOUS | Status: DC | PRN

## 2021-01-12 NOTE — Progress Notes (Signed)
[facility] ROUNDING NOTE   Subjective: Feeling better.  She tolerated full liquid diet last night.  She is ordering soft diet for breakfast this morning.  Denies any abdominal pain, nausea or vomiting.   Objective: Vital signs in last 24 hours: Temp:  [97.3 F (36.3 C)-97.9 F (36.6 C)] 97.5 F (36.4 C) (03/12 0433) Pulse Rate:  [89] 89 (03/12 0433) Resp:  [16-17] 16 (03/12 0433) BP: (135-150)/(94-110) 150/110 (03/12 0433) SpO2:  [99 %-100 %] 99 % (03/12 0433) Last BM Date: 01/11/21 General: NAD  Intake/Output from previous day: 03/11 0701 - 03/12 0700 In: 2470.7 [P.O.:120; I.V.:1874.6; IV Piggyback:476.1] Out: -  Intake/Output this shift: No intake/output data recorded.   Lab Results: Recent Labs    01/12/21 0756  WBC 8.4  HGB 11.7*  PLT 181  MCV 90.3   BMET Recent Labs    01/12/21 0756  NA 137  K 3.1*  CL 108  CO2 22  GLUCOSE 102*  BUN <5*  CREATININE 0.64  CALCIUM 7.8*   LFT No results for input(s): PROT, ALBUMIN, AST, ALT, ALKPHOS, BILITOT, BILIDIR, IBILI in the last 72 hours. PT/INR No results for input(s): INR in the last 72 hours.    Imaging/Other results: No results found.    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 Patient is admitted with recurrent SBO, last admission was 4 weeks ago  She is tolerating full liquid diet.  Plan to advance to soft diet this morning She continues to tolerate soft diet, she may be okay to go home tomorrow  Patient will likely need surgical intervention for repair of enteroenteric fistula and high-grade small bowel stenosis to prevent recurrent episodes of SBO.  Will arrange for outpatient GI follow-up and referral to colorectal surgery  Decrease Solu-Medrol IV to 10 mg twice daily, transition to prednisone 10 mg twice daily on discharge  tomorrow if continues to improve clinically Decrease prednisone dose by 5 mg every 2 weeks  Can DC antibiotics on discharge  Please call with any questions   This visit required 25 minutes of patient care (this includes precharting, chart review, review of results, face-to-face time used for counseling as well as treatment plan and follow-up. The patient was provided an opportunity to ask questions and all were answered. The patient agreed with the plan and demonstrated an understanding of the instructions.    Damaris Hippo , MD [phone]  [facility]

## 2021-01-12 NOTE — Progress Notes (Signed)
   Subjective/Chief Complaint: Feels better.  No abdominal pain or nausea and vomiting.  Tolerating diet.   Objective: Vital signs in last 24 hours: Temp:  [97.3 F (36.3 C)-97.9 F (36.6 C)] 97.5 F (36.4 C) (03/12 0433) Pulse Rate:  [89] 89 (03/12 0433) Resp:  [16-17] 16 (03/12 0433) BP: (135-150)/(94-110) 150/110 (03/12 0433) SpO2:  [99 %-100 %] 99 % (03/12 0433) Last BM Date: 01/11/21  Intake/Output from previous day: 03/11 0701 - 03/12 0700 In: 2470.7 [P.O.:120; I.V.:1874.6; IV Piggyback:476.1] Out: -  Intake/Output this shift: No intake/output data recorded.   Gen:  Alert, NAD, pleasant Card:  Regular rate and rhythm, pedal pulses 2+ BL Pulm:  Normal effort, clear to auscultation bilaterally Abd: Soft, nontender with minimal distention  lab Results:  Recent Labs    01/12/21 0756  WBC 8.4  HGB 11.7*  HCT 36.4  PLT 181   BMET Recent Labs    01/12/21 0756  NA 137  K 3.1*  CL 108  CO2 22  GLUCOSE 102*  BUN <5*  CREATININE 0.64  CALCIUM 7.8*   PT/INR No results for input(s): LABPROT, INR in the last 72 hours. ABG No results for input(s): PHART, HCO3 in the last 72 hours.  Invalid input(s): PCO2, PO2  Studies/Results: No results found.  Anti-infectives: Anti-infectives (From admission, onward)   Start     Dose/Rate Route Frequency Ordered Stop   01/08/21 2000  metroNIDAZOLE (FLAGYL) IVPB 500 mg        500 mg 100 mL/hr over 60 Minutes Intravenous Every 8 hours 01/08/21 1737     01/08/21 1830  ciprofloxacin (CIPRO) IVPB 400 mg        400 mg 200 mL/hr over 60 Minutes Intravenous Every 12 hours 01/08/21 1737        Assessment/Plan:  Hypertension Tobacco abuse-none for 30 days GERD Anxiety/depression Weight loss/severe deconditioning  Recurrent SBO Crohn's disease with tight stricture mid to lower central pelvis, 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 Complicated Crohn disease involving  the distal ileum, with high-grade partial small bowel obstruction, evidence of small bowel to small bowel fistula and probable fistulous communication to the sigmoid colon.  - last colonoscopy 08/2019  - clinically improving, noted persistent dilation on x-ray. Tolerated diet  No pain or nausea / vomiting   No acute surgical need but may need outpatient evaluation by surgeon with Crohn's expertise for longer term plan    - I have asked Dr. Havery Moros and he is adding this patient to the monthly crohn's disease conference discussions, next conference 3/22  FEN: CLD ID: None DVT: SCDs/per medicine  LOS: 4 days    Joyice Faster Tiawana Forgy 01/12/2021

## 2021-01-12 NOTE — Progress Notes (Signed)
°   01/12/21 1311  Assess: if the MEWS score is Yellow or Red  Were vital signs taken at a resting state? Yes  Focused Assessment No change from prior assessment  Early Detection of Sepsis Score *See Row Information* Low  MEWS guidelines implemented *See Row Information* Yes

## 2021-01-12 NOTE — Progress Notes (Signed)
[facility]                                                                              Patient Demographics  Cynthia [name], is a 60 y.o. female, DOB - [birth year], KYH:[id number]  Admit date - 01/08/2021   Admitting Physician Jonnie Finner, DO  Outpatient Primary MD for the patient is Denita Lung, MD  Outpatient specialists:   LOS - 4  days   Medical records reviewed and are as summarized below:    Chief Complaint  Patient presents with  . Abdominal Pain       Brief summary   Patient is a 60 year old female with history of Crohn's disease, hypertension, recurrent SBO.  Presented with abdominal pain, N/V. She has a recent hospitalization in February 2022. 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 She was able to recover with NGT decompression and bowel rest. She states that a few days after discharge she had an epigastric pain that felt like a sharp rock and then turned into "rumbling and gurgling". She spoke with her GI doc (Dr. Fuller Plan). She was given prednisone, stelara, hyoscyamine, and zofran. It help initially, but then no longer provided relief. She states for the last week or so, she had just suffered with the pain. She had been unable to keep anything down. She decided that she could not handle any more, so she came to the ED. She denies any other aggravating or alleviating factors.  ED Course: CT showed high-grade stricture and SBO. She was started on SBO protocol. General surgery was consulted.    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 if continues to improve clinically.    GERD -Stable -Continue PPI    Hypertension -BP now elevated, resume losartan, HCTZ  History of depression, anxiety -Hold oral Effexor   Code Status: Full code DVT Prophylaxis:  SCDs Start: 01/08/21 1301, ambulating   Level of Care: Level of care: Telemetry Family Communication: Discussed all imaging results, lab results, explained to the patient    Disposition Plan:     Status is: Inpatient  Remains inpatient appropriate because:Inpatient level of care appropriate due to severity of illness   Dispo: The patient is from: Home              Anticipated d/c is to: Home              Patient currently is not medically stable to d/c.  Per GI, if tolerated soft diet today, can DC home in a.m.   difficult to place patient  No      Time Spent in minutes 35 minutes  Procedures:  NG tube  Consultants:   General surgery GI  Antimicrobials:   Anti-infectives (From admission, onward)   Start     Dose/Rate Route Frequency Ordered Stop   01/08/21 2000  metroNIDAZOLE (FLAGYL) IVPB 500 mg        500 mg 100 mL/hr over 60 Minutes Intravenous Every 8 hours 01/08/21 1737     01/08/21 1830  ciprofloxacin (CIPRO) IVPB 400 mg        400 mg 200 mL/hr over 60 Minutes Intravenous Every 12 hours 01/08/21 1737           Medications  Scheduled Meds: . feeding supplement  237 mL Oral BID BM  . methylPREDNISolone (SOLU-MEDROL) injection  10 mg Intravenous Q12H  . pantoprazole (PROTONIX) IV  40 mg Intravenous Q24H  . sodium chloride flush  3 mL Intravenous Q12H   Continuous Infusions: . sodium chloride    . ciprofloxacin 400 mg (01/11/21 1800)  . metronidazole 500 mg (01/12/21 1025)   PRN Meds:.sodium chloride, acetaminophen **OR**  acetaminophen, fentaNYL (SUBLIMAZE) injection, LORazepam, ondansetron **OR** ondansetron (ZOFRAN) IV, phenylephrine, sodium chloride flush      Subjective:   Teea Ducey was seen and examined today.  Feeling better, tolerated full liquid diet yesterday.  Having BMs, no fevers or chills.  BP somewhat elevated.  Objective:   Vitals:   01/11/21 0646 01/11/21 1325 01/11/21 2027 01/12/21 0433  BP: (!) 148/93 (!) 135/99 (!) 140/94 (!) 150/110  Pulse: 85 89 89 89  Resp: _0 Temp: 97.9 F (36.6 C) 97.9 F (36.6 C) (!) 97.3 F (36.3 C) (!) 97.5 F (36.4 C)  TempSrc: Oral Oral Oral   SpO2: 95% 100% 100% 99%  Weight:      Height:        Intake/Output Summary (Last 24 hours) at 01/12/2021 1152 Last data filed at 01/12/2021 0300 Gross per 24 hour  Intake 2470.68 ml  Output --  Net 2470.68 ml     Wt Readings from Last 3 Encounters:  01/08/21 60.9 kg  12/25/20 61.5 kg  12/18/20 67 kg   Physical Exam  General: Alert and oriented x 3, NAD  Cardiovascular: S1 S2 clear, RRR. No pedal edema b/l  Respiratory: CTAB, no wheezing, rales or rhonchi  Gastrointestinal: Soft, nontender, nondistended, NBS  Ext: no pedal edema bilaterally  Neuro: no new deficits  Musculoskeletal: No cyanosis, clubbing  Skin: No rashes  Psych: Normal affect and demeanor, alert and oriented x3    Data Reviewed:  I have personally reviewed following labs and imaging studies  Micro Results Recent Results (from the past 240 hour(s))  Resp Panel by RT-PCR (Flu A&B, Covid) Nasopharyngeal Swab     Status: None   Collection Time: 01/08/21 10:45 AM   Specimen: Nasopharyngeal Swab; Nasopharyngeal(NP) swabs in vial transport medium  Result Value Ref Range Status   SARS Coronavirus 2 by RT PCR NEGATIVE NEGATIVE Final    Comment: (NOTE) SARS-CoV-2 target nucleic acids are NOT DETECTED.  The SARS-CoV-2 RNA is generally detectable in upper respiratory specimens during the acute phase of  infection. The lowest concentration of SARS-CoV-2 viral copies this assay can detect is 138 copies/mL. A negative result does not preclude SARS-Cov-2 infection and should not be used as the sole basis for treatment or other patient management decisions. A negative result may occur with  improper specimen collection/handling, submission of specimen other than nasopharyngeal swab,  presence of viral mutation(s) within the areas targeted by this assay, and inadequate number of viral copies(<138 copies/mL). A negative result must be combined with clinical observations, patient history, and epidemiological information. The expected result is Negative.  Fact Sheet for Patients:  EntrepreneurPulse.com.au  Fact Sheet for Healthcare Providers:  IncredibleEmployment.be  This test is no t yet approved or cleared by the [place] FDA and  has been authorized for detection and/or diagnosis of SARS-CoV-2 by FDA under an Emergency Use Authorization (EUA). This EUA will remain  in effect (meaning this test can be used) for the duration of the COVID-19 declaration under Section 564(b)(1) of the Act, 21 U.S.C.section 360bbb-3(b)(1), unless the authorization is terminated  or revoked sooner.       Influenza A by PCR NEGATIVE NEGATIVE Final   Influenza B by PCR NEGATIVE NEGATIVE Final    Comment: (NOTE) The Xpert Xpress SARS-CoV-2/FLU/RSV plus assay is intended as an aid in the diagnosis of influenza from Nasopharyngeal swab specimens and should not be used as a sole basis for treatment. Nasal washings and aspirates are unacceptable for Xpert Xpress SARS-CoV-2/FLU/RSV testing.  Fact Sheet for Patients: EntrepreneurPulse.com.au  Fact Sheet for Healthcare Providers: IncredibleEmployment.be  This test is not yet approved or cleared by the [place] FDA and has been authorized for detection and/or diagnosis of SARS-CoV-2  by FDA under an Emergency Use Authorization (EUA). This EUA will remain in effect (meaning this test can be used) for the duration of the COVID-19 declaration under Section 564(b)(1) of the Act, 21 U.S.C. section 360bbb-3(b)(1), unless the authorization is terminated or revoked.  Performed at [facility], [place] [address] [place], [place] [address]     Radiology Reports CT Abdomen Pelvis W Contrast  Result Date: 01/08/2021 CLINICAL DATA:  Periumbilical abdominal pain. History of Crohn's disease. EXAM: CT ABDOMEN AND PELVIS WITH CONTRAST TECHNIQUE: Multidetector CT imaging of the abdomen and pelvis was performed using the standard protocol following bolus administration of intravenous contrast. CONTRAST:  152m OMNIPAQUE IOHEXOL 300 MG/ML  SOLN COMPARISON:  12/04/2020 FINDINGS: Lower chest: The lung bases are clear of acute process. No pleural effusion or pulmonary lesions. The heart is normal in size. No pericardial effusion. The distal esophagus and aorta are unremarkable. Hepatobiliary: No hepatic lesions or intrahepatic biliary dilatation. No common bile duct dilatation. Pancreas: Moderate pancreatic atrophy but no mass or inflammation. Spleen: Normal size.  No focal lesions. Adrenals/Urinary Tract: The adrenal glands and kidneys are unremarkable. The bladder is unremarkable. Stomach/Bowel: The stomach is unremarkable. The duodenum is unremarkable. High-grade small-bowel obstruction with markedly dilated fluid-filled loops of small bowel with air-fluid levels extending down into the pelvis. Tight stricture or adhesive process in the mid to lower central pelvis as demonstrated on the prior CT scan likely related to the patient's Crohn's disease. 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 Some associated mucosal enhancement could be local inflammatory process or possible active Crohn's disease. No definite  abscess. Second area of probable adhesive change the mid small bowel with definite caliber change centrally at the level of the iliac crest. The colon is relatively decompressed. Vascular/Lymphatic: The aorta is normal in caliber. No dissection. The branch vessels are patent. The major venous structures are patent. No mesenteric or retroperitoneal mass or adenopathy. Small scattered lymph nodes are noted. Reproductive: Surgically absent. Other: No free air or free fluid collections to suggest perforation. Musculoskeletal: No  significant bony findings. IMPRESSION: 1. High-grade distal small-bowel obstruction likely due to stricture or adhesive process in the mid to lower central pelvis as demonstrated on the prior CT scan. 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 Second area of probable adhesive change in the mid small bowel with definite caliber change centrally at the level of the iliac crest. 2. No free air or free fluid collections to suggest perforation. Electronically Signed   By: Marijo Sanes M.D.   On: 01/08/2021 09:15   DG Abd Portable 1V  Result Date: 01/10/2021 CLINICAL DATA:  Recurrent small-bowel obstruction with nausea and vomiting EXAM: PORTABLE ABDOMEN - 1 VIEW COMPARISON:  01/08/2021 FINDINGS: NG tube in the body of the stomach unchanged. Multiple dilated small bowel loops unchanged. Colon decompressed. Gas in the rectum. IMPRESSION: Dilated small bowel loops compatible with small bowel obstruction. NG tube in the stomach. No change from 2 days prior Electronically Signed   By: Franchot Gallo M.D.   On: 01/10/2021 11:12   DG Abd Portable 1V-Small Bowel Obstruction Protocol-initial, 8 hr delay  Result Date: 01/08/2021 CLINICAL DATA:  Small-bowel obstruction EXAM: PORTABLE ABDOMEN - 1 VIEW COMPARISON:  Plain film from earlier in the same day. FINDINGS: Scattered large and small bowel gas is noted. Persistent small bowel obstructive changes are seen.  Contrast material is been administered and passed into the proximal small bowel. Some contrast is noted within the more distal small bowel as well although no definitive colonic contrast is noted at this time. Continued follow-up is recommended. Contrast from prior CT is noted. IMPRESSION: Persistent small-bowel obstructive change. Contrast is noted within the small bowel although no definitive colonic contrast is seen at this time. Electronically Signed   By: Inez Catalina M.D.   On: 01/08/2021 21:22   DG Abd Portable 1V-Small Bowel Protocol-Position Verification  Result Date: 01/08/2021 CLINICAL DATA:  Nasogastric tube placement. Crohn's disease. Abdominal pain and nausea EXAM: PORTABLE ABDOMEN - 1 VIEW COMPARISON:  CT abdomen and pelvis January 08, 2021; abdominal radiograph December 04, 2020 FINDINGS: Nasogastric tube tip is at the level of the proximal gastric cardia. Side port is above the gastroesophageal junction. Multiple loops of dilated bowel again noted. No free air evident on supine examination. Lung bases clear. IMPRESSION: Nasogastric tube tip in proximal gastric cardia. Side port above the gastroesophageal junction. Advise advancing nasogastric tube approximately 8-10 cm. Dilated bowel remains. No free air evident on supine examination. Electronically Signed   By: Lowella Grip III M.D.   On: 01/08/2021 11:18    Lab Data:  CBC: Recent Labs  Lab 01/08/21 0618 01/09/21 0512 01/12/21 0756  WBC 13.7* 9.3 8.4  HGB 14.3 13.4 11.7*  HCT 43.4 41.1 36.4  MCV 87.5 89.9 90.3  PLT 329 257 885   Basic Metabolic Panel: Recent Labs  Lab 01/08/21 0618 01/09/21 0512 01/12/21 0756  NA 132* 137 137  K 2.4* 4.1 3.1*  CL 95* 104 108  CO2 _0 GLUCOSE 135* 86 102*  BUN 24* 20 <5*  CREATININE 1.00 0.76 0.64  CALCIUM 7.8* 8.2* 7.8*  MG 2.1  --   --   PHOS 2.4*  --   --    GFR: Estimated Creatinine Clearance: 62.5 mL/min (by C-G formula based on SCr of 0.64 mg/dL). Liver Function  Tests: Recent Labs  Lab 01/08/21 0618 01/09/21 0512  AST 23 20  ALT 22 22  ALKPHOS 56 50  BILITOT 1.7*  1.8* 1.1  PROT 5.7* 5.2*  ALBUMIN 2.8* 2.5*   Recent Labs  Lab 01/08/21 0618  LIPASE 22   No results for input(s): AMMONIA in the last 168 hours. Coagulation Profile: No results for input(s): INR, PROTIME in the last 168 hours. Cardiac Enzymes: No results for input(s): CKTOTAL, CKMB, CKMBINDEX, TROPONINI in the last 168 hours. BNP (last 3 results) No results for input(s): PROBNP in the last 8760 hours. HbA1C: No results for input(s): HGBA1C in the last 72 hours. CBG: No results for input(s): GLUCAP in the last 168 hours. Lipid Profile: No results for input(s): CHOL, HDL, LDLCALC, TRIG, CHOLHDL, LDLDIRECT in the last 72 hours. Thyroid Function Tests: No results for input(s): TSH, T4TOTAL, FREET4, T3FREE, THYROIDAB in the last 72 hours. Anemia Panel: No results for input(s): VITAMINB12, FOLATE, FERRITIN, TIBC, IRON, RETICCTPCT in the last 72 hours. Urine analysis:    Component Value Date/Time   COLORURINE YELLOW 01/08/2021 1045   APPEARANCEUR CLEAR 01/08/2021 1045   LABSPEC >1.046 (H) 01/08/2021 1045   LABSPEC 1,025 12/18/2020 1644   PHURINE 6.0 01/08/2021 1045   GLUCOSEU NEGATIVE 01/08/2021 1045   HGBUR NEGATIVE 01/08/2021 1045   BILIRUBINUR NEGATIVE 01/08/2021 1045   BILIRUBINUR small (A) 12/18/2020 1644   BILIRUBINUR n 12/04/2011 1014   KETONESUR 20 (A) 01/08/2021 1045   PROTEINUR NEGATIVE 01/08/2021 1045   UROBILINOGEN negative 12/04/2011 1014   NITRITE NEGATIVE 01/08/2021 [place] 01/08/2021 1045     Ancil Dewan M.D. [facility] 01/12/2021, 11:52 AM  Available via Epic secure chat 7am-7pm After 7 pm, please refer to night coverage provider listed on amion.

## 2021-01-12 NOTE — Plan of Care (Signed)
  Problem: Health Behavior/Discharge Planning: Goal: Ability to manage health-related needs will improve Outcome: Progressing   Problem: Activity: Goal: Risk for activity intolerance will decrease Outcome: Progressing   Problem: Nutrition: Goal: Adequate nutrition will be maintained Outcome: Progressing   Problem: Coping: Goal: Level of anxiety will decrease Outcome: Progressing   Problem: Elimination: Goal: Will not experience complications related to bowel motility Outcome: Progressing Goal: Will not experience complications related to urinary retention Outcome: Progressing   Problem: Safety: Goal: Ability to remain free from injury will improve Outcome: Progressing

## 2021-01-13 DIAGNOSIS — F17200 Nicotine dependence, unspecified, uncomplicated: Secondary | ICD-10-CM

## 2021-01-13 DIAGNOSIS — F418 Other specified anxiety disorders: Secondary | ICD-10-CM

## 2021-01-13 DIAGNOSIS — K50013 Crohn's disease of small intestine with fistula: Secondary | ICD-10-CM

## 2021-01-13 LAB — BASIC METABOLIC PANEL
Anion gap: 6 (ref 5–15)
BUN: 5 mg/dL — ABNORMAL LOW (ref 8–23)
CO2: 28 mmol/L (ref 22–32)
Calcium: 8.4 mg/dL — ABNORMAL LOW (ref 8.9–10.3)
Chloride: 104 mmol/L (ref 98–111)
Creatinine, Ser: 0.83 mg/dL (ref 0.44–1.00)
GFR, Estimated: >60 mL/min (ref >60)
Glucose, Bld: 86 mg/dL (ref 70–99)
Potassium: 3.4 mmol/L — ABNORMAL LOW (ref 3.5–5.1)
Sodium: 138 mmol/L (ref 135–145)

## 2021-01-13 LAB — MAGNESIUM: Magnesium: 1.3 mg/dL — ABNORMAL LOW (ref 1.7–2.4)

## 2021-01-13 LAB — POTASSIUM: Potassium: 3.2 mmol/L — ABNORMAL LOW (ref 3.5–5.1)

## 2021-01-13 LAB — PHOSPHORUS: Phosphorus: 1.4 mg/dL — ABNORMAL LOW (ref 2.5–4.6)

## 2021-01-13 LAB — CBC
HCT: 34.9 % — ABNORMAL LOW (ref 36.0–46.0)
Hemoglobin: 11.7 g/dL — ABNORMAL LOW (ref 12.0–15.0)
MCH: 29.7 pg (ref 26.0–34.0)
MCHC: 33.5 g/dL (ref 30.0–36.0)
MCV: 88.6 fL (ref 80.0–100.0)
Platelets: 218 10*3/uL (ref 150–400)
RBC: 3.94 MIL/uL (ref 3.87–5.11)
RDW: 13.8 % (ref 11.5–15.5)
WBC: 9.5 10*3/uL (ref 4.0–10.5)
nRBC: 0 % (ref 0.0–0.2)

## 2021-01-13 LAB — PREALBUMIN: Prealbumin: 12.6 mg/dL — ABNORMAL LOW (ref 18–38)

## 2021-01-13 MED ORDER — PREDNISONE 10 MG PO TABS: ORAL_TABLET | ORAL | 1 refills | Status: DC

## 2021-01-13 MED ORDER — ONDANSETRON 4 MG PO TBDP: 4.0000 mg | ORAL_TABLET | Freq: Three times a day (TID) | ORAL | 5 refills | Status: DC | PRN

## 2021-01-13 MED ORDER — CALCIUM POLYCARBOPHIL 625 MG PO TABS: 625.0000 mg | ORAL_TABLET | Freq: Two times a day (BID) | ORAL | 2 refills | Status: DC

## 2021-01-13 NOTE — Discharge Summary (Signed)
Physician Discharge Summary   Patient ID: Cynthia [name] MRN: [id number] DOB/AGE: 1961/09/21 60 y.o.  Admit date: 01/08/2021 Discharge date: 01/13/2021  Primary Care Physician:  Denita Lung, MD   Recommendations for Outpatient Follow-up:  1. Follow up with PCP in 1-2 weeks 2. Follow-up outpatient with GI, Dr. Fuller Plan in 2 weeks 3. General surgery recommended referral to colorectal specialist to assess her Crohn's disease and surgical options if necessary  Home Health: Patient ambulating without any difficulty Equipment/Devices:   Discharge Condition: stable CODE STATUS: FULL  Diet recommendation: Soft diet of full liquid diet as tolerated   Discharge Diagnoses:    .  SBO (small bowel obstruction) ([place]) -resolved . Crohn's ileitis, with intestinal obstruction ([place]) . 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[place]) . Essential hypertension . Gastroesophageal reflux disease . Obesity (BMI 30-39.9) . Current smoker . RLS (restless legs syndrome)   Consults:  general surgery  Gastroenterology    Allergies:   Allergies  Allergen Reactions  . Iodine     REACTION: in eye gtts Iodine eye drops; pt has no reactions to CT contrast     DISCHARGE MEDICATIONS: Allergies as of 01/13/2021      Reactions   Iodine    REACTION: in eye gtts Iodine eye drops; pt has no reactions to CT contrast      Medication List    TAKE these medications   diphenhydramine-acetaminophen 25-500 MG Tabs tablet Commonly known as: TYLENOL PM Take 2 tablets by mouth at bedtime as needed (sleep/pain).   esomeprazole 40 MG capsule Commonly known as: NEXIUM TAKE ONE (1) CAPSULE BY MOUTH 2 TIMES DAILY What changed:   how much to take  how to take this  when to take this  additional instructions   hyoscyamine 0.125 MG tablet Commonly known as: LEVSIN Take 2 tablets (0.25 mg total) by mouth every 4 (four) hours as needed for cramping.   losartan-hydrochlorothiazide 100-12.5 MG  tablet Commonly known as: HYZAAR Take 1 tablet by mouth daily.   ondansetron 4 MG disintegrating tablet Commonly known as: ZOFRAN-ODT Take 1 tablet (4 mg total) by mouth every 8 (eight) hours as needed for nausea or vomiting.   phenylephrine 1 % nasal spray Commonly known as: NEO-SYNEPHRINE Place 1 drop into both nostrils every 6 (six) hours as needed for congestion.   polycarbophil 625 MG tablet Commonly known as: FIBERCON Take 1 tablet (625 mg total) by mouth 2 (two) times daily.   pramipexole 1 MG tablet Commonly known as: MIRAPEX Take 1 tablet by mouth daily What changed:   how much to take  how to take this  when to take this  additional instructions   predniSONE 10 MG tablet Commonly known as: DELTASONE Please take prednisone 10 mg twice daily for 2 weeks. Decrease prednisone dose by 5 mg every 2 weeks What changed: additional instructions   venlafaxine XR 150 MG 24 hr capsule Commonly known as: EFFEXOR-XR TAKE 2 CAPSULES BY MOUTH EVERY DAY What changed:   how much to take  how to take this  when to take this  additional instructions        Brief H and P: For complete details please refer to admission H and P, but in brief *Patient is a 60 year old female with history of Crohn's disease, hypertension, recurrent SBO.  Presented with abdominal pain, N/V. She has a recent hospitalization in February 2022. 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  She was able to recover with NGT decompression and bowel rest. She states that a few days after discharge she had an epigastric pain that felt like a sharp rock and then turned into "rumbling and gurgling". She spoke with her GI doc (Dr. Fuller Plan). She was given prednisone, stelara, hyoscyamine, and zofran. It help initially, but then no longer provided relief. She states for the last week or so, she had just suffered with the pain. She had been unable to keep anything  down. She decided that she could not handle any more, so she came to the ED. She denies any other aggravating or alleviating factors.  ED Course:CT showed high-grade stricture and SBO. She was started on SBO protocol. General surgery was consulted.  Hospital Course:    SBO (small bowel obstruction) (HCC) presented with nausea and vomiting - In the setting of recurrent SBO, Crohn's disease, possibly has a stricture or adhesions Patient placed on n.p.o. status, IV fluids, NGT to suction.  Patient was followed closely by general surgery and GI. NGT has now been discontinued, patient is tolerating soft solids   Crohn's disease with complications, findings of enteroenteric fistula and adhesions in mid small bowel -CRP 0.5, ESR 0 -GI was consulted, patient was placed on IV ciprofloxacin, Flagyl, IV Solu-Medrol  -Upon discharge, placed on prednisone 10 mg twice a day, decrease prednisone dose by 5 mg every 2 weeks.  Patient will follow up with GI in 2 weeks     GERD -Stable -Continue PPI    Hypertension -Resumed losartan HCTZ  History of depression, anxiety -Continue Effexor  Day of Discharge S: No acute complaints, tolerating soft diet without any difficulty.  No fevers or chills.  No abdominal pain.  Wants to go home.  BP (!) 132/94 (BP Location: Right Arm)   Pulse 85   Temp (!) 97.4 F (36.3 C) (Oral)   Resp 16   Ht 5' 3.5" (1.613 m)   Wt 60.9 kg   LMP 06/10/2011 (Exact Date)   SpO2 100%   BMI 23.41 kg/m   Physical Exam: General: Alert and awake oriented x3 not in any acute distress. HEENT: anicteric sclera, pupils reactive to light and accommodation CVS: S1-S2 clear no murmur rubs or gallops Chest: clear to auscultation bilaterally, no wheezing rales or rhonchi Abdomen: soft nontender, nondistended, normal bowel sounds Extremities: no cyanosis, clubbing or edema noted bilaterally Neuro: no new deficits    Get Medicines reviewed and adjusted: Please take all  your medications with you for your next visit with your Primary MD  Please request your Primary MD to go over all hospital tests and procedure/radiological results at the follow up. Please ask your Primary MD to get all Hospital records sent to his/her office.  If you experience worsening of your admission symptoms, develop shortness of breath, life threatening emergency, suicidal or homicidal thoughts you must seek medical attention immediately by calling 911 or calling your MD immediately  if symptoms less severe.  You must read complete instructions/literature along with all the possible adverse reactions/side effects for all the Medicines you take and that have been prescribed to you. Take any new Medicines after you have completely understood and accept all the possible adverse reactions/side effects.   Do not drive when taking pain medications.   Do not take more than prescribed Pain, Sleep and Anxiety Medications  Special Instructions: If you have smoked or chewed Tobacco  in the last 2 yrs please stop smoking, stop any regular Alcohol  and  or any Recreational drug use.  Wear Seat belts while driving.  Please note  You were cared for by a hospitalist during your hospital stay. Once you are discharged, your primary care physician will handle any further medical issues. Please note that NO REFILLS for any discharge medications will be authorized once you are discharged, as it is imperative that you return to your primary care physician (or establish a relationship with a primary care physician if you do not have one) for your aftercare needs so that they can reassess your need for medications and monitor your lab values.   The results of significant diagnostics from this hospitalization (including imaging, microbiology, ancillary and laboratory) are listed below for reference.      Procedures/Studies:  CT Abdomen Pelvis W Contrast  Result Date: 01/08/2021 CLINICAL DATA:   Periumbilical abdominal pain. History of Crohn's disease. EXAM: CT ABDOMEN AND PELVIS WITH CONTRAST TECHNIQUE: Multidetector CT imaging of the abdomen and pelvis was performed using the standard protocol following bolus administration of intravenous contrast. CONTRAST:  139m OMNIPAQUE IOHEXOL 300 MG/ML  SOLN COMPARISON:  12/04/2020 FINDINGS: Lower chest: The lung bases are clear of acute process. No pleural effusion or pulmonary lesions. The heart is normal in size. No pericardial effusion. The distal esophagus and aorta are unremarkable. Hepatobiliary: No hepatic lesions or intrahepatic biliary dilatation. No common bile duct dilatation. Pancreas: Moderate pancreatic atrophy but no mass or inflammation. Spleen: Normal size.  No focal lesions. Adrenals/Urinary Tract: The adrenal glands and kidneys are unremarkable. The bladder is unremarkable. Stomach/Bowel: The stomach is unremarkable. The duodenum is unremarkable. High-grade small-bowel obstruction with markedly dilated fluid-filled loops of small bowel with air-fluid levels extending down into the pelvis. Tight stricture or adhesive process in the mid to lower central pelvis as demonstrated on the prior CT scan likely related to the patient's Crohn's disease. 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 Some associated mucosal enhancement could be local inflammatory process or possible active Crohn's disease. No definite abscess. Second area of probable adhesive change the mid small bowel with definite caliber change centrally at the level of the iliac crest. The colon is relatively decompressed. Vascular/Lymphatic: The aorta is normal in caliber. No dissection. The branch vessels are patent. The major venous structures are patent. No mesenteric or retroperitoneal mass or adenopathy. Small scattered lymph nodes are noted. Reproductive: Surgically absent. Other: No free air or free fluid  collections to suggest perforation. Musculoskeletal: No significant bony findings. IMPRESSION: 1. High-grade distal small-bowel obstruction likely due to stricture or adhesive process in the mid to lower central pelvis as demonstrated on the prior CT scan. 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 Second area of probable adhesive change in the mid small bowel with definite caliber change centrally at the level of the iliac crest. 2. No free air or free fluid collections to suggest perforation. Electronically Signed   By: PMarijo SanesM.D.   On: 01/08/2021 09:15   DG Abd Portable 1V  Result Date: 01/10/2021 CLINICAL DATA:  Recurrent small-bowel obstruction with nausea and vomiting EXAM: PORTABLE ABDOMEN - 1 VIEW COMPARISON:  01/08/2021 FINDINGS: NG tube in the body of the stomach unchanged. Multiple dilated small bowel loops unchanged. Colon decompressed. Gas in the rectum. IMPRESSION: Dilated small bowel loops compatible with small bowel obstruction. NG tube in the stomach. No change from 2 days prior Electronically  Signed   By: Franchot Gallo M.D.   On: 01/10/2021 11:12   DG Abd Portable 1V-Small Bowel Obstruction Protocol-initial, 8 hr delay  Result Date: 01/08/2021 CLINICAL DATA:  Small-bowel obstruction EXAM: PORTABLE ABDOMEN - 1 VIEW COMPARISON:  Plain film from earlier in the same day. FINDINGS: Scattered large and small bowel gas is noted. Persistent small bowel obstructive changes are seen. Contrast material is been administered and passed into the proximal small bowel. Some contrast is noted within the more distal small bowel as well although no definitive colonic contrast is noted at this time. Continued follow-up is recommended. Contrast from prior CT is noted. IMPRESSION: Persistent small-bowel obstructive change. Contrast is noted within the small bowel although no definitive colonic contrast is seen at this time. Electronically Signed   By: Inez Catalina  M.D.   On: 01/08/2021 21:22   DG Abd Portable 1V-Small Bowel Protocol-Position Verification  Result Date: 01/08/2021 CLINICAL DATA:  Nasogastric tube placement. Crohn's disease. Abdominal pain and nausea EXAM: PORTABLE ABDOMEN - 1 VIEW COMPARISON:  CT abdomen and pelvis January 08, 2021; abdominal radiograph December 04, 2020 FINDINGS: Nasogastric tube tip is at the level of the proximal gastric cardia. Side port is above the gastroesophageal junction. Multiple loops of dilated bowel again noted. No free air evident on supine examination. Lung bases clear. IMPRESSION: Nasogastric tube tip in proximal gastric cardia. Side port above the gastroesophageal junction. Advise advancing nasogastric tube approximately 8-10 cm. Dilated bowel remains. No free air evident on supine examination. Electronically Signed   By: Lowella Grip III M.D.   On: 01/08/2021 11:18       LAB RESULTS: Basic Metabolic Panel: Recent Labs  Lab 01/12/21 0756 01/13/21 0101 01/13/21 0801  NA 137  --  138  K 3.1* 3.2* 3.4*  CL 108  --  104  CO2 22  --  28  GLUCOSE 102*  --  86  BUN <5*  --  5*  CREATININE 0.64  --  0.83  CALCIUM 7.8*  --  8.4*  MG  --  1.3*  --   PHOS  --  1.4*  --    Liver Function Tests: Recent Labs  Lab 01/08/21 0618 01/09/21 0512  AST 23 20  ALT 22 22  ALKPHOS 56 50  BILITOT 1.7*  1.8* 1.1  PROT 5.7* 5.2*  ALBUMIN 2.8* 2.5*   Recent Labs  Lab 01/08/21 0618  LIPASE 22   No results for input(s): AMMONIA in the last 168 hours. CBC: Recent Labs  Lab 01/12/21 0756 01/13/21 0101  WBC 8.4 9.5  HGB 11.7* 11.7*  HCT 36.4 34.9*  MCV 90.3 88.6  PLT 181 218   Cardiac Enzymes: No results for input(s): CKTOTAL, CKMB, CKMBINDEX, TROPONINI in the last 168 hours. BNP: Invalid input(s): POCBNP CBG: No results for input(s): GLUCAP in the last 168 hours.     Disposition and Follow-up: Discharge Instructions    Increase activity slowly   Complete by: As directed         DISPOSITION: Home   DISCHARGE FOLLOW-UP  Follow-up Information    [name], MD. Schedule an appointment as soon as possible for a visit in 2 week(s).   Specialty: Gastroenterology Why: For hospital follow-up Contact information: [address] [place] [address] [phone]        Denita Lung, MD. Schedule an appointment as soon as possible for a visit in 2 week(s).   Specialty: Family Medicine Contact information: [phone] [facility]  [place] [address] [phone]                Time coordinating discharge:  35 minutes  Signed:   Estill Cotta M.D. [facility] 01/13/2021, 1:26 PM

## 2021-01-13 NOTE — Progress Notes (Signed)
Patient remains stable A&Ox4, ambulatory without assistance. No change from am assessment. Question, concerns regarding discharged were denied at this time.

## 2021-01-13 NOTE — Progress Notes (Signed)
Patient going home today.  She feels well and has no abdominal pain.  We will sign off.  She will need to see a colorectal specialist to reassess her Crohn's disease and surgical options if necessary

## 2021-01-16 ENCOUNTER — Telehealth: Payer: Self-pay

## 2021-01-16 NOTE — Telephone Encounter (Signed)
-----   Message from [name], MD sent at 01/15/2021  5:50 PM EDT ----- Regarding: RE: [facility] seems reluctant (or unwilling) to offer her surgery however she has failed medical mgmt. I don't know any [facility] colorectal surgeons however we need to find one to see her. Perhaps call the [facility] general surgery groups to find a colorectal surgeon. ----- Message ----- From: [name], RN Sent: 01/15/2021  11:20 AM EDT To: [name], MD Subject: RE:                                            I just saw Dr. Woodward Ku notes that [facility] did not want to do the surgery.   [facility] is the system locally besides [place] that take [facility].  ----- Message ----- From: [name], MD Sent: 01/13/2021  11:09 AM EDT To: [name], RN, Willia Craze, NP, # Subject: RE:                                            Barbera Setters,  Please set up office appt with me or PG in about 2 weeks and send referral to Dr. 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 Thanks, MS ----- Message ----- From: Mauri Pole, MD Sent: 01/12/2021  11:21 AM EDT To: [name], RN, [name], MD, #  Hi Sheri,  This patient has h/o Crohn's with high grade stenosis and enteroenteric fistula. She was admitted with recurrent SBO.  She will need office follow up in 2-4 weeks . She will likely go home this Sherlyn Lees, may need repeat CT enterography and will have to consider referral to colorectal surgery for elective repair of the high grade stenosis and fistula to prevent recurrent SBO. If [facility] doesn't want to do it, [facility] Mom may be willing to evaluate it.  Thanks [employer]

## 2021-01-16 NOTE — Telephone Encounter (Signed)
Patient is notified that referral plan has changed to [facility] due to insurance issues. Patient is aware that referral has been sent and that they will contact her directly with an appointment date and time.    [facility] [phone]  Fax [phone] and [phone]

## 2021-01-23 NOTE — Telephone Encounter (Signed)
Patient has been scheduled

## 2021-01-24 ENCOUNTER — Other Ambulatory Visit: Payer: Self-pay

## 2021-01-24 ENCOUNTER — Encounter (HOSPITAL_COMMUNITY): Payer: Self-pay

## 2021-01-24 ENCOUNTER — Emergency Department (HOSPITAL_COMMUNITY): Payer: 59

## 2021-01-24 ENCOUNTER — Encounter: Payer: Self-pay | Admitting: Surgery

## 2021-01-24 ENCOUNTER — Inpatient Hospital Stay (HOSPITAL_COMMUNITY)
Admission: EM | Admit: 2021-01-24 | Discharge: 2021-01-30 | DRG: 385 | Disposition: A | Discharge status: Home / Self Care | Payer: 59 | Attending: Internal Medicine | Admitting: Internal Medicine

## 2021-01-24 ENCOUNTER — Inpatient Hospital Stay: Payer: 59 | Admitting: Family Medicine

## 2021-01-24 DIAGNOSIS — Z9079 Acquired absence of other genital organ(s): Secondary | ICD-10-CM | POA: Diagnosis not present

## 2021-01-24 DIAGNOSIS — F419 Anxiety disorder, unspecified: Secondary | ICD-10-CM | POA: Diagnosis present

## 2021-01-24 DIAGNOSIS — D84821 Immunodeficiency due to drugs: Secondary | ICD-10-CM | POA: Diagnosis present

## 2021-01-24 DIAGNOSIS — Z888 Allergy status to other drugs, medicaments and biological substances status: Secondary | ICD-10-CM | POA: Diagnosis not present

## 2021-01-24 DIAGNOSIS — M7989 Other specified soft tissue disorders: Secondary | ICD-10-CM | POA: Diagnosis not present

## 2021-01-24 DIAGNOSIS — I10 Essential (primary) hypertension: Secondary | ICD-10-CM | POA: Diagnosis present

## 2021-01-24 DIAGNOSIS — T380X5A Adverse effect of glucocorticoids and synthetic analogues, initial encounter: Secondary | ICD-10-CM | POA: Diagnosis not present

## 2021-01-24 DIAGNOSIS — K50912 Crohn's disease, unspecified, with intestinal obstruction: Secondary | ICD-10-CM

## 2021-01-24 DIAGNOSIS — U071 COVID-19: Secondary | ICD-10-CM | POA: Diagnosis present

## 2021-01-24 DIAGNOSIS — K50112 Crohn's disease of large intestine with intestinal obstruction: Secondary | ICD-10-CM | POA: Diagnosis present

## 2021-01-24 DIAGNOSIS — K219 Gastro-esophageal reflux disease without esophagitis: Secondary | ICD-10-CM | POA: Diagnosis present

## 2021-01-24 DIAGNOSIS — G2581 Restless legs syndrome: Secondary | ICD-10-CM | POA: Diagnosis present

## 2021-01-24 DIAGNOSIS — Z90710 Acquired absence of both cervix and uterus: Secondary | ICD-10-CM | POA: Diagnosis not present

## 2021-01-24 DIAGNOSIS — E876 Hypokalemia: Secondary | ICD-10-CM | POA: Diagnosis present

## 2021-01-24 DIAGNOSIS — Z86010 Personal history of colonic polyps: Secondary | ICD-10-CM

## 2021-01-24 DIAGNOSIS — Z0189 Encounter for other specified special examinations: Secondary | ICD-10-CM

## 2021-01-24 DIAGNOSIS — Z716 Tobacco abuse counseling: Secondary | ICD-10-CM | POA: Diagnosis not present

## 2021-01-24 DIAGNOSIS — F17210 Nicotine dependence, cigarettes, uncomplicated: Secondary | ICD-10-CM | POA: Diagnosis present

## 2021-01-24 DIAGNOSIS — Z90722 Acquired absence of ovaries, bilateral: Secondary | ICD-10-CM | POA: Diagnosis not present

## 2021-01-24 DIAGNOSIS — K50012 Crohn's disease of small intestine with intestinal obstruction: Principal | ICD-10-CM | POA: Diagnosis present

## 2021-01-24 DIAGNOSIS — D72829 Elevated white blood cell count, unspecified: Secondary | ICD-10-CM

## 2021-01-24 DIAGNOSIS — R601 Generalized edema: Secondary | ICD-10-CM | POA: Diagnosis not present

## 2021-01-24 DIAGNOSIS — I341 Nonrheumatic mitral (valve) prolapse: Secondary | ICD-10-CM | POA: Diagnosis present

## 2021-01-24 DIAGNOSIS — Z79899 Other long term (current) drug therapy: Secondary | ICD-10-CM | POA: Diagnosis not present

## 2021-01-24 DIAGNOSIS — E162 Hypoglycemia, unspecified: Secondary | ICD-10-CM | POA: Diagnosis not present

## 2021-01-24 DIAGNOSIS — K56609 Unspecified intestinal obstruction, unspecified as to partial versus complete obstruction: Secondary | ICD-10-CM

## 2021-01-24 DIAGNOSIS — K449 Diaphragmatic hernia without obstruction or gangrene: Secondary | ICD-10-CM | POA: Diagnosis present

## 2021-01-24 DIAGNOSIS — F32A Depression, unspecified: Secondary | ICD-10-CM | POA: Diagnosis present

## 2021-01-24 LAB — URINALYSIS, ROUTINE W REFLEX MICROSCOPIC
Bacteria, UA: NONE SEEN
Bilirubin Urine: NEGATIVE
Glucose, UA: NEGATIVE mg/dL
Hgb urine dipstick: NEGATIVE
Ketones, ur: 20 mg/dL — AB
Leukocytes,Ua: NEGATIVE
Nitrite: NEGATIVE
Protein, ur: 100 mg/dL — AB
Specific Gravity, Urine: 1.026 (ref 1.005–1.030)
pH: 8 (ref 5.0–8.0)

## 2021-01-24 LAB — CBC WITH DIFFERENTIAL/PLATELET
Abs Immature Granulocytes: 0.06 10*3/uL (ref 0.00–0.07)
Basophils Absolute: 0 10*3/uL (ref 0.0–0.1)
Basophils Relative: 0 %
Eosinophils Absolute: 0 10*3/uL (ref 0.0–0.5)
Eosinophils Relative: 0 %
HCT: 37 % (ref 36.0–46.0)
Hemoglobin: 12.3 g/dL (ref 12.0–15.0)
Immature Granulocytes: 0 %
Lymphocytes Relative: 16 %
Lymphs Abs: 2.3 10*3/uL (ref 0.7–4.0)
MCH: 30.3 pg (ref 26.0–34.0)
MCHC: 33.2 g/dL (ref 30.0–36.0)
MCV: 91.1 fL (ref 80.0–100.0)
Monocytes Absolute: 0.9 10*3/uL (ref 0.1–1.0)
Monocytes Relative: 6 %
Neutro Abs: 11.8 10*3/uL — ABNORMAL HIGH (ref 1.7–7.7)
Neutrophils Relative %: 78 %
Platelets: 336 10*3/uL (ref 150–400)
RBC: 4.06 MIL/uL (ref 3.87–5.11)
RDW: 15.4 % (ref 11.5–15.5)
WBC: 15.1 10*3/uL — ABNORMAL HIGH (ref 4.0–10.5)
nRBC: 0 % (ref 0.0–0.2)

## 2021-01-24 LAB — COMPREHENSIVE METABOLIC PANEL
ALT: 26 U/L (ref 0–44)
AST: 27 U/L (ref 15–41)
Albumin: 2.9 g/dL — ABNORMAL LOW (ref 3.5–5.0)
Alkaline Phosphatase: 68 U/L (ref 38–126)
Anion gap: 10 (ref 5–15)
BUN: 19 mg/dL (ref 8–23)
CO2: 26 mmol/L (ref 22–32)
Calcium: 8 mg/dL — ABNORMAL LOW (ref 8.9–10.3)
Chloride: 106 mmol/L (ref 98–111)
Creatinine, Ser: 0.66 mg/dL (ref 0.44–1.00)
GFR, Estimated: >60 mL/min (ref >60)
Glucose, Bld: 81 mg/dL (ref 70–99)
Potassium: 3.1 mmol/L — ABNORMAL LOW (ref 3.5–5.1)
Sodium: 142 mmol/L (ref 135–145)
Total Bilirubin: 1.3 mg/dL — ABNORMAL HIGH (ref 0.3–1.2)
Total Protein: 6 g/dL — ABNORMAL LOW (ref 6.5–8.1)

## 2021-01-24 LAB — LIPASE, BLOOD: Lipase: 19 U/L (ref 11–51)

## 2021-01-24 LAB — MAGNESIUM: Magnesium: 1.9 mg/dL (ref 1.7–2.4)

## 2021-01-24 MED ORDER — SODIUM CHLORIDE 0.9 % IV SOLN: INTRAVENOUS | Status: DC

## 2021-01-24 MED ORDER — LACTATED RINGERS IV BOLUS
1000.0000 mL | Freq: Once | INTRAVENOUS | Status: AC
  Administered 2021-01-24: 1000 mL via INTRAVENOUS

## 2021-01-24 MED ORDER — POTASSIUM CHLORIDE 10 MEQ/100ML IV SOLN
10.0000 meq | Freq: Once | INTRAVENOUS | Status: DC
  Filled 2021-01-24: qty 100

## 2021-01-24 MED ORDER — PROCHLORPERAZINE EDISYLATE 10 MG/2ML IJ SOLN
5.0000 mg | INTRAMUSCULAR | Status: DC | PRN
  Administered 2021-01-29: 10 mg via INTRAVENOUS
  Filled 2021-01-24: qty 2

## 2021-01-24 MED ORDER — POTASSIUM CHLORIDE 10 MEQ/100ML IV SOLN: 10.0000 meq | INTRAVENOUS | Status: DC

## 2021-01-24 MED ORDER — METHOCARBAMOL 1000 MG/10ML IJ SOLN
1000.0000 mg | Freq: Four times a day (QID) | INTRAVENOUS | Status: DC | PRN
  Filled 2021-01-24: qty 10

## 2021-01-24 MED ORDER — METHYLPREDNISOLONE SODIUM SUCC 125 MG IJ SOLR
125.0000 mg | Freq: Once | INTRAMUSCULAR | Status: AC
  Administered 2021-01-24: 125 mg via INTRAVENOUS
  Filled 2021-01-24: qty 2

## 2021-01-24 MED ORDER — SODIUM CHLORIDE 0.9 % IV SOLN
8.0000 mg | Freq: Four times a day (QID) | INTRAVENOUS | Status: DC | PRN
  Filled 2021-01-24: qty 4

## 2021-01-24 MED ORDER — DIPHENHYDRAMINE HCL 50 MG/ML IJ SOLN
12.5000 mg | Freq: Four times a day (QID) | INTRAMUSCULAR | Status: DC | PRN
  Administered 2021-01-28: 12.5 mg via INTRAVENOUS
  Filled 2021-01-24: qty 1

## 2021-01-24 MED ORDER — MAGIC MOUTHWASH
15.0000 mL | Freq: Four times a day (QID) | ORAL | Status: DC | PRN
  Filled 2021-01-24: qty 15

## 2021-01-24 MED ORDER — METOPROLOL TARTRATE 5 MG/5ML IV SOLN: 5.0000 mg | Freq: Four times a day (QID) | INTRAVENOUS | Status: DC | PRN

## 2021-01-24 MED ORDER — SODIUM CHLORIDE 0.9 % IV BOLUS
1000.0000 mL | Freq: Once | INTRAVENOUS | Status: AC
  Administered 2021-01-24: 1000 mL via INTRAVENOUS

## 2021-01-24 MED ORDER — LACTATED RINGERS IV BOLUS: 1000.0000 mL | Freq: Three times a day (TID) | INTRAVENOUS | Status: DC | PRN

## 2021-01-24 MED ORDER — LIP MEDEX EX OINT
1.0000 "application " | TOPICAL_OINTMENT | Freq: Two times a day (BID) | CUTANEOUS | Status: DC
  Administered 2021-01-24 – 2021-01-30 (×11): 1 via TOPICAL
  Filled 2021-01-24 (×2): qty 7

## 2021-01-24 MED ORDER — ONDANSETRON HCL 4 MG/2ML IJ SOLN
4.0000 mg | Freq: Once | INTRAMUSCULAR | Status: AC
  Administered 2021-01-24: 4 mg via INTRAVENOUS
  Filled 2021-01-24: qty 2

## 2021-01-24 MED ORDER — DIATRIZOATE MEGLUMINE & SODIUM 66-10 % PO SOLN
90.0000 mL | Freq: Once | ORAL | Status: AC
  Administered 2021-01-24: 90 mL via NASOGASTRIC
  Filled 2021-01-24: qty 90

## 2021-01-24 MED ORDER — ONDANSETRON HCL 4 MG/2ML IJ SOLN
4.0000 mg | Freq: Four times a day (QID) | INTRAMUSCULAR | Status: DC | PRN
  Filled 2021-01-24: qty 2

## 2021-01-24 MED ORDER — BISACODYL 10 MG RE SUPP: 10.0000 mg | Freq: Two times a day (BID) | RECTAL | Status: DC | PRN

## 2021-01-24 MED ORDER — HYDROMORPHONE HCL 1 MG/ML IJ SOLN
0.5000 mg | INTRAMUSCULAR | Status: AC | PRN
  Administered 2021-01-24 – 2021-01-26 (×2): 0.5 mg via INTRAVENOUS
  Filled 2021-01-24: qty 1
  Filled 2021-01-24: qty 0.5

## 2021-01-24 NOTE — ED Notes (Signed)
Patient had to have gown and bed lines changed prior to going upstairs which cause a delay

## 2021-01-24 NOTE — ED Triage Notes (Signed)
Pt presents to the ED via EMS from home with abd pain, n/v/d. Pt has a hx of Crohn's and SBO. Per EMS, pt has surgical appt tomorrow. Per EMS, pt received 234mg Fentanyl, and 491mZofran, 500cc bolus NS en route.

## 2021-01-24 NOTE — Telephone Encounter (Signed)
Patient has been scheduled for [facility] consult for 01/25/21

## 2021-01-24 NOTE — ED Provider Notes (Signed)
[facility] DEPT Provider Note   CSN: [id number] Arrival date & time: 01/24/21  1625     History Chief Complaint  Patient presents with  . Abdominal Pain  . Nausea  . Diarrhea    Cynthia [name] is a 60 y.o. female.  HPI   Patient presents to the ED for evaluation of abdominal pain vomiting and diarrhea.  Patient has history of Crohn's disease.  She was recently admitted to the hospital on March 8 and discharged on March 13 for Crohn's ileitis with intestinal obstruction.  Patient states she had been doing well until yesterday.  After eating she started developing nausea vomiting as well as diarrhea.  She estimates vomiting maybe 20-25 times since yesterday.  She has had multiple episodes of diarrhea but not quite as frequent.  Patient is having waves of abdominal pain throughout her abdomen.  She now feels like she is getting more bloated and her abdomen is distended.  Patient is followed by [facility].  She was referred to a colorectal surgeon at [facility] due to insurance reasons.  She was scheduled to have her appointment tomorrow.  Past Medical History:  Diagnosis Date  . Anemia 2012  . Anxiety   . Bronchitis   . Crohn's ileitis ([place])   . Depression   . GERD (gastroesophageal reflux disease)   . Headache(784.0)    MIGRAINE  . Heart murmur   . Hiatal hernia   . Hypertension   . IBS (irritable bowel syndrome)   . Incontinence   . MVP (mitral valve prolapse)   . RLS (restless legs syndrome)   . Tubular adenoma of colon 08/2019    Patient Active Problem List   Diagnosis Date Noted  . 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 History of rectal polyp 2020 01/12/2021  . Anxiety associated with depression 01/12/2021  . Hypokalemia 12/04/2020  . Cough 11/27/2020  . Otalgia of both ears 11/27/2020  . Body aches 11/27/2020  . Fever 11/27/2020  . High risk medication use 11/27/2020  . Crohn's  disease of ileum with fistulas ([place]) 06/27/2020  . 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 Crohn's ileitis, with intestinal obstruction ([place]) 10/05/2019  . Colitis   . Rectal polyp   . 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  . RLS (restless legs syndrome) 09/25/2014  . Current smoker 09/19/2011  . Migraine headache 09/19/2011  . Essential hypertension 09/19/2011  . Obesity (BMI 30-39.9) 09/19/2011  . History of serrated colonic polyp 01/13/2011  . Gastroesophageal reflux disease 03/17/2008  . MVP (mitral valve prolapse) 03/17/2008  . HIATAL HERNIA 05/03/2002    Past Surgical History:  Procedure Laterality Date  . ABDOMINAL HYSTERECTOMY  06/17/2011   Procedure: HYSTERECTOMY ABDOMINAL;  Surgeon: Arloa Koh;  Location: [facility];  Service: Gynecology;  Laterality: N/A;  Converted to Abdominal Hysterectomy with lysis of adhesions   . ANTERIOR AND POSTERIOR REPAIR  02/25/2012   Procedure: ANTERIOR (CYSTOCELE) AND POSTERIOR REPAIR (RECTOCELE);  Surgeon: Daria Pastures, MD;  Location: [facility];  Service: Gynecology;  Laterality: N/A;  ANterior cystocele repair ONLY  . BIOPSY  08/29/2019   Procedure: BIOPSY;  Surgeon: Lavena Bullion, DO;  Location: [facility];  Service: Gastroenterology;;  . BLADDER SUSPENSION  02/25/2012   Procedure: TRANSVAGINAL TAPE (TVT) PROCEDURE;  Surgeon: Daria Pastures, MD;  Location: [facility];  Service: Gynecology;  Laterality: N/A;  .  COLONOSCOPY WITH PROPOFOL N/A 08/29/2019   Procedure: COLONOSCOPY WITH PROPOFOL;  Surgeon: Lavena Bullion, DO;  Location: [facility];  Service: Gastroenterology;  Laterality: N/A;  . CYSTOSCOPY  02/25/2012   Procedure: CYSTOSCOPY;  Surgeon: Daria Pastures, MD;  Location: [facility];  Service: Gynecology;  Laterality: N/A;  . KNEE ARTHROSCOPY  2010  . LAPAROSCOPIC ASSISTED VAGINAL HYSTERECTOMY  06/17/2011   Procedure: LAPAROSCOPIC ASSISTED  VAGINAL HYSTERECTOMY;  Surgeon: Arloa Koh;  Location: [facility];  Service: Gynecology;  Laterality: N/A;  Attempted   . POLYPECTOMY  08/29/2019   Procedure: POLYPECTOMY;  Surgeon: Lavena Bullion, DO;  Location: [facility];  Service: Gastroenterology;;  . SALPINGOOPHORECTOMY  06/17/2011   Procedure: SALPINGO OOPHERECTOMY;  Surgeon: Arloa Koh;  Location: [facility];  Service: Gynecology;  Laterality: Bilateral;  . TONSILLECTOMY       OB History   No obstetric history on file.     Family History  Problem Relation Age of Onset  . Diabetes Mother   . Dementia Mother   . Clotting disorder Father   . Macular degeneration Father   . Crohn's disease Sister   . Colon cancer Neg Hx   . Esophageal cancer Neg Hx   . Pancreatic cancer Neg Hx   . Stomach cancer Neg Hx   . Liver disease Neg Hx     Social History   Tobacco Use  . Smoking status: Current Every Day Smoker    Packs/day: 0.50    Types: Cigarettes  . Smokeless tobacco: Never Used  . Tobacco comment: none in 2 weeks as of 12/04/20  Vaping Use  . Vaping Use: Former  Substance Use Topics  . Alcohol use: Not Currently  . Drug use: Yes    Types: Cocaine    Comment: socially; d/c 3 weeks ago per patient 05/31/20    Home Medications Prior to Admission medications   Medication Sig Start Date End Date Taking? Authorizing Provider  diphenhydramine-acetaminophen (TYLENOL PM) 25-500 MG TABS tablet Take 2 tablets by mouth at bedtime as needed (sleep/pain).    [provider]  esomeprazole (NEXIUM) 40 MG capsule TAKE ONE (1) CAPSULE BY MOUTH 2 TIMES DAILY Patient taking differently: Take 40 mg by mouth 2 (two) times daily before a meal. 07/03/20   Antonieta Pert, MD  hyoscyamine (LEVSIN) 0.125 MG tablet Take 2 tablets (0.25 mg total) by mouth every 4 (four) hours as needed for cramping. 12/25/20   [name], MD  losartan-hydrochlorothiazide (HYZAAR) 100-12.5 MG tablet Take 1 tablet by mouth daily. 08/03/20   Denita Lung, MD  ondansetron (ZOFRAN-ODT) 4 MG disintegrating tablet Take 1 tablet (4 mg total) by mouth every 8 (eight) hours as needed for nausea or vomiting. 01/13/21   Rai, Ripudeep K, MD  phenylephrine (NEO-SYNEPHRINE) 1 % nasal spray Place 1 drop into both nostrils every 6 (six) hours as needed for congestion. 12/09/20   Cristal Deer, MD  polycarbophil (FIBERCON) 625 MG tablet Take 1 tablet (625 mg total) by mouth 2 (two) times daily. 01/13/21   Rai, Vernelle Emerald, MD  pramipexole (MIRAPEX) 1 MG tablet Take 1 tablet by mouth daily Patient taking differently: Take 1 mg by mouth daily. 03/26/20   Denita Lung, MD  predniSONE (DELTASONE) 10 MG tablet Please take prednisone 10 mg twice daily for 2 weeks. Decrease prednisone dose by 5 mg every 2 weeks 01/13/21   Rai, Vernelle Emerald, MD  venlafaxine XR (EFFEXOR-XR) 150 MG 24 hr capsule TAKE 2  CAPSULES BY MOUTH EVERY DAY Patient taking differently: Take 300 mg by mouth daily. 07/26/20   Denita Lung, MD    Allergies    Iodine  Review of Systems   Review of Systems  All other systems reviewed and are negative.   Physical Exam Updated Vital Signs BP (!) 121/91 (BP Location: Left Arm)   Pulse (!) 104   Temp 98.1 F (36.7 C) (Oral)   Resp 18   Ht 1.6 m (5' 3" )   Wt 60.9 kg   LMP 06/10/2011 (Exact Date)   SpO2 95%   BMI 23.78 kg/m   Physical Exam Vitals and nursing note reviewed.  Constitutional:      General: She is not in acute distress.    Appearance: She is well-developed.  HENT:     Head: Normocephalic and atraumatic.     Right Ear: External ear normal.     Left Ear: External ear normal.  Eyes:     General: No scleral icterus.       Right eye: No discharge.        Left eye: No discharge.     Conjunctiva/sclera: Conjunctivae normal.  Neck:     Trachea: No tracheal deviation.  Cardiovascular:     Rate and Rhythm: Normal rate and regular rhythm.  Pulmonary:     Effort: Pulmonary effort is normal. No respiratory distress.     Breath  sounds: Normal breath sounds. No stridor. No wheezing or rales.  Abdominal:     General: Abdomen is protuberant. Bowel sounds are normal. There is distension.     Palpations: Abdomen is soft.     Tenderness: There is generalized abdominal tenderness. There is no guarding or rebound.  Musculoskeletal:        General: No tenderness.     Cervical back: Neck supple.  Skin:    General: Skin is warm and dry.     Findings: No rash.  Neurological:     Mental Status: She is alert.     Cranial Nerves: No cranial nerve deficit (no facial droop, extraocular movements intact, no slurred speech).     Sensory: No sensory deficit.     Motor: No abnormal muscle tone or seizure activity.     Coordination: Coordination normal.     ED Results / Procedures / Treatments   Labs (all labs ordered are listed, but only abnormal results are displayed) Labs Reviewed  COMPREHENSIVE METABOLIC PANEL - Abnormal; Notable for the following components:      Result Value   Potassium 3.1 (*)    Calcium 8.0 (*)    Total Protein 6.0 (*)    Albumin 2.9 (*)    Total Bilirubin 1.3 (*)    All other components within normal limits  CBC WITH DIFFERENTIAL/PLATELET - Abnormal; Notable for the following components:   WBC 15.1 (*)    Neutro Abs 11.8 (*)    All other components within normal limits  URINALYSIS, ROUTINE W REFLEX MICROSCOPIC - Abnormal; Notable for the following components:   Ketones, ur 20 (*)    Protein, ur 100 (*)    All other components within normal limits  RESP PANEL BY RT-PCR (FLU A&B, COVID) ARPGX2  LIPASE, BLOOD  MAGNESIUM    EKG None  Radiology DG Abd Acute W/Chest  Result Date: 01/24/2021 CLINICAL DATA:  Abdominal pain, vomiting.  History of obstruction EXAM: DG ABDOMEN ACUTE WITH 1 VIEW CHEST COMPARISON:  01/10/2021 FINDINGS: Markedly dilated small bowel loops with air-fluid level  compatible with high-grade small bowel obstruction. Small bowel loops measure up to 8 cm in diameter. No free  air organomegaly. Lungs clear. Heart is normal size. No effusions. No acute bony abnormality. IMPRESSION: Markedly dilated small bowel loops with air-fluid levels compatible with high-grade small bowel obstruction. Electronically Signed   By: Rolm Baptise M.D.   On: 01/24/2021 17:17    Procedures Procedures   Medications Ordered in ED Medications  sodium chloride 0.9 % bolus 1,000 mL (1,000 mLs Intravenous New Bag/Given 01/24/21 1951)    And  0.9 %  sodium chloride infusion ( Intravenous New Bag/Given 01/24/21 1957)  HYDROmorphone (DILAUDID) injection 0.5 mg (0.5 mg Intravenous Given 01/24/21 1953)  diatrizoate meglumine-sodium (GASTROGRAFIN) 66-10 % solution 90 mL (has no administration in time range)  lactated ringers bolus 1,000 mL (has no administration in time range)  lactated ringers bolus 1,000 mL (has no administration in time range)  methocarbamol (ROBAXIN) 1,000 mg in dextrose 5 % 100 mL IVPB (has no administration in time range)  ondansetron (ZOFRAN) injection 4 mg (has no administration in time range)    Or  ondansetron (ZOFRAN) 8 mg in sodium chloride 0.9 % 50 mL IVPB (has no administration in time range)  prochlorperazine (COMPAZINE) injection 5-10 mg (has no administration in time range)  lip balm (CARMEX) ointment 1 application (has no administration in time range)  magic mouthwash (has no administration in time range)  bisacodyl (DULCOLAX) suppository 10 mg (has no administration in time range)  diphenhydrAMINE (BENADRYL) injection 12.5-25 mg (has no administration in time range)  metoprolol tartrate (LOPRESSOR) injection 5 mg (has no administration in time range)  potassium chloride 10 mEq in 100 mL IVPB (has no administration in time range)  ondansetron (ZOFRAN) injection 4 mg (4 mg Intravenous Given 01/24/21 1859)  methylPREDNISolone sodium succinate (SOLU-MEDROL) 125 mg/2 mL injection 125 mg (125 mg Intravenous Given 01/24/21 2001)    ED Course  I have reviewed the  triage vital signs and the nursing notes.  Pertinent labs & imaging results that were available during my care of the patient were reviewed by me and considered in my medical decision making (see chart for details).  Clinical Course as of 01/24/21 2111  Thu Jan 24, 2021  1731 Acute abdominal series is consistent with recurrent bowel obstruction [JK]  1733 Acute abdominal series suggests high-grade bowel obstruction.  NG tube ordered [JK]  1742 Discussed case with [facility].  Will consult on pt in the am [JK]  1804 Discussed with Dr Johney Maine.   No acute surgical interventions at this time.  Will follow up in am. [JK]    Clinical Course User Index [JK] Dorie Rank, MD   MDM Rules/Calculators/A&P                          Patient presented to ED for recurrent complications associated with her Crohn's disease.  Patient has had issues with recurrent bowel obstructions.  She is having similar episodes here today.  Abdominal x-rays do confirm a small bowel obstruction.  Laboratory test otherwise show hypokalemia.  NG tube has been ordered.  Potassium replacement has been ordered.  IV steroids have been ordered.  Final Clinical Impression(s) / ED Diagnoses Final diagnoses:  Crohn's disease with intestinal obstruction, unspecified gastrointestinal tract location ([place])  Hypokalemia      Dorie Rank, MD 01/24/21 2116

## 2021-01-24 NOTE — Consult Note (Addendum)
[place]  [birth year] [id number]  Patient Care Team: Denita Lung, MD as PCP - General (Family Medicine) [name], MD as Consulting Physician (Gastroenterology)  This patient is a 60 y.o.female that has struggled with abdominal pain and obstructive-like symptoms for over a decade.  Her sister was diagnosed with Crohn's disease in the early 2010s.  Then the patient was diagnosed with Crohn's disease.  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  But more lately she has had worsening pain, unintentional weight loss, and obstructive-like symptoms.  Followed by [facility] gastroenterology.  [facility], our group, has been occasionally involved.    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  Plan has been for immunosuppression to control more recent flares of obstructive-like symptoms.  Started on [employer].  Seem to be improving follow-up CAT scan but she came into the hospital with obstructive-like symptoms.  She felt worse and was treated with IV steroid bolus.  She opened up not needing surgery, and was discharged earlier this month tolerating soft foods.  Recommendation was made for colorectal surgery follow-up.  Patient told my partner, Dr. Brantley Stage, that she wanted to go to [facility].  Apparently since that discharge discussion made with [facility] gastroenterology and had to go to [place] for insurance purposes.  Unclear.  Apparently patient discussed at our complex gastroenterology / IBD monthly conference earlier this week.  I will reach out to my other colorectal partners to see what recommendations were made since I got got to the conference much later was not aware that she was added on.  Patient told Dr. Tomi Bamberger today that she was going to get colorectal surgery evaluation by [facility] for insurance  purposes.  Nonetheless I would hydrate, nasogastric tube decompression with small bowel obstruction protocol.  I do not think she is ever had any abdominal surgery and is very likely at the point with her recurrent readmissions she just needs to be resected with ostomy with TNA and regroup.  Consultation requested again with [facility] since she is choosing to being admitted in the [facility] health system, [facility]borne Oman has not been contacted.  Labs not back yet.  The patient is not in shock nor perforated, so I do not think she requires emergency surgery tonight.  While she is admitted at [facility], we can help follow.  [place] colorectal surgeons will need to discuss with [facility] gastroenterology and the patient about what colorectal follow-up is covered and desired.  Addendum: Covid positive - had episode of Crying and coughing & spit out NGT . Has many loose BM diarrhea episodes.  No more nausea or vomiting. Was due to see [facility] colorectal today.  No need for emergency surgery at this moment.  See if she can stabilize so that she can have close outpatient follow-up. If not, may need surgery this admission - most likely resection w ostomy..  Will reach out to her gastroenterologists to note the patient has been readmitted.   Patient Active Problem List   Diagnosis Date Noted  . Crohn's ileitis, with intestinal obstruction ([place]) 10/05/2019    Priority: High  . 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[place]) 06/27/2020    Priority: Medium  . 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  History of rectal polyp 2020 01/12/2021  . Anxiety associated with depression 01/12/2021  . Hypokalemia 12/04/2020  . Cough 11/27/2020  . Otalgia of both ears 11/27/2020  . Body aches 11/27/2020  . Fever 11/27/2020  . High risk medication use 11/27/2020  . 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 Colitis   . Rectal polyp   . 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  . RLS (restless legs syndrome) 09/25/2014  . Current smoker 09/19/2011  . Migraine headache 09/19/2011  . Essential hypertension 09/19/2011  . Obesity (BMI 30-39.9) 09/19/2011  . History of serrated colonic polyp 01/13/2011  . Gastroesophageal reflux disease 03/17/2008  . MVP (mitral valve prolapse) 03/17/2008  . HIATAL HERNIA 05/03/2002    Past Medical History:  Diagnosis Date  . Anemia 2012  . Anxiety   . Bronchitis   . Crohn's ileitis ([place])   . Depression   . GERD (gastroesophageal reflux disease)   . Headache(784.0)    MIGRAINE  . Heart murmur   . Hiatal hernia   . Hypertension   . IBS (irritable bowel syndrome)   . Incontinence   . MVP (mitral valve prolapse)   . RLS (restless legs syndrome)   . Tubular adenoma of colon 08/2019    Past Surgical History:  Procedure Laterality Date  . ABDOMINAL HYSTERECTOMY  06/17/2011   Procedure: HYSTERECTOMY ABDOMINAL;  Surgeon: Arloa Koh;  Location: [place] [facility];  Service: Gynecology;  Laterality: N/A;  Converted to Abdominal Hysterectomy with lysis of adhesions   . ANTERIOR AND POSTERIOR REPAIR  02/25/2012   Procedure: ANTERIOR (CYSTOCELE) AND POSTERIOR REPAIR (RECTOCELE);  Surgeon: Daria Pastures, MD;  Location: [place] [facility];  Service: Gynecology;  Laterality: N/A;  ANterior cystocele repair ONLY  . BIOPSY  08/29/2019   Procedure: BIOPSY;  Surgeon: Lavena Bullion, DO;  Location: [place];  Service: Gastroenterology;;  . BLADDER SUSPENSION  02/25/2012   Procedure: TRANSVAGINAL TAPE (TVT) PROCEDURE;  Surgeon: Daria Pastures, MD;  Location: [place] [facility];  Service: Gynecology;  Laterality: N/A;  . COLONOSCOPY WITH PROPOFOL N/A 08/29/2019   Procedure: COLONOSCOPY WITH PROPOFOL;  Surgeon: Lavena Bullion, DO;  Location: [place];  Service: Gastroenterology;  Laterality: N/A;  . CYSTOSCOPY  02/25/2012   Procedure: CYSTOSCOPY;  Surgeon: Daria Pastures, MD;   Location: [facility];  Service: Gynecology;  Laterality: N/A;  . KNEE ARTHROSCOPY  2010  . LAPAROSCOPIC ASSISTED VAGINAL HYSTERECTOMY  06/17/2011   Procedure: LAPAROSCOPIC ASSISTED VAGINAL HYSTERECTOMY;  Surgeon: Arloa Koh;  Location: [facility];  Service: Gynecology;  Laterality: N/A;  Attempted   . POLYPECTOMY  08/29/2019   Procedure: POLYPECTOMY;  Surgeon: Lavena Bullion, DO;  Location: [facility];  Service: Gastroenterology;;  . SALPINGOOPHORECTOMY  06/17/2011   Procedure: SALPINGO OOPHERECTOMY;  Surgeon: Arloa Koh;  Location: [place] [facility];  Service: Gynecology;  Laterality: Bilateral;  . TONSILLECTOMY      Social History   Socioeconomic History  . Marital status: Widowed    Spouse name: Not on file  . Number of children: 1  . Years of education: Not on file  . Highest education level: Not on file  Occupational History  . Occupation: Designer, television/film set: [employer]  Tobacco Use  . Smoking status: Current Every Day Smoker    Packs/day: 0.50    Types: Cigarettes  . Smokeless tobacco: Never Used  . Tobacco comment: none in 2 weeks as of 12/04/20  Vaping Use  . Vaping Use: Former  Substance  and Sexual Activity  . Alcohol use: Not Currently  . Drug use: Yes    Types: Cocaine    Comment: socially; d/c 3 weeks ago per patient 05/31/20  . Sexual activity: Not Currently  Other Topics Concern  . Not on file  Social History Narrative   Lives with boyfriend who she states is an alcoholic   Social Determinants of Radio broadcast assistant Strain: Not on file  Food Insecurity: Not on file  Transportation Needs: Not on file  Physical Activity: Not on file  Stress: Not on file  Social Connections: Not on file  Intimate Partner Violence: Not on file    Family History  Problem Relation Age of Onset  . Diabetes Mother   . Dementia Mother   . Clotting disorder Father   . Macular degeneration Father   . Crohn's disease Sister   . Colon cancer Neg Hx   . Esophageal cancer  Neg Hx   . Pancreatic cancer Neg Hx   . Stomach cancer Neg Hx   . Liver disease Neg Hx     Current Facility-Administered Medications  Medication Dose Route Frequency Provider Last Rate Last Admin  . sodium chloride 0.9 % bolus 1,000 mL  1,000 mL Intravenous Once Dorie Rank, MD       And  . 0.9 %  sodium chloride infusion   Intravenous Continuous Dorie Rank, MD      . bisacodyl (DULCOLAX) suppository 10 mg  10 mg Rectal Q12H PRN Michael Boston, MD      . diatrizoate meglumine-sodium (GASTROGRAFIN) 66-10 % solution 90 mL  90 mL Per NG tube Once Michael Boston, MD      . diphenhydrAMINE (BENADRYL) injection 12.5-25 mg  12.5-25 mg Intravenous Q6H PRN Michael Boston, MD      . HYDROmorphone (DILAUDID) injection 0.5 mg  0.5 mg Intravenous Q30 min PRN Dorie Rank, MD      . lactated ringers bolus 1,000 mL  1,000 mL Intravenous Once Michael Boston, MD      . lactated ringers bolus 1,000 mL  1,000 mL Intravenous Q8H PRN Michael Boston, MD      . lip balm (CARMEX) ointment 1 application  1 application Topical BID Michael Boston, MD      . magic mouthwash  15 mL Oral QID PRN Michael Boston, MD      . methocarbamol (ROBAXIN) 1,000 mg in dextrose 5 % 100 mL IVPB  1,000 mg Intravenous Q6H PRN Michael Boston, MD      . methylPREDNISolone sodium succinate (SOLU-MEDROL) 125 mg/2 mL injection 125 mg  125 mg Intravenous Once Dorie Rank, MD      . metoprolol tartrate (LOPRESSOR) injection 5 mg  5 mg Intravenous Q6H PRN Michael Boston, MD      . ondansetron (ZOFRAN) injection 4 mg  4 mg Intravenous Q6H PRN Michael Boston, MD       Or  . ondansetron (ZOFRAN) 8 mg in sodium chloride 0.9 % 50 mL IVPB  8 mg Intravenous Q6H PRN Michael Boston, MD      . prochlorperazine (COMPAZINE) injection 5-10 mg  5-10 mg Intravenous Q4H PRN Michael Boston, MD       Current Outpatient Medications  Medication Sig Dispense Refill  . diphenhydramine-acetaminophen (TYLENOL PM) 25-500 MG TABS tablet Take 2 tablets by mouth at bedtime as needed  (sleep/pain).    Marland Kitchen esomeprazole (NEXIUM) 40 MG capsule TAKE ONE (1) CAPSULE BY MOUTH 2 TIMES DAILY (Patient taking differently: Take 40  mg by mouth 2 (two) times daily before a meal.) 180 capsule 3  . hyoscyamine (LEVSIN) 0.125 MG tablet Take 2 tablets (0.25 mg total) by mouth every 4 (four) hours as needed for cramping. 360 tablet 11  . losartan-hydrochlorothiazide (HYZAAR) 100-12.5 MG tablet Take 1 tablet by mouth daily. 90 tablet 3  . ondansetron (ZOFRAN-ODT) 4 MG disintegrating tablet Take 1 tablet (4 mg total) by mouth every 8 (eight) hours as needed for nausea or vomiting. 60 tablet 5  . phenylephrine (NEO-SYNEPHRINE) 1 % nasal spray Place 1 drop into both nostrils every 6 (six) hours as needed for congestion. 30 mL 0  . polycarbophil (FIBERCON) 625 MG tablet Take 1 tablet (625 mg total) by mouth 2 (two) times daily. 120 tablet 2  . pramipexole (MIRAPEX) 1 MG tablet Take 1 tablet by mouth daily (Patient taking differently: Take 1 mg by mouth daily.) 90 tablet 3  . predniSONE (DELTASONE) 10 MG tablet Please take prednisone 10 mg twice daily for 2 weeks. Decrease prednisone dose by 5 mg every 2 weeks 60 tablet 1  . venlafaxine XR (EFFEXOR-XR) 150 MG 24 hr capsule TAKE 2 CAPSULES BY MOUTH EVERY DAY (Patient taking differently: Take 300 mg by mouth daily.) 180 capsule 0     Allergies  Allergen Reactions  . Iodine     REACTION: in eye gtts Iodine eye drops; pt has no reactions to CT contrast    BP 125/83   Pulse (!) 106   Temp 98.1 F (36.7 C) (Oral)   Resp 17   Ht 5' 3"  (1.6 m)   Wt 60.9 kg   LMP 06/10/2011 (Exact Date)   SpO2 99%   BMI 23.78 kg/m   CT Abdomen Pelvis W Contrast  Result Date: 01/08/2021 CLINICAL DATA:  Periumbilical abdominal pain. History of Crohn's disease. EXAM: CT ABDOMEN AND PELVIS WITH CONTRAST TECHNIQUE: Multidetector CT imaging of the abdomen and pelvis was performed using the standard protocol following bolus administration of intravenous contrast. CONTRAST:   142m OMNIPAQUE IOHEXOL 300 MG/ML  SOLN COMPARISON:  12/04/2020 FINDINGS: Lower chest: The lung bases are clear of acute process. No pleural effusion or pulmonary lesions. The heart is normal in size. No pericardial effusion. The distal esophagus and aorta are unremarkable. Hepatobiliary: No hepatic lesions or intrahepatic biliary dilatation. No common bile duct dilatation. Pancreas: Moderate pancreatic atrophy but no mass or inflammation. Spleen: Normal size.  No focal lesions. Adrenals/Urinary Tract: The adrenal glands and kidneys are unremarkable. The bladder is unremarkable. Stomach/Bowel: The stomach is unremarkable. The duodenum is unremarkable. High-grade small-bowel obstruction with markedly dilated fluid-filled loops of small bowel with air-fluid levels extending down into the pelvis. Tight stricture or adhesive process in the mid to lower central pelvis as demonstrated on the prior CT scan likely related to the patient's Crohn's disease. 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 Some associated mucosal enhancement could be local inflammatory process or possible active Crohn's disease. No definite abscess. Second area of probable adhesive change the mid small bowel with definite caliber change centrally at the level of the iliac crest. The colon is relatively decompressed. Vascular/Lymphatic: The aorta is normal in caliber. No dissection. The branch vessels are patent. The major venous structures are patent. No mesenteric or retroperitoneal mass or adenopathy. Small scattered lymph nodes are noted. Reproductive: Surgically absent. Other: No free air or free fluid collections to suggest perforation. Musculoskeletal: No significant  bony findings. IMPRESSION: 1. High-grade distal small-bowel obstruction likely due to stricture or adhesive process in the mid to lower central pelvis as demonstrated on the prior CT scan. Findings  suspicious for a small bowel to small bowel fistula in this area also. Second area of probable adhesive change in the mid small bowel with definite caliber change centrally at the level of the iliac crest. 2. No free air or free fluid collections to suggest perforation. Electronically Signed   By: Marijo Sanes M.D.   On: 01/08/2021 09:15   DG Abd Acute W/Chest  Result Date: 01/24/2021 CLINICAL DATA:  Abdominal pain, vomiting.  History of obstruction EXAM: DG ABDOMEN ACUTE WITH 1 VIEW CHEST COMPARISON:  01/10/2021 FINDINGS: Markedly dilated small bowel loops with air-fluid level compatible with high-grade small bowel obstruction. Small bowel loops measure up to 8 cm in diameter. No free air organomegaly. Lungs clear. Heart is normal size. No effusions. No acute bony abnormality. IMPRESSION: Markedly dilated small bowel loops with air-fluid levels compatible with high-grade small bowel obstruction. Electronically Signed   By: Rolm Baptise M.D.   On: 01/24/2021 17:17   DG Abd Portable 1V  Result Date: 01/10/2021 CLINICAL DATA:  Recurrent small-bowel obstruction with nausea and vomiting EXAM: PORTABLE ABDOMEN - 1 VIEW COMPARISON:  01/08/2021 FINDINGS: NG tube in the body of the stomach unchanged. Multiple dilated small bowel loops unchanged. Colon decompressed. Gas in the rectum. IMPRESSION: Dilated small bowel loops compatible with small bowel obstruction. NG tube in the stomach. No change from 2 days prior Electronically Signed   By: Franchot Gallo M.D.   On: 01/10/2021 11:12   DG Abd Portable 1V-Small Bowel Obstruction Protocol-initial, 8 hr delay  Result Date: 01/08/2021 CLINICAL DATA:  Small-bowel obstruction EXAM: PORTABLE ABDOMEN - 1 VIEW COMPARISON:  Plain film from earlier in the same day. FINDINGS: Scattered large and small bowel gas is noted. Persistent small bowel obstructive changes are seen. Contrast material is been administered and passed into the proximal small bowel. Some contrast is noted  within the more distal small bowel as well although no definitive colonic contrast is noted at this time. Continued follow-up is recommended. Contrast from prior CT is noted. IMPRESSION: Persistent small-bowel obstructive change. Contrast is noted within the small bowel although no definitive colonic contrast is seen at this time. Electronically Signed   By: Inez Catalina M.D.   On: 01/08/2021 21:22   DG Abd Portable 1V-Small Bowel Protocol-Position Verification  Result Date: 01/08/2021 CLINICAL DATA:  Nasogastric tube placement. Crohn's disease. Abdominal pain and nausea EXAM: PORTABLE ABDOMEN - 1 VIEW COMPARISON:  CT abdomen and pelvis January 08, 2021; abdominal radiograph December 04, 2020 FINDINGS: Nasogastric tube tip is at the level of the proximal gastric cardia. Side port is above the gastroesophageal junction. Multiple loops of dilated bowel again noted. No free air evident on supine examination. Lung bases clear. IMPRESSION: Nasogastric tube tip in proximal gastric cardia. Side port above the gastroesophageal junction. Advise advancing nasogastric tube approximately 8-10 cm. Dilated bowel remains. No free air evident on supine examination. Electronically Signed   By: Lowella Grip III M.D.   On: 01/08/2021 11:18    Note: This dictation was prepared with Dragon/digital dictation along with [employer]. Any transcriptional errors that result from this process are unintentional.   .Adin Hector, M.D., F.A.C.S. Gastrointestinal and Minimally Invasive Surgery [facility] [address], [place], [place] [address] [phone] Main / Paging  01/24/2021 7:22 PM

## 2021-01-24 NOTE — ED Notes (Signed)
NGT clamped at 2325. Needs to be clamped for 1 hour. Please let x-ray know in 8 hrs

## 2021-01-24 NOTE — ED Notes (Signed)
ED TO INPATIENT HANDOFF REPORT  Name/Age/Gender Cynthia [name] 60 y.o. female  Code Status Code Status History    Date Active Date Inactive Code Status Order ID Comments User Context   01/08/2021 1300 01/13/2021 1638 Full Code [id number]  Jonnie Finner, DO Inpatient   12/04/2020 2304 12/09/2020 1609 Full Code [id number]  Lenore Cordia, MD ED   06/27/2020 1218 07/03/2020 1848 Full Code [id number]  Donne Hazel, MD ED   08/26/2019 0410 08/29/2019 2106 Full Code [id number]  Rise Patience, MD ED   Advance Care Planning Activity    Questions for Most Recent Historical Code Status (Order [id number])       Home/SNF/Other Home  Chief Complaint Crohn's colitis, with intestinal obstruction ([place]) [K50.112]  Level of Care/Admitting Diagnosis ED Disposition    ED Disposition Condition [place]: [facility] [[id number]]  Level of Care: Med-Surg [16]  May admit patient to [facility] or [facility] if equivalent level of care is available:: Yes  Covid Evaluation: Asymptomatic Screening Protocol (No Symptoms)  Diagnosis: Crohn's colitis, with intestinal obstruction [facility]) [[id number]]  Admitting Physician: Eben Burow [[id number]]  Attending Physician: Eben Burow [[id number]]  Estimated length of stay: past midnight tomorrow  Certification:: I certify this patient will need inpatient services for at least 2 midnights       Medical History Past Medical History:  Diagnosis Date  . Anemia 2012  . Anxiety   . Bronchitis   . Crohn's ileitis ([place])   . Depression   . GERD (gastroesophageal reflux disease)   . Headache(784.0)    MIGRAINE  . Heart murmur   . Hiatal hernia   . Hypertension   . IBS (irritable bowel syndrome)   . Incontinence   . MVP (mitral valve prolapse)   . RLS (restless legs syndrome)   . Tubular adenoma of colon 08/2019    Allergies Allergies  Allergen Reactions  . Iodine     REACTION: in eye gtts Iodine eye  drops; pt has no reactions to CT contrast    IV Location/Drains/Wounds Patient Lines/Drains/Airways Status    Active Line/Drains/Airways    Name Placement date Placement time Site Days   Peripheral IV 01/24/21 Right Antecubital 01/24/21  1638  Antecubital  less than 1   Peripheral IV 01/24/21 Right Wrist 01/24/21  1930  Wrist  less than 1   NG/OG Tube Nasogastric 14 Fr. Right nare Aucultation;Xray Documented cm marking at nare/ corner of mouth 50 cm 01/24/21  2011  Right nare  less than 1          Labs/Imaging Results for orders placed or performed during the hospital encounter of 01/24/21 (from the past 48 hour(s))  Magnesium     Status: None   Collection Time: 01/24/21  4:41 PM  Result Value Ref Range   Magnesium 1.9 1.7 - 2.4 mg/dL    Comment: Performed at [facility], [place] [address] [place], [place] [address]  Urinalysis, Routine w reflex microscopic Urine, Clean Catch     Status: Abnormal   Collection Time: 01/24/21  4:51 PM  Result Value Ref Range   Color, Urine YELLOW YELLOW   APPearance CLEAR CLEAR   Specific Gravity, Urine 1.026 1.005 - 1.030   pH 8.0 5.0 - 8.0   Glucose, UA NEGATIVE NEGATIVE mg/dL   Hgb urine dipstick NEGATIVE NEGATIVE   Bilirubin Urine NEGATIVE NEGATIVE   Ketones, ur 20 (A) NEGATIVE mg/dL  Protein, ur 100 (A) NEGATIVE mg/dL   Nitrite NEGATIVE NEGATIVE   Leukocytes,Ua NEGATIVE NEGATIVE   RBC / HPF 0-5 0 - 5 RBC/hpf   WBC, UA 0-5 0 - 5 WBC/hpf   Bacteria, UA NONE SEEN NONE SEEN   Squamous Epithelial / LPF 6-10 0 - 5   Mucus PRESENT     Comment: Performed at [facility], [place] [address] [place], [place] [address]  Comprehensive metabolic panel     Status: Abnormal   Collection Time: 01/24/21  7:35 PM  Result Value Ref Range   Sodium 142 135 - 145 mmol/L   Potassium 3.1 (L) 3.5 - 5.1 mmol/L   Chloride 106 98 - 111 mmol/L   CO2 26 22 - 32 mmol/L   Glucose, Bld 81 70 - 99 mg/dL    Comment: Glucose  reference range applies only to samples taken after fasting for at least 8 hours.   BUN 19 8 - 23 mg/dL   Creatinine, Ser 0.66 0.44 - 1.00 mg/dL   Calcium 8.0 (L) 8.9 - 10.3 mg/dL   Total Protein 6.0 (L) 6.5 - 8.1 g/dL   Albumin 2.9 (L) 3.5 - 5.0 g/dL   AST 27 15 - 41 U/L   ALT 26 0 - 44 U/L   Alkaline Phosphatase 68 38 - 126 U/L   Total Bilirubin 1.3 (H) 0.3 - 1.2 mg/dL   GFR, Estimated >60 >60 mL/min    Comment: (NOTE) Calculated using the CKD-EPI Creatinine Equation (2021)    Anion gap 10 5 - 15    Comment: Performed at [facility], [place] [address], [place], [place] [address]  Lipase, blood     Status: None   Collection Time: 01/24/21  7:35 PM  Result Value Ref Range   Lipase 19 11 - 51 U/L    Comment: Performed at [facility], [place] [address] [place], [place] [address]  CBC with Diff     Status: Abnormal   Collection Time: 01/24/21  7:35 PM  Result Value Ref Range   WBC 15.1 (H) 4.0 - 10.5 K/uL   RBC 4.06 3.87 - 5.11 MIL/uL   Hemoglobin 12.3 12.0 - 15.0 g/dL   HCT 37.0 36.0 - 46.0 %   MCV 91.1 80.0 - 100.0 fL   MCH 30.3 26.0 - 34.0 pg   MCHC 33.2 30.0 - 36.0 g/dL   RDW 15.4 11.5 - 15.5 %   Platelets 336 150 - 400 K/uL   nRBC 0.0 0.0 - 0.2 %   Neutrophils Relative % 78 %   Neutro Abs 11.8 (H) 1.7 - 7.7 K/uL   Lymphocytes Relative 16 %   Lymphs Abs 2.3 0.7 - 4.0 K/uL   Monocytes Relative 6 %   Monocytes Absolute 0.9 0.1 - 1.0 K/uL   Eosinophils Relative 0 %   Eosinophils Absolute 0.0 0.0 - 0.5 K/uL   Basophils Relative 0 %   Basophils Absolute 0.0 0.0 - 0.1 K/uL   Immature Granulocytes 0 %   Abs Immature Granulocytes 0.06 0.00 - 0.07 K/uL    Comment: Performed at [facility], [place] [address] [place], [place] [address]   DG Abd Acute W/Chest  Result Date: 01/24/2021 CLINICAL DATA:  Abdominal pain, vomiting.  History of obstruction EXAM: DG ABDOMEN ACUTE WITH 1 VIEW CHEST COMPARISON:  01/10/2021 FINDINGS:  Markedly dilated small bowel loops with air-fluid level compatible with high-grade small bowel obstruction. Small bowel loops measure up to 8  cm in diameter. No free air organomegaly. Lungs clear. Heart is normal size. No effusions. No acute bony abnormality. IMPRESSION: Markedly dilated small bowel loops with air-fluid levels compatible with high-grade small bowel obstruction. Electronically Signed   By: Rolm Baptise M.D.   On: 01/24/2021 17:17   DG Abd Portable 1V-Small Bowel Protocol-Position Verification  Result Date: 01/24/2021 CLINICAL DATA:  Enteric catheter placement EXAM: PORTABLE ABDOMEN - 1 VIEW COMPARISON:  01/24/2021 FINDINGS: Single frontal view of the lower chest and upper abdomen demonstrates enteric catheter passing below diaphragm tip and side port projecting over the gastric antrum. Marked gaseous distention of the small bowel with multiple gas fluid levels again noted unchanged. The lung bases are clear. IMPRESSION: 1. Enteric catheter tip and side port projecting over the gastric antrum. 2. Persistent small bowel obstruction. Electronically Signed   By: Randa Ngo M.D.   On: 01/24/2021 21:18    Pending Labs Unresulted Labs (From admission, onward)          Start     Ordered   01/24/21 1734  Resp Panel by RT-PCR (Flu A&B, Covid) Nasopharyngeal Swab  (Tier 2 - Symptomatic/asymptomatic with Precautions )  Once,   STAT       Question Answer Comment  Is this test for diagnosis or screening Screening   Symptomatic for COVID-19 as defined by CDC No   Hospitalized for COVID-19 No   Admitted to ICU for COVID-19 No   Previously tested for COVID-19 Yes   Resident in a congregate (group) care setting No   Employed in healthcare setting No   Pregnant No   Has patient completed COVID vaccination(s) (2 doses of Pfizer/Moderna 1 dose of [employer]) Unknown      01/24/21 1733   Signed and Held  CBC  (enoxaparin (LOVENOX)    CrCl >/= 30 ml/min)  Once,   R       Comments:  Baseline for enoxaparin therapy IF NOT ALREADY DRAWN.  Notify MD if PLT < 100 K.    Signed and Held   Signed and Held  Creatinine, serum  (enoxaparin (LOVENOX)    CrCl >/= 30 ml/min)  Once,   R       Comments: Baseline for enoxaparin therapy IF NOT ALREADY DRAWN.    Signed and Held   Signed and Held  Creatinine, serum  (enoxaparin (LOVENOX)    CrCl >/= 30 ml/min)  Weekly,   R     Comments: while on enoxaparin therapy    Signed and Held   Signed and Held  Basic metabolic panel  Tomorrow morning,   R        Signed and Held   Signed and Held  CBC  Tomorrow morning,   R        Signed and Held          Vitals/Pain Today's Vitals   01/24/21 2015 01/24/21 2130 01/24/21 2136 01/24/21 2215  BP: (!) 121/91 128/89  (!) 127/93  Pulse: (!) 104 94  95  Resp: 18 12  16   Temp:    98.1 F (36.7 C)  TempSrc:    Oral  SpO2: 95% 100%  97%  Weight:      Height:      PainSc: 6   0-No pain 0-No pain    Isolation Precautions No active isolations  Medications Medications  sodium chloride 0.9 % bolus 1,000 mL (0 mLs Intravenous Stopped 01/24/21 2136)    And  0.9 %  sodium chloride infusion ( Intravenous New Bag/Given 01/24/21 1957)  HYDROmorphone (DILAUDID) injection 0.5 mg (0.5 mg Intravenous Given 01/24/21 1953)  diatrizoate meglumine-sodium (GASTROGRAFIN) 66-10 % solution 90 mL (has no administration in time range)  lactated ringers bolus 1,000 mL (has no administration in time range)  methocarbamol (ROBAXIN) 1,000 mg in dextrose 5 % 100 mL IVPB (has no administration in time range)  ondansetron (ZOFRAN) injection 4 mg (has no administration in time range)    Or  ondansetron (ZOFRAN) 8 mg in sodium chloride 0.9 % 50 mL IVPB (has no administration in time range)  prochlorperazine (COMPAZINE) injection 5-10 mg (has no administration in time range)  lip balm (CARMEX) ointment 1 application (1 application Topical Given 01/24/21 2138)  magic mouthwash (has no administration in time range)   bisacodyl (DULCOLAX) suppository 10 mg (has no administration in time range)  diphenhydrAMINE (BENADRYL) injection 12.5-25 mg (has no administration in time range)  metoprolol tartrate (LOPRESSOR) injection 5 mg (has no administration in time range)  potassium chloride 10 mEq in 100 mL IVPB (has no administration in time range)  ondansetron (ZOFRAN) injection 4 mg (4 mg Intravenous Given 01/24/21 1859)  methylPREDNISolone sodium succinate (SOLU-MEDROL) 125 mg/2 mL injection 125 mg (125 mg Intravenous Given 01/24/21 2001)  lactated ringers bolus 1,000 mL (1,000 mLs Intravenous New Bag/Given 01/24/21 2138)    Mobility walks

## 2021-01-24 NOTE — H&P (Signed)
History and Physical    Cynthia [name] ZOX:[id number] DOB: 1960/11/19 DOA: 01/24/2021  PCP: Denita Lung, MD   Patient coming from: Home  Chief Complaint: Abdominal pain with nausea and vomiting.  HPI: Cynthia [name] is a 60 y.o. female with medical history significant for HTN, Crohn's disease.  She was recently admitted to the hospital on March 8 and discharged on March 13 for Crohn's ileitis with intestinal obstruction. Patient states she had been doing well until yesterday.  After eating she started developing nausea vomiting. Continued to have nausea and vomiting all day today and not able to tolerate anything to eat or drink. She reports having chronic diarrhea which is unchanged for her.  She estimates vomiting maybe 20-25 times since yesterday.  Patient is having waves of abdominal pain throughout her abdomen. Abdominal pain is severe and rated as 10/10 at worst. She does not know of aggravating factors. She now feels like she is getting more bloated and her abdomen is distended.  Patient is followed by [facility] gastroenterology.  She was referred to a colorectal surgeon at [facility] due to insurance reasons.  She was scheduled to have her appointment tomorrow.  ED Course:  Found to have obstruction on abdominal series. Given IVF bolus, pain meds and antiemetics in ER. Pt is hemodynamically stable. Hospitalist service asked to admit for further management.  Review of Systems:  General: Denies fever, chills, weight loss, night sweats.  Denies dizziness.   HENT: Denies head trauma, headache, denies change in hearing, tinnitus.  Denies nasal bleeding.  Denies sore throat, sores in mouth.  Denies difficulty swallowing Eyes: Denies blurry vision, pain in eye, drainage.  Denies discoloration of eyes. Neck: Denies pain.  Denies swelling.  Denies pain with movement. Cardiovascular: Denies chest pain, palpitations.  Denies edema.  Denies orthopnea Respiratory: Denies shortness of breath,  cough.  Denies wheezing.  Denies sputum production Gastrointestinal: Reports abdominal pain and distension with  nausea, vomiting. Has chronic diarrhea.  Denies melena.  Denies hematemesis. Musculoskeletal: Denies limitation of movement.  Denies deformity or swelling.  Denies pain.  Denies arthralgias or myalgias. Genitourinary: Denies pelvic pain.  Denies urinary frequency or hesitancy.  Denies dysuria.  Skin: Denies rash.  Denies petechiae, purpura, ecchymosis. Neurological: Denies headache.  Denies syncope.  Denies seizure activity. Denies paresthesia.  Denies slurred speech, drooping face.  Denies visual change. Psychiatric: Denies depression, anxiety.  Denies hallucinations.  Past Medical History:  Diagnosis Date  . Anemia 2012  . Anxiety   . Bronchitis   . Crohn's ileitis ([place])   . Depression   . GERD (gastroesophageal reflux disease)   . Headache(784.0)    MIGRAINE  . Heart murmur   . Hiatal hernia   . Hypertension   . IBS (irritable bowel syndrome)   . Incontinence   . MVP (mitral valve prolapse)   . RLS (restless legs syndrome)   . Tubular adenoma of colon 08/2019    Past Surgical History:  Procedure Laterality Date  . ABDOMINAL HYSTERECTOMY  06/17/2011   Procedure: HYSTERECTOMY ABDOMINAL;  Surgeon: Arloa Koh;  Location: [facility];  Service: Gynecology;  Laterality: N/A;  Converted to Abdominal Hysterectomy with lysis of adhesions   . ANTERIOR AND POSTERIOR REPAIR  02/25/2012   Procedure: ANTERIOR (CYSTOCELE) AND POSTERIOR REPAIR (RECTOCELE);  Surgeon: Daria Pastures, MD;  Location: [facility];  Service: Gynecology;  Laterality: N/A;  ANterior cystocele repair ONLY  . BIOPSY  08/29/2019   Procedure: BIOPSY;  Surgeon: Gerrit Heck  V, DO;  Location: [facility];  Service: Gastroenterology;;  . BLADDER SUSPENSION  02/25/2012   Procedure: TRANSVAGINAL TAPE (TVT) PROCEDURE;  Surgeon: Daria Pastures, MD;  Location: [facility];  Service: Gynecology;  Laterality: N/A;  .  COLONOSCOPY WITH PROPOFOL N/A 08/29/2019   Procedure: COLONOSCOPY WITH PROPOFOL;  Surgeon: Lavena Bullion, DO;  Location: [place] [facility];  Service: Gastroenterology;  Laterality: N/A;  . CYSTOSCOPY  02/25/2012   Procedure: CYSTOSCOPY;  Surgeon: Daria Pastures, MD;  Location: [facility];  Service: Gynecology;  Laterality: N/A;  . KNEE ARTHROSCOPY  2010  . LAPAROSCOPIC ASSISTED VAGINAL HYSTERECTOMY  06/17/2011   Procedure: LAPAROSCOPIC ASSISTED VAGINAL HYSTERECTOMY;  Surgeon: Arloa Koh;  Location: [facility];  Service: Gynecology;  Laterality: N/A;  Attempted   . POLYPECTOMY  08/29/2019   Procedure: POLYPECTOMY;  Surgeon: Lavena Bullion, DO;  Location: [facility];  Service: Gastroenterology;;  . SALPINGOOPHORECTOMY  06/17/2011   Procedure: SALPINGO OOPHERECTOMY;  Surgeon: Arloa Koh;  Location: [facility];  Service: Gynecology;  Laterality: Bilateral;  . TONSILLECTOMY      Social History  reports that she has been smoking cigarettes. She has been smoking about 0.50 packs per day. She has never used smokeless tobacco. She reports previous alcohol use. She reports current drug use. Drug: Cocaine.  Allergies  Allergen Reactions  . Iodine     REACTION: in eye gtts Iodine eye drops; pt has no reactions to CT contrast    Family History  Problem Relation Age of Onset  . Diabetes Mother   . Dementia Mother   . Clotting disorder Father   . Macular degeneration Father   . Crohn's disease Sister   . Colon cancer Neg Hx   . Esophageal cancer Neg Hx   . Pancreatic cancer Neg Hx   . Stomach cancer Neg Hx   . Liver disease Neg Hx      Prior to Admission medications   Medication Sig Start Date End Date Taking? Authorizing Provider  diphenhydramine-acetaminophen (TYLENOL PM) 25-500 MG TABS tablet Take 2 tablets by mouth at bedtime as needed (sleep/pain).    [provider]  esomeprazole (NEXIUM) 40 MG capsule TAKE ONE (1) CAPSULE BY MOUTH 2 TIMES DAILY Patient taking differently:  Take 40 mg by mouth 2 (two) times daily before a meal. 07/03/20   Antonieta Pert, MD  hyoscyamine (LEVSIN) 0.125 MG tablet Take 2 tablets (0.25 mg total) by mouth every 4 (four) hours as needed for cramping. 12/25/20   [name], MD  losartan-hydrochlorothiazide (HYZAAR) 100-12.5 MG tablet Take 1 tablet by mouth daily. 08/03/20   Denita Lung, MD  ondansetron (ZOFRAN-ODT) 4 MG disintegrating tablet Take 1 tablet (4 mg total) by mouth every 8 (eight) hours as needed for nausea or vomiting. 01/13/21   Rai, Ripudeep K, MD  phenylephrine (NEO-SYNEPHRINE) 1 % nasal spray Place 1 drop into both nostrils every 6 (six) hours as needed for congestion. 12/09/20   Cristal Deer, MD  polycarbophil (FIBERCON) 625 MG tablet Take 1 tablet (625 mg total) by mouth 2 (two) times daily. 01/13/21   Rai, Vernelle Emerald, MD  pramipexole (MIRAPEX) 1 MG tablet Take 1 tablet by mouth daily Patient taking differently: Take 1 mg by mouth daily. 03/26/20   Denita Lung, MD  predniSONE (DELTASONE) 10 MG tablet Please take prednisone 10 mg twice daily for 2 weeks. Decrease prednisone dose by 5 mg every 2 weeks 01/13/21   Rai, Vernelle Emerald, MD  venlafaxine XR [facility])  150 MG 24 hr capsule TAKE 2 CAPSULES BY MOUTH EVERY DAY Patient taking differently: Take 300 mg by mouth daily. 07/26/20   Denita Lung, MD    Physical Exam: Vitals:   01/24/21 1805 01/24/21 1930 01/24/21 2015 01/24/21 2130  BP: 125/83 124/87 (!) 121/91 128/89  Pulse: (!) 106 95 (!) 104 94  Resp: 17 20 18 12   Temp: 98.1 F (36.7 C)     TempSrc: Oral     SpO2: 99% 99% 95% 100%  Weight:      Height:        Constitutional: NAD, calm, comfortable Vitals:   01/24/21 1805 01/24/21 1930 01/24/21 2015 01/24/21 2130  BP: 125/83 124/87 (!) 121/91 128/89  Pulse: (!) 106 95 (!) 104 94  Resp: 17 20 18 12   Temp: 98.1 F (36.7 C)     TempSrc: Oral     SpO2: 99% 99% 95% 100%  Weight:      Height:       General: WDWN, Alert and oriented x3.  Eyes: EOMI,  PERRL, conjunctivae normal.  Sclera nonicteric HENT:  [place]/AT, external ears normal.  Nares with NGT in left nares. No epistasis.  Mucous membranes are dry Neck: Soft, normal range of motion, supple, no masses, no thyromegaly.  Trachea midline Respiratory: clear to auscultation bilaterally, no wheezing, no crackles. Normal respiratory effort. No accessory muscle use.  Cardiovascular: Regular rate and rhythm, no murmurs / rubs / gallops. No extremity edema. 2+ pedal pulses.  Abdomen: Soft, Diffuse  tenderness, distended, no rebound or guarding.  No masses palpated. Bowel sounds hyperactive Musculoskeletal: FROM. no cyanosis. No joint deformity upper and lower extremities. Normal muscle tone.  Skin: Warm, dry, intact no rashes, lesions, ulcers. No induration Neurologic: CN 2-12 grossly intact.  Normal speech.  Sensation intact, Strength 5/5 in all extremities.   Psychiatric: Normal judgment and insight.  Normal mood.    Labs on Admission: I have personally reviewed following labs and imaging studies  CBC: Recent Labs  Lab 01/24/21 1935  WBC 15.1*  NEUTROABS 11.8*  HGB 12.3  HCT 37.0  MCV 91.1  PLT 697    Basic Metabolic Panel: Recent Labs  Lab 01/24/21 1935  NA 142  K 3.1*  CL 106  CO2 26  GLUCOSE 81  BUN 19  CREATININE 0.66  CALCIUM 8.0*    GFR: Estimated Creatinine Clearance: 61.1 mL/min (by C-G formula based on SCr of 0.66 mg/dL).  Liver Function Tests: Recent Labs  Lab 01/24/21 1935  AST 27  ALT 26  ALKPHOS 68  BILITOT 1.3*  PROT 6.0*  ALBUMIN 2.9*    Urine analysis:    Component Value Date/Time   COLORURINE YELLOW 01/24/2021 1651   APPEARANCEUR CLEAR 01/24/2021 1651   LABSPEC 1.026 01/24/2021 1651   LABSPEC 1,025 12/18/2020 1644   PHURINE 8.0 01/24/2021 1651   GLUCOSEU NEGATIVE 01/24/2021 1651   HGBUR NEGATIVE 01/24/2021 1651   BILIRUBINUR NEGATIVE 01/24/2021 1651   BILIRUBINUR small (A) 12/18/2020 1644   BILIRUBINUR n 12/04/2011 1014   KETONESUR  20 (A) 01/24/2021 1651   PROTEINUR 100 (A) 01/24/2021 1651   UROBILINOGEN negative 12/04/2011 1014   NITRITE NEGATIVE 01/24/2021 1651   LEUKOCYTESUR NEGATIVE 01/24/2021 1651    Radiological Exams on Admission: DG Abd Acute W/Chest  Result Date: 01/24/2021 CLINICAL DATA:  Abdominal pain, vomiting.  History of obstruction EXAM: DG ABDOMEN ACUTE WITH 1 VIEW CHEST COMPARISON:  01/10/2021 FINDINGS: Markedly dilated small bowel loops with air-fluid level compatible with  high-grade small bowel obstruction. Small bowel loops measure up to 8 cm in diameter. No free air organomegaly. Lungs clear. Heart is normal size. No effusions. No acute bony abnormality. IMPRESSION: Markedly dilated small bowel loops with air-fluid levels compatible with high-grade small bowel obstruction. Electronically Signed   By: Rolm Baptise M.D.   On: 01/24/2021 17:17   DG Abd Portable 1V-Small Bowel Protocol-Position Verification  Result Date: 01/24/2021 CLINICAL DATA:  Enteric catheter placement EXAM: PORTABLE ABDOMEN - 1 VIEW COMPARISON:  01/24/2021 FINDINGS: Single frontal view of the lower chest and upper abdomen demonstrates enteric catheter passing below diaphragm tip and side port projecting over the gastric antrum. Marked gaseous distention of the small bowel with multiple gas fluid levels again noted unchanged. The lung bases are clear. IMPRESSION: 1. Enteric catheter tip and side port projecting over the gastric antrum. 2. Persistent small bowel obstruction. Electronically Signed   By: Randa Ngo M.D.   On: 01/24/2021 21:18    Assessment/Plan Principal Problem:   Crohn's ileitis, with intestinal obstruction Ms. Laury is admitted to med/surg floor. NGT to LIWS has been placed and will be continued. Surgery and GI has been consulted and appreciate their assistance with care of this patient.  IVF hydration with LR provided.  Dilaudid every 3 hours as needed for pain overnight.  Antiemetic provided.  Active  Problems:   Essential hypertension Monitor BP and will provide IV antihypertensive as needed overnight for SBP> 170 Resume home antihypertensive once obstruction is resolved and patient able to tolerate p.o. intake    Hypokalemia Replete Potassium with IV runs. Check magnesium level and if low will replace.    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  Will treat with Solu-Medrol and try 5 mg twice daily for the next 2 days    Leukocytosis WBC elevated to 15,000 which could be secondary to nausea and vomiting with bowel obstruction.  There is no sign of infection. Check CBC in morning     Tobacco use Nicotine patch provided to prevent withdrawal.  Smoking cessation education to be provided before discharge     DVT prophylaxis: Lovenox for DVT prophylaxis Code Status:   Full code Family Communication:  Diagnosis and plan discussed with patient.  Patient verbalized understanding agrees with plan.  Further recommendations to follow as clinically indicated Disposition Plan:   Patient is from:  Home  Anticipated DC to:  Home  Anticipated DC date:  Anticipate more than 2 midnight stay in the hospital  Anticipated DC barriers: No barriers to discharge identified at this time  Consults called:  Gastroenterology and surgery consulted by ER physician Admission status:  Inpatient  Yevonne Aline Chotiner MD [facility]  How to contact the [facility] Attending or Consulting provider [place] or covering provider during after hours Christmas, for this patient?   1. Check the care team in [facility] and look for a) attending/consulting [facility] provider listed and b) the [facility] team listed 2. Log into [email or web address] and use [place]'s universal password to access. If you do not have the password, please contact the hospital operator. 3. Locate the [facility] provider you are looking for under [facility] and page to a number that you can  be directly reached. 4. If you still have difficulty reaching the provider, please page the [facility] (Director on Call) for the Hospitalists listed on amion for assistance.  01/24/2021, 9:57 PM

## 2021-01-25 ENCOUNTER — Inpatient Hospital Stay (HOSPITAL_COMMUNITY): Payer: 59

## 2021-01-25 DIAGNOSIS — U071 COVID-19: Secondary | ICD-10-CM | POA: Diagnosis not present

## 2021-01-25 DIAGNOSIS — K50012 Crohn's disease of small intestine with intestinal obstruction: Secondary | ICD-10-CM | POA: Diagnosis not present

## 2021-01-25 DIAGNOSIS — R601 Generalized edema: Secondary | ICD-10-CM | POA: Diagnosis not present

## 2021-01-25 LAB — CBC
HCT: 38.3 % (ref 36.0–46.0)
Hemoglobin: 12.2 g/dL (ref 12.0–15.0)
MCH: 29.9 pg (ref 26.0–34.0)
MCHC: 31.9 g/dL (ref 30.0–36.0)
MCV: 93.9 fL (ref 80.0–100.0)
Platelets: 322 10*3/uL (ref 150–400)
RBC: 4.08 MIL/uL (ref 3.87–5.11)
RDW: 15.7 % — ABNORMAL HIGH (ref 11.5–15.5)
WBC: 11.3 10*3/uL — ABNORMAL HIGH (ref 4.0–10.5)
nRBC: 0 % (ref 0.0–0.2)

## 2021-01-25 LAB — ECHOCARDIOGRAM LIMITED
Area-P 1/2: 4.8 cm2
Height: 63 in
S' Lateral: 2.9 cm
Weight: 2116.42 oz

## 2021-01-25 LAB — MAGNESIUM: Magnesium: 1.8 mg/dL (ref 1.7–2.4)

## 2021-01-25 LAB — PHOSPHORUS: Phosphorus: 1.9 mg/dL — ABNORMAL LOW (ref 2.5–4.6)

## 2021-01-25 LAB — BASIC METABOLIC PANEL
Anion gap: 11 (ref 5–15)
BUN: 17 mg/dL (ref 8–23)
CO2: 23 mmol/L (ref 22–32)
Calcium: 8.1 mg/dL — ABNORMAL LOW (ref 8.9–10.3)
Chloride: 109 mmol/L (ref 98–111)
Creatinine, Ser: 0.83 mg/dL (ref 0.44–1.00)
GFR, Estimated: >60 mL/min (ref >60)
Glucose, Bld: 103 mg/dL — ABNORMAL HIGH (ref 70–99)
Potassium: 3.1 mmol/L — ABNORMAL LOW (ref 3.5–5.1)
Sodium: 143 mmol/L (ref 135–145)

## 2021-01-25 LAB — PROTEIN / CREATININE RATIO, URINE
Creatinine, Urine: 198.93 mg/dL
Protein Creatinine Ratio: 0.26 mg/mg{Cre} — ABNORMAL HIGH (ref 0.00–0.15)
Total Protein, Urine: 51 mg/dL

## 2021-01-25 LAB — C DIFFICILE QUICK SCREEN W PCR REFLEX
C Diff antigen: NEGATIVE
C Diff interpretation: NOT DETECTED
C Diff toxin: NEGATIVE

## 2021-01-25 LAB — PREALBUMIN: Prealbumin: 9.7 mg/dL — ABNORMAL LOW (ref 18–38)

## 2021-01-25 LAB — RESP PANEL BY RT-PCR (FLU A&B, COVID) ARPGX2
Influenza A by PCR: NEGATIVE
Influenza B by PCR: NEGATIVE
SARS Coronavirus 2 by RT PCR: POSITIVE — AB

## 2021-01-25 MED ORDER — SODIUM CHLORIDE 0.9 % IV SOLN
200.0000 mg | Freq: Once | INTRAVENOUS | Status: AC
  Administered 2021-01-25: 200 mg via INTRAVENOUS
  Filled 2021-01-25: qty 40

## 2021-01-25 MED ORDER — METHYLPREDNISOLONE SODIUM SUCC 125 MG IJ SOLR
60.0000 mg | Freq: Every day | INTRAMUSCULAR | Status: DC
  Administered 2021-01-26 – 2021-01-29 (×4): 60 mg via INTRAVENOUS
  Filled 2021-01-25 (×5): qty 2

## 2021-01-25 MED ORDER — ENOXAPARIN SODIUM 40 MG/0.4ML ~~LOC~~ SOLN
40.0000 mg | SUBCUTANEOUS | Status: DC
  Administered 2021-01-25 – 2021-01-29 (×5): 40 mg via SUBCUTANEOUS
  Filled 2021-01-25 (×6): qty 0.4

## 2021-01-25 MED ORDER — BOOST / RESOURCE BREEZE PO LIQD CUSTOM
1.0000 | Freq: Three times a day (TID) | ORAL | Status: DC
  Administered 2021-01-25 – 2021-01-29 (×7): 1 via ORAL

## 2021-01-25 MED ORDER — NICOTINE 7 MG/24HR TD PT24
7.0000 mg | MEDICATED_PATCH | Freq: Every day | TRANSDERMAL | Status: DC
  Filled 2021-01-25 (×4): qty 1

## 2021-01-25 MED ORDER — ONDANSETRON HCL 4 MG PO TABS: 4.0000 mg | ORAL_TABLET | Freq: Four times a day (QID) | ORAL | Status: DC | PRN

## 2021-01-25 MED ORDER — LACTATED RINGERS IV SOLN: INTRAVENOUS | Status: DC

## 2021-01-25 MED ORDER — SODIUM CHLORIDE 0.9 % IV SOLN
100.0000 mg | Freq: Every day | INTRAVENOUS | Status: AC
  Administered 2021-01-26 – 2021-01-27 (×2): 100 mg via INTRAVENOUS
  Filled 2021-01-25 (×2): qty 20

## 2021-01-25 MED ORDER — METHYLPREDNISOLONE SODIUM SUCC 125 MG IJ SOLR
125.0000 mg | Freq: Two times a day (BID) | INTRAMUSCULAR | Status: DC
  Administered 2021-01-25: 125 mg via INTRAVENOUS
  Filled 2021-01-25: qty 2

## 2021-01-25 MED ORDER — ONDANSETRON HCL 4 MG/2ML IJ SOLN
4.0000 mg | Freq: Four times a day (QID) | INTRAMUSCULAR | Status: DC | PRN
  Administered 2021-01-26 – 2021-01-27 (×4): 4 mg via INTRAVENOUS
  Filled 2021-01-25 (×3): qty 2

## 2021-01-25 MED ORDER — ACETAMINOPHEN 325 MG PO TABS: 650.0000 mg | ORAL_TABLET | Freq: Four times a day (QID) | ORAL | Status: DC | PRN

## 2021-01-25 MED ORDER — ACETAMINOPHEN 650 MG RE SUPP: 650.0000 mg | Freq: Four times a day (QID) | RECTAL | Status: DC | PRN

## 2021-01-25 MED ORDER — POTASSIUM CHLORIDE CRYS ER 20 MEQ PO TBCR: 40.0000 meq | EXTENDED_RELEASE_TABLET | ORAL | Status: DC

## 2021-01-25 MED ORDER — POTASSIUM CHLORIDE 10 MEQ/100ML IV SOLN
10.0000 meq | INTRAVENOUS | Status: AC
  Administered 2021-01-25 (×3): 10 meq via INTRAVENOUS
  Filled 2021-01-25 (×3): qty 100

## 2021-01-25 MED ORDER — HYDROMORPHONE HCL 1 MG/ML IJ SOLN
1.0000 mg | INTRAMUSCULAR | Status: AC | PRN
  Administered 2021-01-26 – 2021-01-29 (×4): 1 mg via INTRAVENOUS
  Filled 2021-01-25 (×4): qty 1

## 2021-01-25 MED ORDER — POTASSIUM CHLORIDE CRYS ER 20 MEQ PO TBCR
40.0000 meq | EXTENDED_RELEASE_TABLET | ORAL | Status: AC
Start: 2021-01-25 — End: 2021-01-25
  Administered 2021-01-25: 40 meq via ORAL
  Filled 2021-01-25: qty 2

## 2021-01-25 NOTE — Progress Notes (Signed)
Date and time results received: 01/25/21  0352 Test: COVID-19 Critical Value:COVID positive Name of Provider Notified: Clarisa Kindred, NP Orders Received? Awaiting orders Or Actions Taken?: NP responded via secure chat

## 2021-01-25 NOTE — Progress Notes (Signed)
PROGRESS NOTE    Cynthia [name]  JGO:[id number] DOB: [birth year] DOA: 01/24/2021 PCP: Denita Lung, MD   Chief Complaint  Patient presents with  . Abdominal Pain  . Nausea  . Diarrhea   Brief Narrative:  Cynthia [name] is a 60 y.o. female with medical history significant for HTN, Crohn's disease. She was recently admitted to the hospital on March 8 and discharged on March 13 for Crohn's ileitis with intestinal obstruction. Patient states she had been doing well until yesterday. After eating she started developing nausea vomiting. Continued to have nausea and vomiting all day today and not able to tolerate anything to eat or drink. She reports having chronic diarrhea which is unchanged for her. She estimates vomiting maybe 20-25 times since yesterday. Patient is having waves of abdominal pain throughout her abdomen. Abdominal pain is severe and rated as 10/10 at worst. She does not know of aggravating factors.She now feels like she is getting more bloated and her abdomen is distended. Patient is followed by [facility] gastroenterology. She was referred to a colorectal surgeon at [facility] due to insurance reasons. She was scheduled to have her appointment tomorrow.  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  Smoking cessation education to be provided before discharge  DVT prophylaxis: lovenox Code Status: full  Family Communication: sister at bedside Disposition:   Status is: Inpatient  Remains inpatient appropriate because:Inpatient level of care appropriate due to severity of illness   Dispo: The patient is from: Home              Anticipated d/c is to: Home              Patient currently is not medically stable to d/c.   Difficult to place patient No       Consultants:   GI  Surgery  Procedures:   none  Antimicrobials: Anti-infectives (From admission, onward)   Start     Dose/Rate Route Frequency Ordered Stop   01/26/21 1000  remdesivir 100 mg in sodium chloride 0.9 % 100 mL IVPB       "Followed by" Linked Group Details   100 mg 200 mL/hr over 30 Minutes Intravenous Daily 01/25/21 1034 01/30/21 0959   01/25/21 1200  remdesivir 200 mg in sodium chloride 0.9% 250 mL IVPB       "Followed by" Linked Group Details   200 mg 580 mL/hr over 30 Minutes Intravenous Once 01/25/21 1034  Subjective: Feels less distended Lots of bowel sounds  Objective: Vitals:   01/24/21 2323 01/25/21 0042 01/25/21 0532 01/25/21 0944  BP: (!) 145/93 (!) 142/98 (!) 138/97 (!) 143/79  Pulse: 90 100 98 91  Resp: 16 20 20 16   Temp: 98.5 F (36.9 C) 98.4 F (36.9 C) 98 F (36.7 C) 98.1 F (36.7 C)  TempSrc: Oral Oral Oral Oral  SpO2: 97% 100% 97% 100%  Weight:  60 kg    Height:  5' 3"  (1.6 m)      Intake/Output Summary (Last 24 hours) at 01/25/2021 1034 Last data filed at 01/24/2021 2349 Gross  per 24 hour  Intake 2000 ml  Output 950 ml  Net 1050 ml   Filed Weights   01/24/21 1644 01/25/21 0042  Weight: 60.9 kg 60 kg    Examination:  General exam: Appears calm and comfortable  Respiratory system: unlabored Cardiovascular system: RRR Gastrointestinal system: soft, nontender, audible bowel sounds Central nervous system: Alert and oriented. No focal neurological deficits. Extremities: trace edema Skin: No rashes, lesions or ulcers Psychiatry: Judgement and insight appear normal. Mood & affect appropriate.     Data Reviewed: I have personally reviewed following labs and imaging studies  CBC: Recent Labs  Lab 01/24/21 1935 01/25/21 0543  WBC 15.1* 11.3*  NEUTROABS 11.8*  --   HGB 12.3 12.2  HCT 37.0 38.3  MCV 91.1 93.9  PLT 336 332    Basic Metabolic Panel: Recent Labs  Lab 01/24/21 1641 01/24/21 1935 01/25/21 0543  NA  --  142 143  K  --  3.1* 3.1*  CL  --  106 109  CO2  --  26 23  GLUCOSE  --  81 103*  BUN  --  19 17  CREATININE  --  0.66 0.83  CALCIUM  --  8.0* 8.1*  MG 1.9  --   --   PHOS  --   --  1.9*    GFR: Estimated Creatinine Clearance: 58.9 mL/min (by C-G formula based on SCr of 0.83 mg/dL).  Liver Function Tests: Recent Labs  Lab 01/24/21 1935  AST 27  ALT 26  ALKPHOS 68  BILITOT 1.3*  PROT 6.0*  ALBUMIN 2.9*    CBG: No results for input(s): GLUCAP in the last 168 hours.   Recent Results (from the past 240 hour(s))  Resp Panel by RT-PCR (Flu A&B, Covid) Nasopharyngeal Swab     Status: Abnormal   Collection Time: 01/24/21  5:34 PM   Specimen: Nasopharyngeal Swab; Nasopharyngeal(NP) swabs in vial transport medium  Result Value Ref Range Status   SARS Coronavirus 2 by RT PCR POSITIVE (A) NEGATIVE Final    Comment: RESULT CALLED TO, READ BACK BY AND VERIFIED WITH: RENITA, RN @ [id number] ON 01/25/21 C VARNER (NOTE) SARS-CoV-2 target nucleic acids are DETECTED.  The SARS-CoV-2 RNA is generally detectable in upper  respiratory specimens during the acute phase of infection. Positive results are indicative of the presence of the identified virus, but do not rule out bacterial infection or co-infection with other pathogens not detected by the test. Clinical correlation with patient history and other diagnostic information is necessary to determine patient infection status. The expected result is Negative.  Fact Sheet for Patients: EntrepreneurPulse.com.au  Fact Sheet for Healthcare Providers: IncredibleEmployment.be  This test is not yet approved or cleared by the [place] FDA and  has been authorized for detection and/or diagnosis of SARS-CoV-2 by FDA under an Emergency Use Authorization (EUA).  This EUA  will remain in effect (meaning this test ca n be used) for the duration of  the COVID-19 declaration under Section 564(b)(1) of the Act, 21 U.S.C. section 360bbb-3(b)(1), unless the authorization is terminated or revoked sooner.     Influenza A by PCR NEGATIVE NEGATIVE Final   Influenza B by PCR NEGATIVE NEGATIVE Final    Comment: (NOTE) The Xpert Xpress SARS-CoV-2/FLU/RSV plus assay is intended as an aid in the diagnosis of influenza from Nasopharyngeal swab specimens and should not be used as a sole basis for treatment. Nasal washings and aspirates are unacceptable for Xpert Xpress SARS-CoV-2/FLU/RSV testing.  Fact Sheet for Patients: EntrepreneurPulse.com.au  Fact Sheet for Healthcare Providers: IncredibleEmployment.be  This test is not yet approved or cleared by the [place] FDA and has been authorized for detection and/or diagnosis of SARS-CoV-2 by FDA under an Emergency Use Authorization (EUA). This EUA will remain in effect (meaning this test can be used) for the duration of the COVID-19 declaration under Section 564(b)(1) of the Act, 21 U.S.C. section 360bbb-3(b)(1), unless the authorization is  terminated or revoked.  Performed at [facility] [place] [address], [place],  [address]          Radiology Studies: DG Abd Acute W/Chest  Result Date: 01/24/2021 CLINICAL DATA:  Abdominal pain, vomiting.  History of obstruction EXAM: DG ABDOMEN ACUTE WITH 1 VIEW CHEST COMPARISON:  01/10/2021 FINDINGS: Markedly dilated small bowel loops with air-fluid level compatible with high-grade small bowel obstruction. Small bowel loops measure up to 8 cm in diameter. No free air organomegaly. Lungs clear. Heart is normal size. No effusions. No acute bony abnormality. IMPRESSION: Markedly dilated small bowel loops with air-fluid levels compatible with high-grade small bowel obstruction. Electronically Signed   By: Rolm Baptise M.D.   On: 01/24/2021 17:17   DG Abd Portable 1V-Small Bowel Obstruction Protocol-initial, 8 hr delay  Result Date: 01/25/2021 CLINICAL DATA:  Small bowel obstruction. EXAM: PORTABLE ABDOMEN - 1 VIEW COMPARISON:  January 24, 2021. FINDINGS: Severe small bowel dilatation is again noted concerning for distal small bowel obstruction. Residual contrast is noted in the nondilated colon. IMPRESSION: Severe small bowel dilatation is again noted concerning for distal small bowel obstruction. Electronically Signed   By: Marijo Conception M.D.   On: 01/25/2021 08:22   DG Abd Portable 1V-Small Bowel Protocol-Position Verification  Result Date: 01/24/2021 CLINICAL DATA:  Enteric catheter placement EXAM: PORTABLE ABDOMEN - 1 VIEW COMPARISON:  01/24/2021 FINDINGS: Single frontal view of the lower chest and upper abdomen demonstrates enteric catheter passing below diaphragm tip and side port projecting over the gastric antrum. Marked gaseous distention of the small bowel with multiple gas fluid levels again noted unchanged. The lung bases are clear. IMPRESSION: 1. Enteric catheter tip and side port projecting over the gastric antrum. 2. Persistent small bowel obstruction.  Electronically Signed   By: Randa Ngo M.D.   On: 01/24/2021 21:18        Scheduled Meds: . enoxaparin (LOVENOX) injection  40 mg Subcutaneous Q24H  . lip balm  1 application Topical BID  . [START ON 01/26/2021] methylPREDNISolone (SOLU-MEDROL) injection  60 mg Intravenous Daily  . nicotine  7 mg Transdermal Daily  . potassium chloride  40 mEq Oral Q4H   Continuous Infusions: . sodium chloride 125 mL/hr at 01/24/21 1957  . lactated ringers    . lactated ringers 125 mL/hr at 01/25/21 0106  . methocarbamol (ROBAXIN) IV    . ondansetron (ZOFRAN) IV    .  remdesivir 200 mg in sodium chloride 0.9% 250 mL IVPB     Followed by  . [START ON 01/26/2021] remdesivir 100 mg in NS 100 mL       LOS: 1 day    Time spent: over 30 min    Fayrene Helper, MD [facility]   To contact the attending provider between 7A-7P or the covering provider during after hours 7P-7A, please log into the web site [email or web address] and access using universal [place] password for that web site. If you do not have the password, please call the hospital operator.  01/25/2021, 10:34 AM

## 2021-01-25 NOTE — Consult Note (Addendum)
Consultation  Referring Provider: [facility] / [place] Primary Care Physician:  Denita Lung, MD Primary Gastroenterologist:  Dr.Stark  Reason for Consultation: Crohn's disease with small bowel obstruction, Covid -19  HPI: Cynthia [name] is a 60 y.o. female, known to Dr. Fuller Plan who was initially diagnosed with Crohn's ileitis in October 2020 at the time of colonoscopy.  She was initially treated with Humira and then switched to [facility] in November 2021.  She completed induction and has been on every 8 weeks therapy since. Patient has had 2 recent hospitalizations one in February and again 3/8 through 01/13/2021 with small bowel obstructions. 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  Patient was treated with IV steroids during that admission, improved and was discharged home on oral steroids and continuation of her Stelara.  Her last dose of Stelara was on 12/17/2020. She is just completing prednisone at 40 mg daily and was to drop to 30 mg daily. She has been waiting to see a Sports administrator, her insurance was directing her to [facility] due to coverage and she actually had an appointment with a surgeon Floyce Stakes today for initial evaluation.   Patient says she was feeling fine earlier this week and had very abrupt onset of symptoms Wednesday night 01/23/2021 with severe abdominal pain multiple episodes of nausea and vomiting.  She did not develop diarrhea until she was on her way to the hospital and has been having profuse diarrhea since which has been malodorous.  No melena or hematochezia.  Abdominal films last night showed a high-grade small  bowel obstruction.  Repeat films this morning show severe small bowel dilation consistent with persistent obstruction.  She feels much better today, says her abdominal pain is completely resolved and she is not having any nausea or vomiting however the diarrhea persists. In the midst of this she has now returned COVID-19 positive.  She has completed vaccination and says she has had a booster after every Stelara injection No fever or chills, no current respiratory symptoms or headache  Labs on admit-WBC 15.1, hemoglobin 12.3/hematocrit of 37.0 Potassium 3.1 creatinine 0.66 LFTs within normal limits  Prealbumin 9.7 WBC 11.3, hemoglobin 12.2/hematocrit 38.3  Patient and family frustrated by the fact that she cannot stay out of the hospital and well enough, long enough to get to an outpatient surgeon.   Past Medical History:  Diagnosis Date  . Anemia 2012  . Anxiety   . Bronchitis   . Crohn's ileitis ([place])   . Depression   . GERD (gastroesophageal reflux disease)   . Headache(784.0)    MIGRAINE  . Heart murmur   . Hiatal hernia   . Hypertension   . IBS (irritable bowel syndrome)   . Incontinence   . MVP (mitral valve prolapse)   . RLS (restless legs syndrome)   . Tubular adenoma of colon 08/2019    Past Surgical History:  Procedure Laterality Date  . ABDOMINAL HYSTERECTOMY  06/17/2011   Procedure: HYSTERECTOMY ABDOMINAL;  Surgeon: Arloa Koh;  Location: [facility];  Service: Gynecology;  Laterality: N/A;  Converted to Abdominal Hysterectomy with lysis of adhesions   . ANTERIOR  AND POSTERIOR REPAIR  02/25/2012   Procedure: ANTERIOR (CYSTOCELE) AND POSTERIOR REPAIR (RECTOCELE);  Surgeon: Daria Pastures, MD;  Location: [facility];  Service: Gynecology;  Laterality: N/A;  ANterior cystocele repair ONLY  . BIOPSY  08/29/2019   Procedure: BIOPSY;  Surgeon: Lavena Bullion, DO;  Location: [facility];  Service: Gastroenterology;;  . BLADDER SUSPENSION  02/25/2012   Procedure:  TRANSVAGINAL TAPE (TVT) PROCEDURE;  Surgeon: Daria Pastures, MD;  Location: [facility];  Service: Gynecology;  Laterality: N/A;  . COLONOSCOPY WITH PROPOFOL N/A 08/29/2019   Procedure: COLONOSCOPY WITH PROPOFOL;  Surgeon: Lavena Bullion, DO;  Location: [facility];  Service: Gastroenterology;  Laterality: N/A;  . CYSTOSCOPY  02/25/2012   Procedure: CYSTOSCOPY;  Surgeon: Daria Pastures, MD;  Location: [facility];  Service: Gynecology;  Laterality: N/A;  . KNEE ARTHROSCOPY  2010  . LAPAROSCOPIC ASSISTED VAGINAL HYSTERECTOMY  06/17/2011   Procedure: LAPAROSCOPIC ASSISTED VAGINAL HYSTERECTOMY;  Surgeon: Arloa Koh;  Location: [facility];  Service: Gynecology;  Laterality: N/A;  Attempted   . POLYPECTOMY  08/29/2019   Procedure: POLYPECTOMY;  Surgeon: Lavena Bullion, DO;  Location: [facility];  Service: Gastroenterology;;  . SALPINGOOPHORECTOMY  06/17/2011   Procedure: SALPINGO OOPHERECTOMY;  Surgeon: Arloa Koh;  Location: [facility];  Service: Gynecology;  Laterality: Bilateral;  . TONSILLECTOMY      Prior to Admission medications   Medication Sig Start Date End Date Taking? Authorizing Provider  diphenhydramine-acetaminophen (TYLENOL PM) 25-500 MG TABS tablet Take 2 tablets by mouth at bedtime as needed (sleep/pain).    [provider]  esomeprazole (NEXIUM) 40 MG capsule TAKE ONE (1) CAPSULE BY MOUTH 2 TIMES DAILY Patient taking differently: Take 40 mg by mouth 2 (two) times daily before a meal. 07/03/20   Antonieta Pert, MD  hyoscyamine (LEVSIN) 0.125 MG tablet Take 2 tablets (0.25 mg total) by mouth every 4 (four) hours as needed for cramping. 12/25/20   [name], MD  losartan-hydrochlorothiazide (HYZAAR) 100-12.5 MG tablet Take 1 tablet by mouth daily. 08/03/20   Denita Lung, MD  ondansetron (ZOFRAN-ODT) 4 MG disintegrating tablet Take 1 tablet (4 mg total) by mouth every 8 (eight) hours as needed for nausea or vomiting. 01/13/21   Rai, Ripudeep K, MD  phenylephrine  (NEO-SYNEPHRINE) 1 % nasal spray Place 1 drop into both nostrils every 6 (six) hours as needed for congestion. 12/09/20   Cristal Deer, MD  polycarbophil (FIBERCON) 625 MG tablet Take 1 tablet (625 mg total) by mouth 2 (two) times daily. 01/13/21   Rai, Vernelle Emerald, MD  pramipexole (MIRAPEX) 1 MG tablet Take 1 tablet by mouth daily Patient taking differently: Take 1 mg by mouth daily. 03/26/20   Denita Lung, MD  predniSONE (DELTASONE) 10 MG tablet Please take prednisone 10 mg twice daily for 2 weeks. Decrease prednisone dose by 5 mg every 2 weeks 01/13/21   Rai, Vernelle Emerald, MD  venlafaxine XR (EFFEXOR-XR) 150 MG 24 hr capsule TAKE 2 CAPSULES BY MOUTH EVERY DAY Patient not taking: Reported on 01/24/2021 07/26/20   Denita Lung, MD    Current Facility-Administered Medications  Medication Dose Route Frequency Provider Last Rate Last Admin  . 0.9 %  sodium chloride infusion   Intravenous Continuous Dorie Rank, MD 125 mL/hr at 01/24/21 1957 New Bag at 01/24/21 1957  . acetaminophen (TYLENOL) tablet 650 mg  650 mg Oral Q6H PRN Chotiner, Yevonne Aline, MD       Or  . acetaminophen (  TYLENOL) suppository 650 mg  650 mg Rectal Q6H PRN Chotiner, Yevonne Aline, MD      . bisacodyl (DULCOLAX) suppository 10 mg  10 mg Rectal Q12H PRN Michael Boston, MD      . diphenhydrAMINE (BENADRYL) injection 12.5-25 mg  12.5-25 mg Intravenous Q6H PRN Michael Boston, MD      . enoxaparin (LOVENOX) injection 40 mg  40 mg Subcutaneous Q24H Chotiner, Yevonne Aline, MD      . HYDROmorphone (DILAUDID) injection 0.5 mg  0.5 mg Intravenous Q30 min PRN Dorie Rank, MD   0.5 mg at 01/24/21 1953  . HYDROmorphone (DILAUDID) injection 1 mg  1 mg Intravenous Q3H PRN Chotiner, Yevonne Aline, MD      . lactated ringers bolus 1,000 mL  1,000 mL Intravenous Q8H PRN Michael Boston, MD      . lactated ringers infusion   Intravenous Continuous Chotiner, Yevonne Aline, MD 125 mL/hr at 01/25/21 0106 New Bag at 01/25/21 0106  . lip balm (CARMEX) ointment 1  application  1 application Topical BID Michael Boston, MD   1 application at 16/10/96 2138  . magic mouthwash  15 mL Oral QID PRN Michael Boston, MD      . methocarbamol (ROBAXIN) 1,000 mg in dextrose 5 % 100 mL IVPB  1,000 mg Intravenous Q6H PRN Michael Boston, MD      . methylPREDNISolone sodium succinate (SOLU-MEDROL) 125 mg/2 mL injection 125 mg  125 mg Intravenous Q12H Chotiner, Yevonne Aline, MD   125 mg at 01/25/21 0454  . metoprolol tartrate (LOPRESSOR) injection 5 mg  5 mg Intravenous Q6H PRN Michael Boston, MD      . nicotine (NICODERM CQ - dosed in mg/24 hr) patch 7 mg  7 mg Transdermal Daily Chotiner, Yevonne Aline, MD      . ondansetron [facility]) injection 4 mg  4 mg Intravenous Q6H PRN Michael Boston, MD       Or  . ondansetron (ZOFRAN) 8 mg in sodium chloride 0.9 % 50 mL IVPB  8 mg Intravenous Q6H PRN Michael Boston, MD      . ondansetron (ZOFRAN) tablet 4 mg  4 mg Oral Q6H PRN Chotiner, Yevonne Aline, MD       Or  . ondansetron (ZOFRAN) injection 4 mg  4 mg Intravenous Q6H PRN Chotiner, Yevonne Aline, MD      . potassium chloride SA (KLOR-CON) CR tablet 40 mEq  40 mEq Oral Q4H Elodia Florence., MD      . prochlorperazine (COMPAZINE) injection 5-10 mg  5-10 mg Intravenous Q4H PRN Michael Boston, MD        Allergies as of 01/24/2021 - Review Complete 01/24/2021  Allergen Reaction Noted  . Iodine  03/17/2008    Family History  Problem Relation Age of Onset  . Diabetes Mother   . Dementia Mother   . Clotting disorder Father   . Macular degeneration Father   . Crohn's disease Sister   . Colon cancer Neg Hx   . Esophageal cancer Neg Hx   . Pancreatic cancer Neg Hx   . Stomach cancer Neg Hx   . Liver disease Neg Hx     Social History   Socioeconomic History  . Marital status: Widowed    Spouse name: Not on file  . Number of children: 1  . Years of education: Not on file  . Highest education level: Not on file  Occupational History  . Occupation: Designer, television/film set: [employer]  Tobacco Use  . Smoking status: Current Every Day Smoker    Packs/day: 0.50    Types: Cigarettes  . Smokeless tobacco: Never Used  . Tobacco comment: none in 2 weeks as of 12/04/20  Vaping Use  . Vaping Use: Former  Substance and Sexual Activity  . Alcohol use: Not Currently  . Drug use: Yes    Types: Cocaine    Comment: socially; d/c 3 weeks ago per patient 05/31/20  . Sexual activity: Not Currently  Other Topics Concern  . Not on file  Social History Narrative   Lives with boyfriend who she states is an alcoholic   Social Determinants of Radio broadcast assistant Strain: Not on file  Food Insecurity: Not on file  Transportation Needs: Not on file  Physical Activity: Not on file  Stress: Not on file  Social Connections: Not on file  Intimate Partner Violence: Not on file    Review of Systems: Pertinent positive and negative review of systems were noted in the above HPI section.  All other review of systems was otherwise negative.  Physical Exam: Vital signs in last 24 hours: Temp:  [98 F (36.7 C)-98.7 F (37.1 C)] 98.1 F (36.7 C) (03/25 0944) Pulse Rate:  [90-106] 91 (03/25 0944) Resp:  [12-20] 16 (03/25 0944) BP: (121-145)/(79-98) 143/79 (03/25 0944) SpO2:  [95 %-100 %] 100 % (03/25 0944) Weight:  [60 kg-60.9 kg] 60 kg (03/25 0042) Last BM Date: 01/25/21 General:   Alert,  Well-developed, well-nourished, white female pleasant and cooperative in NAD.  Sister at bedside  head:  Normocephalic and atraumatic. Eyes:  Sclera clear, no icterus.   Conjunctiva pink. Ears:  Normal auditory acuity. Nose:  No deformity, discharge,  or lesions. Mouth:  No deformity or lesions.   Neck:  Supple; no masses or thyromegaly. Lungs:  Clear throughout to auscultation.   No wheezes, crackles, or rhonchi. Heart:  Regular rate and rhythm; no murmurs, clicks, rubs,  or gallops. Abdomen:  Soft,nontender, BS active,nonpalp mass or hsm.   Rectal: Not done Msk:  Symmetrical  without gross deformities. . Pulses:  Normal pulses noted. Extremities:  Without clubbing or edema. Neurologic:  Alert and  oriented x4;  grossly normal neurologically. Skin:  Intact without significant lesions or rashes.. Psych:  Alert and cooperative. Normal mood and affect.  Intake/Output from previous day: 03/24 0701 - 03/25 0700 In: 2000 [IV Piggyback:2000] Out: 950 [Emesis/NG output:450] Intake/Output this shift: No intake/output data recorded.  Lab Results: Recent Labs    01/24/21 1935 01/25/21 0543  WBC 15.1* 11.3*  HGB 12.3 12.2  HCT 37.0 38.3  PLT 336 322   BMET Recent Labs    01/24/21 1935 01/25/21 0543  NA 142 143  K 3.1* 3.1*  CL 106 109  CO2 26 23  GLUCOSE 81 103*  BUN 19 17  CREATININE 0.66 0.83  CALCIUM 8.0* 8.1*   LFT Recent Labs    01/24/21 1935  PROT 6.0*  ALBUMIN 2.9*  AST 27  ALT 26  ALKPHOS 68  BILITOT 1.3*   PT/INR No results for input(s): LABPROT, INR in the last 72 hours. Hepatitis Panel No results for input(s): HEPBSAG, HCVAB, HEPAIGM, HEPBIGM in the last 72 hours.    IMPRESSION:  #52 60 year old female diagnosed with Crohn's ileitis October 2020, now with recurring small bowel obstruction secondary to stricture of the terminal ileum, with 2 recent admissions. 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  Readmitted last  night with abrupt onset of severe abdominal pain nausea vomiting, followed by profuse diarrhea. The profuse diarrhea is atypical for her presentations with small bowel obstruction  She is also Covid positive-some of these GI symptoms may be secondary to Covid Will also need to rule out C. difficile in immunosuppressed patient  Fortunately clinically the high-grade obstructions appears to be opening up she is no longer having nausea vomiting or abdominal pain  Patient has been on Stelara since November 2021, and has required steroids much of that time as well.  She was on prednisone 40  mg p.o. daily at home prior to this admission.  #2 hypokalemia secondary to above #3 status post hysterectomy #4 GERD 5.  History of adenomatous colon polyps  PLAN: Clear liquid diet Stool for C. difficile PCR, GI path panel Hospitalist planning to initiate remdesivir Continue IV Solu-Medrol-decrease to 60 mg IV daily Patient needs to have surgery in the near future, she is failing medical management.  We will need to get her through the next couple of weeks as now with [id number] complicating the picture. She had an appointment today with a colorectal surgeon in [place] associated with [facility].  She needs a dedicated IBD surgeon at tertiary care center Will discuss further with team, thank you we will follow with you   Amy EsterwoodPA-C  01/25/2021, 10:09 AM

## 2021-01-25 NOTE — Progress Notes (Signed)
  Echocardiogram 2D Echocardiogram has been performed.  Cynthia [name] 01/25/2021, 2:44 PM

## 2021-01-25 NOTE — Progress Notes (Signed)
Progress Note     Subjective: Patient reports abdominal pain is currently resolved. She is no longer nauseated or vomiting. She is having significant diarrhea at this point with some incontinence. She had bilious drainage in NGT cannister prior to NGT coming out overnight. She and her sister are frustrated with overall disease process.   Objective: Vital signs in last 24 hours: Temp:  [98 F (36.7 C)-98.7 F (37.1 C)] 98.1 F (36.7 C) (03/25 0944) Pulse Rate:  [90-106] 91 (03/25 0944) Resp:  [12-20] 16 (03/25 0944) BP: (121-145)/(79-98) 143/79 (03/25 0944) SpO2:  [95 %-100 %] 100 % (03/25 0944) Weight:  [60 kg-60.9 kg] 60 kg (03/25 0042) Last BM Date: 01/25/21  Intake/Output from previous day: 03/24 0701 - 03/25 0700 In: 2000 [IV Piggyback:2000] Out: 950 [Emesis/NG output:450] Intake/Output this shift: No intake/output data recorded.  PE: General: pleasant, WD, thin female who is sitting in wheelchair  Heart: regular, rate, and rhythm.   Lungs: CTAB, no wheezes, rhonchi, or rales noted.  Respiratory effort nonlabored Abd: soft, NT, ND, +BS, no masses, hernias, or organomegaly MS: all 4 extremities are symmetrical with no cyanosis, clubbing, or edema. Skin: warm and dry with no masses, lesions, or rashes Neuro: Cranial nerves 2-12 grossly intact, sensation is normal throughout Psych: A&Ox3 with an anxious affect.    Lab Results:  Recent Labs    01/24/21 1935 01/25/21 0543  WBC 15.1* 11.3*  HGB 12.3 12.2  HCT 37.0 38.3  PLT 336 322   BMET Recent Labs    01/24/21 1935 01/25/21 0543  NA 142 143  K 3.1* 3.1*  CL 106 109  CO2 26 23  GLUCOSE 81 103*  BUN 19 17  CREATININE 0.66 0.83  CALCIUM 8.0* 8.1*   PT/INR No results for input(s): LABPROT, INR in the last 72 hours. CMP     Component Value Date/Time   NA 143 01/25/2021 0543   NA 140 12/18/2020 1548   K 3.1 (L) 01/25/2021 0543   CL 109 01/25/2021 0543   CO2 23 01/25/2021 0543   GLUCOSE 103 (H)  01/25/2021 0543   BUN 17 01/25/2021 0543   BUN 11 12/18/2020 1548   CREATININE 0.83 01/25/2021 0543   CREATININE 0.94 05/08/2016 0741   CALCIUM 8.1 (L) 01/25/2021 0543   PROT 6.0 (L) 01/24/2021 1935   PROT 6.2 12/18/2020 1548   ALBUMIN 2.9 (L) 01/24/2021 1935   ALBUMIN 3.6 (L) 12/18/2020 1548   AST 27 01/24/2021 1935   ALT 26 01/24/2021 1935   ALKPHOS 68 01/24/2021 1935   BILITOT 1.3 (H) 01/24/2021 1935   BILITOT 0.7 12/18/2020 1548   GFRNONAA >60 01/25/2021 0543   GFRAA 77 12/18/2020 1548   Lipase     Component Value Date/Time   LIPASE 19 01/24/2021 1935       Studies/Results: DG Abd Acute W/Chest  Result Date: 01/24/2021 CLINICAL DATA:  Abdominal pain, vomiting.  History of obstruction EXAM: DG ABDOMEN ACUTE WITH 1 VIEW CHEST COMPARISON:  01/10/2021 FINDINGS: Markedly dilated small bowel loops with air-fluid level compatible with high-grade small bowel obstruction. Small bowel loops measure up to 8 cm in diameter. No free air organomegaly. Lungs clear. Heart is normal size. No effusions. No acute bony abnormality. IMPRESSION: Markedly dilated small bowel loops with air-fluid levels compatible with high-grade small bowel obstruction. Electronically Signed   By: Rolm Baptise M.D.   On: 01/24/2021 17:17   DG Abd Portable 1V-Small Bowel Obstruction Protocol-initial, 8 hr delay  Result Date: 01/25/2021  CLINICAL DATA:  Small bowel obstruction. EXAM: PORTABLE ABDOMEN - 1 VIEW COMPARISON:  January 24, 2021. FINDINGS: Severe small bowel dilatation is again noted concerning for distal small bowel obstruction. Residual contrast is noted in the nondilated colon. IMPRESSION: Severe small bowel dilatation is again noted concerning for distal small bowel obstruction. Electronically Signed   By: Marijo Conception M.D.   On: 01/25/2021 08:22   DG Abd Portable 1V-Small Bowel Protocol-Position Verification  Result Date: 01/24/2021 CLINICAL DATA:  Enteric catheter placement EXAM: PORTABLE ABDOMEN - 1  VIEW COMPARISON:  01/24/2021 FINDINGS: Single frontal view of the lower chest and upper abdomen demonstrates enteric catheter passing below diaphragm tip and side port projecting over the gastric antrum. Marked gaseous distention of the small bowel with multiple gas fluid levels again noted unchanged. The lung bases are clear. IMPRESSION: 1. Enteric catheter tip and side port projecting over the gastric antrum. 2. Persistent small bowel obstruction. Electronically Signed   By: Randa Ngo M.D.   On: 01/24/2021 21:18    Anti-infectives: Anti-infectives (From admission, onward)   None       Assessment/Plan HTN Hiatal hernia  Mitral valve prolapse RLS GERD Depression/anxiety  Crohn's disease with PSBO secondary to possible stricture  - NGT was placed overnight but came out, patient is not nauseated or vomiting this AM and is having diarrhea, can hold on reinsertion - patient has been discussed in GI IBD conference and it sounds like the consensus was that she has not had maximal medical therapy yet and surgery was felt not be indicated at this point - I do not see any indication for emergent surgical intervention given that patient is not acutely obstructed - GI to see today, medical management of Crohn's per GI - ok to have CLD and ensure from a surgical standpoint  - we will continue to follow   FEN: CLD, IVF VTE: lovenox ID: no current abx  LOS: 1 day    Norm Parcel , [facility] 01/25/2021, 9:56 AM Please see Amion for pager number during day hours 7:00am-4:30pm

## 2021-01-25 NOTE — Progress Notes (Addendum)
Initial Nutrition Assessment  INTERVENTION:   -Boost Breeze po TID, each supplement provides 250 kcal and 9 grams of protein  NUTRITION DIAGNOSIS:   Increased nutrient needs related to  (Crohn's disease) as evidenced by estimated needs.  GOAL:   Patient will meet greater than or equal to 90% of their needs  MONITOR:   PO intake,Supplement acceptance,Labs,Weight trends,I & O's  REASON FOR ASSESSMENT:   Malnutrition Screening Tool    ASSESSMENT:   60 y.o. female with medical history significant for HTN, Crohn's disease.  She was recently admitted to the hospital on March 8 and discharged on March 13 for Crohn's ileitis with intestinal obstruction. Patient states she had been doing well until yesterday.  After eating she started developing nausea vomiting. Continued to have nausea and vomiting all day today and not able to tolerate anything to eat or drink. She reports having chronic diarrhea which is unchanged for her.  She estimates vomiting maybe 20-25 times since yesterday.  Pt is COVID-19+ incidentally.  Patient has had poor PO d/t chronic diarrhea and recent onset of N/V PTA (started 3/23). Pt was recently admitted for obstruction, was discharged on 3/18.   Per GI, pt is failing medical therapy. Surgery to be considered. N/V has resolved at this time.  Pt on clear liquids, will order Boost Breeze. Has NGT in place set to suction.  Per weight records, pt has lost 36 lbs since 08/03/20 (21% wt loss x 5.5 months, significant for time frame).  Medications: KLOR-CON, Lactated ringers  Labs reviewed: Low K, Phos  NUTRITION - FOCUSED PHYSICAL EXAM:  Unable to complete  Diet Order:   Diet Order            Diet clear liquid Room service appropriate? Yes; Fluid consistency: Thin  Diet effective now                 EDUCATION NEEDS:   No education needs have been identified at this time  Skin:  Skin Assessment: Reviewed RN Assessment  Last BM:  3/25 -type  7  Height:   Ht Readings from Last 1 Encounters:  01/25/21 5' 3"  (1.6 m)    Weight:   Wt Readings from Last 1 Encounters:  01/25/21 60 kg   BMI:  Body mass index is 23.43 kg/m.  Estimated Nutritional Needs:   Kcal:  1600-1800  Protein:  75-90g  Fluid:  1.8L/day  Clayton Bibles, MS, RD, LDN Inpatient Clinical Dietitian Contact information available via Amion

## 2021-01-26 LAB — GASTROINTESTINAL PANEL BY PCR, STOOL (REPLACES STOOL CULTURE)

## 2021-01-26 LAB — COMPREHENSIVE METABOLIC PANEL
ALT: 18 U/L (ref 0–44)
AST: 14 U/L — ABNORMAL LOW (ref 15–41)
Albumin: 2.2 g/dL — ABNORMAL LOW (ref 3.5–5.0)
Alkaline Phosphatase: 50 U/L (ref 38–126)
Anion gap: 5 (ref 5–15)
BUN: 10 mg/dL (ref 8–23)
CO2: 23 mmol/L (ref 22–32)
Calcium: 7.8 mg/dL — ABNORMAL LOW (ref 8.9–10.3)
Chloride: 109 mmol/L (ref 98–111)
Creatinine, Ser: 0.54 mg/dL (ref 0.44–1.00)
GFR, Estimated: >60 mL/min (ref >60)
Glucose, Bld: 76 mg/dL (ref 70–99)
Potassium: 3 mmol/L — ABNORMAL LOW (ref 3.5–5.1)
Sodium: 137 mmol/L (ref 135–145)
Total Bilirubin: 0.7 mg/dL (ref 0.3–1.2)
Total Protein: 4.5 g/dL — ABNORMAL LOW (ref 6.5–8.1)

## 2021-01-26 LAB — CBC WITH DIFFERENTIAL/PLATELET
Abs Immature Granulocytes: 0.03 10*3/uL (ref 0.00–0.07)
Basophils Absolute: 0 10*3/uL (ref 0.0–0.1)
Basophils Relative: 0 %
Eosinophils Absolute: 0.1 10*3/uL (ref 0.0–0.5)
Eosinophils Relative: 2 %
HCT: 32.1 % — ABNORMAL LOW (ref 36.0–46.0)
Hemoglobin: 10.4 g/dL — ABNORMAL LOW (ref 12.0–15.0)
Immature Granulocytes: 0 %
Lymphocytes Relative: 29 %
Lymphs Abs: 2.4 10*3/uL (ref 0.7–4.0)
MCH: 29.8 pg (ref 26.0–34.0)
MCHC: 32.4 g/dL (ref 30.0–36.0)
MCV: 92 fL (ref 80.0–100.0)
Monocytes Absolute: 0.5 10*3/uL (ref 0.1–1.0)
Monocytes Relative: 6 %
Neutro Abs: 5.1 10*3/uL (ref 1.7–7.7)
Neutrophils Relative %: 63 %
Platelets: 246 10*3/uL (ref 150–400)
RBC: 3.49 MIL/uL — ABNORMAL LOW (ref 3.87–5.11)
RDW: 15.7 % — ABNORMAL HIGH (ref 11.5–15.5)
WBC: 8.1 10*3/uL (ref 4.0–10.5)
nRBC: 0 % (ref 0.0–0.2)

## 2021-01-26 LAB — PHOSPHORUS: Phosphorus: 1.4 mg/dL — ABNORMAL LOW (ref 2.5–4.6)

## 2021-01-26 LAB — MAGNESIUM: Magnesium: 1.5 mg/dL — ABNORMAL LOW (ref 1.7–2.4)

## 2021-01-26 MED ORDER — K PHOS MONO-SOD PHOS DI & MONO 155-852-130 MG PO TABS
500.0000 mg | ORAL_TABLET | Freq: Four times a day (QID) | ORAL | Status: AC
  Administered 2021-01-26 – 2021-01-27 (×8): 500 mg via ORAL
  Filled 2021-01-26 (×8): qty 2

## 2021-01-26 MED ORDER — ALUM & MAG HYDROXIDE-SIMETH 200-200-20 MG/5ML PO SUSP
15.0000 mL | ORAL | Status: DC | PRN
  Administered 2021-01-26 – 2021-01-29 (×7): 15 mL via ORAL
  Filled 2021-01-26 (×7): qty 30

## 2021-01-26 MED ORDER — PANTOPRAZOLE SODIUM 40 MG IV SOLR
40.0000 mg | INTRAVENOUS | Status: DC
  Administered 2021-01-26 – 2021-01-28 (×3): 40 mg via INTRAVENOUS
  Filled 2021-01-26 (×3): qty 40

## 2021-01-26 MED ORDER — VENLAFAXINE HCL ER 150 MG PO CP24
300.0000 mg | ORAL_CAPSULE | Freq: Every day | ORAL | Status: DC
  Administered 2021-01-26 – 2021-01-30 (×5): 300 mg via ORAL
  Filled 2021-01-26 (×5): qty 2

## 2021-01-26 MED ORDER — MAGNESIUM SULFATE 2 GM/50ML IV SOLN
2.0000 g | Freq: Once | INTRAVENOUS | Status: AC
  Administered 2021-01-26: 2 g via INTRAVENOUS
  Filled 2021-01-26: qty 50

## 2021-01-26 MED ORDER — POTASSIUM CHLORIDE CRYS ER 20 MEQ PO TBCR
40.0000 meq | EXTENDED_RELEASE_TABLET | ORAL | Status: AC
  Administered 2021-01-26 (×2): 40 meq via ORAL
  Filled 2021-01-26 (×2): qty 2

## 2021-01-26 NOTE — Progress Notes (Signed)
PROGRESS NOTE    Cynthia [name]  HKV:[id number] DOB: Sep 03, 1961 DOA: 01/24/2021 PCP: Denita Lung, MD   Chief Complaint  Patient presents with  . Abdominal Pain  . Nausea  . Diarrhea   Brief Narrative:  Cynthia [name] is Cynthia [name] 60 y.o. female with medical history significant for HTN, Crohn's disease. She was recently admitted to the hospital on March 8 and discharged on March 13 for Crohn's ileitis with intestinal obstruction. Patient states she had been doing well until yesterday. After eating she started developing nausea vomiting. Continued to have nausea and vomiting all day today and not able to tolerate anything to eat or drink. She reports having chronic diarrhea which is unchanged for her. She estimates vomiting maybe 20-25 times since yesterday. Patient is having waves of abdominal pain throughout her abdomen. Abdominal pain is severe and rated as 10/10 at worst. She does not know of aggravating factors.She now feels like she is getting more bloated and her abdomen is distended. Patient is followed by [facility] gastroenterology. She was referred to Raul Winterhalter colorectal surgeon at [facility] due to insurance reasons. She was scheduled to have her appointment tomorrow.  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  Per GI, she'd benefit from surgical resection, though question is timing of this with COVID infection.  Pt/family looking into insurance issues? 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  Smoking cessation education to be provided before discharge  DVT prophylaxis: lovenox Code Status: full  Family Communication: sister at bedside Disposition:   Status is: Inpatient  Remains inpatient appropriate because:Inpatient level of care appropriate due to severity of illness   Dispo: The patient is from: Home              Anticipated d/c is to: Home              Patient currently is not medically stable to d/c.   Difficult to place patient No       Consultants:   GI  Surgery  Procedures:  Echo IMPRESSIONS    1. Left ventricular ejection fraction, by estimation, is 60 to 65%. The  left ventricle has normal function. The left ventricle has no regional  wall motion abnormalities. Left ventricular diastolic parameters are  consistent with Grade I diastolic  dysfunction (impaired relaxation).  2. Right ventricular systolic  function is normal. The right ventricular  size is normal.  3. The mitral valve is normal in structure. No evidence of mitral valve  regurgitation. No evidence of mitral stenosis.  4. The aortic valve is normal in structure. Aortic valve regurgitation is  not visualized. No aortic stenosis is present.  5. The  inferior vena cava is normal in size with greater than 50%  respiratory variability, suggesting right atrial pressure of 3 mmHg.   Antimicrobials: Anti-infectives (From admission, onward)   Start     Dose/Rate Route Frequency Ordered Stop   01/26/21 1000  remdesivir 100 mg in sodium chloride 0.9 % 100 mL IVPB       "Followed by" Linked Group Details   100 mg 200 mL/hr over 30 Minutes Intravenous Daily 01/25/21 1034 01/28/21 0959   01/25/21 1200  remdesivir 200 mg in sodium chloride 0.9% 250 mL IVPB       "Followed by" Linked Group Details   200 mg 580 mL/hr over 30 Minutes Intravenous Once 01/25/21 1034 01/25/21 1555         Subjective: Persistent diarrhea C/o indigestion  Objective: Vitals:   01/25/21 1332 01/25/21 1835 01/25/21 2145 01/26/21 0529  BP: 140/88 (!) 135/94 (!) 146/87 (!) 145/93  Pulse: 86 80 87 78  Resp: 16 16 18 16   Temp: 98.3 F (36.8 C) 98.4 F (36.9 C) 98.6 F (37 C) 98 F (36.7 C)  TempSrc: Oral Oral Oral Oral  SpO2: 100% 100% 100% 100%  Weight:      Height:        Intake/Output Summary (Last 24 hours) at 01/26/2021 1635 Last data filed at 01/26/2021 0901 Gross per 24 hour  Intake 1683 ml  Output 300 ml  Net 1383 ml   Filed Weights   01/24/21 1644 01/25/21 0042  Weight: 60.9 kg 60 kg    Examination:  General: No acute distress. Cardiovascular: Heart sounds show Valora Norell regular rate, and rhythm. Lungs: Clear to auscultation bilaterally Abdomen: Soft, nontender, mildly distended Neurological: Alert and oriented 3. Moves all extremities 4. Cranial nerves II through XII grossly intact. Skin: Warm and dry. No rashes or lesions. Extremities: No clubbing or cyanosis. No edema.   Data Reviewed: I have personally reviewed following labs and imaging studies  CBC: Recent Labs  Lab 01/24/21 1935 01/25/21 0543 01/26/21 0535  WBC 15.1* 11.3* 8.1  NEUTROABS 11.8*  --  5.1  HGB 12.3 12.2 10.4*  HCT 37.0 38.3 32.1*  MCV 91.1 93.9 92.0  PLT 336  322 672    Basic Metabolic Panel: Recent Labs  Lab 01/24/21 1641 01/24/21 1935 01/25/21 0543 01/26/21 0535  NA  --  142 143 137  K  --  3.1* 3.1* 3.0*  CL  --  106 109 109  CO2  --  26 23 23   GLUCOSE  --  81 103* 76  BUN  --  19 17 10   CREATININE  --  0.66 0.83 0.54  CALCIUM  --  8.0* 8.1* 7.8*  MG 1.9  --  1.8 1.5*  PHOS  --   --  1.9* 1.4*    GFR: Estimated Creatinine Clearance: 61.1 mL/min (by C-G formula based on SCr of 0.54 mg/dL).  Liver Function Tests: Recent Labs  Lab 01/24/21 1935 01/26/21 0535  AST 27 14*  ALT 26 18  ALKPHOS 68 50  BILITOT 1.3* 0.7  PROT 6.0* 4.5*  ALBUMIN 2.9* 2.2*    CBG: No results for input(s): GLUCAP in the last  168 hours.   Recent Results (from the past 240 hour(s))  Resp Panel by RT-PCR (Flu Anaclara Acklin&B, Covid) Nasopharyngeal Swab     Status: Abnormal   Collection Time: 01/24/21  5:34 PM   Specimen: Nasopharyngeal Swab; Nasopharyngeal(NP) swabs in vial transport medium  Result Value Ref Range Status   SARS Coronavirus 2 by RT PCR POSITIVE ([place]) NEGATIVE Final    Comment: RESULT CALLED TO, READ BACK BY AND VERIFIED WITH: RENITA, RN @ 8264 ON 01/25/21 C VARNER (NOTE) SARS-CoV-2 target nucleic acids are DETECTED.  The SARS-CoV-2 RNA is generally detectable in upper respiratory specimens during the acute phase of infection. Positive results are indicative of the presence of the identified virus, but do not rule out bacterial infection or co-infection with other pathogens not detected by the test. Clinical correlation with patient history and other diagnostic information is necessary to determine patient infection status. The expected result is Negative.  Fact Sheet for Patients: EntrepreneurPulse.com.au  Fact Sheet for Healthcare Providers: IncredibleEmployment.be  This test is not yet approved or cleared by the [place] FDA and  has been authorized for detection and/or diagnosis of  SARS-CoV-2 by FDA under an Emergency Use Authorization (EUA).  This EUA will remain in effect (meaning this test ca n be used) for the duration of  the COVID-19 declaration under Section 564(b)(1) of the Act, 21 U.S.C. section 360bbb-3(b)(1), unless the authorization is terminated or revoked sooner.     Influenza Nyala Kirchner by PCR NEGATIVE NEGATIVE Final   Influenza B by PCR NEGATIVE NEGATIVE Final    Comment: (NOTE) The Xpert Xpress SARS-CoV-2/FLU/RSV plus assay is intended as an aid in the diagnosis of influenza from Nasopharyngeal swab specimens and should not be used as Saren Corkern sole basis for treatment. Nasal washings and aspirates are unacceptable for Xpert Xpress SARS-CoV-2/FLU/RSV testing.  Fact Sheet for Patients: EntrepreneurPulse.com.au  Fact Sheet for Healthcare Providers: IncredibleEmployment.be  This test is not yet approved or cleared by the [place] FDA and has been authorized for detection and/or diagnosis of SARS-CoV-2 by FDA under an Emergency Use Authorization (EUA). This EUA will remain in effect (meaning this test can be used) for the duration of the COVID-19 declaration under Section 564(b)(1) of the Act, 21 U.S.C. section 360bbb-3(b)(1), unless the authorization is terminated or revoked.  Performed at [facility], [place] [address], [place], [place] [address]   C Difficile Quick Screen w PCR reflex     Status: None   Collection Time: 01/25/21 12:13 PM   Specimen: STOOL  Result Value Ref Range Status   C Diff antigen NEGATIVE NEGATIVE Final   C Diff toxin NEGATIVE NEGATIVE Final   C Diff interpretation No C. difficile detected.  Final    Comment: Performed at [facility], [place] [address], [place], [place] [address]  Gastrointestinal Panel by PCR , Stool     Status: None   Collection Time: 01/25/21 12:13 PM   Specimen: STOOL  Result Value Ref Range Status   Campylobacter species NOT  DETECTED NOT DETECTED Final   Plesimonas shigelloides NOT DETECTED NOT DETECTED Final   Salmonella species NOT DETECTED NOT DETECTED Final   Yersinia enterocolitica NOT DETECTED NOT DETECTED Final   Vibrio species NOT DETECTED NOT DETECTED Final   Vibrio cholerae NOT DETECTED NOT DETECTED Final   Enteroaggregative E coli (EAEC) NOT DETECTED NOT DETECTED Final   Enteropathogenic E coli (EPEC) NOT DETECTED NOT DETECTED Final   Enterotoxigenic E coli (ETEC) NOT DETECTED NOT DETECTED  Final   Shiga like toxin producing E coli (STEC) NOT DETECTED NOT DETECTED Final   Shigella/Enteroinvasive E coli (EIEC) NOT DETECTED NOT DETECTED Final   Cryptosporidium NOT DETECTED NOT DETECTED Final   Cyclospora cayetanensis NOT DETECTED NOT DETECTED Final   Entamoeba histolytica NOT DETECTED NOT DETECTED Final   Giardia lamblia NOT DETECTED NOT DETECTED Final   Adenovirus F40/41 NOT DETECTED NOT DETECTED Final   Astrovirus NOT DETECTED NOT DETECTED Final   Norovirus GI/GII NOT DETECTED NOT DETECTED Final   Rotavirus Trellis Guirguis NOT DETECTED NOT DETECTED Final   Sapovirus (I, II, IV, and V) NOT DETECTED NOT DETECTED Final    Comment: Performed at [facility], [address] [place], [place] [address]         Radiology Studies: DG Abd Acute W/Chest  Result Date: 01/24/2021 CLINICAL DATA:  Abdominal pain, vomiting.  History of obstruction EXAM: DG ABDOMEN ACUTE WITH 1 VIEW CHEST COMPARISON:  01/10/2021 FINDINGS: Markedly dilated small bowel loops with air-fluid level compatible with high-grade small bowel obstruction. Small bowel loops measure up to 8 cm in diameter. No free air organomegaly. Lungs clear. Heart is normal size. No effusions. No acute bony abnormality. IMPRESSION: Markedly dilated small bowel loops with air-fluid levels compatible with high-grade small bowel obstruction. Electronically Signed   By: Rolm Baptise M.D.   On: 01/24/2021 17:17   DG Abd Portable 1V-Small Bowel Obstruction  Protocol-initial, 8 hr delay  Result Date: 01/25/2021 CLINICAL DATA:  Small bowel obstruction. EXAM: PORTABLE ABDOMEN - 1 VIEW COMPARISON:  January 24, 2021. FINDINGS: Severe small bowel dilatation is again noted concerning for distal small bowel obstruction. Residual contrast is noted in the nondilated colon. IMPRESSION: Severe small bowel dilatation is again noted concerning for distal small bowel obstruction. Electronically Signed   By: Marijo Conception M.D.   On: 01/25/2021 08:22   DG Abd Portable 1V-Small Bowel Protocol-Position Verification  Result Date: 01/24/2021 CLINICAL DATA:  Enteric catheter placement EXAM: PORTABLE ABDOMEN - 1 VIEW COMPARISON:  01/24/2021 FINDINGS: Single frontal view of the lower chest and upper abdomen demonstrates enteric catheter passing below diaphragm tip and side port projecting over the gastric antrum. Marked gaseous distention of the small bowel with multiple gas fluid levels again noted unchanged. The lung bases are clear. IMPRESSION: 1. Enteric catheter tip and side port projecting over the gastric antrum. 2. Persistent small bowel obstruction. Electronically Signed   By: Randa Ngo M.D.   On: 01/24/2021 21:18   ECHOCARDIOGRAM LIMITED  Result Date: 01/25/2021    ECHOCARDIOGRAM LIMITED REPORT   Patient Name:   LENNA HAGARTY [facility] Date of Exam: 01/25/2021 Medical Rec #:  [id number]         Height:       63.0 in Accession #:    [id number]        Weight:       132.3 lb Date of Birth:  [birth year]        BSA:          1.622 m Patient Age:    82 years          BP:           140/88 mmHg Patient Gender: F                 HR:           86 bpm. Exam Location:  Inpatient Procedure: 2D Echo, Cardiac Doppler and Color Doppler Indications:    Edema  History:  Patient has no prior history of Echocardiogram examinations.                 Edema, Signs/Symptoms:Abdominal pain, Crohns; Risk                 Factors:Current Smoker and Hypertension.  Sonographer:    Dustin Flock  Referring Phys: [id number] Tarren Sabree CALDWELL POWELL JR  Sonographer Comments: COVID+ IMPRESSIONS  1. Left ventricular ejection fraction, by estimation, is 60 to 65%. The left ventricle has normal function. The left ventricle has no regional wall motion abnormalities. Left ventricular diastolic parameters are consistent with Grade I diastolic dysfunction (impaired relaxation).  2. Right ventricular systolic function is normal. The right ventricular size is normal.  3. The mitral valve is normal in structure. No evidence of mitral valve regurgitation. No evidence of mitral stenosis.  4. The aortic valve is normal in structure. Aortic valve regurgitation is not visualized. No aortic stenosis is present.  5. The inferior vena cava is normal in size with greater than 50% respiratory variability, suggesting right atrial pressure of 3 mmHg. FINDINGS  Left Ventricle: Left ventricular ejection fraction, by estimation, is 60 to 65%. The left ventricle has normal function. The left ventricle has no regional wall motion abnormalities. The left ventricular internal cavity size was normal in size. There is  no left ventricular hypertrophy. Left ventricular diastolic parameters are consistent with Grade I diastolic dysfunction (impaired relaxation). Normal left ventricular filling pressure. Right Ventricle: The right ventricular size is normal. No increase in right ventricular wall thickness. Right ventricular systolic function is normal. Left Atrium: Left atrial size was normal in size. Right Atrium: Right atrial size was normal in size. Pericardium: There is no evidence of pericardial effusion. Mitral Valve: The mitral valve is normal in structure. No evidence of mitral valve stenosis. Tricuspid Valve: The tricuspid valve is normal in structure. Tricuspid valve regurgitation is not demonstrated. No evidence of tricuspid stenosis. Aortic Valve: The aortic valve is normal in structure. Aortic valve regurgitation is not visualized. No aortic  stenosis is present. Pulmonic Valve: The pulmonic valve was normal in structure. Pulmonic valve regurgitation is not visualized. No evidence of pulmonic stenosis. Aorta: The aortic root is normal in size and structure. Venous: The inferior vena cava is normal in size with greater than 50% respiratory variability, suggesting right atrial pressure of 3 mmHg. IAS/Shunts: No atrial level shunt detected by color flow Doppler. LEFT VENTRICLE PLAX 2D LVIDd:         5.10 cm  Diastology LVIDs:         2.90 cm  LV e' medial:    7.51 cm/s LV PW:         1.00 cm  LV E/e' medial:  9.9 LV IVS:        1.00 cm  LV e' lateral:   11.10 cm/s LVOT diam:     2.00 cm  LV E/e' lateral: 6.7 LVOT Area:     3.14 cm  RIGHT VENTRICLE RV S prime:     14.70 cm/s LEFT ATRIUM         Index LA diam:    3.70 cm 2.28 cm/m   AORTA Ao Root diam: 2.50 cm MITRAL VALVE MV Area (PHT): 4.80 cm     SHUNTS MV Decel Time: 158 msec     Systemic Diam: 2.00 cm MV E velocity: 74.10 cm/s MV Irine Heminger velocity: 108.00 cm/s MV E/[address] ratio:  0.69 Mihai Croitoru MD Electronically signed by Sanda Klein MD Signature  Date/Time: 01/25/2021/4:23:40 PM    Final         Scheduled Meds: . enoxaparin (LOVENOX) injection  40 mg Subcutaneous Q24H  . feeding supplement  1 Container Oral TID BM  . lip balm  1 application Topical BID  . methylPREDNISolone (SOLU-MEDROL) injection  60 mg Intravenous Daily  . nicotine  7 mg Transdermal Daily  . pantoprazole (PROTONIX) IV  40 mg Intravenous Q24H  . phosphorus  500 mg Oral QID  . [START ON 01/27/2021] venlafaxine XR  300 mg Oral Q breakfast   Continuous Infusions: . lactated ringers 125 mL/hr at 01/26/21 1239  . methocarbamol (ROBAXIN) IV    . ondansetron (ZOFRAN) IV    . remdesivir 100 mg in NS 100 mL 100 mg (01/26/21 1030)     LOS: 2 days    Time spent: over 56 min    Fayrene Helper, MD [facility]   To contact the attending provider between 7A-7P or the covering provider during after hours 7P-7A,  please log into the web site [email or web address] and access using universal [place] password for that web site. If you do not have the password, please call the hospital operator.  01/26/2021, 4:35 PM

## 2021-01-26 NOTE — Plan of Care (Signed)
Patient c/o indigestion improved with Protonix and Maalox.  Tolerating clear liquids.

## 2021-01-26 NOTE — Progress Notes (Signed)
Patient ID: Cynthia [name], female   DOB: 1961/06/16, 60 y.o.   MRN: [id number] [facility] Progress Note:   * No surgery found *  Subjective: Mental status is clear.  Complaints frustrated with [employer] and being directed to [employer]. Objective: Vital signs in last 24 hours: Temp:  [98 F (36.7 C)-98.6 F (37 C)] 98 F (36.7 C) (03/26 0529) Pulse Rate:  [78-87] 78 (03/26 0529) Resp:  [16-18] 16 (03/26 0529) BP: (135-146)/(87-94) 145/93 (03/26 0529) SpO2:  [100 %] 100 % (03/26 0529)  Intake/Output from previous day: 03/25 0701 - 03/26 0700 In: 1203 [I.V.:1203] Out: -  Intake/Output this shift: Total I/O In: 480 [P.O.:480] Out: 300 [Urine:300]  Physical Exam: Work of breathing is not labored.  She is on COVID ward on respiratory isolation.  Nontender and passing flatus.    Lab Results:  Results for orders placed or performed during the hospital encounter of 01/24/21 (from the past 48 hour(s))  Magnesium     Status: None   Collection Time: 01/24/21  4:41 PM  Result Value Ref Range   Magnesium 1.9 1.7 - 2.4 mg/dL    Comment: Performed at [facility], [place] [address], [place], [place] [address]  Urinalysis, Routine w reflex microscopic Urine, Clean Catch     Status: Abnormal   Collection Time: 01/24/21  4:51 PM  Result Value Ref Range   Color, Urine YELLOW YELLOW   APPearance CLEAR CLEAR   Specific Gravity, Urine 1.026 1.005 - 1.030   pH 8.0 5.0 - 8.0   Glucose, UA NEGATIVE NEGATIVE mg/dL   Hgb urine dipstick NEGATIVE NEGATIVE   Bilirubin Urine NEGATIVE NEGATIVE   Ketones, ur 20 (A) NEGATIVE mg/dL   Protein, ur 100 (A) NEGATIVE mg/dL   Nitrite NEGATIVE NEGATIVE   Leukocytes,Ua NEGATIVE NEGATIVE   RBC / HPF 0-5 0 - 5 RBC/hpf   WBC, UA 0-5 0 - 5 WBC/hpf   Bacteria, UA NONE SEEN NONE SEEN   Squamous Epithelial / LPF 6-10 0 - 5   Mucus PRESENT     Comment: Performed at [facility], [place] [address]  [place], [place] [address]  Resp Panel by RT-PCR (Flu A&B, Covid) Nasopharyngeal Swab     Status: Abnormal   Collection Time: 01/24/21  5:34 PM   Specimen: Nasopharyngeal Swab; Nasopharyngeal(NP) swabs in vial transport medium  Result Value Ref Range   SARS Coronavirus 2 by RT PCR POSITIVE (A) NEGATIVE    Comment: RESULT CALLED TO, READ BACK BY AND VERIFIED WITH: RENITA, RN @ [phone] ON 01/25/21 C VARNER (NOTE) SARS-CoV-2 target nucleic acids are DETECTED.  The SARS-CoV-2 RNA is generally detectable in upper respiratory specimens during the acute phase of infection. Positive results are indicative of the presence of the identified virus, but do not rule out bacterial infection or co-infection with other pathogens not detected by the test. Clinical correlation with patient history and other diagnostic information is necessary to determine patient infection status. The expected result is Negative.  Fact Sheet for Patients: EntrepreneurPulse.com.au  Fact Sheet for Healthcare Providers: IncredibleEmployment.be  This test is not yet approved or cleared by the [place] FDA and  has been authorized for detection and/or diagnosis of SARS-CoV-2 by FDA under an Emergency Use Authorization (EUA).  This EUA will remain in effect (meaning this test ca n be used) for the duration of  the COVID-19 declaration under Section 564(b)(1) of the Act, 21 U.S.C. section 360bbb-3(b)(1), unless the authorization  is terminated or revoked sooner.     Influenza A by PCR NEGATIVE NEGATIVE   Influenza B by PCR NEGATIVE NEGATIVE    Comment: (NOTE) The Xpert Xpress SARS-CoV-2/FLU/RSV plus assay is intended as an aid in the diagnosis of influenza from Nasopharyngeal swab specimens and should not be used as a sole basis for treatment. Nasal washings and aspirates are unacceptable for Xpert Xpress SARS-CoV-2/FLU/RSV testing.  Fact Sheet for  Patients: EntrepreneurPulse.com.au  Fact Sheet for Healthcare Providers: IncredibleEmployment.be  This test is not yet approved or cleared by the [place] FDA and has been authorized for detection and/or diagnosis of SARS-CoV-2 by FDA under an Emergency Use Authorization (EUA). This EUA will remain in effect (meaning this test can be used) for the duration of the COVID-19 declaration under Section 564(b)(1) of the Act, 21 U.S.C. section 360bbb-3(b)(1), unless the authorization is terminated or revoked.  Performed at [facility], [place] [address] [place], [place] [address]   Comprehensive metabolic panel     Status: Abnormal   Collection Time: 01/24/21  7:35 PM  Result Value Ref Range   Sodium 142 135 - 145 mmol/L   Potassium 3.1 (L) 3.5 - 5.1 mmol/L   Chloride 106 98 - 111 mmol/L   CO2 26 22 - 32 mmol/L   Glucose, Bld 81 70 - 99 mg/dL    Comment: Glucose reference range applies only to samples taken after fasting for at least 8 hours.   BUN 19 8 - 23 mg/dL   Creatinine, Ser 0.66 0.44 - 1.00 mg/dL   Calcium 8.0 (L) 8.9 - 10.3 mg/dL   Total Protein 6.0 (L) 6.5 - 8.1 g/dL   Albumin 2.9 (L) 3.5 - 5.0 g/dL   AST 27 15 - 41 U/L   ALT 26 0 - 44 U/L   Alkaline Phosphatase 68 38 - 126 U/L   Total Bilirubin 1.3 (H) 0.3 - 1.2 mg/dL   GFR, Estimated >60 >60 mL/min    Comment: (NOTE) Calculated using the CKD-EPI Creatinine Equation (2021)    Anion gap 10 5 - 15    Comment: Performed at [facility], [place] [address], [place], [place] [address]  Lipase, blood     Status: None   Collection Time: 01/24/21  7:35 PM  Result Value Ref Range   Lipase 19 11 - 51 U/L    Comment: Performed at [facility], [place] [address] [place], [place] [address]  CBC with Diff     Status: Abnormal   Collection Time: 01/24/21  7:35 PM  Result Value Ref Range   WBC 15.1 (H) 4.0 - 10.5 K/uL   RBC 4.06 3.87 - 5.11  MIL/uL   Hemoglobin 12.3 12.0 - 15.0 g/dL   HCT 37.0 36.0 - 46.0 %   MCV 91.1 80.0 - 100.0 fL   MCH 30.3 26.0 - 34.0 pg   MCHC 33.2 30.0 - 36.0 g/dL   RDW 15.4 11.5 - 15.5 %   Platelets 336 150 - 400 K/uL   nRBC 0.0 0.0 - 0.2 %   Neutrophils Relative % 78 %   Neutro Abs 11.8 (H) 1.7 - 7.7 K/uL   Lymphocytes Relative 16 %   Lymphs Abs 2.3 0.7 - 4.0 K/uL   Monocytes Relative 6 %   Monocytes Absolute 0.9 0.1 - 1.0 K/uL   Eosinophils Relative 0 %   Eosinophils Absolute 0.0 0.0 - 0.5 K/uL   Basophils Relative 0 %   Basophils Absolute  0.0 0.0 - 0.1 K/uL   Immature Granulocytes 0 %   Abs Immature Granulocytes 0.06 0.00 - 0.07 K/uL    Comment: Performed at [facility], [place] [address] [place], [place] [address]  Basic metabolic panel     Status: Abnormal   Collection Time: 01/25/21  5:43 AM  Result Value Ref Range   Sodium 143 135 - 145 mmol/L   Potassium 3.1 (L) 3.5 - 5.1 mmol/L   Chloride 109 98 - 111 mmol/L   CO2 23 22 - 32 mmol/L   Glucose, Bld 103 (H) 70 - 99 mg/dL    Comment: Glucose reference range applies only to samples taken after fasting for at least 8 hours.   BUN 17 8 - 23 mg/dL   Creatinine, Ser 0.83 0.44 - 1.00 mg/dL   Calcium 8.1 (L) 8.9 - 10.3 mg/dL   GFR, Estimated >60 >60 mL/min    Comment: (NOTE) Calculated using the CKD-EPI Creatinine Equation (2021)    Anion gap 11 5 - 15    Comment: Performed at [facility], [place] [address], [place], [place] [address]  CBC     Status: Abnormal   Collection Time: 01/25/21  5:43 AM  Result Value Ref Range   WBC 11.3 (H) 4.0 - 10.5 K/uL   RBC 4.08 3.87 - 5.11 MIL/uL   Hemoglobin 12.2 12.0 - 15.0 g/dL   HCT 38.3 36.0 - 46.0 %   MCV 93.9 80.0 - 100.0 fL   MCH 29.9 26.0 - 34.0 pg   MCHC 31.9 30.0 - 36.0 g/dL   RDW 15.7 (H) 11.5 - 15.5 %   Platelets 322 150 - 400 K/uL   nRBC 0.0 0.0 - 0.2 %    Comment: Performed at [facility], [place] [address], [place], [place]  [address]  Prealbumin     Status: Abnormal   Collection Time: 01/25/21  5:43 AM  Result Value Ref Range   Prealbumin 9.7 (L) 18 - 38 mg/dL    Comment: Performed at [facility], [place] [address], [place], [place] [address]  Phosphorus     Status: Abnormal   Collection Time: 01/25/21  5:43 AM  Result Value Ref Range   Phosphorus 1.9 (L) 2.5 - 4.6 mg/dL    Comment: Performed at [facility], [place] [address], [place], [place] [address]  Magnesium     Status: None   Collection Time: 01/25/21  5:43 AM  Result Value Ref Range   Magnesium 1.8 1.7 - 2.4 mg/dL    Comment: Performed at [facility], [place] [address] [place], [place] [address]  C Difficile Quick Screen w PCR reflex     Status: None   Collection Time: 01/25/21 12:13 PM   Specimen: STOOL  Result Value Ref Range   C Diff antigen NEGATIVE NEGATIVE   C Diff toxin NEGATIVE NEGATIVE   C Diff interpretation No C. difficile detected.     Comment: Performed at [facility], [place] [address] [place], [place] [address]  Protein / creatinine ratio, urine     Status: Abnormal   Collection Time: 01/25/21  6:56 PM  Result Value Ref Range   Creatinine, Urine 198.93 mg/dL   Total Protein, Urine 51 mg/dL    Comment: NO NORMAL RANGE ESTABLISHED FOR THIS TEST   Protein Creatinine Ratio 0.26 (H) 0.00 - 0.15 mg/mg[Cre]    Comment: Performed at [facility], [place] [address]  [place], [place] [address]  CBC with Differential/Platelet     Status: Abnormal   Collection Time: 01/26/21  5:35 AM  Result Value Ref Range   WBC 8.1 4.0 - 10.5 K/uL   RBC 3.49 (L) 3.87 - 5.11 MIL/uL   Hemoglobin 10.4 (L) 12.0 - 15.0 g/dL   HCT 32.1 (L) 36.0 - 46.0 %   MCV 92.0 80.0 - 100.0 fL   MCH 29.8 26.0 - 34.0 pg   MCHC 32.4 30.0 - 36.0 g/dL   RDW 15.7 (H) 11.5 - 15.5 %   Platelets 246 150 - 400 K/uL   nRBC 0.0 0.0 - 0.2 %   Neutrophils Relative % 63 %   Neutro Abs 5.1 1.7 -  7.7 K/uL   Lymphocytes Relative 29 %   Lymphs Abs 2.4 0.7 - 4.0 K/uL   Monocytes Relative 6 %   Monocytes Absolute 0.5 0.1 - 1.0 K/uL   Eosinophils Relative 2 %   Eosinophils Absolute 0.1 0.0 - 0.5 K/uL   Basophils Relative 0 %   Basophils Absolute 0.0 0.0 - 0.1 K/uL   Immature Granulocytes 0 %   Abs Immature Granulocytes 0.03 0.00 - 0.07 K/uL    Comment: Performed at [facility], [place] [address] [place], [place] [address]  Comprehensive metabolic panel     Status: Abnormal   Collection Time: 01/26/21  5:35 AM  Result Value Ref Range   Sodium 137 135 - 145 mmol/L   Potassium 3.0 (L) 3.5 - 5.1 mmol/L   Chloride 109 98 - 111 mmol/L   CO2 23 22 - 32 mmol/L   Glucose, Bld 76 70 - 99 mg/dL    Comment: Glucose reference range applies only to samples taken after fasting for at least 8 hours.   BUN 10 8 - 23 mg/dL   Creatinine, Ser 0.54 0.44 - 1.00 mg/dL   Calcium 7.8 (L) 8.9 - 10.3 mg/dL   Total Protein 4.5 (L) 6.5 - 8.1 g/dL   Albumin 2.2 (L) 3.5 - 5.0 g/dL   AST 14 (L) 15 - 41 U/L   ALT 18 0 - 44 U/L   Alkaline Phosphatase 50 38 - 126 U/L   Total Bilirubin 0.7 0.3 - 1.2 mg/dL   GFR, Estimated >60 >60 mL/min    Comment: (NOTE) Calculated using the CKD-EPI Creatinine Equation (2021)    Anion gap 5 5 - 15    Comment: Performed at [facility], [place] [address], [place], [place] [address]  Magnesium     Status: Abnormal   Collection Time: 01/26/21  5:35 AM  Result Value Ref Range   Magnesium 1.5 (L) 1.7 - 2.4 mg/dL    Comment: Performed at [facility], [place] [address], [place], [place] [address]  Phosphorus     Status: Abnormal   Collection Time: 01/26/21  5:35 AM  Result Value Ref Range   Phosphorus 1.4 (L) 2.5 - 4.6 mg/dL    Comment: Performed at [facility], [place] [address] [place], [place] [address]    Radiology/Results: DG Abd Acute W/Chest  Result Date: 01/24/2021 CLINICAL DATA:  Abdominal  pain, vomiting.  History of obstruction EXAM: DG ABDOMEN ACUTE WITH 1 VIEW CHEST COMPARISON:  01/10/2021 FINDINGS: Markedly dilated small bowel loops with air-fluid level compatible with high-grade small bowel obstruction. Small bowel loops measure up to 8 cm in diameter. No free air organomegaly. Lungs clear. Heart is normal size. No effusions. No acute bony abnormality. IMPRESSION: Markedly  dilated small bowel loops with air-fluid levels compatible with high-grade small bowel obstruction. Electronically Signed   By: Rolm Baptise M.D.   On: 01/24/2021 17:17   DG Abd Portable 1V-Small Bowel Obstruction Protocol-initial, 8 hr delay  Result Date: 01/25/2021 CLINICAL DATA:  Small bowel obstruction. EXAM: PORTABLE ABDOMEN - 1 VIEW COMPARISON:  January 24, 2021. FINDINGS: Severe small bowel dilatation is again noted concerning for distal small bowel obstruction. Residual contrast is noted in the nondilated colon. IMPRESSION: Severe small bowel dilatation is again noted concerning for distal small bowel obstruction. Electronically Signed   By: Marijo Conception M.D.   On: 01/25/2021 08:22   DG Abd Portable 1V-Small Bowel Protocol-Position Verification  Result Date: 01/24/2021 CLINICAL DATA:  Enteric catheter placement EXAM: PORTABLE ABDOMEN - 1 VIEW COMPARISON:  01/24/2021 FINDINGS: Single frontal view of the lower chest and upper abdomen demonstrates enteric catheter passing below diaphragm tip and side port projecting over the gastric antrum. Marked gaseous distention of the small bowel with multiple gas fluid levels again noted unchanged. The lung bases are clear. IMPRESSION: 1. Enteric catheter tip and side port projecting over the gastric antrum. 2. Persistent small bowel obstruction. Electronically Signed   By: Randa Ngo M.D.   On: 01/24/2021 21:18   ECHOCARDIOGRAM LIMITED  Result Date: 01/25/2021    ECHOCARDIOGRAM LIMITED REPORT   Patient Name:   Cynthia [name] [facility] Date of Exam: 01/25/2021 Medical Rec #:   [id number]         Height:       63.0 in Accession #:    [id number]        Weight:       132.3 lb Date of Birth:  09/21/1961        BSA:          1.622 m Patient Age:    56 years          BP:           140/88 mmHg Patient Gender: F                 HR:           86 bpm. Exam Location:  Inpatient Procedure: 2D Echo, Cardiac Doppler and Color Doppler Indications:    Edema  History:        Patient has no prior history of Echocardiogram examinations.                 Edema, Signs/Symptoms:Abdominal pain, Crohns; Risk                 Factors:Current Smoker and Hypertension.  Sonographer:    Dustin Flock Referring Phys: [phone] A CALDWELL POWELL JR  Sonographer Comments: COVID+ IMPRESSIONS  1. Left ventricular ejection fraction, by estimation, is 60 to 65%. The left ventricle has normal function. The left ventricle has no regional wall motion abnormalities. Left ventricular diastolic parameters are consistent with Grade I diastolic dysfunction (impaired relaxation).  2. Right ventricular systolic function is normal. The right ventricular size is normal.  3. The mitral valve is normal in structure. No evidence of mitral valve regurgitation. No evidence of mitral stenosis.  4. The aortic valve is normal in structure. Aortic valve regurgitation is not visualized. No aortic stenosis is present.  5. The inferior vena cava is normal in size with greater than 50% respiratory variability, suggesting right atrial pressure of 3 mmHg. FINDINGS  Left Ventricle: Left ventricular ejection fraction, by estimation, is 60 to 65%. The  left ventricle has normal function. The left ventricle has no regional wall motion abnormalities. The left ventricular internal cavity size was normal in size. There is  no left ventricular hypertrophy. Left ventricular diastolic parameters are consistent with Grade I diastolic dysfunction (impaired relaxation). Normal left ventricular filling pressure. Right Ventricle: The right ventricular size is  normal. No increase in right ventricular wall thickness. Right ventricular systolic function is normal. Left Atrium: Left atrial size was normal in size. Right Atrium: Right atrial size was normal in size. Pericardium: There is no evidence of pericardial effusion. Mitral Valve: The mitral valve is normal in structure. No evidence of mitral valve stenosis. Tricuspid Valve: The tricuspid valve is normal in structure. Tricuspid valve regurgitation is not demonstrated. No evidence of tricuspid stenosis. Aortic Valve: The aortic valve is normal in structure. Aortic valve regurgitation is not visualized. No aortic stenosis is present. Pulmonic Valve: The pulmonic valve was normal in structure. Pulmonic valve regurgitation is not visualized. No evidence of pulmonic stenosis. Aorta: The aortic root is normal in size and structure. Venous: The inferior vena cava is normal in size with greater than 50% respiratory variability, suggesting right atrial pressure of 3 mmHg. IAS/Shunts: No atrial level shunt detected by color flow Doppler. LEFT VENTRICLE PLAX 2D LVIDd:         5.10 cm  Diastology LVIDs:         2.90 cm  LV e' medial:    7.51 cm/s LV PW:         1.00 cm  LV E/e' medial:  9.9 LV IVS:        1.00 cm  LV e' lateral:   11.10 cm/s LVOT diam:     2.00 cm  LV E/e' lateral: 6.7 LVOT Area:     3.14 cm  RIGHT VENTRICLE RV S prime:     14.70 cm/s LEFT ATRIUM         Index LA diam:    3.70 cm 2.28 cm/m   AORTA Ao Root diam: 2.50 cm MITRAL VALVE MV Area (PHT): 4.80 cm     SHUNTS MV Decel Time: 158 msec     Systemic Diam: 2.00 cm MV E velocity: 74.10 cm/s MV A velocity: 108.00 cm/s MV E/A ratio:  0.69 Mihai Croitoru MD Electronically signed by Sanda Klein MD Signature Date/Time: 01/25/2021/4:23:40 PM    Final     Anti-infectives: Anti-infectives (From admission, onward)   Start     Dose/Rate Route Frequency Ordered Stop   01/26/21 1000  remdesivir 100 mg in sodium chloride 0.9 % 100 mL IVPB       "Followed by" Linked  Group Details   100 mg 200 mL/hr over 30 Minutes Intravenous Daily 01/25/21 1034 01/28/21 0959   01/25/21 1200  remdesivir 200 mg in sodium chloride 0.9% 250 mL IVPB       "Followed by" Linked Group Details   200 mg 580 mL/hr over 30 Minutes Intravenous Once 01/25/21 1034 01/25/21 1555      Assessment/Plan: Problem List: Patient Active Problem List   Diagnosis Date Noted  . COVID-19 01/25/2021  . Leukocytosis 01/24/2021  . Crohn's colitis, with intestinal obstruction ([place]) 01/24/2021  . 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 History of rectal polyp 2020 01/12/2021  . Anxiety associated with depression 01/12/2021  . Hypokalemia 12/04/2020  . Cough 11/27/2020  . Otalgia of both ears 11/27/2020  . Body aches 11/27/2020  . Fever 11/27/2020  . High risk medication  use 11/27/2020  . 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[place]) 06/27/2020  . 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 Crohn's ileitis, with intestinal obstruction ([place]) 10/05/2019  . Colitis   . Rectal polyp   . 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  . RLS (restless legs syndrome) 09/25/2014  . Current smoker 09/19/2011  . Migraine headache 09/19/2011  . Essential hypertension 09/19/2011  . Obesity (BMI 30-39.9) 09/19/2011  . History of serrated colonic polyp 01/13/2011  . Gastroesophageal reflux disease 03/17/2008  . MVP (mitral valve prolapse) 03/17/2008  . HIATAL HERNIA 05/03/2002    Observation to assess efficacy of biologics against her Crohn's disease * No surgery found *    LOS: 2 days   Matt B. Hassell Done, MD, [facility] [phone] to reach the surgeon on call.    01/26/2021 11:57 AM

## 2021-01-26 NOTE — Progress Notes (Signed)
[facility] Subjective: Patient is a 60 year old white female with severe Crohn ileitis and obstructive symptoms now hospitalized for the third time with bowel obstruction in spite of being on Stelara and steroids. She complains of several loose stools this morning with some worsening of her reflux. She received some Maalox and PPIs this morning. Her nausea and vomiting has improved significantly with IV Solu-Medrol and though symptoms are's better since she was hospitalized. She has several questions about how her insurance is going to work out with regards to possible surgery for stricture causing most of her symptoms. Her sister is in the room with her.   Objective: Vital signs in last 24 hours: Temp:  [98 F (36.7 C)-98.6 F (37 C)] 98 F (36.7 C) (03/26 0529) Pulse Rate:  [78-91] 78 (03/26 0529) Resp:  [16-18] 16 (03/26 0529) BP: (135-146)/(79-94) 145/93 (03/26 0529) SpO2:  [100 %] 100 % (03/26 0529) Last BM Date: 01/25/21  Intake/Output from previous day: 03/25 0701 - 03/26 0700 In: 1203 [I.V.:1203] Out: -  Intake/Output this shift: Total I/O In: -  Out: 300 [Urine:300]  Resp: clear to auscultation bilaterally Cardio: regular rate and rhythm, S1, S2 normal, no murmur, click, rub or gallop GI: soft, non-tender; bowel sounds normal; no masses,  no organomegaly Extremities: extremities normal, atraumatic, no cyanosis or edema  Lab Results: Recent Labs    01/24/21 1935 01/25/21 0543 01/26/21 0535  WBC 15.1* 11.3* 8.1  HGB 12.3 12.2 10.4*  HCT 37.0 38.3 32.1*  PLT 336 322 246   BMET Recent Labs    01/24/21 1935 01/25/21 0543 01/26/21 0535  NA 142 143 137  K 3.1* 3.1* 3.0*  CL 106 109 109  CO2 26 23 23   GLUCOSE 81 103* 76  BUN 19 17 10   CREATININE 0.66 0.83 0.54  CALCIUM 8.0* 8.1* 7.8*   LFT Recent Labs    01/26/21 0535  PROT 4.5*  ALBUMIN 2.2*  AST 14*  ALT 18  ALKPHOS 50  BILITOT 0.7   C-Diff Recent Labs    01/25/21 1213  CDIFFTOX  NEGATIVE   Studies/Results: DG Abd Acute W/Chest  Result Date: 01/24/2021 CLINICAL DATA:  Abdominal pain, vomiting.  History of obstruction EXAM: DG ABDOMEN ACUTE WITH 1 VIEW CHEST COMPARISON:  01/10/2021 FINDINGS: Markedly dilated small bowel loops with air-fluid level compatible with high-grade small bowel obstruction. Small bowel loops measure up to 8 cm in diameter. No free air organomegaly. Lungs clear. Heart is normal size. No effusions. No acute bony abnormality. IMPRESSION: Markedly dilated small bowel loops with air-fluid levels compatible with high-grade small bowel obstruction. Electronically Signed   By: Rolm Baptise M.D.   On: 01/24/2021 17:17   DG Abd Portable 1V-Small Bowel Obstruction Protocol-initial, 8 hr delay  Result Date: 01/25/2021 CLINICAL DATA:  Small bowel obstruction. EXAM: PORTABLE ABDOMEN - 1 VIEW COMPARISON:  January 24, 2021. FINDINGS: Severe small bowel dilatation is again noted concerning for distal small bowel obstruction. Residual contrast is noted in the nondilated colon. IMPRESSION: Severe small bowel dilatation is again noted concerning for distal small bowel obstruction. Electronically Signed   By: Marijo Conception M.D.   On: 01/25/2021 08:22   DG Abd Portable 1V-Small Bowel Protocol-Position Verification  Result Date: 01/24/2021 CLINICAL DATA:  Enteric catheter placement EXAM: PORTABLE ABDOMEN - 1 VIEW COMPARISON:  01/24/2021 FINDINGS: Single frontal view of the lower chest and upper abdomen demonstrates enteric catheter passing below diaphragm tip and side port projecting over the gastric antrum. Marked gaseous  distention of the small bowel with multiple gas fluid levels again noted unchanged. The lung bases are clear. IMPRESSION: 1. Enteric catheter tip and side port projecting over the gastric antrum. 2. Persistent small bowel obstruction. Electronically Signed   By: Randa Ngo M.D.   On: 01/24/2021 21:18   ECHOCARDIOGRAM LIMITED  Result Date: 01/25/2021     ECHOCARDIOGRAM LIMITED REPORT   Patient Name:   Cynthia [name] [facility] Date of Exam: 01/25/2021 Medical Rec #:  [id number]         Height:       63.0 in Accession #:    [id number]        Weight:       132.3 lb Date of Birth:  07-26-1961        BSA:          1.622 m Patient Age:    28 years          BP:           140/88 mmHg Patient Gender: F                 HR:           86 bpm. Exam Location:  Inpatient Procedure: 2D Echo, Cardiac Doppler and Color Doppler Indications:    Edema  History:        Patient has no prior history of Echocardiogram examinations.                 Edema, Signs/Symptoms:Abdominal pain, Crohns; Risk                 Factors:Current Smoker and Hypertension.  Sonographer:    Dustin Flock Referring Phys: [phone] A CALDWELL POWELL JR  Sonographer Comments: COVID+ IMPRESSIONS  1. Left ventricular ejection fraction, by estimation, is 60 to 65%. The left ventricle has normal function. The left ventricle has no regional wall motion abnormalities. Left ventricular diastolic parameters are consistent with Grade I diastolic dysfunction (impaired relaxation).  2. Right ventricular systolic function is normal. The right ventricular size is normal.  3. The mitral valve is normal in structure. No evidence of mitral valve regurgitation. No evidence of mitral stenosis.  4. The aortic valve is normal in structure. Aortic valve regurgitation is not visualized. No aortic stenosis is present.  5. The inferior vena cava is normal in size with greater than 50% respiratory variability, suggesting right atrial pressure of 3 mmHg. FINDINGS  Left Ventricle: Left ventricular ejection fraction, by estimation, is 60 to 65%. The left ventricle has normal function. The left ventricle has no regional wall motion abnormalities. The left ventricular internal cavity size was normal in size. There is  no left ventricular hypertrophy. Left ventricular diastolic parameters are consistent with Grade I diastolic dysfunction (impaired  relaxation). Normal left ventricular filling pressure. Right Ventricle: The right ventricular size is normal. No increase in right ventricular wall thickness. Right ventricular systolic function is normal. Left Atrium: Left atrial size was normal in size. Right Atrium: Right atrial size was normal in size. Pericardium: There is no evidence of pericardial effusion. Mitral Valve: The mitral valve is normal in structure. No evidence of mitral valve stenosis. Tricuspid Valve: The tricuspid valve is normal in structure. Tricuspid valve regurgitation is not demonstrated. No evidence of tricuspid stenosis. Aortic Valve: The aortic valve is normal in structure. Aortic valve regurgitation is not visualized. No aortic stenosis is present. Pulmonic Valve: The pulmonic valve was normal in structure. Pulmonic valve regurgitation is not visualized.  No evidence of pulmonic stenosis. Aorta: The aortic root is normal in size and structure. Venous: The inferior vena cava is normal in size with greater than 50% respiratory variability, suggesting right atrial pressure of 3 mmHg. IAS/Shunts: No atrial level shunt detected by color flow Doppler. LEFT VENTRICLE PLAX 2D LVIDd:         5.10 cm  Diastology LVIDs:         2.90 cm  LV e' medial:    7.51 cm/s LV PW:         1.00 cm  LV E/e' medial:  9.9 LV IVS:        1.00 cm  LV e' lateral:   11.10 cm/s LVOT diam:     2.00 cm  LV E/e' lateral: 6.7 LVOT Area:     3.14 cm  RIGHT VENTRICLE RV S prime:     14.70 cm/s LEFT ATRIUM         Index LA diam:    3.70 cm 2.28 cm/m   AORTA Ao Root diam: 2.50 cm MITRAL VALVE MV Area (PHT): 4.80 cm     SHUNTS MV Decel Time: 158 msec     Systemic Diam: 2.00 cm MV E velocity: 74.10 cm/s MV A velocity: 108.00 cm/s MV E/A ratio:  0.69 Mihai Croitoru MD Electronically signed by Sanda Klein MD Signature Date/Time: 01/25/2021/4:23:40 PM    Final    Medications: I have reviewed the patient's current medications.  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 Patient continues to have diarrhea stool studies are pending. Patient is trying to figure out where she can have her surgery because of insurance limitations.  2) GERD. 3) Covid positive-received Remdesivir. 4) History of adenomatous polyps.   LOS: 2 days   Juanita Craver 01/26/2021, 8:53 AM

## 2021-01-27 LAB — CBC WITH DIFFERENTIAL/PLATELET
Abs Immature Granulocytes: 0.07 10*3/uL (ref 0.00–0.07)
Basophils Absolute: 0 10*3/uL (ref 0.0–0.1)
Basophils Relative: 0 %
Eosinophils Absolute: 0.1 10*3/uL (ref 0.0–0.5)
Eosinophils Relative: 1 %
HCT: 36.2 % (ref 36.0–46.0)
Hemoglobin: 11.4 g/dL — ABNORMAL LOW (ref 12.0–15.0)
Immature Granulocytes: 1 %
Lymphocytes Relative: 38 %
Lymphs Abs: 3 10*3/uL (ref 0.7–4.0)
MCH: 29.5 pg (ref 26.0–34.0)
MCHC: 31.5 g/dL (ref 30.0–36.0)
MCV: 93.5 fL (ref 80.0–100.0)
Monocytes Absolute: 0.6 10*3/uL (ref 0.1–1.0)
Monocytes Relative: 8 %
Neutro Abs: 4.1 10*3/uL (ref 1.7–7.7)
Neutrophils Relative %: 52 %
Platelets: 283 10*3/uL (ref 150–400)
RBC: 3.87 MIL/uL (ref 3.87–5.11)
RDW: 15.3 % (ref 11.5–15.5)
WBC: 7.8 10*3/uL (ref 4.0–10.5)
nRBC: 0 % (ref 0.0–0.2)

## 2021-01-27 LAB — COMPREHENSIVE METABOLIC PANEL
ALT: 18 U/L (ref 0–44)
AST: 16 U/L (ref 15–41)
Albumin: 2.5 g/dL — ABNORMAL LOW (ref 3.5–5.0)
Alkaline Phosphatase: 59 U/L (ref 38–126)
Anion gap: 7 (ref 5–15)
BUN: 8 mg/dL (ref 8–23)
CO2: 24 mmol/L (ref 22–32)
Calcium: 8.1 mg/dL — ABNORMAL LOW (ref 8.9–10.3)
Chloride: 108 mmol/L (ref 98–111)
Creatinine, Ser: 0.62 mg/dL (ref 0.44–1.00)
GFR, Estimated: >60 mL/min (ref >60)
Glucose, Bld: 59 mg/dL — ABNORMAL LOW (ref 70–99)
Potassium: 4.3 mmol/L (ref 3.5–5.1)
Sodium: 139 mmol/L (ref 135–145)
Total Bilirubin: 0.7 mg/dL (ref 0.3–1.2)
Total Protein: 5.1 g/dL — ABNORMAL LOW (ref 6.5–8.1)

## 2021-01-27 LAB — PHOSPHORUS: Phosphorus: 2.6 mg/dL (ref 2.5–4.6)

## 2021-01-27 LAB — GLUCOSE, RANDOM: Glucose, Bld: 151 mg/dL — ABNORMAL HIGH (ref 70–99)

## 2021-01-27 LAB — MAGNESIUM: Magnesium: 1.8 mg/dL (ref 1.7–2.4)

## 2021-01-27 NOTE — Progress Notes (Signed)
[facility] Subjective: Since I last evaluated the patient, her diarrhea seems to have resolved she has 1 bowel movement this morning at about 7 AM and none since 12 PM yesterday.  She denies having any abdominal pain or nausea. She continues to complain of reflux and received has received some Maalox this morning.  Is awaiting further details on the possible surgery for her stricture due to Crohn's disease  Objective: Vital signs in last 24 hours: Temp:  [98 F (36.7 C)] 98 F (36.7 C) (03/26 0529) Pulse Rate:  [78-86] 86 (03/26 2027) Resp:  [15-16] 15 (03/26 2027) BP: (145-146)/(93-102) 146/102 (03/26 2027) SpO2:  [99 %-100 %] 99 % (03/26 2027) Last BM Date: 01/26/21  Intake/Output from previous day: 03/26 0701 - 03/27 0700 In: 2283.4 [P.O.:480; I.V.:1703.4; IV Piggyback:100] Out: 300 [Urine:300] Intake/Output this shift: No intake/output data recorded.  GPE not done today.  Lab Results: Recent Labs    01/24/21 1935 01/25/21 0543 01/26/21 0535  WBC 15.1* 11.3* 8.1  HGB 12.3 12.2 10.4*  HCT 37.0 38.3 32.1*  PLT 336 322 246   BMET Recent Labs    01/24/21 1935 01/25/21 0543 01/26/21 0535  NA 142 143 137  K 3.1* 3.1* 3.0*  CL 106 109 109  CO2 26 23 23   GLUCOSE 81 103* 76  BUN 19 17 10   CREATININE 0.66 0.83 0.54  CALCIUM 8.0* 8.1* 7.8*   LFT Recent Labs    01/26/21 0535  PROT 4.5*  ALBUMIN 2.2*  AST 14*  ALT 18  ALKPHOS 50  BILITOT 0.7    Recent Labs    01/25/21 1213  CDIFFTOX NEGATIVE   Studies/Results: DG Abd Portable 1V-Small Bowel Obstruction Protocol-initial, 8 hr delay  Result Date: 01/25/2021 CLINICAL DATA:  Small bowel obstruction. EXAM: PORTABLE ABDOMEN - 1 VIEW COMPARISON:  January 24, 2021. FINDINGS: Severe small bowel dilatation is again noted concerning for distal small bowel obstruction. Residual contrast is noted in the nondilated colon. IMPRESSION: Severe small bowel dilatation is again noted concerning for distal small bowel  obstruction. Electronically Signed   By: Marijo Conception M.D.   On: 01/25/2021 08:22   ECHOCARDIOGRAM LIMITED  Result Date: 01/25/2021    ECHOCARDIOGRAM LIMITED REPORT   Patient Name:   Cynthia [name] [facility] Date of Exam: 01/25/2021 Medical Rec #:  [id number]         Height:       63.0 in Accession #:    [id number]        Weight:       132.3 lb Date of Birth:  Jan 03, 1961        BSA:          1.622 m Patient Age:    60 years          BP:           140/88 mmHg Patient Gender: F                 HR:           86 bpm. Exam Location:  Inpatient Procedure: 2D Echo, Cardiac Doppler and Color Doppler Indications:    Edema  History:        Patient has no prior history of Echocardiogram examinations.                 Edema, Signs/Symptoms:Abdominal pain, Crohns; Risk                 Factors:Current Smoker and Hypertension.  Sonographer:    Dustin Flock Referring Phys: [phone] A CALDWELL POWELL JR  Sonographer Comments: COVID+ IMPRESSIONS  1. Left ventricular ejection fraction, by estimation, is 60 to 65%. The left ventricle has normal function. The left ventricle has no regional wall motion abnormalities. Left ventricular diastolic parameters are consistent with Grade I diastolic dysfunction (impaired relaxation).  2. Right ventricular systolic function is normal. The right ventricular size is normal.  3. The mitral valve is normal in structure. No evidence of mitral valve regurgitation. No evidence of mitral stenosis.  4. The aortic valve is normal in structure. Aortic valve regurgitation is not visualized. No aortic stenosis is present.  5. The inferior vena cava is normal in size with greater than 50% respiratory variability, suggesting right atrial pressure of 3 mmHg. FINDINGS  Left Ventricle: Left ventricular ejection fraction, by estimation, is 60 to 65%. The left ventricle has normal function. The left ventricle has no regional wall motion abnormalities. The left ventricular internal cavity size was normal in size.  There is  no left ventricular hypertrophy. Left ventricular diastolic parameters are consistent with Grade I diastolic dysfunction (impaired relaxation). Normal left ventricular filling pressure. Right Ventricle: The right ventricular size is normal. No increase in right ventricular wall thickness. Right ventricular systolic function is normal. Left Atrium: Left atrial size was normal in size. Right Atrium: Right atrial size was normal in size. Pericardium: There is no evidence of pericardial effusion. Mitral Valve: The mitral valve is normal in structure. No evidence of mitral valve stenosis. Tricuspid Valve: The tricuspid valve is normal in structure. Tricuspid valve regurgitation is not demonstrated. No evidence of tricuspid stenosis. Aortic Valve: The aortic valve is normal in structure. Aortic valve regurgitation is not visualized. No aortic stenosis is present. Pulmonic Valve: The pulmonic valve was normal in structure. Pulmonic valve regurgitation is not visualized. No evidence of pulmonic stenosis. Aorta: The aortic root is normal in size and structure. Venous: The inferior vena cava is normal in size with greater than 50% respiratory variability, suggesting right atrial pressure of 3 mmHg. IAS/Shunts: No atrial level shunt detected by color flow Doppler. LEFT VENTRICLE PLAX 2D LVIDd:         5.10 cm  Diastology LVIDs:         2.90 cm  LV e' medial:    7.51 cm/s LV PW:         1.00 cm  LV E/e' medial:  9.9 LV IVS:        1.00 cm  LV e' lateral:   11.10 cm/s LVOT diam:     2.00 cm  LV E/e' lateral: 6.7 LVOT Area:     3.14 cm  RIGHT VENTRICLE RV S prime:     14.70 cm/s LEFT ATRIUM         Index LA diam:    3.70 cm 2.28 cm/m   AORTA Ao Root diam: 2.50 cm MITRAL VALVE MV Area (PHT): 4.80 cm     SHUNTS MV Decel Time: 158 msec     Systemic Diam: 2.00 cm MV E velocity: 74.10 cm/s MV A velocity: 108.00 cm/s MV E/A ratio:  0.69 Mihai Croitoru MD Electronically signed by Sanda Klein MD Signature Date/Time:  01/25/2021/4:23:40 PM    Final    Medications: I have reviewed the patient's current medications.  Assessment/Plan: ) Crohn's ileitis with recurrent obstructive symptoms due to ileal stricture-currently improved on IV Solu-Medrol. Patient continues to have diarrhea stool studies are pending. Patient is trying to figure out  where she can have her surgery because of insurance limitations.  2) GERD. 3) Covid positive-received Remdesivir. 4) History of adenomatous polyps.     LOS: 3 days   Juanita Craver 01/27/2021, 12:40 AM

## 2021-01-27 NOTE — Progress Notes (Addendum)
PROGRESS NOTE    Cynthia [name]  CBS:[id number] DOB: Oct 06, 1961 DOA: 01/24/2021 PCP: Denita Lung, MD   Chief Complaint  Patient presents with  . Abdominal Pain  . Nausea  . Diarrhea   Brief Narrative:  Cynthia [name] is a 60 y.o. female with medical history significant for HTN, Crohn's disease. She was recently admitted to the hospital on March 8 and discharged on March 13 for Crohn's ileitis with intestinal obstruction. Patient states she had been doing well until yesterday. After eating she started developing nausea vomiting. Continued to have nausea and vomiting all day today and not able to tolerate anything to eat or drink. She reports having chronic diarrhea which is unchanged for her. She estimates vomiting maybe 20-25 times since yesterday. Patient is having waves of abdominal pain throughout her abdomen. Abdominal pain is severe and rated as 10/10 at worst. She does not know of aggravating factors.She now feels like she is getting more bloated and her abdomen is distended. Patient is followed by [facility] gastroenterology. She was referred to a colorectal surgeon at [facility] due to insurance reasons. She was scheduled to have her appointment tomorrow.  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  Per GI, she'd benefit from surgical resection, though question is timing of this with COVID infection.  Pt/family looking into insurance issues? 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  Smoking cessation education to be provided before discharge  Hypoglycemia Repeat random, asymptomatic, follow  DVT prophylaxis: lovenox Code Status: full  Family Communication: sister at bedside Disposition:   Status is: Inpatient  Remains inpatient appropriate because:Inpatient level of care appropriate due to severity of illness   Dispo: The patient is from: Home              Anticipated d/c is to: Home              Patient currently is not medically stable to d/c.   Difficult to place patient No       Consultants:   GI  Surgery  Procedures:  Echo IMPRESSIONS    1. Left ventricular ejection fraction, by estimation, is 60 to 65%. The  left ventricle has normal function. The left ventricle has no regional  wall motion abnormalities. Left ventricular diastolic parameters are  consistent with Grade I diastolic  dysfunction (impaired relaxation).  2.  Right ventricular systolic function is normal. The right ventricular  size is normal.  3. The mitral valve is normal in structure. No evidence of mitral valve  regurgitation. No evidence of mitral stenosis.  4. The aortic valve is normal in structure. Aortic valve regurgitation is  not visualized. No aortic  stenosis is present.  5. The inferior vena cava is normal in size with greater than 50%  respiratory variability, suggesting right atrial pressure of 3 mmHg.   Antimicrobials: Anti-infectives (From admission, onward)   Start     Dose/Rate Route Frequency Ordered Stop   01/26/21 1000  remdesivir 100 mg in sodium chloride 0.9 % 100 mL IVPB       "Followed by" Linked Group Details   100 mg 200 mL/hr over 30 Minutes Intravenous Daily 01/25/21 1034 01/27/21 0922   01/25/21 1200  remdesivir 200 mg in sodium chloride 0.9% 250 mL IVPB       "Followed by" Linked Group Details   200 mg 580 mL/hr over 30 Minutes Intravenous Once 01/25/21 1034 01/25/21 1555         Subjective: Passing more gas abd pain improved  Objective: Vitals:   01/26/21 2027 01/27/21 0441 01/27/21 0532 01/27/21 1329  BP: (!) 146/102 (!) 138/97 126/88 (!) 138/95  Pulse: 86 77 77 88  Resp: 15 18 13  (!) 22  Temp:   98.1 F (36.7 C) 97.7 F (36.5 C)  TempSrc:   Oral Oral  SpO2: 99% 98% 99% 100%  Weight:      Height:        Intake/Output Summary (Last 24 hours) at 01/27/2021 1543 Last data filed at 01/27/2021 1520 Gross per 24 hour  Intake 2682.83 ml  Output 350 ml  Net 2332.83 ml   Filed Weights   01/24/21 1644 01/25/21 0042  Weight: 60.9 kg 60 kg    Examination:  General: No acute distress. Cardiovascular: RRR Lungs: unlabored Abdomen: Soft, nontender,mildly distended Neurological: Alert and oriented 3. Moves all extremities 4. Cranial nerves II through XII grossly intact. Skin: Warm and dry. No rashes or lesions. Extremities: No clubbing or cyanosis. No edema.   Data Reviewed: I have personally reviewed following labs and imaging studies  CBC: Recent Labs  Lab 01/24/21 1935 01/25/21 0543 01/26/21 0535 01/27/21 0358  WBC 15.1* 11.3* 8.1 7.8  NEUTROABS 11.8*  --  5.1 4.1  HGB 12.3 12.2 10.4* 11.4*  HCT 37.0 38.3 32.1* 36.2  MCV 91.1 93.9 92.0 93.5  PLT 336 322 246 283    Basic  Metabolic Panel: Recent Labs  Lab 01/24/21 1641 01/24/21 1935 01/25/21 0543 01/26/21 0535 01/27/21 0358  NA  --  142 143 137 139  K  --  3.1* 3.1* 3.0* 4.3  CL  --  106 109 109 108  CO2  --  26 23 23 24   GLUCOSE  --  81 103* 76 59*  BUN  --  19 17 10 8   CREATININE  --  0.66 0.83 0.54 0.62  CALCIUM  --  8.0* 8.1* 7.8* 8.1*  MG 1.9  --  1.8 1.5* 1.8  PHOS  --   --  1.9* 1.4* 2.6    GFR: Estimated Creatinine Clearance: 61.1 mL/min (by C-G formula based on SCr of 0.62 mg/dL).  Liver Function Tests: Recent Labs  Lab 01/24/21 1935 01/26/21 0535 01/27/21 0358  AST 27 14* 16  ALT 26 18 18   ALKPHOS 68 50 59  BILITOT 1.3* 0.7 0.7  PROT 6.0* 4.5* 5.1*  ALBUMIN 2.9* 2.2* 2.5*    CBG: No results for input(s): GLUCAP in the last 168 hours.   Recent Results (from the past 240 hour(s))  Resp Panel by RT-PCR (Flu A&B, Covid) Nasopharyngeal Swab     Status: Abnormal   Collection Time: 01/24/21  5:34 PM   Specimen: Nasopharyngeal Swab; Nasopharyngeal(NP) swabs in vial transport medium  Result Value Ref Range Status   SARS Coronavirus 2 by RT PCR POSITIVE (A) NEGATIVE Final    Comment: RESULT CALLED TO, READ BACK BY AND VERIFIED WITH: RENITA, RN @ 8341 ON 01/25/21 C VARNER (NOTE) SARS-CoV-2 target nucleic acids are DETECTED.  The SARS-CoV-2 RNA is generally detectable in upper respiratory specimens during the acute phase of infection. Positive results are indicative of the presence of the identified virus, but do not rule out bacterial infection or co-infection with other pathogens not detected by the test. Clinical correlation with patient history and other diagnostic information is necessary to determine patient infection status. The expected result is Negative.  Fact Sheet for Patients: EntrepreneurPulse.com.au  Fact Sheet for Healthcare Providers: IncredibleEmployment.be  This test is not yet approved or cleared by the [place]  FDA and  has been authorized for detection and/or diagnosis of SARS-CoV-2 by FDA under an Emergency Use Authorization (EUA).  This EUA will remain in effect (meaning this test ca n be used) for the duration of  the COVID-19 declaration under Section 564(b)(1) of the Act, 21 U.S.C. section 360bbb-3(b)(1), unless the authorization is terminated or revoked sooner.     Influenza A by PCR NEGATIVE NEGATIVE Final   Influenza B by PCR NEGATIVE NEGATIVE Final    Comment: (NOTE) The Xpert Xpress SARS-CoV-2/FLU/RSV plus assay is intended as an aid in the diagnosis of influenza from Nasopharyngeal swab specimens and should not be used as a sole basis for treatment. Nasal washings and aspirates are unacceptable for Xpert Xpress SARS-CoV-2/FLU/RSV testing.  Fact Sheet for Patients: EntrepreneurPulse.com.au  Fact Sheet for Healthcare Providers: IncredibleEmployment.be  This test is not yet approved or cleared by the [place] FDA and has been authorized for detection and/or diagnosis of SARS-CoV-2 by FDA under an Emergency Use Authorization (EUA). This EUA will remain in effect (meaning this test can be used) for the duration of the COVID-19 declaration under Section 564(b)(1) of the Act, 21 U.S.C. section 360bbb-3(b)(1), unless the authorization is terminated or revoked.  Performed at [facility], [place] [address], [place], [place] [address]   C Difficile Quick Screen w PCR reflex     Status: None   Collection Time: 01/25/21 12:13 PM   Specimen: STOOL  Result Value Ref Range Status   C Diff antigen NEGATIVE NEGATIVE Final   C Diff toxin NEGATIVE NEGATIVE Final   C Diff interpretation No C. difficile detected.  Final    Comment: Performed at [facility], [place] [address] [place], [place] [address]  Gastrointestinal Panel by PCR , Stool     Status: None   Collection Time: 01/25/21 12:13 PM   Specimen: STOOL   Result Value Ref Range Status   Campylobacter species NOT DETECTED NOT DETECTED Final   Plesimonas shigelloides NOT DETECTED NOT DETECTED Final   Salmonella species NOT DETECTED NOT DETECTED Final   Yersinia enterocolitica NOT DETECTED NOT DETECTED Final   Vibrio species NOT DETECTED NOT DETECTED Final   Vibrio cholerae NOT DETECTED NOT DETECTED Final   Enteroaggregative E coli (EAEC) NOT DETECTED NOT DETECTED Final   Enteropathogenic E coli (  EPEC) NOT DETECTED NOT DETECTED Final   Enterotoxigenic E coli (ETEC) NOT DETECTED NOT DETECTED Final   Shiga like toxin producing E coli (STEC) NOT DETECTED NOT DETECTED Final   Shigella/Enteroinvasive E coli (EIEC) NOT DETECTED NOT DETECTED Final   Cryptosporidium NOT DETECTED NOT DETECTED Final   Cyclospora cayetanensis NOT DETECTED NOT DETECTED Final   Entamoeba histolytica NOT DETECTED NOT DETECTED Final   Giardia lamblia NOT DETECTED NOT DETECTED Final   Adenovirus F40/41 NOT DETECTED NOT DETECTED Final   Astrovirus NOT DETECTED NOT DETECTED Final   Norovirus GI/GII NOT DETECTED NOT DETECTED Final   Rotavirus A NOT DETECTED NOT DETECTED Final   Sapovirus (I, II, IV, and V) NOT DETECTED NOT DETECTED Final    Comment: Performed at [facility], [address] [place], [place] [address]         Radiology Studies: No results found.      Scheduled Meds: . enoxaparin (LOVENOX) injection  40 mg Subcutaneous Q24H  . feeding supplement  1 Container Oral TID BM  . lip balm  1 application Topical BID  . methylPREDNISolone (SOLU-MEDROL) injection  60 mg Intravenous Daily  . nicotine  7 mg Transdermal Daily  . pantoprazole (PROTONIX) IV  40 mg Intravenous Q24H  . phosphorus  500 mg Oral QID  . venlafaxine XR  300 mg Oral Q breakfast   Continuous Infusions: . lactated ringers Stopped (01/27/21 1410)  . methocarbamol (ROBAXIN) IV    . ondansetron (ZOFRAN) IV       LOS: 3 days    Time spent: over 30 min    Fayrene Helper, MD [facility]   To contact the attending provider between 7A-7P or the covering provider during after hours 7P-7A, please log into the web site [email or web address] and access using universal [place] password for that web site. If you do not have the password, please call the hospital operator.  01/27/2021, 3:43 PM

## 2021-01-27 NOTE — Progress Notes (Signed)
Subjective/Chief Complaint: Tolerating clears Less diarrhea Minimal pain   Objective: Vital signs in last 24 hours: Temp:  [98.1 F (36.7 C)] 98.1 F (36.7 C) (03/27 0532) Pulse Rate:  [77-86] 77 (03/27 0532) Resp:  [13-18] 13 (03/27 0532) BP: (126-146)/(88-102) 126/88 (03/27 0532) SpO2:  [98 %-99 %] 99 % (03/27 0532) Last BM Date: 01/27/21  Intake/Output from previous day: 03/26 0701 - 03/27 0700 In: 3987.6 [P.O.:720; I.V.:3167.6; IV Piggyback:100] Out: 650 [Urine:650] Intake/Output this shift: No intake/output data recorded.  Exam: Awake and alert Abdomen soft, ND, minimally tender  Lab Results:  Recent Labs    01/26/21 0535 01/27/21 0358  WBC 8.1 7.8  HGB 10.4* 11.4*  HCT 32.1* 36.2  PLT 246 283   BMET Recent Labs    01/26/21 0535 01/27/21 0358  NA 137 139  K 3.0* 4.3  CL 109 108  CO2 23 24  GLUCOSE 76 59*  BUN 10 8  CREATININE 0.54 0.62  CALCIUM 7.8* 8.1*   PT/INR No results for input(s): LABPROT, INR in the last 72 hours. ABG No results for input(s): PHART, HCO3 in the last 72 hours.  Invalid input(s): PCO2, PO2  Studies/Results: ECHOCARDIOGRAM LIMITED  Result Date: 01/25/2021    ECHOCARDIOGRAM LIMITED REPORT   Patient Name:   Cynthia [name] [facility] Date of Exam: 01/25/2021 Medical Rec #:  [id number]         Height:       63.0 in Accession #:    [id number]        Weight:       132.3 lb Date of Birth:  [birth year]        BSA:          1.622 m Patient Age:    60 years          BP:           140/88 mmHg Patient Gender: F                 HR:           86 bpm. Exam Location:  Inpatient Procedure: 2D Echo, Cardiac Doppler and Color Doppler Indications:    Edema  History:        Patient has no prior history of Echocardiogram examinations.                 Edema, Signs/Symptoms:Abdominal pain, Crohns; Risk                 Factors:Current Smoker and Hypertension.  Sonographer:    Dustin Flock Referring Phys: [phone] A CALDWELL POWELL JR  Sonographer  Comments: COVID+ IMPRESSIONS  1. Left ventricular ejection fraction, by estimation, is 60 to 65%. The left ventricle has normal function. The left ventricle has no regional wall motion abnormalities. Left ventricular diastolic parameters are consistent with Grade I diastolic dysfunction (impaired relaxation).  2. Right ventricular systolic function is normal. The right ventricular size is normal.  3. The mitral valve is normal in structure. No evidence of mitral valve regurgitation. No evidence of mitral stenosis.  4. The aortic valve is normal in structure. Aortic valve regurgitation is not visualized. No aortic stenosis is present.  5. The inferior vena cava is normal in size with greater than 50% respiratory variability, suggesting right atrial pressure of 3 mmHg. FINDINGS  Left Ventricle: Left ventricular ejection fraction, by estimation, is 60 to 65%. The left ventricle has normal function. The left ventricle has no regional wall motion abnormalities. The left  ventricular internal cavity size was normal in size. There is  no left ventricular hypertrophy. Left ventricular diastolic parameters are consistent with Grade I diastolic dysfunction (impaired relaxation). Normal left ventricular filling pressure. Right Ventricle: The right ventricular size is normal. No increase in right ventricular wall thickness. Right ventricular systolic function is normal. Left Atrium: Left atrial size was normal in size. Right Atrium: Right atrial size was normal in size. Pericardium: There is no evidence of pericardial effusion. Mitral Valve: The mitral valve is normal in structure. No evidence of mitral valve stenosis. Tricuspid Valve: The tricuspid valve is normal in structure. Tricuspid valve regurgitation is not demonstrated. No evidence of tricuspid stenosis. Aortic Valve: The aortic valve is normal in structure. Aortic valve regurgitation is not visualized. No aortic stenosis is present. Pulmonic Valve: The pulmonic valve  was normal in structure. Pulmonic valve regurgitation is not visualized. No evidence of pulmonic stenosis. Aorta: The aortic root is normal in size and structure. Venous: The inferior vena cava is normal in size with greater than 50% respiratory variability, suggesting right atrial pressure of 3 mmHg. IAS/Shunts: No atrial level shunt detected by color flow Doppler. LEFT VENTRICLE PLAX 2D LVIDd:         5.10 cm  Diastology LVIDs:         2.90 cm  LV e' medial:    7.51 cm/s LV PW:         1.00 cm  LV E/e' medial:  9.9 LV IVS:        1.00 cm  LV e' lateral:   11.10 cm/s LVOT diam:     2.00 cm  LV E/e' lateral: 6.7 LVOT Area:     3.14 cm  RIGHT VENTRICLE RV S prime:     14.70 cm/s LEFT ATRIUM         Index LA diam:    3.70 cm 2.28 cm/m   AORTA Ao Root diam: 2.50 cm MITRAL VALVE MV Area (PHT): 4.80 cm     SHUNTS MV Decel Time: 158 msec     Systemic Diam: 2.00 cm MV E velocity: 74.10 cm/s MV A velocity: 108.00 cm/s MV E/A ratio:  0.69 Mihai Croitoru MD Electronically signed by Sanda Klein MD Signature Date/Time: 01/25/2021/4:23:40 PM    Final     Anti-infectives: Anti-infectives (From admission, onward)   Start     Dose/Rate Route Frequency Ordered Stop   01/26/21 1000  remdesivir 100 mg in sodium chloride 0.9 % 100 mL IVPB       "Followed by" Linked Group Details   100 mg 200 mL/hr over 30 Minutes Intravenous Daily 01/25/21 1034 01/28/21 0959   01/25/21 1200  remdesivir 200 mg in sodium chloride 0.9% 250 mL IVPB       "Followed by" Linked Group Details   200 mg 580 mL/hr over 30 Minutes Intravenous Once 01/25/21 1034 01/25/21 1555      Assessment/Plan: HTN Hiatal hernia  Mitral valve prolapse RLS GERD Depression/anxiety  Crohn's disease with PSBO secondary to possible stricture  Continues slow improvement Will try full liquid diet Continuing conservative medical management  LOS: 3 days    [name] 01/27/2021

## 2021-01-28 DIAGNOSIS — K50012 Crohn's disease of small intestine with intestinal obstruction: Secondary | ICD-10-CM | POA: Diagnosis not present

## 2021-01-28 LAB — CBC WITH DIFFERENTIAL/PLATELET
Abs Immature Granulocytes: 0.03 10*3/uL (ref 0.00–0.07)
Basophils Absolute: 0 10*3/uL (ref 0.0–0.1)
Basophils Relative: 0 %
Eosinophils Absolute: 0.1 10*3/uL (ref 0.0–0.5)
Eosinophils Relative: 1 %
HCT: 37.1 % (ref 36.0–46.0)
Hemoglobin: 11.7 g/dL — ABNORMAL LOW (ref 12.0–15.0)
Immature Granulocytes: 0 %
Lymphocytes Relative: 38 %
Lymphs Abs: 2.8 10*3/uL (ref 0.7–4.0)
MCH: 29.4 pg (ref 26.0–34.0)
MCHC: 31.5 g/dL (ref 30.0–36.0)
MCV: 93.2 fL (ref 80.0–100.0)
Monocytes Absolute: 0.6 10*3/uL (ref 0.1–1.0)
Monocytes Relative: 8 %
Neutro Abs: 3.9 10*3/uL (ref 1.7–7.7)
Neutrophils Relative %: 53 %
Platelets: 259 10*3/uL (ref 150–400)
RBC: 3.98 MIL/uL (ref 3.87–5.11)
RDW: 14.9 % (ref 11.5–15.5)
WBC: 7.4 10*3/uL (ref 4.0–10.5)
nRBC: 0 % (ref 0.0–0.2)

## 2021-01-28 LAB — COMPREHENSIVE METABOLIC PANEL
ALT: 19 U/L (ref 0–44)
AST: 15 U/L (ref 15–41)
Albumin: 2.4 g/dL — ABNORMAL LOW (ref 3.5–5.0)
Alkaline Phosphatase: 59 U/L (ref 38–126)
Anion gap: 5 (ref 5–15)
BUN: 7 mg/dL — ABNORMAL LOW (ref 8–23)
CO2: 29 mmol/L (ref 22–32)
Calcium: 8.3 mg/dL — ABNORMAL LOW (ref 8.9–10.3)
Chloride: 103 mmol/L (ref 98–111)
Creatinine, Ser: 0.53 mg/dL (ref 0.44–1.00)
GFR, Estimated: >60 mL/min (ref >60)
Glucose, Bld: 58 mg/dL — ABNORMAL LOW (ref 70–99)
Potassium: 5.6 mmol/L — ABNORMAL HIGH (ref 3.5–5.1)
Sodium: 137 mmol/L (ref 135–145)
Total Bilirubin: 0.6 mg/dL (ref 0.3–1.2)
Total Protein: 5 g/dL — ABNORMAL LOW (ref 6.5–8.1)

## 2021-01-28 LAB — BASIC METABOLIC PANEL
Anion gap: 7 (ref 5–15)
BUN: 8 mg/dL (ref 8–23)
CO2: 26 mmol/L (ref 22–32)
Calcium: 8.2 mg/dL — ABNORMAL LOW (ref 8.9–10.3)
Chloride: 105 mmol/L (ref 98–111)
Creatinine, Ser: 0.36 mg/dL — ABNORMAL LOW (ref 0.44–1.00)
GFR, Estimated: >60 mL/min (ref >60)
Glucose, Bld: 123 mg/dL — ABNORMAL HIGH (ref 70–99)
Potassium: 4.8 mmol/L (ref 3.5–5.1)
Sodium: 138 mmol/L (ref 135–145)

## 2021-01-28 LAB — PHOSPHORUS: Phosphorus: 3 mg/dL (ref 2.5–4.6)

## 2021-01-28 LAB — HEMOGLOBIN A1C
Hgb A1c MFr Bld: 5.3 % (ref 4.8–5.6)
Mean Plasma Glucose: 105.41 mg/dL

## 2021-01-28 LAB — MAGNESIUM: Magnesium: 1.8 mg/dL (ref 1.7–2.4)

## 2021-01-28 LAB — GLUCOSE, CAPILLARY: Glucose-Capillary: 119 mg/dL — ABNORMAL HIGH (ref 70–99)

## 2021-01-28 MED ORDER — DIPHENHYDRAMINE HCL 12.5 MG/5ML PO ELIX
12.5000 mg | ORAL_SOLUTION | Freq: Four times a day (QID) | ORAL | Status: DC | PRN
  Administered 2021-01-29: 25 mg via ORAL
  Filled 2021-01-28: qty 10

## 2021-01-28 MED ORDER — PANTOPRAZOLE SODIUM 40 MG PO TBEC
40.0000 mg | DELAYED_RELEASE_TABLET | Freq: Every day | ORAL | Status: DC
  Administered 2021-01-29 – 2021-01-30 (×2): 40 mg via ORAL
  Filled 2021-01-28 (×2): qty 1

## 2021-01-28 NOTE — TOC Progression Note (Signed)
Transition of Care [facility]) - Progression Note    Patient Details  Name: Cynthia [name] MRN: [id number] Date of Birth: 1960-11-15  Transition of [facility]) CM/SW Contact  Purcell Mouton, RN Phone Number: 01/28/2021, 10:42 AM  Clinical Narrative:    TOC reviewed chart and will continue to follow for discharge needs.    Expected Discharge Plan: Home/Self Care Barriers to Discharge: No Barriers Identified  Expected Discharge Plan and Services Expected Discharge Plan: Home/Self Care       Living arrangements for the past 2 months: Single Family Home                                       Social Determinants of Health (SDOH) Interventions    Readmission Risk Interventions No flowsheet data found.

## 2021-01-28 NOTE — Progress Notes (Signed)
PROGRESS NOTE    Cynthia [name]  HTD:[id number] DOB: 02/01/1961 DOA: 01/24/2021 PCP: Cynthia Lung, MD   Chief Complaint  Patient presents with  . Abdominal Pain  . Nausea  . Diarrhea   Brief Narrative:  Cynthia [name] is [name] 60 y.o. female with medical history significant for HTN, Crohn's disease. She was recently admitted to the hospital on March 8 and discharged on March 13 for Crohn's ileitis with intestinal obstruction. Patient states she had been doing well until yesterday. After eating she started developing nausea vomiting. Continued to have nausea and vomiting all day today and not able to tolerate anything to eat or drink. She reports having chronic diarrhea which is unchanged for her. She estimates vomiting maybe 20-25 times since yesterday. Patient is having waves of abdominal pain throughout her abdomen. Abdominal pain is severe and rated as 10/10 at worst. She does not know of aggravating factors.She now feels like she is getting more bloated and her abdomen is distended. Patient is followed by [name]. She was referred to [name] surgeon at [name] due to insurance reasons. She was scheduled to have her appointment tomorrow.  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  Per GI, she'd benefit from surgical resection, though question is timing of this with COVID infection.  Pt/family looking into insurance issues? 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  Smoking cessation education to be provided before discharge  Hypoglycemia Repeat random, asymptomatic, follow Add on POC BG q4  DVT prophylaxis: lovenox Code Status: full  Family Communication: sister at bedside Disposition:   Status is: Inpatient  Remains inpatient appropriate because:Inpatient level of care appropriate due to severity of illness   Dispo: The patient is from: Home              Anticipated d/c is to: Home              Patient currently is not medically stable to d/c.   Difficult to place patient No       Consultants:   GI  Surgery  Procedures:  Echo IMPRESSIONS    1. Left ventricular ejection fraction, by estimation, is 60 to 65%. The  left ventricle has normal function.  The left ventricle has no regional  wall motion abnormalities. Left ventricular diastolic parameters are  consistent with Grade I diastolic  dysfunction (impaired relaxation).  2. Right ventricular systolic function is normal. The right ventricular  size is normal.  3. The mitral valve is normal in structure. No evidence of  mitral valve  regurgitation. No evidence of mitral stenosis.  4. The aortic valve is normal in structure. Aortic valve regurgitation is  not visualized. No aortic stenosis is present.  5. The inferior vena cava is normal in size with greater than 50%  respiratory variability, suggesting right atrial pressure of 3 mmHg.   Antimicrobials: Anti-infectives (From admission, onward)   Start     Dose/Rate Route Frequency Ordered Stop   01/26/21 1000  remdesivir 100 mg in sodium chloride 0.9 % 100 mL IVPB       "Followed by" Linked Group Details   100 mg 200 mL/hr over 30 Minutes Intravenous Daily 01/25/21 1034 01/27/21 0922   01/25/21 1200  remdesivir 200 mg in sodium chloride 0.9% 250 mL IVPB       "Followed by" Linked Group Details   200 mg 580 mL/hr over 30 Minutes Intravenous Once 01/25/21 1034 01/25/21 1555      Subjective: Feeling [name] little better  Objective: Vitals:   01/27/21 2125 01/28/21 0526 01/28/21 1351 01/28/21 1352  BP: (!) 147/97 (!) 149/91 (!) 140/103 (!) 151/106  Pulse: 78 73 90 92  Resp: 13 13 18    Temp: 98 F (36.7 C) (!) 97.4 F (36.3 C) 97.8 F (36.6 C)   TempSrc: Oral Oral Oral   SpO2: 100% 100% 100%   Weight:      Height:        Intake/Output Summary (Last 24 hours) at 01/28/2021 1535 Last data filed at 01/28/2021 0516 Gross per 24 hour  Intake 1758.3 ml  Output --  Net 1758.3 ml   Filed Weights   01/24/21 1644 01/25/21 0042  Weight: 60.9 kg 60 kg    Examination:  General: No acute distress. Cardiovascular: RRR Lungs: unlabored Abdomen: Soft, nontender, nondistended Neurological: Alert and oriented 3. Moves all extremities 4. Cranial nerves II through XII grossly intact. Skin: Warm and dry. No rashes or lesions. Extremities: No clubbing or cyanosis. No edema  Data Reviewed: I have personally reviewed following labs and imaging studies  CBC: Recent Labs  Lab 01/24/21 1935 01/25/21 0543 01/26/21 0535 01/27/21 0358 01/28/21 0408   WBC 15.1* 11.3* 8.1 7.8 7.4  NEUTROABS 11.8*  --  5.1 4.1 3.9  HGB 12.3 12.2 10.4* 11.4* 11.7*  HCT 37.0 38.3 32.1* 36.2 37.1  MCV 91.1 93.9 92.0 93.5 93.2  PLT 336 322 246 283 347    Basic Metabolic Panel: Recent Labs  Lab 01/24/21 1641 01/24/21 1935 01/25/21 0543 01/26/21 0535 01/27/21 0358 01/27/21 1620 01/28/21 0408 01/28/21 1235  NA  --    < > 143 137 139  --  137 138  K  --    < > 3.1* 3.0* 4.3  --  5.6* 4.8  CL  --    < > 109 109 108  --  103 105  CO2  --    < > 23 23 24   --  29 26  GLUCOSE  --    < > 103* 76 59* 151* 58* 123*  BUN  --    < > 17 10 8   --  7* 8  CREATININE  --    < > 0.83 0.54  0.62  --  0.53 0.36*  CALCIUM  --    < > 8.1* 7.8* 8.1*  --  8.3* 8.2*  MG 1.9  --  1.8 1.5* 1.8  --  1.8  --   PHOS  --   --  1.9* 1.4* 2.6  --  3.0  --    < > = values in this interval not displayed.    GFR: Estimated Creatinine Clearance: 61.1 mL/min ([place]) (by C-G formula based on SCr of 0.36 mg/dL (L)).  Liver Function Tests: Recent Labs  Lab 01/24/21 1935 01/26/21 0535 01/27/21 0358 01/28/21 0408  AST 27 14* 16 15  ALT 26 18 18 19   ALKPHOS 68 50 59 59  BILITOT 1.3* 0.7 0.7 0.6  PROT 6.0* 4.5* 5.1* 5.0*  ALBUMIN 2.9* 2.2* 2.5* 2.4*    CBG: No results for input(s): GLUCAP in the last 168 hours.   Recent Results (from the past 240 hour(s))  Resp Panel by RT-PCR (Flu Milika Ventress&B, Covid) Nasopharyngeal Swab     Status: Abnormal   Collection Time: 01/24/21  5:34 PM   Specimen: Nasopharyngeal Swab; Nasopharyngeal(NP) swabs in vial transport medium  Result Value Ref Range Status   SARS Coronavirus 2 by RT PCR POSITIVE (Zaina Jenkin) NEGATIVE Final    Comment: RESULT CALLED TO, READ BACK BY AND VERIFIED WITH: RENITA, RN @ 7253 ON 01/25/21 C VARNER (NOTE) SARS-CoV-2 target nucleic acids are DETECTED.  The SARS-CoV-2 RNA is generally detectable in upper respiratory specimens during the acute phase of infection. Positive results are indicative of the presence of the identified  virus, but do not rule out bacterial infection or co-infection with other pathogens not detected by the test. Clinical correlation with patient history and other diagnostic information is necessary to determine patient infection status. The expected result is Negative.  Fact Sheet for Patients: EntrepreneurPulse.com.au  Fact Sheet for Healthcare Providers: IncredibleEmployment.be  This test is not yet approved or cleared by the [place] FDA and  has been authorized for detection and/or diagnosis of SARS-CoV-2 by FDA under an Emergency Use Authorization (EUA).  This EUA will remain in effect (meaning this test ca n be used) for the duration of  the COVID-19 declaration under Section 564(b)(1) of the Act, 21 U.S.C. section 360bbb-3(b)(1), unless the authorization is terminated or revoked sooner.     Influenza Jermey Closs by PCR NEGATIVE NEGATIVE Final   Influenza B by PCR NEGATIVE NEGATIVE Final    Comment: (NOTE) The Xpert Xpress SARS-CoV-2/FLU/RSV plus assay is intended as an aid in the diagnosis of influenza from Nasopharyngeal swab specimens and should not be used as Ita Fritzsche sole basis for treatment. Nasal washings and aspirates are unacceptable for Xpert Xpress SARS-CoV-2/FLU/RSV testing.  Fact Sheet for Patients: EntrepreneurPulse.com.au  Fact Sheet for Healthcare Providers: IncredibleEmployment.be  This test is not yet approved or cleared by the [place] FDA and has been authorized for detection and/or diagnosis of SARS-CoV-2 by FDA under an Emergency Use Authorization (EUA). This EUA will remain in effect (meaning this test can be used) for the duration of the COVID-19 declaration under Section 564(b)(1) of the Act, 21 U.S.C. section 360bbb-3(b)(1), unless the authorization is terminated or revoked.  Performed at [facility], [place] [address] [place], [place] [address]   C Difficile  Quick Screen w PCR reflex     Status: None   Collection Time: 01/25/21 12:13 PM   Specimen: STOOL  Result Value Ref Range Status   C Diff antigen NEGATIVE NEGATIVE Final  C Diff toxin NEGATIVE NEGATIVE Final   C Diff interpretation No C. difficile detected.  Final    Comment: Performed at [name], [place] [address], [place], [place] [address]  Gastrointestinal Panel by PCR , Stool     Status: None   Collection Time: 01/25/21 12:13 PM   Specimen: STOOL  Result Value Ref Range Status   Campylobacter species NOT DETECTED NOT DETECTED Final   Plesimonas shigelloides NOT DETECTED NOT DETECTED Final   Salmonella species NOT DETECTED NOT DETECTED Final   Yersinia enterocolitica NOT DETECTED NOT DETECTED Final   Vibrio species NOT DETECTED NOT DETECTED Final   Vibrio cholerae NOT DETECTED NOT DETECTED Final   Enteroaggregative E coli (EAEC) NOT DETECTED NOT DETECTED Final   Enteropathogenic E coli (EPEC) NOT DETECTED NOT DETECTED Final   Enterotoxigenic E coli (ETEC) NOT DETECTED NOT DETECTED Final   Shiga like toxin producing E coli (STEC) NOT DETECTED NOT DETECTED Final   Shigella/Enteroinvasive E coli (EIEC) NOT DETECTED NOT DETECTED Final   Cryptosporidium NOT DETECTED NOT DETECTED Final   Cyclospora cayetanensis NOT DETECTED NOT DETECTED Final   Entamoeba histolytica NOT DETECTED NOT DETECTED Final   Giardia lamblia NOT DETECTED NOT DETECTED Final   Adenovirus F40/41 NOT DETECTED NOT DETECTED Final   Astrovirus NOT DETECTED NOT DETECTED Final   Norovirus GI/GII NOT DETECTED NOT DETECTED Final   Rotavirus Wasim Hurlbut NOT DETECTED NOT DETECTED Final   Sapovirus (I, II, IV, and V) NOT DETECTED NOT DETECTED Final    Comment: Performed at [facility], [address], [place], [place] [address]         Radiology Studies: No results found.      Scheduled Meds: . enoxaparin (LOVENOX) injection  40 mg Subcutaneous Q24H  . feeding supplement  1 Container Oral  TID BM  . lip balm  1 application Topical BID  . methylPREDNISolone (SOLU-MEDROL) injection  60 mg Intravenous Daily  . nicotine  7 mg Transdermal Daily  . [START ON 01/29/2021] pantoprazole  40 mg Oral Daily  . venlafaxine XR  300 mg Oral Q breakfast   Continuous Infusions: . methocarbamol (ROBAXIN) IV    . ondansetron (ZOFRAN) IV       LOS: 4 days    Time spent: over 30 min    Fayrene Helper, MD [facility]   To contact the attending provider between 7A-7P or the covering provider during after hours 7P-7A, please log into the web site [email or web address] and access using universal [place] password for that web site. If you do not have the password, please call the hospital operator.  01/28/2021, 3:35 PM

## 2021-01-28 NOTE — Progress Notes (Signed)
    CC: abdominal pain  Subjective: Doing well on full liquids, angry about insurance issues, and COVID.  No BM yesterday and she can't walk out of the room.  Abdomen is soft, she is having some GERD issues, but overall seems to be doing well.    Objective: Vital signs in last 24 hours: Temp:  [97.4 F (36.3 C)-98 F (36.7 C)] 97.4 F (36.3 C) (03/28 0526) Pulse Rate:  [73-88] 73 (03/28 0526) Resp:  [13-22] 13 (03/28 0526) BP: (138-149)/(91-97) 149/91 (03/28 0526) SpO2:  [100 %] 100 % (03/28 0526) Last BM Date: 01/28/21 600 PO  1636 IV NO Bm recorded Afebrile, VSS, BP 149/91 K+ 5.6, glucose 58 WBC 7.4   Intake/Output from previous day: 03/27 0701 - 03/28 0700 In: 2237 [P.O.:600; I.V.:1637] Out: -  Intake/Output this shift: No intake/output data recorded.  General appearance: alert, cooperative and no distress Resp: clear to auscultation bilaterally GI: soft, non tender, not distended, + BS, No BM yesterday, some GERD issues.    Lab Results:  Recent Labs    01/27/21 0358 01/28/21 0408  WBC 7.8 7.4  HGB 11.4* 11.7*  HCT 36.2 37.1  PLT 283 259    BMET Recent Labs    01/27/21 0358 01/27/21 1620 01/28/21 0408  NA 139  --  137  K 4.3  --  5.6*  CL 108  --  103  CO2 24  --  29  GLUCOSE 59* 151* 58*  BUN 8  --  7*  CREATININE 0.62  --  0.53  CALCIUM 8.1*  --  8.3*   PT/INR No results for input(s): LABPROT, INR in the last 72 hours.  Recent Labs  Lab 01/24/21 1935 01/26/21 0535 01/27/21 0358 01/28/21 0408  AST 27 14* 16 15  ALT 26 18 18 19   ALKPHOS 68 50 59 59  BILITOT 1.3* 0.7 0.7 0.6  PROT 6.0* 4.5* 5.1* 5.0*  ALBUMIN 2.9* 2.2* 2.5* 2.4*     Lipase     Component Value Date/Time   LIPASE 19 01/24/2021 1935     Medications:  Scheduled Meds: . enoxaparin (LOVENOX) injection  40 mg Subcutaneous Q24H  . feeding supplement  1 Container Oral TID BM  . lip balm  1 application Topical BID  . methylPREDNISolone (SOLU-MEDROL) injection  60  mg Intravenous Daily  . nicotine  7 mg Transdermal Daily  . pantoprazole (PROTONIX) IV  40 mg Intravenous Q24H  . venlafaxine XR  300 mg Oral Q breakfast   Continuous Infusions: . lactated ringers 125 mL/hr at 01/28/21 0225  . methocarbamol (ROBAXIN) IV    . ondansetron (ZOFRAN) IV     PRN Meds:.acetaminophen **OR** acetaminophen, alum & mag hydroxide-simeth, bisacodyl, diphenhydrAMINE, HYDROmorphone (DILAUDID) injection, magic mouthwash, methocarbamol (ROBAXIN) IV, metoprolol tartrate, ondansetron (ZOFRAN) IV **OR** ondansetron (ZOFRAN) IV, ondansetron **OR** ondansetron (ZOFRAN) IV, prochlorperazine  Assessment/Plan COVID positive HTN Hiatal hernia  Mitral valve prolapse RLS GERD Depression/anxiety Moderate malnutrition - Pre albumin 9.7  Crohn's ileitis with SBO/possible stricture - on prednisone Needs surgery at [facility]([employer] insurance)  FEN: IV fluids/full liquids ID: remdesivir 3/25-3/27 DVT:  lovenox  Plan:  I would keep her on full liquids for now, add pepcid for her GERD, and give her more time before advancing diet.        LOS: 4 days    JENNINGS,WILLARD 01/28/2021 Please see Amion

## 2021-01-28 NOTE — Progress Notes (Addendum)
Attending physician's note   I have taken an interval history, reviewed the chart and discussed her care at length. I agree with the Advanced Practitioner's note, impression, and recommendations as outlined.   Overall, she is feeling better.  Tolerating liquid diet and hopeful for surgical intervention in the near future.  No change from a medical therapy standpoint.  Appreciate coordinated care by the primary Hospitalist service and the consulting Surgical service, to include whether or not she can get some form of surgical coverage while she is currently inpatient.  GI service will continue to follow along.  [address], DO, FACG [phone] office             Progress Note   Subjective  Chief Complaint: Crohn's ileitis with recurrent obstructive symptoms  This morning, the patient is frustrated given that her insurance apparently will not cover surgery at our hospital.  She will need to have this done elsewhere, she tells me [name] is covered.  Overall she is having very few symptoms, currently on a full liquid diet, though "not enjoying the grits".  Tells me that her biggest complaint is of reflux symptoms, but it looks like surgery just added Pepcid.  Patient is wanting there to be a plan for her to get out of here.   Objective   Vital signs in last 24 hours: Temp:  [97.4 F (36.3 C)-98 F (36.7 C)] 97.4 F (36.3 C) (03/28 0526) Pulse Rate:  [73-88] 73 (03/28 0526) Resp:  [13-22] 13 (03/28 0526) BP: (138-149)/(91-97) 149/91 (03/28 0526) SpO2:  [100 %] 100 % (03/28 0526) Last BM Date: 01/28/21 General:    white female in NAD Heart:  Regular rate and rhythm; no murmurs Lungs: Respirations even and unlabored, lungs CTA bilaterally Abdomen:  Soft, nontender and nondistended. Normal bowel sounds. Psych:  Cooperative. Normal mood and affect.  Intake/Output from previous day: 03/27 0701 - 03/28 0700 In: 2237 [P.O.:600; I.V.:1637] Out: -   Lab Results: Recent Labs     01/26/21 0535 01/27/21 0358 01/28/21 0408  WBC 8.1 7.8 7.4  HGB 10.4* 11.4* 11.7*  HCT 32.1* 36.2 37.1  PLT 246 283 259   BMET Recent Labs    01/26/21 0535 01/27/21 0358 01/27/21 1620 01/28/21 0408  NA 137 139  --  137  K 3.0* 4.3  --  5.6*  CL 109 108  --  103  CO2 23 24  --  29  GLUCOSE 76 59* 151* 58*  BUN 10 8  --  7*  CREATININE 0.54 0.62  --  0.53  CALCIUM 7.8* 8.1*  --  8.3*   LFT Recent Labs    01/28/21 0408  PROT 5.0*  ALBUMIN 2.4*  AST 15  ALT 19  ALKPHOS 59  BILITOT 0.6    Assessment / Plan:   Assessment: 1.  Crohn's ileitis with recurrent obstructive symptoms due to ileal stricture-currently improved on IV Solu-Medrol 69m, continues to have some diarrhea, stool studies negative, surgery cannot proceed as her insurance is does not cover them 2.  GERD 3.  Covid positive, asymptomatic: Received remdesivir  Plan: 1.  I believe plan will be to get her on oral Prednisone so that she can leave the hospital and follow-up with a surgical team who can help her.  Will discuss this with Dr. GLyndel Safefurther today. 2.  Continue current measures 3.  Please await further recommendations from Dr. CBryan Lemmalater today  Thank you for kind consultation, we will continue to follow.  LOS: 4 days   [name]  01/28/2021, 11:02 AM

## 2021-01-29 LAB — CBC WITH DIFFERENTIAL/PLATELET
Abs Immature Granulocytes: 0.03 10*3/uL (ref 0.00–0.07)
Basophils Absolute: 0 10*3/uL (ref 0.0–0.1)
Basophils Relative: 0 %
Eosinophils Absolute: 0.1 10*3/uL (ref 0.0–0.5)
Eosinophils Relative: 1 %
HCT: 39.7 % (ref 36.0–46.0)
Hemoglobin: 12.8 g/dL (ref 12.0–15.0)
Immature Granulocytes: 0 %
Lymphocytes Relative: 35 %
Lymphs Abs: 3.5 10*3/uL (ref 0.7–4.0)
MCH: 29.6 pg (ref 26.0–34.0)
MCHC: 32.2 g/dL (ref 30.0–36.0)
MCV: 91.7 fL (ref 80.0–100.0)
Monocytes Absolute: 0.7 10*3/uL (ref 0.1–1.0)
Monocytes Relative: 7 %
Neutro Abs: 5.5 10*3/uL (ref 1.7–7.7)
Neutrophils Relative %: 57 %
Platelets: 320 10*3/uL (ref 150–400)
RBC: 4.33 MIL/uL (ref 3.87–5.11)
RDW: 15 % (ref 11.5–15.5)
WBC: 9.9 10*3/uL (ref 4.0–10.5)
nRBC: 0 % (ref 0.0–0.2)

## 2021-01-29 LAB — COMPREHENSIVE METABOLIC PANEL
ALT: 21 U/L (ref 0–44)
AST: 24 U/L (ref 15–41)
Albumin: 2.6 g/dL — ABNORMAL LOW (ref 3.5–5.0)
Alkaline Phosphatase: 62 U/L (ref 38–126)
Anion gap: 6 (ref 5–15)
BUN: 6 mg/dL — ABNORMAL LOW (ref 8–23)
CO2: 27 mmol/L (ref 22–32)
Calcium: 8.5 mg/dL — ABNORMAL LOW (ref 8.9–10.3)
Chloride: 102 mmol/L (ref 98–111)
Creatinine, Ser: 0.56 mg/dL (ref 0.44–1.00)
GFR, Estimated: >60 mL/min (ref >60)
Glucose, Bld: 94 mg/dL (ref 70–99)
Potassium: 4 mmol/L (ref 3.5–5.1)
Sodium: 135 mmol/L (ref 135–145)
Total Bilirubin: 0.5 mg/dL (ref 0.3–1.2)
Total Protein: 5.5 g/dL — ABNORMAL LOW (ref 6.5–8.1)

## 2021-01-29 LAB — GLUCOSE, CAPILLARY
Glucose-Capillary: 100 mg/dL — ABNORMAL HIGH (ref 70–99)
Glucose-Capillary: 109 mg/dL — ABNORMAL HIGH (ref 70–99)
Glucose-Capillary: 118 mg/dL — ABNORMAL HIGH (ref 70–99)
Glucose-Capillary: 148 mg/dL — ABNORMAL HIGH (ref 70–99)
Glucose-Capillary: 155 mg/dL — ABNORMAL HIGH (ref 70–99)
Glucose-Capillary: 55 mg/dL — ABNORMAL LOW (ref 70–99)
Glucose-Capillary: 56 mg/dL — ABNORMAL LOW (ref 70–99)
Glucose-Capillary: 59 mg/dL — ABNORMAL LOW (ref 70–99)
Glucose-Capillary: 64 mg/dL — ABNORMAL LOW (ref 70–99)
Glucose-Capillary: 65 mg/dL — ABNORMAL LOW (ref 70–99)
Glucose-Capillary: 66 mg/dL — ABNORMAL LOW (ref 70–99)
Glucose-Capillary: 86 mg/dL (ref 70–99)
Glucose-Capillary: 94 mg/dL (ref 70–99)

## 2021-01-29 LAB — MAGNESIUM: Magnesium: 2 mg/dL (ref 1.7–2.4)

## 2021-01-29 LAB — PHOSPHORUS: Phosphorus: 3.4 mg/dL (ref 2.5–4.6)

## 2021-01-29 MED ORDER — DEXTROSE 50 % IV SOLN
25.0000 mL | Freq: Once | INTRAVENOUS | Status: AC
  Administered 2021-01-29: 25 mL via INTRAVENOUS
  Filled 2021-01-29: qty 50

## 2021-01-29 MED ORDER — SIMETHICONE 40 MG/0.6ML PO SUSP
40.0000 mg | Freq: Four times a day (QID) | ORAL | Status: DC | PRN
  Administered 2021-01-29: 40 mg via ORAL
  Filled 2021-01-29 (×3): qty 0.6

## 2021-01-29 NOTE — Progress Notes (Addendum)
   01/29/21 0600  Provider Notification  Provider Name/Title B. Chotinee  Date Provider Notified 01/29/21  Time Provider Notified 0601  Notification Type Page  Notification Reason Other (Comment) (BS 66)  Test performed and critical result BS (66)  Date Critical Result Received 01/29/21  Time Critical Result Received 0630  Provider response See new orders  Date of Provider Response 01/29/21   D5 was administered as order BS recheck at 118 no s/s of distress will continue present plan of care.

## 2021-01-29 NOTE — Progress Notes (Addendum)
[facility] Progress Note     Subjective: CC-  Overall better since admission. Tolerating FLD. BM yesterday morning. Feels gurgling in her abdomen. Denies n/v.   Objective: Vital signs in last 24 hours: Temp:  [97.6 F (36.4 C)-98.2 F (36.8 C)] 97.6 F (36.4 C) (03/29 0451) Pulse Rate:  [72-92] 72 (03/29 0451) Resp:  [18] 18 (03/29 0451) BP: (121-155)/(85-110) 149/89 (03/29 0451) SpO2:  [10 %-100 %] 100 % (03/29 0451) Last BM Date: 01/28/21  Intake/Output from previous day: 03/28 0701 - 03/29 0700 In: 240 [P.O.:240] Out: -  Intake/Output this shift: No intake/output data recorded.  PE: Gen:  Alert, NAD, pleasant Pulm:  rate and effort normal on room air Abd: Soft, NT/ND Skin: no rashes noted, warm and dry  Lab Results:  Recent Labs    01/27/21 0358 01/28/21 0408  WBC 7.8 7.4  HGB 11.4* 11.7*  HCT 36.2 37.1  PLT 283 259   BMET Recent Labs    01/28/21 0408 01/28/21 1235  NA 137 138  K 5.6* 4.8  CL 103 105  CO2 29 26  GLUCOSE 58* 123*  BUN 7* 8  CREATININE 0.53 0.36*  CALCIUM 8.3* 8.2*   PT/INR No results for input(s): LABPROT, INR in the last 72 hours. CMP     Component Value Date/Time   NA 138 01/28/2021 1235   NA 140 12/18/2020 1548   K 4.8 01/28/2021 1235   CL 105 01/28/2021 1235   CO2 26 01/28/2021 1235   GLUCOSE 123 (H) 01/28/2021 1235   BUN 8 01/28/2021 1235   BUN 11 12/18/2020 1548   CREATININE 0.36 (L) 01/28/2021 1235   CREATININE 0.94 05/08/2016 0741   CALCIUM 8.2 (L) 01/28/2021 1235   PROT 5.0 (L) 01/28/2021 0408   PROT 6.2 12/18/2020 1548   ALBUMIN 2.4 (L) 01/28/2021 0408   ALBUMIN 3.6 (L) 12/18/2020 1548   AST 15 01/28/2021 0408   ALT 19 01/28/2021 0408   ALKPHOS 59 01/28/2021 0408   BILITOT 0.6 01/28/2021 0408   BILITOT 0.7 12/18/2020 1548   GFRNONAA >60 01/28/2021 1235   GFRAA 77 12/18/2020 1548   Lipase     Component Value Date/Time   LIPASE 19 01/24/2021 1935       Studies/Results: No results  found.  Anti-infectives: Anti-infectives (From admission, onward)   Start     Dose/Rate Route Frequency Ordered Stop   01/26/21 1000  remdesivir 100 mg in sodium chloride 0.9 % 100 mL IVPB       "Followed by" Linked Group Details   100 mg 200 mL/hr over 30 Minutes Intravenous Daily 01/25/21 1034 01/27/21 0922   01/25/21 1200  remdesivir 200 mg in sodium chloride 0.9% 250 mL IVPB       "Followed by" Linked Group Details   200 mg 580 mL/hr over 30 Minutes Intravenous Once 01/25/21 1034 01/25/21 1555       Assessment/Plan COVID positive HTN Hiatal hernia  Mitral valve prolapse RLS GERD Depression/anxiety Moderate malnutrition - Pre albumin 9.7  Crohn's ileitis with SBO/possible stricture  - GI following, on prednisone - Needs surgery at [facility] insurance)  FEN: IV fluids/full liquids ID: remdesivir 3/25-3/27 DVT:  lovenox  Plan:  Tolerating full liquids and had a BM yesterday. Case management consult placed yesterday for assistance looking into whether or not her insurance would cover surgery during hospitalization here if needed. However, she is covid+ and would not be an ideal time for an operation. Overall patient is improved. She  is tolerating diet and having bowel function. Ok for discharge home on puree diet with outpatient GI follow up. Also follow up with surgeon at [facility] if insurance will not cover [facility].   LOS: 5 days    [facility] 01/29/2021, 8:31 AM Please see Amion for pager number during day hours 7:00am-4:30pm

## 2021-01-29 NOTE — Progress Notes (Signed)
PROGRESS NOTE    Cynthia [name]  OAC:[id number] DOB: 1961-09-21 DOA: 01/24/2021 PCP: Denita Lung, MD   Chief Complaint  Patient presents with  . Abdominal Pain  . Nausea  . Diarrhea   Brief Narrative:  Cynthia [name] is Cynthia [name] 60 y.o. female with medical history significant for HTN, Crohn's disease. She was recently admitted to the hospital on March 8 and discharged on March 13 for Crohn's ileitis with intestinal obstruction. Patient states she had been doing well until yesterday. After eating she started developing nausea vomiting. Continued to have nausea and vomiting all day today and not able to tolerate anything to eat or drink. She reports having chronic diarrhea which is unchanged for her. She estimates vomiting maybe 20-25 times since yesterday. Patient is having waves of abdominal pain throughout her abdomen. Abdominal pain is severe and rated as 10/10 at worst. She does not know of aggravating factors.She now feels like she is getting more bloated and her abdomen is distended. Patient is followed by [facility] gastroenterology. She was referred to Cynthia [name] colorectal surgeon at [facility] due to insurance reasons.  She's been admitted with crohn's ileitis.  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  Per GI, she'd benefit from surgical resection, though question is timing of this with COVID infection.  Pt/family looking into  insurance issues? 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  Smoking cessation education to be provided before discharge  Hypoglycemia Recurrent this AM, not clearly symptomatic Follow trend over day today  DVT prophylaxis: lovenox Code Status: full  Family Communication: sister at bedside Disposition:   Status is: Inpatient  Remains inpatient appropriate because:Inpatient level of care appropriate due to severity of illness   Dispo: The patient is from: Home              Anticipated d/c is to: Home              Patient currently is not medically stable to d/c.   Difficult to place patient No       Consultants:   GI  Surgery  Procedures:  Echo IMPRESSIONS    1. Left ventricular ejection fraction, by estimation, is 60 to 65%.  The  left ventricle has normal function. The left ventricle has no regional  wall motion abnormalities. Left ventricular diastolic parameters are  consistent with Grade I diastolic  dysfunction (impaired relaxation).  2. Right ventricular systolic function is normal. The right ventricular  size is normal.  3. The mitral valve  is normal in structure. No evidence of mitral valve  regurgitation. No evidence of mitral stenosis.  4. The aortic valve is normal in structure. Aortic valve regurgitation is  not visualized. No aortic stenosis is present.  5. The inferior vena cava is normal in size with greater than 50%  respiratory variability, suggesting right atrial pressure of 3 mmHg.   Antimicrobials: Anti-infectives (From admission, onward)   Start     Dose/Rate Route Frequency Ordered Stop   01/26/21 1000  remdesivir 100 mg in sodium chloride 0.9 % 100 mL IVPB       "Followed by" Linked Group Details   100 mg 200 mL/hr over 30 Minutes Intravenous Daily 01/25/21 1034 01/27/21 0922   01/25/21 1200  remdesivir 200 mg in sodium chloride 0.9% 250 mL IVPB       "Followed by" Linked Group Details   200 mg 580 mL/hr over 30 Minutes Intravenous Once 01/25/21 1034 01/25/21 1555      Subjective: Has taken half step back   Objective: Vitals:   01/28/21 1804 01/28/21 2010 01/29/21 0451 01/29/21 1207  BP: (!) 155/110 121/85 (!) 149/89 (!) 141/105  Pulse:  79 72 90  Resp: 18 18 18 14   Temp: 98.2 F (36.8 C) 97.7 F (36.5 C) 97.6 F (36.4 C) 98.2 F (36.8 C)  TempSrc: Oral Oral Oral Oral  SpO2: (!) 10% 100% 100% 99%  Weight:      Height:       No intake or output data in the 24 hours ending 01/29/21 1808 Filed Weights   01/24/21 1644 01/25/21 0042  Weight: 60.9 kg 60 kg    Examination:  General: No acute distress. Cardiovascular: RRR Lungs: unlabored Abdomen: Soft, nontender, mildly distended Neurological: Alert and oriented 3. Moves all extremities 4 . Cranial nerves II through XII grossly intact. Skin: Warm and dry. No rashes or lesions. Extremities: No clubbing or cyanosis. No edema.   Data Reviewed: I have personally reviewed following labs and imaging studies  CBC: Recent Labs  Lab 01/24/21 1935 01/25/21 0543 01/26/21 0535 01/27/21 0358 01/28/21 0408 01/29/21 0938  WBC 15.1* 11.3*  8.1 7.8 7.4 9.9  NEUTROABS 11.8*  --  5.1 4.1 3.9 5.5  HGB 12.3 12.2 10.4* 11.4* 11.7* 12.8  HCT 37.0 38.3 32.1* 36.2 37.1 39.7  MCV 91.1 93.9 92.0 93.5 93.2 91.7  PLT 336 322 246 283 259 709    Basic Metabolic Panel: Recent Labs  Lab 01/25/21 0543 01/26/21 0535 01/27/21 0358 01/27/21 1620 01/28/21 0408 01/28/21 1235 01/29/21 0938  NA 143 137 139  --  137 138 135  K 3.1* 3.0* 4.3  --  5.6* 4.8 4.0  CL 109 109 108  --  103 105 102  CO2 23 23 24   --  29 26 27   GLUCOSE 103* 76 59* 151* 58* 123* 94  BUN 17 10 8   --  7* 8 6*  CREATININE 0.83 0.54 0.62  --  0.53 0.36* 0.56  CALCIUM 8.1* 7.8* 8.1*  --  8.3* 8.2* 8.5*  MG 1.8 1.5* 1.8  --  1.8  --  2.0  PHOS 1.9* 1.4* 2.6  --  3.0  --  3.4    GFR: Estimated Creatinine Clearance: 61.1 mL/min (by C-G formula based on SCr of 0.56 mg/dL).  Liver Function Tests: Recent Labs  Lab 01/24/21 1935 01/26/21 0535 01/27/21 0358 01/28/21 0408 01/29/21 0938  AST 27 14* 16 15 24   ALT 26 18 18 19 21   ALKPHOS 68 50 59 59 62  BILITOT 1.3* 0.7 0.7 0.6 0.5  PROT 6.0* 4.5* 5.1* 5.0* 5.5*  ALBUMIN 2.9* 2.2* 2.5* 2.4* 2.6*    CBG: Recent Labs  Lab 01/29/21 0807 01/29/21 0843 01/29/21 0941 01/29/21 1202 01/29/21 1556  GLUCAP 59* 56* 86 94 155*     Recent Results (from the past 240 hour(s))  Resp Panel by RT-PCR (Flu Brentin Shin&B, Covid) Nasopharyngeal Swab     Status: Abnormal   Collection Time: 01/24/21  5:34 PM   Specimen: Nasopharyngeal Swab; Nasopharyngeal(NP) swabs in vial transport medium  Result Value Ref Range Status   SARS Coronavirus 2 by RT PCR POSITIVE ([place]) NEGATIVE Final    Comment: RESULT CALLED TO, READ BACK BY AND VERIFIED WITH: RENITA, RN @ 1884 ON 01/25/21 C VARNER (NOTE) SARS-CoV-2 target nucleic acids are DETECTED.  The SARS-CoV-2 RNA is generally detectable in upper respiratory specimens during the acute phase of infection. Positive results are indicative of the presence of the identified virus, but do not rule out  bacterial infection or co-infection with other pathogens not detected by the test. Clinical correlation with patient history and other diagnostic information is necessary to determine patient infection status. The expected result is Negative.  Fact Sheet for Patients: EntrepreneurPulse.com.au  Fact Sheet for Healthcare Providers: IncredibleEmployment.be  This test is not yet approved or cleared by the [place] FDA and  has been authorized for detection and/or diagnosis of SARS-CoV-2 by FDA under an Emergency Use Authorization (EUA).  This EUA will remain in effect (meaning this test ca n be used) for the duration of  the COVID-19 declaration under Section 564(b)(1) of the Act, 21 U.S.C. section 360bbb-3(b)(1), unless the authorization is terminated or revoked sooner.     Influenza [place] by PCR NEGATIVE NEGATIVE Final   Influenza B by PCR NEGATIVE NEGATIVE Final    Comment: (NOTE) The Xpert Xpress SARS-CoV-2/FLU/RSV plus assay is intended as an aid in the diagnosis of influenza from Nasopharyngeal swab specimens and should not be used as Buddie Marston sole basis for treatment. Nasal washings and aspirates are unacceptable for Xpert Xpress SARS-CoV-2/FLU/RSV testing.  Fact Sheet for Patients: EntrepreneurPulse.com.au  Fact Sheet for Healthcare Providers: IncredibleEmployment.be  This test is not yet approved or cleared by the [place] FDA and has been authorized for detection and/or diagnosis of SARS-CoV-2 by FDA under an Emergency Use Authorization (EUA). This EUA will remain in effect (meaning this test can be used) for the duration of the COVID-19 declaration under Section 564(b)(1) of the Act, 21 U.S.C. section 360bbb-3(b)(1), unless the authorization is terminated or revoked.  Performed at [facility], [place] [address], [place], [place] [address]   C Difficile Quick Screen w PCR reflex      Status: None   Collection Time: 01/25/21 12:13 PM   Specimen: STOOL  Result Value Ref Range Status   C Diff antigen NEGATIVE NEGATIVE Final   C Diff toxin NEGATIVE NEGATIVE Final   C Diff interpretation No C. difficile detected.  Final    Comment: Performed at [facility], [place] [address], [place], [place] [address]  Gastrointestinal Panel by PCR , Stool     Status:  None   Collection Time: 01/25/21 12:13 PM   Specimen: STOOL  Result Value Ref Range Status   Campylobacter species NOT DETECTED NOT DETECTED Final   Plesimonas shigelloides NOT DETECTED NOT DETECTED Final   Salmonella species NOT DETECTED NOT DETECTED Final   Yersinia enterocolitica NOT DETECTED NOT DETECTED Final   Vibrio species NOT DETECTED NOT DETECTED Final   Vibrio cholerae NOT DETECTED NOT DETECTED Final   Enteroaggregative E coli (EAEC) NOT DETECTED NOT DETECTED Final   Enteropathogenic E coli (EPEC) NOT DETECTED NOT DETECTED Final   Enterotoxigenic E coli (ETEC) NOT DETECTED NOT DETECTED Final   Shiga like toxin producing E coli (STEC) NOT DETECTED NOT DETECTED Final   Shigella/Enteroinvasive E coli (EIEC) NOT DETECTED NOT DETECTED Final   Cryptosporidium NOT DETECTED NOT DETECTED Final   Cyclospora cayetanensis NOT DETECTED NOT DETECTED Final   Entamoeba histolytica NOT DETECTED NOT DETECTED Final   Giardia lamblia NOT DETECTED NOT DETECTED Final   Adenovirus F40/41 NOT DETECTED NOT DETECTED Final   Astrovirus NOT DETECTED NOT DETECTED Final   Norovirus GI/GII NOT DETECTED NOT DETECTED Final   Rotavirus [place] NOT DETECTED NOT DETECTED Final   Sapovirus (I, II, IV, and V) NOT DETECTED NOT DETECTED Final    Comment: Performed at [facility], [address], [place], [place] [address]         Radiology Studies: No results found.      Scheduled Meds: . enoxaparin (LOVENOX) injection  40 mg Subcutaneous Q24H  . feeding supplement  1 Container Oral TID BM  . lip balm  1  application Topical BID  . methylPREDNISolone (SOLU-MEDROL) injection  60 mg Intravenous Daily  . nicotine  7 mg Transdermal Daily  . pantoprazole  40 mg Oral Daily  . venlafaxine XR  300 mg Oral Q breakfast   Continuous Infusions: . methocarbamol (ROBAXIN) IV    . ondansetron (ZOFRAN) IV       LOS: 5 days    Time spent: over 30 min    Fayrene Helper, MD [facility]   To contact the attending provider between 7A-7P or the covering provider during after hours 7P-7A, please log into the web site [email or web address] and access using universal [name] password for that web site. If you do not have the password, please call the hospital operator.  01/29/2021, 6:08 PM

## 2021-01-30 LAB — CBC WITH DIFFERENTIAL/PLATELET
Abs Immature Granulocytes: 0.05 10*3/uL (ref 0.00–0.07)
Basophils Absolute: 0 10*3/uL (ref 0.0–0.1)
Basophils Relative: 0 %
Eosinophils Absolute: 0.1 10*3/uL (ref 0.0–0.5)
Eosinophils Relative: 1 %
HCT: 38.9 % (ref 36.0–46.0)
Hemoglobin: 12.8 g/dL (ref 12.0–15.0)
Immature Granulocytes: 1 %
Lymphocytes Relative: 32 %
Lymphs Abs: 2.9 10*3/uL (ref 0.7–4.0)
MCH: 29.8 pg (ref 26.0–34.0)
MCHC: 32.9 g/dL (ref 30.0–36.0)
MCV: 90.7 fL (ref 80.0–100.0)
Monocytes Absolute: 0.6 10*3/uL (ref 0.1–1.0)
Monocytes Relative: 7 %
Neutro Abs: 5.4 10*3/uL (ref 1.7–7.7)
Neutrophils Relative %: 59 %
Platelets: 299 10*3/uL (ref 150–400)
RBC: 4.29 MIL/uL (ref 3.87–5.11)
RDW: 14.8 % (ref 11.5–15.5)
WBC: 9 10*3/uL (ref 4.0–10.5)
nRBC: 0 % (ref 0.0–0.2)

## 2021-01-30 LAB — COMPREHENSIVE METABOLIC PANEL
ALT: 23 U/L (ref 0–44)
AST: 22 U/L (ref 15–41)
Albumin: 2.4 g/dL — ABNORMAL LOW (ref 3.5–5.0)
Alkaline Phosphatase: 61 U/L (ref 38–126)
Anion gap: 5 (ref 5–15)
BUN: 7 mg/dL — ABNORMAL LOW (ref 8–23)
CO2: 30 mmol/L (ref 22–32)
Calcium: 8.5 mg/dL — ABNORMAL LOW (ref 8.9–10.3)
Chloride: 103 mmol/L (ref 98–111)
Creatinine, Ser: 0.67 mg/dL (ref 0.44–1.00)
GFR, Estimated: >60 mL/min (ref >60)
Glucose, Bld: 62 mg/dL — ABNORMAL LOW (ref 70–99)
Potassium: 3.9 mmol/L (ref 3.5–5.1)
Sodium: 138 mmol/L (ref 135–145)
Total Bilirubin: 0.7 mg/dL (ref 0.3–1.2)
Total Protein: 5.1 g/dL — ABNORMAL LOW (ref 6.5–8.1)

## 2021-01-30 LAB — GLUCOSE, CAPILLARY
Glucose-Capillary: 56 mg/dL — ABNORMAL LOW (ref 70–99)
Glucose-Capillary: 80 mg/dL (ref 70–99)
Glucose-Capillary: 79 mg/dL (ref 70–99)

## 2021-01-30 MED ORDER — PREDNISONE 10 MG PO TABS: ORAL_TABLET | ORAL | 0 refills | Status: DC

## 2021-01-30 MED ORDER — DEXTROSE 50 % IV SOLN
1.0000 | Freq: Once | INTRAVENOUS | Status: AC
  Administered 2021-01-30: 50 mL via INTRAVENOUS
  Filled 2021-01-30: qty 50

## 2021-01-30 MED ORDER — ONDANSETRON 4 MG PO TBDP: 4.0000 mg | ORAL_TABLET | Freq: Three times a day (TID) | ORAL | 0 refills | Status: DC | PRN

## 2021-01-30 NOTE — Discharge Summary (Signed)
[facility]  Physician Discharge Summary   Patient ID: Cynthia [name] MRN: [id number] DOB/AGE: May 04, 1961 60 y.o.  Admit date: 01/24/2021 Discharge date: 01/31/2021  PCP: Denita Lung, MD  DISCHARGE DIAGNOSES:  Crohn's disease with exacerbation COVID-19 infection Essential hypertension   RECOMMENDATIONS FOR OUTPATIENT FOLLOW UP: 1. Patient to follow-up with gastroenterology.  Due to insurance issues she can only be operated on by surgery group within [place].    Home Health: None Equipment/Devices: None  CODE STATUS: Full code  DISCHARGE CONDITION: fair  Diet recommendation: Pured diet  INITIAL HISTORY: 60 y.o.femalewith medical history significant forHTN,Crohn's disease. She was recently admitted to the hospital on March 8 and discharged on March 13 for Crohn's ileitis with intestinal obstruction. Patient states she had been doing well until yesterday. After eating she started developing nausea vomiting. Continued to have nausea and vomiting all day today and not able to tolerate anything to eat or drink. She reports having chronic diarrhea which is unchanged for her.She estimates vomiting maybe 20-25 times since yesterday. Patient is having waves of abdominal pain throughout her abdomen.Abdominal pain is severe and rated as 10/10 at worst. She does not know ofaggravating factors.She now feels like she is getting more bloated and her abdomen is distended. [facility] She was referred to a colorectal surgeon at [facility] due to insurance reasons.  She's been admitted with crohn's ileitis.  See below for additional details   Consultations:  General surgery  Gastroenterology  Procedures: Echo IMPRESSIONS  1. Left ventricular ejection fraction, by estimation, is 60 to 65%. The  left ventricle has normal function. The left ventricle has no regional  wall motion abnormalities. Left ventricular diastolic  parameters are  consistent with Grade I diastolic  dysfunction (impaired relaxation).  2. Right ventricular systolic function is normal. The right ventricular  size is normal.  3. The mitral valve is normal in structure. No evidence of mitral valve  regurgitation. No evidence of mitral stenosis.  4. The aortic valve is normal in structure. Aortic valve regurgitation is  not visualized. No aortic stenosis is present.  5. The inferior vena cava is normal in size with greater than 50%  respiratory variability, suggesting right atrial pressure of 3 mmHg.    HOSPITAL COURSE:    Crohn's ileitis, with intestinal obstruction Patient seen by gastroenterology subsequently by general surgery.  Initial thoughts were that patient may benefit from surgical resection.  However patient's insurance does not allow her to be operated on by surgery group here.  She will need to be seen by general surgery at [facility].  Patient in the meantime stabilized.  She was on steroids in the hospital.  She is tolerating a pured diet with no difficulty.  Cleared for discharge by the specialists.  Will be discharged on steroid taper.  Outpatient follow-up with the general surgery at [facility] as well as with her gastroenterologist.    COVID 19 virus infection Incidental finding.  Patient was asymptomatic.  She was given a 3-day course of Remdesivir.    Essential hypertension Continue home medications.  Grade 1 diastolic dysfunction noted on echocardiogram.  Tobacco use Nicotine patch provided to prevent withdrawal.Smoking cessation education to be provided before discharge  Hypoglycemia Likely due to poor oral intake.  Overall stable.  Okay for discharge home today.   PERTINENT LABS:  The results of significant diagnostics from this hospitalization (including imaging, microbiology, ancillary and laboratory) are listed below for reference.    Microbiology: Recent Results (from  the past 240 hour(s))  Resp  Panel by RT-PCR (Flu A&B, Covid) Nasopharyngeal Swab     Status: Abnormal   Collection Time: 01/24/21  5:34 PM   Specimen: Nasopharyngeal Swab; Nasopharyngeal(NP) swabs in vial transport medium  Result Value Ref Range Status   SARS Coronavirus 2 by RT PCR POSITIVE (A) NEGATIVE Final    Comment: RESULT CALLED TO, READ BACK BY AND VERIFIED WITH: RENITA, RN @ [phone] ON 01/25/21 C VARNER (NOTE) SARS-CoV-2 target nucleic acids are DETECTED.  The SARS-CoV-2 RNA is generally detectable in upper respiratory specimens during the acute phase of infection. Positive results are indicative of the presence of the identified virus, but do not rule out bacterial infection or co-infection with other pathogens not detected by the test. Clinical correlation with patient history and other diagnostic information is necessary to determine patient infection status. The expected result is Negative.  Fact Sheet for Patients: EntrepreneurPulse.com.au  Fact Sheet for Healthcare Providers: IncredibleEmployment.be  This test is not yet approved or cleared by the [place] FDA and  has been authorized for detection and/or diagnosis of SARS-CoV-2 by FDA under an Emergency Use Authorization (EUA).  This EUA will remain in effect (meaning this test ca n be used) for the duration of  the COVID-19 declaration under Section 564(b)(1) of the Act, 21 U.S.C. section 360bbb-3(b)(1), unless the authorization is terminated or revoked sooner.     Influenza A by PCR NEGATIVE NEGATIVE Final   Influenza B by PCR NEGATIVE NEGATIVE Final    Comment: (NOTE) The Xpert Xpress SARS-CoV-2/FLU/RSV plus assay is intended as an aid in the diagnosis of influenza from Nasopharyngeal swab specimens and should not be used as a sole basis for treatment. Nasal washings and aspirates are unacceptable for Xpert Xpress SARS-CoV-2/FLU/RSV testing.  Fact Sheet for  Patients: EntrepreneurPulse.com.au  Fact Sheet for Healthcare Providers: IncredibleEmployment.be  This test is not yet approved or cleared by the [place] FDA and has been authorized for detection and/or diagnosis of SARS-CoV-2 by FDA under an Emergency Use Authorization (EUA). This EUA will remain in effect (meaning this test can be used) for the duration of the COVID-19 declaration under Section 564(b)(1) of the Act, 21 U.S.C. section 360bbb-3(b)(1), unless the authorization is terminated or revoked.  Performed at [facility], [place] [address] [place], [place] [address]   C Difficile Quick Screen w PCR reflex     Status: None   Collection Time: 01/25/21 12:13 PM   Specimen: STOOL  Result Value Ref Range Status   C Diff antigen NEGATIVE NEGATIVE Final   C Diff toxin NEGATIVE NEGATIVE Final   C Diff interpretation No C. difficile detected.  Final    Comment: Performed at [facility], [place] [address], [place], [place] [address]  Gastrointestinal Panel by PCR , Stool     Status: None   Collection Time: 01/25/21 12:13 PM   Specimen: STOOL  Result Value Ref Range Status   Campylobacter species NOT DETECTED NOT DETECTED Final   Plesimonas shigelloides NOT DETECTED NOT DETECTED Final   Salmonella species NOT DETECTED NOT DETECTED Final   Yersinia enterocolitica NOT DETECTED NOT DETECTED Final   Vibrio species NOT DETECTED NOT DETECTED Final   Vibrio cholerae NOT DETECTED NOT DETECTED Final   Enteroaggregative E coli (EAEC) NOT DETECTED NOT DETECTED Final   Enteropathogenic E coli (EPEC) NOT DETECTED NOT DETECTED Final   Enterotoxigenic E coli (ETEC) NOT DETECTED NOT DETECTED Final   Shiga like toxin producing  E coli (STEC) NOT DETECTED NOT DETECTED Final   Shigella/Enteroinvasive E coli (EIEC) NOT DETECTED NOT DETECTED Final   Cryptosporidium NOT DETECTED NOT DETECTED Final   Cyclospora cayetanensis NOT  DETECTED NOT DETECTED Final   Entamoeba histolytica NOT DETECTED NOT DETECTED Final   Giardia lamblia NOT DETECTED NOT DETECTED Final   Adenovirus F40/41 NOT DETECTED NOT DETECTED Final   Astrovirus NOT DETECTED NOT DETECTED Final   Norovirus GI/GII NOT DETECTED NOT DETECTED Final   Rotavirus A NOT DETECTED NOT DETECTED Final   Sapovirus (I, II, IV, and V) NOT DETECTED NOT DETECTED Final    Comment: Performed at [facility], [address] [place], [place] [address]     Labs:  COVID-19 Labs   Lab Results  Component Value Date   [place] (A) 01/24/2021   [place] NEGATIVE 01/08/2021   [place] NEGATIVE 12/04/2020   [place] Not Detected 11/27/2020      Basic Metabolic Panel: Recent Labs  Lab 01/25/21 0543 01/26/21 0535 01/27/21 0358 01/27/21 1620 01/28/21 0408 01/28/21 1235 01/29/21 0938 01/30/21 0941  NA 143 137 139  --  137 138 135 138  K 3.1* 3.0* 4.3  --  5.6* 4.8 4.0 3.9  CL 109 109 108  --  103 105 102 103  CO2 23 23 24   --  29 26 27 30   GLUCOSE 103* 76 59* 151* 58* 123* 94 62*  BUN 17 10 8   --  7* 8 6* 7*  CREATININE 0.83 0.54 0.62  --  0.53 0.36* 0.56 0.67  CALCIUM 8.1* 7.8* 8.1*  --  8.3* 8.2* 8.5* 8.5*  MG 1.8 1.5* 1.8  --  1.8  --  2.0  --   PHOS 1.9* 1.4* 2.6  --  3.0  --  3.4  --    Liver Function Tests: Recent Labs  Lab 01/26/21 0535 01/27/21 0358 01/28/21 0408 01/29/21 0938 01/30/21 0941  AST 14* 16 15 24 22   ALT 18 18 19 21 23   ALKPHOS 50 59 59 62 61  BILITOT 0.7 0.7 0.6 0.5 0.7  PROT 4.5* 5.1* 5.0* 5.5* 5.1*  ALBUMIN 2.2* 2.5* 2.4* 2.6* 2.4*   Recent Labs  Lab 01/24/21 1935  LIPASE 19   CBC: Recent Labs  Lab 01/26/21 0535 01/27/21 0358 01/28/21 0408 01/29/21 0938 01/30/21 0941  WBC 8.1 7.8 7.4 9.9 9.0  NEUTROABS 5.1 4.1 3.9 5.5 5.4  HGB 10.4* 11.4* 11.7* 12.8 12.8  HCT 32.1* 36.2 37.1 39.7 38.9  MCV 92.0 93.5 93.2 91.7 90.7  PLT 246 283 259 320 299    CBG: Recent Labs  Lab  01/29/21 1951 01/29/21 2340 01/30/21 0428 01/30/21 0623 01/30/21 1236  GLUCAP 148* 100* 56* 80 79     IMAGING STUDIES CT Abdomen Pelvis W Contrast  Result Date: 01/08/2021 CLINICAL DATA:  Periumbilical abdominal pain. History of Crohn's disease. EXAM: CT ABDOMEN AND PELVIS WITH CONTRAST TECHNIQUE: Multidetector CT imaging of the abdomen and pelvis was performed using the standard protocol following bolus administration of intravenous contrast. CONTRAST:  15m OMNIPAQUE IOHEXOL 300 MG/ML  SOLN COMPARISON:  12/04/2020 FINDINGS: Lower chest: The lung bases are clear of acute process. No pleural effusion or pulmonary lesions. The heart is normal in size. No pericardial effusion. The distal esophagus and aorta are unremarkable. Hepatobiliary: No hepatic lesions or intrahepatic biliary dilatation. No common bile duct dilatation. Pancreas: Moderate pancreatic atrophy but no mass or inflammation. Spleen: Normal size.  No focal lesions. Adrenals/Urinary Tract: The adrenal glands  and kidneys are unremarkable. The bladder is unremarkable. Stomach/Bowel: The stomach is unremarkable. The duodenum is unremarkable. High-grade small-bowel obstruction with markedly dilated fluid-filled loops of small bowel with air-fluid levels extending down into the pelvis. Tight stricture or adhesive process in the mid to lower central pelvis as demonstrated on the prior CT scan likely related to the patient's Crohn's disease. 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 Some associated mucosal enhancement could be local inflammatory process or possible active Crohn's disease. No definite abscess. Second area of probable adhesive change the mid small bowel with definite caliber change centrally at the level of the iliac crest. The colon is relatively decompressed. Vascular/Lymphatic: The aorta is normal in caliber. No dissection. The branch vessels are patent. The  major venous structures are patent. No mesenteric or retroperitoneal mass or adenopathy. Small scattered lymph nodes are noted. Reproductive: Surgically absent. Other: No free air or free fluid collections to suggest perforation. Musculoskeletal: No significant bony findings. IMPRESSION: 1. High-grade distal small-bowel obstruction likely due to stricture or adhesive process in the mid to lower central pelvis as demonstrated on the prior CT scan. 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 Second area of probable adhesive change in the mid small bowel with definite caliber change centrally at the level of the iliac crest. 2. No free air or free fluid collections to suggest perforation. Electronically Signed   By: Marijo Sanes M.D.   On: 01/08/2021 09:15   DG Abd Acute W/Chest  Result Date: 01/24/2021 CLINICAL DATA:  Abdominal pain, vomiting.  History of obstruction EXAM: DG ABDOMEN ACUTE WITH 1 VIEW CHEST COMPARISON:  01/10/2021 FINDINGS: Markedly dilated small bowel loops with air-fluid level compatible with high-grade small bowel obstruction. Small bowel loops measure up to 8 cm in diameter. No free air organomegaly. Lungs clear. Heart is normal size. No effusions. No acute bony abnormality. IMPRESSION: Markedly dilated small bowel loops with air-fluid levels compatible with high-grade small bowel obstruction. Electronically Signed   By: Rolm Baptise M.D.   On: 01/24/2021 17:17   DG Abd Portable 1V-Small Bowel Obstruction Protocol-initial, 8 hr delay  Result Date: 01/25/2021 CLINICAL DATA:  Small bowel obstruction. EXAM: PORTABLE ABDOMEN - 1 VIEW COMPARISON:  January 24, 2021. FINDINGS: Severe small bowel dilatation is again noted concerning for distal small bowel obstruction. Residual contrast is noted in the nondilated colon. IMPRESSION: Severe small bowel dilatation is again noted concerning for distal small bowel obstruction. Electronically Signed   By: Marijo Conception M.D.   On: 01/25/2021 08:22   DG Abd Portable 1V-Small Bowel Protocol-Position Verification  Result Date: 01/24/2021 CLINICAL DATA:  Enteric catheter placement EXAM: PORTABLE ABDOMEN - 1 VIEW COMPARISON:  01/24/2021 FINDINGS: Single frontal view of the lower chest and upper abdomen demonstrates enteric catheter passing below diaphragm tip and side port projecting over the gastric antrum. Marked gaseous distention of the small bowel with multiple gas fluid levels again noted unchanged. The lung bases are clear. IMPRESSION: 1. Enteric catheter tip and side port projecting over the gastric antrum. 2. Persistent small bowel obstruction. Electronically Signed   By: Randa Ngo M.D.   On: 01/24/2021 21:18   DG Abd Portable 1V  Result Date: 01/10/2021 CLINICAL DATA:  Recurrent small-bowel obstruction with nausea and vomiting EXAM: PORTABLE ABDOMEN - 1 VIEW COMPARISON:  01/08/2021 FINDINGS: NG tube in the body of the  stomach unchanged. Multiple dilated small bowel loops unchanged. Colon decompressed. Gas in the rectum. IMPRESSION: Dilated small bowel loops compatible with small bowel obstruction. NG tube in the stomach. No change from 2 days prior Electronically Signed   By: Franchot Gallo M.D.   On: 01/10/2021 11:12   DG Abd Portable 1V-Small Bowel Obstruction Protocol-initial, 8 hr delay  Result Date: 01/08/2021 CLINICAL DATA:  Small-bowel obstruction EXAM: PORTABLE ABDOMEN - 1 VIEW COMPARISON:  Plain film from earlier in the same day. FINDINGS: Scattered large and small bowel gas is noted. Persistent small bowel obstructive changes are seen. Contrast material is been administered and passed into the proximal small bowel. Some contrast is noted within the more distal small bowel as well although no definitive colonic contrast is noted at this time. Continued follow-up is recommended. Contrast from prior CT is noted. IMPRESSION: Persistent small-bowel obstructive change. Contrast is noted within the small  bowel although no definitive colonic contrast is seen at this time. Electronically Signed   By: Inez Catalina M.D.   On: 01/08/2021 21:22   DG Abd Portable 1V-Small Bowel Protocol-Position Verification  Result Date: 01/08/2021 CLINICAL DATA:  Nasogastric tube placement. Crohn's disease. Abdominal pain and nausea EXAM: PORTABLE ABDOMEN - 1 VIEW COMPARISON:  CT abdomen and pelvis January 08, 2021; abdominal radiograph December 04, 2020 FINDINGS: Nasogastric tube tip is at the level of the proximal gastric cardia. Side port is above the gastroesophageal junction. Multiple loops of dilated bowel again noted. No free air evident on supine examination. Lung bases clear. IMPRESSION: Nasogastric tube tip in proximal gastric cardia. Side port above the gastroesophageal junction. Advise advancing nasogastric tube approximately 8-10 cm. Dilated bowel remains. No free air evident on supine examination. Electronically Signed   By: Lowella Grip III M.D.   On: 01/08/2021 11:18   ECHOCARDIOGRAM LIMITED  Result Date: 01/25/2021    ECHOCARDIOGRAM LIMITED REPORT   Patient Name:   TAVIONNA GROUT [facility] Date of Exam: 01/25/2021 Medical Rec #:  [id number]         Height:       63.0 in Accession #:    [id number]        Weight:       132.3 lb Date of Birth:  03-02-1961        BSA:          1.622 m Patient Age:    32 years          BP:           140/88 mmHg Patient Gender: F                 HR:           86 bpm. Exam Location:  Inpatient Procedure: 2D Echo, Cardiac Doppler and Color Doppler Indications:    Edema  History:        Patient has no prior history of Echocardiogram examinations.                 Edema, Signs/Symptoms:Abdominal pain, Crohns; Risk                 Factors:Current Smoker and Hypertension.  Sonographer:    Dustin Flock Referring Phys: [phone] A CALDWELL POWELL JR  Sonographer Comments: COVID+ IMPRESSIONS  1. Left ventricular ejection fraction, by estimation, is 60 to 65%. The left ventricle has normal function.  The left ventricle has no regional wall motion abnormalities. Left ventricular diastolic parameters are consistent with Grade I diastolic dysfunction (  impaired relaxation).  2. Right ventricular systolic function is normal. The right ventricular size is normal.  3. The mitral valve is normal in structure. No evidence of mitral valve regurgitation. No evidence of mitral stenosis.  4. The aortic valve is normal in structure. Aortic valve regurgitation is not visualized. No aortic stenosis is present.  5. The inferior vena cava is normal in size with greater than 50% respiratory variability, suggesting right atrial pressure of 3 mmHg. FINDINGS  Left Ventricle: Left ventricular ejection fraction, by estimation, is 60 to 65%. The left ventricle has normal function. The left ventricle has no regional wall motion abnormalities. The left ventricular internal cavity size was normal in size. There is  no left ventricular hypertrophy. Left ventricular diastolic parameters are consistent with Grade I diastolic dysfunction (impaired relaxation). Normal left ventricular filling pressure. Right Ventricle: The right ventricular size is normal. No increase in right ventricular wall thickness. Right ventricular systolic function is normal. Left Atrium: Left atrial size was normal in size. Right Atrium: Right atrial size was normal in size. Pericardium: There is no evidence of pericardial effusion. Mitral Valve: The mitral valve is normal in structure. No evidence of mitral valve stenosis. Tricuspid Valve: The tricuspid valve is normal in structure. Tricuspid valve regurgitation is not demonstrated. No evidence of tricuspid stenosis. Aortic Valve: The aortic valve is normal in structure. Aortic valve regurgitation is not visualized. No aortic stenosis is present. Pulmonic Valve: The pulmonic valve was normal in structure. Pulmonic valve regurgitation is not visualized. No evidence of pulmonic stenosis. Aorta: The aortic root is normal  in size and structure. Venous: The inferior vena cava is normal in size with greater than 50% respiratory variability, suggesting right atrial pressure of 3 mmHg. IAS/Shunts: No atrial level shunt detected by color flow Doppler. LEFT VENTRICLE PLAX 2D LVIDd:         5.10 cm  Diastology LVIDs:         2.90 cm  LV e' medial:    7.51 cm/s LV PW:         1.00 cm  LV E/e' medial:  9.9 LV IVS:        1.00 cm  LV e' lateral:   11.10 cm/s LVOT diam:     2.00 cm  LV E/e' lateral: 6.7 LVOT Area:     3.14 cm  RIGHT VENTRICLE RV S prime:     14.70 cm/s LEFT ATRIUM         Index LA diam:    3.70 cm 2.28 cm/m   AORTA Ao Root diam: 2.50 cm MITRAL VALVE MV Area (PHT): 4.80 cm     SHUNTS MV Decel Time: 158 msec     Systemic Diam: 2.00 cm MV E velocity: 74.10 cm/s MV A velocity: 108.00 cm/s MV E/A ratio:  0.69 Mihai Croitoru MD Electronically signed by Sanda Klein MD Signature Date/Time: 01/25/2021/4:23:40 PM    Final     DISCHARGE EXAMINATION: Vitals:   01/29/21 0451 01/29/21 1207 01/29/21 1956 01/30/21 0432  BP: (!) 149/89 (!) 141/105 (!) 141/104 (!) 147/109  Pulse: 72 90 88 91  Resp: 18 14 16 19   Temp: 97.6 F (36.4 C) 98.2 F (36.8 C) 98.3 F (36.8 C) (!) 97.5 F (36.4 C)  TempSrc: Oral Oral Oral Oral  SpO2: 100% 99% 100% 100%  Weight:      Height:       General appearance: Awake alert.  In no distress Resp: Clear to auscultation bilaterally.  Normal effort  Cardio: S1-S2 is normal regular.  No S3-S4.  No rubs murmurs or bruit GI: Abdomen is soft.  Nontender nondistended.  Bowel sounds are present normal.  No masses organomegaly    DISPOSITION: Home  Discharge Instructions    Call MD for:  difficulty breathing, headache or visual disturbances   Complete by: As directed    Call MD for:  extreme fatigue   Complete by: As directed    Call MD for:  persistant dizziness or light-headedness   Complete by: As directed    Call MD for:  persistant nausea and vomiting   Complete by: As directed     Call MD for:  severe uncontrolled pain   Complete by: As directed    Call MD for:  temperature >100.4   Complete by: As directed    Discharge instructions   Complete by: As directed    Please take your medications as prescribed.  Follow-up with the surgeon at [facility] for further management of Crohn's disease.  Please eat only pured diet for now.  Seek attention immediately if your symptoms recur.  You were cared for by a hospitalist during your hospital stay. If you have any questions about your discharge medications or the care you received while you were in the hospital after you are discharged, you can call the unit and asked to speak with the hospitalist on call if the hospitalist that took care of you is not available. Once you are discharged, your primary care physician will handle any further medical issues. Please note that NO REFILLS for any discharge medications will be authorized once you are discharged, as it is imperative that you return to your primary care physician (or establish a relationship with a primary care physician if you do not have one) for your aftercare needs so that they can reassess your need for medications and monitor your lab values. If you do not have a primary care physician, you can call [phone] for a physician referral.   Increase activity slowly   Complete by: As directed         Allergies as of 01/30/2021      Reactions   Iodine    REACTION: in eye gtts Iodine eye drops; pt has no reactions to CT contrast      Medication List    STOP taking these medications   hyoscyamine 0.125 MG tablet Commonly known as: LEVSIN     TAKE these medications   diphenhydramine-acetaminophen 25-500 MG Tabs tablet Commonly known as: TYLENOL PM Take 2 tablets by mouth at bedtime as needed (sleep/pain).   esomeprazole 40 MG capsule Commonly known as: NEXIUM TAKE ONE (1) CAPSULE BY MOUTH 2 TIMES DAILY What changed:   how much to take  how to take  this  when to take this  additional instructions   losartan-hydrochlorothiazide 100-12.5 MG tablet Commonly known as: HYZAAR Take 1 tablet by mouth daily.   ondansetron 4 MG disintegrating tablet Commonly known as: ZOFRAN-ODT Take 1 tablet (4 mg total) by mouth every 8 (eight) hours as needed for nausea or vomiting.   phenylephrine 1 % nasal spray Commonly known as: NEO-SYNEPHRINE Place 1 drop into both nostrils every 6 (six) hours as needed for congestion.   polycarbophil 625 MG tablet Commonly known as: FIBERCON Take 1 tablet (625 mg total) by mouth 2 (two) times daily.   pramipexole 1 MG tablet Commonly known as: MIRAPEX Take 1 tablet by mouth daily What changed:   how much to  take  how to take this  when to take this  additional instructions   predniSONE 10 MG tablet Commonly known as: DELTASONE Take 20 mg (2 tabs of 63m each) twice daily for 2 weeks. Decrease prednisone dose by 10 mg every 2 weeks. What changed: additional instructions   venlafaxine XR 150 MG 24 hr capsule Commonly known as: EFFEXOR-XR Take 300 mg by mouth daily with breakfast. What changed: Another medication with the same name was removed. Continue taking this medication, and follow the directions you see here.         Follow-up Information    LDenita Lung MD. Schedule an appointment as soon as possible for a visit in 1 week(s).   Specialty: Family Medicine Contact information: [phone]        GWillia Craze NP Follow up on 02/01/2021.   Specialty: Gastroenterology Why: 2:00 pm Contact information: [phone]              TOTAL DISCHARGE TIME: 342minutes  [facility] Pager on [email or web address]  01/31/2021, 12:00 PM

## 2021-01-30 NOTE — Discharge Instructions (Signed)
Crohn's Disease  Crohn's disease is a long-lasting (chronic) disease that affects the gastrointestinal (GI) tract. Crohn's disease often causes irritation and inflammation in the small intestine and the beginning of the large intestine, but it can affect any part of the GI tract. Crohn's disease is part of a group of illnesses that are known as inflammatory bowel disease (IBD). Crohn's disease may start slowly and get worse over time. Symptoms may come and go. They may also go away for months or even years at a time (remission). What are the causes? The exact cause of this condition is not known. It may involve a response that causes your body's disease-fighting (immune) system to attack healthy cells and tissues (autoimmune response). Bacteria, genes, and your environment may also play a role. What increases the risk? The following factors may make you more likely to develop this condition:  Having a family member who has Crohn's disease, another IBD, or an autoimmune condition.  Using products that contain nicotine or tobacco, such as cigarettes and e-cigarettes.  Being in your 36s.  Having [place] European ancestry. What are the signs or symptoms? The main symptoms of this condition involve your GI tract. These include:  Diarrhea.  Pain or cramping in the abdomen commonly felt in the lower right side of the abdomen.  Frequent watery or bloody stools.  Constipation. This may mean having: ? Fewer bowel movements in a week than normal. ? Difficulty having a bowel movement. ? Stools that are dry, hard, or larger than normal.  Rectal bleeding.  Rectal pain.  An urgent need to have a bowel movement.  The feeling that you are not finished having a bowel movement. Other symptoms may include:  Unexplained weight loss.  Fatigue.  Fever.  Nausea or appetite loss.  Joint pain.  Vision changes.  Red bumps or sores on the skin.  Sores inside the mouth. How is this  diagnosed? This condition may be diagnosed based on:  Your symptoms and medical history.  A physical exam.  Tests, which may include: ? Blood tests. ? Stool sample tests. ? Imaging tests, such as X-rays and CT scans. ? Tests to examine the inside of your intestines using a long, flexible tube that has a light and a camera on the end (colonoscopy). ? A procedure to remove tissue samples from inside your bowel for testing (biopsy). You may need to work with a health care provider who specializes in diseases of the digestive tract (gastroenterologist). How is this treated? There is no cure for this condition, and it affects each person differently. Treatment can help you manage your symptoms. Your treatment may include:  Resting your bowels. This involves having a period of healing time when your bowels are not passing stools. This may be done by: ? Drinking only clear liquids. These are liquids that you can see through, such as water, black coffee, fruit juice without pulp, broth, gelatin, and ice pops. ? Getting nutrition through an IV for a period of time.  Medicines. These may be used by themselves or with other treatments (combination therapy). You may be given medicines that help to: ? Reduce inflammation. ? Control your immune system activity. ? Fight infections. ? Relieve cramps and prevent diarrhea. ? Control your pain.  Surgery. You may need surgery if: ? Medicines and other treatments are not working anymore. ? You develop complications from severe Crohn's disease. ? A section of your intestine becomes so damaged that it needs to be removed. Follow these  instructions at home: Medicines  Take over-the-counter and prescription medicines only as told by your health care provider.  If you were prescribed an antibiotic, take it as told by your health care provider. Do not stop taking the antibiotic even if you start to feel better.  Avoid taking ibuprofen or other NSAID  medicines if possible, these can make Crohn's disease worse. Eating and drinking  Talk with your health care provider or a diet and nutrition specialist (registered dietitian) about what diet is best for you.  Drink enough fluid to keep your urine pale yellow.  If you are taking steroids to reduce inflammation, get plenty of calcium in your diet to help keep your bones healthy. You may also consider taking a calcium supplement with vitamin D.  Keep a food diary to identify foods that make your symptoms better or worse, and avoid foods that cause symptoms.  Follow instructions from your health care provider about eating or drinking restrictions if you have worsening symptoms (flare-up).  Limit alcohol intake to no more than 1 drink a day for nonpregnant women and 2 drinks a day for men. One drink equals 12 oz of beer, 5 oz of wine, or 1 oz of hard liquor. General instructions  Make sure you get all the vaccines that your health care provider recommends, especially pneumonia (pneumococcal) and flu (influenza) vaccines.  Do not use any products that contain nicotine or tobacco, such as cigarettes and e-cigarettes. If you need help quitting, ask your health care provider.  Exercise every day, or as often told by your health care provider.  Keep all follow-up visits as told by your health care provider. This is important. Contact a health care provider if:  You have diarrhea, cramps in your abdomen, and other GI problems that are present almost all the time.  Your symptoms do not improve with treatment.  You continue to lose weight.  You develop a rash or sores on your skin.  You develop eye problems.  You have a fever.  Your symptoms get worse or you develop new symptoms. Get help right away if:  You have bloody diarrhea.  You have severe pain in your abdomen.  You cannot pass stools. Summary  Crohn's disease affects each person differently. The cause of this condition is  not known, but it may involve a response that causes your body's immune system to attack healthy cells and tissues.  There are multiple treatment options that can help you manage the condition. Talk with your health care provider or diet and nutrition specialist (registered dietitian) about what diet is best for you.  Make sure you get all the vaccines that your health care provider recommends, especially pneumonia (pneumococcal) and flu (influenza) vaccines. This information is not intended to replace advice given to you by your health care provider. Make sure you discuss any questions you have with your health care provider. Document Revised: 06/21/2020 Document Reviewed: 06/21/2020 Elsevier Patient Education  2021 [employer].

## 2021-01-30 NOTE — Progress Notes (Addendum)
   01/30/21 0432  Provider Notification  Provider Name/Title B. Chotiner  Date Provider Notified 01/30/21  Time Provider Notified [phone]  Notification Type Page  Notification Reason Other (Comment) (low BS (56))  Test performed and critical result BS  Date Critical Result Received 01/30/21  Time Critical Result Received 0431  Provider response See new orders  Date of Provider Response 01/30/21  Time of Provider Response 0441   BS rechecked is 80

## 2021-01-31 LAB — GLUCOSE, CAPILLARY: Glucose-Capillary: 570 mg/dL (ref 70–99)

## 2021-02-01 ENCOUNTER — Other Ambulatory Visit: Payer: Self-pay

## 2021-02-01 ENCOUNTER — Telehealth: Payer: Self-pay | Admitting: *Deleted

## 2021-02-01 ENCOUNTER — Ambulatory Visit: Payer: 59 | Admitting: Nurse Practitioner

## 2021-02-01 ENCOUNTER — Telehealth: Payer: Self-pay | Admitting: Family Medicine

## 2021-02-01 ENCOUNTER — Ambulatory Visit (INDEPENDENT_AMBULATORY_CARE_PROVIDER_SITE_OTHER): Payer: 59 | Admitting: Family Medicine

## 2021-02-01 VITALS — BP 128/90 | HR 103 | Temp 96.7°F | Wt 133.6 lb

## 2021-02-01 DIAGNOSIS — K50919 Crohn's disease, unspecified, with unspecified complications: Secondary | ICD-10-CM | POA: Diagnosis not present

## 2021-02-01 DIAGNOSIS — Z8616 Personal history of COVID-19: Secondary | ICD-10-CM

## 2021-02-01 NOTE — Progress Notes (Signed)
   Subjective:    Patient ID: Cynthia [name], female    DOB: November 04, 1960, 60 y.o.   MRN: [id number]  HPI She is here for a follow-up visit.  She was recently in the hospital for treatment of Crohn's disease and subsequently also found to have COVID.  This is in spite of her getting regular vaccines for the Covid due to her use of Stelara.  She was recently discharged from the hospital.  Complications in the hospital were mainly revolving around insurance as the local surgical group does not take her insurance.  She will need to be seen by someone that except the [facility].  Presently she continues on prednisone 20 mg twice daily as well as as needed Zofran.  Review of record indicates she has lost approximately 7 pounds within the last couple of months.  She is scheduled to see Dr. Hilliard Clark in [facility] for further evaluation for possible surgery for her Crohn's disease.  Presently she is mainly complaining of abdominal cramping and is on a liquid diet.  She states that her bowel habits are adequate.   Review of Systems     Objective:   Physical Exam Alert and in no distress.  Cardiac exam shows regular rhythm without murmurs or gallops.  Abdominal exam shows active bowel sounds without masses or tenderness The last several hospital admission and discharge summary is was reviewed in detail.  I went over these with her today.      Assessment & Plan:  Crohn's disease with complication, unspecified gastrointestinal tract location [facility])  History of COVID-19 While here a phone call was made for her to be seen by [facility] and she is scheduled to see Dr. Hilliard Clark She will continue on her prednisone.  She was instructed to go to the [facility] emergency room if she has worsening symptoms between now and Monday.

## 2021-02-01 NOTE — Telephone Encounter (Signed)
Per Dr. Lanice Shirts instructions [facility] was called to schedule an appt ASAP.  Pt was schedule for Monday 02/04/2021 arrival at 8:45 for a 9:00 am with Dr. Quentin Cornwall. Per PA at facility was also advised if patient had a emergency over the weekend to please go to a [facility], if possible.  The closest one is in [facility]. Dr. Redmond School and patient was verbally advised. Information was also written and handed to pt. Pt advised she understood al information and that she was very familiar with the [facility] in [facility] as her grand-daughter was born at facility

## 2021-02-01 NOTE — Telephone Encounter (Signed)
Called pt to reschedule her appointment and no answer and her voice mail is full

## 2021-02-19 HISTORY — PX: COLECTOMY WITH COLOSTOMY CREATION/HARTMANN PROCEDURE: SHX6598

## 2021-02-19 HISTORY — PX: ILEOCECETOMY: SHX5857

## 2021-02-19 HISTORY — PX: ILEOSTOMY: SHX1783

## 2021-02-19 HISTORY — PX: OTHER SURGICAL HISTORY: SHX169

## 2021-03-05 ENCOUNTER — Ambulatory Visit (INDEPENDENT_AMBULATORY_CARE_PROVIDER_SITE_OTHER): Payer: 59 | Admitting: Family Medicine

## 2021-03-05 ENCOUNTER — Other Ambulatory Visit: Payer: Self-pay

## 2021-03-05 ENCOUNTER — Encounter: Payer: Self-pay | Admitting: Family Medicine

## 2021-03-05 VITALS — BP 120/80 | HR 117 | Temp 98.2°F | Wt 113.6 lb

## 2021-03-05 DIAGNOSIS — F341 Dysthymic disorder: Secondary | ICD-10-CM | POA: Diagnosis not present

## 2021-03-05 DIAGNOSIS — K50919 Crohn's disease, unspecified, with unspecified complications: Secondary | ICD-10-CM | POA: Diagnosis not present

## 2021-03-05 DIAGNOSIS — K219 Gastro-esophageal reflux disease without esophagitis: Secondary | ICD-10-CM | POA: Diagnosis not present

## 2021-03-05 DIAGNOSIS — G2581 Restless legs syndrome: Secondary | ICD-10-CM

## 2021-03-05 MED ORDER — PRAMIPEXOLE DIHYDROCHLORIDE 1 MG PO TABS
1.0000 mg | ORAL_TABLET | Freq: Every day | ORAL | 3 refills | Status: DC
Start: 2021-03-05 — End: 2021-09-25

## 2021-03-05 MED ORDER — VENLAFAXINE HCL ER 150 MG PO CP24: 300.0000 mg | ORAL_CAPSULE | Freq: Every day | ORAL | 3 refills | Status: DC

## 2021-03-05 MED ORDER — ESOMEPRAZOLE MAGNESIUM 40 MG PO CPDR: DELAYED_RELEASE_CAPSULE | ORAL | 3 refills | Status: DC

## 2021-03-05 NOTE — Progress Notes (Signed)
   Subjective:    Patient ID: Cynthia [name], female    DOB: [birth year], 60 y.o.   MRN: [id number]  HPI She is here for follow-up visit after recent hospitalization due to Crohn's colitis requiring partial resection of colon and ileocolectomy.  She now has a 2 pouches, 1 of which is draining very little.  She still has staples and placed and is planning to go back on May 5 for removal of them.  Her main complaint today is back pain made worse with any motion.  No nausea, vomiting.  She is eating without difficulty.  She is taking oxycodone very rarely and does take 2 extra 3 Tylenol daily.  Continues on prednisone 10 mg/day.  She has stopped her other medications.  Her sleeping habits have been disrupted and she does have RLS with response to Mirapex.  She also has been off of her Effexor.  She recognizes that she is not in a good psychological state and recognizes that some of her thought processes are delusional in nature.  This has her quite concerned.   Review of Systems     Objective:   Physical Exam Alert and in no distress otherwise not examined.  The medical record from [facility] was evaluated.       Assessment & Plan:  Crohn's disease with complication, unspecified gastrointestinal tract location ([place]) - Plan: CBC with Differential/Platelet, Comprehensive metabolic panel  RLS (restless legs syndrome) - Plan: pramipexole (MIRAPEX) 1 MG tablet  Gastroesophageal reflux disease - Plan: esomeprazole (NEXIUM) 40 MG capsule  Dysthymia - Plan: venlafaxine XR (EFFEXOR-XR) 150 MG 24 hr capsule I explained that her pain needs to be handled with more aggressive treatment with 2 extra strength Tylenol 3 times per day as well as using the oxycodone for pain relief.  I will place her back on Mirapex which should help with her restless leg and also sleeping.  We will place her back on Nexium since she is also on steroids.  She will eventually be switched over to a DMARD handled by Dr.  Fuller Plan.  I explained that she has multiple reasons for her psychological behavior.  Hopefully placing her back on Mirapex as well as Effexor and better pain control will help with the symptoms. I will have her follow-up with me in several months or sooner if needed.

## 2021-03-05 NOTE — Patient Instructions (Signed)
You can take 2 of the extra strength Tylenol 3 times per day for the pain and use the oxycodone if you need more relief

## 2021-03-06 ENCOUNTER — Telehealth: Payer: Self-pay

## 2021-03-06 LAB — COMPREHENSIVE METABOLIC PANEL
ALT: 22 IU/L (ref 0–32)
AST: 12 IU/L (ref 0–40)
Albumin/Globulin Ratio: 1.5 (ref 1.2–2.2)
Albumin: 4.1 g/dL (ref 3.8–4.8)
Alkaline Phosphatase: 75 IU/L (ref 44–121)
BUN/Creatinine Ratio: 28 (ref 12–28)
BUN: 26 mg/dL (ref 8–27)
Bilirubin Total: 0.2 mg/dL (ref 0.0–1.2)
CO2: 20 mmol/L (ref 20–29)
Calcium: 10.3 mg/dL (ref 8.7–10.3)
Chloride: 99 mmol/L (ref 96–106)
Creatinine, Ser: 0.94 mg/dL (ref 0.57–1.00)
Globulin, Total: 2.8 g/dL (ref 1.5–4.5)
Glucose: 88 mg/dL (ref 65–99)
Potassium: 5.7 mmol/L — ABNORMAL HIGH (ref 3.5–5.2)
Sodium: 136 mmol/L (ref 134–144)
Total Protein: 6.9 g/dL (ref 6.0–8.5)
eGFR: 69 mL/min/{1.73_m2} (ref >59)

## 2021-03-06 LAB — CBC WITH DIFFERENTIAL/PLATELET
Basophils Absolute: 0.1 10*3/uL (ref 0.0–0.2)
Basos: 0 %
EOS (ABSOLUTE): 0.4 10*3/uL (ref 0.0–0.4)
Eos: 2 %
Hematocrit: 34.1 % (ref 34.0–46.6)
Hemoglobin: 11.4 g/dL (ref 11.1–15.9)
Immature Grans (Abs): 0.3 10*3/uL — ABNORMAL HIGH (ref 0.0–0.1)
Immature Granulocytes: 2 %
Lymphocytes Absolute: 5.5 10*3/uL — ABNORMAL HIGH (ref 0.7–3.1)
Lymphs: 28 %
MCH: 30.8 pg (ref 26.6–33.0)
MCHC: 33.4 g/dL (ref 31.5–35.7)
MCV: 92 fL (ref 79–97)
Monocytes Absolute: 0.9 10*3/uL (ref 0.1–0.9)
Monocytes: 5 %
Neutrophils Absolute: 12.5 10*3/uL — ABNORMAL HIGH (ref 1.4–7.0)
Neutrophils: 63 %
Platelets: 735 10*3/uL — ABNORMAL HIGH (ref 150–450)
RBC: 3.7 x10E6/uL — ABNORMAL LOW (ref 3.77–5.28)
RDW: 12.9 % (ref 11.7–15.4)
WBC: 19.8 10*3/uL — ABNORMAL HIGH (ref 3.4–10.8)

## 2021-03-06 NOTE — Telephone Encounter (Signed)
Pt sister is requesting to have home health to come out and help with skilled nursing. Please advise if this is ok.   Blima Singer RMA

## 2021-03-06 NOTE — Telephone Encounter (Signed)
ok 

## 2021-03-07 ENCOUNTER — Other Ambulatory Visit: Payer: Self-pay

## 2021-03-07 DIAGNOSIS — Z79899 Other long term (current) drug therapy: Secondary | ICD-10-CM

## 2021-03-07 DIAGNOSIS — K50919 Crohn's disease, unspecified, with unspecified complications: Secondary | ICD-10-CM

## 2021-03-07 NOTE — Telephone Encounter (Signed)
Order put in to [facility] [place]

## 2021-03-12 ENCOUNTER — Emergency Department (HOSPITAL_COMMUNITY)
Admission: EM | Admit: 2021-03-12 | Discharge: 2021-03-12 | Disposition: A | Discharge status: Home / Self Care | Payer: 59 | Attending: Emergency Medicine | Admitting: Emergency Medicine

## 2021-03-12 ENCOUNTER — Emergency Department (HOSPITAL_COMMUNITY): Payer: 59

## 2021-03-12 ENCOUNTER — Other Ambulatory Visit: Payer: Self-pay

## 2021-03-12 ENCOUNTER — Encounter (HOSPITAL_COMMUNITY): Payer: Self-pay | Admitting: Emergency Medicine

## 2021-03-12 DIAGNOSIS — Z8616 Personal history of COVID-19: Secondary | ICD-10-CM | POA: Diagnosis not present

## 2021-03-12 DIAGNOSIS — R410 Disorientation, unspecified: Secondary | ICD-10-CM

## 2021-03-12 DIAGNOSIS — D72829 Elevated white blood cell count, unspecified: Secondary | ICD-10-CM | POA: Insufficient documentation

## 2021-03-12 DIAGNOSIS — F17210 Nicotine dependence, cigarettes, uncomplicated: Secondary | ICD-10-CM | POA: Diagnosis not present

## 2021-03-12 DIAGNOSIS — Z79899 Other long term (current) drug therapy: Secondary | ICD-10-CM | POA: Diagnosis not present

## 2021-03-12 DIAGNOSIS — I10 Essential (primary) hypertension: Secondary | ICD-10-CM | POA: Insufficient documentation

## 2021-03-12 DIAGNOSIS — R4182 Altered mental status, unspecified: Secondary | ICD-10-CM | POA: Diagnosis present

## 2021-03-12 LAB — CBC WITH DIFFERENTIAL/PLATELET
Abs Immature Granulocytes: 0.13 10*3/uL — ABNORMAL HIGH (ref 0.00–0.07)
Basophils Absolute: 0.1 10*3/uL (ref 0.0–0.1)
Basophils Relative: 1 %
Eosinophils Absolute: 0.2 10*3/uL (ref 0.0–0.5)
Eosinophils Relative: 2 %
HCT: 40.7 % (ref 36.0–46.0)
Hemoglobin: 13 g/dL (ref 12.0–15.0)
Immature Granulocytes: 1 %
Lymphocytes Relative: 33 %
Lymphs Abs: 4 10*3/uL (ref 0.7–4.0)
MCH: 31.1 pg (ref 26.0–34.0)
MCHC: 31.9 g/dL (ref 30.0–36.0)
MCV: 97.4 fL (ref 80.0–100.0)
Monocytes Absolute: 0.7 10*3/uL (ref 0.1–1.0)
Monocytes Relative: 6 %
Neutro Abs: 7.2 10*3/uL (ref 1.7–7.7)
Neutrophils Relative %: 57 %
Platelets: 398 10*3/uL (ref 150–400)
RBC: 4.18 MIL/uL (ref 3.87–5.11)
RDW: 14 % (ref 11.5–15.5)
WBC: 12.2 10*3/uL — ABNORMAL HIGH (ref 4.0–10.5)
nRBC: 0 % (ref 0.0–0.2)

## 2021-03-12 LAB — COMPREHENSIVE METABOLIC PANEL
ALT: 13 U/L (ref 0–44)
AST: 12 U/L — ABNORMAL LOW (ref 15–41)
Albumin: 4 g/dL (ref 3.5–5.0)
Alkaline Phosphatase: 56 U/L (ref 38–126)
Anion gap: 7 (ref 5–15)
BUN: 26 mg/dL — ABNORMAL HIGH (ref 8–23)
CO2: 20 mmol/L — ABNORMAL LOW (ref 22–32)
Calcium: 9.8 mg/dL (ref 8.9–10.3)
Chloride: 109 mmol/L (ref 98–111)
Creatinine, Ser: 0.96 mg/dL (ref 0.44–1.00)
GFR, Estimated: >60 mL/min (ref >60)
Glucose, Bld: 87 mg/dL (ref 70–99)
Potassium: 5.1 mmol/L (ref 3.5–5.1)
Sodium: 136 mmol/L (ref 135–145)
Total Bilirubin: 0.3 mg/dL (ref 0.3–1.2)
Total Protein: 7.3 g/dL (ref 6.5–8.1)

## 2021-03-12 LAB — URINALYSIS, ROUTINE W REFLEX MICROSCOPIC
Bilirubin Urine: NEGATIVE
Glucose, UA: NEGATIVE mg/dL
Hgb urine dipstick: NEGATIVE
Ketones, ur: NEGATIVE mg/dL
Nitrite: NEGATIVE
Protein, ur: NEGATIVE mg/dL
Specific Gravity, Urine: 1.028 (ref 1.005–1.030)
pH: 5 (ref 5.0–8.0)

## 2021-03-12 LAB — RAPID URINE DRUG SCREEN, HOSP PERFORMED
Amphetamines: NOT DETECTED
Barbiturates: NOT DETECTED
Benzodiazepines: NOT DETECTED
Cocaine: NOT DETECTED
Opiates: NOT DETECTED
Tetrahydrocannabinol: NOT DETECTED

## 2021-03-12 LAB — LACTIC ACID, PLASMA: Lactic Acid, Venous: 1.3 mmol/L (ref 0.5–1.9)

## 2021-03-12 LAB — CBG MONITORING, ED: Glucose-Capillary: 108 mg/dL — ABNORMAL HIGH (ref 70–99)

## 2021-03-12 MED ORDER — SODIUM CHLORIDE 0.9 % IV BOLUS: 1000.0000 mL | Freq: Once | INTRAVENOUS | Status: DC

## 2021-03-12 NOTE — ED Notes (Signed)
Patient is refusing IV fluids

## 2021-03-12 NOTE — ED Provider Notes (Signed)
Emergency Medicine Provider Triage Evaluation Note  Cynthia [name] , a 60 y.o. female  was evaluated in triage.  Pt complains of confusion/altered mental status.  History provided by both patient and daughter.  Patient has had confusion ever since having surgery a few weeks ago for Crohn's.  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  Episodes are intermittent.  When patient is confused, she will not know who she is, where she is, what is going on.  She will think she has things in her hands but they are not there.  Symptoms are worse at night.  No previous history of similar.  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  Review of Systems  Positive: confusion Negative: Weakness, fever  Physical Exam  BP 106/78 (BP Location: Left Arm)   Pulse (!) 116   Temp 98.2 F (36.8 C) (Oral)   Resp 16   Wt 52.4 kg   LMP 06/10/2011 (Exact Date)   SpO2 100%   BMI 20.48 kg/m  Gen:   Awake, no distress   Resp:  Normal effort  MSK:   Moves extremities without difficulty  Other:  Alert and oriented.  Responding appropriately.  Diffuse tenderness palpation of the abdomen, recent surgical scars noted.  Medical Decision Making  Medically screening exam initiated at 3:34 PM.  Appropriate orders placed.  Cynthia [name] was informed that the remainder of the evaluation will be completed by another provider, this initial triage assessment does not replace that evaluation, and the importance of remaining in the ED until their evaluation is complete.  Intermittent confusion since surgery.  Consider infection versus delirium versus drug use.  Labs and CT head ordered.   Franchot Heidelberg, PA-C 03/12/21 1536    Charlesetta Shanks, MD 03/21/21 [phone]

## 2021-03-12 NOTE — ED Triage Notes (Signed)
Pt had ostomy with bowel resection 2 weeks ago. Pt had hallucinations, mental health changes. Family concerned with withdraw of crack/cocaine. Pt hasnt used in at least 4 weeks. Having moments of confusion.  Pt having back pains.

## 2021-03-12 NOTE — ED Provider Notes (Signed)
[facility] DEPT Provider Note   CSN: [id number] Arrival date & time: 03/12/21  1427     History Chief Complaint  Patient presents with  . Altered Mental Status    ARALY KAAS is a 60 y.o. female.  HPI  60 year old female history of Crohn's disease status post bowel resection and ileostomy placement coup lower to her le weeks ago presents today with her daughter who states that she has been having episodes of confusion and not acting like herself.  Daughter states that she does some things out of the ordinary attempting to order pizza for the whole emergency department.  She has not noted any lateralized weakness.  She has a history of cocaine abuse and her daughter states that some of the symptoms are consistent with her previous cocaine use.  Daughter states that she is not clear when her mother last used cocaine.  She spoke with the surgeon who did not think it was related to surgery.  She also spoke with the primary care doctor who advised her to come the emergency department for complete evaluation.  Prior to my evaluation patient has had labs drawn.  CO2 is mildly decreased at 20 with a normal glucose remainder of her electrolytes are within normal limits.  He has a mild leukocytosis at 12,200 but no abnormal shift and hemoglobin is normal.  Head CT is performed prior to my evaluation does not show any evidence of acute bleed, infarct, but does have some atrophy and chronic small vessel ischemic changes.  Additionally, her daughter states that this is similar to when the patient's mother began having dementia.     Past Medical History:  Diagnosis Date  . Anemia 2012  . Anxiety   . Bronchitis   . Crohn's ileitis ([place])   . Depression   . GERD (gastroesophageal reflux disease)   . Headache(784.0)    MIGRAINE  . Heart murmur   . Hiatal hernia   . Hypertension   . IBS (irritable bowel syndrome)   . Incontinence   . MVP (mitral valve prolapse)    . RLS (restless legs syndrome)   . Tubular adenoma of colon 08/2019    Patient Active Problem List   Diagnosis Date Noted  . COVID-19 01/25/2021  . Leukocytosis 01/24/2021  . Crohn's colitis, with intestinal obstruction ([place]) 01/24/2021  . 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 History of rectal polyp 2020 01/12/2021  . Anxiety associated with depression 01/12/2021  . Hypokalemia 12/04/2020  . Cough 11/27/2020  . Otalgia of both ears 11/27/2020  . Body aches 11/27/2020  . Fever 11/27/2020  . High risk medication use 11/27/2020  . 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[place]) 06/27/2020  . 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 Crohn's ileitis, with intestinal obstruction ([place]) 10/05/2019  . Colitis   . Rectal polyp   . 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  . RLS (restless legs syndrome) 09/25/2014  . Current smoker 09/19/2011  . Migraine headache 09/19/2011  . Essential hypertension 09/19/2011  . Obesity (BMI 30-39.9) 09/19/2011  . History of serrated colonic polyp 01/13/2011  . Gastroesophageal reflux disease 03/17/2008  . MVP (mitral valve prolapse) 03/17/2008  . HIATAL HERNIA 05/03/2002    Past Surgical History:  Procedure Laterality Date  . ABDOMINAL HYSTERECTOMY  06/17/2011   Procedure: HYSTERECTOMY ABDOMINAL;  Surgeon: Arloa Koh;  Location: [facility];  Service: Gynecology;  Laterality: N/A;  Converted to Abdominal Hysterectomy with lysis of adhesions   . ANTERIOR AND POSTERIOR REPAIR  02/25/2012   Procedure: ANTERIOR (CYSTOCELE) AND POSTERIOR REPAIR (RECTOCELE);  Surgeon: Daria Pastures, MD;  Location: [facility];  Service: Gynecology;  Laterality: N/A;  ANterior cystocele repair ONLY  . BIOPSY  08/29/2019   Procedure: BIOPSY;  Surgeon: Lavena Bullion, DO;  Location: [facility];  Service: Gastroenterology;;  . BLADDER SUSPENSION  02/25/2012    Procedure: TRANSVAGINAL TAPE (TVT) PROCEDURE;  Surgeon: Daria Pastures, MD;  Location: [facility];  Service: Gynecology;  Laterality: N/A;  . COLONOSCOPY WITH PROPOFOL N/A 08/29/2019   Procedure: COLONOSCOPY WITH PROPOFOL;  Surgeon: Lavena Bullion, DO;  Location: [facility];  Service: Gastroenterology;  Laterality: N/A;  . CYSTOSCOPY  02/25/2012   Procedure: CYSTOSCOPY;  Surgeon: Daria Pastures, MD;  Location: [facility];  Service: Gynecology;  Laterality: N/A;  . KNEE ARTHROSCOPY  2010  . LAPAROSCOPIC ASSISTED VAGINAL HYSTERECTOMY  06/17/2011   Procedure: LAPAROSCOPIC ASSISTED VAGINAL HYSTERECTOMY;  Surgeon: Arloa Koh;  Location: [facility];  Service: Gynecology;  Laterality: N/A;  Attempted   . POLYPECTOMY  08/29/2019   Procedure: POLYPECTOMY;  Surgeon: Lavena Bullion, DO;  Location: [facility];  Service: Gastroenterology;;  . SALPINGOOPHORECTOMY  06/17/2011   Procedure: SALPINGO OOPHERECTOMY;  Surgeon: Arloa Koh;  Location: [facility];  Service: Gynecology;  Laterality: Bilateral;  . TONSILLECTOMY       OB History   No obstetric history on file.     Family History  Problem Relation Age of Onset  . Diabetes Mother   . Dementia Mother   . Clotting disorder Father   . Macular degeneration Father   . Crohn's disease Sister   . Colon cancer Neg Hx   . Esophageal cancer Neg Hx   . Pancreatic cancer Neg Hx   . Stomach cancer Neg Hx   . Liver disease Neg Hx     Social History   Tobacco Use  . Smoking status: Current Every Day Smoker    Packs/day: 0.50    Types: Cigarettes  . Smokeless tobacco: Never Used  . Tobacco comment: none in 2 weeks as of 12/04/20  Vaping Use  . Vaping Use: Former  Substance Use Topics  . Alcohol use: Not Currently  . Drug use: Yes    Types: Cocaine    Comment: socially; d/c 3 weeks ago per patient 05/31/20    Home Medications Prior to Admission medications   Medication Sig Start Date End Date Taking? Authorizing Provider   diphenhydramine-acetaminophen (TYLENOL PM) 25-500 MG TABS tablet Take 2 tablets by mouth at bedtime as needed (sleep/pain).    [provider]  esomeprazole (NEXIUM) 40 MG capsule TAKE ONE (1) CAPSULE BY MOUTH 2 TIMES DAILY 03/05/21   Denita Lung, MD  losartan-hydrochlorothiazide (HYZAAR) 100-12.5 MG tablet Take 1 tablet by mouth daily. 08/03/20   Denita Lung, MD  ondansetron (ZOFRAN-ODT) 4 MG disintegrating tablet Take 1 tablet (4 mg total) by mouth every 8 (eight) hours as needed for nausea or vomiting. Patient not taking: Reported on 03/05/2021 01/30/21   Bonnielee Haff, MD  phenylephrine (NEO-SYNEPHRINE) 1 % nasal spray Place 1 drop into both nostrils every 6 (six) hours as needed for congestion. Patient not taking: No sig reported 12/09/20   Cristal Deer, MD  polycarbophil (FIBERCON) 625 MG tablet Take 1 tablet (625 mg total) by mouth 2 (two) times daily. Patient not taking: No sig reported 01/13/21  Rai, Ripudeep K, MD  pramipexole (MIRAPEX) 1 MG tablet Take 1 tablet (1 mg total) by mouth daily. 03/05/21   Denita Lung, MD  predniSONE (DELTASONE) 10 MG tablet Take 20 mg (2 tabs of 57m each) twice daily for 2 weeks. Decrease prednisone dose by 10 mg every 2 weeks. 01/30/21   KBonnielee Haff MD  venlafaxine XR (EFFEXOR-XR) 150 MG 24 hr capsule Take 2 capsules (300 mg total) by mouth daily with breakfast. 03/05/21   LDenita Lung MD    Allergies    Iodine  Review of Systems   Review of Systems  Physical Exam Updated Vital Signs BP (!) 81/43   Pulse (!) 108   Temp (!) 97.4 F (36.3 C) (Oral)   Resp 20   Ht 1.626 m (5' 4" )   Wt 51.7 kg   LMP 06/10/2011 (Exact Date)   SpO2 100%   BMI 19.57 kg/m   Physical Exam Vitals and nursing note reviewed.  HENT:     Head: Normocephalic.     Right Ear: External ear normal.     Left Ear: External ear normal.     Nose: Nose normal.     Mouth/Throat:     Pharynx: Oropharynx is clear.  Eyes:     Pupils: Pupils are equal,  round, and reactive to light.  Cardiovascular:     Rate and Rhythm: Normal rate and regular rhythm.     Pulses: Normal pulses.  Pulmonary:     Effort: Pulmonary effort is normal.     Breath sounds: Normal breath sounds.  Abdominal:     General: Abdomen is flat.     Comments: Ostomy in place, no ttp  Musculoskeletal:        General: Normal range of motion.     Cervical back: Normal range of motion.  Skin:    General: Skin is warm and dry.     Capillary Refill: Capillary refill takes less than 2 seconds.  Neurological:     General: No focal deficit present.     Mental Status: She is alert and oriented to person, place, and time.     Cranial Nerves: No cranial nerve deficit.     Motor: No weakness.     Coordination: Coordination normal.     Deep Tendon Reflexes: Reflexes normal.  Psychiatric:        Mood and Affect: Mood normal.     ED Results / Procedures / Treatments   Labs (all labs ordered are listed, but only abnormal results are displayed) Labs Reviewed  COMPREHENSIVE METABOLIC PANEL - Abnormal; Notable for the following components:      Result Value   CO2 20 (*)    BUN 26 (*)    AST 12 (*)    All other components within normal limits  CBC WITH DIFFERENTIAL/PLATELET - Abnormal; Notable for the following components:   WBC 12.2 (*)    Abs Immature Granulocytes 0.13 (*)    All other components within normal limits  CBG MONITORING, ED - Abnormal; Notable for the following components:   Glucose-Capillary 108 (*)    All other components within normal limits  LACTIC ACID, PLASMA  URINALYSIS, ROUTINE W REFLEX MICROSCOPIC  RAPID URINE DRUG SCREEN, HOSP PERFORMED    EKG None  Radiology DG Chest 2 View  Result Date: 03/12/2021 CLINICAL DATA:  Confusion and history of recent bowel resection, initial encounter EXAM: CHEST - 2 VIEW COMPARISON:  05/09/2026 FINDINGS: The heart size and mediastinal contours are  within normal limits. Both lungs are clear. The visualized  skeletal structures are unremarkable. IMPRESSION: No active cardiopulmonary disease. Electronically Signed   By: Inez Catalina M.D.   On: 03/12/2021 16:40   CT Head Wo Contrast  Result Date: 03/12/2021 CLINICAL DATA:  Mental status change EXAM: CT HEAD WITHOUT CONTRAST TECHNIQUE: Contiguous axial images were obtained from the base of the skull through the vertex without intravenous contrast. COMPARISON:  None. FINDINGS: Brain: No acute territorial infarction, hemorrhage or intracranial mass. Mild atrophy. Minimal white matter hypodensity. Nonenlarged ventricles Vascular: No hyperdense vessels.  No unexpected calcification Skull: Normal. Negative for fracture or focal lesion. Sinuses/Orbits: No acute finding. Other: None IMPRESSION: 1. No CT evidence for acute intracranial abnormality. 2. Atrophy and mild chronic small vessel ischemic change of the white matter Electronically Signed   By: Donavan Foil M.D.   On: 03/12/2021 16:27    Procedures Procedures   Medications Ordered in ED Medications  sodium chloride 0.9 % bolus 1,000 mL (has no administration in time range)    ED Course  I have reviewed the triage vital signs and the nursing notes.  Pertinent labs & imaging results that were available during my care of the patient were reviewed by me and considered in my medical decision making (see chart for details).    MDM Rules/Calculators/A&P                         60 year old lady family history of dementia presents today with some intermittent confusion per her daughter.  He has had recent surgery on her abdomen.  Her daughter reports she has been taking p.o. okay.  Labs checked here and had no evidence of acute metabolic abnormality to explain her symptoms.  Urinalysis shows 6-10 white blood cells but no acute UTI identified.  Patient refused IV.  Not feeling she definitely needs this as she has been taking p.o. okay and she does not have an elevated creatinine.  Awaiting urine drug screen.   Plan discharge to home with referral to neurology as outpatient Final Clinical Impression(s) / ED Diagnoses Final diagnoses:  Episodic confusion    Rx / DC Orders ED Discharge Orders    None       Pattricia Boss, MD 03/12/21 2324

## 2021-03-13 ENCOUNTER — Ambulatory Visit (HOSPITAL_COMMUNITY)
Admission: EM | Admit: 2021-03-13 | Discharge: 2021-03-13 | Disposition: A | Discharge status: Other Acute Inpatient Hospital | Payer: 59 | Attending: Student in an Organized Health Care Education/Training Program | Admitting: Student in an Organized Health Care Education/Training Program

## 2021-03-13 ENCOUNTER — Other Ambulatory Visit: Payer: Self-pay

## 2021-03-13 ENCOUNTER — Emergency Department (HOSPITAL_COMMUNITY)
Admission: EM | Admit: 2021-03-13 | Discharge: 2021-03-14 | Disposition: A | Discharge status: Home / Self Care | Payer: 59 | Attending: Emergency Medicine | Admitting: Emergency Medicine

## 2021-03-13 ENCOUNTER — Encounter (HOSPITAL_COMMUNITY): Payer: Self-pay

## 2021-03-13 DIAGNOSIS — G4700 Insomnia, unspecified: Secondary | ICD-10-CM | POA: Insufficient documentation

## 2021-03-13 DIAGNOSIS — F17210 Nicotine dependence, cigarettes, uncomplicated: Secondary | ICD-10-CM | POA: Insufficient documentation

## 2021-03-13 DIAGNOSIS — Z20822 Contact with and (suspected) exposure to covid-19: Secondary | ICD-10-CM | POA: Insufficient documentation

## 2021-03-13 DIAGNOSIS — Z8616 Personal history of COVID-19: Secondary | ICD-10-CM | POA: Insufficient documentation

## 2021-03-13 DIAGNOSIS — I10 Essential (primary) hypertension: Secondary | ICD-10-CM | POA: Diagnosis not present

## 2021-03-13 DIAGNOSIS — F23 Brief psychotic disorder: Secondary | ICD-10-CM | POA: Diagnosis not present

## 2021-03-13 DIAGNOSIS — F4320 Adjustment disorder, unspecified: Secondary | ICD-10-CM | POA: Diagnosis not present

## 2021-03-13 DIAGNOSIS — F4325 Adjustment disorder with mixed disturbance of emotions and conduct: Secondary | ICD-10-CM | POA: Diagnosis not present

## 2021-03-13 DIAGNOSIS — E43 Unspecified severe protein-calorie malnutrition: Secondary | ICD-10-CM | POA: Diagnosis not present

## 2021-03-13 DIAGNOSIS — Z79899 Other long term (current) drug therapy: Secondary | ICD-10-CM | POA: Diagnosis not present

## 2021-03-13 DIAGNOSIS — K50919 Crohn's disease, unspecified, with unspecified complications: Secondary | ICD-10-CM | POA: Insufficient documentation

## 2021-03-13 DIAGNOSIS — F32A Depression, unspecified: Secondary | ICD-10-CM | POA: Diagnosis present

## 2021-03-13 LAB — RAPID URINE DRUG SCREEN, HOSP PERFORMED
Amphetamines: NOT DETECTED
Barbiturates: NOT DETECTED
Benzodiazepines: NOT DETECTED
Cocaine: NOT DETECTED
Opiates: NOT DETECTED
Tetrahydrocannabinol: NOT DETECTED

## 2021-03-13 LAB — URINALYSIS, ROUTINE W REFLEX MICROSCOPIC
Bilirubin Urine: NEGATIVE
Glucose, UA: NEGATIVE mg/dL
Hgb urine dipstick: NEGATIVE
Ketones, ur: 5 mg/dL — AB
Nitrite: NEGATIVE
Protein, ur: 30 mg/dL — AB
Specific Gravity, Urine: 1.025 (ref 1.005–1.030)
pH: 5 (ref 5.0–8.0)

## 2021-03-13 MED ORDER — VENLAFAXINE HCL ER 150 MG PO CP24
300.0000 mg | ORAL_CAPSULE | Freq: Every day | ORAL | Status: DC
  Administered 2021-03-14: 300 mg via ORAL
  Filled 2021-03-13 (×3): qty 2

## 2021-03-13 MED ORDER — OLANZAPINE 2.5 MG PO TABS
2.5000 mg | ORAL_TABLET | Freq: Once | ORAL | Status: AC
  Administered 2021-03-13: 2.5 mg via ORAL
  Filled 2021-03-13: qty 1

## 2021-03-13 MED ORDER — PRAMIPEXOLE DIHYDROCHLORIDE 1 MG PO TABS
1.0000 mg | ORAL_TABLET | Freq: Every evening | ORAL | Status: DC | PRN
  Filled 2021-03-13: qty 1

## 2021-03-13 MED ORDER — PANTOPRAZOLE SODIUM 40 MG PO TBEC
80.0000 mg | DELAYED_RELEASE_TABLET | Freq: Every day | ORAL | Status: DC
  Administered 2021-03-14: 80 mg via ORAL
  Filled 2021-03-13: qty 2

## 2021-03-13 MED ORDER — HYDROCHLOROTHIAZIDE 12.5 MG PO CAPS: 12.5000 mg | ORAL_CAPSULE | Freq: Every day | ORAL | Status: DC

## 2021-03-13 MED ORDER — LOSARTAN POTASSIUM-HCTZ 100-12.5 MG PO TABS: 1.0000 | ORAL_TABLET | Freq: Every day | ORAL | Status: DC

## 2021-03-13 MED ORDER — LOSARTAN POTASSIUM 50 MG PO TABS: 100.0000 mg | ORAL_TABLET | Freq: Every day | ORAL | Status: DC

## 2021-03-13 NOTE — ED Notes (Signed)
Pt transferred to [facility] via EMS per Dr. Candie Chroman for higher level of care due to colostomy care and AH. Safety maintained.

## 2021-03-13 NOTE — ED Notes (Signed)
Pt is requesting to leave.

## 2021-03-13 NOTE — Discharge Instructions (Addendum)
Patient is instructed to take all prescribed medications as recommended. Report any side effects or adverse reactions to your outpatient psychiatrist. Patient is instructed to abstain from alcohol and illegal drugs while on prescription medications. In the event of worsening symptoms, patient is instructed to call the crisis hotline, 911, or go to the nearest emergency department for evaluation and treatment.

## 2021-03-13 NOTE — ED Provider Notes (Signed)
[facility] Medical Screening Exam  Patient Name: Cynthia [name] MRN: [id number] Date of Evaluation: 03/13/21 Chief Complaint: Chief Complaint/Presenting Problem: AMS Diagnosis:  Final diagnoses:  Brief psychotic disorder ([place])    History of Present illness: ADAMAE [name] is a 60 y.o. 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 Patient has been having bizarre behaviors, AVH, and delusions since her surgery 3 weeks ago. Patient reports that she did have a daily cocaine habit that started 5 years ago, but she reports that she has not done cocaine in 8 mon. Patient reports that she heard the nurses talking about something when she was getting her CT Head in the ED yesterday, 03/12/2021 but her sister reports that what she heard did not make sense. Patient also reports that she has been seeing things and then seeing that they are not actually there or her sister tells her that there is nothing there. Patient reports "I know something is wrong." Patient perseverates on her rights being taking away and reports that she [place] that her family is trying to remove her from her sister's home and that her sister does not want her there. Patient reports that she believes that they are trying to "commit me."  Spoke with patient's sister Juliann Pulse: Juliann Pulse reports that patient has not been sleeping well and spends the night roaming the house and does not appear to require sleep. Juliann Pulse also notes that after patient first moved in she took the patient to the store for clothes and the patient appeared "euphoric" and easily distracted and "disorganized." Juliann Pulse is able to confirm that patient has been cocaine free since at least 02/15/2021 but she cannot confirm beyond that and reports that a friend reported that patient had done cocaine within the past 8  mon. Contact Juliann Pulse @ : [phone] Psychiatric Specialty Exam  Presentation  General Appearance:Casual  Eye Contact:Good  Speech:Clear and Coherent  Speech Volume:Normal  Handedness:-- (Defer)   Mood and Affect  Mood:Angry; Irritable; Anxious  Affect:Congruent   Thought Process  Thought Processes:Goal Directed  Descriptions of Associations:Intact  Orientation:Full (Time, Place and Person)  Thought Content:Illogical; Perseveration  Diagnosis of Schizophrenia or Schizoaffective disorder in past: No  Duration of Psychotic Symptoms: Less than six months  Hallucinations:Auditory; Visual I heard the nurses in the ED say some things but my sister told me it was not true seeing myself give my sister my credit card but then there is nothing in my hand  Ideas of Reference:Delusions  Suicidal Thoughts:No  Homicidal Thoughts:No   Sensorium  Memory:Immediate Good; Recent Fair; Remote [name]   Executive Functions  Concentration:Good  Attention Span:Good  [place] of Knowledge:Good  Language:Good   Psychomotor Activity  Psychomotor Activity:Normal   Assets  Assets:Housing; Data processing manager; Resilience   Sleep  Sleep:Poor  Number of hours: No data recorded  No data recorded  Physical Exam: Physical Exam Constitutional:      Appearance: Normal appearance.     Comments: Tearful intermittently  HENT:     Head: Normocephalic and atraumatic.     Nose: Nose normal.  Eyes:     Extraocular Movements: Extraocular movements intact.     Pupils: Pupils are equal, round, and reactive to light.  Cardiovascular:     Rate and Rhythm: Normal rate.  Pulses: Normal pulses.  Pulmonary:     Effort: Pulmonary effort is normal.  Abdominal:     Comments: Colostomy bag appears to be draining properly. No erythema around the area noted  Musculoskeletal:        General: Normal range of motion.  Skin:    General: Skin  is warm and dry.  Neurological:     General: No focal deficit present.     Mental Status: She is alert.    Review of Systems  Constitutional: Negative for chills and fever.  HENT: Negative for hearing loss.   Eyes: Negative for blurred vision.  Respiratory: Negative for cough and wheezing.   Cardiovascular: Negative for chest pain.  Gastrointestinal: Positive for vomiting. Negative for abdominal pain.  Neurological: Negative for dizziness.  Psychiatric/Behavioral: Positive for hallucinations. Negative for substance abuse and suicidal ideas.   Blood pressure 112/89, pulse (!) 116, temperature 97.8 F (36.6 C), temperature source Oral, resp. rate 20, height 5' 3"  (1.6 m), weight 115 lb (52.2 kg), last menstrual period 06/10/2011, SpO2 100 %. Body mass index is 20.37 kg/m.  Musculoskeletal: Strength & Muscle Tone: within normal limits Gait & Station: normal Patient leans: N/A   [place] [facility] Discharge Disposition for Follow up and Recommendations: [place] is not able to maintain ADL's as she requires significant assisntance with her fairly new colostomy bag and therefore must be transferred to the ED. Acute Psychosis - Zyprexa 2.81m once, monitor response - Transfer to ED - Psychiatry will continue to follow - Patient had labs and imaging yesterday ruling out cocaine/ substance use, UTI, or organic brain pathology. WBC was also on a downward trend - Continue care of colostomy - Patient initially was reluctant to go to ED but decided she would rather go voluntarily than under IVC   PGY-1 JFreida Busman MD 03/13/2021, 12:21 PM

## 2021-03-13 NOTE — ED Notes (Signed)
Report given to [place], Agricultural consultant per Dr. Candie Chroman

## 2021-03-13 NOTE — BH Assessment (Addendum)
Comprehensive Clinical Assessment (CCA) Note  03/13/2021 Cynthia [name] [id number]  Disposition: Per Damita Dunnings, MD patient is recommended for inpatient treatment. Patient to transfer to [facility] due to medical conditions not appropriate for [facility] observation unit.   [place] ED from 03/13/2021 in [place] Most recent reading at 03/13/2021  1:17 PM ED from 03/13/2021 in [facility] Most recent reading at 03/13/2021 12:03 PM ED from 03/12/2021 in [place] DEPT Most recent reading at 03/12/2021  3:08 PM  C-SSRS RISK CATEGORY Error: Q2 is Yes, you must answer 3, 4, and 5 No Risk No Risk      The patient demonstrates the following risk factors for suicide: Chronic risk factors for suicide include: psychiatric disorder of MDD, substance use disorder and medical illness crohn's disease. Acute risk factors for suicide include: family or marital conflict. Protective factors for this patient include: positive social support and positive therapeutic relationship. Considering these factors, the overall suicide risk at this point appears to be moderate. Patient is not appropriate for outpatient follow up.    Cynthia [name] is a 60 year old female presenting to [facility] with chief complaint of altered mental status since surgery for Crohn's Disease three weeks ago.  Patient was taken to [facility] yesterday by her daughter due to concerns of her mental status. Patient was cleared by EDP and discharged home.  Today patient was  found naked in her room trying to remove her colostomy bag this morning. Patient family reports other bizarre behavior yesterday such as patient being found at the end of a driveway wrapped in a blanket and thinking she has done things in the home such as cook big meals when she hasn't.   Patient reports that she is living with her sister and yesterday after returning home from the ED she felt  like her sister was disappointed that the ED did not make her go into inpatient. Patient states that her sister yelled at her and told her to go to her room. Patient reports waking up to a full colostomy bag. Patient reports that she was attempting to change the bag and soiled her clothes. Patient reports removing all her clothes and sitting on the bed waiting for help. Patient reports that her sister came into the room and yelled at her, and they began to argue. Patient reports that she overheard her sister state that she did not want a "drug addict" in her home near her kids. Patient reports feeling disturbed by what she heard and now she does not want to return to live her sister. Patient reports that she owns a house that is a Chiropractor and crack house". Patient states that she can not return to the house due to the condition its in. Patient reports being sober from crack cocaine for the past eight months. Patient reports that the crack use made the disease worse and caused her to have the emergency surgery.   MD obtained collateral from patient sister to include patient having AVH and delusions causing her to be a threat to herself. See MD note for detailed note.   Patient is oriented to person, place and situation. Patient is alert, engaged and cooperative during assessment. Patient eye contact and tone of voice is normal. Patient appears anxious and is tearful when discussing recent surgery and negative interactions with her family. Patient reports symptoms related to depression and anxiety and denies SI, HI, AVH, SIB and recent drug use.  Chief Complaint:  Chief Complaint  Patient presents with  . Delusional    Per report, Pt has had episodes of altered mental status since surgery for Crohn's Disease three weeks ago.  Was at [facility].  Today threw her belongings in a basket, said a friend had dropped off a car, and that she was leaving her sister's home.  Pt was naked.  Other bizarre  behavior yesterday -- found at the end of a driveway wrapped in a blanet.  . 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 Family/Friend  Referral name: No data recorded Referral phone number: No data recorded  Whom do you see for routine medical problems? No data recorded Practice/Facility Name: No data recorded Practice/Facility Phone Number: No data recorded Name of Contact: No data recorded Contact Number: No data recorded Contact Fax Number: No data recorded Prescriber Name: No data recorded Prescriber Address (if known): No data recorded  What Is the Reason for Your Visit/Call Today? Delusion, bizarre behavior  How Long Has This Been Causing You Problems? 1 wk - 1 month  What Do You Feel Would Help You the Most Today? Medication(s)   Have You Recently Been in Any Inpatient Treatment (Hospital/Detox/Crisis Center/28-Day Program)? No data recorded Name/Location of Program/Hospital:No data recorded How Long Were You There? No data recorded When Were You Discharged? No data recorded  Have You Ever Received Services From [facility] Before? No data recorded Who Do You See at [facility]? No data recorded  Have You Recently Had Any Thoughts About Hurting Yourself? No  Are You Planning to Commit Suicide/Harm Yourself At This time? No   Have you Recently Had Thoughts About [place]? No  Explanation: No data recorded  Have You Used Any Alcohol or Drugs in the Past 24 Hours? No  How Long Ago Did You Use Drugs or Alcohol? No data recorded What Did You Use and How Much? No data recorded  Do You Currently Have a Therapist/Psychiatrist? No data recorded Name of Therapist/Psychiatrist: No data recorded  Have You Been Recently Discharged From Any Office Practice or Programs? No data recorded Explanation of Discharge From  Practice/Program: No data recorded    CCA Screening Triage Referral Assessment Type of Contact: No data recorded Is this Initial or Reassessment? No data recorded Date Telepsych consult ordered in [facility]:  No data recorded Time Telepsych consult ordered in [facility]:  No data recorded  Patient Reported Information Reviewed? No data recorded Patient Left Without Being Seen? No data recorded Reason for Not Completing Assessment: No data recorded  Collateral Involvement: No data recorded  Does Patient Have a [place]? No data recorded Name and Contact of Legal Guardian: No data recorded If Minor and Not Living with Parent(s), Who has Custody? No data recorded Is CPS involved or ever been involved? No data recorded Is APS involved or ever been involved? No data recorded  Patient Determined To Be At Risk for Harm To Self or Others Based on Review of Patient Reported Information or Presenting Complaint? No data recorded Method: No data recorded Availability of Means: No data recorded Intent: No data recorded Notification Required: No data recorded Additional Information for Danger to Others Potential: No data recorded Additional Comments for Danger to Others Potential: No data recorded Are There Guns or Other Weapons in Your Home? No data recorded Types of  Guns/Weapons: No data recorded Are These Weapons Safely Secured?                            No data recorded Who Could Verify You Are Able To Have These Secured: No data recorded Do You Have any Outstanding Charges, Pending Court Dates, Parole/Probation? No data recorded Contacted To Inform of Risk of Harm To Self or Others: No data recorded  Location of Assessment: No data recorded  Does Patient Present under Involuntary Commitment? No data recorded IVC Papers Initial File Date: No data recorded  [place] of Residence: No data recorded  Patient Currently Receiving the Following Services: No data  recorded  Determination of Need: Urgent (48 hours)   Options For Referral: No data recorded    CCA Biopsychosocial Intake/Chief Complaint:  AMS  Current Symptoms/Problems: Depressed, anxiety   Patient Reported Schizophrenia/Schizoaffective Diagnosis in Past: No   Strengths: NA  Preferences: NA  Abilities: NA   Type of Services Patient Feels are Needed: Therapy and NA meetings   Initial Clinical Notes/Concerns: No data recorded  Mental Health Symptoms Depression:  Irritability; Tearfulness; Sleep (too much or little); Change in energy/activity   Duration of Depressive symptoms: Less than two weeks   Mania:  None   Anxiety:   Worrying   Psychosis:  Delusions; Hallucinations   Duration of Psychotic symptoms: Less than six months   Trauma:  None   Obsessions:  None   Compulsions:  None   Inattention:  None   Hyperactivity/Impulsivity:  N/A   Oppositional/Defiant Behaviors:  None   Emotional Irregularity:  None   Other Mood/Personality Symptoms:  No data recorded   Mental Status Exam Appearance and self-care  Stature:  Average   Weight:  Average weight   Clothing:  Neat/clean; Age-appropriate   Grooming:  Normal   Cosmetic use:  Age appropriate   Posture/gait:  Normal   Motor activity:  Not Remarkable   Sensorium  Attention:  Normal   Concentration:  Normal   Orientation:  Person; Situation; Place   Recall/memory:  Normal   Affect and Mood  Affect:  Tearful; Anxious   Mood:  Anxious   Relating  Eye contact:  Normal   Facial expression:  Responsive   Attitude toward examiner:  Cooperative   Thought and Language  Speech flow: Clear and Coherent   Thought content:  Appropriate to Mood and Circumstances   Preoccupation:  None   Hallucinations:  None   Organization:  No data recorded  [employer] of Knowledge:  Fair   Intelligence:  Average   Abstraction:  Normal   Judgement:  Impaired   Reality  Testing:  Variable   Insight:  Fair   Decision Making:  No data recorded  Social Functioning  Social Maturity:  Responsible   Social Judgement:  Normal   Stress  Stressors:  Housing; Family conflict; Illness   Coping Ability:  Overwhelmed   Skill Deficits:  None   Supports:  Family     Religion:    Leisure/Recreation:    Exercise/Diet: Exercise/Diet Do You Have Any Trouble Sleeping?: Yes   CCA Employment/Education Employment/Work Situation: Employment / Work Situation Employment situation: Unemployed What is the longest time patient has a held a job?: NA Where was the patient employed at that time?: NA  Education: Education Is Patient Currently Attending School?: No   CCA Family/Childhood History Family and Relationship History: Family history Marital status: Widowed Widowed,  when?: 16 years ago What is your sexual orientation?: NA Has your sexual activity been affected by drugs, alcohol, medication, or emotional stress?: NA Does patient have children?: Yes  Childhood History:  Childhood History Additional childhood history information: NA Description of patient's relationship with caregiver when they were a child: NA Patient's description of current relationship with people who raised him/her: NA How were you disciplined when you got in trouble as a child/adolescent?: NA  Child/Adolescent Assessment:     CCA Substance Use Alcohol/Drug Use: Alcohol / Drug Use Pain Medications: See MAR Prescriptions: See MAR Over the Counter: See MAR History of alcohol / drug use?: Yes Negative Consequences of Use: Personal relationships Substance #1 Name of Substance 1: Crack cocaine 1 - Age of First Use: 34 1 - Last Use / Amount: Pt reports last time using was 8 months ago                       ASAM's:  Six Dimensions of Multidimensional Assessment  Dimension 1:  Acute Intoxication and/or Withdrawal Potential:      Dimension 2:  Biomedical  Conditions and Complications:      Dimension 3:  Emotional, Behavioral, or Cognitive Conditions and Complications:     Dimension 4:  Readiness to Change:     Dimension 5:  Relapse, Continued use, or Continued Problem Potential:     Dimension 6:  Recovery/Living Environment:     ASAM Severity Score:    ASAM Recommended Level of Treatment:     Substance use Disorder (SUD)    Recommendations for Services/Supports/Treatments: Recommendations for Services/Supports/Treatments Recommendations For Services/Supports/Treatments: Individual Therapy  DSM5 Diagnoses: Patient Active Problem List   Diagnosis Date Noted  . COVID-19 01/25/2021  . Leukocytosis 01/24/2021  . Crohn's colitis, with intestinal obstruction ([place]) 01/24/2021  . 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 History of rectal polyp 2020 01/12/2021  . Anxiety associated with depression 01/12/2021  . Hypokalemia 12/04/2020  . Cough 11/27/2020  . Otalgia of both ears 11/27/2020  . Body aches 11/27/2020  . Fever 11/27/2020  . High risk medication use 11/27/2020  . 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[place]) 06/27/2020  . 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 Crohn's ileitis, with intestinal obstruction ([place]) 10/05/2019  . Colitis   . Rectal polyp   . 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  . RLS (restless legs syndrome) 09/25/2014  . Current smoker 09/19/2011  . Migraine headache 09/19/2011  . Essential hypertension 09/19/2011  . Obesity (BMI 30-39.9) 09/19/2011  . History of serrated colonic polyp 01/13/2011  . Gastroesophageal reflux disease 03/17/2008  . MVP (mitral valve prolapse) 03/17/2008  . HIATAL HERNIA 05/03/2002   Disposition: Per Damita Dunnings, MD patient is recommended for inpatient treatment. Patient to transfer to ED due to recent surgery and medical conditions not appropriate for [facility]  observation unit.   [place], [facility]

## 2021-03-13 NOTE — Progress Notes (Signed)
Patient received Zyprexa 2.57m tablet without difficulty.

## 2021-03-13 NOTE — ED Notes (Signed)
[facility] non-emergent service called for transport to [facility] per MD instructions.

## 2021-03-13 NOTE — BH Assessment (Addendum)
Disposition:   Per Damita Dunnings, MD patient is recommended for inpatient treatment. Patient to transfer to [facility] due to medical conditions no appropriate for [facility] observation unit.   [facility] AC Wynonia Hazard, RN) notified of patient's bed needs and asked to review for a [facility] admission. Provided updates from  [place], no appropriate beds are available at [facility].    Patient referred to the following facilities for consideration of bed placement.  [facility] Details  [facility] Details  [facility] Details  [facility] Details  [facility] Details  [facility] [place] Details  [place] Details  [facility] Details  [facility] Details  [facility] Details  [facility] Details  [facility] Details  [facility] Details  [facility] Details  [facility] Details  [facility] Details  [facility] Details  [place] Details  [facility] Details  [facility] Details  [facility] Details  [place] Details  [facility] Details  [facility] Details  [facility] Details  [facility] Details  [facility] Details  [facility] Details [place] Details  [facility] Details  [place]

## 2021-03-13 NOTE — ED Notes (Signed)
Dinner tray ordered by [employer]

## 2021-03-13 NOTE — ED Provider Notes (Signed)
[facility] EMERGENCY DEPARTMENT Provider Note   CSN: [id number] Arrival date & time: 03/13/21  1312     History Chief Complaint  Patient presents with  . Altered Mental Status    Cynthia [name] is a 60 y.o. female.  Cynthia [name] has a history of Crohn's disease.  She also has a history of depression and has been managed for at least 16 years on Effexor.  She has a history of cocaine use in the past and denies recent cocaine use.  She was brought to behavioral health urgent care by her sister today.  She was sent to the ED rather than managed there because of concerns about the patient being able to manage her colostomy.    Patient recently underwent surgery for her Crohn's disease and has an ostomy.  She states that she has been having difficulty adapting to the fact that she now "shits out of her stomach."  She has had difficulty with oral intake and has had some weight loss.  She went to her sister's house to convalesce, and over the past few days, she has been slightly confused at times.    She was taken to [facility] yesterday where she received an extensive evaluation including labs, urinalysis, and head CT.  Work-up was essentially normal.  One of the episodes of confusion noted in previous providers notes was that the patient thought she heard something being said about her CT scan that she received yesterday.  The patient actually volunteered this information to me, and she says that she thought that somebody said that she had some small head bleeds that could have explained her symptoms.   The patient describes having difficulty sleeping.  She will try to go to bed at 9 PM when her sister does, fall asleep for about 15 minutes, and wake up and be awake the rest of the night.  This is quite distressing to her.    She also states that she was not taking her Effexor for the 2 weeks that she was in the hospital.  She resumed taking her medication  after seeing her primary care doctor in follow-up, but she has only noticed a small amount of benefit.  The history is provided by the patient.  Altered Mental Status Presenting symptoms: confusion   Severity:  Moderate Most recent episode:  Today Episode history:  Multiple Timing:  Intermittent Progression:  Unchanged Chronicity:  New Context: recent change in medication and recent illness   Associated symptoms: depression   Associated symptoms: no abdominal pain, no fever, no nausea, no palpitations, no rash, no seizures, no suicidal behavior and no vomiting        Past Medical History:  Diagnosis Date  . Anemia 2012  . Anxiety   . Bronchitis   . Crohn's ileitis ([place])   . Depression   . GERD (gastroesophageal reflux disease)   . Headache(784.0)    MIGRAINE  . Heart murmur   . Hiatal hernia   . Hypertension   . IBS (irritable bowel syndrome)   . Incontinence   . MVP (mitral valve prolapse)   . RLS (restless legs syndrome)   . Tubular adenoma of colon 08/2019    Patient Active Problem List   Diagnosis Date Noted  . COVID-19 01/25/2021  . Leukocytosis 01/24/2021  . Crohn's colitis, with intestinal obstruction ([place]) 01/24/2021  . 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 History of rectal polyp 2020 01/12/2021  .  Anxiety associated with depression 01/12/2021  . Hypokalemia 12/04/2020  . Cough 11/27/2020  . Otalgia of both ears 11/27/2020  . Body aches 11/27/2020  . Fever 11/27/2020  . High risk medication use 11/27/2020  . 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[place]) 06/27/2020  . 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 Crohn's ileitis, with intestinal obstruction ([place]) 10/05/2019  . Colitis   . Rectal polyp   . 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  . RLS (restless legs syndrome) 09/25/2014  . Current smoker 09/19/2011  . Migraine headache  09/19/2011  . Essential hypertension 09/19/2011  . Obesity (BMI 30-39.9) 09/19/2011  . History of serrated colonic polyp 01/13/2011  . Gastroesophageal reflux disease 03/17/2008  . MVP (mitral valve prolapse) 03/17/2008  . HIATAL HERNIA 05/03/2002    Past Surgical History:  Procedure Laterality Date  . ABDOMINAL HYSTERECTOMY  06/17/2011   Procedure: HYSTERECTOMY ABDOMINAL;  Surgeon: Arloa Koh;  Location: [facility];  Service: Gynecology;  Laterality: N/A;  Converted to Abdominal Hysterectomy with lysis of adhesions   . ANTERIOR AND POSTERIOR REPAIR  02/25/2012   Procedure: ANTERIOR (CYSTOCELE) AND POSTERIOR REPAIR (RECTOCELE);  Surgeon: Daria Pastures, MD;  Location: [facility];  Service: Gynecology;  Laterality: N/A;  ANterior cystocele repair ONLY  . BIOPSY  08/29/2019   Procedure: BIOPSY;  Surgeon: Lavena Bullion, DO;  Location: [facility];  Service: Gastroenterology;;  . BLADDER SUSPENSION  02/25/2012   Procedure: TRANSVAGINAL TAPE (TVT) PROCEDURE;  Surgeon: Daria Pastures, MD;  Location: [facility];  Service: Gynecology;  Laterality: N/A;  . COLONOSCOPY WITH PROPOFOL N/A 08/29/2019   Procedure: COLONOSCOPY WITH PROPOFOL;  Surgeon: Lavena Bullion, DO;  Location: [facility];  Service: Gastroenterology;  Laterality: N/A;  . CYSTOSCOPY  02/25/2012   Procedure: CYSTOSCOPY;  Surgeon: Daria Pastures, MD;  Location: [facility];  Service: Gynecology;  Laterality: N/A;  . KNEE ARTHROSCOPY  2010  . LAPAROSCOPIC ASSISTED VAGINAL HYSTERECTOMY  06/17/2011   Procedure: LAPAROSCOPIC ASSISTED VAGINAL HYSTERECTOMY;  Surgeon: Arloa Koh;  Location: [facility];  Service: Gynecology;  Laterality: N/A;  Attempted   . POLYPECTOMY  08/29/2019   Procedure: POLYPECTOMY;  Surgeon: Lavena Bullion, DO;  Location[facility];  Service: Gastroenterology;;  . SALPINGOOPHORECTOMY  06/17/2011   Procedure: SALPINGO OOPHERECTOMY;  Surgeon: Arloa Koh;  Location: [facility];  Service: Gynecology;  Laterality:  Bilateral;  . TONSILLECTOMY       OB History   No obstetric history on file.     Family History  Problem Relation Age of Onset  . Diabetes Mother   . Dementia Mother   . Clotting disorder Father   . Macular degeneration Father   . Crohn's disease Sister   . Colon cancer Neg Hx   . Esophageal cancer Neg Hx   . Pancreatic cancer Neg Hx   . Stomach cancer Neg Hx   . Liver disease Neg Hx     Social History   Tobacco Use  . Smoking status: Current Every Day Smoker    Packs/day: 0.50    Types: Cigarettes  . Smokeless tobacco: Never Used  . Tobacco comment: none in 2 weeks as of 12/04/20  Vaping Use  . Vaping Use: Former  Substance Use Topics  . Alcohol use: Not Currently  . Drug use: Yes    Types: Cocaine    Comment: socially; d/c 3 weeks ago per patient 05/31/20  Home Medications Prior to Admission medications   Medication Sig Start Date End Date Taking? Authorizing Provider  diphenhydramine-acetaminophen (TYLENOL PM) 25-500 MG TABS tablet Take 2 tablets by mouth at bedtime as needed (sleep/pain).    [provider]  esomeprazole (NEXIUM) 40 MG capsule TAKE ONE (1) CAPSULE BY MOUTH 2 TIMES DAILY 03/05/21   Denita Lung, MD  losartan-hydrochlorothiazide (HYZAAR) 100-12.5 MG tablet Take 1 tablet by mouth daily. 08/03/20   Denita Lung, MD  ondansetron (ZOFRAN-ODT) 4 MG disintegrating tablet Take 1 tablet (4 mg total) by mouth every 8 (eight) hours as needed for nausea or vomiting. Patient not taking: Reported on 03/05/2021 01/30/21   Bonnielee Haff, MD  phenylephrine (NEO-SYNEPHRINE) 1 % nasal spray Place 1 drop into both nostrils every 6 (six) hours as needed for congestion. Patient not taking: No sig reported 12/09/20   Cristal Deer, MD  polycarbophil (FIBERCON) 625 MG tablet Take 1 tablet (625 mg total) by mouth 2 (two) times daily. Patient not taking: No sig reported 01/13/21   Rai, Vernelle Emerald, MD  pramipexole (MIRAPEX) 1 MG tablet Take 1 tablet (1 mg total)  by mouth daily. 03/05/21   Denita Lung, MD  predniSONE (DELTASONE) 10 MG tablet Take 20 mg (2 tabs of 38m each) twice daily for 2 weeks. Decrease prednisone dose by 10 mg every 2 weeks. 01/30/21   KBonnielee Haff MD  venlafaxine XR (EFFEXOR-XR) 150 MG 24 hr capsule Take 2 capsules (300 mg total) by mouth daily with breakfast. 03/05/21   LDenita Lung MD    Allergies    Iodine  Review of Systems   Review of Systems  Constitutional: Negative for chills and fever.  HENT: Negative for ear pain and sore throat.   Eyes: Negative for pain and visual disturbance.  Respiratory: Negative for cough and shortness of breath.   Cardiovascular: Negative for chest pain and palpitations.  Gastrointestinal: Negative for abdominal pain, nausea and vomiting.  Genitourinary: Negative for dysuria and hematuria.  Musculoskeletal: Negative for arthralgias and back pain.  Skin: Negative for color change and rash.  Neurological: Negative for seizures and syncope.  Psychiatric/Behavioral: Positive for confusion and sleep disturbance. Negative for suicidal ideas.  All other systems reviewed and are negative.   Physical Exam Updated Vital Signs BP 121/87 (BP Location: Right Arm)   Pulse 86   Temp 97.7 F (36.5 C) (Oral)   Resp 15   LMP 06/10/2011 (Exact Date)   SpO2 98%   Physical Exam Vitals and nursing note reviewed.  HENT:     Head: Normocephalic and atraumatic.  Eyes:     General: No scleral icterus. Pulmonary:     Effort: Pulmonary effort is normal. No respiratory distress.  Abdominal:     General: Abdomen is flat.     Comments: osteomy uncomplicated, greenish stool  Musculoskeletal:     Cervical back: Normal range of motion.  Skin:    General: Skin is warm and dry.  Neurological:     General: No focal deficit present.     Mental Status: She is alert.  Psychiatric:        Attention and Perception: Attention and perception normal. She does not perceive auditory or visual  hallucinations.        Mood and Affect: Mood normal. Affect is angry and tearful.        Speech: Speech normal.        Behavior: Behavior normal. Behavior is cooperative.  Thought Content: Thought content normal. Thought content is not paranoid or delusional. Thought content does not include homicidal or suicidal ideation.        Cognition and Memory: Cognition and memory normal.        Judgment: Judgment normal.     ED Results / Procedures / Treatments   Labs (all labs ordered are listed, but only abnormal results are displayed) Labs Reviewed  URINALYSIS, ROUTINE W REFLEX MICROSCOPIC  RAPID URINE DRUG SCREEN, HOSP PERFORMED    EKG EKG Interpretation  Date/Time:  Wednesday Mar 13 2021 13:21:02 EDT Ventricular Rate:  108 PR Interval:  122 QRS Duration: 80 QT Interval:  298 QTC Calculation: 399 R Axis:   -55 Text Interpretation: Sinus tachycardia Left anterior fascicular block Anterior infarct , age undetermined Abnormal ECG Similar to prior Confirmed by Lorre Munroe ([id number]) on 03/13/2021 3:30:04 PM   Radiology DG Chest 2 View  Result Date: 03/12/2021 CLINICAL DATA:  Confusion and history of recent bowel resection, initial encounter EXAM: CHEST - 2 VIEW COMPARISON:  05/09/2026 FINDINGS: The heart size and mediastinal contours are within normal limits. Both lungs are clear. The visualized skeletal structures are unremarkable. IMPRESSION: No active cardiopulmonary disease. Electronically Signed   By: Inez Catalina M.D.   On: 03/12/2021 16:40   CT Head Wo Contrast  Result Date: 03/12/2021 CLINICAL DATA:  Mental status change EXAM: CT HEAD WITHOUT CONTRAST TECHNIQUE: Contiguous axial images were obtained from the base of the skull through the vertex without intravenous contrast. COMPARISON:  None. FINDINGS: Brain: No acute territorial infarction, hemorrhage or intracranial mass. Mild atrophy. Minimal white matter hypodensity. Nonenlarged ventricles Vascular: No hyperdense vessels.   No unexpected calcification Skull: Normal. Negative for fracture or focal lesion. Sinuses/Orbits: No acute finding. Other: None IMPRESSION: 1. No CT evidence for acute intracranial abnormality. 2. Atrophy and mild chronic small vessel ischemic change of the white matter Electronically Signed   By: Donavan Foil M.D.   On: 03/12/2021 16:27    Procedures Procedures   Medications Ordered in ED Medications  pantoprazole (PROTONIX) EC tablet 80 mg (has no administration in time range)  losartan-hydrochlorothiazide (HYZAAR) 100-12.5 MG per tablet 1 tablet (has no administration in time range)  pramipexole (MIRAPEX) tablet 1 mg (has no administration in time range)  venlafaxine XR (EFFEXOR-XR) 24 hr capsule 300 mg (has no administration in time range)    ED Course  I have reviewed the triage vital signs and the nursing notes.  Pertinent labs & imaging results that were available during my care of the patient were reviewed by me and considered in my medical decision making (see chart for details).    MDM Rules/Calculators/A&P                          Cynthia [name] is very coherent and is able to provide her entire history.  She is tearful and angry about being taken to the emergency department.  She is struggling with her recent need for an ostomy, and certainly, she has a history of past struggles with substance use.  However, she is not endorsing any current hallucinations, and she also is not endorsing any homicidal or suicidal ideations.  I do not think she meets criteria for involuntary commitment.  She has been thoroughly evaluated in the emergency department about 24 hours ago, and I do not think that further labs or imaging would add to the clinical picture.  I do think it is  possible that her recent surgery, her drastic change in physical appearance and bodily functions, her period of time without her Effexor, and her lack of sleep could all be contributing to some of the mental status  changes noted by her sister.  The patient has agreed to be evaluated by behavioral health.  Home medication was ordered. Final Clinical Impression(s) / ED Diagnoses Final diagnoses:  Insomnia, unspecified type  Unspecified severe protein-calorie malnutrition ([place])  Crohn's disease with complication, unspecified gastrointestinal tract location [facility])    Rx / DC Orders ED Discharge Orders    None       Arnaldo Natal, MD 03/13/21 [phone]

## 2021-03-13 NOTE — ED Provider Notes (Addendum)
Emergency Medicine Provider Triage Evaluation Note  Cynthia [name] 60 y.o. female was evaluated in triage.  Pt complains of episodes of confusion.  Patient was initially dropped off by her sister behavioral health urgent care earlier today for intermittent confusion.  Patient was sent over to the ED for further evaluation.  Patient states that since she had surgery for an ostomy bag about 3 weeks ago, she has had "episodes of differences in reality."  She states that "things that I think should happen have not and things have happened I think have."  She states that an example of this that she feels like she is has money in the bank when she goes to get money, there is none there.  Her sister took her to the ED yesterday where she had work-up that was unremarkable.  Patient denies any drug use.  She denies any SI, HI.   Review of Systems  Positive: Confusion Negative: Chest pain, difficulty breathing, abdominal pain, nausea/vomiting.  Physical Exam  BP 134/82   Pulse 70   Temp 98.2 F (36.8 C) (Oral)   Resp 18   Ht 5' 4"  (1.626 m)   Wt 65.8 kg   SpO2 100%   BMI 24.89 kg/m  Gen:   Awake, no distress   HEENT:  Atraumatic  Resp:  Normal effort  Cardiac:  Normal rate  Abd:   Nondistended, nontender.  Ostomy bag noted. MSK:   Moves extremities without difficulty  Neuro:  Speech clear   Other:   Alert and oriented x3.  Able to answer my questions without any difficulty.  Medical Decision Making  Medically screening exam initiated at 1:28 PM  Appropriate orders placed.  BETSAIDA MISSOURI was informed that the remainder of the evaluation will be completed by another provider, this initial triage assessment does not replace that evaluation, and the importance of remaining in the ED until their evaluation is complete.  Patient reports she just had blood work done at [facility] that was unremarkable.  She at this time wishes to hold off on any blood work until she is seen in the back by a  provider.  At this time, patient is alert and oriented x3 and able to answer all my questions.  She is not suicidal, homicidal.  She is voluntary to be here.  There is no indication that she needs IVC at this time.  Clinical Impression  Confusion    Portions of this note were generated with Dragon dictation software. Dictation errors may occur despite best attempts at proofreading.     Volanda Napoleon, PA-C 03/13/21 1330    Volanda Napoleon, PA-C 03/13/21 1331    Arnaldo Natal, MD 03/13/21 [phone]

## 2021-03-13 NOTE — ED Provider Notes (Addendum)
I have spoken with Corene Cornea, the nurse practitioner on that psychiatry this evening, he also looks at the notes and agrees that this patient needs to be inpatient psych admission, she is currently calm and redirectable, awaiting placement per their recommendations.  Per Sharl Ma with NP psych - pt is currently needing psych placment - they are working on location   Noemi Chapel, MD 03/13/21 2213    Noemi Chapel, MD 03/13/21 2243

## 2021-03-13 NOTE — ED Triage Notes (Signed)
Pt BIB EMS from [facility] due to confusion.Pt was seen at [facility] for same.

## 2021-03-14 DIAGNOSIS — F4325 Adjustment disorder with mixed disturbance of emotions and conduct: Secondary | ICD-10-CM

## 2021-03-14 DIAGNOSIS — F4320 Adjustment disorder, unspecified: Secondary | ICD-10-CM

## 2021-03-14 LAB — RESP PANEL BY RT-PCR (FLU A&B, COVID) ARPGX2
Influenza A by PCR: NEGATIVE
Influenza B by PCR: NEGATIVE
SARS Coronavirus 2 by RT PCR: NEGATIVE

## 2021-03-14 NOTE — ED Notes (Addendum)
Report given to [facility]. Patients belongings placed in locker #6 (little locker)

## 2021-03-14 NOTE — ED Notes (Signed)
Ambulated to restroom at this time with steady gait

## 2021-03-14 NOTE — ED Notes (Signed)
Pt dressing to abdomen changed. Colostomy bag appears clean and intact. Pt states she changes the bag every 3 days and was changed yesterday. Waiting for psych to talk to pt.

## 2021-03-14 NOTE — ED Notes (Signed)
Pt talking to psych at this time via TTS.

## 2021-03-14 NOTE — ED Notes (Signed)
Pt ambulated well to the bathroom to clean her ostomy bag.

## 2021-03-14 NOTE — ED Provider Notes (Addendum)
I assumed care of the patient at 0 700.  Briefly the patient is a 60 year old female who had presented at behavioral health urgent care and was deemed to need acute psychiatric hospitalization and was sent here to await placement.  The patient is irate and is angry that she is being held against her will.  She is demanding to be discharged immediately and told me that she is contacted her attorney to try and figure out how to get her out of the hospital.  She seems to be able to make her own decisions and has some linear understanding of what is happened though she adamantly refuses that she was evaluated by mental health professional yesterday, tells me that she never had a conversation that told her that she would have to stay in the hospital.  She also refuses to stay in acute psychiatric hospital.  After having a long discussion with her about this at length I discussed with her about having an reassessment by psychiatry to see if they felt she would be able to have outpatient therapy as a viable option or if she needs acute hospitalization.  At that time it may be required to fill out involuntary commitment paperwork.      Deno Etienne, DO 03/14/21 0935   Cleared by psych.  Discharge home.   Deno Etienne, DO 03/14/21 1406

## 2021-03-14 NOTE — ED Notes (Signed)
Pt is wanting to leave. MD wants psych to evaluate pt via TTS. Attempting to contact psych at this time.

## 2021-03-14 NOTE — ED Notes (Signed)
Pt daughter called regarding discharge dispo. Pending dispo from psych at this time.

## 2021-03-14 NOTE — ED Notes (Signed)
Pt states psych cleared her to go home. Pt spoke to Beatriz Stallion, [place] with psych. No notes at this time from provider. Provider notified regarding dispo. Waiting for reply from provider.

## 2021-03-14 NOTE — ED Notes (Signed)
Received verbal report from Elias-Fela Solis at this time

## 2021-03-14 NOTE — Progress Notes (Signed)
Patient meets inpatient criteria per Dr. Candie Chroman.    No appropriate beds within [facility], pt faxed out at this time.   Patient was referred to the following facilities:   Service Provider Address Phone Fax  [facility]  [address], [place] [address] [phone]  [facility]  [address] [place] [address] [phone] [facility]  [address] [place] [address] [phone] [facility]  [address] [place] [address] [phone] [facility]  [address] [place], [place] [address] [phone] [place] [address] [phone]  [facility]  [address] [place] [address] [phone] [facility]  [place], [place] [address] [phone]  [facility]  [address] [place] [address] [place]  [facility]  [address], [place] [address] [phone] [facility]  [address] [place], [place] [address] [place]  [facility]  [address] [place] [address] [place]  [facility]  [address], [place] [address] [phone]  [facility]  [place] [address] [place] [address] [phone]  [facility]  [address], [place] [address] [phone] [place]  [address], [place] [address] [phone] [place]  [address], [place] [address] [phone] [facility]  [address], [place]  [address] [phone] [place]  [address], [place] [address] [phone] [facility]  [address], [place] [address] [phone] [facility]  [address] [place] [address] [phone]  [facility]  [address] [place] [address] [phone]  [facility]  [place], [place] [address] [place]  [facility]  [address], [place] [address] [phone]  [facility]  [place], [place] [address] [phone]  [facility]  [address] [place] [address] [place]  [facility]  [address], [place] [address] [phone] [facility]  [address] [place] [address] [phone] [place]  [address] [place] [address] [phone] [place], [place] [address] [phone]  [facility]  [address] [place] [address] [phone]     CSW will continue to monitor for disposition.  Assunta Curtis, MSW, LCSW 03/14/2021 10:22 AM

## 2021-03-14 NOTE — ED Notes (Signed)
Patient resting at this time. Chest rise and fall noted. No complaints at this time.

## 2021-03-14 NOTE — ED Notes (Signed)
Pt wants to leave at this time. MD notified. MD at bedside talking to pt. Pt is not IVC. Alert and oriented at this time. Safety precautions maintained.
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
## 2021-03-14 NOTE — Consult Note (Signed)
Telepsych Consultation   Reason for Consult:  Psychiatry Reassessment Referring Physician:   Location of Patient:  [facility] Location of Provider: [facility]  Patient Identification: Cynthia [name] MRN:  [id number] Principal Diagnosis: Adjustment disorder Diagnosis:  Principal Problem:   Adjustment disorder Active Problems:   Unspecified severe protein-calorie malnutrition ([place])   Total Time spent with patient: 30 minutes  Subjective:   Cynthia [name] is a 60 y.o. female patient.  She reports readiness to discharge home.  She states "my sister brought me in because my head has been muddled since surgery 3 weeks ago."  Cynthia [name] believes this may have resulted from missed doses of Effexor.  She reports she did not receive Effexor for approximately 1 month after surgery, restarted this medication approximately 5 days ago.  She states "I think the whole problem was Effexor, the more I take my Effexor the better I feel."  She reports she has been taking Effexor for 16 years, this medication is prescribed by her primary care provider.  HPI:   Patient reassessed by nurse practitioner.  She is alert and oriented, answers appropriately.  She is pleasant and cooperative during assessment.  She is insightful regarding treatment plan.  She states "after surgery I went to stay with my sister for 2 weeks because I was not allowed to drive or be alone but the 2 weeks is up."  She reports she has a history of crack cocaine use that her sister was not aware of prior to her coming to reside at her sister's home.  She reports once her sister, Cynthia [name], became aware of history of drug use Cynthia [name] is no longer welcome in [place] home.   Cynthia [name] reports she is not followed by outpatient psychiatry, Effexor is refilled by primary care provider.  She saw counseling briefly 17 years ago after the death of her husband.  She is willing to follow-up with outpatient psychiatry  moving forward.  [name] resides in [place].  She denies access to weapons.  She is not currently employed.  She endorses decreased sleep and average appetite.  She denies alcohol and substance use currently.  She reports she used crack cocaine daily for approximately 5 years, last use 6 months ago.  She reports she stopped using cocaine after she realized it was causing worsening of her chronic Crohn's disease.  Patient offered support and encouragement.  She gives verbal consent to call her daughter Cynthia [name] phone number [phone]. Spoke with patient's daughter reports patient has exhibited bizarre behavior several times over the last month including 1 episode of taking off her colostomy bag and not replacing it.  Patient's daughter also reports patient's last use of crack cocaine was actually 5 weeks ago not 6 months ago.  Patient's daughter verbalizes understanding of discharge plan.  Patient's daughter reports she would prefer patient not return to her home as it is currently inhabited by "drug users and drug dealers."  Past Psychiatric History: Depression  Risk to Self:   Denies Risk to Others:   Denies Prior Inpatient Therapy:    Prior Outpatient Therapy:    Past Medical History:  Past Medical History:  Diagnosis Date  . Anemia 2012  . Anxiety   . Bronchitis   . Crohn's ileitis ([place])   . Depression   . GERD (gastroesophageal reflux disease)   . Headache(784.0)    MIGRAINE  . Heart murmur   . Hiatal hernia   . Hypertension   . IBS (irritable  bowel syndrome)   . Incontinence   . MVP (mitral valve prolapse)   . RLS (restless legs syndrome)   . Tubular adenoma of colon 08/2019    Past Surgical History:  Procedure Laterality Date  . ABDOMINAL HYSTERECTOMY  06/17/2011   Procedure: HYSTERECTOMY ABDOMINAL;  Surgeon: Arloa Koh;  Location: [facility];  Service: Gynecology;  Laterality: N/A;  Converted to Abdominal Hysterectomy with lysis of adhesions   . ANTERIOR AND POSTERIOR  REPAIR  02/25/2012   Procedure: ANTERIOR (CYSTOCELE) AND POSTERIOR REPAIR (RECTOCELE);  Surgeon: Daria Pastures, MD;  Location: [facility];  Service: Gynecology;  Laterality: N/A;  ANterior cystocele repair ONLY  . BIOPSY  08/29/2019   Procedure: BIOPSY;  Surgeon: Lavena Bullion, DO;  Location: [facility];  Service: Gastroenterology;;  . BLADDER SUSPENSION  02/25/2012   Procedure: TRANSVAGINAL TAPE (TVT) PROCEDURE;  Surgeon: Daria Pastures, MD;  Location: [facility];  Service: Gynecology;  Laterality: N/A;  . COLONOSCOPY WITH PROPOFOL N/A 08/29/2019   Procedure: COLONOSCOPY WITH PROPOFOL;  Surgeon: Lavena Bullion, DO;  Location: [facility];  Service: Gastroenterology;  Laterality: N/A;  . CYSTOSCOPY  02/25/2012   Procedure: CYSTOSCOPY;  Surgeon: Daria Pastures, MD;  Location: [facility];  Service: Gynecology;  Laterality: N/A;  . KNEE ARTHROSCOPY  2010  . LAPAROSCOPIC ASSISTED VAGINAL HYSTERECTOMY  06/17/2011   Procedure: LAPAROSCOPIC ASSISTED VAGINAL HYSTERECTOMY;  Surgeon: Arloa Koh;  Location: [facility];  Service: Gynecology;  Laterality: N/A;  Attempted   . POLYPECTOMY  08/29/2019   Procedure: POLYPECTOMY;  Surgeon: Lavena Bullion, DO;  Location: [facility];  Service: Gastroenterology;;  . SALPINGOOPHORECTOMY  06/17/2011   Procedure: SALPINGO OOPHERECTOMY;  Surgeon: Arloa Koh;  Location: [facility];  Service: Gynecology;  Laterality: Bilateral;  . TONSILLECTOMY     Family History:  Family History  Problem Relation Age of Onset  . Diabetes Mother   . Dementia Mother   . Clotting disorder Father   . Macular degeneration Father   . Crohn's disease Sister   . Colon cancer Neg Hx   . Esophageal cancer Neg Hx   . Pancreatic cancer Neg Hx   . Stomach cancer Neg Hx   . Liver disease Neg Hx    Family Psychiatric  History:  None reported Social History:  Social History   Substance and Sexual Activity  Alcohol Use Not Currently     Social History   Substance and  Sexual Activity  Drug Use Yes  . Types: Cocaine   [name]: socially; d/c 3 weeks ago per patient 05/31/20    Social History   Socioeconomic History  . Marital status: Widowed    Spouse name: Not on file  . Number of children: 1  . Years of education: Not on file  . Highest education level: Not on file  Occupational History  . Occupation: Designer, television/film set: [employer]  Tobacco Use  . Smoking status: Current Every Day Smoker    Packs/day: 0.50    Types: Cigarettes  . Smokeless tobacco: Never Used  . Tobacco [name]: none in 2 weeks as of 12/04/20  Vaping Use  . Vaping Use: Former  Substance and Sexual Activity  . Alcohol use: Not Currently  . Drug use: Yes    Types: Cocaine    [name]: socially; d/c 3 weeks ago per patient 05/31/20  . Sexual activity: Not Currently  Other Topics Concern  . Not on file  Social History Narrative  Lives with boyfriend who she states is an alcoholic   Social Determinants of Radio broadcast assistant Strain: Not on file  Food Insecurity: Not on file  Transportation Needs: Not on file  Physical Activity: Not on file  Stress: Not on file  Social Connections: Not on file   Additional Social History:    Allergies:   Allergies  Allergen Reactions  . Iodine     REACTION: in eye gtts Iodine eye drops; pt has no reactions to CT contrast    Labs:  Results for orders placed or performed during the hospital encounter of 03/13/21 (from the past 48 hour(s))  Urinalysis, Routine w reflex microscopic Urine, Clean Catch     Status: Abnormal   Collection Time: 03/13/21  8:13 PM  Result Value Ref Range   Color, Urine AMBER (A) YELLOW    [name]: BIOCHEMICALS MAY BE AFFECTED BY COLOR   APPearance CLOUDY (A) CLEAR   Specific Gravity, Urine 1.025 1.005 - 1.030   pH 5.0 5.0 - 8.0   Glucose, UA NEGATIVE NEGATIVE mg/dL   Hgb urine dipstick NEGATIVE NEGATIVE   Bilirubin Urine NEGATIVE NEGATIVE   Ketones, ur 5 (A) NEGATIVE mg/dL   Protein,  ur 30 (A) NEGATIVE mg/dL   Nitrite NEGATIVE NEGATIVE   Leukocytes,Ua TRACE (A) NEGATIVE   RBC / HPF 0-5 0 - 5 RBC/hpf   WBC, UA 6-10 0 - 5 WBC/hpf   Bacteria, UA RARE (A) NONE SEEN   Squamous Epithelial / LPF 11-20 0 - 5   Mucus PRESENT    Hyaline Casts, UA PRESENT     [name]: Performed at [facility], [address], [place], [place] [address]  Urine rapid drug screen (hosp performed)     Status: None   Collection Time: 03/13/21  8:13 PM  Result Value Ref Range   Opiates NONE DETECTED NONE DETECTED   Cocaine NONE DETECTED NONE DETECTED   Benzodiazepines NONE DETECTED NONE DETECTED   Amphetamines NONE DETECTED NONE DETECTED   Tetrahydrocannabinol NONE DETECTED NONE DETECTED   Barbiturates NONE DETECTED NONE DETECTED    [name]: (NOTE) DRUG SCREEN FOR MEDICAL PURPOSES ONLY.  IF CONFIRMATION IS NEEDED FOR ANY PURPOSE, NOTIFY LAB WITHIN 5 DAYS.  LOWEST DETECTABLE LIMITS FOR URINE DRUG SCREEN Drug Class                     Cutoff (ng/mL) Amphetamine and metabolites    1000 Barbiturate and metabolites    200 Benzodiazepine                 150 Tricyclics and metabolites     300 Opiates and metabolites        300 Cocaine and metabolites        300 THC                            50 Performed at [facility], [place] [address], [place], [place] [address]   Resp Panel by RT-PCR (Flu A&B, Covid) Nasopharyngeal Swab     Status: None   Collection Time: 03/14/21  2:31 AM   Specimen: Nasopharyngeal Swab; Nasopharyngeal(NP) swabs in vial transport medium  Result Value Ref Range   SARS Coronavirus 2 by RT PCR NEGATIVE NEGATIVE    [name]: (NOTE) SARS-CoV-2 target nucleic acids are NOT DETECTED.  The SARS-CoV-2 RNA is generally detectable in upper respiratory specimens during the acute phase of infection. The  lowest concentration of SARS-CoV-2 viral copies this assay can detect is 138 copies/mL. A negative result does not preclude SARS-Cov-2 infection and should not be used  as the sole basis for treatment or other patient management decisions. A negative result may occur with  improper specimen collection/handling, submission of specimen other than nasopharyngeal swab, presence of viral mutation(s) within the areas targeted by this assay, and inadequate number of viral copies(<138 copies/mL). A negative result must be combined with clinical observations, patient history, and epidemiological information. The expected result is Negative.  Fact Sheet for Patients:  EntrepreneurPulse.com.au  Fact Sheet for Healthcare Providers:  IncredibleEmployment.be  This test is no t yet approved or cleared by the [place] FDA and  has been authorized for detection and/or diagnosis of SARS-CoV-2 by FDA under an Emergency Use Authorization (EUA). This EUA will remain  in effect (meaning this test can be used) for the duration of the COVID-19 declaration under Section 564(b)(1) of the Act, 21 U.S.C.section 360bbb-3(b)(1), unless the authorization is terminated  or revoked sooner.       Influenza A by PCR NEGATIVE NEGATIVE   Influenza B by PCR NEGATIVE NEGATIVE    [name]: (NOTE) The Xpert Xpress SARS-CoV-2/FLU/RSV plus assay is intended as an aid in the diagnosis of influenza from Nasopharyngeal swab specimens and should not be used as a sole basis for treatment. Nasal washings and aspirates are unacceptable for Xpert Xpress SARS-CoV-2/FLU/RSV testing.  Fact Sheet for Patients: EntrepreneurPulse.com.au  Fact Sheet for Healthcare Providers: IncredibleEmployment.be  This test is not yet approved or cleared by the [place] FDA and has been authorized for detection and/or diagnosis of SARS-CoV-2 by FDA under an Emergency Use Authorization (EUA). This EUA will remain in effect (meaning this test can be used) for the duration of the COVID-19 declaration under Section 564(b)(1) of the Act,  21 U.S.C. section 360bbb-3(b)(1), unless the authorization is terminated or revoked.  Performed at [facility], [place] [address] [place], [place] [address]     Medications:  Current Facility-Administered Medications  Medication Dose Route Frequency Provider Last Rate Last Admin  . losartan (COZAAR) tablet 100 mg  100 mg Oral Daily Llana Aliment, RPH       And  . hydrochlorothiazide (MICROZIDE) capsule 12.5 mg  12.5 mg Oral Daily Llana Aliment, RPH      . pantoprazole (PROTONIX) EC tablet 80 mg  80 mg Oral Q1200 Arnaldo Natal, MD   80 mg at 03/14/21 1136  . pramipexole (MIRAPEX) tablet 1 mg  1 mg Oral QHS PRN Arnaldo Natal, MD      . venlafaxine XR [facility]) 24 hr capsule 300 mg  300 mg Oral Q breakfast Arnaldo Natal, MD   300 mg at 03/14/21 1014   Current Outpatient Medications  Medication Sig Dispense Refill  . diphenhydramine-acetaminophen (TYLENOL PM) 25-500 MG TABS tablet Take 2 tablets by mouth at bedtime as needed (sleep/pain).    Marland Kitchen esomeprazole (NEXIUM) 40 MG capsule TAKE ONE (1) CAPSULE BY MOUTH 2 TIMES DAILY (Patient taking differently: Take 40 mg by mouth 2 (two) times daily before a meal.) 180 capsule 3  . pramipexole (MIRAPEX) 1 MG tablet Take 1 tablet (1 mg total) by mouth daily. 90 tablet 3  . venlafaxine XR (EFFEXOR-XR) 150 MG 24 hr capsule Take 2 capsules (300 mg total) by mouth daily with breakfast. 90 capsule 3  . losartan-hydrochlorothiazide (HYZAAR) 100-12.5 MG tablet Take 1 tablet by mouth daily. (Patient not taking:  Reported on 03/13/2021) 90 tablet 3  . ondansetron (ZOFRAN-ODT) 4 MG disintegrating tablet Take 1 tablet (4 mg total) by mouth every 8 (eight) hours as needed for nausea or vomiting. (Patient not taking: No sig reported) 20 tablet 0  . phenylephrine (NEO-SYNEPHRINE) 1 % nasal spray Place 1 drop into both nostrils every 6 (six) hours as needed for congestion. (Patient not taking: No sig reported) 30 mL 0  . polycarbophil (FIBERCON) 625 MG  tablet Take 1 tablet (625 mg total) by mouth 2 (two) times daily. (Patient not taking: No sig reported) 120 tablet 2  . predniSONE (DELTASONE) 10 MG tablet Take 20 mg (2 tabs of 6m each) twice daily for 2 weeks. Decrease prednisone dose by 10 mg every 2 weeks. (Patient not taking: Reported on 03/13/2021) 90 tablet 0    Musculoskeletal: Strength & Muscle Tone: within normal limits Gait & Station: normal Patient leans: N/A  Psychiatric Specialty Exam: Physical Exam Vitals and nursing note reviewed.  Constitutional:      Appearance: She is well-developed.  HENT:     Head: Normocephalic.  Cardiovascular:     Rate and Rhythm: Normal rate.  Pulmonary:     Effort: Pulmonary effort is normal.  Neurological:     Mental Status: She is alert and oriented to person, place, and time.  Psychiatric:        Attention and Perception: Attention and perception normal.        Mood and Affect: Mood and affect normal.        Speech: Speech normal.        Behavior: Behavior normal. Behavior is cooperative.        Thought Content: Thought content normal.        Cognition and Memory: Cognition and memory normal.        Judgment: Judgment normal.     Review of Systems  Constitutional: Negative.   HENT: Negative.   Eyes: Negative.   Respiratory: Negative.   Cardiovascular: Negative.   Gastrointestinal: Negative.   Genitourinary: Negative.   Musculoskeletal: Negative.   Skin: Negative.   Neurological: Negative.   Psychiatric/Behavioral: Negative.     Blood pressure 133/89, [name] 98, temperature 98 F (36.7 C), temperature source Oral, resp. rate 15, height 5' 4"  (1.626 m), weight 50.8 kg, last menstrual period 06/10/2011, SpO2 100 %.Body mass index is 19.22 kg/m.  General Appearance: Casual and Fairly Groomed  Eye Contact:  Good  Speech:  Clear and Coherent and Normal Rate  Volume:  Normal  Mood:  Euthymic  Affect:  Appropriate and Congruent  Thought Process:  Coherent, Goal Directed and  Descriptions of Associations: Intact  Orientation:  Full (Time, Place, and Person)  Thought Content:  WDL and Logical  Suicidal Thoughts:  No  Homicidal Thoughts:  No  Memory:  Immediate;   Good Recent;   Good Remote;   Good  Judgement:  Fair  Insight:  Good  Psychomotor Activity:  Normal  Concentration:  Concentration: Good and Attention Span: Good  Recall:  Good  Fund of Knowledge:  Good  Language:  Good  Akathisia:  No  Handed:  Right  AIMS (if indicated):     Assets:  Communication Skills Desire for Improvement Financial Resources/Insurance Housing Intimacy Leisure Time PPontoosucTalents/Skills Transportation  ADL's:  Intact  Cognition:  WNL  Sleep:        Treatment Plan Summary: Plan patient reviewed with Dr LSerafina Mitchell   Patient cleared by psychiatry.  Follow-up  with outpatient psychiatry, resources provided. Continue current medications including: -Venlafaxine XR 300 mg daily  Disposition: No evidence of imminent risk to self or others at present.   Patient does not meet criteria for psychiatric inpatient admission. Supportive therapy provided about ongoing stressors. Discussed crisis plan, support from social network, calling 911, coming to the Emergency Department, and calling Suicide Hotline.  This service was provided via telemedicine using a 2-way, interactive audio and video technology.  Names of all persons participating in this telemedicine service and their role in this encounter. Name: Cynthia [name] Role: Patient  Name: Cynthia [name] telephone Role: Patient's daughter  Name: Beatriz Stallion Role: FNP  Name: Dr. Serafina Mitchell Role: Psychiatry    Lucky Rathke, FNP 03/14/2021 12:56 PM

## 2021-03-14 NOTE — ED Notes (Signed)
Cleared by psych at this time. Pt daughter Eligah East is coming to pick up pt.

## 2021-03-14 NOTE — ED Notes (Signed)
Psych notified for eval at this time. Pending TTS from psych to clear pt.

## 2021-03-20 DIAGNOSIS — R627 Adult failure to thrive: Secondary | ICD-10-CM | POA: Insufficient documentation

## 2021-04-02 ENCOUNTER — Ambulatory Visit: Payer: 59 | Admitting: Physician Assistant

## 2021-04-05 ENCOUNTER — Inpatient Hospital Stay (HOSPITAL_COMMUNITY)
Admission: EM | Admit: 2021-04-05 | Discharge: 2021-04-09 | DRG: 872 | Disposition: A | Discharge status: Home / Self Care | Payer: 59 | Attending: Internal Medicine | Admitting: Internal Medicine

## 2021-04-05 ENCOUNTER — Emergency Department (HOSPITAL_COMMUNITY): Payer: 59

## 2021-04-05 ENCOUNTER — Other Ambulatory Visit: Payer: Self-pay

## 2021-04-05 ENCOUNTER — Encounter (HOSPITAL_COMMUNITY): Payer: Self-pay

## 2021-04-05 DIAGNOSIS — Z79899 Other long term (current) drug therapy: Secondary | ICD-10-CM

## 2021-04-05 DIAGNOSIS — Z6821 Body mass index (BMI) 21.0-21.9, adult: Secondary | ICD-10-CM

## 2021-04-05 DIAGNOSIS — N179 Acute kidney failure, unspecified: Secondary | ICD-10-CM | POA: Diagnosis present

## 2021-04-05 DIAGNOSIS — R21 Rash and other nonspecific skin eruption: Secondary | ICD-10-CM | POA: Diagnosis present

## 2021-04-05 DIAGNOSIS — F419 Anxiety disorder, unspecified: Secondary | ICD-10-CM | POA: Diagnosis present

## 2021-04-05 DIAGNOSIS — K50013 Crohn's disease of small intestine with fistula: Secondary | ICD-10-CM | POA: Diagnosis not present

## 2021-04-05 DIAGNOSIS — F17210 Nicotine dependence, cigarettes, uncomplicated: Secondary | ICD-10-CM | POA: Diagnosis present

## 2021-04-05 DIAGNOSIS — F32A Depression, unspecified: Secondary | ICD-10-CM | POA: Diagnosis present

## 2021-04-05 DIAGNOSIS — L0390 Cellulitis, unspecified: Secondary | ICD-10-CM | POA: Diagnosis not present

## 2021-04-05 DIAGNOSIS — E875 Hyperkalemia: Secondary | ICD-10-CM | POA: Diagnosis present

## 2021-04-05 DIAGNOSIS — L03311 Cellulitis of abdominal wall: Secondary | ICD-10-CM | POA: Diagnosis present

## 2021-04-05 DIAGNOSIS — Z20822 Contact with and (suspected) exposure to covid-19: Secondary | ICD-10-CM | POA: Diagnosis present

## 2021-04-05 DIAGNOSIS — A419 Sepsis, unspecified organism: Principal | ICD-10-CM | POA: Diagnosis present

## 2021-04-05 DIAGNOSIS — I10 Essential (primary) hypertension: Secondary | ICD-10-CM | POA: Diagnosis present

## 2021-04-05 DIAGNOSIS — K219 Gastro-esophageal reflux disease without esophagitis: Secondary | ICD-10-CM | POA: Diagnosis present

## 2021-04-05 DIAGNOSIS — Z832 Family history of diseases of the blood and blood-forming organs and certain disorders involving the immune mechanism: Secondary | ICD-10-CM | POA: Diagnosis not present

## 2021-04-05 DIAGNOSIS — R6881 Early satiety: Secondary | ICD-10-CM | POA: Diagnosis present

## 2021-04-05 DIAGNOSIS — E871 Hypo-osmolality and hyponatremia: Secondary | ICD-10-CM | POA: Diagnosis present

## 2021-04-05 DIAGNOSIS — R630 Anorexia: Secondary | ICD-10-CM | POA: Diagnosis present

## 2021-04-05 DIAGNOSIS — Z833 Family history of diabetes mellitus: Secondary | ICD-10-CM | POA: Diagnosis not present

## 2021-04-05 DIAGNOSIS — K5000 Crohn's disease of small intestine without complications: Secondary | ICD-10-CM | POA: Diagnosis present

## 2021-04-05 DIAGNOSIS — Z91041 Radiographic dye allergy status: Secondary | ICD-10-CM

## 2021-04-05 DIAGNOSIS — G2581 Restless legs syndrome: Secondary | ICD-10-CM | POA: Diagnosis present

## 2021-04-05 HISTORY — DX: Acute kidney failure, unspecified: N17.9

## 2021-04-05 HISTORY — DX: Sepsis, unspecified organism: A41.9

## 2021-04-05 LAB — URINALYSIS, ROUTINE W REFLEX MICROSCOPIC
Bilirubin Urine: NEGATIVE
Glucose, UA: NEGATIVE mg/dL
Hgb urine dipstick: NEGATIVE
Ketones, ur: NEGATIVE mg/dL
Leukocytes,Ua: NEGATIVE
Nitrite: NEGATIVE
Protein, ur: NEGATIVE mg/dL
Specific Gravity, Urine: 1.018 (ref 1.005–1.030)
pH: 5 (ref 5.0–8.0)

## 2021-04-05 LAB — CBC WITH DIFFERENTIAL/PLATELET
Abs Immature Granulocytes: 0.06 10*3/uL (ref 0.00–0.07)
Basophils Absolute: 0 10*3/uL (ref 0.0–0.1)
Basophils Relative: 0 %
Eosinophils Absolute: 0.3 10*3/uL (ref 0.0–0.5)
Eosinophils Relative: 3 %
HCT: 41 % (ref 36.0–46.0)
Hemoglobin: 13 g/dL (ref 12.0–15.0)
Immature Granulocytes: 1 %
Lymphocytes Relative: 27 %
Lymphs Abs: 3.3 10*3/uL (ref 0.7–4.0)
MCH: 30.1 pg (ref 26.0–34.0)
MCHC: 31.7 g/dL (ref 30.0–36.0)
MCV: 94.9 fL (ref 80.0–100.0)
Monocytes Absolute: 0.6 10*3/uL (ref 0.1–1.0)
Monocytes Relative: 5 %
Neutro Abs: 7.8 10*3/uL — ABNORMAL HIGH (ref 1.7–7.7)
Neutrophils Relative %: 64 %
Platelets: 379 10*3/uL (ref 150–400)
RBC: 4.32 MIL/uL (ref 3.87–5.11)
RDW: 13.7 % (ref 11.5–15.5)
WBC: 12.1 10*3/uL — ABNORMAL HIGH (ref 4.0–10.5)
nRBC: 0 % (ref 0.0–0.2)

## 2021-04-05 LAB — PROTIME-INR
INR: 1 (ref 0.8–1.2)
Prothrombin Time: 13 seconds (ref 11.4–15.2)

## 2021-04-05 LAB — LACTIC ACID, PLASMA
Lactic Acid, Venous: 2.7 mmol/L (ref 0.5–1.9)
Lactic Acid, Venous: 1.7 mmol/L (ref 0.5–1.9)

## 2021-04-05 LAB — COMPREHENSIVE METABOLIC PANEL
ALT: 11 U/L (ref 0–44)
AST: 15 U/L (ref 15–41)
Albumin: 3.1 g/dL — ABNORMAL LOW (ref 3.5–5.0)
Alkaline Phosphatase: 45 U/L (ref 38–126)
Anion gap: 9 (ref 5–15)
BUN: 38 mg/dL — ABNORMAL HIGH (ref 8–23)
CO2: 19 mmol/L — ABNORMAL LOW (ref 22–32)
Calcium: 8.5 mg/dL — ABNORMAL LOW (ref 8.9–10.3)
Chloride: 105 mmol/L (ref 98–111)
Creatinine, Ser: 1.28 mg/dL — ABNORMAL HIGH (ref 0.44–1.00)
GFR, Estimated: 48 mL/min — ABNORMAL LOW (ref >60)
Glucose, Bld: 81 mg/dL (ref 70–99)
Potassium: 5.4 mmol/L — ABNORMAL HIGH (ref 3.5–5.1)
Sodium: 133 mmol/L — ABNORMAL LOW (ref 135–145)
Total Bilirubin: 0.5 mg/dL (ref 0.3–1.2)
Total Protein: 6.2 g/dL — ABNORMAL LOW (ref 6.5–8.1)

## 2021-04-05 LAB — RESP PANEL BY RT-PCR (FLU A&B, COVID) ARPGX2
Influenza A by PCR: NEGATIVE
Influenza B by PCR: NEGATIVE
SARS Coronavirus 2 by RT PCR: NEGATIVE

## 2021-04-05 LAB — APTT: aPTT: 27 seconds (ref 24–36)

## 2021-04-05 MED ORDER — VENLAFAXINE HCL ER 150 MG PO CP24
300.0000 mg | ORAL_CAPSULE | Freq: Every day | ORAL | Status: DC
  Administered 2021-04-06 – 2021-04-09 (×4): 300 mg via ORAL
  Filled 2021-04-05 (×4): qty 2

## 2021-04-05 MED ORDER — PRAMIPEXOLE DIHYDROCHLORIDE 1 MG PO TABS
1.0000 mg | ORAL_TABLET | Freq: Every day | ORAL | Status: DC
  Administered 2021-04-06 – 2021-04-09 (×4): 1 mg via ORAL
  Filled 2021-04-05 (×4): qty 1

## 2021-04-05 MED ORDER — CALCIUM POLYCARBOPHIL 625 MG PO TABS
625.0000 mg | ORAL_TABLET | Freq: Two times a day (BID) | ORAL | Status: DC
  Administered 2021-04-06 – 2021-04-09 (×7): 625 mg via ORAL
  Filled 2021-04-05 (×7): qty 1

## 2021-04-05 MED ORDER — SODIUM CHLORIDE 0.9 % IV BOLUS
1000.0000 mL | Freq: Once | INTRAVENOUS | Status: AC
  Administered 2021-04-05: 1000 mL via INTRAVENOUS

## 2021-04-05 MED ORDER — LACTATED RINGERS IV BOLUS (SEPSIS)
500.0000 mL | Freq: Once | INTRAVENOUS | Status: AC
  Administered 2021-04-05: 500 mL via INTRAVENOUS

## 2021-04-05 MED ORDER — SODIUM CHLORIDE 0.9 % IV SOLN
2.0000 g | INTRAVENOUS | Status: DC
  Administered 2021-04-05 – 2021-04-08 (×4): 2 g via INTRAVENOUS
  Filled 2021-04-05: qty 2
  Filled 2021-04-05: qty 20
  Filled 2021-04-05 (×2): qty 2
  Filled 2021-04-05: qty 20

## 2021-04-05 MED ORDER — LACTATED RINGERS IV SOLN: INTRAVENOUS | Status: DC

## 2021-04-05 MED ORDER — HYDROCODONE-ACETAMINOPHEN 5-325 MG PO TABS
1.0000 | ORAL_TABLET | ORAL | Status: DC | PRN
  Administered 2021-04-06 – 2021-04-07 (×7): 1 via ORAL
  Administered 2021-04-08: 2 via ORAL
  Filled 2021-04-05: qty 1
  Filled 2021-04-05: qty 2
  Filled 2021-04-05 (×6): qty 1

## 2021-04-05 MED ORDER — ACETAMINOPHEN 650 MG RE SUPP: 650.0000 mg | Freq: Four times a day (QID) | RECTAL | Status: DC | PRN

## 2021-04-05 MED ORDER — PREDNISONE 20 MG PO TABS: 10.0000 mg | ORAL_TABLET | ORAL | Status: DC

## 2021-04-05 MED ORDER — LACTATED RINGERS IV BOLUS (SEPSIS)
1000.0000 mL | Freq: Once | INTRAVENOUS | Status: AC
  Administered 2021-04-05: 1000 mL via INTRAVENOUS

## 2021-04-05 MED ORDER — ENOXAPARIN SODIUM 40 MG/0.4ML IJ SOSY
40.0000 mg | PREFILLED_SYRINGE | INTRAMUSCULAR | Status: DC
  Administered 2021-04-05 – 2021-04-08 (×4): 40 mg via SUBCUTANEOUS
  Filled 2021-04-05 (×4): qty 0.4

## 2021-04-05 MED ORDER — PANTOPRAZOLE SODIUM 40 MG PO TBEC
40.0000 mg | DELAYED_RELEASE_TABLET | Freq: Two times a day (BID) | ORAL | Status: DC
  Administered 2021-04-06 – 2021-04-09 (×7): 40 mg via ORAL
  Filled 2021-04-05 (×7): qty 1

## 2021-04-05 MED ORDER — ONDANSETRON HCL 4 MG PO TABS: 4.0000 mg | ORAL_TABLET | Freq: Four times a day (QID) | ORAL | Status: DC | PRN

## 2021-04-05 MED ORDER — ONDANSETRON HCL 4 MG/2ML IJ SOLN
4.0000 mg | Freq: Four times a day (QID) | INTRAMUSCULAR | Status: DC | PRN
  Administered 2021-04-06: 4 mg via INTRAVENOUS
  Filled 2021-04-05: qty 2

## 2021-04-05 MED ORDER — ACETAMINOPHEN 325 MG PO TABS
650.0000 mg | ORAL_TABLET | Freq: Four times a day (QID) | ORAL | Status: DC | PRN
  Administered 2021-04-05 – 2021-04-07 (×2): 650 mg via ORAL
  Filled 2021-04-05 (×2): qty 2

## 2021-04-05 NOTE — Progress Notes (Signed)
Elink following Code Sepsis. 

## 2021-04-05 NOTE — Sepsis Progress Note (Signed)
Notified bedside nurse of need to draw repeat lactic acid. 

## 2021-04-05 NOTE — ED Provider Notes (Signed)
[facility] DEPT Provider Note   CSN: [id number] Arrival date & time: 04/05/21  1537     History No chief complaint on file.   Cynthia [name] is a 60 y.o. female.  Patient presents with vomiting for 24 hours.  She also has redness on her abdomen near her colostomy site.  Patient has Crohn's disease and had a colostomy put in approximately 6 weeks ago.  Patient states her abdomen does not hurt but her skin is sore   The history is provided by the patient.  Emesis Severity:  Moderate Timing:  Constant Quality:  Bilious material Able to tolerate:  Liquids Progression:  Unchanged Chronicity:  New Recent urination:  Normal Relieved by:  Nothing Worsened by:  Nothing Ineffective treatments:  None tried Associated symptoms: no abdominal pain, no cough, no diarrhea and no headaches        Past Medical History:  Diagnosis Date  . Anemia 2012  . Anxiety   . Bronchitis   . Crohn's ileitis ([place])   . Depression   . GERD (gastroesophageal reflux disease)   . Headache(784.0)    MIGRAINE  . Heart murmur   . Hiatal hernia   . Hypertension   . IBS (irritable bowel syndrome)   . Incontinence   . MVP (mitral valve prolapse)   . RLS (restless legs syndrome)   . Tubular adenoma of colon 08/2019    Patient Active Problem List   Diagnosis Date Noted  . Sepsis due to cellulitis ([place]) 04/05/2021  . Adjustment disorder 03/14/2021  . Unspecified severe protein-calorie malnutrition ([place]) 03/13/2021  . COVID-19 01/25/2021  . Leukocytosis 01/24/2021  . Crohn's colitis, with intestinal obstruction ([place]) 01/24/2021  . 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 History of rectal polyp 2020 01/12/2021  . Anxiety associated with depression 01/12/2021  . Hypokalemia 12/04/2020  . Cough 11/27/2020  . Otalgia of both ears 11/27/2020  . Body aches 11/27/2020  . Fever 11/27/2020  . High risk medication use 11/27/2020  . Crohn's  disease of ileum with fistulas ([place]) 06/27/2020  . 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 Crohn's ileitis, with intestinal obstruction ([place]) 10/05/2019  . Colitis   . Rectal polyp   . 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  . RLS (restless legs syndrome) 09/25/2014  . Current smoker 09/19/2011  . Migraine headache 09/19/2011  . Essential hypertension 09/19/2011  . Obesity (BMI 30-39.9) 09/19/2011  . History of serrated colonic polyp 01/13/2011  . Gastroesophageal reflux disease 03/17/2008  . MVP (mitral valve prolapse) 03/17/2008  . HIATAL HERNIA 05/03/2002    Past Surgical History:  Procedure Laterality Date  . ABDOMINAL HYSTERECTOMY  06/17/2011   Procedure: HYSTERECTOMY ABDOMINAL;  Surgeon: Arloa Koh;  Location: [facility];  Service: Gynecology;  Laterality: N/A;  Converted to Abdominal Hysterectomy with lysis of adhesions   . ANTERIOR AND POSTERIOR REPAIR  02/25/2012   Procedure: ANTERIOR (CYSTOCELE) AND POSTERIOR REPAIR (RECTOCELE);  Surgeon: Daria Pastures, MD;  Location: [facility];  Service: Gynecology;  Laterality: N/A;  ANterior cystocele repair ONLY  . BIOPSY  08/29/2019   Procedure: BIOPSY;  Surgeon: Lavena Bullion, DO;  Location: [facility];  Service: Gastroenterology;;  . BLADDER SUSPENSION  02/25/2012   Procedure: TRANSVAGINAL TAPE (TVT) PROCEDURE;  Surgeon: Daria Pastures, MD;  Location: [facility];  Service: Gynecology;  Laterality: N/A;  . COLONOSCOPY WITH PROPOFOL N/A 08/29/2019  Procedure: COLONOSCOPY WITH PROPOFOL;  Surgeon: Lavena Bullion, DO;  Location: [facility];  Service: Gastroenterology;  Laterality: N/A;  . CYSTOSCOPY  02/25/2012   Procedure: CYSTOSCOPY;  Surgeon: Daria Pastures, MD;  Location: [facility];  Service: Gynecology;  Laterality: N/A;  . ILEOSTOMY    . KNEE ARTHROSCOPY  2010  . LAPAROSCOPIC ASSISTED VAGINAL HYSTERECTOMY  06/17/2011   Procedure:  LAPAROSCOPIC ASSISTED VAGINAL HYSTERECTOMY;  Surgeon: Arloa Koh;  Location: [facility];  Service: Gynecology;  Laterality: N/A;  Attempted   . POLYPECTOMY  08/29/2019   Procedure: POLYPECTOMY;  Surgeon: Lavena Bullion, DO;  Location[facility];  Service: Gastroenterology;;  . SALPINGOOPHORECTOMY  06/17/2011   Procedure: SALPINGO OOPHERECTOMY;  Surgeon: Arloa Koh;  Location: [facility];  Service: Gynecology;  Laterality: Bilateral;  . TONSILLECTOMY       OB History   No obstetric history on file.     Family History  Problem Relation Age of Onset  . Diabetes Mother   . Dementia Mother   . Clotting disorder Father   . Macular degeneration Father   . Crohn's disease Sister   . Colon cancer Neg Hx   . Esophageal cancer Neg Hx   . Pancreatic cancer Neg Hx   . Stomach cancer Neg Hx   . Liver disease Neg Hx     Social History   Tobacco Use  . Smoking status: Current Some Day Smoker    Packs/day: 0.00    Types: Cigarettes  . Smokeless tobacco: Never Used  . Tobacco comment: none in 2 weeks as of 12/04/20  Vaping Use  . Vaping Use: Former  Substance Use Topics  . Alcohol use: Not Currently  . Drug use: Not Currently    Types: Cocaine    Comment: clean x 8 weeks    Home Medications Prior to Admission medications   Medication Sig Start Date End Date Taking? Authorizing Provider  acetaminophen (TYLENOL) 500 MG tablet Take 2,000 mg by mouth every 6 (six) hours as needed for moderate pain.   Yes [provider]  diphenhydramine-acetaminophen (TYLENOL PM) 25-500 MG TABS tablet Take 2 tablets by mouth at bedtime as needed (sleep/pain).   Yes [provider]  esomeprazole (NEXIUM) 40 MG capsule TAKE ONE (1) CAPSULE BY MOUTH 2 TIMES DAILY Patient taking differently: Take 40 mg by mouth 2 (two) times daily before a meal. 03/05/21  Yes Denita Lung, MD  losartan-hydrochlorothiazide (HYZAAR) 100-12.5 MG tablet Take 1 tablet by mouth daily. 08/03/20  Yes Denita Lung, MD  polycarbophil (FIBERCON) 625 MG tablet Take 1 tablet (625 mg total) by mouth 2 (two) times daily. 01/13/21  Yes Rai, Ripudeep K, MD  pramipexole (MIRAPEX) 1 MG tablet Take 1 tablet (1 mg total) by mouth daily. 03/05/21  Yes Denita Lung, MD  venlafaxine XR (EFFEXOR-XR) 150 MG 24 hr capsule Take 2 capsules (300 mg total) by mouth daily with breakfast. 03/05/21  Yes Denita Lung, MD  loperamide (IMODIUM) 2 MG capsule Take 2 mg by mouth daily as needed for diarrhea or loose stools. 03/23/21   [provider]  ondansetron (ZOFRAN-ODT) 4 MG disintegrating tablet Take 1 tablet (4 mg total) by mouth every 8 (eight) hours as needed for nausea or vomiting. Patient not taking: No sig reported 01/30/21   Bonnielee Haff, MD  phenylephrine (NEO-SYNEPHRINE) 1 % nasal spray Place 1 drop into both nostrils every 6 (six) hours as needed for congestion. Patient not taking: No sig  reported 12/09/20   Cristal Deer, MD  predniSONE (DELTASONE) 10 MG tablet Take 20 mg (2 tabs of 73m each) twice daily for 2 weeks. Decrease prednisone dose by 10 mg every 2 weeks. Patient not taking: No sig reported 01/30/21   KBonnielee Haff MD    Allergies    Iodine  Review of Systems   Review of Systems  Constitutional: Negative for appetite change and fatigue.  HENT: Negative for congestion, ear discharge and sinus pressure.   Eyes: Negative for discharge.  Respiratory: Negative for cough.   Cardiovascular: Negative for chest pain.  Gastrointestinal: Positive for vomiting. Negative for abdominal pain and diarrhea.  Genitourinary: Negative for frequency and hematuria.  Musculoskeletal: Negative for back pain.  Skin: Positive for rash.  Neurological: Negative for seizures and headaches.  Psychiatric/Behavioral: Negative for hallucinations.    Physical Exam Updated Vital Signs BP 106/66   Pulse (!) 111   Temp 97.9 F (36.6 C) (Oral)   Resp 16   Ht 5' 4"  (1.626 m)   Wt 49.9 kg   LMP 06/10/2011  (Exact Date)   SpO2 100%   BMI 18.88 kg/m   Physical Exam Vitals and nursing note reviewed.  Constitutional:      Appearance: She is well-developed.  HENT:     Head: Normocephalic.     Nose: Nose normal.  Eyes:     General: No scleral icterus.    Conjunctiva/sclera: Conjunctivae normal.  Neck:     Thyroid: No thyromegaly.  Cardiovascular:     Rate and Rhythm: Normal rate and regular rhythm.     Heart sounds: No murmur heard. No friction rub. No gallop.   Pulmonary:     Breath sounds: No stridor. No wheezing or rales.  Chest:     Chest wall: No tenderness.  Abdominal:     General: There is no distension.     Tenderness: There is no abdominal tenderness. There is no rebound.  Musculoskeletal:        General: Normal range of motion.     Cervical back: Neck supple.  Lymphadenopathy:     Cervical: No cervical adenopathy.  Skin:    Findings: Rash present. No erythema.     Comments: Patient has redness and tenderness on her abdominal wall near her colostomy site.  Possible cellulitis  Neurological:     Mental Status: She is alert and oriented to person, place, and time.     Motor: No abnormal muscle tone.     Coordination: Coordination normal.  Psychiatric:        Behavior: Behavior normal.     ED Results / Procedures / Treatments   Labs (all labs ordered are listed, but only abnormal results are displayed) Labs Reviewed  LACTIC ACID, PLASMA - Abnormal; Notable for the following components:      Result Value   Lactic Acid, Venous 2.7 (*)    All other components within normal limits  CBC WITH DIFFERENTIAL/PLATELET - Abnormal; Notable for the following components:   WBC 12.1 (*)    Neutro Abs 7.8 (*)    All other components within normal limits  COMPREHENSIVE METABOLIC PANEL - Abnormal; Notable for the following components:   Sodium 133 (*)    Potassium 5.4 (*)    CO2 19 (*)    BUN 38 (*)    Creatinine, Ser 1.28 (*)    Calcium 8.5 (*)    Total Protein 6.2 (*)     Albumin 3.1 (*)    GFR,  Estimated 48 (*)    All other components within normal limits  RESP PANEL BY RT-PCR (FLU A&B, COVID) ARPGX2  CULTURE, BLOOD (ROUTINE X 2)  CULTURE, BLOOD (ROUTINE X 2)  URINE CULTURE  LACTIC ACID, PLASMA  PROTIME-INR  APTT  URINALYSIS, ROUTINE W REFLEX MICROSCOPIC    EKG None  Radiology CT ABDOMEN PELVIS WO CONTRAST  Result Date: 04/05/2021 CLINICAL DATA:  Abdominal pain, evaluate for cellulitis, recent ostomy for Crohn's disease EXAM: CT ABDOMEN AND PELVIS WITHOUT CONTRAST TECHNIQUE: Multidetector CT imaging of the abdomen and pelvis was performed following the standard protocol without IV contrast. COMPARISON:  01/08/2021 FINDINGS: Lower chest: No acute abnormality. Hepatobiliary: No solid liver abnormality is seen. Faintly calcified gallstones and/or sludge in the gallbladder. Gallbladder wall thickening, or biliary dilatation. Pancreas: Unremarkable. No pancreatic ductal dilatation or surrounding inflammatory changes. Spleen: Normal in size without significant abnormality. Adrenals/Urinary Tract: Adrenal glands are unremarkable. Kidneys are normal, without renal calculi, solid lesion, or hydronephrosis. Bladder is unremarkable. Stomach/Bowel: Stomach is within normal limits. Appendix is surgically absent. Interval rectosigmoid resection with rectal stump, creation of transverse colon diverting ostomy in the right upper quadrant and left lower quadrant end colostomy. No evidence of acute bowel wall thickening, distention, or inflammatory changes. Vascular/Lymphatic: Aortic atherosclerosis. No enlarged abdominal or pelvic lymph nodes. Reproductive: Status post hysterectomy. Other: No abdominal wall hernia or abnormality. No abdominopelvic ascites. Musculoskeletal: No acute or significant osseous findings. IMPRESSION: 1. Interval rectosigmoid resection with rectal stump, creation of transverse colon diverting ostomy in the right upper quadrant and left lower quadrant end  colostomy. 2. No noncontrast evidence of postoperative fluid collection or other complication. 3. No evidence of bowel obstruction. 4. Faintly calcified gallstones and/or sludge in the gallbladder without evidence of acute cholecystitis. Aortic Atherosclerosis (ICD10-I70.0). Electronically Signed   By: Eddie Candle M.D.   On: 04/05/2021 19:29   DG Chest Port 1 View  Result Date: 04/05/2021 CLINICAL DATA:  Redness surrounding new stoma on abdomen, ostomy performed 03/25/2021, nausea and single episode of vomiting, question sepsis. History smoking, hypertension EXAM: PORTABLE CHEST 1 VIEW COMPARISON:  Portable exam 1632 hours compared to 03/12/2021 FINDINGS: Normal heart size, mediastinal contours, and pulmonary vascularity. Lungs hyperinflated but clear. No infiltrate, pleural effusion, or pneumothorax. No acute osseous findings. IMPRESSION: Hyperinflated lungs without acute infiltrate. Electronically Signed   By: Lavonia Dana M.D.   On: 04/05/2021 16:43    Procedures Procedures   Medications Ordered in ED Medications  lactated ringers infusion (has no administration in time range)  cefTRIAXone (ROCEPHIN) 2 g in sodium chloride 0.9 % 100 mL IVPB (0 g Intravenous Stopped 04/05/21 1817)  sodium chloride 0.9 % bolus 1,000 mL (has no administration in time range)  lactated ringers bolus 1,000 mL (0 mLs Intravenous Stopped 04/05/21 1842)    And  lactated ringers bolus 500 mL (0 mLs Intravenous Stopped 04/05/21 2000)    ED Course  I have reviewed the triage vital signs and the nursing notes.  Pertinent labs & imaging results that were available during my care of the patient were reviewed by me and considered in my medical decision making (see chart for details).    MDM Rules/Calculators/A&P                         Patient with an AKI and possible cellulitis to the abdomen.  She will be admitted for IV fluids and antibiotics Final Clinical Impression(s) / ED Diagnoses Final diagnoses:  AKI (acute  kidney injury) [facility])    Rx / [place] Orders ED Discharge Orders    None       Milton Ferguson, MD 04/05/21 2059

## 2021-04-05 NOTE — ED Notes (Signed)
ED TO INPATIENT HANDOFF REPORT  Name/Age/Gender Cynthia [name] 60 y.o. female  Code Status Code Status History    Date Active Date Inactive Code Status Order ID Comments User Context   01/25/2021 0031 01/30/2021 1850 Full Code [id number]  Chotiner, Yevonne Aline, MD Inpatient   01/08/2021 1300 01/13/2021 1638 Full Code [id number]  Jonnie Finner, DO Inpatient   12/04/2020 2304 12/09/2020 1609 Full Code [id number]  Lenore Cordia, MD ED   06/27/2020 1218 07/03/2020 1848 Full Code [id number]  Donne Hazel, MD ED   08/26/2019 0410 08/29/2019 2106 Full Code [id number]  Rise Patience, MD ED   Advance Care Planning Activity    Questions for Most Recent Historical Code Status (Order [id number])         Advance Directive Documentation   Flowsheet Row Most Recent Value  Type of Advance Directive Healthcare Power of Attorney  Pre-existing out of facility DNR order (yellow form or pink MOST form) --  "MOST" Form in Place? --      Home/SNF/Other Home  Chief Complaint Sepsis due to cellulitis ([place]) [L03.90, A41.9]  Level of Care/Admitting Diagnosis ED Disposition    ED Disposition Condition Mobile City: [place] [[id number]]  Level of Care: Telemetry [5]  Admit to tele based on following criteria: Monitor QTC interval  May admit patient to [facility] or [facility] if equivalent level of care is available:: Yes  Covid Evaluation: Confirmed COVID Negative  Diagnosis: Sepsis due to cellulitis [facility]) [[id number]]  Admitting Physician: Vianne Bulls [[id number]]  Attending Physician: Vianne Bulls [[id number]]  Estimated length of stay: past midnight tomorrow  Certification:: I certify this patient will need inpatient services for at least 2 midnights       Medical History Past Medical History:  Diagnosis Date  . Anemia 2012  . Anxiety   . Bronchitis   . Crohn's ileitis ([place])   . Depression   . GERD (gastroesophageal reflux disease)   .  Headache(784.0)    MIGRAINE  . Heart murmur   . Hiatal hernia   . Hypertension   . IBS (irritable bowel syndrome)   . Incontinence   . MVP (mitral valve prolapse)   . RLS (restless legs syndrome)   . Tubular adenoma of colon 08/2019    Allergies Allergies  Allergen Reactions  . Iodine     REACTION: in eye gtts Iodine eye drops; pt has no reactions to CT contrast    IV Location/Drains/Wounds Patient Lines/Drains/Airways Status    Active Line/Drains/Airways    Name Placement date Placement time Site Days   Peripheral IV 04/05/21 20 G Left Hand 04/05/21  1742  Hand  less than 1          Labs/Imaging Results for orders placed or performed during the hospital encounter of 04/05/21 (from the past 48 hour(s))  Resp Panel by RT-PCR (Flu A&B, Covid) Nasopharyngeal Swab     Status: None   Collection Time: 04/05/21  4:12 PM   Specimen: Nasopharyngeal Swab; Nasopharyngeal(NP) swabs in vial transport medium  Result Value Ref Range   SARS Coronavirus 2 by RT PCR NEGATIVE NEGATIVE    Comment: (NOTE) SARS-CoV-2 target nucleic acids are NOT DETECTED.  The SARS-CoV-2 RNA is generally detectable in upper respiratory specimens during the acute phase of infection. The lowest concentration of SARS-CoV-2 viral copies this assay can detect is 138 copies/mL. A negative result does not preclude SARS-Cov-2 infection and should not be  used as the sole basis for treatment or other patient management decisions. A negative result may occur with  improper specimen collection/handling, submission of specimen other than nasopharyngeal swab, presence of viral mutation(s) within the areas targeted by this assay, and inadequate number of viral copies(<138 copies/mL). A negative result must be combined with clinical observations, patient history, and epidemiological information. The expected result is Negative.  Fact Sheet for Patients:  EntrepreneurPulse.com.au  Fact Sheet for  Healthcare Providers:  IncredibleEmployment.be  This test is no t yet approved or cleared by the [place] FDA and  has been authorized for detection and/or diagnosis of SARS-CoV-2 by FDA under an Emergency Use Authorization (EUA). This EUA will remain  in effect (meaning this test can be used) for the duration of the COVID-19 declaration under Section 564(b)(1) of the Act, 21 U.S.C.section 360bbb-3(b)(1), unless the authorization is terminated  or revoked sooner.       Influenza A by PCR NEGATIVE NEGATIVE   Influenza B by PCR NEGATIVE NEGATIVE    Comment: (NOTE) The Xpert Xpress SARS-CoV-2/FLU/RSV plus assay is intended as an aid in the diagnosis of influenza from Nasopharyngeal swab specimens and should not be used as a sole basis for treatment. Nasal washings and aspirates are unacceptable for Xpert Xpress SARS-CoV-2/FLU/RSV testing.  Fact Sheet for Patients: EntrepreneurPulse.com.au  Fact Sheet for Healthcare Providers: IncredibleEmployment.be  This test is not yet approved or cleared by the [place] FDA and has been authorized for detection and/or diagnosis of SARS-CoV-2 by FDA under an Emergency Use Authorization (EUA). This EUA will remain in effect (meaning this test can be used) for the duration of the COVID-19 declaration under Section 564(b)(1) of the Act, 21 U.S.C. section 360bbb-3(b)(1), unless the authorization is terminated or revoked.  Performed at [facility], [place] [address], [place], [place] [address]   Lactic acid, plasma     Status: Abnormal   Collection Time: 04/05/21  4:12 PM  Result Value Ref Range   Lactic Acid, Venous 2.7 (HH) 0.5 - 1.9 mmol/L    Comment: CRITICAL RESULT CALLED TO, READ BACK BY AND VERIFIED WITH: S.CLAPP, RN AT 1840 ON 06.03.22 BY N.THOMPSON Performed at [facility] [address] [place], [place] [address]   CBC WITH  DIFFERENTIAL     Status: Abnormal   Collection Time: 04/05/21  5:38 PM  Result Value Ref Range   WBC 12.1 (H) 4.0 - 10.5 K/uL   RBC 4.32 3.87 - 5.11 MIL/uL   Hemoglobin 13.0 12.0 - 15.0 g/dL   HCT 41.0 36.0 - 46.0 %   MCV 94.9 80.0 - 100.0 fL   MCH 30.1 26.0 - 34.0 pg   MCHC 31.7 30.0 - 36.0 g/dL   RDW 13.7 11.5 - 15.5 %   Platelets 379 150 - 400 K/uL   nRBC 0.0 0.0 - 0.2 %   Neutrophils Relative % 64 %   Neutro Abs 7.8 (H) 1.7 - 7.7 K/uL   Lymphocytes Relative 27 %   Lymphs Abs 3.3 0.7 - 4.0 K/uL   Monocytes Relative 5 %   Monocytes Absolute 0.6 0.1 - 1.0 K/uL   Eosinophils Relative 3 %   Eosinophils Absolute 0.3 0.0 - 0.5 K/uL   Basophils Relative 0 %   Basophils Absolute 0.0 0.0 - 0.1 K/uL   Immature Granulocytes 1 %   Abs Immature Granulocytes 0.06 0.00 - 0.07 K/uL    Comment: Performed at [facility], [place] [address] [place],  [place] [address]  Protime-INR     Status: None   Collection Time: 04/05/21  5:38 PM  Result Value Ref Range   Prothrombin Time 13.0 11.4 - 15.2 seconds   INR 1.0 0.8 - 1.2    Comment: (NOTE) INR goal varies based on device and disease states. Performed at [facility], [place] [address], [place], [place] [address]   APTT     Status: None   Collection Time: 04/05/21  5:38 PM  Result Value Ref Range   aPTT 27 24 - 36 seconds    Comment: Performed at [facility], [place] [address] [place], [place] [address]  Lactic acid, plasma     Status: None   Collection Time: 04/05/21  6:12 PM  Result Value Ref Range   Lactic Acid, Venous 1.7 0.5 - 1.9 mmol/L    Comment: Performed at [facility], [place] [address], [place], [place] [address]  Comprehensive metabolic panel     Status: Abnormal   Collection Time: 04/05/21  7:18 PM  Result Value Ref Range   Sodium 133 (L) 135 - 145 mmol/L   Potassium 5.4 (H) 3.5 - 5.1 mmol/L   Chloride 105 98 - 111 mmol/L   CO2 19 (L) 22 - 32 mmol/L    Glucose, Bld 81 70 - 99 mg/dL    Comment: Glucose reference range applies only to samples taken after fasting for at least 8 hours.   BUN 38 (H) 8 - 23 mg/dL   Creatinine, Ser 1.28 (H) 0.44 - 1.00 mg/dL   Calcium 8.5 (L) 8.9 - 10.3 mg/dL   Total Protein 6.2 (L) 6.5 - 8.1 g/dL   Albumin 3.1 (L) 3.5 - 5.0 g/dL   AST 15 15 - 41 U/L   ALT 11 0 - 44 U/L   Alkaline Phosphatase 45 38 - 126 U/L   Total Bilirubin 0.5 0.3 - 1.2 mg/dL   GFR, Estimated 48 (L) >60 mL/min    Comment: (NOTE) Calculated using the CKD-EPI Creatinine Equation (2021)    Anion gap 9 5 - 15    Comment: Performed at [facility], [place] [address], [place], [place] [address]   CT ABDOMEN PELVIS WO CONTRAST  Result Date: 04/05/2021 CLINICAL DATA:  Abdominal pain, evaluate for cellulitis, recent ostomy for Crohn's disease EXAM: CT ABDOMEN AND PELVIS WITHOUT CONTRAST TECHNIQUE: Multidetector CT imaging of the abdomen and pelvis was performed following the standard protocol without IV contrast. COMPARISON:  01/08/2021 FINDINGS: Lower chest: No acute abnormality. Hepatobiliary: No solid liver abnormality is seen. Faintly calcified gallstones and/or sludge in the gallbladder. Gallbladder wall thickening, or biliary dilatation. Pancreas: Unremarkable. No pancreatic ductal dilatation or surrounding inflammatory changes. Spleen: Normal in size without significant abnormality. Adrenals/Urinary Tract: Adrenal glands are unremarkable. Kidneys are normal, without renal calculi, solid lesion, or hydronephrosis. Bladder is unremarkable. Stomach/Bowel: Stomach is within normal limits. Appendix is surgically absent. Interval rectosigmoid resection with rectal stump, creation of transverse colon diverting ostomy in the right upper quadrant and left lower quadrant end colostomy. No evidence of acute bowel wall thickening, distention, or inflammatory changes. Vascular/Lymphatic: Aortic atherosclerosis. No enlarged abdominal or pelvic lymph  nodes. Reproductive: Status post hysterectomy. Other: No abdominal wall hernia or abnormality. No abdominopelvic ascites. Musculoskeletal: No acute or significant osseous findings. IMPRESSION: 1. Interval rectosigmoid resection with rectal stump, creation of transverse colon diverting ostomy in the right upper quadrant and left lower quadrant end colostomy. 2. No noncontrast evidence of postoperative fluid  collection or other complication. 3. No evidence of bowel obstruction. 4. Faintly calcified gallstones and/or sludge in the gallbladder without evidence of acute cholecystitis. Aortic Atherosclerosis (ICD10-I70.0). Electronically Signed   By: Eddie Candle M.D.   On: 04/05/2021 19:29   DG Chest Port 1 View  Result Date: 04/05/2021 CLINICAL DATA:  Redness surrounding new stoma on abdomen, ostomy performed 03/25/2021, nausea and single episode of vomiting, question sepsis. History smoking, hypertension EXAM: PORTABLE CHEST 1 VIEW COMPARISON:  Portable exam 1632 hours compared to 03/12/2021 FINDINGS: Normal heart size, mediastinal contours, and pulmonary vascularity. Lungs hyperinflated but clear. No infiltrate, pleural effusion, or pneumothorax. No acute osseous findings. IMPRESSION: Hyperinflated lungs without acute infiltrate. Electronically Signed   By: Lavonia Dana M.D.   On: 04/05/2021 16:43    Pending Labs Unresulted Labs (From admission, onward)          Start     Ordered   04/05/21 1612  Blood Culture (routine x 2)  (Septic presentation on arrival (screening labs, nursing and treatment orders for obvious sepsis))  BLOOD CULTURE X 2,   STAT      04/05/21 1612   04/05/21 1612  Urinalysis, Routine w reflex microscopic  (Septic presentation on arrival (screening labs, nursing and treatment orders for obvious sepsis))  ONCE - STAT,   STAT        04/05/21 1612   04/05/21 1612  Urine culture  (Septic presentation on arrival (screening labs, nursing and treatment orders for obvious sepsis))  ONCE -  STAT,   STAT        04/05/21 1612          Vitals/Pain Today's Vitals   04/05/21 1915 04/05/21 2000 04/05/21 2030 04/05/21 2100  BP: 104/71 97/65 106/66 108/66  Pulse: (!) 115 (!) 118 (!) 111 (!) 120  Resp: (!) 9 (!) 8 16 14   Temp:    98.1 F (36.7 C)  TempSrc:    Oral  SpO2: 100% 98% 100% 100%  Weight:      Height:      PainSc:        Isolation Precautions No active isolations  Medications Medications  lactated ringers infusion (has no administration in time range)  cefTRIAXone (ROCEPHIN) 2 g in sodium chloride 0.9 % 100 mL IVPB (0 g Intravenous Stopped 04/05/21 1817)  sodium chloride 0.9 % bolus 1,000 mL (has no administration in time range)  lactated ringers bolus 1,000 mL (0 mLs Intravenous Stopped 04/05/21 1842)    And  lactated ringers bolus 500 mL (0 mLs Intravenous Stopped 04/05/21 2000)    Mobility walks with person assist

## 2021-04-05 NOTE — ED Provider Notes (Signed)
Emergency Medicine Provider Triage Evaluation Note  Cynthia [name] , a 60 y.o. female  was evaluated in triage.  Pt complains of skin infection.  Review of Systems  Positive: abd pain, skin changes on abdomen, ran out of ostomy bag, chills, n/v Negative: Fever, headache  Physical Exam  BP 102/77 (BP Location: Left Arm)   Pulse (!) 122   Temp 97.9 F (36.6 C) (Oral)   Resp 14   Ht 5' 4"  (1.626 m)   Wt 49.9 kg   LMP 06/10/2011 (Exact Date)   SpO2 100%   BMI 18.88 kg/m  Gen:   Awake, no distress   Resp:  Normal effort  MSK:   Moves extremities without difficulty  Other:  Erythema throughout abdominal wall.  Ostomy missing ostomy bag  Medical Decision Making  Medically screening exam initiated at 4:12 PM.  Appropriate orders placed.  Cynthia [name] was informed that the remainder of the evaluation will be completed by another provider, this initial triage assessment does not replace that evaluation, and the importance of remaining in the ED until their evaluation is complete.  Pt noticed redness and pain to her abdominal wall for nearly a weak.  Ran out of ostomy bag today.  Having abd pain, chills, n/v.     Domenic Moras, PA-C 04/05/21 1614    Milton Ferguson, MD 04/05/21 2252

## 2021-04-05 NOTE — ED Notes (Signed)
Pt. Placed on pure wick at bedside per RN, Alyssa.

## 2021-04-05 NOTE — H&P (Addendum)
History and Physical    Cynthia [name]:[id number] DOB: 03-05-1961 DOA: 04/05/2021  PCP: Denita Lung, MD   Patient coming from: Home   Chief Complaint: Redness around ostomy, chills, N/V   HPI: Cynthia [name] is a 60 y.o. female with medical history significant for Crohn's disease status post sigmoidectomy in April 2022, depression, anxiety, and hypertension, now presenting to the emergency department for evaluation of redness around her ostomy.  Patient reports noticing some erythema involving the abdominal wall around her ostomy approximately 1 week ago and states that this has progressively worsened.  She also reports chills, nausea, and vomiting for the past couple days.  She has been experiencing early satiety and not eating much for at least a week.  Patient states that she is prescribed prednisone taper but has not been taking this.  She reports that she was staying at her parents house, was getting a ride back to her house today for ostomy supplies, but her son-in-law who is a paramedic recommended that she be evaluated in the emergency department for the worsening.  On her abdominal wall.  ED Course: Upon arrival to the ED, patient is found to be afebrile, saturating well on room air, tachycardic to 120, and with blood pressure of 90/69.  EKG features sinus tachycardia.  Chemistry panel notable for sodium 133, potassium 5.4, and creatinine 1.28.  CBC with leukocytosis to 12,100.  Initial lactic acid 2.7.  CT of the abdomen and pelvis demonstrates interval rectosigmoid resection without evidence for fluid collection or other complication.  Blood cultures were collected and the patient was given 1.5 L of LR and 2 g IV Rocephin.  Review of Systems:  All other systems reviewed and apart from HPI, are negative.  Past Medical History:  Diagnosis Date  . Anemia 2012  . Anxiety   . Bronchitis   . Crohn's ileitis ([place])   . Depression   . GERD (gastroesophageal reflux disease)    . Headache(784.0)    MIGRAINE  . Heart murmur   . Hiatal hernia   . Hypertension   . IBS (irritable bowel syndrome)   . Incontinence   . MVP (mitral valve prolapse)   . RLS (restless legs syndrome)   . Tubular adenoma of colon 08/2019    Past Surgical History:  Procedure Laterality Date  . ABDOMINAL HYSTERECTOMY  06/17/2011   Procedure: HYSTERECTOMY ABDOMINAL;  Surgeon: Arloa Koh;  Location: [facility];  Service: Gynecology;  Laterality: N/A;  Converted to Abdominal Hysterectomy with lysis of adhesions   . ANTERIOR AND POSTERIOR REPAIR  02/25/2012   Procedure: ANTERIOR (CYSTOCELE) AND POSTERIOR REPAIR (RECTOCELE);  Surgeon: Daria Pastures, MD;  Location: [facility];  Service: Gynecology;  Laterality: N/A;  ANterior cystocele repair ONLY  . BIOPSY  08/29/2019   Procedure: BIOPSY;  Surgeon: Lavena Bullion, DO;  Location: [facility];  Service: Gastroenterology;;  . BLADDER SUSPENSION  02/25/2012   Procedure: TRANSVAGINAL TAPE (TVT) PROCEDURE;  Surgeon: Daria Pastures, MD;  Location: [facility];  Service: Gynecology;  Laterality: N/A;  . COLONOSCOPY WITH PROPOFOL N/A 08/29/2019   Procedure: COLONOSCOPY WITH PROPOFOL;  Surgeon: Lavena Bullion, DO;  Location: [facility];  Service: Gastroenterology;  Laterality: N/A;  . CYSTOSCOPY  02/25/2012   Procedure: CYSTOSCOPY;  Surgeon: Daria Pastures, MD;  Location: [facility];  Service: Gynecology;  Laterality: N/A;  . ILEOSTOMY    . KNEE ARTHROSCOPY  2010  . LAPAROSCOPIC ASSISTED VAGINAL HYSTERECTOMY  06/17/2011  Procedure: LAPAROSCOPIC ASSISTED VAGINAL HYSTERECTOMY;  Surgeon: Arloa Koh;  Location: [facility];  Service: Gynecology;  Laterality: N/A;  Attempted   . POLYPECTOMY  08/29/2019   Procedure: POLYPECTOMY;  Surgeon: Lavena Bullion, DO;  Location: [facility];  Service: Gastroenterology;;  . SALPINGOOPHORECTOMY  06/17/2011   Procedure: SALPINGO OOPHERECTOMY;  Surgeon: Arloa Koh;  Location: [facility];  Service: Gynecology;   Laterality: Bilateral;  . TONSILLECTOMY      Social History:   reports that she has been smoking cigarettes. She has been smoking about 0.00 packs per day. She has never used smokeless tobacco. She reports previous alcohol use. She reports previous drug use. Drug: Cocaine.  Allergies  Allergen Reactions  . Iodine     REACTION: in eye gtts Iodine eye drops; pt has no reactions to CT contrast    Family History  Problem Relation Age of Onset  . Diabetes Mother   . Dementia Mother   . Clotting disorder Father   . Macular degeneration Father   . Crohn's disease Sister   . Colon cancer Neg Hx   . Esophageal cancer Neg Hx   . Pancreatic cancer Neg Hx   . Stomach cancer Neg Hx   . Liver disease Neg Hx      Prior to Admission medications   Medication Sig Start Date End Date Taking? Authorizing Provider  acetaminophen (TYLENOL) 500 MG tablet Take 2,000 mg by mouth every 6 (six) hours as needed for moderate pain.   Yes [provider]  diphenhydramine-acetaminophen (TYLENOL PM) 25-500 MG TABS tablet Take 2 tablets by mouth at bedtime as needed (sleep/pain).   Yes [provider]  esomeprazole (NEXIUM) 40 MG capsule TAKE ONE (1) CAPSULE BY MOUTH 2 TIMES DAILY Patient taking differently: Take 40 mg by mouth 2 (two) times daily before a meal. 03/05/21  Yes Denita Lung, MD  losartan-hydrochlorothiazide (HYZAAR) 100-12.5 MG tablet Take 1 tablet by mouth daily. 08/03/20  Yes Denita Lung, MD  polycarbophil (FIBERCON) 625 MG tablet Take 1 tablet (625 mg total) by mouth 2 (two) times daily. 01/13/21  Yes Rai, Ripudeep K, MD  pramipexole (MIRAPEX) 1 MG tablet Take 1 tablet (1 mg total) by mouth daily. 03/05/21  Yes Denita Lung, MD  venlafaxine XR (EFFEXOR-XR) 150 MG 24 hr capsule Take 2 capsules (300 mg total) by mouth daily with breakfast. 03/05/21  Yes Denita Lung, MD  loperamide (IMODIUM) 2 MG capsule Take 2 mg by mouth daily as needed for diarrhea or loose stools.  03/23/21   [provider]  ondansetron (ZOFRAN-ODT) 4 MG disintegrating tablet Take 1 tablet (4 mg total) by mouth every 8 (eight) hours as needed for nausea or vomiting. Patient not taking: No sig reported 01/30/21   Bonnielee Haff, MD  phenylephrine (NEO-SYNEPHRINE) 1 % nasal spray Place 1 drop into both nostrils every 6 (six) hours as needed for congestion. Patient not taking: No sig reported 12/09/20   Cristal Deer, MD  predniSONE (DELTASONE) 10 MG tablet Take 20 mg (2 tabs of 58m each) twice daily for 2 weeks. Decrease prednisone dose by 10 mg every 2 weeks. Patient not taking: No sig reported 01/30/21   KBonnielee Haff MD    Physical Exam: Vitals:   04/05/21 1915 04/05/21 2000 04/05/21 2030 04/05/21 2100  BP: 104/71 97/65 106/66 108/66  Pulse: (!) 115 (!) 118 (!) 111 (!) 120  Resp: (!) 9 (!) 8 16 14   Temp:    98.1 F (36.7  C)  TempSrc:    Oral  SpO2: 100% 98% 100% 100%  Weight:      Height:        Constitutional: NAD, calm  Eyes: PERTLA, lids and conjunctivae normal ENMT: Mucous membranes are moist. Posterior pharynx clear of any exudate or lesions.   Neck: supple, no masses  Respiratory:  no wheezing, no crackles. No accessory muscle use.  Cardiovascular: S1 & S2 heard, regular rate and rhythm. No extremity edema. No significant JVD. Abdomen: No distension, soft. Bowel sounds active.  Musculoskeletal: no clubbing / cyanosis. No joint deformity upper and lower extremities.   Skin: Erythema involving abdominal wall without fluctuance or drainage. Warm, dry, well-perfused. Neurologic: CN 2-12 grossly intact. Sensation intact. Moving all extremities.  Psychiatric: Alert and oriented to person, place, and situation. Calm and cooperative.    Labs and Imaging on Admission: I have personally reviewed following labs and imaging studies  CBC: Recent Labs  Lab 04/05/21 1738  WBC 12.1*  NEUTROABS 7.8*  HGB 13.0  HCT 41.0  MCV 94.9  PLT 440   Basic Metabolic  Panel: Recent Labs  Lab 04/05/21 1918  NA 133*  K 5.4*  CL 105  CO2 19*  GLUCOSE 81  BUN 38*  CREATININE 1.28*  CALCIUM 8.5*   GFR: Estimated Creatinine Clearance: 36.4 mL/min (A) (by C-G formula based on SCr of 1.28 mg/dL (H)). Liver Function Tests: Recent Labs  Lab 04/05/21 1918  AST 15  ALT 11  ALKPHOS 45  BILITOT 0.5  PROT 6.2*  ALBUMIN 3.1*   No results for input(s): LIPASE, AMYLASE in the last 168 hours. No results for input(s): AMMONIA in the last 168 hours. Coagulation Profile: Recent Labs  Lab 04/05/21 1738  INR 1.0   Cardiac Enzymes: No results for input(s): CKTOTAL, CKMB, CKMBINDEX, TROPONINI in the last 168 hours. BNP (last 3 results) No results for input(s): PROBNP in the last 8760 hours. HbA1C: No results for input(s): HGBA1C in the last 72 hours. CBG: No results for input(s): GLUCAP in the last 168 hours. Lipid Profile: No results for input(s): CHOL, HDL, LDLCALC, TRIG, CHOLHDL, LDLDIRECT in the last 72 hours. Thyroid Function Tests: No results for input(s): TSH, T4TOTAL, FREET4, T3FREE, THYROIDAB in the last 72 hours. Anemia Panel: No results for input(s): VITAMINB12, FOLATE, FERRITIN, TIBC, IRON, RETICCTPCT in the last 72 hours. Urine analysis:    Component Value Date/Time   COLORURINE AMBER (A) 03/13/2021 2013   APPEARANCEUR CLOUDY (A) 03/13/2021 2013   LABSPEC 1.025 03/13/2021 2013   LABSPEC 1,025 12/18/2020 [place] 5.0 03/13/2021 2013   GLUCOSEU NEGATIVE 03/13/2021 2013   HGBUR NEGATIVE 03/13/2021 2013   BILIRUBINUR NEGATIVE 03/13/2021 2013   BILIRUBINUR small (A) 12/18/2020 1644   BILIRUBINUR n 12/04/2011 1014   KETONESUR 5 (A) 03/13/2021 2013   PROTEINUR 30 (A) 03/13/2021 2013   UROBILINOGEN negative 12/04/2011 1014   NITRITE NEGATIVE 03/13/2021 2013   LEUKOCYTESUR TRACE (A) 03/13/2021 2013   Sepsis Labs: @LABRCNTIP (procalcitonin:4,lacticidven:4) ) Recent Results (from the past 240 hour(s))  Resp Panel by RT-PCR (Flu  A&B, Covid) Nasopharyngeal Swab     Status: None   Collection Time: 04/05/21  4:12 PM   Specimen: Nasopharyngeal Swab; Nasopharyngeal(NP) swabs in vial transport medium  Result Value Ref Range Status   SARS Coronavirus 2 by RT PCR NEGATIVE NEGATIVE Final    Comment: (NOTE) SARS-CoV-2 target nucleic acids are NOT DETECTED.  The SARS-CoV-2 RNA is generally detectable in upper respiratory specimens during the acute  phase of infection. The lowest concentration of SARS-CoV-2 viral copies this assay can detect is 138 copies/mL. A negative result does not preclude SARS-Cov-2 infection and should not be used as the sole basis for treatment or other patient management decisions. A negative result may occur with  improper specimen collection/handling, submission of specimen other than nasopharyngeal swab, presence of viral mutation(s) within the areas targeted by this assay, and inadequate number of viral copies(<138 copies/mL). A negative result must be combined with clinical observations, patient history, and epidemiological information. The expected result is Negative.  Fact Sheet for Patients:  EntrepreneurPulse.com.au  Fact Sheet for Healthcare Providers:  IncredibleEmployment.be  This test is no t yet approved or cleared by the [place] FDA and  has been authorized for detection and/or diagnosis of SARS-CoV-2 by FDA under an Emergency Use Authorization (EUA). This EUA will remain  in effect (meaning this test can be used) for the duration of the COVID-19 declaration under Section 564(b)(1) of the Act, 21 U.S.C.section 360bbb-3(b)(1), unless the authorization is terminated  or revoked sooner.       Influenza A by PCR NEGATIVE NEGATIVE Final   Influenza B by PCR NEGATIVE NEGATIVE Final    Comment: (NOTE) The Xpert Xpress SARS-CoV-2/FLU/RSV plus assay is intended as an aid in the diagnosis of influenza from Nasopharyngeal swab specimens  and should not be used as a sole basis for treatment. Nasal washings and aspirates are unacceptable for Xpert Xpress SARS-CoV-2/FLU/RSV testing.  Fact Sheet for Patients: EntrepreneurPulse.com.au  Fact Sheet for Healthcare Providers: IncredibleEmployment.be  This test is not yet approved or cleared by the [place] FDA and has been authorized for detection and/or diagnosis of SARS-CoV-2 by FDA under an Emergency Use Authorization (EUA). This EUA will remain in effect (meaning this test can be used) for the duration of the COVID-19 declaration under Section 564(b)(1) of the Act, 21 U.S.C. section 360bbb-3(b)(1), unless the authorization is terminated or revoked.  Performed at [facility], [place] [address], [place], [place] [address]      Radiological Exams on Admission: CT ABDOMEN PELVIS WO CONTRAST  Result Date: 04/05/2021 CLINICAL DATA:  Abdominal pain, evaluate for cellulitis, recent ostomy for Crohn's disease EXAM: CT ABDOMEN AND PELVIS WITHOUT CONTRAST TECHNIQUE: Multidetector CT imaging of the abdomen and pelvis was performed following the standard protocol without IV contrast. COMPARISON:  01/08/2021 FINDINGS: Lower chest: No acute abnormality. Hepatobiliary: No solid liver abnormality is seen. Faintly calcified gallstones and/or sludge in the gallbladder. Gallbladder wall thickening, or biliary dilatation. Pancreas: Unremarkable. No pancreatic ductal dilatation or surrounding inflammatory changes. Spleen: Normal in size without significant abnormality. Adrenals/Urinary Tract: Adrenal glands are unremarkable. Kidneys are normal, without renal calculi, solid lesion, or hydronephrosis. Bladder is unremarkable. Stomach/Bowel: Stomach is within normal limits. Appendix is surgically absent. Interval rectosigmoid resection with rectal stump, creation of transverse colon diverting ostomy in the right upper quadrant and left lower  quadrant end colostomy. No evidence of acute bowel wall thickening, distention, or inflammatory changes. Vascular/Lymphatic: Aortic atherosclerosis. No enlarged abdominal or pelvic lymph nodes. Reproductive: Status post hysterectomy. Other: No abdominal wall hernia or abnormality. No abdominopelvic ascites. Musculoskeletal: No acute or significant osseous findings. IMPRESSION: 1. Interval rectosigmoid resection with rectal stump, creation of transverse colon diverting ostomy in the right upper quadrant and left lower quadrant end colostomy. 2. No noncontrast evidence of postoperative fluid collection or other complication. 3. No evidence of bowel obstruction. 4. Faintly calcified gallstones and/or sludge in the gallbladder without evidence of acute  cholecystitis. Aortic Atherosclerosis (ICD10-I70.0). Electronically Signed   By: Eddie Candle M.D.   On: 04/05/2021 19:29   DG Chest Port 1 View  Result Date: 04/05/2021 CLINICAL DATA:  Redness surrounding new stoma on abdomen, ostomy performed 03/25/2021, nausea and single episode of vomiting, question sepsis. History smoking, hypertension EXAM: PORTABLE CHEST 1 VIEW COMPARISON:  Portable exam 1632 hours compared to 03/12/2021 FINDINGS: Normal heart size, mediastinal contours, and pulmonary vascularity. Lungs hyperinflated but clear. No infiltrate, pleural effusion, or pneumothorax. No acute osseous findings. IMPRESSION: Hyperinflated lungs without acute infiltrate. Electronically Signed   By: Lavonia Dana M.D.   On: 04/05/2021 16:43    EKG: Independently reviewed. Sinus tachycardia, rate 120, LAD.   Assessment/Plan   1. Abdominal wall cellulitis  - Presents for evaluation of abdominal wall erythema with subjective fevers and is found to have leukocytosis, tachycardia, elevated lactate, and no underlying abscess or gas on CT  - She may have early sepsis but tachycardia and elevated lactate more likely related to hypovolemia in setting of recent anorexia and  N/V  - Blood cultures collected in ED, lactate normalized with 30 cc/kg LR bolus, and she was started on Rocephin  - Continue Rocephin, follow cultures and clinical course    2. Acute kidney injury; hyperkalemia; hyponatremia  - SCr is 1.28 on admission, up from 0.96 three weeks ago and 0.4 in March 2022 - Kidneys appear normal, no hydronephrosis on CT in ED   - Serum potassium is 5.4 and sodium 133 in ED  - Hold ARB and HCTZ, continue IVF hydration, repeat chem panel in am    3. Crohn disease  - She was admitted to outside hospital in April with fistula and obstruction and underwent sigmoidectomy  - No apparent complication on CT in ED   4. Depression, anxiety  - Continue Effexor   5. Hypertension  - BP low-normal in ED  - Hold antihypertensives for now     DVT prophylaxis: Lovenox  Code Status: Full  Level of Care: Level of care: Telemetry Family Communication: None present  Disposition Plan:  Patient is from: Home  Anticipated d/c is to: Home  Anticipated d/c date is: 04/08/21 Patient currently: Pending improvement in renal function, tolerance of oral diet, improvement in cellulitis  Consults called: None  Admission status: Inpatient     Vianne Bulls, MD [facility]  04/05/2021, 9:31 PM

## 2021-04-05 NOTE — ED Triage Notes (Signed)
Patient reports that she had a new ostomy on 03/25/21. Patient has redness and on her abdomen sourrounding the stoma. Patient does not have any ostomy bag or other supplies.  Patient c/o NV x 1 in the past 24 hours.

## 2021-04-05 NOTE — ED Notes (Signed)
Re collect on Lt green sent to lab.

## 2021-04-06 LAB — CBC
HCT: 30.1 % — ABNORMAL LOW (ref 36.0–46.0)
Hemoglobin: 10.2 g/dL — ABNORMAL LOW (ref 12.0–15.0)
MCH: 30.4 pg (ref 26.0–34.0)
MCHC: 33.9 g/dL (ref 30.0–36.0)
MCV: 89.9 fL (ref 80.0–100.0)
Platelets: UNDETERMINED 10*3/uL (ref 150–400)
RBC: 3.35 MIL/uL — ABNORMAL LOW (ref 3.87–5.11)
RDW: 13.5 % (ref 11.5–15.5)
WBC: 7.1 10*3/uL (ref 4.0–10.5)
nRBC: 0 % (ref 0.0–0.2)

## 2021-04-06 LAB — BASIC METABOLIC PANEL
Anion gap: 4 — ABNORMAL LOW (ref 5–15)
BUN: 30 mg/dL — ABNORMAL HIGH (ref 8–23)
CO2: 24 mmol/L (ref 22–32)
Calcium: 8.4 mg/dL — ABNORMAL LOW (ref 8.9–10.3)
Chloride: 108 mmol/L (ref 98–111)
Creatinine, Ser: 0.93 mg/dL (ref 0.44–1.00)
GFR, Estimated: >60 mL/min (ref >60)
Glucose, Bld: 74 mg/dL (ref 70–99)
Potassium: 4.7 mmol/L (ref 3.5–5.1)
Sodium: 136 mmol/L (ref 135–145)

## 2021-04-06 MED ORDER — LACTATED RINGERS IV BOLUS: 500.0000 mL | Freq: Once | INTRAVENOUS | Status: DC

## 2021-04-06 MED ORDER — VANCOMYCIN HCL 1250 MG/250ML IV SOLN
1250.0000 mg | Freq: Once | INTRAVENOUS | Status: AC
  Administered 2021-04-06: 1250 mg via INTRAVENOUS
  Filled 2021-04-06: qty 250

## 2021-04-06 MED ORDER — BOOST / RESOURCE BREEZE PO LIQD CUSTOM
1.0000 | Freq: Three times a day (TID) | ORAL | Status: DC
  Administered 2021-04-06 – 2021-04-09 (×5): 1 via ORAL

## 2021-04-06 MED ORDER — VANCOMYCIN HCL 1000 MG/200ML IV SOLN
1000.0000 mg | INTRAVENOUS | Status: DC
  Administered 2021-04-07 – 2021-04-08 (×2): 1000 mg via INTRAVENOUS
  Filled 2021-04-06 (×2): qty 200

## 2021-04-06 MED ORDER — LACTATED RINGERS IV SOLN: INTRAVENOUS | Status: DC

## 2021-04-06 MED ORDER — LOPERAMIDE HCL 2 MG PO CAPS
2.0000 mg | ORAL_CAPSULE | Freq: Four times a day (QID) | ORAL | Status: DC | PRN
  Administered 2021-04-06 – 2021-04-07 (×2): 2 mg via ORAL
  Filled 2021-04-06 (×2): qty 1

## 2021-04-06 NOTE — Progress Notes (Signed)
Initial Nutrition Assessment  INTERVENTION:   -Boost Breeze po TID, each supplement provides 250 kcal and 9 grams of protein  NUTRITION DIAGNOSIS:   Increased nutrient needs related to chronic illness as evidenced by estimated needs.  GOAL:   Patient will meet greater than or equal to 90% of their needs  MONITOR:   PO intake,Supplement acceptance,Labs,Weight trends,I & O's  REASON FOR ASSESSMENT:   Malnutrition Screening Tool    ASSESSMENT:   60 y.o. female with medical history significant for Crohn's disease status post sigmoidectomy in April 2022, depression, anxiety, and hypertension presented with redness around ostomy with chills and nausea and vomiting.  Patient currently consuming 100% of meals.  Pt had not been eating well PTA d/t pt N/V. Pt also loses her interest in food quickly even though she feels hungry initially. Will order Boost Breeze for kcals and protein.   Per weight records, pt has lost 18 lbs since 2/2 (12% wt loss x 4 months, significant for time frame).   Medications: Fibercon, Lactated ringers   Labs reviewed.  NUTRITION - FOCUSED PHYSICAL EXAM:  Unable to complete  Diet Order:   Diet Order            Diet Heart Room service appropriate? Yes; Fluid consistency: Thin  Diet effective now                 EDUCATION NEEDS:   No education needs have been identified at this time  Skin:  Skin Assessment: Reviewed RN Assessment  Last BM:     Height:   Ht Readings from Last 1 Encounters:  04/05/21 5' 3"  (1.6 m)    Weight:   Wt Readings from Last 1 Encounters:  04/05/21 55.3 kg    BMI:  Body mass index is 21.6 kg/m.  Estimated Nutritional Needs:   Kcal:  1550-1750  Protein:  60-75g  Fluid:  1.8L/day  Clayton Bibles, MS, RD, LDN Inpatient Clinical Dietitian Contact information available via Amion

## 2021-04-06 NOTE — Progress Notes (Signed)
Patient ID: Cynthia [name], female   DOB: August 09, 1961, 60 y.o.   MRN: [id number]  PROGRESS NOTE    Cynthia [name]  DEY:[id number] DOB: [birth year] DOA: 04/05/2021 PCP: Denita Lung, MD   Brief Narrative:   60 y.o. female with medical history significant for Crohn's disease status post sigmoidectomy in April 2022, depression, anxiety, and hypertension presented with redness around ostomy with chills and nausea and vomiting.  On presentation, she was afebrile but tachycardic.  Creatinine was 1.28 with WBCs of 12,100 and lactic acid of 2.7. CT of the abdomen and pelvis demonstrated interval rectosigmoid resection without evidence for fluid collection or other complication.  She was started on IV fluids and antibiotics.  Assessment & Plan:   Abdominal wall cellulitis around ostomy site Sepsis: Present on admission -Presented with fevers, tachycardia, leukocytosis, elevated lactic acid possibly from abdominal wall cellulitis.  CT of the abdomen showed no fluid collection or other complication -Continue Rocephin.  Add vancomycin.  Decrease IV fluids to 75 cc an hour.  Follow cultures. -Consult wound care  Leukocytosis -Resolved  Acute kidney injury -Improving.  Hyponatremia -Improved  Hyperkalemia -Resolved  History of recent diagnosis of Crohn's disease status post sigmoidectomy in April 2022 - She was admitted to outside hospital in April with fistula and obstruction and underwent sigmoidectomy with ostomy - No apparent complication on CT of the abdomen.  Depression, anxiety -Continue Effexor  Hypertension -Blood pressure on the lower side.  Antihypertensives on hold.  DVT prophylaxis: Lovenox Code Status: Full Family Communication: None at bedside Disposition Plan: Status is: Inpatient  Remains inpatient appropriate because:Inpatient level of care appropriate due to severity of illness   Dispo: The patient is from: Home              Anticipated d/c is to: Home               Patient currently is not medically stable to d/c.   Difficult to place patient No   Consultants: None  Procedures: None  Antimicrobials: Rocephin from 04/04/2021 onwards   Subjective: Patient seen and examined at bedside.  He slept better.  Denies worsening abdominal pain.  No overnight fever or vomiting reported.  Objective: Vitals:   04/05/21 2356 04/06/21 0147 04/06/21 0400 04/06/21 0856  BP: 100/66 109/69 95/64 108/71  Pulse: (!) 111 (!) 109 92 100  Resp: 18 16 16 12   Temp: 98 F (36.7 C) 98.1 F (36.7 C) 98 F (36.7 C) 98.2 F (36.8 C)  TempSrc: Oral Oral Oral Oral  SpO2: 100% 100% 95% 100%  Weight: 55.3 kg     Height: 5' 3"  (1.6 m)       Intake/Output Summary (Last 24 hours) at 04/06/2021 1121 Last data filed at 04/06/2021 0903 Gross per 24 hour  Intake 1595 ml  Output 650 ml  Net 945 ml   Filed Weights   04/05/21 1550 04/05/21 2356  Weight: 49.9 kg 55.3 kg    Examination:  General exam: Appears calm and comfortable.  Currently on room air. Respiratory system: Bilateral decreased breath sounds at bases Cardiovascular system: S1 & S2 heard, intermittently mildly tachycardic Gastrointestinal system: Abdomen is slightly distended, soft and mildly tender around ostomy site with.  Normal bowel sounds heard. Extremities: No cyanosis, clubbing, edema  Central nervous system: Alert and oriented. No focal neurological deficits. Moving extremities Skin: Erythema involving abdominal wall around the ostomy  psychiatry: Judgement and insight appear normal. Mood & affect appropriate.     Data  Reviewed: I have personally reviewed following labs and imaging studies  CBC: Recent Labs  Lab 04/05/21 1738 04/06/21 0711  WBC 12.1* 7.1  NEUTROABS 7.8*  --   HGB 13.0 10.2*  HCT 41.0 30.1*  MCV 94.9 89.9  PLT 379 PLATELET CLUMPS NOTED ON SMEAR, UNABLE TO ESTIMATE   Basic Metabolic Panel: Recent Labs  Lab 04/05/21 1918 04/06/21 0711  NA 133* 136  K 5.4* 4.7   CL 105 108  CO2 19* 24  GLUCOSE 81 74  BUN 38* 30*  CREATININE 1.28* 0.93  CALCIUM 8.5* 8.4*   GFR: Estimated Creatinine Clearance: 52.5 mL/min (by C-G formula based on SCr of 0.93 mg/dL). Liver Function Tests: Recent Labs  Lab 04/05/21 1918  AST 15  ALT 11  ALKPHOS 45  BILITOT 0.5  PROT 6.2*  ALBUMIN 3.1*   No results for input(s): LIPASE, AMYLASE in the last 168 hours. No results for input(s): AMMONIA in the last 168 hours. Coagulation Profile: Recent Labs  Lab 04/05/21 1738  INR 1.0   Cardiac Enzymes: No results for input(s): CKTOTAL, CKMB, CKMBINDEX, TROPONINI in the last 168 hours. BNP (last 3 results) No results for input(s): PROBNP in the last 8760 hours. HbA1C: No results for input(s): HGBA1C in the last 72 hours. CBG: No results for input(s): GLUCAP in the last 168 hours. Lipid Profile: No results for input(s): CHOL, HDL, LDLCALC, TRIG, CHOLHDL, LDLDIRECT in the last 72 hours. Thyroid Function Tests: No results for input(s): TSH, T4TOTAL, FREET4, T3FREE, THYROIDAB in the last 72 hours. Anemia Panel: No results for input(s): VITAMINB12, FOLATE, FERRITIN, TIBC, IRON, RETICCTPCT in the last 72 hours. Sepsis Labs: Recent Labs  Lab 04/05/21 1612 04/05/21 1812  LATICACIDVEN 2.7* 1.7    Recent Results (from the past 240 hour(s))  Resp Panel by RT-PCR (Flu A&B, Covid) Nasopharyngeal Swab     Status: None   Collection Time: 04/05/21  4:12 PM   Specimen: Nasopharyngeal Swab; Nasopharyngeal(NP) swabs in vial transport medium  Result Value Ref Range Status   SARS Coronavirus 2 by RT PCR NEGATIVE NEGATIVE Final    Comment: (NOTE) SARS-CoV-2 target nucleic acids are NOT DETECTED.  The SARS-CoV-2 RNA is generally detectable in upper respiratory specimens during the acute phase of infection. The lowest concentration of SARS-CoV-2 viral copies this assay can detect is 138 copies/mL. A negative result does not preclude SARS-Cov-2 infection and should not be  used as the sole basis for treatment or other patient management decisions. A negative result may occur with  improper specimen collection/handling, submission of specimen other than nasopharyngeal swab, presence of viral mutation(s) within the areas targeted by this assay, and inadequate number of viral copies(<138 copies/mL). A negative result must be combined with clinical observations, patient history, and epidemiological information. The expected result is Negative.  Fact Sheet for Patients:  EntrepreneurPulse.com.au  Fact Sheet for Healthcare Providers:  IncredibleEmployment.be  This test is no t yet approved or cleared by the [place] FDA and  has been authorized for detection and/or diagnosis of SARS-CoV-2 by FDA under an Emergency Use Authorization (EUA). This EUA will remain  in effect (meaning this test can be used) for the duration of the COVID-19 declaration under Section 564(b)(1) of the Act, 21 U.S.C.section 360bbb-3(b)(1), unless the authorization is terminated  or revoked sooner.       Influenza A by PCR NEGATIVE NEGATIVE Final   Influenza B by PCR NEGATIVE NEGATIVE Final    Comment: (NOTE) The Xpert Xpress SARS-CoV-2/FLU/RSV plus assay is  intended as an aid in the diagnosis of influenza from Nasopharyngeal swab specimens and should not be used as a sole basis for treatment. Nasal washings and aspirates are unacceptable for Xpert Xpress SARS-CoV-2/FLU/RSV testing.  Fact Sheet for Patients: EntrepreneurPulse.com.au  Fact Sheet for Healthcare Providers: IncredibleEmployment.be  This test is not yet approved or cleared by the [place] FDA and has been authorized for detection and/or diagnosis of SARS-CoV-2 by FDA under an Emergency Use Authorization (EUA). This EUA will remain in effect (meaning this test can be used) for the duration of the COVID-19 declaration under Section  564(b)(1) of the Act, 21 U.S.C. section 360bbb-3(b)(1), unless the authorization is terminated or revoked.  Performed at [facility], [place] [address] [place], [place] [address]          Radiology Studies: CT ABDOMEN PELVIS WO CONTRAST  Result Date: 04/05/2021 CLINICAL DATA:  Abdominal pain, evaluate for cellulitis, recent ostomy for Crohn's disease EXAM: CT ABDOMEN AND PELVIS WITHOUT CONTRAST TECHNIQUE: Multidetector CT imaging of the abdomen and pelvis was performed following the standard protocol without IV contrast. COMPARISON:  01/08/2021 FINDINGS: Lower chest: No acute abnormality. Hepatobiliary: No solid liver abnormality is seen. Faintly calcified gallstones and/or sludge in the gallbladder. Gallbladder wall thickening, or biliary dilatation. Pancreas: Unremarkable. No pancreatic ductal dilatation or surrounding inflammatory changes. Spleen: Normal in size without significant abnormality. Adrenals/Urinary Tract: Adrenal glands are unremarkable. Kidneys are normal, without renal calculi, solid lesion, or hydronephrosis. Bladder is unremarkable. Stomach/Bowel: Stomach is within normal limits. Appendix is surgically absent. Interval rectosigmoid resection with rectal stump, creation of transverse colon diverting ostomy in the right upper quadrant and left lower quadrant end colostomy. No evidence of acute bowel wall thickening, distention, or inflammatory changes. Vascular/Lymphatic: Aortic atherosclerosis. No enlarged abdominal or pelvic lymph nodes. Reproductive: Status post hysterectomy. Other: No abdominal wall hernia or abnormality. No abdominopelvic ascites. Musculoskeletal: No acute or significant osseous findings. IMPRESSION: 1. Interval rectosigmoid resection with rectal stump, creation of transverse colon diverting ostomy in the right upper quadrant and left lower quadrant end colostomy. 2. No noncontrast evidence of postoperative fluid collection or other  complication. 3. No evidence of bowel obstruction. 4. Faintly calcified gallstones and/or sludge in the gallbladder without evidence of acute cholecystitis. Aortic Atherosclerosis (ICD10-I70.0). Electronically Signed   By: Eddie Candle M.D.   On: 04/05/2021 19:29   DG Chest Port 1 View  Result Date: 04/05/2021 CLINICAL DATA:  Redness surrounding new stoma on abdomen, ostomy performed 03/25/2021, nausea and single episode of vomiting, question sepsis. History smoking, hypertension EXAM: PORTABLE CHEST 1 VIEW COMPARISON:  Portable exam 1632 hours compared to 03/12/2021 FINDINGS: Normal heart size, mediastinal contours, and pulmonary vascularity. Lungs hyperinflated but clear. No infiltrate, pleural effusion, or pneumothorax. No acute osseous findings. IMPRESSION: Hyperinflated lungs without acute infiltrate. Electronically Signed   By: Lavonia Dana M.D.   On: 04/05/2021 16:43        Scheduled Meds: . enoxaparin (LOVENOX) injection  40 mg Subcutaneous Q24H  . pantoprazole  40 mg Oral BID AC  . polycarbophil  625 mg Oral BID  . pramipexole  1 mg Oral Daily  . venlafaxine XR  300 mg Oral Q breakfast   Continuous Infusions: . cefTRIAXone (ROCEPHIN)  IV Stopped (04/05/21 1817)  . lactated ringers 100 mL/hr at 04/05/21 2215          Aline August, MD [facility] 04/06/2021, 11:21 AM

## 2021-04-06 NOTE — Progress Notes (Signed)
Patient is a yellow mews on admission. On call provider (M.Denny) notified. Awaiting orders,

## 2021-04-06 NOTE — Progress Notes (Signed)
Pharmacy Antibiotic Note  Cynthia [name] is a 60 y.o. female with a h/o Crohn's disease s/p sigmoidectomy in April admitted on 04/05/2021 with abdominal wall cellulitis around the ostomy site.  Pharmacy has been consulted for vancomycin dosing.  Plan: Ceftriaxone 2 g IV daily per MD Vancomycin 1250 mg iv once followed by 1000 mg IV Q 24 hrs. Goal AUC 400-550. Expected AUC: 523 SCr used: 0.93  Will f/u renal function, culture results, and check levels if indicated   Height: 5' 3"  (160 cm) Weight: 55.3 kg (121 lb 14.6 oz) IBW/kg (Calculated) : 52.4  Temp (24hrs), Avg:98.1 F (36.7 C), Min:97.9 F (36.6 C), Max:98.2 F (36.8 C)  Recent Labs  Lab 04/05/21 1612 04/05/21 1738 04/05/21 1812 04/05/21 1918 04/06/21 0711  WBC  --  12.1*  --   --  7.1  CREATININE  --   --   --  1.28* 0.93  LATICACIDVEN 2.7*  --  1.7  --   --     Estimated Creatinine Clearance: 52.5 mL/min (by C-G formula based on SCr of 0.93 mg/dL).    Allergies  Allergen Reactions  . Iodine     REACTION: in eye gtts Iodine eye drops; pt has no reactions to CT contrast    Antimicrobials this admission: 6/3 CTX >>  6/4 vancomycin >>   Dose adjustments this admission:  Microbiology results: 6/3 BCx:  6/3 UCx:   6/3 COVID/flu: neg/neg  Thank you for allowing pharmacy to be a part of this patient's care.  Ulice Dash D 04/06/2021 11:54 AM

## 2021-04-06 NOTE — Plan of Care (Signed)

## 2021-04-07 LAB — CBC
HCT: 31.2 % — ABNORMAL LOW (ref 36.0–46.0)
Hemoglobin: 9.8 g/dL — ABNORMAL LOW (ref 12.0–15.0)
MCH: 30.2 pg (ref 26.0–34.0)
MCHC: 31.4 g/dL (ref 30.0–36.0)
MCV: 96 fL (ref 80.0–100.0)
Platelets: 330 10*3/uL (ref 150–400)
RBC: 3.25 MIL/uL — ABNORMAL LOW (ref 3.87–5.11)
RDW: 13.2 % (ref 11.5–15.5)
WBC: 6.2 10*3/uL (ref 4.0–10.5)
nRBC: 0 % (ref 0.0–0.2)

## 2021-04-07 LAB — BASIC METABOLIC PANEL
Anion gap: 6 (ref 5–15)
BUN: 22 mg/dL (ref 8–23)
CO2: 25 mmol/L (ref 22–32)
Calcium: 8.7 mg/dL — ABNORMAL LOW (ref 8.9–10.3)
Chloride: 104 mmol/L (ref 98–111)
Creatinine, Ser: 0.85 mg/dL (ref 0.44–1.00)
GFR, Estimated: >60 mL/min (ref >60)
Glucose, Bld: 75 mg/dL (ref 70–99)
Potassium: 4.1 mmol/L (ref 3.5–5.1)
Sodium: 135 mmol/L (ref 135–145)

## 2021-04-07 LAB — C-REACTIVE PROTEIN: CRP: 0.6 mg/dL (ref <1.0)

## 2021-04-07 LAB — URINE CULTURE: Culture: 2000 — AB

## 2021-04-07 LAB — MAGNESIUM: Magnesium: 1.1 mg/dL — ABNORMAL LOW (ref 1.7–2.4)

## 2021-04-07 MED ORDER — MAGNESIUM SULFATE 4 GM/100ML IV SOLN
4.0000 g | Freq: Once | INTRAVENOUS | Status: AC
  Administered 2021-04-07: 4 g via INTRAVENOUS
  Filled 2021-04-07: qty 100

## 2021-04-07 MED ORDER — DIPHENHYDRAMINE HCL 25 MG PO CAPS
25.0000 mg | ORAL_CAPSULE | Freq: Four times a day (QID) | ORAL | Status: DC | PRN
  Administered 2021-04-07 – 2021-04-08 (×2): 25 mg via ORAL
  Filled 2021-04-07 (×2): qty 1

## 2021-04-07 NOTE — Progress Notes (Signed)
Patient ID: ANALEISE MCCLEERY, female   DOB: 08/28/1961, 60 y.o.   MRN: [id number]  PROGRESS NOTE    BETTYE SITTON  MBW:[id number] DOB: Nov 09, 1960 DOA: 04/05/2021 PCP: Denita Lung, MD   Brief Narrative:   60 y.o. female with medical history significant for Crohn's disease status post sigmoidectomy in April 2022, depression, anxiety, and hypertension presented with redness around ostomy with chills and nausea and vomiting.  On presentation, she was afebrile but tachycardic.  Creatinine was 1.28 with WBCs of 12,100 and lactic acid of 2.7. CT of the abdomen and pelvis demonstrated interval rectosigmoid resection without evidence for fluid collection or other complication.  She was started on IV fluids and antibiotics.  Assessment & Plan:   Abdominal wall cellulitis around ostomy site Sepsis: Present on admission -Presented with fevers, tachycardia, leukocytosis, elevated lactic acid possibly from abdominal wall cellulitis.  CT of the abdomen showed no fluid collection or other complication -Continue Rocephin and vancomycin.  DC IV fluids.  Blood cultures are negative so far. -Wound care consult is pending.  Leukocytosis -Resolved  Acute kidney injury -Resolved with IV fluids.  Hyponatremia -Improved  Hyperkalemia -Resolved  Hypomagnesemia -Replace.  Repeat a.m. labs  History of recent diagnosis of Crohn's disease status post sigmoidectomy in April 2022 - She was admitted to outside hospital in April with fistula and obstruction and underwent sigmoidectomy with ostomy - No apparent complication on CT of the abdomen.  Depression, anxiety -Continue Effexor  Hypertension -Blood pressure on the lower side.  Antihypertensives on hold.  DVT prophylaxis: Lovenox Code Status: Full Family Communication: None at bedside Disposition Plan: Status is: Inpatient  Remains inpatient appropriate because:Inpatient level of care appropriate due to severity of illness   Dispo: The  patient is from: Home              Anticipated d/c is to: Home              Patient currently is not medically stable to d/c.   Difficult to place patient No   Consultants: None  Procedures: None  Antimicrobials: Rocephin from 04/04/2021 onwards.  Vancomycin from 04/06/2021 onwards   Subjective: Patient seen and examined at bedside.  No fever, worsening shortness of breath, vomiting reported.  Feels that the redness around the ostomy site is improving. Objective: Vitals:   04/06/21 1233 04/06/21 1411 04/06/21 2034 04/07/21 0448  BP: 124/86 125/77 115/83 121/86  Pulse: 94 88 86 91  Resp: 12 11 16 15   Temp: 98.4 F (36.9 C) 98.1 F (36.7 C) 98.3 F (36.8 C) 97.9 F (36.6 C)  TempSrc: Oral Oral Oral Oral  SpO2: 100% 100% 100% 100%  Weight:      Height:        Intake/Output Summary (Last 24 hours) at 04/07/2021 0823 Last data filed at 04/07/2021 0733 Gross per 24 hour  Intake 1792.42 ml  Output 1225 ml  Net 567.42 ml   Filed Weights   04/05/21 1550 04/05/21 2356  Weight: 49.9 kg 55.3 kg    Examination:  General exam: On room air currently.  No distress.   Respiratory system: Decreased breath sounds at bases bilaterally Cardiovascular system: Rate controlled, S1-S2 heard Gastrointestinal system: Abdomen is distended mildly, soft and mildly tender around ostomy site with.  Bowel sounds are heard Extremities: No edema or clubbing Central nervous system: Awake and alert.  No focal neurological deficits.  Moves extremities  skin: Still has some erythema and warmth around the ostomy site  psychiatry:  Normal mood, affect and judgment   Data Reviewed: I have personally reviewed following labs and imaging studies  CBC: Recent Labs  Lab 04/05/21 1738 04/06/21 0711 04/07/21 0521  WBC 12.1* 7.1 6.2  NEUTROABS 7.8*  --   --   HGB 13.0 10.2* 9.8*  HCT 41.0 30.1* 31.2*  MCV 94.9 89.9 96.0  PLT 379 PLATELET CLUMPS NOTED ON SMEAR, UNABLE TO ESTIMATE 709   Basic Metabolic  Panel: Recent Labs  Lab 04/05/21 1918 04/06/21 0711 04/07/21 0521  NA 133* 136 135  K 5.4* 4.7 4.1  CL 105 108 104  CO2 19* 24 25  GLUCOSE 81 74 75  BUN 38* 30* 22  CREATININE 1.28* 0.93 0.85  CALCIUM 8.5* 8.4* 8.7*  MG  --   --  1.1*   GFR: Estimated Creatinine Clearance: 57.5 mL/min (by C-G formula based on SCr of 0.85 mg/dL). Liver Function Tests: Recent Labs  Lab 04/05/21 1918  AST 15  ALT 11  ALKPHOS 45  BILITOT 0.5  PROT 6.2*  ALBUMIN 3.1*   No results for input(s): LIPASE, AMYLASE in the last 168 hours. No results for input(s): AMMONIA in the last 168 hours. Coagulation Profile: Recent Labs  Lab 04/05/21 1738  INR 1.0   Cardiac Enzymes: No results for input(s): CKTOTAL, CKMB, CKMBINDEX, TROPONINI in the last 168 hours. BNP (last 3 results) No results for input(s): PROBNP in the last 8760 hours. HbA1C: No results for input(s): HGBA1C in the last 72 hours. CBG: No results for input(s): GLUCAP in the last 168 hours. Lipid Profile: No results for input(s): CHOL, HDL, LDLCALC, TRIG, CHOLHDL, LDLDIRECT in the last 72 hours. Thyroid Function Tests: No results for input(s): TSH, T4TOTAL, FREET4, T3FREE, THYROIDAB in the last 72 hours. Anemia Panel: No results for input(s): VITAMINB12, FOLATE, FERRITIN, TIBC, IRON, RETICCTPCT in the last 72 hours. Sepsis Labs: Recent Labs  Lab 04/05/21 1612 04/05/21 1812  LATICACIDVEN 2.7* 1.7    Recent Results (from the past 240 hour(s))  Resp Panel by RT-PCR (Flu A&B, Covid) Nasopharyngeal Swab     Status: None   Collection Time: 04/05/21  4:12 PM   Specimen: Nasopharyngeal Swab; Nasopharyngeal(NP) swabs in vial transport medium  Result Value Ref Range Status   SARS Coronavirus 2 by RT PCR NEGATIVE NEGATIVE Final    Comment: (NOTE) SARS-CoV-2 target nucleic acids are NOT DETECTED.  The SARS-CoV-2 RNA is generally detectable in upper respiratory specimens during the acute phase of infection. The  lowest concentration of SARS-CoV-2 viral copies this assay can detect is 138 copies/mL. A negative result does not preclude SARS-Cov-2 infection and should not be used as the sole basis for treatment or other patient management decisions. A negative result may occur with  improper specimen collection/handling, submission of specimen other than nasopharyngeal swab, presence of viral mutation(s) within the areas targeted by this assay, and inadequate number of viral copies(<138 copies/mL). A negative result must be combined with clinical observations, patient history, and epidemiological information. The expected result is Negative.  Fact Sheet for Patients:  EntrepreneurPulse.com.au  Fact Sheet for Healthcare Providers:  IncredibleEmployment.be  This test is no t yet approved or cleared by the [place] FDA and  has been authorized for detection and/or diagnosis of SARS-CoV-2 by FDA under an Emergency Use Authorization (EUA). This EUA will remain  in effect (meaning this test can be used) for the duration of the COVID-19 declaration under Section 564(b)(1) of the Act, 21 U.S.C.section 360bbb-3(b)(1), unless the authorization is terminated  or revoked sooner.       Influenza A by PCR NEGATIVE NEGATIVE Final   Influenza B by PCR NEGATIVE NEGATIVE Final    Comment: (NOTE) The Xpert Xpress SARS-CoV-2/FLU/RSV plus assay is intended as an aid in the diagnosis of influenza from Nasopharyngeal swab specimens and should not be used as a sole basis for treatment. Nasal washings and aspirates are unacceptable for Xpert Xpress SARS-CoV-2/FLU/RSV testing.  Fact Sheet for Patients: EntrepreneurPulse.com.au  Fact Sheet for Healthcare Providers: IncredibleEmployment.be  This test is not yet approved or cleared by the [place] FDA and has been authorized for detection and/or diagnosis of SARS-CoV-2 by FDA under  an Emergency Use Authorization (EUA). This EUA will remain in effect (meaning this test can be used) for the duration of the COVID-19 declaration under Section 564(b)(1) of the Act, 21 U.S.C. section 360bbb-3(b)(1), unless the authorization is terminated or revoked.  Performed at [facility], [place] [address], [place], [place] [address]   Blood Culture (routine x 2)     Status: None (Preliminary result)   Collection Time: 04/05/21  4:46 PM   Specimen: BLOOD  Result Value Ref Range Status   Specimen Description   Final    BLOOD RIGHT ANTECUBITAL Performed at [facility] [address] [place], [place] [address]    Special Requests   Final    BOTTLES DRAWN AEROBIC AND ANAEROBIC Blood Culture results may not be optimal due to an inadequate volume of blood received in culture bottles Performed at [facility] [address] [place], [place] [address]    Culture   Final    NO GROWTH < 24 HOURS Performed at [facility] [address], [place], [place] [address]    Report Status PENDING  Incomplete  Blood Culture (routine x 2)     Status: None (Preliminary result)   Collection Time: 04/05/21  5:38 PM   Specimen: BLOOD LEFT HAND  Result Value Ref Range Status   Specimen Description   Final    BLOOD LEFT HAND Performed at [facility] [address] [place], [place] [address]    Special Requests   Final    BOTTLES DRAWN AEROBIC ONLY Blood Culture results may not be optimal due to an inadequate volume of blood received in culture bottles Performed at [facility] [address], [place], [place] [address]    Culture   Final    NO GROWTH < 24 HOURS Performed at [facility] [address] [place],  [address]    Report Status PENDING  Incomplete         Radiology Studies: CT ABDOMEN PELVIS WO CONTRAST  Result Date: 04/05/2021 CLINICAL DATA:  Abdominal  pain, evaluate for cellulitis, recent ostomy for Crohn's disease EXAM: CT ABDOMEN AND PELVIS WITHOUT CONTRAST TECHNIQUE: Multidetector CT imaging of the abdomen and pelvis was performed following the standard protocol without IV contrast. COMPARISON:  01/08/2021 FINDINGS: Lower chest: No acute abnormality. Hepatobiliary: No solid liver abnormality is seen. Faintly calcified gallstones and/or sludge in the gallbladder. Gallbladder wall thickening, or biliary dilatation. Pancreas: Unremarkable. No pancreatic ductal dilatation or surrounding inflammatory changes. Spleen: Normal in size without significant abnormality. Adrenals/Urinary Tract: Adrenal glands are unremarkable. Kidneys are normal, without renal calculi, solid lesion, or hydronephrosis. Bladder is unremarkable. Stomach/Bowel: Stomach is within normal limits. Appendix is surgically absent. Interval rectosigmoid resection with rectal stump, creation of transverse colon diverting ostomy in the  right upper quadrant and left lower quadrant end colostomy. No evidence of acute bowel wall thickening, distention, or inflammatory changes. Vascular/Lymphatic: Aortic atherosclerosis. No enlarged abdominal or pelvic lymph nodes. Reproductive: Status post hysterectomy. Other: No abdominal wall hernia or abnormality. No abdominopelvic ascites. Musculoskeletal: No acute or significant osseous findings. IMPRESSION: 1. Interval rectosigmoid resection with rectal stump, creation of transverse colon diverting ostomy in the right upper quadrant and left lower quadrant end colostomy. 2. No noncontrast evidence of postoperative fluid collection or other complication. 3. No evidence of bowel obstruction. 4. Faintly calcified gallstones and/or sludge in the gallbladder without evidence of acute cholecystitis. Aortic Atherosclerosis (ICD10-I70.0). Electronically Signed   By: Eddie Candle M.D.   On: 04/05/2021 19:29   DG Chest Port 1 View  Result Date: 04/05/2021 CLINICAL DATA:   Redness surrounding new stoma on abdomen, ostomy performed 03/25/2021, nausea and single episode of vomiting, question sepsis. History smoking, hypertension EXAM: PORTABLE CHEST 1 VIEW COMPARISON:  Portable exam 1632 hours compared to 03/12/2021 FINDINGS: Normal heart size, mediastinal contours, and pulmonary vascularity. Lungs hyperinflated but clear. No infiltrate, pleural effusion, or pneumothorax. No acute osseous findings. IMPRESSION: Hyperinflated lungs without acute infiltrate. Electronically Signed   By: Lavonia Dana M.D.   On: 04/05/2021 16:43        Scheduled Meds: . enoxaparin (LOVENOX) injection  40 mg Subcutaneous Q24H  . feeding supplement  1 Container Oral TID BM  . pantoprazole  40 mg Oral BID AC  . polycarbophil  625 mg Oral BID  . pramipexole  1 mg Oral Daily  . venlafaxine XR  300 mg Oral Q breakfast   Continuous Infusions: . cefTRIAXone (ROCEPHIN)  IV Stopped (04/06/21 1829)  . lactated ringers 75 mL/hr at 04/07/21 0733  . vancomycin            Aline August, MD [facility] 04/07/2021, 8:23 AM

## 2021-04-07 NOTE — Plan of Care (Signed)
  Problem: Clinical Measurements: Goal: Respiratory complications will improve Outcome: Progressing   Problem: Clinical Measurements: Goal: Cardiovascular complication will be avoided Outcome: Progressing   Problem: Clinical Measurements: Goal: Diagnostic test results will improve Outcome: Progressing   Problem: Coping: Goal: Level of anxiety will decrease Outcome: Progressing   Problem: Elimination: Goal: Will not experience complications related to bowel motility Outcome: Progressing

## 2021-04-08 LAB — CBC
HCT: 32.5 % — ABNORMAL LOW (ref 36.0–46.0)
Hemoglobin: 10.3 g/dL — ABNORMAL LOW (ref 12.0–15.0)
MCH: 30.1 pg (ref 26.0–34.0)
MCHC: 31.7 g/dL (ref 30.0–36.0)
MCV: 95 fL (ref 80.0–100.0)
Platelets: 324 10*3/uL (ref 150–400)
RBC: 3.42 MIL/uL — ABNORMAL LOW (ref 3.87–5.11)
RDW: 13.2 % (ref 11.5–15.5)
WBC: 7.5 10*3/uL (ref 4.0–10.5)
nRBC: 0 % (ref 0.0–0.2)

## 2021-04-08 LAB — C-REACTIVE PROTEIN: CRP: <0.5 mg/dL (ref <1.0)

## 2021-04-08 LAB — BASIC METABOLIC PANEL
Anion gap: 5 (ref 5–15)
BUN: 18 mg/dL (ref 8–23)
CO2: 24 mmol/L (ref 22–32)
Calcium: 8.6 mg/dL — ABNORMAL LOW (ref 8.9–10.3)
Chloride: 104 mmol/L (ref 98–111)
Creatinine, Ser: 0.93 mg/dL (ref 0.44–1.00)
GFR, Estimated: >60 mL/min (ref >60)
Glucose, Bld: 103 mg/dL — ABNORMAL HIGH (ref 70–99)
Potassium: 4.5 mmol/L (ref 3.5–5.1)
Sodium: 133 mmol/L — ABNORMAL LOW (ref 135–145)

## 2021-04-08 LAB — MAGNESIUM: Magnesium: 1.8 mg/dL (ref 1.7–2.4)

## 2021-04-08 NOTE — Progress Notes (Signed)
Received call from hematology that blood collected this morning was clotted. Called lab for a recollect.

## 2021-04-08 NOTE — Consult Note (Signed)
[facility] Nurse ostomy follow up Patient receiving care in [facility]. Stoma type/location: RUQ ileostomy Stomal assessment/size: 1 3/8 inches, round, pink, moist Peristomal assessment: severely impacted by effluent in contact with skin due to frequent leaking Treatment options for stomal/peristomal skin: crusting, barrier ring, one piece flexible convex pouch, and belt Output: high output brown effluent  Ostomy pouching: 1pc. Pouch [name] #[id number], barrier ring [name] [phone], stoma powder [name] #[phone], skin barrier wipes [phone], and belt [name]#[id number] Education provided:  Patient was shown how to crust with powder and skin prep for 3 layers, how to place the barrier ring around the stoma, to wash around the stoma with water only, the use of a convex pouch and belt.  The belt must be worn AT ALL TIMES, unless the patient is showering. Additional pouches, rings, powder, skin barrier wipes and belt in room. At the conclusion of placing the pouching system I placed a warming pack over the pouch barrier and instructed to to stay flat in the bed for 15 minutes. Val Riles, RN, MSN, CWOCN, CNS-BC, pager [phone]

## 2021-04-08 NOTE — Progress Notes (Signed)
Patient ID: Cynthia [name], female   DOB: April 20, 1961, 60 y.o.   MRN: [id number]  PROGRESS NOTE    Cynthia [name]  EVO:[id number] DOB: May 19, 1961 DOA: 04/05/2021 PCP: Denita Lung, MD   Brief Narrative:   60 y.o. female with medical history significant for Crohn's disease status post sigmoidectomy in April 2022, depression, anxiety, and hypertension presented with redness around ostomy with chills and nausea and vomiting.  On presentation, she was afebrile but tachycardic.  Creatinine was 1.28 with WBCs of 12,100 and lactic acid of 2.7. CT of the abdomen and pelvis demonstrated interval rectosigmoid resection without evidence for fluid collection or other complication.  She was started on IV fluids and antibiotics.  Assessment & Plan:   Abdominal wall cellulitis around ostomy site Sepsis: Present on admission -Presented with fevers, tachycardia, leukocytosis, elevated lactic acid possibly from abdominal wall cellulitis.  CT of the abdomen showed no fluid collection or other complication -Continue Rocephin and vancomycin.  Off IV fluids.  Blood cultures are negative so far. -Wound care consult is still pending. -Currently afebrile and hemodynamically stable.  Leukocytosis -Resolved  Acute kidney injury -Resolved with IV fluids.  Hyponatremia -Mild.  Monitor.  Hyperkalemia -Resolved  Hypomagnesemia -Improved.  History of recent diagnosis of Crohn's disease status post sigmoidectomy in April 2022 - She was admitted to outside hospital in April with fistula and obstruction and underwent sigmoidectomy with ostomy - No apparent complication on CT of the abdomen.  Depression, anxiety -Continue Effexor  Hypertension -Blood pressure on the lower side.  Antihypertensives on hold.  DVT prophylaxis: Lovenox Code Status: Full Family Communication: None at bedside Disposition Plan: Status is: Inpatient  Remains inpatient appropriate because:Inpatient level of care  appropriate due to severity of illness   Dispo: The patient is from: Home              Anticipated d/c is to: Home              Patient currently is not medically stable to d/c.   Difficult to place patient No   Consultants: None  Procedures: None  Antimicrobials: Rocephin from 04/04/2021 onwards.  Vancomycin from 04/06/2021 onwards   Subjective: Patient seen and examined at bedside.  Feels that her redness and pain around the ostomy site is improving.  Denies worsening fever, abdominal pain or vomiting. Objective: Vitals:   04/07/21 0448 04/07/21 1503 04/07/21 2213 04/08/21 0528  BP: 121/86 105/77 110/80 113/73  Pulse: 91 75 93 86  Resp: 15 14 16 16   Temp: 97.9 F (36.6 C) 97.6 F (36.4 C) 98 F (36.7 C) 98 F (36.7 C)  TempSrc: Oral Oral Oral Oral  SpO2: 100% 100% 100% 100%  Weight:      Height:        Intake/Output Summary (Last 24 hours) at 04/08/2021 0812 Last data filed at 04/08/2021 2993 Gross per 24 hour  Intake 1010.54 ml  Output 700 ml  Net 310.54 ml   Filed Weights   04/05/21 1550 04/05/21 2356  Weight: 49.9 kg 55.3 kg    Examination:  General exam: No acute distress.  Currently on room air. Respiratory system: Bilateral decreased breath sounds at bases, no wheezing cardiovascular system: S1-S2 heard, rate controlled Gastrointestinal system: Abdomen is slightly distended, soft and mildly tender around ostomy site with.  Normal bowel sounds heard.  Still has some erythema and warmth around the ostomy site extremities: No cyanosis or clubbing  Data Reviewed: I have personally reviewed following labs and imaging  studies  CBC: Recent Labs  Lab 04/05/21 1738 04/06/21 0711 04/07/21 0521 04/08/21 0700  WBC 12.1* 7.1 6.2 7.5  NEUTROABS 7.8*  --   --   --   HGB 13.0 10.2* 9.8* 10.3*  HCT 41.0 30.1* 31.2* 32.5*  MCV 94.9 89.9 96.0 95.0  PLT 379 PLATELET CLUMPS NOTED ON SMEAR, UNABLE TO ESTIMATE 330 623   Basic Metabolic Panel: Recent Labs  Lab  04/05/21 1918 04/06/21 0711 04/07/21 0521 04/08/21 0606  NA 133* 136 135 133*  K 5.4* 4.7 4.1 4.5  CL 105 108 104 104  CO2 19* 24 25 24   GLUCOSE 81 74 75 103*  BUN 38* 30* 22 18  CREATININE 1.28* 0.93 0.85 0.93  CALCIUM 8.5* 8.4* 8.7* 8.6*  MG  --   --  1.1* 1.8   GFR: Estimated Creatinine Clearance: 52.5 mL/min (by C-G formula based on SCr of 0.93 mg/dL). Liver Function Tests: Recent Labs  Lab 04/05/21 1918  AST 15  ALT 11  ALKPHOS 45  BILITOT 0.5  PROT 6.2*  ALBUMIN 3.1*   No results for input(s): LIPASE, AMYLASE in the last 168 hours. No results for input(s): AMMONIA in the last 168 hours. Coagulation Profile: Recent Labs  Lab 04/05/21 1738  INR 1.0   Cardiac Enzymes: No results for input(s): CKTOTAL, CKMB, CKMBINDEX, TROPONINI in the last 168 hours. BNP (last 3 results) No results for input(s): PROBNP in the last 8760 hours. HbA1C: No results for input(s): HGBA1C in the last 72 hours. CBG: No results for input(s): GLUCAP in the last 168 hours. Lipid Profile: No results for input(s): CHOL, HDL, LDLCALC, TRIG, CHOLHDL, LDLDIRECT in the last 72 hours. Thyroid Function Tests: No results for input(s): TSH, T4TOTAL, FREET4, T3FREE, THYROIDAB in the last 72 hours. Anemia Panel: No results for input(s): VITAMINB12, FOLATE, FERRITIN, TIBC, IRON, RETICCTPCT in the last 72 hours. Sepsis Labs: Recent Labs  Lab 04/05/21 1612 04/05/21 1812  LATICACIDVEN 2.7* 1.7    Recent Results (from the past 240 hour(s))  Resp Panel by RT-PCR (Flu A&B, Covid) Nasopharyngeal Swab     Status: None   Collection Time: 04/05/21  4:12 PM   Specimen: Nasopharyngeal Swab; Nasopharyngeal(NP) swabs in vial transport medium  Result Value Ref Range Status   SARS Coronavirus 2 by RT PCR NEGATIVE NEGATIVE Final    Comment: (NOTE) SARS-CoV-2 target nucleic acids are NOT DETECTED.  The SARS-CoV-2 RNA is generally detectable in upper respiratory specimens during the acute phase of  infection. The lowest concentration of SARS-CoV-2 viral copies this assay can detect is 138 copies/mL. A negative result does not preclude SARS-Cov-2 infection and should not be used as the sole basis for treatment or other patient management decisions. A negative result may occur with  improper specimen collection/handling, submission of specimen other than nasopharyngeal swab, presence of viral mutation(s) within the areas targeted by this assay, and inadequate number of viral copies(<138 copies/mL). A negative result must be combined with clinical observations, patient history, and epidemiological information. The expected result is Negative.  Fact Sheet for Patients:  EntrepreneurPulse.com.au  Fact Sheet for Healthcare Providers:  IncredibleEmployment.be  This test is no t yet approved or cleared by the [place] FDA and  has been authorized for detection and/or diagnosis of SARS-CoV-2 by FDA under an Emergency Use Authorization (EUA). This EUA will remain  in effect (meaning this test can be used) for the duration of the COVID-19 declaration under Section 564(b)(1) of the Act, 21 U.S.C.section 360bbb-3(b)(1), unless the  authorization is terminated  or revoked sooner.       Influenza A by PCR NEGATIVE NEGATIVE Final   Influenza B by PCR NEGATIVE NEGATIVE Final    Comment: (NOTE) The Xpert Xpress SARS-CoV-2/FLU/RSV plus assay is intended as an aid in the diagnosis of influenza from Nasopharyngeal swab specimens and should not be used as a sole basis for treatment. Nasal washings and aspirates are unacceptable for Xpert Xpress SARS-CoV-2/FLU/RSV testing.  Fact Sheet for Patients: EntrepreneurPulse.com.au  Fact Sheet for Healthcare Providers: IncredibleEmployment.be  This test is not yet approved or cleared by the [place] FDA and has been authorized for detection and/or diagnosis of SARS-CoV-2  by FDA under an Emergency Use Authorization (EUA). This EUA will remain in effect (meaning this test can be used) for the duration of the COVID-19 declaration under Section 564(b)(1) of the Act, 21 U.S.C. section 360bbb-3(b)(1), unless the authorization is terminated or revoked.  Performed at [facility], [place] [address], [place], [place] [address]   Blood Culture (routine x 2)     Status: None (Preliminary result)   Collection Time: 04/05/21  4:46 PM   Specimen: BLOOD  Result Value Ref Range Status   Specimen Description   Final    BLOOD RIGHT ANTECUBITAL Performed at [facility] [address], [place], [place] [address]    Special Requests   Final    BOTTLES DRAWN AEROBIC AND ANAEROBIC Blood Culture results may not be optimal due to an inadequate volume of blood received in culture bottles Performed at [facility] [address] [place], [place] [address]    Culture   Final    NO GROWTH 2 DAYS Performed at [facility] [address] [place], [place] [address]    Report Status PENDING  Incomplete  Blood Culture (routine x 2)     Status: None (Preliminary result)   Collection Time: 04/05/21  5:38 PM   Specimen: BLOOD LEFT HAND  Result Value Ref Range Status   Specimen Description   Final    BLOOD LEFT HAND Performed at [facility] [address], [place], [place] [address]    Special Requests   Final    BOTTLES DRAWN AEROBIC ONLY Blood Culture results may not be optimal due to an inadequate volume of blood received in culture bottles Performed at [facility] [address] [place], [place] [address]    Culture   Final    NO GROWTH 2 DAYS Performed at [facility] [address], [place], [place] [address]    Report Status PENDING  Incomplete  Urine culture     Status: Abnormal   Collection Time: 04/05/21 10:00 PM   Specimen: In/Out Cath Urine  Result  Value Ref Range Status   Specimen Description   Final    IN/OUT CATH URINE Performed at [facility] [address], [place], [place] [address]    Special Requests   Final    NONE Performed at [facility], [place] [address], [place], [place] [address]    Culture (A)  Final    2,000 COLONIES/mL LACTOBACILLUS SPECIES Standardized susceptibility testing for this organism is not available. Performed at [facility], [place] [address] [place], [place] [address]    Report Status 04/07/2021 FINAL  Final         Radiology Studies: No results found.      Scheduled Meds: .  enoxaparin (LOVENOX) injection  40 mg Subcutaneous Q24H  . feeding supplement  1 Container Oral TID BM  . pantoprazole  40 mg Oral BID AC  . polycarbophil  625 mg Oral BID  . pramipexole  1 mg Oral Daily  . venlafaxine XR  300 mg Oral Q breakfast   Continuous Infusions: . cefTRIAXone (ROCEPHIN)  IV Stopped (04/07/21 1931)  . vancomycin Stopped (04/07/21 1721)          Aline August, MD [facility] 04/08/2021, 8:12 AM

## 2021-04-09 LAB — CBC
HCT: 33.1 % — ABNORMAL LOW (ref 36.0–46.0)
Hemoglobin: 10.3 g/dL — ABNORMAL LOW (ref 12.0–15.0)
MCH: 30.5 pg (ref 26.0–34.0)
MCHC: 31.1 g/dL (ref 30.0–36.0)
MCV: 97.9 fL (ref 80.0–100.0)
Platelets: 301 10*3/uL (ref 150–400)
RBC: 3.38 MIL/uL — ABNORMAL LOW (ref 3.87–5.11)
RDW: 13.3 % (ref 11.5–15.5)
WBC: 6.3 10*3/uL (ref 4.0–10.5)
nRBC: 0 % (ref 0.0–0.2)

## 2021-04-09 LAB — BASIC METABOLIC PANEL
Anion gap: 6 (ref 5–15)
BUN: 15 mg/dL (ref 8–23)
CO2: 23 mmol/L (ref 22–32)
Calcium: 8.8 mg/dL — ABNORMAL LOW (ref 8.9–10.3)
Chloride: 108 mmol/L (ref 98–111)
Creatinine, Ser: 0.82 mg/dL (ref 0.44–1.00)
GFR, Estimated: >60 mL/min (ref >60)
Glucose, Bld: 91 mg/dL (ref 70–99)
Potassium: 4.3 mmol/L (ref 3.5–5.1)
Sodium: 137 mmol/L (ref 135–145)

## 2021-04-09 LAB — MAGNESIUM: Magnesium: 1.7 mg/dL (ref 1.7–2.4)

## 2021-04-09 LAB — C-REACTIVE PROTEIN: CRP: 0.6 mg/dL (ref <1.0)

## 2021-04-09 MED ORDER — ACETAMINOPHEN 500 MG PO TABS: 1000.0000 mg | ORAL_TABLET | Freq: Four times a day (QID) | ORAL | 0 refills | Status: DC | PRN

## 2021-04-09 MED ORDER — CHOLESTYRAMINE 4 G PO PACK: 1.0000 | PACK | Freq: Two times a day (BID) | ORAL | 1 refills | Status: DC

## 2021-04-09 NOTE — Progress Notes (Signed)
Pharmacy Antibiotic Note  Cynthia [name] is a 60 y.o. female with a h/o Crohn's disease s/p sigmoidectomy in April admitted on 04/05/2021 with abdominal wall cellulitis around the ostomy site.  Pharmacy has been consulted for vancomycin dosing.  04/09/21 1:18 PM   SCr 0.82, stable  WBC wnl  Afebrile  Plan: Ceftriaxone 2 g IV daily per MD Continue vancomycin 1000 mg IV Q 24 hrs. Goal AUC 400-550. Expected AUC: 523 SCr used: 0.93  Will f/u renal function, culture results, and check levels if indicated Anticipate de-escalation depending on culture results   Height: 5' 3"  (160 cm) Weight: 55.3 kg (121 lb 14.6 oz) IBW/kg (Calculated) : 52.4  Temp (24hrs), Avg:98 F (36.7 C), Min:97.5 F (36.4 C), Max:98.4 F (36.9 C)  Recent Labs  Lab 04/05/21 1612 04/05/21 1738 04/05/21 1812 04/05/21 1918 04/06/21 0711 04/07/21 0521 04/08/21 0606 04/08/21 0700 04/09/21 1009  WBC  --  12.1*  --   --  7.1 6.2  --  7.5 6.3  CREATININE  --   --   --  1.28* 0.93 0.85 0.93  --  0.82  LATICACIDVEN 2.7*  --  1.7  --   --   --   --   --   --     Estimated Creatinine Clearance: 59.6 mL/min (by C-G formula based on SCr of 0.82 mg/dL).    Allergies  Allergen Reactions  . Iodine     REACTION: in eye gtts Iodine eye drops; pt has no reactions to CT contrast    Antimicrobials this admission: 6/3 CTX >>  6/4 vancomycin >>   Dose adjustments this admission:  Microbiology results: 6/3 BCx: ngtd 6/3 UCx:  2k colonies Lactobacillus 6/3 COVID/flu: neg/neg  Thank you for allowing pharmacy to be a part of this patient's care.  Ulice Dash D 04/09/2021 1:17 PM

## 2021-04-09 NOTE — TOC Initial Note (Signed)
Transition of Care [facility]) - Initial/Assessment Note    Patient Details  Name: Cynthia [name] MRN: [id number] Date of Birth: [birth year]  [facility] CM/SW Contact:    Kaileb Monsanto, Marjie Skiff, RN Phone Number: 04/09/2021, 9:41 AM  Clinical Narrative:                 [facility] consult for transportation issues and trouble picking up meds. Spoke with pt to assist with issues. Encouraged pt to contact her insurance company to see if they have a pharmacy to deliver medications to home. Pt states she plans to work on this. She states that she does not have a vehicle to drive right now but she plans to look into [place]. She states that she has a friend that [place] the beach [place] when she discharges. He will be able to drive her around and she will get her prescriptions at the [facility] [place]. No other needs identified.  Expected Discharge Plan: Home/Self Care Barriers to Discharge: Continued Medical Work up   Patient Goals and CMS Choice Patient states their goals for this hospitalization and ongoing recovery are:: To get out of the hospital and go to the beach.      Expected Discharge Plan and Services Expected Discharge Plan: Home/Self Care   Discharge Planning Services: CM Consult   Living arrangements for the past 2 months: Single Family Home                 Prior Living Arrangements/Services Living arrangements for the past 2 months: Single Family Home Lives with:: Self Patient language and need for interpreter reviewed:: Yes Do you feel safe going back to the place where you live?: Yes      Need for Family Participation in Patient Care: Yes (Comment) Care giver support system in place?: Yes (comment)   Criminal Activity/Legal Involvement Pertinent to Current Situation/Hospitalization: No - Comment as needed  Activities of Daily Living Home Assistive Devices/Equipment: Biomedical engineer (Comment) (colostomy supplies) ADL Screening (condition at time of  admission) Patient's cognitive ability adequate to safely complete daily activities?: Yes Is the patient deaf or have difficulty hearing?: No Does the patient have difficulty seeing, even when wearing glasses/contacts?: No Does the patient have difficulty concentrating, remembering, or making decisions?: No Patient able to express need for assistance with ADLs?: Yes Does the patient have difficulty dressing or bathing?: No Independently performs ADLs?: Yes (appropriate for developmental age) Does the patient have difficulty walking or climbing stairs?: Yes Weakness of Legs: Both Weakness of Arms/Hands: Both  Permission Sought/Granted                  Emotional Assessment   Attitude/Demeanor/Rapport: Gracious Affect (typically observed): Calm Orientation: : Oriented to Self,Oriented to Place,Oriented to  Time,Oriented to Situation Alcohol / Substance Use: Not Applicable    Admission diagnosis:  AKI (acute kidney injury) ([place]) [N17.9] Sepsis due to cellulitis ([place]) [L03.90, A41.9] Patient Active Problem List   Diagnosis Date Noted  . Sepsis due to cellulitis ([place]) 04/05/2021  . AKI (acute kidney injury) ([place]) 04/05/2021  . Hyperkalemia 04/05/2021  . Hyponatremia 04/05/2021  . Adjustment disorder 03/14/2021  . Unspecified severe protein-calorie malnutrition ([place]) 03/13/2021  . COVID-19 01/25/2021  . Leukocytosis 01/24/2021  . Crohn's colitis, with intestinal obstruction ([place]) 01/24/2021  . 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 History of rectal polyp 2020 01/12/2021  . Anxiety associated with depression 01/12/2021  .  Hypokalemia 12/04/2020  . Cough 11/27/2020  . Otalgia of both ears 11/27/2020  . Body aches 11/27/2020  . Fever 11/27/2020  . High risk medication use 11/27/2020  . 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[place]) 06/27/2020  . 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 Crohn's ileitis, with intestinal obstruction ([place]) 10/05/2019  . Colitis   . Rectal polyp   . 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  . RLS (restless legs syndrome) 09/25/2014  . Current smoker 09/19/2011  . Migraine headache 09/19/2011  . Essential hypertension 09/19/2011  . Obesity (BMI 30-39.9) 09/19/2011  . History of serrated colonic polyp 01/13/2011  . Gastroesophageal reflux disease 03/17/2008  . MVP (mitral valve prolapse) 03/17/2008  . HIATAL HERNIA 05/03/2002   PCP:  Denita Lung, MD Pharmacy:  No Pharmacies Listed    Social Determinants of Health (SDOH) Interventions    Readmission Risk Interventions Readmission Risk Prevention Plan 04/09/2021  Transportation Screening Complete  Medication Review ([place]) Complete  PCP or Specialist appointment within 3-5 days of discharge Complete  HRI or [place] Complete  SW Recovery Care/Counseling Consult Complete  [place] Not Applicable  Some recent data might be hidden

## 2021-04-09 NOTE — Consult Note (Signed)
[place] Nurse ostomy follow up Patient receiving care in [place] 1601. Yesterday I implemented triple layer crusting to abdominal tissue and peristomal tissue with stoma powder and skin barrier wipes, a flexible convex pouch with barrier ring, and a snug ostomy belt.  I am happy to report the pouching system I placed yesterday is still intact without leakage, the skin around the pouch is greatly improved, the pain for the patient is significantly reduced, and she has a smile on her face as she is feeling much better.  This approach will be continued while she is in hospital with [place]. Val Riles, RN, MSN, CWOCN, CNS-BC, pager [phone]

## 2021-04-09 NOTE — Consult Note (Signed)
   [facility] CM Inpatient Consult   04/09/2021  Marlina Cataldi [id number] December 04, 1960 [id number]   Patient screened due to noted high risk score for unplanned readmission for post hospital transition of care needs. Patient eligible for services under [place] plan.  Spoke with patient via telephone while inpatient. HIPAA verified. Explained [facility] CM services offering outpatient assistance with chronic disease management and assistance with community needs. Patient verbalizes need for assistance with transportation and receiving medications at home. Referral to [place] care guide and community RNCM will be placed for post hospital follow up.  Of note, [facility] Care Management services does not replace or interfere with any services that are arranged by inpatient case management or social work.  Netta Cedars, MSN, [facility] Liaison Nurse Mobile Phone [phone]  Toll free office [phone]

## 2021-04-10 LAB — CULTURE, BLOOD (ROUTINE X 2)
Culture: NO GROWTH
Culture: NO GROWTH

## 2021-04-10 NOTE — Discharge Summary (Signed)
Physician Discharge Summary   RINIYAH SPEICH JXB:[id number] DOB: [birth year] DOA: 04/05/2021  PCP: Denita Lung, MD  Admit date: 04/05/2021 Discharge date: 04/09/2021   Admitted From: home Disposition:  home Discharging physician: Dwyane Dee, MD  Recommendations for Outpatient Follow-up:  1. Evaluate wound ostomy site   Patient discharged to home in Discharge Condition: stable Risk of unplanned readmission score: Unplanned Admission- Pilot do not use: 30.28  CODE STATUS: Full Diet recommendation:   Hospital Course: 60 y.o.femalewith medical history significant forCrohn's disease status post sigmoidectomy in April 2022, depression, anxiety, and hypertension presented with redness around ostomy with chills and nausea and vomiting.  On presentation, she was afebrile but tachycardic.  Creatinine was 1.28 with WBCs of 12,100 and lactic acid of 2.7. CT of the abdomen and pelvis demonstrated interval rectosigmoid resection without evidence for fluid collection or other complication.  She was started on IV fluids and antibiotics.  Assessment & Plan:   Abdominal wall cellulitis around ostomy site Sepsis: Present on admission -Presented with fevers, tachycardia, leukocytosis, elevated lactic acid possibly from abdominal wall cellulitis.  CT of the abdomen showed no fluid collection or other complication -Continued Rocephin and vancomycin during hospitalization; antibiotic course completed prior to discharge -Evaluated by wound care with supplies given prior to discharge and ostomy education teaching given -Currently afebrile and hemodynamically stable.  Acute kidney injury -Resolved with IV fluids.  Hyponatremia -Mild.  Improved with fluids  Hyperkalemia -Resolved  Hypomagnesemia -Improved.  History of recent diagnosis of Crohn's disease status post sigmoidectomy in April 2022 - She was admitted to outside hospital in April with fistula and obstruction and underwent  sigmoidectomy with ostomy - No apparent complication on CT of the abdomen.  Depression, anxiety -Continue Effexor  Hypertension -Blood pressure on the lower side.  Antihypertensives on hold.    The patient's chronic medical conditions were treated accordingly per the patient's home medication regimen except as noted.  On day of discharge, patient was felt deemed stable for discharge. Patient/family member advised to call PCP or come back to ER if needed.   Principal Diagnosis: Sepsis due to cellulitis [facility])  Discharge Diagnoses: Active Hospital Problems   Diagnosis Date Noted  . Sepsis due to cellulitis ([place]) 04/05/2021  . AKI (acute kidney injury) ([place]) 04/05/2021  . Hyperkalemia 04/05/2021  . Hyponatremia 04/05/2021  . 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[place]) 06/27/2020  . Essential hypertension 09/19/2011    Resolved Hospital Problems  No resolved problems to display.    Discharge Instructions    AMB Referral to [facility] Coordinaton   Complete by: As directed    Please refer to RN case manager for post hospital follow up.  Please refer for assistance with transportation.  Netta Cedars, MSN, RN [facility] Liaison Nurse Mobile Phone [phone]  Toll free office [phone]   Reason for Referral:  SDOH (Social Determinants of Health) [facility] Disease Management ([employer] payers)     SDOH identified: Transportation   Disease managment services needed: Nurse Case Manager   Diagnoses of: Other   Other Diagnosis: Crohn's disease with sigmoidectomy   Expected date of contact: Urgent - 7 Days   Increase activity slowly   Complete by: As directed      Allergies as of 04/09/2021      Reactions   Iodine    REACTION: in eye gtts Iodine eye drops; pt has no reactions to CT contrast      Medication List    STOP taking these  medications   ondansetron 4 MG disintegrating tablet Commonly known as: ZOFRAN-ODT   phenylephrine 1 % nasal spray Commonly  known as: NEO-SYNEPHRINE   predniSONE 10 MG tablet Commonly known as: DELTASONE     TAKE these medications   acetaminophen 500 MG tablet Commonly known as: TYLENOL Take 2 tablets (1,000 mg total) by mouth every 6 (six) hours as needed for moderate pain. What changed: how much to take   cholestyramine 4 g packet Commonly known as: Questran Take 1 packet by mouth 2 (two) times daily for 14 days.   diphenhydramine-acetaminophen 25-500 MG Tabs tablet Commonly known as: TYLENOL PM Take 2 tablets by mouth at bedtime as needed (sleep/pain).   esomeprazole 40 MG capsule Commonly known as: NEXIUM TAKE ONE (1) CAPSULE BY MOUTH 2 TIMES DAILY What changed:   how much to take  how to take this  when to take this  additional instructions   loperamide 2 MG capsule Commonly known as: IMODIUM Take 2 mg by mouth daily as needed for diarrhea or loose stools.   losartan-hydrochlorothiazide 100-12.5 MG tablet Commonly known as: HYZAAR Take 1 tablet by mouth daily.   polycarbophil 625 MG tablet Commonly known as: FIBERCON Take 1 tablet (625 mg total) by mouth 2 (two) times daily.   pramipexole 1 MG tablet Commonly known as: MIRAPEX Take 1 tablet (1 mg total) by mouth daily.   venlafaxine XR 150 MG 24 hr capsule Commonly known as: EFFEXOR-XR Take 2 capsules (300 mg total) by mouth daily with breakfast.       Allergies  Allergen Reactions  . Iodine     REACTION: in eye gtts Iodine eye drops; pt has no reactions to CT contrast    Consultations:   Discharge Exam: BP 119/80 (BP Location: Right Arm)   Pulse 94   Temp (!) 97.5 F (36.4 C) (Oral)   Resp 16   Ht 5' 3"  (1.6 m)   Wt 55.3 kg   LMP 06/10/2011 (Exact Date)   SpO2 100%   BMI 21.60 kg/m  General appearance: alert, cooperative and no distress Head: Normocephalic, without obvious abnormality, atraumatic Eyes: EOMI Lungs: clear to auscultation bilaterally Heart: regular rate and rhythm and S1, S2  normal Abdomen: Ostomy bag in place with liquid stool inside and good seal.  Mild erythema around ostomy area with some mild skin irritation sites.  Bowel sounds present and no tenderness to palpation Extremities: No edema Skin: mobility and turgor normal Neurologic: Grossly normal  The results of significant diagnostics from this hospitalization (including imaging, microbiology, ancillary and laboratory) are listed below for reference.   Microbiology: Recent Results (from the past 240 hour(s))  Resp Panel by RT-PCR (Flu A&B, Covid) Nasopharyngeal Swab     Status: None   Collection Time: 04/05/21  4:12 PM   Specimen: Nasopharyngeal Swab; Nasopharyngeal(NP) swabs in vial transport medium  Result Value Ref Range Status   SARS Coronavirus 2 by RT PCR NEGATIVE NEGATIVE Final    Comment: (NOTE) SARS-CoV-2 target nucleic acids are NOT DETECTED.  The SARS-CoV-2 RNA is generally detectable in upper respiratory specimens during the acute phase of infection. The lowest concentration of SARS-CoV-2 viral copies this assay can detect is 138 copies/mL. A negative result does not preclude SARS-Cov-2 infection and should not be used as the sole basis for treatment or other patient management decisions. A negative result may occur with  improper specimen collection/handling, submission of specimen other than nasopharyngeal swab, presence of viral mutation(s) within the areas targeted  by this assay, and inadequate number of viral copies(<138 copies/mL). A negative result must be combined with clinical observations, patient history, and epidemiological information. The expected result is Negative.  Fact Sheet for Patients:  EntrepreneurPulse.com.au  Fact Sheet for Healthcare Providers:  IncredibleEmployment.be  This test is no t yet approved or cleared by the [place] FDA and  has been authorized for detection and/or diagnosis of SARS-CoV-2 by FDA under an  Emergency Use Authorization (EUA). This EUA will remain  in effect (meaning this test can be used) for the duration of the COVID-19 declaration under Section 564(b)(1) of the Act, 21 U.S.C.section 360bbb-3(b)(1), unless the authorization is terminated  or revoked sooner.       Influenza A by PCR NEGATIVE NEGATIVE Final   Influenza B by PCR NEGATIVE NEGATIVE Final    Comment: (NOTE) The Xpert Xpress SARS-CoV-2/FLU/RSV plus assay is intended as an aid in the diagnosis of influenza from Nasopharyngeal swab specimens and should not be used as a sole basis for treatment. Nasal washings and aspirates are unacceptable for Xpert Xpress SARS-CoV-2/FLU/RSV testing.  Fact Sheet for Patients: EntrepreneurPulse.com.au  Fact Sheet for Healthcare Providers: IncredibleEmployment.be  This test is not yet approved or cleared by the [place] FDA and has been authorized for detection and/or diagnosis of SARS-CoV-2 by FDA under an Emergency Use Authorization (EUA). This EUA will remain in effect (meaning this test can be used) for the duration of the COVID-19 declaration under Section 564(b)(1) of the Act, 21 U.S.C. section 360bbb-3(b)(1), unless the authorization is terminated or revoked.  Performed at [facility], [place] [address], [place], [place] [address]   Blood Culture (routine x 2)     Status: None   Collection Time: 04/05/21  4:46 PM   Specimen: BLOOD  Result Value Ref Range Status   Specimen Description   Final    BLOOD RIGHT ANTECUBITAL Performed at [facility] [address] [place], [place] [address]    Special Requests   Final    BOTTLES DRAWN AEROBIC AND ANAEROBIC Blood Culture results may not be optimal due to an inadequate volume of blood received in culture bottles Performed at [place] [address], [place], [place] [address]    Culture   Final    NO GROWTH 5  DAYS Performed at [facility], [place] [address], [place], [place] [address]    Report Status 04/10/2021 FINAL  Final  Blood Culture (routine x 2)     Status: None   Collection Time: 04/05/21  5:38 PM   Specimen: BLOOD LEFT HAND  Result Value Ref Range Status   Specimen Description   Final    BLOOD LEFT HAND Performed at [place] [address] [place], [place] [address]    Special Requests   Final    BOTTLES DRAWN AEROBIC ONLY Blood Culture results may not be optimal due to an inadequate volume of blood received in culture bottles Performed at [place] [address] [place], [place] [address]    Culture   Final    NO GROWTH 5 DAYS Performed at [facility], [place] [address] [place], [place] [address]    Report Status 04/10/2021 FINAL  Final  Urine culture     Status: Abnormal   Collection Time: 04/05/21 10:00 PM   Specimen: In/Out Cath Urine  Result Value Ref Range Status   Specimen Description   Final  IN/OUT CATH URINE Performed at [facility], [place] [address] [place], [place] [address]    Special Requests   Final    NONE Performed at [facility], [place] [address], [place], [place] [address]    Culture (A)  Final    2,000 COLONIES/mL LACTOBACILLUS SPECIES Standardized susceptibility testing for this organism is not available. Performed at [facility], [place] [address], [place], [place] [address]    Report Status 04/07/2021 FINAL  Final     Labs: BNP (last 3 results) No results for input(s): BNP in the last 8760 hours. Basic Metabolic Panel: Recent Labs  Lab 04/05/21 1918 04/06/21 0711 04/07/21 0521 04/08/21 0606 04/09/21 1009  NA 133* 136 135 133* 137  K 5.4* 4.7 4.1 4.5 4.3  CL 105 108 104 104 108  CO2 19* 24 25 24 23   GLUCOSE 81 74 75 103* 91  BUN 38* 30* 22 18 15   CREATININE 1.28* 0.93 0.85 0.93 0.82  CALCIUM 8.5* 8.4* 8.7* 8.6* 8.8*  MG  --   --   1.1* 1.8 1.7   Liver Function Tests: Recent Labs  Lab 04/05/21 1918  AST 15  ALT 11  ALKPHOS 45  BILITOT 0.5  PROT 6.2*  ALBUMIN 3.1*   No results for input(s): LIPASE, AMYLASE in the last 168 hours. No results for input(s): AMMONIA in the last 168 hours. CBC: Recent Labs  Lab 04/05/21 1738 04/06/21 0711 04/07/21 0521 04/08/21 0700 04/09/21 1009  WBC 12.1* 7.1 6.2 7.5 6.3  NEUTROABS 7.8*  --   --   --   --   HGB 13.0 10.2* 9.8* 10.3* 10.3*  HCT 41.0 30.1* 31.2* 32.5* 33.1*  MCV 94.9 89.9 96.0 95.0 97.9  PLT 379 PLATELET CLUMPS NOTED ON SMEAR, UNABLE TO ESTIMATE 330 324 301   Cardiac Enzymes: No results for input(s): CKTOTAL, CKMB, CKMBINDEX, TROPONINI in the last 168 hours. BNP: Invalid input(s): POCBNP CBG: No results for input(s): GLUCAP in the last 168 hours. D-Dimer No results for input(s): DDIMER in the last 72 hours. Hgb A1c No results for input(s): HGBA1C in the last 72 hours. Lipid Profile No results for input(s): CHOL, HDL, LDLCALC, TRIG, CHOLHDL, LDLDIRECT in the last 72 hours. Thyroid function studies No results for input(s): TSH, T4TOTAL, T3FREE, THYROIDAB in the last 72 hours.  Invalid input(s): FREET3 Anemia work up No results for input(s): VITAMINB12, FOLATE, FERRITIN, TIBC, IRON, RETICCTPCT in the last 72 hours. Urinalysis    Component Value Date/Time   COLORURINE YELLOW 04/05/2021 2200   APPEARANCEUR CLEAR 04/05/2021 2200   LABSPEC 1.018 04/05/2021 2200   LABSPEC 1,025 12/18/2020 1644   PHURINE 5.0 04/05/2021 2200   GLUCOSEU NEGATIVE 04/05/2021 2200   HGBUR NEGATIVE 04/05/2021 2200   BILIRUBINUR NEGATIVE 04/05/2021 2200   BILIRUBINUR small (A) 12/18/2020 1644   BILIRUBINUR n 12/04/2011 1014   KETONESUR NEGATIVE 04/05/2021 2200   PROTEINUR NEGATIVE 04/05/2021 2200   UROBILINOGEN negative 12/04/2011 1014   NITRITE NEGATIVE 04/05/2021 2200   LEUKOCYTESUR NEGATIVE 04/05/2021 2200   Sepsis Labs Invalid input(s): PROCALCITONIN,  WBC,   LACTICIDVEN Microbiology Recent Results (from the past 240 hour(s))  Resp Panel by RT-PCR (Flu A&B, Covid) Nasopharyngeal Swab     Status: None   Collection Time: 04/05/21  4:12 PM   Specimen: Nasopharyngeal Swab; Nasopharyngeal(NP) swabs in vial transport medium  Result Value Ref Range Status   SARS Coronavirus 2 by RT PCR NEGATIVE NEGATIVE Final    Comment: (NOTE) SARS-CoV-2  target nucleic acids are NOT DETECTED.  The SARS-CoV-2 RNA is generally detectable in upper respiratory specimens during the acute phase of infection. The lowest concentration of SARS-CoV-2 viral copies this assay can detect is 138 copies/mL. A negative result does not preclude SARS-Cov-2 infection and should not be used as the sole basis for treatment or other patient management decisions. A negative result may occur with  improper specimen collection/handling, submission of specimen other than nasopharyngeal swab, presence of viral mutation(s) within the areas targeted by this assay, and inadequate number of viral copies(<138 copies/mL). A negative result must be combined with clinical observations, patient history, and epidemiological information. The expected result is Negative.  Fact Sheet for Patients:  EntrepreneurPulse.com.au  Fact Sheet for Healthcare Providers:  IncredibleEmployment.be  This test is no t yet approved or cleared by the [place] FDA and  has been authorized for detection and/or diagnosis of SARS-CoV-2 by FDA under an Emergency Use Authorization (EUA). This EUA will remain  in effect (meaning this test can be used) for the duration of the COVID-19 declaration under Section 564(b)(1) of the Act, 21 U.S.C.section 360bbb-3(b)(1), unless the authorization is terminated  or revoked sooner.       Influenza A by PCR NEGATIVE NEGATIVE Final   Influenza B by PCR NEGATIVE NEGATIVE Final    Comment: (NOTE) The Xpert Xpress SARS-CoV-2/FLU/RSV plus  assay is intended as an aid in the diagnosis of influenza from Nasopharyngeal swab specimens and should not be used as a sole basis for treatment. Nasal washings and aspirates are unacceptable for Xpert Xpress SARS-CoV-2/FLU/RSV testing.  Fact Sheet for Patients: EntrepreneurPulse.com.au  Fact Sheet for Healthcare Providers: IncredibleEmployment.be  This test is not yet approved or cleared by the [place] FDA and has been authorized for detection and/or diagnosis of SARS-CoV-2 by FDA under an Emergency Use Authorization (EUA). This EUA will remain in effect (meaning this test can be used) for the duration of the COVID-19 declaration under Section 564(b)(1) of the Act, 21 U.S.C. section 360bbb-3(b)(1), unless the authorization is terminated or revoked.  Performed at [facility], [place] [address] [place], [place] [address]   Blood Culture (routine x 2)     Status: None   Collection Time: 04/05/21  4:46 PM   Specimen: BLOOD  Result Value Ref Range Status   Specimen Description   Final    BLOOD RIGHT ANTECUBITAL Performed at [place] [address] [place], [place] [address]    Special Requests   Final    BOTTLES DRAWN AEROBIC AND ANAEROBIC Blood Culture results may not be optimal due to an inadequate volume of blood received in culture bottles Performed at [place] [address] [place], [place] [address]    Culture   Final    NO GROWTH 5 DAYS Performed at [facility], [place] [address], [place], [place] [address]    Report Status 04/10/2021 FINAL  Final  Blood Culture (routine x 2)     Status: None   Collection Time: 04/05/21  5:38 PM   Specimen: BLOOD LEFT HAND  Result Value Ref Range Status   Specimen Description   Final    BLOOD LEFT HAND Performed at [place] [address] [place],  [address]    Special Requests   Final     BOTTLES DRAWN AEROBIC ONLY Blood Culture results may not be optimal due to an inadequate volume of blood received in culture bottles Performed  at [facility], [place] [address] [place], [place] [address]    Culture   Final    NO GROWTH 5 DAYS Performed at [facility], [place] [address], [place], [place] [address]    Report Status 04/10/2021 FINAL  Final  Urine culture     Status: Abnormal   Collection Time: 04/05/21 10:00 PM   Specimen: In/Out Cath Urine  Result Value Ref Range Status   Specimen Description   Final    IN/OUT CATH URINE Performed at [facility] [address], [place], [place] [address]    Special Requests   Final    NONE Performed at [facility], [place] [address] [place], [place] [address]    Culture (A)  Final    2,000 COLONIES/mL LACTOBACILLUS SPECIES Standardized susceptibility testing for this organism is not available. Performed at [facility], [place] [address], [place], [place] [address]    Report Status 04/07/2021 FINAL  Final    Procedures/Studies: CT ABDOMEN PELVIS WO CONTRAST  Result Date: 04/05/2021 CLINICAL DATA:  Abdominal pain, evaluate for cellulitis, recent ostomy for Crohn's disease EXAM: CT ABDOMEN AND PELVIS WITHOUT CONTRAST TECHNIQUE: Multidetector CT imaging of the abdomen and pelvis was performed following the standard protocol without IV contrast. COMPARISON:  01/08/2021 FINDINGS: Lower chest: No acute abnormality. Hepatobiliary: No solid liver abnormality is seen. Faintly calcified gallstones and/or sludge in the gallbladder. Gallbladder wall thickening, or biliary dilatation. Pancreas: Unremarkable. No pancreatic ductal dilatation or surrounding inflammatory changes. Spleen: Normal in size without significant abnormality. Adrenals/Urinary Tract: Adrenal glands are unremarkable. Kidneys are normal, without renal calculi, solid lesion, or hydronephrosis. Bladder is unremarkable.  Stomach/Bowel: Stomach is within normal limits. Appendix is surgically absent. Interval rectosigmoid resection with rectal stump, creation of transverse colon diverting ostomy in the right upper quadrant and left lower quadrant end colostomy. No evidence of acute bowel wall thickening, distention, or inflammatory changes. Vascular/Lymphatic: Aortic atherosclerosis. No enlarged abdominal or pelvic lymph nodes. Reproductive: Status post hysterectomy. Other: No abdominal wall hernia or abnormality. No abdominopelvic ascites. Musculoskeletal: No acute or significant osseous findings. IMPRESSION: 1. Interval rectosigmoid resection with rectal stump, creation of transverse colon diverting ostomy in the right upper quadrant and left lower quadrant end colostomy. 2. No noncontrast evidence of postoperative fluid collection or other complication. 3. No evidence of bowel obstruction. 4. Faintly calcified gallstones and/or sludge in the gallbladder without evidence of acute cholecystitis. Aortic Atherosclerosis (ICD10-I70.0). Electronically Signed   By: Eddie Candle M.D.   On: 04/05/2021 19:29   DG Chest 2 View  Result Date: 03/12/2021 CLINICAL DATA:  Confusion and history of recent bowel resection, initial encounter EXAM: CHEST - 2 VIEW COMPARISON:  05/09/2026 FINDINGS: The heart size and mediastinal contours are within normal limits. Both lungs are clear. The visualized skeletal structures are unremarkable. IMPRESSION: No active cardiopulmonary disease. Electronically Signed   By: Inez Catalina M.D.   On: 03/12/2021 16:40   CT Head Wo Contrast  Result Date: 03/12/2021 CLINICAL DATA:  Mental status change EXAM: CT HEAD WITHOUT CONTRAST TECHNIQUE: Contiguous axial images were obtained from the base of the skull through the vertex without intravenous contrast. COMPARISON:  None. FINDINGS: Brain: No acute territorial infarction, hemorrhage or intracranial mass. Mild atrophy. Minimal white matter hypodensity. Nonenlarged  ventricles Vascular: No hyperdense vessels.  No unexpected calcification Skull: Normal. Negative for fracture or focal lesion. Sinuses/Orbits: No acute finding. Other: None IMPRESSION: 1. No CT evidence for acute intracranial abnormality.  2. Atrophy and mild chronic small vessel ischemic change of the white matter Electronically Signed   By: Donavan Foil M.D.   On: 03/12/2021 16:27   DG Chest Port 1 View  Result Date: 04/05/2021 CLINICAL DATA:  Redness surrounding new stoma on abdomen, ostomy performed 03/25/2021, nausea and single episode of vomiting, question sepsis. History smoking, hypertension EXAM: PORTABLE CHEST 1 VIEW COMPARISON:  Portable exam 1632 hours compared to 03/12/2021 FINDINGS: Normal heart size, mediastinal contours, and pulmonary vascularity. Lungs hyperinflated but clear. No infiltrate, pleural effusion, or pneumothorax. No acute osseous findings. IMPRESSION: Hyperinflated lungs without acute infiltrate. Electronically Signed   By: Lavonia Dana M.D.   On: 04/05/2021 16:43     Time coordinating discharge: Over 30 minutes    Dwyane Dee, MD  [facility] 04/10/2021, 5:12 PM

## 2021-04-11 ENCOUNTER — Telehealth: Payer: Self-pay | Admitting: Family Medicine

## 2021-04-11 NOTE — Telephone Encounter (Signed)
   Telephone encounter was:  Unsuccessful.  04/11/2021 Name: Cynthia [name] MRN: [id number] DOB: [birth year]  Unsuccessful outbound call made today to assist with:  Transportation Needs   Outreach Attempt:  1st Attempt  Pt voicemail was full, unable to leave a message.   April Green Care Guide, Embedded Care Coordination [place], Care Management Phone: [phone] Email: [email or web address]

## 2021-04-16 ENCOUNTER — Telehealth: Payer: Self-pay | Admitting: Family Medicine

## 2021-04-16 NOTE — Telephone Encounter (Signed)
   Telephone encounter was:  Unsuccessful.  04/16/2021 Name: [name] MRN: [id number] DOB: 12/03/1960  Unsuccessful outbound call made today to assist with:  Transportation Needs   Outreach Attempt:  2nd Attempt  A HIPAA compliant voice message was left requesting a return call.  Instructed patient to call back at [phone].  April Green Care Guide, Embedded Care Coordination [place], Care Management Phone: [phone] Email: [email or web address]

## 2021-04-19 ENCOUNTER — Telehealth: Payer: Self-pay | Admitting: Family Medicine

## 2021-04-19 NOTE — Telephone Encounter (Signed)
   Telephone encounter was:  Unsuccessful.  04/19/2021 Name: Cynthia [name] MRN: [id number] DOB: 12/09/1960  Unsuccessful outbound call made today to assist with:  Transportation Needs   Outreach Attempt:  3rd Attempt.  Referral closed unable to contact patient. Will keep open until EOB on Monday 6/20 to give pt time to call me back.  A HIPAA compliant voice message was left requesting a return call.  Instructed patient to call back at [phone].  April Green Care Guide, Embedded Care Coordination [place], Care Management Phone: [phone] Email: [email or web address]

## 2021-05-16 ENCOUNTER — Ambulatory Visit: Payer: 59 | Admitting: Physician Assistant

## 2021-06-19 ENCOUNTER — Encounter: Payer: Self-pay | Admitting: Family Medicine

## 2021-06-19 ENCOUNTER — Other Ambulatory Visit: Payer: 59

## 2021-06-19 ENCOUNTER — Other Ambulatory Visit: Payer: Self-pay

## 2021-06-19 ENCOUNTER — Telehealth (INDEPENDENT_AMBULATORY_CARE_PROVIDER_SITE_OTHER): Payer: 59 | Admitting: Family Medicine

## 2021-06-19 VITALS — Temp 99.5°F | Wt 110.0 lb

## 2021-06-19 DIAGNOSIS — Z1152 Encounter for screening for COVID-19: Secondary | ICD-10-CM

## 2021-06-19 DIAGNOSIS — R0602 Shortness of breath: Secondary | ICD-10-CM | POA: Diagnosis not present

## 2021-06-19 DIAGNOSIS — K50919 Crohn's disease, unspecified, with unspecified complications: Secondary | ICD-10-CM | POA: Diagnosis not present

## 2021-06-19 LAB — POC COVID19 BINAXNOW: SARS Coronavirus 2 Ag: NEGATIVE

## 2021-06-19 NOTE — Progress Notes (Signed)
Sent SMS message for pt to call back for results of rapid covid test. PCR will be sent out  and should be back in two days. [place]

## 2021-06-19 NOTE — Progress Notes (Signed)
   Subjective:    Patient ID: Cynthia [name], female    DOB: Apr 21, 1961, 60 y.o.   MRN: [id number]  HPI Documentation for virtual audio and video telecommunications through [place] encounter: The patient was located at home. 2 patient identifiers used.  The provider was located in the office. The patient did consent to this visit and is aware of possible charges through their insurance for this visit. The other persons participating in this telemedicine service were none. Time spent on call was 5 minutes and in review of previous records >15 minutes total for counseling and coordination of care. This virtual service is not related to other E/M service within previous 7 days.  She states that on Sunday she became slightly short of breath with a sore throat as well as some postnasal drainage.  She also has had some vomiting.  She does have underlying Crohn's colitis and has a bag in place at the present time.  She continues on Nexium, Effexor and Mirapex otherwise is not on any medications.  She has had the 4 vaccines for COVID.  Review of Systems     Objective:   Physical Exam Alert and in no distress.  Breathing pattern appears normal.       Assessment & Plan:  Encounter for screening for COVID-19 - Plan: Novel Coronavirus, NAA ([facility]), POC COVID-19 BinaxNow  Shortness of breath - Plan: Novel Coronavirus, NAA ([facility]), POC COVID-19 BinaxNow  Crohn's disease with complication, unspecified gastrointestinal tract location ([place])

## 2021-06-20 LAB — NOVEL CORONAVIRUS, NAA: SARS-CoV-2, NAA: NOT DETECTED

## 2021-06-20 LAB — SARS-COV-2, NAA 2 DAY TAT

## 2021-06-25 ENCOUNTER — Emergency Department (HOSPITAL_COMMUNITY): Payer: Medicaid Other

## 2021-06-25 ENCOUNTER — Encounter (HOSPITAL_COMMUNITY): Payer: Self-pay

## 2021-06-25 ENCOUNTER — Other Ambulatory Visit: Payer: Self-pay

## 2021-06-25 ENCOUNTER — Inpatient Hospital Stay (HOSPITAL_COMMUNITY)
Admission: EM | Admit: 2021-06-25 | Discharge: 2021-06-28 | DRG: 872 | Disposition: A | Discharge status: Home / Self Care | Payer: Medicaid Other | Attending: Internal Medicine | Admitting: Internal Medicine

## 2021-06-25 DIAGNOSIS — E860 Dehydration: Secondary | ICD-10-CM | POA: Diagnosis present

## 2021-06-25 DIAGNOSIS — N179 Acute kidney failure, unspecified: Secondary | ICD-10-CM | POA: Diagnosis present

## 2021-06-25 DIAGNOSIS — K219 Gastro-esophageal reflux disease without esophagitis: Secondary | ICD-10-CM | POA: Diagnosis present

## 2021-06-25 DIAGNOSIS — F32A Depression, unspecified: Secondary | ICD-10-CM | POA: Diagnosis present

## 2021-06-25 DIAGNOSIS — K5000 Crohn's disease of small intestine without complications: Secondary | ICD-10-CM | POA: Diagnosis present

## 2021-06-25 DIAGNOSIS — Z888 Allergy status to other drugs, medicaments and biological substances status: Secondary | ICD-10-CM

## 2021-06-25 DIAGNOSIS — E871 Hypo-osmolality and hyponatremia: Secondary | ICD-10-CM | POA: Diagnosis present

## 2021-06-25 DIAGNOSIS — I10 Essential (primary) hypertension: Secondary | ICD-10-CM | POA: Diagnosis present

## 2021-06-25 DIAGNOSIS — K529 Noninfective gastroenteritis and colitis, unspecified: Secondary | ICD-10-CM

## 2021-06-25 DIAGNOSIS — R109 Unspecified abdominal pain: Secondary | ICD-10-CM

## 2021-06-25 DIAGNOSIS — F419 Anxiety disorder, unspecified: Secondary | ICD-10-CM | POA: Diagnosis present

## 2021-06-25 DIAGNOSIS — G2581 Restless legs syndrome: Secondary | ICD-10-CM | POA: Diagnosis present

## 2021-06-25 DIAGNOSIS — R112 Nausea with vomiting, unspecified: Secondary | ICD-10-CM | POA: Diagnosis present

## 2021-06-25 DIAGNOSIS — K8020 Calculus of gallbladder without cholecystitis without obstruction: Secondary | ICD-10-CM

## 2021-06-25 DIAGNOSIS — Z933 Colostomy status: Secondary | ICD-10-CM

## 2021-06-25 DIAGNOSIS — Z79899 Other long term (current) drug therapy: Secondary | ICD-10-CM

## 2021-06-25 DIAGNOSIS — Z90710 Acquired absence of both cervix and uterus: Secondary | ICD-10-CM

## 2021-06-25 DIAGNOSIS — A419 Sepsis, unspecified organism: Principal | ICD-10-CM

## 2021-06-25 DIAGNOSIS — Z833 Family history of diabetes mellitus: Secondary | ICD-10-CM

## 2021-06-25 DIAGNOSIS — Z20822 Contact with and (suspected) exposure to covid-19: Secondary | ICD-10-CM | POA: Diagnosis present

## 2021-06-25 DIAGNOSIS — F17210 Nicotine dependence, cigarettes, uncomplicated: Secondary | ICD-10-CM | POA: Diagnosis present

## 2021-06-25 DIAGNOSIS — Z90722 Acquired absence of ovaries, bilateral: Secondary | ICD-10-CM

## 2021-06-25 DIAGNOSIS — Z832 Family history of diseases of the blood and blood-forming organs and certain disorders involving the immune mechanism: Secondary | ICD-10-CM

## 2021-06-25 DIAGNOSIS — I341 Nonrheumatic mitral (valve) prolapse: Secondary | ICD-10-CM | POA: Diagnosis present

## 2021-06-25 DIAGNOSIS — E872 Acidosis: Secondary | ICD-10-CM | POA: Diagnosis present

## 2021-06-25 DIAGNOSIS — I248 Other forms of acute ischemic heart disease: Secondary | ICD-10-CM | POA: Diagnosis present

## 2021-06-25 DIAGNOSIS — L03311 Cellulitis of abdominal wall: Secondary | ICD-10-CM | POA: Diagnosis present

## 2021-06-25 LAB — CBC
HCT: 45.7 % (ref 36.0–46.0)
Hemoglobin: 15.3 g/dL — ABNORMAL HIGH (ref 12.0–15.0)
MCH: 29.4 pg (ref 26.0–34.0)
MCHC: 33.5 g/dL (ref 30.0–36.0)
MCV: 87.9 fL (ref 80.0–100.0)
Platelets: 312 10*3/uL (ref 150–400)
RBC: 5.2 MIL/uL — ABNORMAL HIGH (ref 3.87–5.11)
RDW: 12.5 % (ref 11.5–15.5)
WBC: 16.3 10*3/uL — ABNORMAL HIGH (ref 4.0–10.5)
nRBC: 0 % (ref 0.0–0.2)

## 2021-06-25 LAB — COMPREHENSIVE METABOLIC PANEL
ALT: 15 U/L (ref 0–44)
AST: 20 U/L (ref 15–41)
Albumin: 4.2 g/dL (ref 3.5–5.0)
Alkaline Phosphatase: 63 U/L (ref 38–126)
Anion gap: 14 (ref 5–15)
BUN: 84 mg/dL — ABNORMAL HIGH (ref 8–23)
CO2: 12 mmol/L — ABNORMAL LOW (ref 22–32)
Calcium: 9.8 mg/dL (ref 8.9–10.3)
Chloride: 95 mmol/L — ABNORMAL LOW (ref 98–111)
Creatinine, Ser: 3.06 mg/dL — ABNORMAL HIGH (ref 0.44–1.00)
GFR, Estimated: 17 mL/min — ABNORMAL LOW (ref >60)
Glucose, Bld: 95 mg/dL (ref 70–99)
Potassium: 4.5 mmol/L (ref 3.5–5.1)
Sodium: 121 mmol/L — ABNORMAL LOW (ref 135–145)
Total Bilirubin: 0.6 mg/dL (ref 0.3–1.2)
Total Protein: 7.9 g/dL (ref 6.5–8.1)

## 2021-06-25 LAB — LIPASE, BLOOD: Lipase: 149 U/L — ABNORMAL HIGH (ref 11–51)

## 2021-06-25 LAB — TROPONIN I (HIGH SENSITIVITY)
Troponin I (High Sensitivity): 23 ng/L — ABNORMAL HIGH (ref <18)
Troponin I (High Sensitivity): 22 ng/L — ABNORMAL HIGH (ref <18)

## 2021-06-25 LAB — LACTIC ACID, PLASMA: Lactic Acid, Venous: 1.4 mmol/L (ref 0.5–1.9)

## 2021-06-25 MED ORDER — LACTATED RINGERS IV BOLUS
1000.0000 mL | Freq: Once | INTRAVENOUS | Status: AC
  Administered 2021-06-25: 1000 mL via INTRAVENOUS

## 2021-06-25 MED ORDER — LACTATED RINGERS IV BOLUS (SEPSIS)
500.0000 mL | Freq: Once | INTRAVENOUS | Status: AC
  Administered 2021-06-25: 500 mL via INTRAVENOUS

## 2021-06-25 MED ORDER — ONDANSETRON HCL 4 MG/2ML IJ SOLN
4.0000 mg | Freq: Once | INTRAMUSCULAR | Status: AC
  Administered 2021-06-25: 4 mg via INTRAVENOUS
  Filled 2021-06-25: qty 2

## 2021-06-25 MED ORDER — LACTATED RINGERS IV BOLUS (SEPSIS)
1000.0000 mL | Freq: Once | INTRAVENOUS | Status: AC
  Administered 2021-06-25: 1000 mL via INTRAVENOUS

## 2021-06-25 MED ORDER — LACTATED RINGERS IV SOLN: INTRAVENOUS | Status: DC

## 2021-06-25 MED ORDER — METRONIDAZOLE 500 MG/100ML IV SOLN
500.0000 mg | Freq: Once | INTRAVENOUS | Status: AC
  Administered 2021-06-26: 500 mg via INTRAVENOUS
  Filled 2021-06-25: qty 100

## 2021-06-25 MED ORDER — FENTANYL CITRATE (PF) 100 MCG/2ML IJ SOLN
50.0000 ug | Freq: Once | INTRAMUSCULAR | Status: AC
  Administered 2021-06-25: 50 ug via INTRAVENOUS
  Filled 2021-06-25: qty 2

## 2021-06-25 MED ORDER — SODIUM CHLORIDE 0.9 % IV SOLN
2.0000 g | Freq: Once | INTRAVENOUS | Status: AC
  Administered 2021-06-25: 2 g via INTRAVENOUS
  Filled 2021-06-25: qty 20

## 2021-06-25 NOTE — ED Triage Notes (Signed)
Pt BIB EMS from home - pt came for abd pain and  persistent vomiting x 3 days - colostomy bag in place .     Also endorses CP and SOB.  GCS 15

## 2021-06-25 NOTE — ED Provider Notes (Signed)
  Provider Note MRN:  [id number]  Arrival date & time: 06/25/21    ED Course and Medical Decision Making  Assumed care from Dr. Karle Starch at shift change.  History of colostomy here with vomiting, leukocytosis, awaiting CT imaging.  Soft blood pressures, providing IV fluids, empiric antibiotics.  CT imaging without acute or emergent process.  Labs revealing significant hyponatremia and AKI, will admit to medicine for further management.  .Critical Care  Date/Time: 06/25/2021 11:51 PM Performed by: Maudie Flakes, MD Authorized by: Maudie Flakes, MD   Critical care provider statement:    Critical care time (minutes):  33   Critical care was necessary to treat or prevent imminent or life-threatening deterioration of the following conditions:  Metabolic crisis   Critical care was time spent personally by me on the following activities:  Discussions with consultants, evaluation of patient's response to treatment, examination of patient, ordering and performing treatments and interventions, ordering and review of laboratory studies, ordering and review of radiographic studies, pulse oximetry, re-evaluation of patient's condition, obtaining history from patient or surrogate and review of old charts   I assumed direction of critical care for this patient from another provider in my specialty: yes     Care discussed with: admitting provider    Final Clinical Impressions(s) / ED Diagnoses     ICD-10-CM   1. Nausea and vomiting, intractability of vomiting not specified, unspecified vomiting type  R11.2     2. Hyponatremia  E87.1     3. AKI (acute kidney injury) [facility]  N17.9       ED Discharge Orders     None       Discharge Instructions   None     Barth Kirks. Sedonia Small, [place] [email or web address]    Maudie Flakes, MD 06/25/21 [phone]

## 2021-06-25 NOTE — Sepsis Progress Note (Signed)
Following per sepsis protocol   

## 2021-06-25 NOTE — ED Notes (Signed)
Patient transported to CT 

## 2021-06-25 NOTE — ED Provider Notes (Signed)
[facility] DEPART[facility] Provider Note  CSN: [id number] Arrival date & time: 06/25/21 1933    History Chief Complaint  Patient presents with  . Abdominal Pain  . Emesis    Cynthia [name] is a 60 y.o. female with history of Crohn's with recurrent SBO in April 2022 had ileocolectomy with ileostomy presents to the ED and reports 3 days of diffuse abdominal pain, persistent vomiting. Liquid stool in her bag. No fever, no blood in emesis or stool.    Past Medical History:  Diagnosis Date  . Anemia 2012  . Anxiety   . Bronchitis   . Crohn's ileitis ([place])   . Depression   . GERD (gastroesophageal reflux disease)   . Headache(784.0)    MIGRAINE  . Heart murmur   . Hiatal hernia   . Hypertension   . IBS (irritable bowel syndrome)   . Incontinence   . MVP (mitral valve prolapse)   . RLS (restless legs syndrome)   . Tubular adenoma of colon 08/2019    Past Surgical History:  Procedure Laterality Date  . ABDOMINAL HYSTERECTOMY  06/17/2011   Procedure: HYSTERECTOMY ABDOMINAL;  Surgeon: Arloa Koh;  Location: [facility];  Service: Gynecology;  Laterality: N/A;  Converted to Abdominal Hysterectomy with lysis of adhesions   . ANTERIOR AND POSTERIOR REPAIR  02/25/2012   Procedure: ANTERIOR (CYSTOCELE) AND POSTERIOR REPAIR (RECTOCELE);  Surgeon: Daria Pastures, MD;  Location: [facility];  Service: Gynecology;  Laterality: N/A;  ANterior cystocele repair ONLY  . BIOPSY  08/29/2019   Procedure: BIOPSY;  Surgeon: Lavena Bullion, DO;  Location: [facility];  Service: Gastroenterology;;  . BLADDER SUSPENSION  02/25/2012   Procedure: TRANSVAGINAL TAPE (TVT) PROCEDURE;  Surgeon: Daria Pastures, MD;  Location: [facility];  Service: Gynecology;  Laterality: N/A;  . COLONOSCOPY WITH PROPOFOL N/A 08/29/2019   Procedure: COLONOSCOPY WITH PROPOFOL;  Surgeon: Lavena Bullion, DO;  Location: [facility];  Service: Gastroenterology;  Laterality: N/A;  . CYSTOSCOPY  02/25/2012    Procedure: CYSTOSCOPY;  Surgeon: Daria Pastures, MD;  Location: [facility];  Service: Gynecology;  Laterality: N/A;  . ILEOSTOMY    . KNEE ARTHROSCOPY  2010  . LAPAROSCOPIC ASSISTED VAGINAL HYSTERECTOMY  06/17/2011   Procedure: LAPAROSCOPIC ASSISTED VAGINAL HYSTERECTOMY;  Surgeon: Arloa Koh;  Location: [facility];  Service: Gynecology;  Laterality: N/A;  Attempted   . POLYPECTOMY  08/29/2019   Procedure: POLYPECTOMY;  Surgeon: Lavena Bullion, DO;  Location: [facility];  Service: Gastroenterology;;  . SALPINGOOPHORECTOMY  06/17/2011   Procedure: SALPINGO OOPHERECTOMY;  Surgeon: Arloa Koh;  Location: [facility];  Service: Gynecology;  Laterality: Bilateral;  . TONSILLECTOMY      Family History  Problem Relation Age of Onset  . Diabetes Mother   . Dementia Mother   . Clotting disorder Father   . Macular degeneration Father   . Crohn's disease Sister   . Colon cancer Neg Hx   . Esophageal cancer Neg Hx   . Pancreatic cancer Neg Hx   . Stomach cancer Neg Hx   . Liver disease Neg Hx     Social History   Tobacco Use  . Smoking status: Some Days    Packs/day: 0.00    Types: Cigarettes  . Smokeless tobacco: Never  . Tobacco comments:    none in 2 weeks as of 12/04/20  Vaping Use  . Vaping Use: Former  Substance Use Topics  . Alcohol use: Not Currently  .  Drug use: Not Currently    Types: Cocaine    Comment: clean x 8 weeks     Home Medications Prior to Admission medications   Medication Sig Start Date End Date Taking? Authorizing Provider  acetaminophen (TYLENOL) 500 MG tablet Take 2 tablets (1,000 mg total) by mouth every 6 (six) hours as needed for moderate pain. Patient not taking: Reported on 06/19/2021 04/09/21   Dwyane Dee, MD  cholestyramine Lucrezia Starch) 4 g packet Take 1 packet by mouth 2 (two) times daily for 14 days. Patient not taking: Reported on 06/19/2021 04/09/21 04/23/21  Dwyane Dee, MD  diphenhydramine-acetaminophen (TYLENOL PM) 25-500 MG TABS tablet Take  2 tablets by mouth at bedtime as needed (sleep/pain). Patient not taking: Reported on 06/19/2021    [provider]  esomeprazole (NEXIUM) 40 MG capsule TAKE ONE (1) CAPSULE BY MOUTH 2 TIMES DAILY Patient taking differently: Take 40 mg by mouth 2 (two) times daily before a meal. 03/05/21   Denita Lung, MD  loperamide (IMODIUM) 2 MG capsule Take 2 mg by mouth daily as needed for diarrhea or loose stools. Patient not taking: Reported on 06/19/2021 03/23/21   [provider]  losartan-hydrochlorothiazide (HYZAAR) 100-12.5 MG tablet Take 1 tablet by mouth daily. Patient not taking: Reported on 06/19/2021 08/03/20   Denita Lung, MD  polycarbophil (FIBERCON) 625 MG tablet Take 1 tablet (625 mg total) by mouth 2 (two) times daily. Patient not taking: Reported on 06/19/2021 01/13/21   Rai, Vernelle Emerald, MD  pramipexole (MIRAPEX) 1 MG tablet Take 1 tablet (1 mg total) by mouth daily. 03/05/21   Denita Lung, MD  venlafaxine XR (EFFEXOR-XR) 150 MG 24 hr capsule Take 2 capsules (300 mg total) by mouth daily with breakfast. 03/05/21   Denita Lung, MD     Allergies    Iodine   Review of Systems   Review of Systems A comprehensive review of systems was completed and negative except as noted in HPI.    Physical Exam BP (!) 74/56   Pulse 98   Temp 97.6 F (36.4 C) (Oral)   Resp 15   Ht 5' 4"  (1.626 m)   Wt 49.9 kg   LMP 06/10/2011 (Exact Date)   SpO2 100%   BMI 18.88 kg/m   Physical Exam Vitals and nursing note reviewed.  Constitutional:      Appearance: Normal appearance.  HENT:     Head: Normocephalic and atraumatic.     Nose: Nose normal.     Mouth/Throat:     Mouth: Mucous membranes are moist.  Eyes:     Extraocular Movements: Extraocular movements intact.     Conjunctiva/sclera: Conjunctivae normal.  Cardiovascular:     Rate and Rhythm: Tachycardia present.  Pulmonary:     Effort: Pulmonary effort is normal.     Breath sounds: Normal breath sounds.   Abdominal:     General: Abdomen is flat.     Palpations: Abdomen is soft.     Tenderness: There is generalized abdominal tenderness.     Comments: Ileostomy in RLQ with liquid stool leaking around ostomy device  Musculoskeletal:        General: No swelling. Normal range of motion.     Cervical back: Neck supple.  Skin:    General: Skin is warm and dry.  Neurological:     General: No focal deficit present.     Mental Status: She is alert.  Psychiatric:        Mood and  Affect: Mood normal.     ED Results / Procedures / Treatments   Labs (all labs ordered are listed, but only abnormal results are displayed) Labs Reviewed  CBC  COMPREHENSIVE METABOLIC PANEL  LIPASE, BLOOD  TROPONIN I (HIGH SENSITIVITY)    EKG EKG Interpretation  Date/Time:  Tuesday June 25 2021 19:56:45 EDT Ventricular Rate:  101 PR Interval:  141 QRS Duration: 90 QT Interval:  342 QTC Calculation: 444 R Axis:   -85 Text Interpretation: Sinus tachycardia Right atrial enlargement Inferior infarct, old Anterior infarct, old Since last tracing P-wave amplitude is more pronounced Confirmed by Calvert Cantor [phone]) on 06/25/2021 8:51:30 PM  Radiology No results found.  Procedures Procedures  Medications Ordered in the ED Medications  fentaNYL (SUBLIMAZE) injection 50 mcg (has no administration in time range)  ondansetron (ZOFRAN) injection 4 mg (has no administration in time range)  lactated ringers bolus 1,000 mL (has no administration in time range)     MDM Rules/Calculators/A&P MDM Patient with known history of Crohn's, now with ileostomy here with vomiting and abdominal pain. Will check labs, pain/nausea meds for comfort. IVF for GI losses.  ED Course  I have reviewed the triage vital signs and the nursing notes.  Pertinent labs & imaging results that were available during my care of the patient were reviewed by me and considered in my medical decision making (see chart for  details).  Clinical Course as of 06/25/21 2311  Tue Jun 25, 2021  2149 BP is borderline low, with WBC elevated will add cultures and lactic acid. No definite source yet.  [CS]  2241 First trop mildly elevated of unclear significance. She does not have any chest pain. Ordered by RN by protocol.  [CS]  2306 Care of the patient signed out to Dr. Sedonia Small at the change of shift. BP improved but lower now. Will add more LR and empiric Abx for possible intra-abdominal source of sepsis. CT abd and pel ordered as well.  [CS]    Clinical Course User Index [CS] Truddie Hidden, MD    Final Clinical Impression(s) / ED Diagnoses Final diagnoses:  None    Rx / DC Orders ED Discharge Orders     None        Truddie Hidden, MD 06/25/21 2311

## 2021-06-26 ENCOUNTER — Observation Stay (HOSPITAL_COMMUNITY): Payer: Medicaid Other

## 2021-06-26 DIAGNOSIS — R112 Nausea with vomiting, unspecified: Secondary | ICD-10-CM | POA: Diagnosis present

## 2021-06-26 DIAGNOSIS — E860 Dehydration: Secondary | ICD-10-CM | POA: Diagnosis present

## 2021-06-26 DIAGNOSIS — Z888 Allergy status to other drugs, medicaments and biological substances status: Secondary | ICD-10-CM | POA: Diagnosis not present

## 2021-06-26 DIAGNOSIS — E871 Hypo-osmolality and hyponatremia: Secondary | ICD-10-CM | POA: Diagnosis not present

## 2021-06-26 DIAGNOSIS — F17210 Nicotine dependence, cigarettes, uncomplicated: Secondary | ICD-10-CM | POA: Diagnosis present

## 2021-06-26 DIAGNOSIS — K219 Gastro-esophageal reflux disease without esophagitis: Secondary | ICD-10-CM | POA: Diagnosis present

## 2021-06-26 DIAGNOSIS — Z832 Family history of diseases of the blood and blood-forming organs and certain disorders involving the immune mechanism: Secondary | ICD-10-CM | POA: Diagnosis not present

## 2021-06-26 DIAGNOSIS — A419 Sepsis, unspecified organism: Secondary | ICD-10-CM

## 2021-06-26 DIAGNOSIS — Z933 Colostomy status: Secondary | ICD-10-CM | POA: Diagnosis not present

## 2021-06-26 DIAGNOSIS — Z833 Family history of diabetes mellitus: Secondary | ICD-10-CM | POA: Diagnosis not present

## 2021-06-26 DIAGNOSIS — I341 Nonrheumatic mitral (valve) prolapse: Secondary | ICD-10-CM | POA: Diagnosis present

## 2021-06-26 DIAGNOSIS — Z90722 Acquired absence of ovaries, bilateral: Secondary | ICD-10-CM | POA: Diagnosis not present

## 2021-06-26 DIAGNOSIS — L03311 Cellulitis of abdominal wall: Secondary | ICD-10-CM | POA: Diagnosis present

## 2021-06-26 DIAGNOSIS — K5000 Crohn's disease of small intestine without complications: Secondary | ICD-10-CM | POA: Diagnosis present

## 2021-06-26 DIAGNOSIS — Z90710 Acquired absence of both cervix and uterus: Secondary | ICD-10-CM | POA: Diagnosis not present

## 2021-06-26 DIAGNOSIS — I248 Other forms of acute ischemic heart disease: Secondary | ICD-10-CM | POA: Diagnosis present

## 2021-06-26 DIAGNOSIS — Z79899 Other long term (current) drug therapy: Secondary | ICD-10-CM | POA: Diagnosis not present

## 2021-06-26 DIAGNOSIS — F419 Anxiety disorder, unspecified: Secondary | ICD-10-CM | POA: Diagnosis present

## 2021-06-26 DIAGNOSIS — E872 Acidosis: Secondary | ICD-10-CM | POA: Diagnosis present

## 2021-06-26 DIAGNOSIS — I10 Essential (primary) hypertension: Secondary | ICD-10-CM | POA: Diagnosis present

## 2021-06-26 DIAGNOSIS — K529 Noninfective gastroenteritis and colitis, unspecified: Secondary | ICD-10-CM | POA: Diagnosis present

## 2021-06-26 DIAGNOSIS — G2581 Restless legs syndrome: Secondary | ICD-10-CM | POA: Diagnosis present

## 2021-06-26 DIAGNOSIS — R109 Unspecified abdominal pain: Secondary | ICD-10-CM

## 2021-06-26 DIAGNOSIS — F32A Depression, unspecified: Secondary | ICD-10-CM | POA: Diagnosis present

## 2021-06-26 DIAGNOSIS — Z20822 Contact with and (suspected) exposure to covid-19: Secondary | ICD-10-CM | POA: Diagnosis present

## 2021-06-26 DIAGNOSIS — N179 Acute kidney failure, unspecified: Secondary | ICD-10-CM | POA: Diagnosis present

## 2021-06-26 LAB — GASTROINTESTINAL PANEL BY PCR, STOOL (REPLACES STOOL CULTURE)

## 2021-06-26 LAB — BASIC METABOLIC PANEL
Anion gap: 8 (ref 5–15)
BUN: 69 mg/dL — ABNORMAL HIGH (ref 8–23)
CO2: 15 mmol/L — ABNORMAL LOW (ref 22–32)
Calcium: 8.2 mg/dL — ABNORMAL LOW (ref 8.9–10.3)
Chloride: 103 mmol/L (ref 98–111)
Creatinine, Ser: 2.38 mg/dL — ABNORMAL HIGH (ref 0.44–1.00)
GFR, Estimated: 23 mL/min — ABNORMAL LOW (ref >60)
Glucose, Bld: 101 mg/dL — ABNORMAL HIGH (ref 70–99)
Potassium: 3.6 mmol/L (ref 3.5–5.1)
Sodium: 126 mmol/L — ABNORMAL LOW (ref 135–145)

## 2021-06-26 LAB — URINALYSIS, COMPLETE (UACMP) WITH MICROSCOPIC
Bacteria, UA: NONE SEEN
Bilirubin Urine: NEGATIVE
Glucose, UA: NEGATIVE mg/dL
Hgb urine dipstick: NEGATIVE
Ketones, ur: NEGATIVE mg/dL
Nitrite: NEGATIVE
Protein, ur: NEGATIVE mg/dL
Specific Gravity, Urine: 1.013 (ref 1.005–1.030)
pH: 5 (ref 5.0–8.0)

## 2021-06-26 LAB — CBC
HCT: 36.3 % (ref 36.0–46.0)
Hemoglobin: 12 g/dL (ref 12.0–15.0)
MCH: 29.5 pg (ref 26.0–34.0)
MCHC: 33.1 g/dL (ref 30.0–36.0)
MCV: 89.2 fL (ref 80.0–100.0)
Platelets: 232 10*3/uL (ref 150–400)
RBC: 4.07 MIL/uL (ref 3.87–5.11)
RDW: 12.4 % (ref 11.5–15.5)
WBC: 13.1 10*3/uL — ABNORMAL HIGH (ref 4.0–10.5)
nRBC: 0 % (ref 0.0–0.2)

## 2021-06-26 LAB — RESP PANEL BY RT-PCR (FLU A&B, COVID) ARPGX2
Influenza A by PCR: NEGATIVE
Influenza B by PCR: NEGATIVE
SARS Coronavirus 2 by RT PCR: NEGATIVE

## 2021-06-26 LAB — OSMOLALITY: Osmolality: 288 mOsm/kg (ref 275–295)

## 2021-06-26 LAB — PROCALCITONIN: Procalcitonin: <0.1 ng/mL

## 2021-06-26 LAB — C DIFFICILE QUICK SCREEN W PCR REFLEX
C Diff antigen: NEGATIVE
C Diff interpretation: NOT DETECTED
C Diff toxin: NEGATIVE

## 2021-06-26 MED ORDER — FENTANYL CITRATE (PF) 100 MCG/2ML IJ SOLN
12.5000 ug | INTRAMUSCULAR | Status: DC | PRN
  Administered 2021-06-26: 12.5 ug via INTRAVENOUS
  Filled 2021-06-26: qty 2

## 2021-06-26 MED ORDER — SODIUM CHLORIDE 0.9 % IV SOLN
2.0000 g | INTRAVENOUS | Status: DC
  Administered 2021-06-26: 2 g via INTRAVENOUS
  Filled 2021-06-26: qty 20

## 2021-06-26 MED ORDER — VANCOMYCIN HCL 750 MG/150ML IV SOLN: 750.0000 mg | INTRAVENOUS | Status: DC

## 2021-06-26 MED ORDER — PANTOPRAZOLE SODIUM 40 MG PO TBEC
40.0000 mg | DELAYED_RELEASE_TABLET | Freq: Two times a day (BID) | ORAL | Status: DC
Start: 2021-06-26 — End: 2021-06-28
  Administered 2021-06-26 – 2021-06-28 (×6): 40 mg via ORAL
  Filled 2021-06-26 (×6): qty 1

## 2021-06-26 MED ORDER — VANCOMYCIN HCL IN DEXTROSE 1-5 GM/200ML-% IV SOLN
1000.0000 mg | Freq: Once | INTRAVENOUS | Status: AC
  Administered 2021-06-26: 1000 mg via INTRAVENOUS
  Filled 2021-06-26: qty 200

## 2021-06-26 MED ORDER — ACETAMINOPHEN 650 MG RE SUPP: 650.0000 mg | Freq: Four times a day (QID) | RECTAL | Status: DC | PRN

## 2021-06-26 MED ORDER — FENTANYL CITRATE PF 50 MCG/ML IJ SOSY
12.5000 ug | PREFILLED_SYRINGE | INTRAMUSCULAR | Status: DC | PRN
  Administered 2021-06-26: 12.5 ug via INTRAVENOUS
  Filled 2021-06-26: qty 1

## 2021-06-26 MED ORDER — PRAMIPEXOLE DIHYDROCHLORIDE 1 MG PO TABS: 1.0000 mg | ORAL_TABLET | Freq: Every day | ORAL | Status: DC

## 2021-06-26 MED ORDER — LACTATED RINGERS IV BOLUS
500.0000 mL | Freq: Once | INTRAVENOUS | Status: AC
  Administered 2021-06-26: 500 mL via INTRAVENOUS

## 2021-06-26 MED ORDER — VENLAFAXINE HCL ER 150 MG PO CP24
300.0000 mg | ORAL_CAPSULE | Freq: Every day | ORAL | Status: DC
  Administered 2021-06-26 – 2021-06-28 (×3): 300 mg via ORAL
  Filled 2021-06-26 (×2): qty 2
  Filled 2021-06-26: qty 4

## 2021-06-26 MED ORDER — ACETAMINOPHEN 325 MG PO TABS
650.0000 mg | ORAL_TABLET | Freq: Four times a day (QID) | ORAL | Status: DC | PRN
  Administered 2021-06-26 – 2021-06-28 (×3): 650 mg via ORAL
  Filled 2021-06-26 (×3): qty 2

## 2021-06-26 MED ORDER — HEPARIN SODIUM (PORCINE) 5000 UNIT/ML IJ SOLN
5000.0000 [IU] | Freq: Three times a day (TID) | INTRAMUSCULAR | Status: DC
  Administered 2021-06-26 – 2021-06-28 (×7): 5000 [IU] via SUBCUTANEOUS
  Filled 2021-06-26 (×7): qty 1

## 2021-06-26 MED ORDER — METRONIDAZOLE 500 MG/100ML IV SOLN
500.0000 mg | Freq: Two times a day (BID) | INTRAVENOUS | Status: DC
  Administered 2021-06-26 (×2): 500 mg via INTRAVENOUS
  Filled 2021-06-26 (×2): qty 100

## 2021-06-26 MED ORDER — ONDANSETRON HCL 4 MG/2ML IJ SOLN: 4.0000 mg | Freq: Four times a day (QID) | INTRAMUSCULAR | Status: DC | PRN

## 2021-06-26 MED ORDER — LACTATED RINGERS IV SOLN: INTRAVENOUS | Status: AC

## 2021-06-26 NOTE — ED Notes (Signed)
Pt  HR has been fluctuating to 180s. EKG done and showed to Dr Sedonia Small and Dr. Oneita Kras. States continue monitor pt as her EKg showed sinus tachy. Pt is GCS 15

## 2021-06-26 NOTE — ED Notes (Signed)
Colostomy bag changed.

## 2021-06-26 NOTE — ED Notes (Signed)
Wound care/ ostomy nurse at the bedside,  Complete change of ostomy bag done.  Colostomy continues to have large amount of loose watery diarrhea

## 2021-06-26 NOTE — ED Notes (Signed)
Pt was get up from the bed to  Bedside commode -  Denies dizzy/ CP/SOB. Pt is GCS 15

## 2021-06-26 NOTE — ED Notes (Signed)
Gerlene Fee RN has been notified of patient admission bed assignment and asked to look at the information

## 2021-06-26 NOTE — ED Notes (Signed)
Dr Remi Haggard is aware of the low blood pressure reading,  Currently at the bedside discussing plan of care.  Plans to keep IV fluids at 150 ml/hr

## 2021-06-26 NOTE — Progress Notes (Signed)
Patient ID: Cynthia [name], female   DOB: 1961-02-21, 60 y.o.   MRN: [id number] Patient admitted earlier this morning for abdominal pain, nausea and vomiting; CT of abdomen and pelvis was negative for any acute findings.  Patient seen and examined at bedside.  Plan of care discussed with her.  I have reviewed patient's medical records including this morning's H&P, current vitals, labs, medications myself.  Continue IV fluids and empiric antibiotics; repeat a.m. labs.  Follow stool studies.

## 2021-06-26 NOTE — ED Notes (Addendum)
Emptied her colostomy bag 3x since 3am watery approx 500 ml. BP is still but MAP has been over 65. Cap refill < 2 sec. GCS 15. Informed Hospitalist

## 2021-06-26 NOTE — ED Notes (Signed)
Resting quietly when checked.  Continuous cardiac monitor in place.  No distress noted

## 2021-06-26 NOTE — ED Notes (Signed)
Colostomy bag filled with loose watery stool- emptied.  There is leakage around the bag.  Patient was cleaned and prefers to tape around the bag and keep the colostomy bag emptied more frequently.  Warm blankets provide for comfort

## 2021-06-26 NOTE — Progress Notes (Signed)
Pharmacy Antibiotic Note  Cynthia [name] is a 60 y.o. female admitted on 06/25/2021 with abdominal wall cellulitis.  Pharmacy has been consulted for vancomycin dosing. Note AKI with baseline SCr < 1.0  Plan: Vancomycin 1g IV x 1, then 725m IV q48h for estimated AUC 469 using SCr 2.38, Vd 0.72L/kg (goal AUC 400-550) Consider checking vancomycin levels at steady state CTX/Flagyl per MD Follow up renal function & cultures   Height: 5' 4"  (162.6 cm) Weight: 49.9 kg (110 lb) IBW/kg (Calculated) : 54.7  Temp (24hrs), Avg:97.7 F (36.5 C), Min:97.6 F (36.4 C), Max:97.7 F (36.5 C)  Recent Labs  Lab 06/25/21 2028 06/25/21 2125 06/25/21 2151 06/26/21 0422  WBC 16.3*  --   --  13.1*  CREATININE  --  3.06*  --  2.38*  LATICACIDVEN  --   --  1.4  --     Estimated Creatinine Clearance: 19.6 mL/min (A) (by C-G formula based on SCr of 2.38 mg/dL (H)).    Allergies  Allergen Reactions   Iodine Other (See Comments)    REACTION: in eye gtts Iodine eye drops; pt has no reactions to CT contrast    Antimicrobials this admission:  8/23 CTX >> 8/24 Flagyl >> 8/24 Vancomycin >>  Dose adjustments this admission:    Microbiology results:  8/24 UCx: sent 8/24 C.diff: neg 8/24 GI panel: 8/23 BCx:  Thank you for allowing pharmacy to be a part of this patient's care.  EPeggyann Juba PharmD, BCPS Pharmacy: [phone]/24/2022 9:40 AM

## 2021-06-26 NOTE — ED Notes (Signed)
Patient is ready for admission

## 2021-06-26 NOTE — H&P (Signed)
History and Physical    Cynthia [name] XBJ:[id number] DOB: 31-Aug-1961 DOA: 06/25/2021  PCP: Denita Lung, MD Patient coming from: Home  Chief Complaint: Abdominal pain, nausea, vomiting  HPI: Cynthia [name] is a 60 y.o. female with medical history significant of Crohn's disease status post sigmoidectomy with ostomy in April 2022, depression, anxiety, hypertension presenting with complaints of abdominal pain, nausea, and vomiting.  Patient is 3-day history of abdominal pain around her stoma and states it is infected.  Also reports intractable nausea and vomiting.  She has not been able to tolerate any p.o. intake.  Reports a lot of liquid stool output from her ostomy.  Denies recent antibiotic use.  Reports subjective fevers and shortness of breath.  Denies chest pain.  She is fully vaccinated against COVID.  ED Course: Afebrile.  Hypotensive with systolic as low as 30Q.  Labs showing WBC 16.3.  Sodium 129.  Bicarb 12, anion gap 14.  BUN 84, creatinine 3.0 (baseline 0.8).  LFTs normal.  Lipase 149.  Blood culture x2 pending.  Initial lactic acid 1.4, repeat pending.  High-sensitivity troponin mildly elevated but stable (23> 22).  COVID and influenza PCR pending.  Chest x-ray showing no active cardiopulmonary disease.  CT abdomen pelvis negative for acute finding.  Right upper quadrant ultrasound showing cholelithiasis without evidence of acute cholecystitis.  Patient was given fentanyl, Zofran, ceftriaxone, Flagyl, and 3.5 L fluid boluses.  Blood pressure improved.  Review of Systems:  All systems reviewed and apart from history of presenting illness, are negative.  Past Medical History:  Diagnosis Date   Anemia 2012   Anxiety    Bronchitis    Crohn's ileitis ([place])    Depression    GERD (gastroesophageal reflux disease)    Headache(784.0)    MIGRAINE   Heart murmur    Hiatal hernia    Hypertension    IBS (irritable bowel syndrome)    Incontinence    MVP (mitral valve prolapse)     RLS (restless legs syndrome)    Tubular adenoma of colon 08/2019    Past Surgical History:  Procedure Laterality Date   ABDOMINAL HYSTERECTOMY  06/17/2011   Procedure: HYSTERECTOMY ABDOMINAL;  Surgeon: Arloa Koh;  Location: [facility];  Service: Gynecology;  Laterality: N/A;  Converted to Abdominal Hysterectomy with lysis of adhesions    ANTERIOR AND POSTERIOR REPAIR  02/25/2012   Procedure: ANTERIOR (CYSTOCELE) AND POSTERIOR REPAIR (RECTOCELE);  Surgeon: Daria Pastures, MD;  Location: [facility];  Service: Gynecology;  Laterality: N/A;  ANterior cystocele repair ONLY   BIOPSY  08/29/2019   Procedure: BIOPSY;  Surgeon: Lavena Bullion, DO;  Location: [facility];  Service: Gastroenterology;;   BLADDER SUSPENSION  02/25/2012   Procedure: TRANSVAGINAL TAPE (TVT) PROCEDURE;  Surgeon: Daria Pastures, MD;  Location: [facility];  Service: Gynecology;  Laterality: N/A;   COLONOSCOPY WITH PROPOFOL N/A 08/29/2019   Procedure: COLONOSCOPY WITH PROPOFOL;  Surgeon: Lavena Bullion, DO;  Location: [facility];  Service: Gastroenterology;  Laterality: N/A;   CYSTOSCOPY  02/25/2012   Procedure: CYSTOSCOPY;  Surgeon: Daria Pastures, MD;  Location: [facility];  Service: Gynecology;  Laterality: N/A;   ILEOSTOMY     KNEE ARTHROSCOPY  2010   LAPAROSCOPIC ASSISTED VAGINAL HYSTERECTOMY  06/17/2011   Procedure: LAPAROSCOPIC ASSISTED VAGINAL HYSTERECTOMY;  Surgeon: Arloa Koh;  Location: [facility];  Service: Gynecology;  Laterality: N/A;  Attempted    POLYPECTOMY  08/29/2019   Procedure: POLYPECTOMY;  Surgeon: Bryan Lemma,  Dominic Pea, DO;  Location: [facility];  Service: Gastroenterology;;   SALPINGOOPHORECTOMY  06/17/2011   Procedure: SALPINGO OOPHERECTOMY;  Surgeon: Arloa Koh;  Location: [facility];  Service: Gynecology;  Laterality: Bilateral;   TONSILLECTOMY       reports that she has been smoking cigarettes. She has never used smokeless tobacco. She reports that she does not currently use alcohol. She  reports that she does not currently use drugs after having used the following drugs: Cocaine.  Allergies  Allergen Reactions   Iodine Other (See Comments)    REACTION: in eye gtts Iodine eye drops; pt has no reactions to CT contrast    Family History  Problem Relation Age of Onset   Diabetes Mother    Dementia Mother    Clotting disorder Father    Macular degeneration Father    Crohn's disease Sister    Colon cancer Neg Hx    Esophageal cancer Neg Hx    Pancreatic cancer Neg Hx    Stomach cancer Neg Hx    Liver disease Neg Hx     Prior to Admission medications   Medication Sig Start Date End Date Taking? Authorizing Provider  acetaminophen (TYLENOL) 500 MG tablet Take 2 tablets (1,000 mg total) by mouth every 6 (six) hours as needed for moderate pain. Patient not taking: Reported on 06/19/2021 04/09/21   Dwyane Dee, MD  cholestyramine Lucrezia Starch) 4 g packet Take 1 packet by mouth 2 (two) times daily for 14 days. Patient not taking: Reported on 06/19/2021 04/09/21 04/23/21  Dwyane Dee, MD  diphenhydramine-acetaminophen (TYLENOL PM) 25-500 MG TABS tablet Take 2 tablets by mouth at bedtime as needed (sleep/pain). Patient not taking: Reported on 06/19/2021    [provider]  esomeprazole (NEXIUM) 40 MG capsule TAKE ONE (1) CAPSULE BY MOUTH 2 TIMES DAILY Patient taking differently: Take 40 mg by mouth 2 (two) times daily before a meal. 03/05/21   Denita Lung, MD  loperamide (IMODIUM) 2 MG capsule Take 2 mg by mouth daily as needed for diarrhea or loose stools. Patient not taking: Reported on 06/19/2021 03/23/21   [provider]  losartan-hydrochlorothiazide (HYZAAR) 100-12.5 MG tablet Take 1 tablet by mouth daily. Patient not taking: Reported on 06/19/2021 08/03/20   Denita Lung, MD  polycarbophil (FIBERCON) 625 MG tablet Take 1 tablet (625 mg total) by mouth 2 (two) times daily. Patient not taking: Reported on 06/19/2021 01/13/21   Rai, Vernelle Emerald, MD   pramipexole (MIRAPEX) 1 MG tablet Take 1 tablet (1 mg total) by mouth daily. 03/05/21   Denita Lung, MD  venlafaxine XR (EFFEXOR-XR) 150 MG 24 hr capsule Take 2 capsules (300 mg total) by mouth daily with breakfast. 03/05/21   Denita Lung, MD    Physical Exam: Vitals:   06/25/21 2315 06/25/21 2330 06/26/21 0017 06/26/21 0020  BP: 100/67 91/65 95/75    Pulse: 92 92    Resp: 18 17  (!) 27  Temp:      TempSrc:      SpO2: 100% 100%    Weight:      Height:        Physical Exam Constitutional:      Appearance: She is ill-appearing.  HENT:     Head: Normocephalic and atraumatic.  Eyes:     Extraocular Movements: Extraocular movements intact.     Conjunctiva/sclera: Conjunctivae normal.  Cardiovascular:     Rate and Rhythm: Regular rhythm. Tachycardia present.     Pulses: Normal pulses.  Pulmonary:     Effort: Pulmonary effort is normal. No respiratory distress.     Breath sounds: Normal breath sounds. No wheezing or rales.  Abdominal:     General: Bowel sounds are normal.     Palpations: Abdomen is soft.     Tenderness: There is no abdominal tenderness. There is no guarding or rebound.  Musculoskeletal:        General: No swelling or tenderness.     Cervical back: Normal range of motion and neck supple.  Skin:    General: Skin is warm and dry.     Comments: Extensive area of erythema around ostomy  Neurological:     General: No focal deficit present.     Mental Status: She is alert and oriented to person, place, and time.     Labs on Admission: I have personally reviewed following labs and imaging studies  CBC: Recent Labs  Lab 06/25/21 2028  WBC 16.3*  HGB 15.3*  HCT 45.7  MCV 87.9  PLT 976   Basic Metabolic Panel: Recent Labs  Lab 06/25/21 2125  NA 121*  K 4.5  CL 95*  CO2 12*  GLUCOSE 95  BUN 84*  CREATININE 3.06*  CALCIUM 9.8   GFR: Estimated Creatinine Clearance: 15.2 mL/min (A) (by C-G formula based on SCr of 3.06 mg/dL (H)). Liver Function  Tests: Recent Labs  Lab 06/25/21 2125  AST 20  ALT 15  ALKPHOS 63  BILITOT 0.6  PROT 7.9  ALBUMIN 4.2   Recent Labs  Lab 06/25/21 2125  LIPASE 149*   No results for input(s): AMMONIA in the last 168 hours. Coagulation Profile: No results for input(s): INR, PROTIME in the last 168 hours. Cardiac Enzymes: No results for input(s): CKTOTAL, CKMB, CKMBINDEX, TROPONINI in the last 168 hours. BNP (last 3 results) No results for input(s): PROBNP in the last 8760 hours. HbA1C: No results for input(s): HGBA1C in the last 72 hours. CBG: No results for input(s): GLUCAP in the last 168 hours. Lipid Profile: No results for input(s): CHOL, HDL, LDLCALC, TRIG, CHOLHDL, LDLDIRECT in the last 72 hours. Thyroid Function Tests: No results for input(s): TSH, T4TOTAL, FREET4, T3FREE, THYROIDAB in the last 72 hours. Anemia Panel: No results for input(s): VITAMINB12, FOLATE, FERRITIN, TIBC, IRON, RETICCTPCT in the last 72 hours. Urine analysis:    Component Value Date/Time   COLORURINE YELLOW 04/05/2021 2200   APPEARANCEUR CLEAR 04/05/2021 2200   LABSPEC 1.018 04/05/2021 2200   LABSPEC 1,025 12/18/2020 1644   PHURINE 5.0 04/05/2021 2200   GLUCOSEU NEGATIVE 04/05/2021 2200   HGBUR NEGATIVE 04/05/2021 2200   BILIRUBINUR NEGATIVE 04/05/2021 2200   BILIRUBINUR small (A) 12/18/2020 1644   BILIRUBINUR n 12/04/2011 1014   KETONESUR NEGATIVE 04/05/2021 2200   PROTEINUR NEGATIVE 04/05/2021 2200   UROBILINOGEN negative 12/04/2011 1014   NITRITE NEGATIVE 04/05/2021 2200   LEUKOCYTESUR NEGATIVE 04/05/2021 2200    Radiological Exams on Admission: CT Abdomen Pelvis Wo Contrast  Result Date: 06/25/2021 CLINICAL DATA:  Diffuse abdominal pain. Vomiting. Small-bowel obstruction in April of 2022 with ileocectomy and ileostomy. EXAM: CT ABDOMEN AND PELVIS WITHOUT CONTRAST TECHNIQUE: Multidetector CT imaging of the abdomen and pelvis was performed following the standard protocol without IV contrast.  COMPARISON:  CT 04/05/2021 FINDINGS: Lower chest: No acute airspace disease or pleural effusion. The heart is normal in size. Hepatobiliary: No focal hepatic abnormality. Faint intraluminal density in the gallbladder may represent stones or sludge, similar to prior exam. There is no pericholecystic inflammation. No  biliary dilatation. Pancreas: No ductal dilatation or inflammation. Spleen: Normal in size without focal abnormality. Adrenals/Urinary Tract: No adrenal nodule. Slight left adrenal thickening. Slight prominence of the renal pelvises without frank hydronephrosis. No perinephric edema. No urolithiasis. Decompressed ureters. Unremarkable urinary bladder. Stomach/Bowel: Stomach is partially distended. No obvious gastric inflammation. There is no small bowel obstruction. Occasional fluid-filled loops of small bowel in the pelvis without obvious wall thickening or inflammatory change. Right upper quadrant ostomy. There is also a left upper quadrant transverse colostomy. The in situ distal colon is redundant. Enteric sutures are noted in the sigmoid. No evidence of bowel obstruction or acute bowel inflammation. Vascular/Lymphatic: Normal caliber abdominal aorta. There is no portal venous or mesenteric gas. No bulky abdominopelvic adenopathy. Reproductive: Hysterectomy.  No adnexal mass. Other: No free air or ascites.  No abdominal wall hernia. Musculoskeletal: No acute osseous findings. Stable sclerotic focus in the right acetabulum, likely bone island. Stable degenerative change in the spine. IMPRESSION: 1. Occasional fluid-filled loops of small bowel in the pelvis without evidence of obstruction or inflammation. 2. Right upper quadrant ileostomy.  Left upper quadrant colostomy. 3. Faint intraluminal density in the gallbladder may represent stones or sludge, similar to prior exam. No pericholecystic inflammation. Electronically Signed   By: Keith Rake M.D.   On: 06/25/2021 23:29   DG Chest 2  View  Result Date: 06/25/2021 CLINICAL DATA:  Abdominal pain and vomiting. EXAM: CHEST - 2 VIEW COMPARISON:  Chest x-ray dated April 05, 2021. FINDINGS: The heart size and mediastinal contours are within normal limits. Both lungs are clear. The visualized skeletal structures are unremarkable. IMPRESSION: No active cardiopulmonary disease. Electronically Signed   By: Titus Dubin M.D.   On: 06/25/2021 21:16    EKG: Independently reviewed.  Sinus tachycardia, P wave amplitude more pronounced compared to prior tracing.  No other significant change.  Assessment/Plan Principal Problem:   Nausea and vomiting Active Problems:   AKI (acute kidney injury) ([place])   Hyponatremia   Abdominal pain   Sepsis ([place])   Sepsis secondary to gastroenteritis and abdominal wall cellulitis -Patient is presenting with complaints of abdominal pain around her ostomy site, intractable nausea and vomiting.  Reporting a lot of liquid stool output from ostomy.   -Has extensive erythema around ostomy site concerning for abdominal wall cellulitis. -Patient was hypotensive initially with systolic as low as 16X and labs showing leukocytosis.  Initial lactic acid normal, repeat pending. -Lipase 149 but LFTs normal -CT abdomen pelvis negative for acute finding -Right upper quadrant ultrasound showing cholelithiasis without evidence of acute cholecystitis. -COVID and influenza PCR pending but suspicion low as patient is fully vaccinated -Blood pressure now improved after 3.5 L fluid boluses but continues to be tachycardic, continue IV fluid hydration -Continue ceftriaxone and Flagyl -Antiemetic as needed -Pain management -Check procalcitonin level -Blood culture x2 pending -UA and urine culture -Trend WBC count -C. difficile PCR and GI pathogen panel, enteric precautions  AKI -Likely prerenal from dehydration/hypotension -Baseline creatinine 0.8 and now up to 3.0, BUN elevated -CT without evidence of obstructive  uropathy -Continue IV fluid hydration -Monitor BMP -Avoid nephrotoxic agents -Hold home losartan and hydrochlorothiazide  Hyponatremia -IV fluid hydration -Check serum osmolarity -Repeat BMP  Normal anion gap metabolic acidosis -Lactate normal -IV fluid hydration -Repeat BMP  Mild troponin elevation -Likely secondary to demand ischemia from sepsis -Troponin mildly elevated but stable -EKG without acute ischemic changes -Patient is not endorsing any chest pain  Crohn's disease status post sigmoidectomy with ostomy  in April 2022 -CT without evidence of colitis  Depression Anxiety -Continue Effexor  Hypertension -Avoid antihypertensives at this time given hypotension/sepsis  GERD -Continue PPI  DVT prophylaxis: Subcutaneous heparin Code Status: Full code Family Communication: No family available at this time Disposition Plan: Status is: Observation  The patient remains OBS appropriate and will d/c before 2 midnights.  Dispo: The patient is from: Home              Anticipated d/c is to: Home              Patient currently is not medically stable to d/c.   Difficult to place patient No  Level of care: Level of care: Telemetry  The medical decision making on this patient was of high complexity and the patient is at high risk for clinical deterioration, therefore this is a level 3 visit.  Shela Leff MD [facility]  If 7PM-7AM, please contact night-coverage [email or web address]  06/26/2021, 12:49 AM

## 2021-06-26 NOTE — ED Notes (Signed)
Colostomy emptied- approx 200 mls of liquid brown stool

## 2021-06-26 NOTE — Consult Note (Addendum)
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  She states she is independent with pouch application and emptying prior to admission.  She had CT scan contrast and current pouch is leaking related to high volume  of output.  Stoma is red and viable, slightly above skin level, 1 1/4 inches.  Surrounding skin with red erythemia but no partial thickness skin loss has occurred yet Applied stoma powder and crusted with skin prep wipes, then applied barrier ring to attempt to maintain a seal using one piece flexible convex pouch with a belt.   Pt is currently in the ED and supplies will be ordered when she is on the nursing floor.  She is familiar with the pouching routine but did not have any supplies with her.  Use Supplies: Dahlia Byes # [phone], convex Roselee Culver # [id number], powder [name] # [phone], belt [name] # [id number]  Please re-consult if further assistance is needed.  Thank-you,  [name] MSN, [place], [place], [place], [place]

## 2021-06-27 DIAGNOSIS — A419 Sepsis, unspecified organism: Principal | ICD-10-CM

## 2021-06-27 DIAGNOSIS — N179 Acute kidney failure, unspecified: Secondary | ICD-10-CM

## 2021-06-27 LAB — COMPREHENSIVE METABOLIC PANEL
ALT: 10 U/L (ref 0–44)
AST: 21 U/L (ref 15–41)
Albumin: 2.7 g/dL — ABNORMAL LOW (ref 3.5–5.0)
Alkaline Phosphatase: 39 U/L (ref 38–126)
Anion gap: 3 — ABNORMAL LOW (ref 5–15)
BUN: 41 mg/dL — ABNORMAL HIGH (ref 8–23)
CO2: 18 mmol/L — ABNORMAL LOW (ref 22–32)
Calcium: 8.2 mg/dL — ABNORMAL LOW (ref 8.9–10.3)
Chloride: 106 mmol/L (ref 98–111)
Creatinine, Ser: 1.71 mg/dL — ABNORMAL HIGH (ref 0.44–1.00)
GFR, Estimated: 34 mL/min — ABNORMAL LOW (ref >60)
Glucose, Bld: 70 mg/dL (ref 70–99)
Potassium: 4.4 mmol/L (ref 3.5–5.1)
Sodium: 127 mmol/L — ABNORMAL LOW (ref 135–145)
Total Bilirubin: 0.4 mg/dL (ref 0.3–1.2)
Total Protein: 5.2 g/dL — ABNORMAL LOW (ref 6.5–8.1)

## 2021-06-27 LAB — CBC WITH DIFFERENTIAL/PLATELET
Abs Immature Granulocytes: 0.02 10*3/uL (ref 0.00–0.07)
Basophils Absolute: 0 10*3/uL (ref 0.0–0.1)
Basophils Relative: 0 %
Eosinophils Absolute: 0.1 10*3/uL (ref 0.0–0.5)
Eosinophils Relative: 2 %
HCT: 30.8 % — ABNORMAL LOW (ref 36.0–46.0)
Hemoglobin: 10.6 g/dL — ABNORMAL LOW (ref 12.0–15.0)
Immature Granulocytes: 0 %
Lymphocytes Relative: 38 %
Lymphs Abs: 2.4 10*3/uL (ref 0.7–4.0)
MCH: 29.6 pg (ref 26.0–34.0)
MCHC: 34.4 g/dL (ref 30.0–36.0)
MCV: 86 fL (ref 80.0–100.0)
Monocytes Absolute: 0.3 10*3/uL (ref 0.1–1.0)
Monocytes Relative: 5 %
Neutro Abs: 3.5 10*3/uL (ref 1.7–7.7)
Neutrophils Relative %: 55 %
Platelets: UNDETERMINED 10*3/uL (ref 150–400)
RBC: 3.58 MIL/uL — ABNORMAL LOW (ref 3.87–5.11)
RDW: 12.6 % (ref 11.5–15.5)
WBC: 6.3 10*3/uL (ref 4.0–10.5)
nRBC: 0 % (ref 0.0–0.2)

## 2021-06-27 LAB — URINE CULTURE

## 2021-06-27 LAB — MAGNESIUM: Magnesium: 1.5 mg/dL — ABNORMAL LOW (ref 1.7–2.4)

## 2021-06-27 MED ORDER — VANCOMYCIN HCL 500 MG/100ML IV SOLN
500.0000 mg | INTRAVENOUS | Status: DC
  Filled 2021-06-27: qty 100

## 2021-06-27 MED ORDER — MELATONIN 5 MG PO TABS
5.0000 mg | ORAL_TABLET | Freq: Every day | ORAL | Status: DC
  Administered 2021-06-27: 5 mg via ORAL
  Filled 2021-06-27: qty 1

## 2021-06-27 MED ORDER — PRAMIPEXOLE DIHYDROCHLORIDE 1 MG PO TABS
1.0000 mg | ORAL_TABLET | Freq: Once | ORAL | Status: AC
  Administered 2021-06-27: 1 mg via ORAL
  Filled 2021-06-27: qty 1

## 2021-06-27 MED ORDER — MELATONIN 3 MG PO TABS
3.0000 mg | ORAL_TABLET | Freq: Once | ORAL | Status: AC
  Administered 2021-06-27: 3 mg via ORAL
  Filled 2021-06-27: qty 1

## 2021-06-27 MED ORDER — SODIUM CHLORIDE 0.9 % IV SOLN: INTRAVENOUS | Status: DC

## 2021-06-27 MED ORDER — MAGNESIUM SULFATE 2 GM/50ML IV SOLN
2.0000 g | Freq: Once | INTRAVENOUS | Status: AC
  Administered 2021-06-27: 2 g via INTRAVENOUS
  Filled 2021-06-27: qty 50

## 2021-06-27 MED ORDER — CEFAZOLIN SODIUM-DEXTROSE 2-4 GM/100ML-% IV SOLN
2.0000 g | Freq: Two times a day (BID) | INTRAVENOUS | Status: DC
  Administered 2021-06-27 – 2021-06-28 (×2): 2 g via INTRAVENOUS
  Filled 2021-06-27 (×2): qty 100

## 2021-06-27 NOTE — Progress Notes (Signed)
Pharmacy Antibiotic Note  Cynthia [name] is a 60 y.o. female admitted on 06/25/2021 with abdominal wall cellulitis.  Pharmacy has been consulted for cefazolin  dosing. Note AKI with baseline SCr < 1.0     Plan: Cefazolin 2 gr IV q12h    Height: 5' 4"  (162.6 cm) Weight: 53.1 kg (117 lb 1 oz) IBW/kg (Calculated) : 54.7  Temp (24hrs), Avg:97.4 F (36.3 C), Min:97.2 F (36.2 C), Max:97.6 F (36.4 C)  Recent Labs  Lab 06/25/21 2028 06/25/21 2125 06/25/21 2151 06/26/21 0422 06/27/21 0518  WBC 16.3*  --   --  13.1* 6.3  CREATININE  --  3.06*  --  2.38* 1.71*  LATICACIDVEN  --   --  1.4  --   --      Estimated Creatinine Clearance: 29 mL/min (A) (by C-G formula based on SCr of 1.71 mg/dL (H)).    Allergies  Allergen Reactions   Iodine Other (See Comments)    REACTION: in eye gtts Iodine eye drops; pt has no reactions to CT contrast    Antimicrobials this admission:  8/23 CTX >> 8/24 8/24 Flagyl >> 8/24 8/24 Vancomycin >> 8/24 8/25 cefazolin >>   Dose adjustments this admission:    Microbiology results:  8/24 UCx: sent 8/24 C.diff: neg 8/24 GI panel: 8/23 BCx:  Thank you for allowing pharmacy to be a part of this patient's care.   Royetta Asal, PharmD, BCPS 06/27/2021 11:52 AM

## 2021-06-27 NOTE — Progress Notes (Signed)
Patient ID: Cynthia [name], female   DOB: [birth year], 60 y.o.   MRN: [id number]  PROGRESS NOTE    Cynthia [name]  YOV:[id number] DOB: 04-18-1961 DOA: 06/25/2021 PCP: Denita Lung, MD   Brief Narrative:  60 y.o. female with medical history significant of Crohn's disease status post sigmoidectomy with ostomy in April 2022, depression, anxiety, hypertension presented with abdominal pain, nausea and vomiting with decreased oral intake.  On presentation, she was hypotensive with systolic blood pressure as low as 70s.  WBCs of 16.3, sodium of 129, bicarb of 12 with creatinine of 3 (baseline 0.8).  Chest x-ray showed no active cardiopulmonary disease.  CT of the abdomen and pelvis was negative for acute findings.  Right upper quadrant ultrasound showed cholelithiasis without cholecystitis.  She was started on IV antibiotics and fluids.  Assessment & Plan:   Sepsis: Present admission Leukocytosis -Patient presented with hypotension, leukocytosis with possible gastroenteritis and abdominal wall cellulitis -Initial blood pressure was as low as 70s.  Blood pressure improving but still on the lower side. -Blood cultures negative so far.  Urine culture grew multiple species.  COVID-19 and influenza were negative on admission.  Chest x-ray was negative for infiltrates. -Antibiotic plan as below.  Change IV fluids to normal saline at 100 cc an hour. -Leukocytosis has resolved  Possible acute gastroenteritis -Questionable cause.  Stool for C. difficile and GI pathogen negative.  DC enteric precautions. -Continue antiemetics as needed.  Symptoms improving.  Advance diet to soft diet today.  Acute kidney injury Dehydration Non-anion gap metabolic acidosis -Creatinine 3.06 on presentation.  Improving to 1.71.  IV fluids as above.  Losartan and hydrochlorothiazide on hold.  CT of the abdomen without obstructive uropathy features. -Acidosis improving.  Hyponatremia -From poor oral intake.   Improving.  IV fluids as above.  Monitor  Possible abdominal wall cellulitis -Possibly from leakage around ostomy.  Currently on Rocephin, Flagyl and vancomycin.  Switch antibiotics to Ancef.  Mild troponin elevation -Possibly secondary to demand ischemia from sepsis.  Troponins mildly elevated but stable.  EKG without any ischemic changes.  No chest pain.  No further work-up needed  Crohn's disease status post sigmoidectomy with ostomy in April thousand 22 -Ostomy care as per wound care consult recommendations.  CT without evidence of colitis.  Patient needs close follow-up with GI as an outpatient.  Previously used to follow with Dr. Fuller Plan.  Expresses interest in following up with him again.  I encouraged her to call his office and set up an appointment.  Depression/anxiety -Continue Effexor  Hypertension -Antihypertensives on hold because of hypotension  GERD -Continue PPI   DVT prophylaxis: Subcutaneous heparin Code Status: Full Family Communication: None at bedside Disposition Plan: Status is: Inpatient  Remains inpatient appropriate because:Inpatient level of care appropriate due to severity of illness  Dispo: The patient is from: Home              Anticipated d/c is to: Home              Patient currently is not medically stable to d/c.   Difficult to place patient No   Consultants: None  Procedures: None  Antimicrobials:  Anti-infectives (From admission, onward)    Start     Dose/Rate Route Frequency Ordered Stop   06/28/21 1000  vancomycin (VANCOREADY) IVPB 750 mg/150 mL  Status:  Discontinued        750 mg 150 mL/hr over 60 Minutes Intravenous Every 48 hours 06/26/21 0944 06/27/21 0948  06/27/21 1200  vancomycin (VANCOREADY) IVPB 500 mg/100 mL        500 mg 100 mL/hr over 60 Minutes Intravenous Every 24 hours 06/27/21 0949     06/26/21 2300  cefTRIAXone (ROCEPHIN) 2 g in sodium chloride 0.9 % 100 mL IVPB        2 g 200 mL/hr over 30 Minutes Intravenous  Every 24 hours 06/26/21 0047     06/26/21 1200  metroNIDAZOLE (FLAGYL) IVPB 500 mg        500 mg 100 mL/hr over 60 Minutes Intravenous Every 12 hours 06/26/21 0047     06/26/21 1000  vancomycin (VANCOCIN) IVPB 1000 mg/200 mL premix        1,000 mg 200 mL/hr over 60 Minutes Intravenous  Once 06/26/21 0936 06/26/21 1149   06/25/21 2315  cefTRIAXone (ROCEPHIN) 2 g in sodium chloride 0.9 % 100 mL IVPB        2 g 200 mL/hr over 30 Minutes Intravenous  Once 06/25/21 2306 06/26/21 0053   06/25/21 2315  metroNIDAZOLE (FLAGYL) IVPB 500 mg        500 mg 100 mL/hr over 60 Minutes Intravenous  Once 06/25/21 2306 06/26/21 0145        Subjective: Patient seen and examined at bedside.  She feels slightly better.  Currently not nauseous and wants to advance her diet.  No overnight fever or vomiting reported.  Objective: Vitals:   06/26/21 1537 06/26/21 2030 06/27/21 0031 06/27/21 0445  BP: 94/67 (!) 81/57 94/67 98/69   Pulse: 68 67 69 68  Resp: 18 16 14 16   Temp:  (!) 97.3 F (36.3 C) (!) 97.2 F (36.2 C) (!) 97.5 F (36.4 C)  TempSrc:  Oral Oral Oral  SpO2: 100% 100% 100% 100%  Weight:    53.1 kg  Height:        Intake/Output Summary (Last 24 hours) at 06/27/2021 1107 Last data filed at 06/27/2021 0446 Gross per 24 hour  Intake 2484.43 ml  Output --  Net 2484.43 ml   Filed Weights   06/25/21 2005 06/27/21 0445  Weight: 49.9 kg 53.1 kg    Examination:  General exam: Appears calm and comfortable.  Currently on room air. Respiratory system: Bilateral decreased breath sounds at bases Cardiovascular system: S1 & S2 heard, Rate controlled Gastrointestinal system: Abdomen is nondistended, soft and mildly tender around the ostomy site.  Normal bowel sounds heard. Extremities: No cyanosis, clubbing, edema  Central nervous system: Alert and oriented. No focal neurological deficits. Moving extremities Skin: Erythema around ostomy site present.  No other obvious ecchymosis Psychiatry:  Judgement and insight appear normal. Mood & affect appropriate.     Data Reviewed: I have personally reviewed following labs and imaging studies  CBC: Recent Labs  Lab 06/25/21 2028 06/26/21 0422 06/27/21 0518  WBC 16.3* 13.1* 6.3  NEUTROABS  --   --  3.5  HGB 15.3* 12.0 10.6*  HCT 45.7 36.3 30.8*  MCV 87.9 89.2 86.0  PLT 312 232 PLATELET CLUMPS NOTED ON SMEAR, UNABLE TO ESTIMATE   Basic Metabolic Panel: Recent Labs  Lab 06/25/21 2125 06/26/21 0422 06/27/21 0518  NA 121* 126* 127*  K 4.5 3.6 4.4  CL 95* 103 106  CO2 12* 15* 18*  GLUCOSE 95 101* 70  BUN 84* 69* 41*  CREATININE 3.06* 2.38* 1.71*  CALCIUM 9.8 8.2* 8.2*  MG  --   --  1.5*   GFR: Estimated Creatinine Clearance: 29 mL/min (A) (by C-G formula based on  SCr of 1.71 mg/dL (H)). Liver Function Tests: Recent Labs  Lab 06/25/21 2125 06/27/21 0518  AST 20 21  ALT 15 10  ALKPHOS 63 39  BILITOT 0.6 0.4  PROT 7.9 5.2*  ALBUMIN 4.2 2.7*   Recent Labs  Lab 06/25/21 2125  LIPASE 149*   No results for input(s): AMMONIA in the last 168 hours. Coagulation Profile: No results for input(s): INR, PROTIME in the last 168 hours. Cardiac Enzymes: No results for input(s): CKTOTAL, CKMB, CKMBINDEX, TROPONINI in the last 168 hours. BNP (last 3 results) No results for input(s): PROBNP in the last 8760 hours. HbA1C: No results for input(s): HGBA1C in the last 72 hours. CBG: No results for input(s): GLUCAP in the last 168 hours. Lipid Profile: No results for input(s): CHOL, HDL, LDLCALC, TRIG, CHOLHDL, LDLDIRECT in the last 72 hours. Thyroid Function Tests: No results for input(s): TSH, T4TOTAL, FREET4, T3FREE, THYROIDAB in the last 72 hours. Anemia Panel: No results for input(s): VITAMINB12, FOLATE, FERRITIN, TIBC, IRON, RETICCTPCT in the last 72 hours. Sepsis Labs: Recent Labs  Lab 06/25/21 2151 06/26/21 0422  PROCALCITON  --  <0.10  LATICACIDVEN 1.4  --     Recent Results (from the past 240 hour(s))   Novel Coronavirus, NAA ([facility])     Status: None   Collection Time: 06/19/21 11:30 AM   Specimen: Nasopharyngeal(NP) swabs in vial transport medium  Result Value Ref Range Status   SARS-CoV-2, NAA Not Detected Not Detected Final    Comment: This nucleic acid amplification test was developed and its performance characteristics determined by [employer]. Nucleic acid amplification tests include RT-PCR and TMA. This test has not been FDA cleared or approved. This test has been authorized by FDA under an Emergency Use Authorization (EUA). This test is only authorized for the duration of time the declaration that circumstances exist justifying the authorization of the emergency use of in vitro diagnostic tests for detection of SARS-CoV-2 virus and/or diagnosis of COVID-19 infection under section 564(b)(1) of the Act, 21 U.S.C. 616WVP-7(T) (1), unless the authorization is terminated or revoked sooner. When diagnostic testing is negative, the possibility of a false negative result should be considered in the context of a patient's recent exposures and the presence of clinical signs and symptoms consistent with COVID-19. An individual without symptoms of COVID-19 and who is not shedding SARS-CoV-2 virus wo uld expect to have a negative (not detected) result in this assay.   SARS-COV-2, NAA 2 DAY TAT     Status: None   Collection Time: 06/19/21 11:30 AM  Result Value Ref Range Status   SARS-CoV-2, NAA 2 DAY TAT Performed  Final  Culture, blood (routine x 2)     Status: None (Preliminary result)   Collection Time: 06/25/21  9:51 PM   Specimen: BLOOD  Result Value Ref Range Status   Specimen Description   Final    BLOOD SPECIMEN SOURCE NOT MARKED ON REQUISITION Performed at [facility] [address], [place], [place] [address]    Special Requests   Final    BOTTLES DRAWN AEROBIC AND ANAEROBIC Blood Culture adequate volume Performed at [facility] [address] [place], [place] [address]    Culture   Final    NO GROWTH 1 DAY Performed at [facility], [place] [address], [place], [place] [address]    Report Status PENDING  Incomplete  Resp Panel by RT-PCR (Flu A&B, Covid) Nasopharyngeal Swab     Status: None  Collection Time: 06/25/21 11:51 PM   Specimen: Nasopharyngeal Swab; Nasopharyngeal(NP) swabs in vial transport medium  Result Value Ref Range Status   SARS Coronavirus 2 by RT PCR NEGATIVE NEGATIVE Final    Comment: (NOTE) SARS-CoV-2 target nucleic acids are NOT DETECTED.  The SARS-CoV-2 RNA is generally detectable in upper respiratory specimens during the acute phase of infection. The lowest concentration of SARS-CoV-2 viral copies this assay can detect is 138 copies/mL. A negative result does not preclude SARS-Cov-2 infection and should not be used as the sole basis for treatment or other patient management decisions. A negative result may occur with  improper specimen collection/handling, submission of specimen other than nasopharyngeal swab, presence of viral mutation(s) within the areas targeted by this assay, and inadequate number of viral copies(<138 copies/mL). A negative result must be combined with clinical observations, patient history, and epidemiological information. The expected result is Negative.  Fact Sheet for Patients:  EntrepreneurPulse.com.au  Fact Sheet for Healthcare Providers:  IncredibleEmployment.be  This test is no t yet approved or cleared by the [place] FDA and  has been authorized for detection and/or diagnosis of SARS-CoV-2 by FDA under an Emergency Use Authorization (EUA). This EUA will remain  in effect (meaning this test can be used) for the duration of the COVID-19 declaration under Section 564(b)(1) of the Act, 21 U.S.C.section 360bbb-3(b)(1), unless the authorization is terminated  or revoked sooner.       Influenza A  by PCR NEGATIVE NEGATIVE Final   Influenza B by PCR NEGATIVE NEGATIVE Final    Comment: (NOTE) The Xpert Xpress SARS-CoV-2/FLU/RSV plus assay is intended as an aid in the diagnosis of influenza from Nasopharyngeal swab specimens and should not be used as a sole basis for treatment. Nasal washings and aspirates are unacceptable for Xpert Xpress SARS-CoV-2/FLU/RSV testing.  Fact Sheet for Patients: EntrepreneurPulse.com.au  Fact Sheet for Healthcare Providers: IncredibleEmployment.be  This test is not yet approved or cleared by the [place] FDA and has been authorized for detection and/or diagnosis of SARS-CoV-2 by FDA under an Emergency Use Authorization (EUA). This EUA will remain in effect (meaning this test can be used) for the duration of the COVID-19 declaration under Section 564(b)(1) of the Act, 21 U.S.C. section 360bbb-3(b)(1), unless the authorization is terminated or revoked.  Performed at [facility], [place] [address], [place], [place] [address]   C Difficile Quick Screen w PCR reflex     Status: None   Collection Time: 06/26/21  1:18 AM   Specimen: Stool  Result Value Ref Range Status   C Diff antigen NEGATIVE NEGATIVE Final   C Diff toxin NEGATIVE NEGATIVE Final   C Diff interpretation No C. difficile detected.  Final    Comment: Performed at [facility], [place] [address] [place], [place] [address]  Gastrointestinal Panel by PCR , Stool     Status: None   Collection Time: 06/26/21  1:18 AM   Specimen: Stool  Result Value Ref Range Status   Campylobacter species NOT DETECTED NOT DETECTED Final   Plesimonas shigelloides NOT DETECTED NOT DETECTED Final   Salmonella species NOT DETECTED NOT DETECTED Final   Yersinia enterocolitica NOT DETECTED NOT DETECTED Final   Vibrio species NOT DETECTED NOT DETECTED Final   Vibrio cholerae NOT DETECTED NOT DETECTED Final   Enteroaggregative E coli  (EAEC) NOT DETECTED NOT DETECTED Final   Enteropathogenic E coli (EPEC) NOT DETECTED NOT DETECTED Final   Enterotoxigenic E coli (ETEC) NOT DETECTED NOT  DETECTED Final   Shiga like toxin producing E coli (STEC) NOT DETECTED NOT DETECTED Final   Shigella/Enteroinvasive E coli (EIEC) NOT DETECTED NOT DETECTED Final   Cryptosporidium NOT DETECTED NOT DETECTED Final   Cyclospora cayetanensis NOT DETECTED NOT DETECTED Final   Entamoeba histolytica NOT DETECTED NOT DETECTED Final   Giardia lamblia NOT DETECTED NOT DETECTED Final   Adenovirus F40/41 NOT DETECTED NOT DETECTED Final   Astrovirus NOT DETECTED NOT DETECTED Final   Norovirus GI/GII NOT DETECTED NOT DETECTED Final   Rotavirus A NOT DETECTED NOT DETECTED Final   Sapovirus (I, II, IV, and V) NOT DETECTED NOT DETECTED Final    Comment: Performed at [facility] [address], [place], [place] [address]  Urine Culture     Status: Abnormal   Collection Time: 06/26/21  2:35 AM   Specimen: Urine, Clean Catch  Result Value Ref Range Status   Specimen Description   Final    URINE, CLEAN CATCH Performed at [facility], [place] [address] [place], [place] [address]    Special Requests   Final    NONE Performed at [facility], [place] [address] [place],  [address]    Culture MULTIPLE SPECIES PRESENT, SUGGEST RECOLLECTION (A)  Final   Report Status 06/27/2021 FINAL  Final         Radiology Studies: CT Abdomen Pelvis Wo Contrast  Result Date: 06/25/2021 CLINICAL DATA:  Diffuse abdominal pain. Vomiting. Small-bowel obstruction in April of 2022 with ileocectomy and ileostomy. EXAM: CT ABDOMEN AND PELVIS WITHOUT CONTRAST TECHNIQUE: Multidetector CT imaging of the abdomen and pelvis was performed following the standard protocol without IV contrast. COMPARISON:  CT 04/05/2021 FINDINGS: Lower chest: No acute airspace disease or pleural effusion. The heart is normal in size. Hepatobiliary: No  focal hepatic abnormality. Faint intraluminal density in the gallbladder may represent stones or sludge, similar to prior exam. There is no pericholecystic inflammation. No biliary dilatation. Pancreas: No ductal dilatation or inflammation. Spleen: Normal in size without focal abnormality. Adrenals/Urinary Tract: No adrenal nodule. Slight left adrenal thickening. Slight prominence of the renal pelvises without frank hydronephrosis. No perinephric edema. No urolithiasis. Decompressed ureters. Unremarkable urinary bladder. Stomach/Bowel: Stomach is partially distended. No obvious gastric inflammation. There is no small bowel obstruction. Occasional fluid-filled loops of small bowel in the pelvis without obvious wall thickening or inflammatory change. Right upper quadrant ostomy. There is also a left upper quadrant transverse colostomy. The in situ distal colon is redundant. Enteric sutures are noted in the sigmoid. No evidence of bowel obstruction or acute bowel inflammation. Vascular/Lymphatic: Normal caliber abdominal aorta. There is no portal venous or mesenteric gas. No bulky abdominopelvic adenopathy. Reproductive: Hysterectomy.  No adnexal mass. Other: No free air or ascites.  No abdominal wall hernia. Musculoskeletal: No acute osseous findings. Stable sclerotic focus in the right acetabulum, likely bone island. Stable degenerative change in the spine. IMPRESSION: 1. Occasional fluid-filled loops of small bowel in the pelvis without evidence of obstruction or inflammation. 2. Right upper quadrant ileostomy.  Left upper quadrant colostomy. 3. Faint intraluminal density in the gallbladder may represent stones or sludge, similar to prior exam. No pericholecystic inflammation. Electronically Signed   By: Keith Rake M.D.   On: 06/25/2021 23:29   DG Chest 2 View  Result Date: 06/25/2021 CLINICAL DATA:  Abdominal pain and vomiting. EXAM: CHEST - 2 VIEW COMPARISON:  Chest x-ray dated April 05, 2021. FINDINGS:  The heart size and mediastinal contours are within normal  limits. Both lungs are clear. The visualized skeletal structures are unremarkable. IMPRESSION: No active cardiopulmonary disease. Electronically Signed   By: Titus Dubin M.D.   On: 06/25/2021 21:16   US Abdomen Limited RUQ (LIVER/GB)  Result Date: 06/26/2021 CLINICAL DATA:  Cholelithiasis. History of hypertension and gastroesophageal reflux disease. EXAM: ULTRASOUND ABDOMEN LIMITED RIGHT UPPER QUADRANT COMPARISON:  CT 06/25/2021 FINDINGS: Gallbladder: Cholelithiasis with stones in the dependent gallbladder, largest measuring 1.5 cm. No gallbladder wall thickening or edema. Murphy's sign is negative. Common bile duct: Diameter: 3 mm, normal Liver: Somewhat limited visualization due to surface bandages but visualized liver appears homogeneous with normal echotexture and without focal lesion. Portal vein is patent on color Doppler imaging with normal direction of blood flow towards the liver. Other: None. IMPRESSION: Cholelithiasis without evidence of acute cholecystitis. Electronically Signed   By: Lucienne Capers M.D.   On: 06/26/2021 01:09        Scheduled Meds:  heparin  5,000 Units Subcutaneous Q8H   pantoprazole  40 mg Oral BID   venlafaxine XR  300 mg Oral Q breakfast   Continuous Infusions:  sodium chloride 100 mL/hr at 06/27/21 0956   cefTRIAXone (ROCEPHIN)  IV 2 g (06/26/21 2226)   metronidazole 500 mg (06/26/21 2349)   vancomycin            Aline August, MD [facility] 06/27/2021, 11:07 AM

## 2021-06-27 NOTE — Progress Notes (Signed)
Pharmacy Antibiotic Note  Cynthia [name] is a 60 y.o. female admitted on 06/25/2021 with abdominal wall cellulitis.  Pharmacy has been consulted for vancomycin dosing. Note AKI with baseline SCr < 1.0  Today, The Scr has improved from 2.38 to 1.71. Therefore will adjust dose   Plan: Vancomycin 1g IV x 1, then 568m IV q24h for estimated AUC 460 using SCr 1.71, Vd 0.72L/kg (goal AUC 400-550) Consider checking vancomycin levels at steady state CTX/Flagyl per MD Follow up renal function & cultures   Height: 5' 4"  (162.6 cm) Weight: 53.1 kg (117 lb 1 oz) IBW/kg (Calculated) : 54.7  Temp (24hrs), Avg:97.4 F (36.3 C), Min:97.2 F (36.2 C), Max:97.6 F (36.4 C)  Recent Labs  Lab 06/25/21 2028 06/25/21 2125 06/25/21 2151 06/26/21 0422 06/27/21 0518  WBC 16.3*  --   --  13.1* 6.3  CREATININE  --  3.06*  --  2.38* 1.71*  LATICACIDVEN  --   --  1.4  --   --      Estimated Creatinine Clearance: 29 mL/min (A) (by C-G formula based on SCr of 1.71 mg/dL (H)).    Allergies  Allergen Reactions   Iodine Other (See Comments)    REACTION: in eye gtts Iodine eye drops; pt has no reactions to CT contrast    Antimicrobials this admission:  8/23 CTX >> 8/24 Flagyl >> 8/24 Vancomycin >>  Dose adjustments this admission:    Microbiology results:  8/24 UCx: sent 8/24 C.diff: neg 8/24 GI panel: 8/23 BCx:  Thank you for allowing pharmacy to be a part of this patient's care.   NRoyetta Asal PharmD, BCPS 06/27/2021 9:48 AM

## 2021-06-28 DIAGNOSIS — E871 Hypo-osmolality and hyponatremia: Secondary | ICD-10-CM

## 2021-06-28 LAB — CBC WITH DIFFERENTIAL/PLATELET
Abs Immature Granulocytes: 0.04 10*3/uL (ref 0.00–0.07)
Basophils Absolute: 0 10*3/uL (ref 0.0–0.1)
Basophils Relative: 0 %
Eosinophils Absolute: 0.2 10*3/uL (ref 0.0–0.5)
Eosinophils Relative: 2 %
HCT: 31.3 % — ABNORMAL LOW (ref 36.0–46.0)
Hemoglobin: 10.2 g/dL — ABNORMAL LOW (ref 12.0–15.0)
Immature Granulocytes: 1 %
Lymphocytes Relative: 24 %
Lymphs Abs: 2 10*3/uL (ref 0.7–4.0)
MCH: 29.6 pg (ref 26.0–34.0)
MCHC: 32.6 g/dL (ref 30.0–36.0)
MCV: 90.7 fL (ref 80.0–100.0)
Monocytes Absolute: 0.5 10*3/uL (ref 0.1–1.0)
Monocytes Relative: 6 %
Neutro Abs: 5.7 10*3/uL (ref 1.7–7.7)
Neutrophils Relative %: 67 %
Platelets: 214 10*3/uL (ref 150–400)
RBC: 3.45 MIL/uL — ABNORMAL LOW (ref 3.87–5.11)
RDW: 12.9 % (ref 11.5–15.5)
WBC: 8.4 10*3/uL (ref 4.0–10.5)
nRBC: 0 % (ref 0.0–0.2)

## 2021-06-28 LAB — BASIC METABOLIC PANEL
Anion gap: 5 (ref 5–15)
BUN: 28 mg/dL — ABNORMAL HIGH (ref 8–23)
CO2: 15 mmol/L — ABNORMAL LOW (ref 22–32)
Calcium: 8.4 mg/dL — ABNORMAL LOW (ref 8.9–10.3)
Chloride: 111 mmol/L (ref 98–111)
Creatinine, Ser: 1.49 mg/dL — ABNORMAL HIGH (ref 0.44–1.00)
GFR, Estimated: 40 mL/min — ABNORMAL LOW (ref >60)
Glucose, Bld: 79 mg/dL (ref 70–99)
Potassium: 3.7 mmol/L (ref 3.5–5.1)
Sodium: 131 mmol/L — ABNORMAL LOW (ref 135–145)

## 2021-06-28 LAB — MAGNESIUM: Magnesium: 1.9 mg/dL (ref 1.7–2.4)

## 2021-06-28 MED ORDER — LOPERAMIDE HCL 2 MG PO CAPS: 2.0000 mg | ORAL_CAPSULE | Freq: Every day | ORAL | 0 refills | Status: DC | PRN

## 2021-06-28 MED ORDER — CEPHALEXIN 500 MG PO CAPS: 500.0000 mg | ORAL_CAPSULE | Freq: Three times a day (TID) | ORAL | 0 refills | Status: AC

## 2021-06-28 NOTE — Evaluation (Signed)
Physical Therapy Evaluation Patient Details Name: Cynthia [name] MRN: [id number] DOB: [birth year] Today's Date: 06/28/2021   History of Present Illness  pt is a 60 y.o. female with medical history significant of Crohn's disease status post sigmoidectomy with ostomy in April 2022, depression, anxiety, hypertension presented with abdominal pain, nausea and vomiting with decreased oral intake. found to have abdominal wall cellullitis and right upper quadrant ultrasound showed cholelithiasis without cholecystitis  Clinical Impression  Pt is a 60 y.o. female with above HPI. Pt presents below baseline with decreased activity tolerance, balance and ambulation deficits requiring up to intermittent MIN A for stability/balance without use of assistive device. Pt will benefit from continued skilled PT for higher level balance training while in the acute setting, anticipate good progress and no PT follow-up needed upon d/c.     Follow Up Recommendations Supervision - Intermittent;No PT follow up    Equipment Recommendations  None recommended by PT    Recommendations for Other Services       Precautions / Restrictions Precautions Precautions: Fall Restrictions Weight Bearing Restrictions: No      Mobility  Bed Mobility Overal bed mobility: Independent             General bed mobility comments: HOB slightly elevated    Transfers Overall transfer level: Needs assistance Equipment used: None Transfers: Sit to/from Stand Sit to Stand: Supervision         General transfer comment: x2 from EOB and toilet. Supervision provided for safety. Pt demononstrated good line management with IV pole when walking to bathroom, Independent with management of colostomy bag.  Ambulation/Gait Ambulation/Gait assistance: Min assist;Min guard;Supervision Gait Distance (Feet): 250 Feet Assistive device: IV Pole;None Gait Pattern/deviations: Step-through pattern;Decreased stride length;Drifts  right/left;Narrow base of support Gait velocity: decr   General Gait Details: Pt with use of IV pole for ~159f with supervision for safety and normal gait speed. PT trialed pt without use of UE support on IV pole and pt required MIN guard-MIN A for stability/balance. Pt's gait speed decreased, intermittently narrow BOS, and drifting L/R without single UE support.  Stairs            Wheelchair Mobility    Modified Rankin (Stroke Patients Only)       Balance Overall balance assessment: Needs assistance Sitting-balance support: Feet supported Sitting balance-Leahy Scale: Normal     Standing balance support: Single extremity supported;No upper extremity supported Standing balance-Leahy Scale: Fair                               Pertinent Vitals/Pain Pain Assessment: Faces Faces Pain Scale: Hurts little more Pain Location: headache Pain Descriptors / Indicators: Aching Pain Intervention(s): Limited activity within patient's tolerance;Monitored during session;Premedicated before session    Home Living Family/patient expects to be discharged to:: Private residence Living Arrangements: Other relatives (roomate and roommates 3 children) Available Help at Discharge:  (roommate) Type of Home: House Home Access: Stairs to enter Entrance Stairs-Rails: Right Entrance Stairs-Number of Steps: 6 Home Layout: One level Home Equipment: WEnvironmental consultant- 2 wheels;Walker - 4 wheels (has access to R[employer] Johnsonand rollator)      Prior Function Level of Independence: Independent         Comments: Pt reports independence with all ADLS and IADLS, likes to do yard work, drives, does her own shopping.     Hand Dominance   Dominant Hand: Right    Extremity/Trunk Assessment   Upper  Extremity Assessment Upper Extremity Assessment: Overall WFL for tasks assessed    Lower Extremity Assessment Lower Extremity Assessment: Generalized weakness    Cervical / Trunk Assessment Cervical /  Trunk Assessment: Normal  Communication   Communication: No difficulties  Cognition Arousal/Alertness: Awake/alert Behavior During Therapy: WFL for tasks assessed/performed Overall Cognitive Status: Within Functional Limits for tasks assessed                                        General Comments General comments (skin integrity, edema, etc.): 5x sit to stand: 30.33s, pt with use of B UEs for power up    Exercises     Assessment/Plan    PT Assessment Patient needs continued PT services  PT Problem List Decreased strength;Decreased activity tolerance;Decreased balance;Decreased mobility       PT Treatment Interventions Gait training;Stair training;Functional mobility training;Therapeutic activities;Therapeutic exercise;Balance training;Patient/family education    PT Goals (Current goals can be found in the Care Plan section)  Acute Rehab PT Goals Patient Stated Goal: get more energy and be able to move more PT Goal Formulation: With patient Time For Goal Achievement: 07/12/21 Potential to Achieve Goals: Good    Frequency Min 3X/week   Barriers to discharge        Co-evaluation               AM-PAC PT "6 Clicks" Mobility  Outcome Measure Help needed turning from your back to your side while in a flat bed without using bedrails?: None Help needed moving from lying on your back to sitting on the side of a flat bed without using bedrails?: None Help needed moving to and from a bed to a chair (including a wheelchair)?: A Little Help needed standing up from a chair using your arms (e.g., wheelchair or bedside chair)?: None Help needed to walk in hospital room?: A Little Help needed climbing 3-5 steps with a railing? : A Lot 6 Click Score: 20    End of Session Equipment Utilized During Treatment: Gait belt Activity Tolerance: Patient tolerated treatment well Patient left: in bed;with call bell/phone within reach Nurse Communication: Mobility  status PT Visit Diagnosis: Unsteadiness on feet (R26.81);Muscle weakness (generalized) (M62.81)    Time: 1610-9604 PT Time Calculation (min) (ACUTE ONLY): 20 min   Charges:   PT Evaluation $PT Eval Low Complexity: 1 Low          Festus Barren PT, DPT  Acute Rehabilitation Services  Office [phone]   06/28/2021, 9:42 AM

## 2021-06-28 NOTE — TOC Transition Note (Signed)
Transition of Care [facility]) - CM/SW Discharge Note   Patient Details  Name: Cynthia [name] MRN: [id number] Date of Birth: 1961-04-26  [facility] CM/SW Contact:  Leeroy Cha, RN Phone Number: 06/28/2021, 12:40 PM   Clinical Narrative:    Discharged to home no toc needs present.   Final next level of care: Home/Self Care Barriers to Discharge: Barriers Resolved   Patient Goals and CMS Choice        Discharge Placement                       Discharge Plan and Services                                     Social Determinants of Health (SDOH) Interventions     Readmission Risk Interventions Readmission Risk Prevention Plan 04/09/2021  Transportation Screening Complete  Medication Review ([place]) Complete  PCP or Specialist appointment within 3-5 days of discharge Complete  HRI or [place] Complete  SW Recovery Care/Counseling Consult Complete  [place] Not Applicable  Some recent data might be hidden

## 2021-06-28 NOTE — Discharge Summary (Signed)
Physician Discharge Summary  Cynthia [name] YYQ:[id number] DOB: 25-Mar-1961 DOA: 06/25/2021  PCP: Denita Lung, MD  Admit date: 06/25/2021 Discharge date: 06/28/2021  Admitted From: Home Disposition: Home  Recommendations for Outpatient Follow-up:  Follow up with PCP in 1 week with repeat CBC/BMP Outpatient follow-up with GI at earliest convenience Comply with medications and follow-up. Follow up in ED if symptoms worsen or new appear   Home Health: No Equipment/Devices: None  Discharge Condition: Stable CODE STATUS: Full Diet recommendation: Regular diet  Brief/Interim Summary: 60 y.o. female with medical history significant of Crohn's disease status post sigmoidectomy with ostomy in April 2022, depression, anxiety, hypertension presented with abdominal pain, nausea and vomiting with decreased oral intake.  On presentation, she was hypotensive with systolic blood pressure as low as 70s.  WBCs of 16.3, sodium of 129, bicarb of 12 with creatinine of 3 (baseline 0.8).  Chest x-ray showed no active cardiopulmonary disease.  CT of the abdomen and pelvis was negative for acute findings.  Right upper quadrant ultrasound showed cholelithiasis without cholecystitis.  She was started on IV antibiotics and fluids.  During the hospitalization, her condition has improved.  Her diet was gradually advanced and she has tolerated the diet.  Kidney function and sodium levels are improving.  She feels better enough to go home today.  She will be discharged home today with outpatient follow-up with PCP.  She will need to follow-up with GI as an outpatient at earliest convenience.  Discharge Diagnoses:   Sepsis: Present admission: Resolved Leukocytosis: Resolved -Patient presented with hypotension, leukocytosis with possible gastroenteritis and abdominal wall cellulitis -Initial blood pressure was as low as 70s.  Blood pressure improving but still on the lower side.  We will continue to hold  antihypertensives on discharge. -Blood cultures negative so far.  Urine culture grew multiple species.  COVID-19 and influenza were negative on admission.  Chest x-ray was negative for infiltrates. -Antibiotic plan as below.    Possible acute gastroenteritis -Questionable cause.  Stool for C. difficile and GI pathogen negative.   -Symptoms improving.  Tolerating soft diet. -Discharge patient home today.    Acute kidney injury Dehydration Non-anion gap metabolic acidosis -Creatinine 3.06 on presentation.  Improving to 1.49 today.  I treated with IV fluids.  Losartan and hydrochlorothiazide on hold.  CT of the abdomen without obstructive uropathy features. -Acidosis improving.   Hyponatremia -From poor oral intake.  Improving.  Treated with IV fluids.  Outpatient follow-up.  Possible abdominal wall cellulitis -Possibly from leakage around ostomy.  Currently on Ancef.  Improving.  Discharged home on 5 days of oral Keflex.    Mild troponin elevation -Possibly secondary to demand ischemia from sepsis.  Troponins mildly elevated but stable.  EKG without any ischemic changes.  No chest pain.  No further work-up needed  Crohn's disease status post sigmoidectomy with ostomy in April thousand 22 -Ostomy care as per wound care consult recommendations.  CT without evidence of colitis.  Patient needs close follow-up with GI as an outpatient.  Previously used to follow with Dr. Fuller Plan.  Expresses interest in following up with him again.  I encouraged her to call his office and set up an appointment.   Depression/anxiety -Continue Effexor  Hypertension -Antihypertensives on hold because of hypotension   GERD -Continue PPI  Discharge Instructions  Discharge Instructions     Ambulatory referral to Gastroenterology   Complete by: As directed    Diet - low sodium heart healthy   Complete by: As  directed    Increase activity slowly   Complete by: As directed       Allergies as of  06/28/2021       Reactions   Iodine Other (See Comments)   REACTION: in eye gtts Iodine eye drops; pt has no reactions to CT contrast        Medication List     STOP taking these medications    acetaminophen 500 MG tablet Commonly known as: TYLENOL   cholestyramine 4 g packet Commonly known as: Questran   diphenhydramine-acetaminophen 25-500 MG Tabs tablet Commonly known as: TYLENOL PM   losartan-hydrochlorothiazide 100-12.5 MG tablet Commonly known as: HYZAAR       TAKE these medications    cephALEXin 500 MG capsule Commonly known as: KEFLEX Take 1 capsule (500 mg total) by mouth 3 (three) times daily for 5 days.   esomeprazole 40 MG capsule Commonly known as: NEXIUM TAKE ONE (1) CAPSULE BY MOUTH 2 TIMES DAILY What changed:  how much to take how to take this when to take this additional instructions   loperamide 2 MG capsule Commonly known as: IMODIUM Take 1 capsule (2 mg total) by mouth daily as needed for diarrhea or loose stools.   polycarbophil 625 MG tablet Commonly known as: FIBERCON Take 1 tablet (625 mg total) by mouth 2 (two) times daily.   pramipexole 1 MG tablet Commonly known as: MIRAPEX Take 1 tablet (1 mg total) by mouth daily.   venlafaxine XR 150 MG 24 hr capsule Commonly known as: EFFEXOR-XR Take 2 capsules (300 mg total) by mouth daily with breakfast.        Follow-up Information     Denita Lung, MD. Schedule an appointment as soon as possible for a visit in 1 week(s).   Specialty: Family Medicine Why: with repeat cbc/bmp Contact information: [place] [address] [phone]         [name], MD. Schedule an appointment as soon as possible for a visit in 1 week(s).   Specialty: Gastroenterology Contact information: [address] [place] [address] [phone]                Allergies  Allergen Reactions   Iodine Other (See Comments)    REACTION: in eye  gtts Iodine eye drops; pt has no reactions to CT contrast    Consultations: None   Procedures/Studies: CT Abdomen Pelvis Wo Contrast  Result Date: 06/25/2021 CLINICAL DATA:  Diffuse abdominal pain. Vomiting. Small-bowel obstruction in April of 2022 with ileocectomy and ileostomy. EXAM: CT ABDOMEN AND PELVIS WITHOUT CONTRAST TECHNIQUE: Multidetector CT imaging of the abdomen and pelvis was performed following the standard protocol without IV contrast. COMPARISON:  CT 04/05/2021 FINDINGS: Lower chest: No acute airspace disease or pleural effusion. The heart is normal in size. Hepatobiliary: No focal hepatic abnormality. Faint intraluminal density in the gallbladder may represent stones or sludge, similar to prior exam. There is no pericholecystic inflammation. No biliary dilatation. Pancreas: No ductal dilatation or inflammation. Spleen: Normal in size without focal abnormality. Adrenals/Urinary Tract: No adrenal nodule. Slight left adrenal thickening. Slight prominence of the renal pelvises without frank hydronephrosis. No perinephric edema. No urolithiasis. Decompressed ureters. Unremarkable urinary bladder. Stomach/Bowel: Stomach is partially distended. No obvious gastric inflammation. There is no small bowel obstruction. Occasional fluid-filled loops of small bowel in the pelvis without obvious wall thickening or inflammatory change. Right upper quadrant ostomy. There is also a left upper quadrant transverse colostomy. The in situ  distal colon is redundant. Enteric sutures are noted in the sigmoid. No evidence of bowel obstruction or acute bowel inflammation. Vascular/Lymphatic: Normal caliber abdominal aorta. There is no portal venous or mesenteric gas. No bulky abdominopelvic adenopathy. Reproductive: Hysterectomy.  No adnexal mass. Other: No free air or ascites.  No abdominal wall hernia. Musculoskeletal: No acute osseous findings. Stable sclerotic focus in the right acetabulum, likely bone island.  Stable degenerative change in the spine. IMPRESSION: 1. Occasional fluid-filled loops of small bowel in the pelvis without evidence of obstruction or inflammation. 2. Right upper quadrant ileostomy.  Left upper quadrant colostomy. 3. Faint intraluminal density in the gallbladder may represent stones or sludge, similar to prior exam. No pericholecystic inflammation. Electronically Signed   By: Keith Rake M.D.   On: 06/25/2021 23:29   DG Chest 2 View  Result Date: 06/25/2021 CLINICAL DATA:  Abdominal pain and vomiting. EXAM: CHEST - 2 VIEW COMPARISON:  Chest x-ray dated April 05, 2021. FINDINGS: The heart size and mediastinal contours are within normal limits. Both lungs are clear. The visualized skeletal structures are unremarkable. IMPRESSION: No active cardiopulmonary disease. Electronically Signed   By: Titus Dubin M.D.   On: 06/25/2021 21:16   US Abdomen Limited RUQ (LIVER/GB)  Result Date: 06/26/2021 CLINICAL DATA:  Cholelithiasis. History of hypertension and gastroesophageal reflux disease. EXAM: ULTRASOUND ABDOMEN LIMITED RIGHT UPPER QUADRANT COMPARISON:  CT 06/25/2021 FINDINGS: Gallbladder: Cholelithiasis with stones in the dependent gallbladder, largest measuring 1.5 cm. No gallbladder wall thickening or edema. Murphy's sign is negative. Common bile duct: Diameter: 3 mm, normal Liver: Somewhat limited visualization due to surface bandages but visualized liver appears homogeneous with normal echotexture and without focal lesion. Portal vein is patent on color Doppler imaging with normal direction of blood flow towards the liver. Other: None. IMPRESSION: Cholelithiasis without evidence of acute cholecystitis. Electronically Signed   By: Lucienne Capers M.D.   On: 06/26/2021 01:09      Subjective: Patient seen and examined at bedside.  She feels better and thinks that she wants to go home today.  Still feels weak.  No overnight fever, worsening abdominal pain reported.  Discharge  Exam: Vitals:   06/28/21 0057 06/28/21 0450  BP: (!) 88/67 90/64  Pulse: 79 76  Resp: 16 16  Temp: (!) 97.5 F (36.4 C) 97.6 F (36.4 C)  SpO2: 100%     General: Pt is alert, awake, not in acute distress.  On room air.  Looks chronically ill and deconditioned. Cardiovascular: rate controlled, S1/S2 + Respiratory: bilateral decreased breath sounds at bases Abdominal: Soft, NT, ND, bowel sounds +; ostomy bag with surrounding slight erythema present Extremities: no edema, no cyanosis    The results of significant diagnostics from this hospitalization (including imaging, microbiology, ancillary and laboratory) are listed below for reference.     Microbiology: Recent Results (from the past 240 hour(s))  Novel Coronavirus, NAA ([facility])     Status: None   Collection Time: 06/19/21 11:30 AM   Specimen: Nasopharyngeal(NP) swabs in vial transport medium  Result Value Ref Range Status   SARS-CoV-2, NAA Not Detected Not Detected Final    Comment: This nucleic acid amplification test was developed and its performance characteristics determined by [employer] and [employer]. Nucleic acid amplification tests include RT-PCR and TMA. This test has not been FDA cleared or approved. This test has been authorized by FDA under an Emergency Use Authorization (EUA). This test is only authorized for the duration of time the declaration that circumstances exist justifying the  authorization of the emergency use of in vitro diagnostic tests for detection of SARS-CoV-2 virus and/or diagnosis of COVID-19 infection under section 564(b)(1) of the Act, 21 U.S.C. 262MBT-5(H) (1), unless the authorization is terminated or revoked sooner. When diagnostic testing is negative, the possibility of a false negative result should be considered in the context of a patient's recent exposures and the presence of clinical signs and symptoms consistent with COVID-19. An individual without symptoms of COVID-19 and who is  not shedding SARS-CoV-2 virus wo uld expect to have a negative (not detected) result in this assay.   SARS-COV-2, NAA 2 DAY TAT     Status: None   Collection Time: 06/19/21 11:30 AM  Result Value Ref Range Status   SARS-CoV-2, NAA 2 DAY TAT Performed  Final  Culture, blood (routine x 2)     Status: None (Preliminary result)   Collection Time: 06/25/21  9:51 PM   Specimen: BLOOD  Result Value Ref Range Status   Specimen Description   Final    BLOOD SPECIMEN SOURCE NOT MARKED ON REQUISITION Performed at [facility] [address] [place], [place] [address]    Special Requests   Final    BOTTLES DRAWN AEROBIC AND ANAEROBIC Blood Culture adequate volume Performed at [facility] [address], [place], [place] [address]    Culture   Final    NO GROWTH 2 DAYS Performed at [facility] [address], [place],  [address]    Report Status PENDING  Incomplete  Resp Panel by RT-PCR (Flu A&B, Covid) Nasopharyngeal Swab     Status: None   Collection Time: 06/25/21 11:51 PM   Specimen: Nasopharyngeal Swab; Nasopharyngeal(NP) swabs in vial transport medium  Result Value Ref Range Status   SARS Coronavirus 2 by RT PCR NEGATIVE NEGATIVE Final    Comment: (NOTE) SARS-CoV-2 target nucleic acids are NOT DETECTED.  The SARS-CoV-2 RNA is generally detectable in upper respiratory specimens during the acute phase of infection. The lowest concentration of SARS-CoV-2 viral copies this assay can detect is 138 copies/mL. A negative result does not preclude SARS-Cov-2 infection and should not be used as the sole basis for treatment or other patient management decisions. A negative result may occur with  improper specimen collection/handling, submission of specimen other than nasopharyngeal swab, presence of viral mutation(s) within the areas targeted by this assay, and inadequate number of viral copies(<138 copies/mL). A negative result must be  combined with clinical observations, patient history, and epidemiological information. The expected result is Negative.  Fact Sheet for Patients:  EntrepreneurPulse.com.au  Fact Sheet for Healthcare Providers:  IncredibleEmployment.be  This test is no t yet approved or cleared by the [place] FDA and  has been authorized for detection and/or diagnosis of SARS-CoV-2 by FDA under an Emergency Use Authorization (EUA). This EUA will remain  in effect (meaning this test can be used) for the duration of the COVID-19 declaration under Section 564(b)(1) of the Act, 21 U.S.C.section 360bbb-3(b)(1), unless the authorization is terminated  or revoked sooner.       Influenza A by PCR NEGATIVE NEGATIVE Final   Influenza B by PCR NEGATIVE NEGATIVE Final    Comment: (NOTE) The Xpert Xpress SARS-CoV-2/FLU/RSV plus assay is intended as an aid in the diagnosis of influenza from Nasopharyngeal swab specimens and should not be used as a sole basis for treatment. Nasal washings and aspirates are unacceptable for Xpert Xpress SARS-CoV-2/FLU/RSV testing.  Fact Sheet for Patients: EntrepreneurPulse.com.au  Fact Sheet for Healthcare Providers: IncredibleEmployment.be  This test is not yet approved or cleared by the [place] FDA and has been authorized for detection and/or diagnosis of SARS-CoV-2 by FDA under an Emergency Use Authorization (EUA). This EUA will remain in effect (meaning this test can be used) for the duration of the COVID-19 declaration under Section 564(b)(1) of the Act, 21 U.S.C. section 360bbb-3(b)(1), unless the authorization is terminated or revoked.  Performed at [facility], [place] [address] [place], [place] [address]   C Difficile Quick Screen w PCR reflex     Status: None   Collection Time: 06/26/21  1:18 AM   Specimen: Stool  Result Value Ref Range Status   C Diff  antigen NEGATIVE NEGATIVE Final   C Diff toxin NEGATIVE NEGATIVE Final   C Diff interpretation No C. difficile detected.  Final    Comment: Performed at [facility], [place] [address] [place], [place] [address]  Gastrointestinal Panel by PCR , Stool     Status: None   Collection Time: 06/26/21  1:18 AM   Specimen: Stool  Result Value Ref Range Status   Campylobacter species NOT DETECTED NOT DETECTED Final   Plesimonas shigelloides NOT DETECTED NOT DETECTED Final   Salmonella species NOT DETECTED NOT DETECTED Final   Yersinia enterocolitica NOT DETECTED NOT DETECTED Final   Vibrio species NOT DETECTED NOT DETECTED Final   Vibrio cholerae NOT DETECTED NOT DETECTED Final   Enteroaggregative E coli (EAEC) NOT DETECTED NOT DETECTED Final   Enteropathogenic E coli (EPEC) NOT DETECTED NOT DETECTED Final   Enterotoxigenic E coli (ETEC) NOT DETECTED NOT DETECTED Final   Shiga like toxin producing E coli (STEC) NOT DETECTED NOT DETECTED Final   Shigella/Enteroinvasive E coli (EIEC) NOT DETECTED NOT DETECTED Final   Cryptosporidium NOT DETECTED NOT DETECTED Final   Cyclospora cayetanensis NOT DETECTED NOT DETECTED Final   Entamoeba histolytica NOT DETECTED NOT DETECTED Final   Giardia lamblia NOT DETECTED NOT DETECTED Final   Adenovirus F40/41 NOT DETECTED NOT DETECTED Final   Astrovirus NOT DETECTED NOT DETECTED Final   Norovirus GI/GII NOT DETECTED NOT DETECTED Final   Rotavirus A NOT DETECTED NOT DETECTED Final   Sapovirus (I, II, IV, and V) NOT DETECTED NOT DETECTED Final    Comment: Performed at [facility], [address], [place], [place] [address]  Urine Culture     Status: Abnormal   Collection Time: 06/26/21  2:35 AM   Specimen: Urine, Clean Catch  Result Value Ref Range Status   Specimen Description   Final    URINE, CLEAN CATCH Performed at [facility], [place] [address], [place], [name] [address]    Special Requests   Final     NONE Performed at [facility], [place] [address], [place], [place] [address]    Culture MULTIPLE SPECIES PRESENT, SUGGEST RECOLLECTION (A)  Final   Report Status 06/27/2021 FINAL  Final     Labs: BNP (last 3 results) No results for input(s): BNP in the last 8760 hours. Basic Metabolic Panel: Recent Labs  Lab 06/25/21 2125 06/26/21 0422 06/27/21 0518 06/28/21 0541  NA 121* 126* 127* 131*  K 4.5 3.6 4.4 3.7  CL 95* 103 106 111  CO2 12* 15* 18* 15*  GLUCOSE 95 101* 70 79  BUN 84* 69* 41* 28*  CREATININE 3.06* 2.38* 1.71* 1.49*  CALCIUM 9.8 8.2* 8.2* 8.4*  MG  --   --  1.5*  1.9   Liver Function Tests: Recent Labs  Lab 06/25/21 2125 06/27/21 0518  AST 20 21  ALT 15 10  ALKPHOS 63 39  BILITOT 0.6 0.4  PROT 7.9 5.2*  ALBUMIN 4.2 2.7*   Recent Labs  Lab 06/25/21 2125  LIPASE 149*   No results for input(s): AMMONIA in the last 168 hours. CBC: Recent Labs  Lab 06/25/21 2028 06/26/21 0422 06/27/21 0518 06/28/21 0541  WBC 16.3* 13.1* 6.3 8.4  NEUTROABS  --   --  3.5 5.7  HGB 15.3* 12.0 10.6* 10.2*  HCT 45.7 36.3 30.8* 31.3*  MCV 87.9 89.2 86.0 90.7  PLT 312 232 PLATELET CLUMPS NOTED ON SMEAR, UNABLE TO ESTIMATE 214   Cardiac Enzymes: No results for input(s): CKTOTAL, CKMB, CKMBINDEX, TROPONINI in the last 168 hours. BNP: Invalid input(s): POCBNP CBG: No results for input(s): GLUCAP in the last 168 hours. D-Dimer No results for input(s): DDIMER in the last 72 hours. Hgb A1c No results for input(s): HGBA1C in the last 72 hours. Lipid Profile No results for input(s): CHOL, HDL, LDLCALC, TRIG, CHOLHDL, LDLDIRECT in the last 72 hours. Thyroid function studies No results for input(s): TSH, T4TOTAL, T3FREE, THYROIDAB in the last 72 hours.  Invalid input(s): FREET3 Anemia work up No results for input(s): VITAMINB12, FOLATE, FERRITIN, TIBC, IRON, RETICCTPCT in the last 72 hours. Urinalysis    Component Value Date/Time   COLORURINE YELLOW  06/26/2021 0235   APPEARANCEUR HAZY (A) 06/26/2021 0235   LABSPEC 1.013 06/26/2021 0235   LABSPEC 1,025 12/18/2020 1644   PHURINE 5.0 06/26/2021 0235   GLUCOSEU NEGATIVE 06/26/2021 0235   HGBUR NEGATIVE 06/26/2021 0235   BILIRUBINUR NEGATIVE 06/26/2021 0235   BILIRUBINUR small (A) 12/18/2020 1644   BILIRUBINUR n 12/04/2011 [place] 06/26/2021 0235   PROTEINUR NEGATIVE 06/26/2021 0235   UROBILINOGEN negative 12/04/2011 1014   NITRITE NEGATIVE 06/26/2021 0235   LEUKOCYTESUR SMALL (A) 06/26/2021 0235   Sepsis Labs Invalid input(s): PROCALCITONIN,  WBC,  LACTICIDVEN Microbiology Recent Results (from the past 240 hour(s))  Novel Coronavirus, NAA ([facility])     Status: None   Collection Time: 06/19/21 11:30 AM   Specimen: Nasopharyngeal(NP) swabs in vial transport medium  Result Value Ref Range Status   SARS-CoV-2, NAA Not Detected Not Detected Final    Comment: This nucleic acid amplification test was developed and its performance characteristics determined by [employer], [employer] and [employer]. Nucleic acid amplification tests include RT-PCR and TMA. This test has not been FDA cleared or approved. This test has been authorized by FDA under an Emergency Use Authorization (EUA). This test is only authorized for the duration of time the declaration that circumstances exist justifying the authorization of the emergency use of in vitro diagnostic tests for detection of SARS-CoV-2 virus and/or diagnosis of COVID-19 infection under section 564(b)(1) of the Act, 21 U.S.C. 128NOM-7(E) (1), unless the authorization is terminated or revoked sooner. When diagnostic testing is negative, the possibility of a false negative result should be considered in the context of a patient's recent exposures and the presence of clinical signs and symptoms consistent with COVID-19. An individual without symptoms of COVID-19 and who is not shedding SARS-CoV-2 virus wo uld expect to have a negative (not  detected) result in this assay.   SARS-COV-2, NAA 2 DAY TAT     Status: None   Collection Time: 06/19/21 11:30 AM  Result Value Ref Range Status   SARS-CoV-2, NAA 2 DAY TAT Performed  Final  Culture, blood (routine x 2)  Status: None (Preliminary result)   Collection Time: 06/25/21  9:51 PM   Specimen: BLOOD  Result Value Ref Range Status   Specimen Description   Final    BLOOD SPECIMEN SOURCE NOT MARKED ON REQUISITION Performed at [facility] [address] [place], [place] [address]    Special Requests   Final    BOTTLES DRAWN AEROBIC AND ANAEROBIC Blood Culture adequate volume Performed at [facility] [address] [place], [place] [address]    Culture   Final    NO GROWTH 2 DAYS Performed at [facility] [address] [place], [place] [address]    Report Status PENDING  Incomplete  Resp Panel by RT-PCR (Flu A&B, Covid) Nasopharyngeal Swab     Status: None   Collection Time: 06/25/21 11:51 PM   Specimen: Nasopharyngeal Swab; Nasopharyngeal(NP) swabs in vial transport medium  Result Value Ref Range Status   SARS Coronavirus 2 by RT PCR NEGATIVE NEGATIVE Final    Comment: (NOTE) SARS-CoV-2 target nucleic acids are NOT DETECTED.  The SARS-CoV-2 RNA is generally detectable in upper respiratory specimens during the acute phase of infection. The lowest concentration of SARS-CoV-2 viral copies this assay can detect is 138 copies/mL. A negative result does not preclude SARS-Cov-2 infection and should not be used as the sole basis for treatment or other patient management decisions. A negative result may occur with  improper specimen collection/handling, submission of specimen other than nasopharyngeal swab, presence of viral mutation(s) within the areas targeted by this assay, and inadequate number of viral copies(<138 copies/mL). A negative result must be combined with clinical observations, patient history, and  epidemiological information. The expected result is Negative.  Fact Sheet for Patients:  EntrepreneurPulse.com.au  Fact Sheet for Healthcare Providers:  IncredibleEmployment.be  This test is no t yet approved or cleared by the [place] FDA and  has been authorized for detection and/or diagnosis of SARS-CoV-2 by FDA under an Emergency Use Authorization (EUA). This EUA will remain  in effect (meaning this test can be used) for the duration of the COVID-19 declaration under Section 564(b)(1) of the Act, 21 U.S.C.section 360bbb-3(b)(1), unless the authorization is terminated  or revoked sooner.       Influenza A by PCR NEGATIVE NEGATIVE Final   Influenza B by PCR NEGATIVE NEGATIVE Final    Comment: (NOTE) The Xpert Xpress SARS-CoV-2/FLU/RSV plus assay is intended as an aid in the diagnosis of influenza from Nasopharyngeal swab specimens and should not be used as a sole basis for treatment. Nasal washings and aspirates are unacceptable for Xpert Xpress SARS-CoV-2/FLU/RSV testing.  Fact Sheet for Patients: EntrepreneurPulse.com.au  Fact Sheet for Healthcare Providers: IncredibleEmployment.be  This test is not yet approved or cleared by the [place] FDA and has been authorized for detection and/or diagnosis of SARS-CoV-2 by FDA under an Emergency Use Authorization (EUA). This EUA will remain in effect (meaning this test can be used) for the duration of the COVID-19 declaration under Section 564(b)(1) of the Act, 21 U.S.C. section 360bbb-3(b)(1), unless the authorization is terminated or revoked.  Performed at [facility], [place] [address], [place], [place] [address]   C Difficile Quick Screen w PCR reflex     Status: None   Collection Time: 06/26/21  1:18 AM   Specimen: Stool  Result Value Ref Range Status   C Diff antigen NEGATIVE NEGATIVE Final   C Diff toxin NEGATIVE  NEGATIVE Final   C Diff interpretation No  C. difficile detected.  Final    Comment: Performed at [facility], [place] [address] [place], [place] [address]  Gastrointestinal Panel by PCR , Stool     Status: None   Collection Time: 06/26/21  1:18 AM   Specimen: Stool  Result Value Ref Range Status   Campylobacter species NOT DETECTED NOT DETECTED Final   Plesimonas shigelloides NOT DETECTED NOT DETECTED Final   Salmonella species NOT DETECTED NOT DETECTED Final   Yersinia enterocolitica NOT DETECTED NOT DETECTED Final   Vibrio species NOT DETECTED NOT DETECTED Final   Vibrio cholerae NOT DETECTED NOT DETECTED Final   Enteroaggregative E coli (EAEC) NOT DETECTED NOT DETECTED Final   Enteropathogenic E coli (EPEC) NOT DETECTED NOT DETECTED Final   Enterotoxigenic E coli (ETEC) NOT DETECTED NOT DETECTED Final   Shiga like toxin producing E coli (STEC) NOT DETECTED NOT DETECTED Final   Shigella/Enteroinvasive E coli (EIEC) NOT DETECTED NOT DETECTED Final   Cryptosporidium NOT DETECTED NOT DETECTED Final   Cyclospora cayetanensis NOT DETECTED NOT DETECTED Final   Entamoeba histolytica NOT DETECTED NOT DETECTED Final   Giardia lamblia NOT DETECTED NOT DETECTED Final   Adenovirus F40/41 NOT DETECTED NOT DETECTED Final   Astrovirus NOT DETECTED NOT DETECTED Final   Norovirus GI/GII NOT DETECTED NOT DETECTED Final   Rotavirus A NOT DETECTED NOT DETECTED Final   Sapovirus (I, II, IV, and V) NOT DETECTED NOT DETECTED Final    Comment: Performed at [facility], [address], [place], [place] [address]  Urine Culture     Status: Abnormal   Collection Time: 06/26/21  2:35 AM   Specimen: Urine, Clean Catch  Result Value Ref Range Status   Specimen Description   Final    URINE, CLEAN CATCH Performed at [facility], [place] [address], [place],  [address]    Special Requests   Final    NONE Performed at [facility], [place]  [address] [place],  [address]    Culture MULTIPLE SPECIES PRESENT, SUGGEST RECOLLECTION (A)  Final   Report Status 06/27/2021 FINAL  Final     Time coordinating discharge: 35 minutes  SIGNED:   Aline August, MD  [facility] 06/28/2021, 12:11 PM

## 2021-07-01 LAB — CULTURE, BLOOD (ROUTINE X 2)
Culture: NO GROWTH
Special Requests: ADEQUATE

## 2021-07-09 ENCOUNTER — Inpatient Hospital Stay (HOSPITAL_COMMUNITY)
Admission: EM | Admit: 2021-07-09 | Discharge: 2021-07-14 | DRG: 682 | Disposition: A | Discharge status: Home / Self Care | Payer: Medicaid Other | Attending: Internal Medicine | Admitting: Internal Medicine

## 2021-07-09 ENCOUNTER — Encounter (HOSPITAL_COMMUNITY): Payer: Self-pay

## 2021-07-09 ENCOUNTER — Other Ambulatory Visit: Payer: Self-pay

## 2021-07-09 ENCOUNTER — Emergency Department (HOSPITAL_COMMUNITY): Payer: Medicaid Other

## 2021-07-09 DIAGNOSIS — I959 Hypotension, unspecified: Secondary | ICD-10-CM | POA: Diagnosis present

## 2021-07-09 DIAGNOSIS — E43 Unspecified severe protein-calorie malnutrition: Secondary | ICD-10-CM | POA: Insufficient documentation

## 2021-07-09 DIAGNOSIS — D84821 Immunodeficiency due to drugs: Secondary | ICD-10-CM

## 2021-07-09 DIAGNOSIS — I341 Nonrheumatic mitral (valve) prolapse: Secondary | ICD-10-CM | POA: Diagnosis present

## 2021-07-09 DIAGNOSIS — F32A Depression, unspecified: Secondary | ICD-10-CM | POA: Diagnosis present

## 2021-07-09 DIAGNOSIS — R64 Cachexia: Secondary | ICD-10-CM | POA: Diagnosis present

## 2021-07-09 DIAGNOSIS — R579 Shock, unspecified: Secondary | ICD-10-CM

## 2021-07-09 DIAGNOSIS — K50013 Crohn's disease of small intestine with fistula: Secondary | ICD-10-CM | POA: Diagnosis present

## 2021-07-09 DIAGNOSIS — N179 Acute kidney failure, unspecified: Principal | ICD-10-CM | POA: Diagnosis present

## 2021-07-09 DIAGNOSIS — K561 Intussusception: Secondary | ICD-10-CM | POA: Diagnosis present

## 2021-07-09 DIAGNOSIS — Z452 Encounter for adjustment and management of vascular access device: Secondary | ICD-10-CM

## 2021-07-09 DIAGNOSIS — K5000 Crohn's disease of small intestine without complications: Secondary | ICD-10-CM | POA: Diagnosis present

## 2021-07-09 DIAGNOSIS — F17210 Nicotine dependence, cigarettes, uncomplicated: Secondary | ICD-10-CM | POA: Diagnosis present

## 2021-07-09 DIAGNOSIS — I10 Essential (primary) hypertension: Secondary | ICD-10-CM | POA: Diagnosis present

## 2021-07-09 DIAGNOSIS — Z8489 Family history of other specified conditions: Secondary | ICD-10-CM

## 2021-07-09 DIAGNOSIS — E872 Acidosis: Secondary | ICD-10-CM | POA: Diagnosis present

## 2021-07-09 DIAGNOSIS — Z79899 Other long term (current) drug therapy: Secondary | ICD-10-CM

## 2021-07-09 DIAGNOSIS — Z888 Allergy status to other drugs, medicaments and biological substances status: Secondary | ICD-10-CM

## 2021-07-09 DIAGNOSIS — R571 Hypovolemic shock: Secondary | ICD-10-CM | POA: Diagnosis present

## 2021-07-09 DIAGNOSIS — R54 Age-related physical debility: Secondary | ICD-10-CM | POA: Diagnosis present

## 2021-07-09 DIAGNOSIS — Z832 Family history of diseases of the blood and blood-forming organs and certain disorders involving the immune mechanism: Secondary | ICD-10-CM

## 2021-07-09 DIAGNOSIS — R000 Tachycardia, unspecified: Secondary | ICD-10-CM | POA: Diagnosis not present

## 2021-07-09 DIAGNOSIS — K219 Gastro-esophageal reflux disease without esophagitis: Secondary | ICD-10-CM | POA: Diagnosis present

## 2021-07-09 DIAGNOSIS — E860 Dehydration: Secondary | ICD-10-CM | POA: Diagnosis present

## 2021-07-09 DIAGNOSIS — E871 Hypo-osmolality and hyponatremia: Secondary | ICD-10-CM | POA: Diagnosis present

## 2021-07-09 DIAGNOSIS — R627 Adult failure to thrive: Secondary | ICD-10-CM | POA: Diagnosis present

## 2021-07-09 DIAGNOSIS — Z681 Body mass index (BMI) 19 or less, adult: Secondary | ICD-10-CM

## 2021-07-09 DIAGNOSIS — E875 Hyperkalemia: Secondary | ICD-10-CM | POA: Diagnosis present

## 2021-07-09 DIAGNOSIS — F1410 Cocaine abuse, uncomplicated: Secondary | ICD-10-CM | POA: Diagnosis present

## 2021-07-09 DIAGNOSIS — F419 Anxiety disorder, unspecified: Secondary | ICD-10-CM | POA: Diagnosis present

## 2021-07-09 DIAGNOSIS — E861 Hypovolemia: Secondary | ICD-10-CM | POA: Diagnosis present

## 2021-07-09 DIAGNOSIS — Z932 Ileostomy status: Secondary | ICD-10-CM

## 2021-07-09 DIAGNOSIS — Z833 Family history of diabetes mellitus: Secondary | ICD-10-CM

## 2021-07-09 DIAGNOSIS — G2581 Restless legs syndrome: Secondary | ICD-10-CM | POA: Diagnosis present

## 2021-07-09 DIAGNOSIS — E8729 Other acidosis: Secondary | ICD-10-CM | POA: Diagnosis present

## 2021-07-09 DIAGNOSIS — Z820 Family history of epilepsy and other diseases of the nervous system: Secondary | ICD-10-CM

## 2021-07-09 DIAGNOSIS — Z20822 Contact with and (suspected) exposure to covid-19: Secondary | ICD-10-CM | POA: Diagnosis present

## 2021-07-09 LAB — COMPREHENSIVE METABOLIC PANEL
ALT: 25 U/L (ref 0–44)
AST: 26 U/L (ref 15–41)
Albumin: 4.4 g/dL (ref 3.5–5.0)
Alkaline Phosphatase: 75 U/L (ref 38–126)
Anion gap: 18 — ABNORMAL HIGH (ref 5–15)
BUN: 89 mg/dL — ABNORMAL HIGH (ref 8–23)
CO2: 14 mmol/L — ABNORMAL LOW (ref 22–32)
Calcium: 9.6 mg/dL (ref 8.9–10.3)
Chloride: 88 mmol/L — ABNORMAL LOW (ref 98–111)
Creatinine, Ser: 5.24 mg/dL — ABNORMAL HIGH (ref 0.44–1.00)
GFR, Estimated: 9 mL/min — ABNORMAL LOW (ref >60)
Glucose, Bld: 85 mg/dL (ref 70–99)
Potassium: >7.5 mmol/L (ref 3.5–5.1)
Sodium: 120 mmol/L — ABNORMAL LOW (ref 135–145)
Total Bilirubin: 0.7 mg/dL (ref 0.3–1.2)
Total Protein: 8.5 g/dL — ABNORMAL HIGH (ref 6.5–8.1)

## 2021-07-09 LAB — LIPASE, BLOOD: Lipase: 116 U/L — ABNORMAL HIGH (ref 11–51)

## 2021-07-09 MED ORDER — SODIUM CHLORIDE 0.9 % IV SOLN: 1000.0000 mL | INTRAVENOUS | Status: DC

## 2021-07-09 MED ORDER — CALCIUM GLUCONATE 10 % IV SOLN
1.0000 g | Freq: Once | INTRAVENOUS | Status: AC
  Administered 2021-07-10: 1 g via INTRAVENOUS
  Filled 2021-07-09: qty 10

## 2021-07-09 MED ORDER — SODIUM BICARBONATE 8.4 % IV SOLN
100.0000 meq | Freq: Once | INTRAVENOUS | Status: AC
  Administered 2021-07-10: 100 meq via INTRAVENOUS
  Filled 2021-07-09: qty 50

## 2021-07-09 MED ORDER — DEXTROSE 50 % IV SOLN
1.0000 | Freq: Once | INTRAVENOUS | Status: DC
  Filled 2021-07-09: qty 50

## 2021-07-09 MED ORDER — SODIUM CHLORIDE 0.9 % IV BOLUS (SEPSIS): 1000.0000 mL | Freq: Once | INTRAVENOUS | Status: DC

## 2021-07-09 MED ORDER — ALBUTEROL SULFATE (2.5 MG/3ML) 0.083% IN NEBU
10.0000 mg | INHALATION_SOLUTION | Freq: Once | RESPIRATORY_TRACT | Status: AC
  Administered 2021-07-10: 10 mg via RESPIRATORY_TRACT
  Filled 2021-07-09: qty 12

## 2021-07-09 MED ORDER — FUROSEMIDE 10 MG/ML IJ SOLN
40.0000 mg | Freq: Once | INTRAMUSCULAR | Status: DC
  Filled 2021-07-09: qty 4

## 2021-07-09 MED ORDER — INSULIN ASPART 100 UNIT/ML IV SOLN
5.0000 [IU] | Freq: Once | INTRAVENOUS | Status: DC
  Filled 2021-07-09: qty 0.05

## 2021-07-09 MED ORDER — SODIUM CHLORIDE 0.9 % IV BOLUS
500.0000 mL | Freq: Once | INTRAVENOUS | Status: AC
  Administered 2021-07-10: 500 mL via INTRAVENOUS

## 2021-07-09 MED ORDER — SODIUM ZIRCONIUM CYCLOSILICATE 10 G PO PACK
10.0000 g | PACK | Freq: Once | ORAL | Status: AC
  Administered 2021-07-10: 10 g via ORAL
  Filled 2021-07-09: qty 1

## 2021-07-09 MED ORDER — SODIUM BICARBONATE 8.4 % IV SOLN
Freq: Once | INTRAVENOUS | Status: AC
  Filled 2021-07-09: qty 150

## 2021-07-09 NOTE — ED Provider Notes (Signed)
[place] DEPT Provider Note  CSN: [id number] Arrival date & time: 07/09/21 2144  Chief Complaint(s) Abdominal Pain  HPI Cynthia [name] is a 60 y.o. female with a past medical history listed below including Crohn's requiring ileostomy recently admitted to the hospital for nausea vomiting and dehydration who was discharged last week returns tonight for generalized fatigue and worsening abdominal pain.  Patient reports that since being discharged she has not been taking the medication that she was prescribed which included Keflex for possible abdominal wall cellulitis.  She reports that she got the medication yesterday.  She reports that she has had decreased oral intake/hydration.  Reports that she has not had any nausea or vomiting over the past several days.  She does report high output from the ostomy, approximately 4 bags per day of brownish liquid stool.  No blood.  Patient also reported chest discomfort which is resolved after drinking water.  No shortness of breath.  No known fevers.  No coughing or congestion.  No urinary symptoms.   The history is provided by the patient.   Past Medical History Past Medical History:  Diagnosis Date   Anemia 2012   Anxiety    Bronchitis    Crohn's ileitis ([place])    Depression    GERD (gastroesophageal reflux disease)    Headache(784.0)    MIGRAINE   Heart murmur    Hiatal hernia    Hypertension    IBS (irritable bowel syndrome)    Incontinence    MVP (mitral valve prolapse)    RLS (restless legs syndrome)    Tubular adenoma of colon 08/2019   Patient Active Problem List   Diagnosis Date Noted   Nausea and vomiting 06/26/2021   Abdominal pain 06/26/2021   Sepsis ([place]) 06/26/2021   Sepsis due to cellulitis ([place]) 04/05/2021   AKI (acute kidney injury) ([place]) 04/05/2021   Hyperkalemia 04/05/2021   Hyponatremia 04/05/2021   Adjustment disorder 03/14/2021   Unspecified severe protein-calorie malnutrition  ([place]) 03/13/2021   COVID-19 01/25/2021   Leukocytosis 01/24/2021   Crohn's colitis, with intestinal obstruction ([place]) 01/24/2021   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   History of rectal polyp 2020 01/12/2021   Anxiety associated with depression 01/12/2021   Hypokalemia 12/04/2020   Cough 11/27/2020   Otalgia of both ears 11/27/2020   Body aches 11/27/2020   Fever 11/27/2020   High risk medication use 11/27/2020   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[place]) 06/27/2020   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 Authorizing Provider  esomeprazole (NEXIUM) 40 MG capsule TAKE ONE (1) CAPSULE BY MOUTH 2 TIMES DAILY Patient taking differently: Take 40 mg by mouth 2 (two) times daily before a meal. 03/05/21   Denita Lung, MD  loperamide (IMODIUM) 2 MG capsule Take 1 capsule (2 mg total) by mouth daily as needed for  diarrhea or loose stools. 06/28/21   Aline August, MD  polycarbophil (FIBERCON) 625 MG tablet Take 1 tablet (625 mg total) by mouth 2 (two) times daily. Patient not taking: No sig reported 01/13/21   Rai, Vernelle Emerald, MD  pramipexole (MIRAPEX) 1 MG tablet Take 1 tablet (1 mg total) by mouth daily. 03/05/21   Denita Lung, MD  venlafaxine XR (EFFEXOR-XR) 150 MG 24 hr capsule Take 2 capsules (300 mg total) by mouth  daily with breakfast. 03/05/21   Denita Lung, MD                                                                                                                                    Past Surgical History Past Surgical History:  Procedure Laterality Date   ABDOMINAL HYSTERECTOMY  06/17/2011   Procedure: HYSTERECTOMY ABDOMINAL;  Surgeon: Arloa Koh;  Location: [facility];  Service: Gynecology;  Laterality: N/A;  Converted to Abdominal Hysterectomy with lysis of adhesions    ANTERIOR AND POSTERIOR REPAIR  02/25/2012   Procedure: ANTERIOR (CYSTOCELE) AND POSTERIOR REPAIR (RECTOCELE);  Surgeon: Daria Pastures, MD;  Location: [facility];  Service: Gynecology;  Laterality: N/A;  ANterior cystocele repair ONLY   BIOPSY  08/29/2019   Procedure: BIOPSY;  Surgeon: Lavena Bullion, DO;  Location: [facility];  Service: Gastroenterology;;   BLADDER SUSPENSION  02/25/2012   Procedure: TRANSVAGINAL TAPE (TVT) PROCEDURE;  Surgeon: Daria Pastures, MD;  Location: [facility];  Service: Gynecology;  Laterality: N/A;   COLONOSCOPY WITH PROPOFOL N/A 08/29/2019   Procedure: COLONOSCOPY WITH PROPOFOL;  Surgeon: Lavena Bullion, DO;  Location: [facility];  Service: Gastroenterology;  Laterality: N/A;   CYSTOSCOPY  02/25/2012   Procedure: CYSTOSCOPY;  Surgeon: Daria Pastures, MD;  Location: [facility];  Service: Gynecology;  Laterality: N/A;   ILEOSTOMY     KNEE ARTHROSCOPY  2010   LAPAROSCOPIC ASSISTED VAGINAL HYSTERECTOMY  06/17/2011   Procedure: LAPAROSCOPIC ASSISTED VAGINAL HYSTERECTOMY;  Surgeon: Arloa Koh;  Location: [facility];  Service: Gynecology;  Laterality: N/A;  Attempted    POLYPECTOMY  08/29/2019   Procedure: POLYPECTOMY;  Surgeon: Lavena Bullion, DO;  Location: [facility];  Service: Gastroenterology;;   SALPINGOOPHORECTOMY  06/17/2011   Procedure: SALPINGO OOPHERECTOMY;  Surgeon: Arloa Koh;  Location: [facility];  Service: Gynecology;  Laterality: Bilateral;   TONSILLECTOMY     Family  History Family History  Problem Relation Age of Onset   Diabetes Mother    Dementia Mother    Clotting disorder Father    Macular degeneration Father    Crohn's disease Sister    Colon cancer Neg Hx    Esophageal cancer Neg Hx    Pancreatic cancer Neg Hx    Stomach cancer Neg Hx    Liver disease Neg Hx     Social History Social History   Tobacco Use   Smoking status: Some Days  Packs/day: 0.00    Types: Cigarettes   Smokeless tobacco: Never   Tobacco comments:    none in 2 weeks as of 12/04/20  Vaping Use   Vaping Use: Former  Substance Use Topics   Alcohol use: Not Currently   Drug use: Not Currently    Types: Cocaine    Comment: clean x 8 weeks   Allergies Iodine  Review of Systems Review of Systems All other systems are reviewed and are negative for acute change except as noted in the HPI  Physical Exam Vital Signs  I have reviewed the triage vital signs BP 101/78   Pulse (!) 109   Temp 98 F (36.7 C) (Oral)   Resp 13   Ht 5' 4"  (1.626 m)   Wt 52.2 kg   LMP 06/10/2011 (Exact Date)   SpO2 99%   BMI 19.74 kg/m   Physical Exam Vitals reviewed.  Constitutional:      General: She is not in acute distress.    Appearance: She is well-developed. She is ill-appearing and toxic-appearing. She is not diaphoretic.  HENT:     Head: Normocephalic and atraumatic.     Nose: Nose normal.     Mouth/Throat:     Mouth: Mucous membranes are dry.  Eyes:     General: No scleral icterus.       Right eye: No discharge.        Left eye: No discharge.     Conjunctiva/sclera: Conjunctivae normal.     Pupils: Pupils are equal, round, and reactive to light.  Cardiovascular:     Rate and Rhythm: Regular rhythm. Tachycardia present.     Heart sounds: No murmur heard.   No friction rub. No gallop.  Pulmonary:     Effort: Pulmonary effort is normal. No respiratory distress.     Breath sounds: Normal breath sounds. No stridor. No rales.  Abdominal:     General: There is  no distension.     Palpations: Abdomen is soft.     Tenderness: There is generalized abdominal tenderness. There is no guarding or rebound.    Musculoskeletal:        General: No tenderness.     Cervical back: Normal range of motion and neck supple.  Skin:    General: Skin is warm and dry.  Neurological:     Mental Status: She is alert and oriented to person, place, and time.    ED Results and Treatments Labs (all labs ordered are listed, but only abnormal results are displayed) Labs Reviewed  COMPREHENSIVE METABOLIC PANEL - Abnormal; Notable for the following components:      Result Value   Sodium 120 (*)    Potassium >7.5 (*)    Chloride 88 (*)    CO2 14 (*)    BUN 89 (*)    Creatinine, Ser 5.24 (*)    Total Protein 8.5 (*)    GFR, Estimated 9 (*)    Anion gap 18 (*)    All other components within normal limits  LIPASE, BLOOD - Abnormal; Notable for the following components:   Lipase 116 (*)    All other components within normal limits  BASIC METABOLIC PANEL - Abnormal; Notable for the following components:   Sodium 120 (*)    Potassium >7.5 (*)    Chloride 85 (*)    CO2 16 (*)    BUN 90 (*)    Creatinine, Ser 5.38 (*)    GFR, Estimated 9 (*)  Anion gap 19 (*)    All other components within normal limits  BLOOD GAS, VENOUS - Abnormal; Notable for the following components:   pH, Ven 7.238 (*)    pCO2, Ven 36.8 (*)    pO2, Ven 21.5 (*)    Bicarbonate 15.1 (*)    Acid-base deficit 11.4 (*)    All other components within normal limits  RESP PANEL BY RT-PCR (FLU A&B, COVID) ARPGX2  LACTIC ACID, PLASMA  CBC WITH DIFFERENTIAL/PLATELET  URINALYSIS, ROUTINE W REFLEX MICROSCOPIC  CBC WITH DIFFERENTIAL/PLATELET  I-STAT VENOUS BLOOD GAS, ED  CBG MONITORING, ED  TROPONIN I (HIGH SENSITIVITY)  TROPONIN I (HIGH SENSITIVITY)                                                                                                                         EKG  EKG  Interpretation  Date/Time:  Tuesday July 09 2021 23:09:14 EDT Ventricular Rate:  101 PR Interval:  155 QRS Duration: 95 QT Interval:  320 QTC Calculation: 415 R Axis:   267 Text Interpretation: Sinus tachycardia Biatrial enlargement peaked TWaves Reconfirmed by Addison Lank [phone]) on 07/09/2021 11:22:45 PM       Radiology DG Chest Port 1 View  Result Date: 07/09/2021 CLINICAL DATA:  Tachycardia nausea EXAM: PORTABLE CHEST 1 VIEW COMPARISON:  06/25/2021 FINDINGS: The heart size and mediastinal contours are within normal limits. Both lungs are clear. The visualized skeletal structures are unremarkable. IMPRESSION: No active disease. Electronically Signed   By: Donavan Foil M.D.   On: 07/09/2021 23:39    Pertinent labs & imaging results that were available during my care of the patient were reviewed by me and considered in my medical decision making (see MDM for details).  Medications Ordered in ED Medications  sodium bicarbonate 150 mEq in dextrose 5 % 1,150 mL infusion ( Intravenous New Bag/Given 07/10/21 0012)  cefTRIAXone (ROCEPHIN) 1 g in sodium chloride 0.9 % 100 mL IVPB (has no administration in time range)  calcium gluconate inj 10% (1 g) URGENT USE ONLY! (1 g Intravenous Given 07/10/21 0011)  albuterol (PROVENTIL) (2.5 MG/3ML) 0.083% nebulizer solution 10 mg (10 mg Nebulization Given 07/10/21 0047)  sodium bicarbonate injection 100 mEq (100 mEq Intravenous Given 07/10/21 0047)  sodium chloride 0.9 % bolus 500 mL (0 mLs Intravenous Stopped 07/10/21 0053)  sodium zirconium cyclosilicate (LOKELMA) packet 10 g (10 g Oral Given 07/10/21 0042)  Procedures .1-3 Lead EKG Interpretation  Date/Time: 07/10/2021 12:52 AM Performed by: Fatima Blank, MD Authorized by: Fatima Blank, MD     Interpretation: abnormal     ECG rate:  121   ECG rate  assessment: tachycardic     Rhythm: sinus tachycardia     Ectopy: none     Conduction: normal   Ultrasound ED Peripheral IV (Provider)  Date/Time: 07/10/2021 12:52 AM Performed by: Fatima Blank, MD Authorized by: Fatima Blank, MD   Procedure details:    Indications: multiple failed IV attempts and poor IV access     Skin Prep: chlorhexidine gluconate     Location:  Left AC   Angiocath:  20 G   Bedside Ultrasound Guided: Yes     Images: archived     Patient tolerated procedure without complications: Yes     Dressing applied: Yes   .Critical Care  Date/Time: 07/10/2021 12:53 AM Performed by: Fatima Blank, MD Authorized by: Fatima Blank, MD   Critical care provider statement:    Critical care time (minutes):  80   Critical care was necessary to treat or prevent imminent or life-threatening deterioration of the following conditions:  Dehydration, metabolic crisis, renal failure, shock and circulatory failure   Critical care was time spent personally by me on the following activities:  Discussions with consultants, evaluation of patient's response to treatment, examination of patient, ordering and performing treatments and interventions, ordering and review of laboratory studies, ordering and review of radiographic studies, pulse oximetry, re-evaluation of patient's condition, obtaining history from patient or surrogate and review of old charts   Care discussed with: admitting provider    (including critical care time)  Medical Decision Making / ED Course I have reviewed the nursing notes for this encounter and the patient's prior records (if available in EHR or on provided paperwork).  Cynthia [name] was evaluated in Emergency Department on 07/10/2021 for the symptoms described in the history of present illness. She was evaluated in the context of the global COVID-19 pandemic, which necessitated consideration that the patient might be at risk for  infection with the SARS-CoV-2 virus that causes COVID-19. Institutional protocols and algorithms that pertain to the evaluation of patients at risk for COVID-19 are in a state of rapid change based on information released by regulatory bodies including the CDC and federal and state organizations. These policies and algorithms were followed during the patient's care in the ED.     Patient is ill-appearing. Afebrile but tachycardic with soft blood pressures ranging in the 90s to low 100s. Seen in the MSE process were screening labs obtain notable for AKI and hyperkalemia greater than 7.5.  Patient immediately brought back to resuscitation room. EKG obtain and notable for sinus tachycardia with peaked T waves.  Stable QRS. Multiple peripheral IVs were unsuccessful by nursing.  I placed a an ultrasound-guided IV.  Temporizing measures were ordered. Consulted nephrology who requested specific temporizing measures documented above.  Pertinent labs & imaging results that were available during my care of the patient were reviewed by me and considered in my medical decision making:  CBC with mild leukocytosis.  No anemia. Repeat metabolic panel with minimally improved potassium from 7.8 down to 7.7. Additional IV fluids given. Low-dose insulin given. Per request of nephrology additional amp of bicarb given.  Patient's blood pressures were downtrending requiring Levophed. Consult to ICU for admission. Nephrology will coordinate for CRRT.  Final Clinical Impression(s) / ED Diagnoses Final  diagnoses:  Tachycardia  Acute renal failure, unspecified acute renal failure type ([place])  Hyperkalemia     This chart was dictated using voice recognition software.  Despite best efforts to proofread,  errors can occur which can change the documentation meaning.    Fatima Blank, MD 07/10/21 [phone]

## 2021-07-09 NOTE — ED Triage Notes (Signed)
Pt BIB EMS. Pt reports not taking her discharge (Thursday) medications and now feels worse. Pt reports abdominal pain, diarrhea, nausea, and vomiting.

## 2021-07-09 NOTE — ED Notes (Signed)
Unable to collect lav

## 2021-07-10 ENCOUNTER — Emergency Department (HOSPITAL_COMMUNITY): Payer: Medicaid Other

## 2021-07-10 ENCOUNTER — Inpatient Hospital Stay (HOSPITAL_COMMUNITY): Payer: Medicaid Other

## 2021-07-10 DIAGNOSIS — K561 Intussusception: Secondary | ICD-10-CM | POA: Diagnosis present

## 2021-07-10 DIAGNOSIS — Z833 Family history of diabetes mellitus: Secondary | ICD-10-CM | POA: Diagnosis not present

## 2021-07-10 DIAGNOSIS — R54 Age-related physical debility: Secondary | ICD-10-CM | POA: Diagnosis present

## 2021-07-10 DIAGNOSIS — E872 Acidosis: Secondary | ICD-10-CM | POA: Diagnosis present

## 2021-07-10 DIAGNOSIS — K5000 Crohn's disease of small intestine without complications: Secondary | ICD-10-CM | POA: Diagnosis present

## 2021-07-10 DIAGNOSIS — E875 Hyperkalemia: Secondary | ICD-10-CM

## 2021-07-10 DIAGNOSIS — E8729 Other acidosis: Secondary | ICD-10-CM | POA: Diagnosis present

## 2021-07-10 DIAGNOSIS — E860 Dehydration: Secondary | ICD-10-CM | POA: Diagnosis present

## 2021-07-10 DIAGNOSIS — R000 Tachycardia, unspecified: Secondary | ICD-10-CM

## 2021-07-10 DIAGNOSIS — Z820 Family history of epilepsy and other diseases of the nervous system: Secondary | ICD-10-CM | POA: Diagnosis not present

## 2021-07-10 DIAGNOSIS — F32A Depression, unspecified: Secondary | ICD-10-CM | POA: Diagnosis present

## 2021-07-10 DIAGNOSIS — N179 Acute kidney failure, unspecified: Secondary | ICD-10-CM | POA: Diagnosis present

## 2021-07-10 DIAGNOSIS — Z932 Ileostomy status: Secondary | ICD-10-CM | POA: Diagnosis not present

## 2021-07-10 DIAGNOSIS — E43 Unspecified severe protein-calorie malnutrition: Secondary | ICD-10-CM | POA: Diagnosis present

## 2021-07-10 DIAGNOSIS — K50013 Crohn's disease of small intestine with fistula: Secondary | ICD-10-CM

## 2021-07-10 DIAGNOSIS — R579 Shock, unspecified: Secondary | ICD-10-CM | POA: Diagnosis not present

## 2021-07-10 DIAGNOSIS — I959 Hypotension, unspecified: Secondary | ICD-10-CM | POA: Diagnosis not present

## 2021-07-10 DIAGNOSIS — I341 Nonrheumatic mitral (valve) prolapse: Secondary | ICD-10-CM | POA: Diagnosis present

## 2021-07-10 DIAGNOSIS — R64 Cachexia: Secondary | ICD-10-CM | POA: Diagnosis present

## 2021-07-10 DIAGNOSIS — I10 Essential (primary) hypertension: Secondary | ICD-10-CM | POA: Diagnosis present

## 2021-07-10 DIAGNOSIS — Z79899 Other long term (current) drug therapy: Secondary | ICD-10-CM | POA: Diagnosis not present

## 2021-07-10 DIAGNOSIS — L03311 Cellulitis of abdominal wall: Secondary | ICD-10-CM | POA: Diagnosis not present

## 2021-07-10 DIAGNOSIS — D84821 Immunodeficiency due to drugs: Secondary | ICD-10-CM | POA: Diagnosis present

## 2021-07-10 DIAGNOSIS — E871 Hypo-osmolality and hyponatremia: Secondary | ICD-10-CM

## 2021-07-10 DIAGNOSIS — Z20822 Contact with and (suspected) exposure to covid-19: Secondary | ICD-10-CM | POA: Diagnosis present

## 2021-07-10 DIAGNOSIS — Z832 Family history of diseases of the blood and blood-forming organs and certain disorders involving the immune mechanism: Secondary | ICD-10-CM | POA: Diagnosis not present

## 2021-07-10 DIAGNOSIS — F1410 Cocaine abuse, uncomplicated: Secondary | ICD-10-CM | POA: Diagnosis present

## 2021-07-10 DIAGNOSIS — R933 Abnormal findings on diagnostic imaging of other parts of digestive tract: Secondary | ICD-10-CM | POA: Diagnosis not present

## 2021-07-10 DIAGNOSIS — R571 Hypovolemic shock: Secondary | ICD-10-CM | POA: Diagnosis present

## 2021-07-10 DIAGNOSIS — Z681 Body mass index (BMI) 19 or less, adult: Secondary | ICD-10-CM | POA: Diagnosis not present

## 2021-07-10 DIAGNOSIS — G2581 Restless legs syndrome: Secondary | ICD-10-CM | POA: Diagnosis present

## 2021-07-10 DIAGNOSIS — K219 Gastro-esophageal reflux disease without esophagitis: Secondary | ICD-10-CM | POA: Diagnosis present

## 2021-07-10 DIAGNOSIS — R627 Adult failure to thrive: Secondary | ICD-10-CM | POA: Diagnosis present

## 2021-07-10 LAB — BASIC METABOLIC PANEL
Anion gap: 19 — ABNORMAL HIGH (ref 5–15)
Anion gap: 20 — ABNORMAL HIGH (ref 5–15)
BUN: 90 mg/dL — ABNORMAL HIGH (ref 8–23)
BUN: 90 mg/dL — ABNORMAL HIGH (ref 8–23)
CO2: 16 mmol/L — ABNORMAL LOW (ref 22–32)
CO2: 19 mmol/L — ABNORMAL LOW (ref 22–32)
Calcium: 10 mg/dL (ref 8.9–10.3)
Calcium: 8.8 mg/dL — ABNORMAL LOW (ref 8.9–10.3)
Chloride: 85 mmol/L — ABNORMAL LOW (ref 98–111)
Chloride: 85 mmol/L — ABNORMAL LOW (ref 98–111)
Creatinine, Ser: 4.9 mg/dL — ABNORMAL HIGH (ref 0.44–1.00)
Creatinine, Ser: 5.38 mg/dL — ABNORMAL HIGH (ref 0.44–1.00)
GFR, Estimated: 10 mL/min — ABNORMAL LOW (ref >60)
GFR, Estimated: 9 mL/min — ABNORMAL LOW (ref >60)
Glucose, Bld: 292 mg/dL — ABNORMAL HIGH (ref 70–99)
Glucose, Bld: 95 mg/dL (ref 70–99)
Potassium: 4.2 mmol/L (ref 3.5–5.1)
Potassium: >7.5 mmol/L (ref 3.5–5.1)
Sodium: 120 mmol/L — ABNORMAL LOW (ref 135–145)
Sodium: 124 mmol/L — ABNORMAL LOW (ref 135–145)

## 2021-07-10 LAB — RENAL FUNCTION PANEL
Albumin: 3 g/dL — ABNORMAL LOW (ref 3.5–5.0)
Anion gap: 15 (ref 5–15)
BUN: 70 mg/dL — ABNORMAL HIGH (ref 8–23)
CO2: 21 mmol/L — ABNORMAL LOW (ref 22–32)
Calcium: 8.5 mg/dL — ABNORMAL LOW (ref 8.9–10.3)
Chloride: 91 mmol/L — ABNORMAL LOW (ref 98–111)
Creatinine, Ser: 4.37 mg/dL — ABNORMAL HIGH (ref 0.44–1.00)
GFR, Estimated: 11 mL/min — ABNORMAL LOW
Glucose, Bld: 257 mg/dL — ABNORMAL HIGH (ref 70–99)
Phosphorus: 6.6 mg/dL — ABNORMAL HIGH (ref 2.5–4.6)
Potassium: 4.1 mmol/L (ref 3.5–5.1)
Sodium: 127 mmol/L — ABNORMAL LOW (ref 135–145)

## 2021-07-10 LAB — BLOOD GAS, VENOUS
Acid-base deficit: 11.4 mmol/L — ABNORMAL HIGH (ref 0.0–2.0)
Bicarbonate: 15.1 mmol/L — ABNORMAL LOW (ref 20.0–28.0)
O2 Saturation: 27.3 %
Patient temperature: 98.6
pCO2, Ven: 36.8 mmHg — ABNORMAL LOW (ref 44.0–60.0)
pH, Ven: 7.238 — ABNORMAL LOW (ref 7.250–7.430)
pO2, Ven: 21.5 mmHg — CL (ref 32.0–45.0)

## 2021-07-10 LAB — CORTISOL: Cortisol, Plasma: 37.4 ug/dL

## 2021-07-10 LAB — RESP PANEL BY RT-PCR (FLU A&B, COVID) ARPGX2
Influenza A by PCR: NEGATIVE
Influenza B by PCR: NEGATIVE
SARS Coronavirus 2 by RT PCR: NEGATIVE

## 2021-07-10 LAB — CBC
HCT: 30 % — ABNORMAL LOW (ref 36.0–46.0)
HCT: 29.1 % — ABNORMAL LOW (ref 36.0–46.0)
HCT: 23.2 % — ABNORMAL LOW (ref 36.0–46.0)
Hemoglobin: 10.5 g/dL — ABNORMAL LOW (ref 12.0–15.0)
Hemoglobin: 10.1 g/dL — ABNORMAL LOW (ref 12.0–15.0)
Hemoglobin: 7.9 g/dL — ABNORMAL LOW (ref 12.0–15.0)
MCH: 29.5 pg (ref 26.0–34.0)
MCH: 29.4 pg (ref 26.0–34.0)
MCH: 29.4 pg (ref 26.0–34.0)
MCHC: 35 g/dL (ref 30.0–36.0)
MCHC: 34.7 g/dL (ref 30.0–36.0)
MCHC: 34.1 g/dL (ref 30.0–36.0)
MCV: 84.3 fL (ref 80.0–100.0)
MCV: 84.6 fL (ref 80.0–100.0)
MCV: 86.2 fL (ref 80.0–100.0)
Platelets: 429 K/uL — ABNORMAL HIGH (ref 150–400)
Platelets: 424 10*3/uL — ABNORMAL HIGH (ref 150–400)
Platelets: 244 10*3/uL (ref 150–400)
RBC: 3.56 MIL/uL — ABNORMAL LOW (ref 3.87–5.11)
RBC: 3.44 MIL/uL — ABNORMAL LOW (ref 3.87–5.11)
RBC: 2.69 MIL/uL — ABNORMAL LOW (ref 3.87–5.11)
RDW: 12.2 % (ref 11.5–15.5)
RDW: 12.4 % (ref 11.5–15.5)
RDW: 12.2 % (ref 11.5–15.5)
WBC: 11.8 K/uL — ABNORMAL HIGH (ref 4.0–10.5)
WBC: 11.2 10*3/uL — ABNORMAL HIGH (ref 4.0–10.5)
WBC: 5.6 10*3/uL (ref 4.0–10.5)
nRBC: 0 % (ref 0.0–0.2)
nRBC: 0 % (ref 0.0–0.2)
nRBC: 0 % (ref 0.0–0.2)

## 2021-07-10 LAB — COMPREHENSIVE METABOLIC PANEL
ALT: 19 U/L (ref 0–44)
ALT: 15 U/L (ref 0–44)
AST: 22 U/L (ref 15–41)
AST: 17 U/L (ref 15–41)
Albumin: 4 g/dL (ref 3.5–5.0)
Albumin: 2.4 g/dL — ABNORMAL LOW (ref 3.5–5.0)
Alkaline Phosphatase: 64 U/L (ref 38–126)
Alkaline Phosphatase: 39 U/L (ref 38–126)
Anion gap: 16 — ABNORMAL HIGH (ref 5–15)
Anion gap: 7 (ref 5–15)
BUN: 82 mg/dL — ABNORMAL HIGH (ref 8–23)
BUN: 58 mg/dL — ABNORMAL HIGH (ref 8–23)
CO2: 15 mmol/L — ABNORMAL LOW (ref 22–32)
CO2: 21 mmol/L — ABNORMAL LOW (ref 22–32)
Calcium: 9.7 mg/dL (ref 8.9–10.3)
Calcium: 7.1 mg/dL — ABNORMAL LOW (ref 8.9–10.3)
Chloride: 92 mmol/L — ABNORMAL LOW (ref 98–111)
Chloride: 95 mmol/L — ABNORMAL LOW (ref 98–111)
Creatinine, Ser: 5.09 mg/dL — ABNORMAL HIGH (ref 0.44–1.00)
Creatinine, Ser: 2.9 mg/dL — ABNORMAL HIGH (ref 0.44–1.00)
GFR, Estimated: 9 mL/min — ABNORMAL LOW (ref >60)
GFR, Estimated: 18 mL/min — ABNORMAL LOW (ref >60)
Glucose, Bld: 100 mg/dL — ABNORMAL HIGH (ref 70–99)
Glucose, Bld: 98 mg/dL (ref 70–99)
Potassium: >7.5 mmol/L (ref 3.5–5.1)
Potassium: 4.2 mmol/L (ref 3.5–5.1)
Sodium: 123 mmol/L — ABNORMAL LOW (ref 135–145)
Sodium: 123 mmol/L — ABNORMAL LOW (ref 135–145)
Total Bilirubin: 0.7 mg/dL (ref 0.3–1.2)
Total Bilirubin: 0.6 mg/dL (ref 0.3–1.2)
Total Protein: 7.8 g/dL (ref 6.5–8.1)
Total Protein: 4.5 g/dL — ABNORMAL LOW (ref 6.5–8.1)

## 2021-07-10 LAB — CBC WITH DIFFERENTIAL/PLATELET
Abs Immature Granulocytes: 0.1 10*3/uL — ABNORMAL HIGH (ref 0.00–0.07)
Basophils Absolute: 0 10*3/uL (ref 0.0–0.1)
Basophils Relative: 0 %
Eosinophils Absolute: 0 10*3/uL (ref 0.0–0.5)
Eosinophils Relative: 0 %
HCT: 43.6 % (ref 36.0–46.0)
Hemoglobin: 14.9 g/dL (ref 12.0–15.0)
Immature Granulocytes: 1 %
Lymphocytes Relative: 15 %
Lymphs Abs: 1.7 10*3/uL (ref 0.7–4.0)
MCH: 29.3 pg (ref 26.0–34.0)
MCHC: 34.2 g/dL (ref 30.0–36.0)
MCV: 85.8 fL (ref 80.0–100.0)
Monocytes Absolute: 0.4 10*3/uL (ref 0.1–1.0)
Monocytes Relative: 3 %
Neutro Abs: 9.2 10*3/uL — ABNORMAL HIGH (ref 1.7–7.7)
Neutrophils Relative %: 81 %
Platelets: 517 10*3/uL — ABNORMAL HIGH (ref 150–400)
RBC: 5.08 MIL/uL (ref 3.87–5.11)
RDW: 12.3 % (ref 11.5–15.5)
WBC: 11.4 10*3/uL — ABNORMAL HIGH (ref 4.0–10.5)
nRBC: 0 % (ref 0.0–0.2)

## 2021-07-10 LAB — BLOOD GAS, ARTERIAL
Acid-base deficit: 2.4 mmol/L — ABNORMAL HIGH (ref 0.0–2.0)
Bicarbonate: 19.7 mmol/L — ABNORMAL LOW (ref 20.0–28.0)
O2 Saturation: 99.6 %
Patient temperature: 98.6
pCO2 arterial: 26.7 mmHg — ABNORMAL LOW (ref 32.0–48.0)
pH, Arterial: 7.482 — ABNORMAL HIGH (ref 7.350–7.450)
pO2, Arterial: 138 mmHg — ABNORMAL HIGH (ref 83.0–108.0)

## 2021-07-10 LAB — MRSA NEXT GEN BY PCR, NASAL: MRSA by PCR Next Gen: NOT DETECTED

## 2021-07-10 LAB — URINALYSIS, ROUTINE W REFLEX MICROSCOPIC
Bilirubin Urine: NEGATIVE
Glucose, UA: NEGATIVE mg/dL
Hgb urine dipstick: NEGATIVE
Ketones, ur: NEGATIVE mg/dL
Leukocytes,Ua: NEGATIVE
Nitrite: NEGATIVE
Protein, ur: NEGATIVE mg/dL
Specific Gravity, Urine: 1.025 (ref 1.005–1.030)
pH: 5.5 (ref 5.0–8.0)

## 2021-07-10 LAB — RAPID URINE DRUG SCREEN, HOSP PERFORMED
Amphetamines: NOT DETECTED
Barbiturates: NOT DETECTED
Benzodiazepines: NOT DETECTED
Cocaine: POSITIVE — AB
Opiates: NOT DETECTED
Tetrahydrocannabinol: NOT DETECTED

## 2021-07-10 LAB — LACTIC ACID, PLASMA
Lactic Acid, Venous: 1.8 mmol/L (ref 0.5–1.9)
Lactic Acid, Venous: 1.1 mmol/L (ref 0.5–1.9)

## 2021-07-10 LAB — PROCALCITONIN: Procalcitonin: <0.1 ng/mL

## 2021-07-10 LAB — SODIUM, URINE, RANDOM: Sodium, Ur: <10 mmol/L

## 2021-07-10 LAB — TROPONIN I (HIGH SENSITIVITY)
Troponin I (High Sensitivity): 13 ng/L (ref <18)
Troponin I (High Sensitivity): 10 ng/L (ref <18)

## 2021-07-10 LAB — CBG MONITORING, ED: Glucose-Capillary: 94 mg/dL (ref 70–99)

## 2021-07-10 LAB — CREATININE, URINE, RANDOM: Creatinine, Urine: 128.77 mg/dL

## 2021-07-10 LAB — MAGNESIUM: Magnesium: 1.3 mg/dL — ABNORMAL LOW (ref 1.7–2.4)

## 2021-07-10 MED ORDER — CALCIUM GLUCONATE 10 % IV SOLN
1.0000 g | Freq: Once | INTRAVENOUS | Status: DC
  Filled 2021-07-10: qty 10

## 2021-07-10 MED ORDER — DOCUSATE SODIUM 100 MG PO CAPS: 100.0000 mg | ORAL_CAPSULE | Freq: Two times a day (BID) | ORAL | Status: DC | PRN

## 2021-07-10 MED ORDER — CHLORHEXIDINE GLUCONATE CLOTH 2 % EX PADS
6.0000 | MEDICATED_PAD | Freq: Every day | CUTANEOUS | Status: DC
  Administered 2021-07-10 – 2021-07-12 (×4): 6 via TOPICAL

## 2021-07-10 MED ORDER — SODIUM CHLORIDE 0.9 % IV BOLUS
2500.0000 mL | Freq: Once | INTRAVENOUS | Status: AC
  Administered 2021-07-10: 2500 mL via INTRAVENOUS

## 2021-07-10 MED ORDER — INSULIN ASPART 100 UNIT/ML IV SOLN
5.0000 [IU] | Freq: Once | INTRAVENOUS | Status: AC
  Administered 2021-07-10: 5 [IU] via INTRAVENOUS
  Filled 2021-07-10: qty 0.05

## 2021-07-10 MED ORDER — MAGNESIUM SULFATE 4 GM/100ML IV SOLN
4.0000 g | Freq: Once | INTRAVENOUS | Status: AC
  Administered 2021-07-10: 4 g via INTRAVENOUS
  Filled 2021-07-10: qty 100

## 2021-07-10 MED ORDER — HEPARIN SODIUM (PORCINE) 1000 UNIT/ML DIALYSIS: 1000.0000 [IU] | INTRAMUSCULAR | Status: DC | PRN

## 2021-07-10 MED ORDER — ORAL CARE MOUTH RINSE
15.0000 mL | Freq: Two times a day (BID) | OROMUCOSAL | Status: DC
  Administered 2021-07-10 – 2021-07-13 (×7): 15 mL via OROMUCOSAL

## 2021-07-10 MED ORDER — ACETAMINOPHEN 325 MG PO TABS
650.0000 mg | ORAL_TABLET | ORAL | Status: DC | PRN
  Administered 2021-07-10 – 2021-07-13 (×6): 650 mg via ORAL
  Filled 2021-07-10 (×6): qty 2

## 2021-07-10 MED ORDER — PRISMASOL BGK 0/2.5 32-2.5 MEQ/L EC SOLN: Status: DC

## 2021-07-10 MED ORDER — POLYETHYLENE GLYCOL 3350 17 G PO PACK
17.0000 g | PACK | Freq: Every day | ORAL | Status: DC | PRN
Start: 2021-07-10 — End: 2021-07-10

## 2021-07-10 MED ORDER — SODIUM CHLORIDE 0.9 % IV BOLUS
500.0000 mL | Freq: Once | INTRAVENOUS | Status: AC
  Administered 2021-07-10: 500 mL via INTRAVENOUS

## 2021-07-10 MED ORDER — CALCIUM GLUCONATE 10 % IV SOLN
1.0000 g | Freq: Once | INTRAVENOUS | Status: AC
  Administered 2021-07-10: 1 g via INTRAVENOUS

## 2021-07-10 MED ORDER — LACTATED RINGERS IV BOLUS
1000.0000 mL | Freq: Once | INTRAVENOUS | Status: AC
  Administered 2021-07-10: 1000 mL via INTRAVENOUS

## 2021-07-10 MED ORDER — ONDANSETRON HCL 4 MG/2ML IJ SOLN
4.0000 mg | Freq: Once | INTRAMUSCULAR | Status: AC
  Administered 2021-07-10: 4 mg via INTRAVENOUS

## 2021-07-10 MED ORDER — SODIUM CHLORIDE 0.9 % IV SOLN
1.0000 g | Freq: Once | INTRAVENOUS | Status: AC
  Administered 2021-07-10: 1 g via INTRAVENOUS
  Filled 2021-07-10: qty 10

## 2021-07-10 MED ORDER — NOREPINEPHRINE 4 MG/250ML-% IV SOLN
0.0000 ug/min | INTRAVENOUS | Status: DC
  Administered 2021-07-10: 16 ug/min via INTRAVENOUS
  Administered 2021-07-10: 13 ug/min via INTRAVENOUS
  Administered 2021-07-10: 16 ug/min via INTRAVENOUS
  Administered 2021-07-10: 2 ug/min via INTRAVENOUS
  Filled 2021-07-10 (×4): qty 250

## 2021-07-10 MED ORDER — HEPARIN SODIUM (PORCINE) 5000 UNIT/ML IJ SOLN
5000.0000 [IU] | Freq: Three times a day (TID) | INTRAMUSCULAR | Status: DC
  Administered 2021-07-10 – 2021-07-14 (×13): 5000 [IU] via SUBCUTANEOUS
  Filled 2021-07-10 (×13): qty 1

## 2021-07-10 MED ORDER — SODIUM BICARBONATE 8.4 % IV SOLN
INTRAVENOUS | Status: DC
  Filled 2021-07-10: qty 1000

## 2021-07-10 MED ORDER — HEPARIN SODIUM (PORCINE) 1000 UNIT/ML DIALYSIS
1000.0000 [IU] | INTRAMUSCULAR | Status: DC | PRN
  Administered 2021-07-10: 4000 [IU]
  Filled 2021-07-10: qty 6
  Filled 2021-07-10: qty 5
  Filled 2021-07-10: qty 6

## 2021-07-10 MED ORDER — IOHEXOL 9 MG/ML PO SOLN
ORAL | Status: AC
  Administered 2021-07-10: 500 mL via ORAL
  Filled 2021-07-10: qty 1000

## 2021-07-10 MED ORDER — SODIUM CHLORIDE 0.9 % IV SOLN
1.0000 g | INTRAVENOUS | Status: DC
  Administered 2021-07-11: 1 g via INTRAVENOUS
  Filled 2021-07-10: qty 10

## 2021-07-10 MED ORDER — DEXTROSE 50 % IV SOLN
1.0000 | Freq: Once | INTRAVENOUS | Status: AC
  Administered 2021-07-10: 50 mL via INTRAVENOUS
  Filled 2021-07-10: qty 50

## 2021-07-10 MED ORDER — SODIUM BICARBONATE 8.4 % IV SOLN
50.0000 meq | Freq: Once | INTRAVENOUS | Status: AC
  Administered 2021-07-10: 50 meq via INTRAVENOUS
  Filled 2021-07-10: qty 50

## 2021-07-10 MED ORDER — VENLAFAXINE HCL ER 150 MG PO CP24
300.0000 mg | ORAL_CAPSULE | Freq: Every day | ORAL | Status: DC
  Administered 2021-07-10 – 2021-07-14 (×5): 300 mg via ORAL
  Filled 2021-07-10 (×5): qty 2

## 2021-07-10 MED ORDER — SODIUM CHLORIDE 0.9 % IV SOLN: INTRAVENOUS | Status: DC

## 2021-07-10 MED ORDER — PANTOPRAZOLE SODIUM 40 MG IV SOLR
40.0000 mg | Freq: Every day | INTRAVENOUS | Status: DC
  Administered 2021-07-10 – 2021-07-11 (×2): 40 mg via INTRAVENOUS
  Filled 2021-07-10 (×2): qty 40

## 2021-07-10 MED ORDER — PRAMIPEXOLE DIHYDROCHLORIDE 1 MG PO TABS
1.0000 mg | ORAL_TABLET | Freq: Every day | ORAL | Status: DC
  Administered 2021-07-10 – 2021-07-14 (×5): 1 mg via ORAL
  Filled 2021-07-10 (×5): qty 1

## 2021-07-10 MED ORDER — ONDANSETRON HCL 4 MG/2ML IJ SOLN
INTRAMUSCULAR | Status: AC
  Administered 2021-07-10: 4 mg via INTRAVENOUS
  Filled 2021-07-10: qty 2

## 2021-07-10 MED ORDER — IOHEXOL 9 MG/ML PO SOLN
500.0000 mL | ORAL | Status: AC
  Administered 2021-07-10: 500 mL via ORAL

## 2021-07-10 NOTE — ED Notes (Signed)
Verbal order to give more 1g of calcium from Dr Leonette Monarch.

## 2021-07-10 NOTE — Progress Notes (Signed)
[facility] Progress Note Patient Name: Cynthia [name] DOB: 01-28-1961 MRN: [id number]   Date of Service  07/10/2021  HPI/Events of Note  CXR reviewed s/p dialysis catheter insertion.  eICU Interventions  Catheter in good position, No pneumothorax.        Kerry Kass Zailyn Thoennes 07/10/2021, 6:04 AM

## 2021-07-10 NOTE — Consult Note (Addendum)
[facility] Renal Consultation Note  Requesting MD: Addison Lank, MD Indication for Consultation:  hyperkalemia, AKI  Chief complaint: abdominal pain, n/v and increased ostomy output  HPI:  Cynthia [name] is a 60 y.o. female with a history including Crohn's and hypertension who presented to the hospital with abdominal pain, diarrhea, nausea and vomiting.  She was found to have AKI with a creatinine of 5.2 and a K of greater than 7.5 without hemolysis.  She was given bicarbonate 2 amps and initiated on bicarb gtt.  She was also temporized with calcium gluconate and given lokelma and albuterol and is now s/p insulin and d50 as well.  Nephrology is consulted for assistance with management of hyperkalemia and AKI.  Note that she was recently discharged on 8/26 after presenting with abdominal pain and nausea with vomiting.  Creatinine was 3 on discharge with a baseline of less than 1; Cr imporved to 1.49 on 8/26 on discharge.  Acute kidney injury at that time was attributed to dehydration.  She was given fluids and losartan and HCTZ were held inpatient and she confirms stopping on discharge as well.  In fact, states not taking meds due to n/v.  She reports can't tell about urine output specifically because "everything goes through the bag" bag".  She states has had an ostomy since April and has had copious amounts of output recently.  She denies any NSAID's - only tylenol.  She and I discussed risks/benefits/indications for dialysis and she does consent to RRT.  She has been hypotensive so triage location was changed from [facility] to [facility] ICU. Pulm is seeing her now as well and is planning for vascath.  She has been initiated on levo and is up from 8 mcg levo on my exam to 35 mcg levo now per charge RN report.  She is getting NS as well as the bicarb gtt.     Creat  Date/Time Value Ref Range Status  05/08/2016 07:41 AM 0.94 0.50 - 1.05 mg/dL Corrected    Comment:      For patients  > or = 62 years of age: The upper reference limit for Creatinine is approximately 13% higher for people identified as African-American.     Amended report.   10/01/2015 10:41 AM 0.86 0.50 - 1.05 mg/dL Final  09/25/2014 11:46 AM 0.77 0.50 - 1.10 mg/dL Final  10/25/2013 09:30 AM 0.83 0.50 - 1.10 mg/dL Final  09/19/2013 09:48 AM 0.78 0.50 - 1.10 mg/dL Final  09/19/2011 09:41 AM 0.96 0.50 - 1.10 mg/dL Final   Creatinine, Ser  Date/Time Value Ref Range Status  07/10/2021 01:06 AM 5.09 (H) 0.44 - 1.00 mg/dL Final  07/09/2021 11:41 PM 5.38 (H) 0.44 - 1.00 mg/dL Final  07/09/2021 10:00 PM 5.24 (H) 0.44 - 1.00 mg/dL Final  06/28/2021 05:41 AM 1.49 (H) 0.44 - 1.00 mg/dL Final  06/27/2021 05:18 AM 1.71 (H) 0.44 - 1.00 mg/dL Final  06/26/2021 04:22 AM 2.38 (H) 0.44 - 1.00 mg/dL Final  06/25/2021 09:25 PM 3.06 (H) 0.44 - 1.00 mg/dL Final  04/09/2021 10:09 AM 0.82 0.44 - 1.00 mg/dL Final  04/08/2021 06:06 AM 0.93 0.44 - 1.00 mg/dL Final  04/07/2021 05:21 AM 0.85 0.44 - 1.00 mg/dL Final  04/06/2021 07:11 AM 0.93 0.44 - 1.00 mg/dL Final  04/05/2021 07:18 PM 1.28 (H) 0.44 - 1.00 mg/dL Final  03/12/2021 04:06 PM 0.96 0.44 - 1.00 mg/dL Final  03/05/2021 04:34 PM 0.94 0.57 - 1.00 mg/dL Final  01/30/2021 09:41 AM 0.67  0.44 - 1.00 mg/dL Final  01/29/2021 09:38 AM 0.56 0.44 - 1.00 mg/dL Final  01/28/2021 12:35 PM 0.36 (L) 0.44 - 1.00 mg/dL Final  01/28/2021 04:08 AM 0.53 0.44 - 1.00 mg/dL Final  01/27/2021 03:58 AM 0.62 0.44 - 1.00 mg/dL Final  01/26/2021 05:35 AM 0.54 0.44 - 1.00 mg/dL Final  01/25/2021 05:43 AM 0.83 0.44 - 1.00 mg/dL Final  01/24/2021 07:35 PM 0.66 0.44 - 1.00 mg/dL Final  01/13/2021 08:01 AM 0.83 0.44 - 1.00 mg/dL Final  01/12/2021 07:56 AM 0.64 0.44 - 1.00 mg/dL Final  01/09/2021 05:12 AM 0.76 0.44 - 1.00 mg/dL Final  01/08/2021 06:18 AM 1.00 0.44 - 1.00 mg/dL Final  12/18/2020 03:48 PM 0.93 0.57 - 1.00 mg/dL Final  12/08/2020 03:07 AM 0.69 0.44 - 1.00 mg/dL Final  12/07/2020  03:24 AM 0.66 0.44 - 1.00 mg/dL Final  12/06/2020 03:06 AM 0.74 0.44 - 1.00 mg/dL Final  12/05/2020 03:23 AM 0.82 0.44 - 1.00 mg/dL Final  12/04/2020 05:25 PM 1.01 (H) 0.44 - 1.00 mg/dL Final  08/03/2020 01:15 PM 0.86 0.57 - 1.00 mg/dL Final  07/03/2020 04:44 AM 0.72 0.44 - 1.00 mg/dL Final  07/02/2020 03:19 AM 0.74 0.44 - 1.00 mg/dL Final  07/01/2020 08:43 AM 0.60 0.44 - 1.00 mg/dL Final  06/30/2020 03:14 AM 0.61 0.44 - 1.00 mg/dL Final  06/29/2020 10:13 AM 0.65 0.44 - 1.00 mg/dL Final  06/28/2020 05:12 AM 0.78 0.44 - 1.00 mg/dL Final  06/27/2020 07:27 AM 0.85 0.44 - 1.00 mg/dL Final  05/31/2020 11:55 AM 0.87 0.40 - 1.20 mg/dL Final  12/05/2019 01:32 PM 0.86 0.40 - 1.20 mg/dL Final  10/03/2019 01:16 PM 0.94 0.40 - 1.20 mg/dL Final  08/29/2019 03:55 AM 0.91 0.44 - 1.00 mg/dL Final  08/28/2019 03:18 AM 0.79 0.44 - 1.00 mg/dL Final  08/27/2019 06:33 PM 0.93 0.44 - 1.00 mg/dL Final  08/26/2019 08:15 AM 0.90 0.44 - 1.00 mg/dL Final  08/25/2019 02:22 PM 0.84 0.44 - 1.00 mg/dL Final  11/14/2015 05:36 PM 1.00 0.44 - 1.00 mg/dL Final  02/16/2012 11:30 AM 0.84 0.50 - 1.10 mg/dL Final  06/18/2011 05:10 AM 0.88 0.50 - 1.10 mg/dL Final  06/10/2011 09:13 AM 0.86 0.50 - 1.10 mg/dL Final     PMHx:   Past Medical History:  Diagnosis Date   Anemia 2012   Anxiety    Bronchitis    Crohn's ileitis ([place])    Depression    GERD (gastroesophageal reflux disease)    Headache(784.0)    MIGRAINE   Heart murmur    Hiatal hernia    Hypertension    IBS (irritable bowel syndrome)    Incontinence    MVP (mitral valve prolapse)    RLS (restless legs syndrome)    Tubular adenoma of colon 08/2019    Past Surgical History:  Procedure Laterality Date   ABDOMINAL HYSTERECTOMY  06/17/2011   Procedure: HYSTERECTOMY ABDOMINAL;  Surgeon: Arloa Koh;  Location: [facility];  Service: Gynecology;  Laterality: N/A;  Converted to Abdominal Hysterectomy with lysis of adhesions    ANTERIOR AND POSTERIOR REPAIR   02/25/2012   Procedure: ANTERIOR (CYSTOCELE) AND POSTERIOR REPAIR (RECTOCELE);  Surgeon: Daria Pastures, MD;  Location: [facility];  Service: Gynecology;  Laterality: N/A;  ANterior cystocele repair ONLY   BIOPSY  08/29/2019   Procedure: BIOPSY;  Surgeon: Lavena Bullion, DO;  Location: [facility];  Service: Gastroenterology;;   BLADDER SUSPENSION  02/25/2012   Procedure: TRANSVAGINAL TAPE (TVT) PROCEDURE;  Surgeon: Daria Pastures, MD;  Location: [facility];  Service: Gynecology;  Laterality: N/A;   COLONOSCOPY WITH PROPOFOL N/A 08/29/2019   Procedure: COLONOSCOPY WITH PROPOFOL;  Surgeon: Lavena Bullion, DO;  Location: [facility];  Service: Gastroenterology;  Laterality: N/A;   CYSTOSCOPY  02/25/2012   Procedure: CYSTOSCOPY;  Surgeon: Daria Pastures, MD;  Location: [facility];  Service: Gynecology;  Laterality: N/A;   ILEOSTOMY     KNEE ARTHROSCOPY  2010   LAPAROSCOPIC ASSISTED VAGINAL HYSTERECTOMY  06/17/2011   Procedure: LAPAROSCOPIC ASSISTED VAGINAL HYSTERECTOMY;  Surgeon: Arloa Koh;  Location: [facility];  Service: Gynecology;  Laterality: N/A;  Attempted    POLYPECTOMY  08/29/2019   Procedure: POLYPECTOMY;  Surgeon: Lavena Bullion, DO;  Location: [facility];  Service: Gastroenterology;;   SALPINGOOPHORECTOMY  06/17/2011   Procedure: SALPINGO OOPHERECTOMY;  Surgeon: Arloa Koh;  Location: [facility];  Service: Gynecology;  Laterality: Bilateral;   TONSILLECTOMY      Family Hx:  Family History  Problem Relation Age of Onset   Diabetes Mother    Dementia Mother    Clotting disorder Father    Macular degeneration Father    Crohn's disease Sister    Colon cancer Neg Hx    Esophageal cancer Neg Hx    Pancreatic cancer Neg Hx    Stomach cancer Neg Hx    Liver disease Neg Hx     Social History:  reports that she has been smoking cigarettes. She has never used smokeless tobacco. She reports that she does not currently use alcohol. She reports that she does not currently use  drugs after having used the following drugs: Cocaine.  Allergies:  Allergies  Allergen Reactions   Iodine Other (See Comments)    REACTION: in eye gtts Iodine eye drops; pt has no reactions to CT contrast    Medications: Prior to Admission medications   Medication Sig Start Date End Date Taking? Authorizing Provider  esomeprazole (NEXIUM) 40 MG capsule TAKE ONE (1) CAPSULE BY MOUTH 2 TIMES DAILY Patient taking differently: Take 40 mg by mouth 2 (two) times daily before a meal. 03/05/21   Denita Lung, MD  loperamide (IMODIUM) 2 MG capsule Take 1 capsule (2 mg total) by mouth daily as needed for diarrhea or loose stools. 06/28/21   Aline August, MD  polycarbophil (FIBERCON) 625 MG tablet Take 1 tablet (625 mg total) by mouth 2 (two) times daily. Patient not taking: No sig reported 01/13/21   Rai, Vernelle Emerald, MD  pramipexole (MIRAPEX) 1 MG tablet Take 1 tablet (1 mg total) by mouth daily. 03/05/21   Denita Lung, MD  venlafaxine XR (EFFEXOR-XR) 150 MG 24 hr capsule Take 2 capsules (300 mg total) by mouth daily with breakfast. 03/05/21   Denita Lung, MD    I have reviewed the patient's current and reported prior to admission medications.  Labs:  BMP Latest Ref Rng & Units 07/10/2021 07/09/2021 07/09/2021  Glucose 70 - 99 mg/dL 100(H) 95 85  BUN 8 - 23 mg/dL 82(H) 90(H) 89(H)  Creatinine 0.44 - 1.00 mg/dL 5.09(H) 5.38(H) 5.24(H)  BUN/Creat Ratio 12 - 28 - - -  Sodium 135 - 145 mmol/L 123(L) 120(L) 120(L)  Potassium 3.5 - 5.1 mmol/L >7.5(HH) >7.5(HH) >7.5(HH)  Chloride 98 - 111 mmol/L 92(L) 85(L) 88(L)  CO2 22 - 32 mmol/L 15(L) 16(L) 14(L)  Calcium 8.9 - 10.3 mg/dL 9.7 10.0 9.6    Urinalysis    Component Value Date/Time   COLORURINE YELLOW 06/26/2021 0235  APPEARANCEUR HAZY (A) 06/26/2021 0235   LABSPEC 1.013 06/26/2021 0235   LABSPEC 1,025 12/18/2020 1644   PHURINE 5.0 06/26/2021 0235   GLUCOSEU NEGATIVE 06/26/2021 0235   HGBUR NEGATIVE 06/26/2021 0235   BILIRUBINUR NEGATIVE  06/26/2021 0235   BILIRUBINUR small (A) 12/18/2020 1644   BILIRUBINUR n 12/04/2011 [place] 06/26/2021 0235   PROTEINUR NEGATIVE 06/26/2021 0235   UROBILINOGEN negative 12/04/2011 1014   NITRITE NEGATIVE 06/26/2021 0235   LEUKOCYTESUR SMALL (A) 06/26/2021 0235     ROS:  Pertinent items noted in HPI and remainder of comprehensive ROS otherwise negative.  Physical Exam: Vitals:   07/10/21 0155 07/10/21 0205  BP: (!) 85/63 91/71  Pulse:    Resp: 15 13  Temp:    SpO2:       General: adult female in bed, shivering - repositioned blankets HEENT: NCAT Eyes: EOMI sclera anicteric Neck: supple trachea midline Heart: S1S2 tachycardic to 146 on my exam - critical care assessing now Lungs: clear to auscultation; unlabored on room air  Abdomen: ostomy in place; soft/nd Extremities: no edema appreciated; no cyanosis or clubbing Skin: no rash on extremities exposed Neuro: alert and oriented x 3 provides hx and follows commands Psych normal mood and affect  Assessment/Plan:  # Hyperkalemia - Start CRRT - 0K dialysate and 2K pre and post filter fluids to start - Spoke with critical care and appreciate them placing a line - Continue bicarb gtt and NS  - stat renal panel   # Shock - hypovolemic  - hypovolemic in part  - NS 500 mL additional now  - pressors per primary team - defer abx to primary team discretion  # AKI -prerenal as well as ischemic insults with hypotension - Fluids and pressors as above - Starting CRRT - Getting CT a/p without contrast  - Renal panel stat  # Hyponatremia  - Follow sodium - repeat bolus of NS ordered for hypotension on my exam - note previously on HCTZ before prior admit - would need to avoid use of HCTZ indefinitely   # Metabolic acidosis  - likely renal and GI losses - starting CRRT  Thank you for the consult.  Please do not hesitate to contact me with any questions.  Spoke with ER MD, pulm MD, and ED nursing   Claudia Desanctis 07/10/2021, 3:21 AM   Spoke with pulm MD and with charge RN in ICU where pt is currently located.  Her 3 am BMP demonstrates K 4.2 whereas all other labs are K > 7.5.  Repeat renal panel was ordered and they are going to obtain.  Would proceed with nontunneled dialysis catheter as MD at bedside to place and BUN still 90 with all other labs K > 7.5.    Will make decision re: CRRT per the repeat labs.  Appreciate critical care team.  CRRT orders out for now as will need to carefully assess K content of fluids   Claudia Desanctis, MD 3:54 AM 07/10/2021

## 2021-07-10 NOTE — Progress Notes (Signed)
NAME:  Cynthia [name], MRN:  [id number], DOB:  [birth year], LOS: 0 ADMISSION DATE:  07/09/2021 CONSULTATION DATE:  07/09/2021 REFERRING MD: EDP CHIEF COMPLAINT:  Nausea/vomiting, abdominal pain, hypotension   History of Present Illness:  60 year old woman who presented to [facility] ED 9/6 via EMS for abdominal pain, nausea, vomiting and diarrhea. PMHx significant for HTN, MVP, GERD, anixety/depression, IBS and Crohn's disease (c/b SBO, resulting in resection and ileostomy creation 02/2021).  Recently admitted 8/24-8/26 with similar presentation and concern for gastroenteritis. Patient reports that she did well for a couple of days after discharge but rapidly deteriorated over the two days prior to admission. She states that he PO intake and appetite has been very poor since her ostomy creation. She struggles with the ostomy itself and with keeping up with her fluid intake. She feels this has lead to her two most recent admissions.  In the ED, patient was tachycardic and hypotensive. Labs were notable for AKI with K 7.7. Nephrology was consulted with initial plan for CRRT and Trialysis catheter was placed.   PCCM consulted for ICU admission and further management.  Pertinent Medical History:   Past Medical History:  Diagnosis Date   Anemia 2012   Anxiety    Bronchitis    Crohn's ileitis ([place])    Depression    GERD (gastroesophageal reflux disease)    Headache(784.0)    MIGRAINE   Heart murmur    Hiatal hernia    Hypertension    IBS (irritable bowel syndrome)    Incontinence    MVP (mitral valve prolapse)    RLS (restless legs syndrome)    Tubular adenoma of colon 08/2019   Significant Hospital Events: Including procedures, antibiotic start and stop dates in addition to other pertinent events   9/6 - BIB EMS for abdominal pain, n/v/d and hypotension. Significant AKI vs. ARF on arrival to ED (K >7, Cr 5.38) with hypotension requiring pressor initiation. Trialysis catheter placed for ?CRRT.  Admit to ICU. Nephro consult. 9/7 - Clinically improving, persistent hypotension requiring pressors (NE 16). Cr downtrending (4.3 from peak 5.38). CT A/P to evaluate for etiologies of abdominal pain. WOC/Nutrition consults.  Interim History / Subjective:  Feeling better this morning Sitting up in bed eating breakfast Appetite is better, nausea subsided Reports reduced PO intake since ostomy creation Plan for WOC/Nutrition consults today CT A/P pending Holding on CRRT, given downtrending Cr  Objective:  Blood pressure (!) 105/55, pulse (!) 118, temperature 98.1 F (36.7 C), temperature source Oral, resp. rate 12, height 5' 4"  (1.626 m), weight 43.2 kg, last menstrual period 06/10/2011, SpO2 97 %.        Intake/Output Summary (Last 24 hours) at 07/10/2021 0917 Last data filed at 07/10/2021 0734 Gross per 24 hour  Intake 2193.92 ml  Output 10 ml  Net 2183.92 ml   Filed Weights   07/09/21 2153 07/10/21 0500  Weight: 52.2 kg 43.2 kg   Physical Examination: General: Acutely ill-appearing middle-aged woman in NAD. HEENT: [place]/AT, anicteric sclera, PERRL, dry mucous membranes. Neuro: Awake, oriented x 4. Responds to verbal stimuli. Following commands consistently. Moves all 4 extremities spontaneously.  CV: Tachycardic, regular rhythm, no m/g/r. PULM: Breathing even and unlabored on 2LNC. Lung fields diminished at bilateral bases. GI: Soft, nondistended, mild erythema and TTP over midline/around ostomy site. Stoma pink and budding, ostomy output thin, liquid and medium green. Normoactive bowel sounds. Extremities: No LE edema noted. Skin: Warm/dry, erythema to midline abdomen and around ostomy site as noted above.  Resolved Hospital Problem List:  N/A  Assessment & Plan:  Shock, likely hypovolemic in the setting of significant GI losses with ?component of infection Presented to [facility] ED via EMS for intractable nausea/vomiting with abdominal pain. Poor PO intake for several days prior to  admission. Recent admission for similar symptoms, discharged 8/26. - Admitted to ICU - Fluid resuscitation as appropriate - Goal MAP > 65 - Levophed titrated to goal MAP - LA normal - Antiemetics PRN  Concern for abdominal wall cellulitis Recent admission 8/24-8/26 for abdominal pain, n/v/d. Leukocytosis noted at that time. C/f abdominal wall cellulitis at inferior aspect of ostomy, for which patient was placed on Ancef, transitioned to Keflex at discharge (did not complete course). - CT A/P pending to assess abdomen - Received ceftriaxone x 1 dose in ED - F/u CT and clinical status to determine further antibiotic need  Crohn's disease s/p ileostomy creation History of Crohn's disease. C/b SBO 02/2021 requiring bowel resection and ileostomy creation. - Nutrition consult - WOC consult for evaluation of/assistance with ostomy  AKI Hyperkalemia Hyponatremia Hypomagnesemia - Nephrology consulted, appreciate recommendations - Trend BMP, specifically K/Cr - Medical shifting of K as tolerated; Cr downtrending so will hopefully be able to avoid dialysis - Replete electrolytes as indicated (Mg replaced today, 9/7) - Monitor I&Os - F/u urine studies - Avoid nephrotoxic agents as able - Ensure adequate renal perfusion  GERD Takes esomeprazole at home. - Continue PPI  RLS Depression Anxiety - Continue home Effexor, pramipexole  Best Practice: (right click and "Reselect all SmartList Selections" daily)   Diet/type: Regular consistency (see orders) DVT prophylaxis: prophylactic heparin  GI prophylaxis: PPI Lines: Dialysis Catheter Foley:  Yes, and it is still needed Code Status:  full code Last date of multidisciplinary goals of care discussion [Pending]  Critical care time: 38 minutes   Lestine Mount, PA-C [facility] 07/10/21 9:17 AM  Please see [email or web address] for pager details.  From 7A-7P if no response, please call [phone] After hours,  please call ELink [phone]

## 2021-07-10 NOTE — ED Notes (Signed)
Ready for transport.

## 2021-07-10 NOTE — H&P (Signed)
NAME:  Cynthia [name] MRN:  [id number] DOB:  [birth year] LOS: 0 ADMISSION DATE:  07/09/2021 DATE OF SERVICE:  07/10/2021  CHIEF COMPLAINT:  abdominal pain   HISTORY & PHYSICAL  History of Present Illness  This 60 y.o. Caucasian female smoker presented to the [facility] Emergency Department via EMS with complaints of abdominal pain, nausea, vomiting and diarrhea. The patient apparently was recently discharged on 8/26 after a hospitalization with similar presentation.  She reports that she did well for a couple of days after discharge but rapidly deteriorated over the past 2 days.  In the ER, laboratory evaluation revealed that the patient had electrolyte abnormalities and acute kidney injury.  K 7.1.  Nephrology plans on CRRT.  PCCM asked to admit  REVIEW OF SYSTEMS  Constitutional: Trembling due to anxiey.  Denies chills.  No weight loss. No night sweats. No fever.  No fatigue. HEENT: No headaches, dysphagia, sore throat, otalgia, nasal congestion, PND CV:  No chest pain, orthopnea, PND, swelling in lower extremities, palpitations GI:  Abdominal pain around ostomy site.  Nausea and vomiting have subsided.  Diarrhea.  Hungry.  Resp: No DOE, rest dyspnea, cough, mucus, hemoptysis, wheezing  GU: no dysuria, change in color of urine, no urgency or frequency.  No flank pain. MS:  No joint pain or swelling. No myalgias,  No decreased range of motion.  Psych:  No change in mood or affect. No memory loss. Skin: no rash or lesions.   Past Medical/Surgical/Social/Family History   Past Medical History:  Diagnosis Date   Anemia 2012   Anxiety    Bronchitis    Crohn's ileitis ([place])    Depression    GERD (gastroesophageal reflux disease)    Headache(784.0)    MIGRAINE   Heart murmur    Hiatal hernia    Hypertension    IBS (irritable bowel syndrome)    Incontinence    MVP (mitral valve prolapse)    RLS (restless legs syndrome)    Tubular adenoma of colon 08/2019     Past Surgical History:  Procedure Laterality Date   ABDOMINAL HYSTERECTOMY  06/17/2011   Procedure: HYSTERECTOMY ABDOMINAL;  Surgeon: Arloa Koh;  Location: [facility];  Service: Gynecology;  Laterality: N/A;  Converted to Abdominal Hysterectomy with lysis of adhesions    ANTERIOR AND POSTERIOR REPAIR  02/25/2012   Procedure: ANTERIOR (CYSTOCELE) AND POSTERIOR REPAIR (RECTOCELE);  Surgeon: Daria Pastures, MD;  Location: [facility];  Service: Gynecology;  Laterality: N/A;  ANterior cystocele repair ONLY   BIOPSY  08/29/2019   Procedure: BIOPSY;  Surgeon: Lavena Bullion, DO;  Location: [facility];  Service: Gastroenterology;;   BLADDER SUSPENSION  02/25/2012   Procedure: TRANSVAGINAL TAPE (TVT) PROCEDURE;  Surgeon: Daria Pastures, MD;  Location: [facility];  Service: Gynecology;  Laterality: N/A;   COLONOSCOPY WITH PROPOFOL N/A 08/29/2019   Procedure: COLONOSCOPY WITH PROPOFOL;  Surgeon: Lavena Bullion, DO;  Location: [place];  Service: Gastroenterology;  Laterality: N/A;   CYSTOSCOPY  02/25/2012   Procedure: CYSTOSCOPY;  Surgeon: Daria Pastures, MD;  Location: [facility];  Service: Gynecology;  Laterality: N/A;   ILEOSTOMY     KNEE ARTHROSCOPY  2010   LAPAROSCOPIC ASSISTED VAGINAL HYSTERECTOMY  06/17/2011   Procedure: LAPAROSCOPIC ASSISTED VAGINAL HYSTERECTOMY;  Surgeon: Arloa Koh;  Location: [facility];  Service: Gynecology;  Laterality: N/A;  Attempted    POLYPECTOMY  08/29/2019   Procedure: POLYPECTOMY;  Surgeon: Lavena Bullion, DO;  Location: [facility];  Service: Gastroenterology;;   SALPINGOOPHORECTOMY  06/17/2011   Procedure: SALPINGO OOPHERECTOMY;  Surgeon: Arloa Koh;  Location: [facility];  Service: Gynecology;  Laterality: Bilateral;   TONSILLECTOMY      Social History   Tobacco Use   Smoking status: Some Days    Packs/day: 0.00    Types: Cigarettes   Smokeless tobacco: Never   Tobacco comments:    none in 2 weeks as of 12/04/20  Substance Use Topics    Alcohol use: Not Currently    Family History  Problem Relation Age of Onset   Diabetes Mother    Dementia Mother    Clotting disorder Father    Macular degeneration Father    Crohn's disease Sister    Colon cancer Neg Hx    Esophageal cancer Neg Hx    Pancreatic cancer Neg Hx    Stomach cancer Neg Hx    Liver disease Neg Hx      Procedures:     Significant Diagnostic Tests:     Micro Data:   Results for orders placed or performed during the hospital encounter of 07/09/21  Resp Panel by RT-PCR (Flu A&B, Covid) Nasopharyngeal Swab     Status: None   Collection Time: 07/09/21 11:22 PM   Specimen: Nasopharyngeal Swab; Nasopharyngeal(NP) swabs in vial transport medium  Result Value Ref Range Status   SARS Coronavirus 2 by RT PCR NEGATIVE NEGATIVE Final    Comment: (NOTE) SARS-CoV-2 target nucleic acids are NOT DETECTED.  The SARS-CoV-2 RNA is generally detectable in upper respiratory specimens during the acute phase of infection. The lowest concentration of SARS-CoV-2 viral copies this assay can detect is 138 copies/mL. A negative result does not preclude SARS-Cov-2 infection and should not be used as the sole basis for treatment or other patient management decisions. A negative result may occur with  improper specimen collection/handling, submission of specimen other than nasopharyngeal swab, presence of viral mutation(s) within the areas targeted by this assay, and inadequate number of viral copies(<138 copies/mL). A negative result must be combined with clinical observations, patient history, and epidemiological information. The expected result is Negative.  Fact Sheet for Patients:  EntrepreneurPulse.com.au  Fact Sheet for Healthcare Providers:  IncredibleEmployment.be  This test is no t yet approved or cleared by the [place] FDA and  has been authorized for detection and/or diagnosis of SARS-CoV-2 by FDA under an  Emergency Use Authorization (EUA). This EUA will remain  in effect (meaning this test can be used) for the duration of the COVID-19 declaration under Section 564(b)(1) of the Act, 21 U.S.C.section 360bbb-3(b)(1), unless the authorization is terminated  or revoked sooner.       Influenza A by PCR NEGATIVE NEGATIVE Final   Influenza B by PCR NEGATIVE NEGATIVE Final    Comment: (NOTE) The Xpert Xpress SARS-CoV-2/FLU/RSV plus assay is intended as an aid in the diagnosis of influenza from Nasopharyngeal swab specimens and should not be used as a sole basis for treatment. Nasal washings and aspirates are unacceptable for Xpert Xpress SARS-CoV-2/FLU/RSV testing.  Fact Sheet for Patients: EntrepreneurPulse.com.au  Fact Sheet for Healthcare Providers: IncredibleEmployment.be  This test is not yet approved or cleared by the [place] FDA and has been authorized for detection and/or diagnosis of SARS-CoV-2 by FDA under an Emergency Use Authorization (EUA). This EUA will remain in effect (meaning this test can be used) for the duration of the COVID-19 declaration under Section 564(b)(1) of the Act, 21 U.S.C. section 360bbb-3(b)(1),  unless the authorization is terminated or revoked.  Performed at [facility], [place], [place] [address] [place], [place] [address]       Antimicrobials:  Rocephin (9/6>>)    Interim history/subjective:     Objective   BP 91/71   Pulse (!) 136   Temp 98 F (36.7 C) (Oral)   Resp 13   Ht 5' 4"  (1.626 m)   Wt 52.2 kg   LMP 06/10/2011 (Exact Date)   SpO2 100%   BMI 19.74 kg/m     Filed Weights   07/09/21 2153  Weight: 52.2 kg    Intake/Output Summary (Last 24 hours) at 07/10/2021 0221 Last data filed at 07/10/2021 0151 Gross per 24 hour  Intake 600.07 ml  Output --  Net 600.07 ml        Examination: GENERAL:  alert, oriented to time, person and place, pleasant, cachectic. Anxious. HEAD:  normocephalic, atraumatic EYE: PERRLA, EOM intact, no scleral icterus, no pallor. THROAT/ORAL CAVITY: Normal dentition. No oral thrush. No exudate. Mucous membranes are dry. No tonsillar enlargement.  NECK: supple, no thyromegaly, no JVD, no lymphadenopathy. Trachea midline. CHEST/LUNG: symmetric in development and expansion. Good air entry. No crackles. No wheezes. HEART: Regular S1 and S2 without murmur, rub or gallop. ABDOMEN: tenderness about the inferior margin of the ostomy site.  Soft, nondistended.  Normoactive bowel sounds. No rebound. No guarding. No hepatosplenomegaly. EXTREMITIES: Edema: none. No cyanosis. No clubbing. 2+ DP pulses LYMPHATIC: no cervical/axillary/inguinal lymph nodes appreciated MUSCULOSKELETAL: No point tenderness. No bulk atrophy. Joints: normal inspection.  SKIN:  Mild induration around the ostomy site. NEUROLOGIC: Doll's eyes intact. Corneal reflex intact. Spontaneous respirations intact. Cranial nerves II-XII are grossly symmetric and physiologic. Babinski absent. No sensory deficit. Motor: 5/5 @ RUE, 5/5 @ LUE, 5/5 @ RLL,  5/5 @ LLL.  DTR: 2+ @ R biceps, 2+ @ L biceps, 2+ @ R patellar,  2+ @ L patellar. No cerebellar signs. Gait was not assessed.   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   Acute renal failure, unspecified acute renal failure type ([place])   By systems: PULMONARY: No acute issues Supplemental oxygen as needed to maintain SpO2 93+%   CARDIOVASCULAR Hypotension with normal lactate level Tachycardia Continue IV fluids Check random cortisol   RENAL Acute  kidney injury Hyperkalemia Hyponatremia Continue temporizing measures Will plan to place HD catheter for CRRT Nephrology input appreciated   GASTROINTESTINAL Crohn's diseaes GERD GI PROPHYLAXIS: Protonix   HEMATOLOGIC Leukocytosis, unknown etiology DVT PROPHYLAXIS: heparin   INFECTIOUS Possible cellulitis of the abdominal wall Check blood and urine cultures Continue Rocephin for now  ENDOCRINE: No acute issues   NEUROLOGIC Situational anxiety   PLAN/RECOMMENDATIONS  Admit to ICU under my service (Attending: Renee Pain, MD) with the diagnoses highlighted above in the active Hospital Problem List (ASSESSMENT). See above    My assessment, plan of care, findings, medications, side effects, etc. were discussed with: nurse and patient (answered all questions to patient's satisfaction).   Best practice:  Diet: regular Pain/Anxiety/Delirium protocol (if indicated): N/A VAP protocol (if indicated): N/A DVT prophylaxis: heparin  GI prophylaxis: Protonix Glucose control: N/A Mobility/Activity: bedrest for now   Code Status: Full Code Family Communication:   no family at bedside Disposition: admit to ICU   Labs   CBC: Recent Labs  Lab 07/09/21 2341  WBC 11.4*  NEUTROABS 9.2*  HGB 14.9  HCT 43.6  MCV 85.8  PLT 517*    Basic Metabolic Panel: Recent Labs  Lab 07/09/21 2200 07/09/21 2341 07/10/21 0106  NA 120* 120* 123*  K >7.5* >7.5* >7.5*  CL 88* 85* 92*  CO2 14* 16* 15*  GLUCOSE 85 95 100*  BUN 89* 90* 82*  CREATININE 5.24* 5.38* 5.09*  CALCIUM 9.6 10.0 9.7   GFR: Estimated Creatinine Clearance: 9.6 mL/min (A) (by C-G formula based on SCr of 5.09 mg/dL (H)). Recent Labs  Lab 07/09/21 2319 07/09/21 2341  WBC  --  11.4*  LATICACIDVEN 1.8  --     Liver Function Tests: Recent Labs  Lab 07/09/21 2200 07/10/21 0106  AST 26 22  ALT 25 19  ALKPHOS 75 64  BILITOT 0.7 0.7  PROT 8.5* 7.8  ALBUMIN 4.4 4.0   Recent Labs  Lab 07/09/21 2200   LIPASE 116*   No results for input(s): AMMONIA in the last 168 hours.  ABG    Component Value Date/Time   HCO3 15.1 (L) 07/10/2021 0015   ACIDBASEDEF 11.4 (H) 07/10/2021 0015   O2SAT 27.3 07/10/2021 0015     Coagulation Profile: No results for input(s): INR, PROTIME in the last 168 hours.  Cardiac Enzymes: No results for input(s): CKTOTAL, CKMB, CKMBINDEX, TROPONINI in the last 168 hours.  HbA1C: Hgb A1c MFr Bld  Date/Time Value Ref Range Status  01/28/2021 04:08 AM 5.3 4.8 - 5.6 % Final    Comment:    (NOTE) Pre diabetes:          5.7%-6.4%  Diabetes:              >6.4%  Glycemic control for   <7.0% adults with diabetes   11/24/2013 10:14 AM 6.3 (H) <5.7 % Final    Comment:                                                                           According to the ADA Clinical Practice Recommendations for 2011, when HbA1c is used as a screening test:     >=6.5%   Diagnostic of Diabetes Mellitus            (if abnormal result is confirmed)   5.7-6.4%   Increased risk of developing Diabetes Mellitus   References:Diagnosis and Classification of Diabetes Mellitus,Diabetes KGMW,1027,25(DGUYQ 1):S62-S69 and Standards of Medical Care in         Diabetes - 2011,Diabetes Care,2011,34 (Suppl 1):S11-S61.      CBG: Recent Labs  Lab 07/10/21 0034  GLUCAP 94     Past Medical History   Past Medical History:  Diagnosis Date   Anemia 2012   Anxiety    Bronchitis    Crohn's ileitis ([place])    Depression    GERD (gastroesophageal reflux disease)    Headache(784.0)    MIGRAINE   Heart murmur    Hiatal hernia    Hypertension    IBS (  irritable bowel syndrome)    Incontinence    MVP (mitral valve prolapse)    RLS (restless legs syndrome)    Tubular adenoma of colon 08/2019      Surgical History    Past Surgical History:  Procedure Laterality Date   ABDOMINAL HYSTERECTOMY  06/17/2011   Procedure: HYSTERECTOMY ABDOMINAL;  Surgeon: Arloa Koh;  Location: [facility];  Service: Gynecology;  Laterality: N/A;  Converted to Abdominal Hysterectomy with lysis of adhesions    ANTERIOR AND POSTERIOR REPAIR  02/25/2012   Procedure: ANTERIOR (CYSTOCELE) AND POSTERIOR REPAIR (RECTOCELE);  Surgeon: Daria Pastures, MD;  Location: [facility];  Service: Gynecology;  Laterality: N/A;  ANterior cystocele repair ONLY   BIOPSY  08/29/2019   Procedure: BIOPSY;  Surgeon: Lavena Bullion, DO;  Location: [facility];  Service: Gastroenterology;;   BLADDER SUSPENSION  02/25/2012   Procedure: TRANSVAGINAL TAPE (TVT) PROCEDURE;  Surgeon: Daria Pastures, MD;  Location: [facility];  Service: Gynecology;  Laterality: N/A;   COLONOSCOPY WITH PROPOFOL N/A 08/29/2019   Procedure: COLONOSCOPY WITH PROPOFOL;  Surgeon: Lavena Bullion, DO;  Location: [facility];  Service: Gastroenterology;  Laterality: N/A;   CYSTOSCOPY  02/25/2012   Procedure: CYSTOSCOPY;  Surgeon: Daria Pastures, MD;  Location: [facility];  Service: Gynecology;  Laterality: N/A;   ILEOSTOMY     KNEE ARTHROSCOPY  2010   LAPAROSCOPIC ASSISTED VAGINAL HYSTERECTOMY  06/17/2011   Procedure: LAPAROSCOPIC ASSISTED VAGINAL HYSTERECTOMY;  Surgeon: Arloa Koh;  Location: [facility];  Service: Gynecology;  Laterality: N/A;  Attempted    POLYPECTOMY  08/29/2019   Procedure: POLYPECTOMY;  Surgeon: Lavena Bullion, DO;  Location: [facility];  Service: Gastroenterology;;   SALPINGOOPHORECTOMY  06/17/2011   Procedure: SALPINGO OOPHERECTOMY;  Surgeon: Arloa Koh;  Location: [facility];  Service: Gynecology;  Laterality: Bilateral;   TONSILLECTOMY        Social History   Social History   Socioeconomic History   Marital status: Widowed    Spouse name: Not on file   Number of children: 1   Years of education: Not on file   Highest education level: Not on file  Occupational History   Occupation: Designer, television/film set: [employer]  Tobacco Use   Smoking status: Some Days    Packs/day: 0.00    Types: Cigarettes    Smokeless tobacco: Never   Tobacco comments:    none in 2 weeks as of 12/04/20  Vaping Use   Vaping Use: Former  Substance and Sexual Activity   Alcohol use: Not Currently   Drug use: Not Currently    Types: Cocaine    Comment: clean x 8 weeks   Sexual activity: Not Currently  Other Topics Concern   Not on file  Social History Narrative   Lives with boyfriend who she states is an alcoholic   Social Determinants of Radio broadcast assistant Strain: Not on file  Food Insecurity: Not on file  Transportation Needs: Not on file  Physical Activity: Not on file  Stress: Not on file  Social Connections: Not on file      Family History    Family History  Problem Relation Age of Onset   Diabetes Mother    Dementia Mother    Clotting disorder Father    Macular degeneration Father    Crohn's disease Sister    Colon cancer Neg Hx    Esophageal cancer Neg Hx    Pancreatic cancer  Neg Hx    Stomach cancer Neg Hx    Liver disease Neg Hx    family history includes Clotting disorder in her father; Crohn's disease in her sister; Dementia in her mother; Diabetes in her mother; Macular degeneration in her father. There is no history of Colon cancer, Esophageal cancer, Pancreatic cancer, Stomach cancer, or Liver disease.    Allergies Allergies  Allergen Reactions   Iodine Other (See Comments)    REACTION: in eye gtts Iodine eye drops; pt has no reactions to CT contrast      Current Medications  Current Facility-Administered Medications:    norepinephrine (LEVOPHED) 61m in 2568mpremix infusion, 0-40 mcg/min, Intravenous, Continuous, Cardama, PeGrayce SessionsMD, Last Rate: 30 mL/hr at 07/10/21 0201, 8 mcg/min at 07/10/21 0201   sodium bicarbonate 150 mEq in dextrose 5 % 1,150 mL infusion, , Intravenous, Once, Cardama, PeGrayce SessionsMD, Last Rate: 100 mL/hr at 07/10/21 0012, New Bag at 07/10/21 0012  Current Outpatient Medications:    esomeprazole (NEXIUM) 40 MG capsule, TAKE ONE  (1) CAPSULE BY MOUTH 2 TIMES DAILY (Patient taking differently: Take 40 mg by mouth 2 (two) times daily before a meal.), Disp: 180 capsule, Rfl: 3   loperamide (IMODIUM) 2 MG capsule, Take 1 capsule (2 mg total) by mouth daily as needed for diarrhea or loose stools., Disp: 30 capsule, Rfl: 0   polycarbophil (FIBERCON) 625 MG tablet, Take 1 tablet (625 mg total) by mouth 2 (two) times daily. (Patient not taking: No sig reported), Disp: 120 tablet, Rfl: 2   pramipexole (MIRAPEX) 1 MG tablet, Take 1 tablet (1 mg total) by mouth daily., Disp: 90 tablet, Rfl: 3   venlafaxine XR (EFFEXOR-XR) 150 MG 24 hr capsule, Take 2 capsules (300 mg total) by mouth daily with breakfast., Disp: 90 capsule, Rfl: 3   Home Medications  Prior to Admission medications   Medication Sig Start Date End Date Taking? Authorizing Provider  esomeprazole (NEXIUM) 40 MG capsule TAKE ONE (1) CAPSULE BY MOUTH 2 TIMES DAILY Patient taking differently: Take 40 mg by mouth 2 (two) times daily before a meal. 03/05/21   LaDenita LungMD  loperamide (IMODIUM) 2 MG capsule Take 1 capsule (2 mg total) by mouth daily as needed for diarrhea or loose stools. 06/28/21   AlAline AugustMD  polycarbophil (FIBERCON) 625 MG tablet Take 1 tablet (625 mg total) by mouth 2 (two) times daily. Patient not taking: No sig reported 01/13/21   Rai, RiVernelle EmeraldMD  pramipexole (MIRAPEX) 1 MG tablet Take 1 tablet (1 mg total) by mouth daily. 03/05/21   LaDenita LungMD  venlafaxine XR (EFFEXOR-XR) 150 MG 24 hr capsule Take 2 capsules (300 mg total) by mouth daily with breakfast. 03/05/21   LaDenita LungMD     Critical care time: 45 minutes.  The treatment and management of the patient's condition was required based on the threat of imminent deterioration. This time reflects time spent by the physician evaluating, providing care and managing the critically ill patient's care. The time was spent at the immediate bedside (or on the same floor/unit and  dedicated to this patient's care). Time involved in separately billable procedures is NOT included int he critical care time indicated above. Family meeting and update time may be included above if and only if the patient is unable/incompetent to participate in clinical interview and/or decision making, and the discussion was necessary to determining treatment decisions.   SeRenee PainMD Board Certified by  the ABIM, Pulmonary Diseases & Critical Care Medicine

## 2021-07-10 NOTE — ED Notes (Signed)
Neb is complete

## 2021-07-10 NOTE — Procedures (Signed)
Central Venous Catheter Insertion Procedure Note  Cynthia [name]  [id number]  Mar 21, 1961  Date:07/10/21  Time:4:45 AM   Provider Performing:Seong-Joo Carson Myrtle   Procedure: Insertion of Non-tunneled Central Venous [id number]) with US guidance (67011)   Indication(s) Medication administration and Hemodialysis  Consent Risks of the procedure as well as the alternatives and risks of each were explained to the patient and/or caregiver.  Consent for the procedure was obtained and is signed in the bedside chart  Anesthesia Topical only with 1% lidocaine   Timeout Verified patient identification, verified procedure, site/side was marked, verified correct patient position, special equipment/implants available, medications/allergies/relevant history reviewed, required imaging and test results available.  Sterile Technique Maximal sterile technique including full sterile barrier drape, hand hygiene, sterile gown, sterile gloves, mask, hair covering, sterile ultrasound probe cover (if used).  Procedure Description Area of catheter insertion was cleaned with chlorhexidine and draped in sterile fashion.  With real-time ultrasound guidance a HD catheter was placed into the right subclavian vein. Nonpulsatile blood flow and easy flushing noted in all ports.  The catheter was sutured in place and sterile dressing applied.  Complications/Tolerance None; patient tolerated the procedure well. Chest X-ray is ordered to verify placement.  EBL Minimal  Specimen(s) None  Renee Pain, MD Board Certified by the ABIM, [place]

## 2021-07-10 NOTE — Progress Notes (Signed)
[facility] Progress Note  Subjective: seen in room, still feeling very weak and tired, and abd pain/ ostomy pain.  BP's up/ better today, UOP minimal recorded UOP 10 cc. Creat down 4.37, Na up 127, K 4.1, 4.2.    Vitals:   07/10/21 0630 07/10/21 0640 07/10/21 0650 07/10/21 0700  BP: 105/73 114/68 104/62 (!) 105/55  Pulse: (!) 117 (!) 117 (!) 118 (!) 118  Resp: 15 11 13 12   Temp:      TempSrc:      SpO2: 97% 98% 100% 97%  Weight:      Height:        Exam:  alert, nad   no jvd. Poor skin turgor  Chest cta bilat  Cor reg no RG  Abd soft ntnd no ascites, RLQ ostomy  GU foley in place   Ext no LE edema   Alert, NF, ox3      UA 9/6 - negative   UNa, UCr - pend    Renal US - pend    CXR = no acute disease      Date   Creat   eGFR    2012- 2021  0.79- 1.01    Feb - may 2022 0.54- 0.96  > 60 ml/min    June   1.28 >> 0.82  AKI, 48 - >60    Aug 2022  3.06 >> 1.49  AKI, 17 - 40    Sept 6  5.09   9    Sept 7, 2022  4.37   11  Assessment/ Plan: AKI - b/l creat 0.5- 0.9 from early 2022, eGFR > 60.  Recurrent AKI in June and Aug, worsening each time, likely due to worsening Crohn's/ recurrent vol depletion. Creat down today, still looks dry, will rebolus and ^IVF"s. No need for RRT. Keep temp cath in for now. Will follow.  Hyperkalemia - resolved w/ temporizing measures Hyponatremia - hypovolemic, as above Vol depletion - as above Hypotension - d/t vol depletion, doesn't appear septic Crohns disease - per pmd / GI     Cynthia [name] Standre 07/10/2021, 9:21 AM   Recent Labs  Lab 07/10/21 0300 07/10/21 0443 07/10/21 0550  K 4.2 4.1  --   BUN 90* 70*  --   CREATININE 4.90* 4.37*  --   CALCIUM 8.8* 8.5*  --   PHOS  --  6.6*  --   HGB 10.5*  --  10.1*   Inpatient medications:  Chlorhexidine Gluconate Cloth  6 each Topical Daily   heparin  5,000 Units Subcutaneous Q8H   iohexol  500 mL Oral Q1H   pantoprazole (PROTONIX) IV  40 mg Intravenous QHS    sodium  chloride 100 mL/hr at 07/10/21 0734   magnesium sulfate bolus IVPB 4 g (07/10/21 0855)   norepinephrine (LEVOPHED) Adult infusion 16 mcg/min (07/10/21 0903)   acetaminophen, docusate sodium, heparin, polyethylene glycol

## 2021-07-11 DIAGNOSIS — R933 Abnormal findings on diagnostic imaging of other parts of digestive tract: Secondary | ICD-10-CM

## 2021-07-11 DIAGNOSIS — N179 Acute kidney failure, unspecified: Secondary | ICD-10-CM | POA: Diagnosis not present

## 2021-07-11 DIAGNOSIS — R579 Shock, unspecified: Secondary | ICD-10-CM

## 2021-07-11 DIAGNOSIS — E43 Unspecified severe protein-calorie malnutrition: Secondary | ICD-10-CM | POA: Insufficient documentation

## 2021-07-11 LAB — CBC
HCT: 22.6 % — ABNORMAL LOW (ref 36.0–46.0)
Hemoglobin: 7.8 g/dL — ABNORMAL LOW (ref 12.0–15.0)
MCH: 29.7 pg (ref 26.0–34.0)
MCHC: 34.5 g/dL (ref 30.0–36.0)
MCV: 85.9 fL (ref 80.0–100.0)
Platelets: 251 10*3/uL (ref 150–400)
RBC: 2.63 MIL/uL — ABNORMAL LOW (ref 3.87–5.11)
RDW: 12.5 % (ref 11.5–15.5)
WBC: 5.1 10*3/uL (ref 4.0–10.5)
nRBC: 0 % (ref 0.0–0.2)

## 2021-07-11 LAB — BASIC METABOLIC PANEL
Anion gap: 7 (ref 5–15)
BUN: 48 mg/dL — ABNORMAL HIGH (ref 8–23)
CO2: 19 mmol/L — ABNORMAL LOW (ref 22–32)
Calcium: 7.6 mg/dL — ABNORMAL LOW (ref 8.9–10.3)
Chloride: 103 mmol/L (ref 98–111)
Creatinine, Ser: 2.33 mg/dL — ABNORMAL HIGH (ref 0.44–1.00)
GFR, Estimated: 23 mL/min — ABNORMAL LOW (ref >60)
Glucose, Bld: 76 mg/dL (ref 70–99)
Potassium: 4 mmol/L (ref 3.5–5.1)
Sodium: 129 mmol/L — ABNORMAL LOW (ref 135–145)

## 2021-07-11 LAB — URINE CULTURE: Culture: NO GROWTH

## 2021-07-11 LAB — MAGNESIUM: Magnesium: 2.3 mg/dL (ref 1.7–2.4)

## 2021-07-11 LAB — PROCALCITONIN: Procalcitonin: <0.1 ng/mL

## 2021-07-11 LAB — PHOSPHORUS: Phosphorus: 4.1 mg/dL (ref 2.5–4.6)

## 2021-07-11 LAB — CORTISOL: Cortisol, Plasma: 13.8 ug/dL

## 2021-07-11 MED ORDER — LOPERAMIDE HCL 2 MG PO CAPS
2.0000 mg | ORAL_CAPSULE | Freq: Two times a day (BID) | ORAL | Status: DC
  Administered 2021-07-11 – 2021-07-12 (×3): 2 mg via ORAL
  Filled 2021-07-11 (×3): qty 1

## 2021-07-11 MED ORDER — MELATONIN 3 MG PO TABS
3.0000 mg | ORAL_TABLET | Freq: Every evening | ORAL | Status: DC | PRN
Start: 2021-07-11 — End: 2021-07-14
  Administered 2021-07-11 – 2021-07-12 (×2): 3 mg via ORAL
  Filled 2021-07-11 (×2): qty 1

## 2021-07-11 MED ORDER — LACTATED RINGERS IV BOLUS
1000.0000 mL | Freq: Once | INTRAVENOUS | Status: AC
  Administered 2021-07-11: 1000 mL via INTRAVENOUS

## 2021-07-11 MED ORDER — LACTATED RINGERS BOLUS PEDS
250.0000 mL | Freq: Once | INTRAVENOUS | Status: AC
  Administered 2021-07-11: 250 mL via INTRAVENOUS

## 2021-07-11 MED ORDER — MELATONIN 3 MG PO TABS: 3.0000 mg | ORAL_TABLET | Freq: Every day | ORAL | Status: DC

## 2021-07-11 MED ORDER — CEFAZOLIN SODIUM-DEXTROSE 2-4 GM/100ML-% IV SOLN
2.0000 g | Freq: Two times a day (BID) | INTRAVENOUS | Status: DC
  Administered 2021-07-11 – 2021-07-12 (×2): 2 g via INTRAVENOUS
  Filled 2021-07-11 (×2): qty 100

## 2021-07-11 MED ORDER — CALCIUM POLYCARBOPHIL 625 MG PO TABS
625.0000 mg | ORAL_TABLET | Freq: Three times a day (TID) | ORAL | Status: DC
  Administered 2021-07-11 – 2021-07-14 (×9): 625 mg via ORAL
  Filled 2021-07-11 (×11): qty 1

## 2021-07-11 MED ORDER — ENSURE ENLIVE PO LIQD
237.0000 mL | Freq: Three times a day (TID) | ORAL | Status: DC
  Administered 2021-07-11 – 2021-07-14 (×7): 237 mL via ORAL

## 2021-07-11 MED ORDER — ADULT MULTIVITAMIN W/MINERALS CH
1.0000 | ORAL_TABLET | Freq: Every day | ORAL | Status: DC
  Administered 2021-07-11 – 2021-07-14 (×4): 1 via ORAL
  Filled 2021-07-11 (×4): qty 1

## 2021-07-11 NOTE — Progress Notes (Signed)
NAME:  Cynthia [name], MRN:  [id number], DOB:  May 22, 1961, LOS: 1 ADMISSION DATE:  07/09/2021 CONSULTATION DATE:  07/09/2021 REFERRING MD: EDP CHIEF COMPLAINT:  Nausea/vomiting, abdominal pain, hypotension   History of Present Illness:  60 year old woman who presented to [facility] ED 9/6 via EMS for abdominal pain, nausea, vomiting and diarrhea. PMHx significant for HTN, MVP, GERD, anixety/depression, IBS and Crohn's disease (c/b SBO, resulting in resection and ileostomy creation 02/2021).  Recently admitted 8/24-8/26 with similar presentation and concern for gastroenteritis. Patient reports that she did well for a couple of days after discharge but rapidly deteriorated over the two days prior to admission. She states that he PO intake and appetite has been very poor since her ostomy creation. She struggles with the ostomy itself and with keeping up with her fluid intake. She feels this has lead to her two most recent admissions.  In the ED, patient was tachycardic and hypotensive. Labs were notable for AKI with K 7.7. Nephrology was consulted with initial plan for CRRT and Trialysis catheter was placed.   PCCM consulted for ICU admission and further management.  Pertinent Medical History:   Past Medical History:  Diagnosis Date   Anemia 2012   Anxiety    Bronchitis    Crohn's ileitis ([place])    Depression    GERD (gastroesophageal reflux disease)    Headache(784.0)    MIGRAINE   Heart murmur    Hiatal hernia    Hypertension    IBS (irritable bowel syndrome)    Incontinence    MVP (mitral valve prolapse)    RLS (restless legs syndrome)    Tubular adenoma of colon 08/2019   Significant Hospital Events: Including procedures, antibiotic start and stop dates in addition to other pertinent events   9/6 - BIB EMS for abdominal pain, n/v/d and hypotension. Significant AKI vs. ARF on arrival to ED (K >7, Cr 5.38) with hypotension requiring pressor initiation. Trialysis catheter placed for ?CRRT.  Admit to ICU. Nephro consult. 9/7 - Clinically improving, persistent hypotension requiring pressors (NE 16). Cr downtrending (4.3 from peak 5.38). CT A/P to evaluate for etiologies of abdominal pain. WOC/Nutrition consults. 9/8 - Continues to improve, feeling better. NE weaned to off this AM. Cr 2.3 (~4.3). Per Nephro, can remove RIJ HD cath. PT/OT consults.  Interim History / Subjective:  Feeling much better Noticeably brighter spirits today No further nausea, appetite significantly improved Eating breakfast on exam Per Neprho, no indication for RRT - can likely remove HD cath Appreciate WOC consult Nutrition and GI consults today PT/OT requested  Objective:  Blood pressure 97/64, pulse 88, temperature (!) 97.3 F (36.3 C), resp. rate 10, height 5' 4"  (1.626 m), weight 49.4 kg, last menstrual period 06/10/2011, SpO2 98 %.        Intake/Output Summary (Last 24 hours) at 07/11/2021 1006 Last data filed at 07/11/2021 7858 Gross per 24 hour  Intake 4453.66 ml  Output 2895 ml  Net 1558.66 ml    Filed Weights   07/10/21 0500 07/10/21 0937 07/11/21 0400  Weight: 43.2 kg 43.2 kg 49.4 kg   Physical Examination: General: Chronically ill-appearing middle-aged woman in NAD. HEENT: [place]/AT, anicteric sclera, PERRL, moist mucous membranes. Neuro: Awake, oriented x 4. Responds to verbal stimuli. Following commands consistently. Moves all 4 extremities spontaneously.  CV: RRR, no m/g/r. PULM: Breathing even and unlabored on 2LNC. Lung fields CTAB. GI: Soft, mildly TTP around ostomy site, nondistended. Normoactive bowel sounds. Stoma pink and budding with thin brown-green output.  Mild erythema around stoma. Extremities: No LE edema noted. Skin: Warm/dry, no rashes. Mild erythema surrounding stoma as above.  Resolved Hospital Problem List:  Hyperkalemia Hypomagnesemia  Assessment & Plan:  Shock, likely hypovolemic in the setting of significant GI losses with ?component of infection Presented  to [facility] ED via EMS for intractable nausea/vomiting with abdominal pain. Poor PO intake for several days prior to admission. Recent admission for similar symptoms, discharged 8/26. - Much improved s/p aggressive fluid resuscitation - Goal MAP > 65 - Levophed weaned to off today, 9/8 - LA normal - Antiemetics PRN, nausea has subsided  Concern for abdominal wall cellulitis Recent admission 8/24-8/26 for abdominal pain, n/v/d. Leukocytosis noted at that time. C/f abdominal wall cellulitis at inferior aspect of ostomy, for which patient was placed on Ancef, transitioned to Keflex at discharge (did not complete course). - CT A/P without c/f abdominal wall cellulitis; intussusception noted (see report), unclear if this is transient - Continuing ceftriaxone at present  Crohn's disease s/p ileostomy creation History of Crohn's disease. C/b SBO 02/2021 requiring bowel resection and ileostomy creation. Patient of Dr. Fuller Plan ([facility]). - Appreciate WOC consult for evaluation.management of ostomy - Nutrition consulted for recommendations - GI consulted for recommendations on Crohn's management/high output ostomy  AKI Hyponatremia - Electrolytes normalized, UOP improved, Cr downtrending - Nephrology feels no need for RRT given clinical improvement, signed off - Can remove R IJ HD cath today, 9/8 - Trend BMP - Replete electrolytes as indicated - Monitor I&Os - Avoid nephrotoxic agents as able - Ensure adequate renal perfusion  GERD Takes esomeprazole at home. - Continue PPI  RLS Depression Anxiety Cocaine abuse - Continue home Effexor, pramipexole - UDS positive 9/8 for cocaine, per chart review has had issues with cocaine use and concomitant depression in the past - Consider Psych consult  Physical deconditioning - PT/OT consults  Best Practice: (right click and "Reselect all SmartList Selections" daily)   Diet/type: Regular consistency (see orders) DVT prophylaxis: prophylactic  heparin  GI prophylaxis: PPI Lines: Dialysis Catheter - likely to be removed today 9/8 Foley:  Yes, and it is still needed Code Status:  full code Last date of multidisciplinary goals of care discussion [Pending]  Critical care time: 30 minutes   Lestine Mount, PA-C [facility] 07/11/21 10:06 AM  Please see [email or web address] for pager details.  From 7A-7P if no response, please call [phone] After hours, please call ELink [phone]

## 2021-07-11 NOTE — Progress Notes (Addendum)
Initial Nutrition Assessment  DOCUMENTATION CODES:   Severe malnutrition in context of chronic illness  INTERVENTION:  - liberalize diet from Renal to Regular (cleared with CCM) - will order Ensure Plus TID, each supplement provides 350 kcal and 13 grams of protein. - will provide Ensure coupons for home use. - education/planning for changes at home to make nutrition intake more feasible.    NUTRITION DIAGNOSIS:   Severe Malnutrition related to chronic illness (Crohn's and IBS resulting in SBR and ileostomy with persistent high output) as evidenced by severe fat depletion, severe muscle depletion.  GOAL:   Patient will meet greater than or equal to 90% of their needs  MONITOR:   PO intake, Supplement acceptance, Labs, Weight trends, I & O's  REASON FOR ASSESSMENT:   Malnutrition Screening Tool, Consult Assessment of nutrition requirement/status  ASSESSMENT:   60 y.o. female with medical history of HTN, GERD, anxiety, depression, hiatal hernia, anemia, and IBS and Crohn's resulting in SBO requiring resection and ileostomy in 02/2021. She presented to the ED via EMS due to abdominal pain and N/V/D. She was recently admitted for similar symptoms and was discharged on 8/26.  Patient started on Renal diet without fluid restriction yesterday. Temp HD cath placed yesterday as there was a tentative plan for CRRT. Renal function improving and Nephrology has signed off, HD cath to be removed, and no plan for CRRT.   Patient laying in bed with no family or visitors at bedside. She lives with a woman and that woman's daughters (who are not adults).   She expresses frustration that daughters often eat her food and drink her Ensure/Equate protein shakes as chocolate milk.   Patient gets fatigued and short of breath very easily at home which has made cooking difficult. She often opts for soup, a frozen meal, pizza delivery, and Ensure/Equate supplements. She sometimes experiences nausea with  the sight and/or smell of foods. It is not unusual for her to eat a few bites and then feel unable to continue.   She reports having difficulty keeping up with fluid intake to off-set ostomy output. She often has to empty ostomy bag 5x/day, routinely needing to get up 2x/night for this reason.   Patient is open about her struggle with depression over the past 5 months and factors that have contributed to this. We discussed her getting a small fridge to keep in her room to store protein shakes and other ready-to-eat, single serving foods. Discussed foods that can be beneficial for providing extra protein and calories and can help in thickening stools.  She had 3295 ml stool output yesterday and has had 450 ml so far today.   Weight today is documented as 109 lb and weight on 3/8 was documented as 134 lb. This indicates 25 lb weight loss (18.6% body weight) in the past 6 months; significant for time frame.    Labs reviewed; Na: 129 mmol/l, BUN: 48 mg/dl, creatinine: 2.33 mg/dl, Ca: 7.6 mg/dl, GFR: 23 ml/min. Medications reviewed; 40 mg IV protonix/day. IVF; NS @ 100 ml/hr (2400 ml/24 hrs).     NUTRITION - FOCUSED PHYSICAL EXAM:  Flowsheet Row Most Recent Value  Orbital Region Moderate depletion  Upper Arm Region Severe depletion  Thoracic and Lumbar Region Moderate depletion  Buccal Region Severe depletion  Temple Region Moderate depletion  Clavicle Bone Region Severe depletion  Clavicle and Acromion Bone Region Severe depletion  Scapular Bone Region Moderate depletion  Dorsal Hand Moderate depletion  Patellar Region Unable to assess  Anterior Thigh Region Unable to assess  Posterior Calf Region Unable to assess  Edema (RD Assessment) Unable to assess  Hair Reviewed  Eyes Reviewed  Mouth Reviewed  Skin Reviewed  Nails Reviewed       Diet Order:   Diet Order             Diet regular Room service appropriate? Yes; Fluid consistency: Thin  Diet effective now                    EDUCATION NEEDS:   Education needs have been addressed  Skin:  Skin Assessment: Reviewed RN Assessment  Last BM:  9/8 (125 ml via ileostomy)  Height:   Ht Readings from Last 1 Encounters:  07/09/21 5' 4"  (1.626 m)    Weight:   Wt Readings from Last 1 Encounters:  07/11/21 49.4 kg     Estimated Nutritional Needs:  Kcal:  1730-1950 kcal Protein:  90-105 grams Fluid:  >/= 3.5 L/day     Jarome Matin, MS, RD, LDN, CNSC Inpatient Clinical Dietitian RD pager # available in AMION  After hours/weekend pager # available in [facility]

## 2021-07-11 NOTE — Progress Notes (Signed)
PM rounds   Doing well Foley out CVL out Sitting on bed OFf pressors BP consistenly MAP > 65 Reports Low SBP at hjome - 80s. Chart review shows 120 in ER visit when agitated and clinic visit 92sbp  Plan  - move to med surg - await psych consult - trh primary from 07/12/21  And ccm off- app d/w Dr Coralee Pesa of [facility]    SIGNATURE    Dr. Brand Males, M.D., F.C.C.P,  Pulmonary and Critical Care Medicine Staff Physician, [place] Director - Interstitial Lung Disease  Program  P[facility] [place] [facility] [place], [place], [address]  NPI Number:  NPI #[id number]  Pager: [phone], If no answer  -> Check AMION or Try [phone] Telephone (clinical office): [phone] Telephone (research): [phone]  5:44 PM 07/11/2021

## 2021-07-11 NOTE — Progress Notes (Addendum)
[facility] Progress Note  Subjective: seen in ICU, feeling better, I/O +3 L, UOP good yesterday.   Vitals:   07/11/21 0600 07/11/21 0700 07/11/21 0800 07/11/21 0900  BP: 103/68 98/63 99/61  97/64  Pulse: 76 79 90 88  Resp: 10 12 12 10   Temp: (!) 97.5 F (36.4 C) (!) 97.3 F (36.3 C) (!) 97.3 F (36.3 C) (!) 97.3 F (36.3 C)  TempSrc:  Bladder    SpO2: 100% 100% 100% 98%  Weight:      Height:        Exam:  alert, nad   no jvd. Poor skin turgor  Chest cta bilat  Cor reg no RG  Abd soft ntnd no ascites, RLQ ostomy  GU foley in place   Ext no LE edema   Alert, NF, ox3      UA 9/6 - negative   UNa, UCr - pend    Renal US - pend    CXR = no acute disease      Date   Creat   eGFR    2012- 2021  0.79- 1.01    Feb - may 2022 0.54- 0.96  > 60 ml/min    June   1.28 >> 0.82  AKI, 48 - >60    Aug 2022  3.06 >> 1.49  AKI, 17 - 40    Sept 6  5.09   9    Sept 7, 2022  4.37   11  Assessment/ Plan: AKI - b/l creat 0.5- 0.9 from early 2022, eGFR > 60.  Recurrent AKI in June and Aug, higher peak Creat each time, these are likely due to worsening Crohn's disease w/ assoc recurrent vol depletion. AKI here w/ peak 5.2, down to 4.3 yesterday and 2.3 this am after high dose vol resuscitation. UOP up now. Should continue to improve. Will lower NS 0.9% to 100 cc/hr, taper off as needed.  Please order IV team to remove temp HD cath R IJ, if not otherwise needed. No further suggestions. Will sign off.  Temp HD cath placement - in RIJ.  Hyperkalemia - resolved Hyponatremia - hypovolemic, resolving Vol depletion - as above Hypotension - d/t vol depletion, doesn't appear septic. Not on any BP lowering meds at home.  Crohns disease - per pmd / GI.      Rob Demetrius Mahler 07/11/2021, 9:49 AM   Recent Labs  Lab 07/10/21 0443 07/10/21 0550 07/10/21 1837 07/11/21 0449  K 4.1  --  4.2 4.0  BUN 70*  --  58* 48*  CREATININE 4.37*  --  2.90* 2.33*  CALCIUM 8.5*  --  7.1* 7.6*  PHOS  6.6*  --   --  4.1  HGB  --    < > 7.9* 7.8*   < > = values in this interval not displayed.    Inpatient medications:  Chlorhexidine Gluconate Cloth  6 each Topical Daily   heparin  5,000 Units Subcutaneous Q8H   mouth rinse  15 mL Mouth Rinse BID   pantoprazole (PROTONIX) IV  40 mg Intravenous QHS   pramipexole  1 mg Oral Daily   venlafaxine XR  300 mg Oral Q breakfast    sodium chloride 150 mL/hr at 07/11/21 0855    ceFAZolin (ANCEF) IV     norepinephrine (LEVOPHED) Adult infusion Stopped (07/11/21 0640)   acetaminophen, heparin

## 2021-07-11 NOTE — Consult Note (Signed)
WOC in to see patient, she is resting. Pouch intact, wishes to wait on pouch change. HOP pouch not in room for use. Discussed with materials, delivered needed supplies should she have any issues.  I will plan to convert to [facility] pouch tomorrow in preparation for weekend when [facility] not on this campus.   1 HOP pouch/2 skin barriers in the room. Belt and powder as well if needed for bedside nursing.   [place], [place], [place]

## 2021-07-11 NOTE — Progress Notes (Signed)
HD cath removed. After HD cath removed myself and Darrick Meigs RN noticed that there was still some suture left in the skin from what appeared a spot that was closed up. Will continue to monitor. Will removed sutures in a couple days when site is not as tender.

## 2021-07-11 NOTE — Consult Note (Addendum)
Referring Provider:  [facility]         Primary Care Physician:  Cynthia Lung, MD Primary Gastroenterologist:    Cynthia Edward, MD Reason for Consultation:    Crohn's disease.                Attending physician's note   I have taken a history, examined the patient and reviewed the chart. I agree with the Advanced Practitioner's note, impression and recommendations.  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 and high-grade small bowel obstructions s/p ileocolectomy with ileostomy at [facility] in April 2022 admitted with high ileostomy output, dehydration and AKI  Will add soluble fiber, FiberCon 1 tablet 3 times daily Imodium twice daily to decrease output  Urine tox screen positive for cocaine  Advance as tolerated to regular high-protein diet Avoid high carb diet, soda and fruit juice  Jejunal intussusception on CT abdomen and pelvis, may need single balloon enteroscopy to further evaluate the area  Has persistent increased output, need to consider starting low-dose steroids, cholestyramine and octreotide Will arrange for outpatient GI follow-up on discharge with Dr. Fuller Plan   The patient was provided an opportunity to ask questions and all were answered. The patient agreed with the plan and demonstrated an understanding of the instructions.  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  She lost response to Humira and Stelara requiring multiple hospitalizations for ongoing symptoms and SBOs. Eventually had ileocolectomy with ileostomy at [facility] in April 2022 ( can't find full op report). She has since struggled with nausea / vomiting and high ileostomy output. This is her third admission since surgery for AKI / dehydration. She hasn't been on any Crohn's meds since prior to  bowel surgery. CTAP this admission suggests small bowel wall thickening in the left upper quadrant, likely jejunal loops with an area of proximal small bowel intussusception . Hopefully findings do not represent active Crohn's disease.  --Patient needs to be on anti-diarrheal agents at home ( says she hasn't taken anything to slow the diarrhea). Will start with BID imodium.  Continue IV fluids.  --Dr. Silverio [name] will review CT scan --Getting SQ heparin. At increased risk for DVT   # AKI, improving. Nephrology following. AKI felt to be prerenal as well as ischemic with hypotension.   # Hyponatremia / hyperkalemia, improving  # Possible abdominal wall cellulitis. On Ancef   HISTORY OF PRESENT ILLNESS                                                                                                                         Chief Complaint:  dehydration, high ileostomy output  Cynthia [name] is a 60 y.o. female with a past medical history significant for Crohn's ileitis, colon polyps. See PMH for any additional medical history.  Cynthia [name] was diagnosed with Crohn's ileitis after presenting to the hospital in October 2020 with a small bowel obstruction. She responded to steroids but had a difficult time moving to other forms of treatment due to insurance. She eventually started but failed / lost response to Humira and Stelara as she was hospitalized  numerous times with high grade bowel obstruction. Symptoms improved in short term steroids.  Surgery has evaluated her on several occasions but given improvement in symptoms with therapy, surgical intervention was not pursued. She did finally require surgery for for insurance purposes it had to be done at [facility] .  She had an ileocolectomy the ostomy in April 2022.  We have not seen her since that time.  She has not been on any Crohn's medications.  She did require admission to [facility] in May for dehydration/failure to thrive, high ileostomy  output with AKI.   [name] says that she has had frequent nausea and vomiting since surgery.  She vomits about every other day and that depends on what she eats.  Anything with any substance that causes her.  She reports high ileostomy output since surgery in April.  She feels her ostomy bag with watery output about 5 times a day.  No significant abdominal pain rather irritation at stoma site.  She has not been on any Crohn's medications since prior to surgery in April.  Patient presented to the ED two days ago.   ED course:  In the ED her sodium was 120, potassium greater than 7.5.  She was in AKI with a creatinine of 5.24.  WBC 11.2, hemoglobin 10.1  Since admission her creatinine has improved to 2.9, sodium still low but improving at 123.  Hemoglobin now 7.8  SIGNIFICANT DIAGNOTIC STUDIES    07/10/21 Non contrast CTAP IMPRESSION: 1. Small bowel wall thickening in the left upper quadrant, likely jejunal loops with an area of proximal small bowel intussusception measuring approximately 5-6 cm in length. There is some associated wall thickening and perienteric edema in the region of the intussusception but no dilatation proximal to the level of the intussusception. Oral contrast material presumably given for this study is distal to the intussusception in small bowel loops and in the ostomy bag. Given that the intussusception demonstrates wall thickening and perienteric edema, this is not felt to be a transient process in this individual. Close follow-up recommended as lead point lesion in the jejunum can not be excluded. 2. Cholelithiasis. 3. Aortic Atherosclerosis (ICD10-I70.0).        PREVIOUS ENDOSCOPIC EVALUATIONS   October 2020 colonoscopy with TI intubation --adequate prep. Significant looping of the sigmoid and ascending colon.  Two sessile polyps were removed from the rectum and sigmoid colon.  An area of mild vascular pattern decreased mucosa was found in the descending colon,  in the transverse colon, in the ascending colon and in the cecum.  Moderate inflammation and congestion with shallow ulcerations found in the terminal ileum.  Colonoscope was advanced approximately 10 cm into the ileum with noted luminal narrowing.    FINAL MICROSCOPIC DIAGNOSIS:   A. COLON, ILEUM, BIOPSY:  Small bowel mucosa with granulation tissue and exudate consistent with  ulcer.  Differential includes Crohn's.  No granulomas identified.   B. COLON, ASCENDING, BIOPSY:  Colonic mucosa with no significant pathologic changes.  No active inflammation, chronic changes or granulomas.   C. COLON, TRANSVERSE, BIOPSY:  Colonic mucosa with no significant pathologic changes.  No active inflammation, chronic changes or granulomas.  D. COLON, DESCENDING, BIOPSY:  Colonic mucosa with no significant pathologic changes.  No active inflammation, chronic changes or granulomas.   E. COLON, SIGMOID, BIOPSY:  Colonic mucosa with no significant pathologic changes.  No active inflammation, chronic changes or granulomas.   F. RECTUM, BIOPSY:  Colonic mucosa with no significant pathologic changes.  No active inflammation, chronic changes or granulomas.    G. RECTUM, POLYPECTOMY:  Tubular adenoma (s).  No high-grade dysplasia or carcinoma.      Past Medical History:  Diagnosis Date   Anemia 2012   Anxiety    Bronchitis    Crohn's ileitis ([place])    Depression    GERD (gastroesophageal reflux disease)    Headache(784.0)    MIGRAINE   Heart murmur    Hiatal hernia    Hypertension    IBS (irritable bowel syndrome)    Incontinence    MVP (mitral valve prolapse)    RLS (restless legs syndrome)    Tubular adenoma of colon 08/2019    Past Surgical History:  Procedure Laterality Date   ABDOMINAL HYSTERECTOMY  06/17/2011   Procedure: HYSTERECTOMY ABDOMINAL;  Surgeon: Arloa Koh;  Location: [facility];  Service: Gynecology;  Laterality: N/A;  Converted to Abdominal Hysterectomy with lysis  of adhesions    ANTERIOR AND POSTERIOR REPAIR  02/25/2012   Procedure: ANTERIOR (CYSTOCELE) AND POSTERIOR REPAIR (RECTOCELE);  Surgeon: Daria Pastures, MD;  Location: [facility];  Service: Gynecology;  Laterality: N/A;  ANterior cystocele repair ONLY   BIOPSY  08/29/2019   Procedure: BIOPSY;  Surgeon: Lavena Bullion, DO;  Location: [facility];  Service: Gastroenterology;;   BLADDER SUSPENSION  02/25/2012   Procedure: TRANSVAGINAL TAPE (TVT) PROCEDURE;  Surgeon: Daria Pastures, MD;  Location: [facility];  Service: Gynecology;  Laterality: N/A;   COLONOSCOPY WITH PROPOFOL N/A 08/29/2019   Procedure: COLONOSCOPY WITH PROPOFOL;  Surgeon: Lavena Bullion, DO;  Location: [facility];  Service: Gastroenterology;  Laterality: N/A;   CYSTOSCOPY  02/25/2012   Procedure: CYSTOSCOPY;  Surgeon: Daria Pastures, MD;  Location: [facility];  Service: Gynecology;  Laterality: N/A;   ILEOSTOMY     KNEE ARTHROSCOPY  2010   LAPAROSCOPIC ASSISTED VAGINAL HYSTERECTOMY  06/17/2011   Procedure: LAPAROSCOPIC ASSISTED VAGINAL HYSTERECTOMY;  Surgeon: Arloa Koh;  Location: [facility];  Service: Gynecology;  Laterality: N/A;  Attempted    POLYPECTOMY  08/29/2019   Procedure: POLYPECTOMY;  Surgeon: Lavena Bullion, DO;  Location: [facility];  Service: Gastroenterology;;   SALPINGOOPHORECTOMY  06/17/2011   Procedure: SALPINGO OOPHERECTOMY;  Surgeon: Arloa Koh;  Location: [facility];  Service: Gynecology;  Laterality: Bilateral;   TONSILLECTOMY      Prior to Admission medications   Medication Sig Start Date End Date Taking? Authorizing Provider  acetaminophen (TYLENOL) 325 MG tablet Take 650 mg by mouth every 6 (six) hours as needed for mild pain, fever or headache.   Yes [provider]  esomeprazole (NEXIUM) 40 MG capsule TAKE ONE (1) CAPSULE BY MOUTH 2 TIMES DAILY Patient taking differently: Take 40 mg by mouth 2 (two) times daily before a meal. 03/05/21  Yes Cynthia Lung, MD  pramipexole (MIRAPEX)  1 MG tablet Take 1 tablet (1 mg total) by mouth daily. 03/05/21  Yes Cynthia Lung, MD  venlafaxine XR (EFFEXOR-XR) 150 MG 24 hr capsule Take 2 capsules (300 mg total) by mouth daily with breakfast. 03/05/21  Yes Cynthia Lung, MD  loperamide (IMODIUM) 2 MG capsule Take 1  capsule (2 mg total) by mouth daily as needed for diarrhea or loose stools. Patient not taking: Reported on 07/10/2021 06/28/21   Aline August, MD  polycarbophil (FIBERCON) 625 MG tablet Take 1 tablet (625 mg total) by mouth 2 (two) times daily. Patient not taking: No sig reported 01/13/21   Mendel Corning, MD    Current Facility-Administered Medications  Medication Dose Route Frequency Provider Last Rate Last Admin   0.9 %  sodium chloride infusion   Intravenous Continuous Roney Jaffe, MD 100 mL/hr at 07/11/21 0958 Rate Change at 07/11/21 0958   acetaminophen (TYLENOL) tablet 650 mg  650 mg Oral Q4H PRN Renee Pain, MD   650 mg at 07/11/21 0217   ceFAZolin (ANCEF) IVPB 2g/100 mL premix  2 g Intravenous Q12H Brand Males, MD       Chlorhexidine Gluconate Cloth 2 % PADS 6 each  6 each Topical Daily Renee Pain, MD   6 each at 07/11/21 1016   heparin injection 1,000-6,000 Units  1,000-6,000 Units Intracatheter PRN Lestine Mount, PA-C   4,000 Units at 07/10/21 6283   heparin injection 5,000 Units  5,000 Units Subcutaneous Q8H Renee Pain, MD   5,000 Units at 07/11/21 0537   MEDLINE mouth rinse  15 mL Mouth Rinse BID Brand Males, MD   15 mL at 07/11/21 1001   norepinephrine (LEVOPHED) 45m in 2547mpremix infusion  0-40 mcg/min Intravenous Continuous Cardama, PeGrayce SessionsMD   Stopped at 07/11/21 0640   pantoprazole (PROTONIX) injection 40 mg  40 mg Intravenous QHS JeRenee PainMD   40 mg at 07/10/21 2102   pramipexole (MIRAPEX) tablet 1 mg  1 mg Oral Daily Re[place]evada Crane, PA-C   1 mg at 07/11/21 1001   venlafaxine XR (EFFEXOR-XR) 24 hr capsule 300 mg  300 mg Oral Q breakfast ReNevada Crane, PA-C   300 mg at 07/11/21 071517  Allergies as of 07/09/2021 - Review Complete 07/09/2021  Allergen Reaction Noted   Iodine Other (See Comments) 03/17/2008    Family History  Problem Relation Age of Onset   Diabetes Mother    Dementia Mother    Clotting disorder Father    Macular degeneration Father    Crohn's disease Sister    Colon cancer Neg Hx    Esophageal cancer Neg Hx    Pancreatic cancer Neg Hx    Stomach cancer Neg Hx    Liver disease Neg Hx     Social History   Socioeconomic History   Marital status: Widowed    Spouse name: Not on file   Number of children: 1   Years of education: Not on file   Highest education level: Not on file  Occupational History   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, television/film [employer]  Tobacco Use   Smoking status: Some Days    Packs/day: 0.00    Types: Cigarettes   Smokeless tobacco: Never   Tobacco comments:    none in 2 weeks as of 12/04/20  Vaping Use   Vaping Use: Former  Substance and Sexual Activity   Alcohol use: Not Currently   Drug use: Not Currently    Types: Cocaine    Comment: clean x 8 weeks   Sexual activity: Not Currently  Other Topics Concern   Not on file  Social History Narrative   Lives with boyfriend who she states is an alcoholic   Social Determinants of [employer] broadcast assistanttrain: Not on  file  Food Insecurity: Not on file  Transportation Needs: Not on file  Physical Activity: Not on file  Stress: Not on file  Social Connections: Not on file  Intimate Partner Violence: Not on file    Review of Systems: All systems reviewed and negative except where noted in HPI.   OBJECTIVE    Physical Exam: Vital signs in last 24 hours: Temp:  [97.2 F (36.2 C)-98.1 F (36.7 C)] 97.5 F (36.4 C) (09/08 1100) Pulse Rate:  [74-94] 87 (09/08 1100) Resp:  [9-16] 13 (09/08 1100) BP: (78-150)/(48-94) 90/59 (09/08 1100) SpO2:  [86 %-100 %] 95 % (09/08 1100) Weight:  [49.4 kg] 49.4 kg (09/08  0400) Last BM Date: 07/11/21 General:   Alert  female in NAD Psych:  Pleasant, cooperative. Normal mood and affect. Eyes:  Pupils equal, sclera clear, no icterus.   Conjunctiva pink. Ears:  Normal auditory acuity. Nose:  No deformity, discharge,  or lesions. Neck:  Supple; no masses Lungs:  Clear throughout to auscultation.   No wheezes, crackles, or rhonchi.  Heart:  Regular rate and rhythm;  no lower extremity edema Abdomen:  Soft, non-distended, nontender, BS active, no palp mass  Ileostomy bag is full of green, watery output.  Rectal:  Deferred  Msk:  Symmetrical without gross deformities. . Neurologic:  Alert and  oriented x4;  grossly normal neurologically. Skin:  Intact without significant lesions or rashes.   Scheduled inpatient medications  Chlorhexidine Gluconate Cloth  6 each Topical Daily   heparin  5,000 Units Subcutaneous Q8H   mouth rinse  15 mL Mouth Rinse BID   pantoprazole (PROTONIX) IV  40 mg Intravenous QHS   pramipexole  1 mg Oral Daily   venlafaxine XR  300 mg Oral Q breakfast      Intake/Output from previous day: 09/07 0701 - 09/08 0700 In: 4544.3 [I.V.:3150.2; IV Piggyback:1394.2] Out: 6503 [Urine:1020; TWSFK:8127] Intake/Output this shift: Total I/O In: -  Out: 450 [Stool:450]   Lab Results: Recent Labs    07/10/21 0550 07/10/21 1837 07/11/21 0449  WBC 11.2* 5.6 5.1  HGB 10.1* 7.9* 7.8*  HCT 29.1* 23.2* 22.6*  PLT 424* 244 251   BMET Recent Labs    07/10/21 0443 07/10/21 1837 07/11/21 0449  NA 127* 123* 129*  K 4.1 4.2 4.0  CL 91* 95* 103  CO2 21* 21* 19*  GLUCOSE 257* 98 76  BUN 70* 58* 48*  CREATININE 4.37* 2.90* 2.33*  CALCIUM 8.5* 7.1* 7.6*   LFT Recent Labs    07/10/21 1837  PROT 4.5*  ALBUMIN 2.4*  AST 17  ALT 15  ALKPHOS 39  BILITOT 0.6   PT/INR No results for input(s): LABPROT, INR in the last 72 hours. Hepatitis Panel No results for input(s): HEPBSAG, HCVAB, HEPAIGM, HEPBIGM in the last 72  hours.   . CBC Latest Ref Rng & Units 07/11/2021 07/10/2021 07/10/2021  WBC 4.0 - 10.5 K/uL 5.1 5.6 11.2(H)  Hemoglobin 12.0 - 15.0 g/dL 7.8(L) 7.9(L) 10.1(L)  Hematocrit 36.0 - 46.0 % 22.6(L) 23.2(L) 29.1(L)  Platelets 150 - 400 K/uL 251 244 424(H)    . CMP Latest Ref Rng & Units 07/11/2021 07/10/2021 07/10/2021  Glucose 70 - 99 mg/dL 76 98 257(H)  BUN 8 - 23 mg/dL 48(H) 58(H) 70(H)  Creatinine 0.44 - 1.00 mg/dL 2.33(H) 2.90(H) 4.37(H)  Sodium 135 - 145 mmol/L 129(L) 123(L) 127(L)  Potassium 3.5 - 5.1 mmol/L 4.0 4.2 4.1  Chloride 98 - 111 mmol/L 103 95(L) 91(L)  CO2 22 - 32 mmol/L 19(L) 21(L) 21(L)  Calcium 8.9 - 10.3 mg/dL 7.6(L) 7.1(L) 8.5(L)  Total Protein 6.5 - 8.1 g/dL - 4.5(L) -  Total Bilirubin 0.3 - 1.2 mg/dL - 0.6 -  Alkaline Phos 38 - 126 U/L - 39 -  AST 15 - 41 U/L - 17 -  ALT 0 - 44 U/L - 15 -   Studies/Results: CT ABDOMEN PELVIS WO CONTRAST  Result Date: 07/10/2021 CLINICAL DATA:  Abdominal pain and diarrhea.  Renal failure. EXAM: CT ABDOMEN AND PELVIS WITHOUT CONTRAST TECHNIQUE: Multidetector CT imaging of the abdomen and pelvis was performed following the standard protocol without IV contrast. COMPARISON:  06/25/2021 FINDINGS: Lower chest: Unremarkable. Hepatobiliary: No focal abnormality in the liver on this study without intravenous contrast. Tiny calcified gallstone evident. No intrahepatic or extrahepatic biliary dilation. Pancreas: No focal mass lesion. No dilatation of the main duct. No intraparenchymal cyst. No peripancreatic edema. Spleen: No splenomegaly. No focal mass lesion. Adrenals/Urinary Tract: No adrenal nodule or mass. Kidneys unremarkable on noncontrast imaging. No evidence for hydroureter. Foley catheter decompresses the urinary bladder. Stomach/Bowel: Stomach is unremarkable. No gastric wall thickening. No evidence of outlet obstruction. Duodenum is normally positioned as is the ligament of Treitz. Small bowel wall thickening noted in the left upper quadrant,  likely jejunal loops with an area jejunal intussusception measuring approximately 5-6 cm in length (axial image 38/2). There is some associated wall thickening and perienteric edema but no dilatation proximal to the level of the intussusception. Right abdominal ileostomy noted. A second stoma to the left of the midline is presumably a mucous fistula. Vascular/Lymphatic: There is mild atherosclerotic calcification of the abdominal aorta without aneurysm. There is no gastrohepatic or hepatoduodenal ligament lymphadenopathy. No retroperitoneal or mesenteric lymphadenopathy. No pelvic sidewall lymphadenopathy. Reproductive: Hysterectomy.  There is no adnexal mass. Other: No intraperitoneal free fluid. Musculoskeletal: No worrisome lytic or sclerotic osseous abnormality. IMPRESSION: 1. Small bowel wall thickening in the left upper quadrant, likely jejunal loops with an area of proximal small bowel intussusception measuring approximately 5-6 cm in length. There is some associated wall thickening and perienteric edema in the region of the intussusception but no dilatation proximal to the level of the intussusception. Oral contrast material presumably given for this study is distal to the intussusception in small bowel loops and in the ostomy bag. Given that the intussusception demonstrates wall thickening and perienteric edema, this is not felt to be a transient process in this individual. Close follow-up recommended as lead point lesion in the jejunum can not be excluded. 2. Cholelithiasis. 3. Aortic Atherosclerosis (ICD10-I70.0). Electronically Signed   By: Misty Stanley M.D.   On: 07/10/2021 13:05   US RENAL  Result Date: 07/10/2021 CLINICAL DATA:  Acute kidney injury EXAM: RENAL / URINARY TRACT ULTRASOUND COMPLETE COMPARISON:  None. FINDINGS: Right Kidney: Renal measurements: 11.5 x 4.5 x 4.7 cm = volume: 127.3 mL. Echogenicity within normal limits. No mass or hydronephrosis visualized. Left Kidney: Poorly  visualized due to bowel gas. Renal measurements: 9 x 5.2 x 4.5 cm = volume: 109.5 mL. Echogenicity within normal limits. No mass or hydronephrosis visualized. Bladder: Partially decompressed by Foley. Appears normal for degree of bladder distention. Other: None. IMPRESSION: No hydronephrosis. Electronically Signed   By: Macy Mis M.D.   On: 07/10/2021 18:27   DG CHEST PORT 1 VIEW  Result Date: 07/10/2021 CLINICAL DATA:  Central line placement. EXAM: PORTABLE CHEST 1 VIEW COMPARISON:  07/09/2021. FINDINGS: Right IJ central venous catheter with tip projecting  at the superior cavoatrial junction. No visible pneumothorax on this semi erect radiograph. No consolidation. No pleural effusions. Cardiomediastinal silhouette is within normal limits. IMPRESSION: Right IJ central venous catheter with tip projecting at the superior cavoatrial junction. No visible pneumothorax. Electronically Signed   By: Margaretha Sheffield M.D.   On: 07/10/2021 06:25   DG CHEST PORT 1 VIEW  Result Date: 07/10/2021 Renee Pain, MD     07/10/2021  4:46 AM Central Venous Catheter Insertion Procedure Note JATIA MUSA [id number] 11-May-1961 Date:07/10/21 Time:4:45 AM Provider Performing:Seong-Joo Carson Myrtle Procedure: Insertion of Non-tunneled Central Venous [phone]) with US guidance ([id number]) Indication(s) Medication administration and Hemodialysis Consent Risks of the procedure as well as the alternatives and risks of each were explained to the patient and/or caregiver.  Consent for the procedure was obtained and is signed in the bedside chart Anesthesia Topical only with 1% lidocaine Timeout Verified patient identification, verified procedure, site/side was marked, verified correct patient position, special equipment/implants available, medications/allergies/relevant history reviewed, required imaging and test results available. Sterile Technique Maximal sterile technique including full sterile barrier drape, hand hygiene, sterile  gown, sterile gloves, mask, hair covering, sterile ultrasound probe cover (if used). Procedure Description Area of catheter insertion was cleaned with chlorhexidine and draped in sterile fashion.  With real-time ultrasound guidance a HD catheter was placed into the right subclavian vein. Nonpulsatile blood flow and easy flushing noted in all ports.  The catheter was sutured in place and sterile dressing applied. Complications/Tolerance None; patient tolerated the procedure well. Chest X-ray is ordered to verify placement. EBL Minimal Specimen(s) None Renee Pain, MD Board Certified by the ABIM, Pulmonary Diseases & Critical Care Medicine   DG Chest Port 1 View  Result Date: 07/09/2021 CLINICAL DATA:  Tachycardia nausea EXAM: PORTABLE CHEST 1 VIEW COMPARISON:  06/25/2021 FINDINGS: The heart size and mediastinal contours are within normal limits. Both lungs are clear. The visualized skeletal structures are unremarkable. IMPRESSION: No active disease. Electronically Signed   By: Donavan Foil M.D.   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

## 2021-07-11 NOTE — Progress Notes (Signed)
[facility] Progress Note Patient Name: Cynthia [name] DOB: 03-28-1961 MRN: [id number]   Date of Service  07/11/2021  HPI/Events of Note  Patient requesting a sleep aid.  eICU Interventions  Melatonin 3 mg po Q HS PRN insomnia ordered.        Kerry Kass Shavontae Gibeault 07/11/2021, 8:45 PM

## 2021-07-12 DIAGNOSIS — Z932 Ileostomy status: Secondary | ICD-10-CM

## 2021-07-12 DIAGNOSIS — E43 Unspecified severe protein-calorie malnutrition: Secondary | ICD-10-CM

## 2021-07-12 LAB — CBC
HCT: 26 % — ABNORMAL LOW (ref 36.0–46.0)
Hemoglobin: 8.9 g/dL — ABNORMAL LOW (ref 12.0–15.0)
MCH: 30.4 pg (ref 26.0–34.0)
MCHC: 34.2 g/dL (ref 30.0–36.0)
MCV: 88.7 fL (ref 80.0–100.0)
Platelets: 267 10*3/uL (ref 150–400)
RBC: 2.93 MIL/uL — ABNORMAL LOW (ref 3.87–5.11)
RDW: 12.9 % (ref 11.5–15.5)
WBC: 5.9 10*3/uL (ref 4.0–10.5)
nRBC: 0 % (ref 0.0–0.2)

## 2021-07-12 LAB — BASIC METABOLIC PANEL
Anion gap: 6 (ref 5–15)
BUN: 30 mg/dL — ABNORMAL HIGH (ref 8–23)
CO2: 19 mmol/L — ABNORMAL LOW (ref 22–32)
Calcium: 7.8 mg/dL — ABNORMAL LOW (ref 8.9–10.3)
Chloride: 102 mmol/L (ref 98–111)
Creatinine, Ser: 1.51 mg/dL — ABNORMAL HIGH (ref 0.44–1.00)
GFR, Estimated: 39 mL/min — ABNORMAL LOW (ref >60)
Glucose, Bld: 80 mg/dL (ref 70–99)
Potassium: 4.1 mmol/L (ref 3.5–5.1)
Sodium: 127 mmol/L — ABNORMAL LOW (ref 135–145)

## 2021-07-12 LAB — MAGNESIUM: Magnesium: 1.8 mg/dL (ref 1.7–2.4)

## 2021-07-12 LAB — PHOSPHORUS: Phosphorus: 3.3 mg/dL (ref 2.5–4.6)

## 2021-07-12 LAB — PROCALCITONIN: Procalcitonin: <0.1 ng/mL

## 2021-07-12 MED ORDER — SODIUM BICARBONATE 8.4 % IV SOLN
INTRAVENOUS | Status: DC
  Filled 2021-07-12 (×2): qty 150
  Filled 2021-07-12: qty 1000

## 2021-07-12 MED ORDER — LOPERAMIDE HCL 2 MG PO CAPS
4.0000 mg | ORAL_CAPSULE | Freq: Two times a day (BID) | ORAL | Status: DC
  Administered 2021-07-12: 4 mg via ORAL
  Filled 2021-07-12: qty 2

## 2021-07-12 MED ORDER — PANTOPRAZOLE SODIUM 40 MG PO TBEC
40.0000 mg | DELAYED_RELEASE_TABLET | Freq: Every day | ORAL | Status: DC
  Administered 2021-07-12 – 2021-07-14 (×3): 40 mg via ORAL
  Filled 2021-07-12 (×2): qty 1

## 2021-07-12 MED ORDER — LOPERAMIDE HCL 2 MG PO CAPS
4.0000 mg | ORAL_CAPSULE | Freq: Once | ORAL | Status: AC
  Administered 2021-07-12: 4 mg via ORAL
  Filled 2021-07-12: qty 2

## 2021-07-12 NOTE — Evaluation (Signed)
Occupational Therapy Evaluation Patient Details Name: Cynthia [name] MRN: [id number] DOB: 12/12/1960 Today's Date: 07/12/2021    History of Present Illness 60 year old past medical history of hypertension, GERD, presents depression, anxiety, Crohn's disease s/p resection/ileostomy on 02/2021.  Reported abdominal pain, nausea, vomiting and diarrhea.  Recent hospitalization for similar complain, discharged 06/28/21.   Clinical Impression   Patient is independent with lower body dressing, functional transfers and ambulation without assistance or use of AD. Patient reports feeling a little shaky but no loss of balance noted. Patient states has been using bathroom without difficulty. No acute OT needs at this time, will sign off.     Follow Up Recommendations  No OT follow up    Equipment Recommendations  None recommended by OT       Precautions / Restrictions Precautions Precautions: None Restrictions Weight Bearing Restrictions: No      Mobility Bed Mobility Overal bed mobility: Independent                  Transfers Overall transfer level: Independent                    Balance Overall balance assessment: Independent                                         ADL either performed or assessed with clinical judgement   ADL Overall ADL's : Independent;At baseline                                       General ADL Comments: patient is able to perform lower body dressing, functional transfers and ambulation without assistance. reports has been using bathroom without difficulty      Pertinent Vitals/Pain Pain Assessment: Faces Faces Pain Scale: Hurts a little bit Pain Location: abdomen/indigestion Pain Descriptors / Indicators: Aching Pain Intervention(s): Monitored during session     Hand Dominance Right   Extremity/Trunk Assessment Upper Extremity Assessment Upper Extremity Assessment: Overall WFL for tasks assessed    Lower Extremity Assessment Lower Extremity Assessment: Defer to PT evaluation   Cervical / Trunk Assessment Cervical / Trunk Assessment: Normal   Communication Communication Communication: No difficulties   Cognition Arousal/Alertness: Awake/alert Behavior During Therapy: WFL for tasks assessed/performed Overall Cognitive Status: Within Functional Limits for tasks assessed                                                Home Living Family/patient expects to be discharged to:: Private residence Living Arrangements: Other (Comment) (roommate and roommates 3 kids)   Type of Home: House Home Access: Stairs to enter Technical brewer of Steps: 6 Entrance Stairs-Rails: Right Home Layout: One level     Bathroom Shower/Tub: Occupational psychologist: Standard Bathroom Accessibility: Yes How Accessible: Accessible via walker Home Equipment: [place] - 2 wheels;Walker - 4 wheels          Prior Functioning/Environment Level of Independence: Independent                 OT Problem List: Decreased activity tolerance         OT Goals(Current goals can be found in the care  plan section) Acute Rehab OT Goals Patient Stated Goal: walk OT Goal Formulation: All assessment and education complete, DC therapy        AM-PAC OT "6 Clicks" Daily Activity     Outcome Measure Help from another person eating meals?: None Help from another person taking care of personal grooming?: None Help from another person toileting, which includes using toliet, bedpan, or urinal?: None Help from another person bathing (including washing, rinsing, drying)?: None Help from another person to put on and taking off regular upper body clothing?: None Help from another person to put on and taking off regular lower body clothing?: None 6 Click Score: 24   End of Session Equipment Utilized During Treatment:  (none) Nurse Communication: Mobility status  Activity  Tolerance: Patient tolerated treatment well Patient left: Other (comment) (with PT)  OT Visit Diagnosis: Other abnormalities of gait and mobility (R26.89)                Time: 4166-0630 OT Time Calculation (min): 12 min Charges:  OT General Charges $OT Visit: 1 Visit OT Evaluation $OT Eval Low Complexity: [place] OT OT pager: [place] 07/12/2021, 2:21 PM

## 2021-07-12 NOTE — Progress Notes (Addendum)
Progress Note Hospital Day: 4  Chief Complaint:    high ostomy output    Attending physician's note   I have taken an interval history, reviewed the chart and examined the patient. I agree with the Advanced Practitioner's note, impression and recommendations.    Ileostomy output has improved, only 1250 cc in the past 24 hours Continue FiberCon and Imodium  ?  Jejunal intussusception, will order small bowel follow-through barium study to further delineate the area and exclude any intramucosal lesion  AKI improving Advance diet as tolerated  GI will not round over the weekend but available if needed  I have spent 25 minutes of patient care (this includes precharting, chart review, review of results, face-to-face time used for counseling as well as treatment plan and follow-up. The patient was provided an opportunity to ask questions and all were answered. The patient agreed with the plan and demonstrated an understanding of the instructions.  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  She lost response to Humira and Stelara requiring multiple hospitalizations for ongoing symptoms and SBOs. Eventually had ileocolectomy with ileostomy at [facility] in April 2022 ( can't find full op report). She has since struggled with nausea / vomiting and high ileostomy output. This is her third admission since surgery for AKI / dehydration. She hasn't been on any Crohn's meds since prior to bowel surgery. CTAP this admission suggests small bowel wall thickening in the left upper quadrant, likely jejunal loops with an area of proximal small bowel intussusception . Hopefully findings do not represent active Crohn's disease.  --Yesterday we started low dose Imodium and fiber. She hasn't noticed any improvement but 24 hr output has decreased   (2275 ml >> 1250 ml).  --Will increase Imodium to 4 mg BID, continue Fiber. If no meaningful improvement then we can add additional agents but yesterday's output of 1200 ml is not far from normal. Stool consistency does seem more watery than would expect this far out from surgery but addition of fiber may help --May need single balloon enteroscopy to further evaluate jejunal intussusception seen on CTAP --Getting SQ heparin. At increased risk for DVT   # AKI, improving. Cr 4.37 >> 2.3 >> 1.5. GFR 39. AKI felt to be prerenal as well as ischemic with hypotension.    # Hyponatremia, improving. Getting NS at 100 ml / hr  # Urine tox screen positive for cocaine.    DIAGNOSTIC STUDIES THIS ADMISSION:  07/10/21 Non contrast CTAP IMPRESSION: 1. Small bowel wall thickening in the left upper quadrant, likely jejunal loops with an area of proximal small bowel intussusception measuring approximately 5-6 cm in length. There is some associated wall thickening and perienteric edema in the region of the intussusception but no dilatation proximal to the level of the intussusception. Oral contrast material presumably given for this study is distal to the intussusception in small bowel loops and in the ostomy bag. Given that the intussusception demonstrates wall thickening and perienteric edema, this is not felt to be a transient process in this individual. Close follow-up recommended as lead point lesion in the jejunum can not be excluded. 2. Cholelithiasis. 3. Aortic Atherosclerosis (ICD10-I70.0).   SUBJECTIVE   Feels better compared to yesterday but doesn't feel there has not been any improvement in high ostomy output.  OBJECTIVE      Scheduled inpatient medications:   Chlorhexidine Gluconate Cloth  6 each Topical Daily   feeding supplement  237 mL Oral TID BM   heparin  5,000 Units Subcutaneous Q8H   loperamide  2 mg Oral BID   mouth rinse  15 mL Mouth Rinse BID   multivitamin with  minerals  1 tablet Oral Daily   pantoprazole (PROTONIX) IV  40 mg Intravenous QHS   polycarbophil  625 mg Oral TID   pramipexole  1 mg Oral Daily   venlafaxine XR  300 mg Oral Q breakfast   Continuous inpatient infusions:   sodium chloride Stopped (07/12/21 0659)    ceFAZolin (ANCEF) IV 2 g (07/12/21 0957)   PRN inpatient medications: acetaminophen, heparin, melatonin  Vital signs in last 24 hours: Temp:  [96.8 F (36 C)-97.7 F (36.5 C)] 97.5 F (36.4 C) (09/09 0800) Pulse Rate:  [72-105] 78 (09/09 0700) Resp:  [10-20] 18 (09/09 0800) BP: (89-110)/(52-71) 103/71 (09/09 0600) SpO2:  [89 %-100 %] 100 % (09/09 0700) Weight:  [51 kg] 51 kg (09/09 0500) Last BM Date: 07/12/21 (per ostomy output)  Intake/Output Summary (Last 24 hours) at 07/12/2021 1127 Last data filed at 07/12/2021 1100 Gross per 24 hour  Intake 4417.97 ml  Output 1735 ml  Net 2682.97 ml     Physical Exam:  General: Alert female in NAD Heart:  Regular rate and rhythm. No lower extremity edema Pulmonary: Normal respiratory effort Abdomen: Soft, nondistended, nontender. Normal bowel sounds.  Neurologic: Alert and oriented Psych: Pleasant. Cooperative.   Filed Weights   07/10/21 0937 07/11/21 0400 07/12/21 0500  Weight: 43.2 kg 49.4 kg 51 kg    Intake/Output from previous day: 09/08 0701 - 09/09 0700 In: 4418 [P.O.:500; I.V.:2455.2; IV Piggyback:1462.8] Out: 1610 [Urine:360; Stool:1250] Intake/Output this shift: Total I/O In: -  Out: 575 [Urine:100; Stool:475]    Lab Results: Recent Labs    07/10/21 1837 07/11/21 0449 07/12/21 0242  WBC 5.6 5.1 5.9  HGB 7.9* 7.8* 8.9*  HCT 23.2* 22.6* 26.0*  PLT 244 251 267   BMET Recent Labs    07/10/21 1837 07/11/21 0449 07/12/21 0242  NA 123* 129* 127*  K 4.2 4.0 4.1  CL 95* 103 102  CO2 21* 19* 19*  GLUCOSE 98 76 80  BUN 58* 48* 30*  CREATININE 2.90* 2.33* 1.51*  CALCIUM 7.1* 7.6* 7.8*   LFT Recent Labs    07/10/21 1837  PROT 4.5*   ALBUMIN 2.4*  AST 17  ALT 15  ALKPHOS 39  BILITOT 0.6   PT/INR No results for input(s): LABPROT, INR in the last 72 hours. Hepatitis Panel No results for input(s): HEPBSAG, HCVAB, HEPAIGM, HEPBIGM in the last 72 hours.  CT ABDOMEN PELVIS WO CONTRAST  Result Date: 07/10/2021 CLINICAL DATA:  Abdominal pain and diarrhea.  Renal failure. EXAM: CT ABDOMEN AND PELVIS WITHOUT CONTRAST TECHNIQUE: Multidetector CT imaging of the abdomen and pelvis was performed following the standard protocol without IV contrast. COMPARISON:  06/25/2021 FINDINGS: Lower chest: Unremarkable. Hepatobiliary: No focal abnormality in the liver on this study without intravenous contrast. Tiny calcified gallstone evident. No intrahepatic or extrahepatic biliary dilation. Pancreas: No focal mass lesion. No dilatation of the main duct. No intraparenchymal cyst. No peripancreatic edema. Spleen: No splenomegaly. No focal mass lesion. Adrenals/Urinary Tract: No adrenal nodule or mass. Kidneys unremarkable on noncontrast imaging. No evidence for hydroureter. Foley catheter decompresses the urinary bladder. Stomach/Bowel: Stomach is unremarkable. No gastric wall thickening. No  evidence of outlet obstruction. Duodenum is normally positioned as is the ligament of Treitz. Small bowel wall thickening noted in the left upper quadrant, likely jejunal loops with an area jejunal intussusception measuring approximately 5-6 cm in length (axial image 38/2). There is some associated wall thickening and perienteric edema but no dilatation proximal to the level of the intussusception. Right abdominal ileostomy noted. A second stoma to the left of the midline is presumably a mucous fistula. Vascular/Lymphatic: There is mild atherosclerotic calcification of the abdominal aorta without aneurysm. There is no gastrohepatic or hepatoduodenal ligament lymphadenopathy. No retroperitoneal or mesenteric lymphadenopathy. No pelvic sidewall lymphadenopathy.  Reproductive: Hysterectomy.  There is no adnexal mass. Other: No intraperitoneal free fluid. Musculoskeletal: No worrisome lytic or sclerotic osseous abnormality. IMPRESSION: 1. Small bowel wall thickening in the left upper quadrant, likely jejunal loops with an area of proximal small bowel intussusception measuring approximately 5-6 cm in length. There is some associated wall thickening and perienteric edema in the region of the intussusception but no dilatation proximal to the level of the intussusception. Oral contrast material presumably given for this study is distal to the intussusception in small bowel loops and in the ostomy bag. Given that the intussusception demonstrates wall thickening and perienteric edema, this is not felt to be a transient process in this individual. Close follow-up recommended as lead point lesion in the jejunum can not be excluded. 2. Cholelithiasis. 3. Aortic Atherosclerosis (ICD10-I70.0). Electronically Signed   By: Misty Stanley M.D.   On: 07/10/2021 13:05   US RENAL  Result Date: 07/10/2021 CLINICAL DATA:  Acute kidney injury EXAM: RENAL / URINARY TRACT ULTRASOUND COMPLETE COMPARISON:  None. FINDINGS: Right Kidney: Renal measurements: 11.5 x 4.5 x 4.7 cm = volume: 127.3 mL. Echogenicity within normal limits. No mass or hydronephrosis visualized. Left Kidney: Poorly visualized due to bowel gas. Renal measurements: 9 x 5.2 x 4.5 cm = volume: 109.5 mL. Echogenicity within normal limits. No mass or hydronephrosis visualized. Bladder: Partially decompressed by Foley. Appears normal for degree of bladder distention. Other: None. IMPRESSION: No hydronephrosis. Electronically Signed   By: Macy Mis M.D.   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

## 2021-07-12 NOTE — Progress Notes (Addendum)
Report given to [place] on 6E. Pts colostomy supplies placed in pt belongings to go with pt to 1615.

## 2021-07-12 NOTE — Consult Note (Signed)
[facility] Nurse ostomy follow up Stoma type/location: RLQ, end ileostomy  Stomal assessment/size: 1 3/4" round, pink, budded  Peristomal assessment: Not assessed  Treatment options for stomal/peristomal skin: she self reports using barrier ring; some type of cream, and crusting peristomal skin. She has window framed the tape border with medifix tape like she uses at home Output liquid green; 250 emptied when I arrived to the room Ostomy pouching: 1pc./2pc.  Education provided: NA, patient independent with her ostomy care.  She reports crusting peristomal skin with ostomy powder and skin barrier wipe. She also reported using some type of cream, I am unsure of the cream used, not seen in patient's room We were planning to convert her to high output pouch, however she changed to 1pc flex convex when pouch "exploded".  I have explained to the patient rationale for high output pouch and use of BSD, she has limited recall at times.  I noted also in the chart she has cocaine history and her non compliance and multiple readmissions suspect related to substance abuse and neglect to herself. Lives with roommate that is reported to be a user as well.  Enrolled patient in [place] Start Discharge program: Yes, previously   [facility] nursing team will follow remotely for needs, patient is independent in care of her ostomy and is aware of care needs related to peristomal skin and use of ostomy belt.   [place], [place], [place]

## 2021-07-12 NOTE — Evaluation (Signed)
Physical Therapy Evaluation Patient Details Name: Cynthia [name] MRN: [id number] DOB: [birth year] Today's Date: 07/12/2021   History of Present Illness  60 year old past medical history of hypertension, GERD, presents depression, anxiety, Crohn's disease s/p resection/ileostomy on 02/2021.  Reported abdominal pain, nausea, vomiting and diarrhea.  Recent hospitalization for similar complain, discharged 06/28/21.  Clinical Impression  Pt admitted as above and presenting with functional mobility limitations 2* decreased endurance and mild ambulatory balance deficits.  Pt should progress well to dc home with intermittent assist of friends.    Follow Up Recommendations Supervision - Intermittent;No PT follow up    Equipment Recommendations  None recommended by PT    Recommendations for Other Services       Precautions / Restrictions Precautions Precautions: None Restrictions Weight Bearing Restrictions: No      Mobility  Bed Mobility Overal bed mobility: Independent             General bed mobility comments: HOB slightly elevated    Transfers Overall transfer level: Independent Equipment used: None Transfers: Sit to/from Stand Sit to Stand: Supervision         General transfer comment: mild instability but no LOB  Ambulation/Gait Ambulation/Gait assistance: Min guard;Supervision Gait Distance (Feet): 650 Feet Assistive device: IV Pole;None Gait Pattern/deviations: Step-through pattern;Decreased stride length;Drifts right/left;Narrow base of support;Wide base of support Gait velocity: decr   General Gait Details: Pt with use of IV pole for ~141f with supervision for safety and normal gait speed. Additional 500' sans UE support on IV pole and pt required guard for safety 2* increased instability but still with no overt LOB.  Noted improvement in stability with increased distance ambulated  Stairs            Wheelchair Mobility    Modified Rankin (Stroke  Patients Only)       Balance Overall balance assessment: Independent;Needs assistance Sitting-balance support: Feet supported Sitting balance-Leahy Scale: Normal     Standing balance support: No upper extremity supported Standing balance-Leahy Scale: Good                               Pertinent Vitals/Pain Pain Assessment: Faces Faces Pain Scale: Hurts a little bit Pain Location: abdomen/indigestion Pain Descriptors / Indicators: Aching Pain Intervention(s): Monitored during session    Home Living Family/patient expects to be discharged to:: Private residence Living Arrangements: Other (Comment) (roomate and roomates 3 children - pt owns house)   Type of Home: House Home Access: Stairs to enter Entrance Stairs-Rails: Right Entrance Stairs-Number of Steps: 6 Home Layout: One level Home Equipment: Walker - 4 wheels      Prior Function Level of Independence: Independent         Comments: Pt reports independence with all ADLS and IADLS, likes to do yard work, drives, does her own shopping.     Hand Dominance   Dominant Hand: Right    Extremity/Trunk Assessment   Upper Extremity Assessment Upper Extremity Assessment: Overall WFL for tasks assessed    Lower Extremity Assessment Lower Extremity Assessment: Overall WFL for tasks assessed    Cervical / Trunk Assessment Cervical / Trunk Assessment: Normal  Communication   Communication: No difficulties  Cognition Arousal/Alertness: Awake/alert Behavior During Therapy: WFL for tasks assessed/performed Overall Cognitive Status: Within Functional Limits for tasks assessed  General Comments      Exercises     Assessment/Plan    PT Assessment Patient needs continued PT services  PT Problem List Decreased balance;Decreased mobility       PT Treatment Interventions Gait training;Stair training;Functional mobility training;Therapeutic  activities;Therapeutic exercise;Balance training;Patient/family education    PT Goals (Current goals can be found in the Care Plan section)  Acute Rehab PT Goals Patient Stated Goal: Regain IND PT Goal Formulation: With patient Time For Goal Achievement: 07/26/21 Potential to Achieve Goals: Good    Frequency Min 3X/week   Barriers to discharge        Co-evaluation               AM-PAC PT "6 Clicks" Mobility  Outcome Measure Help needed turning from your back to your side while in a flat bed without using bedrails?: None Help needed moving from lying on your back to sitting on the side of a flat bed without using bedrails?: None Help needed moving to and from a bed to a chair (including a wheelchair)?: A Little Help needed standing up from a chair using your arms (e.g., wheelchair or bedside chair)?: A Little Help needed to walk in hospital room?: A Little Help needed climbing 3-5 steps with a railing? : A Little 6 Click Score: 20    End of Session Equipment Utilized During Treatment: Gait belt Activity Tolerance: Patient tolerated treatment well Patient left: in chair;with call bell/phone within reach Nurse Communication: Mobility status PT Visit Diagnosis: Unsteadiness on feet (R26.81)    Time: 5498-2641 PT Time Calculation (min) (ACUTE ONLY): 22 min   Charges:   PT Evaluation $PT Eval Low Complexity: 1 Low          [place] Pager [phone] Office [phone]   Brooklee Michelin 07/12/2021, 5:32 PM

## 2021-07-12 NOTE — Progress Notes (Signed)
Last suture removed on old R. CV line site. Site clean and dry. Dressing removed.

## 2021-07-12 NOTE — Consult Note (Signed)
[facility] Consult   Reason for Consult:  Patient with depression, anxiety worsened since ostomy creation for Crohn's 02/2021. Decreased PO intake, struggling with adjustment. Also +cocaine on UDS. Referring Physician:  Dr. 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 female patient admitted with Abdominal pain, nausea, vomiting and diarrhea.  Patient is seen and assessed by this nurse practitioner.  She is alert and oriented, calm and cooperative, very pleasant.  She shows much willingness to engage in his open about her past psychiatric history.  Patient also admits to intermittent cocaine use, suggesting that it would be more if she could afford it.  She further admits that she is exhibiting some depressive symptoms, " however is not enough that I want to hurt myself or end my life.  I am not that depressed."  She identifies some depressive symptoms to include poor appetite, sadness, and isolation at times.  She is currently taking Effexor 300 mg p.o. daily, currently prescribed by her primary care provider.  She denies any current outpatient psychiatric services.  She denies any additional substance abuse, besides what is noted above.  She currently denies suicidal ideations, homicidal ideations, and or auditory or visual hallucinations.  HPI:  60 year old past medical history of  hypertension, GERD, presents depression, anxiety, Crohn's disease s/p resection/ileostomy on 02/2021.  Reported abdominal pain, nausea, vomiting and diarrhea.  Recent hospitalization for similar complain, discharged 06/28/21.  At home her symptoms recurred rapidly over 48 hours, prompting her to come back to the hospital.  On her initial physical examination blood pressure 91/71, heart rate 136, temperature 98, respiratory rate 13, oxygen saturation 100%.  She had dry mucous membranes, lungs are clear to auscultation bilaterally, heart is PT, present, tachycardic, abdomen tender to palpation around the ostomy site, no lower extremity  Past Psychiatric History: Depression and anxiety, currently taking Effexor po daily. She does not have any outpatient psych services  Risk to Self:  Denies Risk to Others:   Denies Prior Inpatient Therapy:   Denies Prior Outpatient Therapy:   Denies  Past Medical History:  Past Medical History:  Diagnosis Date   Anemia 2012   Anxiety    Bronchitis    Crohn's ileitis ([place])    Depression    GERD (gastroesophageal reflux disease)    Headache(784.0)    MIGRAINE   Heart murmur    Hiatal hernia    Hypertension    IBS (irritable bowel syndrome)    Incontinence    MVP (mitral valve prolapse)    RLS (restless legs syndrome)    Tubular adenoma of colon 08/2019    Past Surgical History:  Procedure Laterality Date   ABDOMINAL HYSTERECTOMY  06/17/2011   Procedure: HYSTERECTOMY ABDOMINAL;  Surgeon: Arloa Koh;  Location: [facility];  Service: Gynecology;  Laterality: N/A;  Converted to Abdominal Hysterectomy with lysis of adhesions    ANTERIOR AND POSTERIOR REPAIR  02/25/2012   Procedure: ANTERIOR (CYSTOCELE) AND POSTERIOR REPAIR (RECTOCELE);  Surgeon: Daria Pastures, MD;  Location: [facility];  Service: Gynecology;  Laterality: N/A;  ANterior cystocele repair ONLY   BIOPSY  08/29/2019   Procedure: BIOPSY;  Surgeon: Lavena Bullion, DO;  Location: [facility];   Service: Gastroenterology;;   BLADDER SUSPENSION  02/25/2012   Procedure: TRANSVAGINAL TAPE (TVT) PROCEDURE;  Surgeon: Daria Pastures, MD;  Location: [facility];  Service: Gynecology;  Laterality: N/A;   COLONOSCOPY WITH PROPOFOL N/A 08/29/2019   Procedure: COLONOSCOPY WITH PROPOFOL;  Surgeon: Lavena Bullion, DO;  Location: [facility];  Service: Gastroenterology;  Laterality: N/A;   CYSTOSCOPY  02/25/2012   Procedure: CYSTOSCOPY;  Surgeon: Daria Pastures, MD;  Location: [facility];  Service: Gynecology;  Laterality: N/A;   ILEOSTOMY     KNEE ARTHROSCOPY  2010   LAPAROSCOPIC ASSISTED VAGINAL HYSTERECTOMY  06/17/2011   Procedure: LAPAROSCOPIC ASSISTED VAGINAL HYSTERECTOMY;  Surgeon: Arloa Koh;  Location: [facility];  Service: Gynecology;  Laterality: N/A;  Attempted    POLYPECTOMY  08/29/2019   Procedure: POLYPECTOMY;  Surgeon: Lavena Bullion, DO;  Location: [facility];  Service: Gastroenterology;;   SALPINGOOPHORECTOMY  06/17/2011   Procedure: SALPINGO OOPHERECTOMY;  Surgeon: Arloa Koh;  Location: [facility];  Service: Gynecology;  Laterality: Bilateral;   TONSILLECTOMY     Family History:  Family History  Problem Relation Age of Onset   Diabetes Mother    Dementia Mother    Clotting disorder Father    Macular degeneration Father    Crohn's disease Sister    Colon cancer Neg Hx    Esophageal cancer Neg Hx    Pancreatic cancer Neg Hx    Stomach cancer Neg Hx    Liver disease Neg Hx    Family Psychiatric  History: Denies Social History:  Social History   Substance and Sexual Activity  Alcohol Use Not Currently     Social History   Substance and Sexual Activity  Drug Use Not Currently   Types: Cocaine   Comment: clean x 8 weeks    Social History   Socioeconomic History   Marital status: Widowed    Spouse name: Not on file   Number of children: 1   Years of education: Not on file   Highest education level: Not on file  Occupational History   Occupation: Customer service manager: [employer]  Tobacco Use   Smoking status: Some Days    Packs/day: 0.00    Types: Cigarettes   Smokeless tobacco: Never   Tobacco comments:    none in 2 weeks as of 12/04/20  Vaping Use   Vaping Use: Former  Substance and Sexual Activity   Alcohol use: Not Currently   Drug use: Not Currently    Types: Cocaine    Comment: clean x 8 weeks   Sexual activity: Not Currently  Other Topics Concern   Not on file  Social History Narrative   Lives with boyfriend who she states is an alcoholic   Social Determinants of Radio broadcast assistant Strain: Not on file  Food Insecurity: Not on file  Transportation Needs: Not on file  Physical Activity: Not on file  Stress: Not on file  Social Connections: Not on file   Additional Social History:    Allergies:   Allergies  Allergen Reactions   Iodine Other (See Comments)    REACTION: in eye gtts Iodine eye drops; pt  has no reactions to CT contrast    Labs:  Results for orders placed or performed during the hospital encounter of 07/09/21 (from the past 48 hour(s))  Urine rapid drug screen (hosp performed)     Status: Abnormal   Collection Time: 07/10/21  4:10 PM  Result Value Ref Range   Opiates NONE DETECTED NONE DETECTED   Cocaine POSITIVE (A) NONE DETECTED   Benzodiazepines NONE DETECTED NONE DETECTED   Amphetamines NONE DETECTED NONE DETECTED   Tetrahydrocannabinol NONE DETECTED NONE DETECTED   Barbiturates NONE DETECTED NONE DETECTED    Comment: (NOTE) DRUG SCREEN FOR MEDICAL PURPOSES ONLY.  IF CONFIRMATION IS NEEDED FOR ANY PURPOSE, NOTIFY LAB WITHIN 5 DAYS.  LOWEST DETECTABLE LIMITS FOR URINE DRUG SCREEN Drug Class                     Cutoff (ng/mL) Amphetamine and metabolites    1000 Barbiturate and metabolites    200 Benzodiazepine                 161 Tricyclics and metabolites     300 Opiates and metabolites        300 Cocaine and metabolites        300 THC                             50 Performed at [facility], [place] [address] [place], [place] [address]   CBC     Status: Abnormal   Collection Time: 07/10/21  6:37 PM  Result Value Ref Range   WBC 5.6 4.0 - 10.5 K/uL   RBC 2.69 (L) 3.87 - 5.11 MIL/uL   Hemoglobin 7.9 (L) 12.0 - 15.0 g/dL   HCT 23.2 (L) 36.0 - 46.0 %   MCV 86.2 80.0 - 100.0 fL   MCH 29.4 26.0 - 34.0 pg   MCHC 34.1 30.0 - 36.0 g/dL   RDW 12.2 11.5 - 15.5 %   Platelets 244 150 - 400 K/uL   nRBC 0.0 0.0 - 0.2 %    Comment: Performed at [facility], [place] [address], [place], [place] [address]  Comprehensive metabolic panel     Status: Abnormal   Collection Time: 07/10/21  6:37 PM  Result Value Ref Range   Sodium 123 (L) 135 - 145 mmol/L   Potassium 4.2 3.5 - 5.1 mmol/L   Chloride 95 (L) 98 - 111 mmol/L   CO2 21 (L) 22 - 32 mmol/L   Glucose, Bld 98 70 - 99 mg/dL    Comment: Glucose reference range applies only to samples taken after fasting for at least 8 hours.   BUN 58 (H) 8 - 23 mg/dL   Creatinine, Ser 2.90 (H) 0.44 - 1.00 mg/dL    Comment: DELTA CHECK NOTED   Calcium 7.1 (L) 8.9 - 10.3 mg/dL   Total Protein 4.5 (L) 6.5 - 8.1 g/dL   Albumin 2.4 (L) 3.5 - 5.0 g/dL   AST 17 15 - 41 U/L   ALT 15 0 - 44 U/L   Alkaline Phosphatase 39 38 - 126 U/L   Total Bilirubin 0.6 0.3 - 1.2 mg/dL   GFR, Estimated 18 (L) >60 mL/min    Comment: (NOTE) Calculated using the CKD-EPI Creatinine Equation (2021)    Anion gap 7 5 - 15    Comment: Performed at [facility], [place] [address] [place],  [address]  Lactic acid, plasma     Status: None   Collection Time: 07/10/21  6:37 PM  Result Value Ref Range   Lactic Acid, Venous 1.1 0.5 - 1.9 mmol/L    Comment: Performed at [facility], [place] [address], [place],  [address]  Procalcitonin - Baseline     Status: None   Collection Time: 07/10/21  6:37 PM  Result Value Ref Range   Procalcitonin <0.10 ng/mL    Comment:         Interpretation: PCT (Procalcitonin) <= 0.5 ng/mL: Systemic infection (sepsis) is not likely. Local bacterial infection is possible. (NOTE)       Sepsis PCT Algorithm           Lower Respiratory Tract                                      Infection PCT Algorithm    ----------------------------     ----------------------------         PCT < 0.25 ng/mL                PCT < 0.10 ng/mL          Strongly encourage             Strongly discourage   discontinuation of antibiotics    initiation of antibiotics    ----------------------------     -----------------------------       PCT 0.25 - 0.50 ng/mL            PCT 0.10 - 0.25 ng/mL               OR       >80% decrease in PCT            Discourage initiation of                                            antibiotics      Encourage discontinuation           of antibiotics    ----------------------------     -----------------------------         PCT >= 0.50 ng/mL              PCT 0.26 - 0.50 ng/mL               AND        <80% decrease in PCT             Encourage initiation of                                             antibiotics       Encourage continuation           of antibiotics    ----------------------------     -----------------------------        PCT >= 0.50 ng/mL                  PCT > 0.50 ng/mL               AND         increase in PCT  Strongly encourage                                      initiation of antibiotics    Strongly encourage escalation           of antibiotics                                     -----------------------------                                           PCT <= 0.25 ng/mL                                                 OR                                        > 80% decrease in PCT                                      Discontinue / Do not initiate                                             antibiotics  Performed at [facility] [address], [place], [place] [address]   Cortisol, Random     Status: None   Collection Time: 07/11/21  4:49 AM  Result Value Ref Range   Cortisol, Plasma 13.8 ug/dL    Comment: (NOTE) AM    6.7 - 22.6 ug/dL PM   <10.0       ug/dL Performed at [facility] [address], [place], [place] [address]   CBC     Status: Abnormal   Collection Time: 07/11/21  4:49 AM  Result Value Ref Range   WBC 5.1 4.0 - 10.5 K/uL   RBC 2.63 (L) 3.87 - 5.11 MIL/uL   Hemoglobin 7.8 (L) 12.0 - 15.0 g/dL   HCT 22.6 (L) 36.0 - 46.0 %   MCV 85.9 80.0 - 100.0 fL   MCH 29.7 26.0 - 34.0 pg   MCHC 34.5 30.0 - 36.0 g/dL   RDW 12.5 11.5 - 15.5 %   Platelets 251 150 - 400 K/uL   nRBC 0.0 0.0 - 0.2 %    Comment: Performed at [facility], [place] [address] [place], [place] [address]  Basic metabolic panel     Status: Abnormal   Collection Time: 07/11/21  4:49 AM  Result Value Ref Range   Sodium 129 (L) 135 - 145 mmol/L   Potassium 4.0 3.5 - 5.1 mmol/L   Chloride 103 98 - 111 mmol/L   CO2 19 (L) 22 - 32 mmol/L   Glucose, Bld 76 70 - 99 mg/dL    Comment: Glucose reference range applies  only to samples taken after fasting for at least 8 hours.   BUN 48 (H) 8 - 23 mg/dL   Creatinine, Ser 2.33 (H) 0.44 - 1.00 mg/dL   Calcium 7.6 (L) 8.9 - 10.3 mg/dL   GFR, Estimated 23 (L) >60 mL/min    Comment: (NOTE) Calculated using the CKD-EPI Creatinine Equation (2021)    Anion gap 7 5 - 15    Comment: Performed at [facility], [place] [address], [place], [place] [address]  Magnesium     Status: None   Collection Time: 07/11/21  4:49 AM  Result Value Ref Range   Magnesium 2.3 1.7 - 2.4 mg/dL    Comment: Performed at [facility], [place] [address], [place], [place] [address]  Phosphorus     Status: None   Collection Time: 07/11/21  4:49 AM  Result Value Ref Range   Phosphorus 4.1 2.5 - 4.6 mg/dL    Comment: Performed at [facility], [place] [address] [place], [place] [address]  Procalcitonin     Status: None   Collection Time: 07/11/21  4:49 AM  Result Value Ref Range   Procalcitonin <0.10 ng/mL    Comment:        Interpretation: PCT (Procalcitonin) <= 0.5 ng/mL: Systemic infection (sepsis) is not likely. Local bacterial infection is possible. (NOTE)       Sepsis PCT Algorithm           Lower Respiratory Tract                                      Infection PCT Algorithm    ----------------------------     ----------------------------         PCT < 0.25 ng/mL                PCT < 0.10 ng/mL          Strongly encourage             Strongly discourage   discontinuation of antibiotics    initiation of antibiotics    ----------------------------     -----------------------------       PCT 0.25 - 0.50 ng/mL            PCT 0.10 - 0.25 ng/mL               OR       >80% decrease in PCT            Discourage initiation of                                            antibiotics      Encourage discontinuation           of antibiotics    ----------------------------     -----------------------------         PCT >= 0.50 ng/mL              PCT 0.26 - 0.50 ng/mL               AND        <80% decrease in PCT             Encourage initiation of  antibiotics       Encourage continuation           of antibiotics    ----------------------------     -----------------------------        PCT >= 0.50 ng/mL                  PCT > 0.50 ng/mL               AND         increase in PCT                  Strongly encourage                                      initiation of antibiotics    Strongly encourage escalation           of antibiotics                                     -----------------------------                                           PCT <= 0.25 ng/mL                                                 OR                                        > 80% decrease in PCT                                       Discontinue / Do not initiate                                             antibiotics  Performed at [facility] [address] [place], [place] [address]   Procalcitonin     Status: None   Collection Time: 07/12/21  2:42 AM  Result Value Ref Range   Procalcitonin <0.10 ng/mL    Comment:        Interpretation: PCT (Procalcitonin) <= 0.5 ng/mL: Systemic infection (sepsis) is not likely. Local bacterial infection is possible. (NOTE)       Sepsis PCT Algorithm           Lower Respiratory Tract                                      Infection PCT Algorithm    ----------------------------     ----------------------------         PCT < 0.25 ng/mL                PCT < 0.10  ng/mL          Strongly encourage             Strongly discourage   discontinuation of antibiotics    initiation of antibiotics    ----------------------------     -----------------------------       PCT 0.25 - 0.50 ng/mL            PCT 0.10 - 0.25 ng/mL               OR       >80% decrease in PCT            Discourage initiation of                                            antibiotics      Encourage discontinuation           of antibiotics    ----------------------------     -----------------------------         PCT >= 0.50 ng/mL              PCT 0.26 - 0.50 ng/mL               AND        <80% decrease in PCT             Encourage initiation of                                             antibiotics       Encourage continuation           of antibiotics    ----------------------------     -----------------------------        PCT >= 0.50 ng/mL                  PCT > 0.50 ng/mL               AND         increase in PCT                  Strongly encourage                                      initiation of antibiotics    Strongly encourage escalation           of antibiotics                                     -----------------------------                                           PCT <= 0.25  ng/mL                                                 OR                                        >  80% decrease in PCT                                      Discontinue / Do not initiate                                             antibiotics  Performed at [facility] [address] [place], [place] [address]   CBC     Status: Abnormal   Collection Time: 07/12/21  2:42 AM  Result Value Ref Range   WBC 5.9 4.0 - 10.5 K/uL   RBC 2.93 (L) 3.87 - 5.11 MIL/uL   Hemoglobin 8.9 (L) 12.0 - 15.0 g/dL   HCT 26.0 (L) 36.0 - 46.0 %   MCV 88.7 80.0 - 100.0 fL   MCH 30.4 26.0 - 34.0 pg   MCHC 34.2 30.0 - 36.0 g/dL   RDW 12.9 11.5 - 15.5 %   Platelets 267 150 - 400 K/uL   nRBC 0.0 0.0 - 0.2 %    Comment: Performed at [facility], [place] [address] [place], [place] [address]  Basic metabolic panel     Status: Abnormal   Collection Time: 07/12/21  2:42 AM  Result Value Ref Range   Sodium 127 (L) 135 - 145 mmol/L   Potassium 4.1 3.5 - 5.1 mmol/L   Chloride 102 98 - 111 mmol/L   CO2 19 (L) 22 - 32 mmol/L   Glucose, Bld 80 70 - 99 mg/dL    Comment: Glucose reference range applies only to samples taken after fasting for at least 8 hours.   BUN 30 (H) 8 - 23 mg/dL   Creatinine, Ser 1.51 (H) 0.44 - 1.00 mg/dL   Calcium 7.8 (L) 8.9 - 10.3 mg/dL   GFR, Estimated 39 (L) >60 mL/min    Comment: (NOTE) Calculated using the CKD-EPI Creatinine Equation (2021)    Anion gap 6 5 - 15    Comment: Performed at [facility], [place] [address] [place], [place] [address]  Magnesium     Status: None   Collection Time: 07/12/21  2:42 AM  Result Value Ref Range   Magnesium 1.8 1.7 - 2.4 mg/dL    Comment: Performed at [facility], [place] [address], [place], [place] [address]  Phosphorus     Status: None   Collection Time: 07/12/21  2:42 AM  Result Value Ref Range   Phosphorus 3.3 2.5 - 4.6 mg/dL    Comment: Performed at [facility], [place] [address] [place], [place] [address]    Current Facility-Administered Medications  Medication Dose Route Frequency Provider Last Rate Last Admin   acetaminophen (TYLENOL) tablet 650 mg  650 mg Oral Q4H PRN Renee Pain, MD   650 mg at 07/12/21 0229   Chlorhexidine Gluconate Cloth 2 % PADS 6 each  6 each Topical Daily Renee Pain, MD   6 each at 07/12/21 0843   feeding supplement (ENSURE ENLIVE / ENSURE PLUS) liquid 237 mL  237 mL Oral TID BM Brand Males, MD   237 mL at 07/12/21 0842   heparin injection 1,000-6,000 Units  1,000-6,000 Units Intracatheter PRN Lestine Mount, PA-C   4,000 Units at 07/10/21 1027   heparin injection 5,000 Units  5,000 Units Subcutaneous Q8H Renee Pain, MD   5,000 Units at 07/12/21 1236   loperamide (IMODIUM) capsule 4 mg  4 mg Oral BID Willia Craze, NP       MEDLINE mouth rinse  15 mL Mouth Rinse BID Brand Males, MD   15 mL at 07/12/21 0842   melatonin tablet 3 mg  3 mg Oral QHS PRN Frederik Pear, MD   3 mg at 07/11/21 2119   multivitamin with minerals tablet 1 tablet  1 tablet Oral Daily Brand Males, MD   1 tablet at 07/12/21 0841   pantoprazole (PROTONIX) EC tablet 40 mg  40 mg Oral Daily Arrien, Jimmy Picket, MD   40 mg at 07/12/21 1236   polycarbophil (FIBERCON) tablet 625 mg  625 mg Oral TID Mauri Pole, MD   625 mg at 07/12/21 0841   pramipexole (MIRAPEX) tablet 1 mg  1 mg Oral Daily Nevada Crane M, PA-C   1 mg at 07/12/21 2876   sodium bicarbonate 150 mEq in dextrose 5 % 1,150 mL infusion   Intravenous Continuous Arrien, Jimmy Picket, MD 100 mL/hr at 07/12/21 1234 New Bag at 07/12/21 1234   venlafaxine XR (EFFEXOR-XR) 24 hr capsule 300 mg  300 mg Oral Q breakfast Nevada Crane M, PA-C   300 mg at 07/12/21 8115    Musculoskeletal: Strength & Muscle Tone: within normal limits Gait & Station: normal Patient leans: N/A  Psychiatric Specialty Exam:  Presentation   General Appearance: Appropriate for Environment; Casual  Eye Contact:Good  Speech:Clear and Coherent; Normal Rate  Speech Volume:Normal  Handedness:Right   Mood and Affect  Mood:Depressed  Affect:Appropriate; Congruent   Thought Process  Thought Processes:Coherent; Linear; Goal Directed  Descriptions of Associations:Intact  Orientation:Full (Time, Place and Person)  Thought Content:Logical  History of Schizophrenia/Schizoaffective disorder:No  Duration of Psychotic Symptoms:Less than six months  Hallucinations:Hallucinations: None  Ideas of Reference:None  Suicidal Thoughts:Suicidal Thoughts: No  Homicidal Thoughts:Homicidal Thoughts: No   Sensorium  Memory:Immediate Good; Recent Good; Remote Good  Judgment:Good  Insight:Good   Executive Functions  Concentration:Good  Attention Span:Good  [place] of Knowledge:Good  Language:Good   Psychomotor Activity  Psychomotor Activity:Psychomotor Activity: Normal   Assets  Assets:Communication Skills; Leisure Time; Desire for Improvement; Physical Health; Financial Resources/Insurance; Social Support; Resilience   Sleep  Sleep:Sleep: Fair   Physical Exam: Physical Exam Vitals and nursing note reviewed.  Constitutional:      Appearance: She is well-developed and normal weight.  Neurological:     Mental Status: She is alert.  Psychiatric:        Attention and Perception: Attention and perception normal.        Mood and Affect: Affect normal. Mood is depressed.        Speech: Speech normal.        Behavior: Behavior normal.        Thought Content: Thought content normal.        Cognition and Memory: Cognition and memory normal.        Judgment: Judgment normal.   Review of Systems  Psychiatric/Behavioral: Negative.    All other systems reviewed and are negative. Blood pressure 103/71, pulse 78, temperature (!) 97.5 F (36.4 C), temperature source Oral, resp. rate 18, height 5' 4"   (1.626 m), weight 51 kg, last menstrual period 06/10/2011, SpO2 100 %. Body mass index is 19.3 kg/m.  Treatment Plan Summary: Plan patient will be psychiatrically cleared.  She is currently taking 300 mg of  Effexor p.o. daily, currently prescribed by her primary care provider.  She further endorses intermittent use of cocaine, and is interested in seeking outpatient rehabilitation programming.  Although patient endorses some depressive symptoms, she is not currently suicidal nor in imminent danger to herself at this time.   -Patient will benefit from substance abuse intensive outpatient programming. Refer to [place]  [facility] outpatient for [facility] -Continue current medications.   Disposition: No evidence of imminent risk to self or others at present.   Patient does not meet criteria for psychiatric inpatient admission. Supportive therapy provided about ongoing stressors. Refer to IOP.  Suella Broad, FNP 07/12/2021 12:39 PM

## 2021-07-12 NOTE — Progress Notes (Signed)
PROGRESS NOTE    Cynthia [name]  YTK:[id number] DOB: 1961-09-19 DOA: 07/09/2021 PCP: Denita Lung, MD    Brief Narrative:  Cynthia [name] was admitted to the hospital with the working diagnosis of hypovolemic shock.   60 year old past medical history of hypertension, GERD, presents depression, anxiety, Crohn's disease s/p resection/ileostomy on 02/2021.  Reported abdominal pain, nausea, vomiting and diarrhea.  Recent hospitalization for similar complain, discharged 06/28/21.  At home her symptoms recurred rapidly over 48 hours, prompting her to come back to the hospital.  On her initial physical examination blood pressure 91/71, heart rate 136, temperature 98, respiratory rate 13, oxygen saturation 100%.  She had dry mucous membranes, lungs are clear to auscultation bilaterally, heart is PT, present, tachycardic, abdomen tender to palpation around the ostomy site, no lower extremity  Sodium 120, potassium 7.5, chloride 98, bicarb 14, glucose 95 BUN 89 creatinine 5.24, white cell count 11.4, hemoglobin 14.9, hematocrit 43.6, platelets 517. SARS COVID-19 negative  CT of the abdomen with small bowel wall thickening in the left upper quadrant, likely jejunal loops with an area of proximal small bowel intussusception.   EKG 101 bpm, left axis deviation, normal intervals, sinus rhythm, 3, aVF, V3-V6, no significant ST segment changes, diffuse peak T waves.   Patient has placed on vasopressors and aggressive volume resuscitation. Patient was placed on empiric antibiotic therapy for possible abdominal wall cellulitis.   Weaned off vasopressors and transferred to [facility] on 09.09.22.   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  Systolic blood pressure has been 90 to 100 mmHg, off vasopressors. She is awake and alert. Documented urine output 360 ml.  Stool output 1250 ml.   No signs of infection, (sepsis ruled out), continue to hold on antibiotic therapy. Continue volume repletion with isotonic solutions.   2. AKI, hyperkalemia, hyponatremia/ non gap metabolic acidosis. Continue volume repletion with isotonic solution at 100  ml per, bicarb infusion with 150 meq Sodium bicarb.  Renal function today with serum cr at 1.50 with K at 4,1 and serum bicarbonate at 100 ml per hr.  Follow up renal function and electrolytes  3. Chron's disease, jejunal intussusception,  Possible crohn's flare, continue with conservative management with fiber and anti motility agents.  Follow with GI for further recommendations regards steroids, cholestyramine and octreotide.  C diff testing from 08/24 was negative.   4. GERD. Continue antiacid therapy.   5. Depression, cocaine use. Continue close monitoring, on venlafaxine and pramipaxole.   6. Severe protein calorie malnutrition. Continue nutritional supplements.   Patient continue to be at high risk for worsening electrolyte abnormalities.   Status is: Inpatient  Remains inpatient appropriate because:Inpatient level of care appropriate due to severity of illness  Dispo: The patient is from: Home              Anticipated d/c is to: Home              Patient currently is not medically stable to d/c.   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    Subjective: Patient with no nausea or vomiting, but continue to have high output on ostomy., no abdominal pain, no chest pain or dyspnea, continue to be very weak and deconditioned   Objective: Vitals:   07/12/21 0500 07/12/21 0600 07/12/21 0700 07/12/21 0800  BP:  103/71    Pulse: 75 75 78   Resp: 12 13 11 18   Temp:    (!) 97.5 F (36.4 C)  TempSrc:    Oral  SpO2: 100% 100% 100%   Weight: 51 kg     Height:        Intake/Output Summary (Last 24 hours) at 07/12/2021 1119 Last data filed at 07/12/2021 1100 Gross per 24 hour  Intake 4417.97 ml  Output 1860 ml  Net 2557.97 ml   Filed Weights   07/10/21 0937 07/11/21 0400 07/12/21 0500  Weight: 43.2 kg 49.4 kg 51 kg    Examination:   General: Not in pain or dyspnea, deconditioned  Neurology: Awake and alert, non focal  E ENT: mild pallor, no icterus, oral mucosa moist Cardiovascular: No JVD. S1-S2 present, rhythmic, no gallops, rubs, or murmurs. No lower extremity edema. Pulmonary: positive breath sounds bilaterally, adequate air movement, no wheezing, rhonchi or rales. Gastrointestinal. Abdomen not distended, ostomy in place, no erythema Skin. No rashes Musculoskeletal: no joint deformities     Data Reviewed: I have personally reviewed following labs and imaging studies  CBC: Recent Labs  Lab 07/09/21 2341 07/10/21 0300 07/10/21 0550 07/10/21 1837 07/11/21 0449 07/12/21 0242  WBC 11.4* 11.8* 11.2* 5.6 5.1 5.9  NEUTROABS 9.2*  --   --   --   --   --   HGB 14.9 10.5* 10.1* 7.9* 7.8* 8.9*  HCT 43.6 30.0* 29.1* 23.2* 22.6* 26.0*  MCV 85.8 84.3 84.6 86.2 85.9 88.7  PLT 517* 429* 424* 244 251 937   Basic Metabolic Panel: Recent Labs  Lab  07/10/21 0300 07/10/21 0443 07/10/21 1837 07/11/21 0449 07/12/21 0242  NA 124* 127* 123* 129* 127*  K 4.2 4.1 4.2 4.0 4.1  CL 85* 91* 95* 103 102  CO2 19* 21* 21* 19* 19*  GLUCOSE 292* 257* 98 76 80  BUN 90* 70* 58* 48* 30*  CREATININE 4.90* 4.37* 2.90* 2.33* 1.51*  CALCIUM 8.8* 8.5* 7.1* 7.6* 7.8*  MG  --  1.3*  --  2.3 1.8  PHOS  --  6.6*  --  4.1 3.3   GFR: Estimated Creatinine Clearance: 31.5 mL/min (A) (by C-G formula based on SCr of 1.51 mg/dL (H)). Liver Function Tests: Recent Labs  Lab 07/09/21 2200 07/10/21 0106 07/10/21 0443 07/10/21 1837  AST 26 22  --  17  ALT 25 19  --  15  ALKPHOS 75 64  --  39  BILITOT 0.7 0.7  --  0.6  PROT 8.5* 7.8  --  4.5*  ALBUMIN 4.4 4.0 3.0* 2.4*   Recent Labs  Lab 07/09/21 2200  LIPASE 116*   No results for input(s): AMMONIA in the  last 168 hours. Coagulation Profile: No results for input(s): INR, PROTIME in the last 168 hours. Cardiac Enzymes: No results for input(s): CKTOTAL, CKMB, CKMBINDEX, TROPONINI in the last 168 hours. BNP (last 3 results) No results for input(s): PROBNP in the last 8760 hours. HbA1C: No results for input(s): HGBA1C in the last 72 hours. CBG: Recent Labs  Lab 07/10/21 0034  GLUCAP 94   Lipid Profile: No results for input(s): CHOL, HDL, LDLCALC, TRIG, CHOLHDL, LDLDIRECT in the last 72 hours. Thyroid Function Tests: No results for input(s): TSH, T4TOTAL, FREET4, T3FREE, THYROIDAB in the last 72 hours. Anemia Panel: No results for input(s): VITAMINB12, FOLATE, FERRITIN, TIBC, IRON, RETICCTPCT in the last 72 hours.    Radiology Studies: I have reviewed all of the imaging during this hospital visit personally     Scheduled Meds:  Chlorhexidine Gluconate Cloth  6 each Topical Daily   feeding supplement  237 mL Oral TID BM   heparin  5,000 Units Subcutaneous Q8H   loperamide  2 mg Oral BID   mouth rinse  15 mL Mouth Rinse BID   multivitamin with minerals  1 tablet Oral Daily    pantoprazole (PROTONIX) IV  40 mg Intravenous QHS   polycarbophil  625 mg Oral TID   pramipexole  1 mg Oral Daily   venlafaxine XR  300 mg Oral Q breakfast   Continuous Infusions:  sodium chloride Stopped (07/12/21 0659)    ceFAZolin (ANCEF) IV 2 g (07/12/21 0957)     LOS: 2 days        Novah Nessel Gerome Apley, MD

## 2021-07-12 NOTE — Progress Notes (Signed)
[name] Progress Note Patient Name: Cynthia [name] DOB: 07/18/1961 MRN: [id number]   Date of Service  07/12/2021  HPI/Events of Note  Serum sodium 127.  eICU Interventions  Continue NS gtt at 100 ml / hour and trend serum sodium.        Kerry Kass Ociel Retherford 07/12/2021, 3:44 AM

## 2021-07-13 DIAGNOSIS — E872 Acidosis: Secondary | ICD-10-CM

## 2021-07-13 LAB — BASIC METABOLIC PANEL
Anion gap: 6 (ref 5–15)
BUN: 19 mg/dL (ref 8–23)
CO2: 23 mmol/L (ref 22–32)
Calcium: 8.3 mg/dL — ABNORMAL LOW (ref 8.9–10.3)
Chloride: 104 mmol/L (ref 98–111)
Creatinine, Ser: 1.11 mg/dL — ABNORMAL HIGH (ref 0.44–1.00)
GFR, Estimated: 57 mL/min — ABNORMAL LOW (ref >60)
Glucose, Bld: 94 mg/dL (ref 70–99)
Potassium: 4.3 mmol/L (ref 3.5–5.1)
Sodium: 133 mmol/L — ABNORMAL LOW (ref 135–145)

## 2021-07-13 MED ORDER — LOPERAMIDE HCL 2 MG PO CAPS
2.0000 mg | ORAL_CAPSULE | Freq: Two times a day (BID) | ORAL | Status: DC
  Administered 2021-07-13 – 2021-07-14 (×3): 2 mg via ORAL
  Filled 2021-07-13 (×3): qty 1

## 2021-07-13 NOTE — Progress Notes (Signed)
PROGRESS NOTE    Cynthia [name]  YIR:[id number] DOB: [birth year] DOA: 07/09/2021 PCP: Denita Lung, MD    Brief Narrative:  Cynthia [name] was admitted to the hospital with the working diagnosis of hypovolemic shock.    60 year old past medical history of hypertension, GERD, presents depression, anxiety, Crohn's disease s/p resection/ileostomy on 02/2021.  Reported abdominal pain, nausea, vomiting and diarrhea.  Recent hospitalization for similar complain, discharged 06/28/21.  At home her symptoms recurred rapidly over 48 hours, prompting her to come back to the hospital.  On her initial physical examination blood pressure 91/71, heart rate 136, temperature 98, respiratory rate 13, oxygen saturation 100%.  She had dry mucous membranes, lungs are clear to auscultation bilaterally, heart is PT, present, tachycardic, abdomen tender to palpation around the ostomy site, no lower extremity   Sodium 120, potassium 7.5, chloride 98, bicarb 14, glucose 95 BUN 89 creatinine 5.24, white cell count 11.4, hemoglobin 14.9, hematocrit 43.6, platelets 517. SARS COVID-19 negative   CT of the abdomen with small bowel wall thickening in the left upper quadrant, likely jejunal loops with an area of proximal small bowel intussusception.    EKG 101 bpm, left axis deviation, normal intervals, sinus rhythm, 3, aVF, V3-V6, no significant ST segment changes, diffuse peak T waves.    Patient has placed on vasopressors and aggressive volume resuscitation. Patient was placed on empiric antibiotic therapy for possible abdominal wall cellulitis.  Cellulitis and sepsis ruled out and antibiotic therapy was discontinued.    Weaned off vasopressors and transferred to [facility] on 09.09.22.   Clinically continue to improve with decrease ostomy output.    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  Significant decrease in ostomy output, documented to be 475 ml, and per patient is more solid.  Not able to get blood work this am, will attempt again this pm.   Patient is tolerating po well, will hold on IV fluids for now, until follow up on bmp.    2. AKI, hyperkalemia, hyponatremia/ non gap metabolic acidosis. Continue volume repletion with isotonic solution at 100  ml per, bicarb infusion with 150 meq Sodium bicarb.  Renal function today with serum cr at 1.50 with K at 4,1 and serum bicarbonate at 100 ml per hr.   Follow up renal function and electrolytes   3. Chron's disease, jejunal intussusception,  C diff testing from 08/24 was negative.  Clinically improving with supportive care, now become less likely this being a Chron's flare.  Continue supportive care with loperamide and polycarbophil Follow with GI recommendations.    4. GERD. On antiacid therapy.    5. Depression, cocaine use. On venlafaxine and pramipaxole.    6. Severe protein calorie malnutrition. Nutritional supplements. Her po intake has improved,.    Status is: Inpatient  Remains inpatient appropriate because:Inpatient level of care appropriate due to severity of illness  Dispo: The patient is from: Home              Anticipated  d/c is to: Home              Patient currently is not medically stable to d/c.   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   Objective: Vitals:   07/12/21 2227 07/13/21 0225 07/13/21 0630 07/13/21 1427  BP: 99/66 113/87 99/62 98/69   Pulse: 87 80 80 92  Resp: 17 17 17 18   Temp: 97.7 F (36.5 C) (!) 97.4 F (36.3 C) 97.6 F (36.4 C) 97.7 F (36.5 C)  TempSrc: Oral Oral Oral Oral  SpO2: 100% 95% 100% 100%  Weight:   53.5 kg   Height:        Intake/Output Summary (Last 24 hours) at 07/13/2021 1458 Last data filed at 07/12/2021 1556 Gross per 24 hour  Intake 335.89 ml  Output --  Net 335.89 ml   Filed Weights   07/11/21 0400 07/12/21 0500 07/13/21 0630  Weight: 49.4 kg 51 kg 53.5 kg    Examination:   General: Not in pain or dyspnea, deconditioned  Neurology: Awake and alert, non focal  E ENT: mild pallor, no icterus, oral mucosa moist Cardiovascular: No JVD. S1-S2 present, rhythmic, no gallops, rubs, or murmurs. No lower extremity edema. Pulmonary: positive breath sounds bilaterally, adequate air movement, no wheezing, rhonchi or rales. Gastrointestinal. Abdomen soft and non tender. Positive ostomy in place.  Skin. No rashes Musculoskeletal: no joint deformities     Data Reviewed: I have personally reviewed following labs and imaging studies  CBC: Recent Labs  Lab 07/09/21 2341 07/10/21 0300 07/10/21 0550 07/10/21 1837 07/11/21 0449 07/12/21 0242  WBC 11.4* 11.8* 11.2* 5.6 5.1 5.9  NEUTROABS 9.2*  --   --   --   --   --   HGB 14.9 10.5* 10.1* 7.9* 7.8* 8.9*  HCT 43.6 30.0* 29.1* 23.2* 22.6* 26.0*  MCV 85.8 84.3 84.6 86.2 85.9 88.7  PLT 517* 429* 424* 244 251 962   Basic Metabolic Panel: Recent Labs  Lab 07/10/21 0300  07/10/21 0443 07/10/21 1837 07/11/21 0449 07/12/21 0242  NA 124* 127* 123* 129* 127*  K 4.2 4.1 4.2 4.0 4.1  CL 85* 91* 95* 103 102  CO2 19* 21* 21* 19* 19*  GLUCOSE 292* 257* 98 76 80  BUN 90* 70* 58* 48* 30*  CREATININE 4.90* 4.37* 2.90* 2.33* 1.51*  CALCIUM 8.8* 8.5* 7.1* 7.6* 7.8*  MG  --  1.3*  --  2.3 1.8  PHOS  --  6.6*  --  4.1 3.3   GFR: Estimated Creatinine Clearance: 33 mL/min (A) (by C-G formula based on SCr of 1.51 mg/dL (H)). Liver Function Tests: Recent Labs  Lab 07/09/21 2200 07/10/21 0106 07/10/21 0443 07/10/21 1837  AST 26 22  --  17  ALT 25 19  --  15  ALKPHOS 75 64  --  39  BILITOT 0.7 0.7  --  0.6  PROT 8.5* 7.8  --  4.5*  ALBUMIN 4.4 4.0 3.0* 2.4*   Recent Labs  Lab 07/09/21 2200  LIPASE 116*   No results for input(s): AMMONIA in the last 168 hours. Coagulation Profile: No results for input(s): INR, PROTIME in the last 168 hours. Cardiac Enzymes: No results for input(s): CKTOTAL, CKMB, CKMBINDEX, TROPONINI in the last 168 hours. BNP (last 3 results) No results for input(s): PROBNP in the last 8760 hours. HbA1C: No results for input(s): HGBA1C in the last 72 hours. CBG: Recent Labs  Lab 07/10/21 0034  GLUCAP 94   Lipid Profile: No results for input(s): CHOL, HDL, LDLCALC, TRIG, CHOLHDL, LDLDIRECT in the last 72 hours. Thyroid Function Tests: No results for input(s): TSH, T4TOTAL, FREET4, T3FREE, THYROIDAB in the last 72 hours. Anemia Panel: No results for input(s): VITAMINB12, FOLATE, FERRITIN, TIBC, IRON, RETICCTPCT in the last 72 hours.    Radiology Studies: I have reviewed all of the imaging during this hospital visit personally     Scheduled Meds:  Chlorhexidine Gluconate Cloth  6 each Topical Daily   feeding supplement  237 mL Oral TID BM   heparin  5,000 Units Subcutaneous Q8H   loperamide  2 mg Oral BID   mouth rinse  15 mL Mouth Rinse BID   multivitamin with minerals  1 tablet Oral Daily   pantoprazole  40 mg Oral  Daily   polycarbophil  625 mg Oral TID   pramipexole  1 mg Oral Daily   venlafaxine XR  300 mg Oral Q breakfast   Continuous Infusions:  sodium bicarbonate 150 mEq in D5W infusion 100 mL/hr at 07/13/21 0739     LOS: 3 days        Shianne Zeiser Gerome Apley, MD

## 2021-07-14 LAB — BASIC METABOLIC PANEL
Anion gap: 7 (ref 5–15)
BUN: 20 mg/dL (ref 8–23)
CO2: 22 mmol/L (ref 22–32)
Calcium: 8.9 mg/dL (ref 8.9–10.3)
Chloride: 107 mmol/L (ref 98–111)
Creatinine, Ser: 1.12 mg/dL — ABNORMAL HIGH (ref 0.44–1.00)
GFR, Estimated: 56 mL/min — ABNORMAL LOW (ref >60)
Glucose, Bld: 62 mg/dL — ABNORMAL LOW (ref 70–99)
Potassium: 4.2 mmol/L (ref 3.5–5.1)
Sodium: 136 mmol/L (ref 135–145)

## 2021-07-14 MED ORDER — LOPERAMIDE HCL 2 MG PO CAPS: 2.0000 mg | ORAL_CAPSULE | Freq: Two times a day (BID) | ORAL | 0 refills | Status: DC

## 2021-07-14 MED ORDER — ADULT MULTIVITAMIN W/MINERALS CH: 1.0000 | ORAL_TABLET | Freq: Every day | ORAL | 0 refills | Status: DC

## 2021-07-14 MED ORDER — ENSURE ENLIVE PO LIQD: 237.0000 mL | Freq: Three times a day (TID) | ORAL | 0 refills | Status: DC

## 2021-07-14 MED ORDER — ESOMEPRAZOLE MAGNESIUM 40 MG PO CPDR: 40.0000 mg | DELAYED_RELEASE_CAPSULE | Freq: Two times a day (BID) | ORAL | 0 refills | Status: DC

## 2021-07-14 MED ORDER — CALCIUM POLYCARBOPHIL 625 MG PO TABS: 625.0000 mg | ORAL_TABLET | Freq: Three times a day (TID) | ORAL | 0 refills | Status: DC

## 2021-07-14 MED ORDER — LOPERAMIDE HCL 2 MG PO CAPS: 2.0000 mg | ORAL_CAPSULE | ORAL | 0 refills | Status: DC | PRN

## 2021-07-14 NOTE — Progress Notes (Signed)
OT Cancellation Note  Patient Details Name: ILEY DEIGNAN MRN: [id number] DOB: 05-13-1961   Cancelled Treatment:    Reason Eval/Treat Not Completed: OT screened, no needs identified, will sign off. Patient evaluated on Friday 9/9 by Occupational Therapy. Chart review and Rn report patient is still ambulating and performing ADLs. OT will sign off.  Doss Cybulski L Dhanush Jokerst 07/14/2021, 7:10 AM

## 2021-07-14 NOTE — TOC Initial Note (Signed)
Transition of Care [facility]) - Initial/Assessment Note    Patient Details  Name: Cynthia [name] MRN: [id number] Date of Birth: 1961-02-28  Transition of [facility]) CM/SW Contact:    Ova Freshwater Phone Number: [phone] 07/14/2021, 11:14 AM  Clinical Narrative:                  Patient presents top WL due to abdominal pain, nausea, vomiting and diarrhea.  Patient lives at home with renters, patient is able to manage most ADLs w/out help, but stated the renters in her home were supposed to assist and they do not.  Patient stated the renters have never paid rent and do no assist with w/ ADLs and would like to know what this CSW can do to assist her with this situation. Patient stated the renters, have note paid rent, although there is a rental agreement.  CSW encouraged the patient to contact the police and ask them what she could do in this situation. Patient verbalized understanding and verbalized she had no other needs.   Expected Discharge Plan: Home/Self Care Barriers to Discharge: No Barriers Identified   Patient Goals and CMS Choice        Expected Discharge Plan and Services Expected Discharge Plan: Home/Self Care In-house Referral: Clinical Social Work     Living arrangements for the past 2 months: Single Family Home Expected Discharge Date: 07/14/21                                    Prior Living Arrangements/Services Living arrangements for the past 2 months: Single Family Home Lives with:: Other (Comment) (Has renters in home) Patient language and need for interpreter reviewed:: Yes        Need for Family Participation in Patient Care: Yes (Comment) Care giver support system in place?: No (comment)   Criminal Activity/Legal Involvement Pertinent to Current Situation/Hospitalization: No - Comment as needed  Activities of Daily Living Home Assistive Devices/Equipment: Eyeglasses, Gilford Rile (specify type) (front wheeled and 4 wheeled walker) ADL  Screening (condition at time of admission) Patient's cognitive ability adequate to safely complete daily activities?: Yes Is the patient deaf or have difficulty hearing?: No Does the patient have difficulty seeing, even when wearing glasses/contacts?: No Does the patient have difficulty concentrating, remembering, or making decisions?: No Patient able to express need for assistance with ADLs?: Yes Does the patient have difficulty dressing or bathing?: No Independently performs ADLs?: Yes (appropriate for developmental age) Does the patient have difficulty walking or climbing stairs?: No Weakness of Legs: None Weakness of Arms/Hands: None  Permission Sought/Granted                  Emotional Assessment Appearance:: Appears stated age Attitude/Demeanor/Rapport: Engaged Affect (typically observed): Overwhelmed Orientation: : Oriented to Self, Oriented to Place, Oriented to  Time, Oriented to Situation Alcohol / Substance Use: Not Applicable Psych Involvement: No (comment)  Admission diagnosis:  Tachycardia [R00.0] Acute renal failure, unspecified acute renal failure type ([place]) [N17.9] Patient Active Problem List   Diagnosis Date Noted   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[place])    Protein-calorie malnutrition, severe 07/11/2021   Hypotension 07/10/2021   High anion gap metabolic acidosis 49/17/9150   Acute renal failure, unspecified acute renal failure type ([place]) 07/10/2021   Shock circulatory (HCC)    Nausea and vomiting 06/26/2021   Abdominal pain 06/26/2021   Sepsis ([place]) 06/26/2021   Sepsis due to  cellulitis ([place]) 04/05/2021   AKI (acute kidney injury) ([place]) 04/05/2021   Hyperkalemia 04/05/2021   Hyponatremia 04/05/2021   Adjustment disorder 03/14/2021   Unspecified severe protein-calorie malnutrition ([place]) 03/13/2021   COVID-19 01/25/2021   Leukocytosis 01/24/2021   Crohn's colitis, with intestinal obstruction ([place]) 01/24/2021   Immunosuppression due to drug therapy for Crohns  disease 01/12/2021   History of rectal polyp 2020 01/12/2021   Anxiety associated with depression 01/12/2021   Hypokalemia 12/04/2020   Cough 11/27/2020   Otalgia of both ears 11/27/2020   Body aches 11/27/2020   Fever 11/27/2020   High risk medication use 11/27/2020   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[place]) 06/27/2020   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   Crohn's ileitis, with intestinal obstruction ([place]) 10/05/2019   Colitis    Rectal polyp    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   RLS (restless legs syndrome) 09/25/2014   Current smoker 09/19/2011   Migraine headache 09/19/2011   Essential hypertension 09/19/2011   Obesity (BMI 30-39.9) 09/19/2011   History of serrated colonic polyp 01/13/2011   Gastroesophageal reflux disease 03/17/2008   MVP (mitral valve prolapse) 03/17/2008   HIATAL HERNIA 05/03/2002   PCP:  Denita Lung, MD Pharmacy:   [id number] [id number] [place]Phone: [phone]: [phone]    Social Determinants of Health (SDOH) Interventions    Readmission Risk Interventions Readmission Risk Prevention Plan 04/09/2021  Transportation Screening Complete  Medication Review ([place] Complete  PCP or Specialist appointment within 3-5 days of discharge Complete  [place] or [place]  SW Recovery Care/Counseling Consult Complete  [place] LionsNot Applicable  Some recent data might be hidden

## 2021-07-14 NOTE — Progress Notes (Signed)
Patient discharged to home with family, discharge instructions reviewed with patient who verbalized understanding. New RX sent with patient.

## 2021-07-14 NOTE — TOC Transition Note (Signed)
Transition of Care [facility]) - CM/SW Discharge Note   Patient Details  Name: ADANELY REYNOSO MRN: [id number] Date of Birth: April 05, 1961  [facility]) CM/SW Contact:  Adelene Amas, [place] Phone Number: 07/14/2021, 11:22 AM   Clinical Narrative:     Patient will d/c home, w/ no home health needs.  Unit RN confirmed she will arrange transportation, when pt is ready.  Final next level of care: Home/Self Care Barriers to Discharge: No Barriers Identified   Patient Goals and CMS Choice        Discharge Placement                       Discharge Plan and Services In-house Referral: Clinical Social Work                                   Social Determinants of Health (SDOH) Interventions     Readmission Risk Interventions Readmission Risk Prevention Plan 04/09/2021  Transportation Screening Complete  Medication Review [employer]) Complete  PCP or Specialist appointment within 3-5 days of discharge Complete  HRI or [place] Complete  SW Recovery Care/Counseling Consult Complete  [place] Not Applicable  Some recent data might be hidden

## 2021-07-14 NOTE — Discharge Summary (Addendum)
Physician Discharge Summary  GIAVONNA PFLUM FMB:[id number] DOB: 1961-01-17 DOA: 07/09/2021  PCP: Denita Lung, MD  Admit date: 07/09/2021 Discharge date: 07/14/2021  Admitted From: Home  Disposition:  home   Recommendations for Outpatient Follow-up and new medication changes:  Follow up with Dr. Redmond School in 7 to 10 days. Continue lomotil and polycarbophil  Patient will follow with GI as outpatient.   Home Health: no   Equipment/Devices: na    Discharge Condition: stable  CODE STATUS: full  Diet recommendation: regular   Brief/Interim Summary: Mrs. Reddix was admitted to the hospital with the working diagnosis of hypovolemic shock, AKI and hyperkalemia-hyponatremia.   60 year old past medical history of hypertension, GERD,depression, anxiety, Crohn's disease s/p resection/ileostomy on 02/2021.  Reported abdominal pain, nausea, vomiting and diarrhea.  Recent hospitalization for similar complain, discharged 06/28/21.  At home her symptoms recurred rapidly over 48 hours, prompting her to come back to the hospital.  On her initial physical examination blood pressure 91/71, heart rate 136, temperature 98, respiratory rate 13, oxygen saturation 100%.  She had dry mucous membranes, lungs are clear to auscultation bilaterally, heart S1 and  S2 present, tachycardic, abdomen tender to palpation around the ostomy site, no lower extremity   Sodium 120, potassium 7.5, chloride 98, bicarb 14, glucose 95 BUN 89 creatinine 5.24, white cell count 11.4, hemoglobin 14.9, hematocrit 43.6, platelets 517. SARS COVID-19 negative   CT of the abdomen with small bowel wall thickening in the left upper quadrant, likely jejunal loops with an area of proximal small bowel intussusception.    EKG 101 bpm, left axis deviation, normal intervals, sinus rhythm, q waves, aVF, V3-V6, no significant ST segment changes, diffuse peak T waves.    Patient was placed on vasopressors and aggressive volume  resuscitation. Patient was placed on empiric antibiotic therapy for possible abdominal wall cellulitis.  Cellulitis and sepsis ruled out and antibiotic therapy was discontinued.   Renal function and electrolytes improved.    Weaned off vasopressors and transferred to [facility] on 09.09.22.    Clinically continue to improve with decrease ostomy output.   Crohn's flare ruled out.  Hypovolemic shock, GI losses, complicated with AKI, hyperkalemia and hyponatremia.  Patient initially admitted to the intensive care unit.  She received aggressive volume repletion along with vasopressors with improvement of her hemodynamics. Received intravenous isotonic saline and isotonic bicarb infusion.  Her ostomy output improved with no further diarrhea. Sepsis and cellulitis were ruled out, antibiotic therapy was discontinued.  2.  Acute kidney injury with hyperkalemia, hyponatremia and normal anion gap metabolic acidosis. Patient had hyperkalemia with EKG changes, peaked T waves.  Hemodynamic support with vasopressors and volume repletion was given with improvement of kidney function and electrolytes.  Her GI losses decreased in her p.o. intake improved.  Her discharge cr is 1,12, K is 4,2 and NA 136, serum bicarbonate 22.   3.  Crohn's disease, acute on chronic dissection.  8/24 patient tested negative for C. difficile. Patient was placed on loperamide and polycarbophil with improvement of her symptoms. Crohn's flare was ruled out.  4.  GERD patient received antiacid with good toleration.  5.  Depression, cocaine use.  No withdrawal symptoms.  No alcoholic intoxication.  Continue venlafaxine and pramipexole. She was advised about avoiding illegal substances. Patient was consulted by psychiatry, no imminent risk to self or others.  Plan to follow-up as an outpatient.  6.  Severe protein calorie malnutrition/ chronic anemia.  Patient received nutritional supplements. Physical therapy recommended no  follow-up. Hgb at discharge is 8,9, with Hct at 26,0, plan to follow up as outpatient, to consider check iron levels.   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   Hyperkalemia   Hyponatremia   Hypotension   High anion gap metabolic acidosis   Acute renal failure, unspecified acute renal failure type ([place])   Shock circulatory (H[facility])   Protein-calorie malnutrition, severe   Ileostomy present [facility])    Discharge Instructions   Allergies as of 07/14/2021       Reactions   Iodine Other (See Comments)   REACTION: in eye gtts Iodine eye drops; pt has no reactions to CT contrast        Medication List     TAKE these medications    acetaminophen 325 MG tablet Commonly known as: TYLENOL Take 650 mg by mouth every 6 (six) hours as needed for mild pain, fever or headache.   esomeprazole 40 MG capsule Commonly known as: NEXIUM Take 1 capsule (40 mg total) by mouth 2 (two) times daily before a meal.   feeding supplement Liqd Take 237 mLs by mouth 3 (three) times daily between meals.   loperamide 2 MG capsule Commonly known as: IMODIUM Take 1 capsule (2 mg total) by mouth 2 (two) times daily. What changed:  when to take this reasons to take this   multivitamin with minerals Tabs tablet Take 1 tablet by mouth daily.   polycarbophil 625 MG tablet Commonly known as: FIBERCON Take 1 tablet (625 mg total) by mouth 3 (three) times daily. What changed: when to take this   pramipexole 1 MG tablet Commonly known as: MIRAPEX Take 1 tablet (1 mg total) by mouth daily.   venlafaxine XR 150 MG 24 hr capsule Commonly known as: EFFEXOR-XR Take 2 capsules (300 mg total) by mouth daily with breakfast.        Allergies  Allergen Reactions   Iodine Other (See Comments)    REACTION: in  eye gtts Iodine eye drops; pt has no reactions to CT contrast    Consultations: Psychiatry Nephrology    Procedures/Studies: CT ABDOMEN PELVIS WO CONTRAST  Result Date: 07/10/2021 CLINICAL DATA:  Abdominal pain and diarrhea.  Renal failure. EXAM: CT ABDOMEN AND PELVIS WITHOUT CONTRAST TECHNIQUE: Multidetector CT imaging of the abdomen and pelvis was performed following the standard protocol without IV contrast. COMPARISON:  06/25/2021 FINDINGS: Lower chest: Unremarkable. Hepatobiliary: No focal abnormality in the liver on this study without intravenous contrast. Tiny calcified gallstone evident. No intrahepatic or extrahepatic biliary dilation. Pancreas: No focal mass lesion. No dilatation of the main duct. No intraparenchymal cyst. No peripancreatic edema. Spleen: No splenomegaly. No focal mass lesion. Adrenals/Urinary Tract: No adrenal nodule or mass. Kidneys unremarkable on noncontrast imaging. No evidence for hydroureter. Foley catheter decompresses the urinary bladder. Stomach/Bowel: Stomach is unremarkable. No gastric wall thickening. No evidence of outlet obstruction. Duodenum is normally positioned as is the ligament of Treitz. Small bowel wall thickening noted in the left upper quadrant, likely jejunal loops with an area jejunal intussusception measuring approximately 5-6 cm in length (axial image 38/2). There is some associated wall thickening and perienteric edema but no dilatation proximal to the level of the intussusception. Right abdominal ileostomy noted. A second stoma to the left of the midline is presumably a mucous fistula. Vascular/Lymphatic: There is  mild atherosclerotic calcification of the abdominal aorta without aneurysm. There is no gastrohepatic or hepatoduodenal ligament lymphadenopathy. No retroperitoneal or mesenteric lymphadenopathy. No pelvic sidewall lymphadenopathy. Reproductive: Hysterectomy.  There is no adnexal mass. Other: No intraperitoneal free fluid.  Musculoskeletal: No worrisome lytic or sclerotic osseous abnormality. IMPRESSION: 1. Small bowel wall thickening in the left upper quadrant, likely jejunal loops with an area of proximal small bowel intussusception measuring approximately 5-6 cm in length. There is some associated wall thickening and perienteric edema in the region of the intussusception but no dilatation proximal to the level of the intussusception. Oral contrast material presumably given for this study is distal to the intussusception in small bowel loops and in the ostomy bag. Given that the intussusception demonstrates wall thickening and perienteric edema, this is not felt to be a transient process in this individual. Close follow-up recommended as lead point lesion in the jejunum can not be excluded. 2. Cholelithiasis. 3. Aortic Atherosclerosis (ICD10-I70.0). Electronically Signed   By: Misty Stanley M.D.   On: 07/10/2021 13:05   CT Abdomen Pelvis Wo Contrast  Result Date: 06/25/2021 CLINICAL DATA:  Diffuse abdominal pain. Vomiting. Small-bowel obstruction in April of 2022 with ileocectomy and ileostomy. EXAM: CT ABDOMEN AND PELVIS WITHOUT CONTRAST TECHNIQUE: Multidetector CT imaging of the abdomen and pelvis was performed following the standard protocol without IV contrast. COMPARISON:  CT 04/05/2021 FINDINGS: Lower chest: No acute airspace disease or pleural effusion. The heart is normal in size. Hepatobiliary: No focal hepatic abnormality. Faint intraluminal density in the gallbladder may represent stones or sludge, similar to prior exam. There is no pericholecystic inflammation. No biliary dilatation. Pancreas: No ductal dilatation or inflammation. Spleen: Normal in size without focal abnormality. Adrenals/Urinary Tract: No adrenal nodule. Slight left adrenal thickening. Slight prominence of the renal pelvises without frank hydronephrosis. No perinephric edema. No urolithiasis. Decompressed ureters. Unremarkable urinary bladder.  Stomach/Bowel: Stomach is partially distended. No obvious gastric inflammation. There is no small bowel obstruction. Occasional fluid-filled loops of small bowel in the pelvis without obvious wall thickening or inflammatory change. Right upper quadrant ostomy. There is also a left upper quadrant transverse colostomy. The in situ distal colon is redundant. Enteric sutures are noted in the sigmoid. No evidence of bowel obstruction or acute bowel inflammation. Vascular/Lymphatic: Normal caliber abdominal aorta. There is no portal venous or mesenteric gas. No bulky abdominopelvic adenopathy. Reproductive: Hysterectomy.  No adnexal mass. Other: No free air or ascites.  No abdominal wall hernia. Musculoskeletal: No acute osseous findings. Stable sclerotic focus in the right acetabulum, likely bone island. Stable degenerative change in the spine. IMPRESSION: 1. Occasional fluid-filled loops of small bowel in the pelvis without evidence of obstruction or inflammation. 2. Right upper quadrant ileostomy.  Left upper quadrant colostomy. 3. Faint intraluminal density in the gallbladder may represent stones or sludge, similar to prior exam. No pericholecystic inflammation. Electronically Signed   By: Keith Rake M.D.   On: 06/25/2021 23:29   DG Chest 2 View  Result Date: 06/25/2021 CLINICAL DATA:  Abdominal pain and vomiting. EXAM: CHEST - 2 VIEW COMPARISON:  Chest x-ray dated April 05, 2021. FINDINGS: The heart size and mediastinal contours are within normal limits. Both lungs are clear. The visualized skeletal structures are unremarkable. IMPRESSION: No active cardiopulmonary disease. Electronically Signed   By: Titus Dubin M.D.   On: 06/25/2021 21:16   US RENAL  Result Date: 07/10/2021 CLINICAL DATA:  Acute kidney injury EXAM: RENAL / URINARY TRACT ULTRASOUND COMPLETE COMPARISON:  None. FINDINGS: Right  Kidney: Renal measurements: 11.5 x 4.5 x 4.7 cm = volume: 127.3 mL. Echogenicity within normal limits. No mass  or hydronephrosis visualized. Left Kidney: Poorly visualized due to bowel gas. Renal measurements: 9 x 5.2 x 4.5 cm = volume: 109.5 mL. Echogenicity within normal limits. No mass or hydronephrosis visualized. Bladder: Partially decompressed by Foley. Appears normal for degree of bladder distention. Other: None. IMPRESSION: No hydronephrosis. Electronically Signed   By: Macy Mis M.D.   On: 07/10/2021 18:27   DG CHEST PORT 1 VIEW  Result Date: 07/10/2021 CLINICAL DATA:  Central line placement. EXAM: PORTABLE CHEST 1 VIEW COMPARISON:  07/09/2021. FINDINGS: Right IJ central venous catheter with tip projecting at the superior cavoatrial junction. No visible pneumothorax on this semi erect radiograph. No consolidation. No pleural effusions. Cardiomediastinal silhouette is within normal limits. IMPRESSION: Right IJ central venous catheter with tip projecting at the superior cavoatrial junction. No visible pneumothorax. Electronically Signed   By: Margaretha Sheffield M.D.   On: 07/10/2021 06:25   DG CHEST PORT 1 VIEW  Result Date: 07/10/2021 Renee Pain, MD     07/10/2021  4:46 AM Central Venous Catheter Insertion Procedure Note AMYLIA COLLAZOS [id number] [birth year] Date:07/10/21 Time:4:45 AM Provider Performing:Seong-Joo Carson Myrtle Procedure: Insertion of Non-tunneled Central Venous [phone]) with US guidance ([id number]) Indication(s) Medication administration and Hemodialysis Consent Risks of the procedure as well as the alternatives and risks of each were explained to the patient and/or caregiver.  Consent for the procedure was obtained and is signed in the bedside chart Anesthesia Topical only with 1% lidocaine Timeout Verified patient identification, verified procedure, site/side was marked, verified correct patient position, special equipment/implants available, medications/allergies/relevant history reviewed, required imaging and test results available. Sterile Technique Maximal sterile technique including  full sterile barrier drape, hand hygiene, sterile gown, sterile gloves, mask, hair covering, sterile ultrasound probe cover (if used). Procedure Description Area of catheter insertion was cleaned with chlorhexidine and draped in sterile fashion.  With real-time ultrasound guidance a HD catheter was placed into the right subclavian vein. Nonpulsatile blood flow and easy flushing noted in all ports.  The catheter was sutured in place and sterile dressing applied. Complications/Tolerance None; patient tolerated the procedure well. Chest X-ray is ordered to verify placement. EBL Minimal Specimen(s) None Renee Pain, MD Board Certified by the ABIM, Pulmonary Diseases & Critical Care Medicine   DG Chest Port 1 View  Result Date: 07/09/2021 CLINICAL DATA:  Tachycardia nausea EXAM: PORTABLE CHEST 1 VIEW COMPARISON:  06/25/2021 FINDINGS: The heart size and mediastinal contours are within normal limits. Both lungs are clear. The visualized skeletal structures are unremarkable. IMPRESSION: No active disease. Electronically Signed   By: Donavan Foil M.D.   On: 07/09/2021 23:39   US Abdomen Limited RUQ (LIVER/GB)  Result Date: 06/26/2021 CLINICAL DATA:  Cholelithiasis. History of hypertension and gastroesophageal reflux disease. EXAM: ULTRASOUND ABDOMEN LIMITED RIGHT UPPER QUADRANT COMPARISON:  CT 06/25/2021 FINDINGS: Gallbladder: Cholelithiasis with stones in the dependent gallbladder, largest measuring 1.5 cm. No gallbladder wall thickening or edema. Murphy's sign is negative. Common bile duct: Diameter: 3 mm, normal Liver: Somewhat limited visualization due to surface bandages but visualized liver appears homogeneous with normal echotexture and without focal lesion. Portal vein is patent on color Doppler imaging with normal direction of blood flow towards the liver. Other: None. IMPRESSION: Cholelithiasis without evidence of acute cholecystitis. Electronically Signed   By: Lucienne Capers M.D.   On: 06/26/2021  01:09     Subjective: Patient is feeling better, no nausea  or vomiting and tolerating po well. Her stools are more formed.   Discharge Exam: Vitals:   07/13/21 2131 07/14/21 0526  BP: 95/70 99/69  Pulse: 77 75  Resp: 18 16  Temp: 97.7 F (36.5 C) 97.7 F (36.5 C)  SpO2: 100% 100%   Vitals:   07/13/21 0630 07/13/21 1427 07/13/21 2131 07/14/21 0526  BP: 99/62 98/69 95/70  99/69  Pulse: 80 92 77 75  Resp: 17 18 18 16   Temp: 97.6 F (36.4 C) 97.7 F (36.5 C) 97.7 F (36.5 C) 97.7 F (36.5 C)  TempSrc: Oral Oral Oral Oral  SpO2: 100% 100% 100% 100%  Weight: 53.5 kg     Height:        General: Not in pain or dyspnea  Neurology: Awake and alert, non focal  E ENT: mild pallor, no icterus, oral mucosa moist Cardiovascular: No JVD. S1-S2 present, rhythmic, no gallops, rubs, or murmurs. No lower extremity edema. Pulmonary: positive breath sounds bilaterally, adequate air movement, no wheezing, rhonchi or rales. Gastrointestinal. Abdomen soft and non tender, ostomy in place.  Skin. No rashes Musculoskeletal: no joint deformities   The results of significant diagnostics from this hospitalization (including imaging, microbiology, ancillary and laboratory) are listed below for reference.     Microbiology: Recent Results (from the past 240 hour(s))  Resp Panel by RT-PCR (Flu A&B, Covid) Nasopharyngeal Swab     Status: None   Collection Time: 07/09/21 11:22 PM   Specimen: Nasopharyngeal Swab; Nasopharyngeal(NP) swabs in vial transport medium  Result Value Ref Range Status   SARS Coronavirus 2 by RT PCR NEGATIVE NEGATIVE Final    Comment: (NOTE) SARS-CoV-2 target nucleic acids are NOT DETECTED.  The SARS-CoV-2 RNA is generally detectable in upper respiratory specimens during the acute phase of infection. The lowest concentration of SARS-CoV-2 viral copies this assay can detect is 138 copies/mL. A negative result does not preclude SARS-Cov-2 infection and should not be used as  the sole basis for treatment or other patient management decisions. A negative result may occur with  improper specimen collection/handling, submission of specimen other than nasopharyngeal swab, presence of viral mutation(s) within the areas targeted by this assay, and inadequate number of viral copies(<138 copies/mL). A negative result must be combined with clinical observations, patient history, and epidemiological information. The expected result is Negative.  Fact Sheet for Patients:  EntrepreneurPulse.com.au  Fact Sheet for Healthcare Providers:  IncredibleEmployment.be  This test is no t yet approved or cleared by the [place] FDA and  has been authorized for detection and/or diagnosis of SARS-CoV-2 by FDA under an Emergency Use Authorization (EUA). This EUA will remain  in effect (meaning this test can be used) for the duration of the COVID-19 declaration under Section 564(b)(1) of the Act, 21 U.S.C.section 360bbb-3(b)(1), unless the authorization is terminated  or revoked sooner.       Influenza A by PCR NEGATIVE NEGATIVE Final   Influenza B by PCR NEGATIVE NEGATIVE Final    Comment: (NOTE) The Xpert Xpress SARS-CoV-2/FLU/RSV plus assay is intended as an aid in the diagnosis of influenza from Nasopharyngeal swab specimens and should not be used as a sole basis for treatment. Nasal washings and aspirates are unacceptable for Xpert Xpress SARS-CoV-2/FLU/RSV testing.  Fact Sheet for Patients: EntrepreneurPulse.com.au  Fact Sheet for Healthcare Providers: IncredibleEmployment.be  This test is not yet approved or cleared by the [place] FDA and has been authorized for detection and/or diagnosis of SARS-CoV-2 by FDA under an Emergency Use Authorization (EUA).  This EUA will remain in effect (meaning this test can be used) for the duration of the COVID-19 declaration under Section 564(b)(1) of  the Act, 21 U.S.C. section 360bbb-3(b)(1), unless the authorization is terminated or revoked.  Performed at [facility], [place] [address], [place], [place] [address]   Urine Culture     Status: None   Collection Time: 07/10/21  3:05 AM   Specimen: Urine, Catheterized  Result Value Ref Range Status   Specimen Description   Final    URINE, CATHETERIZED Performed at [place] [address] [place], [place] [address]    Special Requests   Final    NONE Performed at [facility], [place] [address] [place], [place] [address]    Culture   Final    NO GROWTH Performed at [facility], [place] [address], [place], [place] [address]    Report Status 07/11/2021 FINAL  Final  MRSA Next Gen by PCR, Nasal     Status: None   Collection Time: 07/10/21  4:00 AM   Specimen: Urine, Clean Catch; Nasal Swab  Result Value Ref Range Status   MRSA by PCR Next Gen NOT DETECTED NOT DETECTED Final    Comment: (NOTE) The GeneXpert MRSA Assay (FDA approved for NASAL specimens only), is one component of a comprehensive MRSA colonization surveillance program. It is not intended to diagnose MRSA infection nor to guide or monitor treatment for MRSA infections. Test performance is not FDA approved in patients less than 11 years old. Performed at [facility], [place] [address], [place], [place] [address]   Culture, blood (routine x 2)     Status: None (Preliminary result)   Collection Time: 07/10/21  5:51 AM   Specimen: BLOOD  Result Value Ref Range Status   Specimen Description   Final    BLOOD CENTRAL LINE Performed at [facility], [place] [address], [place], [place] [address]    Special Requests   Final    BOTTLES DRAWN AEROBIC AND ANAEROBIC Blood Culture adequate volume Performed at [place] [address] [place], [place] [address]    Culture   Final    NO GROWTH 3  DAYS Performed at [facility], [place] [address] [place], [place] [address]    Report Status PENDING  Incomplete  Culture, blood (routine x 2)     Status: None (Preliminary result)   Collection Time: 07/10/21  6:12 AM   Specimen: BLOOD  Result Value Ref Range Status   Specimen Description   Final    BLOOD BLOOD RIGHT HAND Performed at [facility] [address] [place], [place] [address]    Special Requests   Final    BOTTLES DRAWN AEROBIC AND ANAEROBIC Blood Culture adequate volume Performed at [facility] [address], [place], [place] [address]    Culture   Final    NO GROWTH 3 DAYS Performed at [facility], [place] [address], [place],  [address]    Report Status PENDING  Incomplete     Labs: BNP (last 3 results) No results for input(s): BNP in the last 8760 hours. Basic Metabolic Panel: Recent Labs  Lab 07/10/21 0443 07/10/21 1837 07/11/21 0449 07/12/21 0242 07/13/21 1851  NA 127* 123* 129* 127* 133*  K 4.1 4.2 4.0 4.1 4.3  CL 91* 95* 103 102 104  CO2 21* 21* 19* 19* 23  GLUCOSE  257* 98 76 80 94  BUN 70* 58* 48* 30* 19  CREATININE 4.37* 2.90* 2.33* 1.51* 1.11*  CALCIUM 8.5* 7.1* 7.6* 7.8* 8.3*  MG 1.3*  --  2.3 1.8  --   PHOS 6.6*  --  4.1 3.3  --    Liver Function Tests: Recent Labs  Lab 07/09/21 2200 07/10/21 0106 07/10/21 0443 07/10/21 1837  AST 26 22  --  17  ALT 25 19  --  15  ALKPHOS 75 64  --  39  BILITOT 0.7 0.7  --  0.6  PROT 8.5* 7.8  --  4.5*  ALBUMIN 4.4 4.0 3.0* 2.4*   Recent Labs  Lab 07/09/21 2200  LIPASE 116*   No results for input(s): AMMONIA in the last 168 hours. CBC: Recent Labs  Lab 07/09/21 2341 07/10/21 0300 07/10/21 0550 07/10/21 1837 07/11/21 0449 07/12/21 0242  WBC 11.4* 11.8* 11.2* 5.6 5.1 5.9  NEUTROABS 9.2*  --   --   --   --   --   HGB 14.9 10.5* 10.1* 7.9* 7.8* 8.9*  HCT 43.6 30.0* 29.1* 23.2* 22.6* 26.0*  MCV 85.8 84.3 84.6 86.2 85.9 88.7  PLT  517* 429* 424* 244 251 267   Cardiac Enzymes: No results for input(s): CKTOTAL, CKMB, CKMBINDEX, TROPONINI in the last 168 hours. BNP: Invalid input(s): POCBNP CBG: Recent Labs  Lab 07/10/21 0034  GLUCAP 94   D-Dimer No results for input(s): DDIMER in the last 72 hours. Hgb A1c No results for input(s): HGBA1C in the last 72 hours. Lipid Profile No results for input(s): CHOL, HDL, LDLCALC, TRIG, CHOLHDL, LDLDIRECT in the last 72 hours. Thyroid function studies No results for input(s): TSH, T4TOTAL, T3FREE, THYROIDAB in the last 72 hours.  Invalid input(s): FREET3 Anemia work up No results for input(s): VITAMINB12, FOLATE, FERRITIN, TIBC, IRON, RETICCTPCT in the last 72 hours. Urinalysis    Component Value Date/Time   COLORURINE YELLOW 07/09/2021 2200   APPEARANCEUR CLEAR 07/09/2021 2200   LABSPEC 1.025 07/09/2021 2200   LABSPEC 1,025 12/18/2020 1644   PHURINE 5.5 07/09/2021 2200   GLUCOSEU NEGATIVE 07/09/2021 2200   HGBUR NEGATIVE 07/09/2021 2200   BILIRUBINUR NEGATIVE 07/09/2021 2200   BILIRUBINUR small (A) 12/18/2020 1644   BILIRUBINUR n 12/04/2011 1014   KETONESUR NEGATIVE 07/09/2021 2200   PROTEINUR NEGATIVE 07/09/2021 2200   UROBILINOGEN negative 12/04/2011 1014   NITRITE NEGATIVE 07/09/2021 2200   LEUKOCYTESUR NEGATIVE 07/09/2021 2200   Sepsis Labs Invalid input(s): PROCALCITONIN,  WBC,  LACTICIDVEN Microbiology Recent Results (from the past 240 hour(s))  Resp Panel by RT-PCR (Flu A&B, Covid) Nasopharyngeal Swab     Status: None   Collection Time: 07/09/21 11:22 PM   Specimen: Nasopharyngeal Swab; Nasopharyngeal(NP) swabs in vial transport medium  Result Value Ref Range Status   SARS Coronavirus 2 by RT PCR NEGATIVE NEGATIVE Final    Comment: (NOTE) SARS-CoV-2 target nucleic acids are NOT DETECTED.  The SARS-CoV-2 RNA is generally detectable in upper respiratory specimens during the acute phase of infection. The lowest concentration of SARS-CoV-2 viral  copies this assay can detect is 138 copies/mL. A negative result does not preclude SARS-Cov-2 infection and should not be used as the sole basis for treatment or other patient management decisions. A negative result may occur with  improper specimen collection/handling, submission of specimen other than nasopharyngeal swab, presence of viral mutation(s) within the areas targeted by this assay, and inadequate number of viral copies(<138 copies/mL). A negative result must be combined with clinical  observations, patient history, and epidemiological information. The expected result is Negative.  Fact Sheet for Patients:  EntrepreneurPulse.com.au  Fact Sheet for Healthcare Providers:  IncredibleEmployment.be  This test is no t yet approved or cleared by the [place] FDA and  has been authorized for detection and/or diagnosis of SARS-CoV-2 by FDA under an Emergency Use Authorization (EUA). This EUA will remain  in effect (meaning this test can be used) for the duration of the COVID-19 declaration under Section 564(b)(1) of the Act, 21 U.S.C.section 360bbb-3(b)(1), unless the authorization is terminated  or revoked sooner.       Influenza A by PCR NEGATIVE NEGATIVE Final   Influenza B by PCR NEGATIVE NEGATIVE Final    Comment: (NOTE) The Xpert Xpress SARS-CoV-2/FLU/RSV plus assay is intended as an aid in the diagnosis of influenza from Nasopharyngeal swab specimens and should not be used as a sole basis for treatment. Nasal washings and aspirates are unacceptable for Xpert Xpress SARS-CoV-2/FLU/RSV testing.  Fact Sheet for Patients: EntrepreneurPulse.com.au  Fact Sheet for Healthcare Providers: IncredibleEmployment.be  This test is not yet approved or cleared by the [place] FDA and has been authorized for detection and/or diagnosis of SARS-CoV-2 by FDA under an Emergency Use Authorization (EUA). This  EUA will remain in effect (meaning this test can be used) for the duration of the COVID-19 declaration under Section 564(b)(1) of the Act, 21 U.S.C. section 360bbb-3(b)(1), unless the authorization is terminated or revoked.  Performed at [facility], [place] [address] [place], [place] [address]   Urine Culture     Status: None   Collection Time: 07/10/21  3:05 AM   Specimen: Urine, Catheterized  Result Value Ref Range Status   Specimen Description   Final    URINE, CATHETERIZED Performed at [facility] [address], [place], [place] [address]    Special Requests   Final    NONE Performed at [facility], [place] [address] [place], [place] [address]    Culture   Final    NO GROWTH Performed at [facility], [place] [address], [place], [place] [address]    Report Status 07/11/2021 FINAL  Final  MRSA Next Gen by PCR, Nasal     Status: None   Collection Time: 07/10/21  4:00 AM   Specimen: Urine, Clean Catch; Nasal Swab  Result Value Ref Range Status   MRSA by PCR Next Gen NOT DETECTED NOT DETECTED Final    Comment: (NOTE) The GeneXpert MRSA Assay (FDA approved for NASAL specimens only), is one component of a comprehensive MRSA colonization surveillance program. It is not intended to diagnose MRSA infection nor to guide or monitor treatment for MRSA infections. Test performance is not FDA approved in patients less than 16 years old. Performed at [facility], [place] [address], [place], [place] [address]   Culture, blood (routine x 2)     Status: None (Preliminary result)   Collection Time: 07/10/21  5:51 AM   Specimen: BLOOD  Result Value Ref Range Status   Specimen Description   Final    BLOOD CENTRAL LINE Performed at [facility], [place] [address] [place],  [address]    Special Requests   Final    BOTTLES DRAWN AEROBIC AND ANAEROBIC Blood Culture adequate  volume Performed at [place] [address] [place],  [address]    Culture   Final    NO GROWTH 3 DAYS Performed at  [facility], [place] [address] [place], [place] [address]    Report Status PENDING  Incomplete  Culture, blood (routine x 2)     Status: None (Preliminary result)   Collection Time: 07/10/21  6:12 AM   Specimen: BLOOD  Result Value Ref Range Status   Specimen Description   Final    BLOOD BLOOD RIGHT HAND Performed at [place] [address] [place], [place] [address]    Special Requests   Final    BOTTLES DRAWN AEROBIC AND ANAEROBIC Blood Culture adequate volume Performed at [place] [address] [place], [place] [address]    Culture   Final    NO GROWTH 3 DAYS Performed at [facility], [place] [address] [place], [place] [address]    Report Status PENDING  Incomplete     Time coordinating discharge: 45 minutes  SIGNED:   Tawni Millers, MD  [facility] 07/14/2021, 9:16 AM

## 2021-07-15 ENCOUNTER — Telehealth: Payer: Self-pay

## 2021-07-15 DIAGNOSIS — K56609 Unspecified intestinal obstruction, unspecified as to partial versus complete obstruction: Secondary | ICD-10-CM

## 2021-07-15 DIAGNOSIS — K50819 Crohn's disease of both small and large intestine with unspecified complications: Secondary | ICD-10-CM

## 2021-07-15 LAB — CULTURE, BLOOD (ROUTINE X 2)
Culture: NO GROWTH
Culture: NO GROWTH
Special Requests: ADEQUATE
Special Requests: ADEQUATE

## 2021-07-15 NOTE — Telephone Encounter (Signed)
Patient has been scheduled for small bowel follow through for 07/18/21 at 11:00.  She is to arrive at [facility] at 10:45 in radiology and be NPO after MN.  Her phone answers after 2 rings then have a fast busy signal.  I will continue to try and reach the patient

## 2021-07-15 NOTE — Telephone Encounter (Signed)
-----   Message from Mauri Pole, MD sent at 07/13/2021 11:54 AM EDT ----- Cynthia [name],  She will need office f/u visit with Dr Fuller Plan or APP.  She will need small bowel barium study to further evaluate ?jejunal intussusception. I have ordered it as inpatient but its not done during weekend.  We will have to schedule it as outpatient as she seems to be doing better and will likely go home this weekend.  Thanks VN

## 2021-07-16 ENCOUNTER — Inpatient Hospital Stay: Payer: 59 | Admitting: Family Medicine

## 2021-07-16 NOTE — Telephone Encounter (Signed)
Patient's phone still not in service. I sent a MyV+Chart message

## 2021-07-17 NOTE — Telephone Encounter (Signed)
SECOND ATTEMPT:  Called pt to inform about recommendation(s) below as well as scheduled appt. Unable to reach. Phone rung x1, then began beeping repeatedly.

## 2021-07-18 ENCOUNTER — Ambulatory Visit (HOSPITAL_COMMUNITY): Admission: RE | Admit: 2021-07-18 | Payer: 59 | Source: Ambulatory Visit

## 2021-07-18 NOTE — Telephone Encounter (Signed)
See Mychart message from today.  Patient will call and reschedule her appointment. Her cell phone is not working at this time.

## 2021-07-19 ENCOUNTER — Encounter: Payer: Self-pay | Admitting: Family Medicine

## 2021-07-19 ENCOUNTER — Other Ambulatory Visit: Payer: Self-pay

## 2021-07-19 ENCOUNTER — Ambulatory Visit (INDEPENDENT_AMBULATORY_CARE_PROVIDER_SITE_OTHER): Payer: 59 | Admitting: Family Medicine

## 2021-07-19 VITALS — BP 94/62 | HR 100 | Temp 96.9°F | Wt 113.6 lb

## 2021-07-19 DIAGNOSIS — K50112 Crohn's disease of large intestine with intestinal obstruction: Secondary | ICD-10-CM | POA: Diagnosis not present

## 2021-07-19 DIAGNOSIS — K50013 Crohn's disease of small intestine with fistula: Secondary | ICD-10-CM | POA: Diagnosis not present

## 2021-07-19 DIAGNOSIS — D84821 Immunodeficiency due to drugs: Secondary | ICD-10-CM | POA: Diagnosis not present

## 2021-07-19 DIAGNOSIS — E43 Unspecified severe protein-calorie malnutrition: Secondary | ICD-10-CM

## 2021-07-19 DIAGNOSIS — G2581 Restless legs syndrome: Secondary | ICD-10-CM | POA: Diagnosis not present

## 2021-07-19 DIAGNOSIS — Z79899 Other long term (current) drug therapy: Secondary | ICD-10-CM

## 2021-07-19 NOTE — Progress Notes (Signed)
   Subjective:    Patient ID: Cynthia [name], female    DOB: September 28, 1961, 60 y.o.   MRN: [id number]  HPI She is here for hospital visit follow-up.  She was recently hospitalized September 6 to the 11th.  She continues have a great deal difficulty with dealing with her Crohn's colitis she has had a stoma since April.  This seems to be working well.  Since leaving the hospital she has had only 1 episode of vomiting.  She continues on Nexium, Mirapex and Effexor.  She has not started on Imodium.  She states that her urine is coming out clear although she is concerned about hydration. She also has concerns about a woman who has her daughter living there with them and apparently this woman according to her is using drugs.  They have been paying their bills.  They are apparently 2 months behind in rent. She wants a refill on her Mirapex.  Review of Systems     Objective:   Physical Exam Alert and in no distress.  Cardiac exam shows regular rhythm without murmurs or gallops.  Lungs are clear to auscultation.  Abdominal exam shows the pouch to be in place.  No surrounding erythema is noted.       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  Continue on present medication regimen.  Continue to follow-up with gastroenterology. Recommend she call legal aid to find out what she can to to legally get this person and her child out of the house.

## 2021-07-20 LAB — CBC WITH DIFFERENTIAL/PLATELET
Basophils Absolute: 0 10*3/uL (ref 0.0–0.2)
Basos: 0 %
EOS (ABSOLUTE): 0.2 10*3/uL (ref 0.0–0.4)
Eos: 2 %
Hematocrit: 27.3 % — ABNORMAL LOW (ref 34.0–46.6)
Hemoglobin: 8.8 g/dL — ABNORMAL LOW (ref 11.1–15.9)
Immature Grans (Abs): 0.2 10*3/uL — ABNORMAL HIGH (ref 0.0–0.1)
Immature Granulocytes: 2 %
Lymphocytes Absolute: 2.1 10*3/uL (ref 0.7–3.1)
Lymphs: 20 %
MCH: 29.2 pg (ref 26.6–33.0)
MCHC: 32.2 g/dL (ref 31.5–35.7)
MCV: 91 fL (ref 79–97)
Monocytes Absolute: 0.7 10*3/uL (ref 0.1–0.9)
Monocytes: 7 %
Neutrophils Absolute: 7.5 10*3/uL — ABNORMAL HIGH (ref 1.4–7.0)
Neutrophils: 69 %
Platelets: 360 10*3/uL (ref 150–450)
RBC: 3.01 x10E6/uL — ABNORMAL LOW (ref 3.77–5.28)
RDW: 12.5 % (ref 11.7–15.4)
WBC: 10.7 10*3/uL (ref 3.4–10.8)

## 2021-07-20 LAB — COMPREHENSIVE METABOLIC PANEL
ALT: 12 IU/L (ref 0–32)
AST: 14 IU/L (ref 0–40)
Albumin/Globulin Ratio: 1.6 (ref 1.2–2.2)
Albumin: 3.4 g/dL — ABNORMAL LOW (ref 3.8–4.8)
Alkaline Phosphatase: 47 IU/L (ref 44–121)
BUN/Creatinine Ratio: 16 (ref 12–28)
BUN: 25 mg/dL (ref 8–27)
Bilirubin Total: <0.2 mg/dL (ref 0.0–1.2)
CO2: 19 mmol/L — ABNORMAL LOW (ref 20–29)
Calcium: 9.4 mg/dL (ref 8.7–10.3)
Chloride: 104 mmol/L (ref 96–106)
Creatinine, Ser: 1.53 mg/dL — ABNORMAL HIGH (ref 0.57–1.00)
Globulin, Total: 2.1 g/dL (ref 1.5–4.5)
Glucose: 75 mg/dL (ref 65–99)
Potassium: 5.6 mmol/L — ABNORMAL HIGH (ref 3.5–5.2)
Sodium: 136 mmol/L (ref 134–144)
Total Protein: 5.5 g/dL — ABNORMAL LOW (ref 6.0–8.5)
eGFR: 38 mL/min/{1.73_m2} — ABNORMAL LOW (ref >59)

## 2021-07-29 ENCOUNTER — Other Ambulatory Visit (HOSPITAL_COMMUNITY): Payer: 59

## 2021-08-07 ENCOUNTER — Ambulatory Visit: Payer: 59 | Admitting: Gastroenterology

## 2021-08-12 ENCOUNTER — Inpatient Hospital Stay (HOSPITAL_COMMUNITY)
Admission: EM | Admit: 2021-08-12 | Discharge: 2021-08-20 | DRG: 682 | Disposition: A | Discharge status: Home Health | Payer: Medicaid Other | Attending: Internal Medicine | Admitting: Internal Medicine

## 2021-08-12 ENCOUNTER — Other Ambulatory Visit: Payer: Self-pay

## 2021-08-12 ENCOUNTER — Emergency Department (HOSPITAL_COMMUNITY): Payer: Medicaid Other

## 2021-08-12 ENCOUNTER — Encounter (HOSPITAL_COMMUNITY): Payer: Self-pay

## 2021-08-12 DIAGNOSIS — Z79899 Other long term (current) drug therapy: Secondary | ICD-10-CM

## 2021-08-12 DIAGNOSIS — N179 Acute kidney failure, unspecified: Secondary | ICD-10-CM | POA: Diagnosis not present

## 2021-08-12 DIAGNOSIS — Z20822 Contact with and (suspected) exposure to covid-19: Secondary | ICD-10-CM | POA: Diagnosis present

## 2021-08-12 DIAGNOSIS — F418 Other specified anxiety disorders: Secondary | ICD-10-CM | POA: Diagnosis not present

## 2021-08-12 DIAGNOSIS — E871 Hypo-osmolality and hyponatremia: Secondary | ICD-10-CM | POA: Diagnosis present

## 2021-08-12 DIAGNOSIS — Z8616 Personal history of COVID-19: Secondary | ICD-10-CM

## 2021-08-12 DIAGNOSIS — K219 Gastro-esophageal reflux disease without esophagitis: Secondary | ICD-10-CM | POA: Diagnosis present

## 2021-08-12 DIAGNOSIS — F17210 Nicotine dependence, cigarettes, uncomplicated: Secondary | ICD-10-CM | POA: Diagnosis present

## 2021-08-12 DIAGNOSIS — E8729 Other acidosis: Secondary | ICD-10-CM | POA: Diagnosis present

## 2021-08-12 DIAGNOSIS — Z932 Ileostomy status: Secondary | ICD-10-CM

## 2021-08-12 DIAGNOSIS — Z681 Body mass index (BMI) 19 or less, adult: Secondary | ICD-10-CM

## 2021-08-12 DIAGNOSIS — Z91048 Other nonmedicinal substance allergy status: Secondary | ICD-10-CM

## 2021-08-12 DIAGNOSIS — E861 Hypovolemia: Secondary | ICD-10-CM | POA: Diagnosis present

## 2021-08-12 DIAGNOSIS — Z86010 Personal history of colonic polyps: Secondary | ICD-10-CM

## 2021-08-12 DIAGNOSIS — E872 Acidosis, unspecified: Secondary | ICD-10-CM | POA: Diagnosis present

## 2021-08-12 DIAGNOSIS — E860 Dehydration: Secondary | ICD-10-CM | POA: Diagnosis present

## 2021-08-12 DIAGNOSIS — L03311 Cellulitis of abdominal wall: Secondary | ICD-10-CM | POA: Diagnosis present

## 2021-08-12 DIAGNOSIS — G9341 Metabolic encephalopathy: Secondary | ICD-10-CM | POA: Diagnosis present

## 2021-08-12 DIAGNOSIS — E43 Unspecified severe protein-calorie malnutrition: Secondary | ICD-10-CM | POA: Diagnosis present

## 2021-08-12 DIAGNOSIS — K50013 Crohn's disease of small intestine with fistula: Secondary | ICD-10-CM | POA: Diagnosis present

## 2021-08-12 DIAGNOSIS — Z90710 Acquired absence of both cervix and uterus: Secondary | ICD-10-CM

## 2021-08-12 DIAGNOSIS — I959 Hypotension, unspecified: Secondary | ICD-10-CM | POA: Diagnosis present

## 2021-08-12 DIAGNOSIS — E876 Hypokalemia: Secondary | ICD-10-CM | POA: Diagnosis present

## 2021-08-12 DIAGNOSIS — G2581 Restless legs syndrome: Secondary | ICD-10-CM | POA: Diagnosis present

## 2021-08-12 DIAGNOSIS — I10 Essential (primary) hypertension: Secondary | ICD-10-CM | POA: Diagnosis present

## 2021-08-12 DIAGNOSIS — Z9049 Acquired absence of other specified parts of digestive tract: Secondary | ICD-10-CM

## 2021-08-12 LAB — I-STAT CHEM 8, ED
BUN: 128 mg/dL — ABNORMAL HIGH (ref 8–23)
Calcium, Ion: 1.05 mmol/L — ABNORMAL LOW (ref 1.15–1.40)
Chloride: 83 mmol/L — ABNORMAL LOW (ref 98–111)
Creatinine, Ser: 3.3 mg/dL — ABNORMAL HIGH (ref 0.44–1.00)
Glucose, Bld: 209 mg/dL — ABNORMAL HIGH (ref 70–99)
HCT: 41 % (ref 36.0–46.0)
Hemoglobin: 13.9 g/dL (ref 12.0–15.0)
Potassium: 2.9 mmol/L — ABNORMAL LOW (ref 3.5–5.1)
Sodium: 120 mmol/L — ABNORMAL LOW (ref 135–145)
TCO2: 21 mmol/L — ABNORMAL LOW (ref 22–32)

## 2021-08-12 LAB — URINALYSIS, ROUTINE W REFLEX MICROSCOPIC
Bilirubin Urine: NEGATIVE
Glucose, UA: 50 mg/dL — AB
Hgb urine dipstick: NEGATIVE
Ketones, ur: NEGATIVE mg/dL
Leukocytes,Ua: NEGATIVE
Nitrite: NEGATIVE
Protein, ur: NEGATIVE mg/dL
Specific Gravity, Urine: 1.013 (ref 1.005–1.030)
pH: 5 (ref 5.0–8.0)

## 2021-08-12 LAB — CBC WITH DIFFERENTIAL/PLATELET
Abs Immature Granulocytes: 0.11 10*3/uL — ABNORMAL HIGH (ref 0.00–0.07)
Basophils Absolute: 0 10*3/uL (ref 0.0–0.1)
Basophils Relative: 0 %
Eosinophils Absolute: 0 10*3/uL (ref 0.0–0.5)
Eosinophils Relative: 0 %
HCT: 37 % (ref 36.0–46.0)
Hemoglobin: 13.3 g/dL (ref 12.0–15.0)
Immature Granulocytes: 1 %
Lymphocytes Relative: 11 %
Lymphs Abs: 1.5 10*3/uL (ref 0.7–4.0)
MCH: 29.7 pg (ref 26.0–34.0)
MCHC: 35.9 g/dL (ref 30.0–36.0)
MCV: 82.6 fL (ref 80.0–100.0)
Monocytes Absolute: 0.4 10*3/uL (ref 0.1–1.0)
Monocytes Relative: 3 %
Neutro Abs: 11.6 10*3/uL — ABNORMAL HIGH (ref 1.7–7.7)
Neutrophils Relative %: 85 %
Platelets: 350 10*3/uL (ref 150–400)
RBC: 4.48 MIL/uL (ref 3.87–5.11)
RDW: 12.4 % (ref 11.5–15.5)
WBC: 13.7 10*3/uL — ABNORMAL HIGH (ref 4.0–10.5)
nRBC: 0 % (ref 0.0–0.2)

## 2021-08-12 LAB — BASIC METABOLIC PANEL
Anion gap: 14 (ref 5–15)
BUN: 97 mg/dL — ABNORMAL HIGH (ref 8–23)
CO2: 18 mmol/L — ABNORMAL LOW (ref 22–32)
Calcium: 8.8 mg/dL — ABNORMAL LOW (ref 8.9–10.3)
Chloride: 91 mmol/L — ABNORMAL LOW (ref 98–111)
Creatinine, Ser: 2.59 mg/dL — ABNORMAL HIGH (ref 0.44–1.00)
GFR, Estimated: 20 mL/min — ABNORMAL LOW (ref >60)
Glucose, Bld: 95 mg/dL (ref 70–99)
Potassium: 3.2 mmol/L — ABNORMAL LOW (ref 3.5–5.1)
Sodium: 123 mmol/L — ABNORMAL LOW (ref 135–145)

## 2021-08-12 LAB — COMPREHENSIVE METABOLIC PANEL
ALT: 14 U/L (ref 0–44)
AST: 21 U/L (ref 15–41)
Albumin: 4 g/dL (ref 3.5–5.0)
Alkaline Phosphatase: 58 U/L (ref 38–126)
Anion gap: 17 — ABNORMAL HIGH (ref 5–15)
BUN: 110 mg/dL — ABNORMAL HIGH (ref 8–23)
CO2: 21 mmol/L — ABNORMAL LOW (ref 22–32)
Calcium: 9.4 mg/dL (ref 8.9–10.3)
Chloride: 83 mmol/L — ABNORMAL LOW (ref 98–111)
Creatinine, Ser: 3.16 mg/dL — ABNORMAL HIGH (ref 0.44–1.00)
GFR, Estimated: 16 mL/min — ABNORMAL LOW (ref >60)
Glucose, Bld: 211 mg/dL — ABNORMAL HIGH (ref 70–99)
Potassium: 2.9 mmol/L — ABNORMAL LOW (ref 3.5–5.1)
Sodium: 121 mmol/L — ABNORMAL LOW (ref 135–145)
Total Bilirubin: 0.5 mg/dL (ref 0.3–1.2)
Total Protein: 7.4 g/dL (ref 6.5–8.1)

## 2021-08-12 LAB — LACTIC ACID, PLASMA
Lactic Acid, Venous: 2.9 mmol/L (ref 0.5–1.9)
Lactic Acid, Venous: 3 mmol/L (ref 0.5–1.9)

## 2021-08-12 LAB — MAGNESIUM: Magnesium: 1.9 mg/dL (ref 1.7–2.4)

## 2021-08-12 LAB — PROTIME-INR
INR: 1.1 (ref 0.8–1.2)
Prothrombin Time: 14.4 seconds (ref 11.4–15.2)

## 2021-08-12 LAB — RESP PANEL BY RT-PCR (FLU A&B, COVID) ARPGX2
Influenza A by PCR: NEGATIVE
Influenza B by PCR: NEGATIVE
SARS Coronavirus 2 by RT PCR: NEGATIVE

## 2021-08-12 LAB — TROPONIN I (HIGH SENSITIVITY): Troponin I (High Sensitivity): 16 ng/L (ref <18)

## 2021-08-12 MED ORDER — ENOXAPARIN SODIUM 30 MG/0.3ML IJ SOSY
30.0000 mg | PREFILLED_SYRINGE | INTRAMUSCULAR | Status: DC
  Administered 2021-08-12 – 2021-08-16 (×5): 30 mg via SUBCUTANEOUS
  Filled 2021-08-12 (×5): qty 0.3

## 2021-08-12 MED ORDER — VENLAFAXINE HCL ER 75 MG PO CP24
300.0000 mg | ORAL_CAPSULE | Freq: Every day | ORAL | Status: DC
  Administered 2021-08-14 – 2021-08-20 (×7): 300 mg via ORAL
  Filled 2021-08-12 (×3): qty 4
  Filled 2021-08-12: qty 2
  Filled 2021-08-12 (×4): qty 4

## 2021-08-12 MED ORDER — ACETAMINOPHEN 650 MG RE SUPP: 650.0000 mg | Freq: Four times a day (QID) | RECTAL | Status: DC | PRN

## 2021-08-12 MED ORDER — ENSURE ENLIVE PO LIQD
237.0000 mL | Freq: Three times a day (TID) | ORAL | Status: DC
  Administered 2021-08-13 – 2021-08-20 (×22): 237 mL via ORAL

## 2021-08-12 MED ORDER — VANCOMYCIN VARIABLE DOSE PER UNSTABLE RENAL FUNCTION (PHARMACIST DOSING): Status: DC

## 2021-08-12 MED ORDER — ACETAMINOPHEN 325 MG PO TABS
650.0000 mg | ORAL_TABLET | Freq: Four times a day (QID) | ORAL | Status: DC | PRN
  Administered 2021-08-13 – 2021-08-18 (×6): 650 mg via ORAL
  Filled 2021-08-12 (×6): qty 2

## 2021-08-12 MED ORDER — FENTANYL CITRATE PF 50 MCG/ML IJ SOSY
50.0000 ug | PREFILLED_SYRINGE | Freq: Once | INTRAMUSCULAR | Status: AC
  Administered 2021-08-12: 50 ug via INTRAVENOUS
  Filled 2021-08-12: qty 1

## 2021-08-12 MED ORDER — POTASSIUM CHLORIDE 10 MEQ/100ML IV SOLN
10.0000 meq | Freq: Once | INTRAVENOUS | Status: AC
  Administered 2021-08-12: 10 meq via INTRAVENOUS
  Filled 2021-08-12: qty 100

## 2021-08-12 MED ORDER — POTASSIUM CHLORIDE CRYS ER 20 MEQ PO TBCR
40.0000 meq | EXTENDED_RELEASE_TABLET | Freq: Once | ORAL | Status: AC
  Administered 2021-08-12: 40 meq via ORAL
  Filled 2021-08-12: qty 2

## 2021-08-12 MED ORDER — SODIUM CHLORIDE 0.9 % IV SOLN
2.0000 g | Freq: Once | INTRAVENOUS | Status: AC
  Administered 2021-08-12: 2 g via INTRAVENOUS
  Filled 2021-08-12: qty 2

## 2021-08-12 MED ORDER — SODIUM CHLORIDE 0.9 % IV SOLN: INTRAVENOUS | Status: DC

## 2021-08-12 MED ORDER — SODIUM CHLORIDE 0.9 % IV SOLN
1.0000 g | INTRAVENOUS | Status: DC
  Administered 2021-08-13 – 2021-08-15 (×3): 1 g via INTRAVENOUS
  Filled 2021-08-12 (×4): qty 10

## 2021-08-12 MED ORDER — PRAMIPEXOLE DIHYDROCHLORIDE 1 MG PO TABS
1.0000 mg | ORAL_TABLET | Freq: Every day | ORAL | Status: DC
  Administered 2021-08-13 – 2021-08-20 (×8): 1 mg via ORAL
  Filled 2021-08-12 (×8): qty 1

## 2021-08-12 MED ORDER — PANTOPRAZOLE SODIUM 40 MG PO TBEC
40.0000 mg | DELAYED_RELEASE_TABLET | Freq: Every day | ORAL | Status: DC
  Administered 2021-08-13 – 2021-08-20 (×8): 40 mg via ORAL
  Filled 2021-08-12 (×8): qty 1

## 2021-08-12 MED ORDER — SODIUM CHLORIDE 0.9% FLUSH
3.0000 mL | Freq: Two times a day (BID) | INTRAVENOUS | Status: DC
  Administered 2021-08-12 – 2021-08-20 (×10): 3 mL via INTRAVENOUS

## 2021-08-12 MED ORDER — LACTATED RINGERS IV BOLUS
1500.0000 mL | Freq: Once | INTRAVENOUS | Status: AC
  Administered 2021-08-12: 1500 mL via INTRAVENOUS

## 2021-08-12 MED ORDER — VANCOMYCIN HCL 750 MG/150ML IV SOLN
750.0000 mg | Freq: Once | INTRAVENOUS | Status: DC
  Administered 2021-08-12: 750 mg via INTRAVENOUS
  Filled 2021-08-12: qty 150

## 2021-08-12 NOTE — Progress Notes (Signed)
Pharmacy Antibiotic Note  Cynthia [name] is a 60 y.o. female admitted on 08/12/2021 with bacteremia.  Pharmacy has been consulted for vancomycin dosing.  Patient presents with disorientation and weakness after not feeling well for past 3 days. Patient also has ostomy with red, bulging stoma.  Patient is afebrile with WBC 13.7 and SCr 3.30 (baseline ~1.1).   Cefepime per MD  Plan: Start vancomycin 750 mg IV once Enter maintenance doses per renal function as patient has severe AKI Monitor labs, cultures, levels, and clinical improvement  Height: 5' 4"  (162.6 cm) Weight: 47.6 kg (105 lb) IBW/kg (Calculated) : 54.7  Temp (24hrs), Avg:97.5 F (36.4 C), Min:97.5 F (36.4 C), Max:97.5 F (36.4 C)  Recent Labs  Lab 08/12/21 1510 08/12/21 1544  WBC 13.7*  --   CREATININE  --  3.30*    Estimated Creatinine Clearance: 13.5 mL/min (A) (by C-G formula based on SCr of 3.3 mg/dL (H)).    Allergies  Allergen Reactions   Iodine Other (See Comments)    REACTION: in eye gtts Iodine eye drops; pt has no reactions to CT contrast    Antimicrobials this admission: Vancomycin 10/10>> Cefepime 10/10>>  Microbilogy results: 10/10 BCx: in process  Thank you for allowing pharmacy to be a part of this patient's care.  Donald Pore 08/12/2021 4:12 PM

## 2021-08-12 NOTE — H&P (Signed)
History and Physical   Cynthia [name] EXN:[id number] DOB: February 20, 1961 DOA: 08/12/2021  PCP: Denita Lung, MD   Patient coming from: Home  Chief Complaint: Confusion, weakness, issues with ostomy  HPI: Cynthia [name] is a 60 y.o. female with medical history significant of adjustment sorter, anxiety, depression, colitis, Crohn's disease history of fistula and status post ileostomy, hypertension, GERD, mitral prolapse, RLS presenting with confusion, weakness and issues with her ostomy.  Patient alert and oriented when seen by me.  She reports she has had 3 days or so of nausea vomiting and diarrhea and has had 2 days of redness at her stoma and the skin around her stoma.  Reportedly she was not wearing an ostomy bag when she arrived and does not always wear 1.  She reports significant irritation and spillage around the stoma.  As above she initially came in confused but is now alert and oriented.  She also reports some chills and shortness of breath related to pain in her abdomen and she also reports chills.  Denies fevers, chest pain.  Patient recently admitted from 9/6 until 9/11 for AKI, hyponatremia, hyperkalemia, hypovolemia in the setting of nausea vomiting and diarrhea after status post her ileostomy and bowel resection in April of this year.  Initially went to the ICU and required fluid and vasopressors and was weaned off and transferred out to the hospitalist service.  Other labs also improved for discharge.  Discharge creatinine 1.2, discharge potassium 4.2, discharge sodium 136, discharge bicarb 22.  ED Course: Vital signs in the ED significant for heart rate in the 80s to 140s, blood pressure in 100 so that this is stable for her.  Lab work-up showed CMP with sodium 121, potassium 2.9, chloride 83, bicarb 21, gap 17, BUN 110, creatinine 3.1 from baseline around 1.2.  CBC with leukocytosis to 13.7.  PT and INR within normal limits.  Lactic acid initially elevated to 2.9 with repeat  pending.  Troponin normal.  Respiratory panel for flu and COVID pending.  Urinalysis without evidence of infection.  Blood cultures pending.  Chest x-ray showed no acute Hilda Blades.  CT abdomen pelvis showed no acute abnormality did demonstrate known history of subtotal colectomy with ileostomy and anemia abdominal fistula also air-fluid level in the bladder noted in the setting of recent catheterization.  Magnesium level pending.  Patient started on vanc, cefepime in the ED also received 1.5 L fluid bolus and 20 mEq of IV potassium.  Also received a dose of fentanyl.  Review of Systems: As per HPI otherwise all other systems reviewed and are negative.  Past Medical History:  Diagnosis Date   Anemia 2012   Anxiety    Bronchitis    Crohn's ileitis ([place])    Depression    GERD (gastroesophageal reflux disease)    Headache(784.0)    MIGRAINE   Heart murmur    Hiatal hernia    Hypertension    IBS (irritable bowel syndrome)    Incontinence    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HCC)    MVP (mitral valve prolapse)    RLS (restless legs syndrome)    Tubular adenoma of colon 08/2019    Past Surgical History:  Procedure Laterality Date   ABDOMINAL HYSTERECTOMY  06/17/2011   Procedure: HYSTERECTOMY ABDOMINAL;  Surgeon: Arloa Koh;  Location: [facility];  Service: Gynecology;  Laterality: N/A;  Converted to Abdominal Hysterectomy with lysis of adhesions    ANTERIOR AND POSTERIOR REPAIR  02/25/2012   Procedure: ANTERIOR (CYSTOCELE) AND  POSTERIOR REPAIR (RECTOCELE);  Surgeon: Daria Pastures, MD;  Location: [facility];  Service: Gynecology;  Laterality: N/A;  ANterior cystocele repair ONLY   BIOPSY  08/29/2019   Procedure: BIOPSY;  Surgeon: Lavena Bullion, DO;  Location: [facility];  Service: Gastroenterology;;   BLADDER SUSPENSION  02/25/2012   Procedure: TRANSVAGINAL TAPE (TVT) PROCEDURE;  Surgeon: Daria Pastures, MD;  Location: [facility];  Service: Gynecology;  Laterality: N/A;   COLONOSCOPY WITH  PROPOFOL N/A 08/29/2019   Procedure: COLONOSCOPY WITH PROPOFOL;  Surgeon: Lavena Bullion, DO;  Location: [facility];  Service: Gastroenterology;  Laterality: N/A;   CYSTOSCOPY  02/25/2012   Procedure: CYSTOSCOPY;  Surgeon: Daria Pastures, MD;  Location: [facility];  Service: Gynecology;  Laterality: N/A;   ILEOSTOMY     KNEE ARTHROSCOPY  2010   LAPAROSCOPIC ASSISTED VAGINAL HYSTERECTOMY  06/17/2011   Procedure: LAPAROSCOPIC ASSISTED VAGINAL HYSTERECTOMY;  Surgeon: Arloa Koh;  Location: [facility];  Service: Gynecology;  Laterality: N/A;  Attempted    POLYPECTOMY  08/29/2019   Procedure: POLYPECTOMY;  Surgeon: Lavena Bullion, DO;  Location: [facility];  Service: Gastroenterology;;   SALPINGOOPHORECTOMY  06/17/2011   Procedure: SALPINGO OOPHERECTOMY;  Surgeon: Arloa Koh;  Location: [facility];  Service: Gynecology;  Laterality: Bilateral;   TONSILLECTOMY      Social History  reports that she has been smoking cigarettes. She has never used smokeless tobacco. She reports that she does not currently use alcohol. She reports that she does not currently use drugs after having used the following drugs: Cocaine.  Allergies  Allergen Reactions   Iodine Other (See Comments)    REACTION: in eye gtts Iodine eye drops; pt has no reactions to CT contrast    Family History  Problem Relation Age of Onset   Diabetes Mother    Dementia Mother    Clotting disorder Father    Macular degeneration Father    Crohn's disease Sister    Colon cancer Neg Hx    Esophageal cancer Neg Hx    Pancreatic cancer Neg Hx    Stomach cancer Neg Hx    Liver disease Neg Hx   Reviewed on admission  Prior to Admission medications   Medication Sig Start Date End Date Taking? Authorizing Provider  acetaminophen (TYLENOL) 325 MG tablet Take 650 mg by mouth every 6 (six) hours as needed for mild pain, fever or headache.    [provider]  esomeprazole (NEXIUM) 40 MG capsule Take 1 capsule (40 mg total)  by mouth 2 (two) times daily before a meal. 07/14/21 08/13/21  Arrien, Jimmy Picket, MD  feeding supplement (ENSURE ENLIVE / ENSURE PLUS) LIQD Take 237 mLs by mouth 3 (three) times daily between meals. 07/14/21 08/13/21  Arrien, Jimmy Picket, MD  loperamide (IMODIUM) 2 MG capsule Take 1 capsule (2 mg total) by mouth 2 (two) times daily. Patient not taking: Reported on 07/19/2021 07/14/21   Arrien, Jimmy Picket, MD  Multiple Vitamin (MULTIVITAMIN WITH MINERALS) TABS tablet Take 1 tablet by mouth daily. Patient not taking: Reported on 07/19/2021 07/14/21 08/13/21  Arrien, Jimmy Picket, MD  polycarbophil (FIBERCON) 625 MG tablet Take 1 tablet (625 mg total) by mouth 3 (three) times daily. Patient not taking: Reported on 07/19/2021 07/14/21 08/13/21  Arrien, Jimmy Picket, MD  pramipexole (MIRAPEX) 1 MG tablet Take 1 tablet (1 mg total) by mouth daily. 03/05/21   Denita Lung, MD  venlafaxine XR (EFFEXOR-XR) 150 MG 24 hr capsule Take 2 capsules (  300 mg total) by mouth daily with breakfast. 03/05/21   Denita Lung, MD    Physical Exam: Vitals:   08/12/21 1758 08/12/21 1830 08/12/21 1900 08/12/21 1915  BP: 103/78 101/81 103/74 107/83  Pulse: 88 85 80 81  Resp: 12 11 14 14   Temp:      TempSrc:      SpO2: 100% 100% 100% 100%  Weight:      Height:       Physical Exam Constitutional:      Comments: Thin female, chronically ill-appearing, mild distress.  HENT:     Head: Normocephalic and atraumatic.     Mouth/Throat:     Mouth: Mucous membranes are moist.     Pharynx: Oropharynx is clear.  Eyes:     Extraocular Movements: Extraocular movements intact.     Pupils: Pupils are equal, round, and reactive to light.  Cardiovascular:     Rate and Rhythm: Normal rate and regular rhythm.     Pulses: Normal pulses.     Heart sounds: Normal heart sounds.     Comments: Monitor intermittently showing extreme tachycardia however this is picking up her T waves abnormally and at times doubling  her heart rate. Pulmonary:     Effort: Pulmonary effort is normal. No respiratory distress.     Breath sounds: Normal breath sounds.  Abdominal:     General: Bowel sounds are normal. There is no distension.     Tenderness: There is abdominal tenderness.     Comments: Red protruding stoma surrounding erythematous and warm, irritated skin.  Musculoskeletal:        General: No swelling or deformity.  Skin:    General: Skin is warm and dry.     Comments: Erythema tenderness and warmth of skin surrounding stoma  Neurological:     General: No focal deficit present.     Mental Status: She is oriented to person, place, and time. Mental status is at baseline.     Labs on Admission: I have personally reviewed following labs and imaging studies  CBC: Recent Labs  Lab 08/12/21 1510 08/12/21 1544  WBC 13.7*  --   NEUTROABS 11.6*  --   HGB 13.3 13.9  HCT 37.0 41.0  MCV 82.6  --   PLT 350  --     Basic Metabolic Panel: Recent Labs  Lab 08/12/21 1510 08/12/21 1544  NA 121* 120*  K 2.9* 2.9*  CL 83* 83*  CO2 21*  --   GLUCOSE 211* 209*  BUN 110* 128*  CREATININE 3.16* 3.30*  CALCIUM 9.4  --     GFR: Estimated Creatinine Clearance: 13.5 mL/min (A) (by C-G formula based on SCr of 3.3 mg/dL (H)).  Liver Function Tests: Recent Labs  Lab 08/12/21 1510  AST 21  ALT 14  ALKPHOS 58  BILITOT 0.5  PROT 7.4  ALBUMIN 4.0    Urine analysis:    Component Value Date/Time   COLORURINE YELLOW 08/12/2021 1510   APPEARANCEUR HAZY (A) 08/12/2021 1510   LABSPEC 1.013 08/12/2021 1510   LABSPEC 1,025 12/18/2020 1644   PHURINE 5.0 08/12/2021 1510   GLUCOSEU 50 (A) 08/12/2021 1510   HGBUR NEGATIVE 08/12/2021 1510   BILIRUBINUR NEGATIVE 08/12/2021 1510   BILIRUBINUR small (A) 12/18/2020 1644   BILIRUBINUR n 12/04/2011 1014   KETONESUR NEGATIVE 08/12/2021 [place] 08/12/2021 1510   UROBILINOGEN negative 12/04/2011 1014   NITRITE NEGATIVE 08/12/2021 [place] 08/12/2021 1510  Radiological Exams on Admission: CT ABDOMEN PELVIS WO CONTRAST  Result Date: 08/12/2021 CLINICAL DATA:  Disorientation, weakness, problems with ostomy EXAM: CT ABDOMEN AND PELVIS WITHOUT CONTRAST TECHNIQUE: Multidetector CT imaging of the abdomen and pelvis was performed following the standard protocol without IV contrast. COMPARISON:  07/10/2021 FINDINGS: Lower chest: No acute abnormality. Hepatobiliary: No solid liver abnormality is seen. Small gallstone in the gallbladder fundus. No gallbladder wall thickening, or biliary dilatation. Pancreas: Unremarkable. No pancreatic ductal dilatation or surrounding inflammatory changes. Spleen: Normal in size without significant abnormality. Adrenals/Urinary Tract: Adrenal glands are unremarkable. Kidneys are normal, without renal calculi, solid lesion, or hydronephrosis. Air-fluid level within the urinary bladder. Stomach/Bowel: Stomach is within normal limits. 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 Vascular/Lymphatic: Scattered aortic atherosclerosis. No enlarged abdominal or pelvic lymph nodes. Reproductive: Status post hysterectomy. Other: No abdominal wall hernia or abnormality. No abdominopelvic ascites. Musculoskeletal: No acute or significant osseous findings. IMPRESSION: 1. 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 No evidence of bowel obstruction or other complication. 2. Air-fluid level within the urinary bladder. Correlate for recent catheterization. 3. Cholelithiasis without evidence of acute cholecystitis. Aortic Atherosclerosis (ICD10-I70.0). Electronically Signed   By: Delanna Ahmadi M.D.   On: 08/12/2021 18:49   DG Chest Portable 1 View  Result Date: 08/12/2021 CLINICAL DATA:  Sepsis EXAM: PORTABLE CHEST 1 VIEW COMPARISON:  Chest x-ray 07/10/2021 FINDINGS: Interval removal  of a right internal jugular approach central venous catheter. The heart and mediastinal contours are unchanged. No focal consolidation. No pulmonary edema. No pleural effusion. No pneumothorax. No acute osseous abnormality. IMPRESSION: No active disease. Electronically Signed   By: Iven Finn M.D.   On: 08/12/2021 16:26    EKG: Independently reviewed.  Sinus tachycardia at 105 bpm, RSR prime similar to previous, heart rate slower.  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  Potassium 2.9, sodium 121. > Magnesium pending  > Recently admitted with similar presentation last month in the setting of nausea vomiting diarrhea.  > See 1.5 L IV fluids in ED.  Given her blood pressure is near her baseline around 294 systolic we will hold off on additional fluid so we can repeat BMP to determine appropriate rate considering her hyponatremia. - Monitor on progressive unit - Repeat BMP, and then trend Na  - Fluid with normal sodium at appropriate rate based on sodium response to initial fluid bolus - Add additional 40 mEq p.o. potassium. - Follow-up magnesium - Monitor renal function and electrolytes - Avoid nephrotoxic agents  Encephalopathy > Likely in the setting of uremia due to AKI as above, has improved since initial treatments in the ED. - Continue to monitor  ?Cellulitis > Irritated red, warm tissue surrounding stoma related to spillage of contents.  Possible cellulitis given leukocytosis (though there could be a degree of hemoconcentration). > Received Vanco and cefepime in the  ED - We will de-escalate to ceftriaxone - Trend fever curve and white count - WOC consult  Crohn's > Status post subtotal colectomy and ileostomy >  Irritated skin around the site of the stoma protruding red stoma as above. > Reportedly came in not wearing a colostomy bag and does not always wear one. > Now having recurrent episodes of dehydration from nausea vomiting diarrhea leading to admission for kidney injury and electrolyte abnormalities - WOC consult - Continue supplemental nutrition - Need additional guidance on dietary changes to reduce diarrhea/increased ostomy output  GERD - Continue PPI  RLS - Continue Pramipexole  Anxiety Depression - Continue home venlafaxine  DVT prophylaxis: Lovenox Code Status:   Full  Family Communication:  None on admission.  Patient states that her roommates know she is here.  She states there is no other family that she wants updated with her current status.  Disposition Plan:   Patient is from:  Home  Anticipated DC to:  Home  Anticipated DC date:  1 to 4 days  Anticipated DC barriers: None  Consults called:  None  Admission status:  Observation, progressive   Severity of Illness: The appropriate patient status for this patient is OBSERVATION. Observation status is judged to be reasonable and necessary in order to provide the required intensity of service to ensure the patient's safety. The patient's presenting symptoms, physical exam findings, and initial radiographic and laboratory data in the context of their medical condition is felt to place them at decreased risk for further clinical deterioration. Furthermore, it is anticipated that the patient will be medically stable for discharge from the hospital within 2 midnights of admission. The following factors support the patient status of observation.   " The patient's presenting symptoms include nausea, vomiting, diarrhea, confusion, weakness, skin irritation. " The physical exam  findings include thin female, bulging stoma and surrounding erythema and warmth of skin. " The initial radiographic and laboratory data are Lab work-up showed CMP with sodium 121, potassium 2.9, chloride 83, bicarb 21, gap 17, BUN 110, creatinine 3.1 from baseline around 1.2.  CBC with leukocytosis to 13.7.  PT and INR within normal limits.  Lactic acid initially elevated to 2.9 with repeat pending.  Troponin normal.  Respiratory panel for flu and COVID pending.  Urinalysis without evidence of infection.  Blood cultures pending.  Chest x-ray showed no acute Hilda Blades.  CT abdomen pelvis showed no acute abnormality did demonstrate known history of subtotal colectomy with ileostomy and anemia abdominal fistula also air-fluid level in the bladder noted in the setting of recent catheterization.  Magnesium level pending.   Marcelyn Bruins MD [facility]  How to contact the [facility] Attending or Consulting provider [place] or covering provider during after hours [place], for this patient?   Check the care team in [facility] and look for a) attending/consulting [facility] provider listed and b) the [facility] team listed Log into [email or web address] and use [place]'s universal password to access. If you do not have the password, please contact the hospital operator. Locate the [facility] provider you are looking for under [facility] and page to a number that you can be directly reached. If you still have difficulty reaching the provider, please page the [facility] (Director on Call) for the Hospitalists listed on amion for assistance.  08/12/2021, 8:24 PM

## 2021-08-12 NOTE — ED Triage Notes (Signed)
Pt BIB [facility] from home c/o disorientation, weakness and issues with her ostomy. Per Pt's roommates this is not normal for pt. Pt states she started not feeling well about 3 days ago. She is unsure what is going on just that she does not feel good. Pt's stoma is red and bulging out per pt that is not normal either.

## 2021-08-12 NOTE — ED Provider Notes (Signed)
[facility] EMERGENCY DEPARTMENT Provider Note   CSN: [id number] Arrival date & time: 08/12/21  1449     History No chief complaint on file.   Cynthia [name] is a 60 y.o. female.  HPI This is a 60 year old female with history of Crohn's with fistulas and status post colostomy and recent admission with septic shock and acute renal failure who presents with altered mental status.  History provided by EMS who obtained history from patient's roommates when they picked her up.  They report patient has been confused for the last 2 days.  Has had drainage from around the ostomy site with pain and skin changes.  Patient had systolic blood pressure of 90 and tachycardia to 115 for EMS.  No interventions given in the field.    Past Medical History:  Diagnosis Date   Anemia 2012   Anxiety    Bronchitis    Crohn's ileitis ([place])    Depression    GERD (gastroesophageal reflux disease)    Headache(784.0)    MIGRAINE   Heart murmur    Hiatal hernia    Hypertension    IBS (irritable bowel syndrome)    Incontinence    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HCC)    MVP (mitral valve prolapse)    RLS (restless legs syndrome)    Tubular adenoma of colon 08/2019    Patient Active Problem List   Diagnosis Date Noted   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[place])    Protein-calorie malnutrition, severe 07/11/2021   Hypotension 07/10/2021   High anion gap metabolic acidosis 42/70/6237   Acute renal failure, unspecified acute renal failure type ([place]) 07/10/2021   Shock circulatory ([place])    Nausea and vomiting 06/26/2021   Abdominal pain 06/26/2021   Sepsis ([place]) 06/26/2021   Sepsis due to cellulitis ([place]) 04/05/2021   AKI (acute kidney injury) ([place]) 04/05/2021   Hyperkalemia 04/05/2021   Hyponatremia 04/05/2021   Adjustment disorder 03/14/2021   Unspecified severe protein-calorie malnutrition ([place]) 03/13/2021   COVID-19 01/25/2021   Leukocytosis 01/24/2021   Crohn's colitis, with intestinal  obstruction ([place]) 01/24/2021   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   History of rectal polyp 2020 01/12/2021   Anxiety associated with depression 01/12/2021   Hypokalemia 12/04/2020   Cough 11/27/2020   Otalgia of both ears 11/27/2020   Body aches 11/27/2020   Fever 11/27/2020   High risk medication use 11/27/2020   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[place]) 06/27/2020   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   Crohn's ileitis, with intestinal obstruction ([place]) 10/05/2019   Colitis    Rectal polyp    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   RLS (restless legs syndrome) 09/25/2014   Current smoker 09/19/2011   Migraine headache 09/19/2011   Essential hypertension 09/19/2011   Obesity (BMI 30-39.9) 09/19/2011   History of serrated colonic polyp 01/13/2011   Gastroesophageal reflux disease 03/17/2008   MVP (mitral valve prolapse) 03/17/2008   HIATAL HERNIA 05/03/2002    Past Surgical History:  Procedure Laterality Date   ABDOMINAL HYSTERECTOMY  06/17/2011   Procedure: HYSTERECTOMY ABDOMINAL;  Surgeon: Arloa Koh;  Location: [facility];  Service: Gynecology;  Laterality: N/A;  Converted to Abdominal Hysterectomy with lysis of adhesions    ANTERIOR AND POSTERIOR REPAIR  02/25/2012   Procedure: ANTERIOR (CYSTOCELE) AND POSTERIOR REPAIR (RECTOCELE);  Surgeon: Daria Pastures, MD;  Location: [facility];  Service: Gynecology;  Laterality: N/A;  ANterior cystocele repair ONLY   BIOPSY  08/29/2019   Procedure: BIOPSY;  Surgeon: Lavena Bullion, DO;  Location: [facility];  Service: Gastroenterology;;   BLADDER SUSPENSION  02/25/2012   Procedure: TRANSVAGINAL TAPE (TVT) PROCEDURE;  Surgeon: Daria Pastures, MD;  Location: [facility];  Service: Gynecology;  Laterality: N/A;   COLONOSCOPY WITH PROPOFOL N/A 08/29/2019   Procedure: COLONOSCOPY WITH PROPOFOL;  Surgeon: Lavena Bullion, DO;  Location: [facility];  Service: Gastroenterology;  Laterality: N/A;   CYSTOSCOPY  02/25/2012   Procedure: CYSTOSCOPY;  Surgeon: Daria Pastures, MD;  Location: [facility];  Service: Gynecology;  Laterality: N/A;   ILEOSTOMY     KNEE ARTHROSCOPY  2010   LAPAROSCOPIC ASSISTED VAGINAL HYSTERECTOMY  06/17/2011   Procedure: LAPAROSCOPIC ASSISTED VAGINAL HYSTERECTOMY;  Surgeon: Arloa Koh;  Location: [facility];  Service: Gynecology;  Laterality: N/A;  Attempted    POLYPECTOMY  08/29/2019   Procedure: POLYPECTOMY;  Surgeon: Lavena Bullion, DO;  Location: [facility];  Service: Gastroenterology;;   SALPINGOOPHORECTOMY  06/17/2011   Procedure: SALPINGO OOPHERECTOMY;  Surgeon: Arloa Koh;  Location: [facility];  Service: Gynecology;  Laterality: Bilateral;   TONSILLECTOMY       OB History   No obstetric history on file.     Family History  Problem Relation Age of Onset   Diabetes Mother    Dementia Mother    Clotting disorder Father    Macular degeneration Father    Crohn's disease Sister    Colon cancer Neg Hx    Esophageal cancer Neg Hx    Pancreatic cancer Neg Hx    Stomach cancer Neg Hx    Liver disease Neg Hx     Social History   Tobacco Use   Smoking status: Some Days    Packs/day: 0.00    Types: Cigarettes   Smokeless tobacco: Never   Tobacco comments:    none in 2 weeks as of 12/04/20  Vaping Use   Vaping Use: Former  Substance Use Topics   Alcohol use: Not Currently   Drug use: Not Currently    Types: Cocaine    Comment: clean x 8 weeks    Home Medications Prior to Admission medications   Medication Sig Start Date End Date Taking? Authorizing Provider  acetaminophen (TYLENOL) 325 MG tablet Take 650 mg by mouth every 6 (six) hours as needed for mild pain, fever or headache.    [provider]  esomeprazole (NEXIUM) 40 MG capsule Take 1 capsule (40 mg total) by mouth 2 (two) times daily before a meal. 07/14/21 08/13/21  Arrien, Jimmy Picket, MD   feeding supplement (ENSURE ENLIVE / ENSURE PLUS) LIQD Take 237 mLs by mouth 3 (three) times daily between meals. 07/14/21 08/13/21  Arrien, Jimmy Picket, MD  loperamide (IMODIUM) 2 MG capsule Take 1 capsule (2 mg total) by mouth 2 (two) times daily. Patient not taking: Reported on 07/19/2021 07/14/21   Arrien, Jimmy Picket, MD  Multiple Vitamin (MULTIVITAMIN WITH MINERALS) TABS tablet Take 1 tablet by mouth daily. Patient not taking: Reported on 07/19/2021 07/14/21 08/13/21  Arrien, Jimmy Picket, MD  polycarbophil (FIBERCON) 625 MG tablet Take 1 tablet (625 mg total) by mouth 3 (three) times daily. Patient not taking: Reported on 07/19/2021 07/14/21 08/13/21  Arrien, Jimmy Picket, MD  pramipexole (MIRAPEX) 1 MG tablet Take 1 tablet (1 mg total) by mouth daily. 03/05/21   Denita Lung, MD  venlafaxine XR (EFFEXOR-XR) 150 MG 24 hr capsule  Take 2 capsules (300 mg total) by mouth daily with breakfast. 03/05/21   Denita Lung, MD    Allergies    Iodine  Review of Systems   Review of Systems  Constitutional:  Negative for chills and fever.  HENT:  Negative for ear pain and sore throat.   Eyes:  Negative for pain and visual disturbance.  Respiratory:  Negative for cough and shortness of breath.   Cardiovascular:  Negative for chest pain and palpitations.  Gastrointestinal:  Positive for abdominal pain and nausea. Negative for vomiting.  Genitourinary:  Negative for dysuria and hematuria.  Musculoskeletal:  Negative for arthralgias and back pain.  Skin:  Negative for color change and rash.  Neurological:  Negative for seizures and syncope.  All other systems reviewed and are negative.  Physical Exam Updated Vital Signs Ht 5' 4"  (1.626 m)   Wt 47.6 kg   LMP 06/10/2011 (Exact Date)   BMI 18.02 kg/m   Physical Exam Vitals and nursing note reviewed.  Constitutional:      General: She is not in acute distress.    Appearance: She is well-developed.  HENT:     Head: Normocephalic  and atraumatic.  Eyes:     Conjunctiva/sclera: Conjunctivae normal.  Cardiovascular:     Rate and Rhythm: Normal rate and regular rhythm.     Heart sounds: No murmur heard. Pulmonary:     Effort: Pulmonary effort is normal. No respiratory distress.     Breath sounds: Normal breath sounds.  Abdominal:     Palpations: Abdomen is soft.     Tenderness: There is abdominal tenderness. There is guarding. There is no rebound.     Comments: Significant redness around ostomy site without ostomy bag in place. Draining watery stool.   Musculoskeletal:     Cervical back: Neck supple.  Skin:    General: Skin is warm and dry.  Neurological:     General: No focal deficit present.     Mental Status: She is confused.     Comments: Oriented to self, time, place but not circumstances     ED Results / Procedures / Treatments   Labs (all labs ordered are listed, but only abnormal results are displayed) Labs Reviewed  COMPREHENSIVE METABOLIC PANEL - Abnormal; Notable for the following components:      Result Value   Sodium 121 (*)    Potassium 2.9 (*)    Chloride 83 (*)    CO2 21 (*)    Glucose, Bld 211 (*)    BUN 110 (*)    Creatinine, Ser 3.16 (*)    GFR, Estimated 16 (*)    Anion gap 17 (*)    All other components within normal limits  LACTIC ACID, PLASMA - Abnormal; Notable for the following components:   Lactic Acid, Venous 2.9 (*)    All other components within normal limits  LACTIC ACID, PLASMA - Abnormal; Notable for the following components:   Lactic Acid, Venous 3.0 (*)    All other components within normal limits  CBC WITH DIFFERENTIAL/PLATELET - Abnormal; Notable for the following components:   WBC 13.7 (*)    Neutro Abs 11.6 (*)    Abs Immature Granulocytes 0.11 (*)    All other components within normal limits  URINALYSIS, ROUTINE W REFLEX MICROSCOPIC - Abnormal; Notable for the following components:   APPearance HAZY (*)    Glucose, UA 50 (*)    All other components within  normal limits  BASIC METABOLIC  PANEL - Abnormal; Notable for the following components:   Sodium 123 (*)    Potassium 3.2 (*)    Chloride 91 (*)    CO2 18 (*)    BUN 97 (*)    Creatinine, Ser 2.59 (*)    Calcium 8.8 (*)    GFR, Estimated 20 (*)    All other components within normal limits  I-STAT CHEM 8, ED - Abnormal; Notable for the following components:   Sodium 120 (*)    Potassium 2.9 (*)    Chloride 83 (*)    BUN 128 (*)    Creatinine, Ser 3.30 (*)    Glucose, Bld 209 (*)    Calcium, Ion 1.05 (*)    TCO2 21 (*)    All other components within normal limits  RESP PANEL BY RT-PCR (FLU A&B, COVID) ARPGX2  CULTURE, BLOOD (ROUTINE X 2)  CULTURE, BLOOD (ROUTINE X 2)  PROTIME-INR  MAGNESIUM  CBC  BASIC METABOLIC PANEL  BASIC METABOLIC PANEL  BASIC METABOLIC PANEL  TROPONIN I (HIGH SENSITIVITY)    EKG EKG Interpretation  Date/Time:  Monday August 12 2021 15:07:34 EDT Ventricular Rate:  105 PR Interval:  143 QRS Duration: 97 QT Interval:  359 QTC Calculation: 475 R Axis:   -81 Text Interpretation: Sinus tachycardia Biatrial enlargement Inferior infarct, old Lateral infarct, recent Anterior infarct, old No significant change since last tracing Confirmed by Nanda Quinton [phone]) on 08/12/2021 3:10:45 PM  Radiology CT ABDOMEN PELVIS WO CONTRAST  Result Date: 08/12/2021 CLINICAL DATA:  Disorientation, weakness, problems with ostomy EXAM: CT ABDOMEN AND PELVIS WITHOUT CONTRAST TECHNIQUE: Multidetector CT imaging of the abdomen and pelvis was performed following the standard protocol without IV contrast. COMPARISON:  07/10/2021 FINDINGS: Lower chest: No acute abnormality. Hepatobiliary: No solid liver abnormality is seen. Small gallstone in the gallbladder fundus. No gallbladder wall thickening, or biliary dilatation. Pancreas: Unremarkable. No pancreatic ductal dilatation or surrounding inflammatory changes. Spleen: Normal in size without significant abnormality.  Adrenals/Urinary Tract: Adrenal glands are unremarkable. Kidneys are normal, without renal calculi, solid lesion, or hydronephrosis. Air-fluid level within the urinary bladder. Stomach/Bowel: Stomach is within normal limits. 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 Vascular/Lymphatic: Scattered aortic atherosclerosis. No enlarged abdominal or pelvic lymph nodes. Reproductive: Status post hysterectomy. Other: No abdominal wall hernia or abnormality. No abdominopelvic ascites. Musculoskeletal: No acute or significant osseous findings. IMPRESSION: 1. 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 No evidence of bowel obstruction or other complication. 2. Air-fluid level within the urinary bladder. Correlate for recent catheterization. 3. Cholelithiasis without evidence of acute cholecystitis. Aortic Atherosclerosis (ICD10-I70.0). Electronically Signed   By: Delanna Ahmadi M.D.   On: 08/12/2021 18:49   DG Chest Portable 1 View  Result Date: 08/12/2021 CLINICAL DATA:  Sepsis EXAM: PORTABLE CHEST 1 VIEW COMPARISON:  Chest x-ray 07/10/2021 FINDINGS: Interval removal of a right internal jugular approach central venous catheter. The heart and mediastinal contours are unchanged. No focal consolidation. No pulmonary edema. No pleural effusion. No pneumothorax. No acute osseous abnormality. IMPRESSION: No active disease. Electronically Signed   By: Iven Finn M.D.   On: 08/12/2021 16:26    Procedures Procedures   Medications Ordered in ED Medications - No data to display  ED Course  I have reviewed the triage vital signs and the nursing notes.  Pertinent labs & imaging results that were available during my care of the patient were  reviewed by me and considered in my medical decision making (see chart for details).    MDM Rules/Calculators/A&P                          On  arrival patient is tachycardic to 140s and hypotensive to systolic BP of 90 but with with appropriate MAP, and on chart review patient's blood pressure typically systolic of 469.  Disoriented to circumstances and unable to provide significant history.  Abdomen is tender with voluntary guarding but soft.  Ostomy site with severe erythema.  Concerning for sepsis with intra-abdominal versus skin source.  Labs ordered including lactate and blood cultures.  Given 30 cc/kg of fluids and initiated on broad-spectrum antibiotics, cefepime and vancomycin.    Initial lactate elevated to 2.9.  We will continue to trend.  Potassium is low at 2.9, will replete with 10 mEqx2 IV.  Na also low at 121, additional fluids will transition to NS. Will avoid p.o. repletion at this time given patient's abdominal exam.  White count elevated at 13.7, consistent with likely infectious cause of altered mental status. Creatinine elevated from 1.5-3.2, will avoid IV contrast and obtain CT of the abdomen pelvis without.  UA negative for UTI. CT abdomen pelvis without evidence of obstruction, perforation, or obvious infectious source.  Chest x-ray also negative for pneumonia or other cardiopulmonary abnormality.  Source of infection likely cellulitis associated with patient's ostomy site. Cultures pending.   Admitted to hospitalist for further evaluation and management.    Final Clinical Impression(s) / ED Diagnoses Final diagnoses:  Hypokalemia  Metabolic acidosis  AKI (acute kidney injury) [facility])    Rx / DC Orders ED Discharge Orders     None        Coralee Pesa, MD 08/12/21 2329    Sherwood Gambler, MD 08/13/21 1524

## 2021-08-13 ENCOUNTER — Telehealth: Payer: Self-pay

## 2021-08-13 DIAGNOSIS — Z90710 Acquired absence of both cervix and uterus: Secondary | ICD-10-CM | POA: Diagnosis not present

## 2021-08-13 DIAGNOSIS — E860 Dehydration: Secondary | ICD-10-CM | POA: Diagnosis present

## 2021-08-13 DIAGNOSIS — E43 Unspecified severe protein-calorie malnutrition: Secondary | ICD-10-CM | POA: Diagnosis present

## 2021-08-13 DIAGNOSIS — K50013 Crohn's disease of small intestine with fistula: Secondary | ICD-10-CM | POA: Diagnosis present

## 2021-08-13 DIAGNOSIS — G9341 Metabolic encephalopathy: Secondary | ICD-10-CM | POA: Diagnosis present

## 2021-08-13 DIAGNOSIS — Z932 Ileostomy status: Secondary | ICD-10-CM | POA: Diagnosis not present

## 2021-08-13 DIAGNOSIS — G2581 Restless legs syndrome: Secondary | ICD-10-CM | POA: Diagnosis present

## 2021-08-13 DIAGNOSIS — F418 Other specified anxiety disorders: Secondary | ICD-10-CM | POA: Diagnosis present

## 2021-08-13 DIAGNOSIS — I10 Essential (primary) hypertension: Secondary | ICD-10-CM | POA: Diagnosis present

## 2021-08-13 DIAGNOSIS — Z86010 Personal history of colonic polyps: Secondary | ICD-10-CM | POA: Diagnosis not present

## 2021-08-13 DIAGNOSIS — Z8616 Personal history of COVID-19: Secondary | ICD-10-CM | POA: Diagnosis not present

## 2021-08-13 DIAGNOSIS — L03311 Cellulitis of abdominal wall: Secondary | ICD-10-CM | POA: Diagnosis present

## 2021-08-13 DIAGNOSIS — E872 Acidosis, unspecified: Secondary | ICD-10-CM | POA: Diagnosis present

## 2021-08-13 DIAGNOSIS — F17210 Nicotine dependence, cigarettes, uncomplicated: Secondary | ICD-10-CM | POA: Diagnosis present

## 2021-08-13 DIAGNOSIS — E876 Hypokalemia: Secondary | ICD-10-CM | POA: Diagnosis present

## 2021-08-13 DIAGNOSIS — Z20822 Contact with and (suspected) exposure to covid-19: Secondary | ICD-10-CM | POA: Diagnosis present

## 2021-08-13 DIAGNOSIS — K219 Gastro-esophageal reflux disease without esophagitis: Secondary | ICD-10-CM | POA: Diagnosis present

## 2021-08-13 DIAGNOSIS — E861 Hypovolemia: Secondary | ICD-10-CM | POA: Diagnosis present

## 2021-08-13 DIAGNOSIS — E8729 Other acidosis: Secondary | ICD-10-CM | POA: Diagnosis not present

## 2021-08-13 DIAGNOSIS — Z79899 Other long term (current) drug therapy: Secondary | ICD-10-CM | POA: Diagnosis not present

## 2021-08-13 DIAGNOSIS — N179 Acute kidney failure, unspecified: Secondary | ICD-10-CM | POA: Diagnosis present

## 2021-08-13 DIAGNOSIS — I959 Hypotension, unspecified: Secondary | ICD-10-CM | POA: Diagnosis present

## 2021-08-13 DIAGNOSIS — E871 Hypo-osmolality and hyponatremia: Secondary | ICD-10-CM | POA: Diagnosis present

## 2021-08-13 DIAGNOSIS — Z681 Body mass index (BMI) 19 or less, adult: Secondary | ICD-10-CM | POA: Diagnosis not present

## 2021-08-13 LAB — BASIC METABOLIC PANEL
Anion gap: 11 (ref 5–15)
Anion gap: 15 (ref 5–15)
Anion gap: 11 (ref 5–15)
Anion gap: 10 (ref 5–15)
Anion gap: 10 (ref 5–15)
BUN: 90 mg/dL — ABNORMAL HIGH (ref 8–23)
BUN: 89 mg/dL — ABNORMAL HIGH (ref 8–23)
BUN: 82 mg/dL — ABNORMAL HIGH (ref 8–23)
BUN: 72 mg/dL — ABNORMAL HIGH (ref 8–23)
BUN: 65 mg/dL — ABNORMAL HIGH (ref 8–23)
CO2: 18 mmol/L — ABNORMAL LOW (ref 22–32)
CO2: 15 mmol/L — ABNORMAL LOW (ref 22–32)
CO2: 17 mmol/L — ABNORMAL LOW (ref 22–32)
CO2: 18 mmol/L — ABNORMAL LOW (ref 22–32)
CO2: 19 mmol/L — ABNORMAL LOW (ref 22–32)
Calcium: 8.9 mg/dL (ref 8.9–10.3)
Calcium: 9.1 mg/dL (ref 8.9–10.3)
Calcium: 8.8 mg/dL — ABNORMAL LOW (ref 8.9–10.3)
Calcium: 8.4 mg/dL — ABNORMAL LOW (ref 8.9–10.3)
Calcium: 8.3 mg/dL — ABNORMAL LOW (ref 8.9–10.3)
Chloride: 95 mmol/L — ABNORMAL LOW (ref 98–111)
Chloride: 94 mmol/L — ABNORMAL LOW (ref 98–111)
Chloride: 96 mmol/L — ABNORMAL LOW (ref 98–111)
Chloride: 98 mmol/L (ref 98–111)
Chloride: 97 mmol/L — ABNORMAL LOW (ref 98–111)
Creatinine, Ser: 2.48 mg/dL — ABNORMAL HIGH (ref 0.44–1.00)
Creatinine, Ser: 2.37 mg/dL — ABNORMAL HIGH (ref 0.44–1.00)
Creatinine, Ser: 2.43 mg/dL — ABNORMAL HIGH (ref 0.44–1.00)
Creatinine, Ser: 2.14 mg/dL — ABNORMAL HIGH (ref 0.44–1.00)
Creatinine, Ser: 2.1 mg/dL — ABNORMAL HIGH (ref 0.44–1.00)
GFR, Estimated: 22 mL/min — ABNORMAL LOW (ref >60)
GFR, Estimated: 23 mL/min — ABNORMAL LOW (ref >60)
GFR, Estimated: 22 mL/min — ABNORMAL LOW (ref >60)
GFR, Estimated: 26 mL/min — ABNORMAL LOW (ref >60)
GFR, Estimated: 26 mL/min — ABNORMAL LOW (ref >60)
Glucose, Bld: 79 mg/dL (ref 70–99)
Glucose, Bld: 74 mg/dL (ref 70–99)
Glucose, Bld: 65 mg/dL — ABNORMAL LOW (ref 70–99)
Glucose, Bld: 80 mg/dL (ref 70–99)
Glucose, Bld: 103 mg/dL — ABNORMAL HIGH (ref 70–99)
Potassium: 3.7 mmol/L (ref 3.5–5.1)
Potassium: 4.1 mmol/L (ref 3.5–5.1)
Potassium: 3.7 mmol/L (ref 3.5–5.1)
Potassium: 3.5 mmol/L (ref 3.5–5.1)
Potassium: 3.4 mmol/L — ABNORMAL LOW (ref 3.5–5.1)
Sodium: 124 mmol/L — ABNORMAL LOW (ref 135–145)
Sodium: 124 mmol/L — ABNORMAL LOW (ref 135–145)
Sodium: 124 mmol/L — ABNORMAL LOW (ref 135–145)
Sodium: 126 mmol/L — ABNORMAL LOW (ref 135–145)
Sodium: 126 mmol/L — ABNORMAL LOW (ref 135–145)

## 2021-08-13 LAB — CBC
HCT: 32 % — ABNORMAL LOW (ref 36.0–46.0)
Hemoglobin: 11.1 g/dL — ABNORMAL LOW (ref 12.0–15.0)
MCH: 29.7 pg (ref 26.0–34.0)
MCHC: 34.7 g/dL (ref 30.0–36.0)
MCV: 85.6 fL (ref 80.0–100.0)
Platelets: 254 10*3/uL (ref 150–400)
RBC: 3.74 MIL/uL — ABNORMAL LOW (ref 3.87–5.11)
RDW: 12.8 % (ref 11.5–15.5)
WBC: 10.8 10*3/uL — ABNORMAL HIGH (ref 4.0–10.5)
nRBC: 0 % (ref 0.0–0.2)

## 2021-08-13 LAB — CBG MONITORING, ED
Glucose-Capillary: 47 mg/dL — ABNORMAL LOW (ref 70–99)
Glucose-Capillary: 99 mg/dL (ref 70–99)

## 2021-08-13 MED ORDER — SODIUM CHLORIDE 0.9 % IV BOLUS
1000.0000 mL | Freq: Once | INTRAVENOUS | Status: AC
  Administered 2021-08-13: 1000 mL via INTRAVENOUS

## 2021-08-13 MED ORDER — DEXTROSE-NACL 5-0.9 % IV SOLN: INTRAVENOUS | Status: DC

## 2021-08-13 NOTE — Telephone Encounter (Signed)
[name], MD sent to [name], RN Her situation is unfortunate. She has missed multiple appts with [facility] and I want to proceed with discharge. We may need to see her during this admission so we should probably wait to send the letter until she is sent home from the hospital.

## 2021-08-13 NOTE — Progress Notes (Signed)
  Progress Note    Cynthia [name]   LKJ:[id number]  DOB: [birth year]  DOA: 08/12/2021     0 Date of Service: 08/13/2021   Past medical history of anxiety, depression, Crohn's disease with fistula and SP ileostomy, HTN, GERD, RLS.  Presents with complaints of generalized fatigue weakness, excessive output and nausea and vomiting. Found to have cellulitis and AKI  Subjective:  Abdominal pain improving.  No fever no chills.  Tolerating oral diet.  Output still high per patient.  Hospital Problems AKI Hypokalemia Hypokalemia hyponatremia High anion gap metabolic acidosis secondary to uremia >Creatinine elevated to 3.1 from baseline of 1.2.  Potassium 2.9, sodium 121. Continue IV fluids.  Continue to monitor renal function. Potassium replaced.  Encephalopathy In the setting of uremia and AKI. Mentation improving.  ?Cellulitis > Irritated red, warm tissue surrounding stoma related to spillage of contents.  Possible cellulitis given leukocytosis (though there could be a degree of hemoconcentration). > Received Vanco and cefepime in the ED - We will de-escalate to ceftriaxone - Trend fever curve and white count - WOC consult   Crohn's > Status post subtotal colectomy and ileostomy > Irritated skin around the site of the stoma protruding red stoma as above. > Reportedly came in not wearing a colostomy bag and does not always wear one. > Now having recurrent episodes of dehydration from nausea vomiting diarrhea leading to admission for kidney injury and electrolyte abnormalities - WOC consult - Continue supplemental nutrition - Need additional guidance on dietary changes to reduce diarrhea/increased ostomy output   GERD - Continue PPI  RLS - Continue Pramipexole   Anxiety Depression - Continue home venlafaxine   Objective Vital signs were reviewed and unremarkable.  Vitals:   08/13/21 1345 08/13/21 1414 08/13/21 1432 08/13/21 1946  BP: (!) 86/67  106/75 92/64   Pulse: 72  86 80  Resp: (!) 9  20 16   Temp:  (!) 97.5 F (36.4 C) 97.6 F (36.4 C) 97.6 F (36.4 C)  TempSrc:  Oral Oral Oral  SpO2: 100%  (!) 83% 100%  Weight:      Height:       47.6 kg  Exam General: Appear in mild distress, no Rash; Oral Mucosa Clear, moist. no Abnormal Neck Mass Or lumps, Conjunctiva normal  Cardiovascular: S1 and S2 Present, no Murmur, Respiratory: good respiratory effort, Bilateral Air entry present and CTA, no Crackles, no wheezes Abdomen: Bowel Sound present, Soft and no tenderness, redness on abdominal skin Extremities: no Pedal edema Neurology: alert and oriented to time, place, and person affect appropriate. no new focal deficit Gait not checked due to patient safety concerns    Labs / Other Information Sodium improving, creatinine better, mild hypokalemia,   Time spent: 30 mins  [facility] 08/13/2021, 8:14 PM

## 2021-08-13 NOTE — Telephone Encounter (Signed)
Notes sent to Dr. Fuller Plan to confirm discharge.  Patient currently inpatient with confusion and sepsis.

## 2021-08-13 NOTE — Telephone Encounter (Signed)
[name], MD sent to [name], RN Her recent no show was on 10/5. Previous no show for an APP visit. Did not show for UGI SBFT. Urine tox screen positive for cocaine in Sept. I want to proceed with discharge.

## 2021-08-13 NOTE — Telephone Encounter (Signed)
-----   Message from [name], MD sent at 08/07/2021 11:34 AM EDT ----- Barbera Setters, Please initiate discharge process for this patient with multiple no shows. Thanks, MS

## 2021-08-13 NOTE — Consult Note (Addendum)
[facility] Nurse ostomy consult note Patient receiving care in [facility] ED018 Stoma type/location: RLQ end ileostomy Stomal assessment/size: 1 1/8" round budded Peristomal assessment: Irritation over most of the abdomen d/t leakage.  Treatment options for stomal/peristomal skin: Crusting with stoma powder and 50M skin barrier wipes Output: thin yellow Ostomy pouching: 2pc. HOP connected to bedside drainage bag Education provided: None Enrolled patient in [place] program: Yes (Previous admission) High Output 2 piece fecal pouching system   Skin Nils Pyle # [address] Roselee Culver # [id number]     Dahlia Byes # [phone] 50M skin barrier wipes [name] # [phone] Stoma powder [name] # 6                                                         Urine Collection Bag (Hook to spout of pouch) [name] # [phone]   Instruction for "Crusting" with Stoma Powder and Skin Barrier Wipes: After cleaning around the stoma WITH WATER ONLY, sprinkle Stoma Powder on the irritated skin. Spread the powder around with your finger.  The powder will adhere ONLY to wet, raw skin.  Brush any loose powder off. DAB the powder that stuck to the irritated skin with a skin barrier wipe.  Do NOT rub, only dab the powder to moisten it. Allow the powder to air dry.  You can repeat the process of spreading powder and dabbing up to three times, allowing each process to air dry. Place the prepared pouching system around the stoma.   Thank you for the consult. [facility] nurse will not follow at this time.   Please re-consult the [facility] team if needed.  Cathlean Marseilles Tamala Julian, MSN, RN, [place], Lysle Pearl, [facility] Wound Treatment Associate Pager [phone]

## 2021-08-14 DIAGNOSIS — F418 Other specified anxiety disorders: Secondary | ICD-10-CM | POA: Diagnosis not present

## 2021-08-14 DIAGNOSIS — E876 Hypokalemia: Secondary | ICD-10-CM | POA: Diagnosis not present

## 2021-08-14 DIAGNOSIS — N179 Acute kidney failure, unspecified: Secondary | ICD-10-CM | POA: Diagnosis not present

## 2021-08-14 DIAGNOSIS — K50013 Crohn's disease of small intestine with fistula: Secondary | ICD-10-CM | POA: Diagnosis not present

## 2021-08-14 LAB — CBC WITH DIFFERENTIAL/PLATELET
Abs Immature Granulocytes: 0.05 10*3/uL (ref 0.00–0.07)
Basophils Absolute: 0 10*3/uL (ref 0.0–0.1)
Basophils Relative: 0 %
Eosinophils Absolute: 0.2 10*3/uL (ref 0.0–0.5)
Eosinophils Relative: 3 %
HCT: 27.1 % — ABNORMAL LOW (ref 36.0–46.0)
Hemoglobin: 9.3 g/dL — ABNORMAL LOW (ref 12.0–15.0)
Immature Granulocytes: 1 %
Lymphocytes Relative: 26 %
Lymphs Abs: 2.1 10*3/uL (ref 0.7–4.0)
MCH: 29.3 pg (ref 26.0–34.0)
MCHC: 34.3 g/dL (ref 30.0–36.0)
MCV: 85.5 fL (ref 80.0–100.0)
Monocytes Absolute: 0.4 10*3/uL (ref 0.1–1.0)
Monocytes Relative: 5 %
Neutro Abs: 5.3 10*3/uL (ref 1.7–7.7)
Neutrophils Relative %: 65 %
Platelets: 231 10*3/uL (ref 150–400)
RBC: 3.17 MIL/uL — ABNORMAL LOW (ref 3.87–5.11)
RDW: 12.8 % (ref 11.5–15.5)
WBC: 8.1 10*3/uL (ref 4.0–10.5)
nRBC: 0 % (ref 0.0–0.2)

## 2021-08-14 LAB — BASIC METABOLIC PANEL
Anion gap: 8 (ref 5–15)
BUN: 54 mg/dL — ABNORMAL HIGH (ref 8–23)
CO2: 17 mmol/L — ABNORMAL LOW (ref 22–32)
Calcium: 8.1 mg/dL — ABNORMAL LOW (ref 8.9–10.3)
Chloride: 98 mmol/L (ref 98–111)
Creatinine, Ser: 1.74 mg/dL — ABNORMAL HIGH (ref 0.44–1.00)
GFR, Estimated: 33 mL/min — ABNORMAL LOW (ref >60)
Glucose, Bld: 94 mg/dL (ref 70–99)
Potassium: 3.3 mmol/L — ABNORMAL LOW (ref 3.5–5.1)
Sodium: 123 mmol/L — ABNORMAL LOW (ref 135–145)

## 2021-08-14 LAB — MAGNESIUM: Magnesium: 1.5 mg/dL — ABNORMAL LOW (ref 1.7–2.4)

## 2021-08-14 MED ORDER — LOPERAMIDE HCL 2 MG PO CAPS
2.0000 mg | ORAL_CAPSULE | Freq: Three times a day (TID) | ORAL | Status: DC
  Administered 2021-08-14 – 2021-08-16 (×7): 2 mg via ORAL
  Filled 2021-08-14 (×7): qty 1

## 2021-08-14 MED ORDER — CHOLESTYRAMINE 4 G PO PACK
4.0000 g | PACK | Freq: Two times a day (BID) | ORAL | Status: DC
  Administered 2021-08-14 (×2): 4 g via ORAL
  Filled 2021-08-14 (×4): qty 1

## 2021-08-14 MED ORDER — PSYLLIUM 95 % PO PACK
1.0000 | PACK | Freq: Every day | ORAL | Status: DC
  Administered 2021-08-14: 1 via ORAL
  Filled 2021-08-14: qty 1

## 2021-08-14 MED ORDER — ADULT MULTIVITAMIN W/MINERALS CH
1.0000 | ORAL_TABLET | Freq: Every day | ORAL | Status: DC
  Administered 2021-08-14 – 2021-08-20 (×7): 1 via ORAL
  Filled 2021-08-14 (×7): qty 1

## 2021-08-14 MED ORDER — SODIUM CHLORIDE 0.9 % IV SOLN: INTRAVENOUS | Status: DC

## 2021-08-14 MED ORDER — POTASSIUM CHLORIDE CRYS ER 20 MEQ PO TBCR
40.0000 meq | EXTENDED_RELEASE_TABLET | Freq: Two times a day (BID) | ORAL | Status: AC
  Administered 2021-08-14 – 2021-08-15 (×4): 40 meq via ORAL
  Filled 2021-08-14 (×4): qty 2

## 2021-08-14 MED ORDER — TRAMADOL HCL 50 MG PO TABS
50.0000 mg | ORAL_TABLET | Freq: Once | ORAL | Status: AC | PRN
  Administered 2021-08-14: 50 mg via ORAL
  Filled 2021-08-14: qty 1

## 2021-08-14 MED ORDER — MAGNESIUM SULFATE 2 GM/50ML IV SOLN
2.0000 g | Freq: Once | INTRAVENOUS | Status: AC
  Administered 2021-08-14: 2 g via INTRAVENOUS
  Filled 2021-08-14: qty 50

## 2021-08-14 MED ORDER — LACTATED RINGERS IV SOLN: INTRAVENOUS | Status: DC

## 2021-08-14 NOTE — Progress Notes (Addendum)
Initial Nutrition Assessment  DOCUMENTATION CODES:   Severe malnutrition in context of chronic illness, Underweight  INTERVENTION:   Ensure Enlive (chocolate) po TID, each supplement provides 350 kcal and 20 grams of protein  Magic cup BID with meals, each supplement provides 290 kcal and 9 grams of protein  Multivitamin w/ minerals Daily  Check Vitamin A, B1, B12, C, Zinc, and Copper labs. Received ok from MD.   Encourage good PO intake  Oral rehydration solution ordered  as per GI.  Consider TPN if pt unable to gain weight due to high ileostomy output.   NUTRITION DIAGNOSIS:   Severe Malnutrition related to chronic illness as evidenced by severe muscle depletion, severe fat depletion.  GOAL:   Patient will meet greater than or equal to 90% of their needs  MONITOR:   PO intake, I & O's, Supplement acceptance, Weight trends  REASON FOR ASSESSMENT:   Malnutrition Screening Tool    ASSESSMENT:   60 y.o. female presented to the ED with confusion, weakness, and issues with ileostomy. 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 Pt recently admitted 9/6-9/11 for AKI, hyponatremia, hyperkalemia, and hypovolemia. Pt admitted with acute renal failure.   Pt reports that her appetite is poor and that she cannot eat when having an episode of nausea. These episodes of nausea occur for a few days and happens every couple of weeks, when they happen she usually ends up in the hospital dehydrated. Pt reports a typical intake of, Breakfast: sausage biscuit sandwich or poptart, Lunch: sandwich, Dinner: whatever roommate cooks, ex chicken and green beans. Pt reports drinking 3 Ensures/day, stated that she tolerates them better during an episode than plain water.    Per EMR, pt intake includes: 10/11: Dinner 50% 10/12: Breakfast 100%, Lunch 10%  Pt reports that her UBW is 140# and now weighs 99#. Stated that all of this occurred after  she had the ostomy in April. Per EMR, pt has had 26% weight loss in 6 months, this is clinically significant for time frame.    Pt reports that she is having extreme output in her ostomy. States that is she goes to the bathroom 3 times in a day she will have to empty her ostomy bag every time.   Discussed ONS with pt, pt will continue Ensure and agreed to try [employer].   Discussed that Gatorade or Propel water are good sources to drink when having an episode of nausea to help with dehydration and repletion of electrolytes.   GI evaluated pt, GI ordered a oral rehydration of Gatorade and Table salt, fiber supplement, Imodium, and Zofran. Potentially will need Octreotide.  Pt currently on IV antibiotics due to cellulitis at stoma.   Medications reviewed and include: Protonix, Psyllium, Potassium Chloride, IV antibiotics, Imodium, Questran  Labs reviewed: Sodium 123, Potassium 3.3, BUN 54, Creatinine 1.74, Magnesium 1.5  Ostomy Output: 2550 mL x 24 hr  NUTRITION - FOCUSED PHYSICAL EXAM:  Flowsheet Row Most Recent Value  Orbital Region Severe depletion  Upper Arm Region Severe depletion  Thoracic and Lumbar Region Severe depletion  Buccal Region Severe depletion  Temple Region Severe depletion  Clavicle Bone Region Severe depletion  Clavicle and Acromion Bone Region Severe depletion  Scapular Bone Region Severe depletion  Dorsal Hand Severe depletion  Patellar Region Severe depletion  Anterior Thigh Region Severe depletion  Posterior Calf Region Severe depletion  Edema (RD Assessment) None  Hair Reviewed  Eyes Reviewed  Mouth  Reviewed  Skin Reviewed  Nails Reviewed       Diet Order:   Diet Order             Diet regular Room service appropriate? Yes; Fluid consistency: Thin  Diet effective now                   EDUCATION NEEDS:   No education needs have been identified at this time  Skin:  Skin Assessment: Reviewed RN Assessment  Last BM:  08/14/21 (2250 mL  via ileostomy x 24 hr)  Height:   Ht Readings from Last 1 Encounters:  08/12/21 5' 4"  (1.626 m)    Weight:   Wt Readings from Last 1 Encounters:  08/14/21 44.9 kg    Ideal Body Weight:  54.5 kg  BMI:  Body mass index is 16.99 kg/m.  Estimated Nutritional Needs:   Kcal:  1500-1700  Protein:  85-100 grams  Fluid:  >/= 1.5 L    Tully Mcinturff BS, PLDN Clinical Dietitian See AMiON for contact information.

## 2021-08-14 NOTE — Consult Note (Signed)
[place] Nurse ostomy follow up Patient receiving care in [place] on patient this AM. HOP ostomy pouch still intact without any leakage. Connected to bedside drainage bag and still has thin yellow stool. WOC will not follow at this time but are available to this patient and the medical team if needed. [name] numbers for supplies are in Nursing Care Orders.  Please re-consult if needed.  Cathlean Marseilles Tamala Julian, MSN, RN, [place], Lysle Pearl, [facility] Wound Treatment Associate Pager [phone]

## 2021-08-14 NOTE — Consult Note (Signed)
   [facility] CM Inpatient Consult   08/14/2021  Cynthia [name] [facility] [birth year] [id number]   Patient with banner:  No longer with [employer], has Medicaid [place] [not an ACO [facility] insurer]   Reason:  Not a beneficiary currently attributed to one of the [place] populations.  Notified inpatient TOC team of insurance change and [facility] status.  For questions,  Natividad Brood, RN BSN [facility] Liaison  [phone] business mobile phone Toll free office [phone]  Fax number: [phone] [email or web address]

## 2021-08-14 NOTE — Consult Note (Signed)
[place] Nurse ostomy follow up Re-consulted again today for leakage and skin breakdown. This patient was seen yesterday, pouch changed, crusting completed and very detailed orders placed with [name] #s for supplies and instructions on how to crust. Today none of the supplies were in the room and have never been ordered. Spoke with the Network engineer and gave [name] #s for her to order supplies to be placed in the patient room. Stoma type/location: RLQ end ileostomy Stomal assessment/size: 1 1/8" round budded Peristomal assessment: denuded around the stoma and to the left of the stoma  Treatment options for stomal/peristomal skin: Crusting and barrier ring Output; thin yellow  Ostomy pouching: 2pc. 2 1/4"  Convex HOP connected to bedside drainage bag.  2 pc convex skin barrier not in [facility] Formulary. 3 taken to patient room from [place]. If more is needed will need to change to a one piece convex ([name]# [phone])  Re-consult if needed.   Cynthia Marseilles Tamala Julian, MSN, RN, [place], Lysle Pearl, [facility] Wound Treatment Associate Pager [phone]

## 2021-08-14 NOTE — Consult Note (Addendum)
Consultation  Referring Provider: Dr. Sloan Leiter    Primary Care Physician:  Denita Lung, MD Primary Gastroenterologist: Dr. Fuller Plan        Reason for Consultation: High output ostomy            HPI:   Cynthia [name] is a 60 y.o. 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  We are consulted in regards to her high output ostomy.    At time of admission patient reported 3 days or so of nausea, vomiting and diarrhea and had 2 days of redness at her stoma and the skin around her stoma.  Of note she was recently admitted from 9/6 until 9/11 for AKI, hyponatremia, hyperkalemia, hypovolemia in the setting of nausea, vomiting and diarrhea in the setting of her ileostomy and bowel resection in April of this year.  Initially went to the ICU and required fluid and vasopressors was weaned off and transferred out to the hospital service.    Today, the patient tells me "I know you guys are mad at me about missing my appointments", tells me that anytime that she has had an appointment she has been unable to come because she is having an episode of vomiting/high ostomy output that makes her unable to come in.  Tells me "I know I should have called".  Explains that she goes through these "cycles", which start first right when she is waking up with pain around her ostomy site, typically "on the outside", she will then move to her chair and start with nausea and vomiting all day long and her output seems higher.  This can last for couple weeks at a time and eventually after 3 or 4 days she has to come to the hospital and after a couple of weeks things seem to subside.  Explains that she does have times when she is not having any of the symptoms.  Tells me she is not have any  medicine at home to take when the symptoms start.  Has nothing for nausea and does not use Imodium for diarrhea.  Believes these medications do help though when she has them.    Currently, having no further nausea or vomiting since admission but her output still seems "high", per her.  Also tells me she is having difficulty with leakage around her ostomy site, "redness that is almost an infection".  Tells me that typically she changes her ostomy bag every 2 days at home but when she is having "problems", she has to change it once a day.  Asked me what we are going to do about her Crohn's going forward.  Denies any abdominal pain.    Denies fever, chills, weight loss or blood in her stool.  ED course: Heart rate 80s-140s, labs with a CMP sodium 121, potassium 2.9, chloride 83, bicarb 21, BUN 110, creatinine 3.1 (baseline 1.2), CBC with leukocytosis 13.7, CT abdomen pelvis showed no acute abnormality but did demonstrate known history of subtotal colectomy with ileostomy and abdominal fistula with an air-fluid level in the bladder noted in the setting of recent catheterization, she was started on Vanco and cefepime  GI history: 07/11/2021 consult by our service for Crohn's disease: It was discussed that patient had complicated Crohn's ileitis with enteroenteric fistula and high-grade distal  small bowel obstructions, she had lost response to Humira and Stelara requiring multiple hospitalizations for ongoing symptoms and SBO's, had ileocecectomy with ileostomy at [facility] in April 2022 and had since struggled with nausea/vomiting and high ileostomy output, at that time was her third admission since surgery for AKI/dehydration she had not been on any Crohn's meds since prior to her bowel surgery at that time discussed that she had persistent increased output and recommendations were for low-dose steroids, cholestyramine and octreotide  08/29/2019 colonoscopy -There was significant looping of the colon. -  Two 3 to 4 mm polyps in the rectum and at the recto-sigmoid colon, removed with a cold snare. Resected and retrieved. - Vascular-pattern-decreased mucosa in the descending colon, in the transverse colon, in the ascending colon and in the cecum. Biopsied. - Ileitis. Biopsied. Clinical presentation and endoscopic appearance highly suspicious for Crohns Disease.  Past Medical History:  Diagnosis Date   Anemia 2012   Anxiety    Bronchitis    Crohn's ileitis ([place])    Depression    GERD (gastroesophageal reflux disease)    Headache(784.0)    MIGRAINE   Heart murmur    Hiatal hernia    Hypertension    IBS (irritable bowel syndrome)    Incontinence    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HCC)    MVP (mitral valve prolapse)    RLS (restless legs syndrome)    Tubular adenoma of colon 08/2019    Past Surgical History:  Procedure Laterality Date   ABDOMINAL HYSTERECTOMY  06/17/2011   Procedure: HYSTERECTOMY ABDOMINAL;  Surgeon: Arloa Koh;  Location: [facility];  Service: Gynecology;  Laterality: N/A;  Converted to Abdominal Hysterectomy with lysis of adhesions    ANTERIOR AND POSTERIOR REPAIR  02/25/2012   Procedure: ANTERIOR (CYSTOCELE) AND POSTERIOR REPAIR (RECTOCELE);  Surgeon: Daria Pastures, MD;  Location: [facility];  Service: Gynecology;  Laterality: N/A;  ANterior cystocele repair ONLY   BIOPSY  08/29/2019   Procedure: BIOPSY;  Surgeon: Lavena Bullion, DO;  Location: [facility];  Service: Gastroenterology;;   BLADDER SUSPENSION  02/25/2012   Procedure: TRANSVAGINAL TAPE (TVT) PROCEDURE;  Surgeon: Daria Pastures, MD;  Location: [facility];  Service: Gynecology;  Laterality: N/A;   COLONOSCOPY WITH PROPOFOL N/A 08/29/2019   Procedure: COLONOSCOPY WITH PROPOFOL;  Surgeon: Lavena Bullion, DO;  Location: [facility];  Service: Gastroenterology;  Laterality: N/A;   CYSTOSCOPY  02/25/2012   Procedure: CYSTOSCOPY;  Surgeon: Daria Pastures, MD;  Location: [facility];  Service: Gynecology;   Laterality: N/A;   ILEOSTOMY     KNEE ARTHROSCOPY  2010   LAPAROSCOPIC ASSISTED VAGINAL HYSTERECTOMY  06/17/2011   Procedure: LAPAROSCOPIC ASSISTED VAGINAL HYSTERECTOMY;  Surgeon: Arloa Koh;  Location: [facility];  Service: Gynecology;  Laterality: N/A;  Attempted    POLYPECTOMY  08/29/2019   Procedure: POLYPECTOMY;  Surgeon: Lavena Bullion, DO;  Location: [facility];  Service: Gastroenterology;;   SALPINGOOPHORECTOMY  06/17/2011   Procedure: SALPINGO OOPHERECTOMY;  Surgeon: Arloa Koh;  Location: [facility];  Service: Gynecology;  Laterality: Bilateral;   TONSILLECTOMY      Family History  Problem Relation Age of Onset   Diabetes Mother    Dementia Mother    Clotting disorder Father    Macular degeneration Father    Crohn's disease Sister    Colon cancer Neg Hx    Esophageal cancer Neg Hx    Pancreatic cancer Neg Hx    Stomach cancer Neg Hx  Liver disease Neg Hx     Social History   Tobacco Use   Smoking status: Some Days    Packs/day: 0.00    Types: Cigarettes   Smokeless tobacco: Never   Tobacco comments:    none in 2 weeks as of 12/04/20  Vaping Use   Vaping Use: Former  Substance Use Topics   Alcohol use: Not Currently   Drug use: Not Currently    Types: Cocaine    Comment: clean x 8 weeks    Prior to Admission medications   Medication Sig Start Date End Date Taking? Authorizing Provider  acetaminophen (TYLENOL) 500 MG tablet Take 1,000 mg by mouth every 6 (six) hours as needed for headache (pain).   Yes [provider]  ibuprofen (ADVIL) 200 MG tablet Take 400 mg by mouth every 6 (six) hours as needed (pain).   Yes [provider]  pramipexole (MIRAPEX) 1 MG tablet Take 1 tablet (1 mg total) by mouth daily. Patient taking differently: Take 1 mg by mouth at bedtime. 03/05/21  Yes Denita Lung, MD  venlafaxine XR (EFFEXOR-XR) 150 MG 24 hr capsule Take 2 capsules (300 mg total) by mouth daily with breakfast. 03/05/21  Yes Denita Lung, MD   esomeprazole (NEXIUM) 40 MG capsule Take 1 capsule (40 mg total) by mouth 2 (two) times daily before a meal. Patient not taking: Reported on 08/12/2021 07/14/21 08/13/21  Arrien, Jimmy Picket, MD  loperamide (IMODIUM) 2 MG capsule Take 1 capsule (2 mg total) by mouth 2 (two) times daily. Patient not taking: No sig reported 07/14/21   Arrien, Jimmy Picket, MD    Current Facility-Administered Medications  Medication Dose Route Frequency Provider Last Rate Last Admin   0.9 %  sodium chloride infusion   Intravenous Continuous Barb Merino, MD 100 mL/hr at 08/14/21 0928 New Bag at 08/14/21 0928   acetaminophen (TYLENOL) tablet 650 mg  650 mg Oral Q6H PRN Marcelyn Bruins, MD   650 mg at 08/13/21 2345   Or   acetaminophen (TYLENOL) suppository 650 mg  650 mg Rectal Q6H PRN Marcelyn Bruins, MD       cefTRIAXone (ROCEPHIN) 1 g in sodium chloride 0.9 % 100 mL IVPB  1 g Intravenous Q24H Marcelyn Bruins, MD 200 mL/hr at 08/14/21 0624 1 g at 08/14/21 0624   enoxaparin (LOVENOX) injection 30 mg  30 mg Subcutaneous Q24H Marcelyn Bruins, MD   30 mg at 08/13/21 1946   feeding supplement (ENSURE ENLIVE / ENSURE PLUS) liquid 237 mL  237 mL Oral TID BM Marcelyn Bruins, MD   237 mL at 08/14/21 0037   loperamide (IMODIUM) capsule 2 mg  2 mg Oral TID Barb Merino, MD       magnesium sulfate IVPB 2 g 50 mL  2 g Intravenous Once Barb Merino, MD       pantoprazole (PROTONIX) EC tablet 40 mg  40 mg Oral Daily Marcelyn Bruins, MD   40 mg at 08/14/21 0488   potassium chloride SA (KLOR-CON) CR tablet 40 mEq  40 mEq Oral BID Barb Merino, MD       pramipexole (MIRAPEX) tablet 1 mg  1 mg Oral Daily Marcelyn Bruins, MD   1 mg at 08/14/21 8916   sodium chloride flush (NS) 0.9 % injection 3 mL  3 mL Intravenous Q12H Marcelyn Bruins, MD   3 mL at 08/14/21 0928   venlafaxine XR (EFFEXOR-XR) 24 hr capsule 300 mg  300 mg Oral Q breakfast Marcelyn Bruins, MD   300 mg at 08/14/21 7915     Allergies as of 08/12/2021 - Review Complete 08/12/2021  Allergen Reaction Noted   Iodine Other (See Comments) 03/17/2008     Review of Systems:    Constitutional: No weight loss, fever or chills Skin: No rash  Cardiovascular: No chest pain Respiratory: No SOB Gastrointestinal: See HPI and otherwise negative Genitourinary: No dysuria  Neurological: No headache, dizziness or syncope Musculoskeletal: No new muscle or joint pain Hematologic: No bleeding  Psychiatric: Anxiety   Physical Exam:  Vital signs in last 24 hours: Temp:  [97.5 F (36.4 C)-98.3 F (36.8 C)] 98.3 F (36.8 C) (10/12 0749) Pulse Rate:  [72-93] 93 (10/12 0749) Resp:  [9-20] 17 (10/12 0652) BP: (81-113)/(51-83) 102/68 (10/12 0749) SpO2:  [83 %-100 %] 100 % (10/12 0749) Weight:  [44.9 kg-45.9 kg] 44.9 kg (10/12 0569)   General: Chronically ill-appearing Caucasian female appears to be in NAD, Well developed, Well nourished, alert and cooperative Head:  Normocephalic and atraumatic. Eyes:   PEERL, EOMI. No icterus. Conjunctiva pink. Ears:  Normal auditory acuity. Neck:  Supple Throat: Oral cavity and pharynx without inflammation, swelling or lesion.  Lungs: Respirations even and unlabored. Lungs clear to auscultation bilaterally.   No wheezes, crackles, or rhonchi.  Heart: Normal S1, S2. No MRG. Regular rate and rhythm. No peripheral edema, cyanosis or pallor.  Abdomen:  Soft, nondistended, tenderness around stoma, erythema, no rebound or guarding. Normal bowel sounds. No appreciable masses or hepatomegaly. Rectal:  Not performed.  Msk:  Symmetrical without gross deformities. Peripheral pulses intact.  Extremities:  Without edema, no deformity or joint abnormality.  Neurologic:  Alert and  oriented x4;  grossly normal neurologically.  Skin:   Dry and intact without significant lesions or rashes. Psychiatric: Demonstrates good judgement and reason without abnormal affect or behaviors.  LAB  RESULTS: Recent Labs    08/12/21 1510 08/12/21 1544 08/13/21 0408 08/14/21 0316  WBC 13.7*  --  10.8* 8.1  HGB 13.3 13.9 11.1* 9.3*  HCT 37.0 41.0 32.0* 27.1*  PLT 350  --  254 231   BMET Recent Labs    08/13/21 1311 08/13/21 1534 08/14/21 0316  NA 126* 126* 123*  K 3.5 3.4* 3.3*  CL 98 97* 98  CO2 18* 19* 17*  GLUCOSE 80 103* 94  BUN 72* 65* 54*  CREATININE 2.14* 2.10* 1.74*  CALCIUM 8.4* 8.3* 8.1*   LFT Recent Labs    08/12/21 1510  PROT 7.4  ALBUMIN 4.0  AST 21  ALT 14  ALKPHOS 58  BILITOT 0.5   PT/INR Recent Labs    08/12/21 1510  LABPROT 14.4  INR 1.1    STUDIES: CT ABDOMEN PELVIS WO CONTRAST  Result Date: 08/12/2021 CLINICAL DATA:  Disorientation, weakness, problems with ostomy EXAM: CT ABDOMEN AND PELVIS WITHOUT CONTRAST TECHNIQUE: Multidetector CT imaging of the abdomen and pelvis was performed following the standard protocol without IV contrast. COMPARISON:  07/10/2021 FINDINGS: Lower chest: No acute abnormality. Hepatobiliary: No solid liver abnormality is seen. Small gallstone in the gallbladder fundus. No gallbladder wall thickening, or biliary dilatation. Pancreas: Unremarkable. No pancreatic ductal dilatation or surrounding inflammatory changes. Spleen: Normal in size without significant abnormality. Adrenals/Urinary Tract: Adrenal glands are unremarkable. Kidneys are normal, without renal calculi, solid lesion, or hydronephrosis. Air-fluid level within the urinary bladder. Stomach/Bowel: Stomach is within normal limits. Status post subtotal colectomy with right lower quadrant end ileostomy and left hemiabdomen  mucous fistula. Vascular/Lymphatic: Scattered aortic atherosclerosis. No enlarged abdominal or pelvic lymph nodes. Reproductive: Status post hysterectomy. Other: No abdominal wall hernia or abnormality. No abdominopelvic ascites. Musculoskeletal: No acute or significant osseous findings. IMPRESSION: 1. Status post subtotal colectomy with right  lower quadrant end ileostomy and left hemiabdomen mucous fistula. No evidence of bowel obstruction or other complication. 2. Air-fluid level within the urinary bladder. Correlate for recent catheterization. 3. Cholelithiasis without evidence of acute cholecystitis. Aortic Atherosclerosis (ICD10-I70.0). Electronically Signed   By: Delanna Ahmadi M.D.   On: 08/12/2021 18:49   DG Chest Portable 1 View  Result Date: 08/12/2021 CLINICAL DATA:  Sepsis EXAM: PORTABLE CHEST 1 VIEW COMPARISON:  Chest x-ray 07/10/2021 FINDINGS: Interval removal of a right internal jugular approach central venous catheter. The heart and mediastinal contours are unchanged. No focal consolidation. No pulmonary edema. No pleural effusion. No pneumothorax. No acute osseous abnormality. IMPRESSION: No active disease. Electronically Signed   By: Iven Finn M.D.   On: 08/12/2021 16:26     Impression / Plan:   Impression: 1.  Hypokalemia/hyponatremia/high gap metabolic acidosis: Thought secondary to uremia, diarrhea likely contributing 2.  Crohn's disease: Status post subtotal colectomy and ileostomy in April 2022, now with recurrent episodes of dehydration from nausea, vomiting, diarrhea leading to admission for acute kidney injury and electrolyte abnormalities 3.  GERD: Maintained on a PPI 4.  Cellulitis: Around stoma  Plan: 1.  Discussed with the patient that I feel her main problem is that she has no medications at home to abate symptoms when they start.  Likely just starting Zofran 4 mg tabs 1-2 and Imodium 2 tabs in the morning when she wakes up and starts feeling bad would help her symptoms.  Essentially she sits at home until things worsen for 2 to 3 days and then ends up in the hospital. 2.  Currently patient is on Imodium 2 mg scheduled 3 times daily.  She does not feel like her output has slowed.  We will likely need to add other low-dose Prednisone, Octreotide or Cholestyramine to see if this helps.  Will discuss  further with Dr. Lorenso Courier. 3.  Agree with antibiotics given cellulitis 4.  Continue regular diet 5.  Started the patient on oral rehydration with G2 Gatorade and table salt.  Order in the computer. 6.  Start the patient on a fiber supplement daily. 7.  We will also start Cholestyramine twice daily  Thank you for your kind consultation, we will continue to follow.  Lavone Nian [facility]  08/14/2021, 9:34 AM

## 2021-08-14 NOTE — Progress Notes (Signed)
PROGRESS NOTE    LASHUNDRA SHIVELEY  JSE:[id number] DOB: 1960-12-23 DOA: 08/12/2021 PCP: Denita Lung, MD    Brief Narrative:  60 year old with anxiety, depression, Crohn's disease with fistula status post ileostomy and high output, hypertension, GERD, restless leg syndrome presented to ER with generalized fatigue, weakness, excessive output and nausea vomiting.  Found to have acute kidney injury, hyponatremia and multiple electrolyte abnormalities.   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 Baseline creatinine of 1.2 as per records.  Presented with creatinine 3.1. -Continue isotonic fluid 100 mL/h today.  Gradually improving.  Sodium is still low, anticipate to improve with improving potassium and magnesium.  Encourage oral intake.  Encourage fluid with salt. -Replace potassium aggressively and recheck levels. -Replace magnesium aggressively and recheck levels. -Patient does have very high output ileostomy causing recurrent hospitalizations and dehydration.  Start on Imodium 2 mg 3 times a day scheduled.  Will discuss with gastroenterology about further therapeutic management.  Cellulitis surrounding osteoma: Suspected.  On Rocephin.  Without any obvious evidence of infection.  Rocephin for 3 days.  Crohn's disease: Status post subtotal colectomy and ileostomy.  Consult GI.  Anxiety and depression: Continue venlafaxine from home.   DVT prophylaxis: enoxaparin (LOVENOX) injection 30 mg Start: 08/12/21 2000   Code Status: Full code Family  Communication: None Disposition Plan: Status is: Inpatient  Remains inpatient appropriate because:IV treatments appropriate due to intensity of illness or inability to take PO and Inpatient level of care appropriate due to severity of illness  Dispo: The patient is from: Home              Anticipated d/c is to: Home              Patient currently is not medically stable to d/c.   Difficult to place patient No         Consultants:  Gastroenterology  Procedures:  None  Antimicrobials:  Rocephin 10/11---   Subjective: Patient seen and examined in the morning rounds.  Denies any nausea vomiting.  Denies any abdominal pain.  Plenty of liquidy stool through the ileostomy.  Objective: Vitals:   08/14/21 0119 08/14/21 0652 08/14/21 0749 08/14/21 1220  BP: 106/61 102/73 102/68 109/76  Pulse: 82 79 93 82  Resp: 17 17    Temp: 97.9 F (36.6 C) 97.8 F (36.6 C) 98.3 F (36.8 C)   TempSrc: Oral Oral Oral   SpO2: 100% 96% 100% 100%  Weight: 45.9 kg 44.9 kg    Height:        Intake/Output Summary (Last 24 hours) at 08/14/2021 1330 Last data filed at 08/14/2021 1302 Gross per 24 hour  Intake 1975.9 ml  Output 3900 ml  Net -1924.1 ml   Filed Weights   08/12/21 1507 08/14/21 0119 08/14/21 0652  Weight: 47.6 kg 45.9 kg 44.9 kg    Examination:  General exam: Appears calm and comfortable  Frail and debilitated.  Chronically sick looking but not in any distress.  Cachectic. Respiratory system: Clear to auscultation. Respiratory effort normal. Cardiovascular system: S1 & S2 heard, RRR. No JVD,  murmurs, rubs, gallops or clicks. No pedal edema. Gastrointestinal system: Soft.  Nontender.  Large amount of liquidy stool on ileostomy. Central nervous system: Alert and oriented. No focal neurological deficits. Extremities: Symmetric 5 x 5 power. Skin: No rashes, lesions or ulcers Psychiatry: Judgement and insight appear normal. Mood & affect appropriate.     Data Reviewed: I  have personally reviewed following labs and imaging studies  CBC: Recent Labs  Lab 08/12/21 1510 08/12/21 1544 08/13/21 0408 08/14/21 0316  WBC 13.7*  --  10.8* 8.1  NEUTROABS 11.6*  --   --  5.3  HGB 13.3 13.9 11.1* 9.3*  HCT 37.0 41.0 32.0* 27.1*  MCV 82.6  --  85.6 85.5  PLT 350  --  254 446   Basic Metabolic Panel: Recent Labs  Lab 08/12/21 2038 08/13/21 0001 08/13/21 0401 08/13/21 0813 08/13/21 1311 08/13/21 1534 08/14/21 0316  NA 123*   < > 124* 124* 126* 126* 123*  K 3.2*   < > 4.1 3.7 3.5 3.4* 3.3*  CL 91*   < > 94* 96* 98 97* 98  CO2 18*   < > 15* 17* 18* 19* 17*  GLUCOSE 95   < > 74 65* 80 103* 94  BUN 97*   < > 89* 82* 72* 65* 54*  CREATININE 2.59*   < > 2.37* 2.43* 2.14* 2.10* 1.74*  CALCIUM 8.8*   < > 9.1 8.8* 8.4* 8.3* 8.1*  MG 1.9  --   --   --   --   --  1.5*   < > = values in this interval not displayed.   GFR: Estimated Creatinine Clearance: 24.1 mL/min (A) (by C-G formula based on SCr of 1.74 mg/dL (H)). Liver Function Tests: Recent Labs  Lab 08/12/21 1510  AST 21  ALT 14  ALKPHOS 58  BILITOT 0.5  PROT 7.4  ALBUMIN 4.0   No results for input(s): LIPASE, AMYLASE in the last 168 hours. No results for input(s): AMMONIA in the last 168 hours. Coagulation Profile: Recent Labs  Lab 08/12/21 1510  INR 1.1   Cardiac Enzymes: No results for input(s): CKTOTAL, CKMB, CKMBINDEX, TROPONINI in the last 168 hours. BNP (last 3 results) No results for input(s): PROBNP in the last 8760 hours. HbA1C: No results for input(s): HGBA1C in the last 72 hours. CBG: Recent Labs  Lab 08/13/21 0908 08/13/21 1109  GLUCAP 47* 99   Lipid Profile: No results for input(s): CHOL, HDL, LDLCALC, TRIG, CHOLHDL, LDLDIRECT in the last 72 hours. Thyroid Function Tests: No results for input(s): TSH, T4TOTAL, FREET4, T3FREE, THYROIDAB in the last 72 hours. Anemia Panel: No results for input(s): VITAMINB12, FOLATE, FERRITIN, TIBC, IRON, RETICCTPCT in the last 72  hours. Sepsis Labs: Recent Labs  Lab 08/12/21 1510 08/12/21 1906  LATICACIDVEN 2.9* 3.0*    Recent Results (from the past 240 hour(s))  Culture, blood (Routine x 2)     Status: None (Preliminary result)   Collection Time: 08/12/21  3:30 PM   Specimen: BLOOD LEFT HAND  Result Value Ref Range Status   Specimen Description BLOOD LEFT HAND  Final   Special Requests   Final    BOTTLES DRAWN AEROBIC AND ANAEROBIC Blood Culture results may not be optimal due to an inadequate volume of blood received in culture bottles   Culture   Final    NO GROWTH 2 DAYS Performed at [facility], [place] [address], [place], [place] [address]    Report Status PENDING  Incomplete  Culture, blood (Routine x 2)     Status: None (Preliminary result)   Collection Time: 08/12/21  3:43 PM   Specimen: BLOOD  Result Value Ref Range Status   Specimen Description BLOOD RIGHT ANTECUBITAL  Final   Special Requests   Final    BOTTLES DRAWN AEROBIC AND ANAEROBIC Blood Culture results may not be optimal due to an inadequate volume of blood received in culture bottles   Culture   Final    NO GROWTH 2 DAYS Performed at [facility], [place] [address], [place], [place] [address]    Report Status PENDING  Incomplete  Resp Panel by RT-PCR (Flu A&B, Covid) Nasopharyngeal Swab     Status: None   Collection Time: 08/12/21  5:16 PM   Specimen: Nasopharyngeal Swab; Nasopharyngeal(NP) swabs in vial transport medium  Result Value Ref Range Status   SARS Coronavirus 2 by RT PCR NEGATIVE NEGATIVE Final    Comment: (NOTE) SARS-CoV-2 target nucleic acids are NOT DETECTED.  The SARS-CoV-2 RNA is generally detectable in upper respiratory specimens during the acute phase of infection. The lowest concentration of SARS-CoV-2 viral copies this assay can detect is 138 copies/mL. A negative result does not preclude SARS-Cov-2 infection and should not be used as the sole basis for treatment or other patient management  decisions. A negative result may occur with  improper specimen collection/handling, submission of specimen other than nasopharyngeal swab, presence of viral mutation(s) within the areas targeted by this assay, and inadequate number of viral copies(<138 copies/mL). A negative result must be combined with clinical observations, patient history, and epidemiological information. The expected result is Negative.  Fact Sheet for Patients:  EntrepreneurPulse.com.au  Fact Sheet for Healthcare Providers:  IncredibleEmployment.be  This test is no t yet approved or cleared by the [place] FDA and  has been authorized for detection and/or diagnosis of SARS-CoV-2 by FDA under an Emergency Use Authorization (EUA). This EUA will remain  in effect (meaning this test can be used) for the duration of the COVID-19 declaration under Section 564(b)(1) of the Act, 21 U.S.C.section 360bbb-3(b)(1), unless the authorization is terminated  or revoked sooner.       Influenza A by PCR NEGATIVE NEGATIVE Final   Influenza B by PCR NEGATIVE NEGATIVE Final    Comment: (NOTE) The Xpert Xpress SARS-CoV-2/FLU/RSV plus assay is intended as an aid in the diagnosis of influenza from Nasopharyngeal swab specimens and should not be used as a sole basis for treatment. Nasal washings and aspirates are unacceptable for Xpert Xpress SARS-CoV-2/FLU/RSV testing.  Fact Sheet for Patients: EntrepreneurPulse.com.au  Fact Sheet for Healthcare Providers: IncredibleEmployment.be  This test is not yet approved or cleared by the [place] FDA and has been authorized for detection and/or diagnosis of SARS-CoV-2 by FDA under an Emergency Use Authorization (EUA). This EUA will remain in effect (meaning this test can be used) for the duration of the COVID-19 declaration under Section 564(b)(1) of the Act, 21 U.S.C. section 360bbb-3(b)(1), unless the  authorization is terminated or revoked.  Performed at [facility], [place] [address], [place], [place] [address]          Radiology Studies: CT ABDOMEN PELVIS WO CONTRAST  Result Date: 08/12/2021 CLINICAL DATA:  Disorientation, weakness, problems with ostomy EXAM: CT ABDOMEN AND PELVIS WITHOUT CONTRAST TECHNIQUE: Multidetector CT imaging of the abdomen and pelvis was performed following the standard protocol without IV contrast. COMPARISON:  07/10/2021 FINDINGS: Lower chest: No acute abnormality. Hepatobiliary: No solid  liver abnormality is seen. Small gallstone in the gallbladder fundus. No gallbladder wall thickening, or biliary dilatation. Pancreas: Unremarkable. No pancreatic ductal dilatation or surrounding inflammatory changes. Spleen: Normal in size without significant abnormality. Adrenals/Urinary Tract: Adrenal glands are unremarkable. Kidneys are normal, without renal calculi, solid lesion, or hydronephrosis. Air-fluid level within the urinary bladder. Stomach/Bowel: Stomach is within normal limits. 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 Vascular/Lymphatic: Scattered aortic atherosclerosis. No enlarged abdominal or pelvic lymph nodes. Reproductive: Status post hysterectomy. Other: No abdominal wall hernia or abnormality. No abdominopelvic ascites. Musculoskeletal: No acute or significant osseous findings. IMPRESSION: 1. 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 No evidence of bowel obstruction or other complication. 2. Air-fluid level within the urinary bladder. Correlate for recent catheterization. 3. Cholelithiasis without evidence of acute cholecystitis. Aortic Atherosclerosis (ICD10-I70.0). Electronically Signed   By: Delanna Ahmadi M.D.   On: 08/12/2021 18:49   DG Chest Portable 1 View  Result Date: 08/12/2021 CLINICAL DATA:  Sepsis EXAM:  PORTABLE CHEST 1 VIEW COMPARISON:  Chest x-ray 07/10/2021 FINDINGS: Interval removal of a right internal jugular approach central venous catheter. The heart and mediastinal contours are unchanged. No focal consolidation. No pulmonary edema. No pleural effusion. No pneumothorax. No acute osseous abnormality. IMPRESSION: No active disease. Electronically Signed   By: Iven Finn M.D.   On: 08/12/2021 16:26        Scheduled Meds:  cholestyramine  4 g Oral BID   enoxaparin (LOVENOX) injection  30 mg Subcutaneous Q24H   feeding supplement  237 mL Oral TID BM   loperamide  2 mg Oral TID   pantoprazole  40 mg Oral Daily   potassium chloride  40 mEq Oral BID   pramipexole  1 mg Oral Daily   psyllium  1 packet Oral Daily   sodium chloride flush  3 mL Intravenous Q12H   venlafaxine XR  300 mg Oral Q breakfast   Continuous Infusions:  sodium chloride 100 mL/hr at 08/14/21 0928   cefTRIAXone (ROCEPHIN)  IV 1 g (08/14/21 0624)     LOS: 1 day    Time spent: 32 minutes    Barb Merino, MD [facility] Pager [phone]

## 2021-08-15 DIAGNOSIS — N179 Acute kidney failure, unspecified: Secondary | ICD-10-CM | POA: Diagnosis not present

## 2021-08-15 DIAGNOSIS — K50013 Crohn's disease of small intestine with fistula: Secondary | ICD-10-CM | POA: Diagnosis not present

## 2021-08-15 DIAGNOSIS — F418 Other specified anxiety disorders: Secondary | ICD-10-CM | POA: Diagnosis not present

## 2021-08-15 DIAGNOSIS — E876 Hypokalemia: Secondary | ICD-10-CM | POA: Diagnosis not present

## 2021-08-15 LAB — BASIC METABOLIC PANEL
Anion gap: 5 (ref 5–15)
BUN: 35 mg/dL — ABNORMAL HIGH (ref 8–23)
CO2: 19 mmol/L — ABNORMAL LOW (ref 22–32)
Calcium: 8.3 mg/dL — ABNORMAL LOW (ref 8.9–10.3)
Chloride: 108 mmol/L (ref 98–111)
Creatinine, Ser: 1.48 mg/dL — ABNORMAL HIGH (ref 0.44–1.00)
GFR, Estimated: 40 mL/min — ABNORMAL LOW (ref >60)
Glucose, Bld: 83 mg/dL (ref 70–99)
Potassium: 4.7 mmol/L (ref 3.5–5.1)
Sodium: 132 mmol/L — ABNORMAL LOW (ref 135–145)

## 2021-08-15 LAB — CBC WITH DIFFERENTIAL/PLATELET
Abs Immature Granulocytes: 0.04 10*3/uL (ref 0.00–0.07)
Basophils Absolute: 0 10*3/uL (ref 0.0–0.1)
Basophils Relative: 0 %
Eosinophils Absolute: 0.2 10*3/uL (ref 0.0–0.5)
Eosinophils Relative: 2 %
HCT: 25.6 % — ABNORMAL LOW (ref 36.0–46.0)
Hemoglobin: 8.7 g/dL — ABNORMAL LOW (ref 12.0–15.0)
Immature Granulocytes: 1 %
Lymphocytes Relative: 32 %
Lymphs Abs: 2.2 10*3/uL (ref 0.7–4.0)
MCH: 30.2 pg (ref 26.0–34.0)
MCHC: 34 g/dL (ref 30.0–36.0)
MCV: 88.9 fL (ref 80.0–100.0)
Monocytes Absolute: 0.4 10*3/uL (ref 0.1–1.0)
Monocytes Relative: 6 %
Neutro Abs: 4.1 10*3/uL (ref 1.7–7.7)
Neutrophils Relative %: 59 %
Platelets: 197 10*3/uL (ref 150–400)
RBC: 2.88 MIL/uL — ABNORMAL LOW (ref 3.87–5.11)
RDW: 13.2 % (ref 11.5–15.5)
WBC: 7 10*3/uL (ref 4.0–10.5)
nRBC: 0 % (ref 0.0–0.2)

## 2021-08-15 LAB — VITAMIN B12: Vitamin B-12: 182 pg/mL (ref 180–914)

## 2021-08-15 LAB — MAGNESIUM: Magnesium: 2 mg/dL (ref 1.7–2.4)

## 2021-08-15 LAB — PHOSPHORUS: Phosphorus: 1.5 mg/dL — ABNORMAL LOW (ref 2.5–4.6)

## 2021-08-15 MED ORDER — MIDODRINE HCL 5 MG PO TABS
5.0000 mg | ORAL_TABLET | Freq: Three times a day (TID) | ORAL | Status: DC
  Administered 2021-08-15 – 2021-08-20 (×16): 5 mg via ORAL
  Filled 2021-08-15 (×17): qty 1

## 2021-08-15 MED ORDER — SODIUM CHLORIDE 0.9 % IV BOLUS
500.0000 mL | Freq: Once | INTRAVENOUS | Status: AC
  Administered 2021-08-15: 500 mL via INTRAVENOUS

## 2021-08-15 MED ORDER — PSYLLIUM 95 % PO PACK
1.0000 | PACK | Freq: Two times a day (BID) | ORAL | Status: DC
  Administered 2021-08-15 – 2021-08-20 (×11): 1 via ORAL
  Filled 2021-08-15 (×11): qty 1

## 2021-08-15 MED ORDER — SODIUM CHLORIDE 0.9 % IV SOLN
12.5000 mg | Freq: Once | INTRAVENOUS | Status: DC | PRN
  Filled 2021-08-15: qty 0.5

## 2021-08-15 MED ORDER — SODIUM PHOSPHATES 45 MMOLE/15ML IV SOLN
15.0000 mmol | Freq: Once | INTRAVENOUS | Status: AC
  Administered 2021-08-15: 15 mmol via INTRAVENOUS
  Filled 2021-08-15: qty 5

## 2021-08-15 MED ORDER — PROCHLORPERAZINE EDISYLATE 10 MG/2ML IJ SOLN
5.0000 mg | Freq: Once | INTRAMUSCULAR | Status: AC | PRN
  Administered 2021-08-15: 5 mg via INTRAVENOUS
  Filled 2021-08-15: qty 1

## 2021-08-15 MED ORDER — CHOLESTYRAMINE 4 G PO PACK
4.0000 g | PACK | Freq: Two times a day (BID) | ORAL | Status: DC
  Administered 2021-08-15 – 2021-08-18 (×8): 4 g via ORAL
  Filled 2021-08-15 (×9): qty 1

## 2021-08-15 NOTE — Progress Notes (Addendum)
PROGRESS NOTE        PATIENT DETAILS Name: Cynthia [name] Age: 60 y.o. Sex: female Date of Birth: 1961-01-08 Admit Date: 08/12/2021 Admitting Physician Lavina Hamman, MD HWT:[id number], Elyse Jarvis, MD  Brief Narrative: Patient is a 60 y.o. female with history of Crohn's disease-s/p subtotal colectomy-presented to the hospital with weakness, nausea/vomiting-found to have a high output ostomy, hyponatremia and AKI.  See below for further details.    Subjective: Lying comfortably in bed-denies any chest pain or shortness of breath.  Objective: Vitals: Blood pressure 94/61, pulse 78, temperature 97.7 F (36.5 C), temperature source Oral, resp. rate 16, height 5' 4"  (1.626 m), weight 46.2 kg, last menstrual period 06/10/2011, SpO2 100 %.   Exam: Gen Exam:Alert awake-not in any distress HEENT:atraumatic, normocephalic Chest: B/L clear to auscultation anteriorly CVS:S1S2 regular Abdomen:soft non tender, non distended Extremities:no edema Neurology: Non focal Skin: no rash  Pertinent Labs/Radiology: WBC: 7.0 Hb: 8.7 Na: 132 K: 4.7 Creatinine: 1.48  10/10>>Blood culture: No growth.  10/10>> CT abdomen/pelvis: No SBO.  Assessment/Plan: Hypotension with AKI/hyponatremia/hypokalemia/hypomagnesemia/hypophosphatemia anion gap acidosis: Due to high output ostomy-electrolytes slowly improving with decreasing ostomy output.  BP continues to be soft-however patient asymptomatic-we will start low-dose midodrine and follow.  Replete phosphorus today and recheck electrolytes tomorrow.  High output ostomy: Approximately 2.5 L of output in the past 24 hours-on scheduled Imodium and Metamucil.  GI following and contemplating starting cholestyramine.  Will await further recommendations from GI.  Peristomal cellulitis: Improved-hardly any erythema remains-completed 3 days of IV Rocephin.  Doubt requires any further antimicrobial therapy.   History of Crohn's disease:  We will defer to GI whether patient needs disease modifying agents.  Anxiety/depression: Appears stable-continue venlafaxine.  Nutrition Status: Nutrition Problem: Severe Malnutrition Etiology: chronic illness Signs/Symptoms: severe muscle depletion, severe fat depletion Interventions: MVI, Ensure Enlive (each supplement provides 350kcal and 20 grams of protein), Magic cup   Underweight: Estimated body mass index is 17.48 kg/m as calculated from the following:   Height as of this encounter: 5' 4"  (1.626 m).   Weight as of this encounter: 46.2 kg.     Procedures: None Consults: None DVT Prophylaxis: Lovenox Code Status:Full code Family Communication: None at bedside  Time spent: 35 minutes-Greater than 50% of this time was spent in counseling, explanation of diagnosis, planning of further management, and coordination of care.  Diet: Diet Order             Diet regular Room service appropriate? Yes; Fluid consistency: Thin  Diet effective now                      Disposition Plan: Status is: Inpatient  Remains inpatient appropriate because:Inpatient level of care appropriate due to severity of illness  Dispo: The patient is from: Home              Anticipated d/c is to: Home              Patient currently is not medically stable to d/c.   Difficult to place patient No   Barriers to Discharge: High output ostomy with numerous electrolyte disturbances-not yet stable for discharge remains on IVF.  Antimicrobial agents: Anti-infectives (From admission, onward)    Start     Dose/Rate Route Frequency Ordered Stop   08/13/21 0600  cefTRIAXone (ROCEPHIN) 1 g in sodium  chloride 0.9 % 100 mL IVPB        1 g 200 mL/hr over 30 Minutes Intravenous Every 24 hours 08/12/21 2022 08/20/21 0559   08/12/21 1621  vancomycin variable dose per unstable renal function (pharmacist dosing)  Status:  Discontinued         Does not apply See admin instructions 08/12/21 1621 08/12/21  2022   08/12/21 1615  ceFEPIme (MAXIPIME) 2 g in sodium chloride 0.9 % 100 mL IVPB        2 g 200 mL/hr over 30 Minutes Intravenous  Once 08/12/21 1605 08/12/21 1729   08/12/21 1615  vancomycin (VANCOREADY) IVPB 750 mg/150 mL  Status:  Discontinued        750 mg 150 mL/hr over 60 Minutes Intravenous  Once 08/12/21 1611 08/12/21 2103        MEDICATIONS: Scheduled Meds:  enoxaparin (LOVENOX) injection  30 mg Subcutaneous Q24H   feeding supplement  237 mL Oral TID BM   loperamide  2 mg Oral TID   midodrine  5 mg Oral TID WC   multivitamin with minerals  1 tablet Oral Daily   pantoprazole  40 mg Oral Daily   potassium chloride  40 mEq Oral BID   pramipexole  1 mg Oral Daily   psyllium  1 packet Oral BID   sodium chloride flush  3 mL Intravenous Q12H   venlafaxine XR  300 mg Oral Q breakfast   Continuous Infusions:  sodium chloride Stopped (08/15/21 0454)   cefTRIAXone (ROCEPHIN)  IV Stopped (08/15/21 0659)   promethazine (PHENERGAN) injection (IM or IVPB)     PRN Meds:.acetaminophen **OR** acetaminophen, promethazine (PHENERGAN) injection (IM or IVPB)   I have personally reviewed following labs and imaging studies  LABORATORY DATA: CBC: Recent Labs  Lab 08/12/21 1510 08/12/21 1544 08/13/21 0408 08/14/21 0316 08/15/21 0123  WBC 13.7*  --  10.8* 8.1 7.0  NEUTROABS 11.6*  --   --  5.3 4.1  HGB 13.3 13.9 11.1* 9.3* 8.7*  HCT 37.0 41.0 32.0* 27.1* 25.6*  MCV 82.6  --  85.6 85.5 88.9  PLT 350  --  254 231 956    Basic Metabolic Panel: Recent Labs  Lab 08/12/21 2038 08/13/21 0001 08/13/21 0813 08/13/21 1311 08/13/21 1534 08/14/21 0316 08/15/21 0123  NA 123*   < > 124* 126* 126* 123* 132*  K 3.2*   < > 3.7 3.5 3.4* 3.3* 4.7  CL 91*   < > 96* 98 97* 98 108  CO2 18*   < > 17* 18* 19* 17* 19*  GLUCOSE 95   < > 65* 80 103* 94 83  BUN 97*   < > 82* 72* 65* 54* 35*  CREATININE 2.59*   < > 2.43* 2.14* 2.10* 1.74* 1.48*  CALCIUM 8.8*   < > 8.8* 8.4* 8.3* 8.1* 8.3*   MG 1.9  --   --   --   --  1.5* 2.0  PHOS  --   --   --   --   --   --  1.5*   < > = values in this interval not displayed.    GFR: Estimated Creatinine Clearance: 29.1 mL/min (A) (by C-G formula based on SCr of 1.48 mg/dL (H)).  Liver Function Tests: Recent Labs  Lab 08/12/21 1510  AST 21  ALT 14  ALKPHOS 58  BILITOT 0.5  PROT 7.4  ALBUMIN 4.0   No results for input(s): LIPASE, AMYLASE in the last 168  hours. No results for input(s): AMMONIA in the last 168 hours.  Coagulation Profile: Recent Labs  Lab 08/12/21 1510  INR 1.1    Cardiac Enzymes: No results for input(s): CKTOTAL, CKMB, CKMBINDEX, TROPONINI in the last 168 hours.  BNP (last 3 results) No results for input(s): PROBNP in the last 8760 hours.  Lipid Profile: No results for input(s): CHOL, HDL, LDLCALC, TRIG, CHOLHDL, LDLDIRECT in the last 72 hours.  Thyroid Function Tests: No results for input(s): TSH, T4TOTAL, FREET4, T3FREE, THYROIDAB in the last 72 hours.  Anemia Panel: Recent Labs    08/15/21 0123  VITAMINB12 182    Urine analysis:    Component Value Date/Time   COLORURINE YELLOW 08/12/2021 1510   APPEARANCEUR HAZY (A) 08/12/2021 1510   LABSPEC 1.013 08/12/2021 1510   LABSPEC 1,025 12/18/2020 1644   PHURINE 5.0 08/12/2021 1510   GLUCOSEU 50 (A) 08/12/2021 1510   HGBUR NEGATIVE 08/12/2021 [place] 08/12/2021 1510   BILIRUBINUR small (A) 12/18/2020 1644   BILIRUBINUR n 12/04/2011 1014   KETONESUR NEGATIVE 08/12/2021 1510   PROTEINUR NEGATIVE 08/12/2021 1510   UROBILINOGEN negative 12/04/2011 1014   NITRITE NEGATIVE 08/12/2021 1510   LEUKOCYTESUR NEGATIVE 08/12/2021 1510    Sepsis Labs: Lactic Acid, Venous    Component Value Date/Time   LATICACIDVEN 3.0 (HH) 08/12/2021 1906    MICROBIOLOGY: Recent Results (from the past 240 hour(s))  Culture, blood (Routine x 2)     Status: None (Preliminary result)   Collection Time: 08/12/21  3:30 PM   Specimen: BLOOD  LEFT HAND  Result Value Ref Range Status   Specimen Description BLOOD LEFT HAND  Final   Special Requests   Final    BOTTLES DRAWN AEROBIC AND ANAEROBIC Blood Culture results may not be optimal due to an inadequate volume of blood received in culture bottles   Culture   Final    NO GROWTH 3 DAYS Performed at [facility], [place] [address], [place], [place] [address]    Report Status PENDING  Incomplete  Culture, blood (Routine x 2)     Status: None (Preliminary result)   Collection Time: 08/12/21  3:43 PM   Specimen: BLOOD  Result Value Ref Range Status   Specimen Description BLOOD RIGHT ANTECUBITAL  Final   Special Requests   Final    BOTTLES DRAWN AEROBIC AND ANAEROBIC Blood Culture results may not be optimal due to an inadequate volume of blood received in culture bottles   Culture   Final    NO GROWTH 3 DAYS Performed at [facility], [place] [address], [place], [place] [address]    Report Status PENDING  Incomplete  Resp Panel by RT-PCR (Flu A&B, Covid) Nasopharyngeal Swab     Status: None   Collection Time: 08/12/21  5:16 PM   Specimen: Nasopharyngeal Swab; Nasopharyngeal(NP) swabs in vial transport medium  Result Value Ref Range Status   SARS Coronavirus 2 by RT PCR NEGATIVE NEGATIVE Final    Comment: (NOTE) SARS-CoV-2 target nucleic acids are NOT DETECTED.  The SARS-CoV-2 RNA is generally detectable in upper respiratory specimens during the acute phase of infection. The lowest concentration of SARS-CoV-2 viral copies this assay can detect is 138 copies/mL. A negative result does not preclude SARS-Cov-2 infection and should not be used as the sole basis for treatment or other patient management decisions. A negative result may occur with  improper specimen collection/handling, submission of specimen other than nasopharyngeal swab, presence of viral mutation(s)  within the areas targeted by this assay, and inadequate number of viral copies(<138 copies/mL). A  negative result must be combined with clinical observations, patient history, and epidemiological information. The expected result is Negative.  Fact Sheet for Patients:  EntrepreneurPulse.com.au  Fact Sheet for Healthcare Providers:  IncredibleEmployment.be  This test is no t yet approved or cleared by the [place] FDA and  has been authorized for detection and/or diagnosis of SARS-CoV-2 by FDA under an Emergency Use Authorization (EUA). This EUA will remain  in effect (meaning this test can be used) for the duration of the COVID-19 declaration under Section 564(b)(1) of the Act, 21 U.S.C.section 360bbb-3(b)(1), unless the authorization is terminated  or revoked sooner.       Influenza A by PCR NEGATIVE NEGATIVE Final   Influenza B by PCR NEGATIVE NEGATIVE Final    Comment: (NOTE) The Xpert Xpress SARS-CoV-2/FLU/RSV plus assay is intended as an aid in the diagnosis of influenza from Nasopharyngeal swab specimens and should not be used as a sole basis for treatment. Nasal washings and aspirates are unacceptable for Xpert Xpress SARS-CoV-2/FLU/RSV testing.  Fact Sheet for Patients: EntrepreneurPulse.com.au  Fact Sheet for Healthcare Providers: IncredibleEmployment.be  This test is not yet approved or cleared by the [place] FDA and has been authorized for detection and/or diagnosis of SARS-CoV-2 by FDA under an Emergency Use Authorization (EUA). This EUA will remain in effect (meaning this test can be used) for the duration of the COVID-19 declaration under Section 564(b)(1) of the Act, 21 U.S.C. section 360bbb-3(b)(1), unless the authorization is terminated or revoked.  Performed at [facility], [place] [address] [place], [place] [address]     RADIOLOGY STUDIES/RESULTS: No results found.   LOS: 2 days   Oren Binet, MD  [facility]    To contact the attending  provider between 7A-7P or the covering provider during after hours 7P-7A, please log into the web site [email or web address] and access using universal [place] password for that web site. If you do not have the password, please call the hospital operator.  08/15/2021, 11:28 AM

## 2021-08-15 NOTE — Evaluation (Signed)
Physical Therapy Evaluation Patient Details Name: Cynthia [name] MRN: [id number] DOB: 28-Apr-1961 Today's Date: 08/15/2021  History of Present Illness  Pt adm 10/10 with AKI, electrolyte abnormalities, and confusion due to high output from ostomy. PMH - Crohn's, ileostomy, htn, anxiety, depression, recent hospitalization (9/6-9/11) with AKI and electrolyte abnormalities in setting of N/V/D.  Clinical Impression  Pt presents to PT with decr functional activity tolerance due to repeated hospitalizations and illnesses. Pt wants to regain strength and return to higher level of function. Encouraged pt to amb in hallways several times a day with staff.        Recommendations for follow up therapy are one component of a multi-disciplinary discharge planning process, led by the attending physician.  Recommendations may be updated based on patient status, additional functional criteria and insurance authorization.  Follow Up Recommendations Home health PT    Equipment Recommendations  None recommended by PT    Recommendations for Other Services       Precautions / Restrictions Precautions Precautions: Other (comment) Precaution Comments: ostomy      Mobility  Bed Mobility Overal bed mobility: Modified Independent                  Transfers Overall transfer level: Modified independent Equipment used: None                Ambulation/Gait Ambulation/Gait assistance: Supervision Gait Distance (Feet): 400 Feet Assistive device: None;4-wheeled walker Gait Pattern/deviations: Step-through pattern;Decreased stride length Gait velocity: decr Gait velocity interpretation: 1.31 - 2.62 ft/sec, indicative of limited community ambulator General Gait Details: In room pt amb modified independent using furniture for support. In hallway pt more hesitant so used rollator which incr confidence  Stairs            Wheelchair Mobility    Modified Rankin (Stroke Patients  Only)       Balance Overall balance assessment: Mild deficits observed, not formally tested                                           Pertinent Vitals/Pain Pain Assessment: No/denies pain    Home Living Family/patient expects to be discharged to:: Private residence Living Arrangements: Other (Comment) (roommate and roomate's 3 children - pt owns the home) Available Help at Discharge: Other (Comment) (roommate) Type of Home: House Home Access: Stairs to enter     Home Layout: One level Home Equipment: Environmental consultant - 4 wheels      Prior Function Level of Independence: Independent               Hand Dominance   Dominant Hand: Right    Extremity/Trunk Assessment   Upper Extremity Assessment Upper Extremity Assessment: Generalized weakness    Lower Extremity Assessment Lower Extremity Assessment: Generalized weakness       Communication   Communication: No difficulties  Cognition Arousal/Alertness: Awake/alert Behavior During Therapy: WFL for tasks assessed/performed Overall Cognitive Status: Within Functional Limits for tasks assessed                                        General Comments      Exercises Other Exercises Other Exercises: Repeated sit to stand x 5   Assessment/Plan    PT Assessment Patient needs continued PT services  PT  Problem List Decreased strength;Decreased mobility;Decreased activity tolerance       PT Treatment Interventions DME instruction;Functional mobility training;Patient/family education;Gait training;Therapeutic activities;Therapeutic exercise;Stair training    PT Goals (Current goals can be found in the Care Plan section)  Acute Rehab PT Goals Patient Stated Goal: Get stronger PT Goal Formulation: With patient Time For Goal Achievement: 08/22/21 Potential to Achieve Goals: Good    Frequency Min 3X/week   Barriers to discharge        Co-evaluation               AM-PAC PT  "6 Clicks" Mobility  Outcome Measure Help needed turning from your back to your side while in a flat bed without using bedrails?: None Help needed moving from lying on your back to sitting on the side of a flat bed without using bedrails?: None Help needed moving to and from a bed to a chair (including a wheelchair)?: None Help needed standing up from a chair using your arms (e.g., wheelchair or bedside chair)?: None Help needed to walk in hospital room?: None Help needed climbing 3-5 steps with a railing? : A Little 6 Click Score: 23    End of Session   Activity Tolerance: Patient tolerated treatment well Patient left: in bed;with call bell/phone within reach   PT Visit Diagnosis: Muscle weakness (generalized) (M62.81);Other abnormalities of gait and mobility (R26.89)    Time: 6384-6659 PT Time Calculation (min) (ACUTE ONLY): 18 min   Charges:   PT Evaluation $PT Eval Low Complexity: [place] Pager [phone] Office [place] 08/15/2021, 2:23 PM

## 2021-08-15 NOTE — Progress Notes (Signed)
Nutrition Follow-up  DOCUMENTATION CODES:   Severe malnutrition in context of chronic illness, Underweight  INTERVENTION:   Ensure Enlive (chocolate) po TID, each supplement provides 350 kcal and 20 grams of protein  Magic cup BID with meals, each supplement provides 290 kcal and 9 grams of protein  Multivitamin w/ minerals Daily  Encourage good PO intake  Oral rehydration solution ordered as per GI.  Consider TPN if pt unable to gain weight due to high ileostomy output.   Ileostomy nutrition therapy diet education provided including handout for pt.  NUTRITION DIAGNOSIS:   Severe Malnutrition related to chronic illness as evidenced by severe muscle depletion, severe fat depletion. - Ongoing  GOAL:   Patient will meet greater than or equal to 90% of their needs - Progressing  MONITOR:   PO intake, I & O's, Supplement acceptance, Weight trends  REASON FOR ASSESSMENT:   Malnutrition Screening Tool    ASSESSMENT:   60 y.o. female presented to the ED with confusion, weakness, and issues with ileostomy. 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 Pt recently admitted 9/6-9/11 for AKI, hyponatremia, hyperkalemia, and hypovolemia. Pt admitted with acute renal failure.   Pt reports that she has been eating a lot better while here, stated "Im eating like a horse."  Per EMR, pt intake includes: 10/13: Breakfast 20%, Lunch 100%  Pt continuing to drink Ensure supplements. Discussed Magic Cup, pt reports not trying it yet, tray was removed prior to being able to get to it.   Pt reports that she had the oral rehydration this morning, it was not the best but she still drank it.   Provided pt with ileostomy nutrition therapy education. Discussed way to replenish when dehydrated, bulk stools, and prevent increase ostomy output. Pt reports understanding of diet education and did not have any additional questions at this time.    Medications reviewed and include: Protonix, Psyllium, Potassium Chloride, Imodium, Questran, MVI  Labs reviewed: Sodium 132, Potassium 4.7, BUN 35, Creatinine 1.48, Magnesium 2, Phosphorus 1.5, Vitamin B12 182  Ostomy Output: 2500 mL x 24 hr  Admission Weight: 45.9 kg Current Weight: 46.2 kg  Diet Order:   Diet Order             Diet regular Room service appropriate? Yes; Fluid consistency: Thin  Diet effective now                   EDUCATION NEEDS:   Education needs have been addressed  Skin:  Skin Assessment: Reviewed RN Assessment  Last BM:  08/15/21 (2500 mL via ileostomy x 24 hr)  Height:   Ht Readings from Last 1 Encounters:  08/12/21 5' 4"  (1.626 m)    Weight:   Wt Readings from Last 1 Encounters:  08/15/21 46.2 kg    Ideal Body Weight:  54.5 kg  BMI:  Body mass index is 17.48 kg/m.  Estimated Nutritional Needs:   Kcal:  1500-1700  Protein:  85-100 grams  Fluid:  >/= 1.5 L    Wynter Grave BS, PLDN Clinical Dietitian See AMiON for contact information.

## 2021-08-15 NOTE — Progress Notes (Signed)
Pt BP 88/61.  MD notified.  500cc bolus ordered.

## 2021-08-15 NOTE — Telephone Encounter (Signed)
Letter created and signed.  Will send to medical records for processing.

## 2021-08-15 NOTE — Progress Notes (Signed)
    Progress Note   Subjective  Chief Complaint: High ostomy output  Today, the patient tells me that she is feeling better, no further nausea or vomiting and her external abdominal pain is much better with less redness.  She tells me "now I just have to do my part".  Tells me that it feels like some of her output has gained some consistency.   Objective   Vital signs in last 24 hours: Temp:  [97.4 F (36.3 C)-97.8 F (36.6 C)] 97.7 F (36.5 C) (10/13 0806) Pulse Rate:  [78-84] 78 (10/13 0620) Resp:  [16-19] 16 (10/13 0444) BP: (86-109)/(54-76) 94/61 (10/13 0806) SpO2:  [100 %] 100 % (10/13 0444) Weight:  [46.2 kg] 46.2 kg (10/13 0444) Last BM Date: 08/14/21 General:    white female in NAD Heart:  Regular rate and rhythm; no murmurs Lungs: Respirations even and unlabored, lungs CTA bilaterally Abdomen:  Soft, nontender and nondistended. Normal bowel sounds.+ Ostomy with copious amounts of liquid stool, erythema over her abdomen is better Psych:  Cooperative. Normal mood and affect.  Intake/Output from previous day: 10/12 0701 - 10/13 0700 In: 4853.2 [P.O.:2459; I.V.:1571.8; IV Piggyback:822.4] Out: 3040 [Urine:540; Stool:2500]   Lab Results: Recent Labs    08/13/21 0408 08/14/21 0316 08/15/21 0123  WBC 10.8* 8.1 7.0  HGB 11.1* 9.3* 8.7*  HCT 32.0* 27.1* 25.6*  PLT 254 231 197   BMET Recent Labs    08/13/21 1534 08/14/21 0316 08/15/21 0123  NA 126* 123* 132*  K 3.4* 3.3* 4.7  CL 97* 98 108  CO2 19* 17* 19*  GLUCOSE 103* 94 83  BUN 65* 54* 35*  CREATININE 2.10* 1.74* 1.48*  CALCIUM 8.3* 8.1* 8.3*   LFT Recent Labs    08/12/21 1510  PROT 7.4  ALBUMIN 4.0  AST 21  ALT 14  ALKPHOS 58  BILITOT 0.5   PT/INR Recent Labs    08/12/21 1510  LABPROT 14.4  INR 1.1    Assessment / Plan:   Assessment: 1.  Crohn's disease: Status post subtotal colectomy and ileostomy in April 2022, now with recurrent episodes of dehydration from nausea/vomiting/diarrhea  leading to admission for AKI and electrolyte abnormalities, some improvement per patient though per observation today it appears she still has copious amounts of liquid stool 2.  Cellulitis: Seems improved  Plan: 1.  We had added a fiber supplement, oral rehydration yesterday.  She is also continued on Imodium 3 times daily.  Patient feels like consistency of output is better but on observation the still looks very liquidy.  May need to reconsider adding back in the cholestyramine. 2.  Continue regular diet 3.  Continue oral rehydration with G2 Gatorade and table salt 4.  Dr. Lorenso Courier as ordered social work consult given patient tells that she cannot afford medicines at home  Thank you for your kind consultation, we will continue to follow    LOS: 2 days   Cynthia [name]  Cynthia [name], 10:07 AM

## 2021-08-16 DIAGNOSIS — F418 Other specified anxiety disorders: Secondary | ICD-10-CM | POA: Diagnosis not present

## 2021-08-16 DIAGNOSIS — E876 Hypokalemia: Secondary | ICD-10-CM | POA: Diagnosis not present

## 2021-08-16 DIAGNOSIS — N179 Acute kidney failure, unspecified: Secondary | ICD-10-CM | POA: Diagnosis not present

## 2021-08-16 DIAGNOSIS — K50013 Crohn's disease of small intestine with fistula: Secondary | ICD-10-CM | POA: Diagnosis not present

## 2021-08-16 LAB — BASIC METABOLIC PANEL
Anion gap: 5 (ref 5–15)
BUN: 25 mg/dL — ABNORMAL HIGH (ref 8–23)
CO2: 17 mmol/L — ABNORMAL LOW (ref 22–32)
Calcium: 8.3 mg/dL — ABNORMAL LOW (ref 8.9–10.3)
Chloride: 110 mmol/L (ref 98–111)
Creatinine, Ser: 1.41 mg/dL — ABNORMAL HIGH (ref 0.44–1.00)
GFR, Estimated: 42 mL/min — ABNORMAL LOW (ref >60)
Glucose, Bld: 58 mg/dL — ABNORMAL LOW (ref 70–99)
Potassium: 4.6 mmol/L (ref 3.5–5.1)
Sodium: 132 mmol/L — ABNORMAL LOW (ref 135–145)

## 2021-08-16 LAB — CBC WITH DIFFERENTIAL/PLATELET
Abs Immature Granulocytes: 0.05 10*3/uL (ref 0.00–0.07)
Basophils Absolute: 0 10*3/uL (ref 0.0–0.1)
Basophils Relative: 0 %
Eosinophils Absolute: 0.2 10*3/uL (ref 0.0–0.5)
Eosinophils Relative: 2 %
HCT: 25.8 % — ABNORMAL LOW (ref 36.0–46.0)
Hemoglobin: 8.4 g/dL — ABNORMAL LOW (ref 12.0–15.0)
Immature Granulocytes: 1 %
Lymphocytes Relative: 24 %
Lymphs Abs: 2.2 10*3/uL (ref 0.7–4.0)
MCH: 29.8 pg (ref 26.0–34.0)
MCHC: 32.6 g/dL (ref 30.0–36.0)
MCV: 91.5 fL (ref 80.0–100.0)
Monocytes Absolute: 0.5 10*3/uL (ref 0.1–1.0)
Monocytes Relative: 5 %
Neutro Abs: 6.5 10*3/uL (ref 1.7–7.7)
Neutrophils Relative %: 68 %
Platelets: 187 10*3/uL (ref 150–400)
RBC: 2.82 MIL/uL — ABNORMAL LOW (ref 3.87–5.11)
RDW: 13.8 % (ref 11.5–15.5)
WBC: 9.5 10*3/uL (ref 4.0–10.5)
nRBC: 0 % (ref 0.0–0.2)

## 2021-08-16 LAB — ZINC: Zinc: 49 ug/dL (ref 44–115)

## 2021-08-16 LAB — MAGNESIUM: Magnesium: 1.4 mg/dL — ABNORMAL LOW (ref 1.7–2.4)

## 2021-08-16 LAB — PHOSPHORUS: Phosphorus: 1.8 mg/dL — ABNORMAL LOW (ref 2.5–4.6)

## 2021-08-16 LAB — COPPER, SERUM: Copper: 80 ug/dL (ref 80–158)

## 2021-08-16 MED ORDER — LOPERAMIDE HCL 2 MG PO CAPS
2.0000 mg | ORAL_CAPSULE | Freq: Four times a day (QID) | ORAL | Status: DC
  Administered 2021-08-16 – 2021-08-20 (×18): 2 mg via ORAL
  Filled 2021-08-16 (×17): qty 1

## 2021-08-16 MED ORDER — SODIUM PHOSPHATES 45 MMOLE/15ML IV SOLN
15.0000 mmol | Freq: Once | INTRAVENOUS | Status: AC
  Administered 2021-08-16: 15 mmol via INTRAVENOUS
  Filled 2021-08-16: qty 5

## 2021-08-16 MED ORDER — MAGNESIUM SULFATE 4 GM/100ML IV SOLN
4.0000 g | Freq: Once | INTRAVENOUS | Status: AC
  Administered 2021-08-16: 4 g via INTRAVENOUS
  Filled 2021-08-16: qty 100

## 2021-08-16 NOTE — Progress Notes (Signed)
PROGRESS NOTE    Cynthia [name]  WNU:[id number] DOB: 15-Oct-1961 DOA: 08/12/2021 PCP: Denita Lung, MD    Brief Narrative:  60 year old with anxiety, depression, Crohn's disease with fistula status post ileostomy and high output, hypertension, GERD, restless leg syndrome presented to ER with generalized fatigue, weakness, excessive output and nausea vomiting.  Found to have acute kidney injury, hyponatremia and multiple electrolyte abnormalities.   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 Baseline creatinine of 1.2 as per records.  Presented with creatinine 3.1. -Continue isotonic fluid 100 mL/h today.  Gradually improving.  Sodium has is stabilized.   -Replace potassium aggressively and recheck levels. -Replace magnesium aggressively and recheck levels.  Magnesium rider 4 g ordered today. -Hypophosphatemia: Replace aggressively.  Recheck level tomorrow morning. -Patient does have very high output ileostomy causing recurrent hospitalizations and dehydration.  No adequate response yet.  On Imodium, increased dose today.  He started on cholestyramine.  Followed by GI.    Cellulitis surrounding osteoma: Suspected.  On Rocephin.  Without any obvious evidence of infection.  On oral antibiotics.  Looks stable.  Crohn's disease: Status post subtotal colectomy and ileostomy.  GI following.  Anxiety and depression: Continue venlafaxine from home.   DVT prophylaxis: enoxaparin (LOVENOX) injection 30 mg Start: 08/12/21 2000   Code Status: Full code Family  Communication: None Disposition Plan: Status is: Inpatient  Remains inpatient appropriate because:IV treatments appropriate due to intensity of illness or inability to take PO and Inpatient level of care appropriate due to severity of illness  Dispo: The patient is from: Home              Anticipated d/c is to: Home              Patient currently is not medically stable to d/c.   Difficult to place patient No         Consultants:  Gastroenterology  Procedures:  None  Antimicrobials:  Rocephin 10/11--- 10/13.   Subjective: Patient seen and examined.  Denies any nausea vomiting.  She was about to have her colostomy bag changed.  3 L ileostomy output in 24 hours.  Objective: Vitals:   08/15/21 2050 08/16/21 0449 08/16/21 0521 08/16/21 1049  BP: (!) 84/60 (!) 83/57  (!) 82/57  Pulse: 75 75  75  Resp: 16 18  19   Temp: 97.6 F (36.4 C) 98.9 F (37.2 C)  98.6 F (37 C)  TempSrc: Oral Oral  Oral  SpO2: 100% 100%  100%  Weight:   47.4 kg   Height:        Intake/Output Summary (Last 24 hours) at 08/16/2021 1248 Last data filed at 08/16/2021 1159 Gross per 24 hour  Intake 1011 ml  Output 3210 ml  Net -2199 ml   Filed Weights   08/14/21 0652 08/15/21 0444 08/16/21 0521  Weight: 44.9 kg 46.2 kg 47.4 kg    Examination:  General exam: Appears calm and comfortable  Frail and debilitated.  Chronically sick looking but not in any distress.  Cachectic. Respiratory system: Clear to auscultation. Respiratory effort normal. Cardiovascular system: S1 & S2  heard, RRR. No JVD, murmurs, rubs, gallops or clicks. No pedal edema. Gastrointestinal system: Soft.  Nontender.  Large amount of liquidy stool on ileostomy. Central nervous system: Alert and oriented. No focal neurological deficits. Extremities: Symmetric 5 x 5 power. Skin: No rashes, lesions or ulcers Psychiatry: Judgement and insight appear normal. Mood & affect appropriate.     Data Reviewed: I have personally  reviewed following labs and imaging studies  CBC: Recent Labs  Lab 08/12/21 1510 08/12/21 1544 08/13/21 0408 08/14/21 0316 08/15/21 0123 08/16/21 0406  WBC 13.7*  --  10.8* 8.1 7.0 9.5  NEUTROABS 11.6*  --   --  5.3 4.1 6.5  HGB 13.3 13.9 11.1* 9.3* 8.7* 8.4*  HCT 37.0 41.0 32.0* 27.1* 25.6* 25.8*  MCV 82.6  --  85.6 85.5 88.9 91.5  PLT 350  --  254 231 197 017   Basic Metabolic Panel: Recent Labs  Lab 08/12/21 2038 08/13/21 0001 08/13/21 1311 08/13/21 1534 08/14/21 0316 08/15/21 0123 08/16/21 0406  NA 123*   < > 126* 126* 123* 132* 132*  K 3.2*   < > 3.5 3.4* 3.3* 4.7 4.6  CL 91*   < > 98 97* 98 108 110  CO2 18*   < > 18* 19* 17* 19* 17*  GLUCOSE 95   < > 80 103* 94 83 58*  BUN 97*   < > 72* 65* 54* 35* 25*  CREATININE 2.59*   < > 2.14* 2.10* 1.74* 1.48* 1.41*  CALCIUM 8.8*   < > 8.4* 8.3* 8.1* 8.3* 8.3*  MG 1.9  --   --   --  1.5* 2.0 1.4*  PHOS  --   --   --   --   --  1.5* 1.8*   < > = values in this interval not displayed.   GFR: Estimated Creatinine Clearance: 31.4 mL/min (A) (by C-G formula based on SCr of 1.41 mg/dL (H)). Liver Function Tests: Recent Labs  Lab 08/12/21 1510  AST 21  ALT 14  ALKPHOS 58  BILITOT 0.5  PROT 7.4  ALBUMIN 4.0   No results for input(s): LIPASE, AMYLASE in the last 168 hours. No results for input(s): AMMONIA in the last 168 hours. Coagulation Profile: Recent Labs  Lab 08/12/21 1510  INR 1.1   Cardiac Enzymes: No results for input(s): CKTOTAL, CKMB, CKMBINDEX, TROPONINI in the last 168 hours. BNP (last 3 results) No results for input(s): PROBNP in the last 8760 hours. HbA1C: No results for input(s): HGBA1C in the last 72 hours. CBG: Recent Labs  Lab 08/13/21 0908 08/13/21 1109  GLUCAP 47* 99   Lipid Profile: No results for input(s): CHOL, HDL, LDLCALC, TRIG, CHOLHDL, LDLDIRECT in the last 72 hours. Thyroid Function Tests: No results for input(s): TSH, T4TOTAL, FREET4, T3FREE, THYROIDAB in the last 72  hours. Anemia Panel: Recent Labs    08/15/21 0123  VITAMINB12 182   Sepsis Labs: Recent Labs  Lab 08/12/21 1510 08/12/21 1906  LATICACIDVEN 2.9* 3.0*    Recent Results (from the past 240 hour(s))  Culture, blood (Routine x 2)     Status: None (Preliminary result)   Collection Time: 08/12/21  3:30 PM   Specimen: BLOOD LEFT HAND  Result Value Ref Range Status   Specimen Description BLOOD LEFT HAND  Final   Special Requests   Final    BOTTLES DRAWN AEROBIC AND ANAEROBIC Blood Culture results may not be optimal due to an inadequate volume of blood received in culture bottles  Culture   Final    NO GROWTH 4 DAYS Performed at [facility], [place] [address] [place], [place] [address]    Report Status PENDING  Incomplete  Culture, blood (Routine x 2)     Status: None (Preliminary result)   Collection Time: 08/12/21  3:43 PM   Specimen: BLOOD  Result Value Ref Range Status   Specimen Description BLOOD RIGHT ANTECUBITAL  Final   Special Requests   Final    BOTTLES DRAWN AEROBIC AND ANAEROBIC Blood Culture results may not be optimal due to an inadequate volume of blood received in culture bottles   Culture   Final    NO GROWTH 4 DAYS Performed at [facility], [place] [address] [place], [place] [address]    Report Status PENDING  Incomplete  Resp Panel by RT-PCR (Flu A&B, Covid) Nasopharyngeal Swab     Status: None   Collection Time: 08/12/21  5:16 PM   Specimen: Nasopharyngeal Swab; Nasopharyngeal(NP) swabs in vial transport medium  Result Value Ref Range Status   SARS Coronavirus 2 by RT PCR NEGATIVE NEGATIVE Final    Comment: (NOTE) SARS-CoV-2 target nucleic acids are NOT DETECTED.  The SARS-CoV-2 RNA is generally detectable in upper respiratory specimens during the acute phase of infection. The lowest concentration of SARS-CoV-2 viral copies this assay can detect is 138 copies/mL. A negative result does not preclude SARS-Cov-2 infection and should not be used as  the sole basis for treatment or other patient management decisions. A negative result may occur with  improper specimen collection/handling, submission of specimen other than nasopharyngeal swab, presence of viral mutation(s) within the areas targeted by this assay, and inadequate number of viral copies(<138 copies/mL). A negative result must be combined with clinical observations, patient history, and epidemiological information. The expected result is Negative.  Fact Sheet for Patients:  EntrepreneurPulse.com.au  Fact Sheet for Healthcare Providers:  IncredibleEmployment.be  This test is no t yet approved or cleared by the [place] FDA and  has been authorized for detection and/or diagnosis of SARS-CoV-2 by FDA under an Emergency Use Authorization (EUA). This EUA will remain  in effect (meaning this test can be used) for the duration of the COVID-19 declaration under Section 564(b)(1) of the Act, 21 U.S.C.section 360bbb-3(b)(1), unless the authorization is terminated  or revoked sooner.       Influenza A by PCR NEGATIVE NEGATIVE Final   Influenza B by PCR NEGATIVE NEGATIVE Final    Comment: (NOTE) The Xpert Xpress SARS-CoV-2/FLU/RSV plus assay is intended as an aid in the diagnosis of influenza from Nasopharyngeal swab specimens and should not be used as a sole basis for treatment. Nasal washings and aspirates are unacceptable for Xpert Xpress SARS-CoV-2/FLU/RSV testing.  Fact Sheet for Patients: EntrepreneurPulse.com.au  Fact Sheet for Healthcare Providers: IncredibleEmployment.be  This test is not yet approved or cleared by the [place] FDA and has been authorized for detection and/or diagnosis of SARS-CoV-2 by FDA under an Emergency Use Authorization (EUA). This EUA will remain in effect (meaning this test can be used) for the duration of the COVID-19 declaration under Section 564(b)(1) of  the Act, 21 U.S.C. section 360bbb-3(b)(1), unless the authorization is terminated or revoked.  Performed at [facility], [place] [address], [place], [place] [address]          Radiology Studies: No results found.      Scheduled Meds:  cholestyramine  4 g Oral BID   enoxaparin (LOVENOX) injection  30 mg Subcutaneous Q24H   feeding supplement  237 mL Oral TID BM   loperamide  2 mg Oral QID   midodrine  5 mg Oral TID WC   multivitamin with minerals  1 tablet Oral Daily   pantoprazole  40 mg Oral Daily   pramipexole  1 mg Oral Daily   psyllium  1 packet Oral BID   sodium chloride flush  3 mL Intravenous Q12H   venlafaxine XR  300 mg Oral Q breakfast   Continuous Infusions:  sodium chloride 100 mL/hr at 08/16/21 0527   sodium phosphate  Dextrose 5% IVPB       LOS: 3 days    Time spent: 32 minutes    Barb Merino, MD [facility] Pager [phone]

## 2021-08-16 NOTE — Consult Note (Signed)
[place] Nurse ostomy follow up Patient receiving care in [facility] 3E10 Stoma type/location: RLQ ileostomy Stomal assessment/size: 1 1/8" round pink budded Peristomal assessment: irritation is improving. The worst is just around the stoma. She has a high output and the pouch has being changed everyday since admission.  Treatment options for stomal/peristomal skin:  Crusting and barrier ring Output: thin brown Ostomy pouching: 1pc. Convex pouch placed today. [name] # [phone]) with barrier ring [name] # [phone]) after crusting with stoma powder and 32M skin barrier wipes.   Cathlean Marseilles Tamala Julian, MSN, RN, [place], Lysle Pearl, [facility] Wound Treatment Associate Pager [phone]

## 2021-08-16 NOTE — Progress Notes (Signed)
    Progress Note   Subjective  Chief Complaint: Increased ostomy output  Today, the patient tells me that she feels well.  Continues to have watery output from her ostomy.  It is no longer leaking.  Denies any new complaints or concerns.   Objective   Vital signs in last 24 hours: Temp:  [97.6 F (36.4 C)-98.9 F (37.2 C)] 98.9 F (37.2 C) (10/14 0449) Pulse Rate:  [72-75] 75 (10/14 0449) Resp:  [16-19] 18 (10/14 0449) BP: (83-108)/(57-73) 83/57 (10/14 0449) SpO2:  [100 %] 100 % (10/14 0449) Weight:  [47.4 kg] 47.4 kg (10/14 0521) Last BM Date: 08/15/21 General:    white female in NAD Heart:  Regular rate and rhythm; no murmurs Lungs: Respirations even and unlabored, lungs CTA bilaterally Abdomen:  Soft, nontender and nondistended. Normal bowel sounds.+ Ostomy with watery/liquid stool Psych:  Cooperative. Normal mood and affect.  Intake/Output from previous day: 10/13 0701 - 10/14 0700 In: 1007 [P.O.:1007] Out: 3160 [Urine:125; Stool:3035] Intake/Output this shift: Total I/O In: 358 [P.O.:358] Out: -   Lab Results: Recent Labs    08/14/21 0316 08/15/21 0123 08/16/21 0406  WBC 8.1 7.0 9.5  HGB 9.3* 8.7* 8.4*  HCT 27.1* 25.6* 25.8*  PLT 231 197 187   BMET Recent Labs    08/14/21 0316 08/15/21 0123 08/16/21 0406  NA 123* 132* 132*  K 3.3* 4.7 4.6  CL 98 108 110  CO2 17* 19* 17*  GLUCOSE 94 83 58*  BUN 54* 35* 25*  CREATININE 1.74* 1.48* 1.41*  CALCIUM 8.1* 8.3* 8.3*     Assessment / Plan:   Assessment: 1.  Crohn's Disease: Status post subtotal colectomy and ileostomy in April 2022, now with recurrent episodes of dehydration from nausea/vomiting/diarrhea leading to admission for AKI and electrolyte abnormalities, minimal improvement since her stay on addition of fiber, Cholestyramine and Imodium 2.  Cellulitis: Improved  Plan: 1.  Continue fiber supplementation, oral rehydration and Cholestyramine twice daily.  Increased Imodium to 4 times daily  today. 2.  If output does not continue to decrease from here may need to consider Octreotide 3.  Continue other supportive measures  Thank you for your kind consultation, we will continue to follow.    LOS: 3 days   Levin Erp  08/16/2021, 10:04 AM

## 2021-08-16 NOTE — TOC Progression Note (Signed)
Transition of Care [facility]) - Progression Note    Patient Details  Name: Cynthia [name] MRN: [id number] Date of Birth: 08-02-1961  [facility] CM/SW Contact  Zenon Mayo, RN Phone Number: 08/16/2021, 8:52 PM  Clinical Narrative:    from home, AKI, electrolyte abnormalities, and confusion due to high output from ostomy. Pt eval rec HHPT. Consult for med ast , she has Medicaid. TOC will continue to follow for dc needs.        Expected Discharge Plan and Services                                                 Social Determinants of Health (SDOH) Interventions    Readmission Risk Interventions Readmission Risk Prevention Plan 04/09/2021  Transportation Screening Complete  Medication Review Press photographer) Complete  PCP or Specialist appointment within 3-5 days of discharge Complete  HRI or [place] Complete  SW Recovery Care/Counseling Consult Complete  [place] Not Applicable  Some recent data might be hidden

## 2021-08-17 ENCOUNTER — Inpatient Hospital Stay (HOSPITAL_COMMUNITY): Payer: Medicaid Other

## 2021-08-17 DIAGNOSIS — K50013 Crohn's disease of small intestine with fistula: Secondary | ICD-10-CM | POA: Diagnosis not present

## 2021-08-17 DIAGNOSIS — N179 Acute kidney failure, unspecified: Secondary | ICD-10-CM | POA: Diagnosis not present

## 2021-08-17 DIAGNOSIS — E876 Hypokalemia: Secondary | ICD-10-CM | POA: Diagnosis not present

## 2021-08-17 DIAGNOSIS — F418 Other specified anxiety disorders: Secondary | ICD-10-CM | POA: Diagnosis not present

## 2021-08-17 LAB — BASIC METABOLIC PANEL
Anion gap: 5 (ref 5–15)
BUN: 24 mg/dL — ABNORMAL HIGH (ref 8–23)
CO2: 15 mmol/L — ABNORMAL LOW (ref 22–32)
Calcium: 8 mg/dL — ABNORMAL LOW (ref 8.9–10.3)
Chloride: 111 mmol/L (ref 98–111)
Creatinine, Ser: 1.26 mg/dL — ABNORMAL HIGH (ref 0.44–1.00)
GFR, Estimated: 49 mL/min — ABNORMAL LOW (ref >60)
Glucose, Bld: 59 mg/dL — ABNORMAL LOW (ref 70–99)
Potassium: 5.1 mmol/L (ref 3.5–5.1)
Sodium: 131 mmol/L — ABNORMAL LOW (ref 135–145)

## 2021-08-17 LAB — CBC WITH DIFFERENTIAL/PLATELET
Abs Immature Granulocytes: 0.03 10*3/uL (ref 0.00–0.07)
Basophils Absolute: 0 10*3/uL (ref 0.0–0.1)
Basophils Relative: 0 %
Eosinophils Absolute: 0.3 10*3/uL (ref 0.0–0.5)
Eosinophils Relative: 4 %
HCT: 24.5 % — ABNORMAL LOW (ref 36.0–46.0)
Hemoglobin: 7.9 g/dL — ABNORMAL LOW (ref 12.0–15.0)
Immature Granulocytes: 0 %
Lymphocytes Relative: 24 %
Lymphs Abs: 2.1 10*3/uL (ref 0.7–4.0)
MCH: 29.9 pg (ref 26.0–34.0)
MCHC: 32.2 g/dL (ref 30.0–36.0)
MCV: 92.8 fL (ref 80.0–100.0)
Monocytes Absolute: 0.4 10*3/uL (ref 0.1–1.0)
Monocytes Relative: 5 %
Neutro Abs: 5.6 10*3/uL (ref 1.7–7.7)
Neutrophils Relative %: 67 %
Platelets: 132 10*3/uL — ABNORMAL LOW (ref 150–400)
RBC: 2.64 MIL/uL — ABNORMAL LOW (ref 3.87–5.11)
RDW: 13.9 % (ref 11.5–15.5)
WBC: 8.5 10*3/uL (ref 4.0–10.5)
nRBC: 0 % (ref 0.0–0.2)

## 2021-08-17 LAB — CULTURE, BLOOD (ROUTINE X 2)
Culture: NO GROWTH
Culture: NO GROWTH

## 2021-08-17 LAB — PHOSPHORUS: Phosphorus: 1.8 mg/dL — ABNORMAL LOW (ref 2.5–4.6)

## 2021-08-17 LAB — VITAMIN B1: Vitamin B1 (Thiamine): 86.1 nmol/L (ref 66.5–200.0)

## 2021-08-17 LAB — MAGNESIUM: Magnesium: 2 mg/dL (ref 1.7–2.4)

## 2021-08-17 LAB — C DIFFICILE (CDIFF) QUICK SCRN (NO PCR REFLEX)
C Diff antigen: NEGATIVE
C Diff interpretation: NOT DETECTED
C Diff toxin: NEGATIVE

## 2021-08-17 MED ORDER — ZOLPIDEM TARTRATE 5 MG PO TABS
5.0000 mg | ORAL_TABLET | Freq: Every evening | ORAL | Status: DC | PRN
  Administered 2021-08-18: 5 mg via ORAL
  Filled 2021-08-17: qty 1

## 2021-08-17 MED ORDER — SODIUM PHOSPHATES 45 MMOLE/15ML IV SOLN
15.0000 mmol | Freq: Once | INTRAVENOUS | Status: AC
  Administered 2021-08-17: 15 mmol via INTRAVENOUS
  Filled 2021-08-17: qty 5

## 2021-08-17 MED ORDER — IOHEXOL 300 MG/ML  SOLN
80.0000 mL | Freq: Once | INTRAMUSCULAR | Status: AC | PRN
  Administered 2021-08-17: 80 mL via INTRAVENOUS

## 2021-08-17 MED ORDER — BARIUM SULFATE 0.1 % PO SUSP
ORAL | Status: AC
  Administered 2021-08-17: 1 mL
  Filled 2021-08-17: qty 3

## 2021-08-17 MED ORDER — ENOXAPARIN SODIUM 40 MG/0.4ML IJ SOSY
40.0000 mg | PREFILLED_SYRINGE | INTRAMUSCULAR | Status: DC
  Administered 2021-08-17 – 2021-08-19 (×3): 40 mg via SUBCUTANEOUS
  Filled 2021-08-17 (×3): qty 0.4

## 2021-08-17 NOTE — Progress Notes (Signed)
Pt transported off unit to CT. Delia Heady RN

## 2021-08-17 NOTE — Progress Notes (Signed)
    Progress Note   Subjective  Chief Complaint: Increased ostomy output  Feels like her ostomy output is decreasing. Has been eating well.   Objective   Vital signs in last 24 hours: Temp:  [98.2 F (36.8 C)-98.5 F (36.9 C)] 98.2 F (36.8 C) (10/15 0759) Pulse Rate:  [79-83] 83 (10/15 0759) Resp:  [19] 19 (10/15 0759) BP: (91-104)/(58-74) 92/58 (10/15 0759) SpO2:  [100 %] 100 % (10/15 0759) Weight:  [47.5 kg] 47.5 kg (10/15 0500) Last BM Date: 08/17/21 General:    white female in NAD Heart:  Regular rate and rhythm; no murmurs Lungs: Respirations even and unlabored, lungs CTA bilaterally Abdomen:  Soft, nontender and nondistended. Normal bowel sounds.+ Ostomy with watery/liquid stool Psych:  Cooperative. Normal mood and affect.  Intake/Output from previous day: 10/14 0701 - 10/15 0700 In: 3539.1 [P.O.:2091; I.V.:1095.9; IV Piggyback:352.2] Out: 2515 [Stool:2515] Intake/Output this shift: Total I/O In: 2483.2 [P.O.:720; I.V.:1623.8; IV Piggyback:139.4] Out: 320 [Stool:320]  Lab Results: Recent Labs    08/15/21 0123 08/16/21 0406 08/17/21 0329  WBC 7.0 9.5 8.5  HGB 8.7* 8.4* 7.9*  HCT 25.6* 25.8* 24.5*  PLT 197 187 132*   BMET Recent Labs    08/15/21 0123 08/16/21 0406 08/17/21 0329  NA 132* 132* 131*  K 4.7 4.6 5.1  CL 108 110 111  CO2 19* 17* 15*  GLUCOSE 83 58* 59*  BUN 35* 25* 24*  CREATININE 1.48* 1.41* 1.26*  CALCIUM 8.3* 8.3* 8.0*     Assessment / Plan:   Assessment: 1.  Crohn's Disease: Status post subtotal colectomy and ileostomy in April 2022, now with recurrent episodes of dehydration from nausea/vomiting/diarrhea leading to admission for AKI and electrolyte abnormalities. Ostomy output appears to improving with the addition of Imodium, fiber, ORS, and cholestyramine. Still has high output from her ileostomy, but her PO intake almost equals the amount of ileostomy output. Will plan for some additional work up to make sure there is no an  alternative reason for her increased output.  2.  Cellulitis: Improved  Plan: 1.  Continue Imodium, fiber supplementation, oral rehydration and Cholestyramine twice daily. Could potentially add on octreotide, trial rifaximin, or uptitrate Imodium in the future 2.  Check C dif and GI pathogen panel 3. Obtain CT enterography to assess for any active Crohn's disease 4  Continue other supportive measures  Thank you for your kind consultation, we will continue to follow.    LOS: 4 days   Sharyn Creamer  08/17/2021, 11:39 AM

## 2021-08-17 NOTE — Progress Notes (Signed)
PROGRESS NOTE    Cynthia [name]  WUJ:[id number] DOB: 1961/09/07 DOA: 08/12/2021 PCP: Denita Lung, MD    Brief Narrative:  60 year old with anxiety, depression, Crohn's disease with fistula status post ileostomy and high output, hypertension, GERD, restless leg syndrome presented to ER with generalized fatigue, weakness, excessive output and nausea vomiting.  Found to have acute kidney injury, hyponatremia and multiple electrolyte abnormalities.   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 Treated with aggressive IV rehydration.  Her renal functions are normalizing.  Continue maintenance IV fluids today.  Sodium has stabilized. -Potassium has stabilized. -Magnesium was aggressively replaced and normalized. -Phosphorus, replace further today. High output ileostomy, still with significant output noted 2500 mL overnight.  Is started on Imodium 4 times a day, added cholestyramine.  May benefit with octreotide.  Followed by GI.  Cellulitis surrounding osteoma: Suspected.  Treated with Rocephin.  No evidence of infection.  Discontinued.  Crohn's disease: Status post subtotal colectomy and ileostomy.  GI following.  Anxiety and depression: Continue venlafaxine from home.   DVT prophylaxis: enoxaparin (LOVENOX) injection 30 mg Start: 08/12/21 2000   Code Status: Full code Family Communication: None Disposition Plan: Status is: Inpatient  Remains inpatient appropriate because:IV treatments appropriate due to intensity of illness or inability to take PO and  Inpatient level of care appropriate due to severity of illness  Dispo: The patient is from: Home              Anticipated d/c is to: Home              Patient currently is not medically stable to d/c.   Difficult to place patient No         Consultants:  Gastroenterology  Procedures:  None  Antimicrobials:  Rocephin 10/11--- 10/13.   Subjective: Patient seen and examined.  No complaints.  She wants something to help her sleep at night. Ileostomy drainage is about the same.  Recorded 2500 mL last 24 hours.  Objective: Vitals:   08/16/21 1724 08/16/21 2000 08/17/21 0500 08/17/21 0759  BP: 104/74 101/69 91/64 (!) 92/58  Pulse: 80 79  83  Resp:    19  Temp:  98.5 F (36.9 C) 98.4 F (36.9 C) 98.2 F (36.8 C)  TempSrc:  Oral Oral Oral  SpO2:  100% 100% 100%  Weight:   47.5 kg   Height:        Intake/Output Summary (Last 24 hours) at 08/17/2021 1149 Last data filed at 08/17/2021 1129 Gross per 24 hour  Intake 5184.33 ml  Output 2835 ml  Net 2349.33 ml   Filed Weights   08/15/21 0444 08/16/21 0521 08/17/21 0500  Weight: 46.2 kg 47.4 kg 47.5 kg    Examination:  General exam: Appears calm and comfortable  Frail and debilitated.  Chronically sick looking but not in any distress.   Respiratory system: Clear to auscultation. Respiratory effort normal. Cardiovascular system: S1 & S2 heard, RRR. No JVD, murmurs, rubs, gallops or clicks. No pedal edema. Gastrointestinal system: Soft.  Nontender.  Large amount of liquidy stool on ileostomy Today with some thick  plaques present on the ileostomy.    Data Reviewed: I have personally reviewed following labs and imaging studies  CBC: Recent Labs  Lab 08/12/21 1510 08/12/21 1544 08/13/21 0408 08/14/21 0316 08/15/21 0123 08/16/21 0406 08/17/21 0329  WBC 13.7*  --  10.8* 8.1 7.0 9.5 8.5  NEUTROABS 11.6*  --   --  5.3 4.1 6.5 5.6  HGB 13.3   < > 11.1* 9.3* 8.7* 8.4* 7.9*  HCT 37.0   < > 32.0* 27.1* 25.6* 25.8*  24.5*  MCV 82.6  --  85.6 85.5 88.9 91.5 92.8  PLT 350  --  254 231 197 187 132*   < > = values in this interval not displayed.   Basic Metabolic Panel: Recent Labs  Lab 08/12/21 2038 08/13/21 0001 08/13/21 1534 08/14/21 0316 08/15/21 0123 08/16/21 0406 08/17/21 0329  NA 123*   < > 126* 123* 132* 132* 131*  K 3.2*   < > 3.4* 3.3* 4.7 4.6 5.1  CL 91*   < > 97* 98 108 110 111  CO2 18*   < > 19* 17* 19* 17* 15*  GLUCOSE 95   < > 103* 94 83 58* 59*  BUN 97*   < > 65* 54* 35* 25* 24*  CREATININE 2.59*   < > 2.10* 1.74* 1.48* 1.41* 1.26*  CALCIUM 8.8*   < > 8.3* 8.1* 8.3* 8.3* 8.0*  MG 1.9  --   --  1.5* 2.0 1.4* 2.0  PHOS  --   --   --   --  1.5* 1.8* 1.8*   < > = values in this interval not displayed.   GFR: Estimated Creatinine Clearance: 35.2 mL/min (A) (by C-G formula based on SCr of 1.26 mg/dL (H)). Liver Function Tests: Recent Labs  Lab 08/12/21 1510  AST 21  ALT 14  ALKPHOS 58  BILITOT 0.5  PROT 7.4  ALBUMIN 4.0   No results for input(s): LIPASE, AMYLASE in the last 168 hours. No results for input(s): AMMONIA in the last 168 hours. Coagulation Profile: Recent Labs  Lab 08/12/21 1510  INR 1.1   Cardiac Enzymes: No results for input(s): CKTOTAL, CKMB, CKMBINDEX, TROPONINI in the last 168 hours. BNP (last 3 results) No results for input(s): PROBNP in the last 8760 hours. HbA1C: No results for input(s): HGBA1C in the last 72 hours. CBG: Recent Labs  Lab 08/13/21 0908 08/13/21 1109  GLUCAP 47* 99   Lipid Profile: No results for input(s): CHOL, HDL, LDLCALC, TRIG, CHOLHDL, LDLDIRECT in the last 72 hours. Thyroid Function Tests: No results for input(s): TSH, T4TOTAL, FREET4, T3FREE, THYROIDAB in the last 72 hours. Anemia Panel: Recent Labs    08/15/21 0123  VITAMINB12 182   Sepsis Labs: Recent Labs  Lab 08/12/21 1510 08/12/21 1906  LATICACIDVEN 2.9* 3.0*    Recent Results (from the past 240 hour(s))  Culture, blood (Routine x 2)     Status:  None   Collection Time: 08/12/21  3:30 PM   Specimen: BLOOD LEFT HAND  Result Value Ref Range Status   Specimen Description BLOOD LEFT HAND  Final   Special Requests   Final    BOTTLES DRAWN AEROBIC AND ANAEROBIC Blood Culture results may not be optimal due to an inadequate volume of blood received in culture bottles   Culture   Final    NO GROWTH 5 DAYS Performed at [facility], [place] [address], [place], [place] [address]    Report Status 08/17/2021 FINAL  Final  Culture, blood (Routine x 2)     Status: None   Collection Time: 08/12/21  3:43 PM   Specimen: BLOOD  Result Value Ref Range Status   Specimen Description BLOOD RIGHT ANTECUBITAL  Final   Special Requests   Final    BOTTLES DRAWN AEROBIC AND ANAEROBIC Blood Culture results may not be optimal due to an inadequate volume of blood received in culture bottles   Culture   Final    NO GROWTH 5 DAYS Performed at [facility], [place] [address], [place], [place] [address]    Report Status 08/17/2021 FINAL  Final  Resp Panel by RT-PCR (Flu A&B, Covid) Nasopharyngeal Swab     Status: None   Collection Time: 08/12/21  5:16 PM   Specimen: Nasopharyngeal Swab; Nasopharyngeal(NP) swabs in vial transport medium  Result Value Ref Range Status   SARS Coronavirus 2 by RT PCR NEGATIVE NEGATIVE Final    Comment: (NOTE) SARS-CoV-2 target nucleic acids are NOT DETECTED.  The SARS-CoV-2 RNA is generally detectable in upper respiratory specimens during the acute phase of infection. The lowest concentration of SARS-CoV-2 viral copies this assay can detect is 138 copies/mL. A negative result does not preclude SARS-Cov-2 infection and should not be used as the sole basis for treatment or other patient management decisions. A negative result may occur with  improper specimen collection/handling, submission of specimen other than nasopharyngeal swab, presence of viral mutation(s) within the areas targeted by this assay, and inadequate  number of viral copies(<138 copies/mL). A negative result must be combined with clinical observations, patient history, and epidemiological information. The expected result is Negative.  Fact Sheet for Patients:  EntrepreneurPulse.com.au  Fact Sheet for Healthcare Providers:  IncredibleEmployment.be  This test is no t yet approved or cleared by the [place] FDA and  has been authorized for detection and/or diagnosis of SARS-CoV-2 by FDA under an Emergency Use Authorization (EUA). This EUA will remain  in effect (meaning this test can be used) for the duration of the COVID-19 declaration under Section 564(b)(1) of the Act, 21 U.S.C.section 360bbb-3(b)(1), unless the authorization is terminated  or revoked sooner.       Influenza A by PCR NEGATIVE NEGATIVE Final   Influenza B by PCR NEGATIVE NEGATIVE Final    Comment: (NOTE) The Xpert Xpress SARS-CoV-2/FLU/RSV plus assay is intended as an aid in the diagnosis of influenza from Nasopharyngeal swab specimens and should not be used as a sole basis for treatment. Nasal washings and aspirates are unacceptable for Xpert Xpress SARS-CoV-2/FLU/RSV testing.  Fact Sheet for Patients: EntrepreneurPulse.com.au  Fact Sheet for Healthcare Providers: IncredibleEmployment.be  This test is not yet approved or cleared by the [place] FDA and has been authorized for detection and/or diagnosis of SARS-CoV-2 by FDA under an Emergency Use Authorization (EUA). This EUA will remain in effect (meaning this test can be used) for the duration of the COVID-19 declaration under Section 564(b)(1) of the Act, 21 U.S.C. section 360bbb-3(b)(1), unless the authorization is terminated or revoked.  Performed at [facility], [place] [address] [place], [place] [address]          Radiology Studies: No results found.      Scheduled Meds:  cholestyramine  4 g Oral  BID   enoxaparin (LOVENOX) injection  30 mg Subcutaneous Q24H   feeding supplement  237 mL Oral TID BM   loperamide  2 mg Oral QID   midodrine  5 mg Oral TID WC   multivitamin  with minerals  1 tablet Oral Daily   pantoprazole  40 mg Oral Daily   pramipexole  1 mg Oral Daily   psyllium  1 packet Oral BID   sodium chloride flush  3 mL Intravenous Q12H   venlafaxine XR  300 mg Oral Q breakfast   Continuous Infusions:  sodium chloride 100 mL/hr at 08/17/21 1129   sodium phosphate  Dextrose 5% IVPB 43 mL/hr at 08/17/21 1129     LOS: 4 days    Time spent: 32 minutes    Barb Merino, MD [facility] Pager [phone]

## 2021-08-18 DIAGNOSIS — K50013 Crohn's disease of small intestine with fistula: Secondary | ICD-10-CM | POA: Diagnosis not present

## 2021-08-18 DIAGNOSIS — E876 Hypokalemia: Secondary | ICD-10-CM | POA: Diagnosis not present

## 2021-08-18 DIAGNOSIS — N179 Acute kidney failure, unspecified: Secondary | ICD-10-CM | POA: Diagnosis not present

## 2021-08-18 DIAGNOSIS — F418 Other specified anxiety disorders: Secondary | ICD-10-CM | POA: Diagnosis not present

## 2021-08-18 LAB — CBC WITH DIFFERENTIAL/PLATELET
Abs Immature Granulocytes: 0.04 10*3/uL (ref 0.00–0.07)
Basophils Absolute: 0 10*3/uL (ref 0.0–0.1)
Basophils Relative: 0 %
Eosinophils Absolute: 0.3 10*3/uL (ref 0.0–0.5)
Eosinophils Relative: 3 %
HCT: 23.1 % — ABNORMAL LOW (ref 36.0–46.0)
Hemoglobin: 7.6 g/dL — ABNORMAL LOW (ref 12.0–15.0)
Immature Granulocytes: 1 %
Lymphocytes Relative: 25 %
Lymphs Abs: 2.2 10*3/uL (ref 0.7–4.0)
MCH: 30.6 pg (ref 26.0–34.0)
MCHC: 32.9 g/dL (ref 30.0–36.0)
MCV: 93.1 fL (ref 80.0–100.0)
Monocytes Absolute: 0.6 10*3/uL (ref 0.1–1.0)
Monocytes Relative: 6 %
Neutro Abs: 5.7 10*3/uL (ref 1.7–7.7)
Neutrophils Relative %: 65 %
Platelets: 158 10*3/uL (ref 150–400)
RBC: 2.48 MIL/uL — ABNORMAL LOW (ref 3.87–5.11)
RDW: 14 % (ref 11.5–15.5)
WBC: 8.9 10*3/uL (ref 4.0–10.5)
nRBC: 0 % (ref 0.0–0.2)

## 2021-08-18 LAB — BASIC METABOLIC PANEL
Anion gap: 3 — ABNORMAL LOW (ref 5–15)
BUN: 17 mg/dL (ref 8–23)
CO2: 17 mmol/L — ABNORMAL LOW (ref 22–32)
Calcium: 8 mg/dL — ABNORMAL LOW (ref 8.9–10.3)
Chloride: 113 mmol/L — ABNORMAL HIGH (ref 98–111)
Creatinine, Ser: 1.31 mg/dL — ABNORMAL HIGH (ref 0.44–1.00)
GFR, Estimated: 46 mL/min — ABNORMAL LOW (ref >60)
Glucose, Bld: 71 mg/dL (ref 70–99)
Potassium: 4.5 mmol/L (ref 3.5–5.1)
Sodium: 133 mmol/L — ABNORMAL LOW (ref 135–145)

## 2021-08-18 LAB — PHOSPHORUS: Phosphorus: 2.1 mg/dL — ABNORMAL LOW (ref 2.5–4.6)

## 2021-08-18 LAB — MAGNESIUM: Magnesium: 1.4 mg/dL — ABNORMAL LOW (ref 1.7–2.4)

## 2021-08-18 MED ORDER — SODIUM PHOSPHATES 45 MMOLE/15ML IV SOLN
15.0000 mmol | Freq: Once | INTRAVENOUS | Status: AC
  Administered 2021-08-18: 15 mmol via INTRAVENOUS
  Filled 2021-08-18: qty 5

## 2021-08-18 MED ORDER — MAGNESIUM SULFATE 4 GM/100ML IV SOLN
4.0000 g | Freq: Once | INTRAVENOUS | Status: AC
  Administered 2021-08-18: 4 g via INTRAVENOUS
  Filled 2021-08-18: qty 100

## 2021-08-18 MED ORDER — DIPHENOXYLATE-ATROPINE 2.5-0.025 MG PO TABS
2.0000 | ORAL_TABLET | Freq: Two times a day (BID) | ORAL | Status: DC
  Administered 2021-08-18 – 2021-08-20 (×5): 2 via ORAL
  Filled 2021-08-18 (×5): qty 2

## 2021-08-18 MED ORDER — ACETAMINOPHEN 500 MG PO TABS: 1000.0000 mg | ORAL_TABLET | Freq: Four times a day (QID) | ORAL | Status: DC | PRN

## 2021-08-18 NOTE — Progress Notes (Signed)
PROGRESS NOTE    Cynthia [name]  JGO:[id number] DOB: 20-Aug-1961 DOA: 08/12/2021 PCP: Denita Lung, MD    Brief Narrative:  60 year old with anxiety, depression, Crohn's disease with fistula status post ileostomy and high output, hypertension, GERD, restless leg syndrome presented to ER with generalized fatigue, weakness, excessive output and nausea vomiting.  Found to have acute kidney injury, hyponatremia and multiple electrolyte abnormalities.   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 Treated with aggressive IV rehydration.  Her renal functions are normalizing.  Continue maintenance IV fluids today.  Sodium has stabilized. -Potassium has stabilized. -Magnesium persistently low, replace with 4 gm Mag rider today. -Phosphorus, replace further today. High output ileostomy, still with significant output noted > 3000 mL overnight.  No adequate response to Imodium 4 times a day, cholestyramine 2 times a day.  GI following.  Patient is started on Lomotil 2 times a day today.    Crohn's disease: Status post subtotal colectomy and ileostomy.  GI following. New CT scan with no evidence of recurrent disease.  Anxiety and depression: Continue venlafaxine from home.   DVT prophylaxis: enoxaparin (LOVENOX) injection 40 mg Start: 08/17/21 2000   Code Status: Full code Family Communication: None Disposition Plan: Status is: Inpatient  Remains inpatient appropriate because:IV treatments appropriate due to intensity of illness or inability to take PO and Inpatient  level of care appropriate due to severity of illness  Dispo: The patient is from: Home              Anticipated d/c is to: Home              Patient currently is not medically stable to d/c.   Difficult to place patient No         Consultants:  Gastroenterology  Procedures:  None  Antimicrobials:  Rocephin 10/11--- 10/13.   Subjective: Patient seen and examined.  She has some headache and poor sleep at night. Denies any nausea vomiting or abdominal pain. Colostomy output recorded 3300 mL last 24 hours.  Also received contrast.  Objective: Vitals:   08/17/21 0759 08/17/21 1233 08/17/21 2000 08/18/21 0500  BP: (!) 92/58 92/61 93/66  98/61  Pulse: 83  80 79  Resp: 19 18 19 18   Temp: 98.2 F (36.8 C) 98.3 F (36.8 C) 98 F (36.7 C) 98.4 F (36.9 C)  TempSrc: Oral Oral Oral Oral  SpO2: 100% 99% 99% 100%  Weight:    51.2 kg  Height:        Intake/Output Summary (Last 24 hours) at 08/18/2021 1054 Last data filed at 08/18/2021 0953 Gross per 24 hour  Intake 5247.52 ml  Output 3370 ml  Net 1877.52 ml   Filed Weights   08/16/21 0521 08/17/21 0500 08/18/21 0500  Weight: 47.4 kg 47.5 kg 51.2 kg    Examination:  General exam: Appears calm and comfortable  Frail and debilitated.  Chronically sick looking but not in any distress.   Respiratory system: Clear to auscultation. Respiratory effort normal. Cardiovascular system: S1 & S2 heard, RRR. No JVD, murmurs, rubs, gallops or clicks. No pedal edema. Gastrointestinal system:  Soft.  Nontender.  Large amount of liquidy stool on ileostomy.  Nontender.    Data Reviewed: I have personally reviewed following labs and imaging studies  CBC: Recent Labs  Lab 08/14/21 0316 08/15/21 0123 08/16/21 0406 08/17/21 0329 08/18/21 0327  WBC 8.1 7.0 9.5 8.5 8.9  NEUTROABS 5.3 4.1 6.5 5.6 5.7  HGB 9.3* 8.7* 8.4* 7.9* 7.6*  HCT 27.1* 25.6* 25.8* 24.5* 23.1*  MCV 85.5 88.9 91.5 92.8 93.1  PLT 231 197 187 132* 419   Basic  Metabolic Panel: Recent Labs  Lab 08/14/21 0316 08/15/21 0123 08/16/21 0406 08/17/21 0329 08/18/21 0327  NA 123* 132* 132* 131* 133*  K 3.3* 4.7 4.6 5.1 4.5  CL 98 108 110 111 113*  CO2 17* 19* 17* 15* 17*  GLUCOSE 94 83 58* 59* 71  BUN 54* 35* 25* 24* 17  CREATININE 1.74* 1.48* 1.41* 1.26* 1.31*  CALCIUM 8.1* 8.3* 8.3* 8.0* 8.0*  MG 1.5* 2.0 1.4* 2.0 1.4*  PHOS  --  1.5* 1.8* 1.8* 2.1*   GFR: Estimated Creatinine Clearance: 36.5 mL/min (A) (by C-G formula based on SCr of 1.31 mg/dL (H)). Liver Function Tests: Recent Labs  Lab 08/12/21 1510  AST 21  ALT 14  ALKPHOS 58  BILITOT 0.5  PROT 7.4  ALBUMIN 4.0   No results for input(s): LIPASE, AMYLASE in the last 168 hours. No results for input(s): AMMONIA in the last 168 hours. Coagulation Profile: Recent Labs  Lab 08/12/21 1510  INR 1.1   Cardiac Enzymes: No results for input(s): CKTOTAL, CKMB, CKMBINDEX, TROPONINI in the last 168 hours. BNP (last 3 results) No results for input(s): PROBNP in the last 8760 hours. HbA1C: No results for input(s): HGBA1C in the last 72 hours. CBG: Recent Labs  Lab 08/13/21 0908 08/13/21 1109  GLUCAP 47* 99   Lipid Profile: No results for input(s): CHOL, HDL, LDLCALC, TRIG, CHOLHDL, LDLDIRECT in the last 72 hours. Thyroid Function Tests: No results for input(s): TSH, T4TOTAL, FREET4, T3FREE, THYROIDAB in the last 72 hours. Anemia Panel: No results for input(s): VITAMINB12, FOLATE, FERRITIN, TIBC, IRON, RETICCTPCT in the last 72 hours.  Sepsis Labs: Recent Labs  Lab 08/12/21 1510 08/12/21 1906  LATICACIDVEN 2.9* 3.0*    Recent Results (from the past 240 hour(s))  Culture, blood (Routine x 2)     Status: None   Collection Time: 08/12/21  3:30 PM   Specimen: BLOOD LEFT HAND  Result Value Ref Range Status   Specimen Description BLOOD LEFT HAND  Final   Special Requests   Final    BOTTLES DRAWN AEROBIC AND ANAEROBIC Blood Culture results may not be optimal due to an  inadequate volume of blood received in culture bottles   Culture   Final    NO GROWTH 5 DAYS Performed at [facility], [place] [address], [place], [place] [address]    Report Status 08/17/2021 FINAL  Final  Culture, blood (Routine x 2)     Status: None   Collection Time: 08/12/21  3:43 PM   Specimen: BLOOD  Result Value Ref Range Status   Specimen Description BLOOD RIGHT ANTECUBITAL  Final   Special Requests   Final    BOTTLES DRAWN AEROBIC AND ANAEROBIC Blood Culture results may not be optimal due to an inadequate volume of blood received in culture bottles   Culture   Final    NO GROWTH 5 DAYS Performed at [facility], [place] [address] [place], [place] [address]  Report Status 08/17/2021 FINAL  Final  Resp Panel by RT-PCR (Flu A&B, Covid) Nasopharyngeal Swab     Status: None   Collection Time: 08/12/21  5:16 PM   Specimen: Nasopharyngeal Swab; Nasopharyngeal(NP) swabs in vial transport medium  Result Value Ref Range Status   SARS Coronavirus 2 by RT PCR NEGATIVE NEGATIVE Final    Comment: (NOTE) SARS-CoV-2 target nucleic acids are NOT DETECTED.  The SARS-CoV-2 RNA is generally detectable in upper respiratory specimens during the acute phase of infection. The lowest concentration of SARS-CoV-2 viral copies this assay can detect is 138 copies/mL. A negative result does not preclude SARS-Cov-2 infection and should not be used as the sole basis for treatment or other patient management decisions. A negative result may occur with  improper specimen collection/handling, submission of specimen other than nasopharyngeal swab, presence of viral mutation(s) within the areas targeted by this assay, and inadequate number of viral copies(<138 copies/mL). A negative result must be combined with clinical observations, patient history, and epidemiological information. The expected result is Negative.  Fact Sheet for Patients:  EntrepreneurPulse.com.au  Fact  Sheet for Healthcare Providers:  IncredibleEmployment.be  This test is no t yet approved or cleared by the [place] FDA and  has been authorized for detection and/or diagnosis of SARS-CoV-2 by FDA under an Emergency Use Authorization (EUA). This EUA will remain  in effect (meaning this test can be used) for the duration of the COVID-19 declaration under Section 564(b)(1) of the Act, 21 U.S.C.section 360bbb-3(b)(1), unless the authorization is terminated  or revoked sooner.       Influenza A by PCR NEGATIVE NEGATIVE Final   Influenza B by PCR NEGATIVE NEGATIVE Final    Comment: (NOTE) The Xpert Xpress SARS-CoV-2/FLU/RSV plus assay is intended as an aid in the diagnosis of influenza from Nasopharyngeal swab specimens and should not be used as a sole basis for treatment. Nasal washings and aspirates are unacceptable for Xpert Xpress SARS-CoV-2/FLU/RSV testing.  Fact Sheet for Patients: EntrepreneurPulse.com.au  Fact Sheet for Healthcare Providers: IncredibleEmployment.be  This test is not yet approved or cleared by the [place] FDA and has been authorized for detection and/or diagnosis of SARS-CoV-2 by FDA under an Emergency Use Authorization (EUA). This EUA will remain in effect (meaning this test can be used) for the duration of the COVID-19 declaration under Section 564(b)(1) of the Act, 21 U.S.C. section 360bbb-3(b)(1), unless the authorization is terminated or revoked.  Performed at [facility], [place] [address] [place], [place] [address]   C Difficile Quick Screen (NO PCR Reflex)     Status: None   Collection Time: 08/17/21 11:52 AM   Specimen: STOOL  Result Value Ref Range Status   C Diff antigen NEGATIVE NEGATIVE Final   C Diff toxin NEGATIVE NEGATIVE Final   C Diff interpretation No C. difficile detected.  Final    Comment: Performed at [facility], [place] [address], [place], [place]  [address]         Radiology Studies: CT ENTERO ABD/PELVIS W CONTAST  Result Date: 08/17/2021 CLINICAL DATA:  Inflammatory bowel disease EXAM: CT ABDOMEN AND PELVIS WITH CONTRAST (ENTEROGRAPHY) TECHNIQUE: Multidetector CT of the abdomen and pelvis during bolus administration of intravenous contrast. Negative oral contrast was given. CONTRAST:  80m OMNIPAQUE IOHEXOL 300 MG/ML SOLN, additional negative oral enteric contrast COMPARISON:  08/12/2021 FINDINGS: Lower chest: Trace pleural effusions. Hepatobiliary: No solid liver abnormality is seen. No gallstones, gallbladder wall thickening, or biliary dilatation. Pancreas: Unremarkable. No  pancreatic ductal dilatation or surrounding inflammatory changes. Spleen: Normal in size without significant abnormality. Adrenals/Urinary Tract: Adrenal glands are unremarkable. Kidneys are normal, without renal calculi, solid lesion, or hydronephrosis. Bladder is unremarkable. Stomach/Bowel: Stomach is within normal limits. Status post ileocecal resection with end ileostomy. The colon is decompressed to the rectum. Limited distension of the small bowel; within this limitation, no acute inflammatory findings Vascular/Lymphatic: No significant vascular findings are present. No enlarged abdominal or pelvic lymph nodes. Reproductive: No mass or other significant abnormality. Other: Anasarca.  Small volume fluid in the pelvis. Musculoskeletal: No acute or significant osseous findings. IMPRESSION: 1. Status post ileocecal resection with end ileostomy. 2. Limited distension of the small bowel; within this limitation, no acute inflammatory findings. 3. Trace pleural effusions, small volume ascites and anasarca. Electronically Signed   By: Delanna Ahmadi M.D.   On: 08/17/2021 19:06        Scheduled Meds:  cholestyramine  4 g Oral BID   diphenoxylate-atropine  2 tablet Oral BID   enoxaparin (LOVENOX) injection  40 mg Subcutaneous Q24H   feeding supplement  237 mL Oral TID BM    loperamide  2 mg Oral QID   midodrine  5 mg Oral TID WC   multivitamin with minerals  1 tablet Oral Daily   pantoprazole  40 mg Oral Daily   pramipexole  1 mg Oral Daily   psyllium  1 packet Oral BID   sodium chloride flush  3 mL Intravenous Q12H   venlafaxine XR  300 mg Oral Q breakfast   Continuous Infusions:  sodium chloride Stopped (08/18/21 0529)   sodium phosphate  Dextrose 5% IVPB 43 mL/hr at 08/18/21 0953     LOS: 5 days    Time spent: 32 minutes    Barb Merino, MD [facility] Pager [phone]

## 2021-08-18 NOTE — Progress Notes (Signed)
Progress Note   Subjective  Chief Complaint: Increased ostomy output  Ostomy output did eat increased some over the last day, but patient did have contrast for CT enterography.  She denies any new complaints or concerns.   Objective   Vital signs in last 24 hours: Temp:  [98 F (36.7 C)-98.4 F (36.9 C)] 98.4 F (36.9 C) (10/16 0500) Pulse Rate:  [79-80] 79 (10/16 0500) Resp:  [18-19] 18 (10/16 0500) BP: (92-98)/(61-66) 98/61 (10/16 0500) SpO2:  [99 %-100 %] 100 % (10/16 0500) Weight:  [51.2 kg] 51.2 kg (10/16 0500) Last BM Date: 08/18/21 General:    Chronically ill-appearing white female in NAD Heart:  Regular rate and rhythm; no murmurs Lungs: Respirations even and unlabored, lungs CTA bilaterally Abdomen:  Soft, nontender and nondistended. Normal bowel sounds. Psych:  Cooperative. Normal mood and affect.  Intake/Output from previous day: 10/15 0701 - 10/16 0700 In: 3990 [P.O.:1720; I.V.:2008.4; IV Piggyback:261.6] Out: 3690 [Stool:3690] Intake/Output this shift: Total I/O In: 1617.5 [P.O.:420; I.V.:1065.1; IV Piggyback:132.4] Out: -   Lab Results: Recent Labs    08/16/21 0406 08/17/21 0329 08/18/21 0327  WBC 9.5 8.5 8.9  HGB 8.4* 7.9* 7.6*  HCT 25.8* 24.5* 23.1*  PLT 187 132* 158   BMET Recent Labs    08/16/21 0406 08/17/21 0329 08/18/21 0327  NA 132* 131* 133*  K 4.6 5.1 4.5  CL 110 111 113*  CO2 17* 15* 17*  GLUCOSE 58* 59* 71  BUN 25* 24* 17  CREATININE 1.41* 1.26* 1.31*  CALCIUM 8.3* 8.0* 8.0*   Studies/Results: CT ENTERO ABD/PELVIS W CONTAST  Result Date: 08/17/2021 CLINICAL DATA:  Inflammatory bowel disease EXAM: CT ABDOMEN AND PELVIS WITH CONTRAST (ENTEROGRAPHY) TECHNIQUE: Multidetector CT of the abdomen and pelvis during bolus administration of intravenous contrast. Negative oral contrast was given. CONTRAST:  85m OMNIPAQUE IOHEXOL 300 MG/ML SOLN, additional negative oral enteric contrast COMPARISON:  08/12/2021 FINDINGS: Lower chest:  Trace pleural effusions. Hepatobiliary: No solid liver abnormality is seen. No gallstones, gallbladder wall thickening, or biliary dilatation. Pancreas: Unremarkable. No pancreatic ductal dilatation or surrounding inflammatory changes. Spleen: Normal in size without significant abnormality. Adrenals/Urinary Tract: Adrenal glands are unremarkable. Kidneys are normal, without renal calculi, solid lesion, or hydronephrosis. Bladder is unremarkable. Stomach/Bowel: Stomach is within normal limits. Status post ileocecal resection with end ileostomy. The colon is decompressed to the rectum. Limited distension of the small bowel; within this limitation, no acute inflammatory findings Vascular/Lymphatic: No significant vascular findings are present. No enlarged abdominal or pelvic lymph nodes. Reproductive: No mass or other significant abnormality. Other: Anasarca.  Small volume fluid in the pelvis. Musculoskeletal: No acute or significant osseous findings. IMPRESSION: 1. Status post ileocecal resection with end ileostomy. 2. Limited distension of the small bowel; within this limitation, no acute inflammatory findings. 3. Trace pleural effusions, small volume ascites and anasarca. Electronically Signed   By: ADelanna AhmadiM.D.   On: 08/17/2021 19:06      Assessment / Plan:    Assessment: 1.  Crohn's disease: Status post subtotal colectomy and ileostomy in April 2022, now with recurrent episodes of dehydration from nausea/vomiting/diarrhea leading to admission for AKI and electrolyte abnormalities, ostomy output appears to be improving in general with addition of Imodium, Fiber, oral rehydration and cholestyramine, CT enterography yesterday unrevealing for ongoing inflammation 2.  Cellulitis: Improved  Plan: 1.  Continue current therapies including Imodium, Fiber, Oral hydration rehydration and Cholestyramine twice a day.  We will add Lomotil 2 tabs twice daily. 2.  C. difficile is negative, GI pathogen panel still  pending. 3.  Continue supportive measures  Thank you for kind consultation, we will continue to follow.   LOS: 5 days   Levin Erp  08/18/2021, 10:04 AM

## 2021-08-19 ENCOUNTER — Telehealth: Payer: Self-pay | Admitting: Gastroenterology

## 2021-08-19 DIAGNOSIS — N179 Acute kidney failure, unspecified: Secondary | ICD-10-CM | POA: Diagnosis not present

## 2021-08-19 DIAGNOSIS — E876 Hypokalemia: Secondary | ICD-10-CM | POA: Diagnosis not present

## 2021-08-19 DIAGNOSIS — F418 Other specified anxiety disorders: Secondary | ICD-10-CM | POA: Diagnosis not present

## 2021-08-19 DIAGNOSIS — K50013 Crohn's disease of small intestine with fistula: Secondary | ICD-10-CM | POA: Diagnosis not present

## 2021-08-19 LAB — CBC WITH DIFFERENTIAL/PLATELET
Abs Immature Granulocytes: 0.03 10*3/uL (ref 0.00–0.07)
Basophils Absolute: 0 10*3/uL (ref 0.0–0.1)
Basophils Relative: 0 %
Eosinophils Absolute: 0.3 10*3/uL (ref 0.0–0.5)
Eosinophils Relative: 4 %
HCT: 23.8 % — ABNORMAL LOW (ref 36.0–46.0)
Hemoglobin: 7.4 g/dL — ABNORMAL LOW (ref 12.0–15.0)
Immature Granulocytes: 0 %
Lymphocytes Relative: 32 %
Lymphs Abs: 2.4 10*3/uL (ref 0.7–4.0)
MCH: 29.6 pg (ref 26.0–34.0)
MCHC: 31.1 g/dL (ref 30.0–36.0)
MCV: 95.2 fL (ref 80.0–100.0)
Monocytes Absolute: 0.5 10*3/uL (ref 0.1–1.0)
Monocytes Relative: 6 %
Neutro Abs: 4.5 10*3/uL (ref 1.7–7.7)
Neutrophils Relative %: 58 %
Platelets: 162 10*3/uL (ref 150–400)
RBC: 2.5 MIL/uL — ABNORMAL LOW (ref 3.87–5.11)
RDW: 14.3 % (ref 11.5–15.5)
WBC: 7.7 10*3/uL (ref 4.0–10.5)
nRBC: 0 % (ref 0.0–0.2)

## 2021-08-19 LAB — RETICULOCYTES
Immature Retic Fract: 16.4 % — ABNORMAL HIGH (ref 2.3–15.9)
RBC.: 2.47 MIL/uL — ABNORMAL LOW (ref 3.87–5.11)
Retic Count, Absolute: 40.3 10*3/uL (ref 19.0–186.0)
Retic Ct Pct: 1.6 % (ref 0.4–3.1)

## 2021-08-19 LAB — BASIC METABOLIC PANEL
Anion gap: 3 — ABNORMAL LOW (ref 5–15)
BUN: 20 mg/dL (ref 8–23)
CO2: 17 mmol/L — ABNORMAL LOW (ref 22–32)
Calcium: 8.1 mg/dL — ABNORMAL LOW (ref 8.9–10.3)
Chloride: 113 mmol/L — ABNORMAL HIGH (ref 98–111)
Creatinine, Ser: 1.27 mg/dL — ABNORMAL HIGH (ref 0.44–1.00)
GFR, Estimated: 48 mL/min — ABNORMAL LOW (ref >60)
Glucose, Bld: 64 mg/dL — ABNORMAL LOW (ref 70–99)
Potassium: 4.9 mmol/L (ref 3.5–5.1)
Sodium: 133 mmol/L — ABNORMAL LOW (ref 135–145)

## 2021-08-19 LAB — FERRITIN: Ferritin: 145 ng/mL (ref 11–307)

## 2021-08-19 LAB — IRON AND TIBC
Iron: 64 ug/dL (ref 28–170)
Saturation Ratios: 20 % (ref 10.4–31.8)
TIBC: 315 ug/dL (ref 250–450)
UIBC: 251 ug/dL

## 2021-08-19 LAB — GASTROINTESTINAL PANEL BY PCR, STOOL (REPLACES STOOL CULTURE)

## 2021-08-19 LAB — PHOSPHORUS: Phosphorus: 2.5 mg/dL (ref 2.5–4.6)

## 2021-08-19 LAB — FOLATE: Folate: 9 ng/mL (ref >5.9)

## 2021-08-19 LAB — MAGNESIUM: Magnesium: 2 mg/dL (ref 1.7–2.4)

## 2021-08-19 MED ORDER — CHOLESTYRAMINE 4 G PO PACK
4.0000 g | PACK | Freq: Three times a day (TID) | ORAL | Status: DC
  Administered 2021-08-19 – 2021-08-20 (×5): 4 g via ORAL
  Filled 2021-08-19 (×6): qty 1

## 2021-08-19 NOTE — Progress Notes (Addendum)
Physical Therapy Treatment Patient Details Name: RESHONDA KOERBER MRN: [id number] DOB: 12-09-1960 Today's Date: 08/19/2021   History of Present Illness Pt adm 10/10 with AKI, electrolyte abnormalities, and confusion due to high output from ostomy. PMH - Crohn's, ileostomy, htn, anxiety, depression, recent hospitalization (9/6-9/11) with AKI and electrolyte abnormalities in setting of N/V/D.    PT Comments    Pt is up to side of bed dealing with a leaky ostomy when PT arrived, and was assisted to get the cleaning up done so nursing could come in and get the ostomy to seal.  Had good session with practice of high level balance skills, and declined to discharge her from PT based on the ongoing balance issues.  Pt is lower fall risk but has some issues of control when counting on her RLE for balance.   Follow her to challenge her fast balance responses, and will increase gait distances when pt is off enteric precautions.  Follow up for acute PT goals.   Recommendations for follow up therapy are one component of a multi-disciplinary discharge planning process, led by the attending physician.  Recommendations may be updated based on patient status, additional functional criteria and insurance authorization.  Follow Up Recommendations  Home health PT     Equipment Recommendations  None recommended by PT    Recommendations for Other Services       Precautions / Restrictions Precautions Precautions: Other (comment) Precaution Comments: ostomy Restrictions Weight Bearing Restrictions: No     Mobility  Bed Mobility Overal bed mobility: Modified Independent                  Transfers Overall transfer level: Modified independent Equipment used: None                Ambulation/Gait Ambulation/Gait assistance: Supervision Gait Distance (Feet): 60 Feet Assistive device: None Gait Pattern/deviations: Step-through pattern;Decreased stride length Gait velocity:  reduced Gait velocity interpretation: <1.31 ft/sec, indicative of household ambulator General Gait Details: remained in room due to new diagnosis of norovirus   Stairs             Wheelchair Mobility    Modified Rankin (Stroke Patients Only)       Balance Overall balance assessment: Needs assistance Sitting-balance support: Feet supported Sitting balance-Leahy Scale: Good     Standing balance support: No upper extremity supported Standing balance-Leahy Scale: Fair               High level balance activites: Side stepping;Braiding;Backward walking;Direction changes;Turns;Sudden stops;Other (comment) (eyes closed in standing, SLS) High Level Balance Comments: requires supervision for tapping step with feet, but can capture her loss of balance            Cognition Arousal/Alertness: Awake/alert Behavior During Therapy: WFL for tasks assessed/performed Overall Cognitive Status: Within Functional Limits for tasks assessed                                 General Comments: feeling a bit tired from the new GI issues but overall is positive      Exercises      General Comments General comments (skin integrity, edema, etc.): Pt is in room covered in feces on her midsection, assisted to get this cleaned and then nursing in to help with resealing her ostomy.  Pt was given clean gown to change into and then did balance and gait work in her room  Pertinent Vitals/Pain Pain Assessment: No/denies pain    Home Living                      Prior Function            PT Goals (current goals can now be found in the care plan section) Acute Rehab PT Goals Patient Stated Goal: Get stronger Progress towards PT goals: Progressing toward goals    Frequency    Min 3X/week      PT Plan Current plan remains appropriate    Co-evaluation              AM-PAC PT "6 Clicks" Mobility   Outcome Measure  Help needed turning from your  back to your side while in a flat bed without using bedrails?: None Help needed moving from lying on your back to sitting on the side of a flat bed without using bedrails?: None Help needed moving to and from a bed to a chair (including a wheelchair)?: None Help needed standing up from a chair using your arms (e.g., wheelchair or bedside chair)?: None Help needed to walk in hospital room?: None Help needed climbing 3-5 steps with a railing? : A Little 6 Click Score: 23    End of Session   Activity Tolerance: Patient tolerated treatment well Patient left: in chair;with call bell/phone within reach;with nursing/sitter in room Nurse Communication: Mobility status PT Visit Diagnosis: Muscle weakness (generalized) (M62.81);Other abnormalities of gait and mobility (R26.89)     Time: 8118-8677 PT Time Calculation (min) (ACUTE ONLY): 30 min  Charges:  $Gait Training: 8-22 mins $Neuromuscular Re-education: 8-22 mins                Ramond Dial 08/19/2021, 2:04 PM  Mee Hives, PT PhD Acute Rehab Dept. Number: [place] [facility] [place]

## 2021-08-19 NOTE — Progress Notes (Signed)
PROGRESS NOTE    Cynthia [name]  HUD:[id number] DOB: 1961-03-24 DOA: 08/12/2021 PCP: Denita Lung, MD    Brief Narrative:  60 year old with anxiety, depression, Crohn's disease with fistula status post ileostomy and high output, hypertension, GERD, restless leg syndrome presented to ER with generalized fatigue, weakness, excessive output and nausea vomiting.  Found to have acute kidney injury, hyponatremia and multiple electrolyte abnormalities.   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 Treated with aggressive IV rehydration.  Her renal functions are normalizing.   Renal functions and electrolytes are stabilized today. Will discontinue IV fluids and monitor whether she can keep up with oral hydration and electrolytes. High output ileostomy, measured stool output decreased last 24 hours to less than 2 L.  On multiple intervention including Imodium 4 times a day, cholestyramine increased to 3 times a day, on Lomotil 2 times a day, and Metamucil 2 times a day. Followed by GI.    Crohn's disease: Status post subtotal colectomy and ileostomy.  GI following. New CT scan with no evidence of recurrent disease.  Anxiety and depression: Continue venlafaxine from home.  Anemia of chronic disease: No obvious blood loss.  We will check her iron panel.  May benefit with IV iron.  She has multiple micronutrients deficiencies, will add multivitamins.  Discontinue IV fluids.  Mobilize.  Will monitor if she can maintain by mouth to prepare for discharge by tomorrow.   DVT  prophylaxis: enoxaparin (LOVENOX) injection 40 mg Start: 08/17/21 2000   Code Status: Full code Family Communication: None Disposition Plan: Status is: Inpatient  Remains inpatient appropriate because:IV treatments appropriate due to intensity of illness or inability to take PO and Inpatient level of care appropriate due to severity of illness  Dispo: The patient is from: Home              Anticipated d/c is to: Home              Patient currently is not medically stable to d/c.   Difficult to place patient No         Consultants:  Gastroenterology  Procedures:  None  Antimicrobials:  Rocephin 10/11--- 10/13.   Subjective: Patient seen and examined.  No overnight events.  She thinks her stool is getting little bit thicker.  Denies any nausea vomiting.  Eating regular diet and able to drink liquids without trouble.  Objective: Vitals:   08/18/21 0500 08/18/21 1117 08/18/21 2124 08/19/21 0429  BP: 98/61 (!) 98/52 (!) 92/56 91/63  Pulse: 79 84 73 73  Resp: 18 16 18    Temp: 98.4 F (36.9 C) 98.7 F (37.1 C) 98.4 F (36.9 C) 98.2 F (36.8 C)  TempSrc: Oral Oral Oral Oral  SpO2: 100% 100% 100% 100%  Weight: 51.2 kg   53.5 kg  Height:        Intake/Output Summary (Last 24 hours) at 08/19/2021 0950 Last data filed at 08/19/2021 0819 Gross per 24 hour  Intake 2317.53 ml  Output 1800 ml  Net 517.53 ml   Filed Weights   08/17/21 0500 08/18/21 0500 08/19/21 0429  Weight:  47.5 kg 51.2 kg 53.5 kg    Examination:  General exam: Appears calm and comfortable .  She looks comfortable.  She is eating breakfast. Respiratory system: Clear to auscultation. Respiratory effort normal. Cardiovascular system: S1 & S2 heard, RRR. No JVD, murmurs, rubs, gallops or clicks. No pedal edema. Gastrointestinal system: Soft.  Nontender.  Large amount of liquid stool on ileostomy.  Nontender.    Data Reviewed: I have personally reviewed following labs and imaging  studies  CBC: Recent Labs  Lab 08/15/21 0123 08/16/21 0406 08/17/21 0329 08/18/21 0327 08/19/21 0336  WBC 7.0 9.5 8.5 8.9 7.7  NEUTROABS 4.1 6.5 5.6 5.7 4.5  HGB 8.7* 8.4* 7.9* 7.6* 7.4*  HCT 25.6* 25.8* 24.5* 23.1* 23.8*  MCV 88.9 91.5 92.8 93.1 95.2  PLT 197 187 132* 158 332   Basic Metabolic Panel: Recent Labs  Lab 08/15/21 0123 08/16/21 0406 08/17/21 0329 08/18/21 0327 08/19/21 0336  NA 132* 132* 131* 133* 133*  K 4.7 4.6 5.1 4.5 4.9  CL 108 110 111 113* 113*  CO2 19* 17* 15* 17* 17*  GLUCOSE 83 58* 59* 71 64*  BUN 35* 25* 24* 17 20  CREATININE 1.48* 1.41* 1.26* 1.31* 1.27*  CALCIUM 8.3* 8.3* 8.0* 8.0* 8.1*  MG 2.0 1.4* 2.0 1.4* 2.0  PHOS 1.5* 1.8* 1.8* 2.1* 2.5   GFR: Estimated Creatinine Clearance: 39.3 mL/min (A) (by C-G formula based on SCr of 1.27 mg/dL (H)). Liver Function Tests: Recent Labs  Lab 08/12/21 1510  AST 21  ALT 14  ALKPHOS 58  BILITOT 0.5  PROT 7.4  ALBUMIN 4.0   No results for input(s): LIPASE, AMYLASE in the last 168 hours. No results for input(s): AMMONIA in the last 168 hours. Coagulation Profile: Recent Labs  Lab 08/12/21 1510  INR 1.1   Cardiac Enzymes: No results for input(s): CKTOTAL, CKMB, CKMBINDEX, TROPONINI in the last 168 hours. BNP (last 3 results) No results for input(s): PROBNP in the last 8760 hours. HbA1C: No results for input(s): HGBA1C in the last 72 hours. CBG: Recent Labs  Lab 08/13/21 0908 08/13/21 1109  GLUCAP 47* 99   Lipid Profile: No results for input(s): CHOL, HDL, LDLCALC, TRIG, CHOLHDL, LDLDIRECT in the last 72 hours. Thyroid Function Tests: No results for input(s): TSH, T4TOTAL, FREET4, T3FREE, THYROIDAB in the last 72 hours. Anemia Panel: No results for input(s): VITAMINB12, FOLATE, FERRITIN, TIBC, IRON, RETICCTPCT in the last 72 hours.  Sepsis Labs: Recent Labs  Lab 08/12/21 1510 08/12/21 1906  LATICACIDVEN 2.9* 3.0*    Recent Results (from the past 240 hour(s))  Culture, blood  (Routine x 2)     Status: None   Collection Time: 08/12/21  3:30 PM   Specimen: BLOOD LEFT HAND  Result Value Ref Range Status   Specimen Description BLOOD LEFT HAND  Final   Special Requests   Final    BOTTLES DRAWN AEROBIC AND ANAEROBIC Blood Culture results may not be optimal due to an inadequate volume of blood received in culture bottles   Culture   Final    NO GROWTH 5 DAYS Performed at [facility], [place] [address] [place], [place] [address]    Report Status 08/17/2021 FINAL  Final  Culture, blood (Routine x 2)     Status: None   Collection Time: 08/12/21  3:43 PM   Specimen: BLOOD  Result Value Ref Range Status   Specimen Description BLOOD RIGHT ANTECUBITAL  Final   Special Requests   Final  BOTTLES DRAWN AEROBIC AND ANAEROBIC Blood Culture results may not be optimal due to an inadequate volume of blood received in culture bottles   Culture   Final    NO GROWTH 5 DAYS Performed at [facility], [place] [address] [place], [place] [address]    Report Status 08/17/2021 FINAL  Final  Resp Panel by RT-PCR (Flu A&B, Covid) Nasopharyngeal Swab     Status: None   Collection Time: 08/12/21  5:16 PM   Specimen: Nasopharyngeal Swab; Nasopharyngeal(NP) swabs in vial transport medium  Result Value Ref Range Status   SARS Coronavirus 2 by RT PCR NEGATIVE NEGATIVE Final    Comment: (NOTE) SARS-CoV-2 target nucleic acids are NOT DETECTED.  The SARS-CoV-2 RNA is generally detectable in upper respiratory specimens during the acute phase of infection. The lowest concentration of SARS-CoV-2 viral copies this assay can detect is 138 copies/mL. A negative result does not preclude SARS-Cov-2 infection and should not be used as the sole basis for treatment or other patient management decisions. A negative result may occur with  improper specimen collection/handling, submission of specimen other than nasopharyngeal swab, presence of viral mutation(s) within the areas targeted by  this assay, and inadequate number of viral copies(<138 copies/mL). A negative result must be combined with clinical observations, patient history, and epidemiological information. The expected result is Negative.  Fact Sheet for Patients:  EntrepreneurPulse.com.au  Fact Sheet for Healthcare Providers:  IncredibleEmployment.be  This test is no t yet approved or cleared by the [place] FDA and  has been authorized for detection and/or diagnosis of SARS-CoV-2 by FDA under an Emergency Use Authorization (EUA). This EUA will remain  in effect (meaning this test can be used) for the duration of the COVID-19 declaration under Section 564(b)(1) of the Act, 21 U.S.C.section 360bbb-3(b)(1), unless the authorization is terminated  or revoked sooner.       Influenza A by PCR NEGATIVE NEGATIVE Final   Influenza B by PCR NEGATIVE NEGATIVE Final    Comment: (NOTE) The Xpert Xpress SARS-CoV-2/FLU/RSV plus assay is intended as an aid in the diagnosis of influenza from Nasopharyngeal swab specimens and should not be used as a sole basis for treatment. Nasal washings and aspirates are unacceptable for Xpert Xpress SARS-CoV-2/FLU/RSV testing.  Fact Sheet for Patients: EntrepreneurPulse.com.au  Fact Sheet for Healthcare Providers: IncredibleEmployment.be  This test is not yet approved or cleared by the [place] FDA and has been authorized for detection and/or diagnosis of SARS-CoV-2 by FDA under an Emergency Use Authorization (EUA). This EUA will remain in effect (meaning this test can be used) for the duration of the COVID-19 declaration under Section 564(b)(1) of the Act, 21 U.S.C. section 360bbb-3(b)(1), unless the authorization is terminated or revoked.  Performed at [facility], [place] [address] [place], [place] [address]   C Difficile Quick Screen (NO PCR Reflex)     Status: None   Collection Time:  08/17/21 11:52 AM   Specimen: STOOL  Result Value Ref Range Status   C Diff antigen NEGATIVE NEGATIVE Final   C Diff toxin NEGATIVE NEGATIVE Final   C Diff interpretation No C. difficile detected.  Final    Comment: Performed at [facility], [place] [address] [place], [place] [address]         Radiology Studies: CT ENTERO ABD/PELVIS W CONTAST  Result Date: 08/17/2021 CLINICAL DATA:  Inflammatory bowel disease EXAM: CT ABDOMEN AND PELVIS WITH CONTRAST (ENTEROGRAPHY) TECHNIQUE: Multidetector CT of the abdomen and  pelvis during bolus administration of intravenous contrast. Negative oral contrast was given. CONTRAST:  47m OMNIPAQUE IOHEXOL 300 MG/ML SOLN, additional negative oral enteric contrast COMPARISON:  08/12/2021 FINDINGS: Lower chest: Trace pleural effusions. Hepatobiliary: No solid liver abnormality is seen. No gallstones, gallbladder wall thickening, or biliary dilatation. Pancreas: Unremarkable. No pancreatic ductal dilatation or surrounding inflammatory changes. Spleen: Normal in size without significant abnormality. Adrenals/Urinary Tract: Adrenal glands are unremarkable. Kidneys are normal, without renal calculi, solid lesion, or hydronephrosis. Bladder is unremarkable. Stomach/Bowel: Stomach is within normal limits. Status post ileocecal resection with end ileostomy. The colon is decompressed to the rectum. Limited distension of the small bowel; within this limitation, no acute inflammatory findings Vascular/Lymphatic: No significant vascular findings are present. No enlarged abdominal or pelvic lymph nodes. Reproductive: No mass or other significant abnormality. Other: Anasarca.  Small volume fluid in the pelvis. Musculoskeletal: No acute or significant osseous findings. IMPRESSION: 1. Status post ileocecal resection with end ileostomy. 2. Limited distension of the small bowel; within this limitation, no acute inflammatory findings. 3. Trace pleural effusions, small volume ascites  and anasarca. Electronically Signed   By: ADelanna AhmadiM.D.   On: 08/17/2021 19:06        Scheduled Meds:  cholestyramine  4 g Oral TID   diphenoxylate-atropine  2 tablet Oral BID   enoxaparin (LOVENOX) injection  40 mg Subcutaneous Q24H   feeding supplement  237 mL Oral TID BM   loperamide  2 mg Oral QID   midodrine  5 mg Oral TID WC   multivitamin with minerals  1 tablet Oral Daily   pantoprazole  40 mg Oral Daily   pramipexole  1 mg Oral Daily   psyllium  1 packet Oral BID   sodium chloride flush  3 mL Intravenous Q12H   venlafaxine XR  300 mg Oral Q breakfast   Continuous Infusions:     LOS: 6 days    Time spent: 32 minutes    KBarb Merino MD [facility] Pager [phone]

## 2021-08-19 NOTE — Telephone Encounter (Signed)
Patient dismissed from [facility] by Lucio Edward, MD, effective 08/15/21. Dismissal Letter sent out by 1st class mail. [employer]

## 2021-08-19 NOTE — TOC Progression Note (Signed)
Transition of Care [facility]) - Progression Note    Patient Details  Name: Cynthia [name] MRN: [id number] Date of Birth: [birth year]  [facility]) CM/SW Contact  Zenon Mayo, RN Phone Number: 08/19/2021, 8:51 PM  Clinical Narrative:    From home, AKI,  rhinovirus ,electrolyte imbalance and confusion due to High output ileostomy.  Per MD note will trial to see if she can maintain w/out ivf's if cret stable, if she can then possible for dc tomorrow.  TOC will continue to follow for dc needs.         Expected Discharge Plan and Services                                                 Social Determinants of Health (SDOH) Interventions    Readmission Risk Interventions Readmission Risk Prevention Plan 04/09/2021  Transportation Screening Complete  Medication Review [employer]) Complete  PCP or Specialist appointment within 3-5 days of discharge Complete  HRI or [place] Complete  SW Recovery Care/Counseling Consult Complete  [place] Not Applicable  Some recent data might be hidden

## 2021-08-19 NOTE — Progress Notes (Addendum)
Daily Rounding Note  08/19/2021, 8:25 AM  LOS: 6 days   SUBJECTIVE:   Chief complaint:    N/V, high ileostomy output, abd pain.   All sxs better.  Tolerating solid food w no nausea starting yesterday.  Stool stll loose,  no blood but has more substance, less watery.  Pt says output volume is about the same.  Measure stool 1.8 liters yest, c/w 3.6 litres previously.  No abd pain  OBJECTIVE:         Vital signs in last 24 hours:    Temp:  [98.2 F (36.8 C)-98.7 F (37.1 C)] 98.2 F (36.8 C) (10/17 0429) Pulse Rate:  [73-84] 73 (10/17 0429) Resp:  [16-18] 18 (10/16 2124) BP: (91-98)/(52-63) 91/63 (10/17 0429) SpO2:  [100 %] 100 % (10/17 0429) Weight:  [53.5 kg] 53.5 kg (10/17 0429) Last BM Date: 08/18/21 Filed Weights   08/17/21 0500 08/18/21 0500 08/19/21 0429  Weight: 47.5 kg 51.2 kg 53.5 kg   General: not ill looking.  Coomfortable and alert.  Heart: RRR Chest: clear w/O labored breathing Abdomen: soft, ND.  Liquid brown stool in ostomy.  Not tender.  BS active  Extremities: no CCE Neuro/Psych:  alert, calm, not confused, fluid speech.  No gross deficits.     Intake/Output from previous day: 10/16 0701 - 10/17 0700 In: 2497.5 [P.O.:1300; I.V.:1065.1; IV Piggyback:132.4] Out: 1800 [Stool:1800]  Intake/Output this shift: Total I/O In: 240 [P.O.:240] Out: -   Lab Results: Recent Labs    08/17/21 0329 08/18/21 0327 08/19/21 0336  WBC 8.5 8.9 7.7  HGB 7.9* 7.6* 7.4*  HCT 24.5* 23.1* 23.8*  PLT 132* 158 162   BMET Recent Labs    08/17/21 0329 08/18/21 0327 08/19/21 0336  NA 131* 133* 133*  K 5.1 4.5 4.9  CL 111 113* 113*  CO2 15* 17* 17*  GLUCOSE 59* 71 64*  BUN 24* 17 20  CREATININE 1.26* 1.31* 1.27*  CALCIUM 8.0* 8.0* 8.1*   LFT No results for input(s): PROT, ALBUMIN, AST, ALT, ALKPHOS, BILITOT, BILIDIR, IBILI in the last 72 hours. PT/INR No results for input(s): LABPROT, INR in the last  72 hours. Hepatitis Panel No results for input(s): HEPBSAG, HCVAB, HEPAIGM, HEPBIGM in the last 72 hours.  Studies/Results: CT ENTERO ABD/PELVIS W CONTAST  Result Date: 08/17/2021 CLINICAL DATA:  Inflammatory bowel disease EXAM: CT ABDOMEN AND PELVIS WITH CONTRAST (ENTEROGRAPHY) TECHNIQUE: Multidetector CT of the abdomen and pelvis during bolus administration of intravenous contrast. Negative oral contrast was given. CONTRAST:  59m OMNIPAQUE IOHEXOL 300 MG/ML SOLN, additional negative oral enteric contrast COMPARISON:  08/12/2021 FINDINGS: Lower chest: Trace pleural effusions. Hepatobiliary: No solid liver abnormality is seen. No gallstones, gallbladder wall thickening, or biliary dilatation. Pancreas: Unremarkable. No pancreatic ductal dilatation or surrounding inflammatory changes. Spleen: Normal in size without significant abnormality. Adrenals/Urinary Tract: Adrenal glands are unremarkable. Kidneys are normal, without renal calculi, solid lesion, or hydronephrosis. Bladder is unremarkable. Stomach/Bowel: Stomach is within normal limits. Status post ileocecal resection with end ileostomy. The colon is decompressed to the rectum. Limited distension of the small bowel; within this limitation, no acute inflammatory findings Vascular/Lymphatic: No significant vascular findings are present. No enlarged abdominal or pelvic lymph nodes. Reproductive: No mass or other significant abnormality. Other: Anasarca.  Small volume fluid in the pelvis. Musculoskeletal: No acute or significant osseous findings. IMPRESSION: 1. Status post ileocecal resection with end ileostomy. 2. Limited distension of the small bowel; within this limitation,  no acute inflammatory findings. 3. Trace pleural effusions, small volume ascites and anasarca. Electronically Signed   By: Delanna Ahmadi M.D.   On: 08/17/2021 19:06    Scheduled Meds:  cholestyramine  4 g Oral BID   diphenoxylate-atropine  2 tablet Oral BID   enoxaparin (LOVENOX)  injection  40 mg Subcutaneous Q24H   feeding supplement  237 mL Oral TID BM   loperamide  2 mg Oral QID   midodrine  5 mg Oral TID WC   multivitamin with minerals  1 tablet Oral Daily   pantoprazole  40 mg Oral Daily   pramipexole  1 mg Oral Daily   psyllium  1 packet Oral BID   sodium chloride flush  3 mL Intravenous Q12H   venlafaxine XR  300 mg Oral Q breakfast   Continuous Infusions: PRN Meds:.acetaminophen **OR** [DISCONTINUED] acetaminophen, zolpidem   ASSESMENT:     Increased ileostomy output and N/V.  Cyclic episode w prvious admisions.  Subtotal colectomy, ileostomy 02/2021 due to fistulizing Crohn's.  C diff negative, stool path PCR in process.  Last colonoscopy was 08/2019: Crohns and TA polyps.  CT enterography: post surgical anatomy, limited SB distention w/O inflammation.  Small ascites, anasarca.  Meds now are questran, lomotil, imodium, psylium.      Dehydration, AKI from volume loss of stools, N/V.  BUN/creat improving.        Cellulitis    Hyponatremia.  Na 120 >> 133.      [place] anemia.  Chronic issue.  Hgb of 5.1 on 9/8.  13.3 (in setting volume contraction) >> 7.4 this admit   PLAN     Check anemia labs.  Upped Questran to tid.       From GI perspective ok to dc pt home today.  Note pt dismissed from [facility] practice (letter sent last week and will need to find another GI provider.      Azucena Freed  08/19/2021, 8:25 AM Phone [phone]     Attending physician's note   I have taken an interval history, reviewed the chart and examined the patient. I agree with the Advanced Practitioner's note, impression and recommendations.   60yrold with fistulizing Crohn's s/p subtotal colectomy with ileostomy 02/2021. CTA neg for any active Crohn's. Neg C Diff. Ostomy output decreasing.  Patient feels better  Plan: -Continue psyllium/Lomotil/Imodium -Increase cholestyramine TID -Can always consider octreotide as outpt. -Regular food.  Would like to reduce  feeding supplements (Ensure) to BID as outpt (may contribute to diarrhea) -Anticipate D/C in AM. -Unfortunately, has been D/C from [facility] practice d/t noncompliance.  She will have to find another GI provider. -Will sign off for now    RCarmell Austria MD [phone]  Addendum-I have informed patient regarding [facility] practice manager's decision regarding discharge from practice.  Given her condition, would strongly recommend to get established with new GI as soon as possible.

## 2021-08-20 DIAGNOSIS — E8729 Other acidosis: Secondary | ICD-10-CM | POA: Diagnosis not present

## 2021-08-20 DIAGNOSIS — K50013 Crohn's disease of small intestine with fistula: Secondary | ICD-10-CM | POA: Diagnosis not present

## 2021-08-20 DIAGNOSIS — N179 Acute kidney failure, unspecified: Secondary | ICD-10-CM | POA: Diagnosis not present

## 2021-08-20 LAB — CBC WITH DIFFERENTIAL/PLATELET
Abs Immature Granulocytes: 0.04 10*3/uL (ref 0.00–0.07)
Basophils Absolute: 0 10*3/uL (ref 0.0–0.1)
Basophils Relative: 0 %
Eosinophils Absolute: 0.4 10*3/uL (ref 0.0–0.5)
Eosinophils Relative: 4 %
HCT: 25.1 % — ABNORMAL LOW (ref 36.0–46.0)
Hemoglobin: 7.9 g/dL — ABNORMAL LOW (ref 12.0–15.0)
Immature Granulocytes: 0 %
Lymphocytes Relative: 25 %
Lymphs Abs: 2.2 10*3/uL (ref 0.7–4.0)
MCH: 30.5 pg (ref 26.0–34.0)
MCHC: 31.5 g/dL (ref 30.0–36.0)
MCV: 96.9 fL (ref 80.0–100.0)
Monocytes Absolute: 0.5 10*3/uL (ref 0.1–1.0)
Monocytes Relative: 6 %
Neutro Abs: 5.8 10*3/uL (ref 1.7–7.7)
Neutrophils Relative %: 65 %
Platelets: 204 10*3/uL (ref 150–400)
RBC: 2.59 MIL/uL — ABNORMAL LOW (ref 3.87–5.11)
RDW: 14.6 % (ref 11.5–15.5)
WBC: 8.9 10*3/uL (ref 4.0–10.5)
nRBC: 0 % (ref 0.0–0.2)

## 2021-08-20 LAB — BASIC METABOLIC PANEL
Anion gap: 4 — ABNORMAL LOW (ref 5–15)
BUN: 24 mg/dL — ABNORMAL HIGH (ref 8–23)
CO2: 17 mmol/L — ABNORMAL LOW (ref 22–32)
Calcium: 8.4 mg/dL — ABNORMAL LOW (ref 8.9–10.3)
Chloride: 111 mmol/L (ref 98–111)
Creatinine, Ser: 1.22 mg/dL — ABNORMAL HIGH (ref 0.44–1.00)
GFR, Estimated: 50 mL/min — ABNORMAL LOW (ref >60)
Glucose, Bld: 67 mg/dL — ABNORMAL LOW (ref 70–99)
Potassium: 5.2 mmol/L — ABNORMAL HIGH (ref 3.5–5.1)
Sodium: 132 mmol/L — ABNORMAL LOW (ref 135–145)

## 2021-08-20 LAB — PHOSPHORUS: Phosphorus: 1.7 mg/dL — ABNORMAL LOW (ref 2.5–4.6)

## 2021-08-20 LAB — VITAMIN C: Vitamin C: 2 mg/dL (ref 0.4–2.0)

## 2021-08-20 LAB — MAGNESIUM: Magnesium: 1.4 mg/dL — ABNORMAL LOW (ref 1.7–2.4)

## 2021-08-20 MED ORDER — SODIUM PHOSPHATES 45 MMOLE/15ML IV SOLN
15.0000 mmol | Freq: Once | INTRAVENOUS | Status: AC
  Administered 2021-08-20: 15 mmol via INTRAVENOUS
  Filled 2021-08-20: qty 5

## 2021-08-20 MED ORDER — LOPERAMIDE HCL 2 MG PO CAPS: 2.0000 mg | ORAL_CAPSULE | Freq: Four times a day (QID) | ORAL | 0 refills | Status: DC

## 2021-08-20 MED ORDER — ADULT MULTIVITAMIN W/MINERALS CH: 1.0000 | ORAL_TABLET | Freq: Every day | ORAL | 0 refills | Status: AC

## 2021-08-20 MED ORDER — MIDODRINE HCL 5 MG PO TABS: 5.0000 mg | ORAL_TABLET | Freq: Three times a day (TID) | ORAL | 0 refills | Status: AC

## 2021-08-20 MED ORDER — CHOLESTYRAMINE 4 G PO PACK: 4.0000 g | PACK | Freq: Three times a day (TID) | ORAL | 0 refills | Status: DC

## 2021-08-20 MED ORDER — MAGNESIUM OXIDE -MG SUPPLEMENT 400 (240 MG) MG PO TABS: 400.0000 mg | ORAL_TABLET | Freq: Two times a day (BID) | ORAL | 0 refills | Status: AC

## 2021-08-20 MED ORDER — DIPHENOXYLATE-ATROPINE 2.5-0.025 MG PO TABS: 2.0000 | ORAL_TABLET | Freq: Two times a day (BID) | ORAL | 0 refills | Status: DC

## 2021-08-20 MED ORDER — PSYLLIUM 95 % PO PACK: 1.0000 | PACK | Freq: Two times a day (BID) | ORAL | 0 refills | Status: AC

## 2021-08-20 MED ORDER — MAGNESIUM OXIDE -MG SUPPLEMENT 400 (240 MG) MG PO TABS
800.0000 mg | ORAL_TABLET | Freq: Once | ORAL | Status: AC
  Administered 2021-08-20: 800 mg via ORAL
  Filled 2021-08-20: qty 2

## 2021-08-20 MED ORDER — POTASSIUM PHOSPHATES 15 MMOLE/5ML IV SOLN
20.0000 mmol | Freq: Once | INTRAVENOUS | Status: DC
  Filled 2021-08-20: qty 6.67

## 2021-08-20 MED ORDER — MAGNESIUM SULFATE 4 GM/100ML IV SOLN
4.0000 g | Freq: Once | INTRAVENOUS | Status: DC
  Filled 2021-08-20: qty 100

## 2021-08-20 NOTE — TOC Initial Note (Addendum)
Transition of Care [facility]) - Initial/Assessment Note    Patient Details  Name: Cynthia [name] MRN: [id number] Date of Birth: August 21, 1961  [facility] CM/SW Contact:    Zenon Mayo, RN Phone Number: 08/20/2021, 1:17 PM  Clinical Narrative:                 NCM offered choice for HHPT, she chose [facility], NCM made referral to [facility] awaiting call back. NCM called pharmacy ,gave medicaid number her scripts will be 20.00 , informed the patient, she is ok with this.  Kenzie with [facility] states she does not have the staff for referral.  NCM checking with other agencies to see if they can take referral for HHPT. [facility] was not able to take referral.  Patient states she is able to do oupatient pt.  NCM made referral to [facility] st outpatient pt.   Expected Discharge Plan: [place] Barriers to Discharge: No Barriers Identified   Patient Goals and CMS Choice   CMS Medicare.gov Compare Post Acute Care list provided to:: Patient Choice offered to / list presented to : Patient  Expected Discharge Plan and Services Expected Discharge Plan: [place]   Discharge Planning Services: CM Consult   Living arrangements for the past 2 months: Single Family Home Expected Discharge Date: 08/20/21                 DME Agency: NA       HH Arranged: PT HH Agency: [place] ([place]) Date HH Agency Contacted: 08/20/21 Time HH Agency Contacted: [phone] Representative spoke with at [place]: [place] Arrangements/Services Living arrangements for the past 2 months: [place] with:: Roommate Patient language and need for interpreter reviewed:: Yes Do you feel safe going back to the place where you live?: Yes            Criminal Activity/Legal Involvement Pertinent to Current Situation/Hospitalization: No - Comment as needed  Activities of Daily Living      Permission Sought/Granted                   Emotional Assessment Appearance:: Appears stated age Attitude/Demeanor/Rapport: Engaged Affect (typically observed): Appropriate Orientation: : Oriented to  Time, Oriented to Situation, Oriented to Place, Oriented to Self Alcohol / Substance Use: Not Applicable Psych Involvement: No (comment)  Admission diagnosis:  Hypokalemia [W54.6] Metabolic acidosis [E70.35] Acute renal failure (ARF) (HCC) [N17.9] AKI (acute kidney injury) ([place]) [N17.9] Patient Active Problem List   Diagnosis Date Noted   Acute renal failure (ARF) ([place]) 08/12/2021   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[place])    Protein-calorie malnutrition, severe 07/11/2021   Hypotension 07/10/2021   High anion gap metabolic acidosis 00/93/8182   Acute renal failure, unspecified acute renal failure type ([place]) 07/10/2021   Shock circulatory ([place])    Nausea and vomiting 06/26/2021   Abdominal pain 06/26/2021   Sepsis ([place]) 06/26/2021   Sepsis due to cellulitis ([place]) 04/05/2021   AKI (acute kidney injury) ([place]) 04/05/2021   Hyperkalemia 04/05/2021   Hyponatremia 04/05/2021   Adjustment disorder 03/14/2021   Unspecified severe protein-calorie malnutrition ([place]) 03/13/2021   COVID-19 01/25/2021   Leukocytosis 01/24/2021   Crohn's colitis, with intestinal obstruction ([place]) 01/24/2021   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   History of rectal polyp 2020 01/12/2021   Anxiety associated with depression 01/12/2021   Hypokalemia 12/04/2020   Cough 11/27/2020   Otalgia of both  ears 11/27/2020   Body aches 11/27/2020   Fever 11/27/2020   High risk medication use 11/27/2020   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[place]) 06/27/2020   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   Crohn's ileitis, with intestinal obstruction ([place]) 10/05/2019   Colitis    Rectal polyp    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   RLS (restless legs syndrome)  09/25/2014   Current smoker 09/19/2011   Migraine headache 09/19/2011   Essential hypertension 09/19/2011   Obesity (BMI 30-39.9) 09/19/2011   History of serrated colonic polyp 01/13/2011   Gastroesophageal reflux disease 03/17/2008   MVP (mitral valve prolapse) 03/17/2008   HIATAL HERNIA 05/03/2002   PCP:  Denita Lung, MD Pharmacy:   [id number]Phone: [phone]: [phone]    Social Determinants of Health (SDOH) Interventions    Readmission Risk Interventions Readmission Risk Prevention Plan 08/20/2021 04/09/2021  Transportation Screening Complete Complete  Medication Review ([place] Complete Complete  PCP or Specialist appointment within 3-5 days of discharge Complete Complete  HRI or Home Care Consult Complete Complete  SW Recovery Care/Counseling Consult Complete Complete  Palliative Care Screening Not Applicable [place] Not Applicable  Some recent data might be hidden

## 2021-08-20 NOTE — Progress Notes (Signed)
  Mobility Specialist Criteria Algorithm Info.  Mobility Team: [facility] elevated:Self regulated Activity: Ambulated in room; Dangled on edge of bed Range of motion: Active; All extremities Level of assistance: Independent Assistive device: None Minutes sitting in chair:  Minutes stood:  Minutes ambulated: 2 minutes Distance ambulated (ft): 50 ft Mobility response: Tolerated well Bed Position: Semi-fowlers  Patient ambulated in room independently without complaint or incident.   08/20/2021 3:01 PM

## 2021-08-20 NOTE — Discharge Summary (Signed)
Physician Discharge Summary  Cynthia [name] CBJ:[id number] DOB: 1960-11-06 DOA: 08/12/2021  PCP: Denita Lung, MD  Admit date: 08/12/2021 Discharge date: 08/20/2021  Admitted From: Home Disposition: Home with home health   Recommendations for Outpatient Follow-up:  Follow up with PCP in 1-2 weeks Please obtain BMP/CBC/magnesium/phosphorus in one week Will send referral to new gastroenterology, follow-up.  Home Health: Physical therapy Equipment/Devices: None  Discharge Condition: Stable CODE STATUS: Full code Diet recommendation: Regular diet  Discharge summary:  60 year old female with history of anxiety, depression, Crohn's disease with fistula status post ileostomy and high output, hypertension, GERD, restless leg syndrome presented to the emergency room with generalized fatigue, weakness, excessive output nausea and vomiting.  She was found to have acute kidney injury, hyponatremia and multiple electrolyte abnormalities mostly related to poor oral intake and excess ostomy output.  She was admitted to the hospital and treated with aggressive IV fluids, aggressive replacement of electrolytes including potassium, magnesium and phosphorus.  Seen and followed by gastroenterology.  She had more than 3 L output from her ileostomy.  Also found to have norovirus infection.  With aggressive treatment her renal functions have normalized.  Electrolytes were low, replaced before discharge.  Last 24 hours her output is decreased to 300 mL and her stool has become thickened.  This has been a recurrent problem.  GI gradually introduced Imodium 2 mg 4 times a day, cholestyramine 2 times a day, Lomotil 2 times a day and Metamucil 2 times a day.  She was able to achieve better output today on above medications.  She was also able to keep up with oral nutrition and fluid intake.  She does have high output, however this might have been aggravated by norovirus infection.  She is already  improving.  Patient will be going home with a regimen of Imodium 2 mg 4 times a day, Lomotil 2 tablets 2 times a day, cholestyramine 3 times a day as well as Metamucil.  She may ultimately gradually improve and may not need this much medication support.  Actively followed by gastroenterology in the hospital.  She was discharged from [facility] outpatient office so unable to follow-up with them.  I sent a new referral to [facility] for follow-up.  She will also stay on magnesium supplements.  Adequately improved and able to go home today.  Will need repeat electrolytes including magnesium and phosphorus to be checked in 1 to 2 weeks.    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 Will refer you to GI doctor. Continue taking until seen in follow up. Schedule follow up with your primary care doctor  Increase activity slowly   Complete by: As directed       Allergies as of 08/20/2021       Reactions   Iodine Other (See Comments)   REACTION: in eye gtts Iodine eye drops;caused eyes to turn red;  pt has no reactions to CT contrast        Medication List     STOP taking these medications    esomeprazole 40 MG  capsule Commonly known as: NEXIUM   feeding supplement Liqd   polycarbophil 625 MG tablet Commonly known as: FIBERCON       TAKE these medications    acetaminophen 500 MG tablet Commonly known as: TYLENOL Take 1,000 mg by mouth every 6 (six) hours as needed for headache (pain).   cholestyramine 4 g packet Commonly known as: QUESTRAN Take 1 packet (4 g total) by mouth 3 (three) times daily.   diphenoxylate-atropine 2.5-0.025 MG tablet Commonly known as: LOMOTIL Take 2 tablets by mouth 2 (two) times daily.   ibuprofen 200 MG tablet Commonly known as: ADVIL Take 400 mg by mouth every 6 (six) hours as needed (pain).   loperamide 2 MG capsule Commonly known as: IMODIUM Take 1 capsule (2 mg total) by mouth 4 (four) times daily. What changed: when to take this   magnesium oxide 400 (240 Mg) MG tablet Commonly known as: MAG-OX Take 1 tablet (400 mg total) by mouth 2 (two) times daily.   midodrine 5 MG tablet Commonly known as: PROAMATINE Take 1 tablet (5 mg total) by mouth 3 (three) times daily with meals.   multivitamin with minerals Tabs tablet Take 1 tablet by mouth daily.   pramipexole 1 MG tablet Commonly known as: MIRAPEX Take 1 tablet (1 mg total) by mouth daily. What changed: when to take this   psyllium 95 % Pack Commonly known as: HYDROCIL/METAMUCIL Take 1 packet by mouth 2 (two) times daily.   venlafaxine XR 150 MG 24 hr capsule Commonly known as: EFFEXOR-XR Take 2 capsules (300 mg total) by mouth daily with breakfast.        Follow-up Information     Denita Lung, MD. Go on 08/27/2021.   Specialty: Family Medicine Why: please recheck CBC/BMP/ Mg/ Phos @11 :00am Contact information: [address] [place] [address] [phone]         Gastroenterology, [place]. Schedule an appointment as soon as possible for a visit.   Why: unable to be seen by [facility] GI. schedule follow up Contact information: [address] [place] [address] [phone]                Allergies  Allergen Reactions   Iodine Other (See Comments)    REACTION: in eye gtts Iodine eye drops;caused eyes to turn red;  pt has no reactions to CT contrast    Consultations: Gastroenterology   Procedures/Studies: CT ABDOMEN PELVIS WO CONTRAST  Result Date: 08/12/2021 CLINICAL DATA:  Disorientation, weakness, problems with ostomy EXAM: CT ABDOMEN AND PELVIS WITHOUT CONTRAST TECHNIQUE: Multidetector CT imaging of the abdomen and pelvis was performed following the standard protocol without IV contrast. COMPARISON:  07/10/2021 FINDINGS: Lower chest: No acute abnormality. Hepatobiliary: No solid liver abnormality is seen. Small gallstone in the gallbladder fundus. No gallbladder wall thickening, or biliary dilatation. Pancreas: Unremarkable. No pancreatic ductal dilatation or surrounding inflammatory changes. Spleen: Normal in size without significant abnormality. Adrenals/Urinary Tract: Adrenal glands are unremarkable. Kidneys are normal, without renal calculi, solid lesion, or hydronephrosis. Air-fluid level  within the urinary bladder. Stomach/Bowel: Stomach is within normal limits. 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 Vascular/Lymphatic: Scattered aortic atherosclerosis. No enlarged abdominal or pelvic lymph nodes. Reproductive: Status post hysterectomy. Other: No abdominal wall hernia or abnormality. No abdominopelvic ascites. Musculoskeletal: No acute or significant osseous findings. IMPRESSION: 1. 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 No evidence of bowel obstruction or other complication. 2. Air-fluid level within the urinary bladder. Correlate for recent catheterization. 3. Cholelithiasis without evidence of acute cholecystitis. Aortic Atherosclerosis (ICD10-I70.0). Electronically Signed    By: Delanna Ahmadi M.D.   On: 08/12/2021 18:49   CT ENTERO ABD/PELVIS W CONTAST  Result Date: 08/17/2021 CLINICAL DATA:  Inflammatory bowel disease EXAM: CT ABDOMEN AND PELVIS WITH CONTRAST (ENTEROGRAPHY) TECHNIQUE: Multidetector CT of the abdomen and pelvis during bolus administration of intravenous contrast. Negative oral contrast was given. CONTRAST:  44m OMNIPAQUE IOHEXOL 300 MG/ML SOLN, additional negative oral enteric contrast COMPARISON:  08/12/2021 FINDINGS: Lower chest: Trace pleural effusions. Hepatobiliary: No solid liver abnormality is seen. No gallstones, gallbladder wall thickening, or biliary dilatation. Pancreas: Unremarkable. No pancreatic ductal dilatation or surrounding inflammatory changes. Spleen: Normal in size without significant abnormality. Adrenals/Urinary Tract: Adrenal glands are unremarkable. Kidneys are normal, without renal calculi, solid lesion, or hydronephrosis. Bladder is unremarkable. Stomach/Bowel: Stomach is within normal limits. Status post ileocecal resection with end ileostomy. The colon is decompressed to the rectum. Limited distension of the small bowel; within this limitation, no acute inflammatory findings Vascular/Lymphatic: No significant vascular findings are present. No enlarged abdominal or pelvic lymph nodes. Reproductive: No mass or other significant abnormality. Other: Anasarca.  Small volume fluid in the pelvis. Musculoskeletal: No acute or significant osseous findings. IMPRESSION: 1. Status post ileocecal resection with end ileostomy. 2. Limited distension of the small bowel; within this limitation, no acute inflammatory findings. 3. Trace pleural effusions, small volume ascites and anasarca. Electronically Signed   By: ADelanna AhmadiM.D.   On: 08/17/2021 19:06   DG Chest Portable 1 View  Result Date: 08/12/2021 CLINICAL DATA:  Sepsis EXAM: PORTABLE CHEST 1 VIEW COMPARISON:  Chest x-ray 07/10/2021 FINDINGS: Interval removal of a right internal jugular  approach central venous catheter. The heart and mediastinal contours are unchanged. No focal consolidation. No pulmonary edema. No pleural effusion. No pneumothorax. No acute osseous abnormality. IMPRESSION: No active disease. Electronically Signed   By: MIven FinnM.D.   On: 08/12/2021 16:26   (Echo, Carotid, EGD, Colonoscopy, ERCP)    Subjective: Patient seen and examined.  Denies any complaints today.  Only changed her bag 2 times last 24 hours.   Discharge Exam: Vitals:   08/20/21 0308 08/20/21 1107  BP: 99/65 97/63  Pulse: 88 90  Resp: 16 16  Temp: 98.3 F (36.8 C) 98.6 F (37 C)  SpO2: 100% 100%   Vitals:   08/19/21 1107 08/19/21 2051 08/20/21 0308 08/20/21 1107  BP: (!) 91/58 101/64 99/65 97/63   Pulse: 89 78 88 90  Resp: 16 17 16 16   Temp: 99.1 F (37.3 C) 98.7 F (37.1 C) 98.3 F (36.8 C) 98.6 F (37 C)  TempSrc: Oral Oral Oral Oral  SpO2: 100% 100% 100% 100%  Weight:   54.3 kg   Height:        General: Pt is alert, awake, not in acute distress Cardiovascular: RRR, S1/S2 +, no rubs, no gallops Respiratory: CTA bilaterally, no wheezing, no rhonchi Abdominal: Soft,  NT, ND, bowel sounds + Patient has ileostomy right lower quadrant with loose stool, slightly thickened in quality today. Extremities: no edema, no cyanosis    The results of significant diagnostics from this hospitalization (including imaging, microbiology, ancillary and laboratory) are listed below for reference.     Microbiology: Recent Results (from the past 240 hour(s))  Culture, blood (Routine x 2)     Status: None   Collection Time: 08/12/21  3:30 PM   Specimen: BLOOD LEFT HAND  Result Value Ref Range Status   Specimen Description BLOOD LEFT HAND  Final   Special Requests   Final    BOTTLES DRAWN AEROBIC AND ANAEROBIC Blood Culture results may not be optimal due to an inadequate volume of blood received in culture bottles   Culture   Final    NO GROWTH 5 DAYS Performed at [facility], [place] [address] [place], [place] [address]    Report Status 08/17/2021 FINAL  Final  Culture, blood (Routine x 2)     Status: None   Collection Time: 08/12/21  3:43 PM   Specimen: BLOOD  Result Value Ref Range Status   Specimen Description BLOOD RIGHT ANTECUBITAL  Final   Special Requests   Final    BOTTLES DRAWN AEROBIC AND ANAEROBIC Blood Culture results may not be optimal due to an inadequate volume of blood received in culture bottles   Culture   Final    NO GROWTH 5 DAYS Performed at [facility], [place] [address], [place], [place] [address]    Report Status 08/17/2021 FINAL  Final  Resp Panel by RT-PCR (Flu A&B, Covid) Nasopharyngeal Swab     Status: None   Collection Time: 08/12/21  5:16 PM   Specimen: Nasopharyngeal Swab; Nasopharyngeal(NP) swabs in vial transport medium  Result Value Ref Range Status   SARS Coronavirus 2 by RT PCR NEGATIVE NEGATIVE Final    Comment: (NOTE) SARS-CoV-2 target nucleic acids are NOT DETECTED.  The SARS-CoV-2 RNA is generally detectable in upper respiratory specimens during the acute phase of infection. The lowest concentration of SARS-CoV-2 viral copies this assay can detect is 138 copies/mL. A negative result does not preclude SARS-Cov-2 infection and should not be used as the sole basis for treatment or other patient management decisions. A negative result may occur with  improper specimen collection/handling, submission of specimen other than nasopharyngeal swab, presence of viral mutation(s) within the areas targeted by this assay, and inadequate number of viral copies(<138 copies/mL). A negative result must be combined with clinical observations, patient history, and epidemiological information. The expected result is Negative.  Fact Sheet for Patients:  EntrepreneurPulse.com.au  Fact Sheet for Healthcare Providers:  IncredibleEmployment.be  This test is no t yet approved or cleared  by the [place] FDA and  has been authorized for detection and/or diagnosis of SARS-CoV-2 by FDA under an Emergency Use Authorization (EUA). This EUA will remain  in effect (meaning this test can be used) for the duration of the COVID-19 declaration under Section 564(b)(1) of the Act, 21 U.S.C.section 360bbb-3(b)(1), unless the authorization is terminated  or revoked sooner.       Influenza A by PCR NEGATIVE NEGATIVE Final   Influenza B by PCR NEGATIVE NEGATIVE Final    Comment: (NOTE) The Xpert Xpress SARS-CoV-2/FLU/RSV plus assay is intended as an aid in the diagnosis of influenza from Nasopharyngeal swab specimens and should not be used as a sole basis for treatment. Nasal washings and aspirates are unacceptable  for Xpert Xpress SARS-CoV-2/FLU/RSV testing.  Fact Sheet for Patients: EntrepreneurPulse.com.au  Fact Sheet for Healthcare Providers: IncredibleEmployment.be  This test is not yet approved or cleared by the [place] FDA and has been authorized for detection and/or diagnosis of SARS-CoV-2 by FDA under an Emergency Use Authorization (EUA). This EUA will remain in effect (meaning this test can be used) for the duration of the COVID-19 declaration under Section 564(b)(1) of the Act, 21 U.S.C. section 360bbb-3(b)(1), unless the authorization is terminated or revoked.  Performed at [facility], [place] [address], [place], [place] [address]   C Difficile Quick Screen (NO PCR Reflex)     Status: None   Collection Time: 08/17/21 11:52 AM   Specimen: STOOL  Result Value Ref Range Status   C Diff antigen NEGATIVE NEGATIVE Final   C Diff toxin NEGATIVE NEGATIVE Final   C Diff interpretation No C. difficile detected.  Final    Comment: Performed at [facility], [place] [address], [place], [place] [address]  Gastrointestinal Panel by PCR , Stool     Status: Abnormal   Collection Time: 08/17/21 11:53 AM   Specimen: STOOL   Result Value Ref Range Status   Campylobacter species NOT DETECTED NOT DETECTED Final   Plesimonas shigelloides NOT DETECTED NOT DETECTED Final   Salmonella species NOT DETECTED NOT DETECTED Final   Yersinia enterocolitica NOT DETECTED NOT DETECTED Final   Vibrio species NOT DETECTED NOT DETECTED Final   Vibrio cholerae NOT DETECTED NOT DETECTED Final   Enteroaggregative E coli (EAEC) NOT DETECTED NOT DETECTED Final   Enteropathogenic E coli (EPEC) NOT DETECTED NOT DETECTED Final   Enterotoxigenic E coli (ETEC) NOT DETECTED NOT DETECTED Final   Shiga like toxin producing E coli (STEC) NOT DETECTED NOT DETECTED Final   Shigella/Enteroinvasive E coli (EIEC) NOT DETECTED NOT DETECTED Final   Cryptosporidium NOT DETECTED NOT DETECTED Final   Cyclospora cayetanensis NOT DETECTED NOT DETECTED Final   Entamoeba histolytica NOT DETECTED NOT DETECTED Final   Giardia lamblia NOT DETECTED NOT DETECTED Final   Adenovirus F40/41 NOT DETECTED NOT DETECTED Final   Astrovirus NOT DETECTED NOT DETECTED Final   Norovirus GI/GII DETECTED (A) NOT DETECTED Final    Comment: RESULT CALLED TO, READ BACK BY AND VERIFIED WITH: KELLEY DUFFY 08/19/21 1001 AMK    Rotavirus A NOT DETECTED NOT DETECTED Final   Sapovirus (I, II, IV, and V) NOT DETECTED NOT DETECTED Final    Comment: Performed at [facility], [place] [place], [place] [address]     Labs: BNP (last 3 results) No results for input(s): BNP in the last 8760 hours. Basic Metabolic Panel: Recent Labs  Lab 08/16/21 0406 08/17/21 0329 08/18/21 0327 08/19/21 0336 08/20/21 0318  NA 132* 131* 133* 133* 132*  K 4.6 5.1 4.5 4.9 5.2*  CL 110 111 113* 113* 111  CO2 17* 15* 17* 17* 17*  GLUCOSE 58* 59* 71 64* 67*  BUN 25* 24* 17 20 24*  CREATININE 1.41* 1.26* 1.31* 1.27* 1.22*  CALCIUM 8.3* 8.0* 8.0* 8.1* 8.4*  MG 1.4* 2.0 1.4* 2.0 1.4*  PHOS 1.8* 1.8* 2.1* 2.5 1.7*   Liver Function Tests: No results for input(s): AST, ALT,  ALKPHOS, BILITOT, PROT, ALBUMIN in the last 168 hours. No results for input(s): LIPASE, AMYLASE in the last 168 hours. No results for input(s): AMMONIA in the last 168 hours. CBC: Recent Labs  Lab 08/16/21 0406 08/17/21 0329 08/18/21 0327 08/19/21 5027 08/20/21 7412  WBC 9.5 8.5 8.9 7.7 8.9  NEUTROABS 6.5 5.6 5.7 4.5 5.8  HGB 8.4* 7.9* 7.6* 7.4* 7.9*  HCT 25.8* 24.5* 23.1* 23.8* 25.1*  MCV 91.5 92.8 93.1 95.2 96.9  PLT 187 132* 158 162 204   Cardiac Enzymes: No results for input(s): CKTOTAL, CKMB, CKMBINDEX, TROPONINI in the last 168 hours. BNP: Invalid input(s): POCBNP CBG: No results for input(s): GLUCAP in the last 168 hours.  D-Dimer No results for input(s): DDIMER in the last 72 hours. Hgb A1c No results for input(s): HGBA1C in the last 72 hours. Lipid Profile No results for input(s): CHOL, HDL, LDLCALC, TRIG, CHOLHDL, LDLDIRECT in the last 72 hours. Thyroid function studies No results for input(s): TSH, T4TOTAL, T3FREE, THYROIDAB in the last 72 hours.  Invalid input(s): FREET3 Anemia work up Recent Labs    08/19/21 1030  FOLATE 9.0  FERRITIN 145  TIBC 315  IRON 64  RETICCTPCT 1.6   Urinalysis    Component Value Date/Time   COLORURINE YELLOW 08/12/2021 1510   APPEARANCEUR HAZY (A) 08/12/2021 1510   LABSPEC 1.013 08/12/2021 1510   LABSPEC 1,025 12/18/2020 1644   PHURINE 5.0 08/12/2021 1510   GLUCOSEU 50 (A) 08/12/2021 1510   HGBUR NEGATIVE 08/12/2021 [place] 08/12/2021 1510   BILIRUBINUR small (A) 12/18/2020 1644   BILIRUBINUR n 12/04/2011 1014   KETONESUR NEGATIVE 08/12/2021 [place] 08/12/2021 1510   UROBILINOGEN negative 12/04/2011 1014   NITRITE NEGATIVE 08/12/2021 1510   LEUKOCYTESUR NEGATIVE 08/12/2021 1510   Sepsis Labs Invalid input(s): PROCALCITONIN,  WBC,  LACTICIDVEN Microbiology Recent Results (from the past 240 hour(s))  Culture, blood (Routine x 2)     Status: None   Collection Time: 08/12/21   3:30 PM   Specimen: BLOOD LEFT HAND  Result Value Ref Range Status   Specimen Description BLOOD LEFT HAND  Final   Special Requests   Final    BOTTLES DRAWN AEROBIC AND ANAEROBIC Blood Culture results may not be optimal due to an inadequate volume of blood received in culture bottles   Culture   Final    NO GROWTH 5 DAYS Performed at [facility], [place] [address] [place], [place] [address]    Report Status 08/17/2021 FINAL  Final  Culture, blood (Routine x 2)     Status: None   Collection Time: 08/12/21  3:43 PM   Specimen: BLOOD  Result Value Ref Range Status   Specimen Description BLOOD RIGHT ANTECUBITAL  Final   Special Requests   Final    BOTTLES DRAWN AEROBIC AND ANAEROBIC Blood Culture results may not be optimal due to an inadequate volume of blood received in culture bottles   Culture   Final    NO GROWTH 5 DAYS Performed at [facility], [place] [address], [place], [place] [address]    Report Status 08/17/2021 FINAL  Final  Resp Panel by RT-PCR (Flu A&B, Covid) Nasopharyngeal Swab     Status: None   Collection Time: 08/12/21  5:16 PM   Specimen: Nasopharyngeal Swab; Nasopharyngeal(NP) swabs in vial transport medium  Result Value Ref Range Status   SARS Coronavirus 2 by RT PCR NEGATIVE NEGATIVE Final    Comment: (NOTE) SARS-CoV-2 target nucleic acids are NOT DETECTED.  The SARS-CoV-2 RNA is generally detectable in upper respiratory specimens during the acute phase of infection. The lowest concentration of SARS-CoV-2 viral copies this assay can detect is 138 copies/mL. A negative result does not preclude SARS-Cov-2 infection  and should not be used as the sole basis for treatment or other patient management decisions. A negative result may occur with  improper specimen collection/handling, submission of specimen other than nasopharyngeal swab, presence of viral mutation(s) within the areas targeted by this assay, and inadequate number of viral copies(<138  copies/mL). A negative result must be combined with clinical observations, patient history, and epidemiological information. The expected result is Negative.  Fact Sheet for Patients:  EntrepreneurPulse.com.au  Fact Sheet for Healthcare Providers:  IncredibleEmployment.be  This test is no t yet approved or cleared by the [place] FDA and  has been authorized for detection and/or diagnosis of SARS-CoV-2 by FDA under an Emergency Use Authorization (EUA). This EUA will remain  in effect (meaning this test can be used) for the duration of the COVID-19 declaration under Section 564(b)(1) of the Act, 21 U.S.C.section 360bbb-3(b)(1), unless the authorization is terminated  or revoked sooner.       Influenza A by PCR NEGATIVE NEGATIVE Final   Influenza B by PCR NEGATIVE NEGATIVE Final    Comment: (NOTE) The Xpert Xpress SARS-CoV-2/FLU/RSV plus assay is intended as an aid in the diagnosis of influenza from Nasopharyngeal swab specimens and should not be used as a sole basis for treatment. Nasal washings and aspirates are unacceptable for Xpert Xpress SARS-CoV-2/FLU/RSV testing.  Fact Sheet for Patients: EntrepreneurPulse.com.au  Fact Sheet for Healthcare Providers: IncredibleEmployment.be  This test is not yet approved or cleared by the [place] FDA and has been authorized for detection and/or diagnosis of SARS-CoV-2 by FDA under an Emergency Use Authorization (EUA). This EUA will remain in effect (meaning this test can be used) for the duration of the COVID-19 declaration under Section 564(b)(1) of the Act, 21 U.S.C. section 360bbb-3(b)(1), unless the authorization is terminated or revoked.  Performed at [facility], [place] [address], [place], [place] [address]   C Difficile Quick Screen (NO PCR Reflex)     Status: None   Collection Time: 08/17/21 11:52 AM   Specimen: STOOL  Result Value Ref  Range Status   C Diff antigen NEGATIVE NEGATIVE Final   C Diff toxin NEGATIVE NEGATIVE Final   C Diff interpretation No C. difficile detected.  Final    Comment: Performed at [facility], [place] [address], [place],  [address]  Gastrointestinal Panel by PCR , Stool     Status: Abnormal   Collection Time: 08/17/21 11:53 AM   Specimen: STOOL  Result Value Ref Range Status   Campylobacter species NOT DETECTED NOT DETECTED Final   Plesimonas shigelloides NOT DETECTED NOT DETECTED Final   Salmonella species NOT DETECTED NOT DETECTED Final   Yersinia enterocolitica NOT DETECTED NOT DETECTED Final   Vibrio species NOT DETECTED NOT DETECTED Final   Vibrio cholerae NOT DETECTED NOT DETECTED Final   Enteroaggregative E coli (EAEC) NOT DETECTED NOT DETECTED Final   Enteropathogenic E coli (EPEC) NOT DETECTED NOT DETECTED Final   Enterotoxigenic E coli (ETEC) NOT DETECTED NOT DETECTED Final   Shiga like toxin producing E coli (STEC) NOT DETECTED NOT DETECTED Final   Shigella/Enteroinvasive E coli (EIEC) NOT DETECTED NOT DETECTED Final   Cryptosporidium NOT DETECTED NOT DETECTED Final   Cyclospora cayetanensis NOT DETECTED NOT DETECTED Final   Entamoeba histolytica NOT DETECTED NOT DETECTED Final   Giardia lamblia NOT DETECTED NOT DETECTED Final   Adenovirus F40/41 NOT DETECTED NOT DETECTED Final   Astrovirus NOT DETECTED NOT DETECTED Final   Norovirus GI/GII DETECTED (A) NOT  DETECTED Final    Comment: RESULT CALLED TO, READ BACK BY AND VERIFIED WITH: KELLEY DUFFY 08/19/21 1001 AMK    Rotavirus A NOT DETECTED NOT DETECTED Final   Sapovirus (I, II, IV, and V) NOT DETECTED NOT DETECTED Final    Comment: Performed at [facility], [address], [place], [place] [address]     Time coordinating discharge: 45 minutes  SIGNED:   Barb Merino, MD  [facility] 08/20/2021, 1:22 PM

## 2021-08-21 ENCOUNTER — Telehealth: Payer: Self-pay

## 2021-08-21 LAB — VITAMIN A: Vitamin A (Retinoic Acid): 47.9 ug/dL (ref 22.0–69.5)

## 2021-08-21 NOTE — Telephone Encounter (Signed)
Transition Care Management Follow-up Telephone Call Date of discharge and from where: Cynthia [name] 08/20/21 How have you been since you were released from the hospital? [facility] Any questions or concerns? No  Items Reviewed: Did the pt receive and understand the discharge instructions provided? Yes  Medications obtained and verified? Yes  Other? No  Any new allergies since your discharge? No  Dietary orders reviewed? No Do you have support at home? No   Home Care and Equipment/Supplies: Were home health services ordered? no   Functional Questionnaire: (I = Independent and D = Dependent) ADLs: I  Bathing/Dressing- I  Meal Prep- I  Eating- I  Maintaining continence- I  Transferring/Ambulation- I  Managing Meds- I  Follow up appointments reviewed:  PCP Hospital f/u appt confirmed? Yes  Scheduled to see Cynthia [name] on 08/27/21 @ 10:15a.m.Marland Kitchen [facility] f/u appt confirmed? No   Are transportation arrangements needed? No  If their condition worsens, is the pt aware to call PCP or go to the Emergency Dept.? Yes Was the patient provided with contact information for the PCP's office or ED? Yes Was to pt encouraged to call back with questions or concerns? Yes

## 2021-08-27 ENCOUNTER — Inpatient Hospital Stay: Payer: 59 | Admitting: Family Medicine

## 2021-08-31 ENCOUNTER — Other Ambulatory Visit: Payer: Self-pay

## 2021-08-31 ENCOUNTER — Emergency Department (HOSPITAL_COMMUNITY)
Admission: EM | Admit: 2021-08-31 | Discharge: 2021-09-01 | Disposition: A | Discharge status: Home / Self Care | Payer: Medicaid Other | Attending: Emergency Medicine | Admitting: Emergency Medicine

## 2021-08-31 ENCOUNTER — Encounter (HOSPITAL_COMMUNITY): Payer: Self-pay

## 2021-08-31 DIAGNOSIS — Z79899 Other long term (current) drug therapy: Secondary | ICD-10-CM | POA: Diagnosis not present

## 2021-08-31 DIAGNOSIS — F17210 Nicotine dependence, cigarettes, uncomplicated: Secondary | ICD-10-CM | POA: Diagnosis not present

## 2021-08-31 DIAGNOSIS — R21 Rash and other nonspecific skin eruption: Secondary | ICD-10-CM | POA: Diagnosis present

## 2021-08-31 DIAGNOSIS — Z96659 Presence of unspecified artificial knee joint: Secondary | ICD-10-CM | POA: Insufficient documentation

## 2021-08-31 DIAGNOSIS — K9419 Other complications of enterostomy: Secondary | ICD-10-CM | POA: Insufficient documentation

## 2021-08-31 DIAGNOSIS — R198 Other specified symptoms and signs involving the digestive system and abdomen: Secondary | ICD-10-CM

## 2021-08-31 DIAGNOSIS — I10 Essential (primary) hypertension: Secondary | ICD-10-CM | POA: Diagnosis not present

## 2021-08-31 DIAGNOSIS — Z932 Ileostomy status: Secondary | ICD-10-CM

## 2021-08-31 DIAGNOSIS — Z8616 Personal history of COVID-19: Secondary | ICD-10-CM | POA: Insufficient documentation

## 2021-08-31 DIAGNOSIS — R238 Other skin changes: Secondary | ICD-10-CM

## 2021-08-31 LAB — BASIC METABOLIC PANEL
Anion gap: 8 (ref 5–15)
BUN: 30 mg/dL — ABNORMAL HIGH (ref 8–23)
CO2: 22 mmol/L (ref 22–32)
Calcium: 8.9 mg/dL (ref 8.9–10.3)
Chloride: 107 mmol/L (ref 98–111)
Creatinine, Ser: 1.95 mg/dL — ABNORMAL HIGH (ref 0.44–1.00)
GFR, Estimated: 29 mL/min — ABNORMAL LOW (ref >60)
Glucose, Bld: 84 mg/dL (ref 70–99)
Potassium: 3.8 mmol/L (ref 3.5–5.1)
Sodium: 137 mmol/L (ref 135–145)

## 2021-08-31 LAB — CBC WITH DIFFERENTIAL/PLATELET
Abs Immature Granulocytes: 0.04 10*3/uL (ref 0.00–0.07)
Basophils Absolute: 0 10*3/uL (ref 0.0–0.1)
Basophils Relative: 0 %
Eosinophils Absolute: 0.2 10*3/uL (ref 0.0–0.5)
Eosinophils Relative: 3 %
HCT: 26.3 % — ABNORMAL LOW (ref 36.0–46.0)
Hemoglobin: 8.2 g/dL — ABNORMAL LOW (ref 12.0–15.0)
Immature Granulocytes: 1 %
Lymphocytes Relative: 22 %
Lymphs Abs: 1.9 10*3/uL (ref 0.7–4.0)
MCH: 30.3 pg (ref 26.0–34.0)
MCHC: 31.2 g/dL (ref 30.0–36.0)
MCV: 97 fL (ref 80.0–100.0)
Monocytes Absolute: 0.7 10*3/uL (ref 0.1–1.0)
Monocytes Relative: 8 %
Neutro Abs: 5.7 10*3/uL (ref 1.7–7.7)
Neutrophils Relative %: 66 %
Platelets: 480 10*3/uL — ABNORMAL HIGH (ref 150–400)
RBC: 2.71 MIL/uL — ABNORMAL LOW (ref 3.87–5.11)
RDW: 14.8 % (ref 11.5–15.5)
WBC: 8.5 10*3/uL (ref 4.0–10.5)
nRBC: 0 % (ref 0.0–0.2)

## 2021-08-31 MED ORDER — FENTANYL CITRATE PF 50 MCG/ML IJ SOSY
25.0000 ug | PREFILLED_SYRINGE | Freq: Once | INTRAMUSCULAR | Status: AC
  Administered 2021-08-31: 25 ug via INTRAVENOUS
  Filled 2021-08-31: qty 1

## 2021-08-31 MED ORDER — FLUCONAZOLE 200 MG PO TABS: 200.0000 mg | ORAL_TABLET | Freq: Every day | ORAL | 0 refills | Status: AC

## 2021-08-31 MED ORDER — SODIUM CHLORIDE 0.9 % IV BOLUS
1000.0000 mL | Freq: Once | INTRAVENOUS | Status: AC
  Administered 2021-08-31: 1000 mL via INTRAVENOUS

## 2021-08-31 MED ORDER — LOPERAMIDE HCL 2 MG PO CAPS: 2.0000 mg | ORAL_CAPSULE | ORAL | 0 refills | Status: DC | PRN

## 2021-08-31 MED ORDER — NYSTATIN 100000 UNIT/GM EX POWD
Freq: Once | CUTANEOUS | Status: AC
  Administered 2021-08-31: 1 via TOPICAL
  Filled 2021-08-31: qty 15

## 2021-08-31 MED ORDER — OXYCODONE-ACETAMINOPHEN 5-325 MG PO TABS: 1.0000 | ORAL_TABLET | Freq: Once | ORAL | Status: DC

## 2021-08-31 NOTE — Discharge Instructions (Signed)
Take the Diflucan to help with the skin irritation. Use the supplies given with your ostomy bag. Follow-up with the ostomy clinic listed below as soon as possible. Return to the ER for severe pain, fever, chest pain or shortness of breath.

## 2021-08-31 NOTE — ED Provider Notes (Signed)
[facility] DEPT Provider Note   CSN: [id number] Arrival date & time: 08/31/21  1621     History Chief Complaint  Patient presents with   ostomy issue    Cynthia [name] is a 60 y.o. female with a past medical history of Crohn's status post ileostomy placement in April 2022 presenting to the ED with a chief complaint of increased ostomy output.  This occurred several hours ago today.  States that the bag was unable to attach to her abdomen due to the increased output.  Reports surrounding skin irritation and "burning sensation" due to the increased output.  She is unsure if this is related to something that she ate but she cannot pinpoint what.  Denies any vomiting.  Reports chills.  Denies any urinary symptoms.  HPI     Past Medical History:  Diagnosis Date   Anemia 2012   Anxiety    Bronchitis    Crohn's ileitis ([place])    Depression    GERD (gastroesophageal reflux disease)    Headache(784.0)    MIGRAINE   Heart murmur    Hiatal hernia    Hypertension    IBS (irritable bowel syndrome)    Incontinence    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HCC)    MVP (mitral valve prolapse)    RLS (restless legs syndrome)    Tubular adenoma of colon 08/2019    Patient Active Problem List   Diagnosis Date Noted   Acute renal failure (ARF) ([place]) 08/12/2021   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[place])    Protein-calorie malnutrition, severe 07/11/2021   Hypotension 07/10/2021   High anion gap metabolic acidosis 18/29/9371   Acute renal failure, unspecified acute renal failure type ([place]) 07/10/2021   Shock circulatory (HCC)    Nausea and vomiting 06/26/2021   Abdominal pain 06/26/2021   Sepsis ([place]) 06/26/2021   Sepsis due to cellulitis ([place]) 04/05/2021   AKI (acute kidney injury) ([place]) 04/05/2021   Hyperkalemia 04/05/2021   Hyponatremia 04/05/2021   Adjustment disorder 03/14/2021   Unspecified severe protein-calorie malnutrition ([place]) 03/13/2021   COVID-19 01/25/2021    Leukocytosis 01/24/2021   Crohn's colitis, with intestinal obstruction ([place]) 01/24/2021   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   History of rectal polyp 2020 01/12/2021   Anxiety associated with depression 01/12/2021   Hypokalemia 12/04/2020   Cough 11/27/2020   Otalgia of both ears 11/27/2020   Body aches 11/27/2020   Fever 11/27/2020   High risk medication use 11/27/2020   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[place]) 06/27/2020   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   Crohn's ileitis, with intestinal obstruction ([place]) 10/05/2019   Colitis    Rectal polyp    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   RLS (restless legs syndrome) 09/25/2014   Current smoker 09/19/2011   Migraine headache 09/19/2011   Essential hypertension 09/19/2011   Obesity (BMI 30-39.9) 09/19/2011   History of serrated colonic polyp 01/13/2011   Gastroesophageal reflux disease 03/17/2008   MVP (mitral valve prolapse) 03/17/2008   HIATAL HERNIA 05/03/2002    Past Surgical History:  Procedure Laterality Date   ABDOMINAL HYSTERECTOMY  06/17/2011   Procedure: HYSTERECTOMY ABDOMINAL;  Surgeon: Arloa Koh;  Location: [facility];  Service: Gynecology;  Laterality: N/A;  Converted to Abdominal Hysterectomy with lysis of adhesions    ANTERIOR AND POSTERIOR REPAIR  02/25/2012   Procedure: ANTERIOR (CYSTOCELE) AND POSTERIOR REPAIR (RECTOCELE);  Surgeon: Sharyn Lull  Jinny Sanders, MD;  Location: [facility];  Service: Gynecology;  Laterality: N/A;  ANterior cystocele repair ONLY   BIOPSY  08/29/2019   Procedure: BIOPSY;  Surgeon: Lavena Bullion, DO;  Location: [facility];  Service: Gastroenterology;;   BLADDER SUSPENSION  02/25/2012   Procedure: TRANSVAGINAL TAPE (TVT) PROCEDURE;  Surgeon: Daria Pastures, MD;  Location: [facility];  Service: Gynecology;  Laterality: N/A;   COLONOSCOPY WITH PROPOFOL N/A 08/29/2019    Procedure: COLONOSCOPY WITH PROPOFOL;  Surgeon: Lavena Bullion, DO;  Location: [facility];  Service: Gastroenterology;  Laterality: N/A;   CYSTOSCOPY  02/25/2012   Procedure: CYSTOSCOPY;  Surgeon: Daria Pastures, MD;  Location: [facility];  Service: Gynecology;  Laterality: N/A;   ILEOSTOMY     KNEE ARTHROSCOPY  2010   LAPAROSCOPIC ASSISTED VAGINAL HYSTERECTOMY  06/17/2011   Procedure: LAPAROSCOPIC ASSISTED VAGINAL HYSTERECTOMY;  Surgeon: Arloa Koh;  Location: [facility];  Service: Gynecology;  Laterality: N/A;  Attempted    POLYPECTOMY  08/29/2019   Procedure: POLYPECTOMY;  Surgeon: Lavena Bullion, DO;  Location: [facility];  Service: Gastroenterology;;   SALPINGOOPHORECTOMY  06/17/2011   Procedure: SALPINGO OOPHERECTOMY;  Surgeon: Arloa Koh;  Location: [facility];  Service: Gynecology;  Laterality: Bilateral;   TONSILLECTOMY       OB History   No obstetric history on file.     Family History  Problem Relation Age of Onset   Diabetes Mother    Dementia Mother    Clotting disorder Father    Macular degeneration Father    Crohn's disease Sister    Colon cancer Neg Hx    Esophageal cancer Neg Hx    Pancreatic cancer Neg Hx    Stomach cancer Neg Hx    Liver disease Neg Hx     Social History   Tobacco Use   Smoking status: Every Day    Packs/day: 0.00    Types: Cigarettes   Smokeless tobacco: Never   Tobacco comments:    none in 2 weeks as of 12/04/20  Vaping Use   Vaping Use: Some days  Substance Use Topics   Alcohol use: Not Currently   Drug use: Not Currently    Types: Cocaine    Comment: clean x 8 weeks    Home Medications Prior to Admission medications   Medication Sig Start Date End Date Taking? Authorizing Provider  fluconazole (DIFLUCAN) 200 MG tablet Take 1 tablet (200 mg total) by mouth daily for 7 days. 08/31/21 09/07/21 Yes Devona Holmes, PA-C  loperamide (IMODIUM) 2 MG capsule Take 1 capsule (2 mg total) by mouth as needed for diarrhea or loose  stools. 08/31/21  Yes Yong Grieser, PA-C  acetaminophen (TYLENOL) 500 MG tablet Take 1,000 mg by mouth every 6 (six) hours as needed for headache (pain).    [provider]  cholestyramine (QUESTRAN) 4 g packet Take 1 packet (4 g total) by mouth 3 (three) times daily. 08/20/21 09/19/21  Barb Merino, MD  diphenoxylate-atropine (LOMOTIL) 2.5-0.025 MG tablet Take 2 tablets by mouth 2 (two) times daily. 08/20/21   Barb Merino, MD  ibuprofen (ADVIL) 200 MG tablet Take 400 mg by mouth every 6 (six) hours as needed (pain).    [provider]  magnesium oxide (MAG-OX) 400 (240 Mg) MG tablet Take 1 tablet (400 mg total) by mouth 2 (two) times daily. 08/20/21 09/19/21  Barb Merino, MD  midodrine (PROAMATINE) 5 MG tablet Take 1 tablet (5 mg total) by mouth 3 (three)  times daily with meals. 08/20/21 09/19/21  Barb Merino, MD  Multiple Vitamin (MULTIVITAMIN WITH MINERALS) TABS tablet Take 1 tablet by mouth daily. 08/20/21 09/19/21  Barb Merino, MD  pramipexole (MIRAPEX) 1 MG tablet Take 1 tablet (1 mg total) by mouth daily. Patient taking differently: Take 1 mg by mouth at bedtime. 03/05/21   Denita Lung, MD  psyllium (HYDROCIL/METAMUCIL) 95 % PACK Take 1 packet by mouth 2 (two) times daily. 08/20/21 09/19/21  Barb Merino, MD  venlafaxine XR (EFFEXOR-XR) 150 MG 24 hr capsule Take 2 capsules (300 mg total) by mouth daily with breakfast. 03/05/21   Denita Lung, MD    Allergies    Iodine  Review of Systems   Review of Systems  Constitutional:  Negative for appetite change, chills and fever.  HENT:  Negative for ear pain, rhinorrhea, sneezing and sore throat.   Eyes:  Negative for photophobia and visual disturbance.  Respiratory:  Negative for cough, chest tightness, shortness of breath and wheezing.   Cardiovascular:  Negative for chest pain and palpitations.  Gastrointestinal:  Positive for diarrhea. Negative for abdominal pain, blood in stool, constipation, nausea  and vomiting.  Genitourinary:  Negative for dysuria, hematuria and urgency.  Musculoskeletal:  Negative for myalgias.  Skin:  Positive for rash.  Neurological:  Negative for dizziness, weakness and light-headedness.   Physical Exam Updated Vital Signs BP 99/65   Pulse 89   Temp 98.7 F (37.1 C) (Oral)   Resp 17   Ht 5' 4"  (1.626 m)   Wt 54.2 kg   LMP 06/10/2011 (Exact Date)   SpO2 100%   BMI 20.53 kg/m   Physical Exam Vitals and nursing note reviewed.  Constitutional:      General: She is not in acute distress.    Appearance: She is well-developed.  HENT:     Head: Normocephalic and atraumatic.     Nose: Nose normal.  Eyes:     General: No scleral icterus.       Right eye: No discharge.        Left eye: No discharge.     Conjunctiva/sclera: Conjunctivae normal.  Cardiovascular:     Rate and Rhythm: Normal rate and regular rhythm.     Heart sounds: Normal heart sounds. No murmur heard.   No friction rub. No gallop.  Pulmonary:     Effort: Pulmonary effort is normal. No respiratory distress.     Breath sounds: Normal breath sounds.  Abdominal:     General: Bowel sounds are normal. There is no distension.     Palpations: Abdomen is soft.     Tenderness: There is no abdominal tenderness. There is no guarding.  Musculoskeletal:        General: Normal range of motion.     Cervical back: Normal range of motion and neck supple.  Skin:    General: Skin is warm and dry.     Findings: Erythema (Of the abdomen surrounding the stoma) present. No rash.  Neurological:     Mental Status: She is alert.     Motor: No abnormal muscle tone.     Coordination: Coordination normal.     ED Results / Procedures / Treatments   Labs (all labs ordered are listed, but only abnormal results are displayed) Labs Reviewed  BASIC METABOLIC PANEL - Abnormal; Notable for the following components:      Result Value   BUN 30 (*)    Creatinine, Ser 1.95 (*)    GFR,  Estimated 29 (*)    All  other components within normal limits  CBC WITH DIFFERENTIAL/PLATELET - Abnormal; Notable for the following components:   RBC 2.71 (*)    Hemoglobin 8.2 (*)    HCT 26.3 (*)    Platelets 480 (*)    All other components within normal limits    EKG None  Radiology No results found.  Procedures Procedures   Medications Ordered in ED Medications  nystatin (MYCOSTATIN/NYSTOP) topical powder (1 application Topical Given 08/31/21 1924)  sodium chloride 0.9 % bolus 1,000 mL (1,000 mLs Intravenous New Bag/Given 08/31/21 2107)  fentaNYL (SUBLIMAZE) injection 25 mcg (25 mcg Intravenous Given 08/31/21 2120)    ED Course  I have reviewed the triage vital signs and the nursing notes.  Pertinent labs & imaging results that were available during my care of the patient were reviewed by me and considered in my medical decision making (see chart for details).  Clinical Course as of 08/31/21 2248  Sat Aug 31, 2021  1737 Stomahesive; paste to act like barrier and then after stuck, put bag on it [HK]  22 Consulted Dr. Georgette Dover, general surgeon on-call.  He recommends obtaining "stomahesive" with the help of the OR team upstairs.  This will help the bag stick back on the abdomen and act as a barrier adhesive.  She will also need medication to help slow her diarrhea such as Imodium. [HK]  1610 I spoke to OR nurse and she will come down and see the patient. [HK]  [place] Dr. Barry Dienes.  She looks at the image and recommends similar ostomy care and placing nystatin powder around the area as this appears related to use.  Also recommends Diflucan prior to discharge. [HK]  1818 Nystatin powder and 1 dose of diflucan [HK]  1900 OR does not have the barrier stomahesive, but Network engineer is trying to get in touch with any department that has it. [HK]  1917 WBC: 8.5 [HK]  1917 Hemoglobin(!): 8.2 Around her baseline [HK]  2042 Ostomy bag came off but no excessive output seen. Will get in touch with OR  regarding their help to replace bag. [HK]  2142 OR nurse is unable to come to the ED. I have asked secretary to call the OR at [facility] to see if they have "stomahesive" as I believe this would be the most beneficial for the patient in this situation. [HK]  2143 Patient's pain is improved with fentanyl and she is receiving IV fluids. [HK]  2143 We have replaced bag with the supplies that are available in this ER with success. [HK]    Clinical Course User Index [HK] Delia Heady, PA-C   MDM Rules/Calculators/A&P                           60 year old female presenting to the ED with a chief complaint of increased ostomy output.  She noticed this earlier today.  She has irritation of her skin surrounding the stoma site due to the increased output.  This is noted in the image above.  She describes it as a burning sensation.  Her vital signs are within normal limits.  Will check lab work and try to obtain adequate control of her output enough to place her ostomy bag on.  Lab work shows slight elevation in creatinine she was given IV fluids for this.  Her hemoglobin is at her baseline.  After several hours we were finally able to reattach  her ostomy bag adequately.  I stressed to her the importance of following up with the ostomy clinic due to to the amount of skin irritation in the area as well as receiving appropriate supplies.  We have given her some supplies to help with changing the bag at home before she is able to follow-up.  I will give Imodium and Diflucan as advised by general surgery team.  She remains hemodynamically stable here.  Return precautions given.   Patient is hemodynamically stable, in NAD, and able to ambulate in the ED. Evaluation does not show pathology that would require ongoing emergent intervention or inpatient treatment. I explained the diagnosis to the patient. Pain has been managed and has no complaints prior to discharge. Patient is comfortable with above plan and is  stable for discharge at this time. All questions were answered prior to disposition. Strict return precautions for returning to the ED were discussed. Encouraged follow up with PCP.   An After Visit Summary was printed and given to the patient.   Portions of this note were generated with Lobbyist. Dictation errors may occur despite best attempts at proofreading.  Final Clinical Impression(s) / ED Diagnoses Final diagnoses:  Increased ileostomy output ([place])  Skin irritation    Rx / DC Orders ED Discharge Orders          Ordered    loperamide (IMODIUM) 2 MG capsule  As needed        08/31/21 2237    fluconazole (DIFLUCAN) 200 MG tablet  Daily        08/31/21 2237             Delia Heady, PA-C 08/31/21 2254    Luna Fuse, MD 09/04/21 [phone]

## 2021-08-31 NOTE — ED Triage Notes (Signed)
Per EMS- patient c/o a watery stool from her colostomy. Patient states she can not get her ostomy bags to hold. Patient has had her ostomy bags off x 2 days and has  had stool on her skin x 2 days. Patient's skin on abdomen is "fiery red."

## 2021-08-31 NOTE — ED Notes (Signed)
Taxi called for patient.

## 2021-08-31 NOTE — ED Notes (Signed)
New ostomy bag placed on patient.

## 2021-08-31 NOTE — ED Provider Notes (Addendum)
Emergency Medicine Provider Triage Evaluation Note  Cynthia [name] , a 60 y.o. female  was evaluated in triage.  Pt complains of ostomy bag issues.  She reports 4 times normal output.  The ostomy bag broke at home, she is having erythematous changes to her abdomen which are extensively painful.  She has had a subjective fever and happened yesterday..  Review of Systems  Positive: Increased ostomy output, fever Negative:   Physical Exam  BP 117/72 (BP Location: Left Arm)   Pulse 90   Temp 98.7 F (37.1 C) (Oral)   Resp 18   LMP 06/10/2011 (Exact Date)   SpO2 100% Comment: Simultaneous filing. User may not have seen previous data. Gen:   Awake, no distress   Resp:  Normal effort  MSK:   Moves extremities without difficulty  Other:      Medical Decision Making  Medically screening exam initiated at 4:39 PM.  Appropriate orders placed.  Cynthia [name] was informed that the remainder of the evaluation will be completed by another provider, this initial triage assessment does not replace that evaluation, and the importance of remaining in the ED until their evaluation is complete.  Will get basic labs, patient does not appear septic.   Sherrill Raring, PA-C 08/31/21 1640    Sherrill Raring, PA-C 08/31/21 1646    Carmin Muskrat, MD 08/31/21 Cynthia [name]

## 2021-08-31 NOTE — ED Notes (Signed)
USIV attempted without success. Labs drawn.

## 2021-08-31 NOTE — ED Notes (Signed)
RN from [place] came down to assist with ostomy. Skin is too irritated to place bag.

## 2021-08-31 NOTE — ED Notes (Addendum)
Another ostomy bag applied on patient, barrier cream applied to skin.

## 2021-08-31 NOTE — ED Notes (Signed)
Patient has no money, no phone on her and cannot find a ride home. RN called patients daughter listed in the chart. She was unable to pick her up stating they no longer talk anymore. Patient unable to remember anyones phone number to come pick her up. Charge RN aware of situation.

## 2021-09-01 NOTE — ED Notes (Signed)
Patient said she is going to sue the hospital. Patient said "How can they kick you out like this?" New ostomy bag applied before discharge, barrier cream added to the redness around the ostomy site, as well as adhesive to help the ostomy bag stay in place. RN called patients daughter, who said they are unable to pick the patient up as she is being discharged. RN attempted to call patients friend Lennette Bihari, no answer. Charge RN, Clarise Cruz aware of situation. Patient ripped off her ostomy bag and applied the abdominal pads to her ostomy with tape multiple times. RN applied new bag 3 times, cleaned around the site gently. RN called the patient a taxi to get home. Patient still frustrated and yelling. RN asked patient if she had keys or a phone, patient said no. RN provided the patient with 3 sandwiches, 3 drinks, ostomy bag supplies and discharge paperwork with the instructions to contact the ostomy clinic.

## 2021-09-14 ENCOUNTER — Other Ambulatory Visit: Payer: Self-pay

## 2021-09-14 ENCOUNTER — Emergency Department (HOSPITAL_COMMUNITY)
Admission: EM | Admit: 2021-09-14 | Discharge: 2021-09-14 | Disposition: A | Discharge status: Home / Self Care | Payer: Medicaid Other | Attending: Emergency Medicine | Admitting: Emergency Medicine

## 2021-09-14 ENCOUNTER — Encounter (HOSPITAL_COMMUNITY): Payer: Self-pay | Admitting: *Deleted

## 2021-09-14 DIAGNOSIS — D692 Other nonthrombocytopenic purpura: Secondary | ICD-10-CM | POA: Diagnosis not present

## 2021-09-14 DIAGNOSIS — Y833 Surgical operation with formation of external stoma as the cause of abnormal reaction of the patient, or of later complication, without mention of misadventure at the time of the procedure: Secondary | ICD-10-CM | POA: Diagnosis not present

## 2021-09-14 DIAGNOSIS — R82998 Other abnormal findings in urine: Secondary | ICD-10-CM | POA: Diagnosis not present

## 2021-09-14 DIAGNOSIS — I10 Essential (primary) hypertension: Secondary | ICD-10-CM | POA: Diagnosis not present

## 2021-09-14 DIAGNOSIS — Z8616 Personal history of COVID-19: Secondary | ICD-10-CM | POA: Diagnosis not present

## 2021-09-14 DIAGNOSIS — F17210 Nicotine dependence, cigarettes, uncomplicated: Secondary | ICD-10-CM | POA: Insufficient documentation

## 2021-09-14 DIAGNOSIS — D72829 Elevated white blood cell count, unspecified: Secondary | ICD-10-CM | POA: Insufficient documentation

## 2021-09-14 DIAGNOSIS — R238 Other skin changes: Secondary | ICD-10-CM

## 2021-09-14 DIAGNOSIS — K9409 Other complications of colostomy: Secondary | ICD-10-CM | POA: Diagnosis not present

## 2021-09-14 DIAGNOSIS — Z939 Artificial opening status, unspecified: Secondary | ICD-10-CM

## 2021-09-14 LAB — URINALYSIS, ROUTINE W REFLEX MICROSCOPIC
Bilirubin Urine: NEGATIVE
Glucose, UA: NEGATIVE mg/dL
Hgb urine dipstick: NEGATIVE
Ketones, ur: NEGATIVE mg/dL
Nitrite: NEGATIVE
Protein, ur: NEGATIVE mg/dL
Specific Gravity, Urine: 1.014 (ref 1.005–1.030)
pH: 5 (ref 5.0–8.0)

## 2021-09-14 LAB — COMPREHENSIVE METABOLIC PANEL
ALT: 14 U/L (ref 0–44)
AST: 22 U/L (ref 15–41)
Albumin: 3.7 g/dL (ref 3.5–5.0)
Alkaline Phosphatase: 55 U/L (ref 38–126)
Anion gap: 13 (ref 5–15)
BUN: 43 mg/dL — ABNORMAL HIGH (ref 8–23)
CO2: 15 mmol/L — ABNORMAL LOW (ref 22–32)
Calcium: 9.1 mg/dL (ref 8.9–10.3)
Chloride: 106 mmol/L (ref 98–111)
Creatinine, Ser: 2.53 mg/dL — ABNORMAL HIGH (ref 0.44–1.00)
GFR, Estimated: 21 mL/min — ABNORMAL LOW (ref >60)
Glucose, Bld: 79 mg/dL (ref 70–99)
Potassium: 4.4 mmol/L (ref 3.5–5.1)
Sodium: 134 mmol/L — ABNORMAL LOW (ref 135–145)
Total Bilirubin: 0.6 mg/dL (ref 0.3–1.2)
Total Protein: 7.1 g/dL (ref 6.5–8.1)

## 2021-09-14 LAB — CBC WITH DIFFERENTIAL/PLATELET
Abs Immature Granulocytes: 0.08 10*3/uL — ABNORMAL HIGH (ref 0.00–0.07)
Basophils Absolute: 0 10*3/uL (ref 0.0–0.1)
Basophils Relative: 0 %
Eosinophils Absolute: 0.2 10*3/uL (ref 0.0–0.5)
Eosinophils Relative: 2 %
HCT: 35 % — ABNORMAL LOW (ref 36.0–46.0)
Hemoglobin: 10.8 g/dL — ABNORMAL LOW (ref 12.0–15.0)
Immature Granulocytes: 1 %
Lymphocytes Relative: 20 %
Lymphs Abs: 2.7 10*3/uL (ref 0.7–4.0)
MCH: 30.3 pg (ref 26.0–34.0)
MCHC: 30.9 g/dL (ref 30.0–36.0)
MCV: 98 fL (ref 80.0–100.0)
Monocytes Absolute: 0.7 10*3/uL (ref 0.1–1.0)
Monocytes Relative: 5 %
Neutro Abs: 9.6 10*3/uL — ABNORMAL HIGH (ref 1.7–7.7)
Neutrophils Relative %: 72 %
Platelets: 331 10*3/uL (ref 150–400)
RBC: 3.57 MIL/uL — ABNORMAL LOW (ref 3.87–5.11)
RDW: 14.1 % (ref 11.5–15.5)
WBC: 13.3 10*3/uL — ABNORMAL HIGH (ref 4.0–10.5)
nRBC: 0 % (ref 0.0–0.2)

## 2021-09-14 LAB — LIPASE, BLOOD: Lipase: 73 U/L — ABNORMAL HIGH (ref 11–51)

## 2021-09-14 MED ORDER — DESITIN 13 % EX CREA: 5.0000 g | TOPICAL_CREAM | Freq: Three times a day (TID) | CUTANEOUS | 0 refills | Status: AC | PRN

## 2021-09-14 MED ORDER — SODIUM CHLORIDE 0.9 % IV BOLUS
1000.0000 mL | Freq: Once | INTRAVENOUS | Status: AC
  Administered 2021-09-14: 1000 mL via INTRAVENOUS

## 2021-09-14 MED ORDER — ACETAMINOPHEN 325 MG PO TABS
650.0000 mg | ORAL_TABLET | Freq: Four times a day (QID) | ORAL | Status: DC | PRN
  Administered 2021-09-14: 650 mg via ORAL
  Filled 2021-09-14: qty 2

## 2021-09-14 MED ORDER — CEPHALEXIN 500 MG PO CAPS: 500.0000 mg | ORAL_CAPSULE | Freq: Two times a day (BID) | ORAL | 0 refills | Status: AC

## 2021-09-14 MED ORDER — MIDODRINE HCL 5 MG PO TABS
5.0000 mg | ORAL_TABLET | Freq: Once | ORAL | Status: AC
  Administered 2021-09-14: 5 mg via ORAL
  Filled 2021-09-14: qty 1

## 2021-09-14 MED ORDER — ONDANSETRON HCL 4 MG/2ML IJ SOLN
4.0000 mg | Freq: Once | INTRAMUSCULAR | Status: AC
  Administered 2021-09-14: 4 mg via INTRAVENOUS
  Filled 2021-09-14: qty 2

## 2021-09-14 MED ORDER — MORPHINE SULFATE (PF) 4 MG/ML IV SOLN
4.0000 mg | Freq: Once | INTRAVENOUS | Status: AC
Start: 2021-09-14 — End: 2021-09-14
  Administered 2021-09-14: 4 mg via INTRAVENOUS
  Filled 2021-09-14: qty 1

## 2021-09-14 MED ORDER — ZINC OXIDE 12.8 % EX OINT
1.0000 "application " | TOPICAL_OINTMENT | Freq: Two times a day (BID) | CUTANEOUS | Status: DC | PRN
  Filled 2021-09-14: qty 56.7

## 2021-09-14 MED ORDER — DIPHENHYDRAMINE HCL 50 MG/ML IJ SOLN
12.5000 mg | Freq: Once | INTRAMUSCULAR | Status: AC
  Administered 2021-09-14: 12.5 mg via INTRAVENOUS
  Filled 2021-09-14: qty 1

## 2021-09-14 NOTE — ED Notes (Signed)
Snacks and water provided to pt per MD okay

## 2021-09-14 NOTE — TOC Initial Note (Addendum)
Transition of Care [facility]) - Initial/Assessment Note    Patient Details  Name: Cynthia [name] MRN: [id number] Date of Birth: Jun 19, 1961  [facility] CM/SW Contact:    Verdell Carmine, RN Phone Number: 09/14/2021, 8:37 AM  Clinical Narrative:                  Patient had visited the ED just last night for same issue, at that time WBC we not elevated patient was discharged with Home Health follow up. Presents back to the ED with confusion, continued redness of ostomy site. WBC elevated. From previous notes is estranged from family. Will need home health to assist in monitoring site.,  however will be difficult to secure due to  patients insurance.    Alvis Lemmings declined [employer] declined     Patient Goals and CMS Choice        Expected Discharge Plan and Services     Discharge Planning Services: CM Consult   Living arrangements for the past 2 months: [place]                                      Prior Living Arrangements/Services Living arrangements for the past 2 months: [place] with:: Self Patient language and need for interpreter reviewed:: Yes        Need for Family Participation in Patient Care: Yes (Comment) Care giver support system in place?: No (comment)   Criminal Activity/Legal Involvement Pertinent to Current Situation/Hospitalization: No - Comment as needed  Activities of Daily Living      Permission Sought/Granted                  Emotional Assessment         Alcohol / Substance Use: Not Applicable Psych Involvement: No (comment)  Admission diagnosis:  sepsis Patient Active Problem List   Diagnosis Date Noted   Acute renal failure (ARF) ([place]) 08/12/2021   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[place])    Protein-calorie malnutrition, severe 07/11/2021   Hypotension 07/10/2021   High anion gap metabolic acidosis 46/50/3546   Acute renal failure, unspecified acute renal failure type ([place]) 07/10/2021   Shock  circulatory ([place])    Nausea and vomiting 06/26/2021   Abdominal pain 06/26/2021   Sepsis ([place]) 06/26/2021   Sepsis due to cellulitis (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   History of rectal polyp 2020 01/12/2021   Anxiety associated with depression 01/12/2021   Hypokalemia 12/04/2020   Cough 11/27/2020   Otalgia of both ears 11/27/2020   Body aches 11/27/2020   Fever 11/27/2020   High risk medication use 11/27/2020   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[place]) 06/27/2020   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   Crohn's ileitis, with intestinal obstruction ([place]) 10/05/2019   Colitis    Rectal polyp    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   RLS (restless legs syndrome) 09/25/2014   Current smoker 09/19/2011   Migraine headache 09/19/2011   Essential hypertension 09/19/2011   Obesity (BMI 30-39.9) 09/19/2011  History of serrated colonic polyp 01/13/2011   Gastroesophageal reflux disease 03/17/2008   MVP (mitral valve prolapse) 03/17/2008   HIATAL HERNIA 05/03/2002   PCP:  Denita Lung, MD Pharmacy:   [id number]: [phone]: [phone]    Social Determinants of Health (SDOH) Interventions    Readmission Risk Interventions Readmission Risk Prevention Plan 08/20/2021 04/09/2021  Transportation Screening Complete Complete  Medication Review ([place] Complete Complete  PCP or Specialist appointment within 3-5 days of  discharge Complete Complete  HRI or HCit[place]Complete Complete  SW Recovery Care/Counseling Consult Complete Complete  Palliative Care Screening Not Applicable Not AL[place] Applicable Not Applicable  Some recent data might be hidden

## 2021-09-14 NOTE — ED Notes (Signed)
Pt verbalized understanding of needing urine sample.

## 2021-09-14 NOTE — ED Triage Notes (Signed)
The pt arrived by ge,s from home she reports that she has had abd pain for 1-2 hours.  She has an ostomy that the paramedics thought was an abscess she has medium green colored drainage on her abd her abd is red  raw appearing skin she is not wearing a  bag for the ostomy to drain  the pt appears not to know what is happening

## 2021-09-14 NOTE — ED Provider Notes (Signed)
[facility] EMERGENCY DEPARTMENT Provider Note   CSN: [id number] Arrival date & time: 09/14/21  0441     History Chief Complaint  Patient presents with   Abdominal Pain    Cynthia [name] is a 60 y.o. female.  HPI     This is a 60 year old female with a history of Crohn's ileitis status post diverting colostomy and colectomy, hypertension, IBS, prior history of jejunal intussusception who presents with abdominal pain.  Reports onset of pain 1 to 2 hours prior to arrival.  She has noticed green stool coming from her ostomy.  She is also noted redness of the skin on the left side of her abdomen.  This is new.  No new lotions, detergents, soaps.  She rates her pain at 7 out of 10.  She is not taking anything for pain.  Denies nausea or vomiting.  She is not had any fevers.  Past Medical History:  Diagnosis Date   Anemia 2012   Anxiety    Bronchitis    Crohn's ileitis ([place])    Depression    GERD (gastroesophageal reflux disease)    Headache(784.0)    MIGRAINE   Heart murmur    Hiatal hernia    Hypertension    IBS (irritable bowel syndrome)    Incontinence    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HCC)    MVP (mitral valve prolapse)    RLS (restless legs syndrome)    Tubular adenoma of colon 08/2019    Patient Active Problem List   Diagnosis Date Noted   Acute renal failure (ARF) ([place]) 08/12/2021   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[place])    Protein-calorie malnutrition, severe 07/11/2021   Hypotension 07/10/2021   High anion gap metabolic acidosis 26/20/3559   Acute renal failure, unspecified acute renal failure type ([place]) 07/10/2021   Shock circulatory ([place])    Nausea and vomiting 06/26/2021   Abdominal pain 06/26/2021   Sepsis ([place]) 06/26/2021   Sepsis due to cellulitis ([place]) 04/05/2021   AKI (acute kidney injury) ([place]) 04/05/2021   Hyperkalemia 04/05/2021   Hyponatremia 04/05/2021   Adjustment disorder 03/14/2021   Unspecified severe protein-calorie malnutrition ([place])  03/13/2021   COVID-19 01/25/2021   Leukocytosis 01/24/2021   Crohn's colitis, with intestinal obstruction ([place]) 01/24/2021   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   History of rectal polyp 2020 01/12/2021   Anxiety associated with depression 01/12/2021   Hypokalemia 12/04/2020   Cough 11/27/2020   Otalgia of both ears 11/27/2020   Body aches 11/27/2020   Fever 11/27/2020   High risk medication use 11/27/2020   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[place]) 06/27/2020   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   Crohn's ileitis, with intestinal obstruction ([place]) 10/05/2019   Colitis    Rectal polyp    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   RLS (restless legs syndrome) 09/25/2014   Current smoker 09/19/2011   Migraine headache 09/19/2011   Essential hypertension 09/19/2011   Obesity (BMI 30-39.9) 09/19/2011   History of serrated colonic polyp 01/13/2011   Gastroesophageal reflux disease 03/17/2008   MVP (mitral valve prolapse) 03/17/2008   HIATAL HERNIA 05/03/2002    Past Surgical History:  Procedure Laterality Date   ABDOMINAL HYSTERECTOMY  06/17/2011   Procedure: HYSTERECTOMY ABDOMINAL;  Surgeon: Arloa Koh;  Location: [facility];  Service: Gynecology;  Laterality: N/A;  Converted to Abdominal Hysterectomy with lysis of adhesions    ANTERIOR AND  POSTERIOR REPAIR  02/25/2012   Procedure: ANTERIOR (CYSTOCELE) AND POSTERIOR REPAIR (RECTOCELE);  Surgeon: Daria Pastures, MD;  Location: [facility];  Service: Gynecology;  Laterality: N/A;  ANterior cystocele repair ONLY   BIOPSY  08/29/2019   Procedure: BIOPSY;  Surgeon: Lavena Bullion, DO;  Location: [facility];  Service: Gastroenterology;;   BLADDER SUSPENSION  02/25/2012   Procedure: TRANSVAGINAL TAPE (TVT) PROCEDURE;  Surgeon: Daria Pastures, MD;  Location: [facility];  Service: Gynecology;  Laterality: N/A;   COLONOSCOPY  WITH PROPOFOL N/A 08/29/2019   Procedure: COLONOSCOPY WITH PROPOFOL;  Surgeon: Lavena Bullion, DO;  Location: [facility];  Service: Gastroenterology;  Laterality: N/A;   CYSTOSCOPY  02/25/2012   Procedure: CYSTOSCOPY;  Surgeon: Daria Pastures, MD;  Location: [facility];  Service: Gynecology;  Laterality: N/A;   ILEOSTOMY     KNEE ARTHROSCOPY  2010   LAPAROSCOPIC ASSISTED VAGINAL HYSTERECTOMY  06/17/2011   Procedure: LAPAROSCOPIC ASSISTED VAGINAL HYSTERECTOMY;  Surgeon: Arloa Koh;  Location: [facility];  Service: Gynecology;  Laterality: N/A;  Attempted    POLYPECTOMY  08/29/2019   Procedure: POLYPECTOMY;  Surgeon: Lavena Bullion, DO;  Location: [facility];  Service: Gastroenterology;;   SALPINGOOPHORECTOMY  06/17/2011   Procedure: SALPINGO OOPHERECTOMY;  Surgeon: Arloa Koh;  Location: [facility];  Service: Gynecology;  Laterality: Bilateral;   TONSILLECTOMY       OB History   No obstetric history on file.     Family History  Problem Relation Age of Onset   Diabetes Mother    Dementia Mother    Clotting disorder Father    Macular degeneration Father    Crohn's disease Sister    Colon cancer Neg Hx    Esophageal cancer Neg Hx    Pancreatic cancer Neg Hx    Stomach cancer Neg Hx    Liver disease Neg Hx     Social History   Tobacco Use   Smoking status: Every Day    Packs/day: 0.00    Types: Cigarettes   Smokeless tobacco: Never   Tobacco comments:    none in 2 weeks as of 12/04/20  Vaping Use   Vaping Use: Some days  Substance Use Topics   Alcohol use: Not Currently   Drug use: Not Currently    Types: Cocaine    Comment: clean x 8 weeks    Home Medications Prior to Admission medications   Medication Sig Start Date End Date Taking? Authorizing Provider  acetaminophen (TYLENOL) 500 MG tablet Take 1,000 mg by mouth every 6 (six) hours as needed for headache (pain).    [provider]  cholestyramine (QUESTRAN) 4 g packet Take 1 packet (4 g total) by  mouth 3 (three) times daily. 08/20/21 09/19/21  Barb Merino, MD  diphenoxylate-atropine (LOMOTIL) 2.5-0.025 MG tablet Take 2 tablets by mouth 2 (two) times daily. 08/20/21   Barb Merino, MD  ibuprofen (ADVIL) 200 MG tablet Take 400 mg by mouth every 6 (six) hours as needed (pain).    [provider]  loperamide (IMODIUM) 2 MG capsule Take 1 capsule (2 mg total) by mouth as needed for diarrhea or loose stools. 08/31/21   Khatri, Hina, PA-C  magnesium oxide (MAG-OX) 400 (240 Mg) MG tablet Take 1 tablet (400 mg total) by mouth 2 (two) times daily. 08/20/21 09/19/21  Barb Merino, MD  midodrine (PROAMATINE) 5 MG tablet Take 1 tablet (5 mg total) by mouth 3 (three) times daily with meals. 08/20/21 09/19/21  Ghimire,  Dante Gang, MD  Multiple Vitamin (MULTIVITAMIN WITH MINERALS) TABS tablet Take 1 tablet by mouth daily. 08/20/21 09/19/21  Barb Merino, MD  pramipexole (MIRAPEX) 1 MG tablet Take 1 tablet (1 mg total) by mouth daily. Patient taking differently: Take 1 mg by mouth at bedtime. 03/05/21   Denita Lung, MD  psyllium (HYDROCIL/METAMUCIL) 95 % PACK Take 1 packet by mouth 2 (two) times daily. 08/20/21 09/19/21  Barb Merino, MD  venlafaxine XR (EFFEXOR-XR) 150 MG 24 hr capsule Take 2 capsules (300 mg total) by mouth daily with breakfast. 03/05/21   Denita Lung, MD    Allergies    Iodine  Review of Systems   Review of Systems  Constitutional:  Negative for fever.  Respiratory:  Negative for cough, chest tightness and shortness of breath.   Cardiovascular:  Negative for chest pain.  Gastrointestinal:  Positive for abdominal pain. Negative for blood in stool, nausea and vomiting.  Musculoskeletal:  Negative for back pain.  Skin:  Positive for color change. Negative for wound.  All other systems reviewed and are negative.  Physical Exam Updated Vital Signs BP 108/75 (BP Location: Right Arm)   Pulse 91   Temp 98.8 F (37.1 C) (Oral)   Resp 16   LMP 06/10/2011 (Exact  Date)   SpO2 100%   Physical Exam Vitals and nursing note reviewed.  Constitutional:      Comments: Chronically ill-appearing, thin  HENT:     Head: Normocephalic and atraumatic.  Eyes:     Pupils: Pupils are equal, round, and reactive to light.  Cardiovascular:     Rate and Rhythm: Normal rate and regular rhythm.     Heart sounds: Normal heart sounds.  Pulmonary:     Effort: Pulmonary effort is normal. No respiratory distress.     Breath sounds: No wheezing.  Abdominal:     General: Bowel sounds are normal.     Palpations: Abdomen is soft.     Tenderness: There is generalized abdominal tenderness.     Comments: Stoma right abdomen with green stool noted,  Musculoskeletal:     Cervical back: Neck supple.  Skin:    General: Skin is warm and dry.     Comments: Blanching erythema noted about the abdomen Multiple purpura and petechiae over the bilateral arms and legs  Neurological:     General: No focal deficit present.     Mental Status: She is alert and oriented to person, place, and time.  Psychiatric:        Mood and Affect: Mood normal.    ED Results / Procedures / Treatments   Labs (all labs ordered are listed, but only abnormal results are displayed) Labs Reviewed  CBC WITH DIFFERENTIAL/PLATELET - Abnormal; Notable for the following components:      Result Value   WBC 13.3 (*)    RBC 3.57 (*)    Hemoglobin 10.8 (*)    HCT 35.0 (*)    Neutro Abs 9.6 (*)    Abs Immature Granulocytes 0.08 (*)    All other components within normal limits  COMPREHENSIVE METABOLIC PANEL - Abnormal; Notable for the following components:   Sodium 134 (*)    CO2 15 (*)    BUN 43 (*)    Creatinine, Ser 2.53 (*)    GFR, Estimated 21 (*)    All other components within normal limits  LIPASE, BLOOD - Abnormal; Notable for the following components:   Lipase 73 (*)    All other components  within normal limits  URINALYSIS, ROUTINE W REFLEX MICROSCOPIC - Abnormal; Notable for the following  components:   APPearance HAZY (*)    Leukocytes,Ua TRACE (*)    Bacteria, UA RARE (*)    All other components within normal limits  URINE CULTURE    EKG EKG Interpretation  Date/Time:  Saturday September 14 2021 04:42:18 EST Ventricular Rate:  89 PR Interval:  114 QRS Duration: 94 QT Interval:  366 QTC Calculation: 446 R Axis:   -55 Text Interpretation: Sinus rhythm Borderline short PR interval Probable left atrial enlargement Left anterior fascicular block RSR' in V1 or V2, probably normal variant Confirmed by Thayer Jew [phone]) on 09/14/2021 4:44:24 AM  Radiology No results found.  Procedures Procedures   Medications Ordered in ED Medications  morphine 4 MG/ML injection 4 mg (has no administration in time range)  ondansetron (ZOFRAN) injection 4 mg (has no administration in time range)    ED Course  I have reviewed the triage vital signs and the nursing notes.  Pertinent labs & imaging results that were available during my care of the patient were reviewed by me and considered in my medical decision making (see chart for details).    MDM Rules/Calculators/A&P                           Patient presents with abdominal discomfort.  She is chronically ill-appearing but nontoxic.  Thin.  She has diffuse erythema across her abdomen and some green stool from her stoma.  Erythema appears consistent with skin breakdown.  Patient states she has been wearing a colostomy bag but I question whether this is skin breakdown related to leakage from the stoma as it appears similar to some skin breakdown at the stoma site.  Is not consistent with infection.  Labs obtained.  Patient was given pain and nausea medication.  She is afebrile.  She does have a slight leukocytosis to 13.3 with a left shift.  Slightly worsened AKI with a creatinine of 2.53.  She was given fluids.  Patient signed out to oncoming provider.  Reassessment after fluids.  Barrier cream was ordered for wound care.   Given her leukocytosis, would favor discharge with antibiotics to cover for early superinfection; however, I feel like wound care and prevention of further irritation is paramount.  Final Clinical Impression(s) / ED Diagnoses Final diagnoses:  Skin irritation  History of creation of ostomy [facility])    Rx / DC Orders ED Discharge Orders     None        Merryl Hacker, MD 09/14/21 2258

## 2021-09-14 NOTE — Discharge Instructions (Addendum)
Please follow up with PCP in the next few days regarding irritation to your abdomen. Return for any worsening or worrisome symptoms.

## 2021-09-14 NOTE — ED Notes (Signed)
Pt ambulatory with steady gait to restroom 

## 2021-09-14 NOTE — ED Provider Notes (Signed)
  Provider Note MRN:  [id number]  Arrival date & time: 09/14/21    ED Course and Medical Decision Making  Assumed care from Dr Dina Rich at shift change.  See not from prior team for complete details, in brief: Pt to ED with stool leaking from stoma, erythema across abdomen, pos skin breakdown from stool on abdomen.   Plan per prior physician UA, pos abx  Ostomy bag was placed, barrier cream applied to abdominal wall. She has mild leukocytosis, will treat for possible small area of cellulitis to abdominal wall.   SW consulted for home health needs, ostomy nurse. D/w SW. Face to face completed. Consult to wound clinic.   Pain improved, she is tolerating PO, she is able to ambulate, safe for discharge at this time  The patient improved significantly and was discharged in stable condition. Detailed discussions were had with the patient regarding current findings, and need for close f/u with PCP or on call doctor. The patient has been instructed to return immediately if the symptoms worsen in any way for re-evaluation. Patient verbalized understanding and is in agreement with current care plan. All questions answered prior to discharge.    Procedures  Final Clinical Impressions(s) / ED Diagnoses     ICD-10-CM   1. Skin irritation  R23.8     2. History of creation of ostomy [facility])  Z93.9       ED Discharge Orders          Ordered    cephALEXin (KEFLEX) 500 MG capsule  2 times daily        09/14/21 1114    Zinc Oxide (DESITIN) 13 % CREA  3 times daily PRN        09/14/21 1114              Discharge Instructions      Please follow up with PCP in the next few days regarding irritation to your abdomen. Return for any worsening or worrisome symptoms.           Jeanell Sparrow, DO 09/14/21 1603

## 2021-09-15 LAB — URINE CULTURE

## 2021-09-17 ENCOUNTER — Telehealth: Payer: Self-pay

## 2021-09-17 NOTE — Telephone Encounter (Signed)
Alma Friendly from [facility] called requesting you send order for pt to go to [facility] t# [phone] fax # [phone], states pt was not approved for Home Health.

## 2021-09-17 NOTE — Care Management (Signed)
Called PCP for order for patient to go to ostomy clinic, unable to procure Home Health that was ordered in [facility] patient but the phone is not in service

## 2021-09-18 ENCOUNTER — Emergency Department (HOSPITAL_COMMUNITY): Payer: Medicaid Other

## 2021-09-18 ENCOUNTER — Other Ambulatory Visit: Payer: Self-pay

## 2021-09-18 ENCOUNTER — Emergency Department (HOSPITAL_COMMUNITY)
Admission: EM | Admit: 2021-09-18 | Discharge: 2021-09-18 | Disposition: A | Discharge status: Home / Self Care | Payer: Medicaid Other | Attending: Emergency Medicine | Admitting: Emergency Medicine

## 2021-09-18 ENCOUNTER — Encounter (HOSPITAL_COMMUNITY): Payer: Self-pay

## 2021-09-18 DIAGNOSIS — L539 Erythematous condition, unspecified: Secondary | ICD-10-CM | POA: Insufficient documentation

## 2021-09-18 DIAGNOSIS — Z8616 Personal history of COVID-19: Secondary | ICD-10-CM | POA: Diagnosis not present

## 2021-09-18 DIAGNOSIS — R109 Unspecified abdominal pain: Secondary | ICD-10-CM | POA: Diagnosis present

## 2021-09-18 DIAGNOSIS — K219 Gastro-esophageal reflux disease without esophagitis: Secondary | ICD-10-CM | POA: Insufficient documentation

## 2021-09-18 DIAGNOSIS — I10 Essential (primary) hypertension: Secondary | ICD-10-CM | POA: Diagnosis not present

## 2021-09-18 DIAGNOSIS — T8149XA Infection following a procedure, other surgical site, initial encounter: Secondary | ICD-10-CM | POA: Diagnosis not present

## 2021-09-18 DIAGNOSIS — Z79899 Other long term (current) drug therapy: Secondary | ICD-10-CM | POA: Diagnosis not present

## 2021-09-18 DIAGNOSIS — F17210 Nicotine dependence, cigarettes, uncomplicated: Secondary | ICD-10-CM | POA: Diagnosis not present

## 2021-09-18 DIAGNOSIS — Z7189 Other specified counseling: Secondary | ICD-10-CM

## 2021-09-18 DIAGNOSIS — X58XXXA Exposure to other specified factors, initial encounter: Secondary | ICD-10-CM | POA: Diagnosis not present

## 2021-09-18 LAB — COMPREHENSIVE METABOLIC PANEL
ALT: 13 U/L (ref 0–44)
AST: 15 U/L (ref 15–41)
Albumin: 4.1 g/dL (ref 3.5–5.0)
Alkaline Phosphatase: 60 U/L (ref 38–126)
Anion gap: 11 (ref 5–15)
BUN: 30 mg/dL — ABNORMAL HIGH (ref 8–23)
CO2: 20 mmol/L — ABNORMAL LOW (ref 22–32)
Calcium: 9.5 mg/dL (ref 8.9–10.3)
Chloride: 107 mmol/L (ref 98–111)
Creatinine, Ser: 1.98 mg/dL — ABNORMAL HIGH (ref 0.44–1.00)
GFR, Estimated: 28 mL/min — ABNORMAL LOW (ref >60)
Glucose, Bld: 97 mg/dL (ref 70–99)
Potassium: 3.6 mmol/L (ref 3.5–5.1)
Sodium: 138 mmol/L (ref 135–145)
Total Bilirubin: 0.4 mg/dL (ref 0.3–1.2)
Total Protein: 7.7 g/dL (ref 6.5–8.1)

## 2021-09-18 LAB — CBC WITH DIFFERENTIAL/PLATELET
Abs Immature Granulocytes: 0.03 10*3/uL (ref 0.00–0.07)
Basophils Absolute: 0 10*3/uL (ref 0.0–0.1)
Basophils Relative: 0 %
Eosinophils Absolute: 0.1 10*3/uL (ref 0.0–0.5)
Eosinophils Relative: 1 %
HCT: 31.9 % — ABNORMAL LOW (ref 36.0–46.0)
Hemoglobin: 10.3 g/dL — ABNORMAL LOW (ref 12.0–15.0)
Immature Granulocytes: 0 %
Lymphocytes Relative: 23 %
Lymphs Abs: 2.8 10*3/uL (ref 0.7–4.0)
MCH: 30.1 pg (ref 26.0–34.0)
MCHC: 32.3 g/dL (ref 30.0–36.0)
MCV: 93.3 fL (ref 80.0–100.0)
Monocytes Absolute: 0.8 10*3/uL (ref 0.1–1.0)
Monocytes Relative: 7 %
Neutro Abs: 8.3 10*3/uL — ABNORMAL HIGH (ref 1.7–7.7)
Neutrophils Relative %: 69 %
Platelets: 368 10*3/uL (ref 150–400)
RBC: 3.42 MIL/uL — ABNORMAL LOW (ref 3.87–5.11)
RDW: 14 % (ref 11.5–15.5)
WBC: 12.1 10*3/uL — ABNORMAL HIGH (ref 4.0–10.5)
nRBC: 0 % (ref 0.0–0.2)

## 2021-09-18 LAB — I-STAT CHEM 8, ED
BUN: 28 mg/dL — ABNORMAL HIGH (ref 8–23)
Calcium, Ion: 1.23 mmol/L (ref 1.15–1.40)
Chloride: 106 mmol/L (ref 98–111)
Creatinine, Ser: 2.2 mg/dL — ABNORMAL HIGH (ref 0.44–1.00)
Glucose, Bld: 93 mg/dL (ref 70–99)
HCT: 33 % — ABNORMAL LOW (ref 36.0–46.0)
Hemoglobin: 11.2 g/dL — ABNORMAL LOW (ref 12.0–15.0)
Potassium: 3.6 mmol/L (ref 3.5–5.1)
Sodium: 139 mmol/L (ref 135–145)
TCO2: 20 mmol/L — ABNORMAL LOW (ref 22–32)

## 2021-09-18 LAB — MAGNESIUM: Magnesium: 1.8 mg/dL (ref 1.7–2.4)

## 2021-09-18 MED ORDER — MORPHINE SULFATE (PF) 4 MG/ML IV SOLN
4.0000 mg | Freq: Once | INTRAVENOUS | Status: AC
  Administered 2021-09-18: 4 mg via INTRAVENOUS
  Filled 2021-09-18: qty 1

## 2021-09-18 MED ORDER — SODIUM CHLORIDE 0.9 % IV BOLUS
1000.0000 mL | Freq: Once | INTRAVENOUS | Status: AC
  Administered 2021-09-18: 1000 mL via INTRAVENOUS

## 2021-09-18 NOTE — Discharge Instructions (Signed)
I referred you to ostomy clinic  Continue current meds.  Please finish taking your Keflex as prescribed last time  Stay hydrated.   See your doctor for follow-up  Return to ER if you have worse abdominal pain, abdominal redness, vomiting, fever, severe pain

## 2021-09-18 NOTE — ED Notes (Signed)
[facility] arrived at this time to transport pt. Pt stable at this time.

## 2021-09-18 NOTE — Telephone Encounter (Signed)
Called [facility] and recording states they are unexpectedly closed until 10/07/21.  Pt is currently admitted, sent message to RN

## 2021-09-18 NOTE — ED Provider Notes (Signed)
  Physical Exam  BP 123/82 (BP Location: Right Arm)   Pulse 97   Temp 98.8 F (37.1 C) (Oral)   Resp 20   Ht 5' 4"  (1.626 m)   Wt 54 kg   LMP 06/10/2011 (Exact Date)   SpO2 100%   BMI 20.43 kg/m   Physical Exam  ED Course/Procedures     Procedures  MDM  Care assumed at 3 pm.  Patient has increased output from her ostomy.  Today her ostomy became more protruding and she has severe pain.  Signout pending reassessment by ostomy nurse  4:24 PM I examined the patient and the ostomy was slightly more herniated.  I applied some sugar and salt and were able to reduce it back to normal position.  It is always protruding about an inch.  It is nice and pink.  Ostomy nurse able to put a new ostomy on it.  Patient recently had acute renal failure.  I check creatinine and was 1.9 which is baseline.  CT did not show an obstruction.  Patient does have some skin redness around the site likely from diarrhea.  Patient is already on Keflex.  Patient does not appear septic.  This point she is stable for discharge, will refer to ostomy clinic     Drenda Freeze, MD 09/18/21 1626

## 2021-09-18 NOTE — ED Triage Notes (Signed)
Pt c/o abd pain. Pt states "my stoma just came out". Pt's abdomen is red and tender to touch. Pt is not wearing ostomy bag upon arrival.

## 2021-09-18 NOTE — Consult Note (Addendum)
[facility] Nurse ostomy consult note Stoma type/location: RUQ ileostomy. Patient has had stoma since March, 2022. Surgery was in [place], [place]. Provider (EDP) has treated with sugar for prolapse he reports was 2 inches long. Provider at bedside. Patient seen in Hallway bed of ED. Stomal assessment/size: 1 and 3/8 inches, red, budded, lumen in center Peristomal assessment: red, denuded, irritant contact dermatitis  ICD-10 CM Codes for Irritant Dermatitis  L24B3 - Related to fecal or urinary stoma or fistula  Treatment options for stomal/peristomal skin:  dusting with stoma powder, moistening with water and allowing to dry. Pouching with flat pouch with skin barrier ring.  Convex pouch needed for next pouch change.  Supplies ordered.  Pouch is [name] # [id number] (5), skin barrier rings [name] [phone]), belt [name]  621), stoma powder is [name] #6. Output: yellow liquid effluent Ostomy pouching: 1pc.with skin barrier ring Education provided: None   WOC nursing team will not follow, but will remain available to this patient, the nursing and medical teams.  Please re-consult if needed. Thanks, Maudie Flakes, MSN, RN, [place], [facility]  Pager# [phone]

## 2021-09-18 NOTE — ED Provider Notes (Signed)
[facility] DEPT Provider Note   CSN: [id number] Arrival date & time: 09/18/21  1254     History Chief Complaint  Patient presents with   Abdominal Pain   Wound Check    Cynthia [name] is a 60 y.o. female.  HPI She presents for evaluation of difficulty with her ostomy care.  Her ostomy bag fell off about an hour ago, and she is worried that the ostomy is sticking out more than usual.  She has been here several times with ostomy problems recently, including; ostomy bag adherence problems, high output, and a rash of her abdomen.  Most recently she was here 4 days ago, at which she was started on cephalexin for abdominal wall cellulitis and given a prescription for Desitin cream.  Apparently social work was engaged and orders were given for home health with referral to wound care.  Patient states she has not seen wound care yet.  She denies other concurrent problems although states she is frustrated.  She began crying when talking about her current setting and problems.  She lives with a friend.  She came here by EMS today.  There are no other known active modifying factors.    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[place])    Protein-calorie malnutrition, severe 07/11/2021   Hypotension 07/10/2021   High anion gap metabolic acidosis 21/19/4174   Acute renal failure, unspecified acute renal failure type ([place]) 07/10/2021   Shock circulatory (HCC)     Nausea and vomiting 06/26/2021   Abdominal pain 06/26/2021   Sepsis ([place]) 06/26/2021   Sepsis due to cellulitis ([place]) 04/05/2021   AKI (acute kidney injury) ([place]) 04/05/2021   Hyperkalemia 04/05/2021   Hyponatremia 04/05/2021   Adjustment disorder 03/14/2021   Unspecified severe protein-calorie malnutrition ([place]) 03/13/2021   COVID-19 01/25/2021   Leukocytosis 01/24/2021   Crohn's colitis, with intestinal obstruction ([place]) 01/24/2021   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   History of rectal polyp 2020 01/12/2021   Anxiety associated with depression 01/12/2021   Hypokalemia 12/04/2020   Cough 11/27/2020   Otalgia of both ears 11/27/2020   Body aches 11/27/2020   Fever 11/27/2020   High risk medication use 11/27/2020   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[place]) 06/27/2020   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   Crohn's ileitis, with intestinal obstruction ([place]) 10/05/2019   Colitis    Rectal polyp    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   RLS (restless legs syndrome) 09/25/2014   Current smoker 09/19/2011   Migraine headache 09/19/2011   Essential hypertension 09/19/2011   Obesity (BMI 30-39.9) 09/19/2011   History of serrated colonic polyp 01/13/2011   Gastroesophageal reflux disease 03/17/2008   MVP (mitral valve prolapse) 03/17/2008   HIATAL HERNIA  05/03/2002    Past Surgical History:  Procedure Laterality Date   ABDOMINAL HYSTERECTOMY  06/17/2011   Procedure: HYSTERECTOMY ABDOMINAL;  Surgeon: Arloa Koh;  Location: [facility];  Service: Gynecology;  Laterality: N/A;  Converted to Abdominal Hysterectomy with lysis of adhesions    ANTERIOR AND POSTERIOR REPAIR  02/25/2012   Procedure: ANTERIOR (CYSTOCELE) AND POSTERIOR REPAIR (RECTOCELE);  Surgeon: Daria Pastures, MD;  Location: [facility];  Service: Gynecology;  Laterality: N/A;  ANterior cystocele repair ONLY    BIOPSY  08/29/2019   Procedure: BIOPSY;  Surgeon: Lavena Bullion, DO;  Location: [facility];  Service: Gastroenterology;;   BLADDER SUSPENSION  02/25/2012   Procedure: TRANSVAGINAL TAPE (TVT) PROCEDURE;  Surgeon: Daria Pastures, MD;  Location: [facility];  Service: Gynecology;  Laterality: N/A;   COLONOSCOPY WITH PROPOFOL N/A 08/29/2019   Procedure: COLONOSCOPY WITH PROPOFOL;  Surgeon: Lavena Bullion, DO;  Location: [facility];  Service: Gastroenterology;  Laterality: N/A;   CYSTOSCOPY  02/25/2012   Procedure: CYSTOSCOPY;  Surgeon: Daria Pastures, MD;  Location: [facility];  Service: Gynecology;  Laterality: N/A;   ILEOSTOMY     KNEE ARTHROSCOPY  2010   LAPAROSCOPIC ASSISTED VAGINAL HYSTERECTOMY  06/17/2011   Procedure: LAPAROSCOPIC ASSISTED VAGINAL HYSTERECTOMY;  Surgeon: Arloa Koh;  Location: [facility];  Service: Gynecology;  Laterality: N/A;  Attempted    POLYPECTOMY  08/29/2019   Procedure: POLYPECTOMY;  Surgeon: Lavena Bullion, DO;  Location: [facility];  Service: Gastroenterology;;   SALPINGOOPHORECTOMY  06/17/2011   Procedure: SALPINGO OOPHERECTOMY;  Surgeon: Arloa Koh;  Location: [facility];  Service: Gynecology;  Laterality: Bilateral;   TONSILLECTOMY       OB History   No obstetric history on file.     Family History  Problem Relation Age of Onset   Diabetes Mother    Dementia Mother    Clotting disorder Father    Macular degeneration Father    Crohn's disease Sister    Colon cancer Neg Hx    Esophageal cancer Neg Hx    Pancreatic cancer Neg Hx    Stomach cancer Neg Hx    Liver disease Neg Hx     Social History   Tobacco Use   Smoking status: Every Day    Packs/day: 0.00    Types: Cigarettes   Smokeless tobacco: Never   Tobacco comments:    none in 2 weeks as of 12/04/20  Vaping Use   Vaping Use: Some days  Substance Use Topics   Alcohol use: Not Currently   Drug use: Not Currently    Types: Cocaine    Comment: clean x 8 weeks    Home  Medications Prior to Admission medications   Medication Sig Start Date End Date Taking? Authorizing Provider  acetaminophen (TYLENOL) 500 MG tablet Take 1,000 mg by mouth every 6 (six) hours as needed for headache (pain).    [provider]  cephALEXin (KEFLEX) 500 MG capsule Take 1 capsule (500 mg total) by mouth 2 (two) times daily for 7 days. 09/14/21 09/21/21  Jeanell Sparrow, DO  cholestyramine Lucrezia Starch) 4 g packet Take 1 packet (4 g total) by mouth 3 (three) times daily. 08/20/21 09/19/21  Barb Merino, MD  diphenoxylate-atropine (LOMOTIL) 2.5-0.025 MG tablet Take 2 tablets by mouth 2 (two) times daily. 08/20/21   Barb Merino, MD  ibuprofen (ADVIL) 200 MG tablet Take 400 mg by mouth every 6 (six) hours as needed (pain).    [provider]  loperamide (IMODIUM) 2 MG capsule Take 1 capsule (2 mg total) by mouth as needed for diarrhea or loose stools. 08/31/21   Khatri, Hina, PA-C  magnesium oxide (MAG-OX) 400 (240 Mg) MG tablet Take 1 tablet (400 mg total) by mouth 2 (two) times daily. 08/20/21 09/19/21  Barb Merino, MD  midodrine (PROAMATINE) 5 MG tablet Take 1 tablet (5 mg total) by mouth 3 (three) times daily with meals. 08/20/21 09/19/21  Barb Merino, MD  Multiple Vitamin (MULTIVITAMIN WITH MINERALS) TABS tablet Take 1 tablet by mouth daily. 08/20/21 09/19/21  Barb Merino, MD  pramipexole (MIRAPEX) 1 MG tablet Take 1 tablet (1 mg total) by mouth daily. Patient taking differently: Take 1 mg by mouth at bedtime. 03/05/21   Denita Lung, MD  psyllium (HYDROCIL/METAMUCIL) 95 % PACK Take 1 packet by mouth 2 (two) times daily. 08/20/21 09/19/21  Barb Merino, MD  venlafaxine XR (EFFEXOR-XR) 150 MG 24 hr capsule Take 2 capsules (300 mg total) by mouth daily with breakfast. 03/05/21   Denita Lung, MD  Zinc Oxide (DESITIN) 13 % CREA Apply 5 g topically 3 (three) times daily as needed for up to 7 days. 09/14/21 09/21/21  Jeanell Sparrow, DO    Allergies     Iodine  Review of Systems   Review of Systems  All other systems reviewed and are negative.  Physical Exam Updated Vital Signs BP 123/82 (BP Location: Right Arm)   Pulse 97   Temp 98.8 F (37.1 C) (Oral)   Resp 20   Ht 5' 4"  (1.626 m)   Wt 54 kg   LMP 06/10/2011 (Exact Date)   SpO2 100%   BMI 20.43 kg/m   Physical Exam Vitals and nursing note reviewed.  Constitutional:      General: She is not in acute distress.    Appearance: She is well-developed. She is not ill-appearing, toxic-appearing or diaphoretic.  HENT:     Head: Normocephalic and atraumatic.     Right Ear: External ear normal.     Left Ear: External ear normal.  Eyes:     Conjunctiva/sclera: Conjunctivae normal.     Pupils: Pupils are equal, round, and reactive to light.  Neck:     Trachea: Phonation normal.  Cardiovascular:     Rate and Rhythm: Normal rate.  Pulmonary:     Effort: Pulmonary effort is normal.  Abdominal:     General: There is no distension.     Palpations: Abdomen is soft.     Comments: Ostomy in right upper quadrant is open, there is a mild prolapse of about 1.5 inches of the intestinal tissue.  There is brown stool leaking from the ostomy and lying on the abdominal wall.  There is mild abdominal wall erythema.  This appearance is overall similar to prior depictions by photography, within the last 2 weeks.  Musculoskeletal:        General: Normal range of motion.     Cervical back: Normal range of motion and neck supple.  Skin:    General: Skin is warm and dry.  Neurological:     Mental Status: She is alert and oriented to person, place, and time.     Cranial Nerves: No cranial nerve deficit.     Sensory: No sensory deficit.     Motor: No abnormal muscle tone.     Coordination: Coordination normal.  Psychiatric:        Behavior: Behavior normal.  Thought Content: Thought content normal.        Judgment: Judgment normal.     Comments: Tearful when talking about her  condition.    ED Results / Procedures / Treatments   Labs (all labs ordered are listed, but only abnormal results are displayed) Labs Reviewed - No data to display  EKG None  Radiology No results found.  Procedures Procedures   Medications Ordered in ED Medications - No data to display  ED Course  I have reviewed the triage vital signs and the nursing notes.  Pertinent labs & imaging results that were available during my care of the patient were reviewed by me and considered in my medical decision making (see chart for details).    MDM Rules/Calculators/A&P                            Patient Vitals for the past 24 hrs:  BP Temp Temp src Pulse Resp SpO2 Height Weight  09/18/21 1308 -- -- -- -- -- -- 5' 4"  (1.626 m) 54 kg  09/18/21 1304 123/82 98.8 F (37.1 C) Oral 97 20 100 % -- --   Medical Decision Making:  This patient is presenting for evaluation of problems with ostomy device, which does require a range of treatment options, and is a complaint that involves a moderate risk of morbidity and mortality. The differential diagnoses include ostomy prolapse, GI disorder, rash on abdomen. I decided to review old records, and in summary elderly female recently and recurrently assessed for similar problems. She is not toxic or uncomfortable.  I did not require additional historical information from anyone.   Critical Interventions- consult ostomy nurse for assistance with ostomy care   CRITICAL CARE- no Performed by: Daleen Bo  Nursing Notes Reviewed/ Care Coordinated Applicable Imaging Reviewed Interpretation of Laboratory Data incorporated into ED treatment   Care to Dr. Darl Householder to dosposition patient after Ostomy care consultation.    Final Clinical Impression(s) / ED Diagnoses Final diagnoses:  Abdominal pain, unspecified abdominal location  Ostomy nurse consultation    Rx / DC Orders ED Discharge Orders     None        Daleen Bo,  MD 09/21/21 1317

## 2021-09-18 NOTE — Progress Notes (Signed)
[facility] contacted for wound care at home.

## 2021-09-18 NOTE — Progress Notes (Signed)
CSW spoke with pt regarding her housing situation.  Pt invited a single female and her three children to move in several months ago because they were living at a hotel and she was trying to help.  The situation has been more difficult than pt thought, the children are very loud and the family is eating all of her food and it has reached a point where pt wants to ask them to leave.  Pt asking for advice on how to have this conversation with the mother.  CSW provided support as pt shared this story.  CSW encouraged her to give the family a set time to find another living situation and to write down dates when she does this.  Pt identified a cousin as a support who could assist her in this situation as needed.    Pt reports she does not have [facility] services in place.  CSW spoke with Stacie at [facility] who reports they cannot accept this referral due to staffing.  Pt reports she is not currently going to the wound care clinic.  CSW spoke with Zacarias Pontes RNCM Mariann Laster who will have daytime RNCM arrange wound care clinic appt for pt tomorrow and call pt with this appt.  CSW advised pt that this appt could be multiple weeks out-pt does have PCP that she can use if needed in the mean time. CSW also provided pt with contact information for [facility] to set up medicaid transportation and pt verbalized understanding.    Per [facility] RN, [facility] has been called for transportation home. Lurline Idol, MSW, LCSW 11/16/20226:31 PM

## 2021-09-19 NOTE — Care Management (Signed)
Received a message from [employer] at [facility], patients PCP. She states she called the wound clinic and their answering machine says they are closed until 12/4. Will attempt to call them again today to make appt for patient, she has been to the ED 3 times in the past week.

## 2021-09-24 ENCOUNTER — Emergency Department (HOSPITAL_COMMUNITY)
Admission: EM | Admit: 2021-09-24 | Discharge: 2021-09-25 | Disposition: A | Discharge status: Home / Self Care | Payer: Medicaid Other | Attending: Student | Admitting: Student

## 2021-09-24 ENCOUNTER — Encounter (HOSPITAL_COMMUNITY): Payer: Self-pay | Admitting: Student

## 2021-09-24 DIAGNOSIS — Z932 Ileostomy status: Secondary | ICD-10-CM

## 2021-09-24 DIAGNOSIS — R109 Unspecified abdominal pain: Secondary | ICD-10-CM | POA: Diagnosis not present

## 2021-09-24 DIAGNOSIS — F17210 Nicotine dependence, cigarettes, uncomplicated: Secondary | ICD-10-CM | POA: Diagnosis not present

## 2021-09-24 DIAGNOSIS — I10 Essential (primary) hypertension: Secondary | ICD-10-CM | POA: Insufficient documentation

## 2021-09-24 DIAGNOSIS — Z8616 Personal history of COVID-19: Secondary | ICD-10-CM | POA: Diagnosis not present

## 2021-09-24 DIAGNOSIS — K9419 Other complications of enterostomy: Secondary | ICD-10-CM | POA: Diagnosis present

## 2021-09-24 DIAGNOSIS — R198 Other specified symptoms and signs involving the digestive system and abdomen: Secondary | ICD-10-CM

## 2021-09-24 LAB — COMPREHENSIVE METABOLIC PANEL
ALT: 12 U/L (ref 0–44)
AST: 13 U/L — ABNORMAL LOW (ref 15–41)
Albumin: 3.4 g/dL — ABNORMAL LOW (ref 3.5–5.0)
Alkaline Phosphatase: 56 U/L (ref 38–126)
Anion gap: 5 (ref 5–15)
BUN: 62 mg/dL — ABNORMAL HIGH (ref 8–23)
CO2: 17 mmol/L — ABNORMAL LOW (ref 22–32)
Calcium: 8.1 mg/dL — ABNORMAL LOW (ref 8.9–10.3)
Chloride: 109 mmol/L (ref 98–111)
Creatinine, Ser: 2.55 mg/dL — ABNORMAL HIGH (ref 0.44–1.00)
GFR, Estimated: 21 mL/min — ABNORMAL LOW (ref >60)
Glucose, Bld: 110 mg/dL — ABNORMAL HIGH (ref 70–99)
Potassium: 4.7 mmol/L (ref 3.5–5.1)
Sodium: 131 mmol/L — ABNORMAL LOW (ref 135–145)
Total Bilirubin: 0.4 mg/dL (ref 0.3–1.2)
Total Protein: 6.4 g/dL — ABNORMAL LOW (ref 6.5–8.1)

## 2021-09-24 LAB — CBC WITH DIFFERENTIAL/PLATELET
Abs Immature Granulocytes: 0.05 10*3/uL (ref 0.00–0.07)
Basophils Absolute: 0 10*3/uL (ref 0.0–0.1)
Basophils Relative: 0 %
Eosinophils Absolute: 0.2 10*3/uL (ref 0.0–0.5)
Eosinophils Relative: 2 %
HCT: 28.4 % — ABNORMAL LOW (ref 36.0–46.0)
Hemoglobin: 9.3 g/dL — ABNORMAL LOW (ref 12.0–15.0)
Immature Granulocytes: 0 %
Lymphocytes Relative: 21 %
Lymphs Abs: 2.4 10*3/uL (ref 0.7–4.0)
MCH: 30.6 pg (ref 26.0–34.0)
MCHC: 32.7 g/dL (ref 30.0–36.0)
MCV: 93.4 fL (ref 80.0–100.0)
Monocytes Absolute: 0.7 10*3/uL (ref 0.1–1.0)
Monocytes Relative: 6 %
Neutro Abs: 8.1 10*3/uL — ABNORMAL HIGH (ref 1.7–7.7)
Neutrophils Relative %: 71 %
Platelets: 317 10*3/uL (ref 150–400)
RBC: 3.04 MIL/uL — ABNORMAL LOW (ref 3.87–5.11)
RDW: 13.5 % (ref 11.5–15.5)
WBC: 11.6 10*3/uL — ABNORMAL HIGH (ref 4.0–10.5)
nRBC: 0 % (ref 0.0–0.2)

## 2021-09-24 LAB — MAGNESIUM: Magnesium: 1.8 mg/dL (ref 1.7–2.4)

## 2021-09-24 MED ORDER — SODIUM CHLORIDE 0.9 % IV BOLUS
1000.0000 mL | Freq: Once | INTRAVENOUS | Status: AC
  Administered 2021-09-25: 1000 mL via INTRAVENOUS

## 2021-09-24 MED ORDER — FENTANYL CITRATE PF 50 MCG/ML IJ SOSY: 50.0000 ug | PREFILLED_SYRINGE | Freq: Once | INTRAMUSCULAR | Status: DC

## 2021-09-24 MED ORDER — FENTANYL CITRATE PF 50 MCG/ML IJ SOSY
PREFILLED_SYRINGE | INTRAMUSCULAR | Status: AC
  Filled 2021-09-24: qty 1

## 2021-09-24 MED ORDER — SODIUM CHLORIDE 0.9 % IV BOLUS
1000.0000 mL | Freq: Once | INTRAVENOUS | Status: AC
  Administered 2021-09-24: 1000 mL via INTRAVENOUS

## 2021-09-24 MED ORDER — HYDROMORPHONE HCL 1 MG/ML IJ SOLN
0.5000 mg | Freq: Once | INTRAMUSCULAR | Status: AC
  Administered 2021-09-24: 0.5 mg via INTRAVENOUS
  Filled 2021-09-24: qty 1

## 2021-09-24 NOTE — Telephone Encounter (Signed)
[facility] and they do have the referral from the hospital and have tried to call patient and have been unable to reach her, they will continue to try and reach her but they do not need anything else from [place]

## 2021-09-24 NOTE — ED Triage Notes (Signed)
Patient had ileostomy place din May from Crohns disease. Patient had had multiple episodes of prolapse. At 2030 tonight the patient was having severe pain. Patient received 59mg of fentanyl and 5058mof NS with EMS. Patient is from home.

## 2021-09-24 NOTE — ED Provider Notes (Signed)
[facility] DEPT Provider Note   CSN: [id number] Arrival date & time: 09/24/21  2218     History Chief Complaint  Patient presents with   Abdominal Pain    Cynthia [name] is a 60 y.o. female with a hx of crohn's disease, IBS, GERD, hypertension, and prior abdominal surgeries including ileostomy, hysterectomy, salpingoophorectomy who presents to the ED with complaints of increased ostomy output over the past few hours. Patient reports she has had problems with her ostomy since placement in May of this year including issues with prolapse, skin breakdown/irritation, and pain- seen in the ED for this in the past. Reports her ostomy is somewhat prolapsed at baseline. Tonight she started to have significant increased stool output- loose brown stool almost continuously, this has lead to increased pain of the skin irritation around the ostomy. Pain is constant, worse with palpation/stool output, no alleviating factors. Given fentanyl en route without change. She denies fever, nausea, vomiting, melena, hematochezia, or bright red blood in stool.     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[place])    Protein-calorie malnutrition, severe 07/11/2021   Hypotension 07/10/2021   High anion gap metabolic acidosis 71/16/5790   Acute renal failure, unspecified acute renal failure type ([place]) 07/10/2021   Shock circulatory (HCC)    Nausea and vomiting  06/26/2021   Abdominal pain 06/26/2021   Sepsis ([place]) 06/26/2021   Sepsis due to cellulitis ([place]) 04/05/2021   AKI (acute kidney injury) ([place]) 04/05/2021   Hyperkalemia 04/05/2021   Hyponatremia 04/05/2021   Adjustment disorder 03/14/2021   Unspecified severe protein-calorie malnutrition ([place]) 03/13/2021   COVID-19 01/25/2021   Leukocytosis 01/24/2021   Crohn's colitis, with intestinal obstruction ([place]) 01/24/2021   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   History of rectal polyp 2020 01/12/2021   Anxiety associated with depression 01/12/2021   Hypokalemia 12/04/2020   Cough 11/27/2020   Otalgia of both ears 11/27/2020   Body aches 11/27/2020   Fever 11/27/2020   High risk medication use 11/27/2020   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[place]) 06/27/2020   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   Crohn's ileitis, with intestinal obstruction ([place]) 10/05/2019   Colitis    Rectal polyp    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   RLS (restless legs syndrome) 09/25/2014   Current smoker 09/19/2011   Migraine headache 09/19/2011   Essential hypertension 09/19/2011   Obesity (BMI 30-39.9) 09/19/2011   History of serrated colonic polyp 01/13/2011   Gastroesophageal reflux disease 03/17/2008   MVP (mitral valve prolapse) 03/17/2008   HIATAL HERNIA 05/03/2002    Past Surgical History:  Procedure Laterality Date   ABDOMINAL HYSTERECTOMY  06/17/2011   Procedure: HYSTERECTOMY ABDOMINAL;  Surgeon: Arloa Koh;  Location: [facility];  Service: Gynecology;  Laterality: N/A;  Converted to Abdominal Hysterectomy with lysis of adhesions    ANTERIOR AND POSTERIOR REPAIR  02/25/2012   Procedure: ANTERIOR (CYSTOCELE) AND POSTERIOR REPAIR (RECTOCELE);  Surgeon: Daria Pastures, MD;  Location: [facility];  Service: Gynecology;  Laterality: N/A;  ANterior cystocele repair ONLY   BIOPSY   08/29/2019   Procedure: BIOPSY;  Surgeon: Lavena Bullion, DO;  Location: [facility];  Service: Gastroenterology;;   BLADDER SUSPENSION  02/25/2012   Procedure: TRANSVAGINAL TAPE (TVT) PROCEDURE;  Surgeon: Daria Pastures, MD;  Location: [facility];  Service: Gynecology;  Laterality: N/A;   COLONOSCOPY WITH PROPOFOL N/A 08/29/2019   Procedure: COLONOSCOPY WITH PROPOFOL;  Surgeon: Lavena Bullion, DO;  Location: [facility];  Service: Gastroenterology;  Laterality: N/A;   CYSTOSCOPY  02/25/2012   Procedure: CYSTOSCOPY;  Surgeon: Daria Pastures, MD;  Location: [facility];  Service: Gynecology;  Laterality: N/A;   ILEOSTOMY     KNEE ARTHROSCOPY  2010   LAPAROSCOPIC ASSISTED VAGINAL HYSTERECTOMY  06/17/2011   Procedure: LAPAROSCOPIC ASSISTED VAGINAL HYSTERECTOMY;  Surgeon: Arloa Koh;  Location: [facility];  Service: Gynecology;  Laterality: N/A;  Attempted    POLYPECTOMY  08/29/2019   Procedure: POLYPECTOMY;  Surgeon: Lavena Bullion, DO;  Location: [facility];  Service: Gastroenterology;;   SALPINGOOPHORECTOMY  06/17/2011   Procedure: SALPINGO OOPHERECTOMY;  Surgeon: Arloa Koh;  Location: [facility];  Service: Gynecology;  Laterality: Bilateral;   TONSILLECTOMY       OB History   No obstetric history on file.     Family History  Problem Relation Age of Onset   Diabetes Mother    Dementia Mother    Clotting disorder Father    Macular degeneration Father    Crohn's disease Sister    Colon cancer Neg Hx    Esophageal cancer Neg Hx    Pancreatic cancer Neg Hx    Stomach cancer Neg Hx    Liver disease Neg Hx     Social History   Tobacco Use   Smoking status: Every Day    Packs/day: 0.00    Types: Cigarettes   Smokeless tobacco: Never   Tobacco comments:    none in 2 weeks as of 12/04/20  Vaping Use   Vaping Use: Some days  Substance Use Topics   Alcohol use: Not Currently   Drug use: Not Currently    Types: Cocaine    Comment: clean x 8 weeks    Home  Medications Prior to Admission medications   Medication Sig Start Date End Date Taking? Authorizing Provider  acetaminophen (TYLENOL) 500 MG tablet Take 1,000 mg by mouth every 6 (six) hours as needed for headache or mild pain.    [provider]  cholestyramine (QUESTRAN) 4 g packet Take 1 packet (4 g total) by mouth 3 (three) times daily. Patient taking differently: Take 4 g by mouth 3 (three) times a week. 08/20/21 09/19/21  Barb Merino, MD  diphenoxylate-atropine (LOMOTIL) 2.5-0.025 MG tablet Take 2 tablets by mouth 2 (two) times daily. Patient not taking: No sig reported 08/20/21   Barb Merino, MD  ibuprofen (ADVIL) 200 MG tablet Take 400 mg by mouth every 6 (six) hours as needed for mild pain or headache.    [provider]  loperamide (IMODIUM) 2 MG capsule Take 1 capsule (2 mg total) by mouth as needed for diarrhea or loose stools. Patient not taking: Reported on 09/18/2021 08/31/21  Khatri, Hina, PA-C  pramipexole (MIRAPEX) 1 MG tablet Take 1 tablet (1 mg total) by mouth daily. Patient not taking: Reported on 09/18/2021 03/05/21   Denita Lung, MD  venlafaxine XR (EFFEXOR-XR) 150 MG 24 hr capsule Take 2 capsules (300 mg total) by mouth daily with breakfast. Patient not taking: Reported on 09/18/2021 03/05/21   Denita Lung, MD    Allergies    Iodine  Review of Systems   Review of Systems  Constitutional:  Negative for chills and fever.  Respiratory:  Negative for shortness of breath.   Cardiovascular:  Negative for chest pain.  Gastrointestinal:  Positive for abdominal pain (to skin surface near ostomy) and diarrhea. Negative for anal bleeding, blood in stool, nausea and vomiting.  Genitourinary:  Negative for dysuria.  Neurological:  Negative for syncope.  All other systems reviewed and are negative.  Physical Exam Updated Vital Signs LMP 06/10/2011 (Exact Date)   Physical Exam Vitals and nursing note reviewed.  Constitutional:       Appearance: She is well-developed. She is not toxic-appearing.  HENT:     Head: Normocephalic and atraumatic.  Eyes:     General:        Right eye: No discharge.        Left eye: No discharge.     Conjunctiva/sclera: Conjunctivae normal.  Cardiovascular:     Rate and Rhythm: Normal rate and regular rhythm.  Pulmonary:     Effort: Pulmonary effort is normal. No respiratory distress.     Breath sounds: Normal breath sounds. No wheezing, rhonchi or rales.  Abdominal:     Palpations: Abdomen is soft.     Tenderness: There is no guarding or rebound.     Comments: RUQ ostomy present with surrounding skin erythema/irritation that is moist, skin changes are localized to the RUQ, no streaking erythema, not hot to the touch. No induration or fluctuance. Tender to palpation to areas of skin irritation/breakdown, but no tenderness to deep palpation of the abdomen.. Fairly persistent ostomy output on exam- watery to paste consistency of brown stool. Ostomy site itself is pink and protrudes about an inch.   Musculoskeletal:     Cervical back: Neck supple.  Skin:    General: Skin is warm and dry.     Findings: No rash.  Neurological:     Mental Status: She is alert.     Comments: Clear speech.   Psychiatric:     Comments: Tearful.     ED Results / Procedures / Treatments   Labs (all labs ordered are listed, but only abnormal results are displayed) Labs Reviewed  CBC WITH DIFFERENTIAL/PLATELET - Abnormal; Notable for the following components:      Result Value   WBC 11.6 (*)    RBC 3.04 (*)    Hemoglobin 9.3 (*)    HCT 28.4 (*)    Neutro Abs 8.1 (*)    All other components within normal limits  COMPREHENSIVE METABOLIC PANEL - Abnormal; Notable for the following components:   Sodium 131 (*)    CO2 17 (*)    Glucose, Bld 110 (*)    BUN 62 (*)    Creatinine, Ser 2.55 (*)    Calcium 8.1 (*)    Total Protein 6.4 (*)    Albumin 3.4 (*)    AST 13 (*)    GFR, Estimated 21 (*)    All other  components within normal limits  MAGNESIUM    EKG None  Radiology No  results found.  Procedures Procedures   Medications Ordered in ED Medications - No data to display  ED Course  I have reviewed the triage vital signs and the nursing notes.  Pertinent labs & imaging results that were available during my care of the patient were reviewed by me and considered in my medical decision making (see chart for details).    MDM Rules/Calculators/A&P                          Patient presents to the ED with complaints of pain at ostomy site with increased stool output.  Tearful on assessment. Ostomy with brown stool output varying in consistency, ostomy itself is pink, protrudes about 1 inch, patient reports degree of protrusion of the site is baseline. She has some erythema and moist skin surrounding the ostomy- this overall appears significantly improved from prior photos from October of this year, it is well localized to the RUQ, it is not hot to the touch, indurated or fluctuance- do not suspect current bacterial infection. Will check basic labs, place new ostomy bag, and provide analgesics.   Additional history obtained:  Additional history obtained from chart review & nursing note review.  Multiple recent ED visits, TOC has been involved, trying to help establish with wound care, patient does have PCP .   Lab Tests:  I Ordered, reviewed, and interpreted labs, which included:  CBC: Mild anemia, to prior ranges on record. CMP: BUN and creatinine are elevated, has had similar creatinine on 09/14/21.  Mildly low bicarb, anion gap within normal limits.  Mild hypoalbuminemia present. Magnesium: Within normal limits.  ED Course:  Patient with elevated BUN and creatinine consistent with prerenal process, has had similar creatinines in the past, given 2 L of fluids in the emergency department for this, will need PCP recheck.  Her skin changes around her ostomy site overall appear improved  from prior photos from last month, do not suspect acute bacterial infection such as cellulitis, there is no fluctuance or purulent drainage to suggest abscess.  Her ostomy does not appear to be acutely prolapsed more than prior-patient is in agreement with this.  Following analgesics, cleansing, and new ostomy bag application patient is feeling much better.  Repeat abdominal exam remains without tenderness to deep palpation, only tender over the irritated skin, no peritoneal signs, I do not suspect acute surgical process, recent CT imaging at last visit reassuring.  Patient is tolerating p.o. in the emergency department without difficulty.  Patient overall appears appropriate for discharge home with close outpatient follow-up and strict ED return precautions. Will provide short course of analgesics to assist with pain. [place] Controlled Substance reporting System queried. I discussed results, treatment plan, need for follow-up, and return precautions with the patient. Provided opportunity for questions, patient confirmed understanding and is in agreement with plan.   This is a shared visit with supervising physician Dr. Matilde Sprang who has independently evaluated patient & provided guidance in evaluation/management/disposition, in agreement with care   Portions of this note were generated with Dragon dictation software. Dictation errors may occur despite best attempts at proofreading.  Final Clinical Impression(s) / ED Diagnoses Final diagnoses:  Increased ileostomy output ([place])    Rx / DC Orders ED Discharge Orders          Ordered    HYDROcodone-acetaminophen (NORCO/VICODIN) 5-325 MG tablet  Every 6 hours PRN        09/25/21 0157  Leafy Kindle 09/25/21 0235    Teressa Lower, MD 10/01/21 [phone]

## 2021-09-25 MED ORDER — HYDROCODONE-ACETAMINOPHEN 5-325 MG PO TABS: 1.0000 | ORAL_TABLET | Freq: Four times a day (QID) | ORAL | 0 refills | Status: DC | PRN

## 2021-09-25 MED ORDER — OXYCODONE-ACETAMINOPHEN 5-325 MG PO TABS
1.0000 | ORAL_TABLET | Freq: Once | ORAL | Status: AC
  Administered 2021-09-25: 1 via ORAL
  Filled 2021-09-25: qty 1

## 2021-09-25 NOTE — ED Notes (Signed)
Walked in to room. Colostomy bag was leaking around the edges. Old device removed. Patient was cleaned and linens were changed. Applied new colostomy bag. Tape applied to the edges.

## 2021-09-25 NOTE — Discharge Instructions (Addendum)
You were in the emergency department today for increased ostomy output and pain.  Your work-up showed that you were dehydrated with elevated kidney function test, he also had some findings of mild anemia and mildly low protein and calcium levels.  You were given fluids in the emergency department.  Please have your blood work rechecked by your primary care provider very closely, please schedule an appointment for soon as possible.  Please be sure to stay well-hydrated drinking plenty of electrolyte containing fluids.  Please change your ostomy bag regularly and as needed.  We are sending you home with a prescription for norco to help with pain. -Norco-this is a narcotic/controlled substance medication that has potential addicting qualities.  We recommend that you take 1-2 tablets every 6 hours as needed for severe pain.  Do not drive or operate heavy machinery when taking this medicine as it can be sedating. Do not drink alcohol or take other sedating medications when taking this medicine for safety reasons.  Keep this out of reach of small children.  Please be aware this medicine has Tylenol in it (325 mg/tab) do not exceed the maximum dose of Tylenol in a day per over the counter recommendations should you decide to supplement with Tylenol over the counter.   We have prescribed you new medication(s) today. Discuss the medications prescribed today with your pharmacist as they can have adverse effects and interactions with your other medicines including over the counter and prescribed medications. Seek medical evaluation if you start to experience new or abnormal symptoms after taking one of these medicines, seek care immediately if you start to experience difficulty breathing, feeling of your throat closing, facial swelling, or rash as these could be indications of a more serious allergic reaction  Please follow-up with your primary care soon as possible.  Please also establish with the wound care clinic  when they open again.  Return to the emergency department for any new or worsening symptoms including but not limited to new or worsening pain, fever, inability to keep fluids down, increased pain, blood in your stool, passing out, dizziness, or any other concerns.

## 2021-09-25 NOTE — ED Notes (Signed)
Patients colostomy device applied.

## 2021-09-25 NOTE — ED Notes (Signed)
Walked into room, patients colostomy bag was full. Reminded her to empty it. Patients was given DC instructions and clothes from the gently used closet. Patient is waiting in the lobby for transportation.

## 2021-09-26 ENCOUNTER — Inpatient Hospital Stay (HOSPITAL_COMMUNITY)
Admission: EM | Admit: 2021-09-26 | Discharge: 2021-09-29 | DRG: 683 | Disposition: A | Discharge status: Home / Self Care | Payer: Medicaid Other | Attending: General Surgery | Admitting: General Surgery

## 2021-09-26 ENCOUNTER — Other Ambulatory Visit: Payer: Self-pay

## 2021-09-26 DIAGNOSIS — D638 Anemia in other chronic diseases classified elsewhere: Secondary | ICD-10-CM | POA: Diagnosis present

## 2021-09-26 DIAGNOSIS — K219 Gastro-esophageal reflux disease without esophagitis: Secondary | ICD-10-CM | POA: Diagnosis present

## 2021-09-26 DIAGNOSIS — I341 Nonrheumatic mitral (valve) prolapse: Secondary | ICD-10-CM | POA: Diagnosis present

## 2021-09-26 DIAGNOSIS — N179 Acute kidney failure, unspecified: Principal | ICD-10-CM | POA: Diagnosis present

## 2021-09-26 DIAGNOSIS — Z432 Encounter for attention to ileostomy: Secondary | ICD-10-CM

## 2021-09-26 DIAGNOSIS — Z888 Allergy status to other drugs, medicaments and biological substances status: Secondary | ICD-10-CM

## 2021-09-26 DIAGNOSIS — R198 Other specified symptoms and signs involving the digestive system and abdomen: Secondary | ICD-10-CM

## 2021-09-26 DIAGNOSIS — F17210 Nicotine dependence, cigarettes, uncomplicated: Secondary | ICD-10-CM | POA: Diagnosis present

## 2021-09-26 DIAGNOSIS — K5000 Crohn's disease of small intestine without complications: Secondary | ICD-10-CM | POA: Diagnosis present

## 2021-09-26 DIAGNOSIS — F32A Depression, unspecified: Secondary | ICD-10-CM | POA: Diagnosis present

## 2021-09-26 DIAGNOSIS — I10 Essential (primary) hypertension: Secondary | ICD-10-CM | POA: Diagnosis present

## 2021-09-26 DIAGNOSIS — F419 Anxiety disorder, unspecified: Secondary | ICD-10-CM | POA: Diagnosis present

## 2021-09-26 DIAGNOSIS — Z20822 Contact with and (suspected) exposure to covid-19: Secondary | ICD-10-CM | POA: Diagnosis present

## 2021-09-26 DIAGNOSIS — Z932 Ileostomy status: Secondary | ICD-10-CM

## 2021-09-26 LAB — BASIC METABOLIC PANEL
Anion gap: 9 (ref 5–15)
BUN: 45 mg/dL — ABNORMAL HIGH (ref 8–23)
CO2: 17 mmol/L — ABNORMAL LOW (ref 22–32)
Calcium: 9 mg/dL (ref 8.9–10.3)
Chloride: 106 mmol/L (ref 98–111)
Creatinine, Ser: 1.99 mg/dL — ABNORMAL HIGH (ref 0.44–1.00)
GFR, Estimated: 28 mL/min — ABNORMAL LOW (ref >60)
Glucose, Bld: 83 mg/dL (ref 70–99)
Potassium: 4.1 mmol/L (ref 3.5–5.1)
Sodium: 132 mmol/L — ABNORMAL LOW (ref 135–145)

## 2021-09-26 LAB — CBC WITH DIFFERENTIAL/PLATELET
Abs Immature Granulocytes: 0.04 10*3/uL (ref 0.00–0.07)
Basophils Absolute: 0 10*3/uL (ref 0.0–0.1)
Basophils Relative: 0 %
Eosinophils Absolute: 0.2 10*3/uL (ref 0.0–0.5)
Eosinophils Relative: 2 %
HCT: 31.3 % — ABNORMAL LOW (ref 36.0–46.0)
Hemoglobin: 10.2 g/dL — ABNORMAL LOW (ref 12.0–15.0)
Immature Granulocytes: 0 %
Lymphocytes Relative: 22 %
Lymphs Abs: 2.2 10*3/uL (ref 0.7–4.0)
MCH: 30.6 pg (ref 26.0–34.0)
MCHC: 32.6 g/dL (ref 30.0–36.0)
MCV: 94 fL (ref 80.0–100.0)
Monocytes Absolute: 0.8 10*3/uL (ref 0.1–1.0)
Monocytes Relative: 7 %
Neutro Abs: 6.9 10*3/uL (ref 1.7–7.7)
Neutrophils Relative %: 69 %
Platelets: 354 10*3/uL (ref 150–400)
RBC: 3.33 MIL/uL — ABNORMAL LOW (ref 3.87–5.11)
RDW: 13.3 % (ref 11.5–15.5)
WBC: 10.2 10*3/uL (ref 4.0–10.5)
nRBC: 0 % (ref 0.0–0.2)

## 2021-09-26 LAB — RESP PANEL BY RT-PCR (FLU A&B, COVID) ARPGX2
Influenza A by PCR: NEGATIVE
Influenza B by PCR: NEGATIVE
SARS Coronavirus 2 by RT PCR: NEGATIVE

## 2021-09-26 MED ORDER — METOPROLOL TARTRATE 5 MG/5ML IV SOLN: 5.0000 mg | Freq: Four times a day (QID) | INTRAVENOUS | Status: DC | PRN

## 2021-09-26 MED ORDER — LOPERAMIDE HCL 2 MG PO CAPS
2.0000 mg | ORAL_CAPSULE | Freq: Three times a day (TID) | ORAL | Status: DC
  Administered 2021-09-26 – 2021-09-29 (×8): 2 mg via ORAL
  Filled 2021-09-26 (×8): qty 1

## 2021-09-26 MED ORDER — OXYCODONE HCL 5 MG PO TABS
5.0000 mg | ORAL_TABLET | Freq: Four times a day (QID) | ORAL | Status: DC | PRN
  Administered 2021-09-27: 5 mg via ORAL
  Filled 2021-09-26: qty 1

## 2021-09-26 MED ORDER — ONDANSETRON 4 MG PO TBDP: 4.0000 mg | ORAL_TABLET | Freq: Four times a day (QID) | ORAL | Status: DC | PRN

## 2021-09-26 MED ORDER — SODIUM CHLORIDE 0.9 % IV BOLUS
2000.0000 mL | Freq: Once | INTRAVENOUS | Status: AC
  Administered 2021-09-26: 2000 mL via INTRAVENOUS

## 2021-09-26 MED ORDER — POLYETHYLENE GLYCOL 3350 17 G PO PACK: 17.0000 g | PACK | Freq: Every day | ORAL | Status: DC | PRN

## 2021-09-26 MED ORDER — SODIUM CHLORIDE 0.9 % IV BOLUS
1000.0000 mL | Freq: Once | INTRAVENOUS | Status: AC
  Administered 2021-09-26: 1000 mL via INTRAVENOUS

## 2021-09-26 MED ORDER — MORPHINE SULFATE (PF) 2 MG/ML IV SOLN
2.0000 mg | INTRAVENOUS | Status: DC | PRN
  Administered 2021-09-26: 2 mg via INTRAVENOUS
  Filled 2021-09-26: qty 1

## 2021-09-26 MED ORDER — KCL IN DEXTROSE-NACL 20-5-0.45 MEQ/L-%-% IV SOLN
INTRAVENOUS | Status: DC
  Filled 2021-09-26 (×6): qty 1000

## 2021-09-26 MED ORDER — LORAZEPAM 2 MG/ML IJ SOLN
2.0000 mg | Freq: Once | INTRAMUSCULAR | Status: AC
  Administered 2021-09-26: 2 mg via INTRAVENOUS
  Filled 2021-09-26: qty 1

## 2021-09-26 MED ORDER — ZINC OXIDE 12.8 % EX OINT
TOPICAL_OINTMENT | CUTANEOUS | Status: DC | PRN
  Filled 2021-09-26 (×2): qty 56.7

## 2021-09-26 MED ORDER — ENOXAPARIN SODIUM 30 MG/0.3ML IJ SOSY
30.0000 mg | PREFILLED_SYRINGE | INTRAMUSCULAR | Status: DC
  Administered 2021-09-26: 30 mg via SUBCUTANEOUS
  Filled 2021-09-26: qty 0.3

## 2021-09-26 MED ORDER — ACETAMINOPHEN 650 MG RE SUPP: 650.0000 mg | Freq: Four times a day (QID) | RECTAL | Status: DC | PRN

## 2021-09-26 MED ORDER — CHOLESTYRAMINE LIGHT 4 G PO PACK
4.0000 g | PACK | Freq: Three times a day (TID) | ORAL | Status: DC
  Administered 2021-09-26 – 2021-09-29 (×7): 4 g via ORAL
  Filled 2021-09-26 (×11): qty 1

## 2021-09-26 MED ORDER — ONDANSETRON HCL 4 MG/2ML IJ SOLN: 4.0000 mg | Freq: Four times a day (QID) | INTRAMUSCULAR | Status: DC | PRN

## 2021-09-26 MED ORDER — ACETAMINOPHEN 325 MG PO TABS: 650.0000 mg | ORAL_TABLET | Freq: Four times a day (QID) | ORAL | Status: DC | PRN

## 2021-09-26 NOTE — ED Triage Notes (Signed)
BIB [facility] from home. Burning pain around stoma. Pt reports bag isnt staying on correctly. Skin break down around area noted.   110/80 100% RA 104 heart rate CBG 71

## 2021-09-26 NOTE — ED Notes (Signed)
Ostomy bag placed over ostomy, zinc barrier cream applied to reddened areas uncovered by ostomy bag.

## 2021-09-26 NOTE — ED Provider Notes (Signed)
[facility] EMERGENCY DEPARTMENT Provider Note   CSN: [id number] Arrival date & time: 09/26/21  1835     History Chief Complaint  Patient presents with   Abdominal Pain    Cynthia [name] is a 60 y.o. female.  Presents with chief complaint of complications of her ileostomy.  She states that she is been having this difficulty on and off for the past month or so.  She is been seen in the ER several times referred to ostomy clinic several times but she states she is unable to follow-up with clinic as no one is contacted her.  She states that her ileostomy has been having increased output for the past month she feels like this causes the back not to stick as much.  She is noticed induration and redness around her ostomy site ongoing for the past month that waxes and wanes in intensity.  The back fell off again today, she is complaining of persistent burning sensation to the skin surrounding the back.  Otherwise denies any fevers or cough denies any vomiting denies any chest pain or abdominal pain.      Past Medical History:  Diagnosis Date   Anemia 2012   Anxiety    Bronchitis    Crohn's ileitis ([place])    Depression    GERD (gastroesophageal reflux disease)    Headache(784.0)    MIGRAINE   Heart murmur    Hiatal hernia    Hypertension    IBS (irritable bowel syndrome)    Incontinence    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HCC)    MVP (mitral valve prolapse)    RLS (restless legs syndrome)    Tubular adenoma of colon 08/2019    Patient Active Problem List   Diagnosis Date Noted   High output ileostomy ([place]) 09/26/2021   Acute renal failure (ARF) ([place]) 08/12/2021   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[place])    Protein-calorie malnutrition, severe 07/11/2021   Hypotension 07/10/2021   High anion gap metabolic acidosis 73/41/9379   Acute renal failure, unspecified acute renal failure type ([place]) 07/10/2021   Shock circulatory (HCC)    Nausea and vomiting 06/26/2021   Abdominal pain  06/26/2021   Sepsis ([place]) 06/26/2021   Sepsis due to cellulitis ([place]) 04/05/2021   AKI (acute kidney injury) ([place]) 04/05/2021   Hyperkalemia 04/05/2021   Hyponatremia 04/05/2021   Adjustment disorder 03/14/2021   Unspecified severe protein-calorie malnutrition ([place]) 03/13/2021   COVID-19 01/25/2021   Leukocytosis 01/24/2021   Crohn's colitis, with intestinal obstruction ([place]) 01/24/2021   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   History of rectal polyp 2020 01/12/2021   Anxiety associated with depression 01/12/2021   Hypokalemia 12/04/2020   Cough 11/27/2020   Otalgia of both ears 11/27/2020   Body aches 11/27/2020   Fever 11/27/2020   High risk medication use 11/27/2020   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[place]) 06/27/2020   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   Crohn's ileitis, with intestinal obstruction ([place]) 10/05/2019   Colitis    Rectal polyp    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   RLS (restless legs syndrome) 09/25/2014   Current smoker 09/19/2011   Migraine headache 09/19/2011   Essential hypertension 09/19/2011   Obesity (BMI 30-39.9) 09/19/2011   History of serrated colonic polyp 01/13/2011   Gastroesophageal reflux disease 03/17/2008   MVP (mitral valve prolapse) 03/17/2008   HIATAL HERNIA 05/03/2002    Past Surgical  History:  Procedure Laterality Date   ABDOMINAL HYSTERECTOMY  06/17/2011   Procedure: HYSTERECTOMY ABDOMINAL;  Surgeon: Arloa Koh;  Location: [facility];  Service: Gynecology;  Laterality: N/A;  Converted to Abdominal Hysterectomy with lysis of adhesions    ANTERIOR AND POSTERIOR REPAIR  02/25/2012   Procedure: ANTERIOR (CYSTOCELE) AND POSTERIOR REPAIR (RECTOCELE);  Surgeon: Daria Pastures, MD;  Location: [facility];  Service: Gynecology;  Laterality: N/A;  ANterior cystocele repair ONLY   BIOPSY  08/29/2019   Procedure: BIOPSY;   Surgeon: Lavena Bullion, DO;  Location: [facility];  Service: Gastroenterology;;   BLADDER SUSPENSION  02/25/2012   Procedure: TRANSVAGINAL TAPE (TVT) PROCEDURE;  Surgeon: Daria Pastures, MD;  Location: [facility];  Service: Gynecology;  Laterality: N/A;   COLONOSCOPY WITH PROPOFOL N/A 08/29/2019   Procedure: COLONOSCOPY WITH PROPOFOL;  Surgeon: Lavena Bullion, DO;  Location: [facility];  Service: Gastroenterology;  Laterality: N/A;   CYSTOSCOPY  02/25/2012   Procedure: CYSTOSCOPY;  Surgeon: Daria Pastures, MD;  Location: [facility];  Service: Gynecology;  Laterality: N/A;   ILEOSTOMY     KNEE ARTHROSCOPY  2010   LAPAROSCOPIC ASSISTED VAGINAL HYSTERECTOMY  06/17/2011   Procedure: LAPAROSCOPIC ASSISTED VAGINAL HYSTERECTOMY;  Surgeon: Arloa Koh;  Location: [facility];  Service: Gynecology;  Laterality: N/A;  Attempted    POLYPECTOMY  08/29/2019   Procedure: POLYPECTOMY;  Surgeon: Lavena Bullion, DO;  Location: [facility];  Service: Gastroenterology;;   SALPINGOOPHORECTOMY  06/17/2011   Procedure: SALPINGO OOPHERECTOMY;  Surgeon: Arloa Koh;  Location: [facility];  Service: Gynecology;  Laterality: Bilateral;   TONSILLECTOMY       OB History   No obstetric history on file.     Family History  Problem Relation Age of Onset   Diabetes Mother    Dementia Mother    Clotting disorder Father    Macular degeneration Father    Crohn's disease Sister    Colon cancer Neg Hx    Esophageal cancer Neg Hx    Pancreatic cancer Neg Hx    Stomach cancer Neg Hx    Liver disease Neg Hx     Social History   Tobacco Use   Smoking status: Every Day    Packs/day: 0.00    Types: Cigarettes   Smokeless tobacco: Never   Tobacco comments:    none in 2 weeks as of 12/04/20  Vaping Use   Vaping Use: Some days  Substance Use Topics   Alcohol use: Not Currently   Drug use: Not Currently    Types: Cocaine    Comment: clean x 8 weeks    Home Medications Prior to Admission medications    Medication Sig Start Date End Date Taking? Authorizing Provider  acetaminophen (TYLENOL) 500 MG tablet Take 1,000 mg by mouth every 6 (six) hours as needed for headache or mild pain.    [provider]  cholestyramine (QUESTRAN) 4 g packet Take 1 packet (4 g total) by mouth 3 (three) times daily. Patient taking differently: Take 4 g by mouth 3 (three) times a week. 08/20/21 09/19/21  Barb Merino, MD  HYDROcodone-acetaminophen (NORCO/VICODIN) 5-325 MG tablet Take 1-2 tablets by mouth every 6 (six) hours as needed. 09/25/21   Petrucelli, Samantha R, PA-C  ibuprofen (ADVIL) 200 MG tablet Take 400 mg by mouth every 6 (six) hours as needed for mild pain or headache.    [provider]    Allergies    Iodine  Review of  Systems   Review of Systems  Constitutional:  Negative for fever.  HENT:  Negative for ear pain.   Eyes:  Negative for pain.  Respiratory:  Negative for cough.   Cardiovascular:  Negative for chest pain.  Gastrointestinal:  Negative for abdominal pain.  Genitourinary:  Negative for flank pain.  Musculoskeletal:  Negative for back pain.  Skin:  Positive for rash.  Neurological:  Negative for headaches.   Physical Exam Updated Vital Signs BP 123/78 (BP Location: Right Arm)   Pulse (!) 104   Temp 98.3 F (36.8 C) (Oral)   Resp 20   LMP 06/10/2011 (Exact Date)   SpO2 100%   Physical Exam Constitutional:      General: She is not in acute distress.    Appearance: Normal appearance.  HENT:     Head: Normocephalic.     Nose: Nose normal.  Eyes:     Extraocular Movements: Extraocular movements intact.  Cardiovascular:     Rate and Rhythm: Normal rate.  Pulmonary:     Effort: Pulmonary effort is normal.  Musculoskeletal:        General: Normal range of motion.     Cervical back: Normal range of motion.  Skin:    Comments: Skin around the ileostomy site appears beefy red raised.  No ulcerative lesions noted.  Total surface area of approximately  20 x 30cm.  Neurological:     General: No focal deficit present.     Mental Status: She is alert. Mental status is at baseline.     ED Results / Procedures / Treatments   Labs (all labs ordered are listed, but only abnormal results are displayed) Labs Reviewed  RESP PANEL BY RT-PCR (FLU A&B, COVID) ARPGX2  CBC WITH DIFFERENTIAL/PLATELET  BASIC METABOLIC PANEL  COMPREHENSIVE METABOLIC PANEL  CBC  CBC  RAPID URINE DRUG SCREEN, HOSP PERFORMED    EKG None  Radiology No results found.  Procedures Procedures   Medications Ordered in ED Medications  Zinc Oxide (TRIPLE PASTE) 12.8 % ointment (has no administration in time range)  cholestyramine light (PREVALITE) packet 4 g (has no administration in time range)  loperamide (IMODIUM) capsule 2 mg (has no administration in time range)  dextrose 5 % and 0.45 % NaCl with KCl 20 mEq/L infusion (has no administration in time range)  acetaminophen (TYLENOL) tablet 650 mg (has no administration in time range)    Or  acetaminophen (TYLENOL) suppository 650 mg (has no administration in time range)  oxyCODONE (Oxy IR/ROXICODONE) immediate release tablet 5 mg (has no administration in time range)  polyethylene glycol (MIRALAX / GLYCOLAX) packet 17 g (has no administration in time range)  ondansetron (ZOFRAN-ODT) disintegrating tablet 4 mg (has no administration in time range)    Or  ondansetron (ZOFRAN) injection 4 mg (has no administration in time range)  metoprolol tartrate (LOPRESSOR) injection 5 mg (has no administration in time range)  enoxaparin (LOVENOX) injection 30 mg (has no administration in time range)  sodium chloride 0.9 % bolus 2,000 mL (has no administration in time range)  sodium chloride 0.9 % bolus 1,000 mL (1,000 mLs Intravenous New Bag/Given 09/26/21 1906)    ED Course  I have reviewed the triage vital signs and the nursing notes.  Pertinent labs & imaging results that were available during my care of the patient  were reviewed by me and considered in my medical decision making (see chart for details).    MDM Rules/Calculators/A&P  Basic labs been sent.  Given this is Thanksgiving day, there is no OR nurse or ostomy nurse available in the hospital to assist [place] in managing this ostomy wound.  Consultation with on-call surgery who came and saw the patient.  We will bring them into their service for overnight observation.  Final Clinical Impression(s) / ED Diagnoses Final diagnoses:  Encounter for attention to ileostomy [facility])    Rx / DC Orders ED Discharge Orders     None        Luna Fuse, MD 09/26/21 [phone]

## 2021-09-26 NOTE — H&P (Signed)
Reason for Consult:ostomy problems   Cynthia [name] is an 60 y.o. female.  HPI: 60 yo female with Crohn's underwent ileocecectomy and sigmoidectomy in 02/2021 by Dr. Hilliard Clark at [facility]. Since then she had has intermittent issues with ostomy output. She has had multiple AKI ED visits and multiple issues with pouching and skin breakdown. She was following with [facility] but due to multiple no shows she was discharged from the practice.  She states she called [facility] 11/23 to try to get follow up with them but they were not able to schedule due to the holiday. She has not taken anything to slow the output down and states that is why she was reaching out to the GI group. Other than this output issue she denies other Crohn's flares since surgery and is not on any chronic medication for Crohn's.  Past Medical History:  Diagnosis Date   Anemia 2012   Anxiety    Bronchitis    Crohn's ileitis ([place])    Depression    GERD (gastroesophageal reflux disease)    Headache(784.0)    MIGRAINE   Heart murmur    Hiatal hernia    Hypertension    IBS (irritable bowel syndrome)    Incontinence    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HCC)    MVP (mitral valve prolapse)    RLS (restless legs syndrome)    Tubular adenoma of colon 08/2019    Past Surgical History:  Procedure Laterality Date   ABDOMINAL HYSTERECTOMY  06/17/2011   Procedure: HYSTERECTOMY ABDOMINAL;  Surgeon: Arloa Koh;  Location: [facility];  Service: Gynecology;  Laterality: N/A;  Converted to Abdominal Hysterectomy with lysis of adhesions    ANTERIOR AND POSTERIOR REPAIR  02/25/2012   Procedure: ANTERIOR (CYSTOCELE) AND POSTERIOR REPAIR (RECTOCELE);  Surgeon: Daria Pastures, MD;  Location: [facility];  Service: Gynecology;  Laterality: N/A;  ANterior cystocele repair ONLY   BIOPSY  08/29/2019   Procedure: BIOPSY;  Surgeon: Lavena Bullion, DO;  Location: [facility];  Service: Gastroenterology;;   BLADDER SUSPENSION  02/25/2012   Procedure:  TRANSVAGINAL TAPE (TVT) PROCEDURE;  Surgeon: Daria Pastures, MD;  Location: [facility];  Service: Gynecology;  Laterality: N/A;   COLONOSCOPY WITH PROPOFOL N/A 08/29/2019   Procedure: COLONOSCOPY WITH PROPOFOL;  Surgeon: Lavena Bullion, DO;  Location: [facility];  Service: Gastroenterology;  Laterality: N/A;   CYSTOSCOPY  02/25/2012   Procedure: CYSTOSCOPY;  Surgeon: Daria Pastures, MD;  Location: [facility];  Service: Gynecology;  Laterality: N/A;   ILEOSTOMY     KNEE ARTHROSCOPY  2010   LAPAROSCOPIC ASSISTED VAGINAL HYSTERECTOMY  06/17/2011   Procedure: LAPAROSCOPIC ASSISTED VAGINAL HYSTERECTOMY;  Surgeon: Arloa Koh;  Location: [facility];  Service: Gynecology;  Laterality: N/A;  Attempted    POLYPECTOMY  08/29/2019   Procedure: POLYPECTOMY;  Surgeon: Lavena Bullion, DO;  Location: [facility];  Service: Gastroenterology;;   SALPINGOOPHORECTOMY  06/17/2011   Procedure: SALPINGO OOPHERECTOMY;  Surgeon: Arloa Koh;  Location: [facility];  Service: Gynecology;  Laterality: Bilateral;   TONSILLECTOMY      Family History  Problem Relation Age of Onset   Diabetes Mother    Dementia Mother    Clotting disorder Father    Macular degeneration Father    Crohn's disease Sister    Colon cancer Neg Hx    Esophageal cancer Neg Hx    Pancreatic cancer Neg Hx    Stomach cancer Neg Hx    Liver disease Neg  Hx     Social History:  reports that she has been smoking cigarettes. She has never used smokeless tobacco. She reports that she does not currently use alcohol. She reports that she does not currently use drugs after having used the following drugs: Cocaine.  Allergies:  Allergies  Allergen Reactions   Iodine Other (See Comments)    The patient said she had a reaction from iodine in some eye drops- caused eyes to turn red;  pt has no reactions to CT contrast   Medications: I have reviewed the patient's current medications.  Results for orders placed or performed during the hospital  encounter of 09/24/21 (from the past 48 hour(s))  CBC with Differential     Status: Abnormal   Collection Time: 09/24/21 11:12 PM  Result Value Ref Range   WBC 11.6 (H) 4.0 - 10.5 K/uL   RBC 3.04 (L) 3.87 - 5.11 MIL/uL   Hemoglobin 9.3 (L) 12.0 - 15.0 g/dL   HCT 28.4 (L) 36.0 - 46.0 %   MCV 93.4 80.0 - 100.0 fL   MCH 30.6 26.0 - 34.0 pg   MCHC 32.7 30.0 - 36.0 g/dL   RDW 13.5 11.5 - 15.5 %   Platelets 317 150 - 400 K/uL   nRBC 0.0 0.0 - 0.2 %   Neutrophils Relative % 71 %   Neutro Abs 8.1 (H) 1.7 - 7.7 K/uL   Lymphocytes Relative 21 %   Lymphs Abs 2.4 0.7 - 4.0 K/uL   Monocytes Relative 6 %   Monocytes Absolute 0.7 0.1 - 1.0 K/uL   Eosinophils Relative 2 %   Eosinophils Absolute 0.2 0.0 - 0.5 K/uL   Basophils Relative 0 %   Basophils Absolute 0.0 0.0 - 0.1 K/uL   Immature Granulocytes 0 %   Abs Immature Granulocytes 0.05 0.00 - 0.07 K/uL    Comment: Performed at [facility], [place] [address], [place], [place] [address]  Comprehensive metabolic panel     Status: Abnormal   Collection Time: 09/24/21 11:12 PM  Result Value Ref Range   Sodium 131 (L) 135 - 145 mmol/L   Potassium 4.7 3.5 - 5.1 mmol/L   Chloride 109 98 - 111 mmol/L   CO2 17 (L) 22 - 32 mmol/L   Glucose, Bld 110 (H) 70 - 99 mg/dL    Comment: Glucose reference range applies only to samples taken after fasting for at least 8 hours.   BUN 62 (H) 8 - 23 mg/dL   Creatinine, Ser 2.55 (H) 0.44 - 1.00 mg/dL   Calcium 8.1 (L) 8.9 - 10.3 mg/dL   Total Protein 6.4 (L) 6.5 - 8.1 g/dL   Albumin 3.4 (L) 3.5 - 5.0 g/dL   AST 13 (L) 15 - 41 U/L   ALT 12 0 - 44 U/L   Alkaline Phosphatase 56 38 - 126 U/L   Total Bilirubin 0.4 0.3 - 1.2 mg/dL   GFR, Estimated 21 (L) >60 mL/min    Comment: (NOTE) Calculated using the CKD-EPI Creatinine Equation (2021)    Anion gap 5 5 - 15    Comment: Performed at [facility], [place] [address], [place], [place] [address]  Magnesium     Status: None    Collection Time: 09/24/21 11:12 PM  Result Value Ref Range   Magnesium 1.8 1.7 - 2.4 mg/dL    Comment: Performed at [facility], [place] [address], [place], [place] [address]    No results found.  Review  of Systems  Constitutional: Negative.   HENT: Negative.    Eyes: Negative.   Respiratory: Negative.    Cardiovascular: Negative.   Gastrointestinal:  Positive for abdominal pain and diarrhea.  Genitourinary: Negative.   Musculoskeletal: Negative.   Skin: Negative.   Neurological: Negative.   Endo/Heme/Allergies: Negative.   Psychiatric/Behavioral: Negative.     PE Blood pressure 123/78, pulse (!) 104, temperature 98.3 F (36.8 C), temperature source Oral, resp. rate 20, last menstrual period 06/10/2011, SpO2 100 %. Constitutional: NAD; conversant; no deformities Eyes: Moist conjunctiva; no lid lag; anicteric; PERRL Neck: Trachea midline; no thyromegaly Lungs: Normal respiratory effort; no tactile fremitus CV: RRR; no palpable thrills; no pitting edema GI: Abd right sided abdomen with erythema 8 cm out from ileostomy, ostomy with 3-4 cm length above skin; no palpable hepatosplenomegaly MSK: Normal gait; no clubbing/cyanosis Psychiatric: Appropriate affect; alert and oriented x3 Lymphatic: No palpable cervical or axillary lymphadenopathy Skin: No major subcutaneous nodules. Warm and dry   Assessment/Plan: 60 yo female in AKI with high output ileostomy -imodium, questran -IV fluid boluses -zinc oxide over skin and then pouch to try to keep liquid away from skin -ostomy nurse consult  Cynthia [name] 09/26/2021, 7:49 PM

## 2021-09-27 ENCOUNTER — Encounter (HOSPITAL_COMMUNITY): Payer: Self-pay

## 2021-09-27 DIAGNOSIS — Z432 Encounter for attention to ileostomy: Secondary | ICD-10-CM | POA: Diagnosis not present

## 2021-09-27 DIAGNOSIS — I10 Essential (primary) hypertension: Secondary | ICD-10-CM | POA: Diagnosis present

## 2021-09-27 DIAGNOSIS — K219 Gastro-esophageal reflux disease without esophagitis: Secondary | ICD-10-CM | POA: Diagnosis present

## 2021-09-27 DIAGNOSIS — F32A Depression, unspecified: Secondary | ICD-10-CM | POA: Diagnosis present

## 2021-09-27 DIAGNOSIS — F419 Anxiety disorder, unspecified: Secondary | ICD-10-CM | POA: Diagnosis present

## 2021-09-27 DIAGNOSIS — Z20822 Contact with and (suspected) exposure to covid-19: Secondary | ICD-10-CM | POA: Diagnosis present

## 2021-09-27 DIAGNOSIS — I341 Nonrheumatic mitral (valve) prolapse: Secondary | ICD-10-CM | POA: Diagnosis present

## 2021-09-27 DIAGNOSIS — Z888 Allergy status to other drugs, medicaments and biological substances status: Secondary | ICD-10-CM | POA: Diagnosis not present

## 2021-09-27 DIAGNOSIS — Z932 Ileostomy status: Secondary | ICD-10-CM | POA: Diagnosis not present

## 2021-09-27 DIAGNOSIS — F17210 Nicotine dependence, cigarettes, uncomplicated: Secondary | ICD-10-CM | POA: Diagnosis present

## 2021-09-27 DIAGNOSIS — N179 Acute kidney failure, unspecified: Secondary | ICD-10-CM | POA: Diagnosis not present

## 2021-09-27 DIAGNOSIS — K5000 Crohn's disease of small intestine without complications: Secondary | ICD-10-CM | POA: Diagnosis present

## 2021-09-27 DIAGNOSIS — D638 Anemia in other chronic diseases classified elsewhere: Secondary | ICD-10-CM | POA: Diagnosis present

## 2021-09-27 LAB — CBC
HCT: 24.1 % — ABNORMAL LOW (ref 36.0–46.0)
Hemoglobin: 7.7 g/dL — ABNORMAL LOW (ref 12.0–15.0)
MCH: 30.9 pg (ref 26.0–34.0)
MCHC: 32 g/dL (ref 30.0–36.0)
MCV: 96.8 fL (ref 80.0–100.0)
Platelets: 257 10*3/uL (ref 150–400)
RBC: 2.49 MIL/uL — ABNORMAL LOW (ref 3.87–5.11)
RDW: 13.3 % (ref 11.5–15.5)
WBC: 8.4 10*3/uL (ref 4.0–10.5)
nRBC: 0 % (ref 0.0–0.2)

## 2021-09-27 LAB — COMPREHENSIVE METABOLIC PANEL
ALT: 12 U/L (ref 0–44)
AST: 14 U/L — ABNORMAL LOW (ref 15–41)
Albumin: 2.4 g/dL — ABNORMAL LOW (ref 3.5–5.0)
Alkaline Phosphatase: 41 U/L (ref 38–126)
Anion gap: 3 — ABNORMAL LOW (ref 5–15)
BUN: 33 mg/dL — ABNORMAL HIGH (ref 8–23)
CO2: 15 mmol/L — ABNORMAL LOW (ref 22–32)
Calcium: 7.7 mg/dL — ABNORMAL LOW (ref 8.9–10.3)
Chloride: 117 mmol/L — ABNORMAL HIGH (ref 98–111)
Creatinine, Ser: 1.63 mg/dL — ABNORMAL HIGH (ref 0.44–1.00)
GFR, Estimated: 36 mL/min — ABNORMAL LOW (ref >60)
Glucose, Bld: 99 mg/dL (ref 70–99)
Potassium: 3.4 mmol/L — ABNORMAL LOW (ref 3.5–5.1)
Sodium: 135 mmol/L (ref 135–145)
Total Bilirubin: 0.6 mg/dL (ref 0.3–1.2)
Total Protein: 4.7 g/dL — ABNORMAL LOW (ref 6.5–8.1)

## 2021-09-27 LAB — RAPID URINE DRUG SCREEN, HOSP PERFORMED
Amphetamines: NOT DETECTED
Barbiturates: NOT DETECTED
Benzodiazepines: NOT DETECTED
Cocaine: POSITIVE — AB
Opiates: POSITIVE — AB
Tetrahydrocannabinol: NOT DETECTED

## 2021-09-27 MED ORDER — ENOXAPARIN SODIUM 30 MG/0.3ML IJ SOSY
30.0000 mg | PREFILLED_SYRINGE | INTRAMUSCULAR | Status: DC
  Administered 2021-09-27 – 2021-09-29 (×3): 30 mg via SUBCUTANEOUS
  Filled 2021-09-27 (×3): qty 0.3

## 2021-09-27 MED ORDER — POTASSIUM CHLORIDE CRYS ER 20 MEQ PO TBCR
40.0000 meq | EXTENDED_RELEASE_TABLET | Freq: Two times a day (BID) | ORAL | Status: AC
  Administered 2021-09-27 (×2): 40 meq via ORAL
  Filled 2021-09-27 (×2): qty 2

## 2021-09-27 MED ORDER — CALCIUM POLYCARBOPHIL 625 MG PO TABS
625.0000 mg | ORAL_TABLET | Freq: Two times a day (BID) | ORAL | Status: DC
  Administered 2021-09-27 – 2021-09-29 (×4): 625 mg via ORAL
  Filled 2021-09-27 (×9): qty 1

## 2021-09-27 MED ORDER — OXYCODONE HCL 5 MG PO TABS
5.0000 mg | ORAL_TABLET | Freq: Four times a day (QID) | ORAL | Status: DC | PRN
  Administered 2021-09-28 – 2021-09-29 (×5): 5 mg via ORAL
  Filled 2021-09-27 (×5): qty 1

## 2021-09-27 MED ORDER — BOOST / RESOURCE BREEZE PO LIQD CUSTOM
1.0000 | Freq: Two times a day (BID) | ORAL | Status: DC
  Administered 2021-09-28 – 2021-09-29 (×3): 1 via ORAL
  Filled 2021-09-27: qty 1

## 2021-09-27 MED ORDER — FERROUS SULFATE 325 (65 FE) MG PO TABS
325.0000 mg | ORAL_TABLET | Freq: Two times a day (BID) | ORAL | Status: DC
  Administered 2021-09-27 – 2021-09-29 (×4): 325 mg via ORAL
  Filled 2021-09-27 (×4): qty 1

## 2021-09-27 MED ORDER — MORPHINE SULFATE (PF) 2 MG/ML IV SOLN
2.0000 mg | INTRAVENOUS | Status: DC | PRN
  Administered 2021-09-28: 2 mg via INTRAVENOUS
  Filled 2021-09-27: qty 1

## 2021-09-27 NOTE — Consult Note (Addendum)
[place] Nurse ostomy consult note Patient receiving care in [facility] ED38 This patient is known to the [facility] team from previous admissions Stoma type/location:  RLQ end ileostomy Stomal assessment/size:  Protruding 1 1/2" pink, moist.  Peristomal assessment: Extremely red and irritated and painful Treatment options for stomal/peristomal skin:  Crusting and barrier ring. May use triple paste around the skin barrier in the painful excoriated areas. Output: Thin yellow brown high output Ostomy pouching: 2pc. 1 3/4" HOP pouch with barrier ring and bedside drainage bag.   High Output 2 piece fecal pouching system PouchKellie Simmering # [phone] Skin barrier: [name] # 2 Barrier ringKellie Simmering # [phone] Stoma Powder: [name] # 6 3 M skin barrier wipes: [name] # [phone] Bedside drainage bag: [name] # [id number]        Cathlean Marseilles. Tamala Julian, MSN, RN, [place], [place] [place], [facility] Wound Treatment Associate Pager [phone]

## 2021-09-27 NOTE — Progress Notes (Signed)
Patient ID: MARLETA LAPIERRE, female   DOB: Sep 05, 1961, 60 y.o.   MRN: [id number] [facility] Surgery Progress Note     Subjective: CC-  Seen by [place] RN this morning, pouch with barrier ring and bedside drainage bag applied. Patient states that she still has a lot of burning in her skin around ostomy. Cr downtrending 1.63.  Objective: Vital signs in last 24 hours: Temp:  [97.8 F (36.6 C)-98.3 F (36.8 C)] 97.8 F (36.6 C) (11/25 0200) Pulse Rate:  [69-115] 93 (11/25 0900) Resp:  [15-20] 16 (11/25 0900) BP: (90-165)/(59-113) 100/68 (11/25 0900) SpO2:  [98 %-100 %] 100 % (11/25 0900)    Intake/Output from previous day: No intake/output data recorded. Intake/Output this shift: No intake/output data recorded.  PE: Gen:  Alert, NAD Pulm:  rate and effort normal Abd: soft, ND, ileostomy pink and protruding with liquid stool in bag, significant surrounding erythema/ skin excoriation  Skin: no rashes noted, warm and dry  Lab Results:  Recent Labs    09/26/21 1849 09/27/21 0349  WBC 10.2 8.4  HGB 10.2* 7.7*  HCT 31.3* 24.1*  PLT 354 257   BMET Recent Labs    09/26/21 1849 09/27/21 0349  NA 132* 135  K 4.1 3.4*  CL 106 117*  CO2 17* 15*  GLUCOSE 83 99  BUN 45* 33*  CREATININE 1.99* 1.63*  CALCIUM 9.0 7.7*   PT/INR No results for input(s): LABPROT, INR in the last 72 hours. CMP     Component Value Date/Time   NA 135 09/27/2021 0349   NA 136 07/19/2021 1324   K 3.4 (L) 09/27/2021 0349   CL 117 (H) 09/27/2021 0349   CO2 15 (L) 09/27/2021 0349   GLUCOSE 99 09/27/2021 0349   BUN 33 (H) 09/27/2021 0349   BUN 25 07/19/2021 1324   CREATININE 1.63 (H) 09/27/2021 0349   CREATININE 0.94 05/08/2016 0741   CALCIUM 7.7 (L) 09/27/2021 0349   PROT 4.7 (L) 09/27/2021 0349   PROT 5.5 (L) 07/19/2021 1324   ALBUMIN 2.4 (L) 09/27/2021 0349   ALBUMIN 3.4 (L) 07/19/2021 1324   AST 14 (L) 09/27/2021 0349   ALT 12 09/27/2021 0349   ALKPHOS 41 09/27/2021 0349    BILITOT 0.6 09/27/2021 0349   BILITOT <0.2 07/19/2021 1324   GFRNONAA 36 (L) 09/27/2021 0349   GFRAA 77 12/18/2020 1548   Lipase     Component Value Date/Time   LIPASE 73 (H) 09/14/2021 0525       Studies/Results: No results found.  Anti-infectives: Anti-infectives (From admission, onward)    None        Assessment/Plan Crohn's disease S/p ileocecectomy and sigmoidectomy in 02/2021  High ileostomy output with AKI and electrolyte derangements  - Creatinine and electrolytes improving. Continue IVF. Replete K.  Repeat labs in AM. - on questran and imodium. I added fibercon and iron BID. Monitor ileostomy output - will ultimately need follow up with her surgeon at [facility] (Dr. Evalyn Casco)  ID - none FEN - IVF, reg diet, replete K VTE - lovenox Foley - none  Anemia of chronic disease Anxiety/ depression GERD HTN UDS+ cocaine   LOS: 0 days    Wellington [place], [facility] Surgery 09/27/2021, 10:38 AM Please see Amion for pager number during day hours 7:00am-4:30pm

## 2021-09-27 NOTE — ED Notes (Signed)
RN replaced ostomy bag. Area around ostomy continues to be red and tender to touch.

## 2021-09-27 NOTE — ED Notes (Addendum)
RN helped pt attempt to empty ostomy bag. Due to increased irritation of the area, the ostomy bag was not able to stay secured to pt and was leaking. RN detached ostomy bag and cleaned around ostomy. Output green and runny. Pt gown changed and given fresh blankets. RN called materials to order another ostomy bag.

## 2021-09-27 NOTE — ED Notes (Signed)
Pt used the [facility] with assistance. Pt linens and gown was changed. Pt assisted with draining ostomy bag and holding in place while skin was cleaned.

## 2021-09-27 NOTE — ED Notes (Signed)
Pt has been non compliant throughout the shift, keeps taking off her leads, gown, and ostomy bag, Dr Kieth Brightly notified , 2 mg iv ativan given, Pt is verbally aggressive with  staff and will not stop getting out of bed or disconnecting equipment.

## 2021-09-28 ENCOUNTER — Encounter (HOSPITAL_COMMUNITY): Payer: Self-pay

## 2021-09-28 LAB — BASIC METABOLIC PANEL
Anion gap: 3 — ABNORMAL LOW (ref 5–15)
BUN: 29 mg/dL — ABNORMAL HIGH (ref 8–23)
CO2: 15 mmol/L — ABNORMAL LOW (ref 22–32)
Calcium: 8.1 mg/dL — ABNORMAL LOW (ref 8.9–10.3)
Chloride: 120 mmol/L — ABNORMAL HIGH (ref 98–111)
Creatinine, Ser: 1.78 mg/dL — ABNORMAL HIGH (ref 0.44–1.00)
GFR, Estimated: 32 mL/min — ABNORMAL LOW (ref >60)
Glucose, Bld: 98 mg/dL (ref 70–99)
Potassium: 4.6 mmol/L (ref 3.5–5.1)
Sodium: 138 mmol/L (ref 135–145)

## 2021-09-28 LAB — CBC
HCT: 27 % — ABNORMAL LOW (ref 36.0–46.0)
Hemoglobin: 8.3 g/dL — ABNORMAL LOW (ref 12.0–15.0)
MCH: 30.1 pg (ref 26.0–34.0)
MCHC: 30.7 g/dL (ref 30.0–36.0)
MCV: 97.8 fL (ref 80.0–100.0)
Platelets: 266 10*3/uL (ref 150–400)
RBC: 2.76 MIL/uL — ABNORMAL LOW (ref 3.87–5.11)
RDW: 13.7 % (ref 11.5–15.5)
WBC: 7.7 10*3/uL (ref 4.0–10.5)
nRBC: 0 % (ref 0.0–0.2)

## 2021-09-28 LAB — MAGNESIUM: Magnesium: 1.5 mg/dL — ABNORMAL LOW (ref 1.7–2.4)

## 2021-09-28 MED ORDER — MAGNESIUM SULFATE 2 GM/50ML IV SOLN
2.0000 g | Freq: Once | INTRAVENOUS | Status: AC
  Administered 2021-09-28: 2 g via INTRAVENOUS
  Filled 2021-09-28: qty 50

## 2021-09-28 NOTE — Progress Notes (Addendum)
Subjective: CC: States she had some leakage from the bag yesterday and had to tape the medial aspect. No further leakage. Output has thickened. 660cc/24 hours. Tolerating diet without n/v. Burning around her ostomy is more tolerable. Reports she has been mobilizing in her room this morning.   Objective: Vital signs in last 24 hours: Temp:  [98 F (36.7 C)-98.1 F (36.7 C)] 98.1 F (36.7 C) (11/26 0810) Pulse Rate:  [69-98] 87 (11/26 0810) Resp:  [17-19] 17 (11/26 0810) BP: (90-112)/(58-76) 112/73 (11/26 0810) SpO2:  [99 %-100 %] 100 % (11/26 0810) Last BM Date: 09/27/21  Intake/Output from previous day: 11/25 0701 - 11/26 0700 In: 480 [P.O.:480] Out: 910 [Urine:250; Stool:660] Intake/Output this shift: Total I/O In: 510 [P.O.:510] Out: -   PE: Gen:  Alert, NAD Pulm:  rate and effort normal Abd: soft, ND, ileostomy pink and protruding with semi-solid stool in bag. She has surrounding erythema/ skin excoriation  Skin: no rashes noted, warm and dry  Lab Results:  Recent Labs    09/27/21 0349 09/28/21 0156  WBC 8.4 7.7  HGB 7.7* 8.3*  HCT 24.1* 27.0*  PLT 257 266   BMET Recent Labs    09/27/21 0349 09/28/21 0156  NA 135 138  K 3.4* 4.6  CL 117* 120*  CO2 15* 15*  GLUCOSE 99 98  BUN 33* 29*  CREATININE 1.63* 1.78*  CALCIUM 7.7* 8.1*   PT/INR No results for input(s): LABPROT, INR in the last 72 hours. CMP     Component Value Date/Time   NA 138 09/28/2021 0156   NA 136 07/19/2021 1324   K 4.6 09/28/2021 0156   CL 120 (H) 09/28/2021 0156   CO2 15 (L) 09/28/2021 0156   GLUCOSE 98 09/28/2021 0156   BUN 29 (H) 09/28/2021 0156   BUN 25 07/19/2021 1324   CREATININE 1.78 (H) 09/28/2021 0156   CREATININE 0.94 05/08/2016 0741   CALCIUM 8.1 (L) 09/28/2021 0156   PROT 4.7 (L) 09/27/2021 0349   PROT 5.5 (L) 07/19/2021 1324   ALBUMIN 2.4 (L) 09/27/2021 0349   ALBUMIN 3.4 (L) 07/19/2021 1324   AST 14 (L) 09/27/2021 0349   ALT 12 09/27/2021 0349    ALKPHOS 41 09/27/2021 0349   BILITOT 0.6 09/27/2021 0349   BILITOT <0.2 07/19/2021 1324   GFRNONAA 32 (L) 09/28/2021 0156   GFRAA 77 12/18/2020 1548   Lipase     Component Value Date/Time   LIPASE 73 (H) 09/14/2021 0525    Studies/Results: No results found.  Anti-infectives: Anti-infectives (From admission, onward)    None        Assessment/Plan Crohn's disease S/p ileocecectomy and sigmoidectomy in 02/2021  High ileostomy output with AKI and electrolyte derangements  - Creatinine and electrolytes improved compared to admission labs.  - Cont Questran. On imodium 105m TID and fibercon + iron BID. Output improved - Seen by WOCN yesterday. Had some leakage overnight. I have asked her nurse to replace her ostomy bag.  - Suspect she will be able to be discharged soon. - Will ultimately need [facility](Dr. DEvalyn Casco  ID - none FEN - Reg diet, replete Mg. IVF 1548mhr  VTE - lovenox Foley - none   Anemia of chronic disease Anxiety/ depression GERD HTN UDS+ cocaine     LOS: 1 day    MiJillyn Ledger [facility] 09/28/2021, 10:06 AM Please see Amion for pager number during day  hours 7:00am-4:30pm

## 2021-09-28 NOTE — Plan of Care (Signed)
  Problem: Education: Goal: Knowledge of ostomy care will improve Outcome: Progressing Goal: Understanding of discharge needs will improve Outcome: Progressing   Problem: Bowel/Gastric/Urinary: Goal: Gastrointestinal status for postoperative course will improve Outcome: Progressing   Problem: Coping: Goal: Coping ability will improve Outcome: Progressing   Problem: Fluid Volume: Goal: Ability to achieve a balanced intake and output will improve Outcome: Progressing   Problem: Health Behavior/Discharge Planning: Goal: Ability to manage health-related needs will improve Outcome: Progressing   Problem: Nutrition: Goal: Will attain and maintain optimal nutritional status will improve Outcome: Progressing   Problem: Clinical Measurements: Goal: Postoperative complications will be avoided or minimized Outcome: Progressing   Problem: Skin Integrity: Goal: Will show signs of wound healing Outcome: Progressing Goal: Risk for impaired skin integrity will decrease Outcome: Progressing

## 2021-09-29 LAB — BASIC METABOLIC PANEL
Anion gap: 3 — ABNORMAL LOW (ref 5–15)
BUN: 31 mg/dL — ABNORMAL HIGH (ref 8–23)
CO2: 14 mmol/L — ABNORMAL LOW (ref 22–32)
Calcium: 7.9 mg/dL — ABNORMAL LOW (ref 8.9–10.3)
Chloride: 118 mmol/L — ABNORMAL HIGH (ref 98–111)
Creatinine, Ser: 1.56 mg/dL — ABNORMAL HIGH (ref 0.44–1.00)
GFR, Estimated: 38 mL/min — ABNORMAL LOW (ref >60)
Glucose, Bld: 120 mg/dL — ABNORMAL HIGH (ref 70–99)
Potassium: 5 mmol/L (ref 3.5–5.1)
Sodium: 135 mmol/L (ref 135–145)

## 2021-09-29 MED ORDER — FERROUS SULFATE 325 (65 FE) MG PO TABS: 325.0000 mg | ORAL_TABLET | Freq: Two times a day (BID) | ORAL | 0 refills | Status: DC

## 2021-09-29 MED ORDER — CALCIUM POLYCARBOPHIL 625 MG PO TABS: 625.0000 mg | ORAL_TABLET | Freq: Two times a day (BID) | ORAL | 0 refills | Status: DC

## 2021-09-29 MED ORDER — ZINC OXIDE 12.8 % EX OINT: TOPICAL_OINTMENT | CUTANEOUS | 0 refills | Status: DC | PRN

## 2021-09-29 MED ORDER — OXYCODONE HCL 5 MG PO TABS: 5.0000 mg | ORAL_TABLET | Freq: Four times a day (QID) | ORAL | 0 refills | Status: DC | PRN

## 2021-09-29 MED ORDER — LOPERAMIDE HCL 2 MG PO CAPS
2.0000 mg | ORAL_CAPSULE | Freq: Three times a day (TID) | ORAL | 0 refills | Status: DC
Start: 2021-09-29 — End: 2021-10-26

## 2021-09-29 NOTE — Plan of Care (Signed)
  Problem: Education: Goal: Knowledge of ostomy care will improve Outcome: Adequate for Discharge Goal: Understanding of discharge needs will improve Outcome: Adequate for Discharge   Problem: Bowel/Gastric/Urinary: Goal: Gastrointestinal status for postoperative course will improve Outcome: Adequate for Discharge   Problem: Coping: Goal: Coping ability will improve Outcome: Adequate for Discharge   Problem: Fluid Volume: Goal: Ability to achieve a balanced intake and output will improve Outcome: Adequate for Discharge   Problem: Health Behavior/Discharge Planning: Goal: Ability to manage health-related needs will improve Outcome: Adequate for Discharge   Problem: Nutrition: Goal: Will attain and maintain optimal nutritional status will improve Outcome: Adequate for Discharge   Problem: Clinical Measurements: Goal: Postoperative complications will be avoided or minimized Outcome: Adequate for Discharge   Problem: Skin Integrity: Goal: Will show signs of wound healing Outcome: Adequate for Discharge Goal: Risk for impaired skin integrity will decrease Outcome: Adequate for Discharge

## 2021-09-29 NOTE — Discharge Instructions (Addendum)
Please monitor ileostomy output at home. We would like for your output to be less than 105m (1 liter) in a day. Please take imodium, fibercon and iron as prescribed. Call with any questions or concerns.

## 2021-09-29 NOTE — Progress Notes (Signed)
Order to discharge pt home.  Discharge instructions/AVS given to patient and reviewed - education provided as needed.  Pt sent home with several days worth of ileostomy supplies.  Pt advised to call PCP and/or come back to the hospital if there are any problems. Pt verbalized understanding.

## 2021-09-29 NOTE — Progress Notes (Signed)
CSW contacted by RN for ride assistance home.  Ride arranged throught [facility]. Lurline Idol, MSW, LCSW 11/27/20223:44 PM

## 2021-09-29 NOTE — Discharge Summary (Signed)
Patient ID: Cynthia [name] [id number] 03-19-1961 60 y.o.  Admit date: 09/26/2021 Discharge date: 09/29/2021  Admitting Diagnosis: AKI with high output ileostomy  Discharge Diagnosis High output ileostomy  Consultants None  Reason for Admission: 60 yo female with Crohn's underwent ileocecectomy and sigmoidectomy in 02/2021 by Dr. Hilliard Clark at [place]. Since then she had has intermittent issues with ostomy output. She has had multiple AKI ED visits and multiple issues with pouching and skin breakdown. She was following with [facility] but due to multiple no shows she was discharged from the practice.   She states she called [facility] 11/23 to try to get follow up with them but they were not able to schedule due to the holiday. She has not taken anything to slow the output down and states that is why she was reaching out to the GI group. Other than this output issue she denies other Crohn's flares since surgery and is not on any chronic medication for Crohn's.  Procedures None  Hospital Course:  Patient was admitted to the general surgery service for high output ileostomy with AKI. She was started on IVF. She was noted to have skin erythema/ skin excoriation around the stoma from suspected leakage. WOCN was consulted and assisted with pouching instructions/teaching for patient. She was started on imodium 55m TID and fibercon + iron BID. Output improved and patient had no further leakage from ileostomy on day of discharge. The area of excoriation around ostomy significantly improved and patient felt her pain was well controlled. On 11/27, the patient was voiding well, tolerating diet, ambulating well, pain well controlled, vital signs stable, and felt stable for discharge home. I asked the patient's RN to ensure she has some ostomy supplies to go home with. I have referred her to ostomy clinic as well. [facility]   Physical Exam: Gen:  Alert,  NAD Cards: reg Pulm: CTA b/l, normal rate and effort  Abd: soft, ND, ileostomy pink and protruding with semi-solid mixed with liquid stool in bag. No leakage. Her surrounding erythema/ skin excoriation has greatly improved. NT on exam.  Skin: no rashes noted, warm and dry  Allergies as of 09/29/2021       Reactions   Iodine Other (See Comments)   The patient said she had a reaction from iodine in some eye drops- caused eyes to turn red;  pt has no reactions to CT contrast        Medication List     STOP taking these medications    HYDROcodone-acetaminophen 5-325 MG tablet Commonly known as: NORCO/VICODIN   ibuprofen 200 MG tablet Commonly known as: ADVIL       TAKE these medications    acetaminophen 500 MG tablet Commonly known as: TYLENOL Take 1,000 mg by mouth every 6 (six) hours as needed for headache or mild pain.   cholestyramine 4 g packet Commonly known as: QUESTRAN Take 1 packet (4 g total) by mouth 3 (three) times daily. What changed: when to take this   ferrous sulfate 325 (65 FE) MG tablet Take 1 tablet (325 mg total) by mouth 2 (two) times daily with a meal.   loperamide 2 MG capsule Commonly known as: IMODIUM Take 1 capsule (2 mg total) by mouth 3 (three) times daily.   oxyCODONE 5 MG immediate release tablet Commonly known as: Oxy IR/ROXICODONE Take 1 tablet (5 mg total) by mouth every 6 (six) hours as needed for breakthrough  pain.   polycarbophil 625 MG tablet Commonly known as: FIBERCON Take 1 tablet (625 mg total) by mouth 2 (two) times daily.   Zinc Oxide 12.8 % ointment Commonly known as: TRIPLE PASTE Apply topically as needed for irritation.         Follow-up Information     Rexene Agent, MD. Call.   Specialty: Colon and Rectal Surgery Why: Please call to make a follow up appointment with your surgeon Contact information: [address] [place] [address] [phone]         Surgery, [place]. Call.   Specialty: General Surgery Why: As needed Contact information: [address] [place] [address] [phone]         Denita Lung, MD Follow up.   Specialty: Family Medicine Why: Repeat labs in 1-2 weeks. You were found to have an acute kidney injury on admission Contact information: [place] [address] [phone]                   Signed: Alferd Apa, [facility] 09/29/2021, 11:28 AM Please see Amion for pager number during day hours 7:00am-4:30pm

## 2021-10-03 ENCOUNTER — Emergency Department (HOSPITAL_COMMUNITY)
Admission: EM | Admit: 2021-10-03 | Discharge: 2021-10-03 | Disposition: A | Discharge status: Home / Self Care | Payer: Medicaid Other | Attending: Student | Admitting: Student

## 2021-10-03 ENCOUNTER — Other Ambulatory Visit: Payer: Self-pay

## 2021-10-03 DIAGNOSIS — F17210 Nicotine dependence, cigarettes, uncomplicated: Secondary | ICD-10-CM | POA: Diagnosis not present

## 2021-10-03 DIAGNOSIS — Z8616 Personal history of COVID-19: Secondary | ICD-10-CM | POA: Diagnosis not present

## 2021-10-03 DIAGNOSIS — Z8719 Personal history of other diseases of the digestive system: Secondary | ICD-10-CM | POA: Diagnosis not present

## 2021-10-03 DIAGNOSIS — L03311 Cellulitis of abdominal wall: Secondary | ICD-10-CM | POA: Insufficient documentation

## 2021-10-03 DIAGNOSIS — K219 Gastro-esophageal reflux disease without esophagitis: Secondary | ICD-10-CM | POA: Diagnosis not present

## 2021-10-03 DIAGNOSIS — R238 Other skin changes: Secondary | ICD-10-CM

## 2021-10-03 DIAGNOSIS — Z79899 Other long term (current) drug therapy: Secondary | ICD-10-CM | POA: Diagnosis not present

## 2021-10-03 DIAGNOSIS — Z9049 Acquired absence of other specified parts of digestive tract: Secondary | ICD-10-CM | POA: Insufficient documentation

## 2021-10-03 DIAGNOSIS — L0390 Cellulitis, unspecified: Secondary | ICD-10-CM

## 2021-10-03 DIAGNOSIS — I10 Essential (primary) hypertension: Secondary | ICD-10-CM | POA: Insufficient documentation

## 2021-10-03 MED ORDER — OXYCODONE-ACETAMINOPHEN 5-325 MG PO TABS
2.0000 | ORAL_TABLET | Freq: Once | ORAL | Status: AC
  Administered 2021-10-03: 2 via ORAL
  Filled 2021-10-03: qty 2

## 2021-10-03 MED ORDER — CEPHALEXIN 500 MG PO CAPS
500.0000 mg | ORAL_CAPSULE | Freq: Once | ORAL | Status: AC
  Administered 2021-10-03: 500 mg via ORAL
  Filled 2021-10-03: qty 1

## 2021-10-03 MED ORDER — CEPHALEXIN 500 MG PO CAPS: 500.0000 mg | ORAL_CAPSULE | Freq: Four times a day (QID) | ORAL | 0 refills | Status: DC

## 2021-10-03 NOTE — ED Notes (Signed)
Applied temporary ileostomy bag in the patient. Ileostomy unable to stick in the skin d/t skin breaking down and weeping.

## 2021-10-03 NOTE — ED Triage Notes (Signed)
Patient BIB EMS from home c/o burning pain around stoma. Per report pt was out of ileostomy bag since this morning. Per report bag was been leaking and she got bad skin breakdown around her stoma.

## 2021-10-03 NOTE — ED Provider Notes (Signed)
[facility] DEPT Provider Note   CSN: [id number] Arrival date & time: 10/03/21  2044     History No chief complaint on file.   Cynthia [name] is a 60 y.o. female with PMH Crohn's status post ileocecectomy and sigmoidectomy with frequent ER visits for skin excoriation due to inadequate ostomy care and skin irritation from bowel contents on the abdominal wall who presents emergency department for similar complaints.  She states that she ran out of ostomy supplies and due to her high ostomy output came to emergency department for evaluation.  She does have erythema over the abdominal wall that appears to be improved from previous but this extends down into the inner thigh.  May contents were seen on the inner thigh on arrival.  She denies vomiting, chest pain, shortness of breath, fever or other systemic symptoms.  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[place]) 09/26/2021   Acute renal failure (ARF) ([place]) 08/12/2021   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[place])    Protein-calorie malnutrition, severe 07/11/2021   Hypotension 07/10/2021   High anion gap metabolic acidosis 93/79/0240   Acute renal failure, unspecified acute renal failure type ([place]) 07/10/2021   Shock circulatory (H[facility])    Nausea and vomiting 06/26/2021   Abdominal pain 06/26/2021   Sepsis ([place]) 06/26/2021   Sepsis due to cellulitis ([place]) 04/05/2021   AKI (acute kidney injury) ([place]) 04/05/2021   Hyperkalemia 04/05/2021   Hyponatremia  04/05/2021   Adjustment disorder 03/14/2021   Unspecified severe protein-calorie malnutrition ([place]) 03/13/2021   COVID-19 01/25/2021   Leukocytosis 01/24/2021   Crohn's colitis, with intestinal obstruction ([place]) 01/24/2021   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   History of rectal polyp 2020 01/12/2021   Anxiety associated with depression 01/12/2021   Hypokalemia 12/04/2020   Cough 11/27/2020   Otalgia of both ears 11/27/2020   Body aches 11/27/2020   Fever 11/27/2020   High risk medication use 11/27/2020   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[place]) 06/27/2020   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   Crohn's ileitis, with intestinal obstruction ([place]) 10/05/2019   Colitis    Rectal polyp    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   RLS (restless legs syndrome) 09/25/2014   Current smoker 09/19/2011   Migraine headache 09/19/2011   Essential hypertension 09/19/2011   Obesity (BMI 30-39.9) 09/19/2011   History of serrated colonic polyp 01/13/2011   Gastroesophageal reflux disease 03/17/2008   MVP (mitral valve prolapse) 03/17/2008   HIATAL HERNIA 05/03/2002    Past Surgical History:  Procedure Laterality Date   ABDOMINAL HYSTERECTOMY  06/17/2011   Procedure: HYSTERECTOMY ABDOMINAL;  Surgeon: Arloa Koh;  Location: [facility];  Service: Gynecology;  Laterality: N/A;  Converted to Abdominal Hysterectomy with lysis of adhesions    ANTERIOR  AND POSTERIOR REPAIR  02/25/2012   Procedure: ANTERIOR (CYSTOCELE) AND POSTERIOR REPAIR (RECTOCELE);  Surgeon: Daria Pastures, MD;  Location: [facility];  Service: Gynecology;  Laterality: N/A;  ANterior cystocele repair ONLY   BIOPSY  08/29/2019   Procedure: BIOPSY;  Surgeon: Lavena Bullion, DO;  Location: [facility];  Service: Gastroenterology;;   BLADDER SUSPENSION  02/25/2012   Procedure: TRANSVAGINAL TAPE (TVT) PROCEDURE;   Surgeon: Daria Pastures, MD;  Location: [facility];  Service: Gynecology;  Laterality: N/A;   COLONOSCOPY WITH PROPOFOL N/A 08/29/2019   Procedure: COLONOSCOPY WITH PROPOFOL;  Surgeon: Lavena Bullion, DO;  Location: [facility];  Service: Gastroenterology;  Laterality: N/A;   CYSTOSCOPY  02/25/2012   Procedure: CYSTOSCOPY;  Surgeon: Daria Pastures, MD;  Location: [facility];  Service: Gynecology;  Laterality: N/A;   ILEOSTOMY     KNEE ARTHROSCOPY  2010   LAPAROSCOPIC ASSISTED VAGINAL HYSTERECTOMY  06/17/2011   Procedure: LAPAROSCOPIC ASSISTED VAGINAL HYSTERECTOMY;  Surgeon: Arloa Koh;  Location: [facility];  Service: Gynecology;  Laterality: N/A;  Attempted    POLYPECTOMY  08/29/2019   Procedure: POLYPECTOMY;  Surgeon: Lavena Bullion, DO;  Location: [facility];  Service: Gastroenterology;;   SALPINGOOPHORECTOMY  06/17/2011   Procedure: SALPINGO OOPHERECTOMY;  Surgeon: Arloa Koh;  Location: [facility];  Service: Gynecology;  Laterality: Bilateral;   TONSILLECTOMY       OB History   No obstetric history on file.     Family History  Problem Relation Age of Onset   Diabetes Mother    Dementia Mother    Clotting disorder Father    Macular degeneration Father    Crohn's disease Sister    Colon cancer Neg Hx    Esophageal cancer Neg Hx    Pancreatic cancer Neg Hx    Stomach cancer Neg Hx    Liver disease Neg Hx     Social History   Tobacco Use   Smoking status: Every Day    Packs/day: 0.00    Types: Cigarettes   Smokeless tobacco: Never   Tobacco comments:    none in 2 weeks as of 12/04/20  Vaping Use   Vaping Use: Some days  Substance Use Topics   Alcohol use: Not Currently   Drug use: Not Currently    Types: Cocaine    Comment: clean x 8 weeks    Home Medications Prior to Admission medications   Medication Sig Start Date End Date Taking? Authorizing Provider  cephALEXin (KEFLEX) 500 MG capsule Take 1 capsule (500 mg total) by mouth 4 (four) times daily.  10/03/21  Yes Jacklin Zwick, MD  acetaminophen (TYLENOL) 500 MG tablet Take 1,000 mg by mouth every 6 (six) hours as needed for headache or mild pain.    [provider]  cholestyramine (QUESTRAN) 4 g packet Take 1 packet (4 g total) by mouth 3 (three) times daily. Patient taking differently: Take 4 g by mouth every Monday, Wednesday, and Friday. 08/20/21 09/26/21  Barb Merino, MD  ferrous sulfate 325 (65 FE) MG tablet Take 1 tablet (325 mg total) by mouth 2 (two) times daily with a meal. 09/29/21   Maczis, Barth Kirks, PA-C  loperamide (IMODIUM) 2 MG capsule Take 1 capsule (2 mg total) by mouth 3 (three) times daily. 09/29/21   Maczis, Barth Kirks, PA-C  oxyCODONE (OXY IR/ROXICODONE) 5 MG immediate release tablet Take 1 tablet (5 mg total) by mouth every 6 (six) hours as needed for breakthrough pain. 09/29/21  Maczis, Barth Kirks, PA-C  polycarbophil (FIBERCON) 625 MG tablet Take 1 tablet (625 mg total) by mouth 2 (two) times daily. 09/29/21   Maczis, Barth Kirks, PA-C  Zinc Oxide (TRIPLE PASTE) 12.8 % ointment Apply topically as needed for irritation. 09/29/21   Maczis, Barth Kirks, PA-C    Allergies    Iodine  Review of Systems   Review of Systems  Constitutional:  Negative for chills and fever.  HENT:  Negative for ear pain and sore throat.   Eyes:  Negative for pain and visual disturbance.  Respiratory:  Negative for cough and shortness of breath.   Cardiovascular:  Negative for chest pain and palpitations.  Gastrointestinal:  Negative for abdominal pain and vomiting.  Genitourinary:  Negative for dysuria and hematuria.  Musculoskeletal:  Negative for arthralgias and back pain.  Skin:  Positive for rash and wound. Negative for color change.  Neurological:  Negative for seizures and syncope.  All other systems reviewed and are negative.  Physical Exam Updated Vital Signs BP 125/77 (BP Location: Right Arm)   Pulse 80   Temp 98 F (36.7 C)   Resp 20   Ht 5' 4"  (1.626 m)   Wt  54 kg   LMP 06/10/2011 (Exact Date)   SpO2 99%   BMI 20.43 kg/m   Physical Exam Vitals and nursing note reviewed.  Constitutional:      General: She is not in acute distress.    Appearance: She is well-developed.  HENT:     Head: Normocephalic and atraumatic.  Eyes:     Conjunctiva/sclera: Conjunctivae normal.  Cardiovascular:     Rate and Rhythm: Normal rate and regular rhythm.     Heart sounds: No murmur heard. Pulmonary:     Effort: Pulmonary effort is normal. No respiratory distress.     Breath sounds: Normal breath sounds.  Abdominal:     Palpations: Abdomen is soft.     Tenderness: There is no abdominal tenderness.  Musculoskeletal:        General: No swelling.     Cervical back: Neck supple.  Skin:    General: Skin is warm and dry.     Capillary Refill: Capillary refill takes less than 2 seconds.     Findings: Erythema (Similar to previous, abdominal wall, area of erythema tracking extends into the right medial thigh, skip lesions at the skin folds) present.  Neurological:     Mental Status: She is alert.  Psychiatric:        Mood and Affect: Mood normal.    ED Results / Procedures / Treatments   Labs (all labs ordered are listed, but only abnormal results are displayed) Labs Reviewed - No data to display  EKG None  Radiology No results found.  Procedures Procedures   Medications Ordered in ED Medications  cephALEXin (KEFLEX) capsule 500 mg (500 mg Oral Given 10/03/21 2207)  oxyCODONE-acetaminophen (PERCOCET/ROXICET) 5-325 MG per tablet 2 tablet (2 tablets Oral Given 10/03/21 2207)    ED Course  I have reviewed the triage vital signs and the nursing notes.  Pertinent labs & imaging results that were available during my care of the patient were reviewed by me and considered in my medical decision making (see chart for details).    MDM Rules/Calculators/A&P                           Patient seen emergency department for an ostomy problem after  running out  of ostomy supplies.  Physical exam reveals erythema around the ostomy site over the abdominal wall that is improved from previous presentations.  She also has erythema that extends into the medial thigh but there is a skip lesion at the area of skin fold more concerning for local irritation from GI contents then cellulitis.  However, as this is new from previous, we will trial a short course of Keflex.  She was given pain medication here in the emergency department and her ostomy site was changed.  She was given additional ostomy supplies.  I sent a message to her primary care physician as it seems the patient is very much struggling with coordinating resources in the outpatient setting.  Patient then discharged. Final Clinical Impression(s) / ED Diagnoses Final diagnoses:  Skin irritation  Cellulitis, unspecified cellulitis site    Rx / DC Orders ED Discharge Orders          Ordered    cephALEXin (KEFLEX) 500 MG capsule  4 times daily        10/03/21 2215             Teressa Lower, MD 10/03/21 2228

## 2021-10-04 ENCOUNTER — Other Ambulatory Visit: Payer: Self-pay

## 2021-10-04 ENCOUNTER — Telehealth: Payer: Self-pay

## 2021-10-04 DIAGNOSIS — K50013 Crohn's disease of small intestine with fistula: Secondary | ICD-10-CM

## 2021-10-04 NOTE — Telephone Encounter (Signed)
Transition Care Management Unsuccessful Follow-up Telephone Call  Date of discharge and from where:  [facility] 10/03/21  Attempts:  1st Attempt  Reason for unsuccessful TCM follow-up call:  Unable to leave message

## 2021-10-17 ENCOUNTER — Other Ambulatory Visit: Payer: Self-pay

## 2021-10-17 ENCOUNTER — Inpatient Hospital Stay (HOSPITAL_COMMUNITY)
Admission: EM | Admit: 2021-10-17 | Discharge: 2021-10-27 | DRG: 682 | Disposition: A | Discharge status: Home / Self Care | Payer: Medicaid Other | Attending: Family Medicine | Admitting: Family Medicine

## 2021-10-17 ENCOUNTER — Encounter (HOSPITAL_COMMUNITY): Payer: Self-pay

## 2021-10-17 DIAGNOSIS — I129 Hypertensive chronic kidney disease with stage 1 through stage 4 chronic kidney disease, or unspecified chronic kidney disease: Secondary | ICD-10-CM | POA: Diagnosis present

## 2021-10-17 DIAGNOSIS — E539 Vitamin B deficiency, unspecified: Secondary | ICD-10-CM

## 2021-10-17 DIAGNOSIS — E872 Acidosis, unspecified: Secondary | ICD-10-CM | POA: Diagnosis present

## 2021-10-17 DIAGNOSIS — Z932 Ileostomy status: Secondary | ICD-10-CM

## 2021-10-17 DIAGNOSIS — K219 Gastro-esophageal reflux disease without esophagitis: Secondary | ICD-10-CM | POA: Diagnosis present

## 2021-10-17 DIAGNOSIS — E871 Hypo-osmolality and hyponatremia: Secondary | ICD-10-CM | POA: Diagnosis present

## 2021-10-17 DIAGNOSIS — N1831 Chronic kidney disease, stage 3a: Secondary | ICD-10-CM | POA: Diagnosis present

## 2021-10-17 DIAGNOSIS — F17210 Nicotine dependence, cigarettes, uncomplicated: Secondary | ICD-10-CM | POA: Diagnosis present

## 2021-10-17 DIAGNOSIS — D649 Anemia, unspecified: Secondary | ICD-10-CM

## 2021-10-17 DIAGNOSIS — Y833 Surgical operation with formation of external stoma as the cause of abnormal reaction of the patient, or of later complication, without mention of misadventure at the time of the procedure: Secondary | ICD-10-CM | POA: Diagnosis present

## 2021-10-17 DIAGNOSIS — K5000 Crohn's disease of small intestine without complications: Secondary | ICD-10-CM | POA: Diagnosis present

## 2021-10-17 DIAGNOSIS — N179 Acute kidney failure, unspecified: Principal | ICD-10-CM | POA: Diagnosis present

## 2021-10-17 DIAGNOSIS — R198 Other specified symptoms and signs involving the digestive system and abdomen: Principal | ICD-10-CM

## 2021-10-17 DIAGNOSIS — F418 Other specified anxiety disorders: Secondary | ICD-10-CM | POA: Diagnosis present

## 2021-10-17 DIAGNOSIS — E875 Hyperkalemia: Secondary | ICD-10-CM | POA: Diagnosis present

## 2021-10-17 DIAGNOSIS — K30 Functional dyspepsia: Secondary | ICD-10-CM

## 2021-10-17 DIAGNOSIS — D631 Anemia in chronic kidney disease: Secondary | ICD-10-CM | POA: Diagnosis present

## 2021-10-17 DIAGNOSIS — E43 Unspecified severe protein-calorie malnutrition: Secondary | ICD-10-CM | POA: Diagnosis present

## 2021-10-17 DIAGNOSIS — Z6820 Body mass index (BMI) 20.0-20.9, adult: Secondary | ICD-10-CM

## 2021-10-17 DIAGNOSIS — L24B3 Irritant contact dermatitis related to fecal or urinary stoma or fistula: Secondary | ICD-10-CM | POA: Diagnosis present

## 2021-10-17 DIAGNOSIS — Z20822 Contact with and (suspected) exposure to covid-19: Secondary | ICD-10-CM | POA: Diagnosis present

## 2021-10-17 DIAGNOSIS — K9413 Enterostomy malfunction: Secondary | ICD-10-CM | POA: Diagnosis present

## 2021-10-17 DIAGNOSIS — E860 Dehydration: Secondary | ICD-10-CM | POA: Diagnosis present

## 2021-10-17 DIAGNOSIS — Z888 Allergy status to other drugs, medicaments and biological substances status: Secondary | ICD-10-CM

## 2021-10-17 DIAGNOSIS — I10 Essential (primary) hypertension: Secondary | ICD-10-CM

## 2021-10-17 DIAGNOSIS — I341 Nonrheumatic mitral (valve) prolapse: Secondary | ICD-10-CM | POA: Diagnosis present

## 2021-10-17 DIAGNOSIS — Z79899 Other long term (current) drug therapy: Secondary | ICD-10-CM

## 2021-10-17 LAB — URINALYSIS, ROUTINE W REFLEX MICROSCOPIC
Bacteria, UA: NONE SEEN
Bilirubin Urine: NEGATIVE
Glucose, UA: NEGATIVE mg/dL
Hgb urine dipstick: NEGATIVE
Ketones, ur: NEGATIVE mg/dL
Leukocytes,Ua: NEGATIVE
Nitrite: NEGATIVE
Protein, ur: 30 mg/dL — AB
Specific Gravity, Urine: 1.016 (ref 1.005–1.030)
pH: 5 (ref 5.0–8.0)

## 2021-10-17 LAB — CBC WITH DIFFERENTIAL/PLATELET
Abs Immature Granulocytes: 0.04 10*3/uL (ref 0.00–0.07)
Basophils Absolute: 0 10*3/uL (ref 0.0–0.1)
Basophils Relative: 0 %
Eosinophils Absolute: 0.4 10*3/uL (ref 0.0–0.5)
Eosinophils Relative: 3 %
HCT: 34.5 % — ABNORMAL LOW (ref 36.0–46.0)
Hemoglobin: 11 g/dL — ABNORMAL LOW (ref 12.0–15.0)
Immature Granulocytes: 0 %
Lymphocytes Relative: 16 %
Lymphs Abs: 1.7 10*3/uL (ref 0.7–4.0)
MCH: 30.6 pg (ref 26.0–34.0)
MCHC: 31.9 g/dL (ref 30.0–36.0)
MCV: 96.1 fL (ref 80.0–100.0)
Monocytes Absolute: 0.8 10*3/uL (ref 0.1–1.0)
Monocytes Relative: 7 %
Neutro Abs: 8.2 10*3/uL — ABNORMAL HIGH (ref 1.7–7.7)
Neutrophils Relative %: 74 %
Platelets: 350 10*3/uL (ref 150–400)
RBC: 3.59 MIL/uL — ABNORMAL LOW (ref 3.87–5.11)
RDW: 13.2 % (ref 11.5–15.5)
WBC: 11.2 10*3/uL — ABNORMAL HIGH (ref 4.0–10.5)
nRBC: 0 % (ref 0.0–0.2)

## 2021-10-17 LAB — COMPREHENSIVE METABOLIC PANEL
ALT: 17 U/L (ref 0–44)
AST: 30 U/L (ref 15–41)
Albumin: 4.1 g/dL (ref 3.5–5.0)
Alkaline Phosphatase: 62 U/L (ref 38–126)
Anion gap: 7 (ref 5–15)
BUN: 59 mg/dL — ABNORMAL HIGH (ref 8–23)
CO2: 15 mmol/L — ABNORMAL LOW (ref 22–32)
Calcium: 9.1 mg/dL (ref 8.9–10.3)
Chloride: 111 mmol/L (ref 98–111)
Creatinine, Ser: 1.96 mg/dL — ABNORMAL HIGH (ref 0.44–1.00)
GFR, Estimated: 29 mL/min — ABNORMAL LOW (ref >60)
Glucose, Bld: 97 mg/dL (ref 70–99)
Potassium: 5.3 mmol/L — ABNORMAL HIGH (ref 3.5–5.1)
Sodium: 133 mmol/L — ABNORMAL LOW (ref 135–145)
Total Bilirubin: 1 mg/dL (ref 0.3–1.2)
Total Protein: 7.7 g/dL (ref 6.5–8.1)

## 2021-10-17 LAB — RESP PANEL BY RT-PCR (FLU A&B, COVID) ARPGX2
Influenza A by PCR: NEGATIVE
Influenza B by PCR: NEGATIVE
SARS Coronavirus 2 by RT PCR: NEGATIVE

## 2021-10-17 MED ORDER — LACTATED RINGERS IV BOLUS
1000.0000 mL | Freq: Once | INTRAVENOUS | Status: AC
  Administered 2021-10-17: 1000 mL via INTRAVENOUS

## 2021-10-17 MED ORDER — CHOLESTYRAMINE 4 G PO PACK: 4.0000 g | PACK | Freq: Three times a day (TID) | ORAL | Status: DC

## 2021-10-17 MED ORDER — ACETAMINOPHEN 325 MG PO TABS
650.0000 mg | ORAL_TABLET | Freq: Four times a day (QID) | ORAL | Status: DC | PRN
  Administered 2021-10-18 (×2): 650 mg via ORAL
  Filled 2021-10-17 (×2): qty 2

## 2021-10-17 MED ORDER — ONDANSETRON HCL 4 MG/2ML IJ SOLN: 4.0000 mg | Freq: Four times a day (QID) | INTRAMUSCULAR | Status: DC | PRN

## 2021-10-17 MED ORDER — ONDANSETRON HCL 4 MG PO TABS: 4.0000 mg | ORAL_TABLET | Freq: Four times a day (QID) | ORAL | Status: DC | PRN

## 2021-10-17 MED ORDER — LACTATED RINGERS IV SOLN: INTRAVENOUS | Status: DC

## 2021-10-17 MED ORDER — CALCIUM POLYCARBOPHIL 625 MG PO TABS
625.0000 mg | ORAL_TABLET | Freq: Two times a day (BID) | ORAL | Status: DC
  Administered 2021-10-17 – 2021-10-27 (×21): 625 mg via ORAL
  Filled 2021-10-17 (×22): qty 1

## 2021-10-17 MED ORDER — ENOXAPARIN SODIUM 30 MG/0.3ML IJ SOSY
30.0000 mg | PREFILLED_SYRINGE | INTRAMUSCULAR | Status: DC
  Administered 2021-10-17 – 2021-10-18 (×2): 30 mg via SUBCUTANEOUS
  Filled 2021-10-17 (×2): qty 0.3

## 2021-10-17 MED ORDER — FENTANYL CITRATE PF 50 MCG/ML IJ SOSY
25.0000 ug | PREFILLED_SYRINGE | Freq: Once | INTRAMUSCULAR | Status: AC
  Administered 2021-10-17: 25 ug via INTRAVENOUS
  Filled 2021-10-17: qty 1

## 2021-10-17 MED ORDER — DIPHENHYDRAMINE-ZINC ACETATE 2-0.1 % EX CREA
TOPICAL_CREAM | Freq: Three times a day (TID) | CUTANEOUS | Status: DC | PRN
  Administered 2021-10-26: 1 via TOPICAL
  Filled 2021-10-17 (×4): qty 28

## 2021-10-17 MED ORDER — ENOXAPARIN SODIUM 40 MG/0.4ML IJ SOSY: 40.0000 mg | PREFILLED_SYRINGE | INTRAMUSCULAR | Status: DC

## 2021-10-17 MED ORDER — LOPERAMIDE HCL 2 MG PO CAPS
4.0000 mg | ORAL_CAPSULE | Freq: Three times a day (TID) | ORAL | Status: DC
  Administered 2021-10-17 – 2021-10-18 (×3): 4 mg via ORAL
  Filled 2021-10-17 (×3): qty 2

## 2021-10-17 MED ORDER — VENLAFAXINE HCL ER 75 MG PO CP24
150.0000 mg | ORAL_CAPSULE | Freq: Every day | ORAL | Status: DC
  Administered 2021-10-18 – 2021-10-27 (×10): 150 mg via ORAL
  Filled 2021-10-17 (×10): qty 2

## 2021-10-17 MED ORDER — ACETAMINOPHEN 650 MG RE SUPP: 650.0000 mg | Freq: Four times a day (QID) | RECTAL | Status: DC | PRN

## 2021-10-17 MED ORDER — LIDOCAINE 4 % EX CREA
TOPICAL_CREAM | Freq: Four times a day (QID) | CUTANEOUS | Status: DC | PRN
  Filled 2021-10-17: qty 5

## 2021-10-17 NOTE — ED Provider Notes (Signed)
[facility] DEPT Provider Note   CSN: [id number] Arrival date & time: 10/17/21  0445     History Chief Complaint  Patient presents with   Colostomy Issue     Cynthia [name] is a 60 y.o. female presents to the emergency department for evaluation of high output of her ileostomy as well as irritation and redness of the surrounding skin.  Patient has been seen here multiple times over the past few months for the same issue.  Recently was on Keflex for skin irritation.  Original surgeon was Dr. Hilliard Clark with [facility]. She denies any fevers or bloody output from her stoma. Denies any dark output. She has still been drinking sugary drinks. She reports that she has to change her bag at least 3-4 times a day due to the high output. Ileostomy d/t Crohn's. Allergic to Iodine. Former smoker.   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[place])    MVP (mitral valve prolapse)    RLS (restless legs syndrome)    Tubular adenoma of colon 08/2019    Patient Active Problem List   Diagnosis Date Noted   High output ileostomy ([place]) 09/26/2021   Acute renal failure (ARF) ([place]) 08/12/2021   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[place])    Protein-calorie malnutrition, severe 07/11/2021   Hypotension 07/10/2021   High anion gap metabolic acidosis 26/94/8546   Acute renal failure, unspecified acute renal failure type ([place]) 07/10/2021   Shock circulatory (H[facility])    Nausea and vomiting 06/26/2021   Abdominal pain 06/26/2021   Sepsis ([place]) 06/26/2021   Sepsis due to cellulitis ([place]) 04/05/2021   AKI (acute kidney injury) ([place]) 04/05/2021   Hyperkalemia 04/05/2021   Hyponatremia 04/05/2021   Adjustment disorder  03/14/2021   Unspecified severe protein-calorie malnutrition ([place]) 03/13/2021   COVID-19 01/25/2021   Leukocytosis 01/24/2021   Crohn's colitis, with intestinal obstruction ([place]) 01/24/2021   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   History of rectal polyp 2020 01/12/2021   Anxiety associated with depression 01/12/2021   Hypokalemia 12/04/2020   Cough 11/27/2020   Otalgia of both ears 11/27/2020   Body aches 11/27/2020   Fever 11/27/2020   High risk medication use 11/27/2020   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[place]) 06/27/2020   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   Crohn's ileitis, with intestinal obstruction ([place]) 10/05/2019   Colitis    Rectal polyp    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   RLS (restless legs syndrome) 09/25/2014   Current smoker 09/19/2011   Migraine headache 09/19/2011   Essential hypertension 09/19/2011   Obesity (BMI 30-39.9) 09/19/2011   History of serrated colonic polyp 01/13/2011   Gastroesophageal reflux disease 03/17/2008   MVP (mitral valve prolapse) 03/17/2008   HIATAL HERNIA 05/03/2002    Past Surgical History:  Procedure Laterality Date   ABDOMINAL HYSTERECTOMY  06/17/2011   Procedure: HYSTERECTOMY ABDOMINAL;  Surgeon: Arloa Koh;  Location: [facility];  Service: Gynecology;  Laterality: N/A;  Converted to Abdominal Hysterectomy with lysis of adhesions  ANTERIOR AND POSTERIOR REPAIR  02/25/2012   Procedure: ANTERIOR (CYSTOCELE) AND POSTERIOR REPAIR (RECTOCELE);  Surgeon: Daria Pastures, MD;  Location: [facility];  Service: Gynecology;  Laterality: N/A;  ANterior cystocele repair ONLY   BIOPSY  08/29/2019   Procedure: BIOPSY;  Surgeon: Lavena Bullion, DO;  Location: [facility];  Service: Gastroenterology;;   BLADDER SUSPENSION  02/25/2012   Procedure: TRANSVAGINAL TAPE (TVT) PROCEDURE;  Surgeon: Daria Pastures, MD;   Location: [facility];  Service: Gynecology;  Laterality: N/A;   COLONOSCOPY WITH PROPOFOL N/A 08/29/2019   Procedure: COLONOSCOPY WITH PROPOFOL;  Surgeon: Lavena Bullion, DO;  Location: [facility];  Service: Gastroenterology;  Laterality: N/A;   CYSTOSCOPY  02/25/2012   Procedure: CYSTOSCOPY;  Surgeon: Daria Pastures, MD;  Location: [facility];  Service: Gynecology;  Laterality: N/A;   ILEOSTOMY     KNEE ARTHROSCOPY  2010   LAPAROSCOPIC ASSISTED VAGINAL HYSTERECTOMY  06/17/2011   Procedure: LAPAROSCOPIC ASSISTED VAGINAL HYSTERECTOMY;  Surgeon: Arloa Koh;  Location: [facility];  Service: Gynecology;  Laterality: N/A;  Attempted    POLYPECTOMY  08/29/2019   Procedure: POLYPECTOMY;  Surgeon: Lavena Bullion, DO;  Location: [facility];  Service: Gastroenterology;;   SALPINGOOPHORECTOMY  06/17/2011   Procedure: SALPINGO OOPHERECTOMY;  Surgeon: Arloa Koh;  Location: [facility];  Service: Gynecology;  Laterality: Bilateral;   TONSILLECTOMY       OB History   No obstetric history on file.     Family History  Problem Relation Age of Onset   Diabetes Mother    Dementia Mother    Clotting disorder Father    Macular degeneration Father    Crohn's disease Sister    Colon cancer Neg Hx    Esophageal cancer Neg Hx    Pancreatic cancer Neg Hx    Stomach cancer Neg Hx    Liver disease Neg Hx     Social History   Tobacco Use   Smoking status: Every Day    Packs/day: 0.00    Types: Cigarettes   Smokeless tobacco: Never   Tobacco comments:    none in 2 weeks as of 12/04/20  Vaping Use   Vaping Use: Some days  Substance Use Topics   Alcohol use: Not Currently   Drug use: Not Currently    Types: Cocaine    Comment: clean x 8 weeks    Home Medications Prior to Admission medications   Medication Sig Start Date End Date Taking? Authorizing Provider  acetaminophen (TYLENOL) 500 MG tablet Take 1,000 mg by mouth every 6 (six) hours as needed for headache or mild pain.    [provider]  cephALEXin (KEFLEX) 500 MG capsule Take 1 capsule (500 mg total) by mouth 4 (four) times daily. 10/03/21   Kommor, [place], MD  cholestyramine (QUESTRAN) 4 g packet Take 1 packet (4 g total) by mouth 3 (three) times daily. Patient taking differently: Take 4 g by mouth every Monday, Wednesday, and Friday. 08/20/21 09/26/21  Barb Merino, MD  ferrous sulfate 325 (65 FE) MG tablet Take 1 tablet (325 mg total) by mouth 2 (two) times daily with a meal. 09/29/21   Maczis, Barth Kirks, PA-C  loperamide (IMODIUM) 2 MG capsule Take 1 capsule (2 mg total) by mouth 3 (three) times daily. 09/29/21   Maczis, Barth Kirks, PA-C  oxyCODONE (OXY IR/ROXICODONE) 5 MG immediate release tablet Take 1 tablet (5 mg total) by mouth every 6 (six) hours as needed for breakthrough pain. 09/29/21  Maczis, Barth Kirks, PA-C  polycarbophil (FIBERCON) 625 MG tablet Take 1 tablet (625 mg total) by mouth 2 (two) times daily. 09/29/21   Maczis, Barth Kirks, PA-C  Zinc Oxide (TRIPLE PASTE) 12.8 % ointment Apply topically as needed for irritation. 09/29/21   Maczis, Barth Kirks, PA-C    Allergies    Iodine  Review of Systems   Review of Systems  Gastrointestinal:        Increased output and irritation to stoma  Skin:  Positive for color change and rash.  All other systems reviewed and are negative.  Physical Exam Updated Vital Signs BP (!) 87/64    Pulse 73    Temp 97.9 F (36.6 C) (Oral)    Resp 16    Ht 5\' 4"  (1.626 m)    Wt 54.4 kg    LMP 06/10/2011 (Exact Date)    SpO2 99%    BMI 20.60 kg/m   Physical Exam Vitals and nursing note reviewed.  Constitutional:      General: She is not in acute distress.    Appearance: Normal appearance. She is not toxic-appearing.  HENT:     Head: Normocephalic and atraumatic.  Eyes:     General: No scleral icterus. Cardiovascular:     Rate and Rhythm: Normal rate and regular rhythm.     Heart sounds: Murmur heard.  Pulmonary:     Effort: Pulmonary effort is normal. No  respiratory distress.     Breath sounds: Normal breath sounds.  Abdominal:     General: Abdomen is flat. Bowel sounds are normal.     Palpations: Abdomen is soft.     Tenderness: There is no abdominal tenderness. There is no guarding or rebound.     Comments: Stoma in the right upper quadrant of abdomen.  Red and friable in appearance.  Actively leaking stool.  Patient has surrounding erythema going down into her groin area.  No red streaking.  Excoriation marks noted.  Patient has like lichenification to the skin that would be underneath the bag, likely due to constant irritation. No fluctuance.  Musculoskeletal:        General: No deformity.     Cervical back: Normal range of motion.  Skin:    General: Skin is warm and dry.  Neurological:     General: No focal deficit present.     Mental Status: She is alert. Mental status is at baseline.     Motor: No weakness.    ED Results / Procedures / Treatments   Labs (all labs ordered are listed, but only abnormal results are displayed) Labs Reviewed  COMPREHENSIVE METABOLIC PANEL - Abnormal; Notable for the following components:      Result Value   Sodium 133 (*)    Potassium 5.3 (*)    CO2 15 (*)    BUN 59 (*)    Creatinine, Ser 1.96 (*)    GFR, Estimated 29 (*)    All other components within normal limits  CBC WITH DIFFERENTIAL/PLATELET - Abnormal; Notable for the following components:   WBC 11.2 (*)    RBC 3.59 (*)    Hemoglobin 11.0 (*)    HCT 34.5 (*)    Neutro Abs 8.2 (*)    All other components within normal limits  URINALYSIS, ROUTINE W REFLEX MICROSCOPIC - Abnormal; Notable for the following components:   Protein, ur 30 (*)    All other components within normal limits    EKG None  Radiology No results found.  Procedures Procedures   Medications Ordered in ED Medications  lactated ringers bolus 1,000 mL (has no administration in time range)  lactated ringers bolus 1,000 mL (0 mLs Intravenous Stopped 10/17/21  1303)    ED Course  I have reviewed the triage vital signs and the nursing notes.  Pertinent labs & imaging results that were available during my care of the patient were reviewed by me and considered in my medical decision making (see chart for details).  60 year old female with ileostomy from Crohn's presents emergency department for evaluation of increased output as well as increased redness and irritation to her abdomen.  Patient states he had at least twice a month for the past few months for this issu [place] to me, but mentioned to the general surgery PA that she takes Imodium 2 mg.  She has no abdominal tenderness, but there is obvious irritation and lichenification to her abdomen and skin underlying the bag.  No fluctuance.  Low concern for deep abscess.  Consult but also general surgery, Alferd Apa PA answered and evaluated at bedside. The patient is well-known to him and he is seen here frequently during her hospital stays.  He reports that she has been set up with outpatient ostomy care but is unable to present due to transportation problems.  Patient is noncompliant with medications.  She has been told repeatedly to follow-up with her surgeon at [facility].  I spoke to Philis Kendall, who is Dr. Ardath Sax PA at [facility], and she reported that the patient expressed to her that she would like to follow-up in [place] as it is closer to where she lives.  Ms. Yolanda Bonine recommended she can increase the Imodium to 4 mg up to 4 times daily.  Patient does not measure her outputs, however if her output is greater than 1000 mL they can add on immotile or Questran.  Additionally, she mentioned that she will reach back out to the patient to schedule a reevaluation appointment.  Additionally, I will put in a referral to [facility] as she has been discharged from [facility].  I reviewed and interpreted the patient's labs.  Urinalysis shows some protein otherwise no signs of UTI.  CBC shows mildly elevated white blood  cell count at 11.2 with a left shift.  Some anemia present with hemoglobin 11.0 however this is greatly improved from her previous CBC weeks ago at 8.3.  Possibly hemoconcentrated due to dehydration as her first creatinine has elevated to 1.96 from 1.56.  Mildly elevated potassium at 5.3.  Her BUN has doubled.  With this lab results in the setting of an AKI, will order fluid bolus.   During this time, the patient had repeated soft pressure, 1 L bolus ordered at this time again.  Will admit for AKI and for TOC for ostomy and further surgical follow-up.  Dr. Olevia Bowens to admit.    MDM Rules/Calculators/A&P                          Final Clinical Impression(s) / ED Diagnoses Final diagnoses:  High output ileostomy [facility])    Rx / DC Orders ED Discharge Orders          Ordered    Amb Referral to [facility]        10/17/21 1141             Sherrell Puller, [place] 10/17/21 1517    Valarie Merino, MD 10/18/21 [phone]

## 2021-10-17 NOTE — ED Triage Notes (Signed)
Pt BIB EMS, pt reports colostomy bag fell off tonight. Pt also endorses burning and inflammation around colostomy site.    EMS vitals: BP 148/92 HR 76 SPO2 100% RA  CBG 111

## 2021-10-17 NOTE — ED Notes (Signed)
Ambulatory to bathroom with steady gait

## 2021-10-17 NOTE — ED Notes (Signed)
Assisted patient into a clean gown-escorted patient to the restroom-patient tearful-offered reassurance

## 2021-10-17 NOTE — ED Notes (Signed)
Surgery and Wound RN at bedside to evaluate patient

## 2021-10-17 NOTE — ED Notes (Signed)
Patient reports unable to provide urine specimen.  Reports there is no urine in there.

## 2021-10-17 NOTE — Consult Note (Addendum)
[place] [facility] Nurse ostomy consult note Stoma type/location: RUQ ileostomy, high output. Severe irritant contact dermatitis.  Seen in the ED with CS PA Alferd Apa.  ICD-10 CM Codes for Irritant Dermatitis  L24B3 - Related to fecal or urinary stoma or fistula  Stomal assessment/size: 1 and 1/2 inches round, red raised. Lumen in center Peristomal assessment: erythema, induration, partial thickness skin loss extended to right groin Treatment options for stomal/peristomal skin: crusting technique employed: stoma powder dusted over area, brushed away excess. Powder is blotted with [place] no sting liquid barrier film.  This is repeated 3 times. Output: seedy, soft  brown effluent  Ostomy pouching: 1pc.convex pouch with skin barrier ring and belt Education provided:  See CCS PA's note:  Education about emptying, not taping pouch when it is leaking, discontinuing sugary drinks like sweet tea and soda in favor of water reinforced.   Orders provided for nursing to include supply ordering numbers: Pouch is [name] # [id number], skin barrier ring is [name] # [phone], belt is [name] # [id number], powder is [employer]. Enrolled patient in [place] program: No.    Supplies provided to bedside. Powder, pouches, rings, belt is on patient.  Door nursing team will not follow, but will remain available to this patient, the nursing and medical teams.  Please re-consult if needed. Thanks, Maudie Flakes, MSN, RN, [place], Arther Abbott  Pager# [phone]

## 2021-10-17 NOTE — Consult Note (Addendum)
Mena Jan 22, 1961  [id number].    Requesting MD: Dr. Dene Gentry; Sherrell Puller, PA-C Chief Complaint/Reason for Consult: Pouching issue, skin irritation  HPI: 60 yo female with Crohn's underwent ileocecectomy and sigmoidectomy in 02/2021 by Dr. Hilliard Clark at [facility]. Since then she had has been having issues with pouching, skin breakdown, and high output ileostomy. She has had multiple ED visits for this. She was admitted from 11/24 - 11/27 for AKI with high output. She was titrated to imodium 2mg  TID and fibercon + iron BID with improvement of output. WOCN consulted for recommendations with pouching/skin irritation. After discharge patient reports she has not been monitoring her output at home. She reports her ostomy bag will fill to 3/4 full at least 5x/day and need to be emptied. She continues to have leakage around her ostomy bag with increasing redness/irritation/excoriation around this. She has been taking  imodium 2mg  TID and fibercon BID at home. She has not been taking iron. She does drink sweet tea often but no other sugary drinks. She is tolerating a diet without n/v at home. No fevers. No abdominal pain except where skin irritation is located. She recently completed a course of keflex (stated 12/1) after an ED visit for the erythema that was difficult to discern if from irritation vs cellulitis per notes. She did not follow up with her surgeon after her most recent hospitalization. She has not been able to follow up at the ostomy clinic either because she does not drive and has difficulty with transportation. She currently is living in an apartment with a roommate. She is not on any chronic medication for Crohn's. She previously followed with [facility] but due to multiple no shows she was discharged from the practice. She has not established care with another GI practice.    ROS: Review of Systems  Constitutional:  Positive for chills. Negative for fever.  Gastrointestinal:   Positive for abdominal pain. Negative for constipation, nausea and vomiting.  Skin:        Skin redness/irritation  Psychiatric/Behavioral:  Positive for substance abuse (Marijuana).   All other systems reviewed and are negative.  Family History  Problem Relation Age of Onset   Diabetes Mother    Dementia Mother    Clotting disorder Father    Macular degeneration Father    Crohn's disease Sister    Colon cancer Neg Hx    Esophageal cancer Neg Hx    Pancreatic cancer Neg Hx    Stomach cancer Neg Hx    Liver disease Neg Hx     Past Medical History:  Diagnosis Date   Anemia 2012   Anxiety    Bronchitis    Crohn's ileitis ([place])    Depression    GERD (gastroesophageal reflux disease)    Headache(784.0)    MIGRAINE   Heart murmur    Hiatal hernia    Hypertension    IBS (irritable bowel syndrome)    Incontinence    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HCC)    MVP (mitral valve prolapse)    RLS (restless legs syndrome)    Tubular adenoma of colon 08/2019    Past Surgical History:  Procedure Laterality Date   ABDOMINAL HYSTERECTOMY  06/17/2011   Procedure: HYSTERECTOMY ABDOMINAL;  Surgeon: Arloa Koh;  Location: [facility];  Service: Gynecology;  Laterality: N/A;  Converted to Abdominal Hysterectomy with lysis of adhesions    ANTERIOR AND POSTERIOR REPAIR  02/25/2012   Procedure: ANTERIOR (CYSTOCELE) AND POSTERIOR REPAIR (RECTOCELE);  Surgeon: Daria Pastures, MD;  Location: [facility];  Service: Gynecology;  Laterality: N/A;  ANterior cystocele repair ONLY   BIOPSY  08/29/2019   Procedure: BIOPSY;  Surgeon: Lavena Bullion, DO;  Location: [facility];  Service: Gastroenterology;;   BLADDER SUSPENSION  02/25/2012   Procedure: TRANSVAGINAL TAPE (TVT) PROCEDURE;  Surgeon: Daria Pastures, MD;  Location: [facility];  Service: Gynecology;  Laterality: N/A;   COLONOSCOPY WITH PROPOFOL N/A 08/29/2019   Procedure: COLONOSCOPY WITH PROPOFOL;  Surgeon: Lavena Bullion, DO;  Location: [facility];  Service: Gastroenterology;  Laterality: N/A;   CYSTOSCOPY  02/25/2012   Procedure: CYSTOSCOPY;  Surgeon: Daria Pastures, MD;  Location: [facility];  Service: Gynecology;  Laterality: N/A;   ILEOSTOMY     KNEE ARTHROSCOPY  2010   LAPAROSCOPIC ASSISTED VAGINAL HYSTERECTOMY  06/17/2011   Procedure: LAPAROSCOPIC ASSISTED VAGINAL HYSTERECTOMY;  Surgeon: Arloa Koh;  Location: [facility];  Service: Gynecology;  Laterality: N/A;  Attempted    POLYPECTOMY  08/29/2019   Procedure: POLYPECTOMY;  Surgeon: Lavena Bullion, DO;  Location: [facility];  Service: Gastroenterology;;   SALPINGOOPHORECTOMY  06/17/2011   Procedure: SALPINGO OOPHERECTOMY;  Surgeon: Arloa Koh;  Location: [facility];  Service: Gynecology;  Laterality: Bilateral;   TONSILLECTOMY      Social History:  reports that she has been smoking cigarettes. She has never used smokeless tobacco. She reports that she does not currently use alcohol. She reports that she does not currently use drugs after having used the following drugs: Cocaine.  Allergies:  Allergies  Allergen Reactions   Iodine Other (See Comments)    The patient said she had a reaction from iodine in some eye drops- caused eyes to turn red;  pt has no reactions to CT contrast    (Not in a hospital admission)    Physical Exam: Blood pressure 101/61, pulse 63, temperature 97.9 F (36.6 C), temperature source Oral, resp. rate 16, height 5\' 4"  (1.626 m), weight 54.4 kg, last menstrual period 06/10/2011, SpO2 100 %. General: pleasant, WD/WN white female who is laying in bed in NAD HEENT: head is normocephalic, atraumatic.  Sclera are noninjected.  PERRL.  Ears and nose without any masses or lesions.  Mouth is pink and moist. Dentition fair Heart: regular, rate, and rhythm.  Normal s1,s2. No obvious murmurs, gallops, or rubs noted.  Palpable pedal pulses bilaterally  Lungs: CTAB, no wheezes, rhonchi, or rales noted.  Respiratory effort nonlabored Abd: Soft, ND,  +BS, ileostomy pink and protruding. There is liquid stool in ostomy bag. There is significant surrounding erythema/ skin excoriation along the right side of her abdomen extending to the right flank. There are no replacement pouching systems in the room. I have asked the EDP to notify me when WOCN arrives so I can fully examine the abdomen when they change the pouch. Would benefit from ordering pouching system ahead of time to have it present when they arrive. I have also messaged our wocn to let them know I would like to be present when they change her pouch.  MS: no BUE/BLE edema, calves soft and nontender Skin: warm and dry with no masses, lesions, or rashes Psych: A&Ox4 with an appropriate affect Neuro: cranial nerves grossly intact, equal strength in BUE/BLE bilaterally, normal speech, thought process intact, moves all extremities, gait not assessed   Results for orders placed or performed during the hospital encounter of 10/17/21 (from the past 48 hour(s))  Comprehensive metabolic panel  Status: Abnormal   Collection Time: 10/17/21  7:43 AM  Result Value Ref Range   Sodium 133 (L) 135 - 145 mmol/L   Potassium 5.3 (H) 3.5 - 5.1 mmol/L   Chloride 111 98 - 111 mmol/L   CO2 15 (L) 22 - 32 mmol/L   Glucose, Bld 97 70 - 99 mg/dL    Comment: Glucose reference range applies only to samples taken after fasting for at least 8 hours.   BUN 59 (H) 8 - 23 mg/dL   Creatinine, Ser 1.96 (H) 0.44 - 1.00 mg/dL   Calcium 9.1 8.9 - 10.3 mg/dL   Total Protein 7.7 6.5 - 8.1 g/dL   Albumin 4.1 3.5 - 5.0 g/dL   AST 30 15 - 41 U/L   ALT 17 0 - 44 U/L   Alkaline Phosphatase 62 38 - 126 U/L   Total Bilirubin 1.0 0.3 - 1.2 mg/dL   GFR, Estimated 29 (L) >60 mL/min    Comment: (NOTE) Calculated using the CKD-EPI Creatinine Equation (2021)    Anion gap 7 5 - 15    Comment: Performed at [facility], [place] [address], [place], [place] [address]  CBC with Differential     Status: Abnormal    Collection Time: 10/17/21  7:43 AM  Result Value Ref Range   WBC 11.2 (H) 4.0 - 10.5 K/uL   RBC 3.59 (L) 3.87 - 5.11 MIL/uL   Hemoglobin 11.0 (L) 12.0 - 15.0 g/dL   HCT 34.5 (L) 36.0 - 46.0 %   MCV 96.1 80.0 - 100.0 fL   MCH 30.6 26.0 - 34.0 pg   MCHC 31.9 30.0 - 36.0 g/dL   RDW 13.2 11.5 - 15.5 %   Platelets 350 150 - 400 K/uL   nRBC 0.0 0.0 - 0.2 %   Neutrophils Relative % 74 %   Neutro Abs 8.2 (H) 1.7 - 7.7 K/uL   Lymphocytes Relative 16 %   Lymphs Abs 1.7 0.7 - 4.0 K/uL   Monocytes Relative 7 %   Monocytes Absolute 0.8 0.1 - 1.0 K/uL   Eosinophils Relative 3 %   Eosinophils Absolute 0.4 0.0 - 0.5 K/uL   Basophils Relative 0 %   Basophils Absolute 0.0 0.0 - 0.1 K/uL   Immature Granulocytes 0 %   Abs Immature Granulocytes 0.04 0.00 - 0.07 K/uL    Comment: Performed at [facility], [place] [address] [place], [place] [address]  Urinalysis, Routine w reflex microscopic Urine, In & Out Cath     Status: Abnormal   Collection Time: 10/17/21 10:20 AM  Result Value Ref Range   Color, Urine YELLOW YELLOW   APPearance CLEAR CLEAR   Specific Gravity, Urine 1.016 1.005 - 1.030   pH 5.0 5.0 - 8.0   Glucose, UA NEGATIVE NEGATIVE mg/dL   Hgb urine dipstick NEGATIVE NEGATIVE   Bilirubin Urine NEGATIVE NEGATIVE   Ketones, ur NEGATIVE NEGATIVE mg/dL   Protein, ur 30 (A) NEGATIVE mg/dL   Nitrite NEGATIVE NEGATIVE   Leukocytes,Ua NEGATIVE NEGATIVE   RBC / HPF 0-5 0 - 5 RBC/hpf   WBC, UA 0-5 0 - 5 WBC/hpf   Bacteria, UA NONE SEEN NONE SEEN   Squamous Epithelial / LPF 0-5 0 - 5   Mucus PRESENT     Comment: Performed at [facility], [place] [address] [place], [place] [address]   No results found.  Anti-infectives (From admission, onward)    None  Assessment/Plan Ileostomy Pouching Issues with Skin irritation/breakdown Hx of Crohn's s/p ileocecectomy and sigmoidectomy in 02/2021 by Dr. Hilliard Clark at [facility] - I have reached out to [facility] for  assistance with pouching and skin barriers to assist with skin irritation/breakdown from leaking of current ileostomy pouching system.  - Patient appears to continue to have high output from her ileostomy but has not been measuring output at home. She reports her ostomy bag will fill to at least 3/4 full at least 5x/day and need to be emptied (plus output that leaks around ileostomy pouching appliance). Suspect improving output will help prevent leaking and also prevent the patient from getting dehydrated. The patient is currently taking imodium 2mg  TID and fibercon BID at home. Recommend increasing imodium to 4mg  TID and continuing Fibercon BID. I also recommend that she avoid sugary drinks, such as the sweet tea she has been drinking at home, as this can contribute to high output. If output remains > 1L, consider adding back in iron. Also may benefit from emptying her ostomy bag before it get's completely full as this may be one contributing factor for leakage around the pouching system. - I recommended the EDP reach out to the patient's surgeon, Dr. Hilliard Clark, of [facility] to let her surgeon know that the patient has been in and out of the ED for these issues. The patient would benefit greatly to be under the care of her surgeon, whether this is inpatient vs output - Patient is not currently followed by any GI practice for hx of Crohn's. She previously was followed by [facility] but due to multiple no shows she was discharged from the practice. Recommend that she follow up with [facility] or GI practice at [facility] if her surgeon prefer's this. - Will refer to ostomy clinic again - Will benefit for case manager to see her to see if they can help her with assistance to and from her appointments etc - Will defer to [facility] on if patient requires admission for elevated creatine etc. If patient requires admission we would recommend medical admission and we can follow to assist with titration of medications to assist with high  ileostomy output.  - I have discussed above with the [place], [facility] 10/17/2021, 11:08 AM Please see Amion for pager number during day hours 7:00am-4:30pm

## 2021-10-17 NOTE — H&P (Signed)
History and Physical    Cynthia [name] KGM:[id number] DOB: [birth year] DOA: 10/17/2021  PCP: Denita Lung, MD   Patient coming from: Home.  I have personally briefly reviewed patient's old medical records in [facility]  Chief Complaint: Colostomy malfunction.  HPI: Cynthia [name] is a 60 y.o. female with medical history significant of anemia, anxiety, bronchitis, Crohn's ileitis, depression, GERD, migraine headaches, heart murmur, hypertension, IBS, incontinence, history of MVP, RLS, jejunal intussusception, high output ileostomy department due to her ileostomy bag falling off.  Burning and inflammation around the ileostomy site.  She denied fever, chills, night sweats, decreased appetite, nausea, vomiting, melena or hematochezia.  No flank pain, dysuria, frequency hematuria.  No rhinorrhea, sore throat, dyspnea, wheezing or hemoptysis.  No chest pain, palpitations, diaphoresis, PND, orthopnea.  No lower extremities.  No polyuria, polydipsia, polyphagia or blurred vision.  ED Course: Initial vital signs were temperature 97.9 F, pulse 99, respiration 20, BP 146/79 mmHg O2 sat 95% on room air.  Patient was given 2 L of LR in the emergency department.  She was seen by surgery in consult.  Lab work: Urinalysis showed proteinuria 30 mg/dL but was otherwise normal.  CBC showed a white count 11.2, hemoglobin 11.0 g/dL and platelets 350.  BMP showed a sodium 133, potassium 5.3, chloride 111 and CO2 15 mmol/L.  BUN was 59 and creatinine 1.96 mg/dL.  LFTs were normal.  Review of Systems: As per HPI otherwise all other systems reviewed and are negative.  Past Medical History:  Diagnosis Date   Anemia 2012   Anxiety    Bronchitis    Crohn's ileitis ([place])    Depression    GERD (gastroesophageal reflux disease)    Headache(784.0)    MIGRAINE   Heart murmur    Hiatal hernia    Hypertension    IBS (irritable bowel syndrome)    Incontinence    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HCC)    MVP  (mitral valve prolapse)    RLS (restless legs syndrome)    Tubular adenoma of colon 08/2019    Past Surgical History:  Procedure Laterality Date   ABDOMINAL HYSTERECTOMY  06/17/2011   Procedure: HYSTERECTOMY ABDOMINAL;  Surgeon: Arloa Koh;  Location: [facility];  Service: Gynecology;  Laterality: N/A;  Converted to Abdominal Hysterectomy with lysis of adhesions    ANTERIOR AND POSTERIOR REPAIR  02/25/2012   Procedure: ANTERIOR (CYSTOCELE) AND POSTERIOR REPAIR (RECTOCELE);  Surgeon: Daria Pastures, MD;  Location: [facility];  Service: Gynecology;  Laterality: N/A;  ANterior cystocele repair ONLY   BIOPSY  08/29/2019   Procedure: BIOPSY;  Surgeon: Lavena Bullion, DO;  Location: [facility];  Service: Gastroenterology;;   BLADDER SUSPENSION  02/25/2012   Procedure: TRANSVAGINAL TAPE (TVT) PROCEDURE;  Surgeon: Daria Pastures, MD;  Location: [facility];  Service: Gynecology;  Laterality: N/A;   COLONOSCOPY WITH PROPOFOL N/A 08/29/2019   Procedure: COLONOSCOPY WITH PROPOFOL;  Surgeon: Lavena Bullion, DO;  Location: [facility];  Service: Gastroenterology;  Laterality: N/A;   CYSTOSCOPY  02/25/2012   Procedure: CYSTOSCOPY;  Surgeon: Daria Pastures, MD;  Location: [facility];  Service: Gynecology;  Laterality: N/A;   ILEOSTOMY     KNEE ARTHROSCOPY  2010   LAPAROSCOPIC ASSISTED VAGINAL HYSTERECTOMY  06/17/2011   Procedure: LAPAROSCOPIC ASSISTED VAGINAL HYSTERECTOMY;  Surgeon: Arloa Koh;  Location: [facility];  Service: Gynecology;  Laterality: N/A;  Attempted    POLYPECTOMY  08/29/2019   Procedure: POLYPECTOMY;  Surgeon: Gerrit Heck  V, DO;  Location: [facility];  Service: Gastroenterology;;   SALPINGOOPHORECTOMY  06/17/2011   Procedure: SALPINGO OOPHERECTOMY;  Surgeon: Arloa Koh;  Location: [facility];  Service: Gynecology;  Laterality: Bilateral;   TONSILLECTOMY     Social History  reports that she has been smoking cigarettes. She has never used smokeless tobacco. She reports that  she does not currently use alcohol. She reports that she does not currently use drugs after having used the following drugs: Cocaine.  Allergies  Allergen Reactions   Iodine Other (See Comments)    The patient said she had a reaction from iodine in some eye drops- caused eyes to turn red;  pt has no reactions to CT contrast   Family History  Problem Relation Age of Onset   Diabetes Mother    Dementia Mother    Clotting disorder Father    Macular degeneration Father    Crohn's disease Sister    Colon cancer Neg Hx    Esophageal cancer Neg Hx    Pancreatic cancer Neg Hx    Stomach cancer Neg Hx    Liver disease Neg Hx    Prior to Admission medications   Medication Sig Start Date End Date Taking? Authorizing Provider  acetaminophen (TYLENOL) 500 MG tablet Take 1,000 mg by mouth every 6 (six) hours as needed for headache or mild pain.   Yes [provider]  loperamide (IMODIUM) 2 MG capsule Take 1 capsule (2 mg total) by mouth 3 (three) times daily. 09/29/21  Yes Maczis, Barth Kirks, PA-C  losartan-hydrochlorothiazide (HYZAAR) 50-12.5 MG tablet Take 1 tablet by mouth daily.   Yes [provider]  polycarbophil (FIBERCON) 625 MG tablet Take 1 tablet (625 mg total) by mouth 2 (two) times daily. 09/29/21  Yes Maczis, Barth Kirks, PA-C  venlafaxine XR (EFFEXOR-XR) 150 MG 24 hr capsule Take 150 mg by mouth daily with breakfast.   Yes [provider]  cephALEXin (KEFLEX) 500 MG capsule Take 1 capsule (500 mg total) by mouth 4 (four) times daily. Patient not taking: Reported on 10/17/2021 10/03/21   Kommor, Debe Coder, MD  cholestyramine (QUESTRAN) 4 g packet Take 1 packet (4 g total) by mouth 3 (three) times daily. Patient not taking: Reported on 10/17/2021 08/20/21 09/26/21  Barb Merino, MD  ferrous sulfate 325 (65 FE) MG tablet Take 1 tablet (325 mg total) by mouth 2 (two) times daily with a meal. Patient not taking: Reported on 10/17/2021 09/29/21   Maczis, Barth Kirks,  PA-C  oxyCODONE (OXY IR/ROXICODONE) 5 MG immediate release tablet Take 1 tablet (5 mg total) by mouth every 6 (six) hours as needed for breakthrough pain. Patient not taking: Reported on 10/17/2021 09/29/21   Jillyn Ledger, PA-C  Zinc Oxide (TRIPLE PASTE) 12.8 % ointment Apply topically as needed for irritation. Patient not taking: Reported on 10/17/2021 09/29/21   Jillyn Ledger, [place]   Physical Exam: Vitals:   10/17/21 1000 10/17/21 1115 10/17/21 1200 10/17/21 1324  BP: 101/61 112/77 (!) 87/64 113/73  Pulse: 63 79 73 75  Resp: 16 16 16 16   Temp:      TempSrc:      SpO2: 100% 100% 99% 100%  Weight:      Height:       Constitutional: NAD, calm, comfortable Eyes: PERRL, lids and conjunctivae normal ENMT: Mucous membranes are mildly dry.  Posterior pharynx clear of any exudate or lesions. Neck: normal, supple, no masses, no thyromegaly Respiratory: clear to auscultation bilaterally, no wheezing,  no crackles. Normal respiratory effort. No accessory muscle use.  Cardiovascular: Regular rate and rhythm, no murmurs / rubs / gallops. No extremity edema. 2+ pedal pulses. No carotid bruits.  Abdomen: No distention.  Ileostomy in place.  Soft, no tenderness, no masses palpated. No hepatosplenomegaly. Bowel sounds positive.  Musculoskeletal: no clubbing / cyanosis. Good ROM, no contractures. Normal muscle tone.  Skin: Positive erythema around ileostomy site. Neurologic: CN 2-12 grossly intact. Sensation intact, DTR normal. Strength 5/5 in all 4.  Psychiatric: Normal judgment and insight. Alert and oriented x 3. Normal mood.   Labs on Admission: I have personally reviewed following labs and imaging studies  CBC: Recent Labs  Lab 10/17/21 0743  WBC 11.2*  NEUTROABS 8.2*  HGB 11.0*  HCT 34.5*  MCV 96.1  PLT 242    Basic Metabolic Panel: Recent Labs  Lab 10/17/21 0743  NA 133*  K 5.3*  CL 111  CO2 15*  GLUCOSE 97  BUN 59*  CREATININE 1.96*  CALCIUM 9.1     GFR: Estimated Creatinine Clearance: 25.9 mL/min (A) (by C-G formula based on SCr of 1.96 mg/dL (H)).  Liver Function Tests: Recent Labs  Lab 10/17/21 0743  AST 30  ALT 17  ALKPHOS 62  BILITOT 1.0  PROT 7.7  ALBUMIN 4.1    Urine analysis:    Component Value Date/Time   COLORURINE YELLOW 10/17/2021 1020   APPEARANCEUR CLEAR 10/17/2021 1020   LABSPEC 1.016 10/17/2021 1020        PHURINE 5.0 10/17/2021 1020   GLUCOSEU NEGATIVE 10/17/2021 1020   HGBUR NEGATIVE 10/17/2021 1020   BILIRUBINUR NEGATIVE 10/17/2021 [place] 10/17/2021 1020   PROTEINUR 30 (A) 10/17/2021 1020        NITRITE NEGATIVE 10/17/2021 [place] 10/17/2021 1020    Radiological Exams on Admission: No results found.  EKG: Independently reviewed.   Assessment/Plan Principal Problem:   AKI (acute kidney injury) ([place]) Due to   High output ileostomy ([place]) Observation/telemetry. Continue IV fluids. Avoid hypotension. Nephrotoxins. Monitor intake and output. Follow-up renal function electrolytes.  Active Problems:   Essential hypertension Hold losartan. Monitor BP, renal function electrolytes.    Anxiety associated with depression Continue Effexor.    Hyperkalemia Continue IV fluids. Follow-up potassium level.    Hyponatremia Continue IV fluids.   Follow-up sodium level in AM.      DVT prophylaxis: Lovenox SQ. Code Status:   Full code. Family Communication:   Disposition Plan:   Patient is from:  Home.  Anticipated DC to:  Home.  Anticipated DC date:  10/18/2021.  Anticipated DC barriers: Clinical status.  Consults called:   Admission status:  Observation/telemetry.    Severity of Illness:High severity after presenting with AKI in the setting of uncompensated high output ileostomy.  Patient will remain in the hospital for IV hydration.  Reubin Milan MD [facility]  How to contact the [facility] Attending or Consulting  provider [place] or covering provider during after hours [place], for this patient?   Check the care team in [facility] and look for a) attending/consulting [facility] provider listed and b) the [facility] team listed Log into [email or web address] and use [place]'s universal password to access. If you do not have the password, please contact the hospital operator. Locate the [facility] provider you are looking for under [facility] and page to a number that you can be directly reached. If  you still have difficulty reaching the provider, please page the [facility] (Director on Call) for the Hospitalists listed on amion for assistance.  10/17/2021, 2:49 PM   This document was prepared using Dragon voice recognition software and may contain some unintended transcription errors.

## 2021-10-18 DIAGNOSIS — K9413 Enterostomy malfunction: Secondary | ICD-10-CM | POA: Diagnosis present

## 2021-10-18 DIAGNOSIS — R198 Other specified symptoms and signs involving the digestive system and abdomen: Secondary | ICD-10-CM | POA: Diagnosis not present

## 2021-10-18 DIAGNOSIS — F418 Other specified anxiety disorders: Secondary | ICD-10-CM | POA: Diagnosis present

## 2021-10-18 DIAGNOSIS — E872 Acidosis, unspecified: Secondary | ICD-10-CM | POA: Diagnosis present

## 2021-10-18 DIAGNOSIS — D631 Anemia in chronic kidney disease: Secondary | ICD-10-CM | POA: Diagnosis present

## 2021-10-18 DIAGNOSIS — Z79899 Other long term (current) drug therapy: Secondary | ICD-10-CM | POA: Diagnosis not present

## 2021-10-18 DIAGNOSIS — Z20822 Contact with and (suspected) exposure to covid-19: Secondary | ICD-10-CM | POA: Diagnosis present

## 2021-10-18 DIAGNOSIS — I129 Hypertensive chronic kidney disease with stage 1 through stage 4 chronic kidney disease, or unspecified chronic kidney disease: Secondary | ICD-10-CM | POA: Diagnosis present

## 2021-10-18 DIAGNOSIS — K50019 Crohn's disease of small intestine with unspecified complications: Secondary | ICD-10-CM | POA: Diagnosis not present

## 2021-10-18 DIAGNOSIS — E871 Hypo-osmolality and hyponatremia: Secondary | ICD-10-CM

## 2021-10-18 DIAGNOSIS — L24B3 Irritant contact dermatitis related to fecal or urinary stoma or fistula: Secondary | ICD-10-CM | POA: Diagnosis present

## 2021-10-18 DIAGNOSIS — F17210 Nicotine dependence, cigarettes, uncomplicated: Secondary | ICD-10-CM | POA: Diagnosis present

## 2021-10-18 DIAGNOSIS — Z6820 Body mass index (BMI) 20.0-20.9, adult: Secondary | ICD-10-CM | POA: Diagnosis not present

## 2021-10-18 DIAGNOSIS — N179 Acute kidney failure, unspecified: Secondary | ICD-10-CM | POA: Diagnosis present

## 2021-10-18 DIAGNOSIS — I10 Essential (primary) hypertension: Secondary | ICD-10-CM

## 2021-10-18 DIAGNOSIS — K30 Functional dyspepsia: Secondary | ICD-10-CM | POA: Diagnosis present

## 2021-10-18 DIAGNOSIS — Z932 Ileostomy status: Secondary | ICD-10-CM

## 2021-10-18 DIAGNOSIS — Z888 Allergy status to other drugs, medicaments and biological substances status: Secondary | ICD-10-CM | POA: Diagnosis not present

## 2021-10-18 DIAGNOSIS — Y833 Surgical operation with formation of external stoma as the cause of abnormal reaction of the patient, or of later complication, without mention of misadventure at the time of the procedure: Secondary | ICD-10-CM | POA: Diagnosis present

## 2021-10-18 DIAGNOSIS — E860 Dehydration: Secondary | ICD-10-CM | POA: Diagnosis present

## 2021-10-18 DIAGNOSIS — K5000 Crohn's disease of small intestine without complications: Secondary | ICD-10-CM | POA: Diagnosis present

## 2021-10-18 DIAGNOSIS — I341 Nonrheumatic mitral (valve) prolapse: Secondary | ICD-10-CM | POA: Diagnosis present

## 2021-10-18 DIAGNOSIS — E539 Vitamin B deficiency, unspecified: Secondary | ICD-10-CM | POA: Diagnosis present

## 2021-10-18 DIAGNOSIS — N1831 Chronic kidney disease, stage 3a: Secondary | ICD-10-CM | POA: Diagnosis present

## 2021-10-18 DIAGNOSIS — E875 Hyperkalemia: Secondary | ICD-10-CM | POA: Diagnosis present

## 2021-10-18 DIAGNOSIS — E43 Unspecified severe protein-calorie malnutrition: Secondary | ICD-10-CM | POA: Diagnosis present

## 2021-10-18 DIAGNOSIS — K219 Gastro-esophageal reflux disease without esophagitis: Secondary | ICD-10-CM | POA: Diagnosis present

## 2021-10-18 LAB — CBC
HCT: 31.2 % — ABNORMAL LOW (ref 36.0–46.0)
Hemoglobin: 9.9 g/dL — ABNORMAL LOW (ref 12.0–15.0)
MCH: 30.5 pg (ref 26.0–34.0)
MCHC: 31.7 g/dL (ref 30.0–36.0)
MCV: 96 fL (ref 80.0–100.0)
Platelets: 309 10*3/uL (ref 150–400)
RBC: 3.25 MIL/uL — ABNORMAL LOW (ref 3.87–5.11)
RDW: 13.2 % (ref 11.5–15.5)
WBC: 7.1 10*3/uL (ref 4.0–10.5)
nRBC: 0 % (ref 0.0–0.2)

## 2021-10-18 LAB — BASIC METABOLIC PANEL
Anion gap: 4 — ABNORMAL LOW (ref 5–15)
BUN: 39 mg/dL — ABNORMAL HIGH (ref 8–23)
CO2: 21 mmol/L — ABNORMAL LOW (ref 22–32)
Calcium: 8.5 mg/dL — ABNORMAL LOW (ref 8.9–10.3)
Chloride: 113 mmol/L — ABNORMAL HIGH (ref 98–111)
Creatinine, Ser: 1.63 mg/dL — ABNORMAL HIGH (ref 0.44–1.00)
GFR, Estimated: 36 mL/min — ABNORMAL LOW (ref >60)
Glucose, Bld: 78 mg/dL (ref 70–99)
Potassium: 3.8 mmol/L (ref 3.5–5.1)
Sodium: 138 mmol/L (ref 135–145)

## 2021-10-18 LAB — GLUCOSE, CAPILLARY: Glucose-Capillary: 132 mg/dL — ABNORMAL HIGH (ref 70–99)

## 2021-10-18 MED ORDER — FERROUS SULFATE 325 (65 FE) MG PO TABS
325.0000 mg | ORAL_TABLET | Freq: Two times a day (BID) | ORAL | Status: DC
  Administered 2021-10-18 – 2021-10-27 (×19): 325 mg via ORAL
  Filled 2021-10-18 (×19): qty 1

## 2021-10-18 MED ORDER — POTASSIUM CHLORIDE CRYS ER 20 MEQ PO TBCR
40.0000 meq | EXTENDED_RELEASE_TABLET | Freq: Once | ORAL | Status: AC
  Administered 2021-10-18: 40 meq via ORAL
  Filled 2021-10-18: qty 2

## 2021-10-18 MED ORDER — DIPHENHYDRAMINE HCL 25 MG PO CAPS
25.0000 mg | ORAL_CAPSULE | Freq: Every evening | ORAL | Status: AC | PRN
  Administered 2021-10-18 – 2021-10-25 (×2): 25 mg via ORAL
  Filled 2021-10-18 (×2): qty 1

## 2021-10-18 MED ORDER — LACTATED RINGERS IV BOLUS
500.0000 mL | Freq: Once | INTRAVENOUS | Status: AC
Start: 2021-10-18 — End: 2021-10-18
  Administered 2021-10-18: 500 mL via INTRAVENOUS

## 2021-10-18 MED ORDER — ACETAMINOPHEN 325 MG PO TABS
650.0000 mg | ORAL_TABLET | Freq: Four times a day (QID) | ORAL | Status: DC | PRN
  Administered 2021-10-18 – 2021-10-26 (×7): 650 mg via ORAL
  Filled 2021-10-18 (×7): qty 2

## 2021-10-18 MED ORDER — LACTATED RINGERS IV BOLUS
500.0000 mL | Freq: Once | INTRAVENOUS | Status: AC
  Administered 2021-10-18: 500 mL via INTRAVENOUS

## 2021-10-18 MED ORDER — ACETAMINOPHEN 650 MG RE SUPP: 650.0000 mg | Freq: Four times a day (QID) | RECTAL | Status: DC | PRN

## 2021-10-18 MED ORDER — LOPERAMIDE HCL 2 MG PO CAPS
4.0000 mg | ORAL_CAPSULE | Freq: Four times a day (QID) | ORAL | Status: DC
  Administered 2021-10-18 – 2021-10-27 (×37): 4 mg via ORAL
  Filled 2021-10-18 (×37): qty 2

## 2021-10-18 NOTE — Progress Notes (Addendum)
Subjective: CC: Patient reports no leakage from ostomy bag overnight/this morning. Her bag is almost completely full however and I emptied it while I was in the room with evacuation of 650cc stool. She is not wearing her ostomy belt and does not know where this is. Her pain has greatly improved. She is tolerating her diet without n/v.   Ileostomy output 2.35L since admission  Objective: Vital signs in last 24 hours: Temp:  [97.7 F (36.5 C)-99.1 F (37.3 C)] 99.1 F (37.3 C) (12/16 0742) Pulse Rate:  [63-92] 73 (12/16 0742) Resp:  [16-18] 18 (12/16 0742) BP: (78-118)/(56-80) 89/62 (12/16 0742) SpO2:  [90 %-100 %] 100 % (12/16 0742) Last BM Date: 10/17/21  Intake/Output from previous day: 12/15 0701 - 12/16 0700 In: 4568.2 [P.O.:1600; I.V.:968.2; IV Piggyback:2000] Out: 2350 [Stool:2350] Intake/Output this shift: No intake/output data recorded.  PE: Gen:  Alert, NAD, pleasant Pulm: Rate and effort normal Abd: Soft, ND, +BS, ileostomy pink and protruding. There is liquid stool in ostomy bag. Skin erythema/ skin excoriation along the right side of her abdomen extending to the right flank is already improving.  Psych: A&Ox3  Skin: no rashes noted, warm and dry  Picture from yesterday 12/15    Lab Results:  Recent Labs    10/17/21 0743 10/18/21 0554  WBC 11.2* 7.1  HGB 11.0* 9.9*  HCT 34.5* 31.2*  PLT 350 309   BMET Recent Labs    10/17/21 0743 10/18/21 0554  NA 133* 138  K 5.3* 3.8  CL 111 113*  CO2 15* 21*  GLUCOSE 97 78  BUN 59* 39*  CREATININE 1.96* 1.63*  CALCIUM 9.1 8.5*   PT/INR No results for input(s): LABPROT, INR in the last 72 hours. CMP     Component Value Date/Time   NA 138 10/18/2021 0554   NA 136 07/19/2021 1324   K 3.8 10/18/2021 0554   CL 113 (H) 10/18/2021 0554   CO2 21 (L) 10/18/2021 0554   GLUCOSE 78 10/18/2021 0554   BUN 39 (H) 10/18/2021 0554   BUN 25 07/19/2021 1324   CREATININE 1.63 (H) 10/18/2021 0554   CREATININE  0.94 05/08/2016 0741   CALCIUM 8.5 (L) 10/18/2021 0554   PROT 7.7 10/17/2021 0743   PROT 5.5 (L) 07/19/2021 1324   ALBUMIN 4.1 10/17/2021 0743   ALBUMIN 3.4 (L) 07/19/2021 1324   AST 30 10/17/2021 0743   ALT 17 10/17/2021 0743   ALKPHOS 62 10/17/2021 0743   BILITOT 1.0 10/17/2021 0743   BILITOT <0.2 07/19/2021 1324   GFRNONAA 36 (L) 10/18/2021 0554   GFRAA 77 12/18/2020 1548   Lipase     Component Value Date/Time   LIPASE 73 (H) 09/14/2021 0525    Studies/Results: No results found.  Anti-infectives: Anti-infectives (From admission, onward)    None        Assessment/Plan High output Ileostomy  Pouching Issues with Skin irritation/breakdown Hx of Crohn's s/p ileocecectomy and sigmoidectomy in 02/2021 by Dr. Hilliard Clark at [facility] following for assistance with pouching and skin barriers to assist with skin irritation/breakdown from leaking of ileostomy pouching system. No leakage from ileostomy today. I have asked her RN to obtained ostomy belt that was ordered by [facility] and apply. Stressed importance of emptying ostomy bag to patient when it is 1/3 to 1/2 full and not waiting till it is completely full.  - Titrate medications for high output ileostomy. Increase imodium to 4mg  QID and add iron BID. Cont fibercon  BID. Goal output <1L. Consider adding Lomotil if continues to have high output. Was previously on Questran during prior admission but has not been taking this at home. I also recommend that she avoid sugary drinks, such as the sweet tea she has been drinking at home, as this can contribute to high output. - EDP reached out to the patient's surgeon, Dr. Hilliard Clark, of [facility] and spoke with his team. Per my conversation with EDP, Dr. Ardath Sax team will reach out to the patient to arrange outpatient follow up - Patient is not currently followed by any GI practice for hx of Crohn's. She previously was followed by [facility] but due to multiple no shows she was discharged from the  practice. Recommend that she follow up with [facility] or GI practice at [facility] if her surgeon prefer's this. - Will refer to ostomy clinic again - Will benefit for case manager to see her to see if they can help her with transportation assistance to and from her appointments etc. Consult placed   LOS: 0 days    Jillyn Ledger , [facility] 10/18/2021, 9:20 AM Please see Amion for pager number during day hours 7:00am-4:30pm

## 2021-10-18 NOTE — Progress Notes (Signed)
PROGRESS NOTE    Cynthia [name]  TJQ:[id number] DOB: [birth year] DOA: 10/17/2021 PCP: Denita Lung, MD   Brief Narrative:  The patient is a 60 year old Caucasian female with a past medical history significant for but not limited to anemia, anxiety and depression, bronchitis, history of Crohn's ileitis who underwent ileocecectomy and sigmoidectomy in April of this year with an ileostomy pouch, GERD, history of migraine headaches, history of heart murmur, hypertension, IBS, history of incontinence, history of MVP, RLS, history of jejunal intussusception, history of high output ileostomy who presented to the ED with issues with pouching, skin breakdown and hyper ileostomy.  She has been to the ED multiple visits for this and was admitted back in November for an AKI with high output and she was placed on Imodium and iron with improvement in her output.  She really presents today again with her ileostomy bag falling off and complaining of burning and inflammation around her ileostomy site.  She denies any fevers, chills, night sweats or decreased appetite and no flank pain.  Of note patient has not been monitoring her output at home and reports that her ostomy bag was filled with 3 forceful and at least 5 times a day will need to be emptied.  She continued to have leakage around her ostomy bag with increasing redness and excoriation.  In the ED she is given 2 L of lactated Ringer's and surgery was consulted.  She did have an AKI with a BUNs/creatinine 59/1.96 but LFTs were normal.  She also had a mildly elevated potassium and a metabolic acidosis.  She was admitted for AKI in the setting of high output ileostomy with inability to keep up with her output.  General surgery has been consulted and has made some changes.  Assessment & Plan:   Principal Problem:   AKI (acute kidney injury) ([place]) Active Problems:   Essential hypertension   Anxiety associated with depression   Hyperkalemia    Hyponatremia   High output ileostomy (HCC)   Crohn's ileitis (HCC)   AKI on suspected CKD stage IIIa in the setting of high output ileostomy Metabolic acidosis -Patient presents with a BUNs/creatinine of 59/1.96 and her BUNs/creatinine has now improved with IV fluid hydration -Recently creatinine has been around 1.2-1.4 but has been normal less than 1 earlier this year prior to her surgical intervention -She was given 2-1/2 L of lactated Ringer's boluses in the ED yesterday and then placed on maintenance IV fluid at 125 mils per hour; will give another 500 mL bolus today -Patient's metabolic acidosis is improving -We will avoid further nephrotoxic medications, contrast dyes, hypotension renally dose medications -She does have a small metabolic acidosis with a CO2 of 21, anion gap of 4, chloride level 113 now -We will continue monitor her output and continue with IV fluid hydration -Surgery is being consulted for her high output ileostomy  High output ileostomy -Still having quite a bit of drainage and glucose to have less than 1 L a day -General surgery consulted and recommending ferrous sulfate 325 mg p.o. twice daily with meals, loperamide 4 mg p.o. 4 times daily, as well as polycarbophil 6.5 mg p.o. twice daily -Continue with IV fluid hydration with lactated Ringer's at 125 MLS per hour -[facility] has been consulted for colostomy care and they are recommending lidocaine 4% cream topically 4 times daily as needed for pain control edema at ileostomy site and they are recommending applying to around the ileostomy site as needed as well as Benadryl  cream 3 times daily as needed itching the painful erythema on ileostomy site -General surgery is titrating medications for her high output ileostomy given the goal is an output of less than 1 L a day -If she continues to have significant output they are considering Lomotil -WBC went from 11.2 and is now 7.1 -They have reached out to her primary team at  [facility] to arrange outpatient follow-up -She will be referred to the ostomy clinic and case manager has been consulted for help with transportation for appointments  Essential Hypertension -Given her AKI we will hold her losartan-HCTZ 50-12.5 mg tablets -We will continue monitor blood pressures per protocol blood pressures have been on the softer side -Last blood pressure reading was 89/62 so we will give her another bolus of 500 mL of lactated Ringer's  Hyperkalemia -In the setting of her renal dysfunction from her high output ileostomy -Continue IV fluid hydration as above -K+ is improving and went from 5.3 is now 3.8 -Continue to monitor  Normocytic anemia/anemia of chronic kidney disease -Patient's hemoglobin/hematocrit went from 11.0/34.5 and now trended down to 9.9/31.2 -Check anemia panel in the a.m. -Continue to monitor for signs and symptoms of bleeding; currently no overt bleeding noted -Repeat CBC in the a.m.  Depression and anxiety -Continue with venlafaxine XR 150 mg p.o. daily  DVT prophylaxis: Enoxaparin 40 mg sq q24 Code Status: FULL CODE Family Communication: No family present at bedside  Disposition Plan: Pending further clinical improvement and improvement in her high output ileostomy; she needs to have less than a liter day prior to being safely discharged  Status is: Observation  The patient will require care spanning > 2 midnights and should be moved to inpatient because: She continues have significant amount of output from her ostomy  Consultants:  General Surgery   Procedures: None  Antimicrobials:  Anti-infectives (From admission, onward)    None        Subjective: Seen and examined at bedside and still has quite a bit of output from her ostomy already.  Has been changed multiple times by nursing this AM.  No chest pain or shortness breath.  Feels okay but feels fatigued and little tired.  The pain in her ostomy site is improving.  She is  tolerating her diet without nausea or vomiting.  Objective: Vitals:   10/17/21 1933 10/17/21 2331 10/18/21 0326 10/18/21 0742  BP: 110/73 (!) 85/56 (!) 92/57 (!) 89/62  Pulse: 78 87 81 73  Resp: 18 18 18 18   Temp: 97.7 F (36.5 C) 98.2 F (36.8 C) 98 F (36.7 C) 99.1 F (37.3 C)  TempSrc: Oral   Oral  SpO2: 93% 98% 100% 100%  Weight:      Height:        Intake/Output Summary (Last 24 hours) at 10/18/2021 1326 Last data filed at 10/18/2021 1259 Gross per 24 hour  Intake 3928.23 ml  Output 3600 ml  Net 328.23 ml   Filed Weights   10/17/21 0455  Weight: 54.4 kg   Examination: Physical Exam:  Constitutional: Thin Caucasian female in no acute distress resting in bed Eyes: Lids and conjunctivae normal, sclerae anicteric  ENMT: External Ears, Nose appear normal. Grossly normal hearing. Mucous membranes are moist.  Neck: Appears normal, supple, no cervical masses, normal ROM, no appreciable thyromegaly; no appreciable JVD Respiratory: Diminished to auscultation bilaterally, no wheezing, rales, rhonchi or crackles. Normal respiratory effort and patient is not tachypenic. No accessory muscle use.  Unlabored breathing Cardiovascular: RRR, no  murmurs / rubs / gallops. S1 and S2 auscultated.  Abdomen: Soft, mildly-tender, non-distended.  Ostomy in place and she has quite a bit of stool in her bag already bowel sounds positive.  GU: Deferred. Musculoskeletal: No clubbing / cyanosis of digits/nails. No joint deformity upper and lower extremities.  Skin: No rashes, lesions, ulcers on limited skin evaluation. No induration; Warm and dry.  Neurologic: CN 2-12 grossly intact with no focal deficits.  Romberg sign and cerebellar reflexes not assessed.  Psychiatric: Normal judgment and insight. Alert and oriented x 3. Normal mood and appropriate affect.   Data Reviewed: I have personally reviewed following labs and imaging studies  CBC: Recent Labs  Lab 10/17/21 0743 10/18/21 0554  WBC  11.2* 7.1  NEUTROABS 8.2*  --   HGB 11.0* 9.9*  HCT 34.5* 31.2*  MCV 96.1 96.0  PLT 350 774   Basic Metabolic Panel: Recent Labs  Lab 10/17/21 0743 10/18/21 0554  NA 133* 138  K 5.3* 3.8  CL 111 113*  CO2 15* 21*  GLUCOSE 97 78  BUN 59* 39*  CREATININE 1.96* 1.63*  CALCIUM 9.1 8.5*   GFR: Estimated Creatinine Clearance: 31.1 mL/min (A) (by C-G formula based on SCr of 1.63 mg/dL (H)). Liver Function Tests: Recent Labs  Lab 10/17/21 0743  AST 30  ALT 17  ALKPHOS 62  BILITOT 1.0  PROT 7.7  ALBUMIN 4.1   No results for input(s): LIPASE, AMYLASE in the last 168 hours. No results for input(s): AMMONIA in the last 168 hours. Coagulation Profile: No results for input(s): INR, PROTIME in the last 168 hours. Cardiac Enzymes: No results for input(s): CKTOTAL, CKMB, CKMBINDEX, TROPONINI in the last 168 hours. BNP (last 3 results) No results for input(s): PROBNP in the last 8760 hours. HbA1C: No results for input(s): HGBA1C in the last 72 hours. CBG: Recent Labs  Lab 10/18/21 0010  GLUCAP 132*   Lipid Profile: No results for input(s): CHOL, HDL, LDLCALC, TRIG, CHOLHDL, LDLDIRECT in the last 72 hours. Thyroid Function Tests: No results for input(s): TSH, T4TOTAL, FREET4, T3FREE, THYROIDAB in the last 72 hours. Anemia Panel: No results for input(s): VITAMINB12, FOLATE, FERRITIN, TIBC, IRON, RETICCTPCT in the last 72 hours. Sepsis Labs: No results for input(s): PROCALCITON, LATICACIDVEN in the last 168 hours.  Recent Results (from the past 240 hour(s))  Resp Panel by RT-PCR (Flu A&B, Covid) Nasopharyngeal Swab     Status: None   Collection Time: 10/17/21  1:53 PM   Specimen: Nasopharyngeal Swab; Nasopharyngeal(NP) swabs in vial transport medium  Result Value Ref Range Status   SARS Coronavirus 2 by RT PCR NEGATIVE NEGATIVE Final    Comment: (NOTE) SARS-CoV-2 target nucleic acids are NOT DETECTED.  The SARS-CoV-2 RNA is generally detectable in upper  respiratory specimens during the acute phase of infection. The lowest concentration of SARS-CoV-2 viral copies this assay can detect is 138 copies/mL. A negative result does not preclude SARS-Cov-2 infection and should not be used as the sole basis for treatment or other patient management decisions. A negative result may occur with  improper specimen collection/handling, submission of specimen other than nasopharyngeal swab, presence of viral mutation(s) within the areas targeted by this assay, and inadequate number of viral copies(<138 copies/mL). A negative result must be combined with clinical observations, patient history, and epidemiological information. The expected result is Negative.  Fact Sheet for Patients:  EntrepreneurPulse.com.au  Fact Sheet for Healthcare Providers:  IncredibleEmployment.be  This test is no t yet approved or cleared by  the [employer] and  has been authorized for detection and/or diagnosis of SARS-CoV-2 by FDA under an Emergency Use Authorization (EUA). This EUA will remain  in effect (meaning this test can be used) for the duration of the COVID-19 declaration under Section 564(b)(1) of the Act, 21 U.S.C.section 360bbb-3(b)(1), unless the authorization is terminated  or revoked sooner.       Influenza A by PCR NEGATIVE NEGATIVE Final   Influenza B by PCR NEGATIVE NEGATIVE Final    Comment: (NOTE) The Xpert Xpress SARS-CoV-2/FLU/RSV plus assay is intended as an aid in the diagnosis of influenza from Nasopharyngeal swab specimens and should not be used as a sole basis for treatment. Nasal washings and aspirates are unacceptable for Xpert Xpress SARS-CoV-2/FLU/RSV testing.  Fact Sheet for Patients: EntrepreneurPulse.com.au  Fact Sheet for Healthcare Providers: IncredibleEmployment.be  This test is not yet approved or cleared by the [place] FDA and has been  authorized for detection and/or diagnosis of SARS-CoV-2 by FDA under an Emergency Use Authorization (EUA). This EUA will remain in effect (meaning this test can be used) for the duration of the COVID-19 declaration under Section 564(b)(1) of the Act, 21 U.S.C. section 360bbb-3(b)(1), unless the authorization is terminated or revoked.  Performed at [facility], [place] [address], [place], [place] [address]     RN Pressure Injury Documentation:     Estimated body mass index is 20.6 kg/m as calculated from the following:   Height as of this encounter: 5' 4"  (1.626 m).   Weight as of this encounter: 54.4 kg.  Malnutrition Type:   Malnutrition Characteristics:   Nutrition Interventions:    Radiology Studies: No results found.  Scheduled Meds:  enoxaparin (LOVENOX) injection  30 mg Subcutaneous Q24H   ferrous sulfate  325 mg Oral BID WC   loperamide  4 mg Oral QID   polycarbophil  625 mg Oral BID   venlafaxine XR  150 mg Oral Q breakfast   Continuous Infusions:  lactated ringers 125 mL/hr at 10/18/21 0600    LOS: 0 days   Kerney Elbe, DO [facility] PAGER is on [place]  If 7PM-7AM, please contact night-coverage [email or web address]

## 2021-10-18 NOTE — Progress Notes (Signed)
PROGRESS NOTE    Cynthia [name]  FMB:[id number] DOB: 1961/07/18 DOA: 10/17/2021 PCP: Denita Lung, MD   Brief Narrative:  The patient is a 60 year old Caucasian female with a past medical history significant for but not limited to anemia, anxiety and depression, bronchitis, history of Crohn's ileitis who underwent ileocecectomy and sigmoidectomy in April of this year with an ileostomy pouch, GERD, history of migraine headaches, history of heart murmur, hypertension, IBS, history of incontinence, history of MVP, RLS, history of jejunal intussusception, history of high output ileostomy who presented to the ED with issues with pouching, skin breakdown and hyper ileostomy.  She has been to the ED multiple visits for this and was admitted back in November for an AKI with high output and she was placed on Imodium and iron with improvement in her output.  She really presents today again with her ileostomy bag falling off and complaining of burning and inflammation around her ileostomy site.  She denies any fevers, chills, night sweats or decreased appetite and no flank pain.  Of note patient has not been monitoring her output at home and reports that her ostomy bag was filled with 3 forceful and at least 5 times a day will need to be emptied.  She continued to have leakage around her ostomy bag with increasing redness and excoriation.  In the ED she is given 2 L of lactated Ringer's and surgery was consulted.  She did have an AKI with a BUNs/creatinine 59/1.96 but LFTs were normal.  She also had a mildly elevated potassium and a metabolic acidosis.  She was admitted for AKI in the setting of high output ileostomy with inability to keep up with her output.  General surgery has been consulted and has made some changes.  Assessment & Plan:   Principal Problem:   AKI (acute kidney injury) ([place]) Active Problems:   Essential hypertension   Anxiety associated with depression   Hyperkalemia    Hyponatremia   High output ileostomy (HCC)   Crohn's ileitis (HCC)   AKI on suspected CKD stage IIIa in the setting of high output ileostomy Metabolic acidosis -Patient presents with a BUNs/creatinine of 59/1.96 and her BUNs/creatinine has now improved with IV fluid hydration -Recently creatinine has been around 1.2-1.4 but has been normal less than 1 earlier this year prior to her surgical intervention -She was given 2-1/2 L of lactated Ringer's boluses in the ED yesterday and then placed on maintenance IV fluid at 125 mils per hour; will give another 500 mL bolus today -Patient's metabolic acidosis is improving -We will avoid further nephrotoxic medications, contrast dyes, hypotension renally dose medications -She does have a small metabolic acidosis with a CO2 of 21, anion gap of 4, chloride level 113 now -We will continue monitor her output and continue with IV fluid hydration -Surgery is being consulted for her high output ileostomy  High output ileostomy -Still having quite a bit of drainage and glucose to have less than 1 L a day -General surgery consulted and recommending ferrous sulfate 325 mg p.o. twice daily with meals, loperamide 4 mg p.o. 4 times daily, as well as polycarbophil 6.5 mg p.o. twice daily -Continue with IV fluid hydration with lactated Ringer's at 125 MLS per hour -[facility] has been consulted for colostomy care and they are recommending lidocaine 4% cream topically 4 times daily as needed for pain control edema at ileostomy site and they are recommending applying to around the ileostomy site as needed as well as Benadryl  cream 3 times daily as needed itching the painful erythema on ileostomy site -General surgery is titrating medications for her high output ileostomy given the goal is an output of less than 1 L a day -If she continues to have significant output they are considering Lomotil -WBC went from 11.2 and is now 7.1 -They have reached out to her primary team at  [facility] to arrange outpatient follow-up -She will be referred to the ostomy clinic and case manager has been consulted for help with transportation for appointments  Essential Hypertension -Given her AKI we will hold her losartan-HCTZ 50-12.5 mg tablets -We will continue monitor blood pressures per protocol blood pressures have been on the softer side -Last blood pressure reading was 89/62 so we will give her another bolus of 500 mL of lactated Ringer's  Hyperkalemia -In the setting of her renal dysfunction from her high output ileostomy -Continue IV fluid hydration as above -K+ is improving and went from 5.3 is now 3.8 -Continue to monitor  Hyponatremia -Improved. Na+ Went from 133 -> 138 -C/w IVF hydration as above -Continue to Monitor and Trend and Repeat CMP in the AM   Normocytic anemia/anemia of chronic kidney disease -Patient's hemoglobin/hematocrit went from 11.0/34.5 and now trended down to 9.9/31.2 -Check anemia panel in the a.m. -Continue to monitor for signs and symptoms of bleeding; currently no overt bleeding noted -Repeat CBC in the a.m.  Depression and anxiety -Continue with venlafaxine XR 150 mg p.o. daily  DVT prophylaxis: Enoxaparin 40 mg sq q24 Code Status: FULL CODE Family Communication: No family present at bedside  Disposition Plan: Pending further clinical improvement and improvement in her high output ileostomy; she needs to have less than a liter day prior to being safely discharged  Status is: Observation  The patient will require care spanning > 2 midnights and should be moved to inpatient because: She continues have significant amount of output from her ostomy  Consultants:  General Surgery   Procedures: None  Antimicrobials:  Anti-infectives (From admission, onward)    None        Subjective: Seen and examined at bedside and still has quite a bit of output from her ostomy already.  Has been changed multiple times by nursing this AM.  No  chest pain or shortness breath.  Feels okay but feels fatigued and little tired.  The pain in her ostomy site is improving.  She is tolerating her diet without nausea or vomiting.  Objective: Vitals:   10/17/21 2331 10/18/21 0326 10/18/21 0742 10/18/21 1332  BP: (!) 85/56 (!) 92/57 (!) 89/62 111/66  Pulse: 87 81 73 67  Resp: 18 18 18 18   Temp: 98.2 F (36.8 C) 98 F (36.7 C) 99.1 F (37.3 C) 98.2 F (36.8 C)  TempSrc:   Oral Oral  SpO2: 98% 100% 100% 100%  Weight:      Height:        Intake/Output Summary (Last 24 hours) at 10/18/2021 1346 Last data filed at 10/18/2021 1259 Gross per 24 hour  Intake 3928.23 ml  Output 3600 ml  Net 328.23 ml    Filed Weights   10/17/21 0455  Weight: 54.4 kg   Examination: Physical Exam:  Constitutional: Thin Caucasian female in no acute distress resting in bed Eyes: Lids and conjunctivae normal, sclerae anicteric  ENMT: External Ears, Nose appear normal. Grossly normal hearing. Mucous membranes are moist.  Neck: Appears normal, supple, no cervical masses, normal ROM, no appreciable thyromegaly; no appreciable JVD Respiratory: Diminished  to auscultation bilaterally, no wheezing, rales, rhonchi or crackles. Normal respiratory effort and patient is not tachypenic. No accessory muscle use.  Unlabored breathing Cardiovascular: RRR, no murmurs / rubs / gallops. S1 and S2 auscultated.  Abdomen: Soft, mildly-tender, non-distended.  Ostomy in place and she has quite a bit of stool in her bag already bowel sounds positive.  GU: Deferred. Musculoskeletal: No clubbing / cyanosis of digits/nails. No joint deformity upper and lower extremities.  Skin: No rashes, lesions, ulcers on limited skin evaluation. No induration; Warm and dry.  Neurologic: CN 2-12 grossly intact with no focal deficits.  Romberg sign and cerebellar reflexes not assessed.  Psychiatric: Normal judgment and insight. Alert and oriented x 3. Normal mood and appropriate affect.   Data  Reviewed: I have personally reviewed following labs and imaging studies  CBC: Recent Labs  Lab 10/17/21 0743 10/18/21 0554  WBC 11.2* 7.1  NEUTROABS 8.2*  --   HGB 11.0* 9.9*  HCT 34.5* 31.2*  MCV 96.1 96.0  PLT 350 680    Basic Metabolic Panel: Recent Labs  Lab 10/17/21 0743 10/18/21 0554  NA 133* 138  K 5.3* 3.8  CL 111 113*  CO2 15* 21*  GLUCOSE 97 78  BUN 59* 39*  CREATININE 1.96* 1.63*  CALCIUM 9.1 8.5*    GFR: Estimated Creatinine Clearance: 31.1 mL/min (A) (by C-G formula based on SCr of 1.63 mg/dL (H)). Liver Function Tests: Recent Labs  Lab 10/17/21 0743  AST 30  ALT 17  ALKPHOS 62  BILITOT 1.0  PROT 7.7  ALBUMIN 4.1    No results for input(s): LIPASE, AMYLASE in the last 168 hours. No results for input(s): AMMONIA in the last 168 hours. Coagulation Profile: No results for input(s): INR, PROTIME in the last 168 hours. Cardiac Enzymes: No results for input(s): CKTOTAL, CKMB, CKMBINDEX, TROPONINI in the last 168 hours. BNP (last 3 results) No results for input(s): PROBNP in the last 8760 hours. HbA1C: No results for input(s): HGBA1C in the last 72 hours. CBG: Recent Labs  Lab 10/18/21 0010  GLUCAP 132*    Lipid Profile: No results for input(s): CHOL, HDL, LDLCALC, TRIG, CHOLHDL, LDLDIRECT in the last 72 hours. Thyroid Function Tests: No results for input(s): TSH, T4TOTAL, FREET4, T3FREE, THYROIDAB in the last 72 hours. Anemia Panel: No results for input(s): VITAMINB12, FOLATE, FERRITIN, TIBC, IRON, RETICCTPCT in the last 72 hours. Sepsis Labs: No results for input(s): PROCALCITON, LATICACIDVEN in the last 168 hours.  Recent Results (from the past 240 hour(s))  Resp Panel by RT-PCR (Flu A&B, Covid) Nasopharyngeal Swab     Status: None   Collection Time: 10/17/21  1:53 PM   Specimen: Nasopharyngeal Swab; Nasopharyngeal(NP) swabs in vial transport medium  Result Value Ref Range Status   SARS Coronavirus 2 by RT PCR NEGATIVE NEGATIVE Final     Comment: (NOTE) SARS-CoV-2 target nucleic acids are NOT DETECTED.  The SARS-CoV-2 RNA is generally detectable in upper respiratory specimens during the acute phase of infection. The lowest concentration of SARS-CoV-2 viral copies this assay can detect is 138 copies/mL. A negative result does not preclude SARS-Cov-2 infection and should not be used as the sole basis for treatment or other patient management decisions. A negative result may occur with  improper specimen collection/handling, submission of specimen other than nasopharyngeal swab, presence of viral mutation(s) within the areas targeted by this assay, and inadequate number of viral copies(<138 copies/mL). A negative result must be combined with clinical observations, patient history, and epidemiological information.  The expected result is Negative.  Fact Sheet for Patients:  EntrepreneurPulse.com.au  Fact Sheet for Healthcare Providers:  IncredibleEmployment.be  This test is no t yet approved or cleared by the [place] FDA and  has been authorized for detection and/or diagnosis of SARS-CoV-2 by FDA under an Emergency Use Authorization (EUA). This EUA will remain  in effect (meaning this test can be used) for the duration of the COVID-19 declaration under Section 564(b)(1) of the Act, 21 U.S.C.section 360bbb-3(b)(1), unless the authorization is terminated  or revoked sooner.       Influenza A by PCR NEGATIVE NEGATIVE Final   Influenza B by PCR NEGATIVE NEGATIVE Final    Comment: (NOTE) The Xpert Xpress SARS-CoV-2/FLU/RSV plus assay is intended as an aid in the diagnosis of influenza from Nasopharyngeal swab specimens and should not be used as a sole basis for treatment. Nasal washings and aspirates are unacceptable for Xpert Xpress SARS-CoV-2/FLU/RSV testing.  Fact Sheet for Patients: EntrepreneurPulse.com.au  Fact Sheet for Healthcare  Providers: IncredibleEmployment.be  This test is not yet approved or cleared by the [place] FDA and has been authorized for detection and/or diagnosis of SARS-CoV-2 by FDA under an Emergency Use Authorization (EUA). This EUA will remain in effect (meaning this test can be used) for the duration of the COVID-19 declaration under Section 564(b)(1) of the Act, 21 U.S.C. section 360bbb-3(b)(1), unless the authorization is terminated or revoked.  Performed at [facility], [place] [address] [place], [place] [address]      RN Pressure Injury Documentation:     Estimated body mass index is 20.6 kg/m as calculated from the following:   Height as of this encounter: 5' 4"  (1.626 m).   Weight as of this encounter: 54.4 kg.  Malnutrition Type:   Malnutrition Characteristics:   Nutrition Interventions:    Radiology Studies: No results found.  Scheduled Meds:  enoxaparin (LOVENOX) injection  30 mg Subcutaneous Q24H   ferrous sulfate  325 mg Oral BID WC   loperamide  4 mg Oral QID   polycarbophil  625 mg Oral BID   venlafaxine XR  150 mg Oral Q breakfast   Continuous Infusions:  lactated ringers     lactated ringers 125 mL/hr at 10/18/21 0600    LOS: 0 days   Kerney Elbe, DO [facility] PAGER is on [place]  If 7PM-7AM, please contact night-coverage [email or web address]

## 2021-10-18 NOTE — TOC Initial Note (Signed)
Transition of Care [facility]) - Initial/Assessment Note    Patient Details  Name: Cynthia [name] MRN: [id number] Date of Birth: 01/29/1961  Transition of Care [facility]) CM/SW Contact:    Cynthia Mage, LCSW Phone Number: 10/18/2021, 3:44 PM  Clinical Narrative:  Patient seen in follow up to MD consult for social needs.  [name] lives by herself here in [place], has a daughter in the area from whom she is estranged, and cites one other friend as supportive.  She was recently approved for disability and MCD, but has not yet begun using it as it is new to her.  Her biggest need is transportation to medical appointments, and  I gave her information about signing up for that.  She also stated that her osotomy supplies have been affordable.  I assured her that with MCD she will abe able to afford all needed medications and supplies.  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  I told her she would need to get set up with her MCD MD and then find out through that office or her MCD case manager about where to go for eye exam and glasses. TOC will continue to follow during the course of hospitalization.               Expected Discharge Plan: Home/Self Care Barriers to Discharge: No Barriers Identified   Patient Goals and CMS Choice        Expected Discharge Plan and Services Expected Discharge Plan: Home/Self Care In-house Referral: Clinical Social Work     Living arrangements for the past 2 months: Single Family Home                                      Prior Living Arrangements/Services Living arrangements for the past 2 months: Single Family Home Lives with:: Self Patient language and need for interpreter reviewed:: Yes        Need for Family Participation in Patient Care: Yes (Comment) Care giver support system in place?: No (comment)   Criminal Activity/Legal Involvement Pertinent to Current Situation/Hospitalization: No - Comment as  needed  Activities of Daily Living Home Assistive Devices/Equipment: None ADL Screening (condition at time of admission) Patient's cognitive ability adequate to safely complete daily activities?: Yes Is the patient deaf or have difficulty hearing?: No Does the patient have difficulty seeing, even when wearing glasses/contacts?: No Does the patient have difficulty concentrating, remembering, or making decisions?: No Patient able to express need for assistance with ADLs?: Yes Does the patient have difficulty dressing or bathing?: No Independently performs ADLs?: Yes (appropriate for developmental age) Does the patient have difficulty walking or climbing stairs?: No Weakness of Legs: None Weakness of Arms/Hands: None  Permission Sought/Granted                  Emotional Assessment Appearance:: Appears stated age Attitude/Demeanor/Rapport: Engaged Affect (typically observed): Appropriate Orientation: : Oriented to Self, Oriented to Place, Oriented to  Time, Oriented to Situation Alcohol / Substance Use: Not Applicable Psych Involvement: No (comment)  Admission diagnosis:  AKI (acute kidney injury) ([place]) [N17.9] High output ileostomy ([place]) [R19.8, Z93.2] Patient Active Problem List   Diagnosis Date Noted   Crohn's ileitis ([place])    High output ileostomy ([place]) 09/26/2021   Acute renal failure (ARF) ([place]) 08/12/2021   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[place])    Protein-calorie malnutrition, severe  07/11/2021   Hypotension 07/10/2021   High anion gap metabolic acidosis 75/17/0017   Acute renal failure, unspecified acute renal failure type ([place]) 07/10/2021   Shock circulatory ([place])    Nausea and vomiting 06/26/2021   Abdominal pain 06/26/2021   Sepsis ([place]) 06/26/2021   Sepsis due to cellulitis ([place]) 04/05/2021   AKI (acute kidney injury) ([place]) 04/05/2021   Hyperkalemia 04/05/2021   Hyponatremia 04/05/2021   Adjustment disorder 03/14/2021   Unspecified severe protein-calorie malnutrition  ([place]) 03/13/2021   COVID-19 01/25/2021   Leukocytosis 01/24/2021   Crohn's colitis, with intestinal obstruction ([place]) 01/24/2021   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   History of rectal polyp 2020 01/12/2021   Anxiety associated with depression 01/12/2021   Hypokalemia 12/04/2020   Cough 11/27/2020   Otalgia of both ears 11/27/2020   Body aches 11/27/2020   Fever 11/27/2020   High risk medication use 11/27/2020   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[place]) 06/27/2020   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   Crohn's ileitis, with intestinal obstruction ([place]) 10/05/2019   Colitis    Rectal polyp    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   RLS (restless legs syndrome) 09/25/2014   Current smoker 09/19/2011   Migraine headache 09/19/2011   Essential hypertension 09/19/2011   Obesity (BMI 30-39.9) 09/19/2011   History of serrated colonic polyp 01/13/2011   Gastroesophageal reflux disease 03/17/2008   MVP (mitral valve prolapse) 03/17/2008   HIATAL HERNIA 05/03/2002   PCP:  Denita Lung, MD Pharmacy:   [id number] [place], [place] [address] [place] [address] Phone: [phone] Fax: [phone]     Social Determinants of Health (SDOH) Interventions    Readmission Risk Interventions Readmission Risk Prevention Plan 08/20/2021 04/09/2021  Transportation Screening Complete Complete  Medication Review ([place]) Complete Complete  PCP or Specialist appointment within 3-5 days of discharge Complete Complete  HRI or Home Care Consult Complete Complete  SW Recovery Care/Counseling Consult Complete Complete  Palliative Care Screening Not Applicable Not [place] Not Applicable Not Applicable  Some recent data might be hidden

## 2021-10-19 LAB — CBC WITH DIFFERENTIAL/PLATELET
Abs Immature Granulocytes: 0.06 10*3/uL (ref 0.00–0.07)
Basophils Absolute: 0 10*3/uL (ref 0.0–0.1)
Basophils Relative: 0 %
Eosinophils Absolute: 0.5 10*3/uL (ref 0.0–0.5)
Eosinophils Relative: 7 %
HCT: 28 % — ABNORMAL LOW (ref 36.0–46.0)
Hemoglobin: 8.7 g/dL — ABNORMAL LOW (ref 12.0–15.0)
Immature Granulocytes: 1 %
Lymphocytes Relative: 26 %
Lymphs Abs: 1.9 10*3/uL (ref 0.7–4.0)
MCH: 30.3 pg (ref 26.0–34.0)
MCHC: 31.1 g/dL (ref 30.0–36.0)
MCV: 97.6 fL (ref 80.0–100.0)
Monocytes Absolute: 0.5 10*3/uL (ref 0.1–1.0)
Monocytes Relative: 7 %
Neutro Abs: 4.3 10*3/uL (ref 1.7–7.7)
Neutrophils Relative %: 59 %
Platelets: 260 10*3/uL (ref 150–400)
RBC: 2.87 MIL/uL — ABNORMAL LOW (ref 3.87–5.11)
RDW: 13 % (ref 11.5–15.5)
WBC: 7.3 10*3/uL (ref 4.0–10.5)
nRBC: 0 % (ref 0.0–0.2)

## 2021-10-19 LAB — MAGNESIUM: Magnesium: 1.7 mg/dL (ref 1.7–2.4)

## 2021-10-19 LAB — COMPREHENSIVE METABOLIC PANEL
ALT: 16 U/L (ref 0–44)
AST: 18 U/L (ref 15–41)
Albumin: 2.8 g/dL — ABNORMAL LOW (ref 3.5–5.0)
Alkaline Phosphatase: 45 U/L (ref 38–126)
Anion gap: 3 — ABNORMAL LOW (ref 5–15)
BUN: 35 mg/dL — ABNORMAL HIGH (ref 8–23)
CO2: 20 mmol/L — ABNORMAL LOW (ref 22–32)
Calcium: 8.5 mg/dL — ABNORMAL LOW (ref 8.9–10.3)
Chloride: 113 mmol/L — ABNORMAL HIGH (ref 98–111)
Creatinine, Ser: 1.52 mg/dL — ABNORMAL HIGH (ref 0.44–1.00)
GFR, Estimated: 39 mL/min — ABNORMAL LOW (ref >60)
Glucose, Bld: 77 mg/dL (ref 70–99)
Potassium: 4.6 mmol/L (ref 3.5–5.1)
Sodium: 136 mmol/L (ref 135–145)
Total Bilirubin: 0.6 mg/dL (ref 0.3–1.2)
Total Protein: 5.4 g/dL — ABNORMAL LOW (ref 6.5–8.1)

## 2021-10-19 LAB — RETICULOCYTES
Immature Retic Fract: 5.1 % (ref 2.3–15.9)
RBC.: 2.86 MIL/uL — ABNORMAL LOW (ref 3.87–5.11)
Retic Count, Absolute: 30 10*3/uL (ref 19.0–186.0)
Retic Ct Pct: 1.1 % (ref 0.4–3.1)

## 2021-10-19 LAB — FERRITIN: Ferritin: 43 ng/mL (ref 11–307)

## 2021-10-19 LAB — PHOSPHORUS: Phosphorus: 2.9 mg/dL (ref 2.5–4.6)

## 2021-10-19 LAB — IRON AND TIBC
Iron: 82 ug/dL (ref 28–170)
Saturation Ratios: 25 % (ref 10.4–31.8)
TIBC: 323 ug/dL (ref 250–450)
UIBC: 241 ug/dL

## 2021-10-19 LAB — VITAMIN B12: Vitamin B-12: 134 pg/mL — ABNORMAL LOW (ref 180–914)

## 2021-10-19 LAB — FOLATE: Folate: 8.3 ng/mL (ref >5.9)

## 2021-10-19 MED ORDER — ENOXAPARIN SODIUM 40 MG/0.4ML IJ SOSY
40.0000 mg | PREFILLED_SYRINGE | INTRAMUSCULAR | Status: DC
  Administered 2021-10-19 – 2021-10-26 (×8): 40 mg via SUBCUTANEOUS
  Filled 2021-10-19 (×8): qty 0.4

## 2021-10-19 MED ORDER — MAGNESIUM SULFATE 2 GM/50ML IV SOLN
2.0000 g | Freq: Once | INTRAVENOUS | Status: AC
  Administered 2021-10-19: 2 g via INTRAVENOUS
  Filled 2021-10-19: qty 50

## 2021-10-19 NOTE — Progress Notes (Signed)
° °  Subjective/Chief Complaint: During day had fair amount output but thicker now and less overnight, otherwise doing well today, no pain, eating   Objective: Vital signs in last 24 hours: Temp:  [97.6 F (36.4 C)-98.2 F (36.8 C)] 97.6 F (36.4 C) (12/16 2001) Pulse Rate:  [67-74] 69 (12/17 0455) Resp:  [18] 18 (12/17 0455) BP: (105-111)/(66-75) 109/75 (12/17 0455) SpO2:  [100 %] 100 % (12/17 0455) Last BM Date: 10/17/21  Intake/Output from previous day: 12/16 0701 - 12/17 0700 In: 960 [P.O.:960] Out: 4650 [Urine:700; Stool:3950] Intake/Output this shift: No intake/output data recorded.  Abd: Soft, ND, +BS, ileostomy pink and functional. There is liquid and some thicker stool in ostomy bag. Skin erythema/ skin excoriation along the right side of her abdomen extending to the right flank is already improving.   Lab Results:  Recent Labs    10/17/21 0743 10/18/21 0554  WBC 11.2* 7.1  HGB 11.0* 9.9*  HCT 34.5* 31.2*  PLT 350 309   BMET Recent Labs    10/17/21 0743 10/18/21 0554  NA 133* 138  K 5.3* 3.8  CL 111 113*  CO2 15* 21*  GLUCOSE 97 78  BUN 59* 39*  CREATININE 1.96* 1.63*  CALCIUM 9.1 8.5*   PT/INR No results for input(s): LABPROT, INR in the last 72 hours. ABG No results for input(s): PHART, HCO3 in the last 72 hours.  Invalid input(s): PCO2, PO2  Studies/Results: No results found.  Anti-infectives: Anti-infectives (From admission, onward)    None       Assessment/Plan: High output Ileostomy  Pouching Issues with Skin irritation/breakdown Hx of Crohn's s/p ileocecectomy and sigmoidectomy in 02/2021 by Dr. Hilliard Clark at [facility] following for assistance with pouching and skin barriers to assist with skin irritation/breakdown from leaking of ileostomy pouching system. No leakage from ileostomy today. I have asked her RN to obtained ostomy belt that was ordered by [facility] and apply. Stressed importance of emptying ostomy bag to patient when it  is 1/3 to 1/2 full and not waiting till it is completely full.  - Titrate medications for high output ileostomy. Increase imodium to 32m QID and add iron BID. Cont fibercon BID. Goal output <1L. Consider adding Lomotil if continues to have high output. Was previously on Questran during prior admission but has not been taking this at home. I also recommend that she avoid sugary drinks, such as the sweet tea she has been drinking at home, as this can contribute to high output. Will hold off on any changes as things look better than prior 24 hours and changes may be starting to work, if not by tomorrow will continue to add more.  - EDP reached out to the patient's surgeon, Dr. HHilliard Clark of [facility] and spoke with his team. Per my conversation with EDP, Dr. HArdath Saxteam will reach out to the patient to arrange outpatient follow up - Patient is not currently followed by any GI practice for hx of Crohn's. She previously was followed by [facility] but due to multiple no shows she was discharged from the practice. [facility] - Will refer to ostomy clinic again - Will benefit for case manager to see her to see if they can help her with transportation assistance to and from her appointments etc. Consult placed    MRolm Bookbinder12/17/2022

## 2021-10-19 NOTE — Progress Notes (Incomplete Revision)
PROGRESS NOTE    Cynthia [name]  TGY:[id number] DOB: [birth year] DOA: 10/17/2021 PCP: Denita Lung, MD   Brief Narrative:  The patient is a 60 year old Caucasian female with a past medical history significant for but not limited to anemia, anxiety and depression, bronchitis, history of Crohn's ileitis who underwent ileocecectomy and sigmoidectomy in April of this year with an ileostomy pouch, GERD, history of migraine headaches, history of heart murmur, hypertension, IBS, history of incontinence, history of MVP, RLS, history of jejunal intussusception, history of high output ileostomy who presented to the ED with issues with pouching, skin breakdown and hyper ileostomy.  She has been to the ED multiple visits for this and was admitted back in November for an AKI with high output and she was placed on Imodium and iron with improvement in her output.  She really presents today again with her ileostomy bag falling off and complaining of burning and inflammation around her ileostomy site.  She denies any fevers, chills, night sweats or decreased appetite and no flank pain.  Of note patient has not been monitoring her output at home and reports that her ostomy bag was filled with 3 forceful and at least 5 times a day will need to be emptied.  She continued to have leakage around her ostomy bag with increasing redness and excoriation.  In the ED she is given 2 L of lactated Ringer's and surgery was consulted.  She did have an AKI with a BUNs/creatinine 59/1.96 but LFTs were normal.  She also had a mildly elevated potassium and a metabolic acidosis.  She was admitted for AKI in the setting of high output ileostomy with inability to keep up with her output.  General surgery has been consulted and has made some changes by increasing her Imodium to 4 mg 4 times daily and adding iron twice daily.  They are recommending continue FiberCon twice daily the goal of less than 1 L out a day.  Given that she is improving  a little bit there we will hold off on any changes as things better than prior 24 hours but if the changes are not significantly improving her diarrheal output they will add more tomorrow.  Assessment & Plan:   Principal Problem:   AKI (acute kidney injury) ([place]) Active Problems:   Essential hypertension   Anxiety associated with depression   Hyperkalemia   Hyponatremia   High output ileostomy (HCC)   Crohn's ileitis ([place])   AKI on suspected CKD stage IIIa in the setting of high output ileostomy Metabolic acidosis -Patient presents with a BUNs/creatinine of 59/1.96 and her BUNs/creatinine has now improved with IV fluid hydration.  Now her BUNs/creatinine is 35/1.52 -Recently creatinine has been around 1.2-1.4 but has been normal less than 1 earlier this year prior to her surgical intervention -She was given 2-1/2 L of lactated Ringer's boluses in the ED yesterday and then placed on maintenance IV fluid at 125 mils per hour; she is given another 500 mL bolus yesterday -Patient's metabolic acidosis is improving -We will avoid further nephrotoxic medications, contrast dyes, hypotension renally dose medications -She does have a small metabolic acidosis with a CO2 of 21, anion gap of 4, chloride level 113 yesterday and today it is showing a CO2 of 20, anion gap of 3, chloride level 113 -We will continue monitor her output and continue with IV fluid hydration -Surgery is being consulted for her high output ileostomy  High output ileostomy -Still having quite a bit of drainage  and glucose to have less than 1 L a day -General surgery consulted and recommending ferrous sulfate 325 mg p.o. twice daily with meals, loperamide 4 mg p.o. 4 times daily, as well as polycarbophil 6.5 mg p.o. twice daily and general surgery recommends making no changes today -Continue with IV fluid hydration with lactated Ringer's at 125 MLS per hour -[facility] has been consulted for colostomy care and they are recommending  lidocaine 4% cream topically 4 times daily as needed for pain control edema at ileostomy site and they are recommending applying to around the ileostomy site as needed as well as Benadryl cream 3 times daily as needed itching the painful erythema on ileostomy site -General surgery is titrating medications for her high output ileostomy given the goal is an output of less than 1 L a day -If she continues to have significant output they are considering Lomotil -WBC went from 11.2 and is now 7.1 yesterday and today was 7.3 -They have reached out to her primary team at [facility] to arrange outpatient follow-up -She will be referred to the ostomy clinic and case manager has been consulted for help with transportation for appointments  Essential Hypertension -Given her AKI we will hold her losartan-HCTZ 50-12.5 mg tablets -We will continue monitor blood pressures per protocol blood pressures have been on the softer side -She is given another 500 mL bolus yesterday but her blood pressure today is now 110/67  Hyperkalemia -In the setting of her renal dysfunction from her high output ileostomy -Continue IV fluid hydration as above -K+ is improving and went from 5.3 is now 3.8 yesterday and today is 4.6 -Continue to monitor  Hyponatremia -Improved. Na+ Went from 133 -> 138 and today is 136 -C/w IVF hydration as above -Continue to Monitor and Trend and Repeat CMP in the AM   Normocytic anemia/anemia of chronic kidney disease -Patient's hemoglobin/hematocrit went from 11.0/34.5 and now trended down to 9.9/31.2 yesterday and today is 8.7/28.0 and likely dilutional drop -Anemia panel done and checked and showed an iron level 82, U IBC 241, TIBC 323, saturation ratio 25% of ferritin level 43, folate of 8.3, and vitamin B12 134 -Continue to monitor for signs and symptoms of bleeding; currently no overt bleeding noted -Repeat CBC in the a.m.  Depression and anxiety -Continue with venlafaxine XR 150 mg p.o.  daily  DVT prophylaxis: Enoxaparin 40 mg sq q24 Code Status: FULL CODE Family Communication: No family present at bedside  Disposition Plan: Pending further clinical improvement and improvement in her high output ileostomy; she needs to have less than a liter day prior to being safely discharged  Status is: Observation  The patient will require care spanning > 2 midnights and should be moved to inpatient because: She continues have significant amount of output from her ostomy  Consultants:  General Surgery   Procedures: None  Antimicrobials:  Anti-infectives (From admission, onward)    None        Subjective: Seen and examined at bedside and states that she did not get any rest last night.  Continues to still have quite a bit of output but a little bit thicker than yesterday.  Denies any nausea and states her pain is improving slightly.  No chest pain or shortness of breath.  No other concerns or complaints at this time.  Objective: Vitals:   10/18/21 1332 10/18/21 2001 10/19/21 0455 10/19/21 1252  BP: 111/66 105/69 109/75 110/67  Pulse: 67 74 69 78  Resp: 18 18 18  18  Temp: 98.2 F (36.8 C) 97.6 F (36.4 C)  97.8 F (36.6 C)  TempSrc: Oral Oral  Oral  SpO2: 100% 100% 100% 100%  Weight:      Height:        Intake/Output Summary (Last 24 hours) at 10/19/2021 1321 Last data filed at 10/19/2021 1230 Gross per 24 hour  Intake 720 ml  Output 3150 ml  Net -2430 ml    Filed Weights   10/17/21 0455  Weight: 54.4 kg   Examination: Physical Exam:  Constitutional: Thin Caucasian female currently in no acute distress resting in bed Eyes: Lids and conjunctivae normal, sclerae anicteric  ENMT: External Ears, Nose appear normal. Grossly normal hearing. Mucous membranes are moist.  Neck: Appears normal, supple, no cervical masses, normal ROM, no appreciable thyromegaly; no appreciable JVD Respiratory: Diminished to auscultation bilaterally, no wheezing, rales, rhonchi or  crackles. Normal respiratory effort and patient is not tachypenic. No accessory muscle use.  Unlabored breathing Cardiovascular: RRR, no murmurs / rubs / gallops. S1 and S2 auscultated.  Abdomen: Soft, mildly-tender, non-distended.  Ostomy is in place and has quite a bit of liquid stool and some thicker pieces in it.  Bowel sounds positive.  GU: Deferred. Musculoskeletal: No clubbing / cyanosis of digits/nails. No joint deformity upper and lower extremities.  Skin: No rashes, lesions, ulcers. No induration; Warm and dry.  Neurologic: CN 2-12 grossly intact with no focal deficits.  Romberg sign and cerebellar reflexes not assessed.  Psychiatric: Normal judgment and insight. Alert and oriented x 3. Normal mood and appropriate affect.   Data Reviewed: I have personally reviewed following labs and imaging studies  CBC: Recent Labs  Lab 10/17/21 0743 10/18/21 0554 10/19/21 0644  WBC 11.2* 7.1 7.3  NEUTROABS 8.2*  --  4.3  HGB 11.0* 9.9* 8.7*  HCT 34.5* 31.2* 28.0*  MCV 96.1 96.0 97.6  PLT 350 309 952    Basic Metabolic Panel: Recent Labs  Lab 10/17/21 0743 10/18/21 0554 10/19/21 0644  NA 133* 138 136  K 5.3* 3.8 4.6  CL 111 113* 113*  CO2 15* 21* 20*  GLUCOSE 97 78 77  BUN 59* 39* 35*  CREATININE 1.96* 1.63* 1.52*  CALCIUM 9.1 8.5* 8.5*  MG  --   --  1.7  PHOS  --   --  2.9    GFR: Estimated Creatinine Clearance: 33.4 mL/min (A) (by C-G formula based on SCr of 1.52 mg/dL (H)). Liver Function Tests: Recent Labs  Lab 10/17/21 0743 10/19/21 0644  AST 30 18  ALT 17 16  ALKPHOS 62 45  BILITOT 1.0 0.6  PROT 7.7 5.4*  ALBUMIN 4.1 2.8*    No results for input(s): LIPASE, AMYLASE in the last 168 hours. No results for input(s): AMMONIA in the last 168 hours. Coagulation Profile: No results for input(s): INR, PROTIME in the last 168 hours. Cardiac Enzymes: No results for input(s): CKTOTAL, CKMB, CKMBINDEX, TROPONINI in the last 168 hours. BNP (last 3 results) No  results for input(s): PROBNP in the last 8760 hours. HbA1C: No results for input(s): HGBA1C in the last 72 hours. CBG: Recent Labs  Lab 10/18/21 0010  GLUCAP 132*    Lipid Profile: No results for input(s): CHOL, HDL, LDLCALC, TRIG, CHOLHDL, LDLDIRECT in the last 72 hours. Thyroid Function Tests: No results for input(s): TSH, T4TOTAL, FREET4, T3FREE, THYROIDAB in the last 72 hours. Anemia Panel: Recent Labs    10/19/21 0644  VITAMINB12 134*  FOLATE 8.3  FERRITIN 43  TIBC 323  IRON 82  RETICCTPCT 1.1   Sepsis Labs: No results for input(s): PROCALCITON, LATICACIDVEN in the last 168 hours.  Recent Results (from the past 240 hour(s))  Resp Panel by RT-PCR (Flu A&B, Covid) Nasopharyngeal Swab     Status: None   Collection Time: 10/17/21  1:53 PM   Specimen: Nasopharyngeal Swab; Nasopharyngeal(NP) swabs in vial transport medium  Result Value Ref Range Status   SARS Coronavirus 2 by RT PCR NEGATIVE NEGATIVE Final    Comment: (NOTE) SARS-CoV-2 target nucleic acids are NOT DETECTED.  The SARS-CoV-2 RNA is generally detectable in upper respiratory specimens during the acute phase of infection. The lowest concentration of SARS-CoV-2 viral copies this assay can detect is 138 copies/mL. A negative result does not preclude SARS-Cov-2 infection and should not be used as the sole basis for treatment or other patient management decisions. A negative result may occur with  improper specimen collection/handling, submission of specimen other than nasopharyngeal swab, presence of viral mutation(s) within the areas targeted by this assay, and inadequate number of viral copies(<138 copies/mL). A negative result must be combined with clinical observations, patient history, and epidemiological information. The expected result is Negative.  Fact Sheet for Patients:  EntrepreneurPulse.com.au  Fact Sheet for Healthcare Providers:   IncredibleEmployment.be  This test is no t yet approved or cleared by the [place] FDA and  has been authorized for detection and/or diagnosis of SARS-CoV-2 by FDA under an Emergency Use Authorization (EUA). This EUA will remain  in effect (meaning this test can be used) for the duration of the COVID-19 declaration under Section 564(b)(1) of the Act, 21 U.S.C.section 360bbb-3(b)(1), unless the authorization is terminated  or revoked sooner.       Influenza A by PCR NEGATIVE NEGATIVE Final   Influenza B by PCR NEGATIVE NEGATIVE Final    Comment: (NOTE) The Xpert Xpress SARS-CoV-2/FLU/RSV plus assay is intended as an aid in the diagnosis of influenza from Nasopharyngeal swab specimens and should not be used as a sole basis for treatment. Nasal washings and aspirates are unacceptable for Xpert Xpress SARS-CoV-2/FLU/RSV testing.  Fact Sheet for Patients: EntrepreneurPulse.com.au  Fact Sheet for Healthcare Providers: IncredibleEmployment.be  This test is not yet approved or cleared by the [place] FDA and has been authorized for detection and/or diagnosis of SARS-CoV-2 by FDA under an Emergency Use Authorization (EUA). This EUA will remain in effect (meaning this test can be used) for the duration of the COVID-19 declaration under Section 564(b)(1) of the Act, 21 U.S.C. section 360bbb-3(b)(1), unless the authorization is terminated or revoked.  Performed at [facility], [place] [address] [place], [place] [address]      RN Pressure Injury Documentation:     Estimated body mass index is 20.6 kg/m as calculated from the following:   Height as of this encounter: 5' 4"  (1.626 m).   Weight as of this encounter: 54.4 kg.  Malnutrition Type:   Malnutrition Characteristics:   Nutrition Interventions:    Radiology Studies: No results found.  Scheduled Meds:  enoxaparin (LOVENOX) injection   30 mg Subcutaneous Q24H   ferrous sulfate  325 mg Oral BID WC   loperamide  4 mg Oral QID   polycarbophil  625 mg Oral BID   venlafaxine XR  150 mg Oral Q breakfast   Continuous Infusions:  lactated ringers 125 mL/hr at 10/19/21 0200    LOS: 1 day   Kerney Elbe, DO [facility] PAGER is on [place] [address]  If 7PM-7AM, please  contact night-coverage [email or web address]

## 2021-10-19 NOTE — Progress Notes (Addendum)
PROGRESS NOTE    Cynthia [name]  GGY:[id number] DOB: 1961-10-10 DOA: 10/17/2021 PCP: Denita Lung, MD   Brief Narrative:  The patient is a 60 year old Caucasian female with a past medical history significant for but not limited to anemia, anxiety and depression, bronchitis, history of Crohn's ileitis who underwent ileocecectomy and sigmoidectomy in April of this year with an ileostomy pouch, GERD, history of migraine headaches, history of heart murmur, hypertension, IBS, history of incontinence, history of MVP, RLS, history of jejunal intussusception, history of high output ileostomy who presented to the ED with issues with pouching, skin breakdown and hyper ileostomy.  She has been to the ED multiple visits for this and was admitted back in November for an AKI with high output and she was placed on Imodium and iron with improvement in her output.  She really presents today again with her ileostomy bag falling off and complaining of burning and inflammation around her ileostomy site.  She denies any fevers, chills, night sweats or decreased appetite and no flank pain.  Of note patient has not been monitoring her output at home and reports that her ostomy bag was filled with 3 forceful and at least 5 times a day will need to be emptied.  She continued to have leakage around her ostomy bag with increasing redness and excoriation.  In the ED she is given 2 L of lactated Ringer's and surgery was consulted.  She did have an AKI with a BUNs/creatinine 59/1.96 but LFTs were normal.  She also had a mildly elevated potassium and a metabolic acidosis.  She was admitted for AKI in the setting of high output ileostomy with inability to keep up with her output.  General surgery has been consulted and has made some changes by increasing her Imodium to 4 mg 4 times daily and adding iron twice daily.  They are recommending continue FiberCon twice daily the goal of less than 1 L out a day.  Given that she is improving  a little bit there we will hold off on any changes as things better than prior 24 hours but if the changes are not significantly improving her diarrheal output they will add more tomorrow.  Assessment & Plan:   Principal Problem:   AKI (acute kidney injury) ([place]) Active Problems:   Essential hypertension   Anxiety associated with depression   Hyperkalemia   Hyponatremia   High output ileostomy (HCC)   Crohn's ileitis ([place])   AKI on suspected CKD stage IIIa in the setting of high output ileostomy Metabolic acidosis -Patient presents with a BUNs/creatinine of 59/1.96 and her BUNs/creatinine has now improved with IV fluid hydration.  Now her BUNs/creatinine is 35/1.52 -Recently creatinine has been around 1.2-1.4 but has been normal less than 1 earlier this year prior to her surgical intervention -She was given 2-1/2 L of lactated Ringer's boluses in the ED yesterday and then placed on maintenance IV fluid at 125 mils per hour; she is given another 500 mL bolus yesterday -Patient's metabolic acidosis is improving -We will avoid further nephrotoxic medications, contrast dyes, hypotension renally dose medications -She does have a small metabolic acidosis with a CO2 of 21, anion gap of 4, chloride level 113 yesterday and today it is showing a CO2 of 20, anion gap of 3, chloride level 113 -We will continue monitor her output and continue with IV fluid hydration -Surgery is being consulted for her high output ileostomy  High output ileostomy -Still having quite a bit of drainage  and glucose to have less than 1 L a day -General surgery consulted and recommending ferrous sulfate 325 mg p.o. twice daily with meals, loperamide 4 mg p.o. 4 times daily, as well as polycarbophil 6.5 mg p.o. twice daily and general surgery recommends making no changes today -Continue with IV fluid hydration with lactated Ringer's at 125 MLS per hour -[facility] has been consulted for colostomy care and they are recommending  lidocaine 4% cream topically 4 times daily as needed for pain control edema at ileostomy site and they are recommending applying to around the ileostomy site as needed as well as Benadryl cream 3 times daily as needed itching the painful erythema on ileostomy site -General surgery is titrating medications for her high output ileostomy given the goal is an output of less than 1 L a day -If she continues to have significant output they are considering Lomotil -WBC went from 11.2 and is now 7.1 yesterday and today was 7.3 -They have reached out to her primary team at [facility] to arrange outpatient follow-up -She will be referred to the ostomy clinic and case manager has been consulted for help with transportation for appointments  Essential Hypertension -Given her AKI we will hold her losartan-HCTZ 50-12.5 mg tablets -We will continue monitor blood pressures per protocol blood pressures have been on the softer side -She is given another 500 mL bolus yesterday but her blood pressure today is now 110/67  Hyperkalemia -In the setting of her renal dysfunction from her high output ileostomy -Continue IV fluid hydration as above -K+ is improving and went from 5.3 is now 3.8 yesterday and today is 4.6 -Continue to monitor  Hyponatremia -Improved. Na+ Went from 133 -> 138 and today is 136 -C/w IVF hydration as above -Continue to Monitor and Trend and Repeat CMP in the AM   Normocytic anemia/anemia of chronic kidney disease -Patient's hemoglobin/hematocrit went from 11.0/34.5 and now trended down to 9.9/31.2 yesterday and today is 8.7/28.0 and likely dilutional drop -Anemia panel done and checked and showed an iron level 82, U IBC 241, TIBC 323, saturation ratio 25% of ferritin level 43, folate of 8.3, and vitamin B12 134 -Continue to monitor for signs and symptoms of bleeding; currently no overt bleeding noted -Repeat CBC in the a.m.  Depression and anxiety -Continue with venlafaxine XR 150 mg p.o.  daily  DVT prophylaxis: Enoxaparin 40 mg sq q24 Code Status: FULL CODE Family Communication: No family present at bedside  Disposition Plan: Pending further clinical improvement and improvement in her high output ileostomy; she needs to have less than a liter day prior to being safely discharged  Status is: Observation  The patient will require care spanning > 2 midnights and should be moved to inpatient because: She continues have significant amount of output from her ostomy  Consultants:  General Surgery   Procedures: None  Antimicrobials:  Anti-infectives (From admission, onward)    None        Subjective: Seen and examined at bedside and states that she did not get any rest last night.  Continues to still have quite a bit of output but a little bit thicker than yesterday.  Denies any nausea and states her pain is improving slightly.  No chest pain or shortness of breath.  No other concerns or complaints at this time.  Objective: Vitals:   10/18/21 1332 10/18/21 2001 10/19/21 0455 10/19/21 1252  BP: 111/66 105/69 109/75 110/67  Pulse: 67 74 69 78  Resp: 18 18 18  18  Temp: 98.2 F (36.8 C) 97.6 F (36.4 C)  97.8 F (36.6 C)  TempSrc: Oral Oral  Oral  SpO2: 100% 100% 100% 100%  Weight:      Height:        Intake/Output Summary (Last 24 hours) at 10/19/2021 1321 Last data filed at 10/19/2021 1230 Gross per 24 hour  Intake 720 ml  Output 3150 ml  Net -2430 ml    Filed Weights   10/17/21 0455  Weight: 54.4 kg   Examination: Physical Exam:  Constitutional: Thin Caucasian female currently in no acute distress resting in bed Eyes: Lids and conjunctivae normal, sclerae anicteric  ENMT: External Ears, Nose appear normal. Grossly normal hearing. Mucous membranes are moist.  Neck: Appears normal, supple, no cervical masses, normal ROM, no appreciable thyromegaly; no appreciable JVD Respiratory: Diminished to auscultation bilaterally, no wheezing, rales, rhonchi or  crackles. Normal respiratory effort and patient is not tachypenic. No accessory muscle use.  Unlabored breathing Cardiovascular: RRR, no murmurs / rubs / gallops. S1 and S2 auscultated.  Abdomen: Soft, mildly-tender, non-distended.  Ostomy is in place and has quite a bit of liquid stool and some thicker pieces in it.  Bowel sounds positive.  GU: Deferred. Musculoskeletal: No clubbing / cyanosis of digits/nails. No joint deformity upper and lower extremities.  Skin: No rashes, lesions, ulcers. No induration; Warm and dry.  Neurologic: CN 2-12 grossly intact with no focal deficits.  Romberg sign and cerebellar reflexes not assessed.  Psychiatric: Normal judgment and insight. Alert and oriented x 3. Normal mood and appropriate affect.   Data Reviewed: I have personally reviewed following labs and imaging studies  CBC: Recent Labs  Lab 10/17/21 0743 10/18/21 0554 10/19/21 0644  WBC 11.2* 7.1 7.3  NEUTROABS 8.2*  --  4.3  HGB 11.0* 9.9* 8.7*  HCT 34.5* 31.2* 28.0*  MCV 96.1 96.0 97.6  PLT 350 309 032    Basic Metabolic Panel: Recent Labs  Lab 10/17/21 0743 10/18/21 0554 10/19/21 0644  NA 133* 138 136  K 5.3* 3.8 4.6  CL 111 113* 113*  CO2 15* 21* 20*  GLUCOSE 97 78 77  BUN 59* 39* 35*  CREATININE 1.96* 1.63* 1.52*  CALCIUM 9.1 8.5* 8.5*  MG  --   --  1.7  PHOS  --   --  2.9    GFR: Estimated Creatinine Clearance: 33.4 mL/min (A) (by C-G formula based on SCr of 1.52 mg/dL (H)). Liver Function Tests: Recent Labs  Lab 10/17/21 0743 10/19/21 0644  AST 30 18  ALT 17 16  ALKPHOS 62 45  BILITOT 1.0 0.6  PROT 7.7 5.4*  ALBUMIN 4.1 2.8*    No results for input(s): LIPASE, AMYLASE in the last 168 hours. No results for input(s): AMMONIA in the last 168 hours. Coagulation Profile: No results for input(s): INR, PROTIME in the last 168 hours. Cardiac Enzymes: No results for input(s): CKTOTAL, CKMB, CKMBINDEX, TROPONINI in the last 168 hours. BNP (last 3 results) No  results for input(s): PROBNP in the last 8760 hours. HbA1C: No results for input(s): HGBA1C in the last 72 hours. CBG: Recent Labs  Lab 10/18/21 0010  GLUCAP 132*    Lipid Profile: No results for input(s): CHOL, HDL, LDLCALC, TRIG, CHOLHDL, LDLDIRECT in the last 72 hours. Thyroid Function Tests: No results for input(s): TSH, T4TOTAL, FREET4, T3FREE, THYROIDAB in the last 72 hours. Anemia Panel: Recent Labs    10/19/21 0644  VITAMINB12 134*  FOLATE 8.3  FERRITIN 43  TIBC 323  IRON 82  RETICCTPCT 1.1   Sepsis Labs: No results for input(s): PROCALCITON, LATICACIDVEN in the last 168 hours.  Recent Results (from the past 240 hour(s))  Resp Panel by RT-PCR (Flu A&B, Covid) Nasopharyngeal Swab     Status: None   Collection Time: 10/17/21  1:53 PM   Specimen: Nasopharyngeal Swab; Nasopharyngeal(NP) swabs in vial transport medium  Result Value Ref Range Status   SARS Coronavirus 2 by RT PCR NEGATIVE NEGATIVE Final    Comment: (NOTE) SARS-CoV-2 target nucleic acids are NOT DETECTED.  The SARS-CoV-2 RNA is generally detectable in upper respiratory specimens during the acute phase of infection. The lowest concentration of SARS-CoV-2 viral copies this assay can detect is 138 copies/mL. A negative result does not preclude SARS-Cov-2 infection and should not be used as the sole basis for treatment or other patient management decisions. A negative result may occur with  improper specimen collection/handling, submission of specimen other than nasopharyngeal swab, presence of viral mutation(s) within the areas targeted by this assay, and inadequate number of viral copies(<138 copies/mL). A negative result must be combined with clinical observations, patient history, and epidemiological information. The expected result is Negative.  Fact Sheet for Patients:  EntrepreneurPulse.com.au  Fact Sheet for Healthcare Providers:   IncredibleEmployment.be  This test is no t yet approved or cleared by the [place] FDA and  has been authorized for detection and/or diagnosis of SARS-CoV-2 by FDA under an Emergency Use Authorization (EUA). This EUA will remain  in effect (meaning this test can be used) for the duration of the COVID-19 declaration under Section 564(b)(1) of the Act, 21 U.S.C.section 360bbb-3(b)(1), unless the authorization is terminated  or revoked sooner.       Influenza A by PCR NEGATIVE NEGATIVE Final   Influenza B by PCR NEGATIVE NEGATIVE Final    Comment: (NOTE) The Xpert Xpress SARS-CoV-2/FLU/RSV plus assay is intended as an aid in the diagnosis of influenza from Nasopharyngeal swab specimens and should not be used as a sole basis for treatment. Nasal washings and aspirates are unacceptable for Xpert Xpress SARS-CoV-2/FLU/RSV testing.  Fact Sheet for Patients: EntrepreneurPulse.com.au  Fact Sheet for Healthcare Providers: IncredibleEmployment.be  This test is not yet approved or cleared by the [place] FDA and has been authorized for detection and/or diagnosis of SARS-CoV-2 by FDA under an Emergency Use Authorization (EUA). This EUA will remain in effect (meaning this test can be used) for the duration of the COVID-19 declaration under Section 564(b)(1) of the Act, 21 U.S.C. section 360bbb-3(b)(1), unless the authorization is terminated or revoked.  Performed at [facility], [place] [address], [place], [place] [address]      RN Pressure Injury Documentation:     Estimated body mass index is 20.6 kg/m as calculated from the following:   Height as of this encounter: 5' 4"  (1.626 m).   Weight as of this encounter: 54.4 kg.  Malnutrition Type:   Malnutrition Characteristics:   Nutrition Interventions:    Radiology Studies: No results found.  Scheduled Meds:  enoxaparin (LOVENOX) injection   30 mg Subcutaneous Q24H   ferrous sulfate  325 mg Oral BID WC   loperamide  4 mg Oral QID   polycarbophil  625 mg Oral BID   venlafaxine XR  150 mg Oral Q breakfast   Continuous Infusions:  lactated ringers 125 mL/hr at 10/19/21 0200    LOS: 1 day   Kerney Elbe, DO [facility] PAGER is on [place]  If 7PM-7AM, please  contact night-coverage [email or web address]

## 2021-10-20 LAB — CBC WITH DIFFERENTIAL/PLATELET
Abs Immature Granulocytes: 0.02 10*3/uL (ref 0.00–0.07)
Basophils Absolute: 0 10*3/uL (ref 0.0–0.1)
Basophils Relative: 0 %
Eosinophils Absolute: 0.3 10*3/uL (ref 0.0–0.5)
Eosinophils Relative: 5 %
HCT: 25.6 % — ABNORMAL LOW (ref 36.0–46.0)
Hemoglobin: 8.1 g/dL — ABNORMAL LOW (ref 12.0–15.0)
Immature Granulocytes: 0 %
Lymphocytes Relative: 34 %
Lymphs Abs: 2.4 10*3/uL (ref 0.7–4.0)
MCH: 30.5 pg (ref 26.0–34.0)
MCHC: 31.6 g/dL (ref 30.0–36.0)
MCV: 96.2 fL (ref 80.0–100.0)
Monocytes Absolute: 0.4 10*3/uL (ref 0.1–1.0)
Monocytes Relative: 6 %
Neutro Abs: 3.9 10*3/uL (ref 1.7–7.7)
Neutrophils Relative %: 55 %
Platelets: 246 10*3/uL (ref 150–400)
RBC: 2.66 MIL/uL — ABNORMAL LOW (ref 3.87–5.11)
RDW: 13.2 % (ref 11.5–15.5)
WBC: 7.1 10*3/uL (ref 4.0–10.5)
nRBC: 0 % (ref 0.0–0.2)

## 2021-10-20 LAB — PHOSPHORUS: Phosphorus: 2.9 mg/dL (ref 2.5–4.6)

## 2021-10-20 LAB — COMPREHENSIVE METABOLIC PANEL
ALT: 15 U/L (ref 0–44)
AST: 15 U/L (ref 15–41)
Albumin: 2.6 g/dL — ABNORMAL LOW (ref 3.5–5.0)
Alkaline Phosphatase: 39 U/L (ref 38–126)
Anion gap: 4 — ABNORMAL LOW (ref 5–15)
BUN: 26 mg/dL — ABNORMAL HIGH (ref 8–23)
CO2: 22 mmol/L (ref 22–32)
Calcium: 8.3 mg/dL — ABNORMAL LOW (ref 8.9–10.3)
Chloride: 112 mmol/L — ABNORMAL HIGH (ref 98–111)
Creatinine, Ser: 1.38 mg/dL — ABNORMAL HIGH (ref 0.44–1.00)
GFR, Estimated: 44 mL/min — ABNORMAL LOW (ref >60)
Glucose, Bld: 74 mg/dL (ref 70–99)
Potassium: 4.2 mmol/L (ref 3.5–5.1)
Sodium: 138 mmol/L (ref 135–145)
Total Bilirubin: 0.4 mg/dL (ref 0.3–1.2)
Total Protein: 5 g/dL — ABNORMAL LOW (ref 6.5–8.1)

## 2021-10-20 LAB — MAGNESIUM: Magnesium: 2.2 mg/dL (ref 1.7–2.4)

## 2021-10-20 MED ORDER — DIPHENOXYLATE-ATROPINE 2.5-0.025 MG PO TABS
1.0000 | ORAL_TABLET | Freq: Three times a day (TID) | ORAL | Status: DC
  Administered 2021-10-20 – 2021-10-21 (×4): 1 via ORAL
  Filled 2021-10-20 (×4): qty 1

## 2021-10-20 MED ORDER — MELATONIN 5 MG PO TABS
5.0000 mg | ORAL_TABLET | Freq: Once | ORAL | Status: AC
  Administered 2021-10-20: 23:00:00 5 mg via ORAL
  Filled 2021-10-20: qty 1

## 2021-10-20 NOTE — Progress Notes (Signed)
PROGRESS NOTE    MILLENA CALLINS  AJO:[id number] DOB: Mar 24, 1961 DOA: 10/17/2021 PCP: Denita Lung, MD   Brief Narrative:  The patient is a 60 year old Caucasian female with a past medical history significant for but not limited to anemia, anxiety and depression, bronchitis, history of Crohn's ileitis who underwent ileocecectomy and sigmoidectomy in April of this year with an ileostomy pouch, GERD, history of migraine headaches, history of heart murmur, hypertension, IBS, history of incontinence, history of MVP, RLS, history of jejunal intussusception, history of high output ileostomy who presented to the ED with issues with pouching, skin breakdown and hyper ileostomy.  She has been to the ED multiple visits for this and was admitted back in November for an AKI with high output and she was placed on Imodium and iron with improvement in her output.  She really presents today again with her ileostomy bag falling off and complaining of burning and inflammation around her ileostomy site.  She denies any fevers, chills, night sweats or decreased appetite and no flank pain.  Of note patient has not been monitoring her output at home and reports that her ostomy bag was filled with 3 forceful and at least 5 times a day will need to be emptied.  She continued to have leakage around her ostomy bag with increasing redness and excoriation.  In the ED she is given 2 L of lactated Ringer's and surgery was consulted.  She did have an AKI with a BUNs/creatinine 59/1.96 but LFTs were normal.  She also had a mildly elevated potassium and a metabolic acidosis.  She was admitted for AKI in the setting of high output ileostomy with inability to keep up with her output.  General surgery has been consulted and has made some changes by increasing her Imodium to 4 mg 4 times daily and adding iron twice daily.  They are recommending continue FiberCon twice daily the goal of less than 1 L out a day.  Given that she is improving  a little and not making as much progress General Surgery is planning on adding Lomotil between imodium and following output.   Assessment & Plan:   Principal Problem:   AKI (acute kidney injury) ([place]) Active Problems:   Essential hypertension   Anxiety associated with depression   Hyperkalemia   Hyponatremia   High output ileostomy ([facility])   Crohn's ileitis ([place])   AKI on suspected CKD stage IIIa in the setting of high output ileostomy Metabolic acidosis -Patient presents with a BUNs/creatinine of 59/1.96 and her BUNs/creatinine has now improved with IV fluid hydration.  Now her BUNs/creatinine is 35/1.52 -> 26/1.38 -Recently creatinine has been around 1.2-1.4 but has been normal less than 1 earlier this year prior to her surgical intervention -She was given 2-1/2 L of lactated Ringer's boluses in the ED yesterday and then placed on maintenance IV fluid at 125 mils per hour; she is given another 500 mL bolus yesterday -Patient's metabolic acidosis is improving -We will avoid further nephrotoxic medications, contrast dyes, hypotension renally dose medications -She does have a small metabolic acidosis with a CO2 of 21, anion gap of 4, chloride level 113 yesterday and today it is showing a CO2 of 22, anion gap of 4, chloride level 112 -We will continue monitor her output and continue with IV fluid hydration -Surgery is being consulted for her high output ileostomy and see below  High output ileostomy -Still having quite a bit of drainage and glucose to have less than 1 L  a day -General surgery consulted and recommending ferrous sulfate 325 mg p.o. twice daily with meals, loperamide 4 mg p.o. 4 times daily, as well as polycarbophil 6.5 mg p.o. twice daily and general surgery recommends making no changes today -Continue with IV fluid hydration with lactated Ringer's at 125 MLS per hour -[facility] has been consulted for colostomy care and they are recommending lidocaine 4% cream topically 4 times  daily as needed for pain control edema at ileostomy site and they are recommending applying to around the ileostomy site as needed as well as Benadryl cream 3 times daily as needed itching the painful erythema on ileostomy site -General surgery is titrating medications for her high output ileostomy given the goal is an output of less than 1 L a day -Since she continues to have significant output they are adding  Lomotil 1 tab po TID -WBC went from 11.2 and is now 7.1 -They have reached out to her primary team at [facility] to arrange outpatient follow-up -She will be referred to the ostomy clinic and case manager has been consulted for help with transportation for appointments  Essential Hypertension -Given her AKI we will hold her losartan-HCTZ 50-12.5 mg tablets -We will continue monitor blood pressures per protocol blood pressures have been on the softer side -She is given another 500 mL bolus yesterday but her blood pressure today is now 134/89  Hyperkalemia -In the setting of her renal dysfunction from her high output ileostomy -Continue IV fluid hydration as above -K+ is now 4.2 -Continue to monitor  Hyponatremia -Improved. Na+ Went from 133 -> 138 -> 136 -> 138 -C/w IVF hydration as above -Continue to Monitor and Trend and Repeat CMP in the AM   Normocytic anemia/anemia of chronic kidney disease -Patient's hemoglobin/hematocrit went from 11.0/34.5 -> 9.9/31.2 -> 8.7/28.0 -> 8.1/25.6 and likely dilutional drop -Anemia panel done and checked and showed an iron level 82, U IBC 241, TIBC 323, saturation ratio 25% of ferritin level 43, folate of 8.3, and vitamin B12 134 -Continue to monitor for signs and symptoms of bleeding; currently no overt bleeding noted -Repeat CBC in the a.m.  Depression and anxiety -Continue with venlafaxine XR 150 mg p.o. daily  DVT prophylaxis: Enoxaparin 40 mg sq q24 Code Status: FULL CODE Family Communication: No family present at bedside  Disposition  Plan: Pending further clinical improvement and improvement in her high output ileostomy; she needs to have less than a liter day prior to being safely discharged  Status is: Observation  The patient will require care spanning > 2 midnights and should be moved to inpatient because: She continues have significant amount of output from her ostomy  Consultants:  General Surgery   Procedures: None  Antimicrobials:  Anti-infectives (From admission, onward)    None       Subjective: Seen and examined at bedside and thinks that her diarrhea is still about the same.  Still did not get very much rest last night and trying to take a nap today.  No nausea or vomiting.  Thinks her abdominal pain is little bit better.  No other concerns or complaints at this time.  Objective: Vitals:   10/19/21 0455 10/19/21 1252 10/19/21 2028 10/20/21 0513  BP: 109/75 110/67 98/62 134/89  Pulse: 69 78 76 79  Resp: 18 18 16 18   Temp:  97.8 F (36.6 C) 98.8 F (37.1 C) 98.1 F (36.7 C)  TempSrc:  Oral Oral Oral  SpO2: 100% 100% 99% 100%  Weight:  Height:        Intake/Output Summary (Last 24 hours) at 10/20/2021 1300 Last data filed at 10/20/2021 1100 Gross per 24 hour  Intake 678 ml  Output 2250 ml  Net -1572 ml    Filed Weights   10/17/21 0455  Weight: 54.4 kg   Examination: Physical Exam:  Constitutional: Thin Caucasian female currently in no acute distress resting in the bed Eyes: Lids and conjunctivae normal, sclerae anicteric  ENMT: External Ears, Nose appear normal. Grossly normal hearing.  Neck: Appears normal, supple, no cervical masses, normal ROM, no appreciable thyromegaly; no appreciable JVD Respiratory: Diminished to auscultation bilaterally, no wheezing, rales, rhonchi or crackles. Normal respiratory effort and patient is not tachypenic. No accessory muscle use.  Unlabored breathing Cardiovascular: RRR, no murmurs / rubs / gallops. S1 and S2 auscultated. No extremity  edema.  Abdomen: Soft, mildly-tender, non-distended.  Has an ileostomy in place with quite a bit of liquid stool.  Bowel sounds positive.  GU: Deferred. Musculoskeletal: No clubbing / cyanosis of digits/nails. No joint deformity upper and lower extremities.  Skin: No rashes, lesions, ulcers on limited skin evaluation. No induration; Warm and dry.  Neurologic: CN 2-12 grossly intact with no focal deficits.  Romberg sign and cerebellar reflexes not assessed.  Psychiatric: Normal judgment and insight. Alert and oriented x 3. Normal mood and appropriate affect.   Data Reviewed: I have personally reviewed following labs and imaging studies  CBC: Recent Labs  Lab 10/17/21 0743 10/18/21 0554 10/19/21 0644 10/20/21 0537  WBC 11.2* 7.1 7.3 7.1  NEUTROABS 8.2*  --  4.3 3.9  HGB 11.0* 9.9* 8.7* 8.1*  HCT 34.5* 31.2* 28.0* 25.6*  MCV 96.1 96.0 97.6 96.2  PLT 350 309 260 326    Basic Metabolic Panel: Recent Labs  Lab 10/17/21 0743 10/18/21 0554 10/19/21 0644 10/20/21 0537  NA 133* 138 136 138  K 5.3* 3.8 4.6 4.2  CL 111 113* 113* 112*  CO2 15* 21* 20* 22  GLUCOSE 97 78 77 74  BUN 59* 39* 35* 26*  CREATININE 1.96* 1.63* 1.52* 1.38*  CALCIUM 9.1 8.5* 8.5* 8.3*  MG  --   --  1.7 2.2  PHOS  --   --  2.9 2.9    GFR: Estimated Creatinine Clearance: 36.8 mL/min (A) (by C-G formula based on SCr of 1.38 mg/dL (H)). Liver Function Tests: Recent Labs  Lab 10/17/21 0743 10/19/21 0644 10/20/21 0537  AST 30 18 15   ALT 17 16 15   ALKPHOS 62 45 39  BILITOT 1.0 0.6 0.4  PROT 7.7 5.4* 5.0*  ALBUMIN 4.1 2.8* 2.6*    No results for input(s): LIPASE, AMYLASE in the last 168 hours. No results for input(s): AMMONIA in the last 168 hours. Coagulation Profile: No results for input(s): INR, PROTIME in the last 168 hours. Cardiac Enzymes: No results for input(s): CKTOTAL, CKMB, CKMBINDEX, TROPONINI in the last 168 hours. BNP (last 3 results) No results for input(s): PROBNP in the last 8760  hours. HbA1C: No results for input(s): HGBA1C in the last 72 hours. CBG: Recent Labs  Lab 10/18/21 0010  GLUCAP 132*    Lipid Profile: No results for input(s): CHOL, HDL, LDLCALC, TRIG, CHOLHDL, LDLDIRECT in the last 72 hours. Thyroid Function Tests: No results for input(s): TSH, T4TOTAL, FREET4, T3FREE, THYROIDAB in the last 72 hours. Anemia Panel: Recent Labs    10/19/21 0644  VITAMINB12 134*  FOLATE 8.3  FERRITIN 43  TIBC 323  IRON 82  RETICCTPCT 1.1  Sepsis Labs: No results for input(s): PROCALCITON, LATICACIDVEN in the last 168 hours.  Recent Results (from the past 240 hour(s))  Resp Panel by RT-PCR (Flu A&B, Covid) Nasopharyngeal Swab     Status: None   Collection Time: 10/17/21  1:53 PM   Specimen: Nasopharyngeal Swab; Nasopharyngeal(NP) swabs in vial transport medium  Result Value Ref Range Status   SARS Coronavirus 2 by RT PCR NEGATIVE NEGATIVE Final    Comment: (NOTE) SARS-CoV-2 target nucleic acids are NOT DETECTED.  The SARS-CoV-2 RNA is generally detectable in upper respiratory specimens during the acute phase of infection. The lowest concentration of SARS-CoV-2 viral copies this assay can detect is 138 copies/mL. A negative result does not preclude SARS-Cov-2 infection and should not be used as the sole basis for treatment or other patient management decisions. A negative result may occur with  improper specimen collection/handling, submission of specimen other than nasopharyngeal swab, presence of viral mutation(s) within the areas targeted by this assay, and inadequate number of viral copies(<138 copies/mL). A negative result must be combined with clinical observations, patient history, and epidemiological information. The expected result is Negative.  Fact Sheet for Patients:  EntrepreneurPulse.com.au  Fact Sheet for Healthcare Providers:  IncredibleEmployment.be  This test is no t yet approved or cleared  by the [place] FDA and  has been authorized for detection and/or diagnosis of SARS-CoV-2 by FDA under an Emergency Use Authorization (EUA). This EUA will remain  in effect (meaning this test can be used) for the duration of the COVID-19 declaration under Section 564(b)(1) of the Act, 21 U.S.C.section 360bbb-3(b)(1), unless the authorization is terminated  or revoked sooner.       Influenza A by PCR NEGATIVE NEGATIVE Final   Influenza B by PCR NEGATIVE NEGATIVE Final    Comment: (NOTE) The Xpert Xpress SARS-CoV-2/FLU/RSV plus assay is intended as an aid in the diagnosis of influenza from Nasopharyngeal swab specimens and should not be used as a sole basis for treatment. Nasal washings and aspirates are unacceptable for Xpert Xpress SARS-CoV-2/FLU/RSV testing.  Fact Sheet for Patients: EntrepreneurPulse.com.au  Fact Sheet for Healthcare Providers: IncredibleEmployment.be  This test is not yet approved or cleared by the [place] FDA and has been authorized for detection and/or diagnosis of SARS-CoV-2 by FDA under an Emergency Use Authorization (EUA). This EUA will remain in effect (meaning this test can be used) for the duration of the COVID-19 declaration under Section 564(b)(1) of the Act, 21 U.S.C. section 360bbb-3(b)(1), unless the authorization is terminated or revoked.  Performed at [facility], [place] [address], [place], [place] [address]    RN Pressure Injury Documentation:     Estimated body mass index is 20.6 kg/m as calculated from the following:   Height as of this encounter: 5' 4"  (1.626 m).   Weight as of this encounter: 54.4 kg.  Malnutrition Type:   Malnutrition Characteristics:   Nutrition Interventions:   Radiology Studies: No results found.  Scheduled Meds:  diphenoxylate-atropine  1 tablet Oral TID   enoxaparin (LOVENOX) injection  40 mg Subcutaneous Q24H   ferrous sulfate  325 mg  Oral BID WC   loperamide  4 mg Oral QID   polycarbophil  625 mg Oral BID   venlafaxine XR  150 mg Oral Q breakfast   Continuous Infusions:  lactated ringers 125 mL/hr at 10/20/21 0500    LOS: 2 days   Kerney Elbe, DO [facility] PAGER is on [place]  If 7PM-7AM, please contact night-coverage  [email or web address]

## 2021-10-20 NOTE — Progress Notes (Signed)
Patient ID: Cynthia [name], female   DOB: 10-17-1961, 60 y.o.   MRN: [id number] Will add lomotil between imodium today, follow output

## 2021-10-21 LAB — CBC WITH DIFFERENTIAL/PLATELET
Abs Immature Granulocytes: 0.02 10*3/uL (ref 0.00–0.07)
Basophils Absolute: 0 10*3/uL (ref 0.0–0.1)
Basophils Relative: 0 %
Eosinophils Absolute: 0.4 10*3/uL (ref 0.0–0.5)
Eosinophils Relative: 5 %
HCT: 26.7 % — ABNORMAL LOW (ref 36.0–46.0)
Hemoglobin: 8.2 g/dL — ABNORMAL LOW (ref 12.0–15.0)
Immature Granulocytes: 0 %
Lymphocytes Relative: 34 %
Lymphs Abs: 2.3 10*3/uL (ref 0.7–4.0)
MCH: 30 pg (ref 26.0–34.0)
MCHC: 30.7 g/dL (ref 30.0–36.0)
MCV: 97.8 fL (ref 80.0–100.0)
Monocytes Absolute: 0.4 10*3/uL (ref 0.1–1.0)
Monocytes Relative: 6 %
Neutro Abs: 3.7 10*3/uL (ref 1.7–7.7)
Neutrophils Relative %: 55 %
Platelets: 240 10*3/uL (ref 150–400)
RBC: 2.73 MIL/uL — ABNORMAL LOW (ref 3.87–5.11)
RDW: 13.2 % (ref 11.5–15.5)
WBC: 6.7 10*3/uL (ref 4.0–10.5)
nRBC: 0 % (ref 0.0–0.2)

## 2021-10-21 LAB — COMPREHENSIVE METABOLIC PANEL
ALT: 12 U/L (ref 0–44)
AST: 13 U/L — ABNORMAL LOW (ref 15–41)
Albumin: 2.5 g/dL — ABNORMAL LOW (ref 3.5–5.0)
Alkaline Phosphatase: 40 U/L (ref 38–126)
Anion gap: 3 — ABNORMAL LOW (ref 5–15)
BUN: 26 mg/dL — ABNORMAL HIGH (ref 8–23)
CO2: 22 mmol/L (ref 22–32)
Calcium: 8.3 mg/dL — ABNORMAL LOW (ref 8.9–10.3)
Chloride: 113 mmol/L — ABNORMAL HIGH (ref 98–111)
Creatinine, Ser: 1.36 mg/dL — ABNORMAL HIGH (ref 0.44–1.00)
GFR, Estimated: 44 mL/min — ABNORMAL LOW (ref >60)
Glucose, Bld: 75 mg/dL (ref 70–99)
Potassium: 4.4 mmol/L (ref 3.5–5.1)
Sodium: 138 mmol/L (ref 135–145)
Total Bilirubin: 0.4 mg/dL (ref 0.3–1.2)
Total Protein: 5 g/dL — ABNORMAL LOW (ref 6.5–8.1)

## 2021-10-21 LAB — MAGNESIUM: Magnesium: 1.9 mg/dL (ref 1.7–2.4)

## 2021-10-21 LAB — PHOSPHORUS: Phosphorus: 3 mg/dL (ref 2.5–4.6)

## 2021-10-21 MED ORDER — MELATONIN 5 MG PO TABS
5.0000 mg | ORAL_TABLET | Freq: Once | ORAL | Status: AC
  Administered 2021-10-21: 22:00:00 5 mg via ORAL
  Filled 2021-10-21: qty 1

## 2021-10-21 MED ORDER — DIPHENOXYLATE-ATROPINE 2.5-0.025 MG PO TABS
2.0000 | ORAL_TABLET | Freq: Three times a day (TID) | ORAL | Status: DC
  Administered 2021-10-21 – 2021-10-22 (×3): 2 via ORAL
  Filled 2021-10-21 (×3): qty 2

## 2021-10-21 NOTE — Progress Notes (Signed)
PROGRESS NOTE    Cynthia [name]  NWG:[id number] DOB: 02/22/1961 DOA: 10/17/2021 PCP: Denita Lung, MD   Brief Narrative:  The patient is a 60 year old Caucasian female with a past medical history significant for but not limited to anemia, anxiety and depression, bronchitis, history of Crohn's ileitis who underwent ileocecectomy and sigmoidectomy in April of this year with an ileostomy pouch, GERD, history of migraine headaches, history of heart murmur, hypertension, IBS, history of incontinence, history of MVP, RLS, history of jejunal intussusception, history of high output ileostomy who presented to the ED with issues with pouching, skin breakdown and hyper ileostomy.  She has been to the ED multiple visits for this and was admitted back in November for an AKI with high output and she was placed on Imodium and iron with improvement in her output.  She really presents today again with her ileostomy bag falling off and complaining of burning and inflammation around her ileostomy site.  She denies any fevers, chills, night sweats or decreased appetite and no flank pain.  Of note patient has not been monitoring her output at home and reports that her ostomy bag was filled with 3 forceful and at least 5 times a day will need to be emptied.  She continued to have leakage around her ostomy bag with increasing redness and excoriation.  In the ED she is given 2 L of lactated Ringer's and surgery was consulted.  She did have an AKI with a BUNs/creatinine 59/1.96 but LFTs were normal.  She also had a mildly elevated potassium and a metabolic acidosis.  She was admitted for AKI in the setting of high output ileostomy with inability to keep up with her output.  General surgery has been consulted and has made some changes by increasing her Imodium to 4 mg 4 times daily and adding iron twice daily.  They are recommending continue FiberCon twice daily the goal of less than 1 L out a day.  Given that she is improving  a little and not making as much progress General Surgery is planning on adding Lomotil between imodium and following output but she continues to have significant amount of output so now they are recommending GI involvement and there would not be increasing Lomotil today and feel that her stool should decrease as her diet improves.  General surgery is planning on getting her a colorectal appointment as an outpatient to initially discuss ileostomy takedown  Assessment & Plan:   Principal Problem:   AKI (acute kidney injury) ([place]) Active Problems:   Essential hypertension   Anxiety associated with depression   Hyperkalemia   Hyponatremia   High output ileostomy ([place])   Crohn's ileitis ([place])   AKI on suspected CKD stage IIIa in the setting of high output ileostomy Metabolic acidosis -Patient presents with a BUNs/creatinine of 59/1.96 and her BUNs/creatinine has now improved with IV fluid hydration.  Now her BUNs/creatinine is 35/1.52 -> 26/1.38 and today it is 26/1.36 -Recently creatinine has been around 1.2-1.4 but has been normal less than 1 earlier this year prior to her surgical intervention -She was given 2-1/2 L of lactated Ringer's boluses in the ED yesterday and then placed on maintenance IV fluid at 125 mils per hour; she is given another 500 mL bolus yesterday -Patient's metabolic acidosis is improving -We will avoid further nephrotoxic medications, contrast dyes, hypotension renally dose medications -She does have a small metabolic acidosis with a CO2 of 21, anion gap of 4, chloride level 113 yesterday and  today it is showing a CO2 of 22, anion gap of 3, chloride level 113 -We will continue monitor her output and continue with IV fluid hydration -Surgery is being consulted for her high output ileostomy and see below  High output ileostomy -Still having quite a bit of drainage and glucose to have less than 1 L a day -General surgery consulted and recommending ferrous sulfate 325 mg  p.o. twice daily with meals, loperamide 4 mg p.o. 4 times daily, as well as polycarbophil 6.5 mg p.o. twice daily and general surgery recommends making no changes today -Continue with IV fluid hydration with lactated Ringer's at 125 MLS per hour -[facility] has been consulted for colostomy care and they are recommending lidocaine 4% cream topically 4 times daily as needed for pain control edema at ileostomy site and they are recommending applying to around the ileostomy site as needed as well as Benadryl cream 3 times daily as needed itching the painful erythema on ileostomy site -General surgery is titrating medications for her high output ileostomy given the goal is an output of less than 1 L a day -Since she continues to have significant output they are adding  Lomotil 1 tab po TID and increasing the Lomotil dose today to 2 tablets p.o. 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 Na+ Went from 133 -> 138 -> 136 -> 138 x2 -C/w IVF hydration as above -Continue to Monitor and Trend and Repeat CMP in the AM   Normocytic anemia/anemia of chronic kidney disease -Patient's hemoglobin/hematocrit went from 11.0/34.5 -> 9.9/31.2 -> 8.7/28.0 -> 8.1/25.6 and likely dilutional drop and is now stable at 8.2/26.7 today -Anemia panel done and checked and showed an iron level 82, U IBC 241, TIBC 323, saturation ratio 25% of ferritin level 43, folate of 8.3, and vitamin B12 134 -Continue to monitor for signs and symptoms of bleeding; currently no overt bleeding noted -Repeat CBC in the a.m.  Depression and anxiety -Continue with venlafaxine XR 150 mg p.o. daily  DVT prophylaxis: Enoxaparin 40 mg sq q24 Code Status: FULL CODE Family Communication: No family present at bedside  Disposition Plan: Pending further clinical improvement and improvement in her high output ileostomy; she needs to have less than a liter day prior to being safely discharged  Status is: Inpatient  The patient will require care spanning > 2 midnights and should be moved to inpatient  because: She continues have significant amount of output from her ostomy  Consultants:  General Surgery   Procedures: None  Antimicrobials:  Anti-infectives (From admission, onward)    None       Subjective: Seen and examined at bedside and still has quite a bit of output and it is slowly decreasing.  She is tolerating her diet and denies any nausea or vomiting.  States that she did not sleep very well again last night.  No other concerns or complaints at this time.  Objective: Vitals:   10/20/21 1429 10/20/21 2051 10/21/21 0546 10/21/21 1233  BP: 104/66 92/61 124/76 109/74  Pulse: 80 70 74 64  Resp: 17 16 20 20   Temp: 98.6 F (37 C) 98.8 F (37.1 C) 98.4 F (36.9 C) 98.3 F (36.8 C)  TempSrc: Oral Oral Oral Oral  SpO2: 100%  100% 99% 98%  Weight:      Height:        Intake/Output Summary (Last 24 hours) at 10/21/2021 1406 Last data filed at 10/21/2021 1400 Gross per 24 hour  Intake 840 ml  Output 3275 ml  Net -2435 ml    Filed Weights   10/17/21 0455  Weight: 54.4 kg   Examination: Physical Exam:  Constitutional: Thin Caucasian female currently no acute distress resting in bed but a little uncomfortable Eyes: Lids and conjunctivae normal, sclerae anicteric  ENMT: External Ears, Nose appear normal. Grossly normal hearing. Mucous membranes are moist.  Neck: Appears normal, supple, no cervical masses, normal ROM, no appreciable thyromegaly; no appreciable JVD Respiratory: Diminished to auscultation bilaterally, no wheezing, rales, rhonchi or crackles. Normal respiratory effort and patient is not tachypenic. No accessory muscle use.  Unlabored breathing Cardiovascular: RRR, no murmurs / rubs / gallops. S1 and S2 auscultated. No extremity edema. 2+ pedal pulses. No carotid bruits.  Abdomen: Soft, mildly-tender, non-distended.  Ileostomy in place with quite a bit of dark bilious liquid stool.  Bowel sounds positive and hyperactive and gurgling.  GU: Deferred. Musculoskeletal: No clubbing / cyanosis of digits/nails.  No appreciable joint fomites in upper and lower extremities Skin: No rashes, lesions, ulcers on limited skin evaluation. No induration; Warm and dry.  Neurologic: CN 2-12 grossly intact with no focal deficits. Romberg sign and cerebellar reflexes not assessed.  Psychiatric: Normal judgment and insight. Alert and oriented x 3. Normal mood and appropriate affect.   Data Reviewed: I have personally reviewed following labs and imaging studies  CBC: Recent Labs  Lab 10/17/21 0743 10/18/21 0554 10/19/21 0644 10/20/21 0537 10/21/21 0455  WBC 11.2* 7.1 7.3 7.1 6.7  NEUTROABS 8.2*  --  4.3 3.9 3.7  HGB 11.0* 9.9* 8.7* 8.1* 8.2*  HCT 34.5* 31.2* 28.0* 25.6* 26.7*  MCV 96.1 96.0 97.6 96.2 97.8   PLT 350 309 260 246 297    Basic Metabolic Panel: Recent Labs  Lab 10/17/21 0743 10/18/21 0554 10/19/21 0644 10/20/21 0537 10/21/21 0455  NA 133* 138 136 138 138  K 5.3* 3.8 4.6 4.2 4.4  CL 111 113* 113* 112* 113*  CO2 15* 21* 20* 22 22  GLUCOSE 97 78 77 74 75  BUN 59* 39* 35* 26* 26*  CREATININE 1.96* 1.63* 1.52* 1.38* 1.36*  CALCIUM 9.1 8.5* 8.5* 8.3* 8.3*  MG  --   --  1.7 2.2 1.9  PHOS  --   --  2.9 2.9 3.0    GFR: Estimated Creatinine Clearance: 37.3 mL/min (A) (by C-G formula based on SCr of 1.36 mg/dL (H)). Liver  Function Tests: Recent Labs  Lab 10/17/21 0743 10/19/21 0644 10/20/21 0537 10/21/21 0455  AST 30 18 15  13*  ALT 17 16 15 12   ALKPHOS 62 45 39 40  BILITOT 1.0 0.6 0.4 0.4  PROT 7.7 5.4* 5.0* 5.0*  ALBUMIN 4.1 2.8* 2.6* 2.5*    No results for input(s): LIPASE, AMYLASE in the last 168 hours. No results for input(s): AMMONIA in the last 168 hours. Coagulation Profile: No results for input(s): INR, PROTIME in the last 168 hours. Cardiac Enzymes: No results for input(s): CKTOTAL, CKMB, CKMBINDEX, TROPONINI in the last 168 hours. BNP (last 3 results) No results for input(s): PROBNP in the last 8760 hours. HbA1C: No results for input(s): HGBA1C in the last 72 hours. CBG: Recent Labs  Lab 10/18/21 0010  GLUCAP 132*    Lipid Profile: No results for input(s): CHOL, HDL, LDLCALC, TRIG, CHOLHDL, LDLDIRECT in the last 72 hours. Thyroid Function Tests: No results for input(s): TSH, T4TOTAL, FREET4, T3FREE, THYROIDAB in the last 72 hours. Anemia Panel: Recent Labs    10/19/21 0644  VITAMINB12 134*  FOLATE 8.3  FERRITIN 43  TIBC 323  IRON 82  RETICCTPCT 1.1    Sepsis Labs: No results for input(s): PROCALCITON, LATICACIDVEN in the last 168 hours.  Recent Results (from the past 240 hour(s))  Resp Panel by RT-PCR (Flu A&B, Covid) Nasopharyngeal Swab     Status: None   Collection Time: 10/17/21  1:53 PM   Specimen: Nasopharyngeal Swab;  Nasopharyngeal(NP) swabs in vial transport medium  Result Value Ref Range Status   SARS Coronavirus 2 by RT PCR NEGATIVE NEGATIVE Final    Comment: (NOTE) SARS-CoV-2 target nucleic acids are NOT DETECTED.  The SARS-CoV-2 RNA is generally detectable in upper respiratory specimens during the acute phase of infection. The lowest concentration of SARS-CoV-2 viral copies this assay can detect is 138 copies/mL. A negative result does not preclude SARS-Cov-2 infection and should not be used as the sole basis for treatment or other patient management decisions. A negative result may occur with  improper specimen collection/handling, submission of specimen other than nasopharyngeal swab, presence of viral mutation(s) within the areas targeted by this assay, and inadequate number of viral copies(<138 copies/mL). A negative result must be combined with clinical observations, patient history, and epidemiological information. The expected result is Negative.  Fact Sheet for Patients:  EntrepreneurPulse.com.au  Fact Sheet for Healthcare Providers:  IncredibleEmployment.be  This test is no t yet approved or cleared by the [place] FDA and  has been authorized for detection and/or diagnosis of SARS-CoV-2 by FDA under an Emergency Use Authorization (EUA). This EUA will remain  in effect (meaning this test can be used) for the duration of the COVID-19 declaration under Section 564(b)(1) of the Act, 21 U.S.C.section 360bbb-3(b)(1), unless the authorization is terminated  or revoked sooner.       Influenza A by PCR NEGATIVE NEGATIVE Final   Influenza B by PCR NEGATIVE NEGATIVE Final    Comment: (NOTE) The Xpert Xpress SARS-CoV-2/FLU/RSV plus assay is intended as an aid in the diagnosis of influenza from Nasopharyngeal swab specimens and should not be used as a sole basis for treatment. Nasal washings and aspirates are unacceptable for Xpert Xpress  SARS-CoV-2/FLU/RSV testing.  Fact Sheet for Patients: EntrepreneurPulse.com.au  Fact Sheet for Healthcare Providers: IncredibleEmployment.be  This test is not yet approved or cleared by the [place] FDA and has been authorized for detection and/or diagnosis of SARS-CoV-2 by FDA under an Emergency Use Authorization (  EUA). This EUA will remain in effect (meaning this test can be used) for the duration of the COVID-19 declaration under Section 564(b)(1) of the Act, 21 U.S.C. section 360bbb-3(b)(1), unless the authorization is terminated or revoked.  Performed at [facility], [place] [address] [place], [place] [address]    RN Pressure Injury Documentation:     Estimated body mass index is 20.6 kg/m as calculated from the following:   Height as of this encounter: 5' 4"  (1.626 m).   Weight as of this encounter: 54.4 kg.  Malnutrition Type:   Malnutrition Characteristics:   Nutrition Interventions:   Radiology Studies: No results found.  Scheduled Meds:  diphenoxylate-atropine  2 tablet Oral TID   enoxaparin (LOVENOX) injection  40 mg Subcutaneous Q24H   ferrous sulfate  325 mg Oral BID WC   loperamide  4 mg Oral QID   polycarbophil  625 mg Oral BID   venlafaxine XR  150 mg Oral Q breakfast   Continuous Infusions:  lactated ringers 125 mL/hr at 10/21/21 4193    LOS: 3 days   Kerney Elbe, DO [facility] PAGER is on AMION  If 7PM-7AM, please contact night-coverage [email or web address]

## 2021-10-21 NOTE — Progress Notes (Signed)
[facility] Progress Note     Subjective: CC-  Feeling better today. Tolerating diet. Denies n/v. Ostomy output slowly decreasing, 1975cc last 24 hours. Output is thin and bilious. Skin surrounding stoma improved.  Objective: Vital signs in last 24 hours: Temp:  [98.4 F (36.9 C)-98.8 F (37.1 C)] 98.4 F (36.9 C) (12/19 0546) Pulse Rate:  [70-80] 74 (12/19 0546) Resp:  [16-20] 20 (12/19 0546) BP: (92-124)/(61-76) 124/76 (12/19 0546) SpO2:  [99 %-100 %] 99 % (12/19 0546) Last BM Date: 10/21/21  Intake/Output from previous day: 12/18 0701 - 12/19 0700 In: 600 [P.O.:600] Out: 2275 [Urine:300; Stool:1975] Intake/Output this shift: Total I/O In: 480 [P.O.:480] Out: 250 [Stool:250]  PE: Gen:  Alert, NAD, pleasant Pulm: rate and effort normal Abd: Soft, NT/ND, ostomy viable with thin bilious fluid in bag, skin erythema/ excoriation along the right side of her abdomen extending to the right flank significantly improved  Lab Results:  Recent Labs    10/20/21 0537 10/21/21 0455  WBC 7.1 6.7  HGB 8.1* 8.2*  HCT 25.6* 26.7*  PLT 246 240   BMET Recent Labs    10/20/21 0537 10/21/21 0455  NA 138 138  K 4.2 4.4  CL 112* 113*  CO2 22 22  GLUCOSE 74 75  BUN 26* 26*  CREATININE 1.38* 1.36*  CALCIUM 8.3* 8.3*   PT/INR No results for input(s): LABPROT, INR in the last 72 hours. CMP     Component Value Date/Time   NA 138 10/21/2021 0455   NA 136 07/19/2021 1324   K 4.4 10/21/2021 0455   CL 113 (H) 10/21/2021 0455   CO2 22 10/21/2021 0455   GLUCOSE 75 10/21/2021 0455   BUN 26 (H) 10/21/2021 0455   BUN 25 07/19/2021 1324   CREATININE 1.36 (H) 10/21/2021 0455   CREATININE 0.94 05/08/2016 0741   CALCIUM 8.3 (L) 10/21/2021 0455   PROT 5.0 (L) 10/21/2021 0455   PROT 5.5 (L) 07/19/2021 1324   ALBUMIN 2.5 (L) 10/21/2021 0455   ALBUMIN 3.4 (L) 07/19/2021 1324   AST 13 (L) 10/21/2021 0455   ALT 12 10/21/2021 0455   ALKPHOS 40 10/21/2021 0455   BILITOT  0.4 10/21/2021 0455   BILITOT <0.2 07/19/2021 1324   GFRNONAA 44 (L) 10/21/2021 0455   GFRAA 77 12/18/2020 1548   Lipase     Component Value Date/Time   LIPASE 73 (H) 09/14/2021 0525       Studies/Results: No results found.  Anti-infectives: Anti-infectives (From admission, onward)    None        Assessment/Plan High output Ileostomy  Pouching Issues with Skin irritation/breakdown Hx of Crohn's s/p ileocecectomy and sigmoidectomy in 02/2021 by Dr. Hilliard Clark at [facility] following for assistance with pouching and skin barriers to assist with skin irritation/breakdown from leaking of ileostomy pouching system >> skin breakdown much improved - per notes EDP reached out to the patient's surgeon, Dr. Hilliard Clark, of [facility]. Dr. Ardath Sax team will reach out to the patient to arrange outpatient follow up. Patient reports issues with transportation which SW is helping with - Patient is not currently followed by any GI practice for hx of Crohn's. She previously was followed by [facility] but due to multiple no shows she was discharged from the practice. Recommend that she follow up with [facility] or GI practice at [facility] if her surgeon prefer's this. - Will refer to ostomy clinic again - Continue imodium to 27m QID, iron BID, fibercon BID. Increase lomotil to 2 tabs  TID. Goal output <1L.  Was previously on Questran during prior admission but has not been taking this at home. avoid sugary drinks  ID - none FEN - IVF, CM diet VTE - lovenox Foley - none   LOS: 3 days    Wellington [place], [facility] 10/21/2021, 9:49 AM Please see Amion for pager number during day hours 7:00am-4:30pm

## 2021-10-21 NOTE — Consult Note (Signed)
[place] Nurse ostomy follow up Stoma type/location: RLQ, ileostomy  Stomal assessment/size: 1 1/2" budded, pink, moist  Peristomal assessment: NA Treatment options for stomal/peristomal skin: skin barrier ring Output liquid green, high output Ostomy pouching: 1pc. Convex pouch with 2" skin barrier ring and ostomy belt This clearly works for this patient, each time she is admitted we get her peristomal skin back in shape and she dc to home where she reports to me today she has no supplies.  She is waiting on a Education officer, museum (unclear who this is) to help her get a supplier.  I have provided her with the contact number for [facility] [employer] who is the only medical supplier that takes [place] [facility].  She will however must contact them and provide the information to them for a shipment.  I have marked a catalog and written out the items she needs for home use. We have discussed that 2-3 times per week changes would suffice if she doe not let the pouch overfill and irritate her skin or go without a pouch and allow output to continually run over her skin.  It is not clear to me that she has cognitive ability to care for the ostomy at home, but unfortunately she has no support in the home to assist her (remind her to empty, change).  Education provided:  I have stressed importance of emptying the pouch frequently, I know that she also reports increasing output and the surgery team is trying to address this adding/increasing lomotil today. Stressed importance of hydration as well. We will change her pouch tomorrow am to adequately determine that normal wear time with the CORRECT pouching system is obtainable.   Enrolled patient in [place] Start Discharge program: Yes, previously  [place] Nurse will follow along with you for continued support with ostomy teaching and care [place] MSN, [place], [place], [place], [place]

## 2021-10-22 LAB — COMPREHENSIVE METABOLIC PANEL
ALT: 13 U/L (ref 0–44)
AST: 16 U/L (ref 15–41)
Albumin: 2.6 g/dL — ABNORMAL LOW (ref 3.5–5.0)
Alkaline Phosphatase: 42 U/L (ref 38–126)
Anion gap: 6 (ref 5–15)
BUN: 21 mg/dL (ref 8–23)
CO2: 22 mmol/L (ref 22–32)
Calcium: 8.5 mg/dL — ABNORMAL LOW (ref 8.9–10.3)
Chloride: 111 mmol/L (ref 98–111)
Creatinine, Ser: 1.37 mg/dL — ABNORMAL HIGH (ref 0.44–1.00)
GFR, Estimated: 44 mL/min — ABNORMAL LOW (ref >60)
Glucose, Bld: 71 mg/dL (ref 70–99)
Potassium: 4.3 mmol/L (ref 3.5–5.1)
Sodium: 139 mmol/L (ref 135–145)
Total Bilirubin: 0.5 mg/dL (ref 0.3–1.2)
Total Protein: 5.2 g/dL — ABNORMAL LOW (ref 6.5–8.1)

## 2021-10-22 LAB — PHOSPHORUS: Phosphorus: 3.7 mg/dL (ref 2.5–4.6)

## 2021-10-22 LAB — CBC WITH DIFFERENTIAL/PLATELET
Abs Immature Granulocytes: 0.02 10*3/uL (ref 0.00–0.07)
Basophils Absolute: 0 10*3/uL (ref 0.0–0.1)
Basophils Relative: 0 %
Eosinophils Absolute: 0.4 10*3/uL (ref 0.0–0.5)
Eosinophils Relative: 5 %
HCT: 26.8 % — ABNORMAL LOW (ref 36.0–46.0)
Hemoglobin: 8.4 g/dL — ABNORMAL LOW (ref 12.0–15.0)
Immature Granulocytes: 0 %
Lymphocytes Relative: 33 %
Lymphs Abs: 2.2 10*3/uL (ref 0.7–4.0)
MCH: 30.5 pg (ref 26.0–34.0)
MCHC: 31.3 g/dL (ref 30.0–36.0)
MCV: 97.5 fL (ref 80.0–100.0)
Monocytes Absolute: 0.4 10*3/uL (ref 0.1–1.0)
Monocytes Relative: 6 %
Neutro Abs: 3.8 10*3/uL (ref 1.7–7.7)
Neutrophils Relative %: 56 %
Platelets: 257 10*3/uL (ref 150–400)
RBC: 2.75 MIL/uL — ABNORMAL LOW (ref 3.87–5.11)
RDW: 13 % (ref 11.5–15.5)
WBC: 6.9 10*3/uL (ref 4.0–10.5)
nRBC: 0 % (ref 0.0–0.2)

## 2021-10-22 LAB — MAGNESIUM: Magnesium: 1.6 mg/dL — ABNORMAL LOW (ref 1.7–2.4)

## 2021-10-22 MED ORDER — DIPHENOXYLATE-ATROPINE 2.5-0.025 MG PO TABS
2.0000 | ORAL_TABLET | Freq: Four times a day (QID) | ORAL | Status: DC
Start: 2021-10-22 — End: 2021-10-27
  Administered 2021-10-22 – 2021-10-27 (×21): 2 via ORAL
  Filled 2021-10-22 (×21): qty 2

## 2021-10-22 MED ORDER — CHOLESTYRAMINE 4 G PO PACK
4.0000 g | PACK | Freq: Two times a day (BID) | ORAL | Status: DC
  Filled 2021-10-22: qty 1

## 2021-10-22 MED ORDER — MAGNESIUM SULFATE 2 GM/50ML IV SOLN
2.0000 g | Freq: Once | INTRAVENOUS | Status: AC
  Administered 2021-10-22: 08:00:00 2 g via INTRAVENOUS
  Filled 2021-10-22: qty 50

## 2021-10-22 MED ORDER — CHOLESTYRAMINE LIGHT 4 G PO PACK
4.0000 g | PACK | Freq: Three times a day (TID) | ORAL | Status: DC
  Administered 2021-10-22 – 2021-10-27 (×14): 4 g via ORAL
  Filled 2021-10-22 (×21): qty 1

## 2021-10-22 MED ORDER — MAGNESIUM SULFATE 2 GM/50ML IV SOLN
2.0000 g | Freq: Once | INTRAVENOUS | Status: DC
Start: 2021-10-22 — End: 2021-10-22

## 2021-10-22 NOTE — Consult Note (Signed)
[place] Nurse ostomy follow up Stoma type/location: RLQ, ileostomy  Stomal assessment/size: 1 1/2" budded, pink, moist  Peristomal assessment:intact, no denudation today Treatment options for stomal/peristomal skin: skin barrier ring Output liquid green, high output Ostomy pouching: 1pc. Convex pouch with 2" skin barrier ring and ostomy belt; CONSIDER USE OF HIGH OUTPUT CONVEX POUCH, WE DO NOT STOCK THIS INPATIENT.  SAMPLES FOR USE POSSIBLY THIS WEEK. WOULD ALLOW PATIENT TO HOOK TO BEDSIDE DRAINAGE BAG AT HOME.  Convexity and a belt clearly works for this patient, each time she is admitted we get her peristomal skin back in shape and she dc to home where she reports to me today she has no supplies.   Education provided:  I have stressed importance of emptying the pouch frequently, I know that she also reports increasing output and the surgery team is trying to address this adding/increasing lomotil. Discussed use of high output pouch and bedside drainage bag. Not ideal for discretion when out and about but with her output it would allow this to be more manageable at home.  I have made my teammate that will be on this campus the remainder of this week the potential for use.  I have contacted [place] SS program to also send samples of this to the patient's home, keeping in mind this will only be 2-3 samples for use.    I have provided the [facility] number for patient, she must contact them for supplies as they are the only ones that take [place] MCD.  CM may want to consider promoting or assisting with this prior to DC. She reports she has no supplies at home.   Enrolled patient in [place] Start Discharge program: Yes, previously but I will request new convex pouches with spout to hook to beside drainage bag.   [place] Nurse will follow along with you for continued support with ostomy teaching and care [place] MSN, RN, [place], [place], [place]

## 2021-10-22 NOTE — Progress Notes (Signed)
[facility] Progress Note     Subjective: CC-  Feels about the same. Denies abdominal pain, n/v. Ileostomy output up again 2109cc. Output thicker today.  Objective: Vital signs in last 24 hours: Temp:  [97.9 F (36.6 C)-98.3 F (36.8 C)] 97.9 F (36.6 C) (12/20 0450) Pulse Rate:  [57-64] 57 (12/20 0450) Resp:  [14-20] 16 (12/20 0450) BP: (104-128)/(70-77) 128/77 (12/20 0450) SpO2:  [98 %-100 %] 100 % (12/20 0450) Last BM Date: 10/21/21  Intake/Output from previous day: 12/19 0701 - 12/20 0700 In: 3108 [P.O.:1708; I.V.:1400] Out: 3109 [Urine:1000; OEHOZ:2248] Intake/Output this shift: No intake/output data recorded.  PE: Gen:  Alert, NAD, pleasant Pulm: rate and effort normal Abd: Soft, NT/ND, ostomy viable with thin brown stool in bag, skin erythema/ excoriation along the right side of her abdomen extending to the right flank significantly improved  Lab Results:  Recent Labs    10/21/21 0455 10/22/21 0514  WBC 6.7 6.9  HGB 8.2* 8.4*  HCT 26.7* 26.8*  PLT 240 257   BMET Recent Labs    10/21/21 0455 10/22/21 0514  NA 138 139  K 4.4 4.3  CL 113* 111  CO2 22 22  GLUCOSE 75 71  BUN 26* 21  CREATININE 1.36* 1.37*  CALCIUM 8.3* 8.5*   PT/INR No results for input(s): LABPROT, INR in the last 72 hours. CMP     Component Value Date/Time   NA 139 10/22/2021 0514   NA 136 07/19/2021 1324   K 4.3 10/22/2021 0514   CL 111 10/22/2021 0514   CO2 22 10/22/2021 0514   GLUCOSE 71 10/22/2021 0514   BUN 21 10/22/2021 0514   BUN 25 07/19/2021 1324   CREATININE 1.37 (H) 10/22/2021 0514   CREATININE 0.94 05/08/2016 0741   CALCIUM 8.5 (L) 10/22/2021 0514   PROT 5.2 (L) 10/22/2021 0514   PROT 5.5 (L) 07/19/2021 1324   ALBUMIN 2.6 (L) 10/22/2021 0514   ALBUMIN 3.4 (L) 07/19/2021 1324   AST 16 10/22/2021 0514   ALT 13 10/22/2021 0514   ALKPHOS 42 10/22/2021 0514   BILITOT 0.5 10/22/2021 0514   BILITOT <0.2 07/19/2021 1324   GFRNONAA 44 (L) 10/22/2021  0514   GFRAA 77 12/18/2020 1548   Lipase     Component Value Date/Time   LIPASE 73 (H) 09/14/2021 0525       Studies/Results: No results found.  Anti-infectives: Anti-infectives (From admission, onward)    None        Assessment/Plan High output Ileostomy  Pouching Issues with Skin irritation/breakdown Hx of Crohn's s/p ileocecectomy and sigmoidectomy in 02/2021 by Dr. Hilliard Clark at [facility] following for assistance with pouching and skin barriers to assist with skin irritation/breakdown from leaking of ileostomy pouching system >> skin breakdown much improved. They also gave her the info for [facility] who can help with ostomy supplies at home - Patient would prefer to transfer surgical follow up to [facility] because transportation to [place] is difficult. She had her first surgery with Dr. Hilliard Clark due to her insurance but now she has a different insurance. I will reach out to our colorectal team  - TOC consult to see if home health PICC/IVF vs outpatient clinic for IVF would be an option - Continue imodium 66m QID, iron BID, fibercon BID, lomotil 2 tabs TID. Add cholestyramine 4g BID. Goal output <1L.    ID - none FEN - IVF, CM diet VTE - lovenox Foley - none Follow up - ostomy clinic, GI ([facility]  or [facility]? ), surgery   LOS: 4 days    Wellington Hampshire, [facility] 10/22/2021, 8:55 AM Please see Amion for pager number during day hours 7:00am-4:30pm

## 2021-10-22 NOTE — Progress Notes (Signed)
PROGRESS NOTE    Cynthia [name]  MHD:[id number] DOB: [birth year] DOA: 10/17/2021 PCP: Denita Lung, MD   Brief Narrative:  The patient is a 60 year old Caucasian female with a past medical history significant for but not limited to anemia, anxiety and depression, bronchitis, history of Crohn's ileitis who underwent ileocecectomy and sigmoidectomy in April of this year with an ileostomy pouch, GERD, history of migraine headaches, history of heart murmur, hypertension, IBS, history of incontinence, history of MVP, RLS, history of jejunal intussusception, history of high output ileostomy who presented to the ED with issues with pouching, skin breakdown and hyper ileostomy.  She has been to the ED multiple visits for this and was admitted back in November for an AKI with high output and she was placed on Imodium and iron with improvement in her output.  She really presents today again with her ileostomy bag falling off and complaining of burning and inflammation around her ileostomy site.  She denies any fevers, chills, night sweats or decreased appetite and no flank pain.  Of note patient has not been monitoring her output at home and reports that her ostomy bag was filled with 3 forceful and at least 5 times a day will need to be emptied.  She continued to have leakage around her ostomy bag with increasing redness and excoriation.  In the ED she is given 2 L of lactated Ringer's and surgery was consulted.  She did have an AKI with a BUNs/creatinine 59/1.96 but LFTs were normal.  She also had a mildly elevated potassium and a metabolic acidosis.  She was admitted for AKI in the setting of high output ileostomy with inability to keep up with her output.  General surgery has been consulted and has made some changes by increasing her Imodium to 4 mg 4 times daily and adding iron twice daily.  They are recommending continue FiberCon twice daily the goal of less than 1 L out a day.  Given that she is improving  a little and not making as much progress General Surgery is planning on adding Lomotil between imodium and following output but she continues to have significant amount of output so now they are recommending GI involvement and there would not be increasing Lomotil today and feel that her stool should decrease as her diet improves.  General surgery is planning on getting her a colorectal appointment as an outpatient to initially discuss ileostomy takedown  Because the patient continues to have significant high output from her ileostomy General surgery is going to start cholestyramine 4 g p.o. TID daily  Assessment & Plan:   Principal Problem:   AKI (acute kidney injury) ([place]) Active Problems:   Essential hypertension   Anxiety associated with depression   Hyperkalemia   Hyponatremia   High output ileostomy (HCC)   Crohn's ileitis ([place])   AKI on suspected CKD stage IIIa in the setting of high output ileostomy Metabolic acidosis -Patient presents with a BUNs/creatinine of 59/1.96 and her BUNs/creatinine has now improved with IV fluid hydration.  Now her BUNs/creatinine is 35/1.52 -> 26/1.38 -> 26/1.36 -> 21/1.37 -Recently creatinine has been around 1.2-1.4 but has been normal less than 1 earlier this year prior to her surgical intervention -She was given 2-1/2 L of lactated Ringer's boluses in the ED yesterday and then placed on maintenance IV fluid at 125 mils per hour; she is given another 500 mL bolus yesterday -Patient's metabolic acidosis is improving -We will avoid further nephrotoxic medications, contrast dyes, hypotension  renally dose medications -She does have a small metabolic acidosis with a CO2 of 21, anion gap of 4, chloride level 113 yesterday and today it is showing a CO2 of 22, anion gap of 6, chloride level 111 -We will continue monitor her output and continue with IV fluid hydration -Surgery is being consulted for her high output ileostomy and see below  High output  ileostomy -Still having quite a bit of drainage and glucose to have less than 1 L a day -General surgery consulted and recommending ferrous sulfate 325 mg p.o. twice daily with meals, loperamide 4 mg p.o. 4 times daily, as well as polycarbophil 6.5 mg p.o. twice daily and general surgery recommends making no changes today -Continue with IV fluid hydration with lactated Ringer's at 125 MLS per hour -[facility] has been consulted for colostomy care and they are recommending lidocaine 4% cream topically 4 times daily as needed for pain control edema at ileostomy site and they are recommending applying to around the ileostomy site as needed as well as Benadryl cream 3 times daily as needed itching the painful erythema on ileostomy site -General surgery is titrating medications for her high output ileostomy given the goal is an output of less than 1 L a day -Since she continues to have significant output so Surgery added Lomotil 1 tab po TID and increasing the Lomotil dose today to 2 tablets p.o. 3 times daily with stool goal of less than output of 1 L a day -Surgery is now adding cholestyramine 4 g p.o. 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 and that she would need to anastomosis to be connected -General surgery unclear if this issue can be get better in the short-term so they agreed to be looking into outpatient IV IV fluid options for the  patient  Essential Hypertension -Given her AKI we will hold her losartan-HCTZ 50-12.5 mg tablets -We will continue monitor blood pressures per protocol blood pressures have been on the softer side -She is given another 500 mL bolus yesterday but her blood pressure today is now 120/76  Hyperkalemia -In the setting of her renal dysfunction from her high output ileostomy -Continue IV fluid hydration as above -K+ is now 4.4 -Continue to monitor  Hyponatremia -Improved. Na+ Went from 133 -> 138 -> 136 -> 138 x2 -> 139 -C/w IVF hydration as above -Continue to Monitor and Trend and Repeat CMP in the AM   Normocytic anemia/anemia of chronic kidney disease -Patient's hemoglobin/hematocrit went from 11.0/34.5 -> 9.9/31.2 -> 8.7/28.0 -> 8.1/25.6 -> 8.2/26.7 and likely dilutional drop and is now stable at 8.4/26.8 today -Anemia panel done and checked and showed an iron level 82, U IBC 241, TIBC 323, saturation ratio 25% of ferritin level 43, folate of 8.3, and vitamin B12 134 -Continue to monitor for signs and symptoms of bleeding; currently no overt bleeding noted -Repeat CBC in the a.m.  Depression and anxiety -Continue with venlafaxine XR 150 mg p.o.  daily  DVT prophylaxis: Enoxaparin 40 mg sq q24 Code Status: FULL CODE Family Communication: No family present at bedside  Disposition Plan: Pending further clinical improvement and improvement in her high output ileostomy; she needs to have less than a liter day prior to being safely discharged  Status is: Inpatient  The patient will require care spanning > 2 midnights and should be moved to inpatient because: She continues have significant amount of output from her ostomy  Consultants:  General Surgery   Procedures: None  Antimicrobials:  Anti-infectives (From admission, onward)    None       Subjective: Seen and examined at bedside and still having quite a bit of output from her ostomy bag.  States that she has not been  sleeping well at all.  Denies any chest pain or shortness breath.  Feels okay.  No other concerns or complaints at this time.  Objective: Vitals:   10/21/21 1233 10/21/21 1930 10/22/21 0450 10/22/21 1220  BP: 109/74 104/70 128/77 120/76  Pulse: 64 62 (!) 57 (!) 57  Resp: 20 14 16 19   Temp: 98.3 F (36.8 C) 97.9 F (36.6 C) 97.9 F (36.6 C) 98.3 F (36.8 C)  TempSrc: Oral Oral Oral Oral  SpO2: 98% 100% 100% 100%  Weight:      Height:        Intake/Output Summary (Last 24 hours) at 10/22/2021 1311 Last data filed at 10/22/2021 1144 Gross per 24 hour  Intake 2328 ml  Output 2709 ml  Net -381 ml    Filed Weights   10/17/21 0455  Weight: 54.4 kg   Examination: Physical Exam:  Constitutional: Thin Caucasian female currently in no acute distress appears calm resting in the bed Eyes: Lids and conjunctivae normal, sclerae anicteric  ENMT: External Ears, Nose appear normal. Grossly normal hearing. Mucous membranes are moist.  Neck: Appears normal, supple, no cervical masses, normal ROM, no appreciable thyromegaly; no appreciable JVD Respiratory: Diminished to auscultation bilaterally, no wheezing, rales, rhonchi or crackles. Normal respiratory effort and patient is not tachypenic. No accessory muscle use.  Unlabored breathing Cardiovascular: RRR, no murmurs / rubs / gallops. S1 and S2 auscultated. No extremity edema  Abdomen: Soft, non-tender, non-distended.  Ileostomy in place with quite a bit of liquid stool in it.  Bowel sounds positive and hyperactive.  GU: Deferred. Musculoskeletal: No clubbing / cyanosis of digits/nails. Normal strength and muscle tone.  Skin: No rashes, lesions, ulcers. No induration; Warm and dry.  Neurologic: CN 2-12 grossly intact with no focal deficits.  Romberg sign and cerebellar reflexes not assessed Psychiatric: Normal judgment and insight. Alert and oriented x 3. Normal mood and appropriate affect.   Data Reviewed: I have personally reviewed  following labs and imaging studies  CBC: Recent Labs  Lab 10/17/21 0743 10/18/21 0554 10/19/21 0644 10/20/21 0537 10/21/21 0455 10/22/21 0514  WBC 11.2* 7.1 7.3 7.1 6.7 6.9  NEUTROABS 8.2*  --  4.3 3.9 3.7 3.8  HGB 11.0* 9.9* 8.7* 8.1* 8.2* 8.4*  HCT 34.5* 31.2* 28.0* 25.6* 26.7* 26.8*  MCV 96.1 96.0 97.6 96.2 97.8 97.5  PLT 350 309 260 246 240 607    Basic Metabolic Panel: Recent Labs  Lab 10/18/21 0554 10/19/21 0644 10/20/21 0537 10/21/21 0455 10/22/21 0514  NA 138 136 138 138 139  K 3.8 4.6 4.2 4.4 4.3  CL 113* 113* 112* 113* 111  CO2 21* 20* 22 22 22   GLUCOSE 78 77 74 75 71  BUN 39* 35* 26*  26* 21  CREATININE 1.63* 1.52* 1.38* 1.36* 1.37*  CALCIUM 8.5* 8.5* 8.3* 8.3* 8.5*  MG  --  1.7 2.2 1.9 1.6*  PHOS  --  2.9 2.9 3.0 3.7    GFR: Estimated Creatinine Clearance: 37 mL/min (A) (by C-G formula based on SCr of 1.37 mg/dL (H)). Liver Function Tests: Recent Labs  Lab 10/17/21 0743 10/19/21 0644 10/20/21 0537 10/21/21 0455 10/22/21 0514  AST 30 18 15  13* 16  ALT 17 16 15 12 13   ALKPHOS 62 45 39 40 42  BILITOT 1.0 0.6 0.4 0.4 0.5  PROT 7.7 5.4* 5.0* 5.0* 5.2*  ALBUMIN 4.1 2.8* 2.6* 2.5* 2.6*    No results for input(s): LIPASE, AMYLASE in the last 168 hours. No results for input(s): AMMONIA in the last 168 hours. Coagulation Profile: No results for input(s): INR, PROTIME in the last 168 hours. Cardiac Enzymes: No results for input(s): CKTOTAL, CKMB, CKMBINDEX, TROPONINI in the last 168 hours. BNP (last 3 results) No results for input(s): PROBNP in the last 8760 hours. HbA1C: No results for input(s): HGBA1C in the last 72 hours. CBG: Recent Labs  Lab 10/18/21 0010  GLUCAP 132*    Lipid Profile: No results for input(s): CHOL, HDL, LDLCALC, TRIG, CHOLHDL, LDLDIRECT in the last 72 hours. Thyroid Function Tests: No results for input(s): TSH, T4TOTAL, FREET4, T3FREE, THYROIDAB in the last 72 hours. Anemia Panel: No results for input(s):  VITAMINB12, FOLATE, FERRITIN, TIBC, IRON, RETICCTPCT in the last 72 hours.  Sepsis Labs: No results for input(s): PROCALCITON, LATICACIDVEN in the last 168 hours.  Recent Results (from the past 240 hour(s))  Resp Panel by RT-PCR (Flu A&B, Covid) Nasopharyngeal Swab     Status: None   Collection Time: 10/17/21  1:53 PM   Specimen: Nasopharyngeal Swab; Nasopharyngeal(NP) swabs in vial transport medium  Result Value Ref Range Status   SARS Coronavirus 2 by RT PCR NEGATIVE NEGATIVE Final    Comment: (NOTE) SARS-CoV-2 target nucleic acids are NOT DETECTED.  The SARS-CoV-2 RNA is generally detectable in upper respiratory specimens during the acute phase of infection. The lowest concentration of SARS-CoV-2 viral copies this assay can detect is 138 copies/mL. A negative result does not preclude SARS-Cov-2 infection and should not be used as the sole basis for treatment or other patient management decisions. A negative result may occur with  improper specimen collection/handling, submission of specimen other than nasopharyngeal swab, presence of viral mutation(s) within the areas targeted by this assay, and inadequate number of viral copies(<138 copies/mL). A negative result must be combined with clinical observations, patient history, and epidemiological information. The expected result is Negative.  Fact Sheet for Patients:  EntrepreneurPulse.com.au  Fact Sheet for Healthcare Providers:  IncredibleEmployment.be  This test is no t yet approved or cleared by the [place] FDA and  has been authorized for detection and/or diagnosis of SARS-CoV-2 by FDA under an Emergency Use Authorization (EUA). This EUA will remain  in effect (meaning this test can be used) for the duration of the COVID-19 declaration under Section 564(b)(1) of the Act, 21 U.S.C.section 360bbb-3(b)(1), unless the authorization is terminated  or revoked sooner.       Influenza A  by PCR NEGATIVE NEGATIVE Final   Influenza B by PCR NEGATIVE NEGATIVE Final    Comment: (NOTE) The Xpert Xpress SARS-CoV-2/FLU/RSV plus assay is intended as an aid in the diagnosis of influenza from Nasopharyngeal swab specimens and should not be used as a sole basis for treatment. Nasal washings and aspirates  are unacceptable for Xpert Xpress SARS-CoV-2/FLU/RSV testing.  Fact Sheet for Patients: EntrepreneurPulse.com.au  Fact Sheet for Healthcare Providers: IncredibleEmployment.be  This test is not yet approved or cleared by the [place] FDA and has been authorized for detection and/or diagnosis of SARS-CoV-2 by FDA under an Emergency Use Authorization (EUA). This EUA will remain in effect (meaning this test can be used) for the duration of the COVID-19 declaration under Section 564(b)(1) of the Act, 21 U.S.C. section 360bbb-3(b)(1), unless the authorization is terminated or revoked.  Performed at [facility], [place] [address], [place], [place] [address]    RN Pressure Injury Documentation:     Estimated body mass index is 20.6 kg/m as calculated from the following:   Height as of this encounter: 5' 4"  (1.626 m).   Weight as of this encounter: 54.4 kg.  Malnutrition Type:   Malnutrition Characteristics:   Nutrition Interventions:   Radiology Studies: No results found.  Scheduled Meds:  cholestyramine  4 g Oral TID   diphenoxylate-atropine  2 tablet Oral QID   enoxaparin (LOVENOX) injection  40 mg Subcutaneous Q24H   ferrous sulfate  325 mg Oral BID WC   loperamide  4 mg Oral QID   polycarbophil  625 mg Oral BID   venlafaxine XR  150 mg Oral Q breakfast   Continuous Infusions:  lactated ringers 125 mL/hr at 10/22/21 0552    LOS: 4 days   Kerney Elbe, DO [facility] PAGER is on AMION  If 7PM-7AM, please contact night-coverage [email or web address]

## 2021-10-22 NOTE — TOC Progression Note (Signed)
Transition of Care [facility]) - Progression Note    Patient Details  Name: Cynthia [name] MRN: [id number] Date of Birth: 1960/11/06  Transition of Care [facility]) CM/SW [place], [place] Phone Number: 10/22/2021, 3:21 PM  Clinical Narrative:   Patient seen in follow up to current needs.  I wrote down her Medicaid number, MCD transport numbers, and ostomy supply number, and gave her homework to call these places and get the process started on each of them.  We found her cell phone, and I took it to Retail banker to charge for her. TOC will continue to follow during the course of hospitalization.     Expected Discharge Plan: Home/Self Care Barriers to Discharge: No Barriers Identified  Expected Discharge Plan and Services Expected Discharge Plan: Home/Self Care In-house Referral: Clinical Social Work     Living arrangements for the past 2 months: Single Family Home                                       Social Determinants of Health (SDOH) Interventions    Readmission Risk Interventions Readmission Risk Prevention Plan 08/20/2021 04/09/2021  Transportation Screening Complete Complete  Medication Review Press photographer) Complete Complete  PCP or Specialist appointment within 3-5 days of discharge Complete Complete  HRI or [place] Complete Complete  SW Recovery Care/Counseling Consult Complete Complete  Palliative Care Screening Not Applicable Not [place] Not Applicable Not Applicable  Some recent data might be hidden

## 2021-10-23 LAB — COMPREHENSIVE METABOLIC PANEL
ALT: 11 U/L (ref 0–44)
AST: 14 U/L — ABNORMAL LOW (ref 15–41)
Albumin: 2.5 g/dL — ABNORMAL LOW (ref 3.5–5.0)
Alkaline Phosphatase: 39 U/L (ref 38–126)
Anion gap: 6 (ref 5–15)
BUN: 25 mg/dL — ABNORMAL HIGH (ref 8–23)
CO2: 20 mmol/L — ABNORMAL LOW (ref 22–32)
Calcium: 8.1 mg/dL — ABNORMAL LOW (ref 8.9–10.3)
Chloride: 112 mmol/L — ABNORMAL HIGH (ref 98–111)
Creatinine, Ser: 1.24 mg/dL — ABNORMAL HIGH (ref 0.44–1.00)
GFR, Estimated: 50 mL/min — ABNORMAL LOW (ref >60)
Glucose, Bld: 100 mg/dL — ABNORMAL HIGH (ref 70–99)
Potassium: 4.2 mmol/L (ref 3.5–5.1)
Sodium: 138 mmol/L (ref 135–145)
Total Bilirubin: 0.2 mg/dL — ABNORMAL LOW (ref 0.3–1.2)
Total Protein: 4.8 g/dL — ABNORMAL LOW (ref 6.5–8.1)

## 2021-10-23 LAB — CBC WITH DIFFERENTIAL/PLATELET
Abs Immature Granulocytes: 0.02 10*3/uL (ref 0.00–0.07)
Basophils Absolute: 0 10*3/uL (ref 0.0–0.1)
Basophils Relative: 0 %
Eosinophils Absolute: 0.4 10*3/uL (ref 0.0–0.5)
Eosinophils Relative: 6 %
HCT: 26.2 % — ABNORMAL LOW (ref 36.0–46.0)
Hemoglobin: 8.1 g/dL — ABNORMAL LOW (ref 12.0–15.0)
Immature Granulocytes: 0 %
Lymphocytes Relative: 27 %
Lymphs Abs: 1.9 10*3/uL (ref 0.7–4.0)
MCH: 30.5 pg (ref 26.0–34.0)
MCHC: 30.9 g/dL (ref 30.0–36.0)
MCV: 98.5 fL (ref 80.0–100.0)
Monocytes Absolute: 0.4 10*3/uL (ref 0.1–1.0)
Monocytes Relative: 6 %
Neutro Abs: 4.2 10*3/uL (ref 1.7–7.7)
Neutrophils Relative %: 61 %
Platelets: 223 10*3/uL (ref 150–400)
RBC: 2.66 MIL/uL — ABNORMAL LOW (ref 3.87–5.11)
RDW: 13 % (ref 11.5–15.5)
WBC: 6.9 10*3/uL (ref 4.0–10.5)
nRBC: 0 % (ref 0.0–0.2)

## 2021-10-23 LAB — PREALBUMIN: Prealbumin: 20 mg/dL (ref 18–38)

## 2021-10-23 LAB — MAGNESIUM: Magnesium: 1.8 mg/dL (ref 1.7–2.4)

## 2021-10-23 LAB — PHOSPHORUS: Phosphorus: 3.9 mg/dL (ref 2.5–4.6)

## 2021-10-23 LAB — VITAMIN B12: Vitamin B-12: 122 pg/mL — ABNORMAL LOW (ref 180–914)

## 2021-10-23 MED ORDER — OPIUM 10 MG/ML (1%) PO TINC
6.0000 mg | Freq: Four times a day (QID) | ORAL | Status: DC
  Administered 2021-10-23 – 2021-10-27 (×16): 6 mg via ORAL
  Filled 2021-10-23 (×16): qty 1

## 2021-10-23 MED ORDER — TAB-A-VITE/IRON PO TABS
1.0000 | ORAL_TABLET | Freq: Every day | ORAL | Status: DC
  Administered 2021-10-23 – 2021-10-27 (×5): 1 via ORAL
  Filled 2021-10-23 (×5): qty 1

## 2021-10-23 MED ORDER — PAREGORIC 2 MG/5ML PO TINC: 5.0000 mL | Freq: Three times a day (TID) | ORAL | Status: DC

## 2021-10-23 MED ORDER — TRAZODONE HCL 50 MG PO TABS
50.0000 mg | ORAL_TABLET | Freq: Every day | ORAL | Status: DC
  Administered 2021-10-23 – 2021-10-26 (×4): 50 mg via ORAL
  Filled 2021-10-23 (×4): qty 1

## 2021-10-23 NOTE — Progress Notes (Signed)
Mobility Specialist - Progress Note    10/23/21 1227  Mobility  Activity Ambulated in hall  Level of Assistance Standby assist, set-up cues, supervision of patient - no hands on  Assistive Device Other (Comment) (IV Pole)  Distance Ambulated (ft) 450 ft  Mobility Ambulated with assistance in hallway  Mobility Response Tolerated well  Mobility performed by Mobility specialist  $Mobility charge 1 Mobility   Upon entry pt was agreeable to mobilize and requested to use bathroom prior to ambulating 450 ft in hallway while pushing IV pole. No complaints made during session. Pt returned to room and left sitting up in recliner with call bell at side and RN in room.   [place] Specialist Acute Rehabilitation Services Phone: [phone] 10/23/21, 12:28 PM

## 2021-10-23 NOTE — Progress Notes (Signed)
Patient ID: Cynthia [name], female   DOB: 07-Aug-1961, 60 y.o.   MRN: [id number] [facility] Surgery Progress Note     Subjective: CC-  Ostomy output up again, 2675cc. It is thickening up. Denies abdominal pain, n/v. Creatinine down 1.24.  Objective: Vital signs in last 24 hours: Temp:  [98 F (36.7 C)-98.3 F (36.8 C)] 98 F (36.7 C) (12/21 0505) Pulse Rate:  [57-70] 70 (12/21 0505) Resp:  [18-20] 20 (12/21 0505) BP: (105-120)/(61-79) 105/61 (12/21 0505) SpO2:  [100 %] 100 % (12/21 0505) Last BM Date: 10/23/21  Intake/Output from previous day: 12/20 0701 - 12/21 0700 In: 1908 [P.O.:1908] Out: 4050 [Urine:1375; Stool:2675] Intake/Output this shift: Total I/O In: -  Out: 1200 [Urine:800; Stool:400]  PE: Gen:  Alert, NAD, pleasant Pulm: rate and effort normal Abd: Soft, NT/ND, ostomy viable with thin brown stool and air in bag, skin erythema/ excoriation along the right side of her abdomen extending to the right flank significantly improved  Lab Results:  Recent Labs    10/22/21 0514 10/23/21 0528  WBC 6.9 6.9  HGB 8.4* 8.1*  HCT 26.8* 26.2*  PLT 257 223   BMET Recent Labs    10/22/21 0514 10/23/21 0528  NA 139 138  K 4.3 4.2  CL 111 112*  CO2 22 20*  GLUCOSE 71 100*  BUN 21 25*  CREATININE 1.37* 1.24*  CALCIUM 8.5* 8.1*   PT/INR No results for input(s): LABPROT, INR in the last 72 hours. CMP     Component Value Date/Time   NA 138 10/23/2021 0528   NA 136 07/19/2021 1324   K 4.2 10/23/2021 0528   CL 112 (H) 10/23/2021 0528   CO2 20 (L) 10/23/2021 0528   GLUCOSE 100 (H) 10/23/2021 0528   BUN 25 (H) 10/23/2021 0528   BUN 25 07/19/2021 1324   CREATININE 1.24 (H) 10/23/2021 0528   CREATININE 0.94 05/08/2016 0741   CALCIUM 8.1 (L) 10/23/2021 0528   PROT 4.8 (L) 10/23/2021 0528   PROT 5.5 (L) 07/19/2021 1324   ALBUMIN 2.5 (L) 10/23/2021 0528   ALBUMIN 3.4 (L) 07/19/2021 1324   AST 14 (L) 10/23/2021 0528   ALT 11 10/23/2021 0528   ALKPHOS  39 10/23/2021 0528   BILITOT 0.2 (L) 10/23/2021 0528   BILITOT <0.2 07/19/2021 1324   GFRNONAA 50 (L) 10/23/2021 0528   GFRAA 77 12/18/2020 1548   Lipase     Component Value Date/Time   LIPASE 73 (H) 09/14/2021 0525       Studies/Results: No results found.  Anti-infectives: Anti-infectives (From admission, onward)    None        Assessment/Plan -High output Ileostomy  -Pouching Issues with Skin irritation/breakdown -Hx of Crohn's s/p exploratory laparotomy, ileocolectomy with ileostomy, sigmoid colectomy with mucus fistula on 02/19/2021 by Dr. Hilliard Clark at [facility] following for assistance with pouching and skin barriers to assist with skin irritation/breakdown from leaking of ileostomy pouching system >> skin breakdown much improved. They enrolled in [place] program and gave info for [employer] medical supplies - Output remains high. We do not have [employer] here. Will start tincture of opium today 2m TID. Continue imodium 457mQID, iron BID, fibercon BID, lomotil 2 tabs QID, cholestyramine 4g TID. Goal output <1L.  - [facility], working on [facility] assistance. Will see about possibility of having PICC/ IVF 3x per week and weekly CMP  ID - none FEN -  IVF, CM diet VTE - lovenox Foley - none Follow up - ostomy clinic, GI (dismissed from [facility], will refer to [facility]), surgery (after GI, Dr. Marcello Moores has agreed to see)   LOS: 5 days    Wellington Hampshire, [facility] Surgery 10/23/2021, 10:47 AM Please see Amion for pager number during day hours 7:00am-4:30pm

## 2021-10-23 NOTE — Progress Notes (Addendum)
[facility] Progress Note  Patient: Cynthia [name]     ION:[id number]  DOA: 10/17/2021      Date of Service: the patient was seen and examined on 10/23/2021  Brief hospital course: This is a 60 year old female with history of Crohn's disease status post ileocecal ectomy and sigmoidectomy with end ileostomy, GERD, hypertension, RLS who admitted to the hospital for a high output ileostomy and resultant dehydration.  Her ileostomy bag was overflowing, falling off and leaking and she was having skin irritation and breakdown around the site. In the ED, she was given 2 L of normal saline.  General surgery was consulted to assist.  Subjective: No new complaints.  Eating well.  Assessment and Plan: High output ileostomy with dehydration and AKI - Appreciate management by general surgery -She is currently receiving the following meds: Opium tincture, FiberCon, Imodium, Lomotil and Questran - Continue IV fluids to support volume loss-currently receiving LR at 125 cc/h  Severe malnutrition - Secondary to above-continue to follow oral intake  Skin breakdown around ileostomy - Appreciate wound care consult  Time spent in minutes: 35 DVT prophylaxis:  Code Status: Full code Level of Care: Level of care: Telemetry Disposition Plan:  Status is: Inpatient  Remains inpatient appropriate because: On IV fluids to prevent dehydration      Objective:   Vitals:   10/22/21 1220 10/22/21 1926 10/23/21 0505 10/23/21 1231  BP: 120/76 119/79 105/61 117/72  Pulse: (!) 57 61 70 (!) 59  Resp: 19 18 20 18   Temp: 98.3 F (36.8 C) 98 F (36.7 C) 98 F (36.7 C) 98.5 F (36.9 C)  TempSrc: Oral Oral Oral Oral  SpO2: 100% 100% 100% 100%  Weight:      Height:       Filed Weights   10/17/21 0455  Weight: 54.4 kg    Exam: General exam: Appears comfortable  HEENT: PERRLA, oral mucosa moist, no sclera icterus or thrush Respiratory system: Clear to auscultation. Respiratory effort  normal. Cardiovascular system: S1 & S2 heard, regular rate and rhythm Gastrointestinal system: Abdomen soft, non-tender, nondistended. Normal bowel sounds-ileostomy appears intact Central nervous system: Alert and oriented. No focal neurological deficits. Extremities: No cyanosis, clubbing or edema Skin: No rashes or ulcers Psychiatry:  Mood & affect appropriate.     Imaging and lab data Reviewed    CBC: Recent Labs  Lab 10/19/21 0644 10/20/21 0537 10/21/21 0455 10/22/21 0514 10/23/21 0528  WBC 7.3 7.1 6.7 6.9 6.9  NEUTROABS 4.3 3.9 3.7 3.8 4.2  HGB 8.7* 8.1* 8.2* 8.4* 8.1*  HCT 28.0* 25.6* 26.7* 26.8* 26.2*  MCV 97.6 96.2 97.8 97.5 98.5  PLT 260 246 240 257 244   Basic Metabolic Panel: Recent Labs  Lab 10/19/21 0644 10/20/21 0537 10/21/21 0455 10/22/21 0514 10/23/21 0528  NA 136 138 138 139 138  K 4.6 4.2 4.4 4.3 4.2  CL 113* 112* 113* 111 112*  CO2 20* 22 22 22  20*  GLUCOSE 77 74 75 71 100*  BUN 35* 26* 26* 21 25*  CREATININE 1.52* 1.38* 1.36* 1.37* 1.24*  CALCIUM 8.5* 8.3* 8.3* 8.5* 8.1*  MG 1.7 2.2 1.9 1.6* 1.8  PHOS 2.9 2.9 3.0 3.7 3.9   GFR: Estimated Creatinine Clearance: 40.9 mL/min (A) (by C-G formula based on SCr of 1.24 mg/dL (H)).  Time spent: 35 minutes.   Author: Debbe Odea  10/23/2021 5:50 PM  To reach On-call, see care teams to locate the attending and reach out via [email or web address]. Between 7PM-7AM, please  contact night-coverage If you still have difficulty reaching the attending provider, please page the [facility] (Director on Call) for [facility] on amion for assistance.

## 2021-10-23 NOTE — Hospital Course (Addendum)
This is Cynthia [name] 60 year old female with history of Crohn's disease status post ileocecal ectomy and sigmoidectomy with end ileostomy, GERD, hypertension, RLS who admitted to the hospital for Cynthia [name] high output ileostomy and resultant dehydration.  Her ileostomy bag was overflowing, falling off and leaking and she was having skin irritation and breakdown around the site.  General surgery was consulted and she was started on tincture of opium, imodium, iron, fibercon, lomotil, cholestyramine.  GI was c/s by general surgery on 12/22 and CT abd/pelvis obtained showing mild thickening of bowel loops, 2/2 chronic inflammatory bowel disease and/or acute exacerbation or acute infectious enterocolitis.  GI thought less likely infection or acute crohn's, but recommended mesalamine to treat crohn's disease.  She's currently improving, likely to discharge once she gets ride.

## 2021-10-23 NOTE — Progress Notes (Signed)
Initial Nutrition Assessment  DOCUMENTATION CODES:   Severe malnutrition in context of chronic illness  INTERVENTION:   -Provided "Ileostomy Nutrition Therapy" handout for patient to review  -Recommend Vitamin B-12 supplementation (B-12 low at 134)  NUTRITION DIAGNOSIS:   Severe Malnutrition related to chronic illness as evidenced by severe fat depletion, severe muscle depletion.  GOAL:   Patient will meet greater than or equal to 90% of their needs  MONITOR:   PO intake, Supplement acceptance, Labs, Weight trends, I & O's  REASON FOR ASSESSMENT:   Consult Diet education  ASSESSMENT:   60 year old Caucasian female with a past medical history significant for but not limited to anemia, anxiety and depression, bronchitis, history of Crohn's ileitis who underwent ileocecectomy and sigmoidectomy in April of this year with an ileostomy pouch, GERD, history of migraine headaches, history of heart murmur, hypertension, IBS, history of incontinence, history of MVP, RLS, history of jejunal intussusception, history of high output ileostomy who presented to the ED with issues with pouching, skin breakdown and hyper ileostomy.  She has been to the ED multiple visits for this and was admitted back in November for an AKI with high output and she was placed on Imodium and iron with improvement in her output.  She really presents today again with her ileostomy bag falling off and complaining of burning and inflammation around her ileostomy site.  12/15: admitted   Patient just returned from walking in hall with mobility specialist. Pt reports feeling tired. States she has been eating anything she is served. Per chart review, pt with provided ileostomy diet education during previous admission in October 2022. Provided a new handout for her to review here to help with meal item choices. Reviewed foods that would help thicken output and foods that may cause greater output.  Pt states she feels like  she been eating well at home and has not noticed any weight changes but states her clothes fit the same as they did.   Per weight records, pt has been ~120 lbs since October 2022.  Ostomy output: 2675 ml  Medications: Questran, Lomotil, Ferrous sulfate, Imodium, Multivitamin with minerals daily, Fibercon, Lactated ringers  Labs reviewed:  Low B-12 (134)  NUTRITION - FOCUSED PHYSICAL EXAM:  Flowsheet Row Most Recent Value  Orbital Region Mild depletion  Upper Arm Region Severe depletion  Thoracic and Lumbar Region Severe depletion  Buccal Region Moderate depletion  Temple Region Moderate depletion  Clavicle Bone Region Moderate depletion  Clavicle and Acromion Bone Region Moderate depletion  Scapular Bone Region Moderate depletion  Dorsal Hand Moderate depletion  Patellar Region Severe depletion  Anterior Thigh Region Severe depletion  Posterior Calf Region Moderate depletion  Edema (RD Assessment) None  Hair Reviewed  Eyes Reviewed  Mouth Reviewed  Skin Reviewed       Diet Order:   Diet Order             Diet Carb Modified Fluid consistency: Thin; Room service appropriate? Yes  Diet effective now                   EDUCATION NEEDS:   Education needs have been addressed  Skin:  Skin Assessment: Reviewed RN Assessment  Last BM:  12/21 -ileostomy  Height:   Ht Readings from Last 1 Encounters:  10/17/21 5\' 4"  (1.626 m)    Weight:   Wt Readings from Last 1 Encounters:  10/17/21 54.4 kg    BMI:  Body mass index is 20.6 kg/m.  Estimated  Nutritional Needs:   Kcal:  1650-1850  Protein:  80-95g  Fluid:  1.7L/day  Clayton Bibles, MS, RD, LDN Inpatient Clinical Dietitian Contact information available via Amion

## 2021-10-24 ENCOUNTER — Inpatient Hospital Stay (HOSPITAL_COMMUNITY): Payer: Medicaid Other

## 2021-10-24 DIAGNOSIS — E539 Vitamin B deficiency, unspecified: Secondary | ICD-10-CM

## 2021-10-24 DIAGNOSIS — D649 Anemia, unspecified: Secondary | ICD-10-CM

## 2021-10-24 DIAGNOSIS — K30 Functional dyspepsia: Secondary | ICD-10-CM

## 2021-10-24 MED ORDER — IOHEXOL 9 MG/ML PO SOLN
500.0000 mL | ORAL | Status: AC
  Administered 2021-10-24: 09:00:00 500 mL via ORAL

## 2021-10-24 MED ORDER — CYANOCOBALAMIN 1000 MCG/ML IJ SOLN
1000.0000 ug | INTRAMUSCULAR | Status: DC
  Administered 2021-10-24: 12:00:00 1000 ug via INTRAMUSCULAR
  Filled 2021-10-24: qty 1

## 2021-10-24 MED ORDER — OXYCODONE HCL 5 MG PO TABS
5.0000 mg | ORAL_TABLET | Freq: Once | ORAL | Status: AC | PRN
  Administered 2021-10-24: 04:00:00 5 mg via ORAL
  Filled 2021-10-24: qty 1

## 2021-10-24 MED ORDER — SODIUM CHLORIDE (PF) 0.9 % IJ SOLN
INTRAMUSCULAR | Status: AC
  Filled 2021-10-24: qty 50

## 2021-10-24 MED ORDER — IOHEXOL 350 MG/ML SOLN
80.0000 mL | Freq: Once | INTRAVENOUS | Status: AC | PRN
  Administered 2021-10-24: 11:00:00 80 mL via INTRAVENOUS

## 2021-10-24 MED ORDER — MESALAMINE ER 250 MG PO CPCR
1000.0000 mg | ORAL_CAPSULE | Freq: Three times a day (TID) | ORAL | Status: DC
  Administered 2021-10-24 – 2021-10-27 (×9): 1000 mg via ORAL
  Filled 2021-10-24 (×10): qty 4

## 2021-10-24 MED ORDER — IOHEXOL 9 MG/ML PO SOLN
ORAL | Status: AC
  Administered 2021-10-24: 11:00:00 500 mL via ORAL
  Filled 2021-10-24: qty 1000

## 2021-10-24 MED ORDER — FAMOTIDINE IN NACL 20-0.9 MG/50ML-% IV SOLN
20.0000 mg | Freq: Two times a day (BID) | INTRAVENOUS | Status: DC
  Administered 2021-10-24 – 2021-10-25 (×3): 20 mg via INTRAVENOUS
  Filled 2021-10-24 (×3): qty 50

## 2021-10-24 NOTE — Assessment & Plan Note (Addendum)
BP has been low Holding losartan/ HCTZ

## 2021-10-24 NOTE — Assessment & Plan Note (Signed)
Add Pepcid

## 2021-10-24 NOTE — Progress Notes (Signed)
Patient ID: Cynthia [name], female   DOB: [birth year], 60 y.o.   MRN: [id number] [facility] Surgery Progress Note     Subjective: CC-  Feeling more encouraged today. Ostomy output significantly thicker. Still with 2.2L recorded output but this is trending down. Tolerating diet.  Objective: Vital signs in last 24 hours: Temp:  [97.8 F (36.6 C)-98.5 F (36.9 C)] 97.8 F (36.6 C) (12/22 0404) Pulse Rate:  [59-77] 77 (12/22 0404) Resp:  [18] 18 (12/22 0404) BP: (106-135)/(65-83) 106/65 (12/22 0404) SpO2:  [100 %] 100 % (12/22 0404) Last BM Date: 10/23/21  Intake/Output from previous day: 12/21 0701 - 12/22 0700 In: 8954.6 [P.O.:3143; I.V.:5811.6] Out: 4000 [Urine:1800; Stool:2200] Intake/Output this shift: No intake/output data recorded.  PE:  Gen:  Alert, NAD, pleasant Pulm: rate and effort normal Abd: Soft, NT/ND, ostomy viable with thicker brown stool and air in bag, skin erythema/ excoriation along the right side of her abdomen extending to the right flank significantly improved  Lab Results:  Recent Labs    10/22/21 0514 10/23/21 0528  WBC 6.9 6.9  HGB 8.4* 8.1*  HCT 26.8* 26.2*  PLT 257 223   BMET Recent Labs    10/22/21 0514 10/23/21 0528  NA 139 138  K 4.3 4.2  CL 111 112*  CO2 22 20*  GLUCOSE 71 100*  BUN 21 25*  CREATININE 1.37* 1.24*  CALCIUM 8.5* 8.1*   PT/INR No results for input(s): LABPROT, INR in the last 72 hours. CMP     Component Value Date/Time   NA 138 10/23/2021 0528   NA 136 07/19/2021 1324   K 4.2 10/23/2021 0528   CL 112 (H) 10/23/2021 0528   CO2 20 (L) 10/23/2021 0528   GLUCOSE 100 (H) 10/23/2021 0528   BUN 25 (H) 10/23/2021 0528   BUN 25 07/19/2021 1324   CREATININE 1.24 (H) 10/23/2021 0528   CREATININE 0.94 05/08/2016 0741   CALCIUM 8.1 (L) 10/23/2021 0528   PROT 4.8 (L) 10/23/2021 0528   PROT 5.5 (L) 07/19/2021 1324   ALBUMIN 2.5 (L) 10/23/2021 0528   ALBUMIN 3.4 (L) 07/19/2021 1324   AST 14 (L) 10/23/2021  0528   ALT 11 10/23/2021 0528   ALKPHOS 39 10/23/2021 0528   BILITOT 0.2 (L) 10/23/2021 0528   BILITOT <0.2 07/19/2021 1324   GFRNONAA 50 (L) 10/23/2021 0528   GFRAA 77 12/18/2020 1548   Lipase     Component Value Date/Time   LIPASE 73 (H) 09/14/2021 0525       Studies/Results: No results found.  Anti-infectives: Anti-infectives (From admission, onward)    None        Assessment/Plan -High output Ileostomy  -Pouching Issues with Skin irritation/breakdown -Hx of Crohn's s/p exploratory laparotomy, ileocolectomy with ileostomy, sigmoid colectomy with mucus fistula on 02/19/2021 by Dr. Hilliard Clark at [facility] following for assistance with pouching and skin barriers to assist with skin irritation/breakdown from leaking of ileostomy pouching system >> skin breakdown much improved. They enrolled in [place] program and gave info for [employer] medical supplies - Output remains high but is significantly thicker today. Continue tincture of opium 26m QID, imodium 457mQID, iron BID, fibercon BID, lomotil 2 tabs QID, cholestyramine 4g TID. Goal output <1L.  - We have asked [facility] to see while she is here. CT scan ordered to evaluate for any active Crohns. She has not been on any treatment for her Crohns in a very long time, possibly better management of her Crohns would help  ileostomy output as well. - TOC team following, working on transportation assistance and home health for PICC/ IVF 3x per week and weekly CMP   ID - none FEN - IVF, CM diet VTE - lovenox Foley - none Follow up - ostomy clinic, GI [facility]), surgery (after GI, Dr. Marcello Moores has agreed to see)   LOS: 6 days    Wellington Hampshire, [facility] Surgery 10/24/2021, 9:37 AM Please see Amion for pager number during day hours 7:00am-4:30pm

## 2021-10-24 NOTE — Consult Note (Signed)
Cynthia [name] ostomy follow up Patient receiving care in [place]. Stoma type/location: RUQ ileostomy Stomal assessment/size: almost 2 inches, round, moist, pink Peristomal assessment: intact Treatment options for stomal/peristomal skin: barrier ring Output: pudding consistency light brown Ostomy pouching: 1pc. convex Education provided: Patient was able to correctly empty the existing pouch, remove it, and prepare and place a new pouching system, including the belt. No need for powder or high output system today. Patient has agreed to contact [employer] and provide them with her insurance coverage info and request products for home.  Val Riles, RN, MSN, CWOCN, CNS-BC, pager [phone]

## 2021-10-24 NOTE — Progress Notes (Addendum)
[facility] Progress Note  Patient: Cynthia [name]     BTD:[id number]  DOA: 10/17/2021      Date of Service: the patient was seen and examined on 10/24/2021  Brief hospital course: This is a 60 year old female with history of Crohn's disease status post ileocecal ectomy and sigmoidectomy with end ileostomy, GERD, hypertension, RLS who admitted to the hospital for a high output ileostomy and resultant dehydration.  Her ileostomy bag was overflowing, falling off and leaking and she was having skin irritation and breakdown around the site. In the ED, she was given 2 L of normal saline.  General surgery was consulted to assist.  Subjective:  She complains of indigestion today.   Assessment and Plan: * AKI (acute kidney injury) ([place])- (present on admission) - cont NS at 75 cc/hr to prevent dehydration in setting of high output ileostomy  High output ileostomy ([place]) Appreciate management by general surgery -She is currently receiving the following meds: Opium tincture, FiberCon, Imodium, Lomotil and Questran - Continue IV fluids to support volume loss-currently receiving LR at 125 cc/h - GI and surgery obtaining a CT today to look for active Crohns disease   Indigestion Add Pepcid  Crohn's ileitis ([place])- (present on admission) Hx of Crohn's s/p exploratory laparotomy, ileocolectomy with ileostomy, sigmoid colectomy with mucus fistula on 02/19/2021 by Dr. 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  Vitamin B deficiency, unspecified B12 level 134 on 12/17 and 122 on 12/21 - given B12 s/c 1000 mcg today     Nutrition Problem: Severe Malnutrition Etiology: chronic illness     Time spent in minutes: 35 DVT prophylaxis:  Code Status:   Code Status: Full Code  Level of Care: Level of care: Telemetry Disposition Plan:  Status is: Inpatient  Remains inpatient  appropriate because: on IV fluid to prevent dehydration from ileostomy       Objective:   Vitals:   10/23/21 0505 10/23/21 1231 10/23/21 1943 10/24/21 0404  BP: 105/61 117/72 135/83 106/65  Pulse: 70 (!) 59 69 77  Resp: 20 18 18 18   Temp: 98 F (36.7 C) 98.5 F (36.9 C) 98.2 F (36.8 C) 97.8 F (36.6 C)  TempSrc: Oral Oral Oral Oral  SpO2: 100% 100% 100% 100%  Weight:      Height:       Filed Weights   10/17/21 0455  Weight: 54.4 kg    Exam: General exam: Appears comfortable  HEENT: PERRLA, oral mucosa moist, no sclera icterus or thrush Respiratory system: Clear to auscultation. Respiratory effort normal. Cardiovascular system: S1 & S2 heard, regular rate and rhythm Gastrointestinal system: Abdomen soft, non-tender, nondistended. Normal bowel sounds  - ostomy has dark stool Central nervous system: Alert and oriented. No focal neurological deficits. Extremities: No cyanosis, clubbing or edema Skin: No rashes or ulcers Psychiatry:  Mood & affect appropriate.   Imaging and lab data Reviewed    CBC: Recent Labs  Lab 10/19/21 0644 10/20/21 0537 10/21/21 0455 10/22/21 0514 10/23/21 0528  WBC 7.3 7.1 6.7 6.9 6.9  NEUTROABS 4.3 3.9 3.7 3.8 4.2  HGB 8.7* 8.1* 8.2* 8.4* 8.1*  HCT 28.0* 25.6* 26.7* 26.8* 26.2*  MCV 97.6 96.2 97.8 97.5 98.5  PLT 260 246 240 257 106   Basic Metabolic Panel: Recent Labs  Lab 10/19/21 0644 10/20/21 0537 10/21/21 0455 10/22/21 0514 10/23/21 0528  NA 136 138 138  139 138  K 4.6 4.2 4.4 4.3 4.2  CL 113* 112* 113* 111 112*  CO2 20* 22 22 22  20*  GLUCOSE 77 74 75 71 100*  BUN 35* 26* 26* 21 25*  CREATININE 1.52* 1.38* 1.36* 1.37* 1.24*  CALCIUM 8.5* 8.3* 8.3* 8.5* 8.1*  MG 1.7 2.2 1.9 1.6* 1.8  PHOS 2.9 2.9 3.0 3.7 3.9   GFR: Estimated Creatinine Clearance: 40.9 mL/min (A) (by C-G formula based on SCr of 1.24 mg/dL (H)).  Time spent: 35 minutes.   Author: Debbe Odea  10/24/2021 12:44 PM

## 2021-10-24 NOTE — Assessment & Plan Note (Addendum)
B12 level 134 on 12/17 and 122 on 12/21 - given B12 s/c 1000 mcg today - continue b12 supplementation at discharge

## 2021-10-24 NOTE — Assessment & Plan Note (Signed)
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/17 consistent with AOCD

## 2021-10-24 NOTE — Consult Note (Addendum)
Referring Provider: [facility] Primary Care Physician:  Denita Lung, MD Primary Gastroenterologist:  Althia Forts  Reason for Consultation:  Crohn's  HPI: Cynthia [name] is a 60 y.o. female  history of Crohn's disease status post ileocecal ectomy and sigmoidectomy with end ileostomy, GERD, hypertension, RLS who admitted to the hospital for a high output ileostomy and resultant dehydration.  Hx of Crohn's s/p exploratory laparotomy, ileocolectomy with ileostomy, sigmoid colectomy with mucus fistula on 02/19/2021 by Dr. Hilliard Clark at [facility]  Patient originally presented to ED with her ileopstomy bag oveflowing, falling off, leaking, and skin irration/breakdown. Being followed by surgery and wound care. Patient states she is emptying her bag about 3-4 times per day whereas previously she was only emptying it 1-2 times per day.  Patient states on and off throughout her life she has had episodes of diarrhea, no blood.  Thinks that her father may have Crohn's though undiagnosed (father is 68).  Patient states she was diagnosed with Crohn's disease in March 2022.  She has not been on any medication for Crohn's disease.  Denies abdominal pain, nausea, vomiting.  Denies hematochezia/melena.  Social alcohol. Tobacco user with 1- 1.5 PPD for the majority of her life up until quitting March 2022. Occasional (once weekly) THC user. Will take ibuprofen once weekly for headaches. Denies family history of colon cancer.  Colonoscopy 08/2019 with Dr. Bryan Lemma: significant looping of colon, Two 3-76mm tubular adenomas in rectum and recto-sigmoid colon, vascular pattern in descending, transverse, ascending colon, and cecum with negative biopsies. Ileitis with biopsy for Crohn's   Past Medical History:  Diagnosis Date   Anemia 2012   Anxiety    Bronchitis    Crohn's ileitis ([place])    Depression    GERD (gastroesophageal reflux disease)    Headache(784.0)    MIGRAINE   Heart murmur    Hiatal hernia    Hypertension     IBS (irritable bowel syndrome)    Incontinence    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HCC)    MVP (mitral valve prolapse)    RLS (restless legs syndrome)    Tubular adenoma of colon 08/2019    Past Surgical History:  Procedure Laterality Date   ABDOMINAL HYSTERECTOMY  06/17/2011   Procedure: HYSTERECTOMY ABDOMINAL;  Surgeon: Arloa Koh;  Location: [facility];  Service: Gynecology;  Laterality: N/A;  Converted to Abdominal Hysterectomy with lysis of adhesions    ANTERIOR AND POSTERIOR REPAIR  02/25/2012   Procedure: ANTERIOR (CYSTOCELE) AND POSTERIOR REPAIR (RECTOCELE);  Surgeon: Daria Pastures, MD;  Location: [facility];  Service: Gynecology;  Laterality: N/A;  ANterior cystocele repair ONLY   BIOPSY  08/29/2019   Procedure: BIOPSY;  Surgeon: Lavena Bullion, DO;  Location: [facility];  Service: Gastroenterology;;   BLADDER SUSPENSION  02/25/2012   Procedure: TRANSVAGINAL TAPE (TVT) PROCEDURE;  Surgeon: Daria Pastures, MD;  Location: [facility];  Service: Gynecology;  Laterality: N/A;   COLONOSCOPY WITH PROPOFOL N/A 08/29/2019   Procedure: COLONOSCOPY WITH PROPOFOL;  Surgeon: Lavena Bullion, DO;  Location: [place];  Service: Gastroenterology;  Laterality: N/A;   CYSTOSCOPY  02/25/2012   Procedure: CYSTOSCOPY;  Surgeon: Daria Pastures, MD;  Location: [facility];  Service: Gynecology;  Laterality: N/A;   ILEOSTOMY     KNEE ARTHROSCOPY  2010   LAPAROSCOPIC ASSISTED VAGINAL HYSTERECTOMY  06/17/2011   Procedure: LAPAROSCOPIC ASSISTED VAGINAL HYSTERECTOMY;  Surgeon: Arloa Koh;  Location: [facility];  Service: Gynecology;  Laterality: N/A;  Attempted    POLYPECTOMY  08/29/2019   Procedure: POLYPECTOMY;  Surgeon: Lavena Bullion, DO;  Location: [facility];  Service: Gastroenterology;;   SALPINGOOPHORECTOMY  06/17/2011   Procedure: SALPINGO OOPHERECTOMY;  Surgeon: Arloa Koh;  Location: [facility];  Service: Gynecology;  Laterality: Bilateral;   TONSILLECTOMY      Prior to Admission  medications   Medication Sig Start Date End Date Taking? Authorizing Provider  acetaminophen (TYLENOL) 500 MG tablet Take 1,000 mg by mouth every 6 (six) hours as needed for headache or mild pain.   Yes [provider]  loperamide (IMODIUM) 2 MG capsule Take 1 capsule (2 mg total) by mouth 3 (three) times daily. 09/29/21  Yes Maczis, Barth Kirks, PA-C  losartan-hydrochlorothiazide (HYZAAR) 50-12.5 MG tablet Take 1 tablet by mouth daily.   Yes [provider]  polycarbophil (FIBERCON) 625 MG tablet Take 1 tablet (625 mg total) by mouth 2 (two) times daily. 09/29/21  Yes Maczis, Barth Kirks, PA-C  venlafaxine XR (EFFEXOR-XR) 150 MG 24 hr capsule Take 150 mg by mouth daily with breakfast.   Yes [provider]  cephALEXin (KEFLEX) 500 MG capsule Take 1 capsule (500 mg total) by mouth 4 (four) times daily. Patient not taking: Reported on 10/17/2021 10/03/21   Kommor, Debe Coder, MD  cholestyramine (QUESTRAN) 4 g packet Take 1 packet (4 g total) by mouth 3 (three) times daily. Patient not taking: Reported on 10/17/2021 08/20/21 09/26/21  Barb Merino, MD  ferrous sulfate 325 (65 FE) MG tablet Take 1 tablet (325 mg total) by mouth 2 (two) times daily with a meal. Patient not taking: Reported on 10/17/2021 09/29/21   Maczis, Barth Kirks, PA-C  oxyCODONE (OXY IR/ROXICODONE) 5 MG immediate release tablet Take 1 tablet (5 mg total) by mouth every 6 (six) hours as needed for breakthrough pain. Patient not taking: Reported on 10/17/2021 09/29/21   Jillyn Ledger, PA-C  Zinc Oxide (TRIPLE PASTE) 12.8 % ointment Apply topically as needed for irritation. Patient not taking: Reported on 10/17/2021 09/29/21   Jillyn Ledger, PA-C    Scheduled Meds:  cholestyramine  4 g Oral TID   cyanocobalamin  1,000 mcg Intramuscular Weekly   diphenoxylate-atropine  2 tablet Oral QID   enoxaparin (LOVENOX) injection  40 mg Subcutaneous Q24H   ferrous sulfate  325 mg Oral BID WC   iohexol  500 mL  Oral Q1H   iohexol       loperamide  4 mg Oral QID   multivitamins with iron  1 tablet Oral Daily   Opium  6 mg Oral QID   polycarbophil  625 mg Oral BID   traZODone  50 mg Oral QHS   venlafaxine XR  150 mg Oral Q breakfast   Continuous Infusions:  lactated ringers 125 mL/hr at 10/23/21 1447   PRN Meds:.acetaminophen **OR** acetaminophen, diphenhydrAMINE, diphenhydrAMINE-zinc acetate, lidocaine, ondansetron **OR** ondansetron (ZOFRAN) IV  Allergies as of 10/17/2021 - Review Complete 10/17/2021  Allergen Reaction Noted   Iodine Other (See Comments) 03/17/2008    Family History  Problem Relation Age of Onset   Diabetes Mother    Dementia Mother    Clotting disorder Father    Macular degeneration Father    Crohn's disease Sister    Colon cancer Neg Hx    Esophageal cancer Neg Hx    Pancreatic cancer Neg Hx    Stomach cancer Neg Hx    Liver disease Neg Hx     Social History   Socioeconomic History   Marital status: Widowed  Spouse name: Not on file   Number of children: 1   Years of education: Not on file   Highest education level: Not on file  Occupational History   Occupation: Designer, television/film set: [employer]  Tobacco Use   Smoking status: Former    Packs/day: 0.00    Types: Cigarettes   Smokeless tobacco: Never   Tobacco comments:    none in 2 weeks as of 12/04/20  Vaping Use   Vaping Use: Some days  Substance and Sexual Activity   Alcohol use: Not Currently   Drug use: Not Currently    Types: Cocaine, Marijuana    Comment: clean x 8 weeks   Sexual activity: Not Currently  Other Topics Concern   Not on file  Social History Narrative   Lives with boyfriend who she states is an alcoholic   Social Determinants of Radio broadcast assistant Strain: Not on file  Food Insecurity: Not on file  Transportation Needs: Not on file  Physical Activity: Not on file  Stress: Not on file  Social Connections: Not on file  Intimate Partner Violence: Not on  file    Review of Systems: Review of Systems  Constitutional:  Negative for chills and fever.  HENT:  Negative for ear pain and tinnitus.   Eyes:  Negative for blurred vision and double vision.  Respiratory:  Negative for cough and hemoptysis.   Cardiovascular:  Negative for chest pain and palpitations.  Gastrointestinal:  Negative for abdominal pain, blood in stool, constipation, diarrhea, heartburn, melena, nausea and vomiting.  Genitourinary:  Negative for dysuria and urgency.  Musculoskeletal:  Negative for myalgias and neck pain.  Skin:  Negative for itching and rash.       Skin breakdown at ileostomy site   Neurological:  Negative for seizures and loss of consciousness.  Psychiatric/Behavioral:  Negative for depression. The patient is not nervous/anxious.     Physical Exam:Physical Exam Constitutional:      Appearance: Normal appearance.  HENT:     Head: Normocephalic and atraumatic.     Nose: Nose normal. No congestion.     Mouth/Throat:     Mouth: Mucous membranes are moist.     Pharynx: Oropharynx is clear.  Eyes:     Extraocular Movements: Extraocular movements intact.     Comments: Conjunctival pallor  Cardiovascular:     Rate and Rhythm: Normal rate and regular rhythm.  Pulmonary:     Effort: Pulmonary effort is normal. No respiratory distress.  Abdominal:     General: Abdomen is flat. Bowel sounds are normal. There is no distension.     Palpations: Abdomen is soft. There is no mass.     Tenderness: There is no abdominal tenderness. There is no guarding or rebound.     Hernia: No hernia is present.     Comments: Ileostomy bag present with brown stool  Musculoskeletal:        General: No swelling. Normal range of motion.     Cervical back: Normal range of motion and neck supple.  Skin:    General: Skin is warm and dry.  Neurological:     General: No focal deficit present.     Mental Status: She is alert and oriented to person, place, and time.  Psychiatric:         Mood and Affect: Mood normal.        Behavior: Behavior normal.        Thought Content: Thought  content normal.        Judgment: Judgment normal.    Vital signs: Vitals:   10/23/21 1943 10/24/21 0404  BP: 135/83 106/65  Pulse: 69 77  Resp: 18 18  Temp: 98.2 F (36.8 C) 97.8 F (36.6 C)  SpO2: 100% 100%   Last BM Date: 10/23/21    GI:  Lab Results: Recent Labs    10/22/21 0514 10/23/21 0528  WBC 6.9 6.9  HGB 8.4* 8.1*  HCT 26.8* 26.2*  PLT 257 223   BMET Recent Labs    10/22/21 0514 10/23/21 0528  NA 139 138  K 4.3 4.2  CL 111 112*  CO2 22 20*  GLUCOSE 71 100*  BUN 21 25*  CREATININE 1.37* 1.24*  CALCIUM 8.5* 8.1*   LFT Recent Labs    10/23/21 0528  PROT 4.8*  ALBUMIN 2.5*  AST 14*  ALT 11  ALKPHOS 39  BILITOT 0.2*   PT/INR No results for input(s): LABPROT, INR in the last 72 hours.   Studies/Results: No results found.  Impression: Crohn's with high output ileostomy - Hx of Crohn's s/p exploratory laparotomy, ileocolectomy with ileostomy, sigmoid colectomy with mucus fistula on 02/19/2021 by Dr. Hilliard Clark at [facility] - surgery and wound care following ileostomy with skin breakdown - hgb 8.1 (stable), normal MCV - no leukocytosis - BUN 25, Cr 1.24  Plan: Planned for CT ab/pelvis today to assess for Crohn's flare per surgery. Patient denies symptoms. Has never been on anything for Crohn's. Will await CT results. Patient will need to be seen as an outpatient when discharged for Crohn's management.  Continue supportive care [facility] will follow  LOS: 6 days   Jasmine Mcbeth Radford Pax  PA-C 10/24/2021, 8:51 AM  Contact #  [phone]

## 2021-10-24 NOTE — Assessment & Plan Note (Addendum)
Improved, baseline kidney function difficult to tell with significant fluctuation, though ranges between 1.2-2 Continue IVF for now Hold thiazide and arb

## 2021-10-24 NOTE — Assessment & Plan Note (Signed)
Hx ofCrohn's s/pexploratory laparotomy, ileocolectomy with ileostomy, sigmoid colectomy with mucus fistula on 4/19/2022by Dr. Hilliard Clark at [facility]

## 2021-10-24 NOTE — Assessment & Plan Note (Addendum)
Appreciate management by general surgery and gastroenterology Tincture of opium 6 mg QID, imodium 4 mg QID, iron BID, fibercon BID, lomotil 2 tabs QID, choleystyramine 4 g TID Mesalamine added per GI Goal output < 1 L  Will need GI and surgery follow up outpatient CT with mild thickening of bowel loops, 2/2 chronic inflammatory bowel disease and/or acute exacerbation or acute infectious enterocolitis - RUQ ileostomy - mild gallbladder thickening - small bilateral effusions

## 2021-10-25 LAB — BASIC METABOLIC PANEL
Anion gap: 4 — ABNORMAL LOW (ref 5–15)
Anion gap: 3 — ABNORMAL LOW (ref 5–15)
BUN: 25 mg/dL — ABNORMAL HIGH (ref 8–23)
BUN: 24 mg/dL — ABNORMAL HIGH (ref 8–23)
CO2: 18 mmol/L — ABNORMAL LOW (ref 22–32)
CO2: 20 mmol/L — ABNORMAL LOW (ref 22–32)
Calcium: 8.5 mg/dL — ABNORMAL LOW (ref 8.9–10.3)
Calcium: 8.5 mg/dL — ABNORMAL LOW (ref 8.9–10.3)
Chloride: 116 mmol/L — ABNORMAL HIGH (ref 98–111)
Chloride: 115 mmol/L — ABNORMAL HIGH (ref 98–111)
Creatinine, Ser: 1.51 mg/dL — ABNORMAL HIGH (ref 0.44–1.00)
Creatinine, Ser: 1.5 mg/dL — ABNORMAL HIGH (ref 0.44–1.00)
GFR, Estimated: 39 mL/min — ABNORMAL LOW (ref >60)
GFR, Estimated: 39 mL/min — ABNORMAL LOW (ref >60)
Glucose, Bld: 69 mg/dL — ABNORMAL LOW (ref 70–99)
Glucose, Bld: 92 mg/dL (ref 70–99)
Potassium: 6.3 mmol/L (ref 3.5–5.1)
Potassium: 4.7 mmol/L (ref 3.5–5.1)
Sodium: 138 mmol/L (ref 135–145)
Sodium: 138 mmol/L (ref 135–145)

## 2021-10-25 MED ORDER — DIPHENHYDRAMINE HCL 25 MG PO CAPS
25.0000 mg | ORAL_CAPSULE | Freq: Once | ORAL | Status: AC
  Administered 2021-10-25: 23:00:00 25 mg via ORAL
  Filled 2021-10-25: qty 1

## 2021-10-25 MED ORDER — FAMOTIDINE 20 MG PO TABS
20.0000 mg | ORAL_TABLET | Freq: Every day | ORAL | Status: DC
  Administered 2021-10-26 – 2021-10-27 (×2): 20 mg via ORAL
  Filled 2021-10-25 (×3): qty 1

## 2021-10-25 NOTE — TOC Progression Note (Signed)
Transition of Care [facility]) - Progression Note    Patient Details  Name: Cynthia [name] MRN: [id number] Date of Birth: [birth year]  Transition of Care [facility]) CM/SW [place], [place] Phone Number: 10/25/2021, 8:50 AM  Clinical Narrative:   Met with patient in follow up to homework I had given her.  She called ostomy supply and ordered supplies to be delivered to home.  She called [facility] and was told she needed to fill out an application and they would mail it.  I pulled it off the website and gave to her so she can fill it out while here.  She voiced appreciation. Says her roommate is able to provide transportation to appointments until she gets [facility] transport arranged "if it works with her schedule." TOC will continue to follow during the course of hospitalization.     Expected Discharge Plan: Home/Self Care Barriers to Discharge: No Barriers Identified  Expected Discharge Plan and Services Expected Discharge Plan: Home/Self Care In-house Referral: Clinical Social Work     Living arrangements for the past 2 months: Single Family Home                                       Social Determinants of Health (SDOH) Interventions    Readmission Risk Interventions Readmission Risk Prevention Plan 08/20/2021 04/09/2021  Transportation Screening Complete Complete  Medication Review Press photographer) Complete Complete  PCP or Specialist appointment within 3-5 days of discharge Complete Complete  HRI or [place] Complete Complete  SW Recovery Care/Counseling Consult Complete Complete  Palliative Care Screening Not Applicable Not Grandfather Not Applicable Not Applicable  Some recent data might be hidden

## 2021-10-25 NOTE — Progress Notes (Addendum)
[facility] Progress Note  Patient: Cynthia [name]     YSA:[id number]  DOA: 10/17/2021      Date of Service: the patient was seen and examined on 10/25/2021  Brief hospital course: This is a 60 year old female with history of Crohn's disease status post ileocecal ectomy and sigmoidectomy with end ileostomy, GERD, hypertension, RLS who admitted to the hospital for a high output ileostomy and resultant dehydration.  Her ileostomy bag was overflowing, falling off and leaking and she was having skin irritation and breakdown around the site. In the ED, she was given 2 L of normal saline.  General surgery was consulted to assist.  Subjective:  No new complaints.   Assessment and Plan: * AKI (acute kidney injury) ([place])- (present on admission)  - BUN 59 and Cr 1.96 on 12/15 - Improved to 24 and 1.50 - cont IVF to replace losses from high output ileostomy  High output ileostomy (HCC)Crohn's ileitis ([place])- (present on admission) Hx of Crohn's s/p exploratory laparotomy, ileocolectomy with ileostomy, sigmoid colectomy with mucus fistula on 02/19/2021 by Dr. Hilliard Clark at [place] by general surgery- GI consulted by General surgery for assistance -She is currently receiving the following meds: Opium tincture, FiberCon, Imodium, Lomotil, Questran, Mesalamine per general surgery -   CT ordered 12/22 to look for active Crohns disease - no crohn's disease found- GI has signed off - Mesalamine added on 12/22- stool not as watery as before - waiting for output to drop < 1 L   Erroneous finding of hyperkalemia today - K 6.3- rechecked and found to be normal  Indigestion Added Pepcid 12/22    Benign essential HTN BP has been low Holding losartan/ HCTZ  Anemia of chronic disease 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/17 consistent with AOCD  Vitamin B deficiency, unspecified B12 level 134 on 12/17 and 122 on 12/21 - given B12 s/c 1000 mcg today     Nutrition Problem: Severe  Malnutrition Etiology: chronic illness     Time spent in minutes: 35 DVT prophylaxis:  Code Status:   Code Status: Full Code  Level of Care: Level of care: Telemetry Disposition Plan:  Status is: Inpatient  Remains inpatient appropriate because: on IV fluid to prevent dehydration from ileostomy       Objective:   Vitals:   10/24/21 1428 10/24/21 2112 10/25/21 0430 10/25/21 1455  BP: 111/69 106/62 116/69 125/73  Pulse: 71 74 70 75  Resp: 16 20 16 17   Temp: 98.4 F (36.9 C) 98.5 F (36.9 C) 98.3 F (36.8 C) 98.8 F (37.1 C)  TempSrc: Oral Oral Oral Oral  SpO2: 100% 99% 100% 100%  Weight:      Height:       Filed Weights   10/17/21 0455  Weight: 54.4 kg    Exam: General exam: Appears comfortable  HEENT: PERRLA, oral mucosa moist, no sclera icterus or thrush Respiratory system: Clear to auscultation. Respiratory effort normal. Cardiovascular system: S1 & S2 heard, regular rate and rhythm Gastrointestinal system: Abdomen soft, non-tender, nondistended. Normal bowel sounds  - soft stool in ileostomy Central nervous system: Alert and oriented. No focal neurological deficits. Extremities: No cyanosis, clubbing or edema Skin: No rashes or ulcers Psychiatry:  Mood & affect appropriate.    Imaging and lab data Reviewed    CBC: Recent Labs  Lab 10/19/21 0644 10/20/21 0537 10/21/21 0455 10/22/21 0514 10/23/21 0528  WBC 7.3 7.1 6.7 6.9 6.9  NEUTROABS 4.3 3.9 3.7 3.8 4.2  HGB 8.7* 8.1* 8.2* 8.4*  8.1*  HCT 28.0* 25.6* 26.7* 26.8* 26.2*  MCV 97.6 96.2 97.8 97.5 98.5  PLT 260 246 240 257 340    Basic Metabolic Panel: Recent Labs  Lab 10/19/21 0644 10/20/21 0537 10/21/21 0455 10/22/21 0514 10/23/21 0528 10/25/21 0818 10/25/21 0958  NA 136 138 138 139 138 138 138  K 4.6 4.2 4.4 4.3 4.2 6.3* 4.7  CL 113* 112* 113* 111 112* 116* 115*  CO2 20* 22 22 22  20* 18* 20*  GLUCOSE 77 74 75 71 100* 69* 92  BUN 35* 26* 26* 21 25* 25* 24*  CREATININE 1.52* 1.38*  1.36* 1.37* 1.24* 1.51* 1.50*  CALCIUM 8.5* 8.3* 8.3* 8.5* 8.1* 8.5* 8.5*  MG 1.7 2.2 1.9 1.6* 1.8  --   --   PHOS 2.9 2.9 3.0 3.7 3.9  --   --     GFR: Estimated Creatinine Clearance: 33.8 mL/min (A) (by C-G formula based on SCr of 1.5 mg/dL (H)).  Time spent: 35 minutes.   Author: Debbe Odea  10/25/2021 4:40 PM

## 2021-10-25 NOTE — Progress Notes (Signed)
[facility] Gastroenterology Progress Note  Cynthia [name] OFVWAQLR 60 y.o. 1961/06/20  CC:  Crohn's   Subjective: Patient states she is having pain around her ostomy site, mainly near skin breakdown. Hurts mainly when she coughs. Denies nausea/vomiting. Tolerating diet well. States she is still emptying bag 3-5 times per day.  ROS : Review of Systems  Gastrointestinal:  Negative for abdominal pain, blood in stool, constipation, diarrhea, heartburn, melena, nausea and vomiting.       Pain at ostomy     Objective: Vital signs in last 24 hours: Vitals:   10/24/21 2112 10/25/21 0430  BP: 106/62 116/69  Pulse: 74 70  Resp: 20 16  Temp: 98.5 F (36.9 C) 98.3 F (36.8 C)  SpO2: 99% 100%    Physical Exam:  General:  Alert, cooperative, no distress, appears stated age  Head:  Normocephalic, without obvious abnormality, atraumatic  Eyes:  Anicteric sclera, EOM's intact, conjunctival pallor  Lungs:   Clear to auscultation bilaterally, respirations unlabored  Heart:  Regular rate and rhythm, S1, S2 normal  Abdomen:   RUQ ostomy bag intact with pink stoma, semi-liquid brown stool. Nontender, soft, nondistended abdomen with +BS    Lab Results: Recent Labs    10/23/21 0528  NA 138  K 4.2  CL 112*  CO2 20*  GLUCOSE 100*  BUN 25*  CREATININE 1.24*  CALCIUM 8.1*  MG 1.8  PHOS 3.9   Recent Labs    10/23/21 0528  AST 14*  ALT 11  ALKPHOS 39  BILITOT 0.2*  PROT 4.8*  ALBUMIN 2.5*   Recent Labs    10/23/21 0528  WBC 6.9  NEUTROABS 4.2  HGB 8.1*  HCT 26.2*  MCV 98.5  PLT 223   No results for input(s): LABPROT, INR in the last 72 hours.    Assessment Crohn's with high output ileostomy - Hx of Crohn's s/p exploratory laparotomy, ileocolectomy with ileostomy, sigmoid colectomy with mucus fistula on 02/19/2021 by Dr. Hilliard Clark at [facility] - surgery and wound care following ileostomy with skin breakdown - hgb 8.1 (stable), normal MCV - no leukocytosis - BUN 25, Cr 1.24 - CT  ab/pelvis 12/22: mildly thickened bowel loops with question of chronic inflammation vs acute infection.   Plan: Still not having signs of Crohn's flare. Continuing to empty bag 3-5 times per day. Continue supportive care Recommend outpatient follow up at our GI clinic [facility] will sign off. Please contact us if we can be of any further assistance during this hospital stay.   Garnette Scheuermann PA-C 10/25/2021, 8:55 AM  Contact #  [phone]

## 2021-10-25 NOTE — Progress Notes (Signed)
Subjective/Chief Complaint: Pt without complaint    Objective: Vital signs in last 24 hours: Temp:  [98.3 F (36.8 C)-98.5 F (36.9 C)] 98.3 F (36.8 C) (12/23 0430) Pulse Rate:  [70-74] 70 (12/23 0430) Resp:  [16-20] 16 (12/23 0430) BP: (106-116)/(62-69) 116/69 (12/23 0430) SpO2:  [99 %-100 %] 100 % (12/23 0430) Last BM Date: 10/23/21  Intake/Output from previous day: 12/22 0701 - 12/23 0700 In: 4380.4 [P.O.:1656; I.V.:2674.4; IV Piggyback:50] Out: 2025 [Urine:750; GDJME:2683] Intake/Output this shift: No intake/output data recorded.   Gen:  Alert, NAD, pleasant Pulm: rate and effort normal Abd: Soft, NT/ND, ostomy viable with thicker brown stool and air in bag, skin erythema/ excoriation along the right side of her abdomen extending to the right flank significantly improved Lab Results:  Recent Labs    10/23/21 0528  WBC 6.9  HGB 8.1*  HCT 26.2*  PLT 223   BMET Recent Labs    10/23/21 0528  NA 138  K 4.2  CL 112*  CO2 20*  GLUCOSE 100*  BUN 25*  CREATININE 1.24*  CALCIUM 8.1*   PT/INR No results for input(s): LABPROT, INR in the last 72 hours. ABG No results for input(s): PHART, HCO3 in the last 72 hours.  Invalid input(s): PCO2, PO2  Studies/Results: CT ABDOMEN PELVIS W CONTRAST  Result Date: 10/24/2021 CLINICAL DATA:  Inflammatory bowel disease. High output ileostomy/pouching issues with skin irritation/breakdown. History of Crohn's disease. EXAM: CT ABDOMEN AND PELVIS WITH CONTRAST TECHNIQUE: Multidetector CT imaging of the abdomen and pelvis was performed using the standard protocol following bolus administration of intravenous contrast. CONTRAST:  49m OMNIPAQUE IOHEXOL 350 MG/ML SOLN COMPARISON:  Multiple prior CT examination including most recent dated September 18, 2021 FINDINGS: Lower chest: Small bilateral pleural effusions. Lung bases are clear. Hepatobiliary: No focal liver abnormality is seen. No biliary ductal dilatation. Mild  pericholecystic fluid, which may be secondary to volume overload. No evidence of cholelithiasis. Pancreas: Unremarkable. No pancreatic ductal dilatation or surrounding inflammatory changes. Spleen: Normal in size without focal abnormality. Adrenals/Urinary Tract: Adrenal glands are unremarkable. Kidneys are normal, without renal calculi, focal lesion, or hydronephrosis. Bladder is unremarkable. Stomach/Bowel: The stomach is distended with ingested material. Postsurgical changes with right upper quadrant ostomy. Bowel loops are normal in caliber. There is generalized thickening of the bowel lobes, which may be secondary to chronic inflammatory bowel disease or acute exacerbation. Vascular/Lymphatic: No significant vascular findings are present. No enlarged abdominal or pelvic lymph nodes. Reproductive: Uterus not visualized.  No adnexal mass. Other: There is marked generalized anasarca, which may be secondary to volume overload. No fluid collection or abscess about the ileostomy. Mild skin thickening about the ileostomy, which may represent cellulitis. Correlate with physical examination findings. Musculoskeletal: Degenerate disc disease of the lumbar spine. No acute osseous abnormality. IMPRESSION: 1. Mild thickening of the bowel loops, which may be secondary to chronic inflammatory bowel disease and/or acute exacerbation or acute infectious enterocolitis. Clinical correlation is suggested. 2. Right upper quadrant ileostomy with mild skin thickening about the ostomy without evidence of drainable fluid collection or abscess. 3. Mild thickening of the gallbladder wall without gallstones, nonspecific finding, it may be secondary to volume overload. 4.  Small bilateral pleural effusions.  Lung bases are clear. Electronically Signed   By: IKeane PoliceD.O.   On: 10/24/2021 12:19    Anti-infectives: Anti-infectives (From admission, onward)    None       Assessment/Plan: -High output Ileostomy  -Pouching Issues  with Skin irritation/breakdown -Hx  of Crohn's s/p exploratory laparotomy, ileocolectomy with ileostomy, sigmoid colectomy with mucus fistula on 02/19/2021 by Dr. Hilliard Clark at [facility] following for assistance with pouching and skin barriers to assist with skin irritation/breakdown from leaking of ileostomy pouching system >> skin breakdown much improved. They enrolled in [facility] program and gave info for [employer] medical supplies - Output remains high but is significantly thicker today. Continue tincture of opium 55m QID, imodium 468mQID, iron BID, fibercon BID, lomotil 2 tabs QID, cholestyramine 4g TID. Goal output <1L.   GI added pentasa   - We have asked [facility] to see while she is here. CT scan ordered to evaluate for any active Crohns. She has not been on any treatment for her Crohns in a very long time, possibly better management of her Crohns would help ileostomy output as well. - TOC team following, working on transportation assistance and home health for PICC/ IVF 3x per week and weekly CMP   ID - none FEN - IVF, CM diet VTE - lovenox Foley - none Follow up - ostomy clinic, GI ([facility] (after GI, Dr. ThMarcello Mooresas agreed to see)   Total time 20 minutes   LOS: 7 days    ThTurner DanielsD  10/25/2021

## 2021-10-25 NOTE — Progress Notes (Signed)
Mobility Specialist - Progress Note    10/25/21 1536  Mobility  Activity Ambulated in hall  Level of Assistance Modified independent, requires aide device or extra time  Assistive Device Other (Comment) (IV Pole)  Distance Ambulated (ft) 1000 ft  Mobility Ambulated independently in hallway  Mobility Response Tolerated well  Mobility performed by Mobility specialist  $Mobility charge 1 Mobility   Upon entry pt was agreeable to mobilize and requested to push IV pole while ambulating 1000 ft. No complaints made during session. Pt hopes to d/c soon. Pt returned to room after session and was left in bed with call bell at side.   [place] Specialist [facility] Phone: [phone] 10/25/21, 3:38 PM

## 2021-10-25 NOTE — Progress Notes (Signed)
CRITICAL VALUE STICKER  CRITICAL VALUE: Potassium 6.3  RECEIVER (on-site recipient of call): Peri Jefferson LPN  DATE & TIME NOTIFIED: 10/25/2021 0913  MESSENGER (representative from lab): Tollie Pizza  MD NOTIFIED: Rizwan   TIME OF NOTIFICATION: 0913  RESPONSE: Dr. Wynelle Cleveland ordered repeat potassium. Repeat potassium level at 0958 was within normal limits at 4.7

## 2021-10-26 MED ORDER — OPIUM 10 MG/ML (1%) PO TINC: 6.0000 mg | Freq: Four times a day (QID) | ORAL | 0 refills | Status: AC

## 2021-10-26 MED ORDER — VITAMIN B-12 1000 MCG PO TABS: 1000.0000 ug | ORAL_TABLET | Freq: Every day | ORAL | 0 refills | Status: AC

## 2021-10-26 MED ORDER — MESALAMINE ER 250 MG PO CPCR: 1000.0000 mg | ORAL_CAPSULE | Freq: Three times a day (TID) | ORAL | 0 refills | Status: DC

## 2021-10-26 MED ORDER — CHOLESTYRAMINE 4 G PO PACK: 4.0000 g | PACK | Freq: Three times a day (TID) | ORAL | 0 refills | Status: DC

## 2021-10-26 MED ORDER — LOPERAMIDE HCL 2 MG PO CAPS: 2.0000 mg | ORAL_CAPSULE | Freq: Three times a day (TID) | ORAL | 0 refills | Status: AC

## 2021-10-26 MED ORDER — DIPHENOXYLATE-ATROPINE 2.5-0.025 MG PO TABS
2.0000 | ORAL_TABLET | Freq: Four times a day (QID) | ORAL | 0 refills | Status: AC
Start: 2021-10-26 — End: 2021-11-25

## 2021-10-26 NOTE — Progress Notes (Signed)
Pt has been discharged.  As of yet, she has found no ride home and it is after hours for complimentary service. Pt is actively calling her roommate without response. She will continue to try to get a ride home this evening.  Reporting off to next shift.

## 2021-10-26 NOTE — Progress Notes (Signed)
Subjective/Chief Complaint: No complaints   Objective: Vital signs in last 24 hours: Temp:  [98.6 F (37 C)-99 F (37.2 C)] 98.6 F (37 C) (12/24 0342) Pulse Rate:  [74-76] 74 (12/24 0342) Resp:  [16-17] 16 (12/23 2146) BP: (115-131)/(73-80) 131/78 (12/24 0342) SpO2:  [100 %] 100 % (12/24 0342) Last BM Date: 10/25/21  Intake/Output from previous day: 12/23 0701 - 12/24 0700 In: 1560 [P.O.:1560] Out: -  Intake/Output this shift: No intake/output data recorded.   Gen:  Alert, NAD, pleasant Pulm: rate and effort normal Abd: Soft, NT/ND, ostomy viable with thicker brown stool and air in bag, skin erythema/ excoriation along the right side of her abdomen extending to the right flank significantly improved Lab Results:  No results for input(s): WBC, HGB, HCT, PLT in the last 72 hours. BMET Recent Labs    10/25/21 0818 10/25/21 0958  NA 138 138  K 6.3* 4.7  CL 116* 115*  CO2 18* 20*  GLUCOSE 69* 92  BUN 25* 24*  CREATININE 1.51* 1.50*  CALCIUM 8.5* 8.5*   PT/INR No results for input(s): LABPROT, INR in the last 72 hours. ABG No results for input(s): PHART, HCO3 in the last 72 hours.  Invalid input(s): PCO2, PO2  Studies/Results: CT ABDOMEN PELVIS W CONTRAST  Result Date: 10/24/2021 CLINICAL DATA:  Inflammatory bowel disease. High output ileostomy/pouching issues with skin irritation/breakdown. History of Crohn's disease. EXAM: CT ABDOMEN AND PELVIS WITH CONTRAST TECHNIQUE: Multidetector CT imaging of the abdomen and pelvis was performed using the standard protocol following bolus administration of intravenous contrast. CONTRAST:  66m OMNIPAQUE IOHEXOL 350 MG/ML SOLN COMPARISON:  Multiple prior CT examination including most recent dated September 18, 2021 FINDINGS: Lower chest: Small bilateral pleural effusions. Lung bases are clear. Hepatobiliary: No focal liver abnormality is seen. No biliary ductal dilatation. Mild pericholecystic fluid, which may be secondary  to volume overload. No evidence of cholelithiasis. Pancreas: Unremarkable. No pancreatic ductal dilatation or surrounding inflammatory changes. Spleen: Normal in size without focal abnormality. Adrenals/Urinary Tract: Adrenal glands are unremarkable. Kidneys are normal, without renal calculi, focal lesion, or hydronephrosis. Bladder is unremarkable. Stomach/Bowel: The stomach is distended with ingested material. Postsurgical changes with right upper quadrant ostomy. Bowel loops are normal in caliber. There is generalized thickening of the bowel lobes, which may be secondary to chronic inflammatory bowel disease or acute exacerbation. Vascular/Lymphatic: No significant vascular findings are present. No enlarged abdominal or pelvic lymph nodes. Reproductive: Uterus not visualized.  No adnexal mass. Other: There is marked generalized anasarca, which may be secondary to volume overload. No fluid collection or abscess about the ileostomy. Mild skin thickening about the ileostomy, which may represent cellulitis. Correlate with physical examination findings. Musculoskeletal: Degenerate disc disease of the lumbar spine. No acute osseous abnormality. IMPRESSION: 1. Mild thickening of the bowel loops, which may be secondary to chronic inflammatory bowel disease and/or acute exacerbation or acute infectious enterocolitis. Clinical correlation is suggested. 2. Right upper quadrant ileostomy with mild skin thickening about the ostomy without evidence of drainable fluid collection or abscess. 3. Mild thickening of the gallbladder wall without gallstones, nonspecific finding, it may be secondary to volume overload. 4.  Small bilateral pleural effusions.  Lung bases are clear. Electronically Signed   By: IKeane PoliceD.O.   On: 10/24/2021 12:19    Anti-infectives: Anti-infectives (From admission, onward)    None       Assessment/Plan:   -High output Ileostomy -no I's and O's recorded.  Difficult to discern if this  is  getting better but her pattern has been improving.  Recommend strict I's and O's to determine when she is fit for discharge   -Pouching Issues with Skin irritation/breakdown -Hx of Crohn's s/p exploratory laparotomy, ileocolectomy with ileostomy, sigmoid colectomy with mucus fistula on 02/19/2021 by Dr. Hilliard Clark at [facility] following for assistance with pouching and skin barriers to assist with skin irritation/breakdown from leaking of ileostomy pouching system >> skin breakdown much improved. They enrolled in [place] program and gave info for [employer] medical supplies - Output remains high but is significantly thicker today. Continue tincture of opium 26m QID, imodium 452mQID, iron BID, fibercon BID, lomotil 2 tabs QID, cholestyramine 4g TID. Goal output <1L.    GI added pentasa    - We have asked [facility] to see while she is here. CT scan ordered to evaluate for any active Crohns. She has not been on any treatment for her Crohns in a very long time, possibly better management of her Crohns would help ileostomy output as well. - TOC team following, working on transportation assistance and home health for PICC/ IVF 3x per week and weekly CMP   ID - none FEN - IVF, CM diet VTE - lovenox Foley - none Follow up - ostomy clinic, GI ([facility] (after GI, Dr. ThMarcello Mooresas agreed to see)   Total time 20 minutes   ThJoyice Fasterornett MD  10/26/2021

## 2021-10-26 NOTE — Progress Notes (Signed)
Pt will be discharged this afternoon. Awaiting her "friend" to pick her up. Discharge instructions given/explained with pt verbalizing understanding. Pt aware of importance of followup with PCP. Pt left with ostomy supplies.

## 2021-10-26 NOTE — Discharge Summary (Signed)
Physician Discharge Summary  Cynthia [name] DVV:[id number] DOB: 1961-10-13 DOA: 10/17/2021  PCP: Cynthia Lung, MD  Admit date: 10/17/2021 Discharge date: 10/26/2021  Time spent: 40 minutes  Recommendations for Outpatient Follow-up:  Follow outpatient CBC/CMP Follow output outpatient - if develops constipation, d/c lomotil, then imodium Needs refill of tincture of opium outpatient Follow up with GI outpatient in 4-6 weeks Follow up with general surgery outpatient  Follow renal function outpatient   Discharge Diagnoses:  Principal Problem:   AKI (acute kidney injury) ([place]) Active Problems:   Anxiety associated with depression   Hyponatremia   Crohn's ileitis ([place])   Indigestion   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  Her ileostomy bag was overflowing, falling off and leaking and she was having skin irritation and breakdown around the site.  General surgery was consulted and she was started on tincture of opium, imodium, iron, fibercon, lomotil, cholestyramine.  GI was c/s by general surgery on 12/22 and CT abd/pelvis obtained showing mild thickening of bowel loops, 2/2 chronic inflammatory bowel disease and/or acute exacerbation or acute infectious enterocolitis.  GI thought less likely infection or acute crohn's, but recommended mesalamine to treat crohn's disease.  She's currently improving, likely to discharge once she gets ride.  Hospital Course:  * AKI (acute kidney injury) ([place])-  (present on admission) Improved, baseline kidney function difficult to tell with significant fluctuation, though ranges between 1.2-2 Continue IVF for now Hold thiazide and arb  Indigestion Add Pepcid  Crohn's ileitis ([place])- (present on admission) Hx of Crohn's s/p exploratory laparotomy, ileocolectomy with ileostomy, sigmoid colectomy with mucus fistula on 02/19/2021 by Dr. Hilliard [name] at [facility] output ileostomy [facility]) Appreciate management by general surgery and gastroenterology Tincture of opium 6 mg QID, imodium 4 mg QID, iron BID, fibercon BID, lomotil 2 tabs QID, choleystyramine 4 g TID Mesalamine added per GI Goal output < 1 L  Will need GI and surgery follow up outpatient CT with mild thickening of bowel loops, 2/2 chronic inflammatory bowel disease and/or acute exacerbation or acute infectious enterocolitis - RUQ ileostomy - mild gallbladder thickening - small bilateral effusions  Benign essential HTN BP has been low Holding losartan/ HCTZ  Vitamin B deficiency, unspecified B12 level 134 on 12/17 and 122 on 12/21 - given B12 s/c 1000 mcg today - continue b12 supplementation at discharge  Anemia of chronic disease 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 Cardiovascular: RRR Lungs: unlabored Abdomen: Soft, nontender, nondistended - ostomy with minimal output Neurological: Alert and oriented 3. Moves all extremities 4 . Cranial nerves II through XII grossly intact. Skin: Warm and dry. No rashes or  lesions. Extremities: No clubbing or cyanosis. No edema.   Discharge Instructions   Discharge Instructions     Amb Referral to Ostomy  Clinic   Complete by: As directed    Reason for referral modifiers: Consultation for problems such as pouch leaking or skin irritation   Call MD for:  difficulty breathing, headache or visual disturbances   Complete by: As directed    Call MD for:  extreme fatigue   Complete by: As directed    Call MD for:  hives   Complete by: As directed    Call MD for:  persistant dizziness or light-headedness   Complete by: As directed    Call MD for:  persistant nausea and vomiting   Complete by: As directed    Call MD for:  redness, tenderness, or signs of infection (pain, swelling, redness, odor or green/yellow discharge around incision site)   Complete by: As directed    Call MD for:  severe uncontrolled pain   Complete by: As directed    Call MD for:  temperature >100.4   Complete by: As directed    Diet - low sodium heart healthy   Complete by: As directed    Discharge instructions   Complete by: As directed    You were seen for high ostomy output.  We've started you on mesalamine for your crohn's.  You'll continue cholestyramine, tincture of opium, imodium, fibercon, lomotil.    If you start to develop constipation stop the lomotil.  Then stop the imodium if it continues.  I'll prescribe 7 days of the tincture of opium, but you'll need to get refills from your PCP since this is Cynthia [name] controlled substance.  You have B12 deficiency.  Follow up with your PCP regarding injections.  Hold your losartan and HCTZ until you see your PCP in follow up.  Return for new, recurrent, or worsening symptoms.  Please ask your PCP to request records from this hospitalization so they know what was done and what the next steps will be.   Increase activity slowly   Complete by: As directed       Allergies as of 10/26/2021       Reactions   Iodine Other (See Comments)   The patient said she had Cynthia [name] reaction from iodine in some eye drops- caused eyes to turn red;  pt has no reactions to CT contrast         Medication List     STOP taking these medications    cephALEXin 500 MG capsule Commonly known as: KEFLEX   losartan-hydrochlorothiazide 50-12.5 MG tablet Commonly known as: HYZAAR   oxyCODONE 5 MG immediate release tablet Commonly known as: Oxy IR/ROXICODONE       TAKE these medications    acetaminophen 500 MG tablet Commonly known as: TYLENOL Take 1,000 mg by mouth every 6 (six) hours as needed for headache or mild pain.   cholestyramine 4 g packet Commonly known as: QUESTRAN Take 1 packet (4 g total) by mouth 3 (three) times daily.   diphenoxylate-atropine 2.5-0.025 MG tablet Commonly known as: LOMOTIL Take 2 tablets by mouth 4 (four) times daily.   ferrous sulfate 325 (65 FE) MG tablet Take 1 tablet (325 mg total) by mouth 2 (two) times daily with [place] meal.   loperamide 2 MG capsule Commonly known as: IMODIUM Take 1 capsule (2 mg total) by mouth 3 (three) times daily.   mesalamine 250 MG CR capsule Commonly known as: PENTASA Take 4 capsules (  1,000 mg total) by mouth 3 (three) times daily.   Opium 10 MG/ML (1%) Tinc Take 0.6 mLs (6 mg total) by mouth 4 (four) times daily for 7 days.   polycarbophil 625 MG tablet Commonly known as: FIBERCON Take 1 tablet (625 mg total) by mouth 2 (two) times daily.   venlafaxine XR 150 MG 24 hr capsule Commonly known as: EFFEXOR-XR Take 150 mg by mouth daily with breakfast.   vitamin B-12 1000 MCG tablet Commonly known as: CYANOCOBALAMIN Take 1 tablet (1,000 mcg total) by mouth daily.   Zinc Oxide 12.8 % ointment Commonly known as: TRIPLE PASTE Apply topically as needed for irritation.       Allergies  Allergen Reactions   Iodine Other (See Comments)    The patient said she had Delesia Martinek reaction from iodine in some eye drops- caused eyes to turn red;  pt has no reactions to CT contrast      The results of significant diagnostics from this hospitalization (including imaging, microbiology, ancillary and laboratory)  are listed below for reference.    Significant Diagnostic Studies: CT ABDOMEN PELVIS W CONTRAST  Result Date: 10/24/2021 CLINICAL DATA:  Inflammatory bowel disease. High output ileostomy/pouching issues with skin irritation/breakdown. History of Crohn's disease. EXAM: CT ABDOMEN AND PELVIS WITH CONTRAST TECHNIQUE: Multidetector CT imaging of the abdomen and pelvis was performed using the standard protocol following bolus administration of intravenous contrast. CONTRAST:  9mL OMNIPAQUE IOHEXOL 350 MG/ML SOLN COMPARISON:  Multiple prior CT examination including most recent dated September 18, 2021 FINDINGS: Lower chest: Small bilateral pleural effusions. [name] bases are clear. Hepatobiliary: No focal liver abnormality is seen. No biliary ductal dilatation. Mild pericholecystic fluid, which may be secondary to volume overload. No evidence of cholelithiasis. Pancreas: Unremarkable. No pancreatic ductal dilatation or surrounding inflammatory changes. Spleen: Normal in size without focal abnormality. Adrenals/Urinary Tract: Adrenal glands are unremarkable. Kidneys are normal, without renal calculi, focal lesion, or hydronephrosis. Bladder is unremarkable. Stomach/Bowel: The stomach is distended with ingested material. Postsurgical changes with right upper quadrant ostomy. Bowel loops are normal in caliber. There is generalized thickening of the bowel lobes, which may be secondary to chronic inflammatory bowel disease or acute exacerbation. Vascular/Lymphatic: No significant vascular findings are present. No enlarged abdominal or pelvic lymph nodes. Reproductive: Uterus not visualized.  No adnexal mass. Other: There is marked generalized anasarca, which may be secondary to volume overload. No fluid collection or abscess about the ileostomy. Mild skin thickening about the ileostomy, which may represent cellulitis. Correlate with physical examination findings. Musculoskeletal: Degenerate disc disease of the lumbar  spine. No acute osseous abnormality. IMPRESSION: 1. Mild thickening of the bowel loops, which may be secondary to chronic inflammatory bowel disease and/or acute exacerbation or acute infectious enterocolitis. Clinical correlation is suggested. 2. Right upper quadrant ileostomy with mild skin thickening about the ostomy without evidence of drainable fluid collection or abscess. 3. Mild thickening of the gallbladder wall without gallstones, nonspecific finding, it may be secondary to volume overload. 4.  Small bilateral pleural effusions.  [name] bases are clear. Electronically Signed   By: Keane Police D.O.   On: 10/24/2021 12:19    Microbiology: Recent Results (from the past 240 hour(s))  Resp Panel by RT-PCR (Flu Avedis Bevis&B, Covid) Nasopharyngeal Swab     Status: None   Collection Time: 10/17/21  1:53 PM   Specimen: Nasopharyngeal Swab; Nasopharyngeal(NP) swabs in vial transport medium  Result Value Ref Range Status   SARS Coronavirus 2 by RT PCR NEGATIVE NEGATIVE  Final    Comment: (NOTE) SARS-CoV-2 target nucleic acids are NOT DETECTED.  The SARS-CoV-2 RNA is generally detectable in upper respiratory specimens during the acute phase of infection. The lowest concentration of SARS-CoV-2 viral copies this assay can detect is 138 copies/mL. Artez Regis negative result does not preclude SARS-Cov-2 infection and should not be used as the sole basis for treatment or other patient management decisions. Burney Calzadilla negative result may occur with  improper specimen collection/handling, submission of specimen other than nasopharyngeal swab, presence of viral mutation(s) within the areas targeted by this assay, and inadequate number of viral copies(<138 copies/mL). Byran Bilotti negative result must be combined with clinical observations, patient history, and epidemiological information. The expected result is Negative.  Fact Sheet for Patients:  EntrepreneurPulse.com.au  Fact Sheet for Healthcare Providers:   IncredibleEmployment.be  This test is no t yet approved or cleared by the [place] FDA and  has been authorized for detection and/or diagnosis of SARS-CoV-2 by FDA under an Emergency Use Authorization (EUA). This EUA will remain  in effect (meaning this test can be used) for the duration of the COVID-19 declaration under Section 564(b)(1) of the Act, 21 U.S.C.section 360bbb-3(b)(1), unless the authorization is terminated  or revoked sooner.       Influenza Janay Canan by PCR NEGATIVE NEGATIVE Final   Influenza B by PCR NEGATIVE NEGATIVE Final    Comment: (NOTE) The Xpert Xpress SARS-CoV-2/FLU/RSV plus assay is intended as an aid in the diagnosis of influenza from Nasopharyngeal swab specimens and should not be used as Eathan Groman sole basis for treatment. Nasal washings and aspirates are unacceptable for Xpert Xpress SARS-CoV-2/FLU/RSV testing.  Fact Sheet for Patients: EntrepreneurPulse.com.au  Fact Sheet for Healthcare Providers: IncredibleEmployment.be  This test is not yet approved or cleared by the [place] FDA and has been authorized for detection and/or diagnosis of SARS-CoV-2 by FDA under an Emergency Use Authorization (EUA). This EUA will remain in effect (meaning this test can be used) for the duration of the COVID-19 declaration under Section 564(b)(1) of the Act, 21 U.S.C. section 360bbb-3(b)(1), unless the authorization is terminated or revoked.  Performed at [facility], [place] [address], [place], [place] [address]      Labs: Basic Metabolic Panel: Recent Labs  Lab 10/20/21 0537 10/21/21 0455 10/22/21 0514 10/23/21 0528 10/25/21 0818 10/25/21 0958  NA 138 138 139 138 138 138  K 4.2 4.4 4.3 4.2 6.3* 4.7  CL 112* 113* 111 112* 116* 115*  CO2 22 22 22  20* 18* 20*  GLUCOSE 74 75 71 100* 69* 92  BUN 26* 26* 21 25* 25* 24*  CREATININE 1.38* 1.36* 1.37* 1.24* 1.51* 1.50*  CALCIUM 8.3* 8.3*  8.5* 8.1* 8.5* 8.5*  MG 2.2 1.9 1.6* 1.8  --   --   PHOS 2.9 3.0 3.7 3.9  --   --    Liver Function Tests: Recent Labs  Lab 10/20/21 0537 10/21/21 0455 10/22/21 0514 10/23/21 0528  AST 15 13* 16 14*  ALT 15 12 13 11   ALKPHOS 39 40 42 39  BILITOT 0.4 0.4 0.5 0.2*  PROT 5.0* 5.0* 5.2* 4.8*  ALBUMIN 2.6* 2.5* 2.6* 2.5*   No results for input(s): LIPASE, AMYLASE in the last 168 hours. No results for input(s): AMMONIA in the last 168 hours. CBC: Recent Labs  Lab 10/20/21 0537 10/21/21 0455 10/22/21 0514 10/23/21 0528  WBC 7.1 6.7 6.9 6.9  NEUTROABS 3.9 3.7 3.8 4.2  HGB 8.1* 8.2* 8.4* 8.1*  HCT 25.6* 26.7* 26.8*  26.2*  MCV 96.2 97.8 97.5 98.5  PLT 246 240 257 223   Cardiac Enzymes: No results for input(s): CKTOTAL, CKMB, CKMBINDEX, TROPONINI in the last 168 hours. BNP: BNP (last 3 results) No results for input(s): BNP in the last 8760 hours.  ProBNP (last 3 results) No results for input(s): PROBNP in the last 8760 hours.  CBG: No results for input(s): GLUCAP in the last 168 hours.     Signed:  Fayrene Helper MD.  [facility] 10/26/2021, 8:19 PM

## 2021-10-26 NOTE — Assessment & Plan Note (Signed)
resolved 

## 2021-10-26 NOTE — Progress Notes (Signed)
[facility] Progress Note  Cynthia [name] [id number] 60 y.o. May 25, 1961   Subjective: Feels ok. Denies abdominal pain. Ostomy output more formed the last 2 days.  Objective: Vital signs: Vitals:   10/26/21 0342 10/26/21 1213  BP: 131/78 135/86  Pulse: 74 64  Resp:    Temp: 98.6 F (37 C) 97.8 F (36.6 C)  SpO2: 100% 100%    Physical Exam: Gen: lethargic, thin, no acute distress  HEENT: anicteric sclera CV: RRR Chest: CTA B Abd: brownish-green semi-soft stool in ostomy bag, nontender, nondistended, +BS Ext: no edema  Lab Results: Recent Labs    10/25/21 0818 10/25/21 0958  NA 138 138  K 6.3* 4.7  CL 116* 115*  CO2 18* 20*  GLUCOSE 69* 92  BUN 25* 24*  CREATININE 1.51* 1.50*  CALCIUM 8.5* 8.5*   No results for input(s): AST, ALT, ALKPHOS, BILITOT, PROT, ALBUMIN in the last 72 hours. No results for input(s): WBC, NEUTROABS, HGB, HCT, MCV, PLT in the last 72 hours.    Assessment/Plan: Crohn's disease with high ostomy outpt that clinically seems to be resolving. Continue Pentasa as outpt. Continue anti-motility agents unless constipation develops and then d/c Lomotil and Imodium. Office will arrange f/u for her for 4-6 weeks. D/W Dr. Florene Glen.   Cynthia [name] 10/26/2021, 2:21 PM  Questions please call [phone] Patient ID: Cynthia [name], female   DOB: [birth year], 60 y.o.   MRN: [id number]

## 2021-10-27 LAB — CBC WITH DIFFERENTIAL/PLATELET
Abs Immature Granulocytes: 0.03 10*3/uL (ref 0.00–0.07)
Basophils Absolute: 0 10*3/uL (ref 0.0–0.1)
Basophils Relative: 0 %
Eosinophils Absolute: 0.4 10*3/uL (ref 0.0–0.5)
Eosinophils Relative: 6 %
HCT: 27.1 % — ABNORMAL LOW (ref 36.0–46.0)
Hemoglobin: 8.2 g/dL — ABNORMAL LOW (ref 12.0–15.0)
Immature Granulocytes: 1 %
Lymphocytes Relative: 23 %
Lymphs Abs: 1.5 10*3/uL (ref 0.7–4.0)
MCH: 30.3 pg (ref 26.0–34.0)
MCHC: 30.3 g/dL (ref 30.0–36.0)
MCV: 100 fL (ref 80.0–100.0)
Monocytes Absolute: 0.4 10*3/uL (ref 0.1–1.0)
Monocytes Relative: 6 %
Neutro Abs: 4.3 10*3/uL (ref 1.7–7.7)
Neutrophils Relative %: 64 %
Platelets: 207 10*3/uL (ref 150–400)
RBC: 2.71 MIL/uL — ABNORMAL LOW (ref 3.87–5.11)
RDW: 13.3 % (ref 11.5–15.5)
WBC: 6.6 10*3/uL (ref 4.0–10.5)
nRBC: 0 % (ref 0.0–0.2)

## 2021-10-27 LAB — COMPREHENSIVE METABOLIC PANEL
ALT: 13 U/L (ref 0–44)
AST: 17 U/L (ref 15–41)
Albumin: 2.5 g/dL — ABNORMAL LOW (ref 3.5–5.0)
Alkaline Phosphatase: 37 U/L — ABNORMAL LOW (ref 38–126)
Anion gap: 5 (ref 5–15)
BUN: 26 mg/dL — ABNORMAL HIGH (ref 8–23)
CO2: 21 mmol/L — ABNORMAL LOW (ref 22–32)
Calcium: 8.2 mg/dL — ABNORMAL LOW (ref 8.9–10.3)
Chloride: 114 mmol/L — ABNORMAL HIGH (ref 98–111)
Creatinine, Ser: 1.44 mg/dL — ABNORMAL HIGH (ref 0.44–1.00)
GFR, Estimated: 41 mL/min — ABNORMAL LOW (ref >60)
Glucose, Bld: 80 mg/dL (ref 70–99)
Potassium: 4.6 mmol/L (ref 3.5–5.1)
Sodium: 140 mmol/L (ref 135–145)
Total Bilirubin: 0.3 mg/dL (ref 0.3–1.2)
Total Protein: 5.2 g/dL — ABNORMAL LOW (ref 6.5–8.1)

## 2021-10-27 LAB — PHOSPHORUS: Phosphorus: 3.9 mg/dL (ref 2.5–4.6)

## 2021-10-27 LAB — MAGNESIUM: Magnesium: 1.5 mg/dL — ABNORMAL LOW (ref 1.7–2.4)

## 2021-10-27 MED ORDER — MAGNESIUM SULFATE 2 GM/50ML IV SOLN: 2.0000 g | Freq: Once | INTRAVENOUS | Status: DC

## 2021-10-27 MED ORDER — MAGNESIUM OXIDE -MG SUPPLEMENT 400 (240 MG) MG PO TABS
400.0000 mg | ORAL_TABLET | Freq: Two times a day (BID) | ORAL | Status: DC
  Administered 2021-10-27: 10:00:00 400 mg via ORAL
  Filled 2021-10-27: qty 1

## 2021-10-27 NOTE — TOC Transition Note (Signed)
Transition of Care [facility]) - CM/SW Discharge Note   Patient Details  Name: Cynthia [name] MRN: [id number] Date of Birth: May 20, 1961  [facility] CM/SW Contact:  Ross Ludwig, LCSW Phone Number: 10/27/2021, 12:24 PM   Clinical Narrative:     CSW was informed that patient did not have a ride home.  Per patient she has tried contacting people and no one is available to pick her.  CSW scheduled [facility] for patient transporting home.  CSW updated unit secretary that patient's ride will be here by 12:45pm and to make sure she is in the lobby, because they will only wait 5 minutes.  [place] weekday CSW provided patient with Medicaid Transportation paperwork to sign up for Medicaid transport in order for her to get to her appointments.  CSW signing off, please reconsult with other social work needs.   Final next level of care: Home/Self Care Barriers to Discharge: Transportation, Barriers Resolved   Patient Goals and CMS Choice Patient states their goals for this hospitalization and ongoing recovery are:: To return back home.      Discharge Placement                       Discharge Plan and Services In-house Referral: Clinical Social Work                                   Social Determinants of Health (SDOH) Interventions     Readmission Risk Interventions Readmission Risk Prevention Plan 08/20/2021 04/09/2021  Transportation Screening Complete Complete  Medication Review Press photographer) Complete Complete  PCP or Specialist appointment within 3-5 days of discharge Complete Complete  HRI or [place] Complete Complete  SW Recovery Care/Counseling Consult Complete Complete  Palliative Care Screening Not Applicable Not [place] Not Applicable Not Applicable  Some recent data might be hidden

## 2021-10-29 ENCOUNTER — Telehealth: Payer: Self-pay

## 2021-10-29 NOTE — Telephone Encounter (Signed)
Transition Care Management Unsuccessful Follow-up Telephone Call  Date of discharge and from where:  10/27/2021 Cynthia [name]  Attempts:  1st Attempt  Reason for unsuccessful TCM follow-up call:  Unable to leave message

## 2021-10-30 ENCOUNTER — Telehealth: Payer: Self-pay

## 2021-10-30 NOTE — Telephone Encounter (Signed)
Transition Care Management Unsuccessful Follow-up Telephone Call  Date of discharge and from where:  10/27/2021 [place]  Attempts:  2nd Attempt  Reason for unsuccessful TCM follow-up call:  Unable to leave message

## 2021-10-31 ENCOUNTER — Encounter (HOSPITAL_COMMUNITY): Payer: Self-pay

## 2021-10-31 ENCOUNTER — Emergency Department (HOSPITAL_COMMUNITY)
Admission: EM | Admit: 2021-10-31 | Discharge: 2021-11-01 | Disposition: A | Discharge status: Home / Self Care | Payer: Medicaid Other | Attending: Emergency Medicine | Admitting: Emergency Medicine

## 2021-10-31 ENCOUNTER — Other Ambulatory Visit: Payer: Self-pay

## 2021-10-31 DIAGNOSIS — Z87891 Personal history of nicotine dependence: Secondary | ICD-10-CM | POA: Insufficient documentation

## 2021-10-31 DIAGNOSIS — I10 Essential (primary) hypertension: Secondary | ICD-10-CM | POA: Insufficient documentation

## 2021-10-31 DIAGNOSIS — R002 Palpitations: Secondary | ICD-10-CM | POA: Insufficient documentation

## 2021-10-31 DIAGNOSIS — M7989 Other specified soft tissue disorders: Secondary | ICD-10-CM | POA: Diagnosis not present

## 2021-10-31 DIAGNOSIS — R600 Localized edema: Secondary | ICD-10-CM | POA: Diagnosis not present

## 2021-10-31 DIAGNOSIS — R609 Edema, unspecified: Secondary | ICD-10-CM

## 2021-10-31 DIAGNOSIS — K9419 Other complications of enterostomy: Secondary | ICD-10-CM

## 2021-10-31 DIAGNOSIS — K9409 Other complications of colostomy: Secondary | ICD-10-CM | POA: Insufficient documentation

## 2021-10-31 LAB — CBC WITH DIFFERENTIAL/PLATELET
Abs Immature Granulocytes: 0.02 10*3/uL (ref 0.00–0.07)
Basophils Absolute: 0 10*3/uL (ref 0.0–0.1)
Basophils Relative: 0 %
Eosinophils Absolute: 0.4 10*3/uL (ref 0.0–0.5)
Eosinophils Relative: 5 %
HCT: 29.9 % — ABNORMAL LOW (ref 36.0–46.0)
Hemoglobin: 9.2 g/dL — ABNORMAL LOW (ref 12.0–15.0)
Immature Granulocytes: 0 %
Lymphocytes Relative: 17 %
Lymphs Abs: 1.4 10*3/uL (ref 0.7–4.0)
MCH: 30.1 pg (ref 26.0–34.0)
MCHC: 30.8 g/dL (ref 30.0–36.0)
MCV: 97.7 fL (ref 80.0–100.0)
Monocytes Absolute: 0.5 10*3/uL (ref 0.1–1.0)
Monocytes Relative: 6 %
Neutro Abs: 5.6 10*3/uL (ref 1.7–7.7)
Neutrophils Relative %: 72 %
Platelets: 260 10*3/uL (ref 150–400)
RBC: 3.06 MIL/uL — ABNORMAL LOW (ref 3.87–5.11)
RDW: 13.6 % (ref 11.5–15.5)
WBC: 7.9 10*3/uL (ref 4.0–10.5)
nRBC: 0 % (ref 0.0–0.2)

## 2021-10-31 LAB — COMPREHENSIVE METABOLIC PANEL
ALT: 14 U/L (ref 0–44)
AST: 15 U/L (ref 15–41)
Albumin: 3.3 g/dL — ABNORMAL LOW (ref 3.5–5.0)
Alkaline Phosphatase: 51 U/L (ref 38–126)
Anion gap: 7 (ref 5–15)
BUN: 16 mg/dL (ref 8–23)
CO2: 20 mmol/L — ABNORMAL LOW (ref 22–32)
Calcium: 8.3 mg/dL — ABNORMAL LOW (ref 8.9–10.3)
Chloride: 111 mmol/L (ref 98–111)
Creatinine, Ser: 1.16 mg/dL — ABNORMAL HIGH (ref 0.44–1.00)
GFR, Estimated: 53 mL/min — ABNORMAL LOW (ref >60)
Glucose, Bld: 87 mg/dL (ref 70–99)
Potassium: 3.6 mmol/L (ref 3.5–5.1)
Sodium: 138 mmol/L (ref 135–145)
Total Bilirubin: 0.4 mg/dL (ref 0.3–1.2)
Total Protein: 6.4 g/dL — ABNORMAL LOW (ref 6.5–8.1)

## 2021-10-31 LAB — TROPONIN I (HIGH SENSITIVITY)
Troponin I (High Sensitivity): 6 ng/L (ref <18)
Troponin I (High Sensitivity): 7 ng/L (ref <18)

## 2021-10-31 LAB — MAGNESIUM: Magnesium: 1.6 mg/dL — ABNORMAL LOW (ref 1.7–2.4)

## 2021-10-31 LAB — BRAIN NATRIURETIC PEPTIDE: B Natriuretic Peptide: 369.2 pg/mL — ABNORMAL HIGH (ref 0.0–100.0)

## 2021-10-31 LAB — TSH: TSH: 4.409 u[IU]/mL (ref 0.350–4.500)

## 2021-10-31 NOTE — ED Triage Notes (Addendum)
Patient c/o intermittent "heart racing" and bilateral leg swelling x 2 days.  Patient states she was discharged on 10/27/21 for high output ileostomy.  Patient very tearful in triage. Patient's ostomy bag off and stool on her clothes and abdomen. Patient's stoma red and bulging.

## 2021-10-31 NOTE — ED Provider Notes (Signed)
Emergency Medicine Provider Triage Evaluation Note  Cynthia [name] , a 60 y.o. female  was evaluated in triage.  Pt complains of palpitations feeling her heart was racing and bilateral lower extremity swelling x2 days extending up to her knees.  Additionally patient has high output ileostomy secondary to Crohn's disease, recently admitted to the hospital for this.  Endorses significant edema and irritation of her ostomy.  Patient covered in stool in triage as her ostomy bag is fallen off, tearful. Review of Systems  Positive: Shortness of breath, palpitations, bilateral lower extremity edema, high output ostomy, swelling of her ostomy Negative: Chest pain syncope  Physical Exam  BP (!) 158/89 (BP Location: Right Arm)    Pulse (!) 105    Temp 98.9 F (37.2 C) (Oral)    Resp 18    Ht 5\' 4"  (1.626 m)    Wt 47.6 kg    LMP 06/10/2011 (Exact Date)    SpO2 100%    BMI 18.02 kg/m  Gen:   Awake, no distress   Resp:  Normal effort  MSK:   Moves extremities without difficulty  Other:  Tachycardic with regular rhythm.  Lungs CTA B.  Ostomy in the right abdomen, extremely edematous and erythematous with runny stool in ostomy bag that was placed by ED staff.  Extensive irritation and lichenification of the skin beneath the ostomy bag extending down towards her right lower quadrant. Medical Decision Making  Medically screening exam initiated at 1:50 PM.  Appropriate orders placed.  VERDIS BASSETTE was informed that the remainder of the evaluation will be completed by another provider, this initial triage assessment does not replace that evaluation, and the importance of remaining in the ED until their evaluation is complete.  This chart was dictated using voice recognition software, Dragon. Despite the best efforts of this provider to proofread and correct errors, errors may still occur which can change documentation meaning.    Emeline Darling, PA-C 10/31/21 1351    Pattricia Boss, MD 10/31/21  [phone]

## 2021-10-31 NOTE — ED Notes (Signed)
I attempted to collect the second Tropnin and was unsuccessful

## 2021-11-01 ENCOUNTER — Emergency Department (HOSPITAL_BASED_OUTPATIENT_CLINIC_OR_DEPARTMENT_OTHER)
Admit: 2021-11-01 | Discharge: 2021-11-01 | Disposition: A | Discharge status: Home / Self Care | Payer: Medicaid Other | Attending: Emergency Medicine | Admitting: Emergency Medicine

## 2021-11-01 DIAGNOSIS — M7989 Other specified soft tissue disorders: Secondary | ICD-10-CM

## 2021-11-01 LAB — URINALYSIS, ROUTINE W REFLEX MICROSCOPIC
Bilirubin Urine: NEGATIVE
Glucose, UA: NEGATIVE mg/dL
Hgb urine dipstick: NEGATIVE
Ketones, ur: NEGATIVE mg/dL
Nitrite: NEGATIVE
Protein, ur: NEGATIVE mg/dL
Specific Gravity, Urine: 1.011 (ref 1.005–1.030)
pH: 5 (ref 5.0–8.0)

## 2021-11-01 MED ORDER — DIAZEPAM 5 MG/ML IJ SOLN
2.5000 mg | Freq: Once | INTRAMUSCULAR | Status: AC
  Administered 2021-11-01: 10:00:00 2.5 mg via INTRAVENOUS
  Filled 2021-11-01: qty 2

## 2021-11-01 MED ORDER — HYDROMORPHONE HCL 1 MG/ML IJ SOLN
1.0000 mg | Freq: Once | INTRAMUSCULAR | Status: AC
  Administered 2021-11-01: 10:00:00 1 mg via INTRAVENOUS
  Filled 2021-11-01: qty 1

## 2021-11-01 MED ORDER — FENTANYL CITRATE PF 50 MCG/ML IJ SOSY
50.0000 ug | PREFILLED_SYRINGE | Freq: Once | INTRAMUSCULAR | Status: AC
  Administered 2021-11-01: 11:00:00 50 ug via INTRAVENOUS
  Filled 2021-11-01: qty 1

## 2021-11-01 MED ORDER — FUROSEMIDE 20 MG PO TABS: 20.0000 mg | ORAL_TABLET | Freq: Every day | ORAL | 0 refills | Status: DC

## 2021-11-01 NOTE — ED Provider Notes (Signed)
[name] EMERGENCY DEPARTMENT Provider Note  CSN: [id number] Arrival date & time: 10/31/21 1248    History Chief Complaint  Patient presents with   Tachycardia   Leg Swelling    Cynthia [name] is a 60 y.o. female with history of crohns disease is s/p ileostomy in March 2022 who has had several visits and admissions for high output with AKI. She was just discharged for same on 12/25. She reports in the last 2 days she has had episodes of racing heart and leg swelling. She has noticed that her ostomy is bulging which is new for her. She had been doing well from an output standpoint but it has become more volume in the last day or two. No fevers. No abdominal pain. No CP or SOB.    Past Medical History:  Diagnosis Date   Anemia 2012   Anxiety    Bronchitis    Crohn's ileitis ([place])    Depression    GERD (gastroesophageal reflux disease)    Headache(784.0)    MIGRAINE   Heart murmur    Hiatal hernia    Hypertension    IBS (irritable bowel syndrome)    Incontinence    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HCC)    MVP (mitral valve prolapse)    RLS (restless legs syndrome)    Tubular adenoma of colon 08/2019    Past Surgical History:  Procedure Laterality Date   ABDOMINAL HYSTERECTOMY  06/17/2011   Procedure: HYSTERECTOMY ABDOMINAL;  Surgeon: Arloa Koh;  Location: [facility];  Service: Gynecology;  Laterality: N/A;  Converted to Abdominal Hysterectomy with lysis of adhesions    ANTERIOR AND POSTERIOR REPAIR  02/25/2012   Procedure: ANTERIOR (CYSTOCELE) AND POSTERIOR REPAIR (RECTOCELE);  Surgeon: Daria Pastures, MD;  Location: [facility];  Service: Gynecology;  Laterality: N/A;  ANterior cystocele repair ONLY   BIOPSY  08/29/2019   Procedure: BIOPSY;  Surgeon: Lavena Bullion, DO;  Location: [facility];  Service: Gastroenterology;;   BLADDER SUSPENSION  02/25/2012   Procedure: TRANSVAGINAL TAPE (TVT) PROCEDURE;  Surgeon: Daria Pastures, MD;  Location: [facility];  Service:  Gynecology;  Laterality: N/A;   COLONOSCOPY WITH PROPOFOL N/A 08/29/2019   Procedure: COLONOSCOPY WITH PROPOFOL;  Surgeon: Lavena Bullion, DO;  Location: [facility];  Service: Gastroenterology;  Laterality: N/A;   CYSTOSCOPY  02/25/2012   Procedure: CYSTOSCOPY;  Surgeon: Daria Pastures, MD;  Location: [facility];  Service: Gynecology;  Laterality: N/A;   ILEOSTOMY     KNEE ARTHROSCOPY  2010   LAPAROSCOPIC ASSISTED VAGINAL HYSTERECTOMY  06/17/2011   Procedure: LAPAROSCOPIC ASSISTED VAGINAL HYSTERECTOMY;  Surgeon: Arloa Koh;  Location: [facility];  Service: Gynecology;  Laterality: N/A;  Attempted    POLYPECTOMY  08/29/2019   Procedure: POLYPECTOMY;  Surgeon: Lavena Bullion, DO;  Location: [facility];  Service: Gastroenterology;;   SALPINGOOPHORECTOMY  06/17/2011   Procedure: SALPINGO OOPHERECTOMY;  Surgeon: Arloa Koh;  Location: [facility];  Service: Gynecology;  Laterality: Bilateral;   TONSILLECTOMY      Family History  Problem Relation Age of Onset   Diabetes Mother    Dementia Mother    Clotting disorder Father    Macular degeneration Father    Crohn's disease Sister    Colon cancer Neg Hx    Esophageal cancer Neg Hx    Pancreatic cancer Neg Hx    Stomach cancer Neg Hx    Liver disease Neg Hx     Social History   Tobacco  Use   Smoking status: Former    Packs/day: 0.00    Types: Cigarettes   Smokeless tobacco: Never   Tobacco comments:    none in 2 weeks as of 12/04/20  Vaping Use   Vaping Use: Former  Substance Use Topics   Alcohol use: Not Currently   Drug use: Not Currently    Types: Cocaine, Marijuana    Comment: clean x 8 weeks     Home Medications Prior to Admission medications   Medication Sig Start Date End Date Taking? Authorizing Provider  furosemide (LASIX) 20 MG tablet Take 1 tablet (20 mg total) by mouth daily for 3 days. 11/01/21 11/04/21 Yes Truddie Hidden, MD  acetaminophen (TYLENOL) 500 MG tablet Take 1,000 mg by mouth every 6 (six)  hours as needed for headache or mild pain.    [provider]  cholestyramine (QUESTRAN) 4 g packet Take 1 packet (4 g total) by mouth 3 (three) times daily. 10/26/21 11/25/21  Elodia Florence., MD  diphenoxylate-atropine (LOMOTIL) 2.5-0.025 MG tablet Take 2 tablets by mouth 4 (four) times daily. 10/26/21 11/25/21  Elodia Florence., MD  ferrous sulfate 325 (65 FE) MG tablet Take 1 tablet (325 mg total) by mouth 2 (two) times daily with a meal. Patient not taking: Reported on 10/17/2021 09/29/21   Maczis, Barth Kirks, PA-C  loperamide (IMODIUM) 2 MG capsule Take 1 capsule (2 mg total) by mouth 3 (three) times daily. 10/26/21 11/25/21  Elodia Florence., MD  mesalamine (PENTASA) 250 MG CR capsule Take 4 capsules (1,000 mg total) by mouth 3 (three) times daily. 10/26/21 11/25/21  Elodia Florence., MD  Opium 10 MG/ML (1%) TINC Take 0.6 mLs (6 mg total) by mouth 4 (four) times daily for 7 days. 10/26/21 11/02/21  Elodia Florence., MD  polycarbophil (FIBERCON) 625 MG tablet Take 1 tablet (625 mg total) by mouth 2 (two) times daily. 09/29/21   Maczis, Barth Kirks, PA-C  venlafaxine XR (EFFEXOR-XR) 150 MG 24 hr capsule Take 150 mg by mouth daily with breakfast.    [provider]  vitamin B-12 (CYANOCOBALAMIN) 1000 MCG tablet Take 1 tablet (1,000 mcg total) by mouth daily. 10/26/21 11/25/21  Elodia Florence., MD  Zinc Oxide (TRIPLE PASTE) 12.8 % ointment Apply topically as needed for irritation. Patient not taking: Reported on 10/17/2021 09/29/21   Jillyn Ledger, PA-C     Allergies    Iodine   Review of Systems   Review of Systems A comprehensive review of systems was completed and negative except as noted in HPI.    Physical Exam BP (!) 142/86    Pulse 90    Temp 98.3 F (36.8 C)    Resp (!) 21    Ht 5' 4"  (1.626 m)    Wt 47.6 kg    LMP 06/10/2011 (Exact Date)    SpO2 100%    BMI 18.02 kg/m   Physical Exam Vitals and nursing note reviewed.   Constitutional:      Appearance: Normal appearance.  HENT:     Head: Normocephalic and atraumatic.     Nose: Nose normal.     Mouth/Throat:     Mouth: Mucous membranes are moist.  Eyes:     Extraocular Movements: Extraocular movements intact.     Conjunctiva/sclera: Conjunctivae normal.  Cardiovascular:     Rate and Rhythm: Normal rate.  Pulmonary:     Effort: Pulmonary effort is normal.  Breath sounds: Normal breath sounds.  Abdominal:     General: Abdomen is flat.     Palpations: Abdomen is soft.     Tenderness: There is no abdominal tenderness.     Comments: Ostomy in RLQ with prolapsed bowel, weeping blood tinged fluid, tender to palpation, see photo  Musculoskeletal:        General: Swelling present. Normal range of motion.     Cervical back: Neck supple.     Right lower leg: Edema (3+) present.     Left lower leg: Edema (3+) present.  Skin:    General: Skin is warm and dry.  Neurological:     General: No focal deficit present.     Mental Status: She is alert.  Psychiatric:        Mood and Affect: Mood normal.      ED Results / Procedures / Treatments   Labs (all labs ordered are listed, but only abnormal results are displayed) Labs Reviewed  COMPREHENSIVE METABOLIC PANEL - Abnormal; Notable for the following components:      Result Value   CO2 20 (*)    Creatinine, Ser 1.16 (*)    Calcium 8.3 (*)    Total Protein 6.4 (*)    Albumin 3.3 (*)    GFR, Estimated 53 (*)    All other components within normal limits  CBC WITH DIFFERENTIAL/PLATELET - Abnormal; Notable for the following components:   RBC 3.06 (*)    Hemoglobin 9.2 (*)    HCT 29.9 (*)    All other components within normal limits  MAGNESIUM - Abnormal; Notable for the following components:   Magnesium 1.6 (*)    All other components within normal limits  URINALYSIS, ROUTINE W REFLEX MICROSCOPIC - Abnormal; Notable for the following components:   Leukocytes,Ua MODERATE (*)    Bacteria, UA RARE  (*)    All other components within normal limits  BRAIN NATRIURETIC PEPTIDE - Abnormal; Notable for the following components:   B Natriuretic Peptide 369.2 (*)    All other components within normal limits  TSH  TROPONIN I (HIGH SENSITIVITY)  TROPONIN I (HIGH SENSITIVITY)    EKG EKG Interpretation  Date/Time:  Thursday October 31 2021 13:24:55 EST Ventricular Rate:  95 PR Interval:  152 QRS Duration: 91 QT Interval:  369 QTC Calculation: 464 R Axis:   -34 Text Interpretation: Sinus rhythm Left axis deviation Borderline low voltage, extremity leads No significant change since last tracing Confirmed by Calvert Cantor [phone]) on 11/01/2021 7:27:53 AM  Radiology VAS [place] LOWER EXTREMITY VENOUS (DVT) (7a-7p)  Result Date: 11/01/2021  Lower Venous DVT Study Patient Name:  CHRISTALYNN BOISE [facility]  Date of Exam:   11/01/2021 Medical Rec #: [id number]          Accession #:    [id number] Date of Birth: 01-02-1961         Patient Gender: F Patient Age:   65 years Exam Location:  [facility] Procedure:      VAS [place] LOWER EXTREMITY VENOUS (DVT) Referring Phys: Juanda Crumble Sie Formisano --------------------------------------------------------------------------------  Indications: Swelling.  Risk Factors: None identified. Limitations: Poor ultrasound/tissue interface. Comparison Study: No prior studies. Performing Technologist: Oliver Hum RVT  Examination Guidelines: A complete evaluation includes B-mode imaging, spectral Doppler, color Doppler, and power Doppler as needed of all accessible portions of each vessel. Bilateral testing is considered an integral part of a complete examination. Limited examinations for reoccurring indications may be performed as noted. The reflux portion of the  exam is performed with the patient in reverse Trendelenburg.  +---------+---------------+---------+-----------+----------+--------------+  RIGHT     Compressibility Phasicity Spontaneity Properties Thrombus Aging   +---------+---------------+---------+-----------+----------+--------------+  CFV       Full            Yes       Yes                                    +---------+---------------+---------+-----------+----------+--------------+  SFJ       Full                                                             +---------+---------------+---------+-----------+----------+--------------+  FV Prox   Full                                                             +---------+---------------+---------+-----------+----------+--------------+  FV Mid    Full                                                             +---------+---------------+---------+-----------+----------+--------------+  FV Distal Full                                                             +---------+---------------+---------+-----------+----------+--------------+  PFV       Full                                                             +---------+---------------+---------+-----------+----------+--------------+  POP       Full            Yes       Yes                                    +---------+---------------+---------+-----------+----------+--------------+  PTV       Full                                                             +---------+---------------+---------+-----------+----------+--------------+  PERO      Full                                                             +---------+---------------+---------+-----------+----------+--------------+   +---------+---------------+---------+-----------+----------+--------------+  LEFT      Compressibility Phasicity Spontaneity Properties Thrombus Aging  +---------+---------------+---------+-----------+----------+--------------+  CFV       Full            Yes       Yes                                    +---------+---------------+---------+-----------+----------+--------------+  SFJ       Full                                                              +---------+---------------+---------+-----------+----------+--------------+  FV Prox   Full                                                             +---------+---------------+---------+-----------+----------+--------------+  FV Mid    Full                                                             +---------+---------------+---------+-----------+----------+--------------+  FV Distal                 Yes       Yes                                    +---------+---------------+---------+-----------+----------+--------------+  PFV       Full                                                             +---------+---------------+---------+-----------+----------+--------------+  POP       Full            Yes       Yes                                    +---------+---------------+---------+-----------+----------+--------------+  PTV       Full                                                             +---------+---------------+---------+-----------+----------+--------------+  PERO      Full                                                             +---------+---------------+---------+-----------+----------+--------------+  Summary: RIGHT: - There is no evidence of deep vein thrombosis in the lower extremity. However, portions of this examination were limited- see technologist comments above.  - No cystic structure found in the popliteal fossa.  LEFT: - There is no evidence of deep vein thrombosis in the lower extremity. However, portions of this examination were limited- see technologist comments above.  - No cystic structure found in the popliteal fossa.  *See table(s) above for measurements and observations.    Preliminary     Procedures Procedures  Medications Ordered in the ED Medications  HYDROmorphone (DILAUDID) injection 1 mg (1 mg Intravenous Given 11/01/21 1005)  diazepam (VALIUM) injection 2.5 mg (2.5 mg Intravenous Given 11/01/21 1005)  fentaNYL (SUBLIMAZE) injection 50 mcg (50 mcg Intravenous  Given 11/01/21 1106)     MDM Rules/Calculators/A&P MDM  Patient with complex history of crohn's, high output ostomy with frequent visits for AKI, here today for racing heart and leg swelling. She is not particularly tachycardic on exam but she does have marked LE edema which is new (per patient and per recent admission notes). She also has an apparent prolapse of her bowel into her stoma. Will check doppler, labs done in triage and discuss stoma with General Surgery.  ED Course  I have reviewed the triage vital signs and the nursing notes.  Pertinent labs & imaging results that were available during my care of the patient were reviewed by me and considered in my medical decision making (see chart for details).  Clinical Course as of 11/01/21 1138  Fri Nov 01, 2021  0739 CBC with mild anemia, no leukocytosis. CMP is improved from previous with lower Cr and increased albumen/protein. BNP is not significantly elevated and Trop is neg.  [CS]  1610 Spoke with Barkley Boards, Gen Surg, who will come evaluate the patient's stoma. [CS]  [id number] Per vascular tech, no DVT on prelim interpretation.  [CS]  9604 Gen surgery unsuccessful with reduction of her prolapse. They will try again after pain meds.  [CS]  5409 Prolapse was successfully reduced by surgery and she reports her pain is improved. I will put her on a short course of lasix to help with leg swelling which is likely a combination of significant fluid resuscitation during recent admission and low albumin. Advised to follow up closely with her PCP for recheck BMP next week and to RTED for any other concerns.  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

## 2021-11-01 NOTE — Progress Notes (Addendum)
Patient ID: Cynthia [name], female   DOB: [birth year], 60 y.o.   MRN: [id number] [facility] Surgery Progress Note     Subjective: CC-  Patient returns to the ED complaining of lower extremity pain and swelling. She also reports that her ileostomy prolapsed about 3 days ago, was mild and now worse today. It is painful. It is productive. She is tolerating a diet without n/v and is hungry.  Objective: Vital signs in last 24 hours: Temp:  [98.3 F (36.8 C)-99 F (37.2 C)] 98.3 F (36.8 C) (12/30 0742) Pulse Rate:  [74-105] 90 (12/30 1100) Resp:  [11-28] 21 (12/30 1100) BP: (142-158)/(86-93) 142/86 (12/30 1100) SpO2:  [100 %] 100 % (12/30 1100) Weight:  [47.6 kg] 47.6 kg (12/30 0742)    Intake/Output from previous day: No intake/output data recorded. Intake/Output this shift: No intake/output data recorded.  PE: Gen:  Alert, NAD, pleasant Pulm: rate and effort normal on room air Abd: soft, nondistended, mild tenderness at stoma otherwise abdomen nontender, no peritonitis, ileostomy prolapsed, mucus fistula present without drainage Ext:  2+ pitting edema BLE Skin: no rashes noted, warm and dry  Lab Results:  Recent Labs    10/31/21 1404  WBC 7.9  HGB 9.2*  HCT 29.9*  PLT 260   BMET Recent Labs    10/31/21 1404  NA 138  K 3.6  CL 111  CO2 20*  GLUCOSE 87  BUN 16  CREATININE 1.16*  CALCIUM 8.3*   PT/INR No results for input(s): LABPROT, INR in the last 72 hours. CMP     Component Value Date/Time   NA 138 10/31/2021 1404   NA 136 07/19/2021 1324   K 3.6 10/31/2021 1404   CL 111 10/31/2021 1404   CO2 20 (L) 10/31/2021 1404   GLUCOSE 87 10/31/2021 1404   BUN 16 10/31/2021 1404   BUN 25 07/19/2021 1324   CREATININE 1.16 (H) 10/31/2021 1404   CREATININE 0.94 05/08/2016 0741   CALCIUM 8.3 (L) 10/31/2021 1404   PROT 6.4 (L) 10/31/2021 1404   PROT 5.5 (L) 07/19/2021 1324   ALBUMIN 3.3 (L) 10/31/2021 1404   ALBUMIN 3.4 (L) 07/19/2021 1324   AST 15  10/31/2021 1404   ALT 14 10/31/2021 1404   ALKPHOS 51 10/31/2021 1404   BILITOT 0.4 10/31/2021 1404   BILITOT <0.2 07/19/2021 1324   GFRNONAA 53 (L) 10/31/2021 1404   GFRAA 77 12/18/2020 1548   Lipase     Component Value Date/Time   LIPASE 73 (H) 09/14/2021 0525       Studies/Results: VAS [place] LOWER EXTREMITY VENOUS (DVT) (7a-7p)  Result Date: 11/01/2021  Lower Venous DVT Study Patient Name:  Cynthia [name] [facility]  Date of Exam:   11/01/2021 Medical Rec #: [id number]          Accession #:    [id number] Date of Birth: 1961/09/28         Patient Gender: F Patient Age:   66 years Exam Location:  [facility] Procedure:      VAS [place] LOWER EXTREMITY VENOUS (DVT) Referring Phys: Juanda Crumble SHELDON --------------------------------------------------------------------------------  Indications: Swelling.  Risk Factors: None identified. Limitations: Poor ultrasound/tissue interface. Comparison Study: No prior studies. Performing Technologist: Oliver Hum RVT  Examination Guidelines: A complete evaluation includes B-mode imaging, spectral Doppler, color Doppler, and power Doppler as needed of all accessible portions of each vessel. Bilateral testing is considered an integral part of a complete examination. Limited examinations for reoccurring indications may be performed as noted. The  reflux portion of the exam is performed with the patient in reverse Trendelenburg.  +---------+---------------+---------+-----------+----------+--------------+  RIGHT     Compressibility Phasicity Spontaneity Properties Thrombus Aging  +---------+---------------+---------+-----------+----------+--------------+  CFV       Full            Yes       Yes                                    +---------+---------------+---------+-----------+----------+--------------+  SFJ       Full                                                             +---------+---------------+---------+-----------+----------+--------------+  FV Prox   Full                                                              +---------+---------------+---------+-----------+----------+--------------+  FV Mid    Full                                                             +---------+---------------+---------+-----------+----------+--------------+  FV Distal Full                                                             +---------+---------------+---------+-----------+----------+--------------+  PFV       Full                                                             +---------+---------------+---------+-----------+----------+--------------+  POP       Full            Yes       Yes                                    +---------+---------------+---------+-----------+----------+--------------+  PTV       Full                                                             +---------+---------------+---------+-----------+----------+--------------+  PERO      Full                                                             +---------+---------------+---------+-----------+----------+--------------+   +---------+---------------+---------+-----------+----------+--------------+  LEFT      Compressibility Phasicity Spontaneity Properties Thrombus Aging  +---------+---------------+---------+-----------+----------+--------------+  CFV       Full            Yes       Yes                                    +---------+---------------+---------+-----------+----------+--------------+  SFJ       Full                                                             +---------+---------------+---------+-----------+----------+--------------+  FV Prox   Full                                                             +---------+---------------+---------+-----------+----------+--------------+  FV Mid    Full                                                             +---------+---------------+---------+-----------+----------+--------------+  FV Distal                 Yes       Yes                                     +---------+---------------+---------+-----------+----------+--------------+  PFV       Full                                                             +---------+---------------+---------+-----------+----------+--------------+  POP       Full            Yes       Yes                                    +---------+---------------+---------+-----------+----------+--------------+  PTV       Full                                                             +---------+---------------+---------+-----------+----------+--------------+  PERO      Full                                                             +---------+---------------+---------+-----------+----------+--------------+  Summary: RIGHT: - There is no evidence of deep vein thrombosis in the lower extremity. However, portions of this examination were limited- see technologist comments above.  - No cystic structure found in the popliteal fossa.  LEFT: - There is no evidence of deep vein thrombosis in the lower extremity. However, portions of this examination were limited- see technologist comments above.  - No cystic structure found in the popliteal fossa.  *See table(s) above for measurements and observations.    Preliminary     Anti-infectives: Anti-infectives (From admission, onward)    None        Assessment/Plan Hx of Crohn's s/p exploratory laparotomy, ileocolectomy with ileostomy, sigmoid colectomy with mucus fistula on 02/19/2021 by Dr. Hilliard Clark at [facility] applied to stoma and we were able to fully reduce. Wilson for diet. No indication for admission from surgical standpoint.  Lower extremity edema per EDP.    LOS: 0 days    Wellington Hampshire, [facility] Surgery 11/01/2021, 11:24 AM Please see Amion for pager number during day hours 7:00am-4:30pm

## 2021-11-01 NOTE — ED Notes (Signed)
Pt given second colostomy kit.

## 2021-11-01 NOTE — Progress Notes (Addendum)
°   11/01/21 0900  Clinical Encounter Type  Visited With Patient  Visit Type Initial;Psychological support;Spiritual support;Social support;ED  Referral From Nurse  Consult/Referral To Chaplain  Spiritual Encounters  Spiritual Needs Sacred text;Prayer;Ritual;Emotional;Grief support  Stress Factors  Patient Stress Factors Family relationships;Health changes;Loss;Major life changes  Family Stress Factors Family relationships;Lack of caregivers;Loss;Loss of control;Major life changes     Chaplain called for visiting patient - patient just was notified that her father at home died while she was in ED at [facility].  Extremely tearful and experiencing grief and shock.  She expressed remorse ad uilt that she was not with her father when he passed.  She stated she had just made a decision to come to [facility] ED as she was in pain and needed attending to - she "chose that rather than go to see my Dad- and now I'll never see him again."  Chaplain provided spiritual care and comfort and will remain avaiable through out day for additional spiritual support.    Respectfully submitted,  Rev. Regan Rakers Baldomero Lamy

## 2021-11-01 NOTE — Progress Notes (Signed)
Bilateral lower extremity venous duplex has been completed. Preliminary results can be found in CV Proc through chart review.  Results were given to Dr. Karle Starch.  11/01/21 8:51 AM Cynthia [name] RVT

## 2021-12-12 ENCOUNTER — Other Ambulatory Visit: Payer: Self-pay

## 2021-12-12 ENCOUNTER — Inpatient Hospital Stay (HOSPITAL_COMMUNITY)
Admission: EM | Admit: 2021-12-12 | Discharge: 2021-12-27 | DRG: 683 | Disposition: A | Discharge status: Home / Self Care | Payer: Medicaid Other | Attending: Family Medicine | Admitting: Family Medicine

## 2021-12-12 ENCOUNTER — Emergency Department (HOSPITAL_COMMUNITY): Payer: Medicaid Other

## 2021-12-12 ENCOUNTER — Encounter (HOSPITAL_COMMUNITY): Payer: Self-pay

## 2021-12-12 DIAGNOSIS — R109 Unspecified abdominal pain: Secondary | ICD-10-CM | POA: Diagnosis present

## 2021-12-12 DIAGNOSIS — K5000 Crohn's disease of small intestine without complications: Secondary | ICD-10-CM | POA: Diagnosis present

## 2021-12-12 DIAGNOSIS — N170 Acute kidney failure with tubular necrosis: Principal | ICD-10-CM | POA: Diagnosis present

## 2021-12-12 DIAGNOSIS — E440 Moderate protein-calorie malnutrition: Secondary | ICD-10-CM | POA: Insufficient documentation

## 2021-12-12 DIAGNOSIS — E871 Hypo-osmolality and hyponatremia: Secondary | ICD-10-CM | POA: Diagnosis present

## 2021-12-12 DIAGNOSIS — Z87891 Personal history of nicotine dependence: Secondary | ICD-10-CM

## 2021-12-12 DIAGNOSIS — Z832 Family history of diseases of the blood and blood-forming organs and certain disorders involving the immune mechanism: Secondary | ICD-10-CM

## 2021-12-12 DIAGNOSIS — Z833 Family history of diabetes mellitus: Secondary | ICD-10-CM

## 2021-12-12 DIAGNOSIS — I10 Essential (primary) hypertension: Secondary | ICD-10-CM | POA: Diagnosis present

## 2021-12-12 DIAGNOSIS — Z681 Body mass index (BMI) 19 or less, adult: Secondary | ICD-10-CM

## 2021-12-12 DIAGNOSIS — I959 Hypotension, unspecified: Secondary | ICD-10-CM | POA: Diagnosis present

## 2021-12-12 DIAGNOSIS — L308 Other specified dermatitis: Secondary | ICD-10-CM | POA: Diagnosis present

## 2021-12-12 DIAGNOSIS — R1031 Right lower quadrant pain: Secondary | ICD-10-CM

## 2021-12-12 DIAGNOSIS — Z9114 Patient's other noncompliance with medication regimen: Secondary | ICD-10-CM

## 2021-12-12 DIAGNOSIS — Z932 Ileostomy status: Secondary | ICD-10-CM

## 2021-12-12 DIAGNOSIS — N179 Acute kidney failure, unspecified: Secondary | ICD-10-CM | POA: Diagnosis present

## 2021-12-12 DIAGNOSIS — E8770 Fluid overload, unspecified: Secondary | ICD-10-CM

## 2021-12-12 DIAGNOSIS — E8809 Other disorders of plasma-protein metabolism, not elsewhere classified: Secondary | ICD-10-CM | POA: Diagnosis present

## 2021-12-12 DIAGNOSIS — D72829 Elevated white blood cell count, unspecified: Secondary | ICD-10-CM | POA: Diagnosis present

## 2021-12-12 DIAGNOSIS — Z20822 Contact with and (suspected) exposure to covid-19: Secondary | ICD-10-CM | POA: Diagnosis present

## 2021-12-12 DIAGNOSIS — K9419 Other complications of enterostomy: Secondary | ICD-10-CM | POA: Diagnosis present

## 2021-12-12 DIAGNOSIS — R600 Localized edema: Secondary | ICD-10-CM | POA: Diagnosis present

## 2021-12-12 DIAGNOSIS — D75838 Other thrombocytosis: Secondary | ICD-10-CM | POA: Diagnosis present

## 2021-12-12 DIAGNOSIS — K50112 Crohn's disease of large intestine with intestinal obstruction: Secondary | ICD-10-CM

## 2021-12-12 DIAGNOSIS — R198 Other specified symptoms and signs involving the digestive system and abdomen: Secondary | ICD-10-CM

## 2021-12-12 DIAGNOSIS — Y833 Surgical operation with formation of external stoma as the cause of abnormal reaction of the patient, or of later complication, without mention of misadventure at the time of the procedure: Secondary | ICD-10-CM | POA: Diagnosis present

## 2021-12-12 DIAGNOSIS — E162 Hypoglycemia, unspecified: Secondary | ICD-10-CM

## 2021-12-12 DIAGNOSIS — K219 Gastro-esophageal reflux disease without esophagitis: Secondary | ICD-10-CM | POA: Diagnosis present

## 2021-12-12 DIAGNOSIS — E538 Deficiency of other specified B group vitamins: Secondary | ICD-10-CM | POA: Diagnosis present

## 2021-12-12 DIAGNOSIS — E860 Dehydration: Secondary | ICD-10-CM | POA: Diagnosis present

## 2021-12-12 DIAGNOSIS — E872 Acidosis, unspecified: Secondary | ICD-10-CM | POA: Diagnosis present

## 2021-12-12 DIAGNOSIS — D649 Anemia, unspecified: Secondary | ICD-10-CM

## 2021-12-12 LAB — COMPREHENSIVE METABOLIC PANEL
ALT: 19 U/L (ref 0–44)
AST: 21 U/L (ref 15–41)
Albumin: 4.6 g/dL (ref 3.5–5.0)
Alkaline Phosphatase: 82 U/L (ref 38–126)
Anion gap: 12 (ref 5–15)
BUN: 78 mg/dL — ABNORMAL HIGH (ref 8–23)
CO2: 24 mmol/L (ref 22–32)
Calcium: 9.7 mg/dL (ref 8.9–10.3)
Chloride: 94 mmol/L — ABNORMAL LOW (ref 98–111)
Creatinine, Ser: 2.62 mg/dL — ABNORMAL HIGH (ref 0.44–1.00)
GFR, Estimated: 20 mL/min — ABNORMAL LOW (ref >60)
Glucose, Bld: 105 mg/dL — ABNORMAL HIGH (ref 70–99)
Potassium: 4.4 mmol/L (ref 3.5–5.1)
Sodium: 130 mmol/L — ABNORMAL LOW (ref 135–145)
Total Bilirubin: 0.6 mg/dL (ref 0.3–1.2)
Total Protein: 8.9 g/dL — ABNORMAL HIGH (ref 6.5–8.1)

## 2021-12-12 LAB — CBC WITH DIFFERENTIAL/PLATELET
Abs Immature Granulocytes: 0.06 10*3/uL (ref 0.00–0.07)
Basophils Absolute: 0 10*3/uL (ref 0.0–0.1)
Basophils Relative: 0 %
Eosinophils Absolute: 0.2 10*3/uL (ref 0.0–0.5)
Eosinophils Relative: 1 %
HCT: 45 % (ref 36.0–46.0)
Hemoglobin: 14.9 g/dL (ref 12.0–15.0)
Immature Granulocytes: 1 %
Lymphocytes Relative: 17 %
Lymphs Abs: 2 10*3/uL (ref 0.7–4.0)
MCH: 28.9 pg (ref 26.0–34.0)
MCHC: 33.1 g/dL (ref 30.0–36.0)
MCV: 87.4 fL (ref 80.0–100.0)
Monocytes Absolute: 0.5 10*3/uL (ref 0.1–1.0)
Monocytes Relative: 5 %
Neutro Abs: 8.9 10*3/uL — ABNORMAL HIGH (ref 1.7–7.7)
Neutrophils Relative %: 76 %
Platelets: 445 10*3/uL — ABNORMAL HIGH (ref 150–400)
RBC: 5.15 MIL/uL — ABNORMAL HIGH (ref 3.87–5.11)
RDW: 13.2 % (ref 11.5–15.5)
WBC: 11.7 10*3/uL — ABNORMAL HIGH (ref 4.0–10.5)
nRBC: 0 % (ref 0.0–0.2)

## 2021-12-12 LAB — LACTIC ACID, PLASMA: Lactic Acid, Venous: 1.6 mmol/L (ref 0.5–1.9)

## 2021-12-12 LAB — RESP PANEL BY RT-PCR (FLU A&B, COVID) ARPGX2
Influenza A by PCR: NEGATIVE
Influenza B by PCR: NEGATIVE
SARS Coronavirus 2 by RT PCR: NEGATIVE

## 2021-12-12 LAB — LIPASE, BLOOD: Lipase: 97 U/L — ABNORMAL HIGH (ref 11–51)

## 2021-12-12 MED ORDER — ONDANSETRON HCL 4 MG/2ML IJ SOLN
4.0000 mg | Freq: Once | INTRAMUSCULAR | Status: AC
  Administered 2021-12-12: 4 mg via INTRAVENOUS
  Filled 2021-12-12: qty 2

## 2021-12-12 MED ORDER — SODIUM CHLORIDE 0.9 % IV BOLUS
500.0000 mL | Freq: Once | INTRAVENOUS | Status: AC
  Administered 2021-12-12: 500 mL via INTRAVENOUS

## 2021-12-12 MED ORDER — SODIUM CHLORIDE 0.9 % IV BOLUS
500.0000 mL | Freq: Once | INTRAVENOUS | Status: AC
  Administered 2021-12-13: 500 mL via INTRAVENOUS

## 2021-12-12 MED ORDER — LIDOCAINE-EPINEPHRINE-TETRACAINE (LET) TOPICAL GEL
3.0000 mL | Freq: Once | TOPICAL | Status: AC
  Administered 2021-12-12: 3 mL via TOPICAL
  Filled 2021-12-12: qty 3

## 2021-12-12 MED ORDER — FENTANYL CITRATE PF 50 MCG/ML IJ SOSY
100.0000 ug | PREFILLED_SYRINGE | Freq: Once | INTRAMUSCULAR | Status: AC
  Administered 2021-12-12: 100 ug via INTRAVENOUS
  Filled 2021-12-12: qty 2

## 2021-12-12 NOTE — ED Notes (Signed)
Cleaned skin around ostomy and let it dry for some time, applied benzoin and allowed this to dry tacky.  Applied ostomy bag and held some gently pressure to help it adhere.  Gave pt warm blankets and washed the dried stool from her legs.  After cleaning skin on abdomen well and allowing it to dry I applied some barrier cream to soothe it as it was raw (sparing area around bag to avoid compromising seal) applied non adherent pads.  Pt still has some pain, EDP notified of pt pain and new medication order received.  Will offer pt some snacks as she is hungry. Will continue to monitor ostomy bag to see if we made a good seal.

## 2021-12-12 NOTE — ED Notes (Signed)
Pt has not made any stool since new bag was applied therefore I was not able to evaluate the seal yet.  Pt given noodle soup at this time

## 2021-12-12 NOTE — ED Provider Notes (Signed)
[facility] DEPT Provider Note   CSN: [id number] Arrival date & time: 12/12/21  1614     History  Chief Complaint  Patient presents with   Abdominal Pain   Stoma Prolapse     Cynthia [name] is a 61 y.o. female.  HPI She presents for evaluation of problems with her ostomy.  Typically ostomy has no output.  Today she started having output from the ostomy, and the bowel prolapse.  This caused the bag to fall off and irritate her abdominal wall.  She reports "stinging," abdominal pain.  She denies vomiting.  She took her usual medications this morning.    Home Medications Prior to Admission medications   Medication Sig Start Date End Date Taking? Authorizing Provider  acetaminophen (TYLENOL) 500 MG tablet Take 1,000 mg by mouth every 6 (six) hours as needed for headache or mild pain.    [provider]  cholestyramine (QUESTRAN) 4 g packet Take 1 packet (4 g total) by mouth 3 (three) times daily. 10/26/21 11/25/21  Elodia Florence., MD  ferrous sulfate 325 (65 FE) MG tablet Take 1 tablet (325 mg total) by mouth 2 (two) times daily with a meal. Patient not taking: Reported on 10/17/2021 09/29/21   Maczis, Barth Kirks, PA-C  furosemide (LASIX) 20 MG tablet Take 1 tablet (20 mg total) by mouth daily for 3 days. 11/01/21 11/04/21  Truddie Hidden, MD  mesalamine (PENTASA) 250 MG CR capsule Take 4 capsules (1,000 mg total) by mouth 3 (three) times daily. 10/26/21 11/25/21  Elodia Florence., MD  polycarbophil (FIBERCON) 625 MG tablet Take 1 tablet (625 mg total) by mouth 2 (two) times daily. 09/29/21   Maczis, Barth Kirks, PA-C  venlafaxine XR (EFFEXOR-XR) 150 MG 24 hr capsule Take 150 mg by mouth daily with breakfast.    [provider]  Zinc Oxide (TRIPLE PASTE) 12.8 % ointment Apply topically as needed for irritation. Patient not taking: Reported on 10/17/2021 09/29/21   Jillyn Ledger, PA-C      Allergies    Iodine     Review of Systems   Review of Systems  Physical Exam Updated Vital Signs BP 103/82    Pulse 100    Temp 97.7 F (36.5 C) (Oral)    Resp 16    LMP 06/10/2011 (Exact Date)    SpO2 98%  Physical Exam Vitals and nursing note reviewed.  Constitutional:      General: She is in acute distress (Uncomfortable).     Appearance: She is well-developed. She is not ill-appearing, toxic-appearing or diaphoretic.  HENT:     Head: Normocephalic and atraumatic.     Right Ear: External ear normal.     Left Ear: External ear normal.  Eyes:     Conjunctiva/sclera: Conjunctivae normal.     Pupils: Pupils are equal, round, and reactive to light.  Neck:     Trachea: Phonation normal.  Cardiovascular:     Rate and Rhythm: Regular rhythm. Tachycardia present.     Heart sounds: Normal heart sounds.  Pulmonary:     Effort: Pulmonary effort is normal. No respiratory distress.     Breath sounds: Normal breath sounds. No stridor.  Abdominal:     General: There is no distension.     Palpations: Abdomen is soft.     Tenderness: There is abdominal tenderness (Diffuse, moderate).     Comments: Ostomy, right mid lower quadrant, prolapsed about 3 and half centimeters, draining brown  stool.  Abdominal wall covered with feces, and appears to have reactive erythema.  Musculoskeletal:        General: Normal range of motion.     Cervical back: Normal range of motion and neck supple.  Skin:    General: Skin is warm and dry.  Neurological:     Mental Status: She is alert and oriented to person, place, and time.     Cranial Nerves: No cranial nerve deficit.     Sensory: No sensory deficit.     Motor: No abnormal muscle tone.     Coordination: Coordination normal.  Psychiatric:        Mood and Affect: Mood normal.        Behavior: Behavior normal.        Thought Content: Thought content normal.        Judgment: Judgment normal.    ED Results / Procedures / Treatments   Labs (all labs ordered are listed,  but only abnormal results are displayed) Labs Reviewed  COMPREHENSIVE METABOLIC PANEL - Abnormal; Notable for the following components:      Result Value   Sodium 130 (*)    Chloride 94 (*)    Glucose, Bld 105 (*)    BUN 78 (*)    Creatinine, Ser 2.62 (*)    Total Protein 8.9 (*)    GFR, Estimated 20 (*)    All other components within normal limits  LIPASE, BLOOD - Abnormal; Notable for the following components:   Lipase 97 (*)    All other components within normal limits  CBC WITH DIFFERENTIAL/PLATELET - Abnormal; Notable for the following components:   WBC 11.7 (*)    RBC 5.15 (*)    Platelets 445 (*)    Neutro Abs 8.9 (*)    All other components within normal limits  RESP PANEL BY RT-PCR (FLU A&B, COVID) ARPGX2  CULTURE, BLOOD (ROUTINE X 2)  CULTURE, BLOOD (ROUTINE X 2)  LACTIC ACID, PLASMA  LACTIC ACID, PLASMA    EKG EKG Interpretation  Date/Time:  Thursday December 12 2021 17:37:24 EST Ventricular Rate:  92 PR Interval:  143 QRS Duration: 93 QT Interval:  359 QTC Calculation: 445 R Axis:   -56 Text Interpretation: Sinus rhythm Right atrial enlargement LAD, consider left anterior fascicular block Probable left ventricular hypertrophy Anterior Q waves, possibly due to LVH ST elevation, consider inferior injury since last tracing no significant change Confirmed by Daleen Bo [id number]) on 12/12/2021 9:57:14 PM  Radiology CT ABDOMEN PELVIS WO CONTRAST  Result Date: 12/12/2021 CLINICAL DATA:  Right abdominal pain bowel obstruction. EXAM: CT ABDOMEN AND PELVIS WITHOUT CONTRAST TECHNIQUE: Multidetector CT imaging of the abdomen and pelvis was performed following the standard protocol without IV contrast. RADIATION DOSE REDUCTION: This exam was performed according to the departmental dose-optimization program which includes automated exposure control, adjustment of the mA and/or kV according to patient size and/or use of iterative reconstruction technique. COMPARISON:   10/24/2021 FINDINGS: Lower chest: No acute abnormality. Hepatobiliary: Cholelithiasis without pericholecystic inflammatory change identified. Liver unremarkable. No intra or extrahepatic biliary ductal dilation. Pancreas: Unremarkable Spleen: Unremarkable Adrenals/Urinary Tract: Adrenal glands are unremarkable. Kidneys are normal, without renal calculi, focal lesion, or hydronephrosis. Bladder is unremarkable. 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 The stomach, small bowel, and residual large bowel are unremarkable. No free intraperitoneal gas or fluid. No evidence of obstruction or  focal inflammation. Vascular/Lymphatic: Aortic atherosclerosis. No enlarged abdominal or pelvic lymph nodes. Reproductive: Status post hysterectomy. No adnexal masses. Other: No abdominal wall hernia. Musculoskeletal: Degenerative changes are seen within the lumbar spine. No lytic or blastic bone lesion. IMPRESSION: No acute intra-abdominal pathology identified. No definite radiographic explanation for the patient's reported symptoms. 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 Cholelithiasis. Aortic Atherosclerosis (ICD10-I70.0). Electronically Signed   By: Fidela Salisbury M.D.   On: 12/12/2021 19:11    Procedures Procedures    Medications Ordered in ED Medications  sodium chloride 0.9 % bolus 500 mL (0 mLs Intravenous Stopped 12/12/21 1754)  fentaNYL (SUBLIMAZE) injection 100 mcg (100 mcg Intravenous Given 12/12/21 1947)  ondansetron (ZOFRAN) injection 4 mg (4 mg Intravenous Given 12/12/21 1947)  lidocaine-EPINEPHrine-tetracaine (LET) topical gel (3 mLs Topical Given 12/12/21 1947)  fentaNYL (SUBLIMAZE) injection 100 mcg (100 mcg Intravenous Given 12/12/21 2125)    ED Course/ Medical Decision Making/ A&P                           Medical Decision  Making She presents with complications of her ostomy which was placed last year.  She has a ileostomy after having a small large bowel obstruction.  She had ileocolectomy, ileostomy, and sigmoid colectomy.  Problems Addressed: Right lower quadrant abdominal pain: undiagnosed new problem with uncertain prognosis  Amount and/or Complexity of Data Reviewed External Data Reviewed: labs, radiology and notes.    Details: Her operative notes and hospital notes, labs and x-ray.    Ms. Wandersee presented to [facility] on [facility]21 with abdominal pain, nausea, and vomiting and CT scan with findings compatible with small bowel obstruction. She was admitted for bowel rest, NG tube decompression, IV fluids, TPN with plans for surgery in a week. 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 She had delay in return of bowel function but otherwise had an uncomplicated postoperative course. NG tube was removed and diet was advanced as tolerated once bowel function returned. She is tolerating solid foods well. Pain is controlled. She is urinating without difficulty after removal of foley catheter. Her ileostomy is functioning well with appropriate output. Labs: ordered.    Details: CBC, metabolic panel, lactate, blood cultures-normal except sodium low, chloride low, glucose high, BUN high, creatinine high, total protein high, GFR low, lipase high, white count high, platelets Radiology: ordered and independent interpretation performed.    Details: CT abdomen pelvis-no complications, stable ileostomy and presence of cholelithiasis. ECG/medicine tests: ordered.    Details: Normal sinus rhythm on cardiac monitor.  EKG reviewed by me Discussion of management or test interpretation with external provider(s): Consult hospitalist to admit patient  Risk Prescription drug management. Decision regarding hospitalization. Risk Details: Patient presenting  with nonspecific abdominal pain and difficulty keeping her ostomy bag on her ileostomy.  Her abdominal wall is irritated from ongoing leakage of stool.  No evidence for abdominal wall cellulitis.  After multiple attempts to get ostomy bag to stay, it was decided to admit the patient for ongoing management.  She has a stable CT, indicating no acute problems or obstruction.  She has significant AKI, requiring treatment and observation in the hospital.  Doubt significant metabolic instability, acute bacterial infection or sepsis.              Final Clinical Impression(s) / ED Diagnoses Final diagnoses:  Right lower quadrant abdominal pain  AKI (acute kidney injury) ([place])  Rx / DC Orders ED Discharge Orders     None         Daleen Bo, MD 12/13/21 1312

## 2021-12-12 NOTE — ED Notes (Signed)
No second lactic drawn at this time as first was WNL

## 2021-12-12 NOTE — ED Notes (Signed)
Pt called RN to room due to ostomomy bag having some off.  Pt was holding bag and gown, sheets soiled.  Cleaned pt as gently as possible as her skin on her abdomen is raw and painful.  Pt is unable to tolerated proper cleaning and drying of skin to allow for new bag to be applied.  New orders for pain medication received and given and LET applied about ostomy to help pt tolerate proper cleaning and drying of broken down skin and application of benzoin to help new bag adhere.  At this time pt is holding bag in place, not taped to skin until LET has had a chance to work.  RN remains at the bedside.

## 2021-12-12 NOTE — ED Notes (Addendum)
Checked pt ostomy bag, she has made some stool and unfortunately it is already leaking (very minimally).  Non adherent pads applied to area to prevent further leaking until wound ostomy/floor RN can apply better suited materials that will hopefully hold, EDP was notified

## 2021-12-12 NOTE — ED Triage Notes (Signed)
EMS reports, While showering Pt noticed fluid drainage from Stoma, and prolapse. C/o pain at stoma site. Pt has history of prolapse in past.  BP 112/74 HR 111 RR 20 Sp02 98 RA

## 2021-12-13 ENCOUNTER — Encounter (HOSPITAL_COMMUNITY): Payer: Self-pay | Admitting: Internal Medicine

## 2021-12-13 DIAGNOSIS — E871 Hypo-osmolality and hyponatremia: Secondary | ICD-10-CM | POA: Diagnosis present

## 2021-12-13 DIAGNOSIS — Z833 Family history of diabetes mellitus: Secondary | ICD-10-CM | POA: Diagnosis not present

## 2021-12-13 DIAGNOSIS — E538 Deficiency of other specified B group vitamins: Secondary | ICD-10-CM | POA: Diagnosis present

## 2021-12-13 DIAGNOSIS — N170 Acute kidney failure with tubular necrosis: Secondary | ICD-10-CM | POA: Diagnosis present

## 2021-12-13 DIAGNOSIS — I959 Hypotension, unspecified: Secondary | ICD-10-CM | POA: Diagnosis present

## 2021-12-13 DIAGNOSIS — L308 Other specified dermatitis: Secondary | ICD-10-CM | POA: Diagnosis present

## 2021-12-13 DIAGNOSIS — E860 Dehydration: Secondary | ICD-10-CM | POA: Diagnosis present

## 2021-12-13 DIAGNOSIS — N179 Acute kidney failure, unspecified: Secondary | ICD-10-CM | POA: Diagnosis present

## 2021-12-13 DIAGNOSIS — E8770 Fluid overload, unspecified: Secondary | ICD-10-CM | POA: Diagnosis present

## 2021-12-13 DIAGNOSIS — E440 Moderate protein-calorie malnutrition: Secondary | ICD-10-CM | POA: Diagnosis present

## 2021-12-13 DIAGNOSIS — K9419 Other complications of enterostomy: Secondary | ICD-10-CM | POA: Diagnosis present

## 2021-12-13 DIAGNOSIS — R198 Other specified symptoms and signs involving the digestive system and abdomen: Secondary | ICD-10-CM | POA: Diagnosis not present

## 2021-12-13 DIAGNOSIS — Z20822 Contact with and (suspected) exposure to covid-19: Secondary | ICD-10-CM | POA: Diagnosis present

## 2021-12-13 DIAGNOSIS — D75838 Other thrombocytosis: Secondary | ICD-10-CM | POA: Diagnosis present

## 2021-12-13 DIAGNOSIS — E162 Hypoglycemia, unspecified: Secondary | ICD-10-CM | POA: Diagnosis not present

## 2021-12-13 DIAGNOSIS — Z932 Ileostomy status: Secondary | ICD-10-CM | POA: Diagnosis not present

## 2021-12-13 DIAGNOSIS — E8809 Other disorders of plasma-protein metabolism, not elsewhere classified: Secondary | ICD-10-CM | POA: Diagnosis present

## 2021-12-13 DIAGNOSIS — K219 Gastro-esophageal reflux disease without esophagitis: Secondary | ICD-10-CM | POA: Diagnosis present

## 2021-12-13 DIAGNOSIS — D649 Anemia, unspecified: Secondary | ICD-10-CM | POA: Diagnosis present

## 2021-12-13 DIAGNOSIS — R609 Edema, unspecified: Secondary | ICD-10-CM | POA: Diagnosis not present

## 2021-12-13 DIAGNOSIS — I10 Essential (primary) hypertension: Secondary | ICD-10-CM | POA: Diagnosis present

## 2021-12-13 DIAGNOSIS — Y833 Surgical operation with formation of external stoma as the cause of abnormal reaction of the patient, or of later complication, without mention of misadventure at the time of the procedure: Secondary | ICD-10-CM | POA: Diagnosis present

## 2021-12-13 DIAGNOSIS — Z87891 Personal history of nicotine dependence: Secondary | ICD-10-CM | POA: Diagnosis not present

## 2021-12-13 DIAGNOSIS — K5000 Crohn's disease of small intestine without complications: Secondary | ICD-10-CM | POA: Diagnosis present

## 2021-12-13 DIAGNOSIS — Z681 Body mass index (BMI) 19 or less, adult: Secondary | ICD-10-CM | POA: Diagnosis not present

## 2021-12-13 DIAGNOSIS — Z9114 Patient's other noncompliance with medication regimen: Secondary | ICD-10-CM | POA: Diagnosis not present

## 2021-12-13 DIAGNOSIS — Z832 Family history of diseases of the blood and blood-forming organs and certain disorders involving the immune mechanism: Secondary | ICD-10-CM | POA: Diagnosis not present

## 2021-12-13 DIAGNOSIS — R600 Localized edema: Secondary | ICD-10-CM | POA: Diagnosis present

## 2021-12-13 DIAGNOSIS — E872 Acidosis, unspecified: Secondary | ICD-10-CM | POA: Diagnosis present

## 2021-12-13 LAB — CBC WITH DIFFERENTIAL/PLATELET
Abs Immature Granulocytes: 0.07 10*3/uL (ref 0.00–0.07)
Basophils Absolute: 0 10*3/uL (ref 0.0–0.1)
Basophils Relative: 0 %
Eosinophils Absolute: 0.2 10*3/uL (ref 0.0–0.5)
Eosinophils Relative: 1 %
HCT: 39.5 % (ref 36.0–46.0)
Hemoglobin: 12.8 g/dL (ref 12.0–15.0)
Immature Granulocytes: 1 %
Lymphocytes Relative: 19 %
Lymphs Abs: 2.8 10*3/uL (ref 0.7–4.0)
MCH: 29.4 pg (ref 26.0–34.0)
MCHC: 32.4 g/dL (ref 30.0–36.0)
MCV: 90.6 fL (ref 80.0–100.0)
Monocytes Absolute: 0.9 10*3/uL (ref 0.1–1.0)
Monocytes Relative: 6 %
Neutro Abs: 10.9 10*3/uL — ABNORMAL HIGH (ref 1.7–7.7)
Neutrophils Relative %: 73 %
Platelets: 331 10*3/uL (ref 150–400)
RBC: 4.36 MIL/uL (ref 3.87–5.11)
RDW: 13.1 % (ref 11.5–15.5)
WBC: 15 10*3/uL — ABNORMAL HIGH (ref 4.0–10.5)
nRBC: 0 % (ref 0.0–0.2)

## 2021-12-13 LAB — URINALYSIS, COMPLETE (UACMP) WITH MICROSCOPIC
Bilirubin Urine: NEGATIVE
Glucose, UA: NEGATIVE mg/dL
Hgb urine dipstick: NEGATIVE
Ketones, ur: NEGATIVE mg/dL
Nitrite: NEGATIVE
Protein, ur: NEGATIVE mg/dL
Specific Gravity, Urine: 1.013 (ref 1.005–1.030)
pH: 5 (ref 5.0–8.0)

## 2021-12-13 LAB — CREATININE, URINE, RANDOM: Creatinine, Urine: 113.38 mg/dL

## 2021-12-13 LAB — COMPREHENSIVE METABOLIC PANEL
ALT: 14 U/L (ref 0–44)
AST: 16 U/L (ref 15–41)
Albumin: 3.7 g/dL (ref 3.5–5.0)
Alkaline Phosphatase: 64 U/L (ref 38–126)
Anion gap: 11 (ref 5–15)
BUN: 74 mg/dL — ABNORMAL HIGH (ref 8–23)
CO2: 19 mmol/L — ABNORMAL LOW (ref 22–32)
Calcium: 8.7 mg/dL — ABNORMAL LOW (ref 8.9–10.3)
Chloride: 100 mmol/L (ref 98–111)
Creatinine, Ser: 2.39 mg/dL — ABNORMAL HIGH (ref 0.44–1.00)
GFR, Estimated: 22 mL/min — ABNORMAL LOW (ref >60)
Glucose, Bld: 104 mg/dL — ABNORMAL HIGH (ref 70–99)
Potassium: 3.9 mmol/L (ref 3.5–5.1)
Sodium: 130 mmol/L — ABNORMAL LOW (ref 135–145)
Total Bilirubin: 0.1 mg/dL — ABNORMAL LOW (ref 0.3–1.2)
Total Protein: 7.1 g/dL (ref 6.5–8.1)

## 2021-12-13 LAB — MAGNESIUM: Magnesium: 2.1 mg/dL (ref 1.7–2.4)

## 2021-12-13 LAB — SODIUM, URINE, RANDOM: Sodium, Ur: <10 mmol/L

## 2021-12-13 LAB — HIV ANTIBODY (ROUTINE TESTING W REFLEX): HIV Screen 4th Generation wRfx: NONREACTIVE

## 2021-12-13 LAB — LACTIC ACID, PLASMA: Lactic Acid, Venous: 1.3 mmol/L (ref 0.5–1.9)

## 2021-12-13 MED ORDER — ACETAMINOPHEN 650 MG RE SUPP: 650.0000 mg | Freq: Four times a day (QID) | RECTAL | Status: DC | PRN

## 2021-12-13 MED ORDER — PANTOPRAZOLE SODIUM 40 MG IV SOLR
40.0000 mg | Freq: Once | INTRAVENOUS | Status: AC
  Administered 2021-12-13: 40 mg via INTRAVENOUS
  Filled 2021-12-13: qty 10

## 2021-12-13 MED ORDER — LOPERAMIDE HCL 2 MG PO CAPS
2.0000 mg | ORAL_CAPSULE | Freq: Three times a day (TID) | ORAL | Status: DC | PRN
  Administered 2021-12-13 – 2021-12-23 (×3): 2 mg via ORAL
  Filled 2021-12-13 (×3): qty 1

## 2021-12-13 MED ORDER — ACETAMINOPHEN 325 MG PO TABS
650.0000 mg | ORAL_TABLET | Freq: Four times a day (QID) | ORAL | Status: DC | PRN
  Administered 2021-12-13 – 2021-12-18 (×2): 650 mg via ORAL
  Filled 2021-12-13 (×2): qty 2

## 2021-12-13 MED ORDER — LACTATED RINGERS IV SOLN
INTRAVENOUS | Status: DC
  Administered 2021-12-14: 125 mL/h via INTRAVENOUS
  Administered 2021-12-15: 1000 mL via INTRAVENOUS

## 2021-12-13 MED ORDER — MORPHINE SULFATE (PF) 4 MG/ML IV SOLN
4.0000 mg | Freq: Once | INTRAVENOUS | Status: AC
  Administered 2021-12-13: 4 mg via INTRAVENOUS
  Filled 2021-12-13: qty 1

## 2021-12-13 MED ORDER — SODIUM CHLORIDE 0.9 % IV BOLUS: 500.0000 mL | Freq: Once | INTRAVENOUS | Status: DC

## 2021-12-13 MED ORDER — MORPHINE SULFATE (PF) 4 MG/ML IV SOLN
4.0000 mg | INTRAVENOUS | Status: DC | PRN
  Administered 2021-12-14 – 2021-12-19 (×5): 4 mg via INTRAVENOUS
  Filled 2021-12-13 (×7): qty 1

## 2021-12-13 MED ORDER — LACTATED RINGERS IV BOLUS
1000.0000 mL | Freq: Once | INTRAVENOUS | Status: AC
  Administered 2021-12-13: 1000 mL via INTRAVENOUS

## 2021-12-13 MED ORDER — LACTATED RINGERS IV SOLN: INTRAVENOUS | Status: AC

## 2021-12-13 MED ORDER — SODIUM CHLORIDE 0.9 % IV BOLUS
1000.0000 mL | Freq: Once | INTRAVENOUS | Status: AC
  Administered 2021-12-14: 1000 mL via INTRAVENOUS

## 2021-12-13 MED ORDER — ONDANSETRON HCL 4 MG/2ML IJ SOLN
4.0000 mg | Freq: Once | INTRAMUSCULAR | Status: AC
Start: 2021-12-13 — End: 2021-12-13
  Administered 2021-12-13: 4 mg via INTRAVENOUS
  Filled 2021-12-13: qty 2

## 2021-12-13 MED ORDER — ONDANSETRON HCL 4 MG/2ML IJ SOLN
4.0000 mg | Freq: Four times a day (QID) | INTRAMUSCULAR | Status: DC | PRN
  Administered 2021-12-22 – 2021-12-23 (×2): 4 mg via INTRAVENOUS
  Filled 2021-12-13 (×2): qty 2

## 2021-12-13 NOTE — ED Notes (Signed)
Transport called for transport to IP room.

## 2021-12-13 NOTE — ED Notes (Signed)
Transport arrived to take pt to IP room.

## 2021-12-13 NOTE — ED Notes (Signed)
Pt ready for transport up, await transport service.  Pt given apple sauce, saltines and water.  Pt reports that she feels better than earlier and was able to take a nap.

## 2021-12-13 NOTE — Progress Notes (Signed)
°  Transition of Care [facility]) Screening Note   Patient Details  Name: Cynthia [name] Date of Birth: 05/25/1961   Transition of Care [facility]) CM/SW Contact:    Lennart Pall, LCSW Phone Number: 12/13/2021, 11:20 AM    Transition of Care Department [facility]) has reviewed patient and no TOC needs have been identified at this time. We will continue to monitor patient advancement through interdisciplinary progression rounds. If new patient transition needs arise, please place a TOC consult.

## 2021-12-13 NOTE — Progress Notes (Signed)
[facility] Admit to the Day Admitter. I discussed this patient's case with the EDP, Dr. Eulis Foster.  Per these discussions:  This is a 61 year old female with colostomy, who is being admitted acute kidney injury in the setting of dehydration after presenting with leaking of her colostomy over the last few days resulting in erythema/irritation over the anterior aspect of the abdominal wall, reportedly most consistent with irritant dermatitis as opposed to cellulitis.  She also notes recent decline in oral intake.  Labs reveal acute kidney injury.  CT abdomen/pelvis showed no evidence of acute intra-abdominal or acute intrapelvic process.  I placed an order for observation to med telemetry for further evaluation management of AKI/dehydration.  I have also placed some preliminary admission orders via the adult multi morbid order set.  I have ordered continuous lactated Ringer's at 75 cc an hour x10 hours as well as urinalysis with microscopy in addition to random urine sodium as well as random urine creatinine.  An order has also been placed with wound care for assistance with care relating to the patient's colostomy given perpetual leakage around the ostomy.     Babs Bertin, DO Hospitalist

## 2021-12-13 NOTE — H&P (Signed)
History and Physical    Patient: Cynthia [name] QJJ:[id number] DOB: 01/22/1961 DOA: 12/12/2021 DOS: the patient was seen and examined on 12/13/2021 PCP: Denita Lung, MD  Patient coming from: Home  Chief Complaint:  Chief Complaint  Patient presents with   Abdominal Pain   Stoma Prolapse    HPI: Cynthia [name] is a 61 y.o. female with medical history significant of anxiety, depression, bronchitis, GERD, migraine headaches, hiatal hernia, mitral valve prolapse, hypertension, IBS, digital nonintrusive session, restless leg syndrome, partial bowel resection with colostomy who is coming to the emergency department with abdominal pain in stoma site, occasional nausea, lightheadedness, fatigue, frontal headache, dehydration in the setting of recent increase in colostomy output.  No fever, chills, night sweats, rhinorrhea, sore throat, productive cough, wheezing, dyspnea, hemoptysis, chest pain, palpitations, recent lower extremity edema, flank pain, dysuria, frequency or hematuria.  No polyuria, polydipsia, polyphagia or blurry vision.  ED course: Initial vital signs were temperature 97.9 F, pulse 96, respiration 18, BP 113/87 mmHg O2 sat 100% on room air.  The patient received a normal saline bolus in the emergency department.  Lab work: Her urinalysis was hazy with moderate leukocyte esterase and rare bacteria.  Lipase was 97 units/L.  CBC showed a white count 11.7, Emollin 14.9 g/dL platelets 445. CMP shows a sodium of 130 and chloride of 94 mmol/L.  Glucose on room 5, BUN 17 and creatinine 3.62 mg/dL.  Total protein was 8.9 g/dL.  Lactic acid was normal.  Imaging: CT abdomen/pelvis without contrast showed cholelithiasis and aortic atherosclerosis, below it was no acute intra-abdominal pathology identified.  Review of Systems: As mentioned in the history of present illness. All other systems reviewed and are negative.  Past Medical History:  Diagnosis Date   Anemia 2012   Anxiety     Bronchitis    Crohn's ileitis ([place])    Depression    GERD (gastroesophageal reflux disease)    Headache(784.0)    MIGRAINE   Heart murmur    Hiatal hernia    Hypertension    IBS (irritable bowel syndrome)    Incontinence    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HCC)    MVP (mitral valve prolapse)    RLS (restless legs syndrome)    Tubular adenoma of colon 08/2019   Past Surgical History:  Procedure Laterality Date   ABDOMINAL HYSTERECTOMY  06/17/2011   Procedure: HYSTERECTOMY ABDOMINAL;  Surgeon: Arloa Koh;  Location: [facility];  Service: Gynecology;  Laterality: N/A;  Converted to Abdominal Hysterectomy with lysis of adhesions    ANTERIOR AND POSTERIOR REPAIR  02/25/2012   Procedure: ANTERIOR (CYSTOCELE) AND POSTERIOR REPAIR (RECTOCELE);  Surgeon: Daria Pastures, MD;  Location: [facility];  Service: Gynecology;  Laterality: N/A;  ANterior cystocele repair ONLY   BIOPSY  08/29/2019   Procedure: BIOPSY;  Surgeon: Lavena Bullion, DO;  Location: [facility];  Service: Gastroenterology;;   BLADDER SUSPENSION  02/25/2012   Procedure: TRANSVAGINAL TAPE (TVT) PROCEDURE;  Surgeon: Daria Pastures, MD;  Location: [facility];  Service: Gynecology;  Laterality: N/A;   COLONOSCOPY WITH PROPOFOL N/A 08/29/2019   Procedure: COLONOSCOPY WITH PROPOFOL;  Surgeon: Lavena Bullion, DO;  Location: [facility];  Service: Gastroenterology;  Laterality: N/A;   CYSTOSCOPY  02/25/2012   Procedure: CYSTOSCOPY;  Surgeon: Daria Pastures, MD;  Location: [facility];  Service: Gynecology;  Laterality: N/A;   ILEOSTOMY     KNEE ARTHROSCOPY  2010   LAPAROSCOPIC ASSISTED VAGINAL HYSTERECTOMY  06/17/2011   Procedure:  LAPAROSCOPIC ASSISTED VAGINAL HYSTERECTOMY;  Surgeon: Arloa Koh;  Location: [facility];  Service: Gynecology;  Laterality: N/A;  Attempted    POLYPECTOMY  08/29/2019   Procedure: POLYPECTOMY;  Surgeon: Lavena Bullion, DO;  Location: [facility];  Service: Gastroenterology;;   SALPINGOOPHORECTOMY  06/17/2011    Procedure: SALPINGO OOPHERECTOMY;  Surgeon: Arloa Koh;  Location: [facility];  Service: Gynecology;  Laterality: Bilateral;   TONSILLECTOMY     Social History:  reports that she has quit smoking. Her smoking use included cigarettes. She has never used smokeless tobacco. She reports that she does not currently use alcohol. She reports that she does not currently use drugs after having used the following drugs: Cocaine and Marijuana.  Allergies  Allergen Reactions   Iodine Other (See Comments)    The patient said she had a reaction from iodine in some eye drops- caused eyes to turn red;  pt has no reactions to CT contrast    Family History  Problem Relation Age of Onset   Diabetes Mother    Dementia Mother    Clotting disorder Father    Macular degeneration Father    Crohn's disease Sister    Colon cancer Neg Hx    Esophageal cancer Neg Hx    Pancreatic cancer Neg Hx    Stomach cancer Neg Hx    Liver disease Neg Hx     Prior to Admission medications   Medication Sig Start Date End Date Taking? Authorizing Provider  ibuprofen (ADVIL) 200 MG tablet Take 400 mg by mouth every 6 (six) hours as needed for moderate pain.   Yes [provider]  acetaminophen (TYLENOL) 500 MG tablet Take 1,000 mg by mouth every 6 (six) hours as needed for headache or mild pain.    [provider]  cholestyramine (QUESTRAN) 4 g packet Take 1 packet (4 g total) by mouth 3 (three) times daily. 10/26/21 11/25/21  Elodia Florence., MD  ferrous sulfate 325 (65 FE) MG tablet Take 1 tablet (325 mg total) by mouth 2 (two) times daily with a meal. Patient not taking: Reported on 10/17/2021 09/29/21   Maczis, Barth Kirks, PA-C  furosemide (LASIX) 20 MG tablet Take 1 tablet (20 mg total) by mouth daily for 3 days. 11/01/21 11/04/21  Truddie Hidden, MD  mesalamine (PENTASA) 250 MG CR capsule Take 4 capsules (1,000 mg total) by mouth 3 (three) times daily. 10/26/21 11/25/21  Elodia Florence.,  MD  polycarbophil (FIBERCON) 625 MG tablet Take 1 tablet (625 mg total) by mouth 2 (two) times daily. Patient not taking: Reported on 12/13/2021 09/29/21   Jillyn Ledger, PA-C  Zinc Oxide (TRIPLE PASTE) 12.8 % ointment Apply topically as needed for irritation. Patient not taking: Reported on 10/17/2021 09/29/21   Jillyn Ledger, [place]    Physical Exam: Vitals:   12/13/21 0129 12/13/21 0130 12/13/21 0638 12/13/21 0925  BP:  103/70 103/72 107/69  Pulse:  97 84 84  Resp:  18 16 18   Temp:  97.8 F (36.6 C) (!) 97.5 F (36.4 C) 97.7 F (36.5 C)  TempSrc:  Oral Oral Oral  SpO2:  100% 100% 100%  Weight: 51.6 kg     Height: 5' 4"  (1.626 m)      Physical Exam Constitutional:      Appearance: She is normal weight.  HENT:     Head: Normocephalic and atraumatic.  Eyes:     Pupils: Pupils are equal, round, and reactive to  light.  Cardiovascular:     Rate and Rhythm: Normal rate and regular rhythm.  Pulmonary:     Effort: Pulmonary effort is normal.     Breath sounds: Normal breath sounds.  Abdominal:     General: Abdomen is flat. There is no distension.     Palpations: Abdomen is soft.     Tenderness: There is abdominal tenderness.     Comments: Tenderness and erythema around colostomy area.  Skin:    General: Skin is warm and dry.  Neurological:     General: No focal deficit present.     Mental Status: She is alert and oriented to person, place, and time.   Data Reviewed:  There are no new results to review at this time.  Assessment and Plan: Principal Problem:   AKI (acute kidney injury) ([place]) Due to dehydration secondary to   High output ileostomy ([place]) Admit to MedSurg/inpatient. LR bolus 1000 mL. Morphine as needed. Start loperamide 2 mg p.o. TID PRN. Avoid hypotension or nephrotoxins. Follow renal function electrolytes. Wound/ostomy care consult/measures appreciated.  Active Problems:   Leukocytosis No fever or chills. Monitor WBC.    Hyponatremia In  the setting of GI losses. Check urine sodium and osmolality . Continue IV fluids.    Gastroesophageal reflux disease Pantoprazole 40 mg IVP x1. Then pantoprazole 40 mg p.o. daily.     Benign essential HTN Monitor blood pressure.      Advance Care Planning:   Code Status: Full Code   Consults: Wound/ostomy care.  Family Communication:   Severity of Illness: The appropriate patient status for this patient is OBSERVATION. Observation status is judged to be reasonable and necessary in order to provide the required intensity of service to ensure the patient's safety. The patient's presenting symptoms, physical exam findings, and initial radiographic and laboratory data in the context of their medical condition is felt to place them at decreased risk for further clinical deterioration. Furthermore, it is anticipated that the patient will be medically stable for discharge from the hospital within 2 midnights of admission.   Author: Reubin Milan, MD 12/13/2021 11:15 AM  For on call review [email or web address].   This document was prepared using Par[employer]ic and may contain some unintended transcription errors.

## 2021-12-13 NOTE — ED Notes (Addendum)
ED TO INPATIENT HANDOFF REPORT  ED Nurse Name and Phone #: Orpah Cobb Name/Age/Gender Cynthia [name] 61 y.o. female Room/Bed: WA24/WA24  Code Status   Code Status: Full Code  Home/SNF/Other Home Patient oriented to: self, place, time, and situation Is this baseline? Yes   Triage Complete: Triage complete  Chief Complaint AKI (acute kidney injury) ([place]) [N17.9]  Triage Note EMS reports, While showering Pt noticed fluid drainage from Stoma, and prolapse. C/o pain at stoma site. Pt has history of prolapse in past.  BP 112/74 HR 111 RR 20 Sp02 98 RA      Allergies Allergies  Allergen Reactions   Iodine Other (See Comments)    The patient said she had a reaction from iodine in some eye drops- caused eyes to turn red;  pt has no reactions to CT contrast    Level of Care/Admitting Diagnosis ED Disposition     ED Disposition  Admit   Condition  --   [place]: [facility] [[id number]]  Level of Care: Med-Surg [16]  May place patient in observation at [facility] or [facility] if equivalent level of care is available:: No  Covid Evaluation: Confirmed COVID Negative  Diagnosis: AKI (acute kidney injury) [facility]) [[id number]]  Admitting Physician: Rhetta Mura [[id number]]  Attending Physician: Rhetta Mura [[id number]]          B Medical/Surgery History Past Medical History:  Diagnosis Date   Anemia 2012   Anxiety    Bronchitis    Crohn's ileitis ([place])    Depression    GERD (gastroesophageal reflux disease)    Headache(784.0)    MIGRAINE   Heart murmur    Hiatal hernia    Hypertension    IBS (irritable bowel syndrome)    Incontinence    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HCC)    MVP (mitral valve prolapse)    RLS (restless legs syndrome)    Tubular adenoma of colon 08/2019   Past Surgical History:  Procedure Laterality Date   ABDOMINAL HYSTERECTOMY  06/17/2011   Procedure: HYSTERECTOMY ABDOMINAL;  Surgeon: Arloa Koh;   Location: [facility];  Service: Gynecology;  Laterality: N/A;  Converted to Abdominal Hysterectomy with lysis of adhesions    ANTERIOR AND POSTERIOR REPAIR  02/25/2012   Procedure: ANTERIOR (CYSTOCELE) AND POSTERIOR REPAIR (RECTOCELE);  Surgeon: Daria Pastures, MD;  Location: [facility];  Service: Gynecology;  Laterality: N/A;  ANterior cystocele repair ONLY   BIOPSY  08/29/2019   Procedure: BIOPSY;  Surgeon: Lavena Bullion, DO;  Location: [facility];  Service: Gastroenterology;;   BLADDER SUSPENSION  02/25/2012   Procedure: TRANSVAGINAL TAPE (TVT) PROCEDURE;  Surgeon: Daria Pastures, MD;  Location: [facility];  Service: Gynecology;  Laterality: N/A;   COLONOSCOPY WITH PROPOFOL N/A 08/29/2019   Procedure: COLONOSCOPY WITH PROPOFOL;  Surgeon: Lavena Bullion, DO;  Location: [facility];  Service: Gastroenterology;  Laterality: N/A;   CYSTOSCOPY  02/25/2012   Procedure: CYSTOSCOPY;  Surgeon: Daria Pastures, MD;  Location: [facility];  Service: Gynecology;  Laterality: N/A;   ILEOSTOMY     KNEE ARTHROSCOPY  2010   LAPAROSCOPIC ASSISTED VAGINAL HYSTERECTOMY  06/17/2011   Procedure: LAPAROSCOPIC ASSISTED VAGINAL HYSTERECTOMY;  Surgeon: Arloa Koh;  Location: [facility];  Service: Gynecology;  Laterality: N/A;  Attempted    POLYPECTOMY  08/29/2019   Procedure: POLYPECTOMY;  Surgeon: Lavena Bullion, DO;  Location: [facility];  Service: Gastroenterology;;   SALPINGOOPHORECTOMY  06/17/2011  Procedure: SALPINGO OOPHERECTOMY;  Surgeon: Arloa Koh;  Location: [facility];  Service: Gynecology;  Laterality: Bilateral;   TONSILLECTOMY       A IV Location/Drains/Wounds Patient Lines/Drains/Airways Status     Active Line/Drains/Airways     Name Placement date Placement time Site Days   Ileostomy RLQ 09/28/21  0330  RLQ  76            Intake/Output Last 24 hours No intake or output data in the 24 hours ending 12/13/21 0018  Labs/Imaging Results for orders placed or performed during the  hospital encounter of 12/12/21 (from the past 48 hour(s))  Resp Panel by RT-PCR (Flu A&B, Covid) Peripheral     Status: None   Collection Time: 12/12/21  5:06 PM   Specimen: Peripheral; Nasopharyngeal(NP) swabs in vial transport medium  Result Value Ref Range   SARS Coronavirus 2 by RT PCR NEGATIVE NEGATIVE    Comment: (NOTE) SARS-CoV-2 target nucleic acids are NOT DETECTED.  The SARS-CoV-2 RNA is generally detectable in upper respiratory specimens during the acute phase of infection. The lowest concentration of SARS-CoV-2 viral copies this assay can detect is 138 copies/mL. A negative result does not preclude SARS-Cov-2 infection and should not be used as the sole basis for treatment or other patient management decisions. A negative result may occur with  improper specimen collection/handling, submission of specimen other than nasopharyngeal swab, presence of viral mutation(s) within the areas targeted by this assay, and inadequate number of viral copies(<138 copies/mL). A negative result must be combined with clinical observations, patient history, and epidemiological information. The expected result is Negative.  Fact Sheet for Patients:  EntrepreneurPulse.com.au  Fact Sheet for Healthcare Providers:  IncredibleEmployment.be  This test is no t yet approved or cleared by the [place] FDA and  has been authorized for detection and/or diagnosis of SARS-CoV-2 by FDA under an Emergency Use Authorization (EUA). This EUA will remain  in effect (meaning this test can be used) for the duration of the COVID-19 declaration under Section 564(b)(1) of the Act, 21 U.S.C.section 360bbb-3(b)(1), unless the authorization is terminated  or revoked sooner.       Influenza A by PCR NEGATIVE NEGATIVE   Influenza B by PCR NEGATIVE NEGATIVE    Comment: (NOTE) The Xpert Xpress SARS-CoV-2/FLU/RSV plus assay is intended as an aid in the diagnosis of  influenza from Nasopharyngeal swab specimens and should not be used as a sole basis for treatment. Nasal washings and aspirates are unacceptable for Xpert Xpress SARS-CoV-2/FLU/RSV testing.  Fact Sheet for Patients: EntrepreneurPulse.com.au  Fact Sheet for Healthcare Providers: IncredibleEmployment.be  This test is not yet approved or cleared by the [place] FDA and has been authorized for detection and/or diagnosis of SARS-CoV-2 by FDA under an Emergency Use Authorization (EUA). This EUA will remain in effect (meaning this test can be used) for the duration of the COVID-19 declaration under Section 564(b)(1) of the Act, 21 U.S.C. section 360bbb-3(b)(1), unless the authorization is terminated or revoked.  Performed at [facility], [place] [address], [place], [place] [address]   Comprehensive metabolic panel     Status: Abnormal   Collection Time: 12/12/21  5:12 PM  Result Value Ref Range   Sodium 130 (L) 135 - 145 mmol/L   Potassium 4.4 3.5 - 5.1 mmol/L   Chloride 94 (L) 98 - 111 mmol/L   CO2 24 22 - 32 mmol/L   Glucose, Bld 105 (H) 70 - 99 mg/dL    Comment: Glucose  reference range applies only to samples taken after fasting for at least 8 hours.   BUN 78 (H) 8 - 23 mg/dL   Creatinine, Ser 2.62 (H) 0.44 - 1.00 mg/dL   Calcium 9.7 8.9 - 10.3 mg/dL   Total Protein 8.9 (H) 6.5 - 8.1 g/dL   Albumin 4.6 3.5 - 5.0 g/dL   AST 21 15 - 41 U/L   ALT 19 0 - 44 U/L   Alkaline Phosphatase 82 38 - 126 U/L   Total Bilirubin 0.6 0.3 - 1.2 mg/dL   GFR, Estimated 20 (L) >60 mL/min    Comment: (NOTE) Calculated using the CKD-EPI Creatinine Equation (2021)    Anion gap 12 5 - 15    Comment: Performed at [facility], [place] [address], [place], [place] [address]  Lipase, blood     Status: Abnormal   Collection Time: 12/12/21  5:12 PM  Result Value Ref Range   Lipase 97 (H) 11 - 51 U/L    Comment: Performed at  [facility] [place] [address], [place], [place] [address]  CBC with Differential     Status: Abnormal   Collection Time: 12/12/21  5:12 PM  Result Value Ref Range   WBC 11.7 (H) 4.0 - 10.5 K/uL   RBC 5.15 (H) 3.87 - 5.11 MIL/uL   Hemoglobin 14.9 12.0 - 15.0 g/dL   HCT 45.0 36.0 - 46.0 %   MCV 87.4 80.0 - 100.0 fL   MCH 28.9 26.0 - 34.0 pg   MCHC 33.1 30.0 - 36.0 g/dL   RDW 13.2 11.5 - 15.5 %   Platelets 445 (H) 150 - 400 K/uL   nRBC 0.0 0.0 - 0.2 %   Neutrophils Relative % 76 %   Neutro Abs 8.9 (H) 1.7 - 7.7 K/uL   Lymphocytes Relative 17 %   Lymphs Abs 2.0 0.7 - 4.0 K/uL   Monocytes Relative 5 %   Monocytes Absolute 0.5 0.1 - 1.0 K/uL   Eosinophils Relative 1 %   Eosinophils Absolute 0.2 0.0 - 0.5 K/uL   Basophils Relative 0 %   Basophils Absolute 0.0 0.0 - 0.1 K/uL   Immature Granulocytes 1 %   Abs Immature Granulocytes 0.06 0.00 - 0.07 K/uL    Comment: Performed at [facility], [place] [address], [place], [place] [address]  Lactic acid, plasma     Status: None   Collection Time: 12/12/21  5:21 PM  Result Value Ref Range   Lactic Acid, Venous 1.6 0.5 - 1.9 mmol/L    Comment: Performed at [facility], [place] [address], [place], [place] [address]   CT ABDOMEN PELVIS WO CONTRAST  Result Date: 12/12/2021 CLINICAL DATA:  Right abdominal pain bowel obstruction. EXAM: CT ABDOMEN AND PELVIS WITHOUT CONTRAST TECHNIQUE: Multidetector CT imaging of the abdomen and pelvis was performed following the standard protocol without IV contrast. RADIATION DOSE REDUCTION: This exam was performed according to the departmental dose-optimization program which includes automated exposure control, adjustment of the mA and/or kV according to patient size and/or use of iterative reconstruction technique. COMPARISON:  10/24/2021 FINDINGS: Lower chest: No acute abnormality. Hepatobiliary: Cholelithiasis without pericholecystic inflammatory change identified.  Liver unremarkable. No intra or extrahepatic biliary ductal dilation. Pancreas: Unremarkable Spleen: Unremarkable Adrenals/Urinary Tract: Adrenal glands are unremarkable. Kidneys are normal, without renal calculi, focal lesion, or hydronephrosis. Bladder is unremarkable. Stomach/Bowel: Subtotal colectomy with staple lines noted within the mid ascending colon and mid sigmoid colon is identified with  right lower quadrant diverting ileostomy again identified. The stomach, small bowel, and residual large bowel are unremarkable. No free intraperitoneal gas or fluid. No evidence of obstruction or focal inflammation. Vascular/Lymphatic: Aortic atherosclerosis. No enlarged abdominal or pelvic lymph nodes. Reproductive: Status post hysterectomy. No adnexal masses. Other: No abdominal wall hernia. Musculoskeletal: Degenerative changes are seen within the lumbar spine. No lytic or blastic bone lesion. IMPRESSION: No acute intra-abdominal pathology identified. No definite radiographic explanation for the patient's reported symptoms. 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 Cholelithiasis. Aortic Atherosclerosis (ICD10-I70.0). Electronically Signed   By: Fidela Salisbury M.D.   On: 12/12/2021 19:11    Pending Labs Unresulted Labs (From admission, onward)     Start     Ordered   12/13/21 0500  HIV Antibody (routine testing w rflx)  (HIV Antibody (Routine testing w reflex) panel)  Tomorrow morning,   R        12/13/21 0002   12/13/21 0500  Magnesium  Tomorrow morning,   R       Question:  Specimen collection method  Answer:  Lab=Lab collect   12/13/21 0003   12/13/21 0500  Comprehensive metabolic panel  Tomorrow morning,   R       Question:  Specimen collection method  Answer:  Lab=Lab collect   12/13/21 0003   12/13/21 0500  CBC with Differential/Platelet  Tomorrow morning,   R       Question:  Specimen collection method  Answer:  Lab=Lab collect   12/13/21 0003   12/13/21 0003   Urinalysis, Complete w Microscopic  Once,   R        12/13/21 0003   12/13/21 0003  Sodium, urine, random  Add-on,   AD        12/13/21 0003   12/13/21 0003  Creatinine, urine, random  Add-on,   AD        12/13/21 0003   12/12/21 1644  Lactic acid, plasma  Now then every 2 hours,   STAT      12/12/21 1643   12/12/21 1644  Culture, blood (routine x 2)  BLOOD CULTURE X 2,   STAT      12/12/21 1643            Vitals/Pain Today's Vitals   12/12/21 2150 12/12/21 2200 12/12/21 2300 12/13/21 0000  BP:  103/82 101/75 93/72  Pulse:  100 88 85  Resp:   16   Temp:      TempSrc:      SpO2:  98% 99% 98%  PainSc: 4        Isolation Precautions No active isolations  Medications Medications  sodium chloride 0.9 % bolus 500 mL (has no administration in time range)  acetaminophen (TYLENOL) tablet 650 mg (has no administration in time range)    Or  acetaminophen (TYLENOL) suppository 650 mg (has no administration in time range)  lactated ringers infusion (has no administration in time range)  sodium chloride 0.9 % bolus 500 mL (0 mLs Intravenous Stopped 12/12/21 1754)  fentaNYL (SUBLIMAZE) injection 100 mcg (100 mcg Intravenous Given 12/12/21 1947)  ondansetron (ZOFRAN) injection 4 mg (4 mg Intravenous Given 12/12/21 1947)  lidocaine-EPINEPHrine-tetracaine (LET) topical gel (3 mLs Topical Given 12/12/21 1947)  fentaNYL (SUBLIMAZE) injection 100 mcg (100 mcg Intravenous Given 12/12/21 2125)    Mobility walks Low fall risk   Focused Assessments skin   R Recommendations: See Admitting Provider Note  Report given to:   Additional  Notes:   Ostomy bag leaking again... it was applied multiple times on first and I attempted to apply and unfortunately it was leaking again

## 2021-12-13 NOTE — ED Notes (Signed)
Checked with admitting MD regarding d/c telementry to allow pt to get a bed tonight.  Order was changed.  Will start new IV shortly, letting pt get some rest at this time.

## 2021-12-13 NOTE — Consult Note (Addendum)
[name] Nurse ostomy consult note Stoma type/location:  Pt is familiar to [name] team from several previous visits.  She had ileostomy surgery performed in 2022 and has been able to change her pouch at home without assistance and obtain pouches from a Medical supply store. She states her output became very liquid and high volume several days ago, and now her "skin is burned and painful and a pouch would not stay on." Stomal assessment/size: Stoma is red and viable, slightly above skin level, 1 1/4 inches. 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  Peristomal assessment: Peristomal skin is red, macerated, and painful to 2 inches surrounding stoma, as well as surrounding abdomen where previous pouches have leaked.   Treatment options for stomal/peristomal skin: Pt previously had [name] and creams applied to the peristomal skin in the ED, according to the progress notes, which contributed to the painful skin. Output: 200cc of liquid green stool in the pouch Ostomy pouching: 2pc.  Education provided:  Discussed pouching routines at home.  Patient has been cutting the opening too large, and has not been using a barrier ring or belt.  Demonstrated crusting skin for protection using skin prep wipes and stoma powder.  Pt watched the procedure and asked appropriate questions.  Applied barrier ring and 2 piece pouch with a high output spout. Reviewed use of a belt. Discussed importance of replacing oral fluids to avoid dehydration and following up with previous surgical team to discuss possible ostomy reversal.   Topical treatment orders provided for bedside nurses to perform as follows: Use Supplies: barrier ring, [name] # [phone], wafer [name], pouch [name] # [phone], or high output pouch [name] # [phone], ostomy powder, [name] # 6, belt [name] # [phone].  1. Empty pouch Q hour to avoid over-filling 2. If pouch is leaking please follow these steps to change the pouch:   Wash with water, pat dry.  Apply skin prep, then ostomy powder, then wipe with skin prep again to remove excess powder. Cut opening to 1 1/4 inches and apply barrier ring, then apply over stoma.  Attach ostomy belt and tape around edges to hold in place.  Apply extra powder to skin around pouch if it is irritated.   3. Cover left nonfunctioning stoma with 2X2 if desired. It does not have output and can remain open to air DO NOT USE: no creams around stoma, no Benzoin.  Please re-consult if further assistance is needed.  Thank-you,  [name] MSN, [name], [name], [name], [name]

## 2021-12-14 DIAGNOSIS — N179 Acute kidney failure, unspecified: Secondary | ICD-10-CM | POA: Diagnosis not present

## 2021-12-14 LAB — BASIC METABOLIC PANEL
Anion gap: 6 (ref 5–15)
BUN: 58 mg/dL — ABNORMAL HIGH (ref 8–23)
CO2: 20 mmol/L — ABNORMAL LOW (ref 22–32)
Calcium: 8.5 mg/dL — ABNORMAL LOW (ref 8.9–10.3)
Chloride: 108 mmol/L (ref 98–111)
Creatinine, Ser: 1.93 mg/dL — ABNORMAL HIGH (ref 0.44–1.00)
GFR, Estimated: 29 mL/min — ABNORMAL LOW (ref >60)
Glucose, Bld: 85 mg/dL (ref 70–99)
Potassium: 3.7 mmol/L (ref 3.5–5.1)
Sodium: 134 mmol/L — ABNORMAL LOW (ref 135–145)

## 2021-12-14 LAB — CBC
HCT: 33.2 % — ABNORMAL LOW (ref 36.0–46.0)
Hemoglobin: 10.6 g/dL — ABNORMAL LOW (ref 12.0–15.0)
MCH: 29.8 pg (ref 26.0–34.0)
MCHC: 31.9 g/dL (ref 30.0–36.0)
MCV: 93.3 fL (ref 80.0–100.0)
Platelets: 285 10*3/uL (ref 150–400)
RBC: 3.56 MIL/uL — ABNORMAL LOW (ref 3.87–5.11)
RDW: 13.2 % (ref 11.5–15.5)
WBC: 10.3 10*3/uL (ref 4.0–10.5)
nRBC: 0 % (ref 0.0–0.2)

## 2021-12-14 MED ORDER — SODIUM CHLORIDE 0.9 % IV BOLUS
1000.0000 mL | Freq: Once | INTRAVENOUS | Status: AC
  Administered 2021-12-14: 1000 mL via INTRAVENOUS

## 2021-12-14 MED ORDER — PSYLLIUM 95 % PO PACK
1.0000 | PACK | Freq: Every day | ORAL | Status: DC
  Administered 2021-12-14 – 2021-12-25 (×12): 1 via ORAL
  Filled 2021-12-14 (×12): qty 1

## 2021-12-14 MED ORDER — ENOXAPARIN SODIUM 40 MG/0.4ML IJ SOSY
40.0000 mg | PREFILLED_SYRINGE | Freq: Every day | INTRAMUSCULAR | Status: DC
  Administered 2021-12-14 – 2021-12-27 (×14): 40 mg via SUBCUTANEOUS
  Filled 2021-12-14 (×14): qty 0.4

## 2021-12-14 MED ORDER — GUAIFENESIN ER 600 MG PO TB12
600.0000 mg | ORAL_TABLET | Freq: Two times a day (BID) | ORAL | Status: DC | PRN
  Administered 2021-12-15: 600 mg via ORAL
  Filled 2021-12-14 (×2): qty 1

## 2021-12-14 NOTE — Assessment & Plan Note (Addendum)
Resume lasix at discharge

## 2021-12-14 NOTE — Progress Notes (Signed)
°   12/14/21 1400  Mobility  Activity Ambulated with assistance in hallway  Level of Assistance Standby assist, set-up cues, supervision of patient - no hands on  Assistive Device Other (Comment) (IV pole)  Distance Ambulated (ft) 450 ft  Activity Response Tolerated well  $Mobility charge 1 Mobility   Pt agreeable to mobilize this afternoon. Ambulated about 454ft in hall with IV pole, tolerated well. Pt noted 5/10 abdominal pain, otherwise No complaints. Left pt in bed, call bell at side. RN/NT notified of session.   [facility] Specialist Acute Rehab Services Office: [phone]

## 2021-12-14 NOTE — Assessment & Plan Note (Addendum)
Renal function is fluctuating Renal function stable today, ~1.4, has lower extremity edema, apparent overload Continue oral diuresis at discharge (should hold if increased ostomy output) Baseline creatinine appears to be ~1.1 (but fluctuates pretty widely looking back, many values higher than 1.1) Strict I/O, daily weights

## 2021-12-14 NOTE — Assessment & Plan Note (Addendum)
Improved, follow back on lasix

## 2021-12-14 NOTE — Progress Notes (Signed)
Rm 1303, Cynthia [name], 19. Has low BP 82/56 pulse 76.  Asymptomatic.  Has had 1000 cc bolus and still has cont. fluid at 125 cc/h

## 2021-12-14 NOTE — Progress Notes (Signed)
Rm Brandt, 60, wants some cough medicine.  Seems to be coughing tonight.

## 2021-12-14 NOTE — Assessment & Plan Note (Addendum)
Only unmeasured output since yesterday, improved overall Cholestyramine, metamucil, lomotil, imodium.  Now on octreotide. Seems to be doing better with octreotide.  She's had teaching regarding injections.  Will discharge with octreotide, imodium, lomotil, iron, fibercon, cholestyramine.  She should follow up with GI outpatient. Discussed pentasa with GI earlier in admission, will hold off given nonadherence  CT abdomen pelvis without acute intraabdominal pathology Unclear cause for increased output  Follow fecal lactoferrin - negative GI consult, appreciate assistance Surgery c/s, appreciate recs Ostomy nurse working on prism ostomy supplies Strict I/O, daily weights

## 2021-12-14 NOTE — Assessment & Plan Note (Signed)
PPI ?

## 2021-12-14 NOTE — Hospital Course (Addendum)
[name] is Cynthia [name] 61 y.o. female with medical history significant of anxiety, depression, bronchitis, GERD, migraine headaches, hiatal hernia, mitral valve prolapse, hypertension, IBS, digital nonintrusive session, restless leg syndrome, partial bowel resection with colostomy who is coming to the emergency department with abdominal pain in stoma site, occasional nausea, lightheadedness, fatigue, frontal headache, dehydration in the setting of recent increase in colostomy output.  Of note, she was discharged in December after admission from 12/15-12/24 with high output from her ostomy, but it doesn't sound like she filled many of her prescriptions at discharge?    Continued issues with high output ostomy and hypoglycemia.   Surgery and GI following.  See below for additional details

## 2021-12-14 NOTE — Progress Notes (Signed)
PROGRESS NOTE    Cynthia [name]  SWF:[id number] DOB: 1961/05/29 DOA: 12/12/2021 PCP: Denita Lung, MD  Chief Complaint  Patient presents with   Abdominal Pain   Stoma Prolapse     Brief Narrative:  Cynthia [name] is Cynthia [name] 61 y.o. female with medical history significant of anxiety, depression, bronchitis, GERD, migraine headaches, hiatal hernia, mitral valve prolapse, hypertension, IBS, digital nonintrusive session, restless leg syndrome, partial bowel resection with colostomy who is coming to the emergency department with abdominal pain in stoma site, occasional nausea, lightheadedness, fatigue, frontal headache, dehydration in the setting of recent increase in colostomy output.  Of note, she was discharged in December after admission from 12/15-12/24 with high output from her ostomy, but it doesn't sound like she filled many of her prescriptions at discharge?   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[facility]) Improving 550 recorded yesterday Add metamucil, has imodium prn Seems to be improving already today, but last admission was on tincture of opium, imodium, iron, fibercon, lomotil, and choleystramine (per her med rec, she doesn't appear to be taking these at this time -> discussed with her and pharmacy, sounds like she only was taking lomotil and imodium?) She was also started on mesalamine for concern for crohn's flare, but doesn't sound like she picked this up after discharge CT abdomen pelvis without acute intraabdominal pathology Unclear cause for increased output -> crohn's flare? Follow fecal lactoferrin - low suspicion for c  diff or other infectious diarrhea at this point, though will have low threshold to follow these studies GI consult Strict I/O, daily weights   Hypotension- (present on admission) Follow after IVF  Benign essential HTN- (present on admission) Hold antihypertensives (lasix)  Gastroesophageal reflux disease- (present on admission) PPI   DVT prophylaxis: lovenox Code Status: full Family Communication: none Disposition:   Status is: Inpatient Remains inpatient appropriate because: need for IVF, Gi c/s   Consultants:  gastroenterology  Procedures:  none  Antimicrobials:  Anti-infectives (From admission, onward)    None       Subjective: Thinks stool is  firming up  Objective: Vitals:   12/13/21 0925 12/13/21 1251 12/13/21 2115 12/14/21 0515  BP: 107/69 109/73 97/68 (!) 82/57  Pulse: 84 92 78 76  Resp: 18 14 16 16   Temp: 97.7 F (36.5 C) 97.9 F (36.6 C) 98.6 F (37 C) 97.8 F (36.6 C)  TempSrc: Oral Oral Oral Oral  SpO2: 100% 100% 98% 100%  Weight:      Height:        Intake/Output Summary (Last 24 hours) at 12/14/2021 1220 Last data filed at 12/14/2021 2202 Gross per 24 hour  Intake 4763.84 ml  Output 1060 ml  Net 3703.84 ml   Filed Weights   12/13/21 0129  Weight: 51.6 kg    Examination:  General exam: Appears calm and comfortable  Respiratory system: unlabored Cardiovascular system: RRR Gastrointestinal system: s/nt/nd, ostomy with loosely formed stool Central nervous system: Alert and oriented. No focal neurological deficits. Extremities: no LEE Skin: No rashes, lesions or ulcers Psychiatry: Judgement and insight appear normal. Mood & affect appropriate.  Data Reviewed: I have personally reviewed following labs and imaging studies  CBC: Recent Labs  Lab 12/12/21 1712 12/13/21 0423 12/14/21 0436  WBC 11.7* 15.0* 10.3  NEUTROABS 8.9* 10.9*  --   HGB 14.9 12.8 10.6*  HCT 45.0 39.5 33.2*  MCV 87.4 90.6 93.3  PLT 445* 331 285     Basic Metabolic Panel: Recent Labs  Lab 12/12/21 1712 12/13/21 0423 12/14/21 0436  NA 130* 130* 134*  K 4.4 3.9 3.7  CL 94* 100 108  CO2 24 19* 20*  GLUCOSE 105* 104* 85  BUN 78* 74* 58*  CREATININE 2.62* 2.39* 1.93*  CALCIUM 9.7 8.7* 8.5*  MG  --  2.1  --     GFR: Estimated Creatinine Clearance: 24.6 mL/min (Anasofia Micallef) (by C-G formula based on SCr of 1.93 mg/dL (H)).  Liver Function Tests: Recent Labs  Lab 12/12/21 1712 12/13/21 0423  AST 21 16  ALT 19 14  ALKPHOS 82 64  BILITOT 0.6 0.1*  PROT 8.9* 7.1  ALBUMIN 4.6 3.7    CBG: No results for input(s): GLUCAP in the last 168 hours.   Recent Results (from the past 240 hour(s))  Resp Panel by RT-PCR (Flu Berta Denson&B, Covid) Peripheral     Status: None   Collection Time: 12/12/21  5:06 PM   Specimen: Peripheral; Nasopharyngeal(NP) swabs in vial transport medium  Result Value Ref Range Status   SARS Coronavirus 2 by RT PCR NEGATIVE NEGATIVE Final    Comment: (NOTE) SARS-CoV-2 target nucleic acids are NOT DETECTED.  The SARS-CoV-2 RNA is generally detectable in upper respiratory specimens during the acute phase of infection. The lowest concentration of SARS-CoV-2 viral copies this assay can detect is 138 copies/mL. Teagon Kron negative result does not preclude SARS-Cov-2 infection and should not be used as the sole basis for treatment or other patient management decisions. Levi Klaiber negative result may occur with  improper specimen collection/handling, submission of specimen other than nasopharyngeal swab, presence of viral mutation(s) within the areas targeted by this assay, and inadequate number of viral copies(<138 copies/mL). Derika Eckles negative result must be combined with clinical observations, patient history, and epidemiological information. The expected result is Negative.  Fact Sheet for Patients:  EntrepreneurPulse.com.au  Fact Sheet for Healthcare Providers:  IncredibleEmployment.be  This test is  no t yet approved or cleared by the [place] FDA and  has been authorized for detection and/or diagnosis of SARS-CoV-2 by FDA under an Emergency Use Authorization (EUA). This EUA will remain  in effect (meaning this test can be used) for the duration of the COVID-19 declaration under Section 564(b)(1) of the Act, 21 U.S.C.section 360bbb-3(b)(1), unless the authorization is terminated  or revoked sooner.       Influenza Bartlomiej Jenkinson by PCR NEGATIVE NEGATIVE Final   Influenza B by PCR NEGATIVE NEGATIVE Final    Comment: (NOTE) The Xpert Xpress SARS-CoV-2/FLU/RSV plus assay is intended as an aid in the diagnosis of influenza from Nasopharyngeal swab specimens and should not be used as Robynn Marcel sole basis for treatment. Nasal washings and aspirates are unacceptable for Xpert Xpress SARS-CoV-2/FLU/RSV testing.  Fact Sheet for Patients: EntrepreneurPulse.com.au  Fact Sheet for Healthcare Providers: IncredibleEmployment.be  This test is not yet approved or cleared by the [place] FDA and has been authorized for detection and/or diagnosis of SARS-CoV-2 by FDA under an Emergency Use Authorization (EUA). This EUA will remain in effect (meaning this test can be used) for the duration of the COVID-19 declaration under Section 564(b)(1) of the Act, 21 U.S.C.  section 360bbb-3(b)(1), unless the authorization is terminated or revoked.  Performed at [facility], [place] [address], [place], [place] [address]   Culture, blood (routine x 2)     Status: None (Preliminary result)   Collection Time: 12/12/21  5:07 PM   Specimen: BLOOD  Result Value Ref Range Status   Specimen Description   Final    BLOOD LEFT ANTECUBITAL Performed at [facility] [address] [place], [place] [address]    Special Requests   Final    BOTTLES DRAWN AEROBIC AND ANAEROBIC Blood Culture adequate volume Performed at [facility] [address] [place], [place] [address]    Culture   Final    NO GROWTH 2 DAYS Performed at [facility] [address] [place], [place] [address]    Report Status PENDING  Incomplete  Culture, blood (routine x 2)     Status: None (Preliminary result)   Collection Time: 12/12/21  5:23 PM   Specimen: BLOOD  Result Value Ref Range Status   Specimen Description   Final    BLOOD RIGHT ANTECUBITAL Performed at [facility] [address], [place], [place] [address]    Special Requests   Final    BOTTLES DRAWN AEROBIC AND ANAEROBIC Blood Culture adequate volume Performed at [facility] [address], [place], [place] [address]    Culture   Final    NO GROWTH 2 DAYS Performed at [facility] [address], [place], [place] [address]    Report Status PENDING  Incomplete         Radiology Studies: CT ABDOMEN PELVIS WO CONTRAST  Result Date: 12/12/2021 CLINICAL DATA:  Right abdominal pain bowel obstruction. EXAM: CT ABDOMEN AND PELVIS WITHOUT CONTRAST TECHNIQUE: Multidetector CT imaging of the abdomen and pelvis was performed following the standard protocol without IV contrast. RADIATION DOSE REDUCTION: This exam was performed according to the departmental dose-optimization program which includes automated exposure control, adjustment of the mA and/or kV according to patient size and/or use of iterative reconstruction technique. COMPARISON:  10/24/2021 FINDINGS: Lower chest: No acute abnormality. Hepatobiliary: Cholelithiasis without pericholecystic inflammatory change identified. Liver unremarkable. No intra or extrahepatic biliary ductal dilation. Pancreas: Unremarkable Spleen: Unremarkable Adrenals/Urinary Tract: Adrenal glands are unremarkable. Kidneys are normal, without renal calculi, focal lesion, or hydronephrosis. Bladder is unremarkable. Stomach/Bowel: Subtotal colectomy with staple lines noted within the mid ascending  colon and mid sigmoid colon is identified with right lower quadrant diverting ileostomy again identified. The stomach, small bowel, and residual large bowel are unremarkable. No free intraperitoneal gas or fluid. No evidence of obstruction or focal inflammation. Vascular/Lymphatic: Aortic atherosclerosis. No enlarged abdominal or pelvic lymph nodes. Reproductive: Status post hysterectomy. No adnexal masses. Other: No abdominal wall hernia. Musculoskeletal: Degenerative changes are seen within the lumbar spine. No lytic or blastic bone lesion. IMPRESSION: No acute intra-abdominal pathology identified. No definite radiographic explanation for the patient's reported symptoms. 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 Cholelithiasis. Aortic Atherosclerosis (ICD10-I70.0). Electronically Signed   By: Fidela Salisbury M.D.   On: 12/12/2021 19:11        Scheduled Meds:  psyllium  1 packet Oral Daily   Continuous Infusions:  lactated ringers 125 mL/hr (12/14/21 0112)     LOS: 1 day    Time spent: over 30 min    Fayrene Helper, MD [facility]   To contact the attending provider between 7A-7P  or the covering provider during after hours 7P-7A, please log into the web site [email or web address] and access using universal [place] password for that web site. If you do not have the password, please call the hospital operator.  12/14/2021, 12:20 PM

## 2021-12-14 NOTE — Consult Note (Signed)
[facility] Consult  Referring Provider: Dr. Powell/[facility] hospitalist Primary Care Physician:  Denita Lung, MD Primary Gastroenterologist: [facility]/unassigned/dismissed from [facility] GI Reason for Consultation: Increased ostomy output  HPI: Cynthia [name] is a 61 y.o. female was admitted on 12/12/2021 with signs of dehydration-lightheadedness, fatigue, nausea due to increased ostomy output. Patient states that normally she would empty her ileostomy bag 3-4 times a day, however recently she has emptied it almost on an hourly basis. She has not noted any blood in ostomy or black stools. Stools have mostly been liquid, yellow, very rarely semiliquid. She denies abdominal pain but complains of leakage from around the ostomy site.  Patient states that because of her insurance, she has not been able to see a gastroenterologist in [facility], and was advised to see a gastroenterologist in [place], however has not been able to find a GI for the same.  Complicated past medical GI history Colonoscopy 08/2019, Dr. Bryan Lemma: Crohn's ileitis, tubular adenomas removed Exploratory laparotomy, ileocolectomy with ileostomy, sigmoid colectomy with mucous fistula on 02/19/2021  Patient has not been on any medications for Crohn's disease. She denies anal/rectal discharge. She has lost over 120 pounds in the last 2 years.   Past Medical History:  Diagnosis Date   Anemia 2012   Anxiety    Bronchitis    Crohn's ileitis ([place])    Depression    GERD (gastroesophageal reflux disease)    Headache(784.0)    MIGRAINE   Heart murmur    Hiatal hernia    Hypertension    IBS (irritable bowel syndrome)    Incontinence    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HCC)    MVP (mitral valve prolapse)    RLS (restless legs syndrome)    Tubular adenoma of colon 08/2019    Past Surgical History:  Procedure Laterality Date   ABDOMINAL HYSTERECTOMY  06/17/2011   Procedure: HYSTERECTOMY ABDOMINAL;   Surgeon: Arloa Koh;  Location: [facility];  Service: Gynecology;  Laterality: N/A;  Converted to Abdominal Hysterectomy with lysis of adhesions    ANTERIOR AND POSTERIOR REPAIR  02/25/2012   Procedure: ANTERIOR (CYSTOCELE) AND POSTERIOR REPAIR (RECTOCELE);  Surgeon: Daria Pastures, MD;  Location: [facility];  Service: Gynecology;  Laterality: N/A;  ANterior cystocele repair ONLY   BIOPSY  08/29/2019   Procedure: BIOPSY;  Surgeon: Lavena Bullion, DO;  Location: [facility];  Service: Gastroenterology;;   BLADDER SUSPENSION  02/25/2012   Procedure: TRANSVAGINAL TAPE (TVT) PROCEDURE;  Surgeon: Daria Pastures, MD;  Location: [facility];  Service: Gynecology;  Laterality: N/A;   COLONOSCOPY WITH PROPOFOL N/A 08/29/2019   Procedure: COLONOSCOPY WITH PROPOFOL;  Surgeon: Lavena Bullion, DO;  Location: [place];  Service: Gastroenterology;  Laterality: N/A;   CYSTOSCOPY  02/25/2012   Procedure: CYSTOSCOPY;  Surgeon: Daria Pastures, MD;  Location: [facility];  Service: Gynecology;  Laterality: N/A;   ILEOSTOMY     KNEE ARTHROSCOPY  2010   LAPAROSCOPIC ASSISTED VAGINAL HYSTERECTOMY  06/17/2011   Procedure: LAPAROSCOPIC ASSISTED VAGINAL HYSTERECTOMY;  Surgeon: Arloa Koh;  Location: [facility];  Service: Gynecology;  Laterality: N/A;  Attempted    POLYPECTOMY  08/29/2019   Procedure: POLYPECTOMY;  Surgeon: Lavena Bullion, DO;  Location: [facility];  Service: Gastroenterology;;   SALPINGOOPHORECTOMY  06/17/2011   Procedure: SALPINGO OOPHERECTOMY;  Surgeon: Arloa Koh;  Location: [facility];  Service: Gynecology;  Laterality: Bilateral;   TONSILLECTOMY      Prior to Admission medications   Medication Sig  Start Date End Date Taking? Authorizing Provider  ibuprofen (ADVIL) 200 MG tablet Take 400 mg by mouth every 6 (six) hours as needed for moderate pain.   Yes [provider]  acetaminophen (TYLENOL) 500 MG tablet Take 1,000 mg by mouth every 6 (six) hours as needed for headache or  mild pain.    [provider]  cholestyramine (QUESTRAN) 4 g packet Take 1 packet (4 g total) by mouth 3 (three) times daily. 10/26/21 11/25/21  Elodia Florence., MD  ferrous sulfate 325 (65 FE) MG tablet Take 1 tablet (325 mg total) by mouth 2 (two) times daily with a meal. Patient not taking: Reported on 10/17/2021 09/29/21   Maczis, Barth Kirks, PA-C  furosemide (LASIX) 20 MG tablet Take 1 tablet (20 mg total) by mouth daily for 3 days. 11/01/21 11/04/21  Truddie Hidden, MD  mesalamine (PENTASA) 250 MG CR capsule Take 4 capsules (1,000 mg total) by mouth 3 (three) times daily. 10/26/21 11/25/21  Elodia Florence., MD  polycarbophil (FIBERCON) 625 MG tablet Take 1 tablet (625 mg total) by mouth 2 (two) times daily. Patient not taking: Reported on 12/13/2021 09/29/21   Jillyn Ledger, PA-C  Zinc Oxide (TRIPLE PASTE) 12.8 % ointment Apply topically as needed for irritation. Patient not taking: Reported on 10/17/2021 09/29/21   Jillyn Ledger, PA-C    Current Facility-Administered Medications  Medication Dose Route Frequency Provider Last Rate Last Admin   acetaminophen (TYLENOL) tablet 650 mg  650 mg Oral Q6H PRN Howerter, Justin B, DO   650 mg at 12/13/21 0254   Or   acetaminophen (TYLENOL) suppository 650 mg  650 mg Rectal Q6H PRN Howerter, Justin B, DO       enoxaparin (LOVENOX) injection 40 mg  40 mg Subcutaneous Daily Elodia Florence., MD   40 mg at 12/14/21 1232   lactated ringers infusion   Intravenous Continuous Reubin Milan, MD 125 mL/hr at 12/14/21 1233 New Bag at 12/14/21 1233   loperamide (IMODIUM) capsule 2 mg  2 mg Oral TID PRN Reubin Milan, MD   2 mg at 12/13/21 2346   morphine (PF) 4 MG/ML injection 4 mg  4 mg Intravenous Q3H PRN Lovey Newcomer T, NP       ondansetron (ZOFRAN) injection 4 mg  4 mg Intravenous Q6H PRN Reubin Milan, MD       psyllium (HYDROCIL/METAMUCIL) 1 packet  1 packet Oral Daily Elodia Florence., MD   1  packet at 12/14/21 2706    Allergies as of 12/12/2021 - Review Complete 12/12/2021  Allergen Reaction Noted   Iodine Other (See Comments) 03/17/2008    Family History  Problem Relation Age of Onset   Diabetes Mother    Dementia Mother    Clotting disorder Father    Macular degeneration Father    Crohn's disease Sister    Colon cancer Neg Hx    Esophageal cancer Neg Hx    Pancreatic cancer Neg Hx    Stomach cancer Neg Hx    Liver disease Neg Hx     Social History   Socioeconomic History   Marital status: Widowed    Spouse name: Not on file   Number of children: 1   Years of education: Not on file   Highest education level: Not on file  Occupational History   Occupation: Designer, television/film set: [employer]  Tobacco Use   Smoking status: Former  Packs/day: 0.00    Types: Cigarettes   Smokeless tobacco: Never   Tobacco comments:    none in 2 weeks as of 12/04/20  Vaping Use   Vaping Use: Former  Substance and Sexual Activity   Alcohol use: Not Currently   Drug use: Not Currently    Types: Cocaine, Marijuana    Comment: clean x 8 weeks   Sexual activity: Not Currently  Other Topics Concern   Not on file  Social History Narrative   Lives with boyfriend who she states is an alcoholic   Social Determinants of Radio broadcast assistant Strain: Not on file  Food Insecurity: Not on file  Transportation Needs: Not on file  Physical Activity: Not on file  Stress: Not on file  Social Connections: Not on file  Intimate Partner Violence: Not on file    Review of Systems: Positive for: GI: Described in detail in HPI.    Gen: involuntary weight loss, denies any fever, chills, rigors, night sweats, anorexia, fatigue, weakness, malaise, and sleep disorder CV: Denies chest pain, angina, palpitations, syncope, orthopnea, PND, peripheral edema, and claudication. Resp: Denies dyspnea, cough, sputum, wheezing, coughing up blood. GU : Denies urinary burning, blood in  urine, urinary frequency, urinary hesitancy, nocturnal urination, and urinary incontinence. MS: Denies joint pain or swelling.  Denies muscle weakness, cramps, atrophy.  Derm: Denies rash, itching, oral ulcerations, hives, unhealing ulcers.  Psych: Denies depression, anxiety, memory loss, suicidal ideation, hallucinations,  and confusion. Heme: Denies bruising, bleeding, and enlarged lymph nodes. Neuro:   dizziness, denies any headaches, paresthesias. Endo:  Denies any problems with DM, thyroid, adrenal function.  Physical Exam: Vital signs in last 24 hours: Temp:  [97.7 F (36.5 C)-98.6 F (37 C)] 97.7 F (36.5 C) (02/11 1229) Pulse Rate:  [76-80] 80 (02/11 1229) Resp:  [16] 16 (02/11 1229) BP: (82-113)/(57-73) 113/73 (02/11 1229) SpO2:  [98 %-100 %] 100 % (02/11 1229) Last BM Date: 12/13/21  General:   Alert,  Well-developed, well-nourished, pleasant and cooperative in NAD Head:  Normocephalic and atraumatic. Eyes:  Sclera clear, no icterus.   Conjunctiva pink. Ears:  Normal auditory acuity. Nose:  No deformity, discharge,  or lesions. Mouth:  No deformity or lesions.  Oropharynx pink & moist. Neck:  Supple; no masses or thyromegaly. Lungs:  Clear throughout to auscultation.   No wheezes, crackles, or rhonchi. No acute distress. Heart:  Regular rate and rhythm; no murmurs, clicks, rubs,  or gallops. Extremities:  Without clubbing or edema. Neurologic:  Alert and  oriented x4;  grossly normal neurologically. Skin:  Intact without significant lesions or rashes. Psych:  Alert and cooperative. Normal mood and affect. Abdomen: Soft, nontender Sigmoid mucous fistula-unremarkable Ileostomy-liquid yellow stool Surrounding skin-erythematous, irritated    Lab Results: Recent Labs    12/12/21 1712 12/13/21 0423 12/14/21 0436  WBC 11.7* 15.0* 10.3  HGB 14.9 12.8 10.6*  HCT 45.0 39.5 33.2*  PLT 445* 331 285   BMET Recent Labs    12/12/21 1712 12/13/21 0423 12/14/21 0436   NA 130* 130* 134*  K 4.4 3.9 3.7  CL 94* 100 108  CO2 24 19* 20*  GLUCOSE 105* 104* 85  BUN 78* 74* 58*  CREATININE 2.62* 2.39* 1.93*  CALCIUM 9.7 8.7* 8.5*   LFT Recent Labs    12/13/21 0423  PROT 7.1  ALBUMIN 3.7  AST 16  ALT 14  ALKPHOS 64  BILITOT 0.1*   PT/INR No results for input(s): LABPROT, INR in the  last 72 hours.  Studies/Results: CT ABDOMEN PELVIS WO CONTRAST  Result Date: 12/12/2021 CLINICAL DATA:  Right abdominal pain bowel obstruction. EXAM: CT ABDOMEN AND PELVIS WITHOUT CONTRAST TECHNIQUE: Multidetector CT imaging of the abdomen and pelvis was performed following the standard protocol without IV contrast. RADIATION DOSE REDUCTION: This exam was performed according to the departmental dose-optimization program which includes automated exposure control, adjustment of the mA and/or kV according to patient size and/or use of iterative reconstruction technique. COMPARISON:  10/24/2021 FINDINGS: Lower chest: No acute abnormality. Hepatobiliary: Cholelithiasis without pericholecystic inflammatory change identified. Liver unremarkable. No intra or extrahepatic biliary ductal dilation. Pancreas: Unremarkable Spleen: Unremarkable Adrenals/Urinary Tract: Adrenal glands are unremarkable. Kidneys are normal, without renal calculi, focal lesion, or hydronephrosis. Bladder is unremarkable. 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 The stomach, small bowel, and residual large bowel are unremarkable. No free intraperitoneal gas or fluid. No evidence of obstruction or focal inflammation. Vascular/Lymphatic: Aortic atherosclerosis. No enlarged abdominal or pelvic lymph nodes. Reproductive: Status post hysterectomy. No adnexal masses. Other: No abdominal wall hernia. Musculoskeletal: Degenerative changes are seen within the lumbar spine. No lytic or  blastic bone lesion. IMPRESSION: No acute intra-abdominal pathology identified. No definite radiographic explanation for the patient's reported symptoms. 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 Cholelithiasis. Aortic Atherosclerosis (ICD10-I70.0). Electronically Signed   By: Fidela Salisbury M.D.   On: 12/12/2021 19:11    Impression: High ostomy output, impaired renal function Admitted with a BUN of 78, creatinine 2.62 and GFR of 20 Has improved to 58/1.93/29  History of Crohn's disease, status post subtotal colectomy, diverting ileostomy, sigmoid mucous fistula formation  Noncompliance with medications  Mild acidosis Normocytic anemia Reactive thrombocytosis, platelet 445, improved to 285  U tox positive for cocaine and opioids on 09/28/2019  Plan: Tolerating regular diet On lactated Ringer's at 125 cc an hour, continue the same, monitor renal function and ostomy output-patient reports decreased ostomy output Currently on loperamide 2 mg 3 times a day as needed and Metamucil 1 packet daily, continue the same If needed can consider using cholestyramine. Recommend ostomy nurse consult Patient states she has to buy ostomy bag without insurance coverage and does not seem to be adhering to proper ostomy care.    LOS: 1 day   Ronnette Juniper, MD  12/14/2021, 1:06 PM

## 2021-12-15 DIAGNOSIS — N179 Acute kidney failure, unspecified: Secondary | ICD-10-CM | POA: Diagnosis not present

## 2021-12-15 LAB — BASIC METABOLIC PANEL
Anion gap: 6 (ref 5–15)
BUN: 35 mg/dL — ABNORMAL HIGH (ref 8–23)
CO2: 18 mmol/L — ABNORMAL LOW (ref 22–32)
Calcium: 8.4 mg/dL — ABNORMAL LOW (ref 8.9–10.3)
Chloride: 111 mmol/L (ref 98–111)
Creatinine, Ser: 1.44 mg/dL — ABNORMAL HIGH (ref 0.44–1.00)
GFR, Estimated: 41 mL/min — ABNORMAL LOW (ref >60)
Glucose, Bld: 72 mg/dL (ref 70–99)
Potassium: 4 mmol/L (ref 3.5–5.1)
Sodium: 135 mmol/L (ref 135–145)

## 2021-12-15 LAB — HEPATIC FUNCTION PANEL
ALT: 13 U/L (ref 0–44)
AST: 14 U/L — ABNORMAL LOW (ref 15–41)
Albumin: 3 g/dL — ABNORMAL LOW (ref 3.5–5.0)
Alkaline Phosphatase: 46 U/L (ref 38–126)
Bilirubin, Direct: <0.1 mg/dL (ref 0.0–0.2)
Total Bilirubin: 0.1 mg/dL — ABNORMAL LOW (ref 0.3–1.2)
Total Protein: 5.7 g/dL — ABNORMAL LOW (ref 6.5–8.1)

## 2021-12-15 LAB — CBC
HCT: 31.4 % — ABNORMAL LOW (ref 36.0–46.0)
Hemoglobin: 9.6 g/dL — ABNORMAL LOW (ref 12.0–15.0)
MCH: 29 pg (ref 26.0–34.0)
MCHC: 30.6 g/dL (ref 30.0–36.0)
MCV: 94.9 fL (ref 80.0–100.0)
Platelets: 269 10*3/uL (ref 150–400)
RBC: 3.31 MIL/uL — ABNORMAL LOW (ref 3.87–5.11)
RDW: 13.2 % (ref 11.5–15.5)
WBC: 9 10*3/uL (ref 4.0–10.5)
nRBC: 0 % (ref 0.0–0.2)

## 2021-12-15 LAB — LACTOFERRIN, FECAL, QUALITATIVE: Lactoferrin, Fecal, Qual: NEGATIVE

## 2021-12-15 MED ORDER — CHOLESTYRAMINE LIGHT 4 G PO PACK
4.0000 g | PACK | Freq: Three times a day (TID) | ORAL | Status: DC
  Administered 2021-12-15 – 2021-12-27 (×36): 4 g via ORAL
  Filled 2021-12-15 (×38): qty 1

## 2021-12-15 MED ORDER — ALUM & MAG HYDROXIDE-SIMETH 200-200-20 MG/5ML PO SUSP
30.0000 mL | ORAL | Status: DC | PRN
  Administered 2021-12-15: 30 mL via ORAL
  Filled 2021-12-15: qty 30

## 2021-12-15 NOTE — Progress Notes (Signed)
Subjective: Patient reports improvement in ileostomy output.  Objective: Vital signs in last 24 hours: Temp:  [97.7 F (36.5 C)-98.6 F (37 C)] 98.1 F (36.7 C) (02/12 0606) Pulse Rate:  [80-84] 84 (02/12 0606) Resp:  [16-18] 18 (02/12 0606) BP: (94-113)/(55-73) 101/67 (02/12 0606) SpO2:  [100 %] 100 % (02/12 0606) Weight change:  Last BM Date: 12/13/21  PE: Not in distress GENERAL: Mild pallor  ABDOMEN: Ileostomy output-liquid yellow, normal-appearing sigmoid mucous fistula, soft, nontender EXTREMITIES: No deformity  Lab Results: Results for orders placed or performed during the hospital encounter of 12/12/21 (from the past 48 hour(s))  CBC     Status: Abnormal   Collection Time: 12/14/21  4:36 AM  Result Value Ref Range   WBC 10.3 4.0 - 10.5 K/uL   RBC 3.56 (L) 3.87 - 5.11 MIL/uL   Hemoglobin 10.6 (L) 12.0 - 15.0 g/dL   HCT 33.2 (L) 36.0 - 46.0 %   MCV 93.3 80.0 - 100.0 fL   MCH 29.8 26.0 - 34.0 pg   MCHC 31.9 30.0 - 36.0 g/dL   RDW 13.2 11.5 - 15.5 %   Platelets 285 150 - 400 K/uL   nRBC 0.0 0.0 - 0.2 %    Comment: Performed at [facility], [place] [address] [place], [place] [address]  Basic metabolic panel     Status: Abnormal   Collection Time: 12/14/21  4:36 AM  Result Value Ref Range   Sodium 134 (L) 135 - 145 mmol/L   Potassium 3.7 3.5 - 5.1 mmol/L   Chloride 108 98 - 111 mmol/L   CO2 20 (L) 22 - 32 mmol/L   Glucose, Bld 85 70 - 99 mg/dL    Comment: Glucose reference range applies only to samples taken after fasting for at least 8 hours.   BUN 58 (H) 8 - 23 mg/dL   Creatinine, Ser 1.93 (H) 0.44 - 1.00 mg/dL   Calcium 8.5 (L) 8.9 - 10.3 mg/dL   GFR, Estimated 29 (L) >60 mL/min    Comment: (NOTE) Calculated using the CKD-EPI Creatinine Equation (2021)    Anion gap 6 5 - 15    Comment: Performed at [facility], [place] [address], [place], [place] [address]  CBC     Status: Abnormal   Collection Time: 12/15/21  4:36 AM   Result Value Ref Range   WBC 9.0 4.0 - 10.5 K/uL   RBC 3.31 (L) 3.87 - 5.11 MIL/uL   Hemoglobin 9.6 (L) 12.0 - 15.0 g/dL   HCT 31.4 (L) 36.0 - 46.0 %   MCV 94.9 80.0 - 100.0 fL   MCH 29.0 26.0 - 34.0 pg   MCHC 30.6 30.0 - 36.0 g/dL   RDW 13.2 11.5 - 15.5 %   Platelets 269 150 - 400 K/uL   nRBC 0.0 0.0 - 0.2 %    Comment: Performed at [facility], [place] [address], [place], [place] [address]  Basic metabolic panel     Status: Abnormal   Collection Time: 12/15/21  4:36 AM  Result Value Ref Range   Sodium 135 135 - 145 mmol/L   Potassium 4.0 3.5 - 5.1 mmol/L   Chloride 111 98 - 111 mmol/L   CO2 18 (L) 22 - 32 mmol/L   Glucose, Bld 72 70 - 99 mg/dL    Comment: Glucose reference range applies only to samples taken after fasting for at least 8 hours.   BUN 35 (H) 8 - 23 mg/dL  Creatinine, Ser 1.44 (H) 0.44 - 1.00 mg/dL   Calcium 8.4 (L) 8.9 - 10.3 mg/dL   GFR, Estimated 41 (L) >60 mL/min    Comment: (NOTE) Calculated using the CKD-EPI Creatinine Equation (2021)    Anion gap 6 5 - 15    Comment: Performed at [facility], [place] [address] [place], [place] [address]  Hepatic function panel     Status: Abnormal   Collection Time: 12/15/21  4:36 AM  Result Value Ref Range   Total Protein 5.7 (L) 6.5 - 8.1 g/dL   Albumin 3.0 (L) 3.5 - 5.0 g/dL   AST 14 (L) 15 - 41 U/L   ALT 13 0 - 44 U/L   Alkaline Phosphatase 46 38 - 126 U/L   Total Bilirubin 0.1 (L) 0.3 - 1.2 mg/dL   Bilirubin, Direct <0.1 0.0 - 0.2 mg/dL   Indirect Bilirubin NOT CALCULATED 0.3 - 0.9 mg/dL    Comment: Performed at [facility], [place] [address], [place], [place] [address]    Studies/Results: No results found.  Medications: I have reviewed the patient's current medications.  Assessment: Crohn's disease, status post ileostomy, follow-up, continue close sigmoid history  High ostomy output-patient reports marked decrease in ostomy output  Normocytic  anemia, hemoglobin 9.6(decreased from 12.8 likely related to hemodilution) Improvement in function, BUN 35, creatinine 1.14, GFR 41, likely result of adequate rehydration Mild acidosis    Plan: Improvement ileostomy output(1560 ml) in 24 hours, improvement in renal function Tolerating regular diet Cholestyramine 4 g 3 times a day was added today by primary team Was on Metamucil 1 packet daily and Imodium 2 mg 3 times a day as needed Possible discharge tomorrow in am   Ronnette Juniper, MD 12/15/2021, 11:39 AM

## 2021-12-15 NOTE — Progress Notes (Signed)
PROGRESS NOTE    Cynthia [name]  CHY:[id number] DOB: 1961-08-01 DOA: 12/12/2021 PCP: Cynthia Lung, MD  Chief Complaint  Patient presents with   Abdominal Pain   Stoma Prolapse     Brief Narrative:  Cynthia [name] is Cynthia [name] 61 y.o. female with medical history significant of anxiety, depression, bronchitis, GERD, migraine headaches, hiatal hernia, mitral valve prolapse, hypertension, IBS, digital nonintrusive session, restless leg syndrome, partial bowel resection with colostomy who is coming to the emergency department with abdominal pain in stoma site, occasional nausea, lightheadedness, fatigue, frontal headache, dehydration in the setting of recent increase in colostomy output.  Of note, she was discharged in December after admission from 12/15-12/24 with high output from her ostomy, but it doesn't sound like she filled many of her prescriptions at discharge?   See below for additional details     Assessment & Plan:   Principal Problem:   AKI (acute kidney injury) ([place]) Active Problems:   High output ileostomy ([facility])   Hypotension   Benign essential HTN   Gastroesophageal reflux disease   Leukocytosis   Hyponatremia   Assessment and Plan: * AKI (acute kidney injury) ([place])- (present on admission) Improving with IVF  High output ileostomy ([place]) Improving 1.5 L out recorded yesterday Add cholestyramine.  Continue metamucil. has imodium prn. Discussed pentasa with GI, will hold off given nonadherence  last admission was on tincture of opium, imodium, iron, fibercon, lomotil, and choleystramine (per her med rec, she doesn't appear to be taking these at this time -> discussed with her and pharmacy, sounds like she only was taking lomotil and imodium?) CT abdomen pelvis without acute intraabdominal pathology Unclear cause for increased output -> crohn's flare? Follow fecal lactoferrin - low suspicion for c diff or other infectious diarrhea at this point, though will have low  threshold to follow these studies GI consult, appreciate assistance Strict I/O, daily weights   Hypotension- (present on admission) Improved, follow  Benign essential HTN- (present on admission) Hold antihypertensives (lasix)  Gastroesophageal reflux disease- (present on admission) PPI   DVT prophylaxis: lovenox Code Status: full Family Communication: none Disposition:   Status is: Inpatient Remains inpatient appropriate because: need for IVF, Gi c/s   Consultants:  gastroenterology  Procedures:  none  Antimicrobials:  Anti-infectives (From admission, onward)    None       Subjective: Increased output today  Objective: Vitals:   12/14/21 0515 12/14/21 1229 12/14/21 2105 12/15/21 0606  BP: (!) 82/57 113/73 (!) 94/55 101/67  Pulse: 76 80 80 84  Resp: 16 16 16 18   Temp: 97.8 F (36.6 C) 97.7 F (36.5 C) 98.6 F (37 C) 98.1 F (36.7 C)  TempSrc: Oral Oral Oral Oral  SpO2: 100% 100% 100% 100%  Weight:      Height:        Intake/Output Summary (Last 24 hours) at 12/15/2021 1219 Last data filed at 12/15/2021 1028 Gross per 24 hour  Intake 3675 ml  Output 2500 ml  Net 1175 ml   Filed Weights   12/13/21 0129  Weight: 51.6 kg    Examination:  General: No acute distress. Cardiovascular: RRR Lungs: unlabored Abdomen: Soft, nontender, nondistended - ileostomy with light brown loose/watery stool Neurological: Alert and oriented 3. Moves all extremities 4. Cranial nerves II through XII grossly intact. Skin: Warm and dry. No rashes or lesions. Extremities: No clubbing or cyanosis. No edema.   Data Reviewed: I have personally reviewed following labs and imaging studies  CBC: Recent  Labs  Lab 12/12/21 1712 12/13/21 0423 12/14/21 0436 12/15/21 0436  WBC 11.7* 15.0* 10.3 9.0  NEUTROABS 8.9* 10.9*  --   --   HGB 14.9 12.8 10.6* 9.6*  HCT 45.0 39.5 33.2* 31.4*  MCV 87.4 90.6 93.3 94.9  PLT 445* 331 285 419    Basic Metabolic Panel: Recent  Labs  Lab 12/12/21 1712 12/13/21 0423 12/14/21 0436 12/15/21 0436  NA 130* 130* 134* 135  K 4.4 3.9 3.7 4.0  CL 94* 100 108 111  CO2 24 19* 20* 18*  GLUCOSE 105* 104* 85 72  BUN 78* 74* 58* 35*  CREATININE 2.62* 2.39* 1.93* 1.44*  CALCIUM 9.7 8.7* 8.5* 8.4*  MG  --  2.1  --   --     GFR: Estimated Creatinine Clearance: 33 mL/min (Cynthia [name]) (by C-G formula based on SCr of 1.44 mg/dL (H)).  Liver Function Tests: Recent Labs  Lab 12/12/21 1712 12/13/21 0423 12/15/21 0436  AST 21 16 14*  ALT 19 14 13   ALKPHOS 82 64 46  BILITOT 0.6 0.1* 0.1*  PROT 8.9* 7.1 5.7*  ALBUMIN 4.6 3.7 3.0*    CBG: No results for input(s): GLUCAP in the last 168 hours.   Recent Results (from the past 240 hour(s))  Resp Panel by RT-PCR (Flu Cynthia [name]&B, Covid) Peripheral     Status: None   Collection Time: 12/12/21  5:06 PM   Specimen: Peripheral; Nasopharyngeal(NP) swabs in vial transport medium  Result Value Ref Range Status   SARS Coronavirus 2 by RT PCR NEGATIVE NEGATIVE Final    Comment: (NOTE) SARS-CoV-2 target nucleic acids are NOT DETECTED.  The SARS-CoV-2 RNA is generally detectable in upper respiratory specimens during the acute phase of infection. The lowest concentration of SARS-CoV-2 viral copies this assay can detect is 138 copies/mL. Cynthia [name] negative result does not preclude SARS-Cov-2 infection and should not be used as the sole basis for treatment or other patient management decisions. Cynthia [name] negative result may occur with  improper specimen collection/handling, submission of specimen other than nasopharyngeal swab, presence of viral mutation(s) within the areas targeted by this assay, and inadequate number of viral copies(<138 copies/mL). Cynthia [name] negative result must be combined with clinical observations, patient history, and epidemiological information. The expected result is Negative.  Fact Sheet for Patients:  Cynthia [name]  Fact Sheet for Healthcare Providers:   Cynthia [name]  This test is no t yet approved or cleared by the [place] FDA and  has been authorized for detection and/or diagnosis of SARS-CoV-2 by FDA under an Emergency Use Authorization (EUA). This EUA will remain  in effect (meaning this test can be used) for the duration of the COVID-19 declaration under Section 564(b)(1) of the Act, 21 U.S.C.section 360bbb-3(b)(1), unless the authorization is terminated  or revoked sooner.       Influenza Kerby Borner by PCR NEGATIVE NEGATIVE Final   Influenza B by PCR NEGATIVE NEGATIVE Final    Comment: (NOTE) The Xpert Xpress SARS-CoV-2/FLU/RSV plus assay is intended as an aid in the diagnosis of influenza from Nasopharyngeal swab specimens and should not be used as Arzella Rehmann sole basis for treatment. Nasal washings and aspirates are unacceptable for Xpert Xpress SARS-CoV-2/FLU/RSV testing.  Fact Sheet for Patients: Cynthia [name]  Fact Sheet for Healthcare Providers: Cynthia [name]  This test is not yet approved or cleared by the [place] FDA and has been authorized for detection and/or diagnosis of SARS-CoV-2 by FDA under an Emergency Use Authorization (EUA). This EUA will remain in effect (meaning this test can  be used) for the duration of the COVID-19 declaration under Section 564(b)(1) of the Act, 21 U.S.C. section 360bbb-3(b)(1), unless the authorization is terminated or revoked.  Performed at [facility], [place] [place] [address], [place], [place] [address]   Culture, blood (routine x 2)     Status: None (Preliminary result)   Collection Time: 12/12/21  5:07 PM   Specimen: BLOOD  Result Value Ref Range Status   Specimen Description   Final    BLOOD LEFT ANTECUBITAL Performed at [place] [address], [place], [place] [address]    Special Requests   Final    BOTTLES DRAWN AEROBIC AND ANAEROBIC Blood Culture  adequate volume Performed at [place] [address], [place], [place] [address]    Culture   Final    NO GROWTH 3 DAYS Performed at [facility], [place] [address] [place], [place] [address]    Report Status PENDING  Incomplete  Culture, blood (routine x 2)     Status: None (Preliminary result)   Collection Time: 12/12/21  5:23 PM   Specimen: BLOOD  Result Value Ref Range Status   Specimen Description   Final    BLOOD RIGHT ANTECUBITAL Performed at [place] [address] [place], [place] [address]    Special Requests   Final    BOTTLES DRAWN AEROBIC AND ANAEROBIC Blood Culture adequate volume Performed at [place] [address], [place], [place] [address]    Culture   Final    NO GROWTH 3 DAYS Performed at [facility], [place] [address], [place], [place] [address]    Report Status PENDING  Incomplete         Radiology Studies: No results found.      Scheduled Meds:  cholestyramine light  4 g Oral TID   enoxaparin (LOVENOX) injection  40 mg Subcutaneous Daily   psyllium  1 packet Oral Daily   Continuous Infusions:  lactated ringers 1,000 mL (12/15/21 0458)     LOS: 2 days    Time spent: over 30 min    Fayrene Helper, MD [facility]   To contact the attending provider between 7A-7P or the covering provider during after hours 7P-7A, please log into the web site [email or web address] and access using universal [place] password for that web site. If you do not have the password, please call the hospital operator.  12/15/2021, 12:19 PM

## 2021-12-16 ENCOUNTER — Inpatient Hospital Stay (HOSPITAL_COMMUNITY): Payer: Medicaid Other

## 2021-12-16 DIAGNOSIS — Z932 Ileostomy status: Secondary | ICD-10-CM

## 2021-12-16 DIAGNOSIS — R198 Other specified symptoms and signs involving the digestive system and abdomen: Secondary | ICD-10-CM

## 2021-12-16 LAB — BASIC METABOLIC PANEL
Anion gap: 5 (ref 5–15)
BUN: 30 mg/dL — ABNORMAL HIGH (ref 8–23)
CO2: 18 mmol/L — ABNORMAL LOW (ref 22–32)
Calcium: 8.5 mg/dL — ABNORMAL LOW (ref 8.9–10.3)
Chloride: 114 mmol/L — ABNORMAL HIGH (ref 98–111)
Creatinine, Ser: 0.98 mg/dL (ref 0.44–1.00)
GFR, Estimated: >60 mL/min (ref >60)
Glucose, Bld: 74 mg/dL (ref 70–99)
Potassium: 4.5 mmol/L (ref 3.5–5.1)
Sodium: 137 mmol/L (ref 135–145)

## 2021-12-16 LAB — HEPATIC FUNCTION PANEL
ALT: 13 U/L (ref 0–44)
AST: 15 U/L (ref 15–41)
Albumin: 2.9 g/dL — ABNORMAL LOW (ref 3.5–5.0)
Alkaline Phosphatase: 44 U/L (ref 38–126)
Bilirubin, Direct: <0.1 mg/dL (ref 0.0–0.2)
Total Bilirubin: 0.2 mg/dL — ABNORMAL LOW (ref 0.3–1.2)
Total Protein: 5.6 g/dL — ABNORMAL LOW (ref 6.5–8.1)

## 2021-12-16 LAB — LIPASE, BLOOD: Lipase: 58 U/L — ABNORMAL HIGH (ref 11–51)

## 2021-12-16 LAB — CBC
HCT: 33.8 % — ABNORMAL LOW (ref 36.0–46.0)
Hemoglobin: 10.6 g/dL — ABNORMAL LOW (ref 12.0–15.0)
MCH: 29.3 pg (ref 26.0–34.0)
MCHC: 31.4 g/dL (ref 30.0–36.0)
MCV: 93.4 fL (ref 80.0–100.0)
Platelets: 198 10*3/uL (ref 150–400)
RBC: 3.62 MIL/uL — ABNORMAL LOW (ref 3.87–5.11)
RDW: 13.3 % (ref 11.5–15.5)
WBC: 9.3 10*3/uL (ref 4.0–10.5)
nRBC: 0 % (ref 0.0–0.2)

## 2021-12-16 MED ORDER — LORAZEPAM 2 MG/ML IJ SOLN
0.5000 mg | Freq: Once | INTRAMUSCULAR | Status: AC | PRN
  Administered 2021-12-16: 0.5 mg via INTRAVENOUS
  Filled 2021-12-16: qty 1

## 2021-12-16 NOTE — Progress Notes (Addendum)
[facility] Gastroenterology Progress Note  Cynthia [name] [id number] 61 y.o. 1960/12/17  CC: Crohn's disease, s/p ileostomy   Subjective: Patient states she is having RUQ pain.  She is tearful. nothing makes it better or worse.  Unable to describe characteristic of pain.  Has not had this before.  Tolerating diet well.  Still having improved ostomy output.  Denies nausea/vomiting.  ROS : Review of Systems  Gastrointestinal:  Positive for abdominal pain (RUQ). Negative for blood in stool, constipation, diarrhea, heartburn, melena, nausea and vomiting.  Genitourinary:  Negative for dysuria and urgency.     Objective: Vital signs in last 24 hours: Vitals:   12/15/21 2218 12/16/21 0650  BP: (!) 102/58 112/65  Pulse: 88 79  Resp: 18 18  Temp: 98 F (36.7 C) 97.7 F (36.5 C)  SpO2: 100% 100%    Physical Exam:  General:  Alert, cooperative, tearful  Head:  Normocephalic, without obvious abnormality, atraumatic  Eyes:  Anicteric sclera, EOM's intact, conjunctival pallor  Lungs:   Clear to auscultation bilaterally, respirations unlabored  Heart:  Regular rate and rhythm, S1, S2 normal  Abdomen:   Soft, non-tender, bowel sounds active all four quadrants.  Ileostomy output-brown liquid.  Normal-appearing sigmoid mucous fistula.  Extremities: Extremities normal, atraumatic, no  edema  Pulses: 2+ and symmetric    Lab Results: Recent Labs    12/15/21 0436 12/16/21 0404  NA 135 137  K 4.0 4.5  CL 111 114*  CO2 18* 18*  GLUCOSE 72 74  BUN 35* 30*  CREATININE 1.44* 0.98  CALCIUM 8.4* 8.5*   Recent Labs    12/15/21 0436  AST 14*  ALT 13  ALKPHOS 46  BILITOT 0.1*  PROT 5.7*  ALBUMIN 3.0*   Recent Labs    12/15/21 0436 12/16/21 0404  WBC 9.0 9.3  HGB 9.6* 10.6*  HCT 31.4* 33.8*  MCV 94.9 93.4  PLT 269 198   No results for input(s): LABPROT, INR in the last 72 hours.    Assessment Crohn's disease s/p ileostomy: High output ileostomy -Hgb 10.6 -No leukocytosis -Improved  renal function: BUN 30, creatinine 0.98 -Ileostomy output: 1100  AKI  GERD  Plan: Improvement ileostomy output (1100 mL) in 24 hours.  Continuing to improve renal function. Tolerating regular diet. Cholestyramine 4G 3 times a day per primary team Metamucil 1 packet daily and Imodium 2 Mg 3 times a day as needed New acute RUQ pain, recent CT scan 2/9 showed cholelithiasis.  Could consider HIDA scan for further evaluation, if positive will consult surgery. [facility] will follow  Garnette Scheuermann PA-C 12/16/2021, 8:51 AM  Contact #  [phone]

## 2021-12-16 NOTE — Consult Note (Addendum)
[place] Nurse ostomy follow up Patient receiving care in [place]. Stoma type/location: RUQ ileostomy Stomal assessment/size: 1 3/8 inches, round, moist, budded Peristomal assessment: slightly impacted by effluent. Treatment options for stomal/peristomal skin: barrier ring and crusting with stoma powder and skin barrier film. Output: thin tan effluent Ostomy pouching: 2pc. 2 and 3/4 inches. Patient was in a 2 piece flat pouching system which was unable to keep up with the output. I removed everything. Sarah, primary RN, at bedside to observe care provided.  Peristomal skin washed with water only. Crusted with stoma powder and skin barrier wipes. Barrier ring placed. Skin barrier placed, then applied a high output pouch connected to a bedside drainage bag.  These interventions should help prevent the pouch from pulling off due to weight from the output, and allow the skin to heal. Education provided: all steps of the pouch care provided to patient and nurse. Enrolled patient in [place] Start Discharge program: Patient has a source of supplies when at home.  Additional high output pouches [name] [phone]) to be obtained and placed in the room. [place] nurse will not follow at this time.  Please re-consult the [place] team if needed.  Val Riles, RN, MSN, CWOCN, CNS-BC, pager [phone]

## 2021-12-16 NOTE — Consult Note (Signed)
Cynthia [name] ostomy follow up Patient receiving care in [place]. Patient was seen by [facility] on 12/13/21 and orders placed in the chart. The patient has undergone many teaching sessions by the [facility] team throughout the time she has had the ileostomy.  Despite our many concerted efforts, it is not uncommon once she is discharged, for her to drift away from the pouching techniques and systems we find helpful to her while in the hospital in containing the effluent and protecting the peristomal skin.  Also, she frequently has high output from the ileostomy resulting in electrolyte derangement and AKI.  The patient requires crusting with stoma powder and skin barrier film when the peristomal skin is damaged from liquid effluent leakage. She must use a barrier ring and may also require an ostomy belt to hold the system snuggly in place.  A high output pouching system is in order for her.  All of these items were ordered and reviewed on 12/13/21.    I spoke with her primary [name], Cynthia [name], by phone and she is unaware of any additional needs at this time.  I have entered the following nursing care instructional order: Provide the following ostomy supplies for the patient at the time of discharge: 5 barrier rings, Cynthia [name] # [id number]; 5 wafers Cynthia [name], 5 pouches Cynthia [name] # [id number], or high output pouch Cynthia [name] # [id number]; 2 ostomy powders, Cynthia [name] # 6; 2 ostomy  belts, Cynthia [name] # [id number]  [place] [name] will not follow at this time.  Please re-consult the [place] team if needed.  Cynthia Riles, RN, MSN, CWOCN, CNS-BC, pager [phone]

## 2021-12-16 NOTE — Assessment & Plan Note (Addendum)
Follow RUQ [place] no evidence cholecystitis Lipase peaked to 194, resolved - ? Mild pancreatitis, this wasn't reflected on imaging - unclear significance  resolved

## 2021-12-16 NOTE — Progress Notes (Signed)
Patient missing glasses sometime after 4pm. She cannot remember where she last had them but the NT reported seeing them on the patients head around 3:30pm.   Patient tearful at 1645 about being unable to find her glasses.   I called [place] department at 1700 and they did not report seeing any patient glasses.   Will continue to look for patient belongings.    [place], Therapist, sports

## 2021-12-16 NOTE — Progress Notes (Signed)
PROGRESS NOTE    Cynthia [name]  [name] DOB: 1961-05-10 DOA: 12/12/2021 PCP: Cynthia Lung, MD  Chief Complaint  Patient presents with   Abdominal Pain   Stoma Prolapse     Brief Narrative:  Cynthia [name] is Cynthia [name] 61 y.o. female with medical history significant of anxiety, depression, bronchitis, GERD, migraine headaches, hiatal hernia, mitral valve prolapse, hypertension, IBS, digital nonintrusive session, restless leg syndrome, partial bowel resection with colostomy who is coming to the emergency department with abdominal pain in stoma site, occasional nausea, lightheadedness, fatigue, frontal headache, dehydration in the setting of recent increase in colostomy output.  Of note, she was discharged in December after admission from 12/15-12/24 with high output from her ostomy, but it doesn't sound like she filled many of her prescriptions at discharge?   See below for additional details     Assessment & Plan:   Principal Problem:   High output ileostomy (Cynthia [name]) Active Problems:   AKI (acute kidney injury) ([place])   Abdominal pain   Hypotension   Benign essential HTN   Gastroesophageal reflux disease   Leukocytosis   Hyponatremia   Assessment and Plan: * High output ileostomy (HCC) Improving 1.1 L out recorded yesterday Cholestyramine, metamucil. has imodium prn. Discussed pentasa with GI, will hold off given nonadherence  last admission was on tincture of opium, imodium, iron, fibercon, lomotil, and choleystramine (per her med rec, she doesn't appear to be taking these at this time -> discussed with her and pharmacy, sounds like she only was taking lomotil and imodium?) CT abdomen pelvis without acute intraabdominal pathology Unclear cause for increased output -> crohn's flare? Follow fecal lactoferrin - negative GI consult, appreciate assistance Strict I/O, daily weights   Hypotension- (present on admission) Improved, follow  Abdominal pain- (present on  admission) Follow RUQ [place] (hida will be done tomorrow if needed) lipase mildly elevated, no c/w pancreatitis Consider repeat CT scan if not improving    AKI (acute kidney injury) ([place])- (present on admission) Improving with IVF  Benign essential HTN- (present on admission) Hold antihypertensives (lasix)  Gastroesophageal reflux disease- (present on admission) PPI   DVT prophylaxis: lovenox Code Status: full Family Communication: none Disposition:   Status is: Inpatient Remains inpatient appropriate because: need for IVF, Gi c/s   Consultants:  gastroenterology  Procedures:  none  Antimicrobials:  Anti-infectives (From admission, onward)    None       Subjective: C/o abdominal discomfort/pain today  Objective: Vitals:   12/15/21 2057 12/15/21 2218 12/16/21 0650 12/16/21 1221  BP: 111/64 (!) 102/58 112/65 102/62  Pulse: 91 88 79 75  Resp:  18 18 13   Temp:  98 F (36.7 C) 97.7 F (36.5 C) 98.4 F (36.9 C)  TempSrc:  Oral Oral Oral  SpO2:  100% 100% 100%  Weight:      Height:        Intake/Output Summary (Last 24 hours) at 12/16/2021 1344 Last data filed at 12/16/2021 1000 Gross per 24 hour  Intake 4060 ml  Output 600 ml  Net 3460 ml   Filed Weights   12/13/21 0129  Weight: 51.6 kg    Examination:  General: No acute distress. Cardiovascular: RRR Lungs: unlabored Abdomen: some more abdominal discomfort on exam today, L sided for me (though concern from GI for RUQ pain) - ileostomy  Neurological: Alert and oriented 3. Moves all extremities 4 . Cranial nerves II through XII grossly intact. Skin: Warm and dry. No rashes or lesions. Extremities: No  clubbing or cyanosis. No edema.   Data Reviewed: I have personally reviewed following labs and imaging studies  CBC: Recent Labs  Lab 12/12/21 1712 12/13/21 0423 12/14/21 0436 12/15/21 0436 12/16/21 0404  WBC 11.7* 15.0* 10.3 9.0 9.3  NEUTROABS 8.9* 10.9*  --   --   --   HGB 14.9 12.8 10.6*  9.6* 10.6*  HCT 45.0 39.5 33.2* 31.4* 33.8*  MCV 87.4 90.6 93.3 94.9 93.4  PLT 445* 331 285 269 785    Basic Metabolic Panel: Recent Labs  Lab 12/12/21 1712 12/13/21 0423 12/14/21 0436 12/15/21 0436 12/16/21 0404  NA 130* 130* 134* 135 137  K 4.4 3.9 3.7 4.0 4.5  CL 94* 100 108 111 114*  CO2 24 19* 20* 18* 18*  GLUCOSE 105* 104* 85 72 74  BUN 78* 74* 58* 35* 30*  CREATININE 2.62* 2.39* 1.93* 1.44* 0.98  CALCIUM 9.7 8.7* 8.5* 8.4* 8.5*  MG  --  2.1  --   --   --     GFR: Estimated Creatinine Clearance: 48.5 mL/min (by C-G formula based on SCr of 0.98 mg/dL).  Liver Function Tests: Recent Labs  Lab 12/12/21 1712 12/13/21 0423 12/15/21 0436 12/16/21 0404  AST 21 16 14* 15  ALT 19 14 13 13   ALKPHOS 82 64 46 44  BILITOT 0.6 0.1* 0.1* 0.2*  PROT 8.9* 7.1 5.7* 5.6*  ALBUMIN 4.6 3.7 3.0* 2.9*    CBG: No results for input(s): GLUCAP in the last 168 hours.   Recent Results (from the past 240 hour(s))  Resp Panel by RT-PCR (Flu Cynthia [name]&B, Covid) Peripheral     Status: None   Collection Time: 12/12/21  5:06 PM   Specimen: Peripheral; Nasopharyngeal(NP) swabs in vial transport medium  Result Value Ref Range Status   SARS Coronavirus 2 by RT PCR NEGATIVE NEGATIVE Final    Comment: (NOTE) SARS-CoV-2 target nucleic acids are NOT DETECTED.  The SARS-CoV-2 RNA is generally detectable in upper respiratory specimens during the acute phase of infection. The lowest concentration of SARS-CoV-2 viral copies this assay can detect is 138 copies/mL. Cynthia [name] negative result does not preclude SARS-Cov-2 infection and should not be used as the sole basis for treatment or other patient management decisions. Cynthia [name] negative result may occur with  improper specimen collection/handling, submission of specimen other than nasopharyngeal swab, presence of viral mutation(s) within the areas targeted by this assay, and inadequate number of viral copies(<138 copies/mL). Cynthia [name] negative result must be combined  with clinical observations, patient history, and epidemiological information. The expected result is Negative.  Fact Sheet for Patients:  Cynthia [name]  Fact Sheet for Healthcare Providers:  Cynthia [name]  This test is no t yet approved or cleared by the [place] FDA and  has been authorized for detection and/or diagnosis of SARS-CoV-2 by FDA under an Emergency Use Authorization (EUA). This EUA will remain  in effect (meaning this test can be used) for the duration of the COVID-19 declaration under Section 564(b)(1) of the Act, 21 U.S.C.section 360bbb-3(b)(1), unless the authorization is terminated  or revoked sooner.       Influenza Maya Scholer by PCR NEGATIVE NEGATIVE Final   Influenza B by PCR NEGATIVE NEGATIVE Final    Comment: (NOTE) The Xpert Xpress SARS-CoV-2/FLU/RSV plus assay is intended as an aid in the diagnosis of influenza from Nasopharyngeal swab specimens and should not be used as Chloris Marcoux sole basis for treatment. Nasal washings and aspirates are unacceptable for Xpert Xpress SARS-CoV-2/FLU/RSV testing.  Fact Sheet for Patients:  Cynthia [name]  Fact Sheet for Healthcare Providers: Cynthia [name]  This test is not yet approved or cleared by the [place] FDA and has been authorized for detection and/or diagnosis of SARS-CoV-2 by FDA under an Emergency Use Authorization (EUA). This EUA will remain in effect (meaning this test can be used) for the duration of the COVID-19 declaration under Section 564(b)(1) of the Act, 21 U.S.C. section 360bbb-3(b)(1), unless the authorization is terminated or revoked.  Performed at [facility], [place] [address], [place], [place] [address]   Culture, blood (routine x 2)     Status: None (Preliminary result)   Collection Time: 12/12/21  5:07 PM   Specimen: BLOOD  Result Value Ref Range Status   Specimen  Description   Final    BLOOD LEFT ANTECUBITAL Performed at [place] [address], [place], [place] [address]    Special Requests   Final    BOTTLES DRAWN AEROBIC AND ANAEROBIC Blood Culture adequate volume Performed at [place] [address] [place], [place] [address]    Culture   Final    NO GROWTH 4 DAYS Performed at [facility], [place] [address], [place], [place] [address]    Report Status PENDING  Incomplete  Culture, blood (routine x 2)     Status: None (Preliminary result)   Collection Time: 12/12/21  5:23 PM   Specimen: BLOOD  Result Value Ref Range Status   Specimen Description   Final    BLOOD RIGHT ANTECUBITAL Performed at [place] [address], [place], [place] [address]    Special Requests   Final    BOTTLES DRAWN AEROBIC AND ANAEROBIC Blood Culture adequate volume Performed at [place] [address] [place], [place] [address]    Culture   Final    NO GROWTH 4 DAYS Performed at [facility], [place] [address], [place], [place] [address]    Report Status PENDING  Incomplete         Radiology Studies: No results found.      Scheduled Meds:  cholestyramine light  4 g Oral TID   enoxaparin (LOVENOX) injection  40 mg Subcutaneous Daily   psyllium  1 packet Oral Daily   Continuous Infusions:  lactated ringers 125 mL/hr at 12/16/21 1119     LOS: 3 days    Time spent: over 30 min    Fayrene Helper, MD [facility]   To contact the attending provider between 7A-7P or the covering provider during after hours 7P-7A, please log into the web site [email or web address] and access using universal  password for that web site. If you do not have the password, please call the hospital operator.  12/16/2021, 1:44 PM

## 2021-12-17 DIAGNOSIS — R198 Other specified symptoms and signs involving the digestive system and abdomen: Secondary | ICD-10-CM | POA: Diagnosis not present

## 2021-12-17 DIAGNOSIS — Z932 Ileostomy status: Secondary | ICD-10-CM | POA: Diagnosis not present

## 2021-12-17 LAB — COMPREHENSIVE METABOLIC PANEL
ALT: 11 U/L (ref 0–44)
AST: 15 U/L (ref 15–41)
Albumin: 2.5 g/dL — ABNORMAL LOW (ref 3.5–5.0)
Alkaline Phosphatase: 37 U/L — ABNORMAL LOW (ref 38–126)
Anion gap: 5 (ref 5–15)
BUN: 27 mg/dL — ABNORMAL HIGH (ref 8–23)
CO2: 18 mmol/L — ABNORMAL LOW (ref 22–32)
Calcium: 8.4 mg/dL — ABNORMAL LOW (ref 8.9–10.3)
Chloride: 113 mmol/L — ABNORMAL HIGH (ref 98–111)
Creatinine, Ser: 1.19 mg/dL — ABNORMAL HIGH (ref 0.44–1.00)
GFR, Estimated: 52 mL/min — ABNORMAL LOW (ref >60)
Glucose, Bld: 72 mg/dL (ref 70–99)
Potassium: 4.4 mmol/L (ref 3.5–5.1)
Sodium: 136 mmol/L (ref 135–145)
Total Bilirubin: 0.2 mg/dL — ABNORMAL LOW (ref 0.3–1.2)
Total Protein: 4.9 g/dL — ABNORMAL LOW (ref 6.5–8.1)

## 2021-12-17 LAB — CBC WITH DIFFERENTIAL/PLATELET
Abs Immature Granulocytes: 0.04 10*3/uL (ref 0.00–0.07)
Basophils Absolute: 0 10*3/uL (ref 0.0–0.1)
Basophils Relative: 0 %
Eosinophils Absolute: 0.3 10*3/uL (ref 0.0–0.5)
Eosinophils Relative: 3 %
HCT: 27.8 % — ABNORMAL LOW (ref 36.0–46.0)
Hemoglobin: 8.7 g/dL — ABNORMAL LOW (ref 12.0–15.0)
Immature Granulocytes: 1 %
Lymphocytes Relative: 27 %
Lymphs Abs: 2.2 10*3/uL (ref 0.7–4.0)
MCH: 29.5 pg (ref 26.0–34.0)
MCHC: 31.3 g/dL (ref 30.0–36.0)
MCV: 94.2 fL (ref 80.0–100.0)
Monocytes Absolute: 0.5 10*3/uL (ref 0.1–1.0)
Monocytes Relative: 6 %
Neutro Abs: 5.1 10*3/uL (ref 1.7–7.7)
Neutrophils Relative %: 63 %
Platelets: 232 10*3/uL (ref 150–400)
RBC: 2.95 MIL/uL — ABNORMAL LOW (ref 3.87–5.11)
RDW: 13.5 % (ref 11.5–15.5)
WBC: 8.2 10*3/uL (ref 4.0–10.5)
nRBC: 0 % (ref 0.0–0.2)

## 2021-12-17 LAB — CULTURE, BLOOD (ROUTINE X 2)
Culture: NO GROWTH
Culture: NO GROWTH
Special Requests: ADEQUATE
Special Requests: ADEQUATE

## 2021-12-17 LAB — MAGNESIUM: Magnesium: 1.5 mg/dL — ABNORMAL LOW (ref 1.7–2.4)

## 2021-12-17 LAB — PHOSPHORUS: Phosphorus: 2.6 mg/dL (ref 2.5–4.6)

## 2021-12-17 LAB — LIPASE, BLOOD: Lipase: 194 U/L — ABNORMAL HIGH (ref 11–51)

## 2021-12-17 MED ORDER — MAGNESIUM SULFATE 2 GM/50ML IV SOLN
2.0000 g | Freq: Once | INTRAVENOUS | Status: AC
  Administered 2021-12-17: 2 g via INTRAVENOUS
  Filled 2021-12-17: qty 50

## 2021-12-17 MED ORDER — OXYCODONE HCL 5 MG PO TABS
5.0000 mg | ORAL_TABLET | ORAL | Status: DC | PRN
Start: 2021-12-17 — End: 2021-12-27
  Administered 2021-12-18 – 2021-12-25 (×10): 5 mg via ORAL
  Filled 2021-12-17 (×10): qty 1

## 2021-12-17 NOTE — Progress Notes (Signed)
PROGRESS NOTE    [name]  ZOX:[id number] DOB: [birth year] DOA: 12/12/2021 PCP: Denita Lung, MD  Chief Complaint  Patient presents with   Abdominal Pain   Stoma Prolapse     Brief Narrative:  Cynthia [name] is Cynthia [name] 61 y.o. female with medical history significant of anxiety, depression, bronchitis, GERD, migraine headaches, hiatal hernia, mitral valve prolapse, hypertension, IBS, digital nonintrusive session, restless leg syndrome, partial bowel resection with colostomy who is coming to the emergency department with abdominal pain in stoma site, occasional nausea, lightheadedness, fatigue, frontal headache, dehydration in the setting of recent increase in colostomy output.  Of note, she was discharged in December after admission from 12/15-12/24 with high output from her ostomy, but it doesn't sound like she filled many of her prescriptions at discharge?   See below for additional details     Assessment & Plan:   Principal Problem:   High output ileostomy ([place]) Active Problems:   AKI (acute kidney injury) ([place])   Abdominal pain   Hypotension   Benign essential HTN   Gastroesophageal reflux disease   Leukocytosis   Hyponatremia   Assessment and Plan: * High output ileostomy (HCC) Improving 1.4 L out this morning Cholestyramine, metamucil. has imodium prn. Discussed pentasa with GI, will hold off given nonadherence  last admission was on tincture of opium, imodium, iron, fibercon, lomotil, and choleystramine (per her med rec, she doesn't appear to be taking these at this time -> discussed with her and pharmacy, sounds like she only was taking lomotil and imodium?) CT abdomen pelvis without acute intraabdominal pathology Unclear cause for increased output  Follow fecal lactoferrin - negative GI consult, appreciate assistance Strict I/O, daily weights   Hypotension- (present on admission) Improved, follow  Abdominal pain- (present on admission) Follow RUQ [place] no  evidence cholecystitis lipase higher today, ? Mild pancreatitis Will increase IVF, full liquids for now, continue to monitor Consider repeat CT scan if not improving    AKI (acute kidney injury) ([place])- (present on admission) Improving with IVF  Benign essential HTN- (present on admission) Hold antihypertensives (lasix)  Gastroesophageal reflux disease- (present on admission) PPI   DVT prophylaxis: lovenox Code Status: full Family Communication: none Disposition:   Status is: Inpatient Remains inpatient appropriate because: need for IVF, Gi c/s   Consultants:  gastroenterology  Procedures:  none  Antimicrobials:  Anti-infectives (From admission, onward)    None       Subjective: Continued abdominal discomfort  Objective: Vitals:   12/16/21 1221 12/16/21 2203 12/17/21 0500 12/17/21 1317  BP: 102/62 93/62 105/67 105/68  Pulse: 75 86 80 83  Resp: 13 16 14 14   Temp: 98.4 F (36.9 C) 97.8 F (36.6 C) 97.9 F (36.6 C) 98.4 F (36.9 C)  TempSrc: Oral Oral Oral Oral  SpO2: 100% 100% 100% 100%  Weight:      Height:        Intake/Output Summary (Last 24 hours) at 12/17/2021 1424 Last data filed at 12/17/2021 1330 Gross per 24 hour  Intake 2272.87 ml  Output 2250 ml  Net 22.87 ml   Filed Weights   12/13/21 0129  Weight: 51.6 kg    Examination:  General: No acute distress. Cardiovascular: RRR Lungs: unlabored Abdomen: mild LUQ abdominal discomfort, ileostomy  Neurological: Alert and oriented 3. Moves all extremities 4. Cranial nerves II through XII grossly intact. Skin: Warm and dry. No rashes or lesions. Extremities: No clubbing or cyanosis. No edema.   Data Reviewed: I have  personally reviewed following labs and imaging studies  CBC: Recent Labs  Lab 12/12/21 1712 12/13/21 0423 12/14/21 0436 12/15/21 0436 12/16/21 0404 12/17/21 0442  WBC 11.7* 15.0* 10.3 9.0 9.3 8.2  NEUTROABS 8.9* 10.9*  --   --   --  5.1  HGB 14.9 12.8 10.6* 9.6*  10.6* 8.7*  HCT 45.0 39.5 33.2* 31.4* 33.8* 27.8*  MCV 87.4 90.6 93.3 94.9 93.4 94.2  PLT 445* 331 285 269 198 151    Basic Metabolic Panel: Recent Labs  Lab 12/13/21 0423 12/14/21 0436 12/15/21 0436 12/16/21 0404 12/17/21 0442  NA 130* 134* 135 137 136  K 3.9 3.7 4.0 4.5 4.4  CL 100 108 111 114* 113*  CO2 19* 20* 18* 18* 18*  GLUCOSE 104* 85 72 74 72  BUN 74* 58* 35* 30* 27*  CREATININE 2.39* 1.93* 1.44* 0.98 1.19*  CALCIUM 8.7* 8.5* 8.4* 8.5* 8.4*  MG 2.1  --   --   --  1.5*  PHOS  --   --   --   --  2.6    GFR: Estimated Creatinine Clearance: 39.9 mL/min ([place]) (by C-G formula based on SCr of 1.19 mg/dL (H)).  Liver Function Tests: Recent Labs  Lab 12/12/21 1712 12/13/21 0423 12/15/21 0436 12/16/21 0404 12/17/21 0442  AST 21 16 14* 15 15  ALT 19 14 13 13 11   ALKPHOS 82 64 46 44 37*  BILITOT 0.6 0.1* 0.1* 0.2* 0.2*  PROT 8.9* 7.1 5.7* 5.6* 4.9*  ALBUMIN 4.6 3.7 3.0* 2.9* 2.5*    CBG: No results for input(s): GLUCAP in the last 168 hours.   Recent Results (from the past 240 hour(s))  Resp Panel by RT-PCR (Flu Bowe Sidor&B, Covid) Peripheral     Status: None   Collection Time: 12/12/21  5:06 PM   Specimen: Peripheral; Nasopharyngeal(NP) swabs in vial transport medium  Result Value Ref Range Status   SARS Coronavirus 2 by RT PCR NEGATIVE NEGATIVE Final    Comment: (NOTE) SARS-CoV-2 target nucleic acids are NOT DETECTED.  The SARS-CoV-2 RNA is generally detectable in upper respiratory specimens during the acute phase of infection. The lowest concentration of SARS-CoV-2 viral copies this assay can detect is 138 copies/mL. Gabrielly Mccrystal negative result does not preclude SARS-Cov-2 infection and should not be used as the sole basis for treatment or other patient management decisions. Karysa Heft negative result may occur with  improper specimen collection/handling, submission of specimen other than nasopharyngeal swab, presence of viral mutation(s) within the areas targeted by this assay,  and inadequate number of viral copies(<138 copies/mL). Mitzie Marlar negative result must be combined with clinical observations, patient history, and epidemiological information. The expected result is Negative.  Fact Sheet for Patients:  EntrepreneurPulse.com.au  Fact Sheet for Healthcare Providers:  IncredibleEmployment.be  This test is no t yet approved or cleared by the [place] FDA and  has been authorized for detection and/or diagnosis of SARS-CoV-2 by FDA under an Emergency Use Authorization (EUA). This EUA will remain  in effect (meaning this test can be used) for the duration of the COVID-19 declaration under Section 564(b)(1) of the Act, 21 U.S.C.section 360bbb-3(b)(1), unless the authorization is terminated  or revoked sooner.       Influenza Monai Hindes by PCR NEGATIVE NEGATIVE Final   Influenza B by PCR NEGATIVE NEGATIVE Final    Comment: (NOTE) The Xpert Xpress SARS-CoV-2/FLU/RSV plus assay is intended as an aid in the diagnosis of influenza from Nasopharyngeal swab specimens and should not be used as Jordi Lacko  sole basis for treatment. Nasal washings and aspirates are unacceptable for Xpert Xpress SARS-CoV-2/FLU/RSV testing.  Fact Sheet for Patients: EntrepreneurPulse.com.au  Fact Sheet for Healthcare Providers: IncredibleEmployment.be  This test is not yet approved or cleared by the [place] FDA and has been authorized for detection and/or diagnosis of SARS-CoV-2 by FDA under an Emergency Use Authorization (EUA). This EUA will remain in effect (meaning this test can be used) for the duration of the COVID-19 declaration under Section 564(b)(1) of the Act, 21 U.S.C. section 360bbb-3(b)(1), unless the authorization is terminated or revoked.  Performed at [facility], [place] [address], [place], [place] [address]   Culture, blood (routine x 2)     Status: None   Collection Time: 12/12/21   5:07 PM   Specimen: BLOOD  Result Value Ref Range Status   Specimen Description   Final    BLOOD LEFT ANTECUBITAL Performed at [place] [address] [place], [place] [address]    Special Requests   Final    BOTTLES DRAWN AEROBIC AND ANAEROBIC Blood Culture adequate volume Performed at [place] [address] [place], [place] [address]    Culture   Final    NO GROWTH 5 DAYS Performed at [facility], [place] [address] [place], [place] [address]    Report Status 12/17/2021 FINAL  Final  Culture, blood (routine x 2)     Status: None   Collection Time: 12/12/21  5:23 PM   Specimen: BLOOD  Result Value Ref Range Status   Specimen Description   Final    BLOOD RIGHT ANTECUBITAL Performed at [place] [address] [place], [place] [address]    Special Requests   Final    BOTTLES DRAWN AEROBIC AND ANAEROBIC Blood Culture adequate volume Performed at [place] [address], [place], [place] [address]    Culture   Final    NO GROWTH 5 DAYS Performed at [facility], [place] [address], [place], [place] [address]    Report Status 12/17/2021 FINAL  Final         Radiology Studies: US Abdomen Limited RUQ (LIVER/GB)  Result Date: 12/16/2021 CLINICAL DATA:  Abdominal pain EXAM: ULTRASOUND ABDOMEN LIMITED RIGHT UPPER QUADRANT COMPARISON:  CT 12/12/2021 FINDINGS: Limited exam due to the patient's ileostomy bag. Gallbladder: Gallbladder is nondilated with multiple intraluminal stones, largest measuring 1.6 cm. No wall thickening or pericholecystic fluid. Negative sonographic Murphy sign. Common bile duct: Diameter: 2.1 mm Liver: No focal lesion identified. Within normal limits in parenchymal echogenicity. Portal vein is patent on color Doppler imaging with normal direction of blood flow towards the liver. Other: None. IMPRESSION: Cholelithiasis.  No evidence of acute cholecystitis.  Electronically Signed   By: Maurine Simmering M.D.   On: 12/16/2021 17:00        Scheduled Meds:  cholestyramine light  4 g Oral TID   enoxaparin (LOVENOX) injection  40 mg Subcutaneous Daily   psyllium  1 packet Oral Daily   Continuous Infusions:  lactated ringers 150 mL/hr at 12/17/21 0926     LOS: 4 days    Time spent: over 30 min    Fayrene Helper, MD [facility]   To contact the attending provider between 7A-7P or the covering provider during after hours 7P-7A, please log into the web site [email or web address] and access using universal [place] password for that web site. If you do not have the password, please  call the hospital operator.  12/17/2021, 2:24 PM

## 2021-12-17 NOTE — Progress Notes (Signed)
[facility] Gastroenterology Progress Note  Cynthia [name] MEQASTMH 61 y.o. [birth year]  CC:  Crohn's disease, s/p ileostomy   Subjective: Patient sitting up in bed this morning, states RUQ pain has improved significantly though is still there is a dull, achy pain.  Denies nausea, vomiting.  Tolerating diet well.  Patient continues to have increased ostomy output and states she is emptying bag hourly.  She also reports increased amount of gas in the bag.   ROS : Review of Systems  Constitutional:  Negative for chills and fever.  Gastrointestinal:  Positive for abdominal pain (RUQ, dull, improved). Negative for heartburn, nausea and vomiting.  Genitourinary:  Negative for dysuria and hematuria.     Objective: Vital signs in last 24 hours: Vitals:   12/16/21 2203 12/17/21 0500  BP: 93/62 105/67  Pulse: 86 80  Resp: 16 14  Temp: 97.8 F (36.6 C) 97.9 F (36.6 C)  SpO2: 100% 100%    Physical Exam:  General:  Alert, cooperative, no distress  Head:  Normocephalic, without obvious abnormality, atraumatic  Eyes:  Anicteric sclera, EOM's intact, conjunctival pallor  Lungs:   Clear to auscultation bilaterally, respirations unlabored  Heart:  Regular rate and rhythm, S1, S2 normal  Abdomen:   Soft, non-tender, bowel sounds active all four quadrants. Ileostomy output - brown/green liquid. Normal-appearing sigmoid mucous fistula.  Extremities: Extremities normal, atraumatic, no  edema  Pulses: 2+ and symmetric    Lab Results: Recent Labs    12/16/21 0404 12/17/21 0442  NA 137 136  K 4.5 4.4  CL 114* 113*  CO2 18* 18*  GLUCOSE 74 72  BUN 30* 27*  CREATININE 0.98 1.19*  CALCIUM 8.5* 8.4*  MG  --  1.5*  PHOS  --  2.6   Recent Labs    12/16/21 0404 12/17/21 0442  AST 15 15  ALT 13 11  ALKPHOS 44 37*  BILITOT 0.2* 0.2*  PROT 5.6* 4.9*  ALBUMIN 2.9* 2.5*   Recent Labs    12/16/21 0404 12/17/21 0442  WBC 9.3 8.2  NEUTROABS  --  5.1  HGB 10.6* 8.7*  HCT 33.8* 27.8*  MCV 93.4  94.2  PLT 198 232   No results for input(s): LABPROT, INR in the last 72 hours.    Assessment Crohn's disease s/p ileostomy: High output ileostomy -Hgb 8.7 (10.6) -No leukocytosis -Improved renal function: BUN 27, Cr 1.19 -Ileostomy output: 1400 in 24 hours   AKI   GERD   Plan: Ileostomy output 1400 in 24 hours.  Renal function continues to improve. Tolerating regular diet. Cholestyramine 4G 3 times a day per primary team Metamucil 1 packet daily and Imodium 2 Mg 3 times a day as needed RUQ pain still present but improved since yesterday. RUQ ultrasound revealed cholelithiasis without evidence of acute cholecystitis. Would hold off on HIDA scan at this time. Pain located at ostomy site and most likely due to ostomy rather than gallbladder. [facility] will follow  Angelique Holm PA-C 12/17/2021, 10:21 AM  Contact #  [phone]

## 2021-12-18 DIAGNOSIS — Z932 Ileostomy status: Secondary | ICD-10-CM | POA: Diagnosis not present

## 2021-12-18 DIAGNOSIS — R198 Other specified symptoms and signs involving the digestive system and abdomen: Secondary | ICD-10-CM | POA: Diagnosis not present

## 2021-12-18 LAB — CBC WITH DIFFERENTIAL/PLATELET
Abs Immature Granulocytes: 0.04 10*3/uL (ref 0.00–0.07)
Basophils Absolute: 0 10*3/uL (ref 0.0–0.1)
Basophils Relative: 0 %
Eosinophils Absolute: 0.4 10*3/uL (ref 0.0–0.5)
Eosinophils Relative: 5 %
HCT: 34.4 % — ABNORMAL LOW (ref 36.0–46.0)
Hemoglobin: 10.3 g/dL — ABNORMAL LOW (ref 12.0–15.0)
Immature Granulocytes: 0 %
Lymphocytes Relative: 27 %
Lymphs Abs: 2.5 10*3/uL (ref 0.7–4.0)
MCH: 28.9 pg (ref 26.0–34.0)
MCHC: 29.9 g/dL — ABNORMAL LOW (ref 30.0–36.0)
MCV: 96.4 fL (ref 80.0–100.0)
Monocytes Absolute: 0.5 10*3/uL (ref 0.1–1.0)
Monocytes Relative: 5 %
Neutro Abs: 5.7 10*3/uL (ref 1.7–7.7)
Neutrophils Relative %: 63 %
Platelets: 245 10*3/uL (ref 150–400)
RBC: 3.57 MIL/uL — ABNORMAL LOW (ref 3.87–5.11)
RDW: 13.3 % (ref 11.5–15.5)
WBC: 9.2 10*3/uL (ref 4.0–10.5)
nRBC: 0 % (ref 0.0–0.2)

## 2021-12-18 LAB — LIPASE, BLOOD: Lipase: 44 U/L (ref 11–51)

## 2021-12-18 LAB — GLUCOSE, CAPILLARY
Glucose-Capillary: 106 mg/dL — ABNORMAL HIGH (ref 70–99)
Glucose-Capillary: 111 mg/dL — ABNORMAL HIGH (ref 70–99)
Glucose-Capillary: 60 mg/dL — ABNORMAL LOW (ref 70–99)
Glucose-Capillary: 83 mg/dL (ref 70–99)
Glucose-Capillary: 83 mg/dL (ref 70–99)
Glucose-Capillary: 95 mg/dL (ref 70–99)
Glucose-Capillary: 98 mg/dL (ref 70–99)

## 2021-12-18 LAB — COMPREHENSIVE METABOLIC PANEL
ALT: 13 U/L (ref 0–44)
AST: 12 U/L — ABNORMAL LOW (ref 15–41)
Albumin: 2.9 g/dL — ABNORMAL LOW (ref 3.5–5.0)
Alkaline Phosphatase: 43 U/L (ref 38–126)
Anion gap: 4 — ABNORMAL LOW (ref 5–15)
BUN: 21 mg/dL (ref 8–23)
CO2: 19 mmol/L — ABNORMAL LOW (ref 22–32)
Calcium: 8.6 mg/dL — ABNORMAL LOW (ref 8.9–10.3)
Chloride: 113 mmol/L — ABNORMAL HIGH (ref 98–111)
Creatinine, Ser: 0.92 mg/dL (ref 0.44–1.00)
GFR, Estimated: >60 mL/min (ref >60)
Glucose, Bld: 62 mg/dL — ABNORMAL LOW (ref 70–99)
Potassium: 4.4 mmol/L (ref 3.5–5.1)
Sodium: 136 mmol/L (ref 135–145)
Total Bilirubin: 0.1 mg/dL — ABNORMAL LOW (ref 0.3–1.2)
Total Protein: 5.5 g/dL — ABNORMAL LOW (ref 6.5–8.1)

## 2021-12-18 LAB — MAGNESIUM: Magnesium: 1.6 mg/dL — ABNORMAL LOW (ref 1.7–2.4)

## 2021-12-18 LAB — PHOSPHORUS: Phosphorus: 2.1 mg/dL — ABNORMAL LOW (ref 2.5–4.6)

## 2021-12-18 MED ORDER — DEXTROSE IN LACTATED RINGERS 5 % IV SOLN: INTRAVENOUS | Status: DC

## 2021-12-18 NOTE — Progress Notes (Signed)
PROGRESS NOTE   Cynthia [name]  XBM:[id number] DOB: Sep 04, 1961 DOA: 12/12/2021 PCP: Denita Lung, MD  Brief Narrative:  61 y.o. fanxiety, depression, bronchitis, GERD, migraine headaches, hiatal hernia, mitral valve prolapse, hypertension, IBS, digital nonintrusive session, restless leg syndrome,   Crohn's disease status post sigmoidectomy 02/2021 with Dr. Hilliard Clark at [facility] with multiple complications including prior SBO's in 01/2021, 01/30/2021 She was admitted by general surgery in the past also for AKI from high output ostomy-she was started on varied meds--unclear if compliant?  discharged in December after admission from 12/15-12/24 with high output from her--GI was consulted and patient was started on tincture of opium 6 4 times daily, Imodium 4 4 times daily, iron twice daily, FiberCon twice daily, Lomotil 2 4 times daily, cholestyramine 4 3 times daily and mesalamine was added-goal output recommended at the time was less than 1 L  Unclear whether the patient actually took any of these meds post that admission Lost to follow-up secondary to insurance issues Lost 120 pounds in 2 years Not on any Crohn's meds Last colonoscopy Cirigliano 10/20 = Crohn's ileitis tubular adenomas  partial bowel resection with colostomy   Readmit 12/13/2021 nausea + lightheaded + fatigue + headache +?  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 at [facility] Defer to GI planning--current meds Imodium 2 mg 3 times daily as needed, Metamucil 1 packet daily, cholestyramine 4 g 3 times daily Inflammatory bowel process ruled out-lactoferrin negative-CT abdomen pelvis no pathology Ostomy nurse following-appreciate their input and management of the area  locally Continue D5 LR 125 cc/H Mild hypoglycemia this morning 2/15 Improved-check sugars periodically today and then DC Needs to increase her diet Unfounded concern for cholecystitis Ultrasound RUQ no evidence of cholecystitis only cholelithiasis--do not think this is pancreatitis-feel lipase is falsely elevated because of her altered GI anatomy/ostomy Defer to GI AKI present on admission Fluids as above lactoferrin is negative ruling out inflammatory  DVT prophylaxis: Lovenox Code Status: Full Family Communication: None Disposition:  Status is: Inpatient Remains inpatient appropriate because:   Not ready for discharge    Consultants:  Gastroenterology  Procedures: n  Antimicrobials: n    Subjective: Awake coherent no distress but somewhat emotional today given all of the stressors that she has going on around her No chest pain no fever Abdomen is fair  Objective: Vitals:   12/17/21 1526 12/17/21 2224 12/18/21 0527 12/18/21 1214  BP: 104/72 121/79 110/73 105/67  Pulse: 92 80 90 88  Resp: 16  15 16   Temp: 98 F (36.7 C) 99 F (37.2 C) 98 F (36.7 C) 98.5 F (36.9 C)  TempSrc: Oral Oral Oral Oral  SpO2: 100% 100% 100% 100%  Weight:      Height:        Intake/Output Summary (Last 24 hours) at 12/18/2021 1527 Last data filed at 12/18/2021 1400 Gross per 24 hour  Intake 4772.62 ml  Output 1325 ml  Net 3447.62 ml   Filed Weights   12/13/21 0129  Weight: 51.6 kg    Examination:  EOMI NCAT no focal deficit CTA B no rales no rhonchi no wheezing Flat affect begins to cry when I start talking to her about her social issues and asking who will take care of her Abdomen soft no rebound no  guarding No lower extremity edema Ambulatory  Data Reviewed: personally reviewed   CBC    Component Value Date/Time   WBC 9.2 12/18/2021 0440   RBC 3.57 (L) 12/18/2021 0440   HGB 10.3 (L) 12/18/2021 0440   HGB 8.8 (L) 07/19/2021 1324   HCT 34.4 (L) 12/18/2021 0440    HCT 27.3 (L) 07/19/2021 1324   PLT 245 12/18/2021 0440   PLT 360 07/19/2021 1324   MCV 96.4 12/18/2021 0440   MCV 91 07/19/2021 1324   MCH 28.9 12/18/2021 0440   MCHC 29.9 (L) 12/18/2021 0440   RDW 13.3 12/18/2021 0440   RDW 12.5 07/19/2021 1324   LYMPHSABS 2.5 12/18/2021 0440   LYMPHSABS 2.1 07/19/2021 1324   MONOABS 0.5 12/18/2021 0440   EOSABS 0.4 12/18/2021 0440   EOSABS 0.2 07/19/2021 1324   BASOSABS 0.0 12/18/2021 0440   BASOSABS 0.0 07/19/2021 1324   CMP Latest Ref Rng & Units 12/18/2021 12/17/2021 12/16/2021  Glucose 70 - 99 mg/dL 62(L) 72 74  BUN 8 - 23 mg/dL 21 27(H) 30(H)  Creatinine 0.44 - 1.00 mg/dL 0.92 1.19(H) 0.98  Sodium 135 - 145 mmol/L 136 136 137  Potassium 3.5 - 5.1 mmol/L 4.4 4.4 4.5  Chloride 98 - 111 mmol/L 113(H) 113(H) 114(H)  CO2 22 - 32 mmol/L 19(L) 18(L) 18(L)  Calcium 8.9 - 10.3 mg/dL 8.6(L) 8.4(L) 8.5(L)  Total Protein 6.5 - 8.1 g/dL 5.5(L) 4.9(L) 5.6(L)  Total Bilirubin 0.3 - 1.2 mg/dL 0.1(L) 0.2(L) 0.2(L)  Alkaline Phos 38 - 126 U/L 43 37(L) 44  AST 15 - 41 U/L 12(L) 15 15  ALT 0 - 44 U/L 13 11 13      Radiology Studies: US Abdomen Limited RUQ (LIVER/GB)  Result Date: 12/16/2021 CLINICAL DATA:  Abdominal pain EXAM: ULTRASOUND ABDOMEN LIMITED RIGHT UPPER QUADRANT COMPARISON:  CT 12/12/2021 FINDINGS: Limited exam due to the patient's ileostomy bag. Gallbladder: Gallbladder is nondilated with multiple intraluminal stones, largest measuring 1.6 cm. No wall thickening or pericholecystic fluid. Negative sonographic Murphy sign. Common bile duct: Diameter: 2.1 mm Liver: No focal lesion identified. Within normal limits in parenchymal echogenicity. Portal vein is patent on color Doppler imaging with normal direction of blood flow towards the liver. Other: None. IMPRESSION: Cholelithiasis.  No evidence of acute cholecystitis. Electronically Signed   By: Maurine Simmering M.D.   On: 12/16/2021 17:00     Scheduled Meds:  cholestyramine light  4 g Oral TID    enoxaparin (LOVENOX) injection  40 mg Subcutaneous Daily   psyllium  1 packet Oral Daily   Continuous Infusions:  dextrose 5% lactated ringers 125 mL/hr at 12/18/21 1400     LOS: 5 days   Time spent: [place], MD [facility] To contact the attending provider between 7A-7P or the covering provider during after hours 7P-7A, please log into the web site [email or web address] and access using universal [place] password for that web site. If you do not have the password, please call the hospital operator.  12/18/2021, 3:27 PM

## 2021-12-18 NOTE — Progress Notes (Signed)
[facility] Progress Note  Cynthia [name] [id number] 61 y.o. February 07, 1961  CC: Crohn's disease, s/p ileostomy   Subjective: Patient is sitting up in bed this morning eating her breakfast without difficulty.  She states she is feeling much better today.  Denies abdominal pain.  Denies nausea/vomiting.  States she feels as if her ileostomy output has decreased.  ROS : Review of Systems  Gastrointestinal:  Negative for abdominal pain, blood in stool, constipation, diarrhea, heartburn, melena, nausea and vomiting.  Genitourinary:  Negative for dysuria and urgency.     Objective: Vital signs in last 24 hours: Vitals:   12/17/21 2224 12/18/21 0527  BP: 121/79 110/73  Pulse: 80 90  Resp:  15  Temp: 99 F (37.2 C) 98 F (36.7 C)  SpO2: 100% 100%    Physical Exam:  General:  Alert, cooperative, no distress, appears stated age  Head:  Normocephalic, without obvious abnormality, atraumatic  Eyes:  Anicteric sclera, EOM's intact  Lungs:   Clear to auscultation bilaterally, respirations unlabored  Heart:  Regular rate and rhythm, S1, S2 normal  Abdomen:   Soft, non-tender, bowel sounds active all four quadrants,  no masses,     Lab Results: Recent Labs    12/17/21 0442 12/18/21 0440  NA 136 136  K 4.4 4.4  CL 113* 113*  CO2 18* 19*  GLUCOSE 72 62*  BUN 27* 21  CREATININE 1.19* 0.92  CALCIUM 8.4* 8.6*  MG 1.5* 1.6*  PHOS 2.6 2.1*   Recent Labs    12/17/21 0442 12/18/21 0440  AST 15 12*  ALT 11 13  ALKPHOS 37* 43  BILITOT 0.2* 0.1*  PROT 4.9* 5.5*  ALBUMIN 2.5* 2.9*   Recent Labs    12/17/21 0442 12/18/21 0440  WBC 8.2 9.2  NEUTROABS 5.1 5.7  HGB 8.7* 10.3*  HCT 27.8* 34.4*  MCV 94.2 96.4  PLT 232 245   No results for input(s): LABPROT, INR in the last 72 hours.    Assessment Crohn's disease s/p ileostomy: High output ileostomy -Hgb 10.3 -No leukocytosis -Normal renal function -Ileostomy output: 2500 in 24 hours -Ultrasound showed no evidence of  acute cholecystitis   AKI   GERD   Plan: Continue current management, cholestyramine 4-3 times a day and Metamucil 1 packet daily as well as Imodium 2 mg 3 times daily Tolerating regular diet Can likely be discharged soon as she is doing well and tolerating diet as well as improved symptoms Will need outpatient follow-up with GI clinic for management of Crohn's disease. GI will follow  Garnette Scheuermann PA-C 12/18/2021, 11:20 AM  Contact #  [phone]

## 2021-12-19 ENCOUNTER — Other Ambulatory Visit (HOSPITAL_COMMUNITY): Payer: Self-pay

## 2021-12-19 DIAGNOSIS — Z932 Ileostomy status: Secondary | ICD-10-CM | POA: Diagnosis not present

## 2021-12-19 DIAGNOSIS — R198 Other specified symptoms and signs involving the digestive system and abdomen: Secondary | ICD-10-CM | POA: Diagnosis not present

## 2021-12-19 LAB — GLUCOSE, CAPILLARY
Glucose-Capillary: 84 mg/dL (ref 70–99)
Glucose-Capillary: 73 mg/dL (ref 70–99)
Glucose-Capillary: 95 mg/dL (ref 70–99)
Glucose-Capillary: 107 mg/dL — ABNORMAL HIGH (ref 70–99)
Glucose-Capillary: 112 mg/dL — ABNORMAL HIGH (ref 70–99)

## 2021-12-19 MED ORDER — FERROUS SULFATE 325 (65 FE) MG PO TABS
325.0000 mg | ORAL_TABLET | Freq: Two times a day (BID) | ORAL | 1 refills | Status: DC
  Filled 2021-12-19: qty 60, 30d supply, fill #0

## 2021-12-19 MED ORDER — LOPERAMIDE HCL 2 MG PO CAPS
2.0000 mg | ORAL_CAPSULE | Freq: Three times a day (TID) | ORAL | 3 refills | Status: DC | PRN
  Filled 2021-12-19: qty 30, 10d supply, fill #0

## 2021-12-19 MED ORDER — ALUM & MAG HYDROXIDE-SIMETH 200-200-20 MG/5ML PO SUSP
30.0000 mL | ORAL | 0 refills | Status: DC | PRN
  Filled 2021-12-19: qty 355, 2d supply, fill #0

## 2021-12-19 MED ORDER — CHOLESTYRAMINE LIGHT 4 G PO PACK
4.0000 g | PACK | Freq: Three times a day (TID) | ORAL | 1 refills | Status: DC
  Filled 2021-12-19: qty 29, 10d supply, fill #0

## 2021-12-19 MED ORDER — CALCIUM POLYCARBOPHIL 625 MG PO TABS
625.0000 mg | ORAL_TABLET | Freq: Two times a day (BID) | ORAL | 3 refills | Status: DC
  Filled 2021-12-19: qty 60, 30d supply, fill #0

## 2021-12-19 NOTE — Discharge Summary (Signed)
Physician Discharge Summary   Patient: Cynthia [name] MRN: [id number] DOB: 03-31-1961  Admit date:     12/12/2021  Discharge date: 12/19/21  Discharge Physician: Nita Sells   PCP: Denita Lung, MD   Recommendations at discharge:  Will require Chem-12 as well as magnesium, CBC in 1 week, consider getting iron levels in about 3 to 4 weeks All meds called into [facility] pharmacy and patient strongly encouraged to take all of the new meds for her underlying high output ostomy Return precautions stressed to patient  Discharge Diagnoses: Principal Problem:   High output ileostomy ([place]) Active Problems:   Gastroesophageal reflux disease   Benign essential HTN   Leukocytosis   AKI (acute kidney injury) ([place])   Hyponatremia   Abdominal pain   Hypotension  Resolved Problems:   * No resolved hospital problems. [facility] Course:  61 y.o. fanxiety, depression, bronchitis, GERD, migraine headaches, hiatal hernia, mitral valve prolapse, hypertension, IBS, digital nonintrusive session, restless leg syndrome,    Crohn's disease status post sigmoidectomy 02/2021 with Dr. Hilliard Clark at [facility] with multiple complications including prior SBO's in 01/2021, 01/30/2021 She was admitted by general surgery in the past also for AKI from high output ostomy-she was started on varied meds--unclear if compliant?   discharged in December after admission from 12/15-12/24 with high output from her--GI was consulted and patient was started on tincture of opium 6 4 times daily, Imodium 4 4 times daily, iron twice daily, FiberCon twice daily, Lomotil 2 4 times daily, cholestyramine 4 3 times daily and mesalamine was added-goal output recommended at the time was less than 1 L  Unclear whether the patient actually took any of these meds post that admission Lost to follow-up secondary to insurance issues Lost 120 pounds in 2 years Not on any Crohn's meds Last colonoscopy Cirigliano 10/20 =  Crohn's ileitis tubular adenomas   partial bowel resection with colostomy    Readmit 12/13/2021 nausea + lightheaded + fatigue + headache +?  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 at [facility] Defer to GI planning--current meds Imodium 2 mg 3 times daily as needed, Metamucil 1 packet daily, cholestyramine 4 g 3 times dailyIn addition to iron Inflammatory bowel process ruled out-lactoferrin negative-CT abdomen pelvis no pathology Ostomy nurse following-appreciate their input and management of the area locally Patient was discontinued off of saline and is tolerating full meals/liquids on discharge Mild hypoglycemia this morning 2/15 Improved-check sugars periodically today and then DC Needs to increase her diet--- resolved on discharge Unfounded concern for cholecystitis Ultrasound RUQ no evidence of cholecystitis only cholelithiasis--do not think this is pancreatitis-feel lipase is falsely elevated because of her altered GI anatomy/ostomy Defer to GI to have signed off AKI present on admission, with metabolic acidosis, mild Secondary to high output ostomy causing volume depletion/ATN Stabilized during hospitalization Output has decreased from her ostomy to about 350 cc Compliance was strongly reinforced to the patient as dictated previously Meds were called in for the meds to beds program Dilutional anemia secondary to high amounts of IV fluid Stabilized       Consultants: Gastroenterology Procedures performed: None Disposition: Home Diet recommendation:  Discharge Diet Orders (From admission, onward)     Start     Ordered   12/19/21 0000  Diet - low sodium heart healthy        12/19/21 1045           Regular diet  DISCHARGE  MEDICATION: Allergies as of 12/19/2021       Reactions   Iodine Other (See Comments)   The patient said she had a reaction from iodine in some eye drops- caused eyes to turn red;  pt has no reactions to CT contrast        Medication List     STOP taking these medications    cholestyramine 4 g packet Commonly known as: QUESTRAN Replaced by: cholestyramine light 4 g packet   furosemide 20 MG tablet Commonly known as: LASIX   ibuprofen 200 MG tablet Commonly known as: ADVIL   Zinc Oxide 12.8 % ointment Commonly known as: TRIPLE PASTE       TAKE these medications    acetaminophen 500 MG tablet Commonly known as: TYLENOL Take 1,000 mg by mouth every 6 (six) hours as needed for headache or mild pain.   alum & mag hydroxide-simeth 200-200-20 MG/5ML suspension Commonly known as: MAALOX/MYLANTA Take 30 mLs by mouth every 4 (four) hours as needed for indigestion.   cholestyramine light 4 g packet Commonly known as: PREVALITE Take 1 packet (4 g total) by mouth 3 (three) times daily.  **Mix as directed prior to taking** Replaces: cholestyramine 4 g packet   ferrous sulfate 325 (65 FE) MG tablet Take 1 tablet (325 mg total) by mouth 2 (two) times daily with a meal.   loperamide 2 MG capsule Commonly known as: IMODIUM Take 1 capsule (2 mg total) by mouth 3 (three) times daily as needed for diarrhea or loose stools.   mesalamine 250 MG CR capsule Commonly known as: PENTASA Take 4 capsules (1,000 mg total) by mouth 3 (three) times daily.   polycarbophil 625 MG tablet Commonly known as: FIBERCON Take 1 tablet (625 mg total) by mouth 2 (two) times daily.         Discharge Exam: Filed Weights   12/13/21 0129  Weight: 51.6 kg   EOMI NCAT no focal deficit CTA B no added sound no rales no rhonchi Abdomen soft no rebound no guarding with ostomy in place with a binder and about ROM intact Neurologically intact moving all 4 limbs equally--power is 5/5, reflexes are  2/3 Psych is euthymic coherent   Condition at discharge: fair  The results of significant diagnostics from this hospitalization (including imaging, microbiology, ancillary and laboratory) are listed below for reference.   Imaging Studies: CT ABDOMEN PELVIS WO CONTRAST  Result Date: 12/12/2021 CLINICAL DATA:  Right abdominal pain bowel obstruction. EXAM: CT ABDOMEN AND PELVIS WITHOUT CONTRAST TECHNIQUE: Multidetector CT imaging of the abdomen and pelvis was performed following the standard protocol without IV contrast. RADIATION DOSE REDUCTION: This exam was performed according to the departmental dose-optimization program which includes automated exposure control, adjustment of the mA and/or kV according to patient size and/or use of iterative reconstruction technique. COMPARISON:  10/24/2021 FINDINGS: Lower chest: No acute abnormality. Hepatobiliary: Cholelithiasis without pericholecystic inflammatory change identified. Liver unremarkable. No intra or extrahepatic biliary ductal dilation. Pancreas: Unremarkable Spleen: Unremarkable Adrenals/Urinary Tract: Adrenal glands are unremarkable. Kidneys are normal, without renal calculi, focal lesion, or hydronephrosis. Bladder is unremarkable. 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 The stomach, small bowel, and residual large bowel are unremarkable. No free  intraperitoneal gas or fluid. No evidence of obstruction or focal inflammation. Vascular/Lymphatic: Aortic atherosclerosis. No enlarged abdominal or pelvic lymph nodes. Reproductive: Status post hysterectomy. No adnexal masses. Other: No abdominal wall hernia. Musculoskeletal: Degenerative changes are seen within the lumbar spine. No lytic or blastic bone lesion. IMPRESSION: No acute intra-abdominal pathology identified. No definite radiographic explanation for  the patient's reported symptoms. 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 Cholelithiasis. Aortic Atherosclerosis (ICD10-I70.0). Electronically Signed   By: Fidela Salisbury M.D.   On: 12/12/2021 19:11   US Abdomen Limited RUQ (LIVER/GB)  Result Date: 12/16/2021 CLINICAL DATA:  Abdominal pain EXAM: ULTRASOUND ABDOMEN LIMITED RIGHT UPPER QUADRANT COMPARISON:  CT 12/12/2021 FINDINGS: Limited exam due to the patient's ileostomy bag. Gallbladder: Gallbladder is nondilated with multiple intraluminal stones, largest measuring 1.6 cm. No wall thickening or pericholecystic fluid. Negative sonographic Murphy sign. Common bile duct: Diameter: 2.1 mm Liver: No focal lesion identified. Within normal limits in parenchymal echogenicity. Portal vein is patent on color Doppler imaging with normal direction of blood flow towards the liver. Other: None. IMPRESSION: Cholelithiasis.  No evidence of acute cholecystitis. Electronically Signed   By: Maurine Simmering M.D.   On: 12/16/2021 17:00    Microbiology: Results for orders placed or performed during the hospital encounter of 12/12/21  Resp Panel by RT-PCR (Flu A&B, Covid) Peripheral     Status: None   Collection Time: 12/12/21  5:06 PM   Specimen: Peripheral; Nasopharyngeal(NP) swabs in vial transport medium  Result Value Ref Range Status   SARS Coronavirus 2 by RT PCR NEGATIVE NEGATIVE Final    Comment: (NOTE) SARS-CoV-2 target nucleic acids are NOT DETECTED.  The SARS-CoV-2 RNA is generally detectable in upper respiratory specimens during the acute phase of infection. The lowest concentration of SARS-CoV-2 viral copies this assay can detect is 138 copies/mL. A negative result does not preclude SARS-Cov-2 infection and should not be used as the sole basis for treatment or other patient management decisions. A negative result may occur with  improper specimen collection/handling, submission of specimen other than nasopharyngeal  swab, presence of viral mutation(s) within the areas targeted by this assay, and inadequate number of viral copies(<138 copies/mL). A negative result must be combined with clinical observations, patient history, and epidemiological information. The expected result is Negative.  Fact Sheet for Patients:  EntrepreneurPulse.com.au  Fact Sheet for Healthcare Providers:  IncredibleEmployment.be  This test is no t yet approved or cleared by the [place] FDA and  has been authorized for detection and/or diagnosis of SARS-CoV-2 by FDA under an Emergency Use Authorization (EUA). This EUA will remain  in effect (meaning this test can be used) for the duration of the COVID-19 declaration under Section 564(b)(1) of the Act, 21 U.S.C.section 360bbb-3(b)(1), unless the authorization is terminated  or revoked sooner.       Influenza A by PCR NEGATIVE NEGATIVE Final   Influenza B by PCR NEGATIVE NEGATIVE Final    Comment: (NOTE) The Xpert Xpress SARS-CoV-2/FLU/RSV plus assay is intended as an aid in the diagnosis of influenza from Nasopharyngeal swab specimens and should not be used as a sole basis for treatment. Nasal washings and aspirates are unacceptable for Xpert Xpress SARS-CoV-2/FLU/RSV testing.  Fact Sheet for Patients: EntrepreneurPulse.com.au  Fact Sheet for Healthcare Providers: IncredibleEmployment.be  This test is not yet approved or cleared by the [place] FDA and has been authorized for detection and/or diagnosis of SARS-CoV-2 by FDA under an Emergency Use Authorization (EUA). This EUA will  remain in effect (meaning this test can be used) for the duration of the COVID-19 declaration under Section 564(b)(1) of the Act, 21 U.S.C. section 360bbb-3(b)(1), unless the authorization is terminated or revoked.  Performed at [facility], [place] [address], [place], [place] [address]    Culture, blood (routine x 2)     Status: None   Collection Time: 12/12/21  5:07 PM   Specimen: BLOOD  Result Value Ref Range Status   Specimen Description   Final    BLOOD LEFT ANTECUBITAL Performed at [place] [address], [place], [place] [address]    Special Requests   Final    BOTTLES DRAWN AEROBIC AND ANAEROBIC Blood Culture adequate volume Performed at [place] [address] [place], [place] [address]    Culture   Final    NO GROWTH 5 DAYS Performed at [facility], [place] [address] [place], [name] [address]    Report Status 12/17/2021 FINAL  Final  Culture, blood (routine x 2)     Status: None   Collection Time: 12/12/21  5:23 PM   Specimen: BLOOD  Result Value Ref Range Status   Specimen Description   Final    BLOOD RIGHT ANTECUBITAL Performed at [place] [address], [place], [place] [address]    Special Requests   Final    BOTTLES DRAWN AEROBIC AND ANAEROBIC Blood Culture adequate volume Performed at [place] [address] [place], [place] [address]    Culture   Final    NO GROWTH 5 DAYS Performed at [facility], [place] [address] [place], [place] [address]    Report Status 12/17/2021 FINAL  Final    Labs: CBC: Recent Labs  Lab 12/12/21 1712 12/13/21 0423 12/14/21 0436 12/15/21 0436 12/16/21 0404 12/17/21 0442 12/18/21 0440  WBC 11.7* 15.0* 10.3 9.0 9.3 8.2 9.2  NEUTROABS 8.9* 10.9*  --   --   --  5.1 5.7  HGB 14.9 12.8 10.6* 9.6* 10.6* 8.7* 10.3*  HCT 45.0 39.5 33.2* 31.4* 33.8* 27.8* 34.4*  MCV 87.4 90.6 93.3 94.9 93.4 94.2 96.4  PLT 445* 331 285 269 198 232 812   Basic Metabolic Panel: Recent Labs  Lab 12/13/21 0423 12/14/21 0436 12/15/21 0436 12/16/21 0404 12/17/21 0442 12/18/21 0440  NA 130* 134* 135 137 136 136  K 3.9 3.7 4.0 4.5 4.4 4.4  CL 100 108 111 114* 113* 113*  CO2 19* 20* 18* 18* 18* 19*  GLUCOSE 104* 85 72  74 72 62*  BUN 74* 58* 35* 30* 27* 21  CREATININE 2.39* 1.93* 1.44* 0.98 1.19* 0.92  CALCIUM 8.7* 8.5* 8.4* 8.5* 8.4* 8.6*  MG 2.1  --   --   --  1.5* 1.6*  PHOS  --   --   --   --  2.6 2.1*   Liver Function Tests: Recent Labs  Lab 12/13/21 0423 12/15/21 0436 12/16/21 0404 12/17/21 0442 12/18/21 0440  AST 16 14* 15 15 12*  ALT 14 13 13 11 13   ALKPHOS 64 46 44 37* 43  BILITOT 0.1* 0.1* 0.2* 0.2* 0.1*  PROT 7.1 5.7* 5.6* 4.9* 5.5*  ALBUMIN 3.7 3.0* 2.9* 2.5* 2.9*   CBG: Recent Labs  Lab 12/18/21 1534 12/18/21 1934 12/18/21 2354 12/19/21 0344 12/19/21 0741  GLUCAP 111* 98 95 84 73    Discharge time spent: less than 30 minutes.  Signed: Nita Sells, MD [facility]  12/19/2021 ° ° ° ° ° ° ° ° °

## 2021-12-19 NOTE — Progress Notes (Signed)
Dr Verlon Au aware pt's ileostomy was putting out a lot more, in last hour 1500 ml. Md ordered to cancel dc, and he plans to call GI back on the case.

## 2021-12-20 DIAGNOSIS — R198 Other specified symptoms and signs involving the digestive system and abdomen: Secondary | ICD-10-CM | POA: Diagnosis not present

## 2021-12-20 DIAGNOSIS — Z932 Ileostomy status: Secondary | ICD-10-CM | POA: Diagnosis not present

## 2021-12-20 LAB — COMPREHENSIVE METABOLIC PANEL
ALT: 13 U/L (ref 0–44)
AST: 14 U/L — ABNORMAL LOW (ref 15–41)
Albumin: 2.6 g/dL — ABNORMAL LOW (ref 3.5–5.0)
Alkaline Phosphatase: 40 U/L (ref 38–126)
Anion gap: 3 — ABNORMAL LOW (ref 5–15)
BUN: 26 mg/dL — ABNORMAL HIGH (ref 8–23)
CO2: 23 mmol/L (ref 22–32)
Calcium: 8.6 mg/dL — ABNORMAL LOW (ref 8.9–10.3)
Chloride: 112 mmol/L — ABNORMAL HIGH (ref 98–111)
Creatinine, Ser: 1.16 mg/dL — ABNORMAL HIGH (ref 0.44–1.00)
GFR, Estimated: 53 mL/min — ABNORMAL LOW (ref >60)
Glucose, Bld: 77 mg/dL (ref 70–99)
Potassium: 4.7 mmol/L (ref 3.5–5.1)
Sodium: 138 mmol/L (ref 135–145)
Total Bilirubin: 0.1 mg/dL — ABNORMAL LOW (ref 0.3–1.2)
Total Protein: 5.3 g/dL — ABNORMAL LOW (ref 6.5–8.1)

## 2021-12-20 LAB — GLUCOSE, CAPILLARY
Glucose-Capillary: 102 mg/dL — ABNORMAL HIGH (ref 70–99)
Glucose-Capillary: 105 mg/dL — ABNORMAL HIGH (ref 70–99)
Glucose-Capillary: 109 mg/dL — ABNORMAL HIGH (ref 70–99)
Glucose-Capillary: 60 mg/dL — ABNORMAL LOW (ref 70–99)
Glucose-Capillary: 68 mg/dL — ABNORMAL LOW (ref 70–99)
Glucose-Capillary: 81 mg/dL (ref 70–99)
Glucose-Capillary: 91 mg/dL (ref 70–99)

## 2021-12-20 MED ORDER — ENSURE ENLIVE PO LIQD
237.0000 mL | Freq: Two times a day (BID) | ORAL | Status: DC
Start: 2021-12-20 — End: 2021-12-21
  Administered 2021-12-21 (×2): 237 mL via ORAL

## 2021-12-20 MED ORDER — ADULT MULTIVITAMIN W/MINERALS CH
1.0000 | ORAL_TABLET | Freq: Every day | ORAL | Status: DC
  Administered 2021-12-20 – 2021-12-27 (×8): 1 via ORAL
  Filled 2021-12-20 (×8): qty 1

## 2021-12-20 MED ORDER — DIPHENOXYLATE-ATROPINE 2.5-0.025 MG PO TABS
1.0000 | ORAL_TABLET | Freq: Four times a day (QID) | ORAL | Status: DC
  Administered 2021-12-20 – 2021-12-27 (×29): 1 via ORAL
  Filled 2021-12-20 (×28): qty 1

## 2021-12-20 NOTE — Discharge Instructions (Signed)
Nutrition Post Hospital Stay Proper nutrition can help your body recover from illness and injury.   Foods and beverages high in protein, vitamins, and minerals help rebuild muscle loss, promote healing, & reduce fall risk.   .In addition to eating healthy foods, a nutrition shake is an easy, delicious way to get the nutrition you need during and after your hospital stay  It is recommended that you continue to drink 2 bottles per day of:       Ensure for at least 1 month (30 days) after your hospital stay   Tips for adding a nutrition shake into your routine: As allowed, drink one with vitamins or medications instead of water or juice Enjoy one as a tasty mid-morning or afternoon snack Drink cold or make a milkshake out of it Drink one instead of milk with cereal or snacks Use as a coffee creamer   Available at the following grocery stores and pharmacies:           * [employer] * [employer] * [employer]  * [employer]          * [employer] * [employer]  * Wal[facility]reens      * Target  * [employer]J's   * [facility]  * [employer]   * [facility] [phone]            For COUPONS visit: [email or web address]/join or [email or web address]   Suggested Substitutions Ensure Plus = Boost Plus = Carnation Breakfast Essentials = Boost Compact Ensure Active Clear = Boost Breeze Glucerna Shake = Boost Glucose Control = Carnation Breakfast Essentials SUGAR FREE       

## 2021-12-20 NOTE — Progress Notes (Signed)
PROGRESS NOTE   Cynthia [name]  [name] DOB: [birth year] DOA: 12/12/2021 PCP: Denita Lung, MD  Brief Narrative:  61 y.o. fanxiety, depression, bronchitis, GERD, migraine headaches, hiatal hernia, mitral valve prolapse, hypertension, IBS, digital nonintrusive session, restless leg syndrome,   Crohn's disease status post sigmoidectomy 02/2021 with Dr. Hilliard Clark at [facility] with multiple complications including prior SBO's in 01/2021, 01/30/2021 She was admitted by general surgery in the past also for AKI from high output ostomy-she was started on varied meds--unclear if compliant?  discharged in December after admission from 12/15-12/24 with high output from her--GI was consulted and patient was started on tincture of opium 6 4 times daily, Imodium 4 4 times daily, iron twice daily, FiberCon twice daily, Lomotil 2 4 times daily, cholestyramine 4 3 times daily and mesalamine was added-goal output recommended at the time was less than 1 L  Unclear whether the patient actually took any of these meds post that admission Lost to follow-up secondary to insurance issues Lost 120 pounds in 2 years Not on any Crohn's meds Last colonoscopy Cirigliano 10/20 = Crohn's ileitis tubular adenomas  partial bowel resection with colostomy   Readmit 12/13/2021 nausea + lightheaded + fatigue + headache +?  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 at [facility] Defer to GI planning--current meds Imodium 2 mg 3 times daily as needed, Metamucil 1 packet daily, cholestyramine 4 g 3 times daily Patient was discharged on 2/16 but ultimately did not do well with high output again and therefore was kept in the hospital GI was reconsulted and recommended addition of  Lomotil 4 times daily tincture of opium? Inflammatory bowel  r/o-lactoferrin negative-CT abdomen pelvis no pathology Ostomy nurse following-appreciate their input and management of the area locally Resumed IV fluids at 200 cc/H D5 Mild hypoglycemia this morning 2/17 Secondary to poor p.o. intake sugar 68 keep fluids down as above Needs to increase her diet Unfounded concern for cholecystitis Ultrasound RUQ no evidence of cholecystitis only cholelithiasis--do not think this is pancreatitis-feel lipase is falsely elevated because of her altered GI anatomy/ostomy Defer to GI AKI present on admission Fluids as above  Wait her output to be less than about a liter before we can plan on discharge  DVT prophylaxis: Lovenox Code Status: Full Family Communication: None Disposition:  Status is: Inpatient Remains inpatient appropriate because:   Not ready for discharge    Consultants:  Gastroenterology  Procedures: n  Antimicrobials: n    Subjective:  Emotional today because of loss of her father No chest pain no fever No significant hypoglycemia today-but continues to have high output ostomy and [place] nurse will be seeing nurse had already taken measures however to manage this she seems stable hopefully for maceration of the skin doing fair doing fair   Objective: Vitals:   12/20/21 0500 12/20/21 0518 12/20/21 0725 12/20/21 1338  BP:  112/70  111/64  Pulse:  81  90  Resp:  17  14  Temp:  98.4 F (36.9 C)  98.5 F (36.9 C)  TempSrc:  Oral Oral Oral  SpO2:  100%  100%  Weight: 54.8 kg     Height:        Intake/Output Summary (Last 24 hours) at 12/20/2021 1349 Last data filed at 12/20/2021 1340 Gross  per 24 hour  Intake 1140 ml  Output 1650 ml  Net -510 ml    Filed Weights   12/13/21 0129 12/20/21 0500  Weight: 51.6 kg 54.8 kg    Examination:  EOMI NCAT no focal deficit CTA B no rales no rhonchi no wheezing Doing fair Abdomen soft no rebound no guarding--ostomy  in place output No lower extremity edema Ambulatory  Data Reviewed: personally reviewed   CBC    Component Value Date/Time   WBC 9.2 12/18/2021 0440   RBC 3.57 (L) 12/18/2021 0440   HGB 10.3 (L) 12/18/2021 0440   HGB 8.8 (L) 07/19/2021 1324   HCT 34.4 (L) 12/18/2021 0440   HCT 27.3 (L) 07/19/2021 1324   PLT 245 12/18/2021 0440   PLT 360 07/19/2021 1324   MCV 96.4 12/18/2021 0440   MCV 91 07/19/2021 1324   MCH 28.9 12/18/2021 0440   MCHC 29.9 (L) 12/18/2021 0440   RDW 13.3 12/18/2021 0440   RDW 12.5 07/19/2021 1324   LYMPHSABS 2.5 12/18/2021 0440   LYMPHSABS 2.1 07/19/2021 1324   MONOABS 0.5 12/18/2021 0440   EOSABS 0.4 12/18/2021 0440   EOSABS 0.2 07/19/2021 1324   BASOSABS 0.0 12/18/2021 0440   BASOSABS 0.0 07/19/2021 1324   CMP Latest Ref Rng & Units 12/20/2021 12/18/2021 12/17/2021  Glucose 70 - 99 mg/dL 77 62(L) 72  BUN 8 - 23 mg/dL 26(H) 21 27(H)  Creatinine 0.44 - 1.00 mg/dL 1.16(H) 0.92 1.19(H)  Sodium 135 - 145 mmol/L 138 136 136  Potassium 3.5 - 5.1 mmol/L 4.7 4.4 4.4  Chloride 98 - 111 mmol/L 112(H) 113(H) 113(H)  CO2 22 - 32 mmol/L 23 19(L) 18(L)  Calcium 8.9 - 10.3 mg/dL 8.6(L) 8.6(L) 8.4(L)  Total Protein 6.5 - 8.1 g/dL 5.3(L) 5.5(L) 4.9(L)  Total Bilirubin 0.3 - 1.2 mg/dL 0.1(L) 0.1(L) 0.2(L)  Alkaline Phos 38 - 126 U/L 40 43 37(L)  AST 15 - 41 U/L 14(L) 12(L) 15  ALT 0 - 44 U/L 13 13 11      Radiology Studies: No results found.   Scheduled Meds:  cholestyramine light  4 g Oral TID   diphenoxylate-atropine  1 tablet Oral QID   enoxaparin (LOVENOX) injection  40 mg Subcutaneous Daily   psyllium  1 packet Oral Daily   Continuous Infusions:  dextrose 5% lactated ringers 200 mL/hr at 12/20/21 1132     LOS: 7 days   Time spent: [place], MD [facility] To contact the attending provider between 7A-7P or the covering provider during after hours 7P-7A, please log into the web site [email or web address] and access using universal [facility] password for that web site. If you do not have the password, please call the hospital operator.  12/20/2021, 1:49 PM

## 2021-12-20 NOTE — Progress Notes (Signed)
Hypoglycemic Event  CBG: 60  RN notified. Pt provided OJ and graham crackers. Recheck in 15 minutes.

## 2021-12-20 NOTE — Progress Notes (Signed)
[facility] Gastroenterology Progress Note  Cynthia [name] JJOACZYS 61 y.o. January 13, 1961  CC:  Crohn's disease, s/p ileostomy   Subjective: Patient sitting on the edge of the bed this morning, tearful over the loss of her father last week.  She states she has emptied her ostomy bag 3 times in the last 2 hours.  Denies nausea, vomiting, fever, chills, abdominal pain, though she has had some intermittent cramping around ileostomy.  Denies dysuria.  She is tolerating her diet.   ROS : Review of Systems  Constitutional:  Negative for chills and fever.  Respiratory:  Negative for shortness of breath.   Cardiovascular:  Negative for chest pain.  Gastrointestinal:  Negative for abdominal pain, heartburn, nausea and vomiting.  Genitourinary:  Negative for dysuria and urgency.  Psychiatric/Behavioral:         Tearful over loss of her father last week     Objective: Vital signs in last 24 hours: Vitals:   12/19/21 2126 12/20/21 0518  BP: (!) 99/58 112/70  Pulse: 91 81  Resp: 16 17  Temp: 98.6 F (37 C) 98.4 F (36.9 C)  SpO2: 100% 100%    Physical Exam:  General:  Alert, cooperative, no distress, appears stated age  Head:  Normocephalic, without obvious abnormality, atraumatic  Eyes:  Anicteric sclera, EOM's intact  Lungs:   Clear to auscultation bilaterally, respirations unlabored  Heart:  Regular rate and rhythm, S1, S2 normal  Abdomen:   Soft, non-tender, bowel sounds active all four quadrants,  no masses, yellow-brown output from ileostomy   Extremities: Extremities normal, atraumatic, no  edema  Pulses: 2+ and symmetric    Lab Results: Recent Labs    12/18/21 0440 12/20/21 0426  NA 136 138  K 4.4 4.7  CL 113* 112*  CO2 19* 23  GLUCOSE 62* 77  BUN 21 26*  CREATININE 0.92 1.16*  CALCIUM 8.6* 8.6*  MG 1.6*  --   PHOS 2.1*  --    Recent Labs    12/18/21 0440 12/20/21 0426  AST 12* 14*  ALT 13 13  ALKPHOS 43 40  BILITOT 0.1* 0.1*  PROT 5.5* 5.3*  ALBUMIN 2.9* 2.6*    Recent Labs    12/18/21 0440  WBC 9.2  NEUTROABS 5.7  HGB 10.3*  HCT 34.4*  MCV 96.4  PLT 245   No results for input(s): LABPROT, INR in the last 72 hours.    Assessment Crohn's disease s/p ileostomy: High output ileostomy -Hgb 10.3 on 2/15 -No leukocytosis -BUN 26, Cr 1.16, GFR 53 -Ileostomy output: 1300 in 24 hours -Ultrasound showed no evidence of acute cholecystitis   AKI   GERD   Plan: Continue cholestyramine, imodium, Metamucil.  Can add Lomotil 2.5-0.025, 1 tablet QID. Tolerating regular diet Will need outpatient follow-up with GI clinic for management of Crohn's disease. GI will follow    Angelique Holm PA-C 12/20/2021, 12:31 PM  Contact #  [phone]

## 2021-12-20 NOTE — Progress Notes (Signed)
Initial Nutrition Assessment  DOCUMENTATION CODES:  Non-severe (moderate) malnutrition in context of chronic illness  INTERVENTION:  Continue regular diet.  Add Ensure Plus High Protein po BID, each supplement provides 350 kcal and 20 grams of protein.   Add MVI with minerals daily.  NUTRITION DIAGNOSIS:  Moderate Malnutrition related to chronic illness (recurring high output ileostomy) as evidenced by mild fat depletion, mild muscle depletion.  GOAL:  Patient will meet greater than or equal to 90% of their needs  MONITOR:  PO intake, Supplement acceptance, Labs, Weight trends, Skin, I & O's  REASON FOR ASSESSMENT:  Malnutrition Screening Tool    ASSESSMENT:  61 yo female with a PMH of anxiety, depression, bronchitis, GERD, migraine headaches, hiatal hernia, mitral valve prolapse, hypertension, IBS, digital nonintrusive session, restless leg syndrome, partial bowel resection with colostomy who is coming to the emergency department with abdominal pain in stoma site, occasional nausea, lightheadedness, fatigue, frontal headache, dehydration in the setting of recent increase in colostomy output.  Of note, she was discharged in December after admission from 12/15-12/24 with high output from her ostomy.  Spoke with pt at bedside as she was finishing lunch. Pt ate 95% of lunch per RD observation, only leaving a few potato wedges.  Pt reports her appetite has never been poor and she eats very well at home, eating whatever she likes. Pt eating mostly 100% of her meals (7 of last 8) while admitted, per Epic.  Pt reports losing 15 lbs in 2 weeks due to high output ileostomy.  Pt's weight appears to be around what she normally weighs per weight history in Epic - back around 54 kg.  Ileostomy Output: 1250 ml today so far 2/16: 2600 ml 2/15: 1350 ml 2/14: 2500 ml 2/13: 1050 ml 2/12: 1100 ml 2/11: 1560 ml  D5 @ 200 ml/hr provides 816 kcal/day of carbohydrates.  Pt reports being open  to drinking Ensure daily. RD to order BID. Requests chocolate flavor.  Medications: reviewed; Prevalite TID, Metamucil, D5 in LR @ 200 ml/hr   Labs: reviewed; CBG 68-112  NUTRITION - FOCUSED PHYSICAL EXAM: Flowsheet Row Most Recent Value  Orbital Region Mild depletion  Upper Arm Region Moderate depletion  Thoracic and Lumbar Region No depletion  Buccal Region Mild depletion  Temple Region Mild depletion  Clavicle Bone Region Mild depletion  Clavicle and Acromion Bone Region Mild depletion  Scapular Bone Region Mild depletion  Dorsal Hand Mild depletion  Patellar Region Mild depletion  Anterior Thigh Region Moderate depletion  Posterior Calf Region Mild depletion  Edema (RD Assessment) None  Hair Reviewed  Eyes Reviewed  Mouth Reviewed  Skin Reviewed  Nails Reviewed   Diet Order:   Diet Order             Diet - low sodium heart healthy           Diet regular Room service appropriate? Yes; Fluid consistency: Thin  Diet effective now                  EDUCATION NEEDS:  Education needs have been addressed  Skin:  Skin Assessment: Reviewed RN Assessment  Last BM:  12/20/21 - ileostomy  Height:  Ht Readings from Last 1 Encounters:  12/13/21 5\' 4"  (1.626 m)   Weight:  Wt Readings from Last 1 Encounters:  12/20/21 54.8 kg   BMI:  Body mass index is 20.74 kg/m.  Estimated Nutritional Needs:  Kcal:  2000-2200 Protein:  80-95 grams Fluid:  >2 L  Derrel Nip, RD, LDN (she/her/hers) Clinical Inpatient Dietitian RD Pager/After-Hours/Weekend Pager # in [place]

## 2021-12-21 DIAGNOSIS — R198 Other specified symptoms and signs involving the digestive system and abdomen: Secondary | ICD-10-CM | POA: Diagnosis not present

## 2021-12-21 DIAGNOSIS — Z932 Ileostomy status: Secondary | ICD-10-CM | POA: Diagnosis not present

## 2021-12-21 LAB — COMPREHENSIVE METABOLIC PANEL
ALT: 17 U/L (ref 0–44)
AST: 16 U/L (ref 15–41)
Albumin: 2.6 g/dL — ABNORMAL LOW (ref 3.5–5.0)
Alkaline Phosphatase: 40 U/L (ref 38–126)
Anion gap: 3 — ABNORMAL LOW (ref 5–15)
BUN: 25 mg/dL — ABNORMAL HIGH (ref 8–23)
CO2: 25 mmol/L (ref 22–32)
Calcium: 8.8 mg/dL — ABNORMAL LOW (ref 8.9–10.3)
Chloride: 110 mmol/L (ref 98–111)
Creatinine, Ser: 1.12 mg/dL — ABNORMAL HIGH (ref 0.44–1.00)
GFR, Estimated: 56 mL/min — ABNORMAL LOW (ref >60)
Glucose, Bld: 77 mg/dL (ref 70–99)
Potassium: 4.6 mmol/L (ref 3.5–5.1)
Sodium: 138 mmol/L (ref 135–145)
Total Bilirubin: 0.2 mg/dL — ABNORMAL LOW (ref 0.3–1.2)
Total Protein: 5.3 g/dL — ABNORMAL LOW (ref 6.5–8.1)

## 2021-12-21 LAB — GLUCOSE, CAPILLARY
Glucose-Capillary: 110 mg/dL — ABNORMAL HIGH (ref 70–99)
Glucose-Capillary: 69 mg/dL — ABNORMAL LOW (ref 70–99)
Glucose-Capillary: 72 mg/dL (ref 70–99)
Glucose-Capillary: 73 mg/dL (ref 70–99)
Glucose-Capillary: 75 mg/dL (ref 70–99)
Glucose-Capillary: 91 mg/dL (ref 70–99)
Glucose-Capillary: 94 mg/dL (ref 70–99)

## 2021-12-21 NOTE — Progress Notes (Signed)
PROGRESS NOTE   Cynthia [name]  INO:[id number] DOB: 1961/09/07 DOA: 12/12/2021 PCP: Denita Lung, MD  Brief Narrative:  61 y.o. fanxiety, depression, bronchitis, GERD, migraine headaches, hiatal hernia, mitral valve prolapse, hypertension, IBS, digital nonintrusive session, restless leg syndrome,   Crohn's disease status post sigmoidectomy 02/2021 with Dr. Hilliard Clark at [facility] with multiple complications including prior SBO's in 01/2021, 01/30/2021 She was admitted by general surgery in the past also for AKI from high output ostomy-she was started on varied meds--unclear if compliant?  discharged in December after admission from 12/15-12/24 with high output from her--GI was consulted and patient was started on tincture of opium 6 4 times daily, Imodium 4 4 times daily, iron twice daily, FiberCon twice daily, Lomotil 2 4 times daily, cholestyramine 4 3 times daily and mesalamine was added-goal output recommended at the time was less than 1 L  Unclear whether the patient actually took any of these meds post that admission Lost to follow-up secondary to insurance issues Lost 120 pounds in 2 years Not on any Crohn's meds Last colonoscopy Cirigliano 10/20 = Crohn's ileitis tubular adenomas  partial bowel resection with colostomy   Readmit 12/13/2021 nausea + lightheaded + fatigue + headache +?  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 CT abdomen pelvis no pathology Resumed IV fluids at 200 cc/H D5 Mild hypoglycemia this morning 2/17 Secondary to poor p.o. intake sugar 69 Keep on D5--cannot d/c unless output is improved Unfounded concern for cholecystitis Ultrasound RUQ no evidence of cholecystitis only cholelithiasis--do not think this is pancreatitis-feel lipase is falsely elevated because of her altered GI anatomy/ostomy AKI present on admission Fluids as above   DVT prophylaxis: Lovenox Code Status: Full Family Communication: None Disposition:  Status is: Inpatient Remains inpatient appropriate because:   Not ready for discharge    Consultants:  Gastroenterology  Procedures: n  Antimicrobials: n    Subjective:  Ostomy continues large amounts-nonoccluded area of the bag below the inferior aspect of the ostomy No chest pain no fever Somewhat emotional today    Objective: Vitals:   12/20/21 1338 12/20/21 2135 12/21/21 0634 12/21/21 1300  BP: 111/64 122/75 111/69 120/73  Pulse: 90 86 81 87  Resp: 14 18 18 20   Temp: 98.5 F (36.9 C) 98.5 F (36.9 C) 98 F (36.7 C) (!) 96.9 F (36.1 C)  TempSrc: Oral Oral Oral Axillary  SpO2: 100% 94% 100% 96%  Weight:      Height:        Intake/Output Summary (Last 24 hours) at 12/21/2021 1641 Last data filed at 12/21/2021 0900 Gross per 24 hour  Intake 2189.01 ml  Output 2300 ml  Net -110.99 ml    Filed Weights   12/13/21 0129 12/20/21 0500  Weight: 51.6 kg 54.8 kg    Examination:  EOMI NCAT no focal deficit CTA B no rales no rhonchi no wheezing Doing fair tearful at times Abdomen soft no rebound no guarding--ostomy in place but periostomy area below the ostomy seems to be coming off and she does have some maceration to the skin No lower extremity edema Ambulatory  Data  Reviewed: personally reviewed   CBC    Component Value Date/Time   WBC 9.2 12/18/2021 0440   RBC 3.57 (L) 12/18/2021 0440   HGB 10.3 (L) 12/18/2021 0440   HGB 8.8 (L) 07/19/2021 1324   HCT 34.4 (L) 12/18/2021 0440   HCT 27.3 (L) 07/19/2021 1324   PLT 245 12/18/2021 0440   PLT 360 07/19/2021 1324   MCV 96.4 12/18/2021 0440   MCV 91 07/19/2021 1324   MCH 28.9 12/18/2021 0440   MCHC 29.9 (L) 12/18/2021 0440   RDW 13.3 12/18/2021 0440   RDW 12.5 07/19/2021 1324   LYMPHSABS 2.5 12/18/2021 0440   LYMPHSABS 2.1 07/19/2021 1324   MONOABS 0.5 12/18/2021 0440   EOSABS 0.4 12/18/2021 0440   EOSABS 0.2 07/19/2021 1324   BASOSABS 0.0 12/18/2021 0440   BASOSABS 0.0 07/19/2021 1324   CMP Latest Ref Rng & Units 12/21/2021 12/20/2021 12/18/2021  Glucose 70 - 99 mg/dL 77 77 62(L)  BUN 8 - 23 mg/dL 25(H) 26(H) 21  Creatinine 0.44 - 1.00 mg/dL 1.12(H) 1.16(H) 0.92  Sodium 135 - 145 mmol/L 138 138 136  Potassium 3.5 - 5.1 mmol/L 4.6 4.7 4.4  Chloride 98 - 111 mmol/L 110 112(H) 113(H)  CO2 22 - 32 mmol/L 25 23 19(L)  Calcium 8.9 - 10.3 mg/dL 8.8(L) 8.6(L) 8.6(L)  Total Protein 6.5 - 8.1 g/dL 5.3(L) 5.3(L) 5.5(L)  Total Bilirubin 0.3 - 1.2 mg/dL 0.2(L) 0.1(L) 0.1(L)  Alkaline Phos 38 - 126 U/L 40 40 43  AST 15 - 41 U/L 16 14(L) 12(L)  ALT 0 - 44 U/L 17 13 13      Radiology Studies: No results found.   Scheduled Meds:  cholestyramine light  4 g Oral TID   diphenoxylate-atropine  1 tablet Oral QID   enoxaparin (LOVENOX) injection  40 mg Subcutaneous Daily   multivitamin with minerals  1 tablet Oral Daily   psyllium  1 packet Oral Daily   Continuous Infusions:  dextrose 5% lactated ringers 200 mL/hr at 12/20/21 1132     LOS: 8 days   Time spent: [place], MD [facility] To contact the attending provider between 7A-7P or the covering provider during after hours 7P-7A, please log into the web site [email or web address] and access using universal [place] password for  that web site. If you do not have the password, please call the hospital operator.  12/21/2021, 4:41 PM

## 2021-12-22 DIAGNOSIS — Z932 Ileostomy status: Secondary | ICD-10-CM | POA: Diagnosis not present

## 2021-12-22 DIAGNOSIS — R198 Other specified symptoms and signs involving the digestive system and abdomen: Secondary | ICD-10-CM | POA: Diagnosis not present

## 2021-12-22 LAB — GLUCOSE, CAPILLARY
Glucose-Capillary: 128 mg/dL — ABNORMAL HIGH (ref 70–99)
Glucose-Capillary: 60 mg/dL — ABNORMAL LOW (ref 70–99)
Glucose-Capillary: 68 mg/dL — ABNORMAL LOW (ref 70–99)
Glucose-Capillary: 78 mg/dL (ref 70–99)
Glucose-Capillary: 81 mg/dL (ref 70–99)
Glucose-Capillary: 85 mg/dL (ref 70–99)
Glucose-Capillary: 87 mg/dL (ref 70–99)
Glucose-Capillary: 91 mg/dL (ref 70–99)

## 2021-12-22 LAB — COMPREHENSIVE METABOLIC PANEL
ALT: 20 U/L (ref 0–44)
AST: 19 U/L (ref 15–41)
Albumin: 2.8 g/dL — ABNORMAL LOW (ref 3.5–5.0)
Alkaline Phosphatase: 42 U/L (ref 38–126)
Anion gap: 3 — ABNORMAL LOW (ref 5–15)
BUN: 32 mg/dL — ABNORMAL HIGH (ref 8–23)
CO2: 26 mmol/L (ref 22–32)
Calcium: 8.6 mg/dL — ABNORMAL LOW (ref 8.9–10.3)
Chloride: 107 mmol/L (ref 98–111)
Creatinine, Ser: 1.22 mg/dL — ABNORMAL HIGH (ref 0.44–1.00)
GFR, Estimated: 50 mL/min — ABNORMAL LOW (ref >60)
Glucose, Bld: 84 mg/dL (ref 70–99)
Potassium: 4.3 mmol/L (ref 3.5–5.1)
Sodium: 136 mmol/L (ref 135–145)
Total Bilirubin: 0.3 mg/dL (ref 0.3–1.2)
Total Protein: 5.6 g/dL — ABNORMAL LOW (ref 6.5–8.1)

## 2021-12-22 NOTE — Progress Notes (Signed)
PROGRESS NOTE   Cynthia [name]  QPR:[id number] DOB: 01-06-1961 DOA: 12/12/2021 PCP: Denita Lung, MD  Brief Narrative:   61 y.o. fanxiety, depression, bronchitis, GERD, migraine headaches, hiatal hernia, mitral valve prolapse, hypertension, IBS, digital nonintrusive session, restless leg syndrome,   Crohn's disease status post sigmoidectomy 02/2021 with Dr. Hilliard Clark at [facility] with multiple complications including prior SBO's in 01/2021, 01/30/2021 She was admitted by general surgery in the past also for AKI from high output ostomy-she was started on varied meds--unclear if compliant?  discharged in December after admission from 12/15-12/24 with high output from her--GI was consulted and patient was started on tincture of opium 6 4 times daily, Imodium 4 4 times daily, iron twice daily, FiberCon twice daily, Lomotil 2 4 times daily, cholestyramine 4 3 times daily and mesalamine was added-goal output recommended at the time was less than 1 L  Unclear whether the patient actually took any of these meds post that admission Lost to follow-up secondary to insurance issues Lost 120 pounds in 2 years Not on any Crohn's meds Last colonoscopy Cirigliano 10/20 = Crohn's ileitis tubular adenomas  partial bowel resection with colostomy   Readmit 12/13/2021 nausea + lightheaded + fatigue + headache +?  Dehydration--emptying ostomy bag hourly as opposed to 3-4 times a day Sodium 130, chloride 94, creatinine 3.6, lactic acid negative CT ABD = cholelithiasis (no cholecystitis) no other anomaly  Hospitalization complicated by hi output ostomy  + hypoglycemia making her discharge unsafe as she lives alone at home  Hospital-Problem based course  High output ileostomy dating back to index surgery 02/2021 at [facility] Patient was discharged on 2/16 but ultimately did not do well with high output again Ostomy better occluding now--OP decreasing slowly current meds Imodium 2 mg 3 times daily as  needed, Metamucil 1 packet daily, cholestyramine 4 g 3 times daily Lomotil 4 times daily improved CT abdomen pelvis no pathology Resumed IV fluids at 200 cc/H D5 AKI present on admission Fluids 200 cc/h--Force fluids--she needs to keep up with fluids to ensure volume depletion is better.  Rpt labs am Bladder scan 0 Mild hypoglycemia this morning 2/17 Secondary to poor p.o. intake sugar 69 Keep on D5--cannot d/c unless output is improved, and sugars are improved Unfounded concern for cholecystitis Ultrasound RUQ no evidence of cholecystitis only cholelithiasis--do not think this is pancreatitis-feel lipase is falsely elevated because of her altered GI anatomy/ostomy   DVT prophylaxis: Lovenox Code Status: Full Family Communication: None Disposition:  Status is: Inpatient Remains inpatient appropriate because:   Not ready for discharge    Consultants:  Gastroenterology  Procedures: n  Antimicrobials: n    Subjective:  Ostomy continues with some OP Is now 5L + No cp Ostomy is occluding fairly well    Objective: Vitals:   12/22/21 0231 12/22/21 0500 12/22/21 1157 12/22/21 1429  BP: 109/74   111/79  Pulse:    90  Resp: 20   17  Temp: 98 F (36.7 C)   97.8 F (36.6 C)  TempSrc: Oral  Oral   SpO2:    100%  Weight:  64.4 kg    Height:        Intake/Output Summary (Last 24 hours) at 12/22/2021 1709 Last data filed at 12/22/2021 1611 Gross per 24 hour  Intake 3356.27 ml  Output 1200 ml  Net 2156.27 ml    Filed Weights   12/13/21 0129 12/20/21 0500 12/22/21 0500  Weight: 51.6 kg 54.8 kg 64.4 kg  Examination:  EOMI NCAT no focal deficit  CTA B no rales no rhonchi no wheezing -ostomy in place but periostomy area is re-inforced No lower extremity edema Ambulatory  Data Reviewed: personally reviewed   CBC    Component Value Date/Time   WBC 9.2 12/18/2021 0440   RBC 3.57 (L) 12/18/2021 0440   HGB 10.3 (L) 12/18/2021 0440   HGB 8.8 (L) 07/19/2021  1324   HCT 34.4 (L) 12/18/2021 0440   HCT 27.3 (L) 07/19/2021 1324   PLT 245 12/18/2021 0440   PLT 360 07/19/2021 1324   MCV 96.4 12/18/2021 0440   MCV 91 07/19/2021 1324   MCH 28.9 12/18/2021 0440   MCHC 29.9 (L) 12/18/2021 0440   RDW 13.3 12/18/2021 0440   RDW 12.5 07/19/2021 1324   LYMPHSABS 2.5 12/18/2021 0440   LYMPHSABS 2.1 07/19/2021 1324   MONOABS 0.5 12/18/2021 0440   EOSABS 0.4 12/18/2021 0440   EOSABS 0.2 07/19/2021 1324   BASOSABS 0.0 12/18/2021 0440   BASOSABS 0.0 07/19/2021 1324   CMP Latest Ref Rng & Units 12/22/2021 12/21/2021 12/20/2021  Glucose 70 - 99 mg/dL 84 77 77  BUN 8 - 23 mg/dL 32(H) 25(H) 26(H)  Creatinine 0.44 - 1.00 mg/dL 1.22(H) 1.12(H) 1.16(H)  Sodium 135 - 145 mmol/L 136 138 138  Potassium 3.5 - 5.1 mmol/L 4.3 4.6 4.7  Chloride 98 - 111 mmol/L 107 110 112(H)  CO2 22 - 32 mmol/L 26 25 23   Calcium 8.9 - 10.3 mg/dL 8.6(L) 8.8(L) 8.6(L)  Total Protein 6.5 - 8.1 g/dL 5.6(L) 5.3(L) 5.3(L)  Total Bilirubin 0.3 - 1.2 mg/dL 0.3 0.2(L) 0.1(L)  Alkaline Phos 38 - 126 U/L 42 40 40  AST 15 - 41 U/L 19 16 14(L)  ALT 0 - 44 U/L 20 17 13      Radiology Studies: No results found.   Scheduled Meds:  cholestyramine light  4 g Oral TID   diphenoxylate-atropine  1 tablet Oral QID   enoxaparin (LOVENOX) injection  40 mg Subcutaneous Daily   multivitamin with minerals  1 tablet Oral Daily   psyllium  1 packet Oral Daily   Continuous Infusions:  dextrose 5% lactated ringers 200 mL/hr at 12/22/21 1259     LOS: 9 days   Time spent: [place], MD [facility] To contact the attending provider between 7A-7P or the covering provider during after hours 7P-7A, please log into the web site [email or web address] and access using universal [place] password for that web site. If you do not have the password, please call the hospital operator.  12/22/2021, 5:09 PM

## 2021-12-23 DIAGNOSIS — Z932 Ileostomy status: Secondary | ICD-10-CM | POA: Diagnosis not present

## 2021-12-23 DIAGNOSIS — E440 Moderate protein-calorie malnutrition: Secondary | ICD-10-CM | POA: Insufficient documentation

## 2021-12-23 DIAGNOSIS — R198 Other specified symptoms and signs involving the digestive system and abdomen: Secondary | ICD-10-CM | POA: Diagnosis not present

## 2021-12-23 LAB — GLUCOSE, CAPILLARY
Glucose-Capillary: 63 mg/dL — ABNORMAL LOW (ref 70–99)
Glucose-Capillary: 74 mg/dL (ref 70–99)
Glucose-Capillary: 79 mg/dL (ref 70–99)
Glucose-Capillary: 144 mg/dL — ABNORMAL HIGH (ref 70–99)
Glucose-Capillary: 148 mg/dL — ABNORMAL HIGH (ref 70–99)

## 2021-12-23 LAB — BASIC METABOLIC PANEL
Anion gap: 3 — ABNORMAL LOW (ref 5–15)
BUN: 28 mg/dL — ABNORMAL HIGH (ref 8–23)
CO2: 22 mmol/L (ref 22–32)
Calcium: 8.4 mg/dL — ABNORMAL LOW (ref 8.9–10.3)
Chloride: 111 mmol/L (ref 98–111)
Creatinine, Ser: 1.14 mg/dL — ABNORMAL HIGH (ref 0.44–1.00)
GFR, Estimated: 54 mL/min — ABNORMAL LOW (ref >60)
Glucose, Bld: 85 mg/dL (ref 70–99)
Potassium: 4.1 mmol/L (ref 3.5–5.1)
Sodium: 136 mmol/L (ref 135–145)

## 2021-12-23 MED ORDER — LOPERAMIDE HCL 2 MG PO CAPS
2.0000 mg | ORAL_CAPSULE | Freq: Four times a day (QID) | ORAL | Status: DC
  Administered 2021-12-23 – 2021-12-25 (×7): 2 mg via ORAL
  Filled 2021-12-23 (×7): qty 1

## 2021-12-23 MED ORDER — MELATONIN 3 MG PO TABS
3.0000 mg | ORAL_TABLET | Freq: Every day | ORAL | Status: DC
  Administered 2021-12-23 – 2021-12-26 (×4): 3 mg via ORAL
  Filled 2021-12-23 (×4): qty 1

## 2021-12-23 MED ORDER — OCTREOTIDE ACETATE 50 MCG/ML IJ SOLN
50.0000 ug | Freq: Three times a day (TID) | INTRAMUSCULAR | Status: DC
  Administered 2021-12-23 – 2021-12-25 (×5): 50 ug via SUBCUTANEOUS
  Filled 2021-12-23 (×9): qty 1

## 2021-12-23 NOTE — Consult Note (Addendum)
CC: High ileostomy output  Requesting physician: Dr. Verneita Griffes  HPI: Cynthia [name] is an 61 y.o. female with hx of Crohn's -as per chart review with Dr. Bubba Hales, diagnosed in 2020.  Back in 02/2021, she saw Dr. Hilliard Clark in his office.  At that time he noted she was on Stelara and prednisone.  Prednisone was noted to be 40 mg every day.  Previously been on Humira.  Colonoscopy previous was in 2020 that noted Crohn's disease in the terminal ileum.  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  She was taken to the OR with Dr. Hilliard Clark 02/19/2021 and underwent:  Exploratory laparotomy, ileocecectomy with end ileostomy, sigmoid colectomy with mucous fistula.  He noted significant Crohn's involving the terminal ileum with fistulas to loops of the distal ileum as well as the sigmoid colon.  Due to the inflammation in the pelvis and her chronic steroid use in addition to malnutrition, he elected not to perform a colorectal anastomosis and therefore brought up the mucous fistula of the distal descending colon.  Omentum was placed in the pelvis over the sigmoid stump.  The rectum itself was noted to be soft.  Ileum was brought up as an ileostomy.  She reports this was her first surgery for Crohn's disease.  She reports that this was the only surgery she had on her abdomen aside from hysterectomy.  She has struggled postoperatively with multiple partial small bowel obstruction type pictures as well as high ileostomy output.  She was ultimately on TPN.  She has been in and out of the hospital with high ileostomy output.  She reports she has limited resources and is unable to take Imodium as an outpatient.  She will therefore be readmitted with significant dehydration.  Her ostomy output does appear to be controlled on antidiarrheals when she takes these.  Currently denies fever/chills/nausea/vomiting.  She reports generalized pain  all over her body.  She denies any specific pain that is any different than in the prior weeks or months and it proceeded today.  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[facility])    MVP (mitral valve prolapse)    RLS (restless legs syndrome)    Tubular adenoma of colon 08/2019    Past Surgical History:  Procedure Laterality Date   ABDOMINAL HYSTERECTOMY  06/17/2011   Procedure: HYSTERECTOMY ABDOMINAL;  Surgeon: Arloa Koh;  Location: [facility];  Service: Gynecology;  Laterality: N/A;  Converted to Abdominal Hysterectomy with lysis of adhesions    ANTERIOR AND POSTERIOR REPAIR  02/25/2012   Procedure: ANTERIOR (CYSTOCELE) AND POSTERIOR REPAIR (RECTOCELE);  Surgeon: Daria Pastures, MD;  Location: [facility];  Service: Gynecology;  Laterality: N/A;  ANterior cystocele repair ONLY   BIOPSY  08/29/2019   Procedure: BIOPSY;  Surgeon: Lavena Bullion, DO;  Location: [facility];  Service: Gastroenterology;;   BLADDER SUSPENSION  02/25/2012   Procedure: TRANSVAGINAL TAPE (TVT) PROCEDURE;  Surgeon: Daria Pastures, MD;  Location: [facility];  Service: Gynecology;  Laterality: N/A;   COLONOSCOPY WITH PROPOFOL N/A  08/29/2019   Procedure: COLONOSCOPY WITH PROPOFOL;  Surgeon: Lavena Bullion, DO;  Location: [place];  Service: Gastroenterology;  Laterality: N/A;   CYSTOSCOPY  02/25/2012   Procedure: CYSTOSCOPY;  Surgeon: Daria Pastures, MD;  Location: [facility];  Service: Gynecology;  Laterality: N/A;   ILEOSTOMY     KNEE ARTHROSCOPY  2010   LAPAROSCOPIC ASSISTED VAGINAL HYSTERECTOMY  06/17/2011   Procedure: LAPAROSCOPIC ASSISTED VAGINAL HYSTERECTOMY;  Surgeon: Arloa Koh;  Location: [facility];  Service: Gynecology;   Laterality: N/A;  Attempted    POLYPECTOMY  08/29/2019   Procedure: POLYPECTOMY;  Surgeon: Lavena Bullion, DO;  Location: [facility];  Service: Gastroenterology;;   SALPINGOOPHORECTOMY  06/17/2011   Procedure: SALPINGO OOPHERECTOMY;  Surgeon: Arloa Koh;  Location: [facility];  Service: Gynecology;  Laterality: Bilateral;   TONSILLECTOMY      Family History  Problem Relation Age of Onset   Diabetes Mother    Dementia Mother    Clotting disorder Father    Macular degeneration Father    Crohn's disease Sister    Colon cancer Neg Hx    Esophageal cancer Neg Hx    Pancreatic cancer Neg Hx    Stomach cancer Neg Hx    Liver disease Neg Hx     Social:  reports that she has quit smoking. Her smoking use included cigarettes. She has never used smokeless tobacco. She reports that she does not currently use alcohol. She reports that she does not currently use drugs after having used the following drugs: Cocaine and Marijuana.  Allergies:  Allergies  Allergen Reactions   Iodine Other (See Comments)    The patient said she had a reaction from iodine in some eye drops- caused eyes to turn red;  pt has no reactions to CT contrast    Medications: I have reviewed the patient's current medications.  Results for orders placed or performed during the hospital encounter of 12/12/21 (from the past 48 hour(s))  Glucose, capillary     Status: None   Collection Time: 12/21/21 11:21 PM  Result Value Ref Range   Glucose-Capillary 73 70 - 99 mg/dL    Comment: Glucose reference range applies only to samples taken after fasting for at least 8 hours.  Glucose, capillary     Status: None   Collection Time: 12/21/21 11:43 PM  Result Value Ref Range   Glucose-Capillary 75 70 - 99 mg/dL    Comment: Glucose reference range applies only to samples taken after fasting for at least 8 hours.  Comprehensive metabolic panel     Status: Abnormal   Collection Time: 12/22/21  4:14 AM  Result Value Ref Range    Sodium 136 135 - 145 mmol/L   Potassium 4.3 3.5 - 5.1 mmol/L   Chloride 107 98 - 111 mmol/L   CO2 26 22 - 32 mmol/L   Glucose, Bld 84 70 - 99 mg/dL    Comment: Glucose reference range applies only to samples taken after fasting for at least 8 hours.   BUN 32 (H) 8 - 23 mg/dL   Creatinine, Ser 1.22 (H) 0.44 - 1.00 mg/dL   Calcium 8.6 (L) 8.9 - 10.3 mg/dL   Total Protein 5.6 (L) 6.5 - 8.1 g/dL   Albumin 2.8 (L) 3.5 - 5.0 g/dL   AST 19 15 - 41 U/L   ALT 20 0 - 44 U/L   Alkaline Phosphatase 42 38 - 126 U/L   Total Bilirubin 0.3 0.3 - 1.2 mg/dL  GFR, Estimated 50 (L) >60 mL/min    Comment: (NOTE) Calculated using the CKD-EPI Creatinine Equation (2021)    Anion gap 3 (L) 5 - 15    Comment: Performed at [facility], [place] [address], [place], [place] [address]  Glucose, capillary     Status: None   Collection Time: 12/22/21  4:28 AM  Result Value Ref Range   Glucose-Capillary 81 70 - 99 mg/dL    Comment: Glucose reference range applies only to samples taken after fasting for at least 8 hours.  Glucose, capillary     Status: None   Collection Time: 12/22/21  7:14 AM  Result Value Ref Range   Glucose-Capillary 78 70 - 99 mg/dL    Comment: Glucose reference range applies only to samples taken after fasting for at least 8 hours.  Glucose, capillary     Status: Abnormal   Collection Time: 12/22/21 11:24 AM  Result Value Ref Range   Glucose-Capillary 60 (L) 70 - 99 mg/dL    Comment: Glucose reference range applies only to samples taken after fasting for at least 8 hours.  Glucose, capillary     Status: Abnormal   Collection Time: 12/22/21 11:44 AM  Result Value Ref Range   Glucose-Capillary 68 (L) 70 - 99 mg/dL    Comment: Glucose reference range applies only to samples taken after fasting for at least 8 hours.  Glucose, capillary     Status: None   Collection Time: 12/22/21 12:05 PM  Result Value Ref Range   Glucose-Capillary 91 70 - 99 mg/dL    Comment: Glucose  reference range applies only to samples taken after fasting for at least 8 hours.  Glucose, capillary     Status: Abnormal   Collection Time: 12/22/21  4:02 PM  Result Value Ref Range   Glucose-Capillary 128 (H) 70 - 99 mg/dL    Comment: Glucose reference range applies only to samples taken after fasting for at least 8 hours.  Glucose, capillary     Status: None   Collection Time: 12/22/21  7:59 PM  Result Value Ref Range   Glucose-Capillary 87 70 - 99 mg/dL    Comment: Glucose reference range applies only to samples taken after fasting for at least 8 hours.  Glucose, capillary     Status: None   Collection Time: 12/22/21 11:43 PM  Result Value Ref Range   Glucose-Capillary 85 70 - 99 mg/dL    Comment: Glucose reference range applies only to samples taken after fasting for at least 8 hours.  Glucose, capillary     Status: Abnormal   Collection Time: 12/23/21  4:30 AM  Result Value Ref Range   Glucose-Capillary 63 (L) 70 - 99 mg/dL    Comment: Glucose reference range applies only to samples taken after fasting for at least 8 hours.  Glucose, capillary     Status: None   Collection Time: 12/23/21  7:35 AM  Result Value Ref Range   Glucose-Capillary 74 70 - 99 mg/dL    Comment: Glucose reference range applies only to samples taken after fasting for at least 8 hours.  Basic metabolic panel     Status: Abnormal   Collection Time: 12/23/21  7:45 AM  Result Value Ref Range   Sodium 136 135 - 145 mmol/L   Potassium 4.1 3.5 - 5.1 mmol/L   Chloride 111 98 - 111 mmol/L   CO2 22 22 - 32 mmol/L   Glucose, Bld 85 70 - 99 mg/dL  Comment: Glucose reference range applies only to samples taken after fasting for at least 8 hours.   BUN 28 (H) 8 - 23 mg/dL   Creatinine, Ser 1.14 (H) 0.44 - 1.00 mg/dL   Calcium 8.4 (L) 8.9 - 10.3 mg/dL   GFR, Estimated 54 (L) >60 mL/min    Comment: (NOTE) Calculated using the CKD-EPI Creatinine Equation (2021)    Anion gap 3 (L) 5 - 15    Comment: Performed  at [facility], [place] [address], [place], [place] [address]  Glucose, capillary     Status: None   Collection Time: 12/23/21 11:22 AM  Result Value Ref Range   Glucose-Capillary 79 70 - 99 mg/dL    Comment: Glucose reference range applies only to samples taken after fasting for at least 8 hours.  Glucose, capillary     Status: Abnormal   Collection Time: 12/23/21  4:03 PM  Result Value Ref Range   Glucose-Capillary 144 (H) 70 - 99 mg/dL    Comment: Glucose reference range applies only to samples taken after fasting for at least 8 hours.  Glucose, capillary     Status: Abnormal   Collection Time: 12/23/21  7:41 PM  Result Value Ref Range   Glucose-Capillary 148 (H) 70 - 99 mg/dL    Comment: Glucose reference range applies only to samples taken after fasting for at least 8 hours.    No results found.  ROS - all of the below systems have been reviewed with the patient and positives are indicated with bold text General: chills, fever or night sweats Eyes: blurry vision or double vision ENT: epistaxis or sore throat Allergy/Immunology: itchy/watery eyes or nasal congestion Hematologic/Lymphatic: bleeding problems, blood clots or swollen lymph nodes Endocrine: temperature intolerance or unexpected weight changes Breast: new or changing breast lumps or nipple discharge Resp: cough, shortness of breath, or wheezing CV: chest pain or dyspnea on exertion (chronic) GI: as per HPI GU: dysuria, trouble voiding, or hematuria MSK: joint pain or joint stiffness Neuro: TIA or stroke symptoms Derm: pruritus and skin lesion changes Psych: anxiety and depression  PE Blood pressure 106/63, pulse 85, temperature 97.8 F (36.6 C), resp. rate 15, height 5' 4"  (1.626 m), weight 65.5 kg, last menstrual period 06/10/2011, SpO2 100 %. Constitutional: NAD; conversant; no deformities Eyes: Moist conjunctiva; no lid lag; anicteric; PERRL Neck: Trachea midline; no thyromegaly Lungs:  Normal respiratory effort; no tactile fremitus CV: RRR; no palpable thrills; no pitting edema GI: Abd soft, NT/ND; ileostomy pink; mucus fistula dressed, dry; no palpable hepatosplenomegaly MSK: Normal range of motion of extremities; no clubbing/cyanosis Psychiatric: Appropriate affect; alert and oriented x3 Lymphatic: No palpable cervical or axillary lymphadenopathy  Results for orders placed or performed during the hospital encounter of 12/12/21 (from the past 48 hour(s))  Glucose, capillary     Status: None   Collection Time: 12/21/21 11:21 PM  Result Value Ref Range   Glucose-Capillary 73 70 - 99 mg/dL    Comment: Glucose reference range applies only to samples taken after fasting for at least 8 hours.  Glucose, capillary     Status: None   Collection Time: 12/21/21 11:43 PM  Result Value Ref Range   Glucose-Capillary 75 70 - 99 mg/dL    Comment: Glucose reference range applies only to samples taken after fasting for at least 8 hours.  Comprehensive metabolic panel     Status: Abnormal   Collection Time: 12/22/21  4:14 AM  Result Value Ref Range  Sodium 136 135 - 145 mmol/L   Potassium 4.3 3.5 - 5.1 mmol/L   Chloride 107 98 - 111 mmol/L   CO2 26 22 - 32 mmol/L   Glucose, Bld 84 70 - 99 mg/dL    Comment: Glucose reference range applies only to samples taken after fasting for at least 8 hours.   BUN 32 (H) 8 - 23 mg/dL   Creatinine, Ser 1.22 (H) 0.44 - 1.00 mg/dL   Calcium 8.6 (L) 8.9 - 10.3 mg/dL   Total Protein 5.6 (L) 6.5 - 8.1 g/dL   Albumin 2.8 (L) 3.5 - 5.0 g/dL   AST 19 15 - 41 U/L   ALT 20 0 - 44 U/L   Alkaline Phosphatase 42 38 - 126 U/L   Total Bilirubin 0.3 0.3 - 1.2 mg/dL   GFR, Estimated 50 (L) >60 mL/min    Comment: (NOTE) Calculated using the CKD-EPI Creatinine Equation (2021)    Anion gap 3 (L) 5 - 15    Comment: Performed at [facility], [place] [address], [place], [place] [address]  Glucose, capillary     Status: None   Collection Time:  12/22/21  4:28 AM  Result Value Ref Range   Glucose-Capillary 81 70 - 99 mg/dL    Comment: Glucose reference range applies only to samples taken after fasting for at least 8 hours.  Glucose, capillary     Status: None   Collection Time: 12/22/21  7:14 AM  Result Value Ref Range   Glucose-Capillary 78 70 - 99 mg/dL    Comment: Glucose reference range applies only to samples taken after fasting for at least 8 hours.  Glucose, capillary     Status: Abnormal   Collection Time: 12/22/21 11:24 AM  Result Value Ref Range   Glucose-Capillary 60 (L) 70 - 99 mg/dL    Comment: Glucose reference range applies only to samples taken after fasting for at least 8 hours.  Glucose, capillary     Status: Abnormal   Collection Time: 12/22/21 11:44 AM  Result Value Ref Range   Glucose-Capillary 68 (L) 70 - 99 mg/dL    Comment: Glucose reference range applies only to samples taken after fasting for at least 8 hours.  Glucose, capillary     Status: None   Collection Time: 12/22/21 12:05 PM  Result Value Ref Range   Glucose-Capillary 91 70 - 99 mg/dL    Comment: Glucose reference range applies only to samples taken after fasting for at least 8 hours.  Glucose, capillary     Status: Abnormal   Collection Time: 12/22/21  4:02 PM  Result Value Ref Range   Glucose-Capillary 128 (H) 70 - 99 mg/dL    Comment: Glucose reference range applies only to samples taken after fasting for at least 8 hours.  Glucose, capillary     Status: None   Collection Time: 12/22/21  7:59 PM  Result Value Ref Range   Glucose-Capillary 87 70 - 99 mg/dL    Comment: Glucose reference range applies only to samples taken after fasting for at least 8 hours.  Glucose, capillary     Status: None   Collection Time: 12/22/21 11:43 PM  Result Value Ref Range   Glucose-Capillary 85 70 - 99 mg/dL    Comment: Glucose reference range applies only to samples taken after fasting for at least 8 hours.  Glucose, capillary     Status: Abnormal    Collection Time: 12/23/21  4:30 AM  Result Value Ref  Range   Glucose-Capillary 63 (L) 70 - 99 mg/dL    Comment: Glucose reference range applies only to samples taken after fasting for at least 8 hours.  Glucose, capillary     Status: None   Collection Time: 12/23/21  7:35 AM  Result Value Ref Range   Glucose-Capillary 74 70 - 99 mg/dL    Comment: Glucose reference range applies only to samples taken after fasting for at least 8 hours.  Basic metabolic panel     Status: Abnormal   Collection Time: 12/23/21  7:45 AM  Result Value Ref Range   Sodium 136 135 - 145 mmol/L   Potassium 4.1 3.5 - 5.1 mmol/L   Chloride 111 98 - 111 mmol/L   CO2 22 22 - 32 mmol/L   Glucose, Bld 85 70 - 99 mg/dL    Comment: Glucose reference range applies only to samples taken after fasting for at least 8 hours.   BUN 28 (H) 8 - 23 mg/dL   Creatinine, Ser 1.14 (H) 0.44 - 1.00 mg/dL   Calcium 8.4 (L) 8.9 - 10.3 mg/dL   GFR, Estimated 54 (L) >60 mL/min    Comment: (NOTE) Calculated using the CKD-EPI Creatinine Equation (2021)    Anion gap 3 (L) 5 - 15    Comment: Performed at [facility], [place] [address] [place], [place] [address]  Glucose, capillary     Status: None   Collection Time: 12/23/21 11:22 AM  Result Value Ref Range   Glucose-Capillary 79 70 - 99 mg/dL    Comment: Glucose reference range applies only to samples taken after fasting for at least 8 hours.  Glucose, capillary     Status: Abnormal   Collection Time: 12/23/21  4:03 PM  Result Value Ref Range   Glucose-Capillary 144 (H) 70 - 99 mg/dL    Comment: Glucose reference range applies only to samples taken after fasting for at least 8 hours.  Glucose, capillary     Status: Abnormal   Collection Time: 12/23/21  7:41 PM  Result Value Ref Range   Glucose-Capillary 148 (H) 70 - 99 mg/dL    Comment: Glucose reference range applies only to samples taken after fasting for at least 8 hours.    No results found.  I have  personally reviewed the relevant op notes from Dr. Hilliard Clark, [facility] records in [facility]  A/P: Cynthia [name] is an 61 y.o. 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  Underwent ex-lap/ileocecectomy and sigmoid colectomy; descending colon mucous fistula; 02/2021 at [facility] for scheduled Imodium 2 mg 4 times daily, she is also currently on Lomotil 4 times daily.  Continue fiber supplementation to help thicken stool.  Maintenance IV fluids/supportive fluids.   -Last albumin 2.8; recheck prealbumin - last was 10/23/21 and was noted to be 20. May need calorie count +- TPN -GI for evaluation of both rectum/remnant sigmoid as well as the rest of colon through mucus fistula  I spent a total of 90 minutes in both face-to-face and non-face-to-face activities, excluding procedures performed, for this H&P on the date of this encounter.  Nadeen Landau, MD [facility], [facility]

## 2021-12-23 NOTE — Progress Notes (Signed)
[facility] Gastroenterology Progress Note  Cynthia [name] [id number] 61 y.o. [birth year]  CC: Crohn's disease, s/p ileostomy, high output ileostomy   Subjective: We were reconsulted on this patient for high output ileostomy.  1350 mL over the last 24 hours.  Patient states she has changed her bag 4 times this morning.  Symptoms are mainly liquid.  Denies abdominal pain.  Denies nausea/vomiting.  ROS : Review of Systems  Gastrointestinal:  Positive for diarrhea. Negative for abdominal pain, blood in stool, constipation, heartburn, melena, nausea and vomiting.  Genitourinary:  Negative for dysuria and urgency.     Objective: Vital signs in last 24 hours: Vitals:   12/22/21 2010 12/23/21 0432  BP: 98/60 117/73  Pulse: 87 80  Resp: 16 16  Temp: 98.9 F (37.2 C) 98.7 F (37.1 C)  SpO2: 100% 100%    Physical Exam:  General:  Alert, cooperative, no distress, appears stated age  Head:  Normocephalic, without obvious abnormality, atraumatic  Eyes:  Anicteric sclera, EOM's intact  Lungs:   Clear to auscultation bilaterally, respirations unlabored  Heart:  Regular rate and rhythm, S1, S2 normal  Abdomen:   Soft, non-tender, bowel sounds active all four quadrants, ileostomy present with light brown/yellow liquid    Lab Results: Recent Labs    12/22/21 0414 12/23/21 0745  NA 136 136  K 4.3 4.1  CL 107 111  CO2 26 22  GLUCOSE 84 85  BUN 32* 28*  CREATININE 1.22* 1.14*  CALCIUM 8.6* 8.4*   Recent Labs    12/21/21 0529 12/22/21 0414  AST 16 19  ALT 17 20  ALKPHOS 40 42  BILITOT 0.2* 0.3  PROT 5.3* 5.6*  ALBUMIN 2.6* 2.8*   No results for input(s): WBC, NEUTROABS, HGB, HCT, MCV, PLT in the last 72 hours. No results for input(s): LABPROT, INR in the last 72 hours.    Assessment High output ileostomy -Ileostomy s/p 02/2021 -1350 mL in the last 24 hours -BUN 28, creatinine 1.14 -No leukocytosis   Plan: Per intake/output chart, 1350 mL in the last 24 hours.  Amount is  consistent, if not less than output documented previous days during her hospital stay.  Was previously on Lomotil 4 tablets daily without much improvement. We will consider putting her on Sandostatin 50 mcg SQ every 8 hours and see how she does with that. Continue supportive care [facility] will follow  Garnette Scheuermann PA-C 12/23/2021, 12:01 PM  Contact #  [phone]

## 2021-12-23 NOTE — Consult Note (Signed)
[place] Nurse ostomy consult note Stoma type/location: RLQ, end ileostomy  Stomal assessment/size: 1 1/2" round, pink, slightly prolapse Peristomal assessment: denudation Treatment options for stomal/peristomal skin: crusted skin with ostomy powder using no-sting skin barrier wipes to  top powder. Patient has and is aware of this technique for treating her  Output liquid yellow; seedy Ostomy pouching: 2pc. 2 3/4" high output hooked to bedside drainage bag  Education provided:  Discussed with patient home supplies; she has not contacted [facility], this is the only [facility] provider. She runs out of supplies and reuses things and goes to local DME and tries to get supplies. She is picking at her skin barrier shortly after I leave the room and return. I have stressed the importance of adequate supplies, emptying the pouch, treatment of her skin, hydration.  Enrolled patient in [place] program: Yes previously  I have completed a ostomy supply order form today and Dr. Verlon Au will sign off to be faxed to [place], [place], [place]

## 2021-12-23 NOTE — Progress Notes (Signed)
PROGRESS NOTE   Cynthia [name]  UTM:[id number] DOB: 01/23/1961 DOA: 12/12/2021 PCP: Denita Lung, MD  Brief Narrative:   61 y.o. fanxiety, depression, bronchitis, GERD, migraine headaches, hiatal hernia, mitral valve prolapse, hypertension, IBS, digital nonintrusive session, restless leg syndrome,   Crohn's disease status post sigmoidectomy 02/2021 with Dr. Hilliard Clark at [facility] with multiple complications including prior SBO's in 01/2021, 01/30/2021 She was admitted by general surgery in the past also for AKI from high output ostomy-she was started on varied meds--unclear if compliant?  discharged in December after admission from 12/15-12/24 with high output from her--GI was consulted and patient was started on tincture of opium 6 4 times daily, Imodium 4 4 times daily, iron twice daily, FiberCon twice daily, Lomotil 2 4 times daily, cholestyramine 4 3 times daily and mesalamine was added-goal output recommended at the time was less than 1 L  Unclear whether the patient actually took any of these meds post that admission Lost to follow-up secondary to insurance issues Lost 120 pounds in 2 years Not on any Crohn's meds Last colonoscopy Cirigliano 10/20 = Crohn's ileitis tubular adenomas  partial bowel resection with colostomy   Readmit 12/13/2021 nausea + lightheaded + fatigue + headache +?  Dehydration--emptying ostomy bag hourly as opposed to 3-4 times a day Sodium 130, chloride 94, creatinine 3.6, lactic acid negative CT ABD = cholelithiasis (no cholecystitis) no other anomaly  Hospitalization complicated by hi output ostomy  + hypoglycemia making her discharge unsafe as she lives alone at home  Hospital-Problem based course  High output ileostomy dating back to index surgery 02/2021 at [facility] Patient was discharged on 2/16 but ultimately did not do well with high output again Ostomy better occluding now--OP decreasing slowly current meds Imodium 2 mg 3 times daily as  needed, Metamucil 1 packet daily, cholestyramine 4 g 3 times daily Lomotil 4 times daily Patient has minimally improved-I think this is a waxing waning issue with high risk for readmission-- appreciate re-engagement of GI -we are starting Sandostatin per GI Gen surg will be contacted in am IV fluids at  100 cc/H D5 AKI present on admission force oral fluids--she needs to keep up with fluids to ensure volume depletion is better.  Rpt labs am Bladder scan 0 Mild hypoglycemia this morning 2/17 Secondary to poor p.o. intake sugar 74--had mild headache Graduate diet as able Unfounded concern for cholecystitis Ultrasound RUQ no evidence of cholecystitis only cholelithiasis--do not think this is pancreatitis-feel lipase is falsely elevated because of her altered GI anatomy/ostomy   DVT prophylaxis: Lovenox Code Status: Full Family Communication: None Disposition:  Status is: Inpatient Remains inpatient appropriate because:   Not ready for discharge    Consultants:  Gastroenterology  Procedures: n  Antimicrobials: n    Subjective:  Ostomy continues relatively high output-patient has put out 5 bag changes by midday and I really appreciate ostomy nurse input We signed an order for ostomy management system via [facility] at home Based on her recommendations   Objective: Vitals:   12/22/21 2010 12/23/21 0432 12/23/21 0500 12/23/21 1332  BP: 98/60 117/73  106/63  Pulse: 87 80  85  Resp: 16 16  15   Temp: 98.9 F (37.2 C) 98.7 F (37.1 C)  97.8 F (36.6 C)  TempSrc: Oral Oral    SpO2: 100% 100%  100%  Weight:   65.5 kg   Height:        Intake/Output Summary (Last 24 hours) at 12/23/2021 1734 Last data filed  at 12/23/2021 1706 Gross per 24 hour  Intake 2096.06 ml  Output 2350 ml  Net -253.94 ml    Filed Weights   12/20/21 0500 12/22/21 0500 12/23/21 0500  Weight: 54.8 kg 64.4 kg 65.5 kg    Examination:  EOMI NCAT no focal deficit  CTA B no rales no rhonchi no  wheezing -ostomy in place --did not assess underlying skin No lower extremity edema Ambulatory  Data Reviewed: personally reviewed   CBC    Component Value Date/Time   WBC 9.2 12/18/2021 0440   RBC 3.57 (L) 12/18/2021 0440   HGB 10.3 (L) 12/18/2021 0440   HGB 8.8 (L) 07/19/2021 1324   HCT 34.4 (L) 12/18/2021 0440   HCT 27.3 (L) 07/19/2021 1324   PLT 245 12/18/2021 0440   PLT 360 07/19/2021 1324   MCV 96.4 12/18/2021 0440   MCV 91 07/19/2021 1324   MCH 28.9 12/18/2021 0440   MCHC 29.9 (L) 12/18/2021 0440   RDW 13.3 12/18/2021 0440   RDW 12.5 07/19/2021 1324   LYMPHSABS 2.5 12/18/2021 0440   LYMPHSABS 2.1 07/19/2021 1324   MONOABS 0.5 12/18/2021 0440   EOSABS 0.4 12/18/2021 0440   EOSABS 0.2 07/19/2021 1324   BASOSABS 0.0 12/18/2021 0440   BASOSABS 0.0 07/19/2021 1324   CMP Latest Ref Rng & Units 12/23/2021 12/22/2021 12/21/2021  Glucose 70 - 99 mg/dL 85 84 77  BUN 8 - 23 mg/dL 28(H) 32(H) 25(H)  Creatinine 0.44 - 1.00 mg/dL 1.14(H) 1.22(H) 1.12(H)  Sodium 135 - 145 mmol/L 136 136 138  Potassium 3.5 - 5.1 mmol/L 4.1 4.3 4.6  Chloride 98 - 111 mmol/L 111 107 110  CO2 22 - 32 mmol/L 22 26 25   Calcium 8.9 - 10.3 mg/dL 8.4(L) 8.6(L) 8.8(L)  Total Protein 6.5 - 8.1 g/dL - 5.6(L) 5.3(L)  Total Bilirubin 0.3 - 1.2 mg/dL - 0.3 0.2(L)  Alkaline Phos 38 - 126 U/L - 42 40  AST 15 - 41 U/L - 19 16  ALT 0 - 44 U/L - 20 17     Radiology Studies: No results found.   Scheduled Meds:  cholestyramine light  4 g Oral TID   diphenoxylate-atropine  1 tablet Oral QID   enoxaparin (LOVENOX) injection  40 mg Subcutaneous Daily   multivitamin with minerals  1 tablet Oral Daily   octreotide  50 mcg Subcutaneous Q8H   psyllium  1 packet Oral Daily   Continuous Infusions:  dextrose 5% lactated ringers 100 mL/hr at 12/23/21 0410     LOS: 10 days   Time spent: Clarita, MD [facility] To contact the attending provider between 7A-7P or the covering provider  during after hours 7P-7A, please log into the web site [email or web address] and access using universal [place] password for that web site. If you do not have the password, please call the hospital operator.  12/23/2021, 5:34 PM

## 2021-12-24 DIAGNOSIS — Z932 Ileostomy status: Secondary | ICD-10-CM | POA: Diagnosis not present

## 2021-12-24 DIAGNOSIS — R198 Other specified symptoms and signs involving the digestive system and abdomen: Secondary | ICD-10-CM | POA: Diagnosis not present

## 2021-12-24 LAB — COMPREHENSIVE METABOLIC PANEL
ALT: 17 U/L (ref 0–44)
AST: 15 U/L (ref 15–41)
Albumin: 2.8 g/dL — ABNORMAL LOW (ref 3.5–5.0)
Alkaline Phosphatase: 37 U/L — ABNORMAL LOW (ref 38–126)
Anion gap: 5 (ref 5–15)
BUN: 26 mg/dL — ABNORMAL HIGH (ref 8–23)
CO2: 20 mmol/L — ABNORMAL LOW (ref 22–32)
Calcium: 8.3 mg/dL — ABNORMAL LOW (ref 8.9–10.3)
Chloride: 112 mmol/L — ABNORMAL HIGH (ref 98–111)
Creatinine, Ser: 1.24 mg/dL — ABNORMAL HIGH (ref 0.44–1.00)
GFR, Estimated: 49 mL/min — ABNORMAL LOW (ref >60)
Glucose, Bld: 68 mg/dL — ABNORMAL LOW (ref 70–99)
Potassium: 4.6 mmol/L (ref 3.5–5.1)
Sodium: 137 mmol/L (ref 135–145)
Total Bilirubin: <0.1 mg/dL — ABNORMAL LOW (ref 0.3–1.2)
Total Protein: 5.3 g/dL — ABNORMAL LOW (ref 6.5–8.1)

## 2021-12-24 LAB — CBC WITH DIFFERENTIAL/PLATELET
Abs Immature Granulocytes: 0.03 10*3/uL (ref 0.00–0.07)
Basophils Absolute: 0 10*3/uL (ref 0.0–0.1)
Basophils Relative: 0 %
Eosinophils Absolute: 0.2 10*3/uL (ref 0.0–0.5)
Eosinophils Relative: 4 %
HCT: 26.2 % — ABNORMAL LOW (ref 36.0–46.0)
Hemoglobin: 7.9 g/dL — ABNORMAL LOW (ref 12.0–15.0)
Immature Granulocytes: 1 %
Lymphocytes Relative: 30 %
Lymphs Abs: 1.8 10*3/uL (ref 0.7–4.0)
MCH: 29.6 pg (ref 26.0–34.0)
MCHC: 30.2 g/dL (ref 30.0–36.0)
MCV: 98.1 fL (ref 80.0–100.0)
Monocytes Absolute: 0.5 10*3/uL (ref 0.1–1.0)
Monocytes Relative: 8 %
Neutro Abs: 3.4 10*3/uL (ref 1.7–7.7)
Neutrophils Relative %: 57 %
Platelets: 218 10*3/uL (ref 150–400)
RBC: 2.67 MIL/uL — ABNORMAL LOW (ref 3.87–5.11)
RDW: 13.7 % (ref 11.5–15.5)
WBC: 6.1 10*3/uL (ref 4.0–10.5)
nRBC: 0 % (ref 0.0–0.2)

## 2021-12-24 LAB — PREALBUMIN: Prealbumin: 29.2 mg/dL (ref 18–38)

## 2021-12-24 LAB — GLUCOSE, CAPILLARY
Glucose-Capillary: 108 mg/dL — ABNORMAL HIGH (ref 70–99)
Glucose-Capillary: 66 mg/dL — ABNORMAL LOW (ref 70–99)
Glucose-Capillary: 75 mg/dL (ref 70–99)
Glucose-Capillary: 79 mg/dL (ref 70–99)
Glucose-Capillary: 84 mg/dL (ref 70–99)
Glucose-Capillary: 87 mg/dL (ref 70–99)
Glucose-Capillary: 95 mg/dL (ref 70–99)

## 2021-12-24 NOTE — Progress Notes (Signed)
PROGRESS NOTE   Cynthia [name]  VHQ:[id number] DOB: Mar 01, 1961 DOA: 12/12/2021 PCP: Denita Lung, MD  Brief Narrative:   61 y.o. fanxiety, depression, bronchitis, GERD, migraine headaches, hiatal hernia, mitral valve prolapse, hypertension, IBS, digital nonintrusive session, restless leg syndrome,   Crohn's disease status post sigmoidectomy 02/2021 with Dr. Hilliard Clark at [facility] with multiple complications including prior SBO's in 01/2021, 01/30/2021 She was admitted by general surgery in the past also for AKI from high output ostomy-she was started on varied meds--unclear if compliant?  discharged in December after admission from 12/15-12/24 with high output from her--GI was consulted and patient was started on tincture of opium 6 4 times daily, Imodium 4 4 times daily, iron twice daily, FiberCon twice daily, Lomotil 2 4 times daily, cholestyramine 4 3 times daily and mesalamine was added-goal output recommended at the time was less than 1 L  Unclear whether the patient actually took any of these meds post that admission Lost to follow-up secondary to insurance issues Lost 120 pounds in 2 years Not on any Crohn's meds Last colonoscopy Cirigliano 10/20 = Crohn's ileitis tubular adenomas  partial bowel resection with colostomy   Readmit 12/13/2021 nausea + lightheaded + fatigue + headache +?  Dehydration--emptying ostomy bag hourly as opposed to 3-4 times a day Sodium 130, chloride 94, creatinine 3.6, lactic acid negative CT ABD = cholelithiasis (no cholecystitis) no other anomaly  Hospitalization complicated by hi output ostomy  + hypoglycemia making her discharge unsafe as she lives alone at home  Hospital-Problem based course  High output ileostomy dating back to index surgery 02/2021 at [facility] better occluding now--OP decreasing slowly Ostomy nurse working on getting [employer] supplies for her On Sandostatin per GI Appreicate collabortive effort sof GI/Gen  surg Continue meds as predicated by them [employer] affordable meds per Medicaid ar ein paper chart--final recommendaitons re: scope later this week or as OP as per gi/gen surg collaboration I called and spent 30 minutes on phone [phone] re: patient with Dr. Earney Mallet PA, Philis Kendall explaining the issues surrounding this lady's care She will coordinate a 2 week visit with Dr. Bubba Hales to discuss the OP work-up needed to getting this patient to ultimate ileostomy reversal IV fluids at 150 cc/h She is currently + 4 liters total OP so far today is -2.8? AKI present on admission force oral fluids--she needs to keep up with fluids to ensure volume depletion is better.  Rpt labs am Bladder scan 0 Mild hypoglycemia this morning 2/17 Secondary to poor p.o. intake sugar 74--had mild headache Graduate diet as able, continue IVF Unfounded concern for cholecystitis Ultrasound RUQ no evidence of cholecystitis only cholelithiasis--do not think this is pancreatitis-feel lipase is falsely elevated because of her altered GI anatomy/ostomy   DVT prophylaxis: Lovenox Code Status: Full Family Communication: None Disposition:  Status is: Inpatient Remains inpatient appropriate because:   Not ready for discharge    Consultants:  Gastroenterology  Procedures: n  Antimicrobials: n    Subjective:  Ostomy continues relatively high output Already ? 6000 out today discomfort in abd is slightly better   Objective: Vitals:   12/23/21 2125 12/24/21 0414 12/24/21 0500 12/24/21 1419  BP: 104/61 (!) 107/58  112/67  Pulse: 87 80  79  Resp: 18 16  14   Temp: 98.6 F (37 C) 98.3 F (36.8 C)  98.1 F (36.7 C)  TempSrc: Oral Oral    SpO2: 100% 100%  100%  Weight:   65.7 kg  Height:        Intake/Output Summary (Last 24 hours) at 12/24/2021 1550 Last data filed at 12/24/2021 1400 Gross per 24 hour  Intake 3361.22 ml  Output 3250 ml  Net 111.22 ml    Filed Weights   12/22/21 0500 12/23/21 0500  12/24/21 0500  Weight: 64.4 kg 65.5 kg 65.7 kg    Examination:  EOMI NCAT no focal deficit , fla taffect CTA B no rales no rhonchi no wheezing  New type ostomy in place --did not assess underlying skin No lower extremity edema Ambulatory  Data Reviewed: personally reviewed   CBC    Component Value Date/Time   WBC 6.1 12/24/2021 0420   RBC 2.67 (L) 12/24/2021 0420   HGB 7.9 (L) 12/24/2021 0420   HGB 8.8 (L) 07/19/2021 1324   HCT 26.2 (L) 12/24/2021 0420   HCT 27.3 (L) 07/19/2021 1324   PLT 218 12/24/2021 0420   PLT 360 07/19/2021 1324   MCV 98.1 12/24/2021 0420   MCV 91 07/19/2021 1324   MCH 29.6 12/24/2021 0420   MCHC 30.2 12/24/2021 0420   RDW 13.7 12/24/2021 0420   RDW 12.5 07/19/2021 1324   LYMPHSABS 1.8 12/24/2021 0420   LYMPHSABS 2.1 07/19/2021 1324   MONOABS 0.5 12/24/2021 0420   EOSABS 0.2 12/24/2021 0420   EOSABS 0.2 07/19/2021 1324   BASOSABS 0.0 12/24/2021 0420   BASOSABS 0.0 07/19/2021 1324   CMP Latest Ref Rng & Units 12/24/2021 12/23/2021 12/22/2021  Glucose 70 - 99 mg/dL 68(L) 85 84  BUN 8 - 23 mg/dL 26(H) 28(H) 32(H)  Creatinine 0.44 - 1.00 mg/dL 1.24(H) 1.14(H) 1.22(H)  Sodium 135 - 145 mmol/L 137 136 136  Potassium 3.5 - 5.1 mmol/L 4.6 4.1 4.3  Chloride 98 - 111 mmol/L 112(H) 111 107  CO2 22 - 32 mmol/L 20(L) 22 26  Calcium 8.9 - 10.3 mg/dL 8.3(L) 8.4(L) 8.6(L)  Total Protein 6.5 - 8.1 g/dL 5.3(L) - 5.6(L)  Total Bilirubin 0.3 - 1.2 mg/dL <0.1(L) - 0.3  Alkaline Phos 38 - 126 U/L 37(L) - 42  AST 15 - 41 U/L 15 - 19  ALT 0 - 44 U/L 17 - 20     Radiology Studies: No results found.   Scheduled Meds:  cholestyramine light  4 g Oral TID   diphenoxylate-atropine  1 tablet Oral QID   enoxaparin (LOVENOX) injection  40 mg Subcutaneous Daily   loperamide  2 mg Oral QID   melatonin  3 mg Oral QHS   multivitamin with minerals  1 tablet Oral Daily   octreotide  50 mcg Subcutaneous Q8H   psyllium  1 packet Oral Daily   Continuous Infusions:   dextrose 5% lactated ringers 150 mL/hr at 12/24/21 1150     LOS: 11 days   Time spent: [place], MD [facility] To contact the attending provider between 7A-7P or the covering provider during after hours 7P-7A, please log into the web site [email or web address] and access using universal [place] password for that web site. If you do not have the password, please call the hospital operator.  12/24/2021, 3:50 PM

## 2021-12-24 NOTE — Progress Notes (Signed)
[facility] Gastroenterology Progress Note  Cynthia [name] [id number] 61 y.o. [birth year]  CC:  Crohn's disease, s/p ileostomy, high output ileostomy   Subjective: Patient states she is still having high output. Has emptied her bag about 3 times this morning. 2400 mL output in last 24 hrs. Denies abdominal pain. Denies nausea/vomiting.  ROS : Review of Systems  Gastrointestinal:  Positive for diarrhea. Negative for abdominal pain, blood in stool, constipation, heartburn, melena, nausea and vomiting.  Genitourinary:  Negative for dysuria and urgency.     Objective: Vital signs in last 24 hours: Vitals:   12/23/21 2125 12/24/21 0414  BP: 104/61 (!) 107/58  Pulse: 87 80  Resp: 18 16  Temp: 98.6 F (37 C) 98.3 F (36.8 C)  SpO2: 100% 100%    Physical Exam:  General:  Alert, cooperative, no distress, appears stated age  Head:  Normocephalic, without obvious abnormality, atraumatic  Eyes:  Anicteric sclera, EOM's intact  Lungs:   Clear to auscultation bilaterally, respirations unlabored  Heart:  Regular rate and rhythm, S1, S2 normal  Abdomen:   Soft, non-tender, bowel sounds active all four quadrants,  no masses, ileostomy noted. Recently emptied.    Lab Results: Recent Labs    12/23/21 0745 12/24/21 0420  NA 136 137  K 4.1 4.6  CL 111 112*  CO2 22 20*  GLUCOSE 85 68*  BUN 28* 26*  CREATININE 1.14* 1.24*  CALCIUM 8.4* 8.3*   Recent Labs    12/22/21 0414 12/24/21 0420  AST 19 15  ALT 20 17  ALKPHOS 42 37*  BILITOT 0.3 <0.1*  PROT 5.6* 5.3*  ALBUMIN 2.8* 2.8*   Recent Labs    12/24/21 0420  WBC 6.1  NEUTROABS 3.4  HGB 7.9*  HCT 26.2*  MCV 98.1  PLT 218   No results for input(s): LABPROT, INR in the last 72 hours.    Assessment High output ileostomy -Ileostomy s/p 02/2021 -2500 mL in the last 24 hours -BUN 26, creatinine 1.24 -No leukocytosis   Plan: Plan for discharge with outpatient surgical follow up for consideration of ostomy take down. Continue  Sandostatin 50 mcg every 8 hours [facility] will follow  Garnette Scheuermann PA-C 12/24/2021, 10:58 AM  Contact #  [phone]

## 2021-12-24 NOTE — Progress Notes (Signed)
Hypoglycemic Event  CBG: 66  Treatment: 4 oz juice/soda  Symptoms: None  Follow-up CBG: Time: 0440 CBG Result:79  Possible Reasons for Event: Increased stool motility  Comments/MD notified: No new orders at this time    Fatmata Legere  Tamala Julian

## 2021-12-24 NOTE — Progress Notes (Signed)
[facility] Progress Note     Subjective: CC:  Reports mild abd pain. Denies nausea, vomiting, or belching. Tolerating PO but has poor appetite and PO intake, along with high output ileostomy. When I ask about her management of her ileostomy at home she states she can afford the OTC meds (metamucil, imodium) but has not been able to get prescription meds. She tells me that her surgeon doesn't accept her current insurance.   Objective: Vital signs in last 24 hours: Temp:  [97.8 F (36.6 C)-98.6 F (37 C)] 98.3 F (36.8 C) (02/21 0414) Pulse Rate:  [80-87] 80 (02/21 0414) Resp:  [15-18] 16 (02/21 0414) BP: (104-107)/(58-63) 107/58 (02/21 0414) SpO2:  [100 %] 100 % (02/21 0414) Weight:  [65.7 kg] 65.7 kg (02/21 0500) Last BM Date : 12/24/21  Intake/Output from previous day: 02/20 0701 - 02/21 0700 In: 2018 [P.O.:840; I.V.:1178] Out: 2400 [Stool:2400] Intake/Output this shift: Total I/O In: 240 [P.O.:240] Out: -   PE: Gen:  Alert, NAD, pleasant Card:  Regular rate and rhythm, pedal pulses 2+ BL Pulm:  Normal effort, clear to auscultation bilaterally Abd: Soft, non-tender, non-distended, +BS, LUQ mucous fistula with bandage over it- scant mucous on bandage, periwound in tact. Ileostomy viable putting out semisolid, nonbloody stool - connected to foley bag due to high output. Skin: warm and dry, no rashes  Psych: A&Ox3   Lab Results:  Recent Labs    12/24/21 0420  WBC 6.1  HGB 7.9*  HCT 26.2*  PLT 218   BMET Recent Labs    12/23/21 0745 12/24/21 0420  NA 136 137  K 4.1 4.6  CL 111 112*  CO2 22 20*  GLUCOSE 85 68*  BUN 28* 26*  CREATININE 1.14* 1.24*  CALCIUM 8.4* 8.3*   PT/INR No results for input(s): LABPROT, INR in the last 72 hours. CMP     Component Value Date/Time   NA 137 12/24/2021 0420   NA 136 07/19/2021 1324   K 4.6 12/24/2021 0420   CL 112 (H) 12/24/2021 0420   CO2 20 (L) 12/24/2021 0420   GLUCOSE 68 (L) 12/24/2021 0420   BUN  26 (H) 12/24/2021 0420   BUN 25 07/19/2021 1324   CREATININE 1.24 (H) 12/24/2021 0420   CREATININE 0.94 05/08/2016 0741   CALCIUM 8.3 (L) 12/24/2021 0420   PROT 5.3 (L) 12/24/2021 0420   PROT 5.5 (L) 07/19/2021 1324   ALBUMIN 2.8 (L) 12/24/2021 0420   ALBUMIN 3.4 (L) 07/19/2021 1324   AST 15 12/24/2021 0420   ALT 17 12/24/2021 0420   ALKPHOS 37 (L) 12/24/2021 0420   BILITOT <0.1 (L) 12/24/2021 0420   BILITOT <0.2 07/19/2021 1324   GFRNONAA 49 (L) 12/24/2021 0420   GFRAA 77 12/18/2020 1548   Lipase     Component Value Date/Time   LIPASE 44 12/18/2021 0440   Anti-infectives: Anti-infectives (From admission, onward)    None      Assessment/Plan High output ileostomy  Cynthia [name] is an 61 y.o. 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  Underwent ex-lap/ileocecectomy and sigmoid colectomy; descending colon mucous fistula; 02/2021 at [name] Dr. Hilliard Clark - afebrile, VSS  - scheduled Imodium 2 mg 4 times daily, Lomotil 4 times daily, daily fiber; started on Sandostatin per GI.  - Maintenance IV fluids/supportive fluids.   - Last albumin 2.8; prealbumin 29.2 - reassuring. - GI for evaluation of both rectum/remnant sigmoid as well as the rest of colon through mucus  fistula. Would also appreciate GI recs on medical management of crohn's moving forward.  - greatly appreciate WOC and CM insight and assistance, I spoke to [place] CSW this AM in regards to patients current insurance (medicaid [place] access) coverage and will continue close communications with her.  - no urgent, emergent surgical needs. Need to control her high output ileostomy with a regimen that is sustainable at home. Would need endoscopic evaluation of her colon prior to further surgical planning. Ideally she could follow up with Dr. Hilliard Clark at [facility].     LOS: 11 days   I reviewed nursing notes, hospitalist notes, last 24 h vitals and pain scores, last 48 h intake and output, last  24 h labs and trends, and last 24 h imaging results.  This care required moderate level of medical decision making.   Obie Dredge, PA-C [facility] Please see Amion for pager number during day hours 7:00am-4:30pm

## 2021-12-25 ENCOUNTER — Inpatient Hospital Stay (HOSPITAL_COMMUNITY): Payer: Medicaid Other

## 2021-12-25 DIAGNOSIS — Z932 Ileostomy status: Secondary | ICD-10-CM | POA: Diagnosis not present

## 2021-12-25 DIAGNOSIS — R609 Edema, unspecified: Secondary | ICD-10-CM

## 2021-12-25 DIAGNOSIS — R198 Other specified symptoms and signs involving the digestive system and abdomen: Secondary | ICD-10-CM | POA: Diagnosis not present

## 2021-12-25 DIAGNOSIS — E162 Hypoglycemia, unspecified: Secondary | ICD-10-CM

## 2021-12-25 DIAGNOSIS — E8770 Fluid overload, unspecified: Secondary | ICD-10-CM

## 2021-12-25 LAB — CBC WITH DIFFERENTIAL/PLATELET
Abs Immature Granulocytes: 0.02 10*3/uL (ref 0.00–0.07)
Basophils Absolute: 0 10*3/uL (ref 0.0–0.1)
Basophils Relative: 0 %
Eosinophils Absolute: 0.3 10*3/uL (ref 0.0–0.5)
Eosinophils Relative: 5 %
HCT: 26.4 % — ABNORMAL LOW (ref 36.0–46.0)
Hemoglobin: 7.8 g/dL — ABNORMAL LOW (ref 12.0–15.0)
Immature Granulocytes: 0 %
Lymphocytes Relative: 35 %
Lymphs Abs: 2 10*3/uL (ref 0.7–4.0)
MCH: 29 pg (ref 26.0–34.0)
MCHC: 29.5 g/dL — ABNORMAL LOW (ref 30.0–36.0)
MCV: 98.1 fL (ref 80.0–100.0)
Monocytes Absolute: 0.4 10*3/uL (ref 0.1–1.0)
Monocytes Relative: 8 %
Neutro Abs: 3 10*3/uL (ref 1.7–7.7)
Neutrophils Relative %: 52 %
Platelets: 222 10*3/uL (ref 150–400)
RBC: 2.69 MIL/uL — ABNORMAL LOW (ref 3.87–5.11)
RDW: 13.9 % (ref 11.5–15.5)
WBC: 5.8 10*3/uL (ref 4.0–10.5)
nRBC: 0 % (ref 0.0–0.2)

## 2021-12-25 LAB — MAGNESIUM: Magnesium: 1.4 mg/dL — ABNORMAL LOW (ref 1.7–2.4)

## 2021-12-25 LAB — GLUCOSE, CAPILLARY
Glucose-Capillary: 130 mg/dL — ABNORMAL HIGH (ref 70–99)
Glucose-Capillary: 60 mg/dL — ABNORMAL LOW (ref 70–99)
Glucose-Capillary: 67 mg/dL — ABNORMAL LOW (ref 70–99)
Glucose-Capillary: 75 mg/dL (ref 70–99)
Glucose-Capillary: 80 mg/dL (ref 70–99)
Glucose-Capillary: 82 mg/dL (ref 70–99)
Glucose-Capillary: 83 mg/dL (ref 70–99)
Glucose-Capillary: 97 mg/dL (ref 70–99)

## 2021-12-25 LAB — BRAIN NATRIURETIC PEPTIDE: B Natriuretic Peptide: 314.1 pg/mL — ABNORMAL HIGH (ref 0.0–100.0)

## 2021-12-25 LAB — COMPREHENSIVE METABOLIC PANEL
ALT: 15 U/L (ref 0–44)
AST: 14 U/L — ABNORMAL LOW (ref 15–41)
Albumin: 2.8 g/dL — ABNORMAL LOW (ref 3.5–5.0)
Alkaline Phosphatase: 41 U/L (ref 38–126)
Anion gap: 3 — ABNORMAL LOW (ref 5–15)
BUN: 33 mg/dL — ABNORMAL HIGH (ref 8–23)
CO2: 22 mmol/L (ref 22–32)
Calcium: 8.5 mg/dL — ABNORMAL LOW (ref 8.9–10.3)
Chloride: 113 mmol/L — ABNORMAL HIGH (ref 98–111)
Creatinine, Ser: 1.25 mg/dL — ABNORMAL HIGH (ref 0.44–1.00)
GFR, Estimated: 49 mL/min — ABNORMAL LOW (ref >60)
Glucose, Bld: 85 mg/dL (ref 70–99)
Potassium: 4.8 mmol/L (ref 3.5–5.1)
Sodium: 138 mmol/L (ref 135–145)
Total Bilirubin: 0.2 mg/dL — ABNORMAL LOW (ref 0.3–1.2)
Total Protein: 5.6 g/dL — ABNORMAL LOW (ref 6.5–8.1)

## 2021-12-25 MED ORDER — MAGNESIUM SULFATE 4 GM/100ML IV SOLN
4.0000 g | Freq: Once | INTRAVENOUS | Status: AC
  Administered 2021-12-25: 4 g via INTRAVENOUS
  Filled 2021-12-25: qty 100

## 2021-12-25 MED ORDER — OCTREOTIDE ACETATE 50 MCG/ML IJ SOLN
100.0000 ug | Freq: Three times a day (TID) | INTRAMUSCULAR | Status: DC
  Administered 2021-12-25 – 2021-12-27 (×6): 100 ug via SUBCUTANEOUS
  Filled 2021-12-25 (×7): qty 2

## 2021-12-25 MED ORDER — LOPERAMIDE HCL 2 MG PO CAPS: 2.0000 mg | ORAL_CAPSULE | Freq: Four times a day (QID) | ORAL | Status: DC | PRN

## 2021-12-25 MED ORDER — FERROUS SULFATE 325 (65 FE) MG PO TABS
325.0000 mg | ORAL_TABLET | Freq: Two times a day (BID) | ORAL | Status: DC
  Administered 2021-12-25 – 2021-12-27 (×4): 325 mg via ORAL
  Filled 2021-12-25 (×4): qty 1

## 2021-12-25 MED ORDER — LOPERAMIDE HCL 2 MG PO CAPS
4.0000 mg | ORAL_CAPSULE | Freq: Two times a day (BID) | ORAL | Status: DC
  Administered 2021-12-25 – 2021-12-27 (×4): 4 mg via ORAL
  Filled 2021-12-25 (×4): qty 2

## 2021-12-25 MED ORDER — CALCIUM POLYCARBOPHIL 625 MG PO TABS
625.0000 mg | ORAL_TABLET | Freq: Two times a day (BID) | ORAL | Status: DC
  Administered 2021-12-25 – 2021-12-27 (×4): 625 mg via ORAL
  Filled 2021-12-25 (×4): qty 1

## 2021-12-25 NOTE — Assessment & Plan Note (Addendum)
Bilateral LE edema, stop IVF Continue lasix at discharge Venous US negative for DVT Echo 01/2021 with grade 1 diastolic dysfunction, EF 34-96% UA without protein on 12/2021

## 2021-12-25 NOTE — Assessment & Plan Note (Addendum)
downtrending H/H trend b12 deficiency, supplement

## 2021-12-25 NOTE — Progress Notes (Signed)
[facility]enterology Progress Note  Cynthia [name] [id number] 61 y.o. Sep 01, 1961  CC:  Crohn's disease s/p ileostomy, high output ileostomy   Subjective: Patient resting in bed this morning.  States she is tired and did not sleep much last night but is feeling better today.  She reports ostomy output has decreased.  Denies nausea, vomiting she is feeling slightly nauseated this morning but denies vomiting, abdominal pain, fever, chills.  Continuing to tolerate her diet well.  ROS : Review of Systems  Constitutional:  Negative for chills and fever.  Gastrointestinal:  Negative for abdominal pain, blood in stool, heartburn, melena, nausea and vomiting.     Objective: Vital signs in last 24 hours: Vitals:   12/24/21 2143 12/25/21 0602  BP: (!) 97/59 94/61  Pulse: 81 79  Resp:  15  Temp: 98.9 F (37.2 C) 98.5 F (36.9 C)  SpO2: 99% 100%    Physical Exam:  General:  Alert, cooperative, no distress  Head:  Normocephalic, without obvious abnormality, atraumatic  Eyes:  Anicteric sclera, EOM's intact  Lungs:   Clear to auscultation bilaterally, respirations unlabored  Heart:  Regular rate and rhythm, S1, S2 normal  Abdomen:   Soft, non-tender, bowel sounds active all four quadrants,  no masses, currently no output in ostomy bag.          Lab Results: Recent Labs    12/24/21 0420 12/25/21 0444  NA 137 138  K 4.6 4.8  CL 112* 113*  CO2 20* 22  GLUCOSE 68* 85  BUN 26* 33*  CREATININE 1.24* 1.25*  CALCIUM 8.3* 8.5*  MG  --  1.4*   Recent Labs    12/24/21 0420 12/25/21 0444  AST 15 14*  ALT 17 15  ALKPHOS 37* 41  BILITOT <0.1* 0.2*  PROT 5.3* 5.6*  ALBUMIN 2.8* 2.8*   Recent Labs    12/24/21 0420 12/25/21 0444  WBC 6.1 5.8  NEUTROABS 3.4 3.0  HGB 7.9* 7.8*  HCT 26.2* 26.4*  MCV 98.1 98.1  PLT 218 222   No results for input(s): LABPROT, INR in the last 72 hours.    Assessment High output ileostomy -Ileostomy s/p 02/2021 -1950 mL in the last 24  hours -BUN 26, creatinine 1.24 -No leukocytosis   Plan: Plan for discharge with outpatient surgical follow up for consideration of ostomy take down. Continue Sandostatin 50 mcg every 8 hours. [facility] will follow.  Angelique Holm PA-C 12/25/2021, 11:06 AM  Contact #  [phone]

## 2021-12-25 NOTE — Assessment & Plan Note (Addendum)
Mild to ~60's.  She's asymptomatic.

## 2021-12-25 NOTE — Progress Notes (Signed)
BLE venous duplex has been completed.   Results can be found under chart review under CV PROC. 12/25/2021 2:17 PM Cameka Rae RVT, RDMS

## 2021-12-25 NOTE — Progress Notes (Signed)
[facility] Progress Note     Subjective: CC:  No complaints this morning.   Objective: Vital signs in last 24 hours: Temp:  [98.1 F (36.7 C)-98.9 F (37.2 C)] 98.5 F (36.9 C) (02/22 0602) Pulse Rate:  [79-81] 79 (02/22 0602) Resp:  [14-15] 15 (02/22 0602) BP: (94-112)/(59-67) 94/61 (02/22 0602) SpO2:  [99 %-100 %] 100 % (02/22 0602) Weight:  [65.8 kg] 65.8 kg (02/22 0602) Last BM Date : 12/24/21  Intake/Output from previous day: 02/21 0701 - 02/22 0700 In: 3714.2 [P.O.:2520; I.V.:1194.2] Out: 2750 [Stool:2750] Intake/Output this shift: No intake/output data recorded.  PE: Gen:  Alert, NAD, pleasant Card:  Regular rate and rhythm, pedal pulses 2+ BL Pulm:  Normal effort, clear to auscultation bilaterally Abd: Soft, non-tender, non-distended, +BS, LUQ mucous fistula with bandage over it- scant mucous on bandage, periwound in tact. Ileostomy viable putting out thin liquid stool- connected to foley bag due to high output. Skin: warm and dry, no rashes  Psych: A&Ox3   Lab Results:  Recent Labs    12/24/21 0420 12/25/21 0444  WBC 6.1 5.8  HGB 7.9* 7.8*  HCT 26.2* 26.4*  PLT 218 222    BMET Recent Labs    12/24/21 0420 12/25/21 0444  NA 137 138  K 4.6 4.8  CL 112* 113*  CO2 20* 22  GLUCOSE 68* 85  BUN 26* 33*  CREATININE 1.24* 1.25*  CALCIUM 8.3* 8.5*    PT/INR No results for input(s): LABPROT, INR in the last 72 hours. CMP     Component Value Date/Time   NA 138 12/25/2021 0444   NA 136 07/19/2021 1324   K 4.8 12/25/2021 0444   CL 113 (H) 12/25/2021 0444   CO2 22 12/25/2021 0444   GLUCOSE 85 12/25/2021 0444   BUN 33 (H) 12/25/2021 0444   BUN 25 07/19/2021 1324   CREATININE 1.25 (H) 12/25/2021 0444   CREATININE 0.94 05/08/2016 0741   CALCIUM 8.5 (L) 12/25/2021 0444   PROT 5.6 (L) 12/25/2021 0444   PROT 5.5 (L) 07/19/2021 1324   ALBUMIN 2.8 (L) 12/25/2021 0444   ALBUMIN 3.4 (L) 07/19/2021 1324   AST 14 (L) 12/25/2021 0444   ALT  15 12/25/2021 0444   ALKPHOS 41 12/25/2021 0444   BILITOT 0.2 (L) 12/25/2021 0444   BILITOT <0.2 07/19/2021 1324   GFRNONAA 49 (L) 12/25/2021 0444   GFRAA 77 12/18/2020 1548   Lipase     Component Value Date/Time   LIPASE 44 12/18/2021 0440   Anti-infectives: Anti-infectives (From admission, onward)    None      Assessment/Plan High output ileostomy  Cynthia [name] is an 61 y.o. 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  Underwent ex-lap/ileocecectomy and sigmoid colectomy; descending colon mucous fistula; 02/2021 at [place] Dr. Hilliard Clark - afebrile, VSS  - scheduled Imodium 2 mg 4 times daily, Lomotil 4 times daily, daily fiber; started on Sandostatin per GI.  - Maintenance IV fluids/supportive fluids.   - Last albumin 2.8; prealbumin 29.2 - reassuring. - GI for evaluation of both rectum/remnant sigmoid as well as the rest of colon through mucus fistula. Would also appreciate GI recs on medical management of crohn's moving forward.  - greatly appreciate WOC and CM insight and assistance, our teeam spoke to [name] yesterday AM in regards to patients current insurance (medicaid [place] access) coverage and will continue close communications with her.  - no urgent, emergent surgical needs. Need to control  her high output ileostomy with a regimen that is sustainable at home. Would need endoscopic evaluation of her colon prior to further surgical planning. Ideally she could follow up with Dr. Hilliard Clark at [place].   We will follow peripherally.    LOS: 12 days   I reviewed nursing notes, hospitalist notes, last 24 h vitals and pain scores, last 48 h intake and output, last 24 h labs and trends, and last 24 h imaging results.  This care required moderate level of medical decision making.   [facility] Please see Amion for floor coverage during day hours 7:00am-4:30pm

## 2021-12-25 NOTE — Progress Notes (Signed)
PROGRESS NOTE    Cynthia [name]  EZM:[id number] DOB: 05-19-1961 DOA: 12/12/2021 PCP: Denita Lung, MD  Chief Complaint  Patient presents with   Abdominal Pain   Stoma Prolapse     Brief Narrative:  Cynthia [name] is Cynthia [name] 61 y.o. female with medical history significant of anxiety, depression, bronchitis, GERD, migraine headaches, hiatal hernia, mitral valve prolapse, hypertension, IBS, digital nonintrusive session, restless leg syndrome, partial bowel resection with colostomy who is coming to the emergency department with abdominal pain in stoma site, occasional nausea, lightheadedness, fatigue, frontal headache, dehydration in the setting of recent increase in colostomy output.  Of note, she was discharged in December after admission from 12/15-12/24 with high output from her ostomy, but it doesn't sound like she filled many of her prescriptions at discharge?    Continued issues with high output ostomy and hypoglycemia.   Surgery and GI following.  See below for additional details      Assessment & Plan:   Principal Problem:   High output ileostomy (HCC) Active Problems:   AKI (acute kidney injury) ([place])   Volume overload   Benign essential HTN   Hypotension   Abdominal pain   Gastroesophageal reflux disease   Hypoglycemia   Leukocytosis   Hyponatremia   Malnutrition of moderate degree   Assessment and Plan: * High output ileostomy (HCC) 2.7 L out yesterday Cholestyramine, metamucil, lomotil, imodium.  Now on octreotide. Discussed pentasa with GI earlier in admission, will hold off given nonadherence  last admission was on tincture of opium, imodium, iron, fibercon, lomotil, choleystramine and mesalamine (per her med rec, she doesn't appear to be taking these at this time -> discussed with her and pharmacy, sounds like she only was taking lomotil and imodium?) CT abdomen pelvis without acute intraabdominal pathology Unclear cause for increased output  Follow fecal  lactoferrin - negative GI consult, appreciate assistance Surgery c/s, appreciate recs Ostomy nurse working on prism ostomy supplies Will need to make sure she'll be able to get all meds prior to discharge Strict I/O, daily weights   Volume overload Bilateral LE edema, stop IVF Venous US pending Echo 01/2021 with grade 1 diastolic dysfunction, EF 50-35% Hold IVF for now, additional w/u as indicated  AKI (acute kidney injury) ([place])- (present on admission) Resolved Baseline creatinine appears to be ~1.1 Strict I/O, daily weights Stop IVF as she appears to be overloaded  Hypotension- (present on admission) Improved, follow  Benign essential HTN- (present on admission) Hold antihypertensives (lasix)  Abdominal pain- (present on admission) Follow RUQ [place] no evidence cholecystitis Lipase peaked to 194, resolved - ? Mild pancreatitis, this wasn't reflected on imaging - unclear significance  resolved    Hypoglycemia Recurrent, mild this AM, 12/25/21 Not on any insulin or any hypoglycemic meds  Gastroesophageal reflux disease- (present on admission) PPI   DVT prophylaxis: lovenox Code Status: full Family Communication: none Disposition:   Status is: Inpatient Remains inpatient appropriate because: high output ostomy   Consultants:  GI surgery  Procedures:  LE [place] Summary:  BILATERAL:  - No evidence of deep vein thrombosis seen in the lower extremities,  bilaterally.  - No evidence of superficial venous thrombosis in the lower extremities,  bilaterally.  -No evidence of popliteal cyst, bilaterally.  - Subcutaneous edema seen throughout bilateral lower extremities.  Antimicrobials:  Anti-infectives (From admission, onward)    None      Subjective: No new complaints  Objective: Vitals:   12/24/21 1419 12/24/21 2143 12/25/21 0602 12/25/21  1314  BP: 112/67 (!) 97/59 94/61 (!) 102/58  Pulse: 79 81 79 78  Resp: 14  15 14   Temp: 98.1 F (36.7 C) 98.9 F  (37.2 C) 98.5 F (36.9 C) 98.1 F (36.7 C)  TempSrc:  Oral  Oral  SpO2: 100% 99% 100% 100%  Weight:   65.8 kg   Height:        Intake/Output Summary (Last 24 hours) at 12/25/2021 1440 Last data filed at 12/25/2021 1400 Gross per 24 hour  Intake 3322.35 ml  Output 1750 ml  Net 1572.35 ml   Filed Weights   12/23/21 0500 12/24/21 0500 12/25/21 0602  Weight: 65.5 kg 65.7 kg 65.8 kg    Examination:  General exam: Appears calm and comfortable  Respiratory system: unlabored Cardiovascular system: RRR Gastrointestinal system: ileostomy present, no abdominal TTP Central nervous system: Alert and oriented. No focal neurological deficits. Extremities: bilateral LE edema  Data Reviewed: I have personally reviewed following labs and imaging studies  CBC: Recent Labs  Lab 12/24/21 0420 12/25/21 0444  WBC 6.1 5.8  NEUTROABS 3.4 3.0  HGB 7.9* 7.8*  HCT 26.2* 26.4*  MCV 98.1 98.1  PLT 218 568    Basic Metabolic Panel: Recent Labs  Lab 12/21/21 0529 12/22/21 0414 12/23/21 0745 12/24/21 0420 12/25/21 0444  NA 138 136 136 137 138  K 4.6 4.3 4.1 4.6 4.8  CL 110 107 111 112* 113*  CO2 25 26 22  20* 22  GLUCOSE 77 84 85 68* 85  BUN 25* 32* 28* 26* 33*  CREATININE 1.12* 1.22* 1.14* 1.24* 1.25*  CALCIUM 8.8* 8.6* 8.4* 8.3* 8.5*  MG  --   --   --   --  1.4*    GFR: Estimated Creatinine Clearance: 43.5 mL/min (Cynthia [name]) (by C-G formula based on SCr of 1.25 mg/dL (H)).  Liver Function Tests: Recent Labs  Lab 12/20/21 0426 12/21/21 0529 12/22/21 0414 12/24/21 0420 12/25/21 0444  AST 14* 16 19 15  14*  ALT 13 17 20 17 15   ALKPHOS 40 40 42 37* 41  BILITOT 0.1* 0.2* 0.3 <0.1* 0.2*  PROT 5.3* 5.3* 5.6* 5.3* 5.6*  ALBUMIN 2.6* 2.6* 2.8* 2.8* 2.8*    CBG: Recent Labs  Lab 12/25/21 0009 12/25/21 0408 12/25/21 0726 12/25/21 0751 12/25/21 1149  GLUCAP 97 75 67* 80 130*     No results found for this or any previous visit (from the past 240 hour(s)).       Radiology  Studies: VAS [place] LOWER EXTREMITY VENOUS (DVT)  Result Date: 12/25/2021  Lower Venous DVT Study Patient Name:  Cynthia [name] [facility]  Date of Exam:   12/25/2021 Medical Rec #: [id number]          Accession #:    [id number] Date of Birth: 1961-03-23         Patient Gender: F Patient Age:   14 years Exam Location:  [facility] Procedure:      VAS [place] LOWER EXTREMITY VENOUS (DVT) Referring Phys: Secily Walthour POWELL JR --------------------------------------------------------------------------------  Indications: Edema.  Comparison Study: Previous exam on 10/22/2021 was negative for DVT Performing Technologist: Rogelia Rohrer RVT, RDMS  Examination Guidelines: Horace Lukas complete evaluation includes B-mode imaging, spectral Doppler, color Doppler, and power Doppler as needed of all accessible portions of each vessel. Bilateral testing is considered an integral part of [place] complete examination. Limited examinations for reoccurring indications may be performed as noted. The reflux portion of the exam is performed with the patient in reverse Trendelenburg.  +---------+---------------+---------+-----------+----------+--------------+  RIGHT     Compressibility Phasicity Spontaneity Properties Thrombus Aging  +---------+---------------+---------+-----------+----------+--------------+  CFV       Full            Yes       Yes                                    +---------+---------------+---------+-----------+----------+--------------+  SFJ       Full                                                             +---------+---------------+---------+-----------+----------+--------------+  FV Prox   Full            Yes       Yes                                    +---------+---------------+---------+-----------+----------+--------------+  FV Mid    Full            Yes       Yes                                    +---------+---------------+---------+-----------+----------+--------------+  FV Distal Full            Yes       Yes                                     +---------+---------------+---------+-----------+----------+--------------+  PFV       Full                                                             +---------+---------------+---------+-----------+----------+--------------+  POP       Full            Yes       Yes                                    +---------+---------------+---------+-----------+----------+--------------+  PTV       Full                                                             +---------+---------------+---------+-----------+----------+--------------+  PERO      Full                                                             +---------+---------------+---------+-----------+----------+--------------+   +---------+---------------+---------+-----------+----------+-------------------+  LEFT      Compressibility Phasicity Spontaneity Properties Thrombus Aging       +---------+---------------+---------+-----------+----------+-------------------+  CFV       Full            Yes       Yes                                         +---------+---------------+---------+-----------+----------+-------------------+  SFJ       Full                                                                  +---------+---------------+---------+-----------+----------+-------------------+  FV Prox   Full            Yes       Yes                                         +---------+---------------+---------+-----------+----------+-------------------+  FV Mid    Full            Yes       Yes                                         +---------+---------------+---------+-----------+----------+-------------------+  FV Distal Full            Yes       Yes                                         +---------+---------------+---------+-----------+----------+-------------------+  PFV       Full                                                                  +---------+---------------+---------+-----------+----------+-------------------+  POP       Full            Yes       Yes                                          +---------+---------------+---------+-----------+----------+-------------------+  PTV       Full                                                                  +---------+---------------+---------+-----------+----------+-------------------+  PERO      Full  Not well visualized  +---------+---------------+---------+-----------+----------+-------------------+     Summary: BILATERAL: - No evidence of deep vein thrombosis seen in the lower extremities, bilaterally. - No evidence of superficial venous thrombosis in the lower extremities, bilaterally. -No evidence of popliteal cyst, bilaterally. - Subcutaneous edema seen throughout bilateral lower extremities.  *See table(s) above for measurements and observations.    Preliminary         Scheduled Meds:  cholestyramine light  4 g Oral TID   diphenoxylate-atropine  1 tablet Oral QID   enoxaparin (LOVENOX) injection  40 mg Subcutaneous Daily   loperamide  2 mg Oral QID   melatonin  3 mg Oral QHS   multivitamin with minerals  1 tablet Oral Daily   octreotide  50 mcg Subcutaneous Q8H   psyllium  1 packet Oral Daily   Continuous Infusions:   LOS: 12 days    Time spent: over 30 min    Fayrene Helper, MD [facility]   To contact the attending provider between 7A-7P or the covering provider during after hours 7P-7A, please log into the web site [email or web address] and access using universal [place] password for that web site. If you do not have the password, please call the hospital operator.  12/25/2021, 2:40 PM

## 2021-12-26 ENCOUNTER — Other Ambulatory Visit (HOSPITAL_COMMUNITY): Payer: Self-pay

## 2021-12-26 DIAGNOSIS — Z932 Ileostomy status: Secondary | ICD-10-CM | POA: Diagnosis not present

## 2021-12-26 DIAGNOSIS — R198 Other specified symptoms and signs involving the digestive system and abdomen: Secondary | ICD-10-CM | POA: Diagnosis not present

## 2021-12-26 LAB — CBC WITH DIFFERENTIAL/PLATELET
Abs Immature Granulocytes: 0.01 10*3/uL (ref 0.00–0.07)
Basophils Absolute: 0 10*3/uL (ref 0.0–0.1)
Basophils Relative: 0 %
Eosinophils Absolute: 0.3 10*3/uL (ref 0.0–0.5)
Eosinophils Relative: 6 %
HCT: 25.1 % — ABNORMAL LOW (ref 36.0–46.0)
Hemoglobin: 7.6 g/dL — ABNORMAL LOW (ref 12.0–15.0)
Immature Granulocytes: 0 %
Lymphocytes Relative: 32 %
Lymphs Abs: 1.8 10*3/uL (ref 0.7–4.0)
MCH: 29.6 pg (ref 26.0–34.0)
MCHC: 30.3 g/dL (ref 30.0–36.0)
MCV: 97.7 fL (ref 80.0–100.0)
Monocytes Absolute: 0.4 10*3/uL (ref 0.1–1.0)
Monocytes Relative: 7 %
Neutro Abs: 3 10*3/uL (ref 1.7–7.7)
Neutrophils Relative %: 55 %
Platelets: 213 10*3/uL (ref 150–400)
RBC: 2.57 MIL/uL — ABNORMAL LOW (ref 3.87–5.11)
RDW: 13.8 % (ref 11.5–15.5)
WBC: 5.6 10*3/uL (ref 4.0–10.5)
nRBC: 0 % (ref 0.0–0.2)

## 2021-12-26 LAB — IRON AND TIBC
Iron: 30 ug/dL (ref 28–170)
Saturation Ratios: 8 % — ABNORMAL LOW (ref 10.4–31.8)
TIBC: 389 ug/dL (ref 250–450)
UIBC: 359 ug/dL

## 2021-12-26 LAB — COMPREHENSIVE METABOLIC PANEL
ALT: 15 U/L (ref 0–44)
AST: 16 U/L (ref 15–41)
Albumin: 2.6 g/dL — ABNORMAL LOW (ref 3.5–5.0)
Alkaline Phosphatase: 40 U/L (ref 38–126)
Anion gap: 3 — ABNORMAL LOW (ref 5–15)
BUN: 29 mg/dL — ABNORMAL HIGH (ref 8–23)
CO2: 21 mmol/L — ABNORMAL LOW (ref 22–32)
Calcium: 8.4 mg/dL — ABNORMAL LOW (ref 8.9–10.3)
Chloride: 112 mmol/L — ABNORMAL HIGH (ref 98–111)
Creatinine, Ser: 1.46 mg/dL — ABNORMAL HIGH (ref 0.44–1.00)
GFR, Estimated: 40 mL/min — ABNORMAL LOW (ref >60)
Glucose, Bld: 72 mg/dL (ref 70–99)
Potassium: 4.9 mmol/L (ref 3.5–5.1)
Sodium: 136 mmol/L (ref 135–145)
Total Bilirubin: 0.1 mg/dL — ABNORMAL LOW (ref 0.3–1.2)
Total Protein: 5.3 g/dL — ABNORMAL LOW (ref 6.5–8.1)

## 2021-12-26 LAB — FERRITIN: Ferritin: 34 ng/mL (ref 11–307)

## 2021-12-26 LAB — FOLATE: Folate: 10.9 ng/mL (ref >5.9)

## 2021-12-26 LAB — GLUCOSE, CAPILLARY
Glucose-Capillary: 61 mg/dL — ABNORMAL LOW (ref 70–99)
Glucose-Capillary: 96 mg/dL (ref 70–99)
Glucose-Capillary: 78 mg/dL (ref 70–99)
Glucose-Capillary: 116 mg/dL — ABNORMAL HIGH (ref 70–99)
Glucose-Capillary: 81 mg/dL (ref 70–99)

## 2021-12-26 LAB — MAGNESIUM: Magnesium: 2.1 mg/dL (ref 1.7–2.4)

## 2021-12-26 LAB — PHOSPHORUS: Phosphorus: 3.4 mg/dL (ref 2.5–4.6)

## 2021-12-26 LAB — VITAMIN B12: Vitamin B-12: 157 pg/mL — ABNORMAL LOW (ref 180–914)

## 2021-12-26 MED ORDER — CYANOCOBALAMIN 1000 MCG/ML IJ SOLN
1000.0000 ug | Freq: Every day | INTRAMUSCULAR | Status: DC
  Administered 2021-12-27: 1000 ug via INTRAMUSCULAR
  Filled 2021-12-26 (×2): qty 1

## 2021-12-26 MED ORDER — ALBUMIN HUMAN 25 % IV SOLN
25.0000 g | Freq: Once | INTRAVENOUS | Status: AC
  Administered 2021-12-26: 25 g via INTRAVENOUS
  Filled 2021-12-26: qty 100

## 2021-12-26 MED ORDER — FUROSEMIDE 40 MG PO TABS
40.0000 mg | ORAL_TABLET | Freq: Every day | ORAL | Status: DC
  Administered 2021-12-26 – 2021-12-27 (×2): 40 mg via ORAL
  Filled 2021-12-26 (×2): qty 1

## 2021-12-26 MED ORDER — OCTREOTIDE ACETATE 50 MCG/ML IJ SOLN
100.0000 ug | Freq: Three times a day (TID) | INTRAMUSCULAR | 0 refills | Status: AC
  Filled 2021-12-26: qty 180, 30d supply, fill #0

## 2021-12-26 NOTE — TOC Benefit Eligibility Note (Signed)
Transition of Care [facility]) Benefit Eligibility Note    Patient Details  Name: Cynthia [name] MRN: [id number] Date of Birth: 08/28/1961   Medication/Dose: Octreotide 100 mcg q8hrs x one month  Covered?: Yes        Spoke with Person/Company/Phone Number:: [place] [phone]  Co-Pay: $4.00  Prior Approval: No  Deductible: Met  Additional Notes: Rx needs to be called in it has to ordered not in [place], [place] Phone Number: 12/26/2021, 3:11 PM

## 2021-12-26 NOTE — Progress Notes (Signed)
PROGRESS NOTE    [name]  PPG:[id number] DOB: 12/18/1960 DOA: 12/12/2021 PCP: Denita Lung, MD  Chief Complaint  Patient presents with   Abdominal Pain   Stoma Prolapse     Brief Narrative:  Cynthia [name] is Cynthia [name] 61 y.o. female with medical history significant of anxiety, depression, bronchitis, GERD, migraine headaches, hiatal hernia, mitral valve prolapse, hypertension, IBS, digital nonintrusive session, restless leg syndrome, partial bowel resection with colostomy who is coming to the emergency department with abdominal pain in stoma site, occasional nausea, lightheadedness, fatigue, frontal headache, dehydration in the setting of recent increase in colostomy output.  Of note, she was discharged in December after admission from 12/15-12/24 with high output from her ostomy, but it doesn't sound like she filled many of her prescriptions at discharge?    Continued issues with high output ostomy and hypoglycemia.   Surgery and GI following.  See below for additional details      Assessment & Plan:   Principal Problem:   High output ileostomy (HCC) Active Problems:   AKI (acute kidney injury) ([place])   Volume overload   Benign essential HTN   Hypotension   Abdominal pain   Gastroesophageal reflux disease   Anemia   Hypoglycemia   Leukocytosis   Hyponatremia   Malnutrition of moderate degree   Assessment and Plan: * High output ileostomy ([place]) ~1 L out yesterday Cholestyramine, metamucil, lomotil, imodium.  Now on octreotide. Seems to be doing better with octreotide Discussed pentasa with GI earlier in admission, will hold off given nonadherence  last admission was on tincture of opium, imodium, iron, fibercon, lomotil, choleystramine and mesalamine (per her med rec, she doesn't appear to be taking these at this time -> discussed with her and pharmacy, sounds like she only was taking lomotil and imodium?) CT abdomen pelvis without acute intraabdominal  pathology Unclear cause for increased output  Follow fecal lactoferrin - negative GI consult, appreciate assistance Surgery c/s, appreciate recs Ostomy nurse working on prism ostomy supplies Will need to make sure she'll be able to get all meds prior to discharge Strict I/O, daily weights   Volume overload Bilateral LE edema, stop IVF Start lasix, give dose of albumin with hypoalbuminemia Venous US negative for DVT Echo 01/2021 with grade 1 diastolic dysfunction, EF 81-18% UA to eval for protein pending  AKI (acute kidney injury) ([place])- (present on admission) Renal function is fluctuating Up to 1.46 today, has lower extremity edema, apparent overload Follow with diuresis Baseline creatinine appears to be ~1.1 Strict I/O, daily weights  Hypotension- (present on admission) Improved, follow back on lasix  Benign essential HTN- (present on admission) Hold antihypertensives (lasix)  Abdominal pain- (present on admission) Follow RUQ [place] no evidence cholecystitis Lipase peaked to 194, resolved - ? Mild pancreatitis, this wasn't reflected on imaging - unclear significance  resolved    Hypoglycemia Recurrent, mild this AM, 12/26/21 Not on any insulin or any hypoglycemic meds  Anemia downtrending H/H trend b12 deficiency, supplement   Gastroesophageal reflux disease- (present on admission) PPI   DVT prophylaxis: lovenox Code Status: full Family Communication: none Disposition:   Status is: Inpatient Remains inpatient appropriate because: high output ostomy   Consultants:  GI surgery  Procedures:  LE [place] Summary:  BILATERAL:  - No evidence of deep vein thrombosis seen in the lower extremities,  bilaterally.  - No evidence of superficial venous thrombosis in the lower extremities,  bilaterally.  -No evidence of popliteal cyst, bilaterally.  - Subcutaneous  edema seen throughout bilateral lower extremities.  Antimicrobials:  Anti-infectives (From admission,  onward)    None      Subjective: No new complaints  Objective: Vitals:   12/25/21 2029 12/26/21 0445 12/26/21 0500 12/26/21 1304  BP: 102/84 111/67  113/65  Pulse: 86 74  77  Resp: 16 18  18   Temp: 98.8 F (37.1 C) 98.6 F (37 C)  98.6 F (37 C)  TempSrc: Oral Oral  Oral  SpO2: 100% 100%  100%  Weight:   67.9 kg   Height:        Intake/Output Summary (Last 24 hours) at 12/26/2021 1521 Last data filed at 12/26/2021 1400 Gross per 24 hour  Intake 1840 ml  Output 1551 ml  Net 289 ml   Filed Weights   12/24/21 0500 12/25/21 0602 12/26/21 0500  Weight: 65.7 kg 65.8 kg 67.9 kg    Examination:  General: No acute distress. Cardiovascular: RRR Lungs: unlabored Abdomen: Soft, nontender, nondistended - ostomy with semiformed light brown stool Neurological: Alert and oriented 3. Moves all extremities 4. Cranial nerves II through XII grossly intact. Skin: Warm and dry. No rashes or lesions. Extremities: bilateral le edema   Data Reviewed: I have personally reviewed following labs and imaging studies  CBC: Recent Labs  Lab 12/24/21 0420 12/25/21 0444 12/26/21 0442  WBC 6.1 5.8 5.6  NEUTROABS 3.4 3.0 3.0  HGB 7.9* 7.8* 7.6*  HCT 26.2* 26.4* 25.1*  MCV 98.1 98.1 97.7  PLT 218 222 177    Basic Metabolic Panel: Recent Labs  Lab 12/22/21 0414 12/23/21 0745 12/24/21 0420 12/25/21 0444 12/26/21 0442  NA 136 136 137 138 136  K 4.3 4.1 4.6 4.8 4.9  CL 107 111 112* 113* 112*  CO2 26 22 20* 22 21*  GLUCOSE 84 85 68* 85 72  BUN 32* 28* 26* 33* 29*  CREATININE 1.22* 1.14* 1.24* 1.25* 1.46*  CALCIUM 8.6* 8.4* 8.3* 8.5* 8.4*  MG  --   --   --  1.4* 2.1  PHOS  --   --   --   --  3.4    GFR: Estimated Creatinine Clearance: 37.8 mL/min (Ayeisha Lindenberger) (by C-G formula based on SCr of 1.46 mg/dL (H)).  Liver Function Tests: Recent Labs  Lab 12/21/21 0529 12/22/21 0414 12/24/21 0420 12/25/21 0444 12/26/21 0442  AST 16 19 15  14* 16  ALT 17 20 17 15 15   ALKPHOS 40 42  37* 41 40  BILITOT 0.2* 0.3 <0.1* 0.2* 0.1*  PROT 5.3* 5.6* 5.3* 5.6* 5.3*  ALBUMIN 2.6* 2.8* 2.8* 2.8* 2.6*    CBG: Recent Labs  Lab 12/25/21 1619 12/25/21 2030 12/26/21 0722 12/26/21 0814 12/26/21 1132  GLUCAP 83 82 61* 96 78     No results found for this or any previous visit (from the past 240 hour(s)).       Radiology Studies: VAS [place] LOWER EXTREMITY VENOUS (DVT)  Result Date: 12/25/2021  Lower Venous DVT Study Patient Name:  RANDA RISS [facility]  Date of Exam:   12/25/2021 Medical Rec #: [id number]          Accession #:    [id number] Date of Birth: 1960/12/02         Patient Gender: F Patient Age:   39 years Exam Location:  [facility] Procedure:      VAS [place] LOWER EXTREMITY VENOUS (DVT) Referring Phys: Quirino Kakos POWELL JR --------------------------------------------------------------------------------  Indications: Edema.  Comparison Study: Previous exam on 10/22/2021 was negative for DVT  Performing Technologist: Rogelia Rohrer RVT, RDMS  Examination Guidelines: Khalil Belote complete evaluation includes B-mode imaging, spectral Doppler, color Doppler, and power Doppler as needed of all accessible portions of each vessel. Bilateral testing is considered an integral part of [place] complete examination. Limited examinations for reoccurring indications may be performed as noted. The reflux portion of the exam is performed with the patient in reverse Trendelenburg.  +---------+---------------+---------+-----------+----------+--------------+  RIGHT     Compressibility Phasicity Spontaneity Properties Thrombus Aging  +---------+---------------+---------+-----------+----------+--------------+  CFV       Full            Yes       Yes                                    +---------+---------------+---------+-----------+----------+--------------+  SFJ       Full                                                             +---------+---------------+---------+-----------+----------+--------------+  FV Prox   Full             Yes       Yes                                    +---------+---------------+---------+-----------+----------+--------------+  FV Mid    Full            Yes       Yes                                    +---------+---------------+---------+-----------+----------+--------------+  FV Distal Full            Yes       Yes                                    +---------+---------------+---------+-----------+----------+--------------+  PFV       Full                                                             +---------+---------------+---------+-----------+----------+--------------+  POP       Full            Yes       Yes                                    +---------+---------------+---------+-----------+----------+--------------+  PTV       Full                                                             +---------+---------------+---------+-----------+----------+--------------+  PERO      Full                                                             +---------+---------------+---------+-----------+----------+--------------+   +---------+---------------+---------+-----------+----------+-------------------+  LEFT      Compressibility Phasicity Spontaneity Properties Thrombus Aging       +---------+---------------+---------+-----------+----------+-------------------+  CFV       Full            Yes       Yes                                         +---------+---------------+---------+-----------+----------+-------------------+  SFJ       Full                                                                  +---------+---------------+---------+-----------+----------+-------------------+  FV Prox   Full            Yes       Yes                                         +---------+---------------+---------+-----------+----------+-------------------+  FV Mid    Full            Yes       Yes                                         +---------+---------------+---------+-----------+----------+-------------------+  FV Distal Full             Yes       Yes                                         +---------+---------------+---------+-----------+----------+-------------------+  PFV       Full                                                                  +---------+---------------+---------+-----------+----------+-------------------+  POP       Full            Yes       Yes                                         +---------+---------------+---------+-----------+----------+-------------------+  PTV       Full                                                                  +---------+---------------+---------+-----------+----------+-------------------+  PERO      Full                                             Not well visualized  +---------+---------------+---------+-----------+----------+-------------------+     Summary: BILATERAL: - No evidence of deep vein thrombosis seen in the lower extremities, bilaterally. - No evidence of superficial venous thrombosis in the lower extremities, bilaterally. -No evidence of popliteal cyst, bilaterally. RIGHT: - Ultrasound characteristics of enlarged lymph nodes are noted in the groin.  - Subcutaneous edema seen throughout bilateral lower extremities. LEFT: - Ultrasound characteristics of enlarged lymph nodes noted in the groin.  *See table(s) above for measurements and observations. Electronically signed by Harold Barban MD on 12/25/2021 at 10:31:55 PM.    Final         Scheduled Meds:  cholestyramine light  4 g Oral TID   cyanocobalamin  1,000 mcg Intramuscular Daily   diphenoxylate-atropine  1 tablet Oral QID   enoxaparin (LOVENOX) injection  40 mg Subcutaneous Daily   ferrous sulfate  325 mg Oral BID WC   furosemide  40 mg Oral Daily   loperamide  4 mg Oral BID   melatonin  3 mg Oral QHS   multivitamin with minerals  1 tablet Oral Daily   octreotide  100 mcg Subcutaneous Q8H   polycarbophil  625 mg Oral BID   Continuous Infusions:  albumin human       LOS: 13 days    Time spent:  over 30 min    Fayrene Helper, MD [facility]   To contact the attending provider between 7A-7P or the covering provider during after hours 7P-7A, please log into the web site [email or web address] and access using universal [place] password for that web site. If you do not have the password, please call the hospital operator.  12/26/2021, 3:21 PM

## 2021-12-26 NOTE — Progress Notes (Signed)
Nutrition Follow-up  DOCUMENTATION CODES:   Non-severe (moderate) malnutrition in context of chronic illness  INTERVENTION:   -Encourage continued good PO intakes  -Multivitamin with minerals daily  NUTRITION DIAGNOSIS:   Moderate Malnutrition related to chronic illness (recurring high output ileostomy) as evidenced by mild fat depletion, mild muscle depletion.  Ongoing.  GOAL:   Patient will meet greater than or equal to 90% of their needs  Progressing  MONITOR:   PO intake, Supplement acceptance, Labs, Weight trends, Skin, I & O's   ASSESSMENT:   61 yo female with a PMH of anxiety, depression, bronchitis, GERD, migraine headaches, hiatal hernia, mitral valve prolapse, hypertension, IBS, digital nonintrusive session, restless leg syndrome, partial bowel resection with colostomy who is coming to the emergency department with abdominal pain in stoma site, occasional nausea, lightheadedness, fatigue, frontal headache, dehydration in the setting of recent increase in colostomy output.  Of note, she was discharged in December after admission from 12/15-12/24 with high output from her ostomy.  Patient currently consuming 100% of meals at this time.   Admission weight: 113 lbs Current weight: 149 lbs  I/Os: +20.2L since admit UOP: 1300 ml x 24 hrs Ostomy: 650 ml  Medications: Lomotil, Ferrous sulfate, Lasix, Imodium, Multivitamin with minerals daily, Fibercon, Octreotide,   Labs reviewed:  CBGs: 61-96 Low Vit B-12  Diet Order:   Diet Order             Diet - low sodium heart healthy           Diet regular Room service appropriate? Yes; Fluid consistency: Thin  Diet effective now                   EDUCATION NEEDS:   Education needs have been addressed  Skin:  Skin Assessment: Reviewed RN Assessment  Last BM:  2/23 -ileostomy  Height:   Ht Readings from Last 1 Encounters:  12/13/21 5\' 4"  (1.626 m)    Weight:   Wt Readings from Last 1 Encounters:   12/26/21 67.9 kg    BMI:  Body mass index is 25.69 kg/m.  Estimated Nutritional Needs:   Kcal:  2000-2200  Protein:  80-95 grams  Fluid:  >2 L  Clayton Bibles, MS, RD, LDN Inpatient Clinical Dietitian Contact information available via Amion

## 2021-12-26 NOTE — Progress Notes (Signed)
[facility] Progress Note  Cynthia [name] PXTGGYIR 61 y.o. 1961-03-05  CC:  Crohn's disease s/p ileostomy, high output ileostomy   Subjective: Patient resting in bed this morning.  States she is continuing to feel better and ostomy output continuing to decrease.  States she is emptying about 4 times a day. Denies nausea, vomiting, fever, chills.  Does have some abdominal cramping intermittently.  Continuing to tolerate her diet well.  ROS : Review of Systems  Constitutional:  Negative for chills and fever.  Gastrointestinal:  Positive for abdominal pain (cramping intermittently). Negative for blood in stool, heartburn, melena, nausea and vomiting.  Genitourinary:  Negative for dysuria and urgency.     Objective: Vital signs in last 24 hours: Vitals:   12/26/21 0445 12/26/21 1304  BP: 111/67 113/65  Pulse: 74 77  Resp: 18 18  Temp: 98.6 F (37 C) 98.6 F (37 C)  SpO2: 100% 100%    Physical Exam:  General:  Alert, cooperative, no distress  Head:  Normocephalic, without obvious abnormality, atraumatic  Eyes:  Anicteric sclera, EOM's intact  Lungs:   Clear to auscultation bilaterally, respirations unlabored  Heart:  Regular rate and rhythm, S1, S2 normal  Abdomen:   Soft, non-tender, bowel sounds active all four quadrants,  no masses, green/brown output in ostomy bag  Extremities: Bilateral LE edema       Lab Results: Recent Labs    12/25/21 0444 12/26/21 0442  NA 138 136  K 4.8 4.9  CL 113* 112*  CO2 22 21*  GLUCOSE 85 72  BUN 33* 29*  CREATININE 1.25* 1.46*  CALCIUM 8.5* 8.4*  MG 1.4* 2.1  PHOS  --  3.4   Recent Labs    12/25/21 0444 12/26/21 0442  AST 14* 16  ALT 15 15  ALKPHOS 41 40  BILITOT 0.2* 0.1*  PROT 5.6* 5.3*  ALBUMIN 2.8* 2.6*   Recent Labs    12/25/21 0444 12/26/21 0442  WBC 5.8 5.6  NEUTROABS 3.0 3.0  HGB 7.8* 7.6*  HCT 26.4* 25.1*  MCV 98.1 97.7  PLT 222 213   No results for input(s): LABPROT, INR in the last 72  hours.    Assessment High output ileostomy -Ileostomy s/p 02/2021 -Reports emptying bag 4 times daily -BUN 29, creatinine 1.46 -No leukocytosis - Hgb 7.6   Plan: Continue Octreotide 100 mcg every 8 hours. [facility] will follow.  Angelique Holm PA-C 12/26/2021, 1:52 PM  Contact #  [phone]

## 2021-12-27 ENCOUNTER — Other Ambulatory Visit (HOSPITAL_COMMUNITY): Payer: Self-pay

## 2021-12-27 DIAGNOSIS — Z932 Ileostomy status: Secondary | ICD-10-CM | POA: Diagnosis not present

## 2021-12-27 DIAGNOSIS — R198 Other specified symptoms and signs involving the digestive system and abdomen: Secondary | ICD-10-CM | POA: Diagnosis not present

## 2021-12-27 LAB — CBC
HCT: 28.2 % — ABNORMAL LOW (ref 36.0–46.0)
Hemoglobin: 8.5 g/dL — ABNORMAL LOW (ref 12.0–15.0)
MCH: 29.7 pg (ref 26.0–34.0)
MCHC: 30.1 g/dL (ref 30.0–36.0)
MCV: 98.6 fL (ref 80.0–100.0)
Platelets: 253 10*3/uL (ref 150–400)
RBC: 2.86 MIL/uL — ABNORMAL LOW (ref 3.87–5.11)
RDW: 13.9 % (ref 11.5–15.5)
WBC: 6.3 10*3/uL (ref 4.0–10.5)
nRBC: 0 % (ref 0.0–0.2)

## 2021-12-27 LAB — BASIC METABOLIC PANEL
Anion gap: 5 (ref 5–15)
BUN: 35 mg/dL — ABNORMAL HIGH (ref 8–23)
CO2: 23 mmol/L (ref 22–32)
Calcium: 9 mg/dL (ref 8.9–10.3)
Chloride: 111 mmol/L (ref 98–111)
Creatinine, Ser: 1.41 mg/dL — ABNORMAL HIGH (ref 0.44–1.00)
GFR, Estimated: 42 mL/min — ABNORMAL LOW (ref >60)
Glucose, Bld: 84 mg/dL (ref 70–99)
Potassium: 4.9 mmol/L (ref 3.5–5.1)
Sodium: 139 mmol/L (ref 135–145)

## 2021-12-27 LAB — GLUCOSE, CAPILLARY
Glucose-Capillary: 106 mg/dL — ABNORMAL HIGH (ref 70–99)
Glucose-Capillary: 66 mg/dL — ABNORMAL LOW (ref 70–99)
Glucose-Capillary: 74 mg/dL (ref 70–99)
Glucose-Capillary: 83 mg/dL (ref 70–99)
Glucose-Capillary: 78 mg/dL (ref 70–99)

## 2021-12-27 LAB — MAGNESIUM: Magnesium: 1.9 mg/dL (ref 1.7–2.4)

## 2021-12-27 MED ORDER — VITAMIN B-12 1000 MCG PO TABS: 1000.0000 ug | ORAL_TABLET | Freq: Every day | ORAL | 0 refills | Status: AC

## 2021-12-27 MED ORDER — "SYRINGE/NEEDLE (DISP) 25G X 5/8"" 3 ML MISC"
0 refills | Status: DC
  Filled 2021-12-27: qty 90, 30d supply, fill #0

## 2021-12-27 MED ORDER — DIPHENOXYLATE-ATROPINE 2.5-0.025 MG PO TABS: 1.0000 | ORAL_TABLET | Freq: Four times a day (QID) | ORAL | 0 refills | Status: DC

## 2021-12-27 MED ORDER — FUROSEMIDE 40 MG PO TABS: 40.0000 mg | ORAL_TABLET | Freq: Every day | ORAL | 0 refills | Status: DC

## 2021-12-27 MED ORDER — CALCIUM POLYCARBOPHIL 625 MG PO TABS: 625.0000 mg | ORAL_TABLET | Freq: Two times a day (BID) | ORAL | 0 refills | Status: AC

## 2021-12-27 NOTE — Progress Notes (Signed)
Reviewed written d/c instructions w pt very carefully, med times written out on form for pt, she verbalized understanding. Pt states that she will get her roommate to go by her pharmacy and pick up her 4 rx'es - Lasix, B12, fibercon and Lomotil (due in the am). Pt has her OTC iron, Prevalite and Imodium w her belongings, also has her syringes and Sandostatin as well. Pt has 5 full ileostomy changes/supplies as well. Reviewed pt appts w pt and stressed the importance of f/u w surgeon in [place] and GI doc here, she again verbalized understanding. D/c via w/c w all belongings and multiple supplies in stable condition.

## 2021-12-27 NOTE — Progress Notes (Signed)
Patient educated on administering medication: octreotide (Sandostatin) injection subcutaneous. Patient verbalized via teach back with demonstration (administered own injection with cleaning prep).

## 2021-12-27 NOTE — Progress Notes (Signed)
[facility] Progress Note     Subjective: CC:  No complaints this morning.  Ostomy output seems to be slowing down.  Objective: Vital signs in last 24 hours: Temp:  [98.1 F (36.7 C)-98.6 F (37 C)] 98.1 F (36.7 C) (02/24 0428) Pulse Rate:  [69-79] 69 (02/24 0428) Resp:  [18-20] 18 (02/24 0428) BP: (113-141)/(65-83) 141/83 (02/24 0428) SpO2:  [100 %] 100 % (02/24 0428) Weight:  [67.1 kg-70.7 kg] 70.7 kg (02/24 0536) Last BM Date : 12/26/21  Intake/Output from previous day: 02/23 0701 - 02/24 0700 In: 2336.7 [P.O.:2320; IV Piggyback:16.7] Out: 1101 [Urine:1101] Intake/Output this shift: No intake/output data recorded.  PE: Gen:  Alert, NAD, pleasant Card:  Regular rate and rhythm, pedal pulses 2+ BL Pulm:  Normal effort, clear to auscultation bilaterally Abd: Soft, non-tender, non-distended, +BS, LUQ mucous fistula with bandage over it- scant mucous on bandage, periwound in tact. Ileostomy viable putting out pudding consistency stool Skin: warm and dry, no rashes  Psych: A&Ox3   Lab Results:  Recent Labs    12/26/21 0442 12/27/21 0447  WBC 5.6 6.3  HGB 7.6* 8.5*  HCT 25.1* 28.2*  PLT 213 253    BMET Recent Labs    12/26/21 0442 12/27/21 0447  NA 136 139  K 4.9 4.9  CL 112* 111  CO2 21* 23  GLUCOSE 72 84  BUN 29* 35*  CREATININE 1.46* 1.41*  CALCIUM 8.4* 9.0    PT/INR No results for input(s): LABPROT, INR in the last 72 hours. CMP     Component Value Date/Time   NA 139 12/27/2021 0447   NA 136 07/19/2021 1324   K 4.9 12/27/2021 0447   CL 111 12/27/2021 0447   CO2 23 12/27/2021 0447   GLUCOSE 84 12/27/2021 0447   BUN 35 (H) 12/27/2021 0447   BUN 25 07/19/2021 1324   CREATININE 1.41 (H) 12/27/2021 0447   CREATININE 0.94 05/08/2016 0741   CALCIUM 9.0 12/27/2021 0447   PROT 5.3 (L) 12/26/2021 0442   PROT 5.5 (L) 07/19/2021 1324   ALBUMIN 2.6 (L) 12/26/2021 0442   ALBUMIN 3.4 (L) 07/19/2021 1324   AST 16 12/26/2021 0442   ALT  15 12/26/2021 0442   ALKPHOS 40 12/26/2021 0442   BILITOT 0.1 (L) 12/26/2021 0442   BILITOT <0.2 07/19/2021 1324   GFRNONAA 42 (L) 12/27/2021 0447   GFRAA 77 12/18/2020 1548   Lipase     Component Value Date/Time   LIPASE 44 12/18/2021 0440   Anti-infectives: Anti-infectives (From admission, onward)    None      Assessment/Plan High output ileostomy  Cynthia [name] is an 61 y.o. 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  Underwent ex-lap/ileocecectomy and sigmoid colectomy; descending colon mucous fistula; 02/2021 at [facility] [place] Dr. Hilliard Clark - afebrile, VSS  -Current regimen seems to be working well - Maintenance IV fluids/supportive fluids.   - Last albumin 2.8; prealbumin 29.2 - reassuring. - GI for evaluation of both rectum/remnant sigmoid as well as the rest of colon through mucus fistula. Would also appreciate GI recs on medical management of crohn's moving forward.  - greatly appreciate WOC and CM insight and assistance, our teeam spoke to [place] CSW 2/20 AM in regards to patients current insurance (medicaid [place] access) coverage and will continue close communications with her.  - no urgent, emergent surgical needs. Need to control her high output ileostomy with a regimen that is sustainable at home. Would need endoscopic evaluation  of her colon prior to further surgical planning. Ideally she could follow up with Dr. Hilliard Clark at [place].   We will see again Monday if the patient is still admitted.   LOS: 14 days   I reviewed nursing notes, hospitalist notes, last 24 h vitals and pain scores, last 48 h intake and output, last 24 h labs and trends, and last 24 h imaging results.  This care required moderate level of medical decision making.   [facility] Please see Amion for floor coverage during day hours 7:00am-4:30pm

## 2021-12-27 NOTE — Progress Notes (Signed)
Rec'd sandostatin med and syringes from [facility]. Instructed pt again on how to draw up and administer. Questions answered and pt drew up and gave herself her afternoon dose of this, observed good technique. Instructed pt to refrigerate this medicine and the times of day to administer. She verbalized understanding. Awaiting d/c orders

## 2021-12-27 NOTE — Progress Notes (Signed)
Hypoglycemic Event  CBG: 66  Treatment: 4 oz juice/soda  Symptoms: None  Follow-up CBG: Time: 0441 CBG Result: 74  Possible Reasons for Event: Unknown  Comments/MD notified: Cynthia Newcomer, NP (no further order given at this time)    Cynthia [name], Cynthia [name] L

## 2021-12-27 NOTE — Discharge Summary (Signed)
Physician Discharge Summary  Cynthia [name] ZOX:[id number] DOB: 1961/03/20 DOA: 12/12/2021  PCP: Denita Lung, MD  Admit date: 12/12/2021 Discharge date: 12/27/2021  Time spent: 40 minutes  Recommendations for Outpatient Follow-up:  Follow outpatient CBC/CMP  Follow enlarged LN on LE [place] outpatient Follow with Dr. 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 female with medical history significant of anxiety, depression, bronchitis, GERD, migraine headaches, hiatal hernia, mitral valve prolapse, hypertension, IBS, digital nonintrusive session, restless leg syndrome, partial bowel resection with colostomy who is coming to the emergency department with abdominal pain in stoma site, occasional nausea, lightheadedness, fatigue, frontal headache, dehydration in the setting of recent increase in colostomy output.  Of note, she was discharged in December after admission from 12/15-12/24 with high output from her ostomy, but it doesn't sound like she filled many of her prescriptions at discharge?    Continued issues with high output ostomy and hypoglycemia.   Surgery and GI following.  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  Now on octreotide. Seems to be doing better with octreotide.  She's had teaching regarding injections.  Will discharge with octreotide, imodium, lomotil, iron, fibercon, cholestyramine.  She should follow up with GI outpatient. Discussed pentasa with GI earlier in admission, will hold off given nonadherence  CT abdomen pelvis without acute intraabdominal pathology Unclear cause for increased output  Follow fecal lactoferrin - negative GI consult, appreciate assistance Surgery c/s, appreciate recs Ostomy nurse working on prism ostomy supplies Strict I/O, daily weights   Volume overload Bilateral LE edema, stop IVF Continue lasix at discharge Venous US negative for DVT Echo 01/2021 with grade 1 diastolic dysfunction, EF 81-19% UA without protein on 12/2021  AKI (acute kidney injury) ([place])- (present on admission) Renal function is fluctuating Renal function stable today, ~1.4, has lower extremity edema, apparent overload Continue oral diuresis at discharge (should hold if increased ostomy output) Baseline creatinine appears to be ~1.1 (but fluctuates pretty widely looking back, many values higher than 1.1) Strict I/O, daily weights  Hypotension- (present on admission) Improved, follow back on lasix  Benign essential HTN- (present on admission) Resume lasix at discharge  Abdominal pain- (present on admission) Follow RUQ [place] no evidence cholecystitis Lipase peaked to 194, resolved - ? Mild pancreatitis, this wasn't reflected on imaging - unclear significance  resolved    Hypoglycemia Mild to ~60's.  She's asymptomatic.    Anemia downtrending H/H trend b12 deficiency, supplement   Gastroesophageal reflux disease- (present on admission) PPI   Procedures: none   Consultations: Summary:  BILATERAL:  - No evidence of deep vein thrombosis seen in the lower extremities,  bilaterally.  -  No evidence of  superficial venous thrombosis in the lower extremities,  bilaterally.  -No evidence of popliteal cyst, bilaterally.  RIGHT:  - Ultrasound characteristics of enlarged lymph nodes are noted in the  groin.     - Subcutaneous edema seen throughout bilateral lower extremities.  LEFT:  - Ultrasound characteristics of enlarged lymph nodes noted in the groin.   Discharge Exam: Vitals:   12/26/21 2008 12/27/21 0428  BP: 115/66 (!) 141/83  Pulse: 79 69  Resp: 20 18  Temp: 98.4 F (36.9 C) 98.1 F (36.7 C)  SpO2: 100% 100%   No new complaints Eager for home, does have some help with roommate   General: No acute distress. Cardiovascular: RRR Lungs: unlabored Abdomen: Soft, nontender, nondistended  Neurological: Alert and oriented 3. Moves all extremities 4. Cranial nerves II through XII grossly intact. Skin: Warm and dry. No rashes or lesions. Extremities: mild LE edema  Discharge Instructions   Discharge Instructions     Amb Referral to Ostomy Clinic   Complete by: As directed    Reason for referral modifiers: Consultation for problems such as pouch leaking or skin irritation   Call MD for:  difficulty breathing, headache or visual disturbances   Complete by: As directed    Call MD for:  extreme fatigue   Complete by: As directed    Call MD for:  hives   Complete by: As directed    Call MD for:  persistant dizziness or light-headedness   Complete by: As directed    Call MD for:  persistant nausea and vomiting   Complete by: As directed    Call MD for:  redness, tenderness, or signs of infection (pain, swelling, redness, odor or green/yellow discharge around incision site)   Complete by: As directed    Call MD for:  severe uncontrolled pain   Complete by: As directed    Call MD for:  temperature >100.4   Complete by: As directed    Diet - low sodium heart healthy   Complete by: As directed    Diet - low sodium heart healthy   Complete by: As directed    Discharge  instructions   Complete by: As directed    You were seen for high ostomy output.  You've improved on the octreotide (the injection).  We'll discharge you with this medicine, as well as fibercon, iron, imodium, lomotil, and questran.  If you notice constipation, back off on the imodium and lomotil first and call your PCP or the gastroenterologist for further instructions.  Please follow up with gastroenterology as an outpatient.  I'll ask [facility] to help you schedule an appointment.  You should also follow up with Dr. Hilliard Clark outpatient to discuss outpatient workup for possible ileostomy reversal.   I've started you on lasix for your lower extremity swelling.  Follow repeat labs with your PCP within Teyton Pattillo week or so.  These will be important to follow your kidney function and electrolytes.  If you notice your ostomy output is high, hold your lasix.  If u change more that 4-5 bags in 2, that is an indication to increase your oral fluids (and hold your lasix) and maybe be seen by Takesha Steger medical professional for labs etc  You have b12 deficiency and should pick this up from the pharmacy and follow up with your PCP.  Return for new, recurrent, or worsening symptoms.  Please ask your PCP to request records from this hospitalization so they know what was done  and what the next steps will be.   Increase activity slowly   Complete by: As directed    Increase activity slowly   Complete by: As directed    No wound care   Complete by: As directed       Allergies as of 12/27/2021       Reactions   Iodine Other (See Comments)   The patient said she had Nikira Kushnir reaction from iodine in some eye drops- caused eyes to turn red;  pt has no reactions to CT contrast        Medication List     STOP taking these medications    cholestyramine 4 g packet Commonly known as: QUESTRAN Replaced by: cholestyramine light 4 g packet   ibuprofen 200 MG tablet Commonly known as: ADVIL   mesalamine 250 MG CR  capsule Commonly known as: PENTASA   Zinc Oxide 12.8 % ointment Commonly known as: TRIPLE PASTE       TAKE these medications    acetaminophen 500 MG tablet Commonly known as: TYLENOL Take 1,000 mg by mouth every 6 (six) hours as needed for headache or mild pain.   alum & mag hydroxide-simeth 200-200-20 MG/5ML suspension Commonly known as: MAALOX/MYLANTA Take 30 mLs by mouth every 4 (four) hours as needed for indigestion.   B-D 3CC LUER-LOK SYR 25GX5/8" 25G X 5/8" 3 ML Misc Generic drug: SYRINGE-NEEDLE (DISP) 3 ML Use as directed for octreotide injection   cholestyramine light 4 g packet Commonly known as: PREVALITE Take 1 packet (4 g total) by mouth 3 (three) times daily.  **Mix as directed prior to taking** Replaces: cholestyramine 4 g packet   diphenoxylate-atropine 2.5-0.025 MG tablet Commonly known as: LOMOTIL Take 1 tablet by mouth 4 (four) times daily.   ferrous sulfate 325 (65 FE) MG tablet Take 1 tablet (325 mg total) by mouth 2 (two) times daily with Miriah Maruyama meal.   furosemide 40 MG tablet Commonly known as: LASIX Take 1 tablet (40 mg total) by mouth daily. If your ostomy output is high, hold this medicine. Start taking on: December 28, 2021 What changed:  medication strength how much to take additional instructions   loperamide 2 MG capsule Commonly known as: IMODIUM Take 1 capsule (2 mg total) by mouth 3 (three) times daily as needed for diarrhea or loose stools.   octreotide 50 MCG/ML Soln injection Commonly known as: SANDOSTATIN Inject 2 mLs (100 mcg total) into the skin every 8 hours.   polycarbophil 625 MG tablet Commonly known as: FIBERCON Take 1 tablet (625 mg total) by mouth 2 (two) times daily.   vitamin B-12 1000 MCG tablet Commonly known as: CYANOCOBALAMIN Take 1 tablet (1,000 mcg total) by mouth daily.       Allergies  Allergen Reactions   Iodine Other (See Comments)    The patient said she had Dorise Gangi reaction from iodine in some eye drops-  caused eyes to turn red;  pt has no reactions to CT contrast    Follow-up Information     [facility]   Contact information: [place] [address] [phone]         Denita Lung, MD Follow up.   Specialty: Family Medicine Contact information: [place] [address] [phone]         Rexene Agent, MD Follow up.   Specialty: Colon and Rectal Surgery Why: You have an appointment on March 8th at 3:15 at [address], [place], [address],  Please call to confirm you can make this appointment. Contact information: [address] [address] [phone]                  The results of significant diagnostics from this hospitalization (including imaging, microbiology, ancillary and laboratory) are listed below for reference.    Significant Diagnostic Studies: CT ABDOMEN PELVIS WO CONTRAST  Result Date: 12/12/2021 CLINICAL DATA:  Right abdominal pain bowel obstruction. EXAM: CT ABDOMEN AND PELVIS WITHOUT CONTRAST TECHNIQUE: Multidetector CT imaging of the abdomen and pelvis was performed following the standard protocol without IV contrast. RADIATION DOSE REDUCTION: This exam was performed according to the departmental dose-optimization program which includes automated exposure control, adjustment of the mA and/or kV according to patient size and/or use of iterative reconstruction technique. COMPARISON:  10/24/2021 FINDINGS: Lower chest: No acute abnormality. Hepatobiliary: Cholelithiasis without pericholecystic inflammatory change identified. Liver unremarkable. No intra or extrahepatic biliary ductal dilation. Pancreas: Unremarkable Spleen: Unremarkable Adrenals/Urinary Tract: Adrenal glands are unremarkable. Kidneys are normal, without renal calculi, focal lesion, or hydronephrosis. Bladder is unremarkable. Stomach/Bowel: Subtotal colectomy with staple  lines noted within the mid ascending colon and mid sigmoid colon is identified with right lower quadrant diverting ileostomy again identified. The stomach, small bowel, and residual large bowel are unremarkable. No free intraperitoneal gas or fluid. No evidence of obstruction or focal inflammation. Vascular/Lymphatic: Aortic atherosclerosis. No enlarged abdominal or pelvic lymph nodes. Reproductive: Status post hysterectomy. No adnexal masses. Other: No abdominal wall hernia. Musculoskeletal: Degenerative changes are seen within the lumbar spine. No lytic or blastic bone lesion. IMPRESSION: No acute intra-abdominal pathology identified. No definite radiographic explanation for the patient's reported symptoms. 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 Cholelithiasis. Aortic Atherosclerosis (ICD10-I70.0). Electronically Signed   By: Fidela Salisbury M.D.   On: 12/12/2021 19:11   VAS [place] LOWER EXTREMITY VENOUS (DVT)  Result Date: 12/25/2021  Lower Venous DVT Study Patient Name:  PERMELIA BAMBA [facility]  Date of Exam:   12/25/2021 Medical Rec #: [id number]          Accession #:    [id number] Date of Birth: Jan 27, 1961         Patient Gender: F Patient Age:   61 years Exam Location:  [facility] Procedure:      VAS [place] LOWER EXTREMITY VENOUS (DVT) Referring Phys: Andrue Dini POWELL JR --------------------------------------------------------------------------------  Indications: Edema.  Comparison Study: Previous exam on 10/22/2021 was negative for DVT Performing Technologist: Rogelia Rohrer RVT, RDMS  Examination Guidelines: Almond Fitzgibbon complete evaluation includes B-mode imaging, spectral Doppler, color Doppler, and power Doppler as needed of all accessible portions of each vessel. Bilateral testing is considered an integral part of Randal Goens complete examination. Limited examinations for reoccurring indications may be performed as noted. The reflux portion of the exam is performed with the patient in reverse  Trendelenburg.  +---------+---------------+---------+-----------+----------+--------------+  RIGHT     Compressibility Phasicity Spontaneity Properties Thrombus Aging  +---------+---------------+---------+-----------+----------+--------------+  CFV       Full            Yes       Yes                                    +---------+---------------+---------+-----------+----------+--------------+  SFJ       Full                                                             +---------+---------------+---------+-----------+----------+--------------+  FV Prox   Full            Yes       Yes                                    +---------+---------------+---------+-----------+----------+--------------+  FV Mid    Full            Yes       Yes                                    +---------+---------------+---------+-----------+----------+--------------+  FV Distal Full            Yes       Yes                                    +---------+---------------+---------+-----------+----------+--------------+  PFV       Full                                                             +---------+---------------+---------+-----------+----------+--------------+  POP       Full            Yes       Yes                                    +---------+---------------+---------+-----------+----------+--------------+  PTV       Full                                                             +---------+---------------+---------+-----------+----------+--------------+  PERO      Full                                                             +---------+---------------+---------+-----------+----------+--------------+   +---------+---------------+---------+-----------+----------+-------------------+  LEFT      Compressibility Phasicity Spontaneity Properties Thrombus Aging       +---------+---------------+---------+-----------+----------+-------------------+  CFV       Full            Yes       Yes                                          +---------+---------------+---------+-----------+----------+-------------------+  SFJ       Full                                                                  +---------+---------------+---------+-----------+----------+-------------------+  FV Prox   Full            Yes       Yes                                         +---------+---------------+---------+-----------+----------+-------------------+  FV Mid    Full            Yes       Yes                                         +---------+---------------+---------+-----------+----------+-------------------+  FV Distal Full            Yes       Yes                                         +---------+---------------+---------+-----------+----------+-------------------+  PFV       Full                                                                  +---------+---------------+---------+-----------+----------+-------------------+  POP       Full            Yes       Yes                                         +---------+---------------+---------+-----------+----------+-------------------+  PTV       Full                                                                  +---------+---------------+---------+-----------+----------+-------------------+  PERO      Full                                             Not well visualized  +---------+---------------+---------+-----------+----------+-------------------+     Summary: BILATERAL: - No evidence of deep vein thrombosis seen in the lower extremities, bilaterally. - No evidence of superficial venous thrombosis in the lower extremities, bilaterally. -No evidence of popliteal cyst, bilaterally. RIGHT: - Ultrasound characteristics of enlarged lymph nodes are noted in the groin.  - Subcutaneous edema seen throughout bilateral lower extremities. LEFT: - Ultrasound characteristics of enlarged lymph nodes noted in the groin.  *See table(s) above for measurements and observations. Electronically signed by Harold Barban MD on  12/25/2021 at 10:31:55 PM.    Final    US Abdomen Limited RUQ (LIVER/GB)  Result Date: 12/16/2021 CLINICAL DATA:  Abdominal pain EXAM: ULTRASOUND ABDOMEN LIMITED RIGHT UPPER QUADRANT COMPARISON:  CT 12/12/2021 FINDINGS: Limited exam due to  the patient's ileostomy bag. Gallbladder: Gallbladder is nondilated with multiple intraluminal stones, largest measuring 1.6 cm. No wall thickening or pericholecystic fluid. Negative sonographic Murphy sign. Common bile duct: Diameter: 2.1 mm Liver: No focal lesion identified. Within normal limits in parenchymal echogenicity. Portal vein is patent on color Doppler imaging with normal direction of blood flow towards the liver. Other: None. IMPRESSION: Cholelithiasis.  No evidence of acute cholecystitis. Electronically Signed   By: Maurine Simmering M.D.   On: 12/16/2021 17:00    Microbiology: No results found for this or any previous visit (from the past 240 hour(s)).   Labs: Basic Metabolic Panel: Recent Labs  Lab 12/23/21 0745 12/24/21 0420 12/25/21 0444 12/26/21 0442 12/27/21 0447  NA 136 137 138 136 139  K 4.1 4.6 4.8 4.9 4.9  CL 111 112* 113* 112* 111  CO2 22 20* 22 21* 23  GLUCOSE 85 68* 85 72 84  BUN 28* 26* 33* 29* 35*  CREATININE 1.14* 1.24* 1.25* 1.46* 1.41*  CALCIUM 8.4* 8.3* 8.5* 8.4* 9.0  MG  --   --  1.4* 2.1 1.9  PHOS  --   --   --  3.4  --    Liver Function Tests: Recent Labs  Lab 12/21/21 0529 12/22/21 0414 12/24/21 0420 12/25/21 0444 12/26/21 0442  AST 16 19 15  14* 16  ALT 17 20 17 15 15   ALKPHOS 40 42 37* 41 40  BILITOT 0.2* 0.3 <0.1* 0.2* 0.1*  PROT 5.3* 5.6* 5.3* 5.6* 5.3*  ALBUMIN 2.6* 2.8* 2.8* 2.8* 2.6*   No results for input(s): LIPASE, AMYLASE in the last 168 hours. No results for input(s): AMMONIA in the last 168 hours. CBC: Recent Labs  Lab 12/24/21 0420 12/25/21 0444 12/26/21 0442 12/27/21 0447  WBC 6.1 5.8 5.6 6.3  NEUTROABS 3.4 3.0 3.0  --   HGB 7.9* 7.8* 7.6* 8.5*  HCT 26.2* 26.4* 25.1* 28.2*  MCV  98.1 98.1 97.7 98.6  PLT 218 222 213 253   Cardiac Enzymes: No results for input(s): CKTOTAL, CKMB, CKMBINDEX, TROPONINI in the last 168 hours. BNP: BNP (last 3 results) Recent Labs    10/31/21 1404 12/25/21 0439  BNP 369.2* 314.1*    ProBNP (last 3 results) No results for input(s): PROBNP in the last 8760 hours.  CBG: Recent Labs  Lab 12/26/21 2357 12/27/21 0425 12/27/21 0448 12/27/21 0742 12/27/21 1141  GLUCAP 106* 66* 74 83 78       Signed:  Fayrene Helper MD.  [facility] 12/27/2021, 2:04 PM

## 2021-12-30 ENCOUNTER — Telehealth: Payer: Self-pay

## 2021-12-30 NOTE — Telephone Encounter (Signed)
Transition Care Management Unsuccessful Follow-up Telephone Call  Date of discharge and from where:  12/27/2021 Cynthia [name]  Attempts:  1st Attempt  Reason for unsuccessful TCM follow-up call:  Unable to leave message

## 2022-01-02 ENCOUNTER — Inpatient Hospital Stay: Payer: Self-pay | Admitting: Medical

## 2022-01-03 ENCOUNTER — Ambulatory Visit: Payer: Medicaid Other | Admitting: Medical

## 2022-01-03 VITALS — BP 120/70 | HR 91 | Temp 98.6°F | Wt 137.8 lb

## 2022-01-03 DIAGNOSIS — D519 Vitamin B12 deficiency anemia, unspecified: Secondary | ICD-10-CM

## 2022-01-03 DIAGNOSIS — R609 Edema, unspecified: Secondary | ICD-10-CM

## 2022-01-03 DIAGNOSIS — N179 Acute kidney failure, unspecified: Secondary | ICD-10-CM | POA: Diagnosis not present

## 2022-01-03 DIAGNOSIS — E43 Unspecified severe protein-calorie malnutrition: Secondary | ICD-10-CM | POA: Diagnosis not present

## 2022-01-03 DIAGNOSIS — R198 Other specified symptoms and signs involving the digestive system and abdomen: Secondary | ICD-10-CM

## 2022-01-03 DIAGNOSIS — Z932 Ileostomy status: Secondary | ICD-10-CM

## 2022-01-03 LAB — POCT URINALYSIS DIP (PROADVANTAGE DEVICE)
Bilirubin, UA: NEGATIVE
Blood, UA: NEGATIVE
Glucose, UA: NEGATIVE mg/dL
Ketones, POC UA: NEGATIVE mg/dL
Nitrite, UA: NEGATIVE
Protein Ur, POC: NEGATIVE mg/dL
Specific Gravity, Urine: 1.01
Urobilinogen, Ur: NEGATIVE
pH, UA: 6 (ref 5.0–8.0)

## 2022-01-03 NOTE — Progress Notes (Signed)
Subjective: ? Cynthia [name] is a 61 y.o. female who presents for ?Chief Complaint  ?Patient presents with  ? Hospitalization Follow-up  ?  Hospital follow-up- from dehyrdation and kidney function. Would like to get a refill on effexor 162m.  ?  ?   ?Here alone today for hospital follow-up. ? ?She was hospitalized February 9 through December 27, 2021 ? ?Discharge Diagnoses:  ?Principal Problem: ?  High output ileostomy ([place] ?Active Problems: ?  AKI (acute kidney injury) ([place] ?  Volume overload ?  Benign essential HTN ?  Hypotension ?  Abdominal pain ?  Gastroesophageal reflux disease ?  Anemia ?  Hypoglycemia ?  Leukocytosis ?  Hyponatremia ?  Malnutrition of moderate degree ? ?She notes that she went to the emergency department abdominal pain, nausea, lightheadedness, fatigue, headache, dehydration, and lots of fluid output in her colostomy bag.  She was started to get an infection around the opening. ? ?She also has B12 deficiency.  She takes B12 supplement daily 1000 mcg daily oral ? ?At the hospitalization she was started on octreotide 3 shots daily to help thicken the volumous liquid ostomy contents. ? ?She has lost some weight since discharge and feels little dehydrated.  So far she has gotten a little bit of thickening to the ostomy contents.  She is doing a 3 octreotide shots a day, but they are painful ? ?She is not checking weights at home ? ?She did not think she is supposed to take Lasix after the hospitalization so she did not take 1 until today.  She does have swelling in her legs and today is the first time she took Lasix since discharge ? ?She has been using some Tylenol PM for sleep. ? ?No other aggravating or relieving factors.   ? ?No other c/o. ? ?The following portions of the patient's history were reviewed and updated as appropriate: allergies, current medications, past family history, past medical history, past social history, past surgical history and problem list. ? ?ROS ?Otherwise as  in subjective above ? ?Objective: ?BP 120/70   Pulse 91   Temp 98.6 ?F (37 ?C)   Wt 137 lb 12.8 oz (62.5 kg)   LMP 06/10/2011 (Exact Date)   BMI 23.65 kg/m?  ? ?Wt Readings from Last 3 Encounters:  ?01/03/22 137 lb 12.8 oz (62.5 kg)  ?12/27/21 155 lb 13.8 oz (70.7 kg)  ?11/01/21 105 lb (47.6 kg)  ? ? ?General appearance: alert, no distress, well developed, well nourished ?Oral cavity: MMM, no lesions ?Neck: supple, no lymphadenopathy, no thyromegaly, no masses, no JVD or bruit ?Heart: RRR, normal S1, S2, no murmurs ?Lungs: CTA bilaterally, no wheezes, rhonchi, or rales ?Abdomen: +bs, soft, non tender, colostomy present, non distended, no masses, no hepatomegaly, no splenomegaly ?Pulses: 2+ radial pulses, 2+ pedal pulses, normal cap refill ?Ext: 1-2+ bilateral lower extremity edema pitting ? ? ?Assessment: ?Encounter Diagnoses  ?Name Primary?  ? Unspecified severe protein-calorie malnutrition ([place]  ? [place]ostomy ([place] [place]   ? AKI (acute kidney injury) ([facility]   ? Anemia due to vitamin B12 deficiency, unspecified B12 deficiency type   ? 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[facility]   ? Edema, unspecified type   ? ? ? ?Plan: ?I reviewed her hospitalization records, medications were reconciled.  Reviewed recent labs.  I reviewed recent CT abdomen pelvis from December 12, 2021 as well as ultrasound of abdomen from 02/13/2022  ? ?She has lost 18lb since hospitali discharge, but looking back her weights have fluctuated a  lot in the last year or so.  She has had several hospitalizations in the last year as well ? ?She has follow-up appointment on March 8 to see the surgeon regarding high output ostomy ? ?She is planning to call gastroenterology for GI follow-up but she does not have that appointment established yet ? ?Labs as below today. ? ?Continue B12 supplement.  Consider monthly injections going forward.  We can recheck the B12 in a few weeks ? ? ?Cynthia [name] was seen today for hospitalization follow-up. ? ?Diagnoses and all orders  for this visit: ? ?Unspecified severe protein-calorie malnutrition ([place]) ?-     Comprehensive metabolic panel ?-     CBC ?-     POCT Urinalysis DIP (Proadvantage Device) ? 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 ?AKI (acute kidney injury) ([place]) ?-     Comprehensive metabolic panel ?-     CBC ?-     POCT Urinalysis DIP (Proadvantage Device) ? ?Anemia due to vitamin B12 deficiency, unspecified B12 deficiency type ?-     CBC ? ?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[place]) ? ?Edema, unspecified type ?-     Comprehensive metabolic panel ?-     POCT Urinalysis DIP (Proadvantage Device) ? ? ? ?Follow up: pending labs ?

## 2022-01-04 LAB — COMPREHENSIVE METABOLIC PANEL
ALT: 9 IU/L (ref 0–32)
AST: 19 IU/L (ref 0–40)
Albumin/Globulin Ratio: 1.8 (ref 1.2–2.2)
Albumin: 4.5 g/dL (ref 3.8–4.8)
Alkaline Phosphatase: 73 IU/L (ref 44–121)
BUN/Creatinine Ratio: 13 (ref 12–28)
BUN: 18 mg/dL (ref 8–27)
Bilirubin Total: 0.3 mg/dL (ref 0.0–1.2)
CO2: 17 mmol/L — ABNORMAL LOW (ref 20–29)
Calcium: 9.4 mg/dL (ref 8.7–10.3)
Chloride: 105 mmol/L (ref 96–106)
Creatinine, Ser: 1.39 mg/dL — ABNORMAL HIGH (ref 0.57–1.00)
Globulin, Total: 2.5 g/dL (ref 1.5–4.5)
Glucose: 126 mg/dL — ABNORMAL HIGH (ref 70–99)
Potassium: 4.5 mmol/L (ref 3.5–5.2)
Sodium: 141 mmol/L (ref 134–144)
Total Protein: 7 g/dL (ref 6.0–8.5)
eGFR: 43 mL/min/{1.73_m2} — ABNORMAL LOW (ref >59)

## 2022-01-04 LAB — CBC
Hematocrit: 29.7 % — ABNORMAL LOW (ref 34.0–46.6)
Hemoglobin: 9.8 g/dL — ABNORMAL LOW (ref 11.1–15.9)
MCH: 29.5 pg (ref 26.6–33.0)
MCHC: 33 g/dL (ref 31.5–35.7)
MCV: 90 fL (ref 79–97)
Platelets: 350 10*3/uL (ref 150–450)
RBC: 3.32 x10E6/uL — ABNORMAL LOW (ref 3.77–5.28)
RDW: 12.7 % (ref 11.7–15.4)
WBC: 6.1 10*3/uL (ref 3.4–10.8)

## 2022-01-30 ENCOUNTER — Other Ambulatory Visit: Payer: Self-pay

## 2022-01-30 ENCOUNTER — Emergency Department (HOSPITAL_COMMUNITY): Payer: Medicaid Other

## 2022-01-30 ENCOUNTER — Emergency Department (HOSPITAL_COMMUNITY)
Admission: EM | Admit: 2022-01-30 | Discharge: 2022-01-30 | Disposition: A | Discharge status: Home / Self Care | Payer: Medicaid Other | Attending: Emergency Medicine | Admitting: Emergency Medicine

## 2022-01-30 DIAGNOSIS — S51811A Laceration without foreign body of right forearm, initial encounter: Secondary | ICD-10-CM | POA: Insufficient documentation

## 2022-01-30 DIAGNOSIS — I10 Essential (primary) hypertension: Secondary | ICD-10-CM | POA: Insufficient documentation

## 2022-01-30 DIAGNOSIS — S0003XA Contusion of scalp, initial encounter: Secondary | ICD-10-CM | POA: Diagnosis not present

## 2022-01-30 DIAGNOSIS — S51812A Laceration without foreign body of left forearm, initial encounter: Secondary | ICD-10-CM | POA: Insufficient documentation

## 2022-01-30 DIAGNOSIS — S1091XA Abrasion of unspecified part of neck, initial encounter: Secondary | ICD-10-CM | POA: Diagnosis not present

## 2022-01-30 DIAGNOSIS — T148XXA Other injury of unspecified body region, initial encounter: Secondary | ICD-10-CM
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
## 2022-01-30 NOTE — Discharge Instructions (Addendum)
Imaging today was all normal.  Recommend Tylenol or motrin for pain, can alternate every 4-6 hours for best pain control. ?Can keep wounds cleansed and dressed at home.  Neosporin if desired. ?Follow-up with your primary care doctor. ?Return here for new concerns. ?
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
## 2022-01-30 NOTE — ED Notes (Signed)
Pt was able to call neighbor who is willing to provide transportation home. Charge RN updated vis secure chat.  ?

## 2022-01-30 NOTE — ED Notes (Signed)
Wound care provided. Pt ambulatory to and from the bathroom back to room. Gait stable  ?

## 2022-01-30 NOTE — ED Notes (Signed)
Patient transported to CT/XR ?

## 2022-04-13 ENCOUNTER — Other Ambulatory Visit: Payer: Self-pay

## 2022-04-13 ENCOUNTER — Emergency Department (HOSPITAL_COMMUNITY): Payer: Medicare Other

## 2022-04-13 ENCOUNTER — Encounter (HOSPITAL_COMMUNITY): Payer: Self-pay | Admitting: Emergency Medicine

## 2022-04-13 ENCOUNTER — Observation Stay (HOSPITAL_COMMUNITY)
Admission: EM | Admit: 2022-04-13 | Discharge: 2022-04-14 | Disposition: A | Discharge status: Home / Self Care | Payer: Medicare Other | Attending: Internal Medicine | Admitting: Internal Medicine

## 2022-04-13 DIAGNOSIS — E860 Dehydration: Secondary | ICD-10-CM | POA: Insufficient documentation

## 2022-04-13 DIAGNOSIS — N1832 Chronic kidney disease, stage 3b: Secondary | ICD-10-CM | POA: Diagnosis not present

## 2022-04-13 DIAGNOSIS — K9402 Colostomy infection: Secondary | ICD-10-CM | POA: Diagnosis present

## 2022-04-13 DIAGNOSIS — L03311 Cellulitis of abdominal wall: Secondary | ICD-10-CM | POA: Diagnosis not present

## 2022-04-13 DIAGNOSIS — E43 Unspecified severe protein-calorie malnutrition: Secondary | ICD-10-CM | POA: Diagnosis not present

## 2022-04-13 DIAGNOSIS — K50012 Crohn's disease of small intestine with intestinal obstruction: Principal | ICD-10-CM | POA: Diagnosis present

## 2022-04-13 DIAGNOSIS — E871 Hypo-osmolality and hyponatremia: Secondary | ICD-10-CM | POA: Insufficient documentation

## 2022-04-13 DIAGNOSIS — Z91199 Patient's noncompliance with other medical treatment and regimen due to unspecified reason: Secondary | ICD-10-CM

## 2022-04-13 DIAGNOSIS — Z87891 Personal history of nicotine dependence: Secondary | ICD-10-CM | POA: Insufficient documentation

## 2022-04-13 DIAGNOSIS — Z85038 Personal history of other malignant neoplasm of large intestine: Secondary | ICD-10-CM | POA: Diagnosis not present

## 2022-04-13 DIAGNOSIS — L24B3 Irritant contact dermatitis related to fecal or urinary stoma or fistula: Secondary | ICD-10-CM

## 2022-04-13 DIAGNOSIS — F419 Anxiety disorder, unspecified: Secondary | ICD-10-CM

## 2022-04-13 DIAGNOSIS — Z79899 Other long term (current) drug therapy: Secondary | ICD-10-CM | POA: Insufficient documentation

## 2022-04-13 DIAGNOSIS — R198 Other specified symptoms and signs involving the digestive system and abdomen: Secondary | ICD-10-CM

## 2022-04-13 DIAGNOSIS — L0390 Cellulitis, unspecified: Secondary | ICD-10-CM

## 2022-04-13 DIAGNOSIS — K50013 Crohn's disease of small intestine with fistula: Secondary | ICD-10-CM | POA: Diagnosis present

## 2022-04-13 DIAGNOSIS — E875 Hyperkalemia: Secondary | ICD-10-CM | POA: Diagnosis not present

## 2022-04-13 DIAGNOSIS — K5000 Crohn's disease of small intestine without complications: Secondary | ICD-10-CM | POA: Diagnosis present

## 2022-04-13 DIAGNOSIS — I129 Hypertensive chronic kidney disease with stage 1 through stage 4 chronic kidney disease, or unspecified chronic kidney disease: Secondary | ICD-10-CM | POA: Diagnosis not present

## 2022-04-13 DIAGNOSIS — Z7189 Other specified counseling: Secondary | ICD-10-CM

## 2022-04-13 DIAGNOSIS — G9341 Metabolic encephalopathy: Secondary | ICD-10-CM | POA: Diagnosis not present

## 2022-04-13 DIAGNOSIS — N179 Acute kidney failure, unspecified: Principal | ICD-10-CM | POA: Diagnosis present

## 2022-04-13 DIAGNOSIS — Z932 Ileostomy status: Secondary | ICD-10-CM | POA: Diagnosis not present

## 2022-04-13 DIAGNOSIS — N183 Chronic kidney disease, stage 3 unspecified: Secondary | ICD-10-CM

## 2022-04-13 HISTORY — DX: Chronic kidney disease, stage 3 unspecified: N18.30

## 2022-04-13 HISTORY — DX: Patient's noncompliance with other medical treatment and regimen due to unspecified reason: Z91.199

## 2022-04-13 LAB — URINALYSIS, ROUTINE W REFLEX MICROSCOPIC
Bilirubin Urine: NEGATIVE
Glucose, UA: NEGATIVE mg/dL
Hgb urine dipstick: NEGATIVE
Ketones, ur: NEGATIVE mg/dL
Nitrite: NEGATIVE
Protein, ur: 30 mg/dL — AB
Specific Gravity, Urine: 1.026 (ref 1.005–1.030)
pH: 5 (ref 5.0–8.0)

## 2022-04-13 LAB — COMPREHENSIVE METABOLIC PANEL
ALT: 16 U/L (ref 0–44)
AST: 19 U/L (ref 15–41)
Albumin: 4.3 g/dL (ref 3.5–5.0)
Alkaline Phosphatase: 76 U/L (ref 38–126)
Anion gap: 9 (ref 5–15)
BUN: 43 mg/dL — ABNORMAL HIGH (ref 8–23)
CO2: 20 mmol/L — ABNORMAL LOW (ref 22–32)
Calcium: 9.6 mg/dL (ref 8.9–10.3)
Chloride: 104 mmol/L (ref 98–111)
Creatinine, Ser: 2.56 mg/dL — ABNORMAL HIGH (ref 0.44–1.00)
GFR, Estimated: 21 mL/min — ABNORMAL LOW (ref >60)
Glucose, Bld: 97 mg/dL (ref 70–99)
Potassium: 5.3 mmol/L — ABNORMAL HIGH (ref 3.5–5.1)
Sodium: 133 mmol/L — ABNORMAL LOW (ref 135–145)
Total Bilirubin: 0.4 mg/dL (ref 0.3–1.2)
Total Protein: 8.5 g/dL — ABNORMAL HIGH (ref 6.5–8.1)

## 2022-04-13 LAB — CBC
HCT: 45.9 % (ref 36.0–46.0)
Hemoglobin: 15.1 g/dL — ABNORMAL HIGH (ref 12.0–15.0)
MCH: 29.3 pg (ref 26.0–34.0)
MCHC: 32.9 g/dL (ref 30.0–36.0)
MCV: 89.1 fL (ref 80.0–100.0)
Platelets: 385 10*3/uL (ref 150–400)
RBC: 5.15 MIL/uL — ABNORMAL HIGH (ref 3.87–5.11)
RDW: 13.4 % (ref 11.5–15.5)
WBC: 10.5 10*3/uL (ref 4.0–10.5)
nRBC: 0 % (ref 0.0–0.2)

## 2022-04-13 LAB — LACTIC ACID, PLASMA
Lactic Acid, Venous: 1.3 mmol/L (ref 0.5–1.9)
Lactic Acid, Venous: 1 mmol/L (ref 0.5–1.9)

## 2022-04-13 LAB — MAGNESIUM: Magnesium: 2.4 mg/dL (ref 1.7–2.4)

## 2022-04-13 MED ORDER — OXYCODONE HCL 5 MG PO TABS: 5.0000 mg | ORAL_TABLET | ORAL | Status: DC | PRN

## 2022-04-13 MED ORDER — ONDANSETRON HCL 4 MG/2ML IJ SOLN
4.0000 mg | Freq: Once | INTRAMUSCULAR | Status: AC
  Administered 2022-04-13: 4 mg via INTRAVENOUS
  Filled 2022-04-13: qty 2

## 2022-04-13 MED ORDER — SODIUM CHLORIDE 0.9 % IV SOLN
2.0000 g | Freq: Once | INTRAVENOUS | Status: AC
  Administered 2022-04-13: 2 g via INTRAVENOUS
  Filled 2022-04-13: qty 20

## 2022-04-13 MED ORDER — SODIUM CHLORIDE 0.9 % IV BOLUS
1000.0000 mL | Freq: Once | INTRAVENOUS | Status: AC
  Administered 2022-04-13: 1000 mL via INTRAVENOUS

## 2022-04-13 MED ORDER — OXYCODONE HCL 5 MG PO TABS: 5.0000 mg | ORAL_TABLET | Freq: Four times a day (QID) | ORAL | Status: DC | PRN

## 2022-04-13 MED ORDER — FERROUS SULFATE 325 (65 FE) MG PO TABS
325.0000 mg | ORAL_TABLET | Freq: Two times a day (BID) | ORAL | Status: DC
  Administered 2022-04-13 – 2022-04-14 (×2): 325 mg via ORAL
  Filled 2022-04-13 (×2): qty 1

## 2022-04-13 MED ORDER — ACETAMINOPHEN 325 MG PO TABS
650.0000 mg | ORAL_TABLET | Freq: Four times a day (QID) | ORAL | Status: AC | PRN
  Administered 2022-04-13: 650 mg via ORAL
  Filled 2022-04-13: qty 2

## 2022-04-13 MED ORDER — MORPHINE SULFATE (PF) 4 MG/ML IV SOLN
4.0000 mg | INTRAVENOUS | Status: DC | PRN
  Administered 2022-04-13: 4 mg via INTRAVENOUS
  Filled 2022-04-13: qty 1

## 2022-04-13 MED ORDER — LOPERAMIDE HCL 2 MG PO CAPS
4.0000 mg | ORAL_CAPSULE | Freq: Three times a day (TID) | ORAL | Status: DC
Start: 2022-04-13 — End: 2022-04-13
  Administered 2022-04-13: 4 mg via ORAL
  Filled 2022-04-13: qty 2

## 2022-04-13 MED ORDER — MORPHINE SULFATE (PF) 4 MG/ML IV SOLN
4.0000 mg | Freq: Once | INTRAVENOUS | Status: AC
Start: 2022-04-13 — End: 2022-04-13
  Administered 2022-04-13: 4 mg via INTRAVENOUS
  Filled 2022-04-13: qty 1

## 2022-04-13 MED ORDER — LACTATED RINGERS IV BOLUS
2000.0000 mL | Freq: Once | INTRAVENOUS | Status: AC
  Administered 2022-04-13: 2000 mL via INTRAVENOUS

## 2022-04-13 MED ORDER — CALCIUM POLYCARBOPHIL 625 MG PO TABS
625.0000 mg | ORAL_TABLET | Freq: Two times a day (BID) | ORAL | Status: DC
  Administered 2022-04-13 – 2022-04-14 (×2): 625 mg via ORAL
  Filled 2022-04-13 (×4): qty 1

## 2022-04-13 MED ORDER — LOPERAMIDE HCL 2 MG PO CAPS
4.0000 mg | ORAL_CAPSULE | Freq: Three times a day (TID) | ORAL | Status: DC
  Administered 2022-04-13 – 2022-04-14 (×3): 4 mg via ORAL
  Filled 2022-04-13 (×4): qty 2

## 2022-04-13 MED ORDER — HYDROMORPHONE HCL 1 MG/ML IJ SOLN: 0.5000 mg | INTRAMUSCULAR | Status: DC | PRN

## 2022-04-13 NOTE — Progress Notes (Signed)
Received to room 1508 via w/c. Monitor applied. Oriented to room. Callight within reach,enc to call for assist.

## 2022-04-13 NOTE — H&P (Signed)
History and Physical    Patient: Cynthia [name] FVC:[id number] DOB: [birth year] DOA: 04/13/2022 DOS: the patient was seen and examined on 04/13/2022 PCP: Denita Lung, MD  Patient coming from: Home  Chief Complaint:  Chief Complaint  Patient presents with   Ostomy Issue    HPI: Cynthia [name] is a 61 y.o. female with medical history significant of anxiety, GERD, MVP, HTN, IBS, RLS, partial bowel resection w/ colostomy, Crohns. Presenting with N/V, abdominal pain and confusion. She has made multiple visits for the same since June 2022. She chronically has a high output ostomy. In the past she has elected to follow with [facility] d/t insurance issues; however, she has been inconsistent on follow up. Her last visit documented with [facility] is 01/08/22. At that time it was recommended that she continue taking imodium and lomotil daily. It was recommended that she follow up with GI. She denies following up with GI in this time. She reports that she had N/V and abdominal pain starting two days ago. She is unaware of what therapies she tried to improve her situation. She does confirmed that is always has large amount of output from her ostomy.  Right now she is confused.  She has no family at bedside to give a history at this time.   Review of Systems: unable to review all systems due to the inability of the patient to answer questions d/t her confusion. Past Medical History:  Diagnosis Date   Anemia 2012   Anxiety    Bronchitis    Crohn's ileitis ([place])    Depression    GERD (gastroesophageal reflux disease)    Headache(784.0)    MIGRAINE   Heart murmur    Hiatal hernia    Hypertension    IBS (irritable bowel syndrome)    Incontinence    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HCC)    MVP (mitral valve prolapse)    RLS (restless legs syndrome)    Tubular adenoma of colon 08/2019   Past Surgical History:  Procedure Laterality Date   ABDOMINAL HYSTERECTOMY  06/17/2011   Procedure:  HYSTERECTOMY ABDOMINAL;  Surgeon: Arloa Koh;  Location: [facility];  Service: Gynecology;  Laterality: N/A;  Converted to Abdominal Hysterectomy with lysis of adhesions    ANTERIOR AND POSTERIOR REPAIR  02/25/2012   Procedure: ANTERIOR (CYSTOCELE) AND POSTERIOR REPAIR (RECTOCELE);  Surgeon: Daria Pastures, MD;  Location: [facility];  Service: Gynecology;  Laterality: N/A;  ANterior cystocele repair ONLY   BIOPSY  08/29/2019   Procedure: BIOPSY;  Surgeon: Lavena Bullion, DO;  Location: [facility];  Service: Gastroenterology;;   BLADDER SUSPENSION  02/25/2012   Procedure: TRANSVAGINAL TAPE (TVT) PROCEDURE;  Surgeon: Daria Pastures, MD;  Location: [facility];  Service: Gynecology;  Laterality: N/A;   COLONOSCOPY WITH PROPOFOL N/A 08/29/2019   Procedure: COLONOSCOPY WITH PROPOFOL;  Surgeon: Lavena Bullion, DO;  Location: [facility];  Service: Gastroenterology;  Laterality: N/A;   CYSTOSCOPY  02/25/2012   Procedure: CYSTOSCOPY;  Surgeon: Daria Pastures, MD;  Location: [facility];  Service: Gynecology;  Laterality: N/A;   ILEOSTOMY     KNEE ARTHROSCOPY  2010   LAPAROSCOPIC ASSISTED VAGINAL HYSTERECTOMY  06/17/2011   Procedure: LAPAROSCOPIC ASSISTED VAGINAL HYSTERECTOMY;  Surgeon: Arloa Koh;  Location: [facility];  Service: Gynecology;  Laterality: N/A;  Attempted    POLYPECTOMY  08/29/2019   Procedure: POLYPECTOMY;  Surgeon: Lavena Bullion, DO;  Location: [facility];  Service: Gastroenterology;;   SALPINGOOPHORECTOMY  06/17/2011   Procedure: SALPINGO OOPHERECTOMY;  Surgeon: Arloa Koh;  Location: [facility];  Service: Gynecology;  Laterality: Bilateral;   TONSILLECTOMY     Social History:  reports that she has quit smoking. Her smoking use included cigarettes. She has never used smokeless tobacco. She reports that she does not currently use alcohol. She reports that she does not currently use drugs after having used the following drugs: Cocaine and Marijuana.  Allergies  Allergen  Reactions   Iodine Other (See Comments)    The patient said she had a reaction from iodine in some eye drops- caused eyes to turn red;  pt has no reactions to CT contrast    Family History  Problem Relation Age of Onset   Diabetes Mother    Dementia Mother    Clotting disorder Father    Macular degeneration Father    Crohn's disease Sister    Colon cancer Neg Hx    Esophageal cancer Neg Hx    Pancreatic cancer Neg Hx    Stomach cancer Neg Hx    Liver disease Neg Hx     Prior to Admission medications   Medication Sig Start Date End Date Taking? Authorizing Provider  loperamide (IMODIUM) 2 MG capsule Take 1 capsule (2 mg total) by mouth 3 (three) times daily as needed for diarrhea or loose stools. 12/19/21  Yes Nita Sells, MD  octreotide (SANDOSTATIN LAR) 30 MG injection 30 mg in the morning, at noon, and at bedtime.   Yes [provider]  cholestyramine light (PREVALITE) 4 g packet Take 1 packet (4 g total) by mouth 3 (three) times daily.  **Mix as directed prior to taking** Patient not taking: Reported on 04/13/2022 12/19/21 02/17/22  Nita Sells, MD  ferrous sulfate 325 (65 FE) MG tablet Take 1 tablet (325 mg total) by mouth 2 (two) times daily with a meal. Patient not taking: Reported on 01/03/2022 12/19/21 01/18/22  Nita Sells, MD  furosemide (LASIX) 40 MG tablet Take 1 tablet (40 mg total) by mouth daily. If your ostomy output is high, hold this medicine. Patient not taking: Reported on 04/13/2022 12/28/21 01/27/22  Elodia Florence., MD  SYRINGE-NEEDLE, DISP, 3 ML 25G X 5/8" 3 ML MISC Use as directed for octreotide injection 12/27/21   Elodia Florence., MD    Physical Exam: Vitals:   04/13/22 1300 04/13/22 1330 04/13/22 1345 04/13/22 1400  BP: 92/74 103/79 96/74 100/67  Pulse: 78 80 77 74  Resp: 16 (!) 9 11 11   Temp:      TempSrc:      SpO2: 100% 100% 100% 100%   General: 61 y.o. ill appearing female resting in bed in NAD Eyes:  PERRL, normal sclera ENMT: Nares patent w/o discharge, orophaynx clear, dentition normal, ears w/o discharge/lesions/ulcers Neck: Supple, trachea midline Cardiovascular: tachy, +S1, S2, no m/g/r, equal pulses throughout Respiratory: CTABL, no w/r/r, normal WOB GI: BS+, ND, globally tender and erythematous, no masses noted, no organomegaly noted MSK: No e/c/c Neuro: A&O x 3, no focal deficits Psyc: She is alert though confused, calm/cooperative  Data Reviewed:  Na+  133 K+  5.3 CO2  20 BUN  43 SCR  2.56 Lactic acid  1.3 WBC  10.5 Hgb  15.1 Plt  385  CT ab/pelvis 1. No acute findings within the abdomen or pelvis. No evidence of bowel obstruction or inflammation. No change in the appearance of the right or left mid abdomen ostomies.  Assessment and Plan: High output  ostomy     - admit to inpt, tele     - continue fluids     - WOCN consult     - spoke with general surgery: rec'd that she follow up with her Colorectal and GI group; but she has not established with GI; she says she wants to remain in [place] for her cause and that is now possible with her change in insurance; surgery rec'd Fe 325 BID, fibercon BID, imodium 21m TID; appreciate assistance  Abdominal wall cellulitis secondary to above     - continue rocephin for now     - WOCN  Acute metabolic encephalopathy     - secondary to dehydration  Dehydration AKI     - no obstruction seen on imaging     - watch nephrotoxins     - fluids  Hyperkalemia     - mild; fluids, rpt renal function in a couple of hours, if still elevated, add lokelma  Hyponatremia     - mild, fluids, follow  Severe protein-calorie malnutrition     - dietitian consult  Advance Care Planning:   Code Status: FULL  Consults: Sidebarred general surgery  Family Communication: None at bedside  Severity of Illness: The appropriate patient status for this patient is INPATIENT. Inpatient status is judged to be reasonable and necessary in  order to provide the required intensity of service to ensure the patient's safety. The patient's presenting symptoms, physical exam findings, and initial radiographic and laboratory data in the context of their chronic comorbidities is felt to place them at high risk for further clinical deterioration. Furthermore, it is not anticipated that the patient will be medically stable for discharge from the hospital within 2 midnights of admission.   * I certify that at the point of admission it is my clinical judgment that the patient will require inpatient hospital care spanning beyond 2 midnights from the point of admission due to high intensity of service, high risk for further deterioration and high frequency of surveillance required.*  Author: TJonnie Finner DO 04/13/2022 2:34 PM  For on call review [email or web address]

## 2022-04-13 NOTE — ED Triage Notes (Signed)
BIBA  Per EMS: Pt coming from home w/ c/o infected colostomy site. Colostomy bag got full and fell off according to pt. Site it red and tender. Pt reports pain to site.  VSS

## 2022-04-13 NOTE — Consult Note (Signed)
[place] Nurse ostomy consult note Stoma type/location: Patient is in the ED and current pouching system is leaking. Irritant contact dermatitis is noted in the peristomal area by Nursing staff.  Patient is well known to our department from numerous previous admissions. She had ileostomy surgery in 2022 and has been able to change her pouch independently at home without assistance. On occasion, her output becomes high output and she is not able to recall/does not perform skin care regimen as instructed.  Note: Tincture of Benzoin or creams are not to be used for the care of peristomal skin as they are either too aggressive and contribute to medical adhesive related skin injury and skin health disruption or impede pouch adhesion. Do not use these products.  ICD-10 CM Codes for Irritant Dermatitis  L24B3 - Related to fecal or urinary stoma or fistula  Stomal assessment/size: When last measured, stoma measured 1 and 1/4 inches and is round, raised.  The mucous fistula on the left side of the abdomen is flush with the skin and does not require pouching. This may be covered with a dry gauze 4x4 and secured with tape, if desired.  Peristomal assessment: Reported by Nursing to be erythematous and broken  Treatment for irritant contact dermatitis due to fecal stoma: Remove pouching system, discard. Wash skin with warm tap water, pat dry. Sprinkle ostomy powder onto skin, brush away excess.  BLOT with liquid barrier film (Cavillon skin-prep-type sealant).  Repeat powder and blotting of skin prep x2. Pouch.  Output: thin, light brown effluent Ostomy pouching: 1pc./2pc.  Education provided: None Enrolled patient in [employer] program: Yes, previously. Patient has also been established with [facility], the only [facility] [employer] provider.  In the past, the patient has found herself out of supplies, attempts to reuse supplies and has presented at local DME provider locations in an attempt to obtain  supplies. Numerous teaching attempts have been made with this patient regarding her supplies and pouching.  Order Supplies: 5 skin barrier rings, [name] # [id number]; 5 skin barriers [name] #644, 5 pouches [name] #234, and also 5 high output pouch [name] [phone]; 2 ostomy powders, [name] # 6; 2 ostomy  belts, [name] # [id number]  [facility] nursing team will check on patient this week if admitted, and will remain available to this patient, the nursing and medical teams while in house.    Thank you for inviting [place] to participate in this patient's Plan of Care.  Maudie Flakes, MSN, RN, CNS, [place], Serita Grammes, [employer], [employer] phone:  [phone]

## 2022-04-13 NOTE — Progress Notes (Addendum)
Cynthia [name] NIDPOEUM  Mar 16, 1961 [id number]  CARE TEAM:  PCP: Cynthia Lung, MD  Outpatient Care Team: Patient Care Team: Cynthia Lung, MD as PCP - General (Family Medicine) Cynthia Agent, MD as Referring Physician (Colon and Rectal Surgery) Cynthia Artist, MD as Consulting Physician (Gastroenterology)  Inpatient Treatment Team: Treatment Team: Attending Provider: Jonnie Finner, DO; Social Worker: Cynthia [name]; Registered Nurse: Cynthia Shutter, RN; Technician: Cynthia [name], NT; Technician: Cynthia [name], NT; Rounding Team: Cynthia Knife, MD; Consulting Physician: Cynthia Pace, Md, MD  This patient is a 61 y.o.female who presents today for surgical evaluation at the request of Cynthia [name].     Assessment  Cynthia [name]  61 y.o. 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  Plan:  Patient with Crohn's disease.  Recurrent flares with obstruction & fistulae.  Required urgent ileoicolectomy/end ileostomy  and sigmoid colectomy/descending colon colostomy/mucous fistula April 2022 by Cynthia [name] with [facility] colorectal surgery.  He has been following her as an outpatient and trying to help the patient be compliant on antidiarrheal regimen and Crohn's medication for which she is not.  Patient comes to [facility] & internal medicine admissions with dehydration often.  Again has kidney failure and high output ileostomy.  Sounds like she is not  taking medications.  Cynthia. Marylyn [name] asked for advice.  Recommend  triumverate of: -iron twice a day to constipate -FiberCon twice a day to thicken SB effluent "stools" -Imodium 4 times daily to slow down and thicken stools.    WOCN eval for ostomy bag leaking/dermatitis & CSW consultssupply issues - Cynthia [name] already seeing.  Patient has had numerous urgent consultations requested to [facility] group as well as all the gastroenterology groups in town.  Has been on numerous different regimen recommendations including the above and including cholestyramine and octreotide and Lomotil.  Do not know if that is what Cynthia [name] wants.  Looking in [place], the last note implies just trying to keep it simple & stay compliant on Imodium at least to start.  This patient needs one ring leader.  Gastroenterology wondered if we can help with care but patient wished to stay with Cynthia [name] with Cynthia [name], so it looks like Cynthia [name] should be it.  Last seen by him in March 2023.  Overdue for 39-monthfollow-up in May.    See if [facility] can accept the patient to help manage this recurrent frustrating problem.    I reviewed nursing notes, ED provider notes, hospitalist notes, last 24 h vitals and pain scores, last 48 h intake and output, last 24 h labs and trends, and last 24 h imaging results. I have reviewed this patient's available data, including medical history, events of note, test results, etc as part of my evaluation.  A significant portion of that time was spent in counseling.  Care during the described time interval was provided by me.  This care required moderate  level of medical decision making.  04/13/2022  Cynthia Hector, MD, FACS, MASCRS Esophageal, Gastrointestinal & Colorectal Surgery Robotic and Minimally Invasive Surgery  [facility], [place]. [address], [place],  [address] [phone] Fax [phone]  Main  CONTACT INFORMATION:  Weekday (9AM-5PM): Call [facility] main office at [phone]  Weeknight (5PM-9AM) or Weekend/Holiday: Check [email or web address] (password " [email or web address]") for General Surgery [facility] coverage  (Please, do not use SecureChat as it is not reliable communication to operating surgeons for immediate patient care)      04/13/2022      Past Medical History:  Diagnosis Date   AKI (acute kidney injury) ([place]) 04/05/2021   Anemia 2012   Anxiety    Bronchitis    CKD (chronic kidney disease) stage 3, GFR 30-59 ml/min ([place]) 04/13/2022   Crohn's ileitis & colitis s/ ileocolectomy/ileostomy and sigmoid colectomy/mucus fistula 10/05/2019   Crohn's ileitis ([place])    Depression    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   GERD (gastroesophageal reflux disease)    Headache(784.0)    MIGRAINE   Heart murmur    Hiatal hernia    History of noncompliance with medical treatment 04/13/2022   History of rectal polyp 2020 01/12/2021   History of serrated colonic polyp 01/13/2011   Hypertension    Incontinence    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[place])    MVP (mitral valve prolapse)    RLS (restless legs syndrome) 09/25/2014   Sepsis due to cellulitis ([place]) 04/05/2021    Past Surgical History:  Procedure Laterality Date   ABDOMINAL HYSTERECTOMY  06/17/2011   Procedure: HYSTERECTOMY ABDOMINAL;  Surgeon: Arloa Koh;  Location: [facility];  Service: Gynecology;  Laterality: N/A;  Converted to Abdominal Hysterectomy with lysis of adhesions    ANTERIOR AND POSTERIOR REPAIR  02/25/2012   Procedure: ANTERIOR (CYSTOCELE) AND POSTERIOR REPAIR (RECTOCELE);  Surgeon: Daria Pastures, MD;  Location: [facility];  Service: Gynecology;  Laterality: N/A;  ANterior cystocele repair ONLY   BIOPSY  08/29/2019   Procedure: BIOPSY;  Surgeon: Lavena Bullion, DO;  Location: [facility];  Service: Gastroenterology;;   BLADDER SUSPENSION  02/25/2012   Procedure: TRANSVAGINAL TAPE (TVT)  PROCEDURE;  Surgeon: Daria Pastures, MD;  Location: [facility];  Service: Gynecology;  Laterality: N/A;   COLECTOMY WITH COLOSTOMY CREATION/HARTMANN PROCEDURE  02/19/2021   Sigmoid colectomy w descnding colon MF/ostomy   COLONOSCOPY WITH PROPOFOL N/A 08/29/2019   Procedure: COLONOSCOPY WITH PROPOFOL;  Surgeon: Lavena Bullion, DO;  Location: [facility];  Service: Gastroenterology;  Laterality: N/A;   CYSTOSCOPY  02/25/2012   Procedure: CYSTOSCOPY;  Surgeon: Daria Pastures, MD;  Location: [facility];  Service: Gynecology;  Laterality: N/A;   ILEOCECETOMY  02/19/2021   Cynthia Hilliard [name] @ [facility]   ILEOSTOMY  02/19/2021   [facility] - Cynthia Hilliard [name]   KNEE ARTHROSCOPY  2010   LAPAROSCOPIC ASSISTED VAGINAL HYSTERECTOMY  06/17/2011   Procedure: LAPAROSCOPIC ASSISTED VAGINAL HYSTERECTOMY;  Surgeon: Arloa Koh;  Location: [facility];  Service: Gynecology;  Laterality: N/A;  Attempted    MUCUS FISTULA - RECTOSIGMOID  02/19/2021   POLYPECTOMY  08/29/2019   Procedure: POLYPECTOMY;  Surgeon: Lavena Bullion, DO;  Location: [facility];  Service: Gastroenterology;;   SALPINGOOPHORECTOMY  06/17/2011   Procedure: SALPINGO OOPHERECTOMY;  Surgeon: Arloa Koh;  Location: [facility];  Service: Gynecology;  Laterality: Bilateral;   TONSILLECTOMY      Social History  Socioeconomic History   Marital status: Widowed    Spouse name: Not on file   Number of children: 1   Years of education: Not on file   Highest education level: Not on file  Occupational History   Occupation: Designer, television/film set: [employer]  Tobacco Use   Smoking status: Former    Packs/day: 0.00    Types: Cigarettes   Smokeless tobacco: Never   Tobacco comments:    none in 2 weeks as of 12/04/20  Vaping Use   Vaping Use: Former  Substance and Sexual Activity   Alcohol use: Not Currently   Drug use: Not Currently    Types: Cocaine, Marijuana    Comment: clean x 8 weeks   Sexual activity: Not Currently  Other Topics Concern   Not  on file  Social History Narrative   Lives with boyfriend who she states is an alcoholic   Social Determinants of Radio broadcast assistant Strain: Not on file  Food Insecurity: Not on file  Transportation Needs: Not on file  Physical Activity: Not on file  Stress: Not on file  Social Connections: Not on file  Intimate Partner Violence: Not on file    Family History  Problem Relation Age of Onset   Diabetes Mother    Dementia Mother    Clotting disorder Father    Macular degeneration Father    Crohn's disease Sister    Colon cancer Neg Hx    Esophageal cancer Neg Hx    Pancreatic cancer Neg Hx    Stomach cancer Neg Hx    Liver disease Neg Hx     Current Facility-Administered Medications  Medication Dose Route Frequency Provider Last Rate Last Admin   ferrous sulfate tablet 325 mg  325 mg Oral BID WC Michael Boston, MD       lactated ringers bolus 2,000 mL  2,000 mL Intravenous Once Michael Boston, MD       loperamide (IMODIUM) capsule 4 mg  4 mg Oral TID WC & HS Michael Boston, MD       polycarbophil (FIBERCON) tablet 625 mg  625 mg Oral BID Michael Boston, MD       Current Outpatient Medications  Medication Sig Dispense Refill   loperamide (IMODIUM) 2 MG capsule Take 1 capsule (2 mg total) by mouth 3 (three) times daily as needed for diarrhea or loose stools. 30 capsule 3   octreotide (SANDOSTATIN LAR) 30 MG injection 30 mg in the morning, at noon, and at bedtime.     cholestyramine light (PREVALITE) 4 g packet Take 1 packet (4 g total) by mouth 3 (three) times daily.  **Mix as directed prior to taking** (Patient not taking: Reported on 04/13/2022) 90 packet 1   ferrous sulfate 325 (65 FE) MG tablet Take 1 tablet (325 mg total) by mouth 2 (two) times daily with a meal. (Patient not taking: Reported on 01/03/2022) 60 tablet 1   SYRINGE-NEEDLE, DISP, 3 ML 25G X 5/8" 3 ML MISC Use as directed for octreotide injection 90 each 0     Allergies  Allergen Reactions   Nsaids Other  (See Comments)    Crohn's disease & CKD   Ace Inhibitors Cough   Iodine Other (See Comments)    The patient said she had a reaction from iodine in some eye drops- caused eyes to turn red;  pt has no reactions to CT contrast       BP 111/84   Pulse Marland Kitchen)  105   Temp (!) 97.5 F (36.4 C) (Oral)   Resp 10   LMP 06/10/2011 (Exact Date)   SpO2 100%      Results:   Labs: Results for orders placed or performed during the hospital encounter of 04/13/22 (from the past 48 hour(s))  CBC     Status: Abnormal   Collection Time: 04/13/22  8:46 AM  Result Value Ref Range   WBC 10.5 4.0 - 10.5 K/uL   RBC 5.15 (H) 3.87 - 5.11 MIL/uL   Hemoglobin 15.1 (H) 12.0 - 15.0 g/dL   HCT 45.9 36.0 - 46.0 %   MCV 89.1 80.0 - 100.0 fL   MCH 29.3 26.0 - 34.0 pg   MCHC 32.9 30.0 - 36.0 g/dL   RDW 13.4 11.5 - 15.5 %   Platelets 385 150 - 400 K/uL   nRBC 0.0 0.0 - 0.2 %    Comment: Performed at [facility], [place] [address], [place], [place] [address]  Comprehensive metabolic panel     Status: Abnormal   Collection Time: 04/13/22  8:46 AM  Result Value Ref Range   Sodium 133 (L) 135 - 145 mmol/L   Potassium 5.3 (H) 3.5 - 5.1 mmol/L   Chloride 104 98 - 111 mmol/L   CO2 20 (L) 22 - 32 mmol/L   Glucose, Bld 97 70 - 99 mg/dL    Comment: Glucose reference range applies only to samples taken after fasting for at least 8 hours.   BUN 43 (H) 8 - 23 mg/dL   Creatinine, Ser 2.56 (H) 0.44 - 1.00 mg/dL   Calcium 9.6 8.9 - 10.3 mg/dL   Total Protein 8.5 (H) 6.5 - 8.1 g/dL   Albumin 4.3 3.5 - 5.0 g/dL   AST 19 15 - 41 U/L   ALT 16 0 - 44 U/L   Alkaline Phosphatase 76 38 - 126 U/L   Total Bilirubin 0.4 0.3 - 1.2 mg/dL   GFR, Estimated 21 (L) >60 mL/min    Comment: (NOTE) Calculated using the CKD-EPI Creatinine Equation (2021)    Anion gap 9 5 - 15    Comment: Performed at [facility], [place] [address], [place], [place] [address]  Lactic acid, plasma     Status: None    Collection Time: 04/13/22  8:46 AM  Result Value Ref Range   Lactic Acid, Venous 1.3 0.5 - 1.9 mmol/L    Comment: Performed at [facility], [place] [address] [place], [place] [address]  Magnesium     Status: None   Collection Time: 04/13/22  8:52 AM  Result Value Ref Range   Magnesium 2.4 1.7 - 2.4 mg/dL    Comment: Performed at [facility], [place] [address], [place], [place] [address]  Lactic acid, plasma     Status: None   Collection Time: 04/13/22 10:46 AM  Result Value Ref Range   Lactic Acid, Venous 1.0 0.5 - 1.9 mmol/L    Comment: Performed at [facility], [place] [address], [place], [place] [address]  Urinalysis, Routine w reflex microscopic     Status: Abnormal   Collection Time: 04/13/22 12:58 PM  Result Value Ref Range   Color, Urine YELLOW YELLOW   APPearance HAZY (A) CLEAR   Specific Gravity, Urine 1.026 1.005 - 1.030   pH 5.0 5.0 - 8.0   Glucose, UA NEGATIVE NEGATIVE mg/dL   Hgb urine dipstick NEGATIVE NEGATIVE   Bilirubin Urine NEGATIVE NEGATIVE  Ketones, ur NEGATIVE NEGATIVE mg/dL   Protein, ur 30 (A) NEGATIVE mg/dL   Nitrite NEGATIVE NEGATIVE   Leukocytes,Ua MODERATE (A) NEGATIVE   RBC / HPF 0-5 0 - 5 RBC/hpf   WBC, UA 11-20 0 - 5 WBC/hpf   Bacteria, UA RARE (A) NONE SEEN   Squamous Epithelial / LPF 0-5 0 - 5   Mucus PRESENT    Hyaline Casts, UA PRESENT     Comment: Performed at [facility], [place] [address] [place], [place] [address]    Imaging / Studies: CT ABDOMEN PELVIS WO CONTRAST  Result Date: 04/13/2022 CLINICAL DATA:  Copious diarrhea from the colostomy site. EXAM: CT ABDOMEN AND PELVIS WITHOUT CONTRAST TECHNIQUE: Multidetector CT imaging of the abdomen and pelvis was performed following the standard protocol without IV contrast. RADIATION DOSE REDUCTION: This exam was performed according to the departmental dose-optimization program which includes automated exposure control,  adjustment of the mA and/or kV according to patient size and/or use of iterative reconstruction technique. COMPARISON:  12/12/2021. FINDINGS: Lower chest: No acute abnormality. Hepatobiliary: Normal liver. Small densities in the gallbladder consistent with stones. No wall thickening or inflammation. No bile duct dilation. Pancreas: Unremarkable. No pancreatic ductal dilatation or surrounding inflammatory changes. Spleen: Normal in size without focal abnormality. Adrenals/Urinary Tract: Adrenal glands are unremarkable. Kidneys are normal, without renal calculi, focal lesion, or hydronephrosis. Bladder is unremarkable. Stomach/Bowel: Normal stomach. Prominent, but not overtly dilated, loops of small bowel in the pelvis similar to the prior CT. Right mid abdomen ileostomy, stable in appearance from the prior study. Another ostomy lies in the left mid abdomen, also unchanged. Stable bowel anastomosis staples along the lower sigmoid colon and right colon. No colonic dilation or wall thickening. No inflammation. Vascular/Lymphatic: No significant vascular findings are present. No enlarged abdominal or pelvic lymph nodes. Reproductive: Status post hysterectomy. No adnexal masses. Other: No abdominal wall hernia or abnormality. No abdominopelvic ascites. Musculoskeletal: No fracture or acute finding.  No bone lesion. IMPRESSION: 1. No acute findings within the abdomen or pelvis. No evidence of bowel obstruction or inflammation. No change in the appearance of the right or left mid abdomen ostomies. Electronically Signed   By: Lajean Manes M.D.   On: 04/13/2022 11:11    Medications / Allergies: per chart  Antibiotics: Anti-infectives (From admission, onward)    Start     Dose/Rate Route Frequency Ordered Stop   04/13/22 1430  cefTRIAXone (ROCEPHIN) 2 g in sodium chloride 0.9 % 100 mL IVPB        2 g 200 mL/hr over 30 Minutes Intravenous  Once 04/13/22 1424 04/13/22 1513         Note: Portions of this report  may have been transcribed using voice recognition software. Every effort was made to ensure accuracy; however, inadvertent computerized transcription errors may be present.   Any transcriptional errors that result from this process are unintentional.    Cynthia Hector, MD, FACS, MASCRS Esophageal, Gastrointestinal & Colorectal Surgery Robotic and Minimally Invasive Surgery  [facility], [place]  [place]. [address] [place],  [address] [phone] Fax [phone] Main  CONTACT INFORMATION:  Weekday (9AM-5PM): Call [facility] main office at [phone]  Weeknight (5PM-9AM) or Weekend/Holiday: Check [email or web address] (password " [email or web address]") for General Surgery [facility] coverage  (Please, do not use SecureChat as it is not reliable communication to operating surgeons for immediate patient care)       04/13/2022  3:47  PM

## 2022-04-13 NOTE — ED Provider Notes (Signed)
[facility] DEPT Provider Note   CSN: [id number] Arrival date & time: 04/13/22  0753     History  Chief Complaint  Patient presents with   Ostomy Issue     Cynthia [name] is a 61 y.o. female.  Patient is a 61 year old female with past medical history of Crohn's disease and small bowel obstruction resulting in bowel resection, ileocolectomy, and sigmoidcolectomy resenting for cholectomy bag problem.  Patient states she has been having opiates amounts of watery yellow-green diarrhea from ileostomy bag for several days.  States bag is unable to stick to the skin at this time.  Admits to redness and swelling of skin.  She admits to decreased appetite.  Denies any abdominal pain, nausea, vomiting.  Denies fevers or chills.  The history is provided by the patient. No language interpreter was used.       Home Medications Prior to Admission medications   Medication Sig Start Date End Date Taking? Authorizing Provider  loperamide (IMODIUM) 2 MG capsule Take 1 capsule (2 mg total) by mouth 3 (three) times daily as needed for diarrhea or loose stools. 12/19/21  Yes Nita Sells, MD  octreotide (SANDOSTATIN LAR) 30 MG injection 30 mg in the morning, at noon, and at bedtime.   Yes [provider]  cholestyramine light (PREVALITE) 4 g packet Take 1 packet (4 g total) by mouth 3 (three) times daily.  **Mix as directed prior to taking** Patient not taking: Reported on 04/13/2022 12/19/21 02/17/22  Nita Sells, MD  ferrous sulfate 325 (65 FE) MG tablet Take 1 tablet (325 mg total) by mouth 2 (two) times daily with a meal. Patient not taking: Reported on 01/03/2022 12/19/21 01/18/22  Nita Sells, MD  furosemide (LASIX) 40 MG tablet Take 1 tablet (40 mg total) by mouth daily. If your ostomy output is high, hold this medicine. Patient not taking: Reported on 04/13/2022 12/28/21 01/27/22  Elodia Florence., MD  SYRINGE-NEEDLE, DISP, 3 ML 25G  X 5/8" 3 ML MISC Use as directed for octreotide injection 12/27/21   Elodia Florence., MD      Allergies    Iodine    Review of Systems   Review of Systems  Constitutional:  Negative for chills and fever.  HENT:  Negative for ear pain and sore throat.   Eyes:  Negative for pain and visual disturbance.  Respiratory:  Negative for cough and shortness of breath.   Cardiovascular:  Negative for chest pain and palpitations.  Gastrointestinal:  Positive for diarrhea. Negative for abdominal pain, nausea and vomiting.  Genitourinary:  Negative for dysuria and hematuria.  Musculoskeletal:  Negative for arthralgias and back pain.  Skin:  Negative for color change and rash.  Neurological:  Negative for seizures and syncope.  All other systems reviewed and are negative.   Physical Exam Updated Vital Signs BP 100/67   Pulse 74   Temp (!) 97.5 F (36.4 C) (Oral)   Resp 11   LMP 06/10/2011 (Exact Date)   SpO2 100%  Physical Exam Vitals and nursing note reviewed.  Constitutional:      General: She is not in acute distress.    Appearance: She is well-developed.  HENT:     Head: Normocephalic and atraumatic.  Eyes:     Conjunctiva/sclera: Conjunctivae normal.  Cardiovascular:     Rate and Rhythm: Normal rate and regular rhythm.     Heart sounds: No murmur heard. Pulmonary:     Effort: Pulmonary effort is  normal. No respiratory distress.     Breath sounds: Normal breath sounds.  Abdominal:     Palpations: Abdomen is soft.     Tenderness: There is no abdominal tenderness.     Comments: Colostomy site demonstrates pink bowel tissue, actively leaking yellow-green watery stool, surrounding skin encompassing the entire abdomen is erythematous and tender.  Musculoskeletal:        General: No swelling.     Cervical back: Neck supple.  Skin:    General: Skin is warm and dry.     Capillary Refill: Capillary refill takes less than 2 seconds.  Neurological:     Mental Status: She is  alert.  Psychiatric:        Mood and Affect: Mood normal.     ED Results / Procedures / Treatments   Labs (all labs ordered are listed, but only abnormal results are displayed) Labs Reviewed  CBC - Abnormal; Notable for the following components:      Result Value   RBC 5.15 (*)    Hemoglobin 15.1 (*)    All other components within normal limits  COMPREHENSIVE METABOLIC PANEL - Abnormal; Notable for the following components:   Sodium 133 (*)    Potassium 5.3 (*)    CO2 20 (*)    BUN 43 (*)    Creatinine, Ser 2.56 (*)    Total Protein 8.5 (*)    GFR, Estimated 21 (*)    All other components within normal limits  URINALYSIS, ROUTINE W REFLEX MICROSCOPIC - Abnormal; Notable for the following components:   APPearance HAZY (*)    Protein, ur 30 (*)    Leukocytes,Ua MODERATE (*)    Bacteria, UA RARE (*)    All other components within normal limits  LACTIC ACID, PLASMA  LACTIC ACID, PLASMA  MAGNESIUM    EKG None  Radiology CT ABDOMEN PELVIS WO CONTRAST  Result Date: 04/13/2022 CLINICAL DATA:  Copious diarrhea from the colostomy site. EXAM: CT ABDOMEN AND PELVIS WITHOUT CONTRAST TECHNIQUE: Multidetector CT imaging of the abdomen and pelvis was performed following the standard protocol without IV contrast. RADIATION DOSE REDUCTION: This exam was performed according to the departmental dose-optimization program which includes automated exposure control, adjustment of the mA and/or kV according to patient size and/or use of iterative reconstruction technique. COMPARISON:  12/12/2021. FINDINGS: Lower chest: No acute abnormality. Hepatobiliary: Normal liver. Small densities in the gallbladder consistent with stones. No wall thickening or inflammation. No bile duct dilation. Pancreas: Unremarkable. No pancreatic ductal dilatation or surrounding inflammatory changes. Spleen: Normal in size without focal abnormality. Adrenals/Urinary Tract: Adrenal glands are unremarkable. Kidneys are normal,  without renal calculi, focal lesion, or hydronephrosis. Bladder is unremarkable. Stomach/Bowel: Normal stomach. Prominent, but not overtly dilated, loops of small bowel in the pelvis similar to the prior CT. Right mid abdomen ileostomy, stable in appearance from the prior study. Another ostomy lies in the left mid abdomen, also unchanged. Stable bowel anastomosis staples along the lower sigmoid colon and right colon. No colonic dilation or wall thickening. No inflammation. Vascular/Lymphatic: No significant vascular findings are present. No enlarged abdominal or pelvic lymph nodes. Reproductive: Status post hysterectomy. No adnexal masses. Other: No abdominal wall hernia or abnormality. No abdominopelvic ascites. Musculoskeletal: No fracture or acute finding.  No bone lesion. IMPRESSION: 1. No acute findings within the abdomen or pelvis. No evidence of bowel obstruction or inflammation. No change in the appearance of the right or left mid abdomen ostomies. Electronically Signed   By: Shanon Brow  Ormond M.D.   On: 04/13/2022 11:11    Procedures Procedures    Medications Ordered in ED Medications  cefTRIAXone (ROCEPHIN) 2 g in sodium chloride 0.9 % 100 mL IVPB (has no administration in time range)  sodium chloride 0.9 % bolus 1,000 mL (0 mLs Intravenous Stopped 04/13/22 1315)  ondansetron (ZOFRAN) injection 4 mg (4 mg Intravenous Given 04/13/22 1020)  morphine (PF) 4 MG/ML injection 4 mg (4 mg Intravenous Given 04/13/22 1020)  sodium chloride 0.9 % bolus 1,000 mL (1,000 mLs Intravenous New Bag/Given 04/13/22 1308)    ED Course/ Medical Decision Making/ A&P Clinical Course as of 04/13/22 1437  Sun Apr 13, 2022  1431 Creatinine(!): 2.56 [AG]    Clinical Course User Index [AG] Lianne Cure, DO                           Medical Decision Making Amount and/or Complexity of Data Reviewed Labs: ordered. Decision-making details documented in ED Course. Radiology: ordered.  Risk Prescription drug  management. Decision regarding hospitalization.   2:63 PM 61 year old female with past medical history of Crohn's disease and small bowel obstruction resulting in bowel resection, ileocolectomy, and sigmoidcolectomy resenting for cholectomy bag problem.  Patient is alert and oriented x3, afebrile, stable vital signs.  Physical exam demonstrates   Colostomy site demonstrates pink bowel tissue, actively leaking yellow-green watery stool, surrounding skin encompassing the entire abdomen is erythematous and tender.  Concerns for cellulitis.  Rocephin given.  Nursing at bedside cleaning patient and attempting to reattach new ostomy bag.  Nursing reporting failure of attaching ostomy bag due to skin irritation.  Will consult wound care nurse.  Patient otherwise has stable liver profile and lipase.  CT abdomen demonstrates no acute process or etiologies to be resulting in diarrhea or loose stools.  Urinalysis demonstrates urinary tract infection.  Rocephin given for cellulitis will cover for this.  Patient has AKI with creatinine of 2.56 up from 1.39.  At this time recommend patient is admitted for loose bowel movements and leakage of yellow fluid from ostomy site resulting in soft tissue irritation of the abdomen and cellulitis, dehydration, and acute kidney injury.  Urinary tract infection present.  Signs or symptoms of sepsis at this time.  I spoke with admitting physician Dr. [name] who agrees to accept patient.         Final Clinical Impression(s) / ED Diagnoses Final diagnoses:  AKI (acute kidney injury) ([place])  Cellulitis, unspecified cellulitis site  Ostomy nurse consultation    Rx / DC Orders ED Discharge Orders     None         Lianne Cure, DO 60/10/93 1437

## 2022-04-13 NOTE — Discharge Instructions (Addendum)
######################################################################   CONTROLLING DIARRHEA  TAKE A FIBER SUPPLEMENT (FiberCon or Benefiner soluble fiber) twice a day - to thicken stools by absorbing excess fluid and retrain the intestines to act more normally.  Slowly increase the dose over a few weeks.  Too much fiber too soon can backfire and cause cramping & bloating.  TAKE AN IRON SUPPLEMENT twice a day to naturally constipate your bowels.  Usually ferrous sulfate 335m twice a day)  TAKE ANTI-DIARRHEAL MEDICINES: Loperamide (Imodium) can slow down diarrhea.  Start with two tablets (= 4332m first and then try one tablet every 6 hours.  Can go up to 2 pills four times day (8 pills of 32m10max) Avoid if you are having fevers or severe pain.  If you are not better or start feeling worse, stop all medicines and call your doctor for advice LoMotil (Diphenoxylate / Atropine) is another medicine that can constipate & slow down bowel moevements Pepto Bismol (bismuth) can gently thicken bowels as well  If diarrhea is worse,: drink plenty of liquids and try simpler foods for a few days to avoid stressing your intestines further. Avoid dairy products (especially milk & ice cream) for a short time.  The intestines often can lose the ability to digest lactose when stressed. Avoid foods that cause gassiness or bloating.  Typical foods include beans and other legumes, cabbage, broccoli, and dairy foods.  Every person has some sensitivity to other foods, so listen to our body and avoid those foods that trigger problems for you.Call your doctor if you are getting worse or not better.  Sometimes further testing (cultures, endoscopy, X-ray studies, bloodwork, etc) may be needed to help diagnose and treat the cause of the diarrhea. Take extra anti-diarrheal medicines (maximum is 8 pills of 32mg34mperamide a day)   #######################################################  Ostomy Support Information  You've  heard that people get along just fine with only one of their eyes, or one of their lungs, or one of their kidneys. But you also know that you have only one intestine and only one bladder, and that leaves you feeling awfully empty, both physically and emotionally: You think no other people go around without part of their intestine with the ends of their intestines sticking out through their abdominal walls.   YOU ARE NOT ALONE.  There are nearly three quarters of a million people in the [place] have an ostomy; people who have had surgery to remove all or part of their colons or bladders.   There is even a national association, the [employer] [place] over 350 local affiliated support groups that are organized by volunteers who provide peer support and counseling. [employer] a toll free telephone num-ber, 800-562-486-4992 an educational, interactive website, www.ostomy.org   An ostomy is an opening in the belly (abdominal wall) made by surgery. Ostomates are people who have had this procedure. The opening (stoma) allows the kidney or bowel to grdischarge waste. An external pouch covers the stoma to collect waste. Pouches are are a simple bag and are odor free. Different companies have disposable or reusable pouches to fit one's lifestyle. An ostomy can either be temporary or permanent.   THERE ARE THREE MAIN TYPES OF OSTOMIES Colostomy. A colostomy is a surgically created opening in the large intestine (colon). Ileostomy. An ileostomy is a surgically created opening in the small intestine. Urostomy. A urostomy is a surgically created opening to divert urine away from the bladder.  OSTOMY Care  The following  guidelines will make care of your colostomy easier. Keep this information close by for quick reference.  Helpful DIET hints Eat a well-balanced diet including vegetables and fresh fruits. Eat on a regular schedule.  Drink at least 6 to 8 glasses of fluids daily. Eat slowly in  a relaxed atmosphere. Chew your food thoroughly. Avoid chewing gum, smoking, and drinking from a straw. This will help decrease the amount of air you swallow, which may help reduce gas. Eating yogurt or drinking buttermilk may help reduce gas.  To control gas at night, do not eat after 8 p.m. This will give your bowel time to quiet down before you go to bed.  If gas is a problem, you can purchase Beano. Sprinkle Beano on the first bite of food before eating to reduce gas. It has no flavor and should not change the taste of your food. You can buy Beano over the counter at your local drugstore.  Foods like fish, onions, garlic, broccoli, asparagus, and cabbage produce odor. Although your pouch is odor-proof, if you eat these foods you may notice a stronger odor when emptying your pouch. If this is a concern, you may want to limit these foods in your diet.  If you have an ileostomy, you will have chronic diarrhea & need to drink more liquids to avoid getting dehydrated.  Consider antidiarrheal medicine like imodium (loperamide) or Lomotil to help slow down bowel movements / diarrhea into your ileostomy bag.  GETTING TO GOOD BOWEL HEALTH WITH AN ILEOSTOMY    With the colon bypassed & not in use, you will have small bowel diarrhea.   It is important to thicken & slow your bowel movements down.   The goal: 4-6 small BOWEL MOVEMENTS A DAY It is important to drink plenty of liquids to avoid getting dehydrated  CONTROLLING ILEOSTOMY DIARRHEA  TAKE A FIBER SUPPLEMENT (FiberCon or Benefiner soluble fiber) twice a day - to thicken stools by absorbing excess fluid and retrain the intestines to act more normally.  Slowly increase the dose over a few weeks.  Too much fiber too soon can backfire and cause cramping & bloating.  TAKE AN IRON SUPPLEMENT twice a day to naturally constipate your bowels.  Usually ferrous sulfate 387m twice a day)  TAKE ANTI-DIARRHEAL MEDICINES: Loperamide (Imodium) can slow  down diarrhea.  Start with two tablets (= 473m first and then try one tablet every 6 hours.  Can go up to 2 pills four times day (8 pills of 77m53max) Avoid if you are having fevers or severe pain.  If you are not better or start feeling worse, stop all medicines and call your doctor for advice LoMotil (Diphenoxylate / Atropine) is another medicine that can constipate & slow down bowel moevements Pepto Bismol (bismuth) can gently thicken bowels as well  If diarrhea is worse,: drink plenty of liquids and try simpler foods for a few days to avoid stressing your intestines further. Avoid dairy products (especially milk & ice cream) for a short time.  The intestines often can lose the ability to digest lactose when stressed. Avoid foods that cause gassiness or bloating.  Typical foods include beans and other legumes, cabbage, broccoli, and dairy foods.  Every person has some sensitivity to other foods, so listen to our body and avoid those foods that trigger problems for you.Call your doctor if you are getting worse or not better.  Sometimes further testing (cultures, endoscopy, X-ray studies, bloodwork, etc) may be needed to help diagnose and  treat the cause of the diarrhea. Take extra anti-diarrheal medicines (maximum is 8 pills of 17m loperamide a day)   Tips for POUCHING an OSTOMY   Changing Your Pouch The best time to change your pouch is in the morning, before eating or drinking anything. Your stoma can function at any time, but it will function more after eating or drinking.   Applying the pouching system  Place all your equipment close at hand before removing your pouch.  Wash your hands.  Stand or sit in front of a mirror. Use the position that works best for you. Remember that you must keep the skin around the stoma wrinkle-free for a good seal.  Gently remove the used pouch (1-piece system) or the pouch and old wafer (2-piece system). Empty the pouch into the toilet. Save the closure  clip to use again.  Wash the stoma itself and the skin around the stoma. Your stoma may bleed a little when being washed. This is normal. Rinse and pat dry. You may use a wash cloth or soft paper towels (like Bounty), mild soap (like Dial, Safeguard, or [place], and water. Avoid soaps that contain perfumes or lotions.  For a new pouch (1-piece system) or a new wafer (2-piece system), measure your stoma using the stoma guide in each box of supplies.  Trace the shape of your stoma onto the back of the new pouch or the back of the new wafer. Cut out the opening. Remove the paper backing and set it aside.  Optional: Apply a skin barrier powder to surrounding skin if it is irritated (bare or weeping), and dust off the excess. Optional: Apply a skin-prep wipe (such as Skin Prep or All-Kare) to the skin around the stoma, and let it dry. Do not apply this solution if the skin is irritated (red, tender, or broken) or if you have shaved around the stoma. Optional: Apply a skin barrier paste (such as Stomahesive, Coloplast, or Premium) around the opening cut in the back of the pouch or wafer. Allow it to dry for 30 to 60 seconds.  Hold the pouch (1-piece system) or wafer (2-piece system) with the sticky side toward your body. Make sure the skin around the stoma is wrinkle-free. Center the opening on the stoma, then press firmly to your abdomen (Fig. 4). Look in the mirror to check if you are placing the pouch, or wafer, in the right position. For a 2-piece system, snap the pouch onto the wafer. Make sure it snaps into place securely.  Place your hand over the stoma and the pouch or wafer for about 30 seconds. The heat from your hand can help the pouch or wafer stick to your skin.  Add deodorant (such as Super Banish or Nullo) to your pouch. Other options include food extracts such as vanilla oil and peppermint extract. Add about 10 drops of the deodorant to the pouch. Then apply the closure clamp. Note: Do not  use toxic  chemicals or commercial cleaning agents in your pouch. These substances may harm the stoma.  Optional: For extra seal, apply tape to all 4 sides around the pouch or wafer, as if you were framing a picture. You may use any brand of medical adhesive tape. Change your pouch every 5 to 7 days. Change it immediately if a leak occurs.  Wash your hands afterwards.  If you are wearing a 2-piece system, you may use 2 new pouches per week and alternate them. Rinse the pouch with mild  soap and warm water and hang it to dry for the next day. Apply the fresh pouch. Alternate the 2 pouches like this for a week. After a week, change the wafer and begin with 2 new pouches. Place the old pouches in a plastic bag, and put them in the trash.   LIVING WITH AN OSTOMY  Emptying Your Pouch Empty your pouch when it is one-third full (of urine, stool, and/or gas). If you wait until your pouch is fuller than this, it will be more difficult to empty and more noticeable. When you empty your pouch, either put toilet paper in the toilet bowl first, or flush the toilet while you empty the pouch. This will reduce splashing. You can empty the pouch between your legs or to one side while sitting, or while standing or stooping. If you have a 2-piece system, you can snap off the pouch to empty it. Remember that your stoma may function during this time. If you wish to rinse your pouch after you empty it, a [place] baster can be helpful. When using a baster, squirt water up into the pouch through the opening at the bottom. With a 2-piece system, you can snap off the pouch to rinse it. After rinsing  your pouch, empty it into the toilet. When rinsing your pouch at home, put a few granules of Dreft soap in the rinse water. This helps lubricate and freshen your pouch. The inside of your pouch can be sprayed with non-stick cooking oil (Pam spray). This may help reduce stool sticking to the inside of the pouch.  Bathing You may  shower or bathe with your pouch on or off. Remember that your stoma may function during this time.  The materials you use to wash your stoma and the skin around it should be clean, but they do not need to be sterile.  Wearing Your Pouch During hot weather, or if you perspire a lot in general, wear a cover over your pouch. This may prevent a rash on your skin under the pouch. Pouch covers are sold at ostomy supply stores. Wear the pouch inside your underwear for better support. Watch your weight. Any gain or loss of 10 to 15 pounds or more can change the way your pouch fits.  Going Away From Home A collapsible cup (like those that come in travel kits) or a soft plastic squirt bottle with a pull-up top (like a travel bottle for shampoo) can be used for rinsing your pouch when you are away from home. Tilt the opening of the pouch at an upward angle when using a cup to rinse.  Carry wet wipes or extra tissues to use in public bathrooms.  Carry an extra pouching system with you at all times.  Never keep ostomy supplies in the glove compartment of your car. Extreme heat or cold can damage the skin barriers and adhesive wafers on the pouch.  When you travel, carry your ostomy supplies with you at all times. Keep them within easy reach. Do not pack ostomy supplies in baggage that will be checked or otherwise separated from you, because your baggage might be lost. If you're traveling out of the country, it is helpful to have a letter stating that you are carrying ostomy supplies as a medical necessity.  If you need ostomy supplies while traveling, look in the yellow pages of the telephone book under "Surgical Supplies." Or call the local ostomy organization to find out where supplies are available.  Do not  let your ostomy supplies get low. Always order new pouches before you use the last one.  Reducing Odor Limit foods such as broccoli, cabbage, onions, fish, and garlic in your diet to help reduce  odor. Each time you empty your pouch, carefully clean the opening of the pouch, both inside and outside, with toilet paper. Rinse your pouch 1 or 2 times daily after you empty it (see directions for emptying your pouch and going away from home). Add deodorant (such as Super Banish or Nullo) to your pouch. Use air deodorizers in your bathroom. Do not add aspirin to your pouch. Even though aspirin can help prevent odor, it could cause ulcers on your stoma.  When to call the doctor Call the doctor if you have any of the following symptoms: Purple, black, or white stoma Severe cramps lasting more than 6 hours Severe watery discharge from the stoma lasting more than 6 hours No output from the colostomy for 3 days Excessive bleeding from your stoma Swelling of your stoma to more than 1/2-inch larger than usual Pulling inward of your stoma below skin level Severe skin irritation or deep ulcers Bulging or other changes in your abdomen  When to call your ostomy nurse Call your ostomy/enterostomal therapy (WOCN) nurse if any of the following occurs: Frequent leaking of your pouching system Change in size or appearance of your stoma, causing discomfort or problems with your pouch Skin rash or rawness Weight gain or loss that causes problems with your pouch     FREQUENTLY ASKED QUESTIONS   Why haven't you met any of these folks who have an ostomy?  Well, maybe you have! You just did not recognize them because an ostomy doesn't show. It can be kept secret if you wish. Why, maybe some of your best friends, office associates or neighbors have an ostomy ... you never can tell. People facing ostomy surgery have many quality-of-life questions like: Will you bulge? Smell? Make noises? Will you feel waste leaving your body? Will you be a captive of the toilet? Will you starve? Be a social outcast? Get/stay married? Have babies? Easily bathe, go swimming, bend over?  OK, let's look at what you can  expect:   Will you bulge?  Remember, without part of the intestine or bladder, and its contents, you should have a flatter tummy than before. You can expect to wear, with little exception, what you wore before surgery ... and this in-cludes tight clothing and bathing suits.   Will you smell?  Today, thanks to modern odor proof pouching systems, you can walk into an ostomy support group meeting and not smell anything that is foul or offensive. And, for those with an ileostomy or colostomy who are concerned about odor when emptying their pouch, there are in-pouch deodorants that can be used to eliminate any waste odors that may exist.   Will you make noises?  Everyone produces gas, especially if they are an air-swallower. But intestinal sounds that occur from time to time are no differ-ent than a gurgling tummy, and quite often your clothing will muffle any sounds.   Will you feel the waste discharges?  For those with a colostomy or ileostomy there might be a slight pressure when waste leaves your body, but understand that the intestines have no nerve endings, so there will be no unpleasant sensations. Those with a urostomy will probably be unaware of any kidney drainage.   Will you be a captive of the toilet?  Immediately post-op you  will spend more time in the bathroom than you will after your body recovers from surgery. Every person is different, but on average those with an ileostomy or urostomy may empty their pouches 4 to 6 times a day; a little  less if you have a colostomy. The average wear time between pouch system changes is 3 to 5 days and the changing process should take less than 30 minutes.   Will I need to be on a special diet? Most people return to their normal diet when they have recovered from surgery. Be sure to chew your food well, eat a well-balanced diet and drink plenty of fluids. If you experience problems with a certain food, wait a couple of weeks and try it again.  Will  there be odor and noises? Pouching systems are designed to be odor-proof or odor-resistant. There are deodorants that can be used in the pouch. Medications are also available to help reduce odor. Limit gas-producing foods and carbonated beverages. You will experience less gas and fewer noises as you heal from surgery.  How much time will it take to care for my ostomy? At first, you may spend a lot of time learning about your ostomy and how to take care of it. As you become more comfortable and skilled at changing the pouching system, it will take very little time to care for it.   Will I be able to return to work? People with ostomies can perform most jobs. As soon as you have healed from surgery, you should be able to return to work. Heavy lifting (more than 10 pounds) may be discouraged.   What about intimacy? Sexual relationships and intimacy are important and fulfilling aspects of your life. They should continue after ostomy surgery. Intimacy-related concerns should be discussed openly between you and your partner.   Can I wear regular clothing? You do not need to wear special clothing. Ostomy pouches are fairly flat and barely noticeable. Elastic undergarments will not hurt the stoma or prevent the ostomy from functioning.   Can I participate in sports? An ostomy should not limit your involvement in sports. Many people with ostomies are runners, skiers, swimmers or participate in other active lifestyles. Talk with your caregiver first before doing heavy physical activity.  Will you starve?  Not if you follow doctor's orders at each stage of your post-op adjustment. There is no such thing as an "ostomy diet". Some people with an ostomy will be able to eat and tolerate anything; others may find diffi-culty with some foods. Each person is an individual and must determine, by trial, what is best for them. A good practice for all is to drink plenty of water.   Will you be a social outcast?   Have you met anyone who has an ostomy and is a social outcast? Why should you be the first? Only your attitude and self image will effect how you are treated. No confi-dent person is an Occupational psychologist.    PROFESSIONAL HELP   Resources are available if you need help or have questions about your ostomy.   Specially trained nurses called Wound, Ostomy Continence Nurses (WOCN) are available for consultation in most major medical centers.  Consider getting an ostomy consult at an outpatient ostomy clinic.   [facility] has an [facility] run by an Programmer, systems at the [place] [facility].  [phone]. [facility] can help set up an appointment   The [employer] (UOA) is a group  made up of many local chapters throughout the [place]. These local groups hold meetings and provide support to prospective and existing ostomates. They sponsor educational events and have qualified visitors to make personal or telephone visits. Contact the UOA for the chapter nearest you and for other educational publications.  More detailed information can be found in Colostomy Guide, a publication of the [employer] (UOA). Contact UOA at [phone] or visit their web site at [email or web address] The website contains links to other sites, suppliers and resources.  Tree surgeon Start Services: Start at the website to enlist for support.  Your Wound Ostomy (WOCN) nurse may have started this process. https://[email or web address].[email or web address]/securestart Secure Start services are designed to support people as they live their lives with an ostomy or neurogenic bladder. Enrolling is easy and at no cost to the patient. We realize that each person's needs and life journey are different. Through Secure Start services, we want to help people live their life, their way.  #######################################################    Diarrhea Initially after surgery, the stool will be liquid  and watery because of where the ileostomy is located in the intestine. The stool will gradually become thicker over the next few weeks (more like a consistency of pudding or oatmeal). There are some foods that can help make the stool thicker or thinner, but if changes to your diet do not improve stools, then your doctor may be able to recommend some medications to help.  Foods That May Cause Diarrhea (looser or more frequent stool) Alcohol (including beer)  Soup  Caffeinated drinks (especially hot) Apricots (and stone fruits) Spicy foods  Fruit: fresh, canned, or dried Beans, baked or legumes  Gum, sugar free  Fruit juice: apple, grape, orange Bran  High-fat foods  Sugar-free substitutes Broccoli  High-sugar foods  Glucose-free foods containing mannitol or sorbitol Brussels sprouts  Licorice  Tomatoes Cabbage  Milk and dairy foods  Turnip greens/green leafy vegetables, raw Chocolate  Nuts or seeds  Wheat/whole grains Corn  Peaches (stone fruit)  Wine Fried meats, fish poultry  Peas   Plums (stone fruit)  Prune juice or prunes    Foods That May Help Thicken Stool Applesauce  Saltines  Oatmeal (when OK to have fiber) Bananas  Tapioca  Pasta (sauces may increase symptoms) White rice, boiled  Peanut butter, creamy  White bread (not high fiber) Cheese  Potatoes, no skin  Barley (when OK to have fiber) Marshmallows  Pretzels  Yogurt  From Academy of Nutrition and Dietetics

## 2022-04-14 DIAGNOSIS — N179 Acute kidney failure, unspecified: Secondary | ICD-10-CM | POA: Diagnosis not present

## 2022-04-14 LAB — CBC WITH DIFFERENTIAL/PLATELET
Abs Immature Granulocytes: 0.03 10*3/uL (ref 0.00–0.07)
Basophils Absolute: 0 10*3/uL (ref 0.0–0.1)
Basophils Relative: 0 %
Eosinophils Absolute: 0.3 10*3/uL (ref 0.0–0.5)
Eosinophils Relative: 4 %
HCT: 34.9 % — ABNORMAL LOW (ref 36.0–46.0)
Hemoglobin: 11.2 g/dL — ABNORMAL LOW (ref 12.0–15.0)
Immature Granulocytes: 0 %
Lymphocytes Relative: 27 %
Lymphs Abs: 2.2 10*3/uL (ref 0.7–4.0)
MCH: 29.2 pg (ref 26.0–34.0)
MCHC: 32.1 g/dL (ref 30.0–36.0)
MCV: 91.1 fL (ref 80.0–100.0)
Monocytes Absolute: 0.4 10*3/uL (ref 0.1–1.0)
Monocytes Relative: 5 %
Neutro Abs: 5.3 10*3/uL (ref 1.7–7.7)
Neutrophils Relative %: 64 %
Platelets: 248 10*3/uL (ref 150–400)
RBC: 3.83 MIL/uL — ABNORMAL LOW (ref 3.87–5.11)
RDW: 13.4 % (ref 11.5–15.5)
WBC: 8.2 10*3/uL (ref 4.0–10.5)
nRBC: 0 % (ref 0.0–0.2)

## 2022-04-14 LAB — COMPREHENSIVE METABOLIC PANEL
ALT: 12 U/L (ref 0–44)
AST: 11 U/L — ABNORMAL LOW (ref 15–41)
Albumin: 3 g/dL — ABNORMAL LOW (ref 3.5–5.0)
Alkaline Phosphatase: 56 U/L (ref 38–126)
Anion gap: 5 (ref 5–15)
BUN: 32 mg/dL — ABNORMAL HIGH (ref 8–23)
CO2: 22 mmol/L (ref 22–32)
Calcium: 8.8 mg/dL — ABNORMAL LOW (ref 8.9–10.3)
Chloride: 109 mmol/L (ref 98–111)
Creatinine, Ser: 1.67 mg/dL — ABNORMAL HIGH (ref 0.44–1.00)
GFR, Estimated: 34 mL/min — ABNORMAL LOW (ref >60)
Glucose, Bld: 67 mg/dL — ABNORMAL LOW (ref 70–99)
Potassium: 4.3 mmol/L (ref 3.5–5.1)
Sodium: 136 mmol/L (ref 135–145)
Total Bilirubin: 0.3 mg/dL (ref 0.3–1.2)
Total Protein: 6.2 g/dL — ABNORMAL LOW (ref 6.5–8.1)

## 2022-04-14 LAB — MAGNESIUM: Magnesium: 1.8 mg/dL (ref 1.7–2.4)

## 2022-04-14 LAB — PHOSPHORUS: Phosphorus: 3.2 mg/dL (ref 2.5–4.6)

## 2022-04-14 MED ORDER — PROSOURCE PLUS PO LIQD: 30.0000 mL | Freq: Three times a day (TID) | ORAL | Status: DC

## 2022-04-14 MED ORDER — CALCIUM POLYCARBOPHIL 625 MG PO TABS: 625.0000 mg | ORAL_TABLET | Freq: Two times a day (BID) | ORAL | 0 refills | Status: AC

## 2022-04-14 MED ORDER — ENSURE ENLIVE PO LIQD: 237.0000 mL | Freq: Two times a day (BID) | ORAL | Status: DC

## 2022-04-14 MED ORDER — ADULT MULTIVITAMIN W/MINERALS CH: 1.0000 | ORAL_TABLET | Freq: Every day | ORAL | Status: DC

## 2022-04-14 NOTE — Discharge Summary (Signed)
Discharge Summary  Cynthia [name] ZWC:[id number] DOB: Oct 25, 1961  PCP: Denita Lung, MD  Admit date: 04/13/2022 Discharge date: 04/14/2022  Time spent: 35 minutes   Recommendations for Outpatient Follow-up:  Follow up with Dr. 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)  04/13/2022   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[place]) 06/27/2020 04/13/2022    Discharge Condition: Stable   Diet recommendation: Resume previous diet   Vitals:   04/14/22 0348 04/14/22 1337  BP: 103/75 101/70  Pulse: 88 83  Resp: 16 17  Temp: 97.8 F (36.6 C) 97.7 F (36.5 C)  SpO2: 100% 100%    History of present illness:  Per H&P dictated by Dr. [name]: "Cynthia [name] is a 61 y.o. female with medical history significant of anxiety, GERD, MVP, HTN, IBS, RLS, partial bowel resection w/ colostomy, Crohns. Presenting with N/V, abdominal pain and confusion. She has made multiple visits for the same since June 2022. She chronically has a high output ostomy. In the past she has elected to follow with [facility] d/t insurance issues; however, she has been inconsistent on follow up. Her last visit documented with [facility] is  01/08/22. At that time it was recommended that she continue taking imodium and lomotil daily. It was recommended that she follow up with GI. She denies following up with GI in this time. She reports that she had N/V and abdominal pain starting two days ago. She is unaware of what therapies she tried to improve her situation. She does confirmed that is always has large amount of output from her ostomy.  Right now she is confused.  She has no family at bedside to give a history at this time."  04/14/22:  No complaints.   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 She feels better, her appetite is back, good oral intake No new complaints Advised to follow-up with Dr. Hilliard Clark at [facility].  The patient understands and agrees with plan.    Resolving AKI on CKD 3B. Advised to avoid dehydration, the patient understands and agrees with the plan.   Consultations: General surgery Wound care specialist  Discharge Exam: BP 101/70 (BP Location: Right Arm)  Pulse 83   Temp 97.7 F (36.5 C) (Oral)   Resp 17   Ht 5' 4.5" (1.638 m)   Wt 54.2 kg   LMP 06/10/2011 (Exact Date)   SpO2 100%   BMI 20.19 kg/m  General: 61 y.o. year-old female well developed well nourished in no acute distress.  Alert and oriented x3. Cardiovascular: Regular rate and rhythm with no rubs or gallops.  No thyromegaly or JVD noted.   Respiratory: Clear to  auscultation with no wheezes or rales. Good inspiratory effort.  Discharge Instructions You were cared for by a hospitalist during your hospital stay. If you have any questions about your discharge medications or the care you received while you were in the hospital after you are discharged, you can call the unit and asked to speak with the hospitalist on call if the hospitalist that took care of you is not available. Once you are discharged, your primary care physician will handle any further medical issues. Please note that NO REFILLS for any discharge medications will be authorized once you are discharged, as it is imperative that you return to your primary care physician (or establish a relationship with a primary care physician if you do not have one) for your aftercare needs so that they can reassess your need for medications and monitor your lab values.  Discharge Instructions     Amb Referral to Ostomy Clinic   Complete by: As directed    Reason for referral modifiers: Consultation for problems such as pouch leaking or skin irritation      Allergies as of 04/14/2022       Reactions   Nsaids Other (See Comments)   Crohn's disease & CKD   Ace Inhibitors Cough   Iodine Other (See Comments)   The patient said she had a reaction from iodine in some eye drops- caused eyes to turn red;  pt has no reactions to CT contrast        Medication List     STOP taking these medications    cholestyramine light 4 g packet Commonly known as: PREVALITE   ferrous sulfate 325 (65 FE) MG tablet   furosemide 40 MG tablet Commonly known as: LASIX       TAKE these medications    B-D 3CC LUER-LOK SYR 25GX5/8" 25G X 5/8" 3 ML Misc Generic drug: SYRINGE-NEEDLE (DISP) 3 ML Use as directed for octreotide injection   loperamide 2 MG capsule Commonly known as: IMODIUM Take 1 capsule (2 mg total) by mouth 3 (three) times daily as needed for diarrhea or loose stools.   octreotide 30 MG  injection Commonly known as: SANDOSTATIN LAR 30 mg in the morning, at noon, and at bedtime.   polycarbophil 625 MG tablet Commonly known as: FIBERCON Take 1 tablet (625 mg total) by mouth 2 (two) times daily for 20 days.       Allergies  Allergen Reactions   Nsaids Other (See Comments)    Crohn's disease & CKD   Ace Inhibitors Cough   Iodine Other (See Comments)    The patient said she had a reaction from iodine in some eye drops- caused eyes to turn red;  pt has no reactions to CT contrast    Follow-up Information     Rexene Agent, MD. Schedule an appointment as soon as possible for a visit in 1 week(s).   Specialty: Colon and Rectal Surgery Why: To follow up after your hospital stay Contact information: [address]  [address] [place] [address] [phone]                  The results of significant diagnostics from this hospitalization (including imaging, microbiology, ancillary and laboratory) are listed below for reference.    Significant Diagnostic Studies: CT ABDOMEN PELVIS WO CONTRAST  Result Date: 04/13/2022 CLINICAL DATA:  Copious diarrhea from the colostomy site. EXAM: CT ABDOMEN AND PELVIS WITHOUT CONTRAST TECHNIQUE: Multidetector CT imaging of the abdomen and pelvis was performed following the standard protocol without IV contrast. RADIATION DOSE REDUCTION: This exam was performed according to the departmental dose-optimization program which includes automated exposure control, adjustment of the mA and/or kV according to patient size and/or use of iterative reconstruction technique. COMPARISON:  12/12/2021. FINDINGS: Lower chest: No acute abnormality. Hepatobiliary: Normal liver. Small densities in the gallbladder consistent with stones. No wall thickening or inflammation. No bile duct dilation. Pancreas: Unremarkable. No pancreatic ductal dilatation or surrounding inflammatory changes. Spleen: Normal in size without focal abnormality.  Adrenals/Urinary Tract: Adrenal glands are unremarkable. Kidneys are normal, without renal calculi, focal lesion, or hydronephrosis. Bladder is unremarkable. Stomach/Bowel: Normal stomach. Prominent, but not overtly dilated, loops of small bowel in the pelvis similar to the prior CT. Right mid abdomen ileostomy, stable in appearance from the prior study. Another ostomy lies in the left mid abdomen, also unchanged. Stable bowel anastomosis staples along the lower sigmoid colon and right colon. No colonic dilation or wall thickening. No inflammation. Vascular/Lymphatic: No significant vascular findings are present. No enlarged abdominal or pelvic lymph nodes. Reproductive: Status post hysterectomy. No adnexal masses. Other: No abdominal wall hernia or abnormality. No abdominopelvic ascites. Musculoskeletal: No fracture or acute finding.  No bone lesion. IMPRESSION: 1. No acute findings within the abdomen or pelvis. No evidence of bowel obstruction or inflammation. No change in the appearance of the right or left mid abdomen ostomies. Electronically Signed   By: Lajean Manes M.D.   On: 04/13/2022 11:11    Microbiology: No results found for this or any previous visit (from the past 240 hour(s)).   Labs: Basic Metabolic Panel: Recent Labs  Lab 04/13/22 0846 04/13/22 0852 04/14/22 0959  NA 133*  --  136  K 5.3*  --  4.3  CL 104  --  109  CO2 20*  --  22  GLUCOSE 97  --  67*  BUN 43*  --  32*  CREATININE 2.56*  --  1.67*  CALCIUM 9.6  --  8.8*  MG  --  2.4 1.8  PHOS  --   --  3.2   Liver Function Tests: Recent Labs  Lab 04/13/22 0846 04/14/22 0959  AST 19 11*  ALT 16 12  ALKPHOS 76 56  BILITOT 0.4 0.3  PROT 8.5* 6.2*  ALBUMIN 4.3 3.0*   No results for input(s): "LIPASE", "AMYLASE" in the last 168 hours. No results for input(s): "AMMONIA" in the last 168 hours. CBC: Recent Labs  Lab 04/13/22 0846 04/14/22 0959  WBC 10.5 8.2  NEUTROABS  --  5.3  HGB 15.1* 11.2*  HCT 45.9 34.9*   MCV 89.1 91.1  PLT 385 248   Cardiac Enzymes: No results for input(s): "CKTOTAL", "CKMB", "CKMBINDEX", "TROPONINI" in the last 168 hours. BNP: BNP (last 3 results) Recent Labs    10/31/21 1404 12/25/21 0439  BNP 369.2* 314.1*    ProBNP (last 3 results) No results for input(s): "PROBNP" in the last 8760 hours.  CBG: No results for input(s): "GLUCAP" in the  last 168 hours.     Signed:  Kayleen Memos, MD [facility] 04/14/2022, 6:15 PM

## 2022-04-14 NOTE — Care Management CC44 (Signed)
Condition Code 44 Documentation Completed  Patient Details  Name: Cynthia [name] MRN: [id number] Date of Birth: Nov 03, 1961   Condition Code 44 given:  Yes Patient signature on Condition Code 44 notice:  Yes Documentation of 2 MD's agreement:  Yes Code 44 added to claim:  Yes    Vassie Moselle, LCSW 04/14/2022, 3:05 PM

## 2022-04-14 NOTE — Care Management Obs Status (Signed)
[place] NOTIFICATION   Patient Details  Name: AURIANA SCALIA MRN: [id number] Date of Birth: 03-19-1961   Medicare Observation Status Notification Given:  Yes    Vassie Moselle, LCSW 04/14/2022, 3:05 PM

## 2022-04-14 NOTE — Progress Notes (Signed)
Initial Nutrition Assessment  DOCUMENTATION CODES:   Severe malnutrition in context of chronic illness  INTERVENTION:   -Prosource Plus PO TID, each provides 100 kcals and 15g protein  -Multivitamin with minerals daily  -Provided "Ileostomy Nutrition Therapy" handout to patient and placed in AVS  -Given continuous high output from ostomy, will check the following labs: iron anemia panel (including B-12 and folate), Vitamin D, Thiamine, Vitamin C and B-6   NUTRITION DIAGNOSIS:   Severe Malnutrition related to chronic illness (Crohn's ileitis, high output ostomy) as evidenced by percent weight loss, energy intake < or equal to 75% for > or equal to 1 month, moderate fat depletion, severe muscle depletion.  GOAL:   Patient will meet greater than or equal to 90% of their needs  MONITOR:   PO intake, Supplement acceptance, Labs, Weight trends, I & O's  REASON FOR ASSESSMENT:   Consult Diet education  ASSESSMENT:   61 y.o. female with medical history significant of anxiety, GERD, MVP, HTN, IBS, RLS, partial bowel resection w/ colostomy, Crohns. Presenting with N/V, abdominal pain and confusion.  Patient reports not eating well at home. Has been subsisting mainly on mashed potatoes and soups. Does not take any vitamin supplements, doesn't use any protein supplements and has not been drinking any electrolyte drinks.  Provided "ileostomy" handout and reviewed with patient. Pt states she has friends who can sometimes get things for her.  Pt reports "brain fog" and has some issues remembering things. Pt has had education in the past.  She is agreeable to receiving protein supplements. Ensure was initially offered but these are hypertonic which may not help output so will order Prosource for additional protein.  Given poor PO and consistent high output from ostomy, pt at risk of multiple micronutrient deficiencies. Will check the following labs and this was discussed with the patient:  thiamine and Vitamin B-6 given reports of "brain fog" and memory issues, Vitamin D, iron anemia panel and Vitamin C.   Pt weighed in room by RN, 120 lbs. Per weight records, pt has lost 35 lbs since 2/24 (22% wt loss x 3.5 months, significant for time frame).  Medications: Ferrous sulfate, Imodium, Fibercon  Labs reviewed.  NUTRITION - FOCUSED PHYSICAL EXAM:  Flowsheet Row Most Recent Value  Orbital Region Moderate depletion  Upper Arm Region Moderate depletion  Thoracic and Lumbar Region Moderate depletion  Buccal Region Mild depletion  Temple Region Moderate depletion  Clavicle Bone Region Severe depletion  Clavicle and Acromion Bone Region Severe depletion  Scapular Bone Region Severe depletion  Dorsal Hand Moderate depletion  Patellar Region Moderate depletion  Anterior Thigh Region Moderate depletion  Posterior Calf Region Moderate depletion  Edema (RD Assessment) None  Hair Reviewed  Eyes Reviewed  [reports vision changes]  Mouth Reviewed  Skin Reviewed  [red, erythema]  Nails Reviewed  [brittle, easily breaking]       Diet Order:   Diet Order             Diet regular Room service appropriate? Yes; Fluid consistency: Thin  Diet effective now                   EDUCATION NEEDS:   Education needs have been addressed  Skin:  Skin Assessment: Skin Integrity Issues: Skin Integrity Issues:: Other (Comment) Other: irritant dermatitis  Last BM:  6/12 -ileostomy -liquid and green in color  Height:   Ht Readings from Last 1 Encounters:  04/13/22 5' 4.5" (1.638 m)  Weight:   Wt Readings from Last 1 Encounters:  04/13/22 54.2 kg    BMI:  Body mass index is 20.19 kg/m.  Estimated Nutritional Needs:   Kcal:  1700-1900  Protein:  80-95g  Fluid:  >1.9L/day   Clayton Bibles, MS, RD, LDN Inpatient Clinical Dietitian Contact information available via Amion

## 2022-04-14 NOTE — Progress Notes (Signed)
Recommendations for management of ileostomy output as outlined in Dr. Clyda Greener note:  Recommend  triumverate of: -iron twice a day to constipate -FiberCon twice a day to thicken SB effluent "stools" -Imodium 4 times daily to slow down and thicken stools.    Patient has a hx of non-compliance with meds and has been on multiple regimens at this point. She follows with Dr. Hilliard Clark, would recommend reaching out to his team at [facility] and see if he has any other/different recommendations.   General surgery will not follow acutely since she does not have an acute surgical issue, but we are available as needed.   Norm Parcel, [facility] Surgery 04/14/2022, 1:54 PM Please see Amion for pager number during day hours 7:00am-4:30pm

## 2022-04-16 ENCOUNTER — Telehealth: Payer: Self-pay

## 2022-04-16 NOTE — Telephone Encounter (Signed)
Transition Care Management Unsuccessful Follow-up Telephone Call  Date of discharge and from where:  04/13/22-04/14/22 Cynthia [name]  Attempts:  1st Attempt  Reason for unsuccessful TCM follow-up call:  Unable to leave message, voicemail not set up.  Johnney Killian, RN, BSN, CCM Care Management Coordinator [facility] Internal Medicine Phone: [phone]: [phone]

## 2022-04-25 ENCOUNTER — Ambulatory Visit (HOSPITAL_COMMUNITY): Payer: Medicare Other | Admitting: Nurse Practitioner

## 2022-05-09 ENCOUNTER — Encounter (HOSPITAL_COMMUNITY): Payer: Self-pay | Admitting: Nurse Practitioner

## 2022-05-16 ENCOUNTER — Encounter (HOSPITAL_COMMUNITY): Payer: Self-pay | Admitting: Nurse Practitioner

## 2022-05-27 ENCOUNTER — Encounter (HOSPITAL_COMMUNITY): Payer: Self-pay | Admitting: Nurse Practitioner

## 2022-06-14 ENCOUNTER — Other Ambulatory Visit (HOSPITAL_COMMUNITY): Payer: Self-pay

## 2022-06-26 ENCOUNTER — Telehealth: Payer: Self-pay | Admitting: Family Medicine

## 2022-06-26 NOTE — Telephone Encounter (Signed)
Pt called in and is asking if you can refill octreotide, an injection she got filled at the hospital, she states she takes the shot 3 times a day. If you can she prefers [facility] on [address]

## 2022-06-30 ENCOUNTER — Telehealth (INDEPENDENT_AMBULATORY_CARE_PROVIDER_SITE_OTHER): Payer: Medicare Other | Admitting: Family Medicine

## 2022-06-30 ENCOUNTER — Encounter: Payer: Self-pay | Admitting: Family Medicine

## 2022-06-30 VITALS — Ht 64.0 in | Wt 120.0 lb

## 2022-06-30 DIAGNOSIS — Z932 Ileostomy status: Secondary | ICD-10-CM

## 2022-06-30 DIAGNOSIS — K50013 Crohn's disease of small intestine with fistula: Secondary | ICD-10-CM

## 2022-06-30 DIAGNOSIS — R198 Other specified symptoms and signs involving the digestive system and abdomen: Secondary | ICD-10-CM

## 2022-06-30 DIAGNOSIS — Z91199 Patient's noncompliance with other medical treatment and regimen due to unspecified reason: Secondary | ICD-10-CM

## 2022-06-30 MED ORDER — OCTREOTIDE ACETATE 30 MG IM KIT: 30.0000 mg | PACK | Freq: Three times a day (TID) | INTRAMUSCULAR | 1 refills | Status: DC

## 2022-06-30 MED ORDER — SYRINGE/NEEDLE (DISP) 25G X 5/8" 3 ML MISC
0 refills | Status: DC
Start: 2022-06-30 — End: 2022-07-03

## 2022-06-30 NOTE — Progress Notes (Signed)
   Subjective:    Patient ID: Cynthia [name], female    DOB: 1961-03-19, 61 y.o.   MRN: [id number]  HPI Documentation for virtual audio and video telecommunications through [place] encounter:  The patient was located at home. 2 patient identifiers used.  The provider was located in the office. The patient did consent to this visit and is aware of possible charges through their insurance for this visit.  The other persons participating in this telemedicine service were none. Time spent on call was 10 minutes and in review of previous records >19 minutes total for counseling and coordination of care.  This virtual service is not related to other E/M service within previous 7 days.  She has a history of ulcerative colitis and an ileostomy.  Her last admission to the hospital was in June.  She has a long history of noncompliance but seems to have done well in the last several months.  The last admission was for treatment of acute renal failure as well as her underlying Crohn's.  Since being home she has done fairly well but needs a refill on her Sandostatin that she uses 3 times per day.  Review of Systems     Objective:   Physical Exam Alert and in no distress otherwise not examined       Assessment & Plan:  Cynthia [name] I renewed her medication.  She is behind in seeing her gastroenterologist for follow-up on the Crohn's disease as well as a ileostomy.

## 2022-07-01 ENCOUNTER — Emergency Department (HOSPITAL_COMMUNITY): Payer: Medicare Other

## 2022-07-01 ENCOUNTER — Encounter (HOSPITAL_COMMUNITY): Payer: Self-pay | Admitting: Family Medicine

## 2022-07-01 ENCOUNTER — Telehealth: Payer: Self-pay

## 2022-07-01 ENCOUNTER — Observation Stay (HOSPITAL_COMMUNITY)
Admission: EM | Admit: 2022-07-01 | Discharge: 2022-07-03 | Disposition: A | Discharge status: Home / Self Care | Payer: Medicare Other | Attending: Internal Medicine | Admitting: Internal Medicine

## 2022-07-01 DIAGNOSIS — R198 Other specified symptoms and signs involving the digestive system and abdomen: Secondary | ICD-10-CM | POA: Diagnosis not present

## 2022-07-01 DIAGNOSIS — Z932 Ileostomy status: Secondary | ICD-10-CM | POA: Diagnosis not present

## 2022-07-01 DIAGNOSIS — Z79899 Other long term (current) drug therapy: Secondary | ICD-10-CM | POA: Diagnosis not present

## 2022-07-01 DIAGNOSIS — N1832 Chronic kidney disease, stage 3b: Secondary | ICD-10-CM | POA: Insufficient documentation

## 2022-07-01 DIAGNOSIS — Z9049 Acquired absence of other specified parts of digestive tract: Secondary | ICD-10-CM | POA: Diagnosis not present

## 2022-07-01 DIAGNOSIS — L03311 Cellulitis of abdominal wall: Principal | ICD-10-CM | POA: Diagnosis present

## 2022-07-01 DIAGNOSIS — N183 Chronic kidney disease, stage 3 unspecified: Secondary | ICD-10-CM | POA: Diagnosis present

## 2022-07-01 DIAGNOSIS — K50013 Crohn's disease of small intestine with fistula: Secondary | ICD-10-CM

## 2022-07-01 DIAGNOSIS — R1084 Generalized abdominal pain: Secondary | ICD-10-CM | POA: Diagnosis present

## 2022-07-01 DIAGNOSIS — F17210 Nicotine dependence, cigarettes, uncomplicated: Secondary | ICD-10-CM | POA: Insufficient documentation

## 2022-07-01 LAB — LIPASE, BLOOD: Lipase: 39 U/L (ref 11–51)

## 2022-07-01 LAB — COMPREHENSIVE METABOLIC PANEL
ALT: 15 U/L (ref 0–44)
AST: 19 U/L (ref 15–41)
Albumin: 3.4 g/dL — ABNORMAL LOW (ref 3.5–5.0)
Alkaline Phosphatase: 62 U/L (ref 38–126)
Anion gap: 8 (ref 5–15)
BUN: 32 mg/dL — ABNORMAL HIGH (ref 8–23)
CO2: 17 mmol/L — ABNORMAL LOW (ref 22–32)
Calcium: 9.2 mg/dL (ref 8.9–10.3)
Chloride: 116 mmol/L — ABNORMAL HIGH (ref 98–111)
Creatinine, Ser: 1.48 mg/dL — ABNORMAL HIGH (ref 0.44–1.00)
GFR, Estimated: 40 mL/min — ABNORMAL LOW (ref >60)
Glucose, Bld: 83 mg/dL (ref 70–99)
Potassium: 4.1 mmol/L (ref 3.5–5.1)
Sodium: 141 mmol/L (ref 135–145)
Total Bilirubin: 0.3 mg/dL (ref 0.3–1.2)
Total Protein: 6.5 g/dL (ref 6.5–8.1)

## 2022-07-01 LAB — CBC WITH DIFFERENTIAL/PLATELET
Abs Immature Granulocytes: 0.04 10*3/uL (ref 0.00–0.07)
Basophils Absolute: 0 10*3/uL (ref 0.0–0.1)
Basophils Relative: 0 %
Eosinophils Absolute: 0.4 10*3/uL (ref 0.0–0.5)
Eosinophils Relative: 4 %
HCT: 40.8 % (ref 36.0–46.0)
Hemoglobin: 12.6 g/dL (ref 12.0–15.0)
Immature Granulocytes: 0 %
Lymphocytes Relative: 21 %
Lymphs Abs: 2 10*3/uL (ref 0.7–4.0)
MCH: 30.4 pg (ref 26.0–34.0)
MCHC: 30.9 g/dL (ref 30.0–36.0)
MCV: 98.3 fL (ref 80.0–100.0)
Monocytes Absolute: 0.5 10*3/uL (ref 0.1–1.0)
Monocytes Relative: 6 %
Neutro Abs: 6.6 10*3/uL (ref 1.7–7.7)
Neutrophils Relative %: 69 %
Platelets: 250 10*3/uL (ref 150–400)
RBC: 4.15 MIL/uL (ref 3.87–5.11)
RDW: 12.5 % (ref 11.5–15.5)
WBC: 9.6 10*3/uL (ref 4.0–10.5)
nRBC: 0 % (ref 0.0–0.2)

## 2022-07-01 LAB — LACTIC ACID, PLASMA: Lactic Acid, Venous: 1.1 mmol/L (ref 0.5–1.9)

## 2022-07-01 MED ORDER — PIPERACILLIN-TAZOBACTAM 3.375 G IVPB
3.3750 g | Freq: Three times a day (TID) | INTRAVENOUS | Status: DC
  Administered 2022-07-01: 3.375 g via INTRAVENOUS
  Filled 2022-07-01: qty 50

## 2022-07-01 MED ORDER — OXYCODONE HCL 5 MG PO TABS
5.0000 mg | ORAL_TABLET | ORAL | Status: DC | PRN
  Administered 2022-07-02: 5 mg via ORAL
  Filled 2022-07-01: qty 2
  Filled 2022-07-01: qty 1

## 2022-07-01 MED ORDER — IOHEXOL 350 MG/ML SOLN
80.0000 mL | Freq: Once | INTRAVENOUS | Status: AC | PRN
Start: 2022-07-01 — End: 2022-07-01
  Administered 2022-07-01: 80 mL via INTRAVENOUS

## 2022-07-01 MED ORDER — ENOXAPARIN SODIUM 40 MG/0.4ML IJ SOSY
40.0000 mg | PREFILLED_SYRINGE | Freq: Every day | INTRAMUSCULAR | Status: DC
Start: 2022-07-02 — End: 2022-07-03
  Administered 2022-07-02 (×2): 40 mg via SUBCUTANEOUS
  Filled 2022-07-01 (×2): qty 0.4

## 2022-07-01 MED ORDER — POLYETHYLENE GLYCOL 3350 17 G PO PACK: 17.0000 g | PACK | Freq: Every day | ORAL | Status: DC | PRN

## 2022-07-01 MED ORDER — OCTREOTIDE ACETATE 50 MCG/ML IJ SOLN
50.0000 ug | Freq: Three times a day (TID) | INTRAMUSCULAR | Status: DC
  Administered 2022-07-02 – 2022-07-03 (×6): 50 ug via SUBCUTANEOUS
  Filled 2022-07-01 (×8): qty 1

## 2022-07-01 MED ORDER — FENTANYL CITRATE PF 50 MCG/ML IJ SOSY
50.0000 ug | PREFILLED_SYRINGE | Freq: Once | INTRAMUSCULAR | Status: AC
  Administered 2022-07-01: 50 ug via INTRAVENOUS
  Filled 2022-07-01: qty 1

## 2022-07-01 MED ORDER — ACETAMINOPHEN 650 MG RE SUPP: 650.0000 mg | Freq: Four times a day (QID) | RECTAL | Status: DC | PRN

## 2022-07-01 MED ORDER — AMOXICILLIN-POT CLAVULANATE 875-125 MG PO TABS
1.0000 | ORAL_TABLET | Freq: Two times a day (BID) | ORAL | Status: DC
  Administered 2022-07-02 – 2022-07-03 (×3): 1 via ORAL
  Filled 2022-07-01 (×3): qty 1

## 2022-07-01 MED ORDER — ACETAMINOPHEN 325 MG PO TABS
650.0000 mg | ORAL_TABLET | Freq: Four times a day (QID) | ORAL | Status: DC | PRN
  Administered 2022-07-03: 650 mg via ORAL

## 2022-07-01 NOTE — H&P (Signed)
History and Physical    Cynthia [name] ZOX:[id number] DOB: November 12, 1960 DOA: 07/01/2022  PCP: Ronnald Nian, MD   Patient coming from: Home   Chief Complaint: Abdominal pain and redness   HPI: Cynthia [name] is a 61 y.o. female with medical history significant for Crohn's disease status post partial bowel resection, high output ostomy, and CKD 3B, now presenting to the emergency department with increasing abdominal wall redness and pain.  Patient reports that she has not been using an ostomy bag recently and has developed worsening redness and pain surrounding the stoma.  She denies fevers or chills.  ED Course: Upon arrival to the ED, patient is found to be afebrile and saturating well on room air with stable blood pressure.  Chemistry panel with creatinine 1.48.  CT abdomen pelvis negative for acute intra-abdominal or pelvic abnormality.  Blood cultures were collected in the ED and antibiotics were ordered.  Review of Systems:  All other systems reviewed and apart from HPI, are negative.  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   GERD (gastroesophageal reflux disease)    Headache(784.0)    MIGRAINE   Heart murmur    Hiatal hernia    History of noncompliance with medical treatment 04/13/2022   History of rectal polyp 2020 01/12/2021   History of serrated colonic polyp 01/13/2011   Hypertension    Incontinence    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HCC)    MVP (mitral valve prolapse)    RLS (restless legs syndrome) 09/25/2014   Sepsis due to cellulitis (HCC) 04/05/2021    Past Surgical History:  Procedure Laterality  Date   ABDOMINAL HYSTERECTOMY  06/17/2011   Procedure: HYSTERECTOMY ABDOMINAL;  Surgeon: Melony Overly;  Location: [facility];  Service: Gynecology;  Laterality: N/A;  Converted to Abdominal Hysterectomy with lysis of adhesions    ANTERIOR AND POSTERIOR REPAIR  02/25/2012   Procedure: ANTERIOR (CYSTOCELE) AND POSTERIOR REPAIR (RECTOCELE);  Surgeon: Loney Laurence, MD;  Location: [facility];  Service: Gynecology;  Laterality: N/A;  ANterior cystocele repair ONLY   BIOPSY  08/29/2019   Procedure: BIOPSY;  Surgeon: Shellia Cleverly, DO;  Location: [facility];  Service: Gastroenterology;;   BLADDER SUSPENSION  02/25/2012   Procedure: TRANSVAGINAL TAPE (TVT) PROCEDURE;  Surgeon: Loney Laurence, MD;  Location: [facility];  Service: Gynecology;  Laterality: N/A;   COLECTOMY WITH COLOSTOMY CREATION/HARTMANN PROCEDURE  02/19/2021   Sigmoid colectomy w descnding colon MF/ostomy   COLONOSCOPY WITH PROPOFOL N/A 08/29/2019   Procedure: COLONOSCOPY WITH PROPOFOL;  Surgeon: Shellia Cleverly, DO;  Location: [facility];  Service: Gastroenterology;  Laterality: N/A;   CYSTOSCOPY  02/25/2012   Procedure: CYSTOSCOPY;  Surgeon: Loney Laurence, MD;  Location: [facility];  Service: Gynecology;  Laterality: N/A;   ILEOCECETOMY  02/19/2021   Dr Oren Bracket @ [facility]   ILEOSTOMY  02/19/2021   [facility] - Dr Oren Bracket   KNEE ARTHROSCOPY  2010   LAPAROSCOPIC ASSISTED VAGINAL HYSTERECTOMY  06/17/2011   Procedure: LAPAROSCOPIC ASSISTED VAGINAL HYSTERECTOMY;  Surgeon: Melony Overly;  Location: [facility];  Service: Gynecology;  Laterality: N/A;  Attempted  MUCUS FISTULA - RECTOSIGMOID  02/19/2021   POLYPECTOMY  08/29/2019   Procedure: POLYPECTOMY;  Surgeon: Shellia Cleverly, DO;  Location: [facility];  Service: Gastroenterology;;   SALPINGOOPHORECTOMY  06/17/2011   Procedure: SALPINGO OOPHERECTOMY;  Surgeon: Melony Overly;  Location: [facility];  Service: Gynecology;  Laterality: Bilateral;   TONSILLECTOMY      Social History:    reports that she has been smoking cigarettes. She has never used smokeless tobacco. She reports that she does not currently use alcohol. She reports that she does not currently use drugs after having used the following drugs: Cocaine and Marijuana.  Allergies  Allergen Reactions   Nsaids Other (See Comments)    Crohn's disease & CKD   Ace Inhibitors Cough   Iodine Other (See Comments)    The patient said she had a reaction from iodine in some eye drops- caused eyes to turn red;  pt has no reactions to CT contrast    Family History  Problem Relation Age of Onset   Diabetes Mother    Dementia Mother    Clotting disorder Father    Macular degeneration Father    Crohn's disease Sister    Colon cancer Neg Hx    Esophageal cancer Neg Hx    Pancreatic cancer Neg Hx    Stomach cancer Neg Hx    Liver disease Neg Hx      Prior to Admission medications   Medication Sig Start Date End Date Taking? Authorizing Provider  octreotide (SANDOSTATIN) 50 MCG/ML SOLN injection Inject 50 mcg into the skin. Inject 2 ml into skin Q 8 hours   Yes [provider]  loperamide (IMODIUM) 2 MG capsule Take 1 capsule (2 mg total) by mouth 3 (three) times daily as needed for diarrhea or loose stools. Patient not taking: Reported on 06/30/2022 12/19/21   Rhetta Mura, MD  SYRINGE-NEEDLE, DISP, 3 ML 25G X 5/8" 3 ML MISC Use as directed for octreotide injection 06/30/22   Ronnald Nian, MD    Physical Exam: Vitals:   07/02/22 0045 07/02/22 0128 07/02/22 0130 07/02/22 0200  BP: 130/84  133/87 121/78  Pulse: 83  79 75  Resp: 15  13 19   Temp:  98.7 F (37.1 C)    TempSrc:  Oral    SpO2: 99%  100% 98%  Weight:      Height:        Constitutional: NAD, calm  Eyes: PERTLA, lids and conjunctivae normal ENMT: Mucous membranes are moist. Posterior pharynx clear of any exudate or lesions.   Neck: supple, no masses  Respiratory:  no wheezing, no crackles. No accessory muscle use.   Cardiovascular: S1 & S2 heard, regular rate and rhythm. No extremity edema.  . Abdomen: No distension, no tenderness, soft. Bowel sounds active.  Musculoskeletal: no clubbing / cyanosis. No joint deformity upper and lower extremities.   Skin: Abdominal wall erythema surrounding stoma. Warm, dry, well-perfused. Neurologic: CN 2-12 grossly intact. Moving all extremities. Alert and oriented.  Psychiatric: Calm. Cooperative.    Labs and Imaging on Admission: I have personally reviewed following labs and imaging studies  CBC: Recent Labs  Lab 07/01/22 2048 07/02/22 0138  WBC 9.6 9.3  NEUTROABS 6.6  --   HGB 12.6 12.1  HCT 40.8 39.4  MCV 98.3 98.7  PLT 250 260   Basic Metabolic Panel: Recent Labs  Lab 07/01/22 2048 07/02/22 0138  NA 141 140  K 4.1 3.9  CL 116* 111  CO2 17*  19*  GLUCOSE 83 155*  BUN 32* 28*  CREATININE 1.48* 1.48*  CALCIUM 9.2 8.9   GFR: Estimated Creatinine Clearance: 33.8 mL/min (A) (by C-G formula based on SCr of 1.48 mg/dL (H)). Liver Function Tests: Recent Labs  Lab 07/01/22 2048  AST 19  ALT 15  ALKPHOS 62  BILITOT 0.3  PROT 6.5  ALBUMIN 3.4*   Recent Labs  Lab 07/01/22 2048  LIPASE 39   No results for input(s): "AMMONIA" in the last 168 hours. Coagulation Profile: No results for input(s): "INR", "PROTIME" in the last 168 hours. Cardiac Enzymes: No results for input(s): "CKTOTAL", "CKMB", "CKMBINDEX", "TROPONINI" in the last 168 hours. BNP (last 3 results) No results for input(s): "PROBNP" in the last 8760 hours. HbA1C: No results for input(s): "HGBA1C" in the last 72 hours. CBG: No results for input(s): "GLUCAP" in the last 168 hours. Lipid Profile: No results for input(s): "CHOL", "HDL", "LDLCALC", "TRIG", "CHOLHDL", "LDLDIRECT" in the last 72 hours. Thyroid Function Tests: No results for input(s): "TSH", "T4TOTAL", "FREET4", "T3FREE", "THYROIDAB" in the last 72 hours. Anemia Panel: No results for input(s): "VITAMINB12",  "FOLATE", "FERRITIN", "TIBC", "IRON", "RETICCTPCT" in the last 72 hours. Urine analysis:    Component Value Date/Time   COLORURINE YELLOW 04/13/2022 1258   APPEARANCEUR HAZY (A) 04/13/2022 1258   LABSPEC 1.026 04/13/2022 1258   LABSPEC 1.010 01/03/2022 1358   PHURINE 5.0 04/13/2022 1258   GLUCOSEU NEGATIVE 04/13/2022 1258   HGBUR NEGATIVE 04/13/2022 1258   BILIRUBINUR NEGATIVE 04/13/2022 1258   BILIRUBINUR negative 01/03/2022 1358   BILIRUBINUR n 12/04/2011 1014   KETONESUR NEGATIVE 04/13/2022 1258   PROTEINUR 30 (A) 04/13/2022 1258   UROBILINOGEN negative 12/04/2011 1014   NITRITE NEGATIVE 04/13/2022 1258   LEUKOCYTESUR MODERATE (A) 04/13/2022 1258   Sepsis Labs: @LABRCNTIP (procalcitonin:4,lacticidven:4) )No results found for this or any previous visit (from the past 240 hour(s)).   Radiological Exams on Admission: CT ABDOMEN PELVIS W CONTRAST  Result Date: 07/01/2022 CLINICAL DATA:  Abdominal pain, history of Crohn's colitis with ostomy. EXAM: CT ABDOMEN AND PELVIS WITH CONTRAST TECHNIQUE: Multidetector CT imaging of the abdomen and pelvis was performed using the standard protocol following bolus administration of intravenous contrast. RADIATION DOSE REDUCTION: This exam was performed according to the departmental dose-optimization program which includes automated exposure control, adjustment of the mA and/or kV according to patient size and/or use of iterative reconstruction technique. CONTRAST:  80mL OMNIPAQUE IOHEXOL 350 MG/ML SOLN COMPARISON:  04/13/2022 FINDINGS: Lower chest: No acute abnormality. Hepatobiliary: No focal liver abnormality is seen. No gallstones, gallbladder wall thickening, or biliary dilatation. Pancreas: Unremarkable. No pancreatic ductal dilatation or surrounding inflammatory changes. Spleen: Normal in size without focal abnormality. Adrenals/Urinary Tract: Adrenal glands are within normal limits. Kidneys demonstrate a normal enhancement pattern bilaterally. No  renal calculi or obstructive changes are noted. Delayed images demonstrate normal excretion. The bladder is partially distended. Stomach/Bowel: Hartmann's pouch is identified deep in the pelvis. Right-sided ileostomy is again identified. 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 No obstructive changes are seen. Stomach is within limits. Vascular/Lymphatic: Aortic atherosclerosis. No enlarged abdominal or pelvic lymph nodes. Reproductive: Status post hysterectomy. No adnexal masses. Other: No abdominal wall hernia or abnormality. No abdominopelvic ascites. Musculoskeletal: No acute or significant osseous findings. IMPRESSION: Postsurgical changes are noted consistent with right-sided ileostomy and left-sided mucous fistula. No obstructive changes are noted. No other focal abnormality is noted. No significant interval change from the prior study is seen. Electronically Signed   By: Alcide Clever  M.D.   On: 07/01/2022 22:18     Assessment/Plan   1. Abdominal wall cellulitis  - Has not been using ostomy bag recently and presents with worsening pain and erythema surrounding her stoma where bowel contents have been draining   - She has dermatitis with possible superimposed infection but is not septic on admission  - Place ostomy bag, treat possible cellulitis with Augmentin, consult TOC for help obtaining ostomy supplies    2. CKD IIIb  - SCr is 1.48 on admission, appears close to baseline  - Renally-dose medications, monitor    3. High output ostomy  - Continue octreotide    DVT prophylaxis: Lovenox  Code Status: Full  Level of Care: Level of care: Med-Surg Family Communication: None present  Disposition Plan:  Patient is from: home  Anticipated d/c is to: Home  Anticipated d/c date is: 8/30 or 07/03/22  Patient currently: pending pain-control, stable/improved skin changes  Consults called: None  Admission status: Observation     Briscoe Deutscher, MD [facility]  07/02/2022,  3:11 AM

## 2022-07-01 NOTE — Telephone Encounter (Signed)
Pt med was changed and called into pharmacy. [place]
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
## 2022-07-01 NOTE — Progress Notes (Signed)
Pharmacy Antibiotic Note  Cynthia [name] is a 61 y.o. female admitted on 07/01/2022 with  SSTI surrounding stoma site .  Pharmacy has been consulted for Zosyn (piperacillin-tazobactam) dosing.  WBC 9.6, afebrile, LA 1.1 SCr 1.48  Plan: Zosyn 3.375g IV q8h (4 hour infusion). Monitor daily CBC, temp, SCr, and for clinical signs of improvement  F/u cultures and de-escalate antibiotics as able   Height: 5' 4"  (162.6 cm) Weight: 54.4 kg (120 lb) IBW/kg (Calculated) : 54.7  Temp (24hrs), Avg:98.6 F (37 C), Min:98.6 F (37 C), Max:98.6 F (37 C)  Recent Labs  Lab 07/01/22 2048  WBC 9.6  CREATININE 1.48*  LATICACIDVEN 1.1    Estimated Creatinine Clearance: 33.8 mL/min (A) (by C-G formula based on SCr of 1.48 mg/dL (H)).    Allergies  Allergen Reactions   Nsaids Other (See Comments)    Crohn's disease & CKD   Ace Inhibitors Cough   Iodine Other (See Comments)    The patient said she had a reaction from iodine in some eye drops- caused eyes to turn red;  pt has no reactions to CT contrast    Antimicrobials this admission: Zosyn 8/29 >>   Dose adjustments this admission: N/A  Microbiology results: 8/29 BCx: pending  Thank you for allowing pharmacy to be a part of this patient's care.  Luisa Hart, PharmD, BCPS Clinical Pharmacist 07/01/2022 10:58 PM   Please refer to [facility] for pharmacy phone number

## 2022-07-01 NOTE — ED Triage Notes (Signed)
Pt BIB [facility] from home with c/o pain to stoma for 3-4 days. Stoma and surrounding skin red and hot to touch. EMS gave 300 of LR and 50 mcg of fentanyl. Pt AO4, VSS

## 2022-07-02 ENCOUNTER — Other Ambulatory Visit: Payer: Self-pay

## 2022-07-02 DIAGNOSIS — L03311 Cellulitis of abdominal wall: Secondary | ICD-10-CM | POA: Diagnosis not present

## 2022-07-02 LAB — BASIC METABOLIC PANEL
Anion gap: 10 (ref 5–15)
BUN: 28 mg/dL — ABNORMAL HIGH (ref 8–23)
CO2: 19 mmol/L — ABNORMAL LOW (ref 22–32)
Calcium: 8.9 mg/dL (ref 8.9–10.3)
Chloride: 111 mmol/L (ref 98–111)
Creatinine, Ser: 1.48 mg/dL — ABNORMAL HIGH (ref 0.44–1.00)
GFR, Estimated: 40 mL/min — ABNORMAL LOW (ref >60)
Glucose, Bld: 155 mg/dL — ABNORMAL HIGH (ref 70–99)
Potassium: 3.9 mmol/L (ref 3.5–5.1)
Sodium: 140 mmol/L (ref 135–145)

## 2022-07-02 LAB — CBC
HCT: 39.4 % (ref 36.0–46.0)
Hemoglobin: 12.1 g/dL (ref 12.0–15.0)
MCH: 30.3 pg (ref 26.0–34.0)
MCHC: 30.7 g/dL (ref 30.0–36.0)
MCV: 98.7 fL (ref 80.0–100.0)
Platelets: 260 10*3/uL (ref 150–400)
RBC: 3.99 MIL/uL (ref 3.87–5.11)
RDW: 12.5 % (ref 11.5–15.5)
WBC: 9.3 10*3/uL (ref 4.0–10.5)
nRBC: 0 % (ref 0.0–0.2)

## 2022-07-02 MED ORDER — ORAL CARE MOUTH RINSE: 15.0000 mL | OROMUCOSAL | Status: DC | PRN

## 2022-07-02 NOTE — ED Notes (Signed)
Provider at bedside. Pt medication held at this time.

## 2022-07-02 NOTE — ED Notes (Signed)
Pt moved to room 37 at this time

## 2022-07-02 NOTE — ED Notes (Signed)
Ostomy dressing applied to stoma site, dressing clean and dry. Stoma and surrounding skin red and hot to touch. Full linen change.
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
## 2022-07-02 NOTE — ED Notes (Signed)
Received verbal report from Mason Jim RN at this time
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
## 2022-07-02 NOTE — Progress Notes (Signed)
1630- patietnt arrived onto unit. A&O x4. Ambulates independently. Denies pain. Colostomy in place, soft green output.  Updates on plan of care. All questions/concerns addressed.

## 2022-07-03 ENCOUNTER — Other Ambulatory Visit (HOSPITAL_COMMUNITY): Payer: Self-pay

## 2022-07-03 DIAGNOSIS — L03311 Cellulitis of abdominal wall: Secondary | ICD-10-CM | POA: Diagnosis not present

## 2022-07-03 LAB — BASIC METABOLIC PANEL
Anion gap: 6 (ref 5–15)
BUN: 31 mg/dL — ABNORMAL HIGH (ref 8–23)
CO2: 21 mmol/L — ABNORMAL LOW (ref 22–32)
Calcium: 9.1 mg/dL (ref 8.9–10.3)
Chloride: 112 mmol/L — ABNORMAL HIGH (ref 98–111)
Creatinine, Ser: 1.88 mg/dL — ABNORMAL HIGH (ref 0.44–1.00)
GFR, Estimated: 30 mL/min — ABNORMAL LOW (ref >60)
Glucose, Bld: 108 mg/dL — ABNORMAL HIGH (ref 70–99)
Potassium: 4.8 mmol/L (ref 3.5–5.1)
Sodium: 139 mmol/L (ref 135–145)

## 2022-07-03 LAB — MAGNESIUM: Magnesium: 1.5 mg/dL — ABNORMAL LOW (ref 1.7–2.4)

## 2022-07-03 MED ORDER — "SYRINGE/NEEDLE (DISP) 25G X 5/8"" 3 ML MISC"
0 refills | Status: DC
  Filled 2022-07-03: qty 90, fill #0

## 2022-07-03 MED ORDER — LOPERAMIDE HCL 2 MG PO CAPS
2.0000 mg | ORAL_CAPSULE | Freq: Three times a day (TID) | ORAL | 0 refills | Status: DC | PRN
  Filled 2022-07-03: qty 30, 10d supply, fill #0

## 2022-07-03 MED ORDER — MAGNESIUM SULFATE 2 GM/50ML IV SOLN
2.0000 g | Freq: Once | INTRAVENOUS | Status: AC
  Administered 2022-07-03: 2 g via INTRAVENOUS
  Filled 2022-07-03: qty 50

## 2022-07-03 MED ORDER — OCTREOTIDE ACETATE 50 MCG/ML IJ SOLN
50.0000 ug | Freq: Three times a day (TID) | INTRAMUSCULAR | 0 refills | Status: DC
  Filled 2022-07-03: qty 5, 2d supply, fill #0

## 2022-07-03 MED ORDER — AMOXICILLIN-POT CLAVULANATE 875-125 MG PO TABS
1.0000 | ORAL_TABLET | Freq: Two times a day (BID) | ORAL | 0 refills | Status: AC
  Filled 2022-07-03: qty 6, 3d supply, fill #0

## 2022-07-03 NOTE — Consult Note (Signed)
[place] Nurse ostomy consult note Stoma type/location: RLQ ileostomy, contact dermatitis to periwound skin.  Has been out of supplies and not wearing pouch.  Has high output ileostomy and not taking antidiarrheals.  We discuss the risks of this.  Infection to skin, dehydration from high output stoma.  She is set up with [employer] and said the supplies they sent did not work.  I am assuming she had frequent leaks.  I am going to change her to a 1 piece convex with a belt today.  Stomal assessment/size: 1 3/4" slightly prolapsed.  Peristomal assessment: Red, weeping denuded skin to peristomal skin, extends 12 cm circumferentially.  It is less red and painful than when she was first admitted.  She had a 4 inch 2 piece pouch in place that was separating and had been reinforced with tape. She states she had taped this. I educate her to change pouch when it loosens and separates.  That taping only traps stool against skin.   Treatment options for stomal/peristomal skin: Cleansed gently.  Stoma powder and skin prep x 2.  I apply a 4x4 skin barrier sheet to peristomal skin to protect and promote healing and 1 piece flat pouch..  I instruct her that the pouch she will take home and use is a 1 piece convex pouch with a belt and I will order this for her to take home  She is grateful for the assistance.  I discuss our outpatient ostomy clinic and she states she would like to be seen after discharge for ongoing help.  Output liquid green stool Ostomy pouching: 1pc.flat (switching to convex with a belt) Education provided: See above Enrolled patient in [place] program: Yes previously  Supplies through [employer].  Will not follow at this time.  Please re-consult if needed.  Domenic Moras MSN, RN, FNP-BC CWON Wound, Ostomy, Continence Nurse Pager [phone]
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
## 2022-07-03 NOTE — Discharge Summary (Signed)
Discharge Summary  Cynthia [name] KPT:[id number] DOB: 1961/10/16  PCP: Denita Lung, MD  Admit date: 07/01/2022 Discharge date: 07/03/2022    Time spent: 48mns, more than 50% time spent on coordination of care.  Recommendations for Outpatient Follow-up:  F/u with PCP within a week  for hospital discharge follow up, repeat cbc/bmp at follow up.  F/u with ostomy clinic  Appreciate transitional care and transitional pharmacy assistance     Discharge Diagnoses:  Active Hospital Problems   Diagnosis Date Noted   Abdominal wall cellulitis 07/01/2022   CKD (chronic kidney disease) stage 3, GFR 30-59 ml/min (HCC) 04/13/2022    Resolved Hospital Problems  No resolved problems to display.    Discharge Condition: stable  Diet recommendation: regular diet   Filed Weights   07/01/22 1937 07/02/22 1632  Weight: 54.4 kg 56.4 kg    History of present illness: ( per admitting MD Dr OMyna Hidalgo Chief Complaint: Abdominal pain and redness    HPI: Cynthia MCCAIGis a 61y.o. female with medical history significant for Crohn's disease status post partial bowel resection, high output ostomy, and CKD 3B, now presenting to the emergency department with increasing abdominal wall redness and pain.   Patient reports that she has not been using an ostomy bag recently and has developed worsening redness and pain surrounding the stoma.  She denies fevers or chills.   ED Course: Upon arrival to the ED, patient is found to be afebrile and saturating well on room air with stable blood pressure.  Chemistry panel with creatinine 1.48.  CT abdomen pelvis negative for acute intra-abdominal or pelvic abnormality.  Blood cultures were collected in the ED and antibiotics were ordered.    Hospital Course:  Principal Problem:   Abdominal wall cellulitis Active Problems:   CKD (chronic kidney disease) stage 3, GFR 30-59 ml/min (HCC)   1. Abdominal wall cellulitis  - reports running out supplies  and meds recently presents with worsening pain and erythema surrounding her stoma where bowel contents have been draining   - She has dermatitis with possible superimposed infection but is not septic on admission  - cellulitis improved with Augmentin, continue for three days, ostomy RN input appreciated, she is feeling  better, discharge meds secured, she desires to go home and follow up with pcp and ostomy clinic    2. CKD IIIb  - SCr is 1.48 on admission, appears close to baseline  - Renally-dose medications, monitor     3. High output ostomy  - Continue octreotide , appreciate transitional pharmacy helping to get meds refilled for her   Discharge Exam: BP 117/79 (BP Location: Left Arm)   Pulse 82   Temp 98 F (36.7 C) (Oral)   Resp 16   Ht 5' 4"  (1.626 m)   Wt 56.4 kg   LMP 06/10/2011 (Exact Date)   SpO2 100%   BMI 21.34 kg/m   General: NAD , ambulating  Cardiovascular: RRR Respiratory: normal respiration     Discharge Instructions     Diet general   Complete by: As directed    Increase activity slowly   Complete by: As directed       Allergies as of 07/03/2022       Reactions   Nsaids Other (See Comments)   Crohn's disease & CKD   Ace Inhibitors Cough   Iodine Other (See Comments)   The patient said she had a reaction from iodine in some eye drops- caused eyes to  turn red;  pt has no reactions to CT contrast        Medication List     TAKE these medications    amoxicillin-clavulanate 875-125 MG tablet Commonly known as: AUGMENTIN Take 1 tablet by mouth every 12 (twelve) hours for 3 days.   loperamide 2 MG capsule Commonly known as: IMODIUM Take 1 capsule (2 mg total) by mouth 3 (three) times daily as needed for diarrhea or loose stools.   octreotide 50 MCG/ML Soln injection Commonly known as: SANDOSTATIN Inject 1 mL (50 mcg total) into the skin every 8 (eight) hours. What changed:  when to take this additional instructions   SYRINGE-NEEDLE  (DISP) 3 ML 25G X 5/8" 3 ML Misc Use as directed for octreotide injection       Allergies  Allergen Reactions   Nsaids Other (See Comments)    Crohn's disease & CKD   Ace Inhibitors Cough   Iodine Other (See Comments)    The patient said she had a reaction from iodine in some eye drops- caused eyes to turn red;  pt has no reactions to CT contrast    Follow-up Information     [facility] [place]. Schedule an appointment as soon as possible for a visit.   Specialty: General Surgery Contact information: [address] [id number] Danice Goltz [place] [address] [phone]        Denita Lung, MD Follow up.   Specialty: Family Medicine Why: hospital discharge follow up, repeat basic lab works including cbc/bmp at follow up, pcp to monitor kidney function . Contact information: [place] [address] [phone]                  The results of significant diagnostics from this hospitalization (including imaging, microbiology, ancillary and laboratory) are listed below for reference.    Significant Diagnostic Studies: CT ABDOMEN PELVIS W CONTRAST  Result Date: 07/01/2022 CLINICAL DATA:  Abdominal pain, history of Crohn's colitis with ostomy. EXAM: CT ABDOMEN AND PELVIS WITH CONTRAST TECHNIQUE: Multidetector CT imaging of the abdomen and pelvis was performed using the standard protocol following bolus administration of intravenous contrast. RADIATION DOSE REDUCTION: This exam was performed according to the departmental dose-optimization program which includes automated exposure control, adjustment of the mA and/or kV according to patient size and/or use of iterative reconstruction technique. CONTRAST:  2m OMNIPAQUE IOHEXOL 350 MG/ML SOLN COMPARISON:  04/13/2022 FINDINGS: Lower chest: No acute abnormality. Hepatobiliary: No focal liver abnormality is seen. No gallstones, gallbladder wall thickening, or biliary  dilatation. Pancreas: Unremarkable. No pancreatic ductal dilatation or surrounding inflammatory changes. Spleen: Normal in size without focal abnormality. Adrenals/Urinary Tract: Adrenal glands are within normal limits. Kidneys demonstrate a normal enhancement pattern bilaterally. No renal calculi or obstructive changes are noted. Delayed images demonstrate normal excretion. The bladder is partially distended. Stomach/Bowel: Hartmann's pouch is identified deep in the pelvis. Right-sided ileostomy is again identified. 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 No obstructive changes are seen. Stomach is within limits. Vascular/Lymphatic: Aortic atherosclerosis. No enlarged abdominal or pelvic lymph nodes. Reproductive: Status post hysterectomy. No adnexal masses. Other: No abdominal wall hernia or abnormality. No abdominopelvic ascites. Musculoskeletal: No acute or significant osseous findings. IMPRESSION: Postsurgical changes are noted consistent with right-sided ileostomy and left-sided mucous fistula. No obstructive changes are noted. No other focal abnormality is noted. No significant interval change from the prior study is seen. Electronically Signed   By: MInez CatalinaM.D.   On: 07/01/2022  22:18    Microbiology: Recent Results (from the past 240 hour(s))  Blood culture (routine x 2)     Status: None (Preliminary result)   Collection Time: 07/01/22  8:48 PM   Specimen: BLOOD RIGHT HAND  Result Value Ref Range Status   Specimen Description BLOOD RIGHT HAND  Final   Special Requests   Final    BOTTLES DRAWN AEROBIC AND ANAEROBIC Blood Culture results may not be optimal due to an inadequate volume of blood received in culture bottles   Culture   Final    NO GROWTH 2 DAYS Performed at [facility], [place] [address] [place], [place] [address]    Report Status PENDING  Incomplete     Labs: Basic Metabolic Panel: Recent Labs  Lab 07/01/22 2048 07/02/22 0138 07/03/22 0124  NA 141  140 139  K 4.1 3.9 4.8  CL 116* 111 112*  CO2 17* 19* 21*  GLUCOSE 83 155* 108*  BUN 32* 28* 31*  CREATININE 1.48* 1.48* 1.88*  CALCIUM 9.2 8.9 9.1  MG  --   --  1.5*   Liver Function Tests: Recent Labs  Lab 07/01/22 2048  AST 19  ALT 15  ALKPHOS 62  BILITOT 0.3  PROT 6.5  ALBUMIN 3.4*   Recent Labs  Lab 07/01/22 2048  LIPASE 39   No results for input(s): "AMMONIA" in the last 168 hours. CBC: Recent Labs  Lab 07/01/22 2048 07/02/22 0138  WBC 9.6 9.3  NEUTROABS 6.6  --   HGB 12.6 12.1  HCT 40.8 39.4  MCV 98.3 98.7  PLT 250 260   Cardiac Enzymes: No results for input(s): "CKTOTAL", "CKMB", "CKMBINDEX", "TROPONINI" in the last 168 hours. BNP: BNP (last 3 results) Recent Labs    10/31/21 1404 12/25/21 0439  BNP 369.2* 314.1*    ProBNP (last 3 results) No results for input(s): "PROBNP" in the last 8760 hours.  CBG: No results for input(s): "GLUCAP" in the last 168 hours.  FURTHER DISCHARGE INSTRUCTIONS:   Get Medicines reviewed and adjusted: Please take all your medications with you for your next visit with your Primary MD   Laboratory/radiological data: Please request your Primary MD to go over all hospital tests and procedure/radiological results at the follow up, please ask your Primary MD to get all Hospital records sent to his/her office.   In some cases, they will be blood work, cultures and biopsy results pending at the time of your discharge. Please request that your primary care M.D. goes through all the records of your hospital data and follows up on these results.   Also Note the following: If you experience worsening of your admission symptoms, develop shortness of breath, life threatening emergency, suicidal or homicidal thoughts you must seek medical attention immediately by calling 911 or calling your MD immediately  if symptoms less severe.   You must read complete instructions/literature along with all the possible adverse  reactions/side effects for all the Medicines you take and that have been prescribed to you. Take any new Medicines after you have completely understood and accpet all the possible adverse reactions/side effects.    Do not drive when taking Pain medications or sleeping medications (Benzodaizepines)   Do not take more than prescribed Pain, Sleep and Anxiety Medications. It is not advisable to combine anxiety,sleep and pain medications without talking with your primary care practitioner   Special Instructions: If you have smoked or chewed Tobacco  in the last 2 yrs please stop smoking,  stop any regular Alcohol  and or any Recreational drug use.   Wear Seat belts while driving.   Please note: You were cared for by a hospitalist during your hospital stay. Once you are discharged, your primary care physician will handle any further medical issues. Please note that NO REFILLS for any discharge medications will be authorized once you are discharged, as it is imperative that you return to your primary care physician (or establish a relationship with a primary care physician if you do not have one) for your post hospital discharge needs so that they can reassess your need for medications and monitor your lab values.     Signed:  Florencia Reasons MD, PhD, FACP  [facility] 07/03/2022, 4:05 PM

## 2022-07-03 NOTE — Progress Notes (Signed)
Mobility Specialist - Progress Note   07/03/22 1500  Mobility  Activity Ambulated independently in hallway  Level of Assistance Independent  Assistive Device None  Distance Ambulated (ft) 550 ft  Activity Response Tolerated well  $Mobility charge 1 Mobility    Pt received in bed agreeable to mobility. Left EOB w/ call bell in reach and all needs met.     Mobility Specialist  

## 2022-07-03 NOTE — TOC Initial Note (Addendum)
Transition of Care [facility]) - Initial/Assessment Note    Patient Details  Name: Cynthia [name] MRN: [id number] Date of Birth: Mar 26, 1961  Transition of Care [facility]) CM/SW Contact:    Marilu Favre, RN Phone Number: 07/03/2022, 11:34 AM  Clinical Narrative:                 Spoke to patient and significant other at bedside. Confirmed address and phone number.   PCP DR Jill Alexanders   Confirmed with patient she does have running water and electricity at home.    She has run out of supplies for ileostomy.   See WOC note.   WOC has ordered home supplies .  Nurse will provide some supplies at discharge.   WOC recommends patient follow up at ostomy clinic, patient in agreement.   NCM secure chatted MD for signature for referral form . Referral form on chart.   Patient has insurance , however does not have prescription coverage. Patient not candidate for MATCH. Explained can ask MD to send  prescriptions to [facility]. [facility] will let patient know cost. She can pay by card, cash or be billed.    Patient may have a ride home, if not TOC can assist.   Patient voiced understanding and agreement to all of above   1520 Referral for ostomy clinic was signed by MD. NCM faxed to [facility] at [phone]   Expected Discharge Plan: Home/Self Care Barriers to Discharge: Continued Medical Work up   Patient Goals and CMS Choice Patient states their goals for this hospitalization and ongoing recovery are:: to return to home      Expected Discharge Plan and Services Expected Discharge Plan: Home/Self Care   Discharge Planning Services: CM Consult   Living arrangements for the past 2 months: Single Family Home                 DME Arranged: N/A         HH Arranged: NA          Prior Living Arrangements/Services Living arrangements for the past 2 months: Single Family Home Lives with:: Significant Other Patient language and need for interpreter reviewed::  Yes Do you feel safe going back to the place where you live?: Yes      Need for Family Participation in Patient Care: Yes (Comment) Care giver support system in place?: Yes (comment)   Criminal Activity/Legal Involvement Pertinent to Current Situation/Hospitalization: No - Comment as needed  Activities of Daily Living Home Assistive Devices/Equipment: None ADL Screening (condition at time of admission) Patient's cognitive ability adequate to safely complete daily activities?: Yes Is the patient deaf or have difficulty hearing?: No Does the patient have difficulty seeing, even when wearing glasses/contacts?: No Does the patient have difficulty concentrating, remembering, or making decisions?: No Patient able to express need for assistance with ADLs?: No Does the patient have difficulty dressing or bathing?: No Independently performs ADLs?: Yes (appropriate for developmental age) Does the patient have difficulty walking or climbing stairs?: No Weakness of Legs: None Weakness of Arms/Hands: None  Permission Sought/Granted   Permission granted to share information with : Yes, Verbal Permission Granted  Share Information with NAME: significant other           Emotional Assessment Appearance:: Appears stated age Attitude/Demeanor/Rapport: Engaged Affect (typically observed): Accepting Orientation: : Oriented to Self, Oriented to Place, Oriented to  Time, Oriented to Situation Alcohol / Substance Use: Not Applicable Psych Involvement: No (comment)  Admission diagnosis:  Cellulitis of abdominal wall [L03.311] Abdominal wall cellulitis [L03.311] Patient Active Problem List   Diagnosis Date Noted   Abdominal wall cellulitis 07/01/2022   AKI (acute kidney injury) ([place]) 04/14/2022   History of noncompliance with medical treatment 04/13/2022   CKD (chronic kidney disease) stage 3, GFR 30-59 ml/min ([place]) 04/13/2022   Anxiety 04/13/2022   Irritant contact dermatitis associated with  fecal stoma 04/13/2022   Encounter for ostomy nurse consultation 04/13/2022   Hypoglycemia 12/25/2021   Malnutrition of moderate degree 12/23/2021   Anemia 10/24/2021   Vitamin B deficiency, unspecified 10/24/2021   Indigestion 10/24/2021   High output ileostomy ([place]) 09/26/2021   Acute renal failure (ARF) ([place]) 08/12/2021   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[place])    Protein-calorie malnutrition, severe 07/11/2021   Hypotension 07/10/2021   High anion gap metabolic acidosis 02/63/7858   Acute renal failure, unspecified acute renal failure type ([place]) 07/10/2021   Shock circulatory (HCC)    Nausea and vomiting 06/26/2021   Abdominal pain 06/26/2021   Sepsis ([place]) 06/26/2021   Hyperkalemia 04/05/2021   Hyponatremia 04/05/2021   Failure to thrive in adult 03/20/2021   Adjustment disorder 03/14/2021   Unspecified severe protein-calorie malnutrition ([place]) 03/13/2021   COVID-19 01/25/2021   Leukocytosis 01/24/2021   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   History of rectal polyp 2020 01/12/2021   Anxiety associated with depression 01/12/2021   Hypokalemia 12/04/2020   Cough 11/27/2020   Otalgia of both ears 11/27/2020   Body aches 11/27/2020   Fever 11/27/2020   High risk medication use 11/27/2020   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 [place] [place]Phone: [phone]: [phone]    Social Determinants of Health (SDOH) Interventions    Readmission Risk Interventions    08/20/2021    1:07 PM 04/09/2021    9:32 AM  Readmission Risk Prevention Plan  Transportation Screening Complete Complete  Medication Review ([place] Complete Complete  PCP or Specialist appointment within 3-5 days of discharge Complete Complete  HRI or Home Care Consult Complete Complete  SW Recovery Care/Counseling Consult Complete Complete  Palliative Care Screening Not Applicable [place] Not Applicable

## 2022-07-04 ENCOUNTER — Other Ambulatory Visit (HOSPITAL_COMMUNITY): Payer: Self-pay

## 2022-07-04 MED ORDER — OCTREOTIDE ACETATE 50 MCG/ML ~~LOC~~ SOSY
50.0000 ug | PREFILLED_SYRINGE | SUBCUTANEOUS | 0 refills | Status: DC
  Filled 2022-07-04: qty 85, 28d supply, fill #0

## 2022-07-06 LAB — CULTURE, BLOOD (ROUTINE X 2): Culture: NO GROWTH

## 2022-07-07 ENCOUNTER — Other Ambulatory Visit (HOSPITAL_COMMUNITY): Payer: Self-pay

## 2022-07-08 ENCOUNTER — Telehealth: Payer: Self-pay

## 2022-07-08 ENCOUNTER — Encounter: Payer: Self-pay | Admitting: Family Medicine

## 2022-07-08 ENCOUNTER — Other Ambulatory Visit (HOSPITAL_COMMUNITY): Payer: Self-pay

## 2022-07-08 NOTE — Telephone Encounter (Signed)
Transition Care Management Follow-up Telephone Call Date of discharge and from where: Cynthia [name] 07/03/22 How have you been since you were released from the hospital? [place] Any questions or concerns? No  Items Reviewed: Did the pt receive and understand the discharge instructions provided? Yes  Medications obtained and verified? Yes  Other? No  Any new allergies since your discharge? No  Dietary orders reviewed? Yes Do you have support at home? No   Home Care and Equipment/Supplies: Were home health services ordered? no   Follow up appointments reviewed:  PCP Hospital f/u appt confirmed? No  pt. Was unable to schedule due to lack of transportation will call back to schedule hospital f/u  [place] [facility] f/u appt confirmed? No   Are transportation arrangements needed? Yes  If their condition worsens, is the pt aware to call PCP or go to the Emergency Dept.? Yes Was the patient provided with contact information for the PCP's office or ED? Yes Was to pt encouraged to call back with questions or concerns? Yes

## 2022-07-09 ENCOUNTER — Other Ambulatory Visit (HOSPITAL_COMMUNITY): Payer: Self-pay

## 2022-07-09 ENCOUNTER — Other Ambulatory Visit: Payer: Self-pay

## 2022-07-30 ENCOUNTER — Telehealth: Payer: Self-pay | Admitting: Family Medicine

## 2022-07-30 MED ORDER — OCTREOTIDE ACETATE 50 MCG/ML ~~LOC~~ SOSY: 50.0000 ug | PREFILLED_SYRINGE | SUBCUTANEOUS | 5 refills | Status: DC

## 2022-07-30 NOTE — Telephone Encounter (Signed)
Pt called and states that she needs a refill for her octreotide acetate please send to the  [id number] [place] [place]

## 2022-08-06 ENCOUNTER — Telehealth: Payer: Self-pay | Admitting: Family Medicine

## 2022-08-06 ENCOUNTER — Inpatient Hospital Stay: Payer: Medicare Other | Admitting: Family Medicine

## 2022-08-06 NOTE — Telephone Encounter (Signed)
Scheduled for follow up appt on 10/13, (first opening you had)  I did tell her that she needed to keep this appointment  She is asking for a refill on her injections

## 2022-08-07 ENCOUNTER — Encounter: Payer: Self-pay | Admitting: Family Medicine

## 2022-08-12 ENCOUNTER — Encounter: Payer: Self-pay | Admitting: Internal Medicine

## 2022-08-13 ENCOUNTER — Ambulatory Visit: Payer: Medicare Other | Admitting: Family Medicine

## 2022-08-15 ENCOUNTER — Ambulatory Visit: Payer: Medicare Other | Admitting: Family Medicine

## 2022-08-19 ENCOUNTER — Ambulatory Visit: Payer: Medicare Other | Admitting: Family Medicine

## 2022-08-25 ENCOUNTER — Encounter: Payer: Self-pay | Admitting: Internal Medicine

## 2022-09-03 ENCOUNTER — Other Ambulatory Visit: Payer: Self-pay | Admitting: Family Medicine

## 2022-09-03 ENCOUNTER — Encounter: Payer: Self-pay | Admitting: *Deleted

## 2022-09-03 NOTE — Telephone Encounter (Signed)
Is this okay to refill? 

## 2022-11-04 ENCOUNTER — Other Ambulatory Visit: Payer: Self-pay | Admitting: Family Medicine

## 2022-11-05 DIAGNOSIS — R198 Other specified symptoms and signs involving the digestive system and abdomen: Secondary | ICD-10-CM | POA: Diagnosis not present

## 2022-11-05 DIAGNOSIS — K50812 Crohn's disease of both small and large intestine with intestinal obstruction: Secondary | ICD-10-CM | POA: Diagnosis not present

## 2022-11-05 DIAGNOSIS — Z932 Ileostomy status: Secondary | ICD-10-CM | POA: Diagnosis not present

## 2022-11-05 NOTE — Telephone Encounter (Signed)
Veronica sent [employer] to pt and tried to call pt to schedule a visit

## 2022-11-06 ENCOUNTER — Other Ambulatory Visit: Payer: Self-pay | Admitting: Family Medicine

## 2022-11-14 ENCOUNTER — Other Ambulatory Visit (HOSPITAL_COMMUNITY): Payer: Self-pay

## 2022-12-04 DIAGNOSIS — K50013 Crohn's disease of small intestine with fistula: Secondary | ICD-10-CM | POA: Diagnosis not present

## 2022-12-04 DIAGNOSIS — R197 Diarrhea, unspecified: Secondary | ICD-10-CM | POA: Diagnosis not present

## 2022-12-08 DIAGNOSIS — Z932 Ileostomy status: Secondary | ICD-10-CM | POA: Diagnosis not present

## 2022-12-08 DIAGNOSIS — Z9049 Acquired absence of other specified parts of digestive tract: Secondary | ICD-10-CM | POA: Diagnosis not present

## 2022-12-08 DIAGNOSIS — N200 Calculus of kidney: Secondary | ICD-10-CM | POA: Diagnosis not present

## 2022-12-08 DIAGNOSIS — K5090 Crohn's disease, unspecified, without complications: Secondary | ICD-10-CM | POA: Diagnosis not present

## 2022-12-12 DIAGNOSIS — K50919 Crohn's disease, unspecified, with unspecified complications: Secondary | ICD-10-CM | POA: Diagnosis not present

## 2023-02-06 DIAGNOSIS — K50919 Crohn's disease, unspecified, with unspecified complications: Secondary | ICD-10-CM | POA: Diagnosis not present

## 2023-06-15 IMAGING — CT CT ABD-PELV W/O CM
2 of 4 series · 16 of 46 positions shown, 18 images · non-contrast
Comparison: 10/24/2021

CLINICAL DATA: Right abdominal pain bowel obstruction.



[Series 2: axial st · axial · 0.92mm/px · z∈[-414,-69]mm · 13 of 79 slices shown, 15 images]
[im 5/79  soft-tissue]
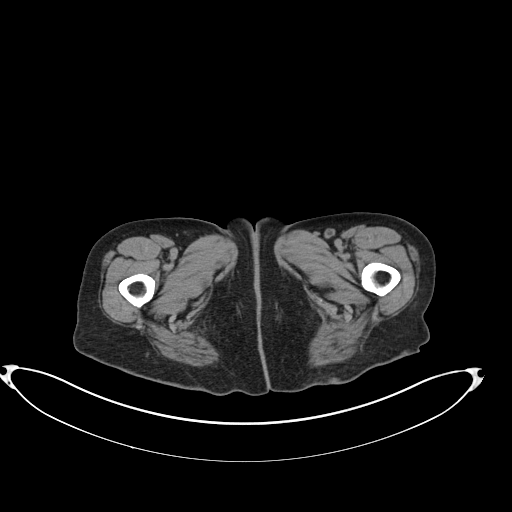
[im 5/79  bone]
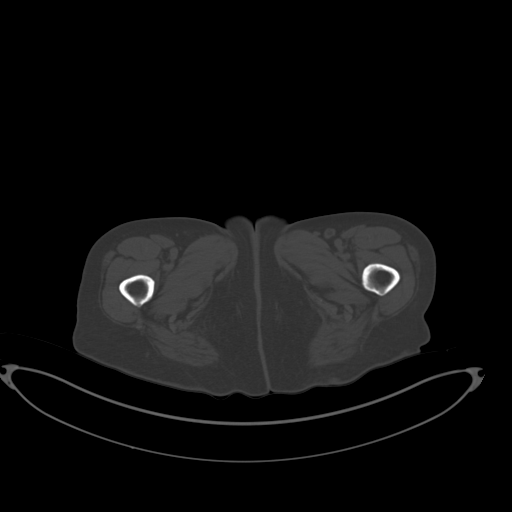
[im 9/79  soft-tissue]
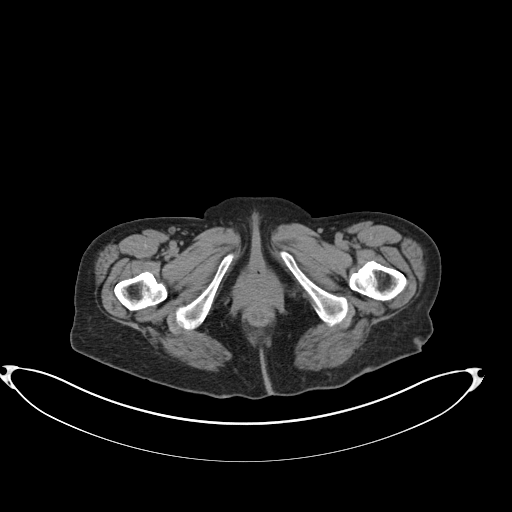
[im 18/79  soft-tissue]
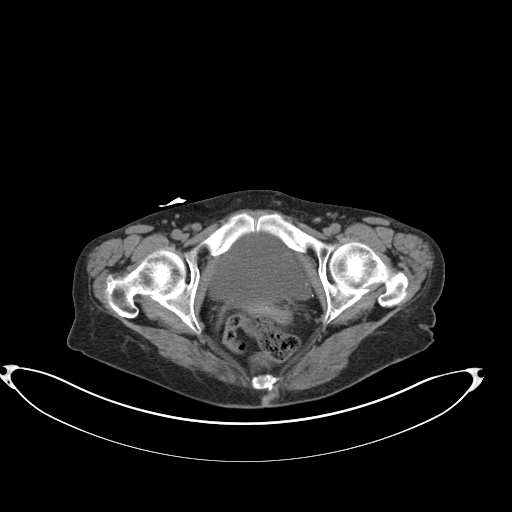
[im 22/79  soft-tissue]
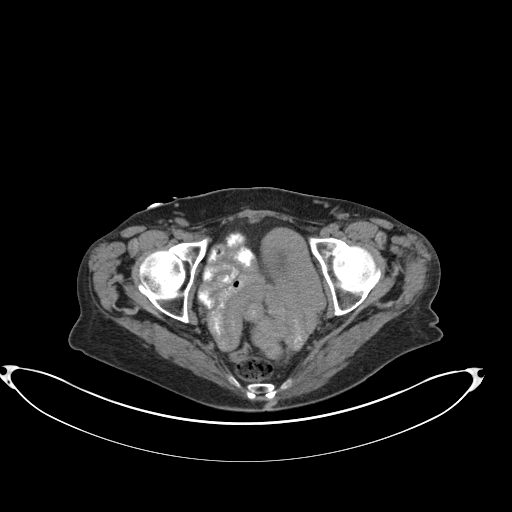
[im 27/79  soft-tissue]
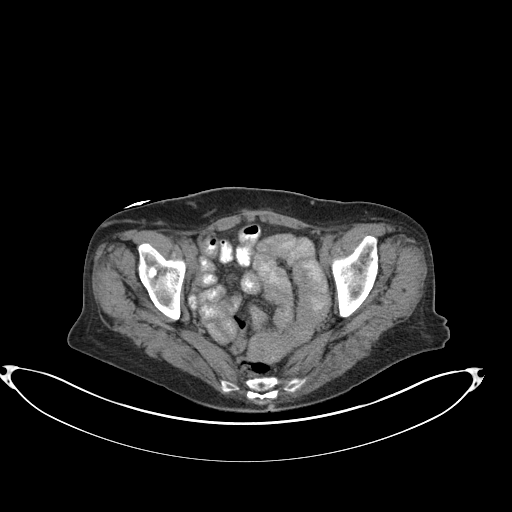
[im 35/79  soft-tissue]
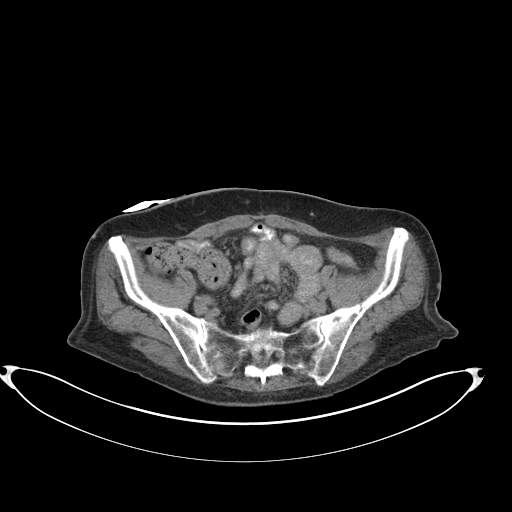
[im 40/79  soft-tissue]
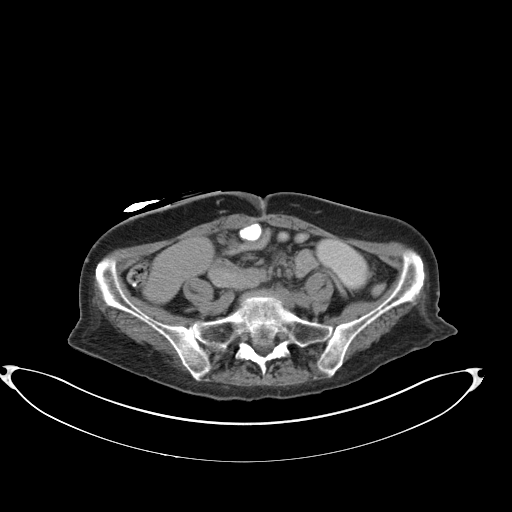
[im 44/79  soft-tissue]
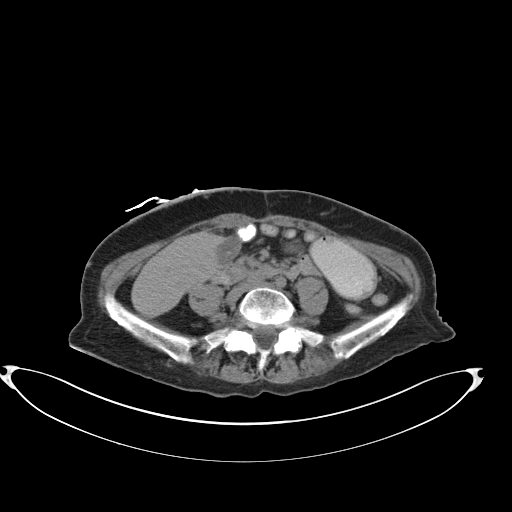
[im 53/79  soft-tissue]
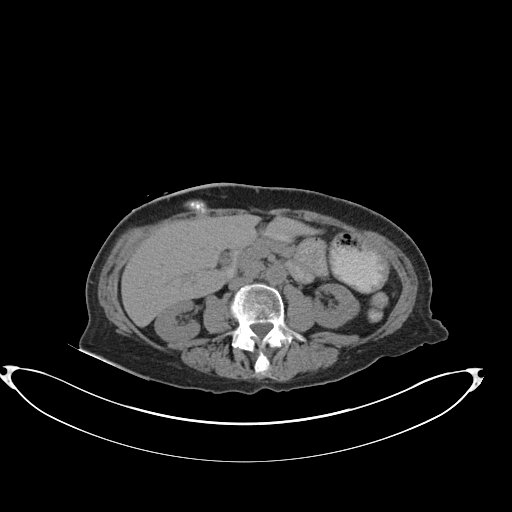
[im 53/79  bone]
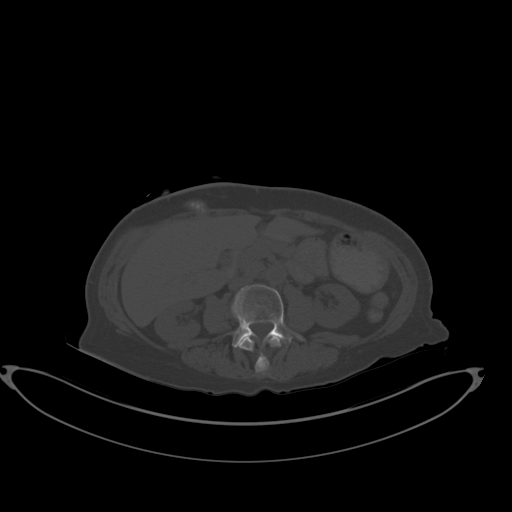
[im 57/79  soft-tissue]
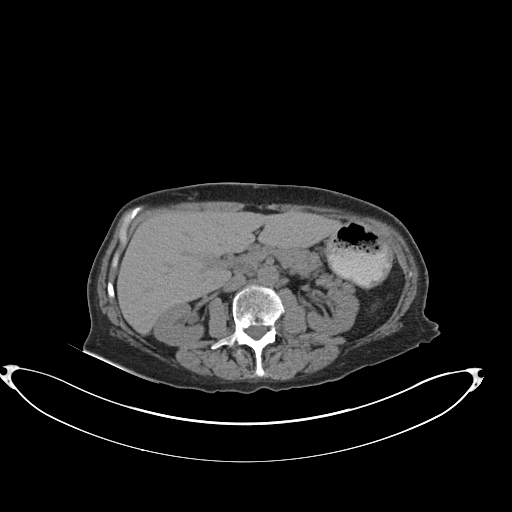
[im 61/79  soft-tissue]
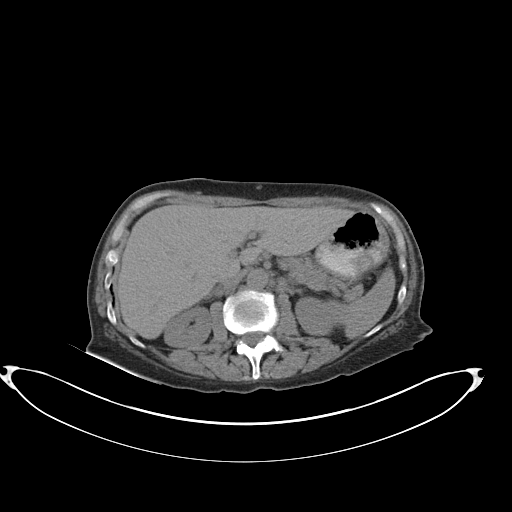
[im 70/79  soft-tissue]
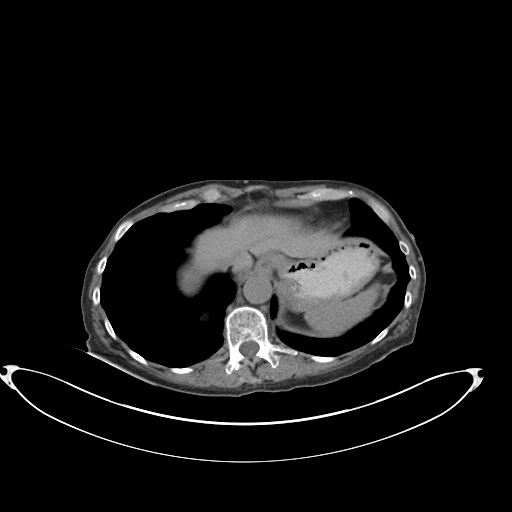
[im 74/79  soft-tissue]
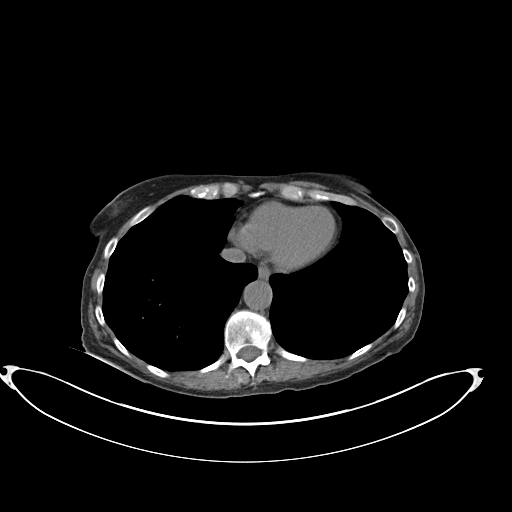

[Series 4: coronal st · coronal · 0.71mm/px · 3 of 119 slices shown]
[im 40/119  soft-tissue]
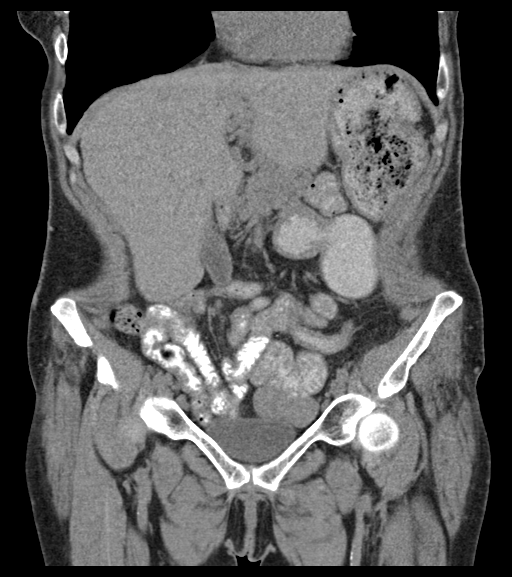
[im 53/119  soft-tissue]
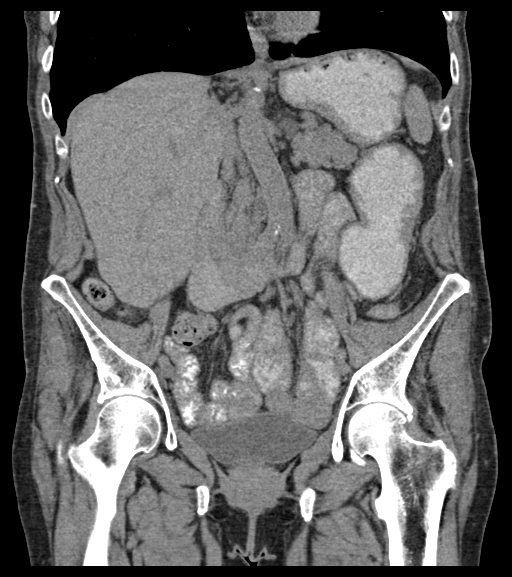
[im 66/119  soft-tissue]
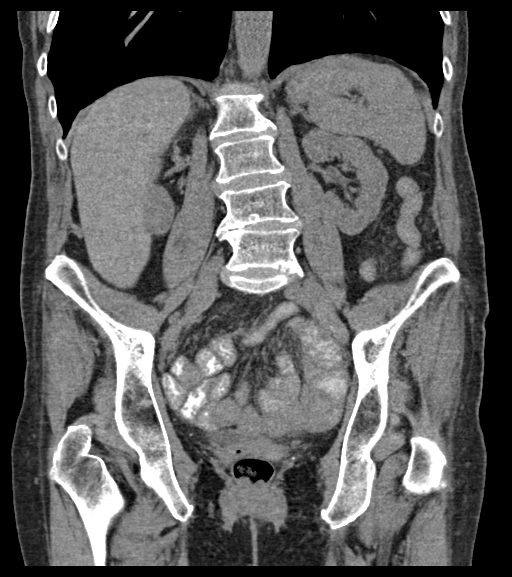

[16 of 46 positions shown; findings below may reference images not displayed]

FINDINGS: Lower chest: No acute abnormality.

Hepatobiliary: Cholelithiasis without pericholecystic inflammatory
change identified. Liver unremarkable. No intra or extrahepatic
biliary ductal dilation.

Pancreas: Unremarkable

Spleen: Unremarkable

Adrenals/Urinary Tract: Adrenal glands are unremarkable. Kidneys are
normal, without renal calculi, focal lesion, or hydronephrosis.
Bladder is unremarkable.

Stomach/Bowel: Subtotal colectomy with staple lines noted within the
mid ascending colon and mid sigmoid colon is identified with right
lower quadrant diverting ileostomy again identified. The stomach,
small bowel, and residual large bowel are unremarkable. No free
intraperitoneal gas or fluid. No evidence of obstruction or focal
inflammation.

Vascular/Lymphatic: Aortic atherosclerosis. No enlarged abdominal or
pelvic lymph nodes.

Reproductive: Status post hysterectomy. No adnexal masses.

Other: No abdominal wall hernia.

Musculoskeletal: Degenerative changes are seen within the lumbar
spine. No lytic or blastic bone lesion.
IMPRESSION: No acute intra-abdominal pathology identified. No definite
radiographic explanation for the patient's reported symptoms.

Status post subtotal colectomy with right lower quadrant diverting
ileostomy.

Cholelithiasis.

Aortic Atherosclerosis ([id number]).

## 2023-09-02 DIAGNOSIS — K50919 Crohn's disease, unspecified, with unspecified complications: Secondary | ICD-10-CM | POA: Diagnosis not present

## 2023-09-02 DIAGNOSIS — K50013 Crohn's disease of small intestine with fistula: Secondary | ICD-10-CM | POA: Diagnosis not present

## 2023-09-02 DIAGNOSIS — R197 Diarrhea, unspecified: Secondary | ICD-10-CM | POA: Diagnosis not present

## 2023-09-09 DIAGNOSIS — K50919 Crohn's disease, unspecified, with unspecified complications: Secondary | ICD-10-CM | POA: Diagnosis not present

## 2023-09-09 DIAGNOSIS — D84821 Immunodeficiency due to drugs: Secondary | ICD-10-CM | POA: Diagnosis not present

## 2023-09-09 DIAGNOSIS — K50112 Crohn's disease of large intestine with intestinal obstruction: Secondary | ICD-10-CM | POA: Diagnosis not present

## 2023-09-09 DIAGNOSIS — K50013 Crohn's disease of small intestine with fistula: Secondary | ICD-10-CM | POA: Diagnosis not present

## 2023-09-09 DIAGNOSIS — E440 Moderate protein-calorie malnutrition: Secondary | ICD-10-CM | POA: Diagnosis not present

## 2023-09-09 DIAGNOSIS — Z79899 Other long term (current) drug therapy: Secondary | ICD-10-CM | POA: Diagnosis not present

## 2023-09-29 ENCOUNTER — Other Ambulatory Visit (INDEPENDENT_AMBULATORY_CARE_PROVIDER_SITE_OTHER): Payer: Medicare Other

## 2023-09-29 ENCOUNTER — Ambulatory Visit (INDEPENDENT_AMBULATORY_CARE_PROVIDER_SITE_OTHER): Payer: Medicare Other | Admitting: Family Medicine

## 2023-09-29 ENCOUNTER — Encounter: Payer: Self-pay | Admitting: Family Medicine

## 2023-09-29 VITALS — BP 138/90 | HR 76 | Temp 97.7°F | Ht 64.0 in | Wt 127.8 lb

## 2023-09-29 DIAGNOSIS — Z932 Ileostomy status: Secondary | ICD-10-CM | POA: Diagnosis not present

## 2023-09-29 DIAGNOSIS — K50013 Crohn's disease of small intestine with fistula: Secondary | ICD-10-CM | POA: Diagnosis not present

## 2023-09-29 DIAGNOSIS — J069 Acute upper respiratory infection, unspecified: Secondary | ICD-10-CM | POA: Diagnosis not present

## 2023-09-29 DIAGNOSIS — R051 Acute cough: Secondary | ICD-10-CM

## 2023-09-29 DIAGNOSIS — Z87891 Personal history of nicotine dependence: Secondary | ICD-10-CM

## 2023-09-29 LAB — POC COVID19 BINAXNOW: SARS Coronavirus 2 Ag: NEGATIVE

## 2023-09-29 NOTE — Progress Notes (Signed)
   Subjective:    Patient ID: Cynthia [name], female    DOB: August 28, 1961, 62 y.o.   MRN: [id number]  HPI She is here for consult concerning a several day history of cough, congestion, sore throat.  She did quit smoking recently.  She does have Crohn's disease and is on shots for control of this.  She is scheduled for a colostomy reversal in the near future.  Her life in general has improved a lot.   Review of Systems     Objective:    Physical Exam Alert and in no distress. Tympanic membranes and canals are normal. Pharyngeal area is normal. Neck is supple without adenopathy or thyromegaly. Cardiac exam shows a regular sinus rhythm without murmurs or gallops. Lungs are clear to auscultation. 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  She will she continues have difficulty with this.  Congratulated her on quitting smoking.  I will do routine blood screening to make sure I do not see anything of significance.

## 2023-09-30 LAB — CBC WITH DIFFERENTIAL/PLATELET
Basophils Absolute: 0 10*3/uL (ref 0.0–0.2)
Basos: 0 %
EOS (ABSOLUTE): 0.2 10*3/uL (ref 0.0–0.4)
Eos: 2 %
Hematocrit: 40.3 % (ref 34.0–46.6)
Hemoglobin: 12.9 g/dL (ref 11.1–15.9)
Immature Grans (Abs): 0.1 10*3/uL (ref 0.0–0.1)
Immature Granulocytes: 1 %
Lymphocytes Absolute: 1.5 10*3/uL (ref 0.7–3.1)
Lymphs: 11 %
MCH: 30 pg (ref 26.6–33.0)
MCHC: 32 g/dL (ref 31.5–35.7)
MCV: 94 fL (ref 79–97)
Monocytes Absolute: 0.8 10*3/uL (ref 0.1–0.9)
Monocytes: 6 %
Neutrophils Absolute: 11 10*3/uL — ABNORMAL HIGH (ref 1.4–7.0)
Neutrophils: 80 %
Platelets: 313 10*3/uL (ref 150–450)
RBC: 4.3 x10E6/uL (ref 3.77–5.28)
RDW: 11.8 % (ref 11.7–15.4)
WBC: 13.7 10*3/uL — ABNORMAL HIGH (ref 3.4–10.8)

## 2023-09-30 LAB — COMPREHENSIVE METABOLIC PANEL
ALT: 8 [IU]/L (ref 0–32)
AST: 11 [IU]/L (ref 0–40)
Albumin: 4.2 g/dL (ref 3.9–4.9)
Alkaline Phosphatase: 89 [IU]/L (ref 44–121)
BUN/Creatinine Ratio: 12 (ref 12–28)
BUN: 25 mg/dL (ref 8–27)
Bilirubin Total: <0.2 mg/dL (ref 0.0–1.2)
CO2: 17 mmol/L — ABNORMAL LOW (ref 20–29)
Calcium: 9.1 mg/dL (ref 8.7–10.3)
Chloride: 109 mmol/L — ABNORMAL HIGH (ref 96–106)
Creatinine, Ser: 2.08 mg/dL — ABNORMAL HIGH (ref 0.57–1.00)
Globulin, Total: 3.2 g/dL (ref 1.5–4.5)
Glucose: 84 mg/dL (ref 70–99)
Potassium: 4.5 mmol/L (ref 3.5–5.2)
Sodium: 143 mmol/L (ref 134–144)
Total Protein: 7.4 g/dL (ref 6.0–8.5)
eGFR: 26 mL/min/{1.73_m2} — ABNORMAL LOW (ref >59)

## 2023-10-09 ENCOUNTER — Inpatient Hospital Stay (HOSPITAL_COMMUNITY)
Admission: EM | Admit: 2023-10-09 | Discharge: 2023-10-15 | DRG: 872 | Disposition: A | Discharge status: Home / Self Care | Payer: Medicare Other | Attending: Internal Medicine | Admitting: Internal Medicine

## 2023-10-09 ENCOUNTER — Emergency Department (HOSPITAL_COMMUNITY): Payer: Medicare Other

## 2023-10-09 ENCOUNTER — Other Ambulatory Visit: Payer: Self-pay

## 2023-10-09 DIAGNOSIS — G2581 Restless legs syndrome: Secondary | ICD-10-CM | POA: Diagnosis not present

## 2023-10-09 DIAGNOSIS — N1832 Chronic kidney disease, stage 3b: Secondary | ICD-10-CM | POA: Diagnosis present

## 2023-10-09 DIAGNOSIS — I10 Essential (primary) hypertension: Secondary | ICD-10-CM | POA: Diagnosis present

## 2023-10-09 DIAGNOSIS — N200 Calculus of kidney: Secondary | ICD-10-CM

## 2023-10-09 DIAGNOSIS — N136 Pyonephrosis: Secondary | ICD-10-CM | POA: Diagnosis not present

## 2023-10-09 DIAGNOSIS — K8020 Calculus of gallbladder without cholecystitis without obstruction: Secondary | ICD-10-CM | POA: Diagnosis not present

## 2023-10-09 DIAGNOSIS — Z87891 Personal history of nicotine dependence: Secondary | ICD-10-CM

## 2023-10-09 DIAGNOSIS — K219 Gastro-esophageal reflux disease without esophagitis: Secondary | ICD-10-CM | POA: Diagnosis present

## 2023-10-09 DIAGNOSIS — F418 Other specified anxiety disorders: Secondary | ICD-10-CM | POA: Diagnosis present

## 2023-10-09 DIAGNOSIS — Z832 Family history of diseases of the blood and blood-forming organs and certain disorders involving the immune mechanism: Secondary | ICD-10-CM | POA: Diagnosis not present

## 2023-10-09 DIAGNOSIS — Z888 Allergy status to other drugs, medicaments and biological substances status: Secondary | ICD-10-CM

## 2023-10-09 DIAGNOSIS — N10 Acute tubulo-interstitial nephritis: Secondary | ICD-10-CM

## 2023-10-09 DIAGNOSIS — R109 Unspecified abdominal pain: Secondary | ICD-10-CM | POA: Diagnosis not present

## 2023-10-09 DIAGNOSIS — Z833 Family history of diabetes mellitus: Secondary | ICD-10-CM | POA: Diagnosis not present

## 2023-10-09 DIAGNOSIS — R799 Abnormal finding of blood chemistry, unspecified: Secondary | ICD-10-CM | POA: Diagnosis not present

## 2023-10-09 DIAGNOSIS — N390 Urinary tract infection, site not specified: Secondary | ICD-10-CM | POA: Diagnosis not present

## 2023-10-09 DIAGNOSIS — Z860100 Personal history of colon polyps, unspecified: Secondary | ICD-10-CM | POA: Diagnosis not present

## 2023-10-09 DIAGNOSIS — N201 Calculus of ureter: Secondary | ICD-10-CM | POA: Diagnosis not present

## 2023-10-09 DIAGNOSIS — Z886 Allergy status to analgesic agent status: Secondary | ICD-10-CM | POA: Diagnosis not present

## 2023-10-09 DIAGNOSIS — I499 Cardiac arrhythmia, unspecified: Secondary | ICD-10-CM | POA: Diagnosis not present

## 2023-10-09 DIAGNOSIS — Z5986 Financial insecurity: Secondary | ICD-10-CM | POA: Diagnosis not present

## 2023-10-09 DIAGNOSIS — N3001 Acute cystitis with hematuria: Secondary | ICD-10-CM

## 2023-10-09 DIAGNOSIS — K828 Other specified diseases of gallbladder: Secondary | ICD-10-CM | POA: Diagnosis not present

## 2023-10-09 DIAGNOSIS — Z5912 Inadequate housing utilities: Secondary | ICD-10-CM | POA: Diagnosis not present

## 2023-10-09 DIAGNOSIS — N132 Hydronephrosis with renal and ureteral calculous obstruction: Secondary | ICD-10-CM | POA: Diagnosis not present

## 2023-10-09 DIAGNOSIS — A419 Sepsis, unspecified organism: Secondary | ICD-10-CM | POA: Diagnosis not present

## 2023-10-09 DIAGNOSIS — Z87440 Personal history of urinary (tract) infections: Secondary | ICD-10-CM

## 2023-10-09 DIAGNOSIS — Z90710 Acquired absence of both cervix and uterus: Secondary | ICD-10-CM

## 2023-10-09 DIAGNOSIS — Z932 Ileostomy status: Secondary | ICD-10-CM

## 2023-10-09 DIAGNOSIS — R0689 Other abnormalities of breathing: Secondary | ICD-10-CM | POA: Diagnosis not present

## 2023-10-09 DIAGNOSIS — N179 Acute kidney failure, unspecified: Secondary | ICD-10-CM | POA: Diagnosis not present

## 2023-10-09 DIAGNOSIS — R6520 Severe sepsis without septic shock: Secondary | ICD-10-CM | POA: Diagnosis not present

## 2023-10-09 DIAGNOSIS — R6510 Systemic inflammatory response syndrome (SIRS) of non-infectious origin without acute organ dysfunction: Principal | ICD-10-CM

## 2023-10-09 DIAGNOSIS — R6889 Other general symptoms and signs: Secondary | ICD-10-CM | POA: Diagnosis not present

## 2023-10-09 DIAGNOSIS — K50919 Crohn's disease, unspecified, with unspecified complications: Secondary | ICD-10-CM

## 2023-10-09 DIAGNOSIS — R1084 Generalized abdominal pain: Secondary | ICD-10-CM | POA: Diagnosis not present

## 2023-10-09 DIAGNOSIS — E872 Acidosis, unspecified: Secondary | ICD-10-CM | POA: Diagnosis not present

## 2023-10-09 DIAGNOSIS — I129 Hypertensive chronic kidney disease with stage 1 through stage 4 chronic kidney disease, or unspecified chronic kidney disease: Secondary | ICD-10-CM | POA: Diagnosis present

## 2023-10-09 DIAGNOSIS — A4159 Other Gram-negative sepsis: Principal | ICD-10-CM | POA: Diagnosis present

## 2023-10-09 DIAGNOSIS — N12 Tubulo-interstitial nephritis, not specified as acute or chronic: Secondary | ICD-10-CM | POA: Diagnosis not present

## 2023-10-09 DIAGNOSIS — K50012 Crohn's disease of small intestine with intestinal obstruction: Secondary | ICD-10-CM | POA: Diagnosis present

## 2023-10-09 LAB — CBC
HCT: 45.2 % (ref 36.0–46.0)
Hemoglobin: 14.8 g/dL (ref 12.0–15.0)
MCH: 29.4 pg (ref 26.0–34.0)
MCHC: 32.7 g/dL (ref 30.0–36.0)
MCV: 89.7 fL (ref 80.0–100.0)
Platelets: 550 10*3/uL — ABNORMAL HIGH (ref 150–400)
RBC: 5.04 MIL/uL (ref 3.87–5.11)
RDW: 12.5 % (ref 11.5–15.5)
WBC: 24.3 10*3/uL — ABNORMAL HIGH (ref 4.0–10.5)
nRBC: 0 % (ref 0.0–0.2)

## 2023-10-09 LAB — URINALYSIS, ROUTINE W REFLEX MICROSCOPIC
Bacteria, UA: NONE SEEN
Bilirubin Urine: NEGATIVE
Glucose, UA: NEGATIVE mg/dL
Ketones, ur: NEGATIVE mg/dL
Nitrite: NEGATIVE
Protein, ur: 100 mg/dL — AB
Specific Gravity, Urine: 1.016 (ref 1.005–1.030)
WBC, UA: >50 WBC/hpf (ref 0–5)
pH: 5 (ref 5.0–8.0)

## 2023-10-09 LAB — COMPREHENSIVE METABOLIC PANEL
ALT: 10 U/L (ref 0–44)
AST: 22 U/L (ref 15–41)
Albumin: 2 g/dL — ABNORMAL LOW (ref 3.5–5.0)
Alkaline Phosphatase: 61 U/L (ref 38–126)
Anion gap: 7 (ref 5–15)
BUN: 63 mg/dL — ABNORMAL HIGH (ref 8–23)
CO2: 12 mmol/L — ABNORMAL LOW (ref 22–32)
Calcium: 5.4 mg/dL — CL (ref 8.9–10.3)
Chloride: 114 mmol/L — ABNORMAL HIGH (ref 98–111)
Creatinine, Ser: 3.41 mg/dL — ABNORMAL HIGH (ref 0.44–1.00)
GFR, Estimated: 15 mL/min — ABNORMAL LOW (ref >60)
Glucose, Bld: 111 mg/dL — ABNORMAL HIGH (ref 70–99)
Potassium: 3.5 mmol/L (ref 3.5–5.1)
Sodium: 133 mmol/L — ABNORMAL LOW (ref 135–145)
Total Bilirubin: 0.7 mg/dL (ref <1.2)
Total Protein: 4.9 g/dL — ABNORMAL LOW (ref 6.5–8.1)

## 2023-10-09 LAB — LIPASE, BLOOD: Lipase: 22 U/L (ref 11–51)

## 2023-10-09 LAB — MAGNESIUM: Magnesium: 1.3 mg/dL — ABNORMAL LOW (ref 1.7–2.4)

## 2023-10-09 MED ORDER — SODIUM CHLORIDE 0.9 % IV BOLUS
1000.0000 mL | Freq: Once | INTRAVENOUS | Status: AC
Start: 2023-10-09 — End: 2023-10-09
  Administered 2023-10-09: 1000 mL via INTRAVENOUS

## 2023-10-09 MED ORDER — MORPHINE SULFATE (PF) 4 MG/ML IV SOLN
4.0000 mg | Freq: Once | INTRAVENOUS | Status: AC
  Administered 2023-10-09: 4 mg via INTRAVENOUS
  Filled 2023-10-09: qty 1

## 2023-10-09 MED ORDER — ONDANSETRON HCL 4 MG/2ML IJ SOLN
4.0000 mg | Freq: Once | INTRAMUSCULAR | Status: AC
Start: 2023-10-09 — End: 2023-10-09
  Administered 2023-10-09: 4 mg via INTRAVENOUS
  Filled 2023-10-09: qty 2

## 2023-10-09 MED ORDER — SODIUM CHLORIDE 0.9 % IV SOLN
2.0000 g | Freq: Once | INTRAVENOUS | Status: AC
  Administered 2023-10-10: 2 g via INTRAVENOUS
  Filled 2023-10-09: qty 20

## 2023-10-09 MED ORDER — MAGNESIUM SULFATE 2 GM/50ML IV SOLN
2.0000 g | Freq: Once | INTRAVENOUS | Status: AC
  Administered 2023-10-09: 2 g via INTRAVENOUS
  Filled 2023-10-09 (×2): qty 50

## 2023-10-09 MED ORDER — CALCIUM GLUCONATE-NACL 1-0.675 GM/50ML-% IV SOLN
1.0000 g | Freq: Once | INTRAVENOUS | Status: AC
  Administered 2023-10-09: 1000 mg via INTRAVENOUS
  Filled 2023-10-09: qty 50

## 2023-10-09 MED ORDER — SODIUM CHLORIDE 0.9 % IV SOLN: INTRAVENOUS | Status: DC

## 2023-10-09 NOTE — ED Provider Notes (Signed)
[place] EMERGENCY DEPARTMENT AT [facility] Provider Note   CSN: [id number] Arrival date & time: 10/09/23  1824     History {Add pertinent medical, surgical, social history, OB history to HPI:1} Chief Complaint  Patient presents with   Crohn's Disease   Nausea    Cynthia [name] is a 62 y.o. female.  She has a history of Crohn's disease and has a history complex stricturing and fistulizing Crohn's ileocolitis (diagnosed in 08/2019)-s/p ileocolectomy, sigmoid colectomy with end ileostomy/mucous fistula.  Most of her care at [facility] and actually expects to get her colostomy reversed later this month.  It sounds like she is fairly well-controlled on octreotide and Imodium.  She said she has been sick for the last few days with generalized abdominal pain and loose output from her ostomy.  She does not know if she has had a fever.  She has mostly been weak and in bed.  Somebody checked on her today and told her she needed to go to the emergency department.  She does not think that is been any blood in the output.  The history is provided by the patient.  Abdominal Pain Pain location:  Generalized Pain quality: aching   Pain severity:  Moderate Onset quality:  Gradual Duration:  3 days Timing:  Constant Progression:  Unchanged Chronicity:  New Context: not trauma   Relieved by:  None tried Worsened by:  Nothing Ineffective treatments:  None tried Associated symptoms: diarrhea, fatigue, nausea and vomiting   Associated symptoms: no chest pain, no cough, no dysuria, no fever, no hematemesis, no hematochezia, no hematuria and no shortness of breath        Home Medications Prior to Admission medications   Medication Sig Start Date End Date Taking? Authorizing Provider  diphenhydramine-acetaminophen (TYLENOL PM) 25-500 MG TABS tablet Take 1 tablet by mouth at bedtime as needed.    [provider]  loperamide (IMODIUM) 2 MG capsule Take 1 capsule (2 mg total) by  mouth 3 (three) times daily as needed for diarrhea or loose stools. Patient not taking: Reported on 09/29/2023 07/03/22   Albertine Grates, MD  Octreotide Acetate 50 MCG/ML SOSY Inject 1 ml ( ) into the skin every 8 hours. 07/30/22   Ronnald Nian, MD  SYRINGE-NEEDLE, DISP, 3 ML 25G X 5/8" 3 ML MISC Use as directed for octreotide injection 07/03/22   Albertine Grates, MD      Allergies    Nsaids, Ace inhibitors, and Iodine    Review of Systems   Review of Systems  Constitutional:  Positive for fatigue. Negative for fever.  Respiratory:  Negative for cough and shortness of breath.   Cardiovascular:  Negative for chest pain.  Gastrointestinal:  Positive for abdominal pain, diarrhea, nausea and vomiting. Negative for hematemesis and hematochezia.  Genitourinary:  Negative for dysuria and hematuria.    Physical Exam Updated Vital Signs BP (!) 147/95   Pulse (!) 108   Temp (!) 97.3 F (36.3 C) (Oral)   Resp 12   Ht 5\' 4"  (1.626 m)   Wt 59 kg   LMP 06/10/2011 (Exact Date)   SpO2 99%   BMI 22.31 kg/m  Physical Exam Vitals and nursing note reviewed.  Constitutional:      General: She is not in acute distress.    Appearance: Normal appearance. She is well-developed.  HENT:     Head: Normocephalic and atraumatic.  Eyes:     Conjunctiva/sclera: Conjunctivae normal.  Cardiovascular:  Rate and Rhythm: Regular rhythm. Tachycardia present.     Heart sounds: No murmur heard. Pulmonary:     Effort: Pulmonary effort is normal. No respiratory distress.     Breath sounds: Normal breath sounds.  Abdominal:     Palpations: Abdomen is soft.     Tenderness: There is abdominal tenderness. There is no guarding or rebound.  Musculoskeletal:        General: No swelling.     Cervical back: Neck supple.  Skin:    General: Skin is warm and dry.     Capillary Refill: Capillary refill takes less than 2 seconds.  Neurological:     General: No focal deficit present.     Mental Status: She is alert.      Sensory: No sensory deficit.     Motor: No weakness.     ED Results / Procedures / Treatments   Labs (all labs ordered are listed, but only abnormal results are displayed) Labs Reviewed  LIPASE, BLOOD  COMPREHENSIVE METABOLIC PANEL  CBC  URINALYSIS, ROUTINE W REFLEX MICROSCOPIC  MAGNESIUM    EKG None  Radiology No results found.  Procedures Procedures  {Document cardiac monitor, telemetry assessment procedure when appropriate:1}  Medications Ordered in ED Medications  sodium chloride 0.9 % bolus 1,000 mL (has no administration in time range)  ondansetron (ZOFRAN) injection 4 mg (has no administration in time range)  morphine (PF) 4 MG/ML injection 4 mg (has no administration in time range)    ED Course/ Medical Decision Making/ A&P   {   Click here for ABCD2, HEART and other calculatorsREFRESH Note before signing :1}                              Medical Decision Making Amount and/or Complexity of Data Reviewed Labs: ordered.  Risk Prescription drug management.   This patient complains of ***; this involves an extensive number of treatment Options and is a complaint that carries with it a high risk of complications and morbidity. The differential includes ***  I ordered, reviewed and interpreted labs, which included *** I ordered medication *** and reviewed PMP when indicated. I ordered imaging studies which included *** and I independently    visualized and interpreted imaging which showed *** Additional history obtained from *** Previous records obtained and reviewed *** I consulted *** and discussed lab and imaging findings and discussed disposition.  Cardiac monitoring reviewed, *** Social determinants considered, *** Critical Interventions: ***  After the interventions stated above, I reevaluated the patient and found *** Admission and further testing considered, ***   {Document critical care time when appropriate:1} {Document review of labs and  clinical decision tools ie heart score, Chads2Vasc2 etc:1}  {Document your independent review of radiology images, and any outside records:1} {Document your discussion with family members, caretakers, and with consultants:1} {Document social determinants of health affecting pt's care:1} {Document your decision making why or why not admission, treatments were needed:1} Final Clinical Impression(s) / ED Diagnoses Final diagnoses:  None    Rx / DC Orders ED Discharge Orders     None

## 2023-10-09 NOTE — Progress Notes (Signed)
Patient is a 62 year old female with past medical history significant for recurrent UTIs, Crohn's disease status post ileostomy, CKD, who presents to the emergency department with approximately 2 days of generalized weakness and feeling poor, nonspecific abdominal pain and loose output from her ostomy.  Urology contacted due to the left UPJ staghorn calculus found on work-up.  Patient is hemodynamically stable, albeit has tachycardia in the low 100s.  She remains afebrile, and is unsure if she has had any fevers at home  Lab work significant for new onset AKI, with creatinine of 3.4 (baseline approximately 2) Patient also with significant metabolic acidosis and hypercalcemia, hypomagnesemia. She has an elevated white blood cell count at 24.3, which is elevated from 13.7 approximately 10 days ago (seen in ED approximately 10 days ago with concern for upper respiratory illness).  Upon review of CT scan, patient with staghorn renal calculus at the left UPJ with resultant moderate hydronephrosis.  Bilateral ureters appear unremarkable.  Her urinalysis is concerning for infection (positive for large leukocytes, greater than 50 white blood cells, and moderate hemoglobin).  Acute indications for decompression include obstruction with concern for infection, worsening kidney function, intractable pain, solitary kidney, etc.  Given that patient both appears clinically infected along with AKI, patient would benefit from urgent decompression.  In the setting of a large UPJ stone and resulting hydronephrosis, would favor nephrostomy tube, as patient will need definitive treatment in the form of PCNL.  #Right UPJ stone -Recommend admission to hospitalist team for further workup and correction of electrolyte abnormalities, infectious risk -Recommend urgent decompression with left nephrostomy tube -If patient clinically worsens with increasing risk for sepsis, would place Foley catheter for maximum urinary  decompression -Urine culture in process, would continue broad spectrum antibiotics at this time -Urology will continue to follow   Roby Lofts, MD Resident Physician [facility]

## 2023-10-09 NOTE — ED Triage Notes (Signed)
Pt BIBA from home. C/o N/V and poor PO intake over past few days. Pt feels like this is a Crohns flareup.  Has ostomy. Aox4  Given 4mg  of IV Zofran by EMS

## 2023-10-10 ENCOUNTER — Encounter (HOSPITAL_COMMUNITY): Payer: Self-pay

## 2023-10-10 ENCOUNTER — Inpatient Hospital Stay (HOSPITAL_COMMUNITY): Payer: Medicare Other

## 2023-10-10 DIAGNOSIS — R1084 Generalized abdominal pain: Secondary | ICD-10-CM | POA: Diagnosis present

## 2023-10-10 DIAGNOSIS — R6520 Severe sepsis without septic shock: Secondary | ICD-10-CM | POA: Diagnosis present

## 2023-10-10 DIAGNOSIS — I129 Hypertensive chronic kidney disease with stage 1 through stage 4 chronic kidney disease, or unspecified chronic kidney disease: Secondary | ICD-10-CM | POA: Diagnosis present

## 2023-10-10 DIAGNOSIS — Z87891 Personal history of nicotine dependence: Secondary | ICD-10-CM | POA: Diagnosis not present

## 2023-10-10 DIAGNOSIS — B964 Proteus (mirabilis) (morganii) as the cause of diseases classified elsewhere: Secondary | ICD-10-CM | POA: Diagnosis not present

## 2023-10-10 DIAGNOSIS — N390 Urinary tract infection, site not specified: Secondary | ICD-10-CM

## 2023-10-10 DIAGNOSIS — Z888 Allergy status to other drugs, medicaments and biological substances status: Secondary | ICD-10-CM | POA: Diagnosis not present

## 2023-10-10 DIAGNOSIS — Z886 Allergy status to analgesic agent status: Secondary | ICD-10-CM | POA: Diagnosis not present

## 2023-10-10 DIAGNOSIS — N200 Calculus of kidney: Secondary | ICD-10-CM

## 2023-10-10 DIAGNOSIS — E872 Acidosis, unspecified: Secondary | ICD-10-CM | POA: Diagnosis present

## 2023-10-10 DIAGNOSIS — N3001 Acute cystitis with hematuria: Secondary | ICD-10-CM | POA: Diagnosis not present

## 2023-10-10 DIAGNOSIS — R6510 Systemic inflammatory response syndrome (SIRS) of non-infectious origin without acute organ dysfunction: Principal | ICD-10-CM

## 2023-10-10 DIAGNOSIS — A419 Sepsis, unspecified organism: Secondary | ICD-10-CM

## 2023-10-10 DIAGNOSIS — Z5912 Inadequate housing utilities: Secondary | ICD-10-CM | POA: Diagnosis not present

## 2023-10-10 DIAGNOSIS — Z90710 Acquired absence of both cervix and uterus: Secondary | ICD-10-CM | POA: Diagnosis not present

## 2023-10-10 DIAGNOSIS — N136 Pyonephrosis: Secondary | ICD-10-CM | POA: Diagnosis present

## 2023-10-10 DIAGNOSIS — Z832 Family history of diseases of the blood and blood-forming organs and certain disorders involving the immune mechanism: Secondary | ICD-10-CM | POA: Diagnosis not present

## 2023-10-10 DIAGNOSIS — Z833 Family history of diabetes mellitus: Secondary | ICD-10-CM | POA: Diagnosis not present

## 2023-10-10 DIAGNOSIS — N179 Acute kidney failure, unspecified: Secondary | ICD-10-CM | POA: Diagnosis present

## 2023-10-10 DIAGNOSIS — N1832 Chronic kidney disease, stage 3b: Secondary | ICD-10-CM | POA: Diagnosis present

## 2023-10-10 DIAGNOSIS — N201 Calculus of ureter: Secondary | ICD-10-CM | POA: Diagnosis present

## 2023-10-10 DIAGNOSIS — Z860100 Personal history of colon polyps, unspecified: Secondary | ICD-10-CM | POA: Diagnosis not present

## 2023-10-10 DIAGNOSIS — R799 Abnormal finding of blood chemistry, unspecified: Secondary | ICD-10-CM | POA: Diagnosis not present

## 2023-10-10 DIAGNOSIS — Z87440 Personal history of urinary (tract) infections: Secondary | ICD-10-CM | POA: Diagnosis not present

## 2023-10-10 DIAGNOSIS — Z932 Ileostomy status: Secondary | ICD-10-CM | POA: Diagnosis not present

## 2023-10-10 DIAGNOSIS — A4159 Other Gram-negative sepsis: Secondary | ICD-10-CM | POA: Diagnosis present

## 2023-10-10 DIAGNOSIS — N1330 Unspecified hydronephrosis: Secondary | ICD-10-CM | POA: Diagnosis not present

## 2023-10-10 DIAGNOSIS — K219 Gastro-esophageal reflux disease without esophagitis: Secondary | ICD-10-CM | POA: Diagnosis present

## 2023-10-10 DIAGNOSIS — Z5986 Financial insecurity: Secondary | ICD-10-CM | POA: Diagnosis not present

## 2023-10-10 DIAGNOSIS — G2581 Restless legs syndrome: Secondary | ICD-10-CM | POA: Diagnosis present

## 2023-10-10 DIAGNOSIS — F418 Other specified anxiety disorders: Secondary | ICD-10-CM | POA: Diagnosis present

## 2023-10-10 HISTORY — PX: IR NEPHROSTOMY PLACEMENT LEFT: IMG6063

## 2023-10-10 LAB — COMPREHENSIVE METABOLIC PANEL
ALT: 19 U/L (ref 0–44)
AST: 21 U/L (ref 15–41)
Albumin: 3.6 g/dL (ref 3.5–5.0)
Alkaline Phosphatase: 90 U/L (ref 38–126)
Anion gap: 16 — ABNORMAL HIGH (ref 5–15)
BUN: 86 mg/dL — ABNORMAL HIGH (ref 8–23)
CO2: 18 mmol/L — ABNORMAL LOW (ref 22–32)
Calcium: 9.2 mg/dL (ref 8.9–10.3)
Chloride: 99 mmol/L (ref 98–111)
Creatinine, Ser: 4.82 mg/dL — ABNORMAL HIGH (ref 0.44–1.00)
GFR, Estimated: 10 mL/min — ABNORMAL LOW (ref >60)
Glucose, Bld: 112 mg/dL — ABNORMAL HIGH (ref 70–99)
Potassium: 4.5 mmol/L (ref 3.5–5.1)
Sodium: 133 mmol/L — ABNORMAL LOW (ref 135–145)
Total Bilirubin: 0.5 mg/dL (ref <1.2)
Total Protein: 8.4 g/dL — ABNORMAL HIGH (ref 6.5–8.1)

## 2023-10-10 LAB — MAGNESIUM: Magnesium: 2.6 mg/dL — ABNORMAL HIGH (ref 1.7–2.4)

## 2023-10-10 LAB — CBC
HCT: 44.3 % (ref 36.0–46.0)
Hemoglobin: 14.2 g/dL (ref 12.0–15.0)
MCH: 29.5 pg (ref 26.0–34.0)
MCHC: 32.1 g/dL (ref 30.0–36.0)
MCV: 91.9 fL (ref 80.0–100.0)
Platelets: 506 10*3/uL — ABNORMAL HIGH (ref 150–400)
RBC: 4.82 MIL/uL (ref 3.87–5.11)
RDW: 12.6 % (ref 11.5–15.5)
WBC: 33 10*3/uL — ABNORMAL HIGH (ref 4.0–10.5)
nRBC: 0 % (ref 0.0–0.2)

## 2023-10-10 LAB — PROTIME-INR
INR: 1.8 — ABNORMAL HIGH (ref 0.8–1.2)
Prothrombin Time: 20.7 s — ABNORMAL HIGH (ref 11.4–15.2)

## 2023-10-10 LAB — I-STAT CG4 LACTIC ACID, ED: Lactic Acid, Venous: <0.3 mmol/L — ABNORMAL LOW (ref 0.5–1.9)

## 2023-10-10 LAB — HIV ANTIBODY (ROUTINE TESTING W REFLEX): HIV Screen 4th Generation wRfx: NONREACTIVE

## 2023-10-10 LAB — PHOSPHORUS: Phosphorus: 5.1 mg/dL — ABNORMAL HIGH (ref 2.5–4.6)

## 2023-10-10 MED ORDER — MIDAZOLAM HCL 2 MG/2ML IJ SOLN
INTRAMUSCULAR | Status: AC
  Filled 2023-10-10: qty 2

## 2023-10-10 MED ORDER — SENNOSIDES-DOCUSATE SODIUM 8.6-50 MG PO TABS
1.0000 | ORAL_TABLET | Freq: Every evening | ORAL | Status: DC | PRN
Start: 2023-10-10 — End: 2023-10-11

## 2023-10-10 MED ORDER — HYDROMORPHONE HCL 1 MG/ML IJ SOLN
0.5000 mg | Freq: Once | INTRAMUSCULAR | Status: AC | PRN
  Administered 2023-10-10: 0.5 mg via INTRAVENOUS
  Filled 2023-10-10: qty 0.5

## 2023-10-10 MED ORDER — ONDANSETRON HCL 4 MG/2ML IJ SOLN
4.0000 mg | Freq: Four times a day (QID) | INTRAMUSCULAR | Status: DC | PRN
  Administered 2023-10-10 – 2023-10-15 (×9): 4 mg via INTRAVENOUS
  Filled 2023-10-10 (×9): qty 2

## 2023-10-10 MED ORDER — ACETAMINOPHEN 650 MG RE SUPP: 650.0000 mg | Freq: Four times a day (QID) | RECTAL | Status: DC | PRN

## 2023-10-10 MED ORDER — ONDANSETRON HCL 4 MG PO TABS: 4.0000 mg | ORAL_TABLET | Freq: Four times a day (QID) | ORAL | Status: DC | PRN

## 2023-10-10 MED ORDER — MORPHINE SULFATE (PF) 2 MG/ML IV SOLN
2.0000 mg | INTRAVENOUS | Status: DC | PRN
  Administered 2023-10-10 (×2): 2 mg via INTRAVENOUS
  Filled 2023-10-10 (×2): qty 1

## 2023-10-10 MED ORDER — MELATONIN 3 MG PO TABS
3.0000 mg | ORAL_TABLET | Freq: Once | ORAL | Status: AC
  Administered 2023-10-10: 3 mg via ORAL
  Filled 2023-10-10: qty 1

## 2023-10-10 MED ORDER — ONDANSETRON HCL 4 MG/2ML IJ SOLN
INTRAMUSCULAR | Status: AC
  Administered 2023-10-10: 4 mg via INTRAVENOUS
  Filled 2023-10-10: qty 2

## 2023-10-10 MED ORDER — FENTANYL CITRATE (PF) 100 MCG/2ML IJ SOLN
INTRAMUSCULAR | Status: AC
  Filled 2023-10-10: qty 2

## 2023-10-10 MED ORDER — OCTREOTIDE ACETATE 100 MCG/ML IJ SOLN
50.0000 ug | Freq: Three times a day (TID) | INTRAMUSCULAR | Status: DC
  Administered 2023-10-10 – 2023-10-15 (×15): 50 ug via SUBCUTANEOUS
  Filled 2023-10-10 (×15): qty 1

## 2023-10-10 MED ORDER — PROCHLORPERAZINE EDISYLATE 10 MG/2ML IJ SOLN
5.0000 mg | Freq: Once | INTRAMUSCULAR | Status: AC | PRN
  Administered 2023-10-10: 5 mg via INTRAVENOUS
  Filled 2023-10-10: qty 2

## 2023-10-10 MED ORDER — LOPERAMIDE HCL 2 MG PO CAPS: 2.0000 mg | ORAL_CAPSULE | Freq: Three times a day (TID) | ORAL | Status: DC | PRN

## 2023-10-10 MED ORDER — ENOXAPARIN SODIUM 30 MG/0.3ML IJ SOSY
30.0000 mg | PREFILLED_SYRINGE | INTRAMUSCULAR | Status: DC
  Administered 2023-10-11 – 2023-10-15 (×5): 30 mg via SUBCUTANEOUS
  Filled 2023-10-10 (×5): qty 0.3

## 2023-10-10 MED ORDER — LIDOCAINE-EPINEPHRINE 1 %-1:100000 IJ SOLN
20.0000 mL | Freq: Once | INTRAMUSCULAR | Status: AC
  Administered 2023-10-10: 18 mL via INTRADERMAL
  Filled 2023-10-10: qty 20

## 2023-10-10 MED ORDER — CEFTRIAXONE SODIUM 2 G IJ SOLR
2.0000 g | INTRAMUSCULAR | Status: DC
  Administered 2023-10-10 – 2023-10-13 (×4): 2 g via INTRAVENOUS
  Filled 2023-10-10 (×4): qty 20

## 2023-10-10 MED ORDER — FENTANYL CITRATE (PF) 100 MCG/2ML IJ SOLN
INTRAMUSCULAR | Status: AC | PRN
Start: 2023-10-10 — End: 2023-10-10
  Administered 2023-10-10 (×2): 50 ug via INTRAVENOUS

## 2023-10-10 MED ORDER — ONDANSETRON HCL 4 MG/2ML IJ SOLN
4.0000 mg | Freq: Once | INTRAMUSCULAR | Status: AC
Start: 2023-10-10 — End: 2023-10-10
  Administered 2023-10-10: 4 mg via INTRAVENOUS
  Filled 2023-10-10: qty 2

## 2023-10-10 MED ORDER — ACETAMINOPHEN 325 MG PO TABS
650.0000 mg | ORAL_TABLET | Freq: Four times a day (QID) | ORAL | Status: DC | PRN
  Administered 2023-10-10 – 2023-10-15 (×4): 650 mg via ORAL
  Filled 2023-10-10 (×4): qty 2

## 2023-10-10 MED ORDER — HYDROMORPHONE HCL 1 MG/ML IJ SOLN
0.5000 mg | INTRAMUSCULAR | Status: DC | PRN
  Administered 2023-10-10 – 2023-10-14 (×10): 0.5 mg via INTRAVENOUS
  Filled 2023-10-10 (×10): qty 0.5

## 2023-10-10 MED ORDER — ONDANSETRON HCL 4 MG/2ML IJ SOLN: 4.0000 mg | Freq: Once | INTRAMUSCULAR | Status: AC

## 2023-10-10 MED ORDER — SODIUM CHLORIDE 0.9 % IV SOLN: INTRAVENOUS | Status: DC

## 2023-10-10 MED ORDER — IOHEXOL 300 MG/ML  SOLN
50.0000 mL | Freq: Once | INTRAMUSCULAR | Status: AC | PRN
  Administered 2023-10-10: 15 mL

## 2023-10-10 MED ORDER — MIDAZOLAM HCL 2 MG/2ML IJ SOLN
INTRAMUSCULAR | Status: AC | PRN
Start: 2023-10-10 — End: 2023-10-10
  Administered 2023-10-10 (×2): 1 mg via INTRAVENOUS

## 2023-10-10 MED ORDER — ONDANSETRON HCL 4 MG/2ML IJ SOLN
INTRAMUSCULAR | Status: AC
  Filled 2023-10-10: qty 2

## 2023-10-10 MED ORDER — LIDOCAINE-EPINEPHRINE 1 %-1:100000 IJ SOLN
INTRAMUSCULAR | Status: AC
  Filled 2023-10-10: qty 1

## 2023-10-10 MED ORDER — ONDANSETRON HCL 4 MG/2ML IJ SOLN
INTRAMUSCULAR | Status: AC | PRN
Start: 2023-10-10 — End: 2023-10-10
  Administered 2023-10-10: 4 mg via INTRAVENOUS

## 2023-10-10 NOTE — Consult Note (Cosign Needed Addendum)
Urology Consult Note   Requesting Attending Physician:  Zadie Rhine, MD Service Providing Consult: Urology  Consulting Attending: Dr. Zadie Rhine.   Reason for Consult:  Left UPJ stone  HPI:  Patient is a 62 year old female with past medical history significant for recurrent UTIs, Crohn's disease status post ileostomy, CKD, who presents to the emergency department with approximately 2 days of generalized weakness and feeling poor, nonspecific abdominal pain and loose output from her ostomy.   Urology contacted due to the left UPJ staghorn calculus found on work-up.   Patient is hemodynamically stable, albeit has tachycardia in the low 100s.  She remains afebrile, and is unsure if she has had any fevers at home   Lab work significant for new onset AKI, with creatinine of 3.4 (baseline approximately 2) Patient also with significant metabolic acidosis and hypercalcemia, hypomagnesemia. She has an elevated white blood cell count at 24.3, which is elevated from 13.7 approximately 10 days ago (seen in ED approximately 10 days ago with concern for upper respiratory illness).   Upon review of CT scan, patient with staghorn renal calculus at the left UPJ with resultant moderate hydronephrosis.  Bilateral ureters appear unremarkable.  Her urinalysis is concerning for infection (positive for large leukocytes, greater than 50 white blood cells, and moderate hemoglobin).  Pt without any history of nephrolithiasis - seen by [facility] back >10 years ago for rUTI's.   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    GERD (gastroesophageal reflux disease)    Headache(784.0)    MIGRAINE   Heart murmur    Hiatal hernia    History of noncompliance with medical treatment 04/13/2022   History of rectal polyp 2020 01/12/2021   History of serrated colonic polyp 01/13/2011   Hypertension    Incontinence    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HCC)    MVP (mitral valve prolapse)    RLS (restless legs syndrome) 09/25/2014   Sepsis due to cellulitis (HCC) 04/05/2021    Past Surgical History:  Past Surgical History:  Procedure Laterality Date   ABDOMINAL HYSTERECTOMY  06/17/2011   Procedure: HYSTERECTOMY ABDOMINAL;  Surgeon: Melony Overly;  Location: [facility];  Service: Gynecology;  Laterality: N/A;  Converted to Abdominal Hysterectomy with lysis of adhesions    ANTERIOR AND POSTERIOR REPAIR  02/25/2012   Procedure: ANTERIOR (CYSTOCELE) AND POSTERIOR REPAIR (RECTOCELE);  Surgeon: Loney Laurence, MD;  Location: [facility];  Service: Gynecology;  Laterality: N/A;  ANterior cystocele repair ONLY   BIOPSY  08/29/2019   Procedure: BIOPSY;  Surgeon: Shellia Cleverly, DO;  Location: [facility];  Service: Gastroenterology;;   BLADDER SUSPENSION  02/25/2012   Procedure: TRANSVAGINAL TAPE (TVT) PROCEDURE;  Surgeon: Loney Laurence, MD;  Location: [facility];  Service: Gynecology;  Laterality: N/A;   COLECTOMY WITH COLOSTOMY CREATION/HARTMANN PROCEDURE  02/19/2021   Sigmoid colectomy w descnding colon MF/ostomy   COLONOSCOPY WITH PROPOFOL N/A 08/29/2019   Procedure: COLONOSCOPY WITH PROPOFOL;  Surgeon: Shellia Cleverly, DO;  Location: [facility];  Service: Gastroenterology;  Laterality: N/A;   CYSTOSCOPY  02/25/2012   Procedure: CYSTOSCOPY;  Surgeon:  Loney Laurence, MD;  Location: [facility];  Service: Gynecology;  Laterality: N/A;   ILEOCECETOMY  02/19/2021   Dr Oren Bracket @ [facility]   ILEOSTOMY  02/19/2021   [facility] - Dr Oren Bracket   KNEE ARTHROSCOPY  2010   LAPAROSCOPIC ASSISTED VAGINAL HYSTERECTOMY  06/17/2011   Procedure:  LAPAROSCOPIC ASSISTED VAGINAL HYSTERECTOMY;  Surgeon: Melony Overly;  Location: [facility];  Service: Gynecology;  Laterality: N/A;  Attempted    MUCUS FISTULA - RECTOSIGMOID  02/19/2021   POLYPECTOMY  08/29/2019   Procedure: POLYPECTOMY;  Surgeon: Shellia Cleverly, DO;  Location: [facility];  Service: Gastroenterology;;   SALPINGOOPHORECTOMY  06/17/2011   Procedure: SALPINGO OOPHERECTOMY;  Surgeon: Melony Overly;  Location: [facility];  Service: Gynecology;  Laterality: Bilateral;   TONSILLECTOMY      Medication: Current Facility-Administered Medications  Medication Dose Route Frequency Provider Last Rate Last Admin   0.9 %  sodium chloride infusion   Intravenous Continuous Terrilee Files, MD 125 mL/hr at 10/10/23 0007 New Bag at 10/10/23 0007   morphine (PF) 2 MG/ML injection 2 mg  2 mg Intravenous Q1H PRN Zadie Rhine, MD   2 mg at 10/10/23 0121   Current Outpatient Medications  Medication Sig Dispense Refill   diphenhydramine-acetaminophen (TYLENOL PM) 25-500 MG TABS tablet Take 1 tablet by mouth at bedtime as needed.     Octreotide Acetate 50 MCG/ML SOSY Inject 1 ml ( ) into the skin every 8 hours. 85 mL 5   STELARA 90 MG/ML SOSY injection Inject 90 mg into the skin every 3 (three) months.     loperamide (IMODIUM) 2 MG capsule Take 1 capsule (2 mg total) by mouth 3 (three) times daily as needed for diarrhea or loose stools. (Patient not taking: Reported on 09/29/2023) 30 capsule 0   SYRINGE-NEEDLE, DISP, 3 ML 25G X 5/8" 3 ML MISC Use as directed for octreotide injection 90 each 0    Allergies: Allergies  Allergen Reactions   Nsaids Other (See Comments)    Crohn's disease & CKD   Ace Inhibitors Cough   Iodine Other (See Comments)    The patient said she had a reaction from iodine in some eye drops- caused eyes to turn red;  pt has no reactions to CT contrast    Social History: Social History   Tobacco Use   Smoking status: Former    Current packs/day: 0.00    Types:  Cigarettes    Quit date: 12/18/2022    Years since quitting: 0.8   Smokeless tobacco: Never   Tobacco comments:    none in 2 weeks as of 12/04/20  Vaping Use   Vaping status: Former  Substance Use Topics   Alcohol use: Not Currently   Drug use: Not Currently    Types: Cocaine, Marijuana    Comment: clean x 8 weeks    Family History Family History  Problem Relation Age of Onset   Diabetes Mother    Dementia Mother    Clotting disorder Father    Macular degeneration Father    Crohn's disease Sister    Colon cancer Neg Hx    Esophageal cancer Neg Hx    Pancreatic cancer Neg Hx    Stomach cancer Neg Hx    Liver disease Neg Hx     Review of Systems 10 systems were reviewed and are negative except as noted specifically in the HPI.  Objective   Vital signs in last 24 hours: BP (!) 134/95   Pulse  90   Temp 97.8 F (36.6 C) (Oral)   Resp 18   Ht 5\' 4"  (1.626 m)   Wt 59 kg   LMP 06/10/2011 (Exact Date)   SpO2 100%   BMI 22.31 kg/m   Physical Exam General: NAD, A&O, resting on stretcher, appears uncomfortable.  HEENT: JAARS/AT, EOMI, MMM Pulmonary: Normal work of breathing Cardiovascular: HDS, adequate peripheral perfusion Abdomen: Soft, NTTP, nondistended, Ileostomy in place with stool in bag. GU: No SPT tenderness, no flank pain.  Extremities: warm and well perfused Neuro: Tear but appropriate affect.   Most Recent Labs: Lab Results  Component Value Date   WBC 24.3 (H) 10/09/2023   HGB 14.8 10/09/2023   HCT 45.2 10/09/2023   PLT 550 (H) 10/09/2023    Lab Results  Component Value Date   NA 133 (L) 10/09/2023   K 3.5 10/09/2023   CL 114 (H) 10/09/2023   CO2 12 (L) 10/09/2023   BUN 63 (H) 10/09/2023   CREATININE 3.41 (H) 10/09/2023   CALCIUM 5.4 (LL) 10/09/2023   MG 1.3 (L) 10/09/2023   PHOS 3.2 04/14/2022    Lab Results  Component Value Date   INR 1.8 (H) 10/10/2023   APTT 27 04/05/2021     Urine Culture: @[id number] (laburin,org,[id number])@    IMAGING: CT ABDOMEN PELVIS WO CONTRAST  Result Date: 10/09/2023 CLINICAL DATA:  Abdominal pain, nausea and vomiting, history of Crohn disease EXAM: CT ABDOMEN AND PELVIS WITHOUT CONTRAST TECHNIQUE: Multidetector CT imaging of the abdomen and pelvis was performed following the standard protocol without IV contrast. Unenhanced CT was performed per clinician order. Lack of IV contrast limits sensitivity and specificity, especially for evaluation of abdominal/pelvic solid viscera. RADIATION DOSE REDUCTION: This exam was performed according to the departmental dose-optimization program which includes automated exposure control, adjustment of the mA and/or kV according to patient size and/or use of iterative reconstruction technique. COMPARISON:  07/01/2022 FINDINGS: Lower chest: Subpleural consolidation and volume loss within the medial aspect of the right lower lobe, favor atelectasis and/or scarring. No acute airspace disease. Hepatobiliary: Cholelithiasis without cholecystitis. Unremarkable unenhanced appearance of the liver. Pancreas: Unremarkable unenhanced appearance. Spleen: Unremarkable unenhanced appearance. Adrenals/Urinary Tract: 2.5 cm staghorn type calculus at the left UPJ, with resulting moderate to severe left hydronephrosis. The right kidney is unremarkable. The adrenals and bladder are normal. Stomach/Bowel: Stable postsurgical changes from subtotal colectomy, Hartmann's pouch, right-sided ileostomy, and left-sided mucous fistula. No bowel obstruction or ileus. No bowel wall thickening or inflammatory change. Vascular/Lymphatic: Aortic atherosclerosis. No enlarged abdominal or pelvic lymph nodes. Reproductive: Status post hysterectomy. No adnexal masses. Other: No free fluid or free intraperitoneal gas. No abdominal wall hernia. Musculoskeletal: No acute or destructive bony abnormalities. Reconstructed images demonstrate no additional findings. IMPRESSION: 1. Obstructing 2.5 cm staghorn calculus  at the left UPJ, with moderate to severe left-sided hydronephrosis. 2. Cholelithiasis without cholecystitis. 3. Extensive postsurgical changes of the bowel, stable since prior study. No obstruction or ileus. No inflammatory change. 4. Subpleural consolidation and volume loss within the medial basilar segment of the right lower lobe, likely scarring or atelectasis. 5.  Aortic Atherosclerosis (ICD10-I70.0). Electronically Signed   By: Sharlet Salina M.D.   On: 10/09/2023 22:15    ------  Assessment:  Patient is a 62 year old female with past medical history significant for recurrent UTIs, Crohn's disease status post ileostomy, CKD, who presents to the emergency department with approximately 2 days of generalized weakness and feeling poor, nonspecific abdominal pain and loose output from her ostomy.   I  believe her generalized malaise and discomfort is 2/2 her profound metabolic acidosis, but will defer further work-up and treatment to primary team.   Pt found to have >2 cm stone burden Left UPJ stone with subsequent hydronephrosis. Acute indications for decompression include obstruction with concern for infection, worsening kidney function, intractable pain, solitary kidney, etc.  Given that patient both appears clinically infected along with AKI, patient would benefit from urgent decompression.  In the setting of a large UPJ stone and resulting hydronephrosis, would favor nephrostomy tube, as patient will need definitive treatment in the form of PCNL.  #Recommendations:   #Left UPJ stone -please keep pt NPO -Appreciate recommendations from hospitalist team for further workup and correction of electrolyte abnormalities, infectious risk -Recommend decompression with left nephrostomy tube -If patient clinically worsens with increasing risk for sepsis, would place Foley catheter for maximum urinary decompression -Urine culture in process, would continue broad spectrum antibiotics at this time -Urology  will continue to follow    Thank you for this consult. Please contact the urology consult pager with any further questions/concerns.   Roby Lofts, MD Resident [facility]ician [facility]  I was present for the interview and exam I agree with the assessment and plan  Sherle Poe MD

## 2023-10-10 NOTE — H&P (Addendum)
History and Physical    Patient: Cynthia [name] AOZ:[id number] DOB: [birth year] DOA: 10/09/2023 DOS: the patient was seen and examined on 10/10/2023 PCP: Ronnald Nian, MD  Patient coming from: Home  Chief Complaint:  Chief Complaint  Patient presents with   Crohn's Disease   Nausea   HPI: Cynthia [name] is a 62 y.o. female with medical history significant of Crohn's disease s/p ileocolectomy, sigmoid colectomy with an ileostomy, recurrent UTIs, anxiety and depression, HTN and CKD 3B for evaluation of generalized weakness and feeling unwell for the past 2 to 3 days. Patient is follows with [facility] for her Crohn's disease which has been well-managed on her octreotide 3 times daily. Over the past 2 to 3 days, she has had nausea and vomiting of foul-smelling brownish material. She has had associated right flank pain and back pain, fever and malaise. She initially had loose stools from her ostomy bag but now has decreased output due to recent poor p.o. intake.  She reports mild abdominal cramps but denies chest pain, cough, dysuria hematemesis, hematuria, shortness of breath or bloody stools.  ED course: Initial vitals with temp 97.3, RR 12, HR 108, BP 147/95, SpO2 99% on room air. Labs show sodium 133, K+ 3.5, bicarb 12, BUN/creatinine 63/3.41, calcium 5.4, albumin 2.0, WBC 24.3, Hgb 14.8, platelet 550, lipase 22, mag 1.3, PT/INR 20.7/1.8, lactic acid  <0.3, UA shows moderate hemoglobinuria, significant proteinuria, negative nitrate, large leuks, RBC 21-50, WBC >50, but no bacteria. CT A/P shows obstructive 2.5 cm staghorn calculus at the left UPJ with moderate to severe left-sided hydronephrosis but no obstruction, ileus or inflammatory changes. Patient received IV Zofran x 3, IV mag, IV calcium, IV Rocephin and morphine Urology was consulted for evaluation [facility] was consulted for admission  Review of Systems: As mentioned in the history of present illness. All other systems reviewed and  are negative. 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   GERD (gastroesophageal reflux disease)    Headache(784.0)    MIGRAINE   Heart murmur    Hiatal hernia    History of noncompliance with medical treatment 04/13/2022   History of rectal polyp 2020 01/12/2021   History of serrated colonic polyp 01/13/2011   Hypertension    Incontinence    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HCC)    MVP (mitral valve prolapse)    RLS (restless legs syndrome) 09/25/2014   Sepsis due to cellulitis (HCC) 04/05/2021   Past Surgical History:  Procedure Laterality Date   ABDOMINAL HYSTERECTOMY  06/17/2011   Procedure: HYSTERECTOMY ABDOMINAL;  Surgeon: Melony Overly;  Location: [facility];  Service: Gynecology;  Laterality: N/A;  Converted to Abdominal Hysterectomy with lysis of adhesions    ANTERIOR AND POSTERIOR REPAIR  02/25/2012   Procedure: ANTERIOR (CYSTOCELE) AND POSTERIOR REPAIR (RECTOCELE);  Surgeon: Loney Laurence, MD;  Location: [facility];  Service: Gynecology;  Laterality: N/A;  ANterior cystocele repair ONLY   BIOPSY  08/29/2019   Procedure: BIOPSY;  Surgeon: Shellia Cleverly, DO;  Location: [facility];  Service: Gastroenterology;;   BLADDER SUSPENSION  02/25/2012   Procedure: TRANSVAGINAL TAPE (TVT) PROCEDURE;  Surgeon: Loney Laurence, MD;  Location: [facility];  Service: Gynecology;  Laterality: N/A;   COLECTOMY WITH COLOSTOMY CREATION/HARTMANN PROCEDURE  02/19/2021   Sigmoid colectomy w descnding colon MF/ostomy   COLONOSCOPY WITH PROPOFOL N/A 08/29/2019   Procedure: COLONOSCOPY WITH PROPOFOL;  Surgeon: Shellia Cleverly, DO;   Location: [facility];  Service: Gastroenterology;  Laterality: N/A;   CYSTOSCOPY  02/25/2012   Procedure: CYSTOSCOPY;  Surgeon: Loney Laurence, MD;  Location: [facility];  Service: Gynecology;  Laterality: N/A;   ILEOCECETOMY  02/19/2021   Dr Oren Bracket @ [facility]   ILEOSTOMY  02/19/2021   [facility] - Dr Oren Bracket   KNEE ARTHROSCOPY  2010   LAPAROSCOPIC ASSISTED VAGINAL HYSTERECTOMY  06/17/2011   Procedure: LAPAROSCOPIC ASSISTED VAGINAL HYSTERECTOMY;  Surgeon: Melony Overly;  Location: [facility];  Service: Gynecology;  Laterality: N/A;  Attempted    MUCUS FISTULA - RECTOSIGMOID  02/19/2021   POLYPECTOMY  08/29/2019   Procedure: POLYPECTOMY;  Surgeon: Shellia Cleverly, DO;  Location: [facility];  Service: Gastroenterology;;   SALPINGOOPHORECTOMY  06/17/2011   Procedure: SALPINGO OOPHERECTOMY;  Surgeon: Melony Overly;  Location: [facility];  Service: Gynecology;  Laterality: Bilateral;   TONSILLECTOMY     Social History:  reports that she quit smoking about 9 months ago. Her smoking use included cigarettes. She has never used smokeless tobacco. She reports that she does not currently use alcohol. She reports that she does not currently use drugs after having used the following drugs: Cocaine and Marijuana.  Allergies  Allergen Reactions   Nsaids Other (See Comments)    Crohn's disease & CKD   Ace Inhibitors Cough   Iodine Other (See Comments)    The patient said she had a reaction from iodine in some eye drops- caused eyes to turn red;  pt has no reactions to CT contrast    Family History  Problem Relation Age of Onset   Diabetes Mother    Dementia Mother    Clotting disorder Father    Macular degeneration Father    Crohn's disease Sister    Colon cancer Neg Hx    Esophageal cancer Neg Hx    Pancreatic cancer Neg Hx    Stomach cancer Neg Hx    Liver disease Neg Hx     Prior to Admission medications   Medication Sig Start Date End Date Taking? Authorizing Provider   diphenhydramine-acetaminophen (TYLENOL PM) 25-500 MG TABS tablet Take 1 tablet by mouth at bedtime as needed.   Yes [provider]  Octreotide Acetate 50 MCG/ML SOSY Inject 1 ml ( ) into the skin every 8 hours. 07/30/22  Yes Ronnald Nian, MD  STELARA 90 MG/ML SOSY injection Inject 90 mg into the skin every 3 (three) months. 01/22/23  Yes [provider]  loperamide (IMODIUM) 2 MG capsule Take 1 capsule (2 mg total) by mouth 3 (three) times daily as needed for diarrhea or loose stools. Patient not taking: Reported on 09/29/2023 07/03/22   Albertine Grates, MD  SYRINGE-NEEDLE, DISP, 3 ML 25G X 5/8" 3 ML MISC Use as directed for octreotide injection 07/03/22   Albertine Grates, MD    Physical Exam: Vitals:   10/10/23 0530 10/10/23 0630 10/10/23 0805 10/10/23 0810  BP: (!) 152/97 (!) 141/94 (!) 140/101   Pulse: (!) 102 (!) 103 (!) 106 (!) 102  Resp: 18 18 20    Temp:   98 F (36.7 C)   TempSrc:   Oral   SpO2: 98% 98% 99%  100%  Weight:      Height:       General: Pleasant, well-appearing woman laying in bed. No acute distress. HEENT: [place]/AT. Anicteric sclera. Dry mucous membrane. CV: Tachycardic, regular rhythm. No murmurs, rubs, or gallops. No LE edema Pulmonary: Lungs CTAB. Normal effort. No wheezing or rales. Abdominal: Soft. Mild generalized ttp. No guarding or rebound. Hypoactive bowel sounds. Ostomy bag in place with no output. Extremities: Palpable radial and DP pulses. Normal ROM. Skin: Warm and dry. No obvious rash or lesions. Neuro: A&Ox3. Moves all extremities. Normal sensation to light touch. No focal deficit. Psych: Anxious mood, occasionally teary  Data Reviewed: Labs show sodium 133, K+ 3.5, bicarb 12, BUN/creatinine 63/3.41, calcium 5.4, albumin 2.0, WBC 24.3, Hgb 14.8, platelet 550, lipase 22, mag 1.3, PT/INR 20.7/1.8, lactic acid  <0.3, UA shows moderate hemoglobinuria, significant proteinuria, negative nitrate, large leuks, RBC 21-50, WBC >50, but no bacteria. CT  A/P shows obstructive 2.5 cm staghorn calculus at the left UPJ with moderate to severe left-sided hydronephrosis but no obstruction, ileus or inflammatory changes.   Assessment and Plan: Cynthia [name] is a 62 y.o. female with medical history significant of Crohn's disease s/p ileocolectomy, sigmoid colectomy with an ileostomy, recurrent UTIs, CKD 3, anxiety and depression, HTN and CKD 3B for evaluation of generalized weakness and feeling unwell for the past 2 to 3 days and admitted for left UPJ stone  # Obstructive left UPJ stone # Left hydronephrosis # Microscopic hematuria Patient with a history of Crohn's and CKD 3 presented with nausea, vomiting with generalized weakness and fevers found to have infected urine, worsening kidney function and a 2.5 cm staghorn calculus at the left UPJ with moderate to severe left-sided hydronephrosis.  Patient with persistent nausea vomiting but hemodynamically stable. -Urology consulted, appreciate recs -Continue IV NS at 100 cc/h for 24 hours -IV Dilaudid as needed for pain -IV Zofran as needed for nausea vomiting -Trend renal function, electrolytes  # Acute complicated cystitis Patient with history of recurrent UTIs presented with generalized weakness, nausea vomiting and found to have evidence of infection on UA in the setting of a ureteral stone. Patient afebrile but WBC significantly elevated to 24.3. Likely an element of hemoconcentration. -Continue IV Rocephin -IVF as above -Trend CBC, fever curve  # AKI on CKD 3B # Metabolic acidosis Baseline creatinine around 2. Found to have creatinine of 3.4 on admission with K+ of 3.5 and bicarb of 12. AKI secondary to obstruction from ureteral stone. -Continue IVF -Trend renal function -Avoid nephrotoxic agents  # Electrolyte abnormalities Patient with hypomagnesemia with mag of 1.3 and hypocalcemia with calcium of 5.4, corrected calcium of 7.0. Abnormalities likely secondary to her nausea and  vomiting.  Repleted overnight. -Follow-up repeat mag and calcium  # Crohn's disease Status post ileostomy currently on octreotide 3 times daily as well as Stelera every 90 days. States her symptoms have been well-controlled recently with the octreotide. Currently pending reversal of colostomy on December 23rd at [facility]. -Resume octreotide 50 mcg every 8 hours -As needed Zofran for nausea vomiting -As needed IV Dilaudid for pain -As needed Imodium for loose stools or diarrhea    Advance Care Planning:   Code Status: Full Code   Consults: Urology  Family Communication: Discussed admission with daughter on the phone  Severity of Illness: The appropriate patient status for this patient is INPATIENT. Inpatient status is judged to be reasonable and necessary in order to provide the required intensity of service to ensure  the patient's safety. The patient's presenting symptoms, physical exam findings, and initial radiographic and laboratory data in the context of their chronic comorbidities is felt to place them at high risk for further clinical deterioration. Furthermore, it is not anticipated that the patient will be medically stable for discharge from the hospital within 2 midnights of admission.   * I certify that at the point of admission it is my clinical judgment that the patient will require inpatient hospital care spanning beyond 2 midnights from the point of admission due to high intensity of service, high risk for further deterioration and high frequency of surveillance required.*  Author: Steffanie Rainwater, MD 10/10/2023 10:29 AM  For on call review [email or web address].

## 2023-10-10 NOTE — Consult Note (Signed)
Chief Complaint: Patient was seen in consultation today for left hydronephrosis.  Referring Physician(s): Zettie Pho, MD  Supervising Physician: Malachy Moan  Patient Status: [facility] - In-pt  History of Present Illness: Cynthia [name] is a 62 y.o. female with a past medical history significant for anxiety, depression, RLS, GERD, CKD III, recurrent UTIs, Crohn's disease s/p ileostomy who presented to [facility] ED yesterday evening with complaints of generalized weakness, malaise, abdominal pain, nausea, vomiting and loose output from her ostomy. On initial evaluation in the ED she was noted to have significant leukocytosis, AKI and hypocalcemia. She was noted to be tachycardic and hypertensive. CT abd/pelvis w/o contrast was obtained which showed:  1. Obstructing 2.5 cm staghorn calculus at the left UPJ, with moderate to severe left-sided hydronephrosis. 2. Cholelithiasis without cholecystitis. 3. Extensive postsurgical changes of the bowel, stable since prior study. No obstruction or ileus. No inflammatory change. 4. Subpleural consolidation and volume loss within the medial basilar segment of the right lower lobe, likely scarring or atelectasis. 5.  Aortic Atherosclerosis (ICD10-I70.0).   Urology was consulted and recommendation was made for acute decompression in the setting of infection with worsening kidney function. IR has been consulted for left PCN placement.  Patient seen on the floor, she is tearful and overwhelmed with everything that has happened since yesterday. She does not like the idea of having a drain but understands why it is necessary. She is concerned about laying on her stomach due to her ostomy as well as abdominal pain. She has a lot of mucus and is spitting up phelgm often during our visit. She is hopeful that her daughter will come visit her because she is scared but she does not want to ask her to come visit for fear that she will say no. She asked that I call  her daughter Cynthia [name] to update her on the procedure - call placed around 1 pm with no answer, voicemail left requesting call back to IR APP office.  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   GERD (gastroesophageal reflux disease)    Headache(784.0)    MIGRAINE   Heart murmur    Hiatal hernia    History of noncompliance with medical treatment 04/13/2022   History of rectal polyp 2020 01/12/2021   History of serrated colonic polyp 01/13/2011   Hypertension    Incontinence    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HCC)    MVP (mitral valve prolapse)    RLS (restless legs syndrome) 09/25/2014   Sepsis due to cellulitis (HCC) 04/05/2021    Past Surgical History:  Procedure Laterality Date   ABDOMINAL HYSTERECTOMY  06/17/2011   Procedure: HYSTERECTOMY ABDOMINAL;  Surgeon: Melony Overly;  Location: [facility];  Service: Gynecology;  Laterality: N/A;  Converted to Abdominal Hysterectomy with lysis of adhesions    ANTERIOR AND POSTERIOR REPAIR  02/25/2012   Procedure: ANTERIOR (CYSTOCELE) AND POSTERIOR REPAIR (RECTOCELE);  Surgeon: Loney Laurence, MD;  Location: [facility];  Service: Gynecology;  Laterality: N/A;  ANterior cystocele repair ONLY   BIOPSY  08/29/2019   Procedure: BIOPSY;  Surgeon: Shellia Cleverly, DO;  Location:  [facility];  Service: Gastroenterology;;   BLADDER SUSPENSION  02/25/2012   Procedure: TRANSVAGINAL TAPE (TVT) PROCEDURE;  Surgeon: Loney Laurence, MD;  Location: [facility];  Service: Gynecology;  Laterality: N/A;   COLECTOMY WITH COLOSTOMY CREATION/HARTMANN PROCEDURE  02/19/2021   Sigmoid colectomy w descnding colon  MF/ostomy   COLONOSCOPY WITH PROPOFOL N/A 08/29/2019   Procedure: COLONOSCOPY WITH PROPOFOL;  Surgeon: Shellia Cleverly, DO;  Location: [facility];  Service: Gastroenterology;  Laterality: N/A;   CYSTOSCOPY  02/25/2012   Procedure: CYSTOSCOPY;  Surgeon: Loney Laurence, MD;  Location: [facility];  Service: Gynecology;  Laterality: N/A;   ILEOCECETOMY  02/19/2021   Dr Oren Bracket @ [facility]   ILEOSTOMY  02/19/2021   [facility] - Dr Oren Bracket   KNEE ARTHROSCOPY  2010   LAPAROSCOPIC ASSISTED VAGINAL HYSTERECTOMY  06/17/2011   Procedure: LAPAROSCOPIC ASSISTED VAGINAL HYSTERECTOMY;  Surgeon: Melony Overly;  Location: [facility];  Service: Gynecology;  Laterality: N/A;  Attempted    MUCUS FISTULA - RECTOSIGMOID  02/19/2021   POLYPECTOMY  08/29/2019   Procedure: POLYPECTOMY;  Surgeon: Shellia Cleverly, DO;  Location: [facility];  Service: Gastroenterology;;   SALPINGOOPHORECTOMY  06/17/2011   Procedure: SALPINGO OOPHERECTOMY;  Surgeon: Melony Overly;  Location: [facility];  Service: Gynecology;  Laterality: Bilateral;   TONSILLECTOMY      Allergies: Nsaids, Ace inhibitors, and Iodine  Medications: Prior to Admission medications   Medication Sig Start Date End Date Taking? Authorizing Provider  diphenhydramine-acetaminophen (TYLENOL PM) 25-500 MG TABS tablet Take 1 tablet by mouth at bedtime as needed.   Yes [provider]  Octreotide Acetate 50 MCG/ML SOSY Inject 1 ml ( ) into the skin every 8 hours. 07/30/22  Yes Ronnald Nian, MD  STELARA 90 MG/ML SOSY injection Inject 90 mg into the skin every 3 (three) months. 01/22/23  Yes [provider]  loperamide (IMODIUM) 2 MG capsule Take 1 capsule (2 mg total) by mouth 3 (three) times daily as needed for diarrhea or loose stools. Patient not taking: Reported on 09/29/2023 07/03/22   Albertine Grates, MD  SYRINGE-NEEDLE, DISP, 3 ML 25G X 5/8" 3 ML MISC Use as directed for octreotide injection 07/03/22   Albertine Grates, MD     Family History  Problem  Relation Age of Onset   Diabetes Mother    Dementia Mother    Clotting disorder Father    Macular degeneration Father    Crohn's disease Sister    Colon cancer Neg Hx    Esophageal cancer Neg Hx    Pancreatic cancer Neg Hx    Stomach cancer Neg Hx    Liver disease Neg Hx     Social History   Socioeconomic History   Marital status: Widowed    Spouse name: Not on file   Number of children: 1   Years of education: Not on file   Highest education level: Not on file  Occupational History   Occupation: Transport planner: [employer]  Tobacco Use   Smoking status: Former    Current packs/day: 0.00    Types: Cigarettes    Quit date: 12/18/2022    Years since quitting: 0.8   Smokeless tobacco: Never   Tobacco comments:    none in 2 weeks as of 12/04/20  Vaping Use   Vaping status: Former  Substance and Sexual Activity   Alcohol use: Not Currently   Drug use: Not Currently    Types: Cocaine, Marijuana  Comment: clean x 8 weeks   Sexual activity: Not Currently  Other Topics Concern   Not on file  Social History Narrative   Lives with boyfriend who she states is an alcoholic   Social Determinants of Health   Financial Resource Strain: Medium Risk (03/20/2021)   Received from [facility]   Overall Financial Resource Strain (CARDIA)    Difficulty of Paying Living Expenses: Somewhat hard  Food Insecurity: Unknown (01/08/2022)   Received from [facility], [facility]   Hunger Vital Sign    Worried About Running Out of Food in the Last Year: Patient declined    Ran Out of Food in the Last Year: Patient declined  Transportation Needs: Not on file  Physical Activity: Not on file  Stress: Stress Concern Present (03/20/2021)   Received from [facility], [facility]   [employer] of Occupational Health - Occupational Stress Questionnaire    Feeling of Stress : To some extent  Social Connections: Unknown (03/05/2022)   [facility]    Social Network    Social Network: Not on file     Review of Systems: A 12 point ROS discussed and pertinent positives are indicated in the HPI above.  All other systems are negative.  Review of Systems  Constitutional:  Positive for chills and fatigue. Negative for fever.  Respiratory:  Positive for cough (productive). Negative for shortness of breath.   Cardiovascular:  Negative for chest pain.  Gastrointestinal:  Positive for abdominal pain, nausea and vomiting.       (+) loose output from ostomy  Genitourinary:  Negative for dysuria and hematuria.  Musculoskeletal:  Negative for back pain.  Neurological:  Negative for dizziness and headaches.    Vital Signs: BP (!) 142/93 (BP Location: Right Arm)   Pulse 96   Temp 98 F (36.7 C) (Oral)   Resp 19   Ht 5\' 4"  (1.626 m)   Wt 130 lb (59 kg)   LMP 06/10/2011 (Exact Date)   SpO2 99%   BMI 22.31 kg/m   Physical Exam Vitals and nursing note reviewed.  Constitutional:      General: She is not in acute distress.    Appearance: She is ill-appearing.     Comments: Tearful  HENT:     Mouth/Throat:     Mouth: Mucous membranes are moist.     Pharynx: Oropharynx is clear. No oropharyngeal exudate or posterior oropharyngeal erythema.  Cardiovascular:     Rate and Rhythm: Regular rhythm. Tachycardia present.  Pulmonary:     Effort: Pulmonary effort is normal.     Breath sounds: Normal breath sounds.  Abdominal:     General: There is no distension.     Palpations: Abdomen is soft.     Tenderness: There is abdominal tenderness.  Skin:    General: Skin is warm and dry.  Neurological:     Mental Status: She is alert and oriented to person, place, and time.  Psychiatric:        Mood and Affect: Mood normal.        Behavior: Behavior normal.        Thought Content: Thought content normal.        Judgment: Judgment normal.      MD Evaluation Airway: WNL Heart: WNL Abdomen: WNL Chest/ Lungs: WNL ASA  Classification:  3 Mallampati/Airway Score: One   Imaging: CT ABDOMEN PELVIS WO CONTRAST  Result Date: 10/09/2023 CLINICAL DATA:  Abdominal pain, nausea and  vomiting, history of Crohn disease EXAM: CT ABDOMEN AND PELVIS WITHOUT CONTRAST TECHNIQUE: Multidetector CT imaging of the abdomen and pelvis was performed following the standard protocol without IV contrast. Unenhanced CT was performed per clinician order. Lack of IV contrast limits sensitivity and specificity, especially for evaluation of abdominal/pelvic solid viscera. RADIATION DOSE REDUCTION: This exam was performed according to the departmental dose-optimization program which includes automated exposure control, adjustment of the mA and/or kV according to patient size and/or use of iterative reconstruction technique. COMPARISON:  07/01/2022 FINDINGS: Lower chest: Subpleural consolidation and volume loss within the medial aspect of the right lower lobe, favor atelectasis and/or scarring. No acute airspace disease. Hepatobiliary: Cholelithiasis without cholecystitis. Unremarkable unenhanced appearance of the liver. Pancreas: Unremarkable unenhanced appearance. Spleen: Unremarkable unenhanced appearance. Adrenals/Urinary Tract: 2.5 cm staghorn type calculus at the left UPJ, with resulting moderate to severe left hydronephrosis. The right kidney is unremarkable. The adrenals and bladder are normal. Stomach/Bowel: Stable postsurgical changes from subtotal colectomy, Hartmann's pouch, right-sided ileostomy, and left-sided mucous fistula. No bowel obstruction or ileus. No bowel wall thickening or inflammatory change. Vascular/Lymphatic: Aortic atherosclerosis. No enlarged abdominal or pelvic lymph nodes. Reproductive: Status post hysterectomy. No adnexal masses. Other: No free fluid or free intraperitoneal gas. No abdominal wall hernia. Musculoskeletal: No acute or destructive bony abnormalities. Reconstructed images demonstrate no additional findings. IMPRESSION: 1.  Obstructing 2.5 cm staghorn calculus at the left UPJ, with moderate to severe left-sided hydronephrosis. 2. Cholelithiasis without cholecystitis. 3. Extensive postsurgical changes of the bowel, stable since prior study. No obstruction or ileus. No inflammatory change. 4. Subpleural consolidation and volume loss within the medial basilar segment of the right lower lobe, likely scarring or atelectasis. 5.  Aortic Atherosclerosis (ICD10-I70.0). Electronically Signed   By: Sharlet Salina M.D.   On: 10/09/2023 22:15    Labs:  CBC: Recent Labs    09/29/23 1226 10/09/23 1956 10/10/23 1128  WBC 13.7* 24.3* 33.0*  HGB 12.9 14.8 14.2  HCT 40.3 45.2 44.3  PLT 313 550* 506*    COAGS: Recent Labs    10/10/23 0034  INR 1.8*    BMP: Recent Labs    09/29/23 1226 10/09/23 1956 10/10/23 1128  NA 143 133* 133*  K 4.5 3.5 4.5  CL 109* 114* 99  CO2 17* 12* 18*  GLUCOSE 84 111* 112*  BUN 25 63* 86*  CALCIUM 9.1 5.4* 9.2  CREATININE 2.08* 3.41* 4.82*  GFRNONAA  --  15* 10*    LIVER FUNCTION TESTS: Recent Labs    09/29/23 1226 10/09/23 1956 10/10/23 1128  BILITOT <0.2 0.7 0.5  AST 11 22 21   ALT 8 10 19   ALKPHOS 89 61 90  PROT 7.4 4.9* 8.4*  ALBUMIN 4.2 2.0* 3.6    TUMOR MARKERS: No results for input(s): "AFPTM", "CEA", "CA199", "CHROMGRNA" in the last 8760 hours.  Assessment and Plan:  62 y/o F who presented to the ED yesterday evening with complaints of general malaise, weakness, abdominal pain, nausea, vomiting and loose ostomy output found to have leukocytosis, AKI and large left UPJ stone with subsequent hydronephrosis. Given worsening renal function and increasing WBC request has been made to IR by urology for left PCN placement for decompression.  Patient history and imaging reviewed by Dr. Archer Asa who approves procedure for today. IR will call for patient when ready  Patient has been NPO since 0400 - remain NPO until post procedure, Lovenox held, she was given one  dose of Rocephin in the ED. Will order additional  dose of Rocephin 2g to be given in IR for drain placement prophylaxis.  Attempted x 2 to call patient's daughter Cyndi Bender per patient's request to provide update on plan of care with no answer, HIPAA compliant voicemail left requesting call back.   Risks and benefits of left PCN placement was discussed with the patient including, but not limited to, infection, bleeding, significant bleeding causing loss or decrease in renal function or damage to adjacent structures.   All of the patient's questions were answered, patient is agreeable to proceed.  Consent signed and in chart.  Thank you for this interesting consult.  I greatly enjoyed meeting KIERAH FRUTOS and look forward to participating in their care.  A copy of this report was sent to the requesting provider on this date.  Electronically Signed: Villa Herb, PA-C 10/10/2023, 12:26 PM   I spent a total of 55 Miinutes in face to face in clinical consultation, greater than 50% of which was counseling/coordinating care for left hydronephrosis.

## 2023-10-10 NOTE — Progress Notes (Signed)
MEDICATION-RELATED CONSULT NOTE   IR Procedure Consult - Anticoagulant/Antiplatelet PTA/Inpatient Med List Review by Pharmacist    Procedure: L PCN tube placement    Completed: 16:24  Post-Procedural bleeding risk per IR MD assessment: STANDARD  Antithrombotic medications on inpatient profile prior to procedure:  enoxaparin 30 mg SQ daily (not started) PTA antithrombotic medications:  none    Recommended restart time per IR Post-Procedure Guidelines:  POD1 AM   Other considerations:  enoxaparin 30 mg SQ daily already ordered to start tomorrow AM   Plan:     Continue Lovenox as ordered  Bernadene Person, PharmD, BCPS [phone] 10/10/2023, 4:32 PM

## 2023-10-10 NOTE — ED Notes (Signed)
ED TO INPATIENT HANDOFF REPORT  Name/Age/Gender Cynthia [name] 62 y.o. female  Code Status    Code Status Orders  (From admission, onward)           Start     Ordered   10/10/23 0900  Full code  Continuous       Question:  By:  Answer:  Consent: discussion documented in EHR   10/10/23 0900           Code Status History     Date Active Date Inactive Code Status Order ID Comments User Context   07/01/2022 2312 07/03/2022 2154 Full Code [id number]  Briscoe Deutscher, MD ED   04/13/2022 1842 04/14/2022 2210 Full Code [id number]  Teddy Spike, DO Inpatient   12/13/2021 0002 12/27/2021 1917 Full Code [id number]  Angie Fava, DO ED   10/17/2021 1334 10/27/2021 1839 Full Code [id number]  Bobette Mo, MD ED   09/26/2021 1925 09/29/2021 2207 Full Code [id number]  Kinsinger, De Blanch, MD ED   08/12/2021 1957 08/20/2021 2322 Full Code [id number]  Synetta Fail, MD ED   07/10/2021 0307 07/14/2021 1924 Full Code [id number]  Marcelle Smiling, MD ED   06/26/2021 0047 06/28/2021 1900 Full Code [id number]  John Giovanni, MD ED   04/05/2021 2131 04/09/2021 1950 Full Code [id number]  Briscoe Deutscher, MD ED   01/25/2021 0031 01/30/2021 1850 Full Code [id number]  Chotiner, Claudean Severance, MD Inpatient   01/08/2021 1300 01/13/2021 1638 Full Code [id number]  Teddy Spike, DO Inpatient   12/04/2020 2304 12/09/2020 1609 Full Code [id number]  Charlsie Quest, MD ED   06/27/2020 1218 07/03/2020 1848 Full Code [id number]  Jerald Kief, MD ED   08/26/2019 0410 08/29/2019 2106 Full Code [id number]  Eduard Clos, MD ED       Home/SNF/Other Home  Chief Complaint Obstruction of left ureteropelvic junction (UPJ) due to stone [N20.1]  Level of Care/Admitting Diagnosis ED Disposition     ED Disposition  Admit   Condition  --   Comment  Hospital Area: [facility] [[id number]]  Level of Care: Med-Surg [16]  May admit patient to [facility] or [facility] if equivalent level of  care is available:: No  Covid Evaluation: Asymptomatic - no recent exposure (last 10 days) testing not required  Diagnosis: Obstruction of left ureteropelvic junction (UPJ) due to stone [1500150]  Admitting Physician: Steffanie Rainwater [[id number]]  Attending Physician: Steffanie Rainwater [[id number]]  Certification:: I certify this patient will need inpatient services for at least 2 midnights  Expected Medical Readiness: 10/13/2023          Medical History 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   GERD (gastroesophageal reflux disease)    Headache(784.0)    MIGRAINE   Heart murmur    Hiatal hernia    History of noncompliance with medical treatment 04/13/2022   History of rectal polyp 2020 01/12/2021   History of serrated colonic polyp 01/13/2011   Hypertension    Incontinence    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HCC)    MVP (mitral valve prolapse)  RLS (restless legs syndrome) 09/25/2014   Sepsis due to cellulitis (HCC) 04/05/2021    Allergies Allergies  Allergen Reactions   Nsaids Other (See Comments)    Crohn's disease & CKD   Ace Inhibitors Cough   Iodine Other (See Comments)    The patient said she had a reaction from iodine in some eye drops- caused eyes to turn red;  pt has no reactions to CT contrast    IV Location/Drains/Wounds Patient Lines/Drains/Airways Status     Active Line/Drains/Airways     Name Placement date Placement time Site Days   Peripheral IV 10/09/23 22 G Anterior;Distal;Left Forearm 10/09/23  1835  Forearm  1   Peripheral IV 10/09/23 22 G 1" Left;Anterior Forearm 10/09/23  2328   Forearm  1   Ileostomy RLQ 09/28/21  0330  RLQ  742   Ileostomy RLQ 12/13/21  --  RLQ  666   Enterocutaneous Fistula 07/02/22  1630  -- 465   Wound / Incision (Open or Dehisced) 04/13/22 Irritant Dermatitis (Moisture Associated Skin Damage) Abdomen Lower;Medial;Mid 04/13/22  1648  Abdomen  545            Labs/Imaging Results for orders placed or performed during the hospital encounter of 10/09/23 (from the past 48 hour(s))  Lipase, blood     Status: None   Collection Time: 10/09/23  7:56 PM  Result Value Ref Range   Lipase 22 11 - 51 U/L    Comment: Performed at [facility], [address] [place], [place] [address]  Comprehensive metabolic panel     Status: Abnormal   Collection Time: 10/09/23  7:56 PM  Result Value Ref Range   Sodium 133 (L) 135 - 145 mmol/L   Potassium 3.5 3.5 - 5.1 mmol/L    Comment: HEMOLYSIS AT THIS LEVEL MAY AFFECT RESULT   Chloride 114 (H) 98 - 111 mmol/L   CO2 12 (L) 22 - 32 mmol/L   Glucose, Bld 111 (H) 70 - 99 mg/dL    Comment: Glucose reference range applies only to samples taken after fasting for at least 8 hours.   BUN 63 (H) 8 - 23 mg/dL   Creatinine, Ser 6.04 (H) 0.44 - 1.00 mg/dL   Calcium 5.4 (LL) 8.9 - 10.3 mg/dL    Comment: CRITICAL RESULT CALLED TO, READ BACK BY AND VERIFIED WITH Clois Dupes RN @ 2116 10/09/23. GILBERTL    Total Protein 4.9 (L) 6.5 - 8.1 g/dL   Albumin 2.0 (L) 3.5 - 5.0 g/dL   AST 22 15 - 41 U/L    Comment: HEMOLYSIS AT THIS LEVEL MAY AFFECT RESULT   ALT 10 0 - 44 U/L    Comment: HEMOLYSIS AT THIS LEVEL MAY AFFECT RESULT   Alkaline Phosphatase 61 38 - 126 U/L   Total Bilirubin 0.7 <1.2 mg/dL    Comment: HEMOLYSIS AT THIS LEVEL MAY AFFECT RESULT   GFR, Estimated 15 (L) >60 mL/min    Comment: (NOTE) Calculated using the CKD-EPI Creatinine Equation (2021)    Anion gap 7 5 - 15    Comment: Performed at [facility], [address] [place], [place] [address]  CBC     Status:  Abnormal   Collection Time: 10/09/23  7:56 PM  Result Value Ref Range   WBC 24.3 (H) 4.0 - 10.5 K/uL   RBC 5.04 3.87 - 5.11 MIL/uL   Hemoglobin 14.8 12.0 - 15.0 g/dL   HCT 11.9 14.7 - 82.9 %  MCV 89.7 80.0 - 100.0 fL   MCH 29.4 26.0 - 34.0 pg   MCHC 32.7 30.0 - 36.0 g/dL   RDW 46.9 62.9 - 52.8 %   Platelets 550 (H) 150 - 400 K/uL   nRBC 0.0 0.0 - 0.2 %    Comment: Performed at [facility], [address] [place], [place] [address]  Magnesium     Status: Abnormal   Collection Time: 10/09/23  7:56 PM  Result Value Ref Range   Magnesium 1.3 (L) 1.7 - 2.4 mg/dL    Comment: Performed at [facility], [address], [place], [place] [address]  Urinalysis, Routine w reflex microscopic -Urine, Clean Catch     Status: Abnormal   Collection Time: 10/09/23 10:22 PM  Result Value Ref Range   Color, Urine YELLOW YELLOW   APPearance CLOUDY (A) CLEAR   Specific Gravity, Urine 1.016 1.005 - 1.030   pH 5.0 5.0 - 8.0   Glucose, UA NEGATIVE NEGATIVE mg/dL   Hgb urine dipstick MODERATE (A) NEGATIVE   Bilirubin Urine NEGATIVE NEGATIVE   Ketones, ur NEGATIVE NEGATIVE mg/dL   Protein, ur 725 (A) NEGATIVE mg/dL   Nitrite NEGATIVE NEGATIVE   Leukocytes,Ua LARGE (A) NEGATIVE   RBC / HPF 21-50 0 - 5 RBC/hpf   WBC, UA >50 0 - 5 WBC/hpf   Bacteria, UA NONE SEEN NONE SEEN   Squamous Epithelial / HPF 0-5 0 - 5 /HPF   WBC Clumps PRESENT     Comment: Performed at [facility], [address], [place], [place] [address]  Culture, blood (routine x 2)     Status: None (Preliminary result)   Collection Time: 10/09/23 10:50 PM   Specimen: BLOOD LEFT FOREARM  Result Value Ref Range   Specimen Description      BLOOD LEFT FOREARM Performed at [facility], [address], [place], [place] [address]    Special Requests      BOTTLES DRAWN AEROBIC AND ANAEROBIC Blood Culture results may not be optimal due to an inadequate volume of  blood received in culture bottles Performed at [facility], [address], [place], [place] [address]    Culture      NO GROWTH < 12 HOURS Performed at [facility], [address] [place], [place] [address]    Report Status PENDING   Culture, blood (routine x 2)     Status: None (Preliminary result)   Collection Time: 10/09/23 11:30 PM   Specimen: BLOOD LEFT FOREARM  Result Value Ref Range   Specimen Description      BLOOD LEFT FOREARM Performed at [facility], [address], [place], [place] [address]    Special Requests      BOTTLES DRAWN AEROBIC AND ANAEROBIC Blood Culture results may not be optimal due to an inadequate volume of blood received in culture bottles Performed at [facility], [address] [place], [place] [address]    Culture      NO GROWTH < 12 HOURS Performed at [facility], [address] [place], [place] [address]    Report Status PENDING   Protime-INR     Status: Abnormal   Collection Time: 10/10/23 12:34 AM  Result Value Ref Range   Prothrombin Time 20.7 (H) 11.4 - 15.2 seconds   INR 1.8 (H) 0.8 - 1.2    Comment: (NOTE) INR goal varies based on device and disease  states. Performed at [facility], [address] [place], [place] [address]   I-Stat Lactic Acid     Status: Abnormal   Collection Time: 10/10/23 12:40 AM  Result Value Ref Range   Lactic Acid, Venous <0.3 (L) 0.5 - 1.9 mmol/L   CT ABDOMEN PELVIS WO CONTRAST  Result Date: 10/09/2023 CLINICAL DATA:  Abdominal pain, nausea and vomiting, history of Crohn disease EXAM: CT ABDOMEN AND PELVIS WITHOUT CONTRAST TECHNIQUE: Multidetector CT imaging of the abdomen and pelvis was performed following the standard protocol without IV contrast. Unenhanced CT was performed per clinician order. Lack of IV contrast limits sensitivity and specificity, especially for evaluation of abdominal/pelvic solid viscera.  RADIATION DOSE REDUCTION: This exam was performed according to the departmental dose-optimization program which includes automated exposure control, adjustment of the mA and/or kV according to patient size and/or use of iterative reconstruction technique. COMPARISON:  07/01/2022 FINDINGS: Lower chest: Subpleural consolidation and volume loss within the medial aspect of the right lower lobe, favor atelectasis and/or scarring. No acute airspace disease. Hepatobiliary: Cholelithiasis without cholecystitis. Unremarkable unenhanced appearance of the liver. Pancreas: Unremarkable unenhanced appearance. Spleen: Unremarkable unenhanced appearance. Adrenals/Urinary Tract: 2.5 cm staghorn type calculus at the left UPJ, with resulting moderate to severe left hydronephrosis. The right kidney is unremarkable. The adrenals and bladder are normal. Stomach/Bowel: Stable postsurgical changes from subtotal colectomy, Hartmann's pouch, right-sided ileostomy, and left-sided mucous fistula. No bowel obstruction or ileus. No bowel wall thickening or inflammatory change. Vascular/Lymphatic: Aortic atherosclerosis. No enlarged abdominal or pelvic lymph nodes. Reproductive: Status post hysterectomy. No adnexal masses. Other: No free fluid or free intraperitoneal gas. No abdominal wall hernia. Musculoskeletal: No acute or destructive bony abnormalities. Reconstructed images demonstrate no additional findings. IMPRESSION: 1. Obstructing 2.5 cm staghorn calculus at the left UPJ, with moderate to severe left-sided hydronephrosis. 2. Cholelithiasis without cholecystitis. 3. Extensive postsurgical changes of the bowel, stable since prior study. No obstruction or ileus. No inflammatory change. 4. Subpleural consolidation and volume loss within the medial basilar segment of the right lower lobe, likely scarring or atelectasis. 5.  Aortic Atherosclerosis (ICD10-I70.0). Electronically Signed   By: Sharlet Salina M.D.   On: 10/09/2023 22:15     Pending Labs Unresulted Labs (From admission, onward)     Start     Ordered   10/10/23 0937  CBC  Once,   R        10/10/23 0936   10/10/23 0934  Magnesium  Once,   R        10/10/23 0936   10/10/23 0934  Phosphorus  Once,   R        10/10/23 0936   10/10/23 0933  Comprehensive metabolic panel  Once,   R        10/10/23 0936   10/10/23 0856  HIV Antibody (routine testing w rflx)  (HIV Antibody (Routine testing w reflex) panel)  Once,   R        10/10/23 0900   10/09/23 2222  Urine Culture  Once,   URGENT       Question:  Indication  Answer:  Sepsis   10/09/23 2222            Vitals/Pain Today's Vitals   10/10/23 0630 10/10/23 0800 10/10/23 0805 10/10/23 0810  BP: (!) 141/94  (!) 140/101   Pulse: (!) 103  (!) 106 (!) 102  Resp: 18  20   Temp:   98 F (36.7 C)   TempSrc:  Oral   SpO2: 98%  99% 100%  Weight:      Height:      PainSc:  8   8     Isolation Precautions No active isolations  Medications Medications  0.9 %  sodium chloride infusion ( Intravenous New Bag/Given 10/10/23 0007)  enoxaparin (LOVENOX) injection 30 mg (has no administration in time range)  acetaminophen (TYLENOL) tablet 650 mg (has no administration in time range)    Or  acetaminophen (TYLENOL) suppository 650 mg (has no administration in time range)  senna-docusate (Senokot-S) tablet 1 tablet (has no administration in time range)  ondansetron (ZOFRAN) tablet 4 mg (has no administration in time range)    Or  ondansetron (ZOFRAN) injection 4 mg (has no administration in time range)  octreotide (SANDOSTATIN) injection 50 mcg (has no administration in time range)  0.9 %  sodium chloride infusion (has no administration in time range)  HYDROmorphone (DILAUDID) injection 0.5 mg (has no administration in time range)  sodium chloride 0.9 % bolus 1,000 mL (1,000 mLs Intravenous Bolus 10/09/23 1947)  ondansetron (ZOFRAN) injection 4 mg (4 mg Intravenous Given 10/09/23 1948)  morphine (PF) 4 MG/ML  injection 4 mg (4 mg Intravenous Given 10/09/23 1948)  magnesium sulfate IVPB 2 g 50 mL (0 g Intravenous Stopped 10/09/23 2335)  calcium gluconate 1 g/ 50 mL sodium chloride IVPB (0 mg Intravenous Stopped 10/09/23 2335)  cefTRIAXone (ROCEPHIN) 2 g in sodium chloride 0.9 % 100 mL IVPB (0 g Intravenous Stopped 10/10/23 0121)  ondansetron (ZOFRAN) injection 4 mg (4 mg Intravenous Given 10/10/23 0222)  ondansetron (ZOFRAN) injection 4 mg (4 mg Intravenous Given 10/10/23 0806)    Mobility walks with person assist

## 2023-10-10 NOTE — Procedures (Signed)
Interventional Radiology Procedure Note  Procedure: Placement of a LEFT sided percutaneous nephrostomy tube  Complications: None  Estimated Blood Loss: None  Recommendations: - Drain to bag   Signed,  Sterling Big, MD

## 2023-10-10 NOTE — Plan of Care (Signed)
TBD

## 2023-10-11 DIAGNOSIS — N179 Acute kidney failure, unspecified: Secondary | ICD-10-CM | POA: Diagnosis not present

## 2023-10-11 DIAGNOSIS — A419 Sepsis, unspecified organism: Secondary | ICD-10-CM

## 2023-10-11 DIAGNOSIS — N3001 Acute cystitis with hematuria: Secondary | ICD-10-CM | POA: Diagnosis not present

## 2023-10-11 DIAGNOSIS — N201 Calculus of ureter: Secondary | ICD-10-CM | POA: Diagnosis not present

## 2023-10-11 DIAGNOSIS — R799 Abnormal finding of blood chemistry, unspecified: Secondary | ICD-10-CM

## 2023-10-11 DIAGNOSIS — N1832 Chronic kidney disease, stage 3b: Secondary | ICD-10-CM

## 2023-10-11 LAB — BLOOD CULTURE ID PANEL (REFLEXED) - BCID2

## 2023-10-11 LAB — CBC WITH DIFFERENTIAL/PLATELET
Abs Immature Granulocytes: 0.39 10*3/uL — ABNORMAL HIGH (ref 0.00–0.07)
Basophils Absolute: 0.1 10*3/uL (ref 0.0–0.1)
Basophils Relative: 0 %
Eosinophils Absolute: 0 10*3/uL (ref 0.0–0.5)
Eosinophils Relative: 0 %
HCT: 38.2 % (ref 36.0–46.0)
Hemoglobin: 11.7 g/dL — ABNORMAL LOW (ref 12.0–15.0)
Immature Granulocytes: 2 %
Lymphocytes Relative: 11 %
Lymphs Abs: 2 10*3/uL (ref 0.7–4.0)
MCH: 29 pg (ref 26.0–34.0)
MCHC: 30.6 g/dL (ref 30.0–36.0)
MCV: 94.8 fL (ref 80.0–100.0)
Monocytes Absolute: 0.9 10*3/uL (ref 0.1–1.0)
Monocytes Relative: 5 %
Neutro Abs: 15.4 10*3/uL — ABNORMAL HIGH (ref 1.7–7.7)
Neutrophils Relative %: 82 %
Platelets: 358 10*3/uL (ref 150–400)
RBC: 4.03 MIL/uL (ref 3.87–5.11)
RDW: 12.5 % (ref 11.5–15.5)
WBC: 18.7 10*3/uL — ABNORMAL HIGH (ref 4.0–10.5)
nRBC: 0 % (ref 0.0–0.2)

## 2023-10-11 LAB — BASIC METABOLIC PANEL
Anion gap: 9 (ref 5–15)
BUN: 64 mg/dL — ABNORMAL HIGH (ref 8–23)
CO2: 17 mmol/L — ABNORMAL LOW (ref 22–32)
Calcium: 8.1 mg/dL — ABNORMAL LOW (ref 8.9–10.3)
Chloride: 104 mmol/L (ref 98–111)
Creatinine, Ser: 3.23 mg/dL — ABNORMAL HIGH (ref 0.44–1.00)
GFR, Estimated: 16 mL/min — ABNORMAL LOW (ref >60)
Glucose, Bld: 91 mg/dL (ref 70–99)
Potassium: 4 mmol/L (ref 3.5–5.1)
Sodium: 130 mmol/L — ABNORMAL LOW (ref 135–145)

## 2023-10-11 LAB — MAGNESIUM: Magnesium: 2.4 mg/dL (ref 1.7–2.4)

## 2023-10-11 MED ORDER — PROCHLORPERAZINE EDISYLATE 10 MG/2ML IJ SOLN
5.0000 mg | Freq: Once | INTRAMUSCULAR | Status: AC | PRN
  Administered 2023-10-11: 5 mg via INTRAVENOUS
  Filled 2023-10-11: qty 2

## 2023-10-11 MED ORDER — MELATONIN 5 MG PO TABS
5.0000 mg | ORAL_TABLET | Freq: Every evening | ORAL | Status: AC | PRN
Start: 2023-10-11 — End: 2023-10-13
  Administered 2023-10-11 – 2023-10-13 (×3): 5 mg via ORAL
  Filled 2023-10-11 (×3): qty 1

## 2023-10-11 MED ORDER — SENNOSIDES-DOCUSATE SODIUM 8.6-50 MG PO TABS
1.0000 | ORAL_TABLET | Freq: Two times a day (BID) | ORAL | Status: DC
  Administered 2023-10-12: 1 via ORAL
  Filled 2023-10-11 (×5): qty 1

## 2023-10-11 MED ORDER — ALUM & MAG HYDROXIDE-SIMETH 200-200-20 MG/5ML PO SUSP
30.0000 mL | Freq: Four times a day (QID) | ORAL | Status: DC | PRN
  Administered 2023-10-11 – 2023-10-13 (×3): 30 mL via ORAL
  Filled 2023-10-11 (×3): qty 30

## 2023-10-11 NOTE — Assessment & Plan Note (Signed)
-   No home meds noted on med rec.  Blood pressure currently controlled

## 2023-10-11 NOTE — Assessment & Plan Note (Signed)
-   No chronic medications noted

## 2023-10-11 NOTE — Assessment & Plan Note (Signed)
-   Tachycardia, leukocytosis, urinary source from obstructed staghorn calculus - Continue Rocephin; see UTI

## 2023-10-11 NOTE — Assessment & Plan Note (Signed)
-   from admission 2/2 bottles (1 set) growing MRSE and staph hominis and 2nd set remains negative -Suspected contamination given polymicrobial  - Repeat blood cultures on 10/11/2023 and monitor; so far negative x 48 hours

## 2023-10-11 NOTE — Progress Notes (Signed)
PHARMACY - PHYSICIAN COMMUNICATION CRITICAL VALUE ALERT - BLOOD CULTURE IDENTIFICATION (BCID)  Cynthia [name] is an 62 y.o. female who presented to [facility] on 10/09/2023 with a chief complaint of nausea, concern for Crohn's flare-up.   Assessment:  Admit with obstructive kidney stone & acute cystitis/infection. S/p L PCN.  Currently on Rocephin.  Blood cx now + methicillin resistant staph epidermidis. Urine cx has not resulted.  She is afebrile, hemodynamically stable, WBC increased to 33. Possible contaminant vs. True infection.    Name of physician (or Provider) Contacted: Johann Capers, NP  Current antibiotics: Rocephin  Changes to prescribed antibiotics recommended:   Since pt clinically improving with poor renal fxn-->will defer treatment for now.   -Consider repeat blood cx -Low threshold to add IV Vancomycin if 2nd  blood set also growing GPC or pt clinically worsens   Results for orders placed or performed during the hospital encounter of 10/09/23  Blood Culture ID Panel (Reflexed) (Collected: 10/09/2023 10:50 PM)  Result Value Ref Range   Enterococcus faecalis NOT DETECTED NOT DETECTED   Enterococcus Faecium NOT DETECTED NOT DETECTED   Listeria monocytogenes NOT DETECTED NOT DETECTED   Staphylococcus species DETECTED (A) NOT DETECTED   Staphylococcus aureus (BCID) NOT DETECTED NOT DETECTED   Staphylococcus epidermidis DETECTED (A) NOT DETECTED   Staphylococcus lugdunensis NOT DETECTED NOT DETECTED   Streptococcus species NOT DETECTED NOT DETECTED   Streptococcus agalactiae NOT DETECTED NOT DETECTED   Streptococcus pneumoniae NOT DETECTED NOT DETECTED   Streptococcus pyogenes NOT DETECTED NOT DETECTED   A.calcoaceticus-baumannii NOT DETECTED NOT DETECTED   Bacteroides fragilis NOT DETECTED NOT DETECTED   Enterobacterales NOT DETECTED NOT DETECTED   Enterobacter cloacae complex NOT DETECTED NOT DETECTED   Escherichia coli NOT DETECTED NOT DETECTED   Klebsiella aerogenes  NOT DETECTED NOT DETECTED   Klebsiella oxytoca NOT DETECTED NOT DETECTED   Klebsiella pneumoniae NOT DETECTED NOT DETECTED   Proteus species NOT DETECTED NOT DETECTED   Salmonella species NOT DETECTED NOT DETECTED   Serratia marcescens NOT DETECTED NOT DETECTED   Haemophilus influenzae NOT DETECTED NOT DETECTED   Neisseria meningitidis NOT DETECTED NOT DETECTED   Pseudomonas aeruginosa NOT DETECTED NOT DETECTED   Stenotrophomonas maltophilia NOT DETECTED NOT DETECTED   Candida albicans NOT DETECTED NOT DETECTED   Candida auris NOT DETECTED NOT DETECTED   Candida glabrata NOT DETECTED NOT DETECTED   Candida krusei NOT DETECTED NOT DETECTED   Candida parapsilosis NOT DETECTED NOT DETECTED   Candida tropicalis NOT DETECTED NOT DETECTED   Cryptococcus neoformans/gattii NOT DETECTED NOT DETECTED   Methicillin resistance mecA/C DETECTED (A) NOT DETECTED    Junita Push PharmD 10/11/2023  12:50 AM

## 2023-10-11 NOTE — Progress Notes (Signed)
Chief Complaint: Patient was seen today for (L)  Supervising Physician: Malachy Moan  Patient Status: [facility] - In-pt  Subjective: S/p (L)PXN placement yesterday for hydro secondary to obstructing stone Pt feeling okay. Some soreness at drain site.  Objective: Physical Exam: BP 113/74 (BP Location: Left Arm)   Pulse 69   Temp (!) 97.5 F (36.4 C) (Oral)   Resp 14   Ht 5\' 4"  (1.626 m)   Wt 130 lb (59 kg)   LMP 06/10/2011 (Exact Date)   SpO2 100%   BMI 22.31 kg/m  Drain intact, output clear urine Site clean, dry   Current Facility-Administered Medications:    acetaminophen (TYLENOL) tablet 650 mg, 650 mg, Oral, Q6H PRN, 650 mg at 10/10/23 1804 **OR** acetaminophen (TYLENOL) suppository 650 mg, 650 mg, Rectal, Q6H PRN, Steffanie Rainwater, MD   cefTRIAXone (ROCEPHIN) 2 g in sodium chloride 0.9 % 100 mL IVPB, 2 g, Intravenous, Q24H, Steffanie Rainwater, MD, Stopped at 10/10/23 2327   enoxaparin (LOVENOX) injection 30 mg, 30 mg, Subcutaneous, Q24H, Kirke Corin, Flossie Buffy, MD, 30 mg at 10/11/23 0836   HYDROmorphone (DILAUDID) injection 0.5 mg, 0.5 mg, Intravenous, Q4H PRN, Steffanie Rainwater, MD, 0.5 mg at 10/11/23 0402   loperamide (IMODIUM) capsule 2 mg, 2 mg, Oral, TID PRN, Steffanie Rainwater, MD   octreotide (SANDOSTATIN) injection 50 mcg, 50 mcg, Subcutaneous, Q8H, Steffanie Rainwater, MD, 50 mcg at 10/11/23 0621   ondansetron (ZOFRAN) tablet 4 mg, 4 mg, Oral, Q6H PRN **OR** ondansetron (ZOFRAN) injection 4 mg, 4 mg, Intravenous, Q6H PRN, Steffanie Rainwater, MD, 4 mg at 10/11/23 0913   senna-docusate (Senokot-S) tablet 1 tablet, 1 tablet, Oral, QHS PRN, Steffanie Rainwater, MD  Labs: CBC Recent Labs    10/09/23 1956 10/10/23 1128  WBC 24.3* 33.0*  HGB 14.8 14.2  HCT 45.2 44.3  PLT 550* 506*   BMET Recent Labs    10/09/23 1956 10/10/23 1128  NA 133* 133*  K 3.5 4.5  CL 114* 99  CO2 12* 18*  GLUCOSE 111* 112*  BUN 63* 86*  CREATININE 3.41* 4.82*   CALCIUM 5.4* 9.2   LFT Recent Labs    10/09/23 1956 10/10/23 1128  PROT 4.9* 8.4*  ALBUMIN 2.0* 3.6  AST 22 21  ALT 10 19  ALKPHOS 61 90  BILITOT 0.7 0.5  LIPASE 22  --    PT/INR Recent Labs    10/10/23 0034  LABPROT 20.7*  INR 1.8*     Studies/Results: IR NEPHROSTOMY PLACEMENT LEFT  Result Date: 10/10/2023 INDICATION: 61 year with new left staghorn calculus and hydronephrosis. EXAM: IR NEPHROSTOMY PLACEMENT LEFT COMPARISON:  None Available. MEDICATIONS: In patient already receiving intravenous antibiotics. No additional prophylaxis was administered. ANESTHESIA/SEDATION: Fentanyl 2 mcg IV; Versed 100 mg IV administered by the radiology nurse Moderate Sedation Time:  10 minutes The patient's vital signs and level consciousness were continuously monitored during the procedure by the interventional radiology nurse under my direct supervision. CONTRAST:  15mL OMNIPAQUE IOHEXOL 300 MG/ML SOLN - administered into the collecting system(s) FLUOROSCOPY: Radiation exposure index: 8 mGy reference air kerma COMPLICATIONS: None immediate. TECHNIQUE: The procedure, risks, benefits, and alternatives were explained to the patient. Questions regarding the procedure were encouraged and answered. The patient understands and consents to the procedure. The left flank was prepped with chlorhexidine in a sterile fashion, and a sterile drape was applied covering the operative field. A sterile gown and sterile gloves were used for the procedure. Local anesthesia  was provided with 1% Lidocaine. The left flank was interrogated with ultrasound and the left kidney identified. The kidney is hydronephrotic. A suitable access site on the skin overlying the lower pole, posterior calix was identified. After local mg anesthesia was achieved, a small skin nick was made with an 11 blade scalpel. A 21 gauge Accustick needle was then advanced under direct sonographic guidance into the lower pole of the left kidney. A 0.018  inch wire was advanced under fluoroscopic guidance into the left renal collecting system. The Accustick sheath was then advanced over the wire and a 0.018 system exchanged for a 0.035 system. Gentle hand injection of contrast material confirms placement of the sheath within the renal collecting system. There is hydronephrosis. Greater than expected filling defect within the renal pelvis and lower pole infundibulum. The tract from the scan into the renal collecting system was then dilated serially to 10-French. A 10-French Cook all-purpose drain was then placed and positioned under fluoroscopic guidance. The locking loop is well formed within the left renal pelvis. The catheter was secured to the skin with 0 Prolene and a sterile bandage was placed. Catheter was left to gravity bag drainage. IMPRESSION: Successful placement of a left 10 French percutaneous nephrostomy tube. Of note, the filling defect within the renal pelvis and lower pole infundibulum and calices is greater than expected based on the CT images from yesterday. This suggests that a large component of the stone burden is radiolucent, or that there is underlying soft tissue mass or blood products/thrombus as well as the visible radiopaque stone. Electronically Signed   By: Malachy Moan M.D.   On: 10/10/2023 16:24   CT ABDOMEN PELVIS WO CONTRAST  Result Date: 10/09/2023 CLINICAL DATA:  Abdominal pain, nausea and vomiting, history of Crohn disease EXAM: CT ABDOMEN AND PELVIS WITHOUT CONTRAST TECHNIQUE: Multidetector CT imaging of the abdomen and pelvis was performed following the standard protocol without IV contrast. Unenhanced CT was performed per clinician order. Lack of IV contrast limits sensitivity and specificity, especially for evaluation of abdominal/pelvic solid viscera. RADIATION DOSE REDUCTION: This exam was performed according to the departmental dose-optimization program which includes automated exposure control, adjustment of the  mA and/or kV according to patient size and/or use of iterative reconstruction technique. COMPARISON:  07/01/2022 FINDINGS: Lower chest: Subpleural consolidation and volume loss within the medial aspect of the right lower lobe, favor atelectasis and/or scarring. No acute airspace disease. Hepatobiliary: Cholelithiasis without cholecystitis. Unremarkable unenhanced appearance of the liver. Pancreas: Unremarkable unenhanced appearance. Spleen: Unremarkable unenhanced appearance. Adrenals/Urinary Tract: 2.5 cm staghorn type calculus at the left UPJ, with resulting moderate to severe left hydronephrosis. The right kidney is unremarkable. The adrenals and bladder are normal. Stomach/Bowel: Stable postsurgical changes from subtotal colectomy, Hartmann's pouch, right-sided ileostomy, and left-sided mucous fistula. No bowel obstruction or ileus. No bowel wall thickening or inflammatory change. Vascular/Lymphatic: Aortic atherosclerosis. No enlarged abdominal or pelvic lymph nodes. Reproductive: Status post hysterectomy. No adnexal masses. Other: No free fluid or free intraperitoneal gas. No abdominal wall hernia. Musculoskeletal: No acute or destructive bony abnormalities. Reconstructed images demonstrate no additional findings. IMPRESSION: 1. Obstructing 2.5 cm staghorn calculus at the left UPJ, with moderate to severe left-sided hydronephrosis. 2. Cholelithiasis without cholecystitis. 3. Extensive postsurgical changes of the bowel, stable since prior study. No obstruction or ileus. No inflammatory change. 4. Subpleural consolidation and volume loss within the medial basilar segment of the right lower lobe, likely scarring or atelectasis. 5.  Aortic Atherosclerosis (ICD10-I70.0). Electronically Signed  By: Sharlet Salina M.D.   On: 10/09/2023 22:15    Assessment/Plan: (L)hydro S/p left PCN Good output. If/when pt discharged, IR will coordinate follow up PCN exchange in 6-8 weeks unless definitive urologic plan made  prior to that time.    LOS: 1 day   I spent a total of 20 minutes in face to face in clinical consultation, greater than 50% of which was counseling/coordinating care for (L)PCN   Brayton El PA-C 10/11/2023 11:12 AM

## 2023-10-11 NOTE — Assessment & Plan Note (Addendum)
-   Ileostomy bag in place with some stool and air appreciated -Continue home octreotide

## 2023-10-11 NOTE — Plan of Care (Signed)

## 2023-10-11 NOTE — Plan of Care (Signed)

## 2023-10-11 NOTE — Assessment & Plan Note (Signed)
-   No home meds noted on med rec

## 2023-10-11 NOTE — Assessment & Plan Note (Signed)
-   patient has history of CKD3b. Baseline creat ~ 1.5, eGFR~ 40 - patient presents with increase in creat >0.3 mg/dL above baseline or creat increase >1.5x baseline presumed to have occurred within past 7 days PTA -Creatinine 3.41 on admission and peaked at 4.82 - Etiology suspected due to bladder outlet obstruction from staghorn calculus causing hydronephrosis - Renal function has improved after obstruction relief; continue trending for further improvement  - S/p fluids on admission -Continue oral intake for now - BMP in am

## 2023-10-11 NOTE — Progress Notes (Signed)
Progress Note    Cynthia [name]   HQI:[id number]  DOB: 23-Nov-1960  DOA: 10/09/2023     1 PCP: Ronnald Nian, MD  Initial CC: Weakness and fatigue, abdominal pain  Hospital Course: Cynthia [name] is a 62 y.o. female with medical history significant of Crohn's disease s/p ileocolectomy, sigmoid colectomy with an ileostomy, recurrent UTIs, anxiety and depression, HTN and CKD 3B. She presented with generalized weakness and not feeling well for at least a week prior to admission. She has had ongoing nausea and vomiting with right sided flank and back pain. She initially had loose stools from her ostomy bag but now has decreased output due to recent poor p.o. intake.  She reports mild abdominal cramps but denies chest pain, cough, dysuria hematemesis, hematuria, shortness of breath or bloody stools. CT abdomen/pelvis was performed on admission which showed obstructing 2.5 cm staghorn calculus at the left UPJ with moderate to severe left-sided hydronephrosis.  She was evaluated by urology on admission and recommended for left-sided percutaneous nephrostomy tube placement.  This was performed with IR on 10/10/2023.  Interval History:  Resting comfortably in her bed this morning.  Awaiting diet advancement and asking for solid foods.  Feeling better overall but still some generalized malaise.  Understands we are awaiting further culture results and monitoring renal function.  Assessment and Plan: * Obstruction of left ureteropelvic junction (UPJ) due to stone - Patient presented with abdominal pain, weakness, and generalized malaise - CT showing obstructing 2.5 cm staghorn calculus at the left UPJ with moderate to severe left-sided hydronephrosis - Patient underwent left nephrostomy tube placement on 10/10/2023 with IR -Percutaneous nephrostomy tube exchange in 6 to 8 weeks per IR  Sepsis secondary to UTI (HCC) - Tachycardia, leukocytosis, urinary source from obstructed staghorn  calculus - Continue Rocephin and follow-up cultures  Acute cystitis with hematuria - UA noted with large LE, negative nitrite, greater than 50 WBC - Suspected UTI from staghorn calculus - Continue Rocephin - Follow-up urine culture  Acute renal failure superimposed on stage 3b chronic kidney disease (HCC) - patient has history of CKD3b. Baseline creat ~ 1.5, eGFR~ 40 - patient presents with increase in creat >0.3 mg/dL above baseline or creat increase >1.5x baseline presumed to have occurred within past 7 days PTA -Creatinine 3.41 on admission and peaked at 4.82 - Etiology suspected due to bladder outlet obstruction from staghorn calculus causing hydronephrosis - Renal function has improved after obstruction relief - S/p fluids on admission -Continue oral intake for now   Contamination of blood culture - from admission 2/2 bottles (1 set) growing MRSE and 2nd set remains negative -Suspected contamination for now - Repeat blood cultures on 10/11/2023 and monitor  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  Blood pressure currently controlled  Gastroesophageal reflux disease - No home meds noted on med rec   Old records reviewed in assessment of this patient  Antimicrobials: Rocephin 10/10/2023 >> current  DVT prophylaxis:  enoxaparin (LOVENOX) injection 30 mg Start: 10/10/23 1000   Code Status:   Code Status: Full Code  Mobility Assessment (Last 72 Hours)     Mobility Assessment     Row Name 10/10/23 2000 10/10/23 1137         Does patient have an  order for bedrest or is patient medically unstable No - Continue assessment No - Continue assessment      What is the highest level of mobility based on the  progressive mobility assessment? Level 6 (Walks independently in room and hall) - Balance while walking in room without assist - Complete Level 6 (Walks independently in room and hall) - Balance while walking in room without assist - Complete               Barriers to discharge: none Disposition Plan:  Home Status is: Inpt  Objective: Blood pressure 113/74, pulse 69, temperature (!) 97.5 F (36.4 C), temperature source Oral, resp. rate 14, height 5\' 4"  (1.626 m), weight 59 kg, last menstrual period 06/10/2011, SpO2 100%.  Examination:  Physical Exam Constitutional:      Appearance: Normal appearance.  HENT:     Head: Normocephalic and atraumatic.     Mouth/Throat:     Mouth: Mucous membranes are moist.  Eyes:     Extraocular Movements: Extraocular movements intact.  Cardiovascular:     Rate and Rhythm: Normal rate and regular rhythm.  Pulmonary:     Effort: Pulmonary effort is normal. No respiratory distress.     Breath sounds: Normal breath sounds. No wheezing.  Abdominal:     General: Bowel sounds are normal. There is no distension.     Palpations: Abdomen is soft.     Tenderness: There is no abdominal tenderness.     Comments: Ileostomy bag in place  Musculoskeletal:        General: Normal range of motion.     Cervical back: Normal range of motion and neck supple.  Skin:    General: Skin is warm and dry.  Neurological:     General: No focal deficit present.     Mental Status: She is alert.  Psychiatric:        Mood and Affect: Mood normal.      Consultants:  Urology IR  Procedures:  10/10/2023: Left nephrostomy tube placement  Data Reviewed: Results for orders placed or performed during the hospital encounter of 10/09/23 (from the past 24 hour(s))  Basic metabolic panel     Status: Abnormal   Collection Time: 10/11/23 10:15 AM  Result Value Ref Range   Sodium 130 (L) 135 - 145 mmol/L   Potassium 4.0 3.5 - 5.1 mmol/L   Chloride 104 98 - 111 mmol/L   CO2  17 (L) 22 - 32 mmol/L   Glucose, Bld 91 70 - 99 mg/dL   BUN 64 (H) 8 - 23 mg/dL   Creatinine, Ser 1.91 (H) 0.44 - 1.00 mg/dL   Calcium 8.1 (L) 8.9 - 10.3 mg/dL   GFR, Estimated 16 (L) >60 mL/min   Anion gap 9 5 - 15  CBC with Differential/Platelet     Status: Abnormal   Collection Time: 10/11/23 10:15 AM  Result Value Ref Range   WBC 18.7 (H) 4.0 - 10.5 K/uL   RBC 4.03 3.87 - 5.11 MIL/uL   Hemoglobin 11.7 (L) 12.0 - 15.0 g/dL   HCT 47.8 29.5 - 62.1 %   MCV 94.8 80.0 - 100.0 fL   MCH 29.0 26.0 - 34.0 pg   MCHC 30.6 30.0 - 36.0 g/dL   RDW 30.8 65.7 - 84.6 %   Platelets 358 150 - 400 K/uL   nRBC 0.0 0.0 - 0.2 %   Neutrophils Relative % 82 %   Neutro Abs 15.4 (H) 1.7 - 7.7 K/uL  Lymphocytes Relative 11 %   Lymphs Abs 2.0 0.7 - 4.0 K/uL   Monocytes Relative 5 %   Monocytes Absolute 0.9 0.1 - 1.0 K/uL   Eosinophils Relative 0 %   Eosinophils Absolute 0.0 0.0 - 0.5 K/uL   Basophils Relative 0 %   Basophils Absolute 0.1 0.0 - 0.1 K/uL   Immature Granulocytes 2 %   Abs Immature Granulocytes 0.39 (H) 0.00 - 0.07 K/uL  Magnesium     Status: None   Collection Time: 10/11/23 10:15 AM  Result Value Ref Range   Magnesium 2.4 1.7 - 2.4 mg/dL    I have reviewed pertinent nursing notes, vitals, labs, and images as necessary. I have ordered labwork to follow up on as indicated.  I have reviewed the last notes from staff over past 24 hours. I have discussed patient's care plan and test results with nursing staff, CM/SW, and other staff as appropriate.  Time spent: Greater than 50% of the 55 minute visit was spent in counseling/coordination of care for the patient as laid out in the A&P.   LOS: 1 day   Lewie Chamber, MD [facility] 10/11/2023, 1:00 PM

## 2023-10-11 NOTE — Assessment & Plan Note (Signed)
-   UA noted with large LE, negative nitrite, greater than 50 WBC - Suspected UTI from staghorn calculus - Continue Rocephin - urine culture growing Proteus; follow up for sensitivities

## 2023-10-11 NOTE — Hospital Course (Signed)
Cynthia [name] is a 62 y.o. female with medical history significant of Crohn's disease s/p ileocolectomy, sigmoid colectomy with an ileostomy, recurrent UTIs, anxiety and depression, HTN and CKD 3B. She presented with generalized weakness and not feeling well for at least a week prior to admission. She has had ongoing nausea and vomiting with right sided flank and back pain. She initially had loose stools from her ostomy bag but now has decreased output due to recent poor p.o. intake.  She reports mild abdominal cramps but denies chest pain, cough, dysuria hematemesis, hematuria, shortness of breath or bloody stools. CT abdomen/pelvis was performed on admission which showed obstructing 2.5 cm staghorn calculus at the left UPJ with moderate to severe left-sided hydronephrosis.  She was evaluated by urology on admission and recommended for left-sided percutaneous nephrostomy tube placement.  This was performed with IR on 10/10/2023.

## 2023-10-11 NOTE — Assessment & Plan Note (Addendum)
-   Patient presented with abdominal pain, weakness, and generalized malaise - CT showing obstructing 2.5 cm staghorn calculus at the left UPJ with moderate to severe left-sided hydronephrosis - Patient underwent left nephrostomy tube placement on 10/10/2023 with IR -Percutaneous nephrostomy tube exchange in 6 to 8 weeks per IR

## 2023-10-11 NOTE — Progress Notes (Signed)
  Subjective: The patient is doing well.  No nausea or vomiting. Pain is adequately controlled. Feels better overall S/p L nephrostomy tube  Objective: Vital signs in last 24 hours: Temp:  [97.5 F (36.4 C)-98 F (36.7 C)] 97.5 F (36.4 C) (12/08 0619) Pulse Rate:  [69-100] 69 (12/08 0619) Resp:  [14-19] 14 (12/08 0619) BP: (113-142)/(74-94) 113/74 (12/08 0619) SpO2:  [97 %-100 %] 100 % (12/08 0619)  Intake/Output from previous day: 12/07 0701 - 12/08 0700 In: 1100 [I.V.:1000; IV Piggyback:100] Out: 300 [Urine:300] Intake/Output this shift: No intake/output data recorded.  Physical Exam:  General: Alert and oriented. CV: RRR Lungs: Clear bilaterally. GI: Soft, Nondistended. Tube site: Dressing-Dry & intact, no eccymosis, tube draining light yellow urine  Urine: Clear   Lab Results: Recent Labs    10/09/23 1956 10/10/23 1128  HGB 14.8 14.2  HCT 45.2 44.3    Assessment/Plan: POD# 1 s/p left neph tube for obstructive pylo with staghorn   # Staghorn stone w/ obstructive pyelo  - continue neph tube  - continue abx (CTX) - total abx duration for 14 days can change according to Cx - blood cx pending possibly contamination  - urology will set up f/u to discuss PCNL for stone management - DC with neph tube  - urology to follow peripherally.    Vilma Prader MD.

## 2023-10-12 DIAGNOSIS — N3001 Acute cystitis with hematuria: Secondary | ICD-10-CM | POA: Diagnosis not present

## 2023-10-12 DIAGNOSIS — A419 Sepsis, unspecified organism: Secondary | ICD-10-CM | POA: Diagnosis not present

## 2023-10-12 DIAGNOSIS — N201 Calculus of ureter: Secondary | ICD-10-CM | POA: Diagnosis not present

## 2023-10-12 DIAGNOSIS — N179 Acute kidney failure, unspecified: Secondary | ICD-10-CM | POA: Diagnosis not present

## 2023-10-12 LAB — CBC WITH DIFFERENTIAL/PLATELET
Abs Immature Granulocytes: 0.22 10*3/uL — ABNORMAL HIGH (ref 0.00–0.07)
Basophils Absolute: 0 10*3/uL (ref 0.0–0.1)
Basophils Relative: 0 %
Eosinophils Absolute: 0.1 10*3/uL (ref 0.0–0.5)
Eosinophils Relative: 1 %
HCT: 34 % — ABNORMAL LOW (ref 36.0–46.0)
Hemoglobin: 10.6 g/dL — ABNORMAL LOW (ref 12.0–15.0)
Immature Granulocytes: 2 %
Lymphocytes Relative: 16 %
Lymphs Abs: 2.3 10*3/uL (ref 0.7–4.0)
MCH: 29.5 pg (ref 26.0–34.0)
MCHC: 31.2 g/dL (ref 30.0–36.0)
MCV: 94.7 fL (ref 80.0–100.0)
Monocytes Absolute: 0.8 10*3/uL (ref 0.1–1.0)
Monocytes Relative: 6 %
Neutro Abs: 10.8 10*3/uL — ABNORMAL HIGH (ref 1.7–7.7)
Neutrophils Relative %: 75 %
Platelets: 319 10*3/uL (ref 150–400)
RBC: 3.59 MIL/uL — ABNORMAL LOW (ref 3.87–5.11)
RDW: 12.5 % (ref 11.5–15.5)
WBC: 14.2 10*3/uL — ABNORMAL HIGH (ref 4.0–10.5)
nRBC: 0 % (ref 0.0–0.2)

## 2023-10-12 LAB — BASIC METABOLIC PANEL
Anion gap: 9 (ref 5–15)
BUN: 48 mg/dL — ABNORMAL HIGH (ref 8–23)
CO2: 19 mmol/L — ABNORMAL LOW (ref 22–32)
Calcium: 8.2 mg/dL — ABNORMAL LOW (ref 8.9–10.3)
Chloride: 107 mmol/L (ref 98–111)
Creatinine, Ser: 2.51 mg/dL — ABNORMAL HIGH (ref 0.44–1.00)
GFR, Estimated: 21 mL/min — ABNORMAL LOW (ref >60)
Glucose, Bld: 94 mg/dL (ref 70–99)
Potassium: 4.2 mmol/L (ref 3.5–5.1)
Sodium: 135 mmol/L (ref 135–145)

## 2023-10-12 LAB — CULTURE, BLOOD (ROUTINE X 2)

## 2023-10-12 LAB — MAGNESIUM: Magnesium: 1.9 mg/dL (ref 1.7–2.4)

## 2023-10-12 MED ORDER — GUAIFENESIN-DM 100-10 MG/5ML PO SYRP: 5.0000 mL | ORAL_SOLUTION | ORAL | Status: DC | PRN

## 2023-10-12 MED ORDER — OXYCODONE HCL 5 MG PO TABS
5.0000 mg | ORAL_TABLET | ORAL | Status: DC | PRN
  Administered 2023-10-12 – 2023-10-15 (×11): 5 mg via ORAL
  Filled 2023-10-12 (×11): qty 1

## 2023-10-12 NOTE — Progress Notes (Signed)
Referring Physician(s): Frisbie,J  Supervising Physician: Oley Balm  Patient Status:  [facility] - In-pt  Chief Complaint: Left flank pain, obstructing left UPJ stone  Subjective: Pt cont to have some left flank discomfort; denies fever,N/V   Allergies: Nsaids, Ace inhibitors, and Iodine  Medications: Prior to Admission medications   Medication Sig Start Date End Date Taking? Authorizing Provider  diphenhydramine-acetaminophen (TYLENOL PM) 25-500 MG TABS tablet Take 1 tablet by mouth at bedtime as needed.   Yes [provider]  Octreotide Acetate 50 MCG/ML SOSY Inject 1 ml ( ) into the skin every 8 hours. 07/30/22  Yes Ronnald Nian, MD  STELARA 90 MG/ML SOSY injection Inject 90 mg into the skin every 3 (three) months. 01/22/23  Yes [provider]  loperamide (IMODIUM) 2 MG capsule Take 1 capsule (2 mg total) by mouth 3 (three) times daily as needed for diarrhea or loose stools. Patient not taking: Reported on 09/29/2023 07/03/22   Albertine Grates, MD  SYRINGE-NEEDLE, DISP, 3 ML 25G X 5/8" 3 ML MISC Use as directed for octreotide injection 07/03/22   Albertine Grates, MD     Vital Signs: BP 113/69 (BP Location: Left Arm)   Pulse 65   Temp 98 F (36.7 C) (Oral)   Resp 16   Ht 5\' 4"  (1.626 m)   Wt 130 lb (59 kg)   LMP 06/10/2011 (Exact Date)   SpO2 99%   BMI 22.31 kg/m   Physical Exam: awake/alert; left PCN intact, insertion site ok, mildly tender, OP 160 cc yesterday light yellow urine; drain flushed but little return  Imaging: IR NEPHROSTOMY PLACEMENT LEFT  Result Date: 10/10/2023 INDICATION: 62 year with new left staghorn calculus and hydronephrosis. EXAM: IR NEPHROSTOMY PLACEMENT LEFT COMPARISON:  None Available. MEDICATIONS: In patient already receiving intravenous antibiotics. No additional prophylaxis was administered. ANESTHESIA/SEDATION: Fentanyl 2 mcg IV; Versed 100 mg IV administered by the radiology nurse Moderate Sedation Time:  10 minutes The  patient's vital signs and level consciousness were continuously monitored during the procedure by the interventional radiology nurse under my direct supervision. CONTRAST:  15mL OMNIPAQUE IOHEXOL 300 MG/ML SOLN - administered into the collecting system(s) FLUOROSCOPY: Radiation exposure index: 8 mGy reference air kerma COMPLICATIONS: None immediate. TECHNIQUE: The procedure, risks, benefits, and alternatives were explained to the patient. Questions regarding the procedure were encouraged and answered. The patient understands and consents to the procedure. The left flank was prepped with chlorhexidine in a sterile fashion, and a sterile drape was applied covering the operative field. A sterile gown and sterile gloves were used for the procedure. Local anesthesia was provided with 1% Lidocaine. The left flank was interrogated with ultrasound and the left kidney identified. The kidney is hydronephrotic. A suitable access site on the skin overlying the lower pole, posterior calix was identified. After local mg anesthesia was achieved, a small skin nick was made with an 11 blade scalpel. A 21 gauge Accustick needle was then advanced under direct sonographic guidance into the lower pole of the left kidney. A 0.018 inch wire was advanced under fluoroscopic guidance into the left renal collecting system. The Accustick sheath was then advanced over the wire and a 0.018 system exchanged for a 0.035 system. Gentle hand injection of contrast material confirms placement of the sheath within the renal collecting system. There is hydronephrosis. Greater than expected filling defect within the renal pelvis and lower pole infundibulum. The tract from the scan into the renal collecting system was then dilated serially to 10-French.  A 10-French Cook all-purpose drain was then placed and positioned under fluoroscopic guidance. The locking loop is well formed within the left renal pelvis. The catheter was secured to the skin with 0  Prolene and a sterile bandage was placed. Catheter was left to gravity bag drainage. IMPRESSION: Successful placement of a left 10 French percutaneous nephrostomy tube. Of note, the filling defect within the renal pelvis and lower pole infundibulum and calices is greater than expected based on the CT images from yesterday. This suggests that a large component of the stone burden is radiolucent, or that there is underlying soft tissue mass or blood products/thrombus as well as the visible radiopaque stone. Electronically Signed   By: Malachy Moan M.D.   On: 10/10/2023 16:24   CT ABDOMEN PELVIS WO CONTRAST  Result Date: 10/09/2023 CLINICAL DATA:  Abdominal pain, nausea and vomiting, history of Crohn disease EXAM: CT ABDOMEN AND PELVIS WITHOUT CONTRAST TECHNIQUE: Multidetector CT imaging of the abdomen and pelvis was performed following the standard protocol without IV contrast. Unenhanced CT was performed per clinician order. Lack of IV contrast limits sensitivity and specificity, especially for evaluation of abdominal/pelvic solid viscera. RADIATION DOSE REDUCTION: This exam was performed according to the departmental dose-optimization program which includes automated exposure control, adjustment of the mA and/or kV according to patient size and/or use of iterative reconstruction technique. COMPARISON:  07/01/2022 FINDINGS: Lower chest: Subpleural consolidation and volume loss within the medial aspect of the right lower lobe, favor atelectasis and/or scarring. No acute airspace disease. Hepatobiliary: Cholelithiasis without cholecystitis. Unremarkable unenhanced appearance of the liver. Pancreas: Unremarkable unenhanced appearance. Spleen: Unremarkable unenhanced appearance. Adrenals/Urinary Tract: 2.5 cm staghorn type calculus at the left UPJ, with resulting moderate to severe left hydronephrosis. The right kidney is unremarkable. The adrenals and bladder are normal. Stomach/Bowel: Stable postsurgical  changes from subtotal colectomy, Hartmann's pouch, right-sided ileostomy, and left-sided mucous fistula. No bowel obstruction or ileus. No bowel wall thickening or inflammatory change. Vascular/Lymphatic: Aortic atherosclerosis. No enlarged abdominal or pelvic lymph nodes. Reproductive: Status post hysterectomy. No adnexal masses. Other: No free fluid or free intraperitoneal gas. No abdominal wall hernia. Musculoskeletal: No acute or destructive bony abnormalities. Reconstructed images demonstrate no additional findings. IMPRESSION: 1. Obstructing 2.5 cm staghorn calculus at the left UPJ, with moderate to severe left-sided hydronephrosis. 2. Cholelithiasis without cholecystitis. 3. Extensive postsurgical changes of the bowel, stable since prior study. No obstruction or ileus. No inflammatory change. 4. Subpleural consolidation and volume loss within the medial basilar segment of the right lower lobe, likely scarring or atelectasis. 5.  Aortic Atherosclerosis (ICD10-I70.0). Electronically Signed   By: Sharlet Salina M.D.   On: 10/09/2023 22:15    Labs:  CBC: Recent Labs    10/09/23 1956 10/10/23 1128 10/11/23 1015 10/12/23 0514  WBC 24.3* 33.0* 18.7* 14.2*  HGB 14.8 14.2 11.7* 10.6*  HCT 45.2 44.3 38.2 34.0*  PLT 550* 506* 358 319    COAGS: Recent Labs    10/10/23 0034  INR 1.8*    BMP: Recent Labs    10/09/23 1956 10/10/23 1128 10/11/23 1015 10/12/23 0514  NA 133* 133* 130* 135  K 3.5 4.5 4.0 4.2  CL 114* 99 104 107  CO2 12* 18* 17* 19*  GLUCOSE 111* 112* 91 94  BUN 63* 86* 64* 48*  CALCIUM 5.4* 9.2 8.1* 8.2*  CREATININE 3.41* 4.82* 3.23* 2.51*  GFRNONAA 15* 10* 16* 21*    LIVER FUNCTION TESTS: Recent Labs    09/29/23 1226 10/09/23 1956 10/10/23  1128  BILITOT <0.2 0.7 0.5  AST 11 22 21   ALT 8 10 19   ALKPHOS 89 61 90  PROT 7.4 4.9* 8.4*  ALBUMIN 4.2 2.0* 3.6    Assessment and Plan: 62 yo female with hx significant sig for Crohn's disease s/p ileocolectomy,  sigmoid colectomy with an ileostomy, recurrent UTIs, anxiety and depression, HTN and CKD 3B. Now with left UPJ staghorn calculus/hydronephrosis; s/p left PCN 12/7; afebrile, WBC 14.2(18.7), hgb 10.6(11.7), creat 2.51(3.23); blood cx neg to date; recent urine cx- proteus; cont current tx, monitor output /labs closely; further plans as outlined by urology   Electronically Signed: D. Jeananne Rama, PA-C 10/12/2023, 11:27 AM   I spent a total of 15 Minutes at the the patient's bedside AND on the patient's hospital floor or unit, greater than 50% of which was counseling/coordinating care for left percutaneous nephrostomy    Patient ID: Cynthia [name], female   DOB: 1960-11-26, 62 y.o.   MRN: [id number]

## 2023-10-12 NOTE — TOC Initial Note (Addendum)
Transition of Care [employer] [facility]) - Initial/Assessment Note    Patient Details  Name: Cynthia [name] MRN: [id number] Date of Birth: [birth year]  [facility]) CM/SW Contact:    Darleene Cleaver, LCSW Phone Number: 10/12/2023, 6:56 PM  Clinical Narrative:                  Patient is a 62 year old female who is alert and oriented x4.  Patient was living at home, but apparently she is not able to return at this time.  CSW was informed that patient had some housing difficulties homeless shelter resources provided in her AVS and also given to her.  Patient was also provided [facility] DSS contact information to see if she would be eligible for any low income housing options.  Per bedside nurse patient gets approximately $3000 per month from disability.  CSW suggested that she look at hotels to stay in the interim.  Patient may need transportation assistance when ready for discharge.  SDOH for food, and utilities addressed, resources provided for patient.  TOC to continue to follow patient's progress throuhout discharge planning.  Expected Discharge Plan: Homeless Shelter Barriers to Discharge: Continued Medical Work up   Patient Goals and CMS Choice Patient states their goals for this hospitalization and ongoing recovery are:: To go to a shelter or hotel after discharge.          Expected Discharge Plan and Services In-house Referral: Clinical Social Work     Living arrangements for the past 2 months: Single Family Home                                      Prior Living Arrangements/Services Living arrangements for the past 2 months: Single Family Home Lives with:: Self Patient language and need for interpreter reviewed:: Yes Do you feel safe going back to the place where you live?: Yes      Need for Family Participation in Patient Care: No (Comment) Care giver support system in place?: No (comment)   Criminal Activity/Legal Involvement Pertinent to Current  Situation/Hospitalization: No - Comment as needed  Activities of Daily Living   ADL Screening (condition at time of admission) Independently performs ADLs?: Yes (appropriate for developmental age) Is the patient deaf or have difficulty hearing?: No Does the patient have difficulty seeing, even when wearing glasses/contacts?: No Does the patient have difficulty concentrating, remembering, or making decisions?: No  Permission Sought/Granted Permission sought to share information with : Case Manager, Family Supports Permission granted to share information with : Yes, Release of Information Signed, Yes, Verbal Permission Granted              Emotional Assessment Appearance:: Appears stated age Attitude/Demeanor/Rapport: Engaged Affect (typically observed): Anxious, Stable, Sad Orientation: : Oriented to Self, Oriented to  Time, Oriented to Situation, Oriented to Place Alcohol / Substance Use: Illicit Drugs, Tobacco Use Psych Involvement: Yes (comment)  Admission diagnosis:  Obstruction of left ureteropelvic junction (UPJ) due to stone [N20.1] Crohn's disease with complication, unspecified gastrointestinal tract location [facility]) [K50.919] Patient Active Problem List   Diagnosis Date Noted   Contamination of blood culture 10/11/2023   Obstruction of left ureteropelvic junction (UPJ) due to stone 10/10/2023   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   Sepsis secondary to UTI (HCC) 10/10/2023   Acute cystitis with hematuria 10/10/2023   Acute renal failure superimposed on stage 3b chronic kidney disease (HCC)  04/14/2022   CKD (chronic kidney disease) stage 3, GFR 30-59 ml/min (HCC) 04/13/2022   Anxiety 04/13/2022   Irritant contact dermatitis associated with fecal stoma 04/13/2022   Hypoglycemia 12/25/2021   Vitamin B deficiency, unspecified 10/24/2021   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   Acute renal failure (ARF) (HCC) 08/12/2021   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HCC)    Hypotension 07/10/2021    High anion gap metabolic acidosis 07/10/2021   Acute renal failure, unspecified acute renal failure type (HCC) 07/10/2021   Nausea and vomiting 06/26/2021   Adjustment disorder 03/14/2021   Unspecified severe protein-calorie malnutrition (HCC) 03/13/2021   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   History of rectal polyp 2020 01/12/2021   Anxiety associated with depression 01/12/2021   High risk medication use 11/27/2020   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 [place] [address] Phone: [phone] Fax: [phone]  [place] [facility] [address] [place] [address] Phone: [phone] Fax: [phone]  [facility] [address] [place] [address] Phone: [phone] Fax: [phone]  [facility] [place] [place], [place] - [address] [place] [address] Phone: [phone] Fax: [phone]     Social Determinants of Health (SDOH) Social History: SDOH Screenings   Food Insecurity: Food Insecurity Present (10/11/2023)  Housing: Patient Declined (10/11/2023)  Transportation Needs: No Transportation Needs (10/10/2023)  Utilities: At Risk (10/11/2023)  Depression (PHQ2-9): Low Risk  (09/29/2023)  Financial Resource  Strain: Medium Risk (03/20/2021)   [facility] from [facility]  Social Connections: Unknown (03/05/2022)   Received from [facility]  Stress: Stress Concern Present (03/20/2021)   [facility] from [facility]  Tobacco Use: Medium Risk (10/10/2023)   SDOH Interventions:     Readmission Risk Interventions    08/20/2021    1:07 PM 04/09/2021    9:32 AM  Readmission Risk Prevention Plan  Transportation Screening Complete Complete  Medication Review (RN Care Manager) Complete Complete  PCP or Specialist appointment within 3-5 days of discharge Complete Complete  HRI or Home Care Consult Complete Complete  SW Recovery Care/Counseling Consult Complete Complete  Palliative Care Screening Not Applicable Not Applicable  Skilled Nursing Facility Not Applicable Not Applicable

## 2023-10-12 NOTE — Plan of Care (Signed)

## 2023-10-12 NOTE — Consult Note (Signed)
WOC Nurse ostomy consult note Stoma type/location: RLQ, ileostomy  Stomal assessment/size: not assessed  Peristomal assessment: patient self reports skin intact Treatment options for stomal/peristomal skin: 2" barrier ring  Output liquid, green stool  Ostomy pouching: 2pc. Soft convex with 2" skin barrier ring  Education provided: patient is independent with ostomy care  Enrolled patient in [employer] DC program: Yes, previously   Order Supplies: 2pc soft convex [name] # [id number] skin barrier/[name] # 649 pouch/[name] # [id number] -Order 5 3 each   Re consult if needed, will not follow at this time. Thanks  Dare Sanger [employer], RN,CWOCN, CNS, CWON-AP [phone])

## 2023-10-12 NOTE — Progress Notes (Signed)
Progress Note    Cynthia [name]   ZOX:[id number]  DOB: September 26, 1961  DOA: 10/09/2023     2 PCP: Ronnald Nian, MD  Initial CC: Weakness and fatigue, abdominal pain  Hospital Course: Cynthia [name] is a 62 y.o. female with medical history significant of Crohn's disease s/p ileocolectomy, sigmoid colectomy with an ileostomy, recurrent UTIs, anxiety and depression, HTN and CKD 3B. She presented with generalized weakness and not feeling well for at least a week prior to admission. She has had ongoing nausea and vomiting with right sided flank and back pain. She initially had loose stools from her ostomy bag but now has decreased output due to recent poor p.o. intake.  She reports mild abdominal cramps but denies chest pain, cough, dysuria hematemesis, hematuria, shortness of breath or bloody stools. CT abdomen/pelvis was performed on admission which showed obstructing 2.5 cm staghorn calculus at the left UPJ with moderate to severe left-sided hydronephrosis.  She was evaluated by urology on admission and recommended for left-sided percutaneous nephrostomy tube placement.  This was performed with IR on 10/10/2023.  Interval History:  No events overnight. Had some worsening discomfort in her back associated with nephrostomy tube.  Responded well to dose of oxycodone.  Otherwise doing okay.  Assessment and Plan: * Obstruction of left ureteropelvic junction (UPJ) due to stone - Patient presented with abdominal pain, weakness, and generalized malaise - CT showing obstructing 2.5 cm staghorn calculus at the left UPJ with moderate to severe left-sided hydronephrosis - Patient underwent left nephrostomy tube placement on 10/10/2023 with IR -Percutaneous nephrostomy tube exchange in 6 to 8 weeks per IR  Sepsis secondary to UTI (HCC) - Tachycardia, leukocytosis, urinary source from obstructed staghorn calculus - Continue Rocephin; see UTI  Acute cystitis with hematuria - UA noted with large  LE, negative nitrite, greater than 50 WBC - Suspected UTI from staghorn calculus - Continue Rocephin - urine culture growing Proteus; follow up for sensitivities   Acute renal failure superimposed on stage 3b chronic kidney disease (HCC) - patient has history of CKD3b. Baseline creat ~ 1.5, eGFR~ 40 - patient presents with increase in creat >0.3 mg/dL above baseline or creat increase >1.5x baseline presumed to have occurred within past 7 days PTA -Creatinine 3.41 on admission and peaked at 4.82 - Etiology suspected due to bladder outlet obstruction from staghorn calculus causing hydronephrosis - Renal function has improved after obstruction relief - S/p fluids on admission -Continue oral intake for now - BMP in am   Contamination of blood culture - from admission 2/2 bottles (1 set) growing MRSE and 2nd set remains negative -Suspected contamination for now - Repeat blood cultures on 10/11/2023 and monitor  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  Blood pressure currently controlled  Gastroesophageal reflux disease - No home meds noted on med rec   Old records reviewed in assessment of this patient  Antimicrobials: Rocephin 10/10/2023 >> current  DVT prophylaxis:  enoxaparin (LOVENOX) injection 30 mg Start: 10/10/23 1000   Code Status:   Code Status: Full Code  Mobility Assessment (Last 72 Hours)     Mobility Assessment     Row Name 10/12/23 0830 10/11/23 2000 10/11/23 0835 10/10/23 2000 10/10/23 1137   Does patient have an order for bedrest  or is patient medically unstable No - Continue assessment No - Continue assessment No - Continue assessment No - Continue assessment No - Continue assessment   What is the  highest level of mobility based on the progressive mobility assessment? Level 6 (Walks independently in room and hall) - Balance while walking in room without assist - Complete Level 6 (Walks independently in room and hall) - Balance while walking in room without assist - Complete Level 5 (Walks with assist in room/hall) - Balance while stepping forward/back and can walk in room with assist - Complete Level 6 (Walks independently in room and hall) - Balance while walking in room without assist - Complete Level 6 (Walks independently in room and hall) - Balance while walking in room without assist - Complete            Barriers to discharge: none Disposition Plan:  Home Status is: Inpt  Objective: Blood pressure 135/82, pulse 65, temperature 97.7 F (36.5 C), temperature source Oral, resp. rate 18, height 5\' 4"  (1.626 m), weight 59 kg, last menstrual period 06/10/2011, SpO2 100%.  Examination:  Physical Exam Constitutional:      Appearance: Normal appearance.  HENT:     Head: Normocephalic and atraumatic.     Mouth/Throat:     Mouth: Mucous membranes are moist.  Eyes:     Extraocular Movements: Extraocular movements intact.  Cardiovascular:     Rate and Rhythm: Normal rate and regular rhythm.  Pulmonary:     Effort: Pulmonary effort is normal. No respiratory distress.     Breath sounds: Normal breath sounds. No wheezing.  Abdominal:     General: Bowel sounds are normal. There is no distension.     Palpations: Abdomen is soft.     Tenderness: There is no abdominal tenderness.     Comments: Ileostomy bag in place  Musculoskeletal:        General: Normal range of motion.     Cervical back: Normal range of motion and neck supple.  Skin:    General: Skin is warm and dry.  Neurological:     General: No focal deficit present.     Mental Status: She is alert.  Psychiatric:        Mood and Affect: Mood normal.      Consultants:  Urology IR  Procedures:  10/10/2023: Left  nephrostomy tube placement  Data Reviewed: Results for orders placed or performed during the hospital encounter of 10/09/23 (from the past 24 hour(s))  Basic metabolic panel     Status: Abnormal   Collection Time: 10/12/23  5:14 AM  Result Value Ref Range   Sodium 135 135 - 145 mmol/L   Potassium 4.2 3.5 - 5.1 mmol/L   Chloride 107 98 - 111 mmol/L   CO2 19 (L) 22 - 32 mmol/L   Glucose, Bld 94 70 - 99 mg/dL   BUN 48 (H) 8 - 23 mg/dL   Creatinine, Ser 4.40 (H) 0.44 - 1.00 mg/dL   Calcium 8.2 (L) 8.9 - 10.3 mg/dL   GFR, Estimated 21 (L) >60 mL/min   Anion gap 9 5 - 15  CBC with Differential/Platelet     Status: Abnormal   Collection Time: 10/12/23  5:14 AM  Result Value Ref Range   WBC 14.2 (H) 4.0 - 10.5 K/uL   RBC 3.59 (L) 3.87 - 5.11 MIL/uL   Hemoglobin 10.6 (L) 12.0 - 15.0 g/dL   HCT 10.2 (L) 72.5 - 36.6 %   MCV 94.7  80.0 - 100.0 fL   MCH 29.5 26.0 - 34.0 pg   MCHC 31.2 30.0 - 36.0 g/dL   RDW 16.1 09.6 - 04.5 %   Platelets 319 150 - 400 K/uL   nRBC 0.0 0.0 - 0.2 %   Neutrophils Relative % 75 %   Neutro Abs 10.8 (H) 1.7 - 7.7 K/uL   Lymphocytes Relative 16 %   Lymphs Abs 2.3 0.7 - 4.0 K/uL   Monocytes Relative 6 %   Monocytes Absolute 0.8 0.1 - 1.0 K/uL   Eosinophils Relative 1 %   Eosinophils Absolute 0.1 0.0 - 0.5 K/uL   Basophils Relative 0 %   Basophils Absolute 0.0 0.0 - 0.1 K/uL   Immature Granulocytes 2 %   Abs Immature Granulocytes 0.22 (H) 0.00 - 0.07 K/uL  Magnesium     Status: None   Collection Time: 10/12/23  5:14 AM  Result Value Ref Range   Magnesium 1.9 1.7 - 2.4 mg/dL    I have reviewed pertinent nursing notes, vitals, labs, and images as necessary. I have ordered labwork to follow up on as indicated.  I have reviewed the last notes from staff over past 24 hours. I have discussed patient's care plan and test results with nursing staff, CM/SW, and other staff as appropriate.  Time spent: Greater than 50% of the 55 minute visit was spent in  counseling/coordination of care for the patient as laid out in the A&P.   LOS: 2 days   Lewie Chamber, MD [facility] 10/12/2023, 2:06 PM

## 2023-10-12 NOTE — Progress Notes (Signed)
Subjective: First time meeting patient this morning.  She was resting in bed and tolerating her percutaneous nephrostomy tube well.  She shared that she has been residing in a trailer without running water or heat and became emotional.  Will confirm with Dr. Jennette Bill about and recommend case management assist with resources to secure her somewhere safe to stay while she recovers.  Objective: Vital signs in last 24 hours: Temp:  [98 F (36.7 C)-98.5 F (36.9 C)] 98 F (36.7 C) (12/09 0602) Pulse Rate:  [65-94] 65 (12/09 0602) Resp:  [16] 16 (12/08 2110) BP: (113-124)/(67-80) 113/69 (12/09 0602) SpO2:  [99 %-100 %] 99 % (12/09 0602)  Assessment/Plan: # Left UPJ stone # AKI # Proteus UTI  Broad ABX per primary.  Sensitivities pending. S/p left percutaneous nephrostomy tube placement.  Draining clear yellow urine on rounds.  Patient is tolerating well and renal function is rapidly returning to baseline. Normothermic, improving leukocytosis Recommend case management involvement considering patient's housing insecurity. Urology will continue to follow  Intake/Output from previous day: 12/08 0701 - 12/09 0700 In: -  Out: 610 [Urine:560; Stool:50]  Intake/Output this shift: Total I/O In: 218 [P.O.:118; IV Piggyback:100] Out: 150 [Stool:150]  Physical Exam:  General: Alert and oriented CV: No cyanosis Lungs: equal chest rise Abdomen: Soft, NTND, no rebound or guarding Gu: Left PCN T.  Clear yellow urine  Lab Results: Recent Labs    10/10/23 1128 10/11/23 1015 10/12/23 0514  HGB 14.2 11.7* 10.6*  HCT 44.3 38.2 34.0*   BMET Recent Labs    10/11/23 1015 10/12/23 0514  NA 130* 135  K 4.0 4.2  CL 104 107  CO2 17* 19*  GLUCOSE 91 94  BUN 64* 48*  CREATININE 3.23* 2.51*  CALCIUM 8.1* 8.2*     Studies/Results: IR NEPHROSTOMY PLACEMENT LEFT  Result Date: 10/10/2023 INDICATION: 61 year with new left staghorn calculus and hydronephrosis. EXAM: IR NEPHROSTOMY  PLACEMENT LEFT COMPARISON:  None Available. MEDICATIONS: In patient already receiving intravenous antibiotics. No additional prophylaxis was administered. ANESTHESIA/SEDATION: Fentanyl 2 mcg IV; Versed 100 mg IV administered by the radiology nurse Moderate Sedation Time:  10 minutes The patient's vital signs and level consciousness were continuously monitored during the procedure by the interventional radiology nurse under my direct supervision. CONTRAST:  15mL OMNIPAQUE IOHEXOL 300 MG/ML SOLN - administered into the collecting system(s) FLUOROSCOPY: Radiation exposure index: 8 mGy reference air kerma COMPLICATIONS: None immediate. TECHNIQUE: The procedure, risks, benefits, and alternatives were explained to the patient. Questions regarding the procedure were encouraged and answered. The patient understands and consents to the procedure. The left flank was prepped with chlorhexidine in a sterile fashion, and a sterile drape was applied covering the operative field. A sterile gown and sterile gloves were used for the procedure. Local anesthesia was provided with 1% Lidocaine. The left flank was interrogated with ultrasound and the left kidney identified. The kidney is hydronephrotic. A suitable access site on the skin overlying the lower pole, posterior calix was identified. After local mg anesthesia was achieved, a small skin nick was made with an 11 blade scalpel. A 21 gauge Accustick needle was then advanced under direct sonographic guidance into the lower pole of the left kidney. A 0.018 inch wire was advanced under fluoroscopic guidance into the left renal collecting system. The Accustick sheath was then advanced over the wire and a 0.018 system exchanged for a 0.035 system. Gentle hand injection of contrast material confirms placement of the sheath within the  renal collecting system. There is hydronephrosis. Greater than expected filling defect within the renal pelvis and lower pole infundibulum. The tract from  the scan into the renal collecting system was then dilated serially to 10-French. A 10-French Cook all-purpose drain was then placed and positioned under fluoroscopic guidance. The locking loop is well formed within the left renal pelvis. The catheter was secured to the skin with 0 Prolene and a sterile bandage was placed. Catheter was left to gravity bag drainage. IMPRESSION: Successful placement of a left 10 French percutaneous nephrostomy tube. Of note, the filling defect within the renal pelvis and lower pole infundibulum and calices is greater than expected based on the CT images from yesterday. This suggests that a large component of the stone burden is radiolucent, or that there is underlying soft tissue mass or blood products/thrombus as well as the visible radiopaque stone. Electronically Signed   By: Malachy Moan M.D.   On: 10/10/2023 16:24      LOS: 2 days   Elmon Kirschner, NP [facility] Pager: [phone]  10/12/2023, 10:30 AM

## 2023-10-12 NOTE — TOC Progression Note (Deleted)
Transition of Care [facility]) - Progression Note    Patient Details  Name: Cynthia [name] MRN: [id number] Date of Birth: 1961-03-27  [facility] of [facility] CM/SW Contact  Darleene Cleaver, [place] Phone Number: 10/12/2023, 6:49 PM  Clinical Narrative:     CSW received message from nurse that patient wanted homeless shelter resources.  CSW provided list of resources for her.  CSW also added to AVS.  Patient was also provided [facility] DSS contact information to see if she would be eligible for any low income housing.       Expected Discharge Plan and Services  Shelter or hotel.                                             Social Determinants of Health (SDOH) Interventions SDOH Screenings   Food Insecurity: Food Insecurity Present (10/11/2023)  Housing: Patient Declined (10/11/2023)  Transportation Needs: No Transportation Needs (10/10/2023)  Utilities: At Risk (10/11/2023)  Depression (PHQ2-9): Low Risk  (09/29/2023)  Financial Resource Strain: Medium Risk (03/20/2021)   Received from [facility], [facility]  Social Connections: Unknown (03/05/2022)   Received from [facility]  Stress: Stress Concern Present (03/20/2021)   Received from [facility], [facility]  Tobacco Use: Medium Risk (10/10/2023)    Readmission Risk Interventions    08/20/2021    1:07 PM 04/09/2021    9:32 AM  Readmission Risk Prevention Plan  Transportation Screening Complete Complete  Medication Review (RN Care Manager) Complete Complete  PCP or Specialist appointment within 3-5 days of discharge Complete Complete  HRI or Home Care Consult Complete Complete  SW Recovery Care/Counseling Consult Complete Complete  Palliative Care Screening Not Applicable Not Applicable  Skilled Nursing Facility Not Applicable Not Applicable

## 2023-10-13 DIAGNOSIS — N179 Acute kidney failure, unspecified: Secondary | ICD-10-CM | POA: Diagnosis not present

## 2023-10-13 DIAGNOSIS — N201 Calculus of ureter: Secondary | ICD-10-CM | POA: Diagnosis not present

## 2023-10-13 DIAGNOSIS — N3001 Acute cystitis with hematuria: Secondary | ICD-10-CM | POA: Diagnosis not present

## 2023-10-13 DIAGNOSIS — A419 Sepsis, unspecified organism: Secondary | ICD-10-CM | POA: Diagnosis not present

## 2023-10-13 LAB — BASIC METABOLIC PANEL
Anion gap: 9 (ref 5–15)
BUN: 37 mg/dL — ABNORMAL HIGH (ref 8–23)
CO2: 18 mmol/L — ABNORMAL LOW (ref 22–32)
Calcium: 8.2 mg/dL — ABNORMAL LOW (ref 8.9–10.3)
Chloride: 108 mmol/L (ref 98–111)
Creatinine, Ser: 2.04 mg/dL — ABNORMAL HIGH (ref 0.44–1.00)
GFR, Estimated: 27 mL/min — ABNORMAL LOW (ref >60)
Glucose, Bld: 109 mg/dL — ABNORMAL HIGH (ref 70–99)
Potassium: 4.7 mmol/L (ref 3.5–5.1)
Sodium: 135 mmol/L (ref 135–145)

## 2023-10-13 LAB — MAGNESIUM: Magnesium: 1.7 mg/dL (ref 1.7–2.4)

## 2023-10-13 LAB — CBC WITH DIFFERENTIAL/PLATELET
Abs Immature Granulocytes: 0.19 10*3/uL — ABNORMAL HIGH (ref 0.00–0.07)
Basophils Absolute: 0 10*3/uL (ref 0.0–0.1)
Basophils Relative: 0 %
Eosinophils Absolute: 0.3 10*3/uL (ref 0.0–0.5)
Eosinophils Relative: 2 %
HCT: 33.4 % — ABNORMAL LOW (ref 36.0–46.0)
Hemoglobin: 10.2 g/dL — ABNORMAL LOW (ref 12.0–15.0)
Immature Granulocytes: 1 %
Lymphocytes Relative: 20 %
Lymphs Abs: 2.7 10*3/uL (ref 0.7–4.0)
MCH: 29.5 pg (ref 26.0–34.0)
MCHC: 30.5 g/dL (ref 30.0–36.0)
MCV: 96.5 fL (ref 80.0–100.0)
Monocytes Absolute: 0.8 10*3/uL (ref 0.1–1.0)
Monocytes Relative: 6 %
Neutro Abs: 9.9 10*3/uL — ABNORMAL HIGH (ref 1.7–7.7)
Neutrophils Relative %: 71 %
Platelets: 298 10*3/uL (ref 150–400)
RBC: 3.46 MIL/uL — ABNORMAL LOW (ref 3.87–5.11)
RDW: 12.7 % (ref 11.5–15.5)
WBC: 13.9 10*3/uL — ABNORMAL HIGH (ref 4.0–10.5)
nRBC: 0 % (ref 0.0–0.2)

## 2023-10-13 NOTE — Progress Notes (Addendum)
Referring Physician(s): Armandina Gemma  Supervising Physician: Irish Lack  Patient Status:  [facility] - In-pt  Chief Complaint:  Left flank pain, obstructing left UPJ stone  Subjective:  Patient is alert, comfortable. She continues to have mild left flank discomfort. She denies fevers/chills, N/V.  Allergies: Nsaids, Ace inhibitors, and Iodine  Medications: Prior to Admission medications   Medication Sig Start Date End Date Taking? Authorizing Provider  diphenhydramine-acetaminophen (TYLENOL PM) 25-500 MG TABS tablet Take 1 tablet by mouth at bedtime as needed.   Yes [provider]  Octreotide Acetate 50 MCG/ML SOSY Inject 1 ml ( ) into the skin every 8 hours. 07/30/22  Yes Ronnald Nian, MD  STELARA 90 MG/ML SOSY injection Inject 90 mg into the skin every 3 (three) months. 01/22/23  Yes [provider]  loperamide (IMODIUM) 2 MG capsule Take 1 capsule (2 mg total) by mouth 3 (three) times daily as needed for diarrhea or loose stools. Patient not taking: Reported on 09/29/2023 07/03/22   Albertine Grates, MD  SYRINGE-NEEDLE, DISP, 3 ML 25G X 5/8" 3 ML MISC Use as directed for octreotide injection 07/03/22   Albertine Grates, MD     Vital Signs: BP 116/76 (BP Location: Right Arm)   Pulse 60   Temp 98 F (36.7 C) (Oral)   Resp 16   Ht 5\' 4"  (1.626 m)   Wt 130 lb (59 kg)   LMP 06/10/2011 (Exact Date)   SpO2 100%   BMI 22.31 kg/m   Physical Exam awake/alert; left PCN intact, insertion site clean, dry, intact, minimally tender. OP  light yellow urine. Drain flushed with minimal return.  Imaging: IR NEPHROSTOMY PLACEMENT LEFT  Result Date: 10/10/2023 INDICATION: 61 year with new left staghorn calculus and hydronephrosis. EXAM: IR NEPHROSTOMY PLACEMENT LEFT COMPARISON:  None Available. MEDICATIONS: In patient already receiving intravenous antibiotics. No additional prophylaxis was administered. ANESTHESIA/SEDATION: Fentanyl 2 mcg IV; Versed 100 mg IV administered by  the radiology nurse Moderate Sedation Time:  10 minutes The patient's vital signs and level consciousness were continuously monitored during the procedure by the interventional radiology nurse under my direct supervision. CONTRAST:  15mL OMNIPAQUE IOHEXOL 300 MG/ML SOLN - administered into the collecting system(s) FLUOROSCOPY: Radiation exposure index: 8 mGy reference air kerma COMPLICATIONS: None immediate. TECHNIQUE: The procedure, risks, benefits, and alternatives were explained to the patient. Questions regarding the procedure were encouraged and answered. The patient understands and consents to the procedure. The left flank was prepped with chlorhexidine in a sterile fashion, and a sterile drape was applied covering the operative field. A sterile gown and sterile gloves were used for the procedure. Local anesthesia was provided with 1% Lidocaine. The left flank was interrogated with ultrasound and the left kidney identified. The kidney is hydronephrotic. A suitable access site on the skin overlying the lower pole, posterior calix was identified. After local mg anesthesia was achieved, a small skin nick was made with an 11 blade scalpel. A 21 gauge Accustick needle was then advanced under direct sonographic guidance into the lower pole of the left kidney. A 0.018 inch wire was advanced under fluoroscopic guidance into the left renal collecting system. The Accustick sheath was then advanced over the wire and a 0.018 system exchanged for a 0.035 system. Gentle hand injection of contrast material confirms placement of the sheath within the renal collecting system. There is hydronephrosis. Greater than expected filling defect within the renal pelvis and lower pole infundibulum. The tract from the scan into the renal  collecting system was then dilated serially to 10-French. A 10-French Cook all-purpose drain was then placed and positioned under fluoroscopic guidance. The locking loop is well formed within the left  renal pelvis. The catheter was secured to the skin with 0 Prolene and a sterile bandage was placed. Catheter was left to gravity bag drainage. IMPRESSION: Successful placement of a left 10 French percutaneous nephrostomy tube. Of note, the filling defect within the renal pelvis and lower pole infundibulum and calices is greater than expected based on the CT images from yesterday. This suggests that a large component of the stone burden is radiolucent, or that there is underlying soft tissue mass or blood products/thrombus as well as the visible radiopaque stone. Electronically Signed   By: Malachy Moan M.D.   On: 10/10/2023 16:24   CT ABDOMEN PELVIS WO CONTRAST  Result Date: 10/09/2023 CLINICAL DATA:  Abdominal pain, nausea and vomiting, history of Crohn disease EXAM: CT ABDOMEN AND PELVIS WITHOUT CONTRAST TECHNIQUE: Multidetector CT imaging of the abdomen and pelvis was performed following the standard protocol without IV contrast. Unenhanced CT was performed per clinician order. Lack of IV contrast limits sensitivity and specificity, especially for evaluation of abdominal/pelvic solid viscera. RADIATION DOSE REDUCTION: This exam was performed according to the departmental dose-optimization program which includes automated exposure control, adjustment of the mA and/or kV according to patient size and/or use of iterative reconstruction technique. COMPARISON:  07/01/2022 FINDINGS: Lower chest: Subpleural consolidation and volume loss within the medial aspect of the right lower lobe, favor atelectasis and/or scarring. No acute airspace disease. Hepatobiliary: Cholelithiasis without cholecystitis. Unremarkable unenhanced appearance of the liver. Pancreas: Unremarkable unenhanced appearance. Spleen: Unremarkable unenhanced appearance. Adrenals/Urinary Tract: 2.5 cm staghorn type calculus at the left UPJ, with resulting moderate to severe left hydronephrosis. The right kidney is unremarkable. The adrenals and  bladder are normal. Stomach/Bowel: Stable postsurgical changes from subtotal colectomy, Hartmann's pouch, right-sided ileostomy, and left-sided mucous fistula. No bowel obstruction or ileus. No bowel wall thickening or inflammatory change. Vascular/Lymphatic: Aortic atherosclerosis. No enlarged abdominal or pelvic lymph nodes. Reproductive: Status post hysterectomy. No adnexal masses. Other: No free fluid or free intraperitoneal gas. No abdominal wall hernia. Musculoskeletal: No acute or destructive bony abnormalities. Reconstructed images demonstrate no additional findings. IMPRESSION: 1. Obstructing 2.5 cm staghorn calculus at the left UPJ, with moderate to severe left-sided hydronephrosis. 2. Cholelithiasis without cholecystitis. 3. Extensive postsurgical changes of the bowel, stable since prior study. No obstruction or ileus. No inflammatory change. 4. Subpleural consolidation and volume loss within the medial basilar segment of the right lower lobe, likely scarring or atelectasis. 5.  Aortic Atherosclerosis (ICD10-I70.0). Electronically Signed   By: Sharlet Salina M.D.   On: 10/09/2023 22:15    Labs:  CBC: Recent Labs    10/10/23 1128 10/11/23 1015 10/12/23 0514 10/13/23 0507  WBC 33.0* 18.7* 14.2* 13.9*  HGB 14.2 11.7* 10.6* 10.2*  HCT 44.3 38.2 34.0* 33.4*  PLT 506* 358 319 298    COAGS: Recent Labs    10/10/23 0034  INR 1.8*    BMP: Recent Labs    10/10/23 1128 10/11/23 1015 10/12/23 0514 10/13/23 0507  NA 133* 130* 135 135  K 4.5 4.0 4.2 4.7  CL 99 104 107 108  CO2 18* 17* 19* 18*  GLUCOSE 112* 91 94 109*  BUN 86* 64* 48* 37*  CALCIUM 9.2 8.1* 8.2* 8.2*  CREATININE 4.82* 3.23* 2.51* 2.04*  GFRNONAA 10* 16* 21* 27*    LIVER FUNCTION TESTS: Recent Labs  09/29/23 1226 10/09/23 1956 10/10/23 1128  BILITOT <0.2 0.7 0.5  AST 11 22 21   ALT 8 10 19   ALKPHOS 89 61 90  PROT 7.4 4.9* 8.4*  ALBUMIN 4.2 2.0* 3.6    Assessment and Plan: 62 yo female with hx  significant sig for Crohn's disease s/p ileocolectomy, sigmoid colectomy with an ileostomy, recurrent UTIs, anxiety and depression, HTN and CKD 3B. Now with left UPJ staghorn calculus/hydronephrosis; s/p left PCN 12/7; afebrile, WBC 13.9, hgb 10.2, creat 2.04; blood cx with no growth; recent urine cx- proteus; cont current tx, monitor output /labs closely; further plans as outlined by urology   Electronically Signed: Sable Feil, PA-C 10/13/2023, 8:46 AM   I spent a total of 15 Minutes at the the patient's bedside AND on the patient's hospital floor or unit, greater than 50% of which was counseling/coordinating care for left percutaneous nephrostomy.

## 2023-10-13 NOTE — Progress Notes (Signed)
Progress Note    Cynthia [name]   IOE:[id number]  DOB: 1961-05-20  DOA: 10/09/2023     3 PCP: Ronnald Nian, MD  Initial CC: Weakness and fatigue, abdominal pain  Hospital Course: Cynthia [name] is a 62 y.o. female with medical history significant of Crohn's disease s/p ileocolectomy, sigmoid colectomy with an ileostomy, recurrent UTIs, anxiety and depression, HTN and CKD 3B. She presented with generalized weakness and not feeling well for at least a week prior to admission. She has had ongoing nausea and vomiting with right sided flank and back pain. She initially had loose stools from her ostomy bag but now has decreased output due to recent poor p.o. intake.  She reports mild abdominal cramps but denies chest pain, cough, dysuria hematemesis, hematuria, shortness of breath or bloody stools. CT abdomen/pelvis was performed on admission which showed obstructing 2.5 cm staghorn calculus at the left UPJ with moderate to severe left-sided hydronephrosis.  She was evaluated by urology on admission and recommended for left-sided percutaneous nephrostomy tube placement.  This was performed with IR on 10/10/2023.  Interval History:  Pain better controlled and back this morning.  Nephrostomy tube draining well.  Output does not appear to be recorded from yesterday.  Assessment and Plan: * Obstruction of left ureteropelvic junction (UPJ) due to stone - Patient presented with abdominal pain, weakness, and generalized malaise - CT showing obstructing 2.5 cm staghorn calculus at the left UPJ with moderate to severe left-sided hydronephrosis - Patient underwent left nephrostomy tube placement on 10/10/2023 with IR -Percutaneous nephrostomy tube exchange in 6 to 8 weeks per IR  Sepsis secondary to UTI (HCC)-resolved as of 10/13/2023 - Tachycardia, leukocytosis, urinary source from obstructed staghorn calculus - Continue Rocephin; see UTI  Acute cystitis with hematuria - UA noted with  large LE, negative nitrite, greater than 50 WBC - Suspected UTI from staghorn calculus - Continue Rocephin - urine culture growing Proteus; follow up for sensitivities   Acute renal failure superimposed on stage 3b chronic kidney disease (HCC) - patient has history of CKD3b. Baseline creat ~ 1.5, eGFR~ 40 - patient presents with increase in creat >0.3 mg/dL above baseline or creat increase >1.5x baseline presumed to have occurred within past 7 days PTA -Creatinine 3.41 on admission and peaked at 4.82 - Etiology suspected due to bladder outlet obstruction from staghorn calculus causing hydronephrosis - Renal function has improved after obstruction relief; continue trending for further improvement  - S/p fluids on admission -Continue oral intake for now - BMP in am   Contamination of blood culture - from admission 2/2 bottles (1 set) growing MRSE and staph hominis and 2nd set remains negative -Suspected contamination given polymicrobial  - Repeat blood cultures on 10/11/2023 and monitor; so far negative x 48 hours  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  Blood pressure currently controlled  Gastroesophageal reflux disease - No home meds noted on med rec   Old records reviewed in assessment of this patient  Antimicrobials: Rocephin 10/10/2023 >> current  DVT prophylaxis:  enoxaparin (LOVENOX) injection 30 mg Start: 10/10/23 1000   Code Status:   Code Status: Full Code  Mobility Assessment (Last 72 Hours)     Mobility Assessment     Row Name 10/13/23 0734 10/12/23  2020 10/12/23 0830 10/11/23 2000 10/11/23 0835   Does patient have an order for bedrest or is patient medically unstable No - Continue assessment No -  Continue assessment No - Continue assessment No - Continue assessment No - Continue assessment   What is the highest level of mobility based on the progressive mobility assessment? Level 6 (Walks independently in room and hall) - Balance while walking in room without assist - Complete Level 6 (Walks independently in room and hall) - Balance while walking in room without assist - Complete Level 6 (Walks independently in room and hall) - Balance while walking in room without assist - Complete Level 6 (Walks independently in room and hall) - Balance while walking in room without assist - Complete Level 5 (Walks with assist in room/hall) - Balance while stepping forward/back and can walk in room with assist - Complete    Row Name 10/10/23 2000           Does patient have an order for bedrest or is patient medically unstable No - Continue assessment       What is the highest level of mobility based on the progressive mobility assessment? Level 6 (Walks independently in room and hall) - Balance while walking in room without assist - Complete                Barriers to discharge: none Disposition Plan:  Home Status is: Inpt  Objective: Blood pressure 112/68, pulse 81, temperature 97.9 F (36.6 C), temperature source Oral, resp. rate 18, height 5\' 4"  (1.626 m), weight 59 kg, last menstrual period 06/10/2011, SpO2 99%.  Examination:  Physical Exam Constitutional:      Appearance: Normal appearance.  HENT:     Head: Normocephalic and atraumatic.     Mouth/Throat:     Mouth: Mucous membranes are moist.  Eyes:     Extraocular Movements: Extraocular movements intact.  Cardiovascular:     Rate and Rhythm: Normal rate and regular rhythm.  Pulmonary:     Effort: Pulmonary effort is normal. No respiratory distress.     Breath sounds: Normal breath sounds. No wheezing.  Abdominal:     General: Bowel sounds are normal. There is no distension.     Palpations: Abdomen is soft.     Tenderness:  There is no abdominal tenderness.     Comments: Ileostomy bag in place  Musculoskeletal:        General: Normal range of motion.     Cervical back: Normal range of motion and neck supple.  Skin:    General: Skin is warm and dry.  Neurological:     General: No focal deficit present.     Mental Status: She is alert.  Psychiatric:        Mood and Affect: Mood normal.      Consultants:  Urology IR  Procedures:  10/10/2023: Left nephrostomy tube placement  Data Reviewed: Results for orders placed or performed during the hospital encounter of 10/09/23 (from the past 24 hour(s))  Basic metabolic panel     Status: Abnormal   Collection Time: 10/13/23  5:07 AM  Result Value Ref Range   Sodium 135 135 - 145 mmol/L   Potassium 4.7 3.5 - 5.1 mmol/L   Chloride 108 98 - 111 mmol/L   CO2 18 (L) 22 - 32 mmol/L   Glucose, Bld 109 (H) 70 - 99 mg/dL   BUN 37 (H) 8 - 23 mg/dL   Creatinine, Ser 8.29 (H) 0.44 -  1.00 mg/dL   Calcium 8.2 (L) 8.9 - 10.3 mg/dL   GFR, Estimated 27 (L) >60 mL/min   Anion gap 9 5 - 15  CBC with Differential/Platelet     Status: Abnormal   Collection Time: 10/13/23  5:07 AM  Result Value Ref Range   WBC 13.9 (H) 4.0 - 10.5 K/uL   RBC 3.46 (L) 3.87 - 5.11 MIL/uL   Hemoglobin 10.2 (L) 12.0 - 15.0 g/dL   HCT 40.9 (L) 81.1 - 91.4 %   MCV 96.5 80.0 - 100.0 fL   MCH 29.5 26.0 - 34.0 pg   MCHC 30.5 30.0 - 36.0 g/dL   RDW 78.2 95.6 - 21.3 %   Platelets 298 150 - 400 K/uL   nRBC 0.0 0.0 - 0.2 %   Neutrophils Relative % 71 %   Neutro Abs 9.9 (H) 1.7 - 7.7 K/uL   Lymphocytes Relative 20 %   Lymphs Abs 2.7 0.7 - 4.0 K/uL   Monocytes Relative 6 %   Monocytes Absolute 0.8 0.1 - 1.0 K/uL   Eosinophils Relative 2 %   Eosinophils Absolute 0.3 0.0 - 0.5 K/uL   Basophils Relative 0 %   Basophils Absolute 0.0 0.0 - 0.1 K/uL   Immature Granulocytes 1 %   Abs Immature Granulocytes 0.19 (H) 0.00 - 0.07 K/uL  Magnesium     Status: None   Collection Time: 10/13/23  5:07 AM   Result Value Ref Range   Magnesium 1.7 1.7 - 2.4 mg/dL    I have reviewed pertinent nursing notes, vitals, labs, and images as necessary. I have ordered labwork to follow up on as indicated.  I have reviewed the last notes from staff over past 24 hours. I have discussed patient's care plan and test results with nursing staff, CM/SW, and other staff as appropriate.  Time spent: Greater than 50% of the 55 minute visit was spent in counseling/coordination of care for the patient as laid out in the A&P.   LOS: 3 days   Lewie Chamber, MD [facility] 10/13/2023, 3:24 PM

## 2023-10-13 NOTE — Plan of Care (Signed)
  Problem: Education: Goal: Knowledge of General Education information will improve Description: Including pain rating scale, medication(s)/side effects and non-pharmacologic comfort measures Outcome: Progressing   Problem: Activity: Goal: Risk for activity intolerance will decrease Outcome: Progressing   Problem: Nutrition: Goal: Adequate nutrition will be maintained Outcome: Progressing   Problem: Elimination: Goal: Will not experience complications related to bowel motility Outcome: Progressing   Problem: Pain Management: Goal: General experience of comfort will improve Outcome: Progressing   Problem: Safety: Goal: Ability to remain free from injury will improve Outcome: Progressing   Problem: Skin Integrity: Goal: Risk for impaired skin integrity will decrease Outcome: Progressing

## 2023-10-14 DIAGNOSIS — N390 Urinary tract infection, site not specified: Secondary | ICD-10-CM | POA: Diagnosis not present

## 2023-10-14 DIAGNOSIS — N201 Calculus of ureter: Secondary | ICD-10-CM | POA: Diagnosis not present

## 2023-10-14 DIAGNOSIS — A419 Sepsis, unspecified organism: Secondary | ICD-10-CM | POA: Diagnosis not present

## 2023-10-14 LAB — MAGNESIUM: Magnesium: 1.6 mg/dL — ABNORMAL LOW (ref 1.7–2.4)

## 2023-10-14 LAB — BASIC METABOLIC PANEL
Anion gap: 6 (ref 5–15)
BUN: 32 mg/dL — ABNORMAL HIGH (ref 8–23)
CO2: 20 mmol/L — ABNORMAL LOW (ref 22–32)
Calcium: 8.3 mg/dL — ABNORMAL LOW (ref 8.9–10.3)
Chloride: 105 mmol/L (ref 98–111)
Creatinine, Ser: 1.83 mg/dL — ABNORMAL HIGH (ref 0.44–1.00)
GFR, Estimated: 31 mL/min — ABNORMAL LOW (ref >60)
Glucose, Bld: 85 mg/dL (ref 70–99)
Potassium: 5 mmol/L (ref 3.5–5.1)
Sodium: 131 mmol/L — ABNORMAL LOW (ref 135–145)

## 2023-10-14 LAB — CBC WITH DIFFERENTIAL/PLATELET
Abs Immature Granulocytes: 0.24 10*3/uL — ABNORMAL HIGH (ref 0.00–0.07)
Basophils Absolute: 0 10*3/uL (ref 0.0–0.1)
Basophils Relative: 0 %
Eosinophils Absolute: 0.4 10*3/uL (ref 0.0–0.5)
Eosinophils Relative: 3 %
HCT: 34.4 % — ABNORMAL LOW (ref 36.0–46.0)
Hemoglobin: 10.8 g/dL — ABNORMAL LOW (ref 12.0–15.0)
Immature Granulocytes: 2 %
Lymphocytes Relative: 20 %
Lymphs Abs: 3 10*3/uL (ref 0.7–4.0)
MCH: 30 pg (ref 26.0–34.0)
MCHC: 31.4 g/dL (ref 30.0–36.0)
MCV: 95.6 fL (ref 80.0–100.0)
Monocytes Absolute: 0.8 10*3/uL (ref 0.1–1.0)
Monocytes Relative: 6 %
Neutro Abs: 10 10*3/uL — ABNORMAL HIGH (ref 1.7–7.7)
Neutrophils Relative %: 69 %
Platelets: 307 10*3/uL (ref 150–400)
RBC: 3.6 MIL/uL — ABNORMAL LOW (ref 3.87–5.11)
RDW: 12.8 % (ref 11.5–15.5)
WBC: 14.4 10*3/uL — ABNORMAL HIGH (ref 4.0–10.5)
nRBC: 0 % (ref 0.0–0.2)

## 2023-10-14 LAB — URINE CULTURE: Culture: 60000 — AB

## 2023-10-14 MED ORDER — MAGNESIUM OXIDE -MG SUPPLEMENT 400 (240 MG) MG PO TABS
400.0000 mg | ORAL_TABLET | Freq: Two times a day (BID) | ORAL | Status: DC
  Administered 2023-10-14 – 2023-10-15 (×3): 400 mg via ORAL
  Filled 2023-10-14 (×3): qty 1

## 2023-10-14 MED ORDER — MAGNESIUM SULFATE 2 GM/50ML IV SOLN
2.0000 g | Freq: Once | INTRAVENOUS | Status: AC
  Administered 2023-10-14: 2 g via INTRAVENOUS
  Filled 2023-10-14: qty 50

## 2023-10-14 MED ORDER — CEFAZOLIN SODIUM-DEXTROSE 2-4 GM/100ML-% IV SOLN
2.0000 g | Freq: Two times a day (BID) | INTRAVENOUS | Status: DC
  Administered 2023-10-14 – 2023-10-15 (×2): 2 g via INTRAVENOUS
  Filled 2023-10-14 (×2): qty 100

## 2023-10-14 NOTE — Progress Notes (Signed)
     Subjective: No acute events overnight.  Patient is feeling better and tolerating percutaneous nephrostomy tube well.  She states that she has not seen anyone from case management/social work regarding her housing insecurity.   Objective: Vital signs in last 24 hours: Temp:  [97.8 F (36.6 C)-98.3 F (36.8 C)] 98.3 F (36.8 C) (12/11 0452) Pulse Rate:  [58-81] 58 (12/11 0452) Resp:  [18] 18 (12/11 0452) BP: (112-144)/(68-83) 126/70 (12/11 0452) SpO2:  [99 %-100 %] 100 % (12/11 0452)  Assessment/Plan: # Left UPJ stone # AKI # Proteus UTI  Broad ABX per primary.  Sensitivities pending. S/p left percutaneous nephrostomy tube placement.  Draining clear yellow urine on rounds.  Patient is tolerating well and renal function is rapidly returning to baseline. Normothermic, improving leukocytosis Consulted case management involvement considering patient's housing insecurity. Ongoing improvement in renal indices.   Outpt percutaneous nephrolithotomy w/ Dr. Al[facility] will continue to follow  Intake/Output from previous day: 12/10 0701 - 12/11 0700 In: 720 [P.O.:720] Out: 1080 [Urine:280; Stool:800]  Intake/Output this shift: Total I/O In: 240 [P.O.:240] Out: -   Physical Exam:  General: Alert and oriented CV: No cyanosis Lungs: equal chest rise Abdomen: Soft, NTND, no rebound or guarding Gu: Left PCN T.  Clear yellow urine  Lab Results: Recent Labs    10/12/23 0514 10/13/23 0507 10/14/23 0528  HGB 10.6* 10.2* 10.8*  HCT 34.0* 33.4* 34.4*   BMET Recent Labs    10/13/23 0507 10/14/23 0528  NA 135 131*  K 4.7 5.0  CL 108 105  CO2 18* 20*  GLUCOSE 109* 85  BUN 37* 32*  CREATININE 2.04* 1.83*  CALCIUM 8.2* 8.3*     Studies/Results: No results found.    LOS: 4 days   Elmon Kirschner, NP [facility] Pager: [phone]  10/14/2023, 9:40 AM

## 2023-10-14 NOTE — Plan of Care (Signed)
  Problem: Activity: Goal: Risk for activity intolerance will decrease Outcome: Progressing   Problem: Nutrition: Goal: Adequate nutrition will be maintained Outcome: Progressing   Problem: Elimination: Goal: Will not experience complications related to bowel motility Outcome: Progressing   Problem: Pain Management: Goal: General experience of comfort will improve Outcome: Progressing   Problem: Safety: Goal: Ability to remain free from injury will improve Outcome: Progressing

## 2023-10-14 NOTE — Progress Notes (Addendum)
Spoke to patient regarding PCNL scheduling.   Patient denies  MI, DVT, PE, CVA, or CHF. Denies blood thinners.  Previously smoked, no COPD, OSA, or Asthma  We discussed risks benefits and alternaties to PCNL. We discussed the PCNL for a stone > 2.5 cm would provide the best stone free rate but the highest risks including bleeding, infection, and damage to he kidney and ureter. We discussed the possible need for blood vs embolization, possible injury to the ureter, need for stent and neph tube post op. Patient agreed to proceed with surgery .  Will schedule patient for surgery in the next few weeks

## 2023-10-14 NOTE — Progress Notes (Signed)
PROGRESS NOTE    Cynthia [name]  MVH:[id number] DOB: [birth year] DOA: 10/09/2023 PCP: Ronnald Nian, MD    Brief Narrative:  Cynthia [name] is a 62 y.o. female with medical history significant of Crohn's disease s/p ileocolectomy, sigmoid colectomy with an ileostomy, recurrent UTIs, anxiety and depression, HTN and CKD 3B presented to hospital with generalized weakness and not feeling well for a week with ongoing nausea and vomiting, right-sided flank and back pain.  She initially had some loose stool from ostomy bag. 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  Patient was seen by urology on admission and underwent left-sided percutaneous nephrostomy tube placement on 10/10/2023  Assessment and Plan:  * Obstruction of left ureteropelvic junction (UPJ) due to stone Status post nephrostomy tube placement on 10/10/2023 with IR with plans for Percutaneous nephrostomy tube exchange in 6 to 8 weeks per IR   Sepsis secondary to UTI -resolved.  On Rocephin.  Has mild leukocytosis at 14.4.  Temperature max of 98.3 F.   Acute Proteus cystitis with hematuria Continue Rocephin IV, sensitivities pending.   Acute renal failure superimposed on stage 3b chronic kidney disease (HCC) Baseline creatinine of around 1.5. -Creatinine 3.41 on admission and peaked at 4.82.  Etiology suspected due to obstruction from staghorn calculus causing hydronephrosis.  Status post PCN with creatinine at 1.8 today.  Mild hypomagnesia.  Magnesium of 1.6 today.  Will replace orally.    Contamination of blood culture Noted on blood culture admission 2/2 bottles (1 set) growing MRSE and staph hominis and 2nd set remains negative. Suspected contamination given polymicrobial.  Repeat blood cultures negative in 2 days.   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   Ileostomy bag in place.  Anxiety with depression Not on medication.   Benign essential HTN Not on meds.  Currently controlled.  Housing issues.  TOC consultation.    DVT prophylaxis: enoxaparin (LOVENOX) injection 30 mg Start: 10/10/23 1000   Code Status:     Code Status: Full Code  Disposition: Likely home on 10/15/2023  Status is: Inpatient Remains inpatient appropriate because: Pending clinical improvement, pending housing issues.   Family Communication: None at bedside  Consultants:  Urology  Procedures:  Percutaneous nephrostomy left  Antimicrobials:  Rocephin IV  Anti-infectives (From admission, onward)    Start     Dose/Rate Route Frequency Ordered Stop   10/10/23 2345  cefTRIAXone (ROCEPHIN) 2 g in sodium chloride 0.9 % 100 mL IVPB        2 g 200 mL/hr over 30 Minutes Intravenous Every 24 hours 10/10/23 1436     10/09/23 2230  cefTRIAXone (ROCEPHIN) 2 g in sodium chloride 0.9 % 100 mL IVPB        2 g 200 mL/hr over 30 Minutes Intravenous  Once 10/09/23 2222 10/10/23 0121        Subjective: Today, patient was seen and examined at bedside.  Denies any pain,vomiting, fever or chills but has nausea and mild pain in the left lower quadrant.  Feels slightly congested.  Did walk in the hospital..  Objective: Vitals:   10/13/23 0524 10/13/23 1346 10/13/23 2113 10/14/23 0452  BP: 116/76 112/68 (!) 144/83 126/70  Pulse: 60 81 79 (!) 58  Resp: 16 18 18 18   Temp: 98 F (36.7 C) 97.9 F (36.6 C) 97.8 F (36.6 C) 98.3 F (36.8 C)  TempSrc: Oral Oral Oral  Oral  SpO2: 100% 99% 100% 100%  Weight:      Height:        Intake/Output Summary (Last 24 hours) at 10/14/2023 1243 Last data filed at 10/14/2023 1002 Gross per 24 hour  Intake 720 ml  Output 1260 ml  Net -540 ml   Filed Weights   10/09/23 1842  Weight: 59 kg    Physical Examination: Body mass index is 22.31 kg/m.  General:  Average built, not in obvious distress HENT:   No scleral pallor  or icterus noted. Oral mucosa is moist.  Chest:  Clear breath sounds.  No crackles or wheezes.  CVS: S1 &S2 heard. No murmur.  Regular rate and rhythm. Abdomen: Soft, nontender, nondistended.  Bowel sounds are heard.  Left percutaneous nephrostomy tube in place.  Ileostomy bag in place in the right lower quadrant.. Extremities: No cyanosis, clubbing or edema.  Peripheral pulses are palpable. Psych: Alert, awake and oriented, normal mood CNS:  No cranial nerve deficits.  Moves all extremities.   Skin: Warm and dry.  No rashes noted.  Data Reviewed:   CBC: Recent Labs  Lab 10/10/23 1128 10/11/23 1015 10/12/23 0514 10/13/23 0507 10/14/23 0528  WBC 33.0* 18.7* 14.2* 13.9* 14.4*  NEUTROABS  --  15.4* 10.8* 9.9* 10.0*  HGB 14.2 11.7* 10.6* 10.2* 10.8*  HCT 44.3 38.2 34.0* 33.4* 34.4*  MCV 91.9 94.8 94.7 96.5 95.6  PLT 506* 358 319 298 307    Basic Metabolic Panel: Recent Labs  Lab 10/10/23 1128 10/11/23 1015 10/12/23 0514 10/13/23 0507 10/14/23 0528  NA 133* 130* 135 135 131*  K 4.5 4.0 4.2 4.7 5.0  CL 99 104 107 108 105  CO2 18* 17* 19* 18* 20*  GLUCOSE 112* 91 94 109* 85  BUN 86* 64* 48* 37* 32*  CREATININE 4.82* 3.23* 2.51* 2.04* 1.83*  CALCIUM 9.2 8.1* 8.2* 8.2* 8.3*  MG 2.6* 2.4 1.9 1.7 1.6*  PHOS 5.1*  --   --   --   --     Liver Function Tests: Recent Labs  Lab 10/09/23 1956 10/10/23 1128  AST 22 21  ALT 10 19  ALKPHOS 61 90  BILITOT 0.7 0.5  PROT 4.9* 8.4*  ALBUMIN 2.0* 3.6     Radiology Studies: No results found.    LOS: 4 days     Joycelyn Das, MD [facility] Available via Epic secure chat 7am-7pm After these hours, please refer to coverage provider listed on [email or web address] 10/14/2023, 12:43 PM

## 2023-10-14 NOTE — Plan of Care (Addendum)
Patient ambulating in the hallway independently, changed her own ostomy bag, and washed up.  Problem: Education: Goal: Knowledge of General Education information will improve Description: Including pain rating scale, medication(s)/side effects and non-pharmacologic comfort measures Outcome: Adequate for Discharge   Problem: Health Behavior/Discharge Planning: Goal: Ability to manage health-related needs will improve Outcome: Adequate for Discharge   Problem: Clinical Measurements: Goal: Ability to maintain clinical measurements within normal limits will improve Outcome: Adequate for Discharge Goal: Will remain free from infection Outcome: Adequate for Discharge Goal: Diagnostic test results will improve Outcome: Adequate for Discharge Goal: Respiratory complications will improve Outcome: Adequate for Discharge Goal: Cardiovascular complication will be avoided Outcome: Adequate for Discharge   Problem: Activity: Goal: Risk for activity intolerance will decrease Outcome: Adequate for Discharge   Problem: Nutrition: Goal: Adequate nutrition will be maintained Outcome: Adequate for Discharge   Problem: Coping: Goal: Level of anxiety will decrease Outcome: Adequate for Discharge   Problem: Elimination: Goal: Will not experience complications related to bowel motility Outcome: Adequate for Discharge Goal: Will not experience complications related to urinary retention Outcome: Adequate for Discharge   Problem: Pain Management: Goal: General experience of comfort will improve Outcome: Adequate for Discharge   Problem: Safety: Goal: Ability to remain free from injury will improve Outcome: Adequate for Discharge   Problem: Skin Integrity: Goal: Risk for impaired skin integrity will decrease Outcome: Adequate for Discharge

## 2023-10-15 DIAGNOSIS — N201 Calculus of ureter: Secondary | ICD-10-CM | POA: Diagnosis not present

## 2023-10-15 DIAGNOSIS — A419 Sepsis, unspecified organism: Secondary | ICD-10-CM | POA: Diagnosis not present

## 2023-10-15 DIAGNOSIS — N179 Acute kidney failure, unspecified: Secondary | ICD-10-CM | POA: Diagnosis not present

## 2023-10-15 DIAGNOSIS — N3001 Acute cystitis with hematuria: Secondary | ICD-10-CM | POA: Diagnosis not present

## 2023-10-15 LAB — CBC WITH DIFFERENTIAL/PLATELET
Abs Immature Granulocytes: 0.23 10*3/uL — ABNORMAL HIGH (ref 0.00–0.07)
Basophils Absolute: 0 10*3/uL (ref 0.0–0.1)
Basophils Relative: 0 %
Eosinophils Absolute: 0.5 10*3/uL (ref 0.0–0.5)
Eosinophils Relative: 4 %
HCT: 34.1 % — ABNORMAL LOW (ref 36.0–46.0)
Hemoglobin: 10.2 g/dL — ABNORMAL LOW (ref 12.0–15.0)
Immature Granulocytes: 2 %
Lymphocytes Relative: 20 %
Lymphs Abs: 2.7 10*3/uL (ref 0.7–4.0)
MCH: 29.3 pg (ref 26.0–34.0)
MCHC: 29.9 g/dL — ABNORMAL LOW (ref 30.0–36.0)
MCV: 98 fL (ref 80.0–100.0)
Monocytes Absolute: 0.8 10*3/uL (ref 0.1–1.0)
Monocytes Relative: 6 %
Neutro Abs: 9.3 10*3/uL — ABNORMAL HIGH (ref 1.7–7.7)
Neutrophils Relative %: 68 %
Platelets: 282 10*3/uL (ref 150–400)
RBC: 3.48 MIL/uL — ABNORMAL LOW (ref 3.87–5.11)
RDW: 12.9 % (ref 11.5–15.5)
WBC: 13.6 10*3/uL — ABNORMAL HIGH (ref 4.0–10.5)
nRBC: 0 % (ref 0.0–0.2)

## 2023-10-15 LAB — BASIC METABOLIC PANEL
Anion gap: 5 (ref 5–15)
BUN: 31 mg/dL — ABNORMAL HIGH (ref 8–23)
CO2: 20 mmol/L — ABNORMAL LOW (ref 22–32)
Calcium: 8.1 mg/dL — ABNORMAL LOW (ref 8.9–10.3)
Chloride: 108 mmol/L (ref 98–111)
Creatinine, Ser: 1.74 mg/dL — ABNORMAL HIGH (ref 0.44–1.00)
GFR, Estimated: 33 mL/min — ABNORMAL LOW (ref >60)
Glucose, Bld: 93 mg/dL (ref 70–99)
Potassium: 5.1 mmol/L (ref 3.5–5.1)
Sodium: 133 mmol/L — ABNORMAL LOW (ref 135–145)

## 2023-10-15 LAB — CULTURE, BLOOD (ROUTINE X 2): Culture: NO GROWTH

## 2023-10-15 LAB — MAGNESIUM: Magnesium: 1.8 mg/dL (ref 1.7–2.4)

## 2023-10-15 MED ORDER — OXYCODONE HCL 5 MG PO TABS: 5.0000 mg | ORAL_TABLET | Freq: Four times a day (QID) | ORAL | 0 refills | Status: AC | PRN

## 2023-10-15 MED ORDER — CEPHALEXIN 250 MG PO CAPS: 250.0000 mg | ORAL_CAPSULE | Freq: Four times a day (QID) | ORAL | 0 refills | Status: AC

## 2023-10-15 MED ORDER — ALUM & MAG HYDROXIDE-SIMETH 200-200-20 MG/5ML PO SUSP: 30.0000 mL | Freq: Four times a day (QID) | ORAL | 0 refills | Status: DC | PRN

## 2023-10-15 MED ORDER — SENNOSIDES-DOCUSATE SODIUM 8.6-50 MG PO TABS: 1.0000 | ORAL_TABLET | Freq: Two times a day (BID) | ORAL | 0 refills | Status: DC

## 2023-10-15 MED ORDER — LOPERAMIDE HCL 2 MG PO CAPS: 2.0000 mg | ORAL_CAPSULE | Freq: Three times a day (TID) | ORAL | 0 refills | Status: AC | PRN

## 2023-10-15 NOTE — Discharge Summary (Signed)
Physician Discharge Summary  Cynthia [name] GMW:[id number] DOB: January 21, 1961 DOA: 10/09/2023  PCP: Ronnald Nian, MD  Admit date: 10/09/2023 Discharge date: 10/15/2023  Admitted From: Home  Discharge disposition: Home    Recommendations for Outpatient Follow-Up:   Follow up with your primary care provider in one week.  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  Diet recommendation: Regular  Wound care: None.  Code status: Full.   History of Present Illness:   Cynthia [name] is a 62 y.o. female with medical history significant of Crohn's disease s/p ileocolectomy, sigmoid colectomy with an ileostomy, recurrent UTIs, anxiety and depression, HTN and CKD 3B presented to hospital with generalized weakness and not feeling well for a week with ongoing nausea and vomiting, right-sided flank and back pain.  She initially had some loose stool from ostomy bag. 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  Patient was seen by urology on admission and underwent left-sided percutaneous nephrostomy tube placement on 10/10/2023    Hospital Course:   Following conditions were addressed during hospitalization as listed below,  Obstruction of left ureteropelvic junction (UPJ) due to stone Status post  nephrostomy tube placement on 10/10/2023 with IR with plans for Percutaneous nephrostomy tube exchange in 6 to 8 weeks per IR.  Urology has plans for PCNL as outpatient.   Sepsis secondary to UTI -resolved.     Acute Proteus cystitis with hematuria Patient received IV Rocephin during hospitalization.  Will be changed to oral Keflex on discharge.   Acute renal failure superimposed on stage 3b chronic kidney disease (HCC) Baseline creatinine of around 1.5.  Creatinine 3.41 on admission and peaked at 4.82.  Etiology suspected due to obstruction from staghorn calculus causing hydronephrosis.  Status post PCN with creatinine at 1.7 today.   Mild hypomagnesia.  Proved after replacement.  Magnesium prior to discharge was 1.8.    Contamination of blood culture Noted on blood culture admission 2/2 bottles (1 set) growing MRSE and staph hominis and 2nd set remains negative. Suspected contamination given polymicrobial.  Repeat blood cultures negative in 4 days..   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  Ileostomy bag in place.   Anxiety with depression Not on medication.   Benign essential HTN Not on meds.  Currently controlled.   Housing issues.  TOC consultation and assistance with the lack of it at home..   Disposition.  At this time, patient is stable for disposition home with outpatient PCP, urology follow-up.  Medical Consultants:   Urology IR  Procedures:    Left percutaneous nephrostomy Subjective:   Today, patient was seen and examined at bedside.  Denies any nausea, vomiting, fever, chills or rigor.  Discharge Exam:   Vitals:   10/14/23 2026 10/15/23 0411  BP: 132/70 132/81  Pulse: 73 63  Resp: 16 17  Temp:  98 F (36.7 C) 97.6 F (36.4 C)  SpO2: 100% 100%   Vitals:   10/14/23 0452 10/14/23 1438 10/14/23 2026 10/15/23 0411  BP: 126/70 126/83 132/70 132/81  Pulse: (!) 58 74 73 63   Resp: 18 18 16 17   Temp: 98.3 F (36.8 C) 97.7 F (36.5 C) 98 F (36.7 C) 97.6 F (36.4 C)  TempSrc: Oral Oral Oral Oral  SpO2: 100% 98% 100% 100%  Weight:      Height:       Body mass index is 22.31 kg/m.   General: Alert awake, not in obvious distress HENT: pupils equally reacting to light,  No scleral pallor or icterus noted. Oral mucosa is moist.  Chest:  Clear breath sounds. No crackles or wheezes.  CVS: S1 &S2 heard. No murmur.  Regular rate and rhythm. Abdomen: Soft, nontender, nondistended.  Bowel sounds are heard.  Left percutaneous nephrostomy tube in place. Extremities: No cyanosis, clubbing or edema.  Peripheral pulses are palpable. Psych: Alert, awake and oriented, mildly anxious. CNS:  No cranial nerve deficits.  Power equal in all extremities.   Skin: Warm and dry.  No rashes noted.  The results of significant diagnostics from this hospitalization (including imaging, microbiology, ancillary and laboratory) are listed below for reference.     Diagnostic Studies:   IR NEPHROSTOMY PLACEMENT LEFT Result Date: 10/10/2023 INDICATION: 61 year with new left staghorn calculus and hydronephrosis. EXAM: IR NEPHROSTOMY PLACEMENT LEFT COMPARISON:  None Available. MEDICATIONS: In patient already receiving intravenous antibiotics. No additional prophylaxis was administered. ANESTHESIA/SEDATION: Fentanyl 2 mcg IV; Versed 100 mg IV administered by the radiology nurse Moderate Sedation Time:  10 minutes The patient's vital signs and level consciousness were continuously monitored during the procedure by the interventional radiology nurse under my direct supervision. CONTRAST:  15mL OMNIPAQUE IOHEXOL 300 MG/ML SOLN - administered into the collecting system(s) FLUOROSCOPY: Radiation exposure index: 8 mGy reference air kerma COMPLICATIONS: None immediate. TECHNIQUE: The procedure, risks, benefits, and alternatives were explained to the patient. Questions regarding the procedure were  encouraged and answered. The patient understands and consents to the procedure. The left flank was prepped with chlorhexidine in a sterile fashion, and a sterile drape was applied covering the operative field. A sterile gown and sterile gloves were used for the procedure. Local anesthesia was provided with 1% Lidocaine. The left flank was interrogated with ultrasound and the left kidney identified. The kidney is hydronephrotic. A suitable access site on the skin overlying the lower pole, posterior calix was identified. After local mg anesthesia was achieved, a small skin nick was made with an 11 blade scalpel. A 21 gauge Accustick needle was then advanced under direct sonographic guidance into the lower pole of the left kidney. A 0.018 inch wire was advanced under fluoroscopic guidance into the left renal collecting system. The Accustick sheath was then advanced over the wire and a 0.018 system exchanged for a 0.035 system. Gentle hand injection of contrast material confirms placement of the sheath within the renal collecting system. There is hydronephrosis. Greater than expected filling defect within the renal pelvis and lower pole infundibulum. The tract from the scan into the renal collecting system was then dilated serially to 10-French. A 10-French Cook all-purpose drain was then placed and positioned under fluoroscopic guidance. The locking loop is well formed within the left renal pelvis. The catheter was secured to the skin with 0 Prolene and a sterile bandage was placed. Catheter was left to gravity bag drainage. IMPRESSION: Successful placement  of a left 10 French percutaneous nephrostomy tube. Of note, the filling defect within the renal pelvis and lower pole infundibulum and calices is greater than expected based on the CT images from yesterday. This suggests that a large component of the stone burden is radiolucent, or that there is underlying soft tissue mass or blood products/thrombus as well as the  visible radiopaque stone. Electronically Signed   By: Malachy Moan M.D.   On: 10/10/2023 16:24   CT ABDOMEN PELVIS WO CONTRAST Result Date: 10/09/2023 CLINICAL DATA:  Abdominal pain, nausea and vomiting, history of Crohn disease EXAM: CT ABDOMEN AND PELVIS WITHOUT CONTRAST TECHNIQUE: Multidetector CT imaging of the abdomen and pelvis was performed following the standard protocol without IV contrast. Unenhanced CT was performed per clinician order. Lack of IV contrast limits sensitivity and specificity, especially for evaluation of abdominal/pelvic solid viscera. RADIATION DOSE REDUCTION: This exam was performed according to the departmental dose-optimization program which includes automated exposure control, adjustment of the mA and/or kV according to patient size and/or use of iterative reconstruction technique. COMPARISON:  07/01/2022 FINDINGS: Lower chest: Subpleural consolidation and volume loss within the medial aspect of the right lower lobe, favor atelectasis and/or scarring. No acute airspace disease. Hepatobiliary: Cholelithiasis without cholecystitis. Unremarkable unenhanced appearance of the liver. Pancreas: Unremarkable unenhanced appearance. Spleen: Unremarkable unenhanced appearance. Adrenals/Urinary Tract: 2.5 cm staghorn type calculus at the left UPJ, with resulting moderate to severe left hydronephrosis. The right kidney is unremarkable. The adrenals and bladder are normal. Stomach/Bowel: Stable postsurgical changes from subtotal colectomy, Hartmann's pouch, right-sided ileostomy, and left-sided mucous fistula. No bowel obstruction or ileus. No bowel wall thickening or inflammatory change. Vascular/Lymphatic: Aortic atherosclerosis. No enlarged abdominal or pelvic lymph nodes. Reproductive: Status post hysterectomy. No adnexal masses. Other: No free fluid or free intraperitoneal gas. No abdominal wall hernia. Musculoskeletal: No acute or destructive bony abnormalities. Reconstructed images  demonstrate no additional findings. IMPRESSION: 1. Obstructing 2.5 cm staghorn calculus at the left UPJ, with moderate to severe left-sided hydronephrosis. 2. Cholelithiasis without cholecystitis. 3. Extensive postsurgical changes of the bowel, stable since prior study. No obstruction or ileus. No inflammatory change. 4. Subpleural consolidation and volume loss within the medial basilar segment of the right lower lobe, likely scarring or atelectasis. 5.  Aortic Atherosclerosis (ICD10-I70.0). Electronically Signed   By: Sharlet Salina M.D.   On: 10/09/2023 22:15     Labs:   Basic Metabolic Panel: Recent Labs  Lab 10/10/23 1128 10/11/23 1015 10/12/23 0514 10/13/23 0507 10/14/23 0528 10/15/23 0610  NA 133* 130* 135 135 131* 133*  K 4.5 4.0 4.2 4.7 5.0 5.1  CL 99 104 107 108 105 108  CO2 18* 17* 19* 18* 20* 20*  GLUCOSE 112* 91 94 109* 85 93  BUN 86* 64* 48* 37* 32* 31*  CREATININE 4.82* 3.23* 2.51* 2.04* 1.83* 1.74*  CALCIUM 9.2 8.1* 8.2* 8.2* 8.3* 8.1*  MG 2.6* 2.4 1.9 1.7 1.6* 1.8  PHOS 5.1*  --   --   --   --   --    GFR Estimated Creatinine Clearance: 28.6 mL/min (A) (by C-G formula based on SCr of 1.74 mg/dL (H)). Liver Function Tests: Recent Labs  Lab 10/09/23 1956 10/10/23 1128  AST 22 21  ALT 10 19  ALKPHOS 61 90  BILITOT 0.7 0.5  PROT 4.9* 8.4*  ALBUMIN 2.0* 3.6   Recent Labs  Lab 10/09/23 1956  LIPASE 22   No results for input(s): "AMMONIA" in the last 168 hours. Coagulation profile  Recent Labs  Lab 10/10/23 0034  INR 1.8*    CBC: Recent Labs  Lab 10/11/23 1015 10/12/23 0514 10/13/23 0507 10/14/23 0528 10/15/23 0610  WBC 18.7* 14.2* 13.9* 14.4* 13.6*  NEUTROABS 15.4* 10.8* 9.9* 10.0* 9.3*  HGB 11.7* 10.6* 10.2* 10.8* 10.2*  HCT 38.2 34.0* 33.4* 34.4* 34.1*  MCV 94.8 94.7 96.5 95.6 98.0  PLT 358 319 298 307 282   Cardiac Enzymes: No results for input(s): "CKTOTAL", "CKMB", "CKMBINDEX", "TROPONINI" in the last 168 hours. BNP: Invalid  input(s): "POCBNP" CBG: No results for input(s): "GLUCAP" in the last 168 hours. D-Dimer No results for input(s): "DDIMER" in the last 72 hours. Hgb A1c No results for input(s): "HGBA1C" in the last 72 hours. Lipid Profile No results for input(s): "CHOL", "HDL", "LDLCALC", "TRIG", "CHOLHDL", "LDLDIRECT" in the last 72 hours. Thyroid function studies No results for input(s): "TSH", "T4TOTAL", "T3FREE", "THYROIDAB" in the last 72 hours.  Invalid input(s): "FREET3" Anemia work up No results for input(s): "VITAMINB12", "FOLATE", "FERRITIN", "TIBC", "IRON", "RETICCTPCT" in the last 72 hours. Microbiology Recent Results (from the past 240 hours)  Urine Culture     Status: Abnormal   Collection Time: 10/09/23 10:22 PM   Specimen: Urine, Catheterized  Result Value Ref Range Status   Specimen Description   Final    URINE, CATHETERIZED Performed at [facility], [address] [place], [place] [address]    Special Requests   Final    NONE Performed at [facility], [address], [place], [place] [address]    Culture 60,000 COLONIES/mL PROTEUS MIRABILIS (A)  Final   Report Status 10/14/2023 FINAL  Final   Organism ID, Bacteria PROTEUS MIRABILIS (A)  Final      Susceptibility   Proteus mirabilis - MIC*    AMPICILLIN <=2 SENSITIVE Sensitive     CEFAZOLIN <=4 SENSITIVE Sensitive     CEFEPIME <=0.12 SENSITIVE Sensitive     CEFTRIAXONE <=0.25 SENSITIVE Sensitive     CIPROFLOXACIN <=0.25 SENSITIVE Sensitive     GENTAMICIN <=1 SENSITIVE Sensitive     IMIPENEM 2 SENSITIVE Sensitive     NITROFURANTOIN 128 RESISTANT Resistant     TRIMETH/SULFA <=20 SENSITIVE Sensitive     AMPICILLIN/SULBACTAM <=2 SENSITIVE Sensitive     PIP/TAZO <=4 SENSITIVE Sensitive ug/mL    * 60,000 COLONIES/mL PROTEUS MIRABILIS  Culture, blood (routine x 2)     Status: Abnormal   Collection Time: 10/09/23 10:50 PM   Specimen: BLOOD LEFT FOREARM  Result Value Ref Range Status    Specimen Description   Final    BLOOD LEFT FOREARM Performed at [facility], [address] [place], [place] [address]    Special Requests   Final    BOTTLES DRAWN AEROBIC AND ANAEROBIC Blood Culture results may not be optimal due to an inadequate volume of blood received in culture bottles Performed at [facility], [address], [place], [place] [address]    Culture  Setup Time   Final    GRAM POSITIVE COCCI IN BOTH AEROBIC AND ANAEROBIC BOTTLES CRITICAL RESULT CALLED TO, READ BACK BY AND VERIFIED WITH: M LILLISTON,[email or web address]@0014  10/11/23 MK    Culture (A)  Final    STAPHYLOCOCCUS EPIDERMIDIS STAPHYLOCOCCUS HOMINIS THE SIGNIFICANCE OF ISOLATING THIS ORGANISM FROM A SINGLE SET OF BLOOD CULTURES WHEN MULTIPLE SETS ARE DRAWN IS UNCERTAIN. PLEASE NOTIFY THE MICROBIOLOGY DEPARTMENT WITHIN ONE WEEK IF SPECIATION AND SENSITIVITIES ARE REQUIRED. Performed at [facility], [address] [place], [place] [address]    Report Status 10/12/2023 FINAL  Final  Blood Culture ID Panel (Reflexed)     Status: Abnormal   Collection Time: 10/09/23 10:50 PM  Result Value Ref Range Status   Enterococcus faecalis NOT DETECTED NOT DETECTED Final   Enterococcus Faecium NOT DETECTED NOT DETECTED Final   Listeria monocytogenes NOT DETECTED NOT DETECTED Final   Staphylococcus species DETECTED (A) NOT DETECTED Final    Comment: CRITICAL RESULT CALLED TO, READ BACK BY AND VERIFIED WITH: M LILLISTON,[email or web address]@0014  10/11/23 MK    Staphylococcus aureus (BCID) NOT DETECTED NOT DETECTED Final   Staphylococcus epidermidis DETECTED (A) NOT DETECTED Final    Comment: Methicillin (oxacillin) resistant coagulase negative staphylococcus. Possible blood culture contaminant (unless isolated from more than one blood culture draw or clinical case suggests pathogenicity). No antibiotic treatment is indicated for blood  culture contaminants. CRITICAL RESULT CALLED TO, READ BACK BY  AND VERIFIED WITH: M LILLISTON,[email or web address]@0014  10/11/23 MK    Staphylococcus lugdunensis NOT DETECTED NOT DETECTED Final   Streptococcus species NOT DETECTED NOT DETECTED Final   Streptococcus agalactiae NOT DETECTED NOT DETECTED Final   Streptococcus pneumoniae NOT DETECTED NOT DETECTED Final   Streptococcus pyogenes NOT DETECTED NOT DETECTED Final   A.calcoaceticus-baumannii NOT DETECTED NOT DETECTED Final   Bacteroides fragilis NOT DETECTED NOT DETECTED Final   Enterobacterales NOT DETECTED NOT DETECTED Final   Enterobacter cloacae complex NOT DETECTED NOT DETECTED Final   Escherichia coli NOT DETECTED NOT DETECTED Final   Klebsiella aerogenes NOT DETECTED NOT DETECTED Final   Klebsiella oxytoca NOT DETECTED NOT DETECTED Final   Klebsiella pneumoniae NOT DETECTED NOT DETECTED Final   Proteus species NOT DETECTED NOT DETECTED Final   Salmonella species NOT DETECTED NOT DETECTED Final   Serratia marcescens NOT DETECTED NOT DETECTED Final   Haemophilus influenzae NOT DETECTED NOT DETECTED Final   Neisseria meningitidis NOT DETECTED NOT DETECTED Final   Pseudomonas aeruginosa NOT DETECTED NOT DETECTED Final   Stenotrophomonas maltophilia NOT DETECTED NOT DETECTED Final   Candida albicans NOT DETECTED NOT DETECTED Final   Candida auris NOT DETECTED NOT DETECTED Final   Candida glabrata NOT DETECTED NOT DETECTED Final   Candida krusei NOT DETECTED NOT DETECTED Final   Candida parapsilosis NOT DETECTED NOT DETECTED Final   Candida tropicalis NOT DETECTED NOT DETECTED Final   Cryptococcus neoformans/gattii NOT DETECTED NOT DETECTED Final   Methicillin resistance mecA/C DETECTED (A) NOT DETECTED Final    Comment: CRITICAL RESULT CALLED TO, READ BACK BY AND VERIFIED WITH: M LILLISTON,[email or web address]@0014  10/11/23 MK Performed at [facility], [address], [place], [place] [address]   Culture, blood (routine x 2)     Status: None   Collection Time: 10/09/23 11:30 PM   Specimen: BLOOD LEFT  FOREARM  Result Value Ref Range Status   Specimen Description   Final    BLOOD LEFT FOREARM Performed at [facility], [address], [place], [place] [address]    Special Requests   Final    BOTTLES DRAWN AEROBIC AND ANAEROBIC Blood Culture results may not be optimal due to an inadequate volume of blood received in culture bottles Performed at [facility], [address] [place], [place] [address]    Culture   Final    NO GROWTH 5 DAYS Performed at [facility], [address] [place], [place] [address]    Report Status 10/15/2023 FINAL  Final  Culture, blood (Routine X 2) w Reflex to  ID Panel     Status: None (Preliminary result)   Collection Time: 10/11/23 10:15 AM   Specimen: BLOOD  Result Value Ref Range Status   Specimen Description   Final    BLOOD BLOOD LEFT ARM Performed at [facility], [address], [place], [place] [address]    Special Requests   Final    BOTTLES DRAWN AEROBIC AND ANAEROBIC Blood Culture results may not be optimal due to an inadequate volume of blood received in culture bottles Performed at [facility], [address] [place], [place] [address]    Culture   Final    NO GROWTH 4 DAYS Performed at [facility], [address], [place], [place] [address]    Report Status PENDING  Incomplete  Culture, blood (Routine X 2) w Reflex to ID Panel     Status: None (Preliminary result)   Collection Time: 10/11/23 10:17 AM   Specimen: BLOOD  Result Value Ref Range Status   Specimen Description   Final    BLOOD BLOOD RIGHT HAND Performed at [facility], [address], [place], [place] [address]    Special Requests   Final    BOTTLES DRAWN AEROBIC ONLY Blood Culture results may not be optimal due to an inadequate volume of blood received in culture bottles Performed at [facility], [address], [place], [place] [address]     Culture   Final    NO GROWTH 4 DAYS Performed at [facility], [address], [place], [place] [address]    Report Status PENDING  Incomplete     Discharge Instructions:   Discharge Instructions     Call MD for:  persistant nausea and vomiting   Complete by: As directed    Call MD for:  severe uncontrolled pain   Complete by: As directed    Call MD for:  temperature >100.4   Complete by: As directed    Diet general   Complete by: As directed    Discharge instructions   Complete by: As directed    Follow-up with your primary care provider in 1 week. Follow-up with urology as outpatient for definitive treatment of stone, to be scheduled by the clinic.Marland Kitchen  Seek medical attention for worsening symptoms.  Increase fluid intake.  Complete course of antibiotic.   Increase activity slowly   Complete by: As directed    No wound care   Complete by: As directed       Allergies as of 10/15/2023       Reactions   Nsaids Other (See Comments)   Crohn's disease & CKD   Ace Inhibitors Cough   Iodine Other (See Comments)   The patient said she had a reaction from iodine in some eye drops- caused eyes to turn red;  pt has no reactions to CT contrast        Medication List     TAKE these medications    alum & mag hydroxide-simeth 200-200-20 MG/5ML suspension Commonly known as: MAALOX/MYLANTA Take 30 mLs by mouth every 6 (six) hours as needed for indigestion or heartburn.   cephALEXin 250 MG capsule Commonly known as: KEFLEX Take 1 capsule (250 mg total) by mouth 4 (four) times daily for 3 days.   diphenhydramine-acetaminophen 25-500 MG Tabs tablet Commonly known as: TYLENOL PM Take 1 tablet by mouth at bedtime as needed.   loperamide 2 MG capsule Commonly known as: IMODIUM Take 1  capsule (2 mg total) by mouth 3 (three) times daily as needed for diarrhea or loose stools.   Octreotide Acetate 50 MCG/ML Sosy Inject 1 ml ( ) into the skin every 8 hours.    oxyCODONE 5 MG immediate release tablet Commonly known as: Oxy IR/ROXICODONE Take 1 tablet (5 mg total) by mouth every 6 (six) hours as needed for up to 5 days for severe pain (pain score 7-10), breakthrough pain or moderate pain (pain score 4-6).   senna-docusate 8.6-50 MG tablet Commonly known as: Senokot-S Take 1 tablet by mouth 2 (two) times daily.   Stelara 90 MG/ML Sosy injection Generic drug: ustekinumab Inject 90 mg into the skin every 3 (three) months.   SYRINGE-NEEDLE (DISP) 3 ML 25G X 5/8" 3 ML Misc Use as directed for octreotide injection        Follow-up Information     Health, [facility] Follow up.   Specialty: Home Health Services Why: [facility] Department of social services to see what housing options are available for you. Contact information: [address] [place] [address] [phone]         Adonis Brook, MD Follow up.   Specialty: Urology Why: as scheduled by the clinic for management of stone Contact information: [address] [place] [place] [address] [phone]                  Time coordinating discharge: 39 minutes  Signed:  Aman Batley  [facility] 10/15/2023, 1:23 PM

## 2023-10-15 NOTE — Progress Notes (Signed)
Pt A&O, IV removed. Pt demonstrated ability to empty nephrostomy tube w/o assistance. AVS discussed w/ pt and pt's daughter. No further questions at this time. Pt ready for discharge.

## 2023-10-15 NOTE — TOC Transition Note (Addendum)
Transition of Care [facility]) - Discharge Note   Patient Details  Name: Cynthia [name] MRN: [id number] Date of Birth: 31-Mar-1961  Transition of Care [facility]) CM/SW Contact:  Beckie Busing, RN Phone Number:[phone]  10/15/2023, 11:04 AM   Clinical Narrative:    Patient discharging from hospital. Md has sent second consult for assistance with utilities/ housing at discharge. CM at bedside to offer patient resources low income utility assistance. Shelter resource information has previously been aded to AVS.  No other TOC needs noted. TOC will sign off.    Final next level of care: Homeless Shelter Barriers to Discharge: No Barriers Identified   Patient Goals and CMS Choice Patient states their goals for this hospitalization and ongoing recovery are:: To go to a shelter or hotel after discharge.          Discharge Placement                       Discharge Plan and Services Additional resources added to the After Visit Summary for   In-house Referral: Clinical Social Work                                   Social Drivers of Health (SDOH) Interventions SDOH Screenings   Food Insecurity: Food Insecurity Present (10/12/2023)  Housing: Patient Declined (10/11/2023)  Transportation Needs: No Transportation Needs (10/10/2023)  Utilities: At Risk (10/12/2023)  Depression (PHQ2-9): Low Risk  (09/29/2023)  Financial Resource Strain: Medium Risk (03/20/2021)   Received from [facility], [facility]  Social Connections: Unknown (03/05/2022)   Received from [facility]  Stress: Stress Concern Present (03/20/2021)   Received from [facility]  Tobacco Use: Medium Risk (10/10/2023)     Readmission Risk Interventions    08/20/2021    1:07 PM 04/09/2021    9:32 AM  Readmission Risk Prevention Plan  Transportation Screening Complete Complete  Medication Review (RN Care Manager) Complete Complete  PCP or Specialist appointment within 3-5 days of  discharge Complete Complete  HRI or Home Care Consult Complete Complete  SW Recovery Care/Counseling Consult Complete Complete  Palliative Care Screening Not Applicable Not Applicable  Skilled Nursing Facility Not Applicable Not Applicable

## 2023-10-15 NOTE — Plan of Care (Signed)

## 2023-10-15 NOTE — Care Management Important Message (Signed)
Important Message  Patient Details IM Letter given Name: Cynthia [name] MRN: [id number] Date of Birth: [birth year]   Important Message Given:  Yes - Medicare IM     Caren Macadam 10/15/2023, 2:06 PM

## 2023-10-16 ENCOUNTER — Telehealth: Payer: Self-pay

## 2023-10-16 LAB — CULTURE, BLOOD (ROUTINE X 2)
Culture: NO GROWTH
Culture: NO GROWTH
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
## 2023-10-20 ENCOUNTER — Encounter: Payer: Self-pay | Admitting: Family Medicine

## 2023-10-20 ENCOUNTER — Ambulatory Visit (INDEPENDENT_AMBULATORY_CARE_PROVIDER_SITE_OTHER): Payer: Medicare Other | Admitting: Family Medicine

## 2023-10-20 VITALS — BP 132/82 | HR 112 | Ht 64.0 in | Wt 127.2 lb

## 2023-10-20 DIAGNOSIS — K50012 Crohn's disease of small intestine with intestinal obstruction: Secondary | ICD-10-CM | POA: Diagnosis not present

## 2023-10-20 DIAGNOSIS — N183 Chronic kidney disease, stage 3 unspecified: Secondary | ICD-10-CM

## 2023-10-20 DIAGNOSIS — N200 Calculus of kidney: Secondary | ICD-10-CM

## 2023-10-20 DIAGNOSIS — N201 Calculus of ureter: Secondary | ICD-10-CM | POA: Diagnosis not present

## 2023-10-20 DIAGNOSIS — Z79899 Other long term (current) drug therapy: Secondary | ICD-10-CM | POA: Diagnosis not present

## 2023-10-20 NOTE — Progress Notes (Signed)
   Subjective:    Patient ID: Cynthia [name], female    DOB: 13-Oct-1961, 62 y.o.   MRN: [id number]  HPI She was recently seen in the hospital and evaluated for abdominal pain.  She was subsequently found to have a rather large kidney stone.  She is scheduled to be followed by urology and had the stone lithotripsied.  She continues to wear her colostomy bag and is having no difficulty with that.  She does have evidence of CKD.   Review of Systems     Objective:    Physical Exam Alert and in no distress.  Cardiac and lung exam is normal.  Abdominal exam shows no masses or tenderness.  She does have colostomy bag as well as a urostomy bag. The hospital record was thoroughly evaluated.      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  She will continue on her other present medication regimens and follow-up with me on an as-needed basis.

## 2023-10-21 LAB — CBC WITH DIFFERENTIAL/PLATELET
Basophils Absolute: 0 10*3/uL (ref 0.0–0.2)
Basos: 0 %
EOS (ABSOLUTE): 0.2 10*3/uL (ref 0.0–0.4)
Eos: 2 %
Hematocrit: 37 % (ref 34.0–46.6)
Hemoglobin: 12.4 g/dL (ref 11.1–15.9)
Immature Grans (Abs): 0.1 10*3/uL (ref 0.0–0.1)
Immature Granulocytes: 1 %
Lymphocytes Absolute: 2.2 10*3/uL (ref 0.7–3.1)
Lymphs: 17 %
MCH: 30.7 pg (ref 26.6–33.0)
MCHC: 33.5 g/dL (ref 31.5–35.7)
MCV: 92 fL (ref 79–97)
Monocytes Absolute: 0.7 10*3/uL (ref 0.1–0.9)
Monocytes: 5 %
Neutrophils Absolute: 9.6 10*3/uL — ABNORMAL HIGH (ref 1.4–7.0)
Neutrophils: 75 %
Platelets: 433 10*3/uL (ref 150–450)
RBC: 4.04 x10E6/uL (ref 3.77–5.28)
RDW: 12.2 % (ref 11.7–15.4)
WBC: 12.8 10*3/uL — ABNORMAL HIGH (ref 3.4–10.8)

## 2023-10-21 LAB — COMPREHENSIVE METABOLIC PANEL
ALT: 16 IU/L (ref 0–32)
AST: 17 IU/L (ref 0–40)
Albumin: 4.2 g/dL (ref 3.9–4.9)
Alkaline Phosphatase: 98 IU/L (ref 44–121)
BUN/Creatinine Ratio: 13 (ref 12–28)
BUN: 24 mg/dL (ref 8–27)
Bilirubin Total: 0.2 mg/dL (ref 0.0–1.2)
CO2: 16 mmol/L — ABNORMAL LOW (ref 20–29)
Calcium: 9.5 mg/dL (ref 8.7–10.3)
Chloride: 107 mmol/L — ABNORMAL HIGH (ref 96–106)
Creatinine, Ser: 1.8 mg/dL — ABNORMAL HIGH (ref 0.57–1.00)
Globulin, Total: 3.1 g/dL (ref 1.5–4.5)
Glucose: 148 mg/dL — ABNORMAL HIGH (ref 70–99)
Potassium: 5.4 mmol/L — ABNORMAL HIGH (ref 3.5–5.2)
Sodium: 140 mmol/L (ref 134–144)
Total Protein: 7.3 g/dL (ref 6.0–8.5)
eGFR: 31 mL/min/{1.73_m2} — ABNORMAL LOW (ref >59)

## 2023-10-21 LAB — MAGNESIUM: Magnesium: 1.6 mg/dL (ref 1.6–2.3)

## 2023-10-22 DIAGNOSIS — K50013 Crohn's disease of small intestine with fistula: Secondary | ICD-10-CM | POA: Diagnosis not present

## 2023-10-23 ENCOUNTER — Other Ambulatory Visit: Payer: Self-pay | Admitting: Urology

## 2023-11-02 ENCOUNTER — Emergency Department (HOSPITAL_COMMUNITY): Payer: Medicare Other

## 2023-11-02 ENCOUNTER — Inpatient Hospital Stay (HOSPITAL_COMMUNITY)
Admission: EM | Admit: 2023-11-02 | Discharge: 2023-11-07 | DRG: 683 | Disposition: A | Discharge status: Home / Self Care | Payer: Medicare Other | Attending: Internal Medicine | Admitting: Internal Medicine

## 2023-11-02 ENCOUNTER — Other Ambulatory Visit: Payer: Self-pay

## 2023-11-02 DIAGNOSIS — G2581 Restless legs syndrome: Secondary | ICD-10-CM | POA: Diagnosis not present

## 2023-11-02 DIAGNOSIS — B964 Proteus (mirabilis) (morganii) as the cause of diseases classified elsewhere: Secondary | ICD-10-CM | POA: Diagnosis not present

## 2023-11-02 DIAGNOSIS — Y833 Surgical operation with formation of external stoma as the cause of abnormal reaction of the patient, or of later complication, without mention of misadventure at the time of the procedure: Secondary | ICD-10-CM | POA: Diagnosis present

## 2023-11-02 DIAGNOSIS — D631 Anemia in chronic kidney disease: Secondary | ICD-10-CM | POA: Diagnosis not present

## 2023-11-02 DIAGNOSIS — Z933 Colostomy status: Secondary | ICD-10-CM

## 2023-11-02 DIAGNOSIS — F419 Anxiety disorder, unspecified: Secondary | ICD-10-CM | POA: Diagnosis present

## 2023-11-02 DIAGNOSIS — Z90710 Acquired absence of both cervix and uterus: Secondary | ICD-10-CM | POA: Diagnosis not present

## 2023-11-02 DIAGNOSIS — I129 Hypertensive chronic kidney disease with stage 1 through stage 4 chronic kidney disease, or unspecified chronic kidney disease: Secondary | ICD-10-CM | POA: Diagnosis present

## 2023-11-02 DIAGNOSIS — Z5901 Sheltered homelessness: Secondary | ICD-10-CM | POA: Diagnosis not present

## 2023-11-02 DIAGNOSIS — E872 Acidosis, unspecified: Secondary | ICD-10-CM | POA: Diagnosis present

## 2023-11-02 DIAGNOSIS — Z888 Allergy status to other drugs, medicaments and biological substances status: Secondary | ICD-10-CM

## 2023-11-02 DIAGNOSIS — R7303 Prediabetes: Secondary | ICD-10-CM | POA: Diagnosis present

## 2023-11-02 DIAGNOSIS — K50012 Crohn's disease of small intestine with intestinal obstruction: Secondary | ICD-10-CM | POA: Diagnosis present

## 2023-11-02 DIAGNOSIS — I341 Nonrheumatic mitral (valve) prolapse: Secondary | ICD-10-CM | POA: Diagnosis present

## 2023-11-02 DIAGNOSIS — R198 Other specified symptoms and signs involving the digestive system and abdomen: Secondary | ICD-10-CM

## 2023-11-02 DIAGNOSIS — N1832 Chronic kidney disease, stage 3b: Secondary | ICD-10-CM | POA: Diagnosis present

## 2023-11-02 DIAGNOSIS — N390 Urinary tract infection, site not specified: Secondary | ICD-10-CM | POA: Insufficient documentation

## 2023-11-02 DIAGNOSIS — N201 Calculus of ureter: Secondary | ICD-10-CM | POA: Diagnosis not present

## 2023-11-02 DIAGNOSIS — T8384XA Pain from genitourinary prosthetic devices, implants and grafts, initial encounter: Secondary | ICD-10-CM | POA: Diagnosis present

## 2023-11-02 DIAGNOSIS — K5090 Crohn's disease, unspecified, without complications: Secondary | ICD-10-CM | POA: Diagnosis not present

## 2023-11-02 DIAGNOSIS — E860 Dehydration: Secondary | ICD-10-CM | POA: Diagnosis not present

## 2023-11-02 DIAGNOSIS — Z743 Need for continuous supervision: Secondary | ICD-10-CM | POA: Diagnosis not present

## 2023-11-02 DIAGNOSIS — K5000 Crohn's disease of small intestine without complications: Secondary | ICD-10-CM | POA: Diagnosis present

## 2023-11-02 DIAGNOSIS — M549 Dorsalgia, unspecified: Secondary | ICD-10-CM | POA: Diagnosis present

## 2023-11-02 DIAGNOSIS — Z860100 Personal history of colon polyps, unspecified: Secondary | ICD-10-CM

## 2023-11-02 DIAGNOSIS — G8929 Other chronic pain: Secondary | ICD-10-CM | POA: Diagnosis not present

## 2023-11-02 DIAGNOSIS — N183 Chronic kidney disease, stage 3 unspecified: Secondary | ICD-10-CM | POA: Diagnosis present

## 2023-11-02 DIAGNOSIS — Z87891 Personal history of nicotine dependence: Secondary | ICD-10-CM | POA: Diagnosis not present

## 2023-11-02 DIAGNOSIS — R109 Unspecified abdominal pain: Secondary | ICD-10-CM | POA: Diagnosis not present

## 2023-11-02 DIAGNOSIS — K8020 Calculus of gallbladder without cholecystitis without obstruction: Secondary | ICD-10-CM | POA: Diagnosis not present

## 2023-11-02 DIAGNOSIS — N179 Acute kidney failure, unspecified: Secondary | ICD-10-CM | POA: Diagnosis not present

## 2023-11-02 DIAGNOSIS — Z1152 Encounter for screening for COVID-19: Secondary | ICD-10-CM

## 2023-11-02 DIAGNOSIS — R000 Tachycardia, unspecified: Secondary | ICD-10-CM | POA: Diagnosis present

## 2023-11-02 DIAGNOSIS — F32A Depression, unspecified: Secondary | ICD-10-CM | POA: Diagnosis present

## 2023-11-02 DIAGNOSIS — R197 Diarrhea, unspecified: Secondary | ICD-10-CM | POA: Diagnosis present

## 2023-11-02 DIAGNOSIS — Z886 Allergy status to analgesic agent status: Secondary | ICD-10-CM

## 2023-11-02 DIAGNOSIS — A419 Sepsis, unspecified organism: Secondary | ICD-10-CM | POA: Diagnosis not present

## 2023-11-02 DIAGNOSIS — E861 Hypovolemia: Secondary | ICD-10-CM | POA: Diagnosis not present

## 2023-11-02 DIAGNOSIS — I499 Cardiac arrhythmia, unspecified: Secondary | ICD-10-CM | POA: Diagnosis not present

## 2023-11-02 DIAGNOSIS — E876 Hypokalemia: Secondary | ICD-10-CM | POA: Diagnosis not present

## 2023-11-02 DIAGNOSIS — K219 Gastro-esophageal reflux disease without esophagitis: Secondary | ICD-10-CM | POA: Diagnosis present

## 2023-11-02 DIAGNOSIS — Z79899 Other long term (current) drug therapy: Secondary | ICD-10-CM

## 2023-11-02 DIAGNOSIS — L2489 Irritant contact dermatitis due to other agents: Secondary | ICD-10-CM | POA: Diagnosis not present

## 2023-11-02 DIAGNOSIS — Z8619 Personal history of other infectious and parasitic diseases: Secondary | ICD-10-CM

## 2023-11-02 DIAGNOSIS — R140 Abdominal distension (gaseous): Secondary | ICD-10-CM | POA: Diagnosis not present

## 2023-11-02 DIAGNOSIS — N200 Calculus of kidney: Secondary | ICD-10-CM | POA: Diagnosis not present

## 2023-11-02 LAB — COMPREHENSIVE METABOLIC PANEL
ALT: 10 U/L (ref 0–44)
AST: 13 U/L — ABNORMAL LOW (ref 15–41)
Albumin: 3.5 g/dL (ref 3.5–5.0)
Alkaline Phosphatase: 74 U/L (ref 38–126)
Anion gap: 12 (ref 5–15)
BUN: 63 mg/dL — ABNORMAL HIGH (ref 8–23)
CO2: 17 mmol/L — ABNORMAL LOW (ref 22–32)
Calcium: 8.5 mg/dL — ABNORMAL LOW (ref 8.9–10.3)
Chloride: 100 mmol/L (ref 98–111)
Creatinine, Ser: 2.78 mg/dL — ABNORMAL HIGH (ref 0.44–1.00)
GFR, Estimated: 18 mL/min — ABNORMAL LOW (ref >60)
Glucose, Bld: 97 mg/dL (ref 70–99)
Potassium: 3.5 mmol/L (ref 3.5–5.1)
Sodium: 129 mmol/L — ABNORMAL LOW (ref 135–145)
Total Bilirubin: 0.7 mg/dL (ref <1.2)
Total Protein: 7.4 g/dL (ref 6.5–8.1)

## 2023-11-02 LAB — CBC WITH DIFFERENTIAL/PLATELET
Abs Immature Granulocytes: 0.07 10*3/uL (ref 0.00–0.07)
Basophils Absolute: 0 10*3/uL (ref 0.0–0.1)
Basophils Relative: 0 %
Eosinophils Absolute: 0.2 10*3/uL (ref 0.0–0.5)
Eosinophils Relative: 2 %
HCT: 38.5 % (ref 36.0–46.0)
Hemoglobin: 12.8 g/dL (ref 12.0–15.0)
Immature Granulocytes: 1 %
Lymphocytes Relative: 20 %
Lymphs Abs: 2.3 10*3/uL (ref 0.7–4.0)
MCH: 29.7 pg (ref 26.0–34.0)
MCHC: 33.2 g/dL (ref 30.0–36.0)
MCV: 89.3 fL (ref 80.0–100.0)
Monocytes Absolute: 0.9 10*3/uL (ref 0.1–1.0)
Monocytes Relative: 8 %
Neutro Abs: 8.2 10*3/uL — ABNORMAL HIGH (ref 1.7–7.7)
Neutrophils Relative %: 69 %
Platelets: 306 10*3/uL (ref 150–400)
RBC: 4.31 MIL/uL (ref 3.87–5.11)
RDW: 12.9 % (ref 11.5–15.5)
WBC: 11.7 10*3/uL — ABNORMAL HIGH (ref 4.0–10.5)
nRBC: 0 % (ref 0.0–0.2)

## 2023-11-02 LAB — URINALYSIS, W/ REFLEX TO CULTURE (INFECTION SUSPECTED)
Bilirubin Urine: NEGATIVE
Glucose, UA: NEGATIVE mg/dL
Hgb urine dipstick: NEGATIVE
Ketones, ur: NEGATIVE mg/dL
Nitrite: NEGATIVE
Protein, ur: >=300 mg/dL — AB
Specific Gravity, Urine: 1.015 (ref 1.005–1.030)
pH: 9 — ABNORMAL HIGH (ref 5.0–8.0)

## 2023-11-02 LAB — PROTIME-INR
INR: 1.4 — ABNORMAL HIGH (ref 0.8–1.2)
Prothrombin Time: 17.4 s — ABNORMAL HIGH (ref 11.4–15.2)

## 2023-11-02 LAB — RESP PANEL BY RT-PCR (RSV, FLU A&B, COVID)  RVPGX2
Influenza A by PCR: NEGATIVE
Influenza B by PCR: NEGATIVE
Resp Syncytial Virus by PCR: NEGATIVE
SARS Coronavirus 2 by RT PCR: NEGATIVE

## 2023-11-02 LAB — MAGNESIUM: Magnesium: 2.2 mg/dL (ref 1.7–2.4)

## 2023-11-02 LAB — APTT: aPTT: 25 s (ref 24–36)

## 2023-11-02 LAB — I-STAT CG4 LACTIC ACID, ED: Lactic Acid, Venous: 1.9 mmol/L (ref 0.5–1.9)

## 2023-11-02 MED ORDER — SODIUM CHLORIDE 0.9 % IV SOLN: INTRAVENOUS | Status: AC

## 2023-11-02 MED ORDER — LOPERAMIDE HCL 2 MG PO CAPS
2.0000 mg | ORAL_CAPSULE | Freq: Once | ORAL | Status: AC
  Administered 2023-11-02: 2 mg via ORAL
  Filled 2023-11-02: qty 1

## 2023-11-02 MED ORDER — LOPERAMIDE HCL 2 MG PO CAPS: 2.0000 mg | ORAL_CAPSULE | Freq: Three times a day (TID) | ORAL | Status: DC | PRN

## 2023-11-02 MED ORDER — ACETAMINOPHEN 650 MG RE SUPP: 650.0000 mg | Freq: Four times a day (QID) | RECTAL | Status: DC | PRN

## 2023-11-02 MED ORDER — TRAZODONE HCL 50 MG PO TABS
25.0000 mg | ORAL_TABLET | Freq: Every evening | ORAL | Status: DC | PRN
  Administered 2023-11-02 – 2023-11-06 (×5): 25 mg via ORAL
  Filled 2023-11-02 (×5): qty 1

## 2023-11-02 MED ORDER — HEPARIN SODIUM (PORCINE) 5000 UNIT/ML IJ SOLN
5000.0000 [IU] | Freq: Three times a day (TID) | INTRAMUSCULAR | Status: DC
  Administered 2023-11-02 – 2023-11-07 (×15): 5000 [IU] via SUBCUTANEOUS
  Filled 2023-11-02 (×15): qty 1

## 2023-11-02 MED ORDER — ONDANSETRON HCL 4 MG/2ML IJ SOLN
4.0000 mg | Freq: Four times a day (QID) | INTRAMUSCULAR | Status: DC | PRN
  Filled 2023-11-02: qty 2

## 2023-11-02 MED ORDER — SODIUM CHLORIDE 0.9 % IV BOLUS
1000.0000 mL | Freq: Once | INTRAVENOUS | Status: AC
  Administered 2023-11-02: 1000 mL via INTRAVENOUS

## 2023-11-02 MED ORDER — OCTREOTIDE ACETATE 50 MCG/ML IJ SOLN
50.0000 ug | Freq: Three times a day (TID) | INTRAMUSCULAR | Status: DC
  Administered 2023-11-02 – 2023-11-07 (×16): 50 ug via SUBCUTANEOUS
  Filled 2023-11-02 (×16): qty 1

## 2023-11-02 MED ORDER — POTASSIUM CHLORIDE CRYS ER 20 MEQ PO TBCR
40.0000 meq | EXTENDED_RELEASE_TABLET | Freq: Once | ORAL | Status: AC
  Administered 2023-11-02: 40 meq via ORAL
  Filled 2023-11-02: qty 2

## 2023-11-02 MED ORDER — ONDANSETRON HCL 4 MG PO TABS: 4.0000 mg | ORAL_TABLET | Freq: Four times a day (QID) | ORAL | Status: DC | PRN

## 2023-11-02 MED ORDER — LACTATED RINGERS IV BOLUS
1000.0000 mL | Freq: Once | INTRAVENOUS | Status: AC
Start: 2023-11-02 — End: 2023-11-02
  Administered 2023-11-02: 1000 mL via INTRAVENOUS

## 2023-11-02 MED ORDER — ACETAMINOPHEN 325 MG PO TABS
650.0000 mg | ORAL_TABLET | Freq: Four times a day (QID) | ORAL | Status: DC | PRN
Start: 2023-11-02 — End: 2023-11-07
  Administered 2023-11-03 – 2023-11-06 (×3): 650 mg via ORAL
  Filled 2023-11-02 (×4): qty 2

## 2023-11-02 MED ORDER — OXYCODONE-ACETAMINOPHEN 5-325 MG PO TABS
1.0000 | ORAL_TABLET | Freq: Once | ORAL | Status: AC
  Administered 2023-11-02: 1 via ORAL
  Filled 2023-11-02: qty 1

## 2023-11-02 MED ORDER — CEFTRIAXONE SODIUM 1 G IJ SOLR
1.0000 g | Freq: Once | INTRAMUSCULAR | Status: AC
  Administered 2023-11-02: 1 g via INTRAVENOUS
  Filled 2023-11-02: qty 10

## 2023-11-02 NOTE — ED Notes (Signed)
Placed colostomy bag on due to patient agreeing to it.

## 2023-11-02 NOTE — ED Provider Notes (Signed)
  Physical Exam  BP 117/74   Pulse 60   Temp 97.6 F (36.4 C) (Oral)   Resp (!) 26   LMP 06/10/2011 (Exact Date)   SpO2 100%   Physical Exam  Procedures  Procedures  ED Course / MDM   Clinical Course as of 11/02/23 0718  Mon Nov 02, 2023  0626 Spoke to the patient's son-in-law, Jamison Neighbor.  He works for [employer].  He is requesting social work evaluation as the patient has significant housing instability and essentially does not have a place to go.  She also has complicated medical problems.  Consult placed.  Discussed with him that she will likely be discharged with resources. [CH]    Clinical Course User Index [CH] Horton, Mayer Masker, MD    Received care of patient from Dr. Wilkie Aye.  Please see her note for prior history, physical and care.  Briefly this 62 year old female with history of Crohn's disease status post colon colostomy, nephrostomy tube in place who presents with concern for increased ostomy output, irritation around her stoma.  Had labs completed which showed negative COVID, influenza and RSV testing, normal lactic acid, acute on chronic kidney disease, hyponatremia, non-anion gap metabolic acidosis likely secondary to dehydration.  CT imaging was without acute changes, no hydroureter.  She had been given IV fluids. Family concerned regarding her housing instability. Awaiting UA and TOC consult at this time.    On my reevaluation she has been filling up her ileostomy bag every hour.  Her blood pressures 98/73.  UA looks consistent with urinary tract infection, also with yeast present.  Gave Rocephin with concern for UTI.  CT shows nephrostomy tube in place without signs of abscess.  Give additional fluids with concern for high ostomy output.  Given her high ostomy output with signs of acute on chronic kidney disease, hyponatremia and non-anion gap metabolic acidosis will admit for IV hydration and continued care.  Ordered a dose of loperamide to help slow  output.         Alvira Monday, MD 11/02/23 1030

## 2023-11-02 NOTE — H&P (Signed)
History and Physical  Cynthia [name] DOB: 27-Apr-1961 DOA: 11/02/2023  PCP: Ronnald Nian, MD   Chief Complaint: Abdominal pain, diarrhea  HPI: Cynthia [name] is a 62 y.o. female with medical history significant for Crohn's disease status post ileocolectomy, sigmoid colectomy with ileostomy, anxiety, depression, hypertension, CKD stage IIIb with recent hospital stay earlier this month and nephrostomy tube placement due to left UPJ obstructing stone being admitted to the hospital with recurrent chronic diarrhea, and acute renal failure superimposed on CKD stage IIIb.  She denies any fevers, chills, nausea, vomiting, dysuria, urgency or frequency of urination.  She has had some abdominal pain around the site of her ileostomy, which is not typical for her.  She does intermittently have chronic diarrhea, was doing well since hospital discharge until about 1 week ago when it became quite copious.  Says that her ostomy bag has been filling up in a matter of just a couple of hours.  Thinks she has been eating and drinking pretty well, though she has been staying in a hotel so her oral intake is not the same as it was previously.  On evaluation in the emergency department, she does have acute on chronic renal failure, and there was concern for UTI show she was started on IV Rocephin.  Due to continued high output from her ostomy and concern for continued/worsening renal failure, hospitalist was contacted for admission.  Review of Systems: Please see HPI for pertinent positives and negatives. A complete 10 system review of systems are otherwise negative.  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   GERD (gastroesophageal reflux disease)    Headache(784.0)    MIGRAINE   Heart murmur    Hiatal hernia    History of noncompliance with medical treatment 04/13/2022   History of rectal polyp 2020 01/12/2021   History of serrated colonic polyp 01/13/2011   Hypertension    Incontinence    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HCC)    MVP (mitral valve prolapse)    RLS (restless legs syndrome) 09/25/2014   Sepsis due to cellulitis (HCC) 04/05/2021   Past Surgical History:  Procedure Laterality Date   ABDOMINAL HYSTERECTOMY  06/17/2011   Procedure: HYSTERECTOMY ABDOMINAL;  Surgeon: Melony Overly;  Location: [facility];  Service: Gynecology;  Laterality: N/A;  Converted to Abdominal Hysterectomy with lysis of adhesions    ANTERIOR AND POSTERIOR REPAIR  02/25/2012   Procedure: ANTERIOR (CYSTOCELE) AND POSTERIOR REPAIR (RECTOCELE);  Surgeon: Loney Laurence, MD;  Location: [facility];  Service: Gynecology;  Laterality: N/A;  ANterior cystocele repair ONLY   BIOPSY  08/29/2019   Procedure: BIOPSY;  Surgeon: Shellia Cleverly, DO;  Location: [facility];  Service: Gastroenterology;;   BLADDER SUSPENSION  02/25/2012   Procedure: TRANSVAGINAL TAPE (TVT) PROCEDURE;  Surgeon: Loney Laurence, MD;  Location: [facility];  Service: Gynecology;  Laterality: N/A;   COLECTOMY WITH COLOSTOMY CREATION/HARTMANN PROCEDURE  02/19/2021   Sigmoid colectomy w descnding colon MF/ostomy   COLONOSCOPY WITH PROPOFOL N/A 08/29/2019   Procedure: COLONOSCOPY WITH PROPOFOL;  Surgeon: Shellia Cleverly, DO;  Location: [facility];  Service: Gastroenterology;  Laterality: N/A;   CYSTOSCOPY  02/25/2012   Procedure: CYSTOSCOPY;  Surgeon: Loney Laurence, MD;  Location: [facility];  Service: Gynecology;  Laterality: N/A;   ILEOCECETOMY  02/19/2021   Dr Oren Bracket @ [facility]   ILEOSTOMY  02/19/2021   [facility] - Dr Oren Bracket   IR NEPHROSTOMY PLACEMENT LEFT  10/10/2023    KNEE ARTHROSCOPY  2010   LAPAROSCOPIC ASSISTED VAGINAL HYSTERECTOMY  06/17/2011   Procedure: LAPAROSCOPIC ASSISTED VAGINAL HYSTERECTOMY;  Surgeon: Melony Overly;  Location: [facility];  Service: Gynecology;  Laterality: N/A;  Attempted    MUCUS FISTULA - RECTOSIGMOID  02/19/2021   POLYPECTOMY  08/29/2019   Procedure: POLYPECTOMY;  Surgeon: Shellia Cleverly, DO;  Location: [facility];  Service: Gastroenterology;;   SALPINGOOPHORECTOMY  06/17/2011   Procedure: SALPINGO OOPHERECTOMY;  Surgeon: Melony Overly;  Location: [facility];  Service: Gynecology;  Laterality: Bilateral;   TONSILLECTOMY     Social History:  reports that she quit smoking about 10 months ago. Her smoking use included cigarettes. She has never used smokeless tobacco. She reports that she does not currently use alcohol. She reports that she does not currently use drugs after having used the following drugs: Cocaine and Marijuana.  Allergies  Allergen Reactions   Nsaids Other (See Comments)    Crohn's disease & CKD   Ace Inhibitors Cough   Iodine Other (See Comments)    The patient said she had a reaction from iodine in some eye drops- caused eyes to turn red;  pt has no reactions to CT contrast    Family History  Problem Relation Age of Onset   Diabetes Mother    Dementia Mother    Clotting disorder Father    Macular degeneration Father    Crohn's disease Sister    Colon cancer Neg Hx    Esophageal cancer Neg Hx    Pancreatic cancer Neg Hx    Stomach cancer Neg Hx    Liver disease Neg Hx      Prior to Admission medications   Medication Sig Start Date End Date Taking? Authorizing Provider  Octreotide Acetate 50 MCG/ML SOSY Inject 1 ml ( ) into the skin every 8 hours. 07/30/22  Yes Ronnald Nian, MD  alum & mag hydroxide-simeth (MAALOX/MYLANTA) 200-200-20 MG/5ML suspension Take 30 mLs by mouth every 6 (six) hours as needed for indigestion or heartburn. Patient not taking: Reported on 10/20/2023 10/15/23    Pokhrel, Rebekah Chesterfield, MD  diphenhydramine-acetaminophen (TYLENOL PM) 25-500 MG TABS tablet Take 1 tablet by mouth at bedtime as needed. Patient not taking: Reported on 10/20/2023    [provider]  loperamide (IMODIUM) 2 MG capsule Take 1 capsule (2 mg total) by mouth 3 (three) times daily as needed for diarrhea or loose stools. Patient not taking: Reported on 10/20/2023 10/15/23   Pokhrel, Rebekah Chesterfield, MD  senna-docusate (SENOKOT-S) 8.6-50 MG tablet Take 1 tablet by mouth 2 (two) times daily. Patient not taking: Reported on 10/20/2023 10/15/23   Pokhrel, Rebekah Chesterfield, MD  STELARA 90 MG/ML SOSY injection Inject 90 mg into the skin every 3 (three) months. Patient not taking: Reported on 10/16/2023 01/22/23   [provider]  SYRINGE-NEEDLE, DISP, 3 ML 25G X 5/8" 3 ML MISC Use as directed for octreotide injection Patient not taking: Reported on 10/20/2023 07/03/22   Albertine Grates, MD    Physical Exam: BP 105/73   Pulse 60   Temp (!) 97.4 F (36.3 C) (Oral)   Resp (!) 25  LMP 06/10/2011 (Exact Date)   SpO2 100%  General:  Alert, oriented, calm, in no acute distress, looks quite comfortable, mucous membranes are dry Cardiovascular: RRR, no murmurs or rubs, no peripheral edema  Respiratory: clear to auscultation bilaterally, no wheezes, no crackles  Abdomen: soft, nontender, nondistended, normal bowel tones heard, ileostomy bag in place, with yellow liquid/watery appearing stool.  Abdomen is soft, nontender.  There is some surrounding erythema at the ostomy site. Skin: dry, no rashes  Musculoskeletal: no joint effusions, normal range of motion  Psychiatric: appropriate affect, normal speech  Neurologic: extraocular muscles intact, clear speech, moving all extremities with intact sensorium         Labs on Admission:  Basic Metabolic Panel: Recent Labs  Lab 11/02/23 0113  NA 129*  K 3.5  CL 100  CO2 17*  GLUCOSE 97  BUN 63*  CREATININE 2.78*  CALCIUM 8.5*   Liver Function  Tests: Recent Labs  Lab 11/02/23 0113  AST 13*  ALT 10  ALKPHOS 74  BILITOT 0.7  PROT 7.4  ALBUMIN 3.5   No results for input(s): "LIPASE", "AMYLASE" in the last 168 hours. No results for input(s): "AMMONIA" in the last 168 hours. CBC: Recent Labs  Lab 11/02/23 0113  WBC 11.7*  NEUTROABS 8.2*  HGB 12.8  HCT 38.5  MCV 89.3  PLT 306   Cardiac Enzymes: No results for input(s): "CKTOTAL", "CKMB", "CKMBINDEX", "TROPONINI" in the last 168 hours. BNP (last 3 results) No results for input(s): "BNP" in the last 8760 hours.  ProBNP (last 3 results) No results for input(s): "PROBNP" in the last 8760 hours.  CBG: No results for input(s): "GLUCAP" in the last 168 hours.  Radiological Exams on Admission: CT ABDOMEN PELVIS WO CONTRAST Result Date: 11/02/2023 CLINICAL DATA:  Abdominal pain.  History of Crohn disease. EXAM: CT ABDOMEN AND PELVIS WITHOUT CONTRAST TECHNIQUE: Multidetector CT imaging of the abdomen and pelvis was performed following the standard protocol without IV contrast. RADIATION DOSE REDUCTION: This exam was performed according to the departmental dose-optimization program which includes automated exposure control, adjustment of the mA and/or kV according to patient size and/or use of iterative reconstruction technique. COMPARISON:  10/09/2023 FINDINGS: Lower chest: Dependent atelectasis noted right lower lobe. Hepatobiliary: No suspicious focal abnormality in the liver on this study without intravenous contrast. Small gallstone noted towards the fundus. No intrahepatic or extrahepatic biliary dilation. Pancreas: No focal mass lesion. No dilatation of the main duct. No intraparenchymal cyst. No peripancreatic edema. Spleen: No splenomegaly. No suspicious focal mass lesion. Adrenals/Urinary Tract: No adrenal nodule or mass. Right kidney unremarkable. Left percutaneous nephrostomy tube again noted with large stone in the central left renal pelvis. No evidence for hydroureter.  The urinary bladder appears normal for the degree of distention. Stomach/Bowel: Stomach is unremarkable. No gastric wall thickening. No evidence of outlet obstruction. Duodenum is normally positioned as is the ligament of Treitz. No small bowel wall thickening. No small bowel dilatation. Stable postop changes from subtotal colectomy with right sided ileostomy in mucous fistula noted left side. No substantial mesenteric edema or congestion. Vascular/Lymphatic: There is mild atherosclerotic calcification of the abdominal aorta without aneurysm. There is no gastrohepatic or hepatoduodenal ligament lymphadenopathy. No retroperitoneal or mesenteric lymphadenopathy. No pelvic sidewall lymphadenopathy. Reproductive: Hysterectomy.  There is no adnexal mass. Other: No intraperitoneal free fluid. Musculoskeletal: No worrisome lytic or sclerotic osseous abnormality. IMPRESSION: 1. No acute findings in the abdomen or pelvis. Specifically, no findings to explain the patient's history of abdominal pain.  2. Stable postop changes involving small bowel and colon. No small bowel wall thickening. No mesenteric edema or congestion. No bowel obstruction. 3. Left percutaneous nephrostomy tube again noted with large stone in the central left renal pelvis. No evidence for hydroureter. 4. Cholelithiasis. 5.  Aortic Atherosclerosis (ICD10-I70.0). Electronically Signed   By: Kennith Center M.D.   On: 11/02/2023 05:51   DG Abdomen Acute W/Chest Result Date: 11/02/2023 CLINICAL DATA:  High ostomy output.  Sepsis.  Extreme pain. EXAM: DG ABDOMEN ACUTE WITH 1 VIEW CHEST COMPARISON:  CT 10/09/2023 FINDINGS: The lungs are clear. No focal airspace disease. The heart is normal in size. No pleural fluid or pneumothorax. No free intra-abdominal air. Left-sided nephrostomy tube with coil projecting over the region of the left renal pelvis. The previous left renal pelvis stones are not definitively seen. Generalized paucity of bowel gas. No gaseous  bowel distension or evidence of obstruction. No free intra-abdominal air. Rounded density in the right upper abdomen corresponds to ostomy on prior CT. IMPRESSION: 1. Generalized paucity of bowel gas. No gaseous bowel distension or evidence of obstruction. 2. Left-sided nephrostomy tube with coiled projecting over the region of the left renal pelvis. The previous left renal pelvis stones are not definitively seen. 3. No acute chest findings. Electronically Signed   By: Narda Rutherford M.D.   On: 11/02/2023 02:06   Assessment/Plan Cynthia [name] is a 62 y.o. female with medical history significant for Crohn's disease status post ileocolectomy, sigmoid colectomy with ileostomy, anxiety, depression, hypertension, CKD stage IIIb with recent hospital stay earlier this month and nephrostomy tube placement due to left UPJ obstructing stone being admitted to the hospital with recurrent chronic diarrhea, and acute renal failure superimposed on CKD stage IIIb.  Acute renal failure superimposed on CKD stage IIIb-likely due to relative dehydration, from continued copious diarrhea.  Baseline creatinine about 1.5.  This is a chronic problem for her, she has taken Imodium in the past but has not been taking it recently since her diarrhea got worse. -Inpatient admission -Avoid nephrotoxins, renally dose medications -CT with known left UPJ stone, nephrostomy tube in place; no evidence of obstruction -Hydrate with normal saline for 1 more liter, encourage p.o. intake -Follow renal function with daily labs  Hypokalemia-potassium level is borderline low, but anticipate hypokalemia as her diarrhea continues. -40 mill equivalents p.o. potassium now -Check magnesium level (had hypomagnesemia on recent admission) -Follow potassium level with morning labs  Leukocytosis-I doubt acute infection, given lack of fever, dysuria, or other localizing symptoms.  Leukocytosis is actually improved compared to prior  admission. -Follow-up urine culture -Hold off on further antibiotics  Crohn's ileitis status post ileocolectomy/ileostomy-continue home octreotide, loperamide as needed for diarrhea  Benign essential hypertension-well-controlled, not on any medications, currently on the low side likely due to dehydration.  Anxiety and depression-not currently on medication  Housing issues-TOC consult, patient currently living in a hotel  DVT prophylaxis: Subcutaneous heparin due to AKI    Code Status: Full Code  Consults called: None  Admission status: The appropriate patient status for this patient is INPATIENT. Inpatient status is judged to be reasonable and necessary in order to provide the required intensity of service to ensure the patient's safety. The patient's presenting symptoms, physical exam findings, and initial radiographic and laboratory data in the context of their chronic comorbidities is felt to place them at high risk for further clinical deterioration. Furthermore, it is not anticipated that the patient will be medically stable for discharge from the  hospital within 2 midnights of admission.    I certify that at the point of admission it is my clinical judgment that the patient will require inpatient hospital care spanning beyond 2 midnights from the point of admission due to high intensity of service, high risk for further deterioration and high frequency of surveillance required   Time spent: 59 minutes  Suheyb Raucci Sharlette Dense MD [facility] Pager [phone]  If 7PM-7AM, please contact night-coverage [email or web address] Password [email or web address]  11/02/2023, 11:18 AM

## 2023-11-02 NOTE — TOC CM/SW Note (Signed)
Transition of Care [facility]) - Emergency Department Mini Assessment   Patient Details  Name: Cynthia [name] MRN: [id number] Date of Birth: [birth year]  Transition of Care (TOC) CM/SW Contact:    Armanda Heritage, RN Phone Number: 11/02/2023, 10:19 AM   Clinical Narrative: Noted TOC consult for HH/DME, no DME needs identified, patient is un-housed and not eligible to receive [facility] services.  Patient received shelter resources on previous stay and has access to those.  No further TOC needs identified at this time.  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   Acute cystitis with hematuria 10/10/2023   CKD (chronic kidney disease) stage 3, GFR 30-59 ml/min (HCC) 04/13/2022   Anxiety 04/13/2022   Irritant contact dermatitis associated with fecal stoma 04/13/2022   Hypoglycemia 12/25/2021   Vitamin B deficiency, unspecified 10/24/2021   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   High anion gap metabolic acidosis 07/10/2021   Nausea and vomiting 06/26/2021   Adjustment disorder 03/14/2021   Unspecified severe protein-calorie malnutrition (HCC) 03/13/2021   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   History of rectal polyp 2020 01/12/2021   Anxiety associated with depression 01/12/2021   High risk medication use 11/27/2020   Crohn's ileitis & colitis s/p ileocolectomy/ileostomy and sigmoid  colectomy/mucus fistula 10/05/2019   Rectal polyp    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 [place] [address] Phone: [phone] Fax: [phone]  [facility] [address] [place] [address] Phone: [phone] Fax: [phone]  [facility] [place], [place] - [address] [place] [address] Phone: [phone] Fax: [phone]

## 2023-11-02 NOTE — ED Provider Notes (Signed)
[place] EMERGENCY DEPARTMENT AT [facility] Provider Note   CSN: [id number] Arrival date & time: 11/02/23  0034     History  Chief Complaint  Patient presents with   GI Problem    Patient brought in by [facility] for colostomy for 6hrs output being watery  and can't keep bag on ostomy. Ostomy area red and inflamed with feces present . Patient has a nephrostomy tube as well. HR 124 with medics cold to touch end tidal 20 BS 178 BP 94/70. Received 150NS and 200LR. Patient covered in feces from waist  down including hands.     Cynthia [name] is a 62 y.o. female.  HPI     This is a 62 year old female with a history of Crohn's disease status post colostomy, nephrostomy tube placement who presents with concern for increased ostomy output and hypotension at home.  Patient states that she began to feel ill earlier today.  Since that time she has had increased ostomy output.  This in turn has caused increased irritation around the skin of the stoma.  She has not noted any fevers.  Denies overt chest pain or abdominal pain.  No nausea or vomiting.  Home Medications Prior to Admission medications   Medication Sig Start Date End Date Taking? Authorizing Provider  Octreotide Acetate 50 MCG/ML SOSY Inject 1 ml ( ) into the skin every 8 hours. 07/30/22  Yes Ronnald Nian, MD  alum & mag hydroxide-simeth (MAALOX/MYLANTA) 200-200-20 MG/5ML suspension Take 30 mLs by mouth every 6 (six) hours as needed for indigestion or heartburn. Patient not taking: Reported on 10/20/2023 10/15/23   Pokhrel, Rebekah Chesterfield, MD  diphenhydramine-acetaminophen (TYLENOL PM) 25-500 MG TABS tablet Take 1 tablet by mouth at bedtime as needed. Patient not taking: Reported on 10/20/2023    [provider]  loperamide (IMODIUM) 2 MG capsule Take 1 capsule (2 mg total) by mouth 3 (three) times daily as needed for diarrhea or loose stools. Patient not taking: Reported on 10/20/2023 10/15/23   Pokhrel,  Rebekah Chesterfield, MD  senna-docusate (SENOKOT-S) 8.6-50 MG tablet Take 1 tablet by mouth 2 (two) times daily. Patient not taking: Reported on 10/20/2023 10/15/23   Pokhrel, Rebekah Chesterfield, MD  STELARA 90 MG/ML SOSY injection Inject 90 mg into the skin every 3 (three) months. Patient not taking: Reported on 10/16/2023 01/22/23   [provider]  SYRINGE-NEEDLE, DISP, 3 ML 25G X 5/8" 3 ML MISC Use as directed for octreotide injection Patient not taking: Reported on 10/20/2023 07/03/22   Albertine Grates, MD      Allergies    Nsaids, Ace inhibitors, and Iodine    Review of Systems   Review of Systems  Constitutional:  Negative for fever.  Respiratory:  Negative for cough and shortness of breath.   Cardiovascular:  Negative for chest pain.  Gastrointestinal:  Positive for diarrhea. Negative for abdominal pain, nausea and vomiting.  All other systems reviewed and are negative.   Physical Exam Updated Vital Signs BP 104/80   Pulse (!) 113   Temp (!) 97.4 F (36.3 C) (Oral)   Resp 18   LMP 06/10/2011 (Exact Date)   SpO2 100%  Physical Exam Vitals and nursing note reviewed.  Constitutional:      Appearance: She is well-developed.     Comments: Thin, ill-appearing but nontoxic  HENT:     Head: Normocephalic and atraumatic.     Mouth/Throat:     Mouth: Mucous membranes are moist.  Eyes:     Pupils:  Pupils are equal, round, and reactive to light.  Cardiovascular:     Rate and Rhythm: Regular rhythm. Tachycardia present.     Heart sounds: Normal heart sounds.  Pulmonary:     Effort: Pulmonary effort is normal. No respiratory distress.     Breath sounds: No wheezing.  Abdominal:     General: Bowel sounds are normal.     Palpations: Abdomen is soft.     Comments: Pink stoma right mid abdomen, erythema adjacent with some skin breakdown  Genitourinary:    Comments: Left nephrostomy tube without significant skin irritation or obvious abnormality Musculoskeletal:     Cervical back: Neck supple.   Skin:    General: Skin is warm and dry.  Neurological:     Mental Status: She is alert and oriented to person, place, and time.  Psychiatric:        Mood and Affect: Mood normal.     ED Results / Procedures / Treatments   Labs (all labs ordered are listed, but only abnormal results are displayed) Labs Reviewed  COMPREHENSIVE METABOLIC PANEL - Abnormal; Notable for the following components:      Result Value   Sodium 129 (*)    CO2 17 (*)    BUN 63 (*)    Creatinine, Ser 2.78 (*)    Calcium 8.5 (*)    AST 13 (*)    GFR, Estimated 18 (*)    All other components within normal limits  CBC WITH DIFFERENTIAL/PLATELET - Abnormal; Notable for the following components:   WBC 11.7 (*)    Neutro Abs 8.2 (*)    All other components within normal limits  PROTIME-INR - Abnormal; Notable for the following components:   Prothrombin Time 17.4 (*)    INR 1.4 (*)    All other components within normal limits  RESP PANEL BY RT-PCR (RSV, FLU A&B, COVID)  RVPGX2  CULTURE, BLOOD (ROUTINE X 2)  CULTURE, BLOOD (ROUTINE X 2)  APTT  URINALYSIS, W/ REFLEX TO CULTURE (INFECTION SUSPECTED)  I-STAT CG4 LACTIC ACID, ED  I-STAT CG4 LACTIC ACID, ED    EKG EKG Interpretation Date/Time:  Monday November 02 2023 01:42:25 EST Ventricular Rate:  112 PR Interval:  139 QRS Duration:  92 QT Interval:  334 QTC Calculation: 456 R Axis:   -76  Text Interpretation: Sinus tachycardia Biatrial enlargement Abnormal R-wave progression, late transition Probable left ventricular hypertrophy Inferior infarct, old Confirmed by Ross Marcus ([id number]) on 11/02/2023 2:32:48 AM  Radiology CT ABDOMEN PELVIS WO CONTRAST Result Date: 11/02/2023 CLINICAL DATA:  Abdominal pain.  History of Crohn disease. EXAM: CT ABDOMEN AND PELVIS WITHOUT CONTRAST TECHNIQUE: Multidetector CT imaging of the abdomen and pelvis was performed following the standard protocol without IV contrast. RADIATION DOSE REDUCTION: This exam was  performed according to the departmental dose-optimization program which includes automated exposure control, adjustment of the mA and/or kV according to patient size and/or use of iterative reconstruction technique. COMPARISON:  10/09/2023 FINDINGS: Lower chest: Dependent atelectasis noted right lower lobe. Hepatobiliary: No suspicious focal abnormality in the liver on this study without intravenous contrast. Small gallstone noted towards the fundus. No intrahepatic or extrahepatic biliary dilation. Pancreas: No focal mass lesion. No dilatation of the main duct. No intraparenchymal cyst. No peripancreatic edema. Spleen: No splenomegaly. No suspicious focal mass lesion. Adrenals/Urinary Tract: No adrenal nodule or mass. Right kidney unremarkable. Left percutaneous nephrostomy tube again noted with large stone in the central left renal pelvis. No evidence for hydroureter. The urinary bladder  appears normal for the degree of distention. Stomach/Bowel: Stomach is unremarkable. No gastric wall thickening. No evidence of outlet obstruction. Duodenum is normally positioned as is the ligament of Treitz. No small bowel wall thickening. No small bowel dilatation. Stable postop changes from subtotal colectomy with right sided ileostomy in mucous fistula noted left side. No substantial mesenteric edema or congestion. Vascular/Lymphatic: There is mild atherosclerotic calcification of the abdominal aorta without aneurysm. There is no gastrohepatic or hepatoduodenal ligament lymphadenopathy. No retroperitoneal or mesenteric lymphadenopathy. No pelvic sidewall lymphadenopathy. Reproductive: Hysterectomy.  There is no adnexal mass. Other: No intraperitoneal free fluid. Musculoskeletal: No worrisome lytic or sclerotic osseous abnormality. IMPRESSION: 1. No acute findings in the abdomen or pelvis. Specifically, no findings to explain the patient's history of abdominal pain. 2. Stable postop changes involving small bowel and colon. No  small bowel wall thickening. No mesenteric edema or congestion. No bowel obstruction. 3. Left percutaneous nephrostomy tube again noted with large stone in the central left renal pelvis. No evidence for hydroureter. 4. Cholelithiasis. 5.  Aortic Atherosclerosis (ICD10-I70.0). Electronically Signed   By: Kennith Center M.D.   On: 11/02/2023 05:51   DG Abdomen Acute W/Chest Result Date: 11/02/2023 CLINICAL DATA:  High ostomy output.  Sepsis.  Extreme pain. EXAM: DG ABDOMEN ACUTE WITH 1 VIEW CHEST COMPARISON:  CT 10/09/2023 FINDINGS: The lungs are clear. No focal airspace disease. The heart is normal in size. No pleural fluid or pneumothorax. No free intra-abdominal air. Left-sided nephrostomy tube with coil projecting over the region of the left renal pelvis. The previous left renal pelvis stones are not definitively seen. Generalized paucity of bowel gas. No gaseous bowel distension or evidence of obstruction. No free intra-abdominal air. Rounded density in the right upper abdomen corresponds to ostomy on prior CT. IMPRESSION: 1. Generalized paucity of bowel gas. No gaseous bowel distension or evidence of obstruction. 2. Left-sided nephrostomy tube with coiled projecting over the region of the left renal pelvis. The previous left renal pelvis stones are not definitively seen. 3. No acute chest findings. Electronically Signed   By: Narda Rutherford M.D.   On: 11/02/2023 02:06    Procedures Procedures    Medications Ordered in ED Medications  sodium chloride 0.9 % bolus 1,000 mL (0 mLs Intravenous Stopped 11/02/23 0245)  oxyCODONE-acetaminophen (PERCOCET/ROXICET) 5-325 MG per tablet 1 tablet (1 tablet Oral Given 11/02/23 0435)    ED Course/ Medical Decision Making/ A&P Clinical Course as of 11/02/23 8295  Mon Nov 02, 2023  0626 Spoke to the patient's son-in-law, Jamison Neighbor.  He works for [employer].  He is requesting social work evaluation as the patient has significant housing instability  and essentially does not have a place to go.  She also has complicated medical problems.  Consult placed.  Discussed with him that she will likely be discharged with resources. [CH]    Clinical Course User Index [CH] Farha Dano, Mayer Masker, MD                                 Medical Decision Making Amount and/or Complexity of Data Reviewed Labs: ordered. Radiology: ordered.  Risk Prescription drug management.   This patient presents to the ED for concern of dehydration, high output from ileostomy, this involves an extensive number of treatment options, and is a complaint that carries with it a high risk of complications and morbidity.  I considered the following differential and admission for  this acute, potentially life threatening condition.  The differential diagnosis includes acute viral illness, colitis, sepsis, UTI  MDM:    This is a 62 year old female who presents with concern for high ileostomy output and dehydration.  No fevers.  She is most distressed regarding the skin breakdown around her stoma.  She has currently not wearing an ostomy bag.  She is tachycardic.  However she is afebrile.  She has the ileostomy and nephrostomy tube in place.  There is significant skin breakdown but no evidence of cellulitis.  Labs obtained.  Labs notable for leukocytosis to 11.7.  She does have a creatinine of 2.78.  It had most recently trended down to 1.8 but in early December was similar.  She was given a liter of fluids.  Could be related to dehydration.  CT scan does not show any evidence of significant colitis.  Nephrostomy tube appears in place.  On recheck, patient appears much more comfortable.  Her son in law does express concerns regarding her living conditions and housing instability as well as ability to care for herself.  He is requesting social work evaluation.  Also need to have a urinalysis.  After 1 L of fluids, patient orally hydrating.  Do not feel she necessarily requires admission as  dehydration is likely a component of her AKI.  (Labs, imaging, consults)  Labs: I Ordered, and personally interpreted labs.  The pertinent results include: CBC, CMP, lipase, urinalysis, COVID, influenza  Imaging Studies ordered: I ordered imaging studies including CT abdomen, acute abdominal series I independently visualized and interpreted imaging. I agree with the radiologist interpretation  Additional history obtained from chart review.  External records from outside source obtained and reviewed including prior evaluations  Cardiac Monitoring: The patient was maintained on a cardiac monitor.  If on the cardiac monitor, I personally viewed and interpreted the cardiac monitored which showed an underlying rhythm of: Sinus  Reevaluation: After the interventions noted above, I reevaluated the patient and found that they have :improved  Social Determinants of Health:  housing instability  Disposition: Pending  Co morbidities that complicate the patient evaluation  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   GERD (gastroesophageal reflux disease)    Headache(784.0)    MIGRAINE   Heart murmur    Hiatal hernia    History of noncompliance with medical treatment 04/13/2022   History of rectal polyp 2020 01/12/2021   History of serrated colonic polyp 01/13/2011   Hypertension    Incontinence    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HCC)    MVP (mitral valve prolapse)    RLS (restless legs syndrome) 09/25/2014   Sepsis due to cellulitis (HCC) 04/05/2021     Medicines Meds ordered this encounter  Medications   sodium chloride  0.9 % bolus 1,000 mL   oxyCODONE-acetaminophen (PERCOCET/ROXICET) 5-325 MG per tablet 1 tablet    Refill:  0    I have reviewed the patients home medicines and have made adjustments as needed  Problem List / ED Course: Problem List Items Addressed This Visit       Digestive   High output ileostomy (HCC)  Other Visit Diagnoses       Dehydration    -  Primary     AKI (acute kidney injury) (HCC)                       Final Clinical Impression(s) / ED Diagnoses Final diagnoses:  Dehydration  AKI (acute kidney injury) (HCC)  High output ileostomy (HCC)    Rx / DC Orders ED Discharge Orders     None         Shon Baton, MD 11/02/23 [phone]

## 2023-11-02 NOTE — ED Notes (Signed)
Changed ostomy bag

## 2023-11-02 NOTE — Consult Note (Addendum)
WOC Nurse ostomy consult note Stoma type/location: Patient is in the ED and current pouching system is leaking. Irritant contact dermatitis is noted in the peristomal area by Nursing staff.  Pt is familiar to [facility] team from multiple previous visits, most recently on 12/9.  WOC nurse was at [employer] this morning but now has left and is covering another campus.  Consult performed remotely after review of progress notes.  Orders provided for bedside nurses to perform and WOC team will assess the patient tomorrow, if she is admitted.   She had ileostomy surgery in 2022 and has been able to change her pouch independently at home without assistance. On occasion, her output becomes high output and she is not able to recall/does not perform skin care regimen as instructed.    The mucous fistula on the left side of the abdomen is flush with the skin and does not require pouching. This may be covered with a dry gauze 4x4 and secured with tape, if desired.  Peristomal assessment: Reported to be erythematous and broken   Treatment for irritant contact dermatitis due to fecal stoma: Remove pouching system, discard. Wash skin with warm tap water, pat dry. Sprinkle ostomy powder onto skin, brush away excess.  BLOT with liquid barrier film (Cavillon skin-prep-type sealant).  Repeat powder and blotting of skin prep x2. Pouch.   Output: large amt thin, light brown effluent Ostomy pouching: 1pc./2pc.  Education provided: None Enrolled patient in [employer] DC program: Yes, previously. Patient has also been established with [facility], the only [place] Medicaid provider.  In the past, the patient has found herself out of supplies, attempts to reuse supplies and has presented at local DME provider locations in an attempt to obtain supplies. Numerous teaching attempts have been made with this patient regarding her supplies and pouching.   Order Supplies: 5 skin barrier rings, [name] # [id number]; 5 convex  skin barriers [employer] [place] #[id number], 5 high output pouch [employer] [place] [phone]; 2 ostomy powders, [employer] [place] # 6; 2 ostomy  belts, [place] # [id number],  [address] MSN, RN, [place], [place], [place] [phone]

## 2023-11-02 NOTE — ED Notes (Signed)
Patient has 2 Ivs in place placed by medics. Neither access site draws back.

## 2023-11-03 DIAGNOSIS — N390 Urinary tract infection, site not specified: Secondary | ICD-10-CM | POA: Insufficient documentation

## 2023-11-03 DIAGNOSIS — N179 Acute kidney failure, unspecified: Secondary | ICD-10-CM | POA: Diagnosis not present

## 2023-11-03 LAB — CBC
HCT: 33.6 % — ABNORMAL LOW (ref 36.0–46.0)
Hemoglobin: 10.5 g/dL — ABNORMAL LOW (ref 12.0–15.0)
MCH: 30.7 pg (ref 26.0–34.0)
MCHC: 31.3 g/dL (ref 30.0–36.0)
MCV: 98.2 fL (ref 80.0–100.0)
Platelets: 218 10*3/uL (ref 150–400)
RBC: 3.42 MIL/uL — ABNORMAL LOW (ref 3.87–5.11)
RDW: 13.2 % (ref 11.5–15.5)
WBC: 8.7 10*3/uL (ref 4.0–10.5)
nRBC: 0 % (ref 0.0–0.2)

## 2023-11-03 LAB — BASIC METABOLIC PANEL
Anion gap: 8 (ref 5–15)
BUN: 53 mg/dL — ABNORMAL HIGH (ref 8–23)
CO2: 11 mmol/L — ABNORMAL LOW (ref 22–32)
Calcium: 8.2 mg/dL — ABNORMAL LOW (ref 8.9–10.3)
Chloride: 110 mmol/L (ref 98–111)
Creatinine, Ser: 1.82 mg/dL — ABNORMAL HIGH (ref 0.44–1.00)
GFR, Estimated: 31 mL/min — ABNORMAL LOW (ref >60)
Glucose, Bld: 114 mg/dL — ABNORMAL HIGH (ref 70–99)
Potassium: 4.2 mmol/L (ref 3.5–5.1)
Sodium: 129 mmol/L — ABNORMAL LOW (ref 135–145)

## 2023-11-03 LAB — C DIFFICILE QUICK SCREEN W PCR REFLEX
C Diff antigen: NEGATIVE
C Diff interpretation: NOT DETECTED
C Diff toxin: NEGATIVE

## 2023-11-03 MED ORDER — TRAMADOL HCL 50 MG PO TABS
50.0000 mg | ORAL_TABLET | Freq: Two times a day (BID) | ORAL | Status: DC | PRN
  Administered 2023-11-03 – 2023-11-06 (×5): 50 mg via ORAL
  Filled 2023-11-03 (×8): qty 1

## 2023-11-03 MED ORDER — SODIUM CHLORIDE 0.9 % IV SOLN
1.0000 g | INTRAVENOUS | Status: DC
  Administered 2023-11-03 – 2023-11-07 (×5): 1 g via INTRAVENOUS
  Filled 2023-11-03 (×5): qty 10

## 2023-11-03 MED ORDER — SODIUM CHLORIDE 0.9 % IV SOLN: INTRAVENOUS | Status: DC

## 2023-11-03 MED ORDER — SODIUM CHLORIDE 0.9 % IV SOLN: INTRAVENOUS | Status: AC

## 2023-11-03 MED ORDER — FENTANYL CITRATE PF 50 MCG/ML IJ SOSY: 12.5000 ug | PREFILLED_SYRINGE | INTRAMUSCULAR | Status: DC | PRN

## 2023-11-03 MED ORDER — TRAMADOL HCL 50 MG PO TABS: 50.0000 mg | ORAL_TABLET | Freq: Four times a day (QID) | ORAL | Status: DC | PRN

## 2023-11-03 NOTE — TOC Initial Note (Signed)
 Transition of Care [facility]) - Initial/Assessment Note   Patient Details  Name: Cynthia [name] MRN: [id number] Date of Birth: 07/19/1961  Transition [facility]) CM/SW Contact:    Duwaine GORMAN Aran, LCSW Phone Number: 11/03/2023, 11:52 AM  Clinical Narrative: TOC consulted for homelessness. CSW spoke with patient and patient reported she currently lives at the [facility] hotel in [place] and plans to return there after discharge. Patient agreeable to food pantry and transportation resources being added to AVS. Patient will not need utility assistance as she does not pay for utilities in the hotel. Food pantry and transportation resources added to AVS.            Expected Discharge Plan:  ([facility]) Barriers to Discharge: Continued Medical Work up  Patient Goals and CMS Choice Patient states their goals for this hospitalization and ongoing recovery are:: to go to a shelter or hotel Choice offered to / list presented to : NA  Expected Discharge Plan and Services In-house Referral: Clinical Social Work Post Acute Care Choice: NA Living arrangements for the past 2 months: Hotel/Motel          DME Arranged: N/A DME Agency: NA  Prior Living Arrangements/Services Living arrangements for the past 2 months: Hotel/Motel Lives with:: Self Patient language and need for interpreter reviewed:: Yes Do you feel safe going back to the place where you live?: Yes      Need for Family Participation in Patient Care: No (Comment) Care giver support system in place?: No (comment) Criminal Activity/Legal Involvement Pertinent to Current Situation/Hospitalization: No - Comment as needed  Activities of Daily Living ADL Screening (condition at time of admission) Independently performs ADLs?: Yes (appropriate for developmental age) Is the patient deaf or have difficulty hearing?: No Does the patient have difficulty seeing, even when wearing glasses/contacts?: No Does the patient have difficulty  concentrating, remembering, or making decisions?: No  Emotional Assessment Attitude/Demeanor/Rapport: Engaged Affect (typically observed): Accepting, Anxious Orientation: : Oriented to Self, Oriented to Place, Oriented to  Time, Oriented to Situation Psych Involvement: No (comment)  Admission diagnosis:  Dehydration [E86.0] AKI (acute kidney injury) (HCC) [N17.9] High output ileostomy (HCC) [R19.8, Z93.2] Patient Active Problem List   Diagnosis Date Noted   AKI (acute kidney injury) (HCC) 11/02/2023   Contamination of blood culture 10/11/2023   Obstruction of left ureteropelvic junction (UPJ) due to stone 10/10/2023   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   Acute cystitis with hematuria 10/10/2023   CKD (chronic kidney disease) stage 3, GFR 30-59 ml/min (HCC) 04/13/2022   Anxiety 04/13/2022   Irritant contact dermatitis associated with fecal stoma 04/13/2022   Hypoglycemia 12/25/2021   Vitamin B deficiency, unspecified 10/24/2021   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   High anion gap metabolic acidosis 07/10/2021   Nausea and vomiting 06/26/2021   Adjustment disorder 03/14/2021   Unspecified severe protein-calorie malnutrition (HCC) 03/13/2021   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   History of rectal polyp 2020 01/12/2021   Anxiety associated with depression 01/12/2021   High risk medication use 11/27/2020   Crohn's ileitis & colitis s/p ileocolectomy/ileostomy and sigmoid colectomy/mucus fistula 10/05/2019   Rectal polyp    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 [place] [address] Phone: [phone] Fax: [phone]  [facility] [address] [place] [address] Phone: [phone] Fax: [phone]  [facility] [place] [place] - [address] [place] [address] Phone: [phone] Fax: [phone]  Social Drivers of Health (SDOH) Social History: SDOH Screenings   Food Insecurity: Food Insecurity Present (11/02/2023)  Housing: High Risk (11/02/2023)  Transportation Needs: Unmet Transportation Needs (11/02/2023)  Utilities: Not At Risk (11/02/2023)  Recent Concern: Utilities - At Risk (11/02/2023)  Depression (PHQ2-9): Low Risk  (09/29/2023)  Financial Resource Strain: Medium Risk (03/20/2021)   Received from [facility]  Social Connections: Unknown (03/05/2022)   Received from [facility]  Stress: Stress Concern Present (03/20/2021)   Received from [facility]  Tobacco Use: Medium Risk (10/22/2023)   Received from [facility]   SDOH Interventions: Food Insecurity Interventions: Other (Comment), Inpatient TOC (Resources added to AVS.) Housing Interventions: Intervention Not Indicated, Inpatient TOC (Patient reported she will return to the [place] hotel at discharge.) Transportation Interventions: Inpatient TOC, Other (Comment) (Resources added to AVS.) Utilities Interventions: Inpatient TOC, Intervention Not Indicated (Patient is currently staying in a hotel and does not need utility assistance.)  Readmission Risk Interventions    08/20/2021    1:07 PM 04/09/2021    9:32 AM  Readmission Risk Prevention Plan  Transportation Screening Complete Complete  Medication Review Oceanographer) Complete Complete  PCP or Specialist appointment within 3-5 days of discharge Complete Complete  HRI or  Home Care Consult Complete Complete  SW Recovery Care/Counseling Consult Complete Complete  Palliative Care Screening Not Applicable Not Applicable  Skilled Nursing Facility Not Applicable Not Applicable

## 2023-11-03 NOTE — Consult Note (Signed)
 WOC Nurse ostomy follow up; patient with RUQ ileostomy placed at [facility] 2022; says she has been buying supplies out of pocket from a medical supply store off [address]  Stoma type/location:  RUQ ileostomy  Stomal assessment/size: 1 1/4 round, slightly prolapsed, pink moist productive of soft brown stool  Peristomal assessment:  moderate amount of erythema circumferential;  no real skin breakdown noted  Treatment options for stomal/peristomal skin: no sting barrier wipe and crusting right around stoma  Output pouch with minimal soft stool at time of this visit; patient states her output has not been liquid  Ostomy pouching: patient prefers 2 1/4 flat skin barrier Soila [phone]), 2 1/4 pouch Soila [phone]) and 2 barrier ring Soila [phone])  Supplies were ordered for patient and are in room at this time.  She does like to wear an ostomy belt and 2 were provided to her.  Education provided:  patient is well versed  and independent in care of her ostomy. She cuts her own skin barrier at 1 1/4 today, snaps pouch on before applying. We remove current pouch (that had been taped) and assess stoma and peristomal skin.  Mild erythema as above.  Patient cleans area good with warm water  moistened washcloth, I crust area slightly with no sting barrier wipe and stoma powder. Patient places 2 barrier ring around stoma then secures new skin barrier and pouch in place.    Enrolled patient in [place] [employer] Start Discharge program: no established ostomy.  I did give patient information on ostomy clinic as well as an Engineer, site. Patient has seen K. Mayo NP who runs ostomy clinic back in 2023 and was given information as well. I encouraged patient to utilize her insurance for supplies rather than paying out of pocket if possible.    We are sending patient home with (8) 2 1/4 flat skin barriers, 2 1/4 pouches and 2 barrier rings. Also 2 stoma powders, 2 ostomy belts and no sting barrier wipe.   Patient is appreciative of assistance.   WOC team will not follow. Re-consult if further needs arise.   Thank you,    Powell Bar MSN, RN-BC, [employer] [phone]

## 2023-11-03 NOTE — Plan of Care (Signed)
?  Problem: Clinical Measurements: ?Goal: Will remain free from infection ?Outcome: Progressing ?  ?

## 2023-11-03 NOTE — Plan of Care (Signed)
   Problem: Education: Goal: Knowledge of General Education information will improve Description: Including pain rating scale, medication(s)/side effects and non-pharmacologic comfort measures Outcome: Progressing   Problem: Activity: Goal: Risk for activity intolerance will decrease Outcome: Progressing

## 2023-11-03 NOTE — Progress Notes (Signed)
 PROGRESS NOTE    Cynthia [name]  FMW:[id number] DOB: [birth year] DOA: 11/02/2023 PCP: Joyce Norleen BROCKS, MD     Brief Narrative:  PHILICIA [name] is a 62 y.o. female with medical history significant for Crohn's disease status post ileocolectomy, sigmoid colectomy with ileostomy, anxiety, depression, hypertension, CKD stage IIIb with recent hospital stay earlier this month and nephrostomy tube placement due to left UPJ obstructing stone being admitted to the hospital with recurrent chronic diarrhea, and acute renal failure superimposed on CKD stage IIIb.  She was doing well since hospital discharge until about 1 week ago when her stool output became quite copious.  Says that her ostomy bag has been filling up in a matter of just a couple of hours.   Patient was admitted for acute kidney injury due to high ostomy output, UTI and was started on IV antibiotics as well as IV fluid.  New events last 24 hours / Subjective: Complains of pain at the site of nephrostomy tube.  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  Susceptibilities pending -Rocephin   High ostomy output in setting of Crohn's ileitis status post ileocolectomy/ileostomy -C. difficile pending  Prediabetes -A1c 5.7  TOC consulted for housing issues.  Patient states that she currently lives in a hotel, recently sold her house and has not been able to get a short-term rental yet.   DVT prophylaxis:   heparin  injection 5,000 Units Start: 11/02/23 1400 SCDs Start: 11/02/23 1117  Code Status: Full code Family Communication: None at bedside Disposition Plan: Back to hotel on discharge Status is: Inpatient Remains inpatient appropriate because: IV antibiotics    Antimicrobials:  Anti-infectives (From admission, onward)    Start     Dose/Rate Route Frequency Ordered Stop   11/03/23 0900  cefTRIAXone  (ROCEPHIN ) 1 g in sodium chloride  0.9 % 100 mL IVPB        1 g 200 mL/hr over 30 Minutes Intravenous Every 24 hours 11/03/23 0811     11/02/23 1030  cefTRIAXone  (ROCEPHIN ) 1 g in sodium chloride  0.9 % 100 mL IVPB        1 g 200 mL/hr over 30 Minutes Intravenous  Once 11/02/23 1018 11/02/23 1128        Objective: Vitals:   11/02/23 2019 11/03/23 0052 11/03/23 0054 11/03/23 0514  BP: 101/63 (!) 88/66 (!) 99/53 94/82  Pulse: 80 82 81 80  Resp: 18 18  16   Temp: 97.8 F (36.6 C) 98.1 F (36.7 C)  98 F (36.7 C)  TempSrc: Oral Oral  Oral  SpO2: 100% 100%  100%    Intake/Output Summary (Last 24 hours) at 11/03/2023 1424 Last data filed at 11/03/2023 1317 Gross per 24 hour  Intake 3301.67 ml  Output 2470 ml  Net 831.67 ml   There were no vitals filed for this visit.  Examination:  General exam: Appears calm, uncomfortable  Respiratory system: Clear to auscultation. Respiratory effort normal. No respiratory distress. No conversational dyspnea.  Cardiovascular  system: S1 & S2 heard, RRR. No murmurs. No pedal edema. Gastrointestinal system: Abdomen is nondistended, soft  Central nervous system: Alert and oriented. No focal neurological deficits. Speech clear.  Extremities: Symmetric in appearance  Skin: No rashes, lesions or ulcers on exposed skin. + Left nephrostomy tube site evaluated, no erythema or sign of infection Psychiatry: Judgement and insight appear normal. Mood & affect appropriate.   Data Reviewed: I have personally reviewed following labs and imaging  studies  CBC: Recent Labs  Lab 11/02/23 0113 11/03/23 0409  WBC 11.7* 8.7  NEUTROABS 8.2*  --   HGB 12.8 10.5*  HCT 38.5 33.6*  MCV 89.3 98.2  PLT 306 218   Basic Metabolic Panel: Recent Labs  Lab 11/02/23 0113 11/03/23 0409  NA 129* 129*  K 3.5 4.2  CL 100 110  CO2 17* 11*  GLUCOSE 97 114*  BUN 63* 53*  CREATININE 2.78* 1.82*  CALCIUM  8.5* 8.2*  MG 2.2  --    GFR: Estimated Creatinine Clearance: 27 mL/min (A) (by C-G formula based on SCr of 1.82 mg/dL (H)). Liver Function Tests: Recent Labs  Lab 11/02/23 0113  AST 13*  ALT 10  ALKPHOS 74  BILITOT 0.7  PROT 7.4  ALBUMIN  3.5   No results for input(s): LIPASE, AMYLASE in the last 168 hours. No results for input(s): AMMONIA in the last 168 hours. Coagulation Profile: Recent Labs  Lab 11/02/23 0244  INR 1.4*   Cardiac Enzymes: No results for input(s): CKTOTAL, CKMB, CKMBINDEX, TROPONINI in the last 168 hours. BNP (last 3 results) No results for input(s): PROBNP in the last 8760 hours. HbA1C: No results for input(s): HGBA1C in the last 72 hours. CBG: No results for input(s): GLUCAP in the last 168 hours. Lipid Profile: No results for input(s): CHOL, HDL, LDLCALC, TRIG, CHOLHDL, LDLDIRECT in the last 72 hours. Thyroid  Function Tests: No results for input(s): TSH, T4TOTAL, FREET4, T3FREE, THYROIDAB in the last 72 hours. Anemia Panel: No results for input(s): VITAMINB12, FOLATE, FERRITIN, TIBC, IRON , RETICCTPCT in the last 72 hours. Sepsis Labs: Recent Labs  Lab 11/02/23 0234  LATICACIDVEN 1.9    Recent Results (from the past 240 hours)  Blood Culture (routine x 2)     Status: None (Preliminary result)   Collection Time: 11/02/23  1:13 AM   Specimen: BLOOD  Result Value Ref Range Status   Specimen Description   Final    BLOOD Performed at [facility], [address], [place], [place] [address]    Special Requests   Final     Blood Culture results may not be optimal due to an inadequate volume of blood received in culture bottles BOTTLES DRAWN AEROBIC AND ANAEROBIC Performed at [facility], [address], [place], [place] [address]    Culture   Final    NO GROWTH 1 DAY Performed at [facility], [address], [place], [place] [address]    Report Status PENDING  Incomplete  Resp panel by RT-PCR (RSV, Flu A&B, Covid) Anterior Nasal Swab     Status: None   Collection Time: 11/02/23  3:05 AM   Specimen: Anterior Nasal Swab  Result Value Ref Range Status   SARS Coronavirus 2 by RT PCR NEGATIVE NEGATIVE Final    Comment: (NOTE) SARS-CoV-2 target nucleic acids are NOT DETECTED.  The SARS-CoV-2 RNA is generally detectable in upper respiratory specimens during the acute phase of infection. The lowest concentration of SARS-CoV-2 viral copies this assay can  detect is 138 copies/mL. A negative result does not preclude SARS-Cov-2 infection and should not be used as the sole basis for treatment or other patient management decisions. A negative result may occur with  improper specimen collection/handling, submission of specimen other than nasopharyngeal swab, presence of viral mutation(s) within the areas targeted by this assay, and inadequate number of viral copies(<138 copies/mL). A negative result must be combined with clinical observations, patient history, and epidemiological information. The expected result is Negative.  Fact Sheet for Patients:  bloggercourse.com  Fact Sheet for Healthcare Providers:  seriousbroker.it  This test is no t yet approved or cleared by the [place]  FDA and  has been authorized for detection and/or diagnosis of SARS-CoV-2 by FDA under an Emergency Use Authorization (EUA). This EUA will remain  in effect (meaning this test can be used) for the duration of the COVID-19 declaration under Section  564(b)(1) of the Act, 21 U.S.C.section 360bbb-3(b)(1), unless the authorization is terminated  or revoked sooner.       Influenza A by PCR NEGATIVE NEGATIVE Final   Influenza B by PCR NEGATIVE NEGATIVE Final    Comment: (NOTE) The Xpert Xpress SARS-CoV-2/FLU/RSV plus assay is intended as an aid in the diagnosis of influenza from Nasopharyngeal swab specimens and should not be used as a sole basis for treatment. Nasal washings and aspirates are unacceptable for Xpert Xpress SARS-CoV-2/FLU/RSV testing.  Fact Sheet for Patients: bloggercourse.com  Fact Sheet for Healthcare Providers: seriousbroker.it  This test is not yet approved or cleared by the [place]  FDA and has been authorized for detection and/or diagnosis of SARS-CoV-2 by FDA under an Emergency Use Authorization (EUA). This EUA will remain in effect (meaning this test can be used) for the duration of the COVID-19 declaration under Section 564(b)(1) of the Act, 21 U.S.C. section 360bbb-3(b)(1), unless the authorization is terminated or revoked.     Resp Syncytial Virus by PCR NEGATIVE NEGATIVE Final    Comment: (NOTE) Fact Sheet for Patients: bloggercourse.com  Fact Sheet for Healthcare Providers: seriousbroker.it  This test is not yet approved or cleared by the [place]  FDA and has been authorized for detection and/or diagnosis of SARS-CoV-2 by FDA under an Emergency Use Authorization (EUA). This EUA will remain in effect (meaning this test can be used) for the duration of the COVID-19 declaration under Section 564(b)(1) of the Act, 21 U.S.C. section 360bbb-3(b)(1), unless the authorization is terminated or revoked.  Performed at [facility], [address] [place], [place] [address]   Urine Culture     Status: Abnormal (Preliminary result)   Collection Time: 11/02/23  7:29 AM    Specimen: Urine, Random  Result Value Ref Range Status   Specimen Description   Final    URINE, RANDOM Performed at [facility], [address], [place], [place] [address]    Special Requests   Final    NONE Reflexed from [id number] Performed at [facility], [address] [place], [place] [address]    Culture (A)  Final    60,000 COLONIES/mL VONNE NEGATIVE RODS SUSCEPTIBILITIES TO FOLLOW Performed at [facility], [address] [place], [place] [address]    Report Status PENDING  Incomplete  Blood Culture (routine x 2)     Status: None (Preliminary result)   Collection Time: 11/02/23 12:14 PM   Specimen: BLOOD RIGHT ARM  Result Value Ref Range Status   Specimen Description   Final    BLOOD RIGHT  ARM Performed at [facility], [address], [place], [place] [address]    Special Requests   Final    BOTTLES DRAWN AEROBIC ONLY Blood Culture results may not be optimal due to an inadequate volume of blood received in culture bottles Performed at [facility], [address] [place], [place] [address]    Culture   Final    NO GROWTH < 24 HOURS Performed at [facility], [address] [place], [place] [address]    Report Status PENDING  Incomplete      Radiology Studies: CT ABDOMEN PELVIS WO CONTRAST Result Date: 11/02/2023 CLINICAL DATA:  Abdominal pain.  History of Crohn disease. EXAM: CT ABDOMEN AND PELVIS WITHOUT CONTRAST TECHNIQUE: Multidetector CT imaging of the abdomen and pelvis was performed following the standard protocol without IV contrast. RADIATION DOSE REDUCTION: This exam was performed according to the departmental dose-optimization program which includes automated exposure control, adjustment of the mA and/or kV according to patient size and/or use of iterative reconstruction technique. COMPARISON:  10/09/2023 FINDINGS: Lower chest: Dependent atelectasis noted right lower lobe.  Hepatobiliary: No suspicious focal abnormality in the liver on this study without intravenous contrast. Small gallstone noted towards the fundus. No intrahepatic or extrahepatic biliary dilation. Pancreas: No focal mass lesion. No dilatation of the main duct. No intraparenchymal cyst. No peripancreatic edema. Spleen: No splenomegaly. No suspicious focal mass lesion. Adrenals/Urinary Tract: No adrenal nodule or mass. Right kidney unremarkable. Left percutaneous nephrostomy tube again noted with large stone in the central left renal pelvis. No evidence for hydroureter. The urinary bladder appears normal for the degree of distention. Stomach/Bowel: Stomach is unremarkable. No gastric wall thickening. No evidence of outlet obstruction. Duodenum is normally positioned as is the ligament of Treitz. No small bowel wall thickening. No small bowel dilatation. Stable postop changes from subtotal colectomy with right sided ileostomy in mucous fistula noted left side. No substantial mesenteric edema or congestion. Vascular/Lymphatic: There is mild atherosclerotic calcification of the abdominal aorta without aneurysm. There is no gastrohepatic or hepatoduodenal ligament lymphadenopathy. No retroperitoneal or mesenteric lymphadenopathy. No pelvic sidewall lymphadenopathy. Reproductive: Hysterectomy.  There is no adnexal mass. Other: No intraperitoneal free fluid. Musculoskeletal: No worrisome lytic or sclerotic osseous abnormality. IMPRESSION: 1. No acute findings in the abdomen or pelvis. Specifically, no findings to explain the patient's history of abdominal pain. 2. Stable postop changes involving small bowel and colon. No small bowel wall thickening. No mesenteric edema or congestion. No bowel obstruction. 3. Left percutaneous nephrostomy tube again noted with large stone in the central left renal pelvis. No evidence for hydroureter. 4. Cholelithiasis. 5.  Aortic Atherosclerosis (ICD10-I70.0). Electronically Signed   By: Camellia Candle M.D.   On: 11/02/2023 05:51   DG Abdomen Acute W/Chest Result Date: 11/02/2023 CLINICAL DATA:  High ostomy output.  Sepsis.  Extreme pain. EXAM: DG ABDOMEN ACUTE WITH 1 VIEW CHEST COMPARISON:  CT 10/09/2023 FINDINGS: The lungs are clear. No focal airspace disease. The heart is normal in size. No pleural fluid or pneumothorax. No free intra-abdominal air. Left-sided nephrostomy tube with coil projecting over the region of the left renal pelvis. The previous left renal pelvis stones are not definitively seen. Generalized paucity of bowel gas. No gaseous bowel distension or evidence of obstruction. No free intra-abdominal air. Rounded density in the right upper abdomen corresponds to ostomy on prior CT. IMPRESSION: 1. Generalized paucity of bowel gas. No gaseous bowel distension or evidence of obstruction. 2. Left-sided nephrostomy  tube with coiled projecting over the region of the left renal pelvis. The previous left renal pelvis stones are not definitively seen. 3. No acute chest findings. Electronically Signed   By: Andrea Gasman M.D.   On: 11/02/2023 02:06      Scheduled Meds:  heparin   5,000 Units Subcutaneous Q8H   octreotide   50 mcg Subcutaneous Q8H   Continuous Infusions:  cefTRIAXone  (ROCEPHIN )  IV 1 g (11/03/23 0851)     LOS: 1 day   Time spent: 35 minutes   Delon Hoe, DO [facility] 11/03/2023, 2:24 PM   Available via Epic secure chat 7am-7pm After these hours, please refer to coverage provider listed on [email or web address]

## 2023-11-04 ENCOUNTER — Encounter (HOSPITAL_COMMUNITY): Payer: Self-pay | Admitting: Internal Medicine

## 2023-11-04 DIAGNOSIS — N179 Acute kidney failure, unspecified: Secondary | ICD-10-CM | POA: Diagnosis not present

## 2023-11-04 LAB — MAGNESIUM: Magnesium: 1.7 mg/dL (ref 1.7–2.4)

## 2023-11-04 LAB — BASIC METABOLIC PANEL
Anion gap: 3 — ABNORMAL LOW (ref 5–15)
BUN: 51 mg/dL — ABNORMAL HIGH (ref 8–23)
CO2: 15 mmol/L — ABNORMAL LOW (ref 22–32)
Calcium: 8 mg/dL — ABNORMAL LOW (ref 8.9–10.3)
Chloride: 115 mmol/L — ABNORMAL HIGH (ref 98–111)
Creatinine, Ser: 2.01 mg/dL — ABNORMAL HIGH (ref 0.44–1.00)
GFR, Estimated: 27 mL/min — ABNORMAL LOW (ref >60)
Glucose, Bld: 86 mg/dL (ref 70–99)
Potassium: 4.7 mmol/L (ref 3.5–5.1)
Sodium: 133 mmol/L — ABNORMAL LOW (ref 135–145)

## 2023-11-04 LAB — URINE CULTURE: Culture: 60000 — AB

## 2023-11-04 MED ORDER — SODIUM CHLORIDE 0.9 % IV SOLN: INTRAVENOUS | Status: DC

## 2023-11-04 MED ORDER — DIPHENHYDRAMINE-ZINC ACETATE 2-0.1 % EX CREA
TOPICAL_CREAM | Freq: Three times a day (TID) | CUTANEOUS | Status: DC | PRN
  Filled 2023-11-04: qty 28

## 2023-11-04 NOTE — Plan of Care (Signed)
   Problem: Clinical Measurements: Goal: Ability to maintain clinical measurements within normal limits will improve Outcome: Progressing Goal: Will remain free from infection Outcome: Progressing

## 2023-11-04 NOTE — Plan of Care (Signed)

## 2023-11-04 NOTE — Plan of Care (Signed)
  Problem: Coping: Goal: Level of anxiety will decrease Outcome: Progressing   Problem: Pain Management: Goal: General experience of comfort will improve Outcome: Progressing   Problem: Elimination: Goal: Will not experience complications related to bowel motility Outcome: Progressing Goal: Will not experience complications related to urinary retention Outcome: Progressing

## 2023-11-04 NOTE — Progress Notes (Signed)
 PROGRESS NOTE    Cynthia [name]  FMW:[id number] DOB: 16-Jan-1961 DOA: 11/02/2023 PCP: Joyce Norleen BROCKS, MD     Brief Narrative:  Cynthia [name] is a 63 y.o. female with medical history significant for Crohn's disease status post ileocolectomy, sigmoid colectomy with ileostomy, anxiety, depression, hypertension, CKD stage IIIb with recent hospital stay earlier this month and nephrostomy tube placement due to left UPJ obstructing stone being admitted to the hospital with recurrent chronic diarrhea, and acute renal failure superimposed on CKD stage IIIb.  She was doing well since hospital discharge until about 1 week ago when her stool output became quite copious.  Says that her ostomy bag has been filling up in a matter of just a couple of hours. Patient was admitted for acute kidney injury due to high ostomy output, UTI and was started on IV antibiotics as well as IV fluid.  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 difficile panel negative GI panel pending  Prediabetes A1c 5.7  Social issues TOC consulted for housing issues.  Patient states that she currently lives in a hotel, recently sold  her house and has not been able to get a short-term rental yet.   DVT prophylaxis:  heparin  injection 5,000 Units Start: 11/02/23 1400 SCDs Start: 11/02/23 1117  Code Status: Full code Family Communication: None at bedside Disposition Plan: Back to hotel on discharge Status is: Inpatient Remains inpatient appropriate because: IV antibiotics    Antimicrobials:  Anti-infectives (From admission, onward)    Start     Dose/Rate Route Frequency Ordered Stop   11/03/23 0900  cefTRIAXone  (ROCEPHIN ) 1 g in sodium chloride  0.9 % 100 mL IVPB        1 g 200 mL/hr over 30 Minutes Intravenous Every 24 hours 11/03/23 0811     11/02/23 1030  cefTRIAXone  (ROCEPHIN ) 1 g in sodium chloride  0.9 % 100 mL IVPB        1 g 200 mL/hr over 30 Minutes Intravenous  Once 11/02/23 1018 11/02/23 1128        Objective: Vitals:   11/03/23 1426 11/03/23 2153 11/04/23 0521 11/04/23 1410  BP: (!) 90/57 104/66 98/60 107/79  Pulse: 69 79 75 79  Resp: 18 18 18    Temp: 98.6 F (37 C) 97.8 F (36.6 C) 97.8 F (36.6 C) 97.8 F (36.6 C)  TempSrc: Oral Oral Oral Oral  SpO2: 100% 100% 100% 100%    Intake/Output Summary (Last 24 hours) at 11/04/2023 1615 Last data filed at 11/04/2023 1400 Gross per 24 hour  Intake 2491.67 ml  Output 2560 ml  Net -68.33 ml   There were no vitals filed for this visit.  Examination:  General: NAD  Cardiovascular: S1, S2  present Respiratory: CTAB Abdomen: Soft, nontender, nondistended, bowel sounds present, noted left nephrostomy tube, ostomy present Musculoskeletal: No bilateral pedal edema noted Skin: Normal Psychiatry: Normal mood    Data Reviewed: I have personally reviewed following labs and imaging studies  CBC: Recent Labs  Lab 11/02/23 0113 11/03/23 0409  WBC 11.7* 8.7  NEUTROABS 8.2*  --   HGB 12.8 10.5*  HCT 38.5 33.6*  MCV 89.3 98.2  PLT 306 218   Basic Metabolic Panel: Recent Labs  Lab 11/02/23 0113 11/03/23 0409 11/04/23 0444  NA 129* 129*  133*  K 3.5 4.2 4.7  CL 100 110 115*  CO2 17* 11* 15*  GLUCOSE 97 114* 86  BUN 63* 53* 51*  CREATININE 2.78* 1.82* 2.01*  CALCIUM  8.5* 8.2* 8.0*  MG 2.2  --  1.7   GFR: CrCl cannot be calculated (Unknown ideal weight.). Liver Function Tests: Recent Labs  Lab 11/02/23 0113  AST 13*  ALT 10  ALKPHOS 74  BILITOT 0.7  PROT 7.4  ALBUMIN  3.5   No results for input(s): LIPASE, AMYLASE in the last 168 hours. No results for input(s): AMMONIA in the last 168 hours. Coagulation Profile: Recent Labs  Lab 11/02/23 0244  INR 1.4*   Cardiac Enzymes: No results for input(s): CKTOTAL, CKMB, CKMBINDEX, TROPONINI in the last 168 hours. BNP (last 3 results) No results for input(s): PROBNP in the last 8760 hours. HbA1C: No results for input(s): HGBA1C in the last 72 hours. CBG: No results for input(s): GLUCAP in the last 168 hours. Lipid Profile: No results for input(s): CHOL, HDL, LDLCALC, TRIG, CHOLHDL, LDLDIRECT in the last 72 hours. Thyroid  Function Tests: No results for input(s): TSH, T4TOTAL, FREET4, T3FREE, THYROIDAB in the last 72 hours. Anemia Panel: No results for input(s): VITAMINB12, FOLATE, FERRITIN, TIBC, IRON , RETICCTPCT in the last 72 hours. Sepsis Labs: Recent Labs  Lab 11/02/23 0234  LATICACIDVEN 1.9    Recent Results (from the past 240 hours)  Blood Culture (routine x 2)     Status: None (Preliminary result)   Collection Time: 11/02/23  1:13 AM   Specimen: BLOOD  Result Value Ref Range Status   Specimen Description   Final    BLOOD Performed at [facility], [address], [place], [place] [address]    Special Requests   Final    Blood Culture results may not be optimal due to an inadequate volume of blood received in culture bottles BOTTLES DRAWN AEROBIC AND ANAEROBIC Performed at [facility], [address] [place], [place] [address]    Culture   Final    NO  GROWTH 2 DAYS Performed at [facility], [address] [place], [place] [address]    Report Status PENDING  Incomplete  Resp panel by RT-PCR (RSV, Flu A&B, Covid) Anterior Nasal Swab     Status: None   Collection Time: 11/02/23  3:05 AM   Specimen: Anterior Nasal Swab  Result Value Ref Range Status   SARS Coronavirus 2 by RT PCR NEGATIVE NEGATIVE Final    Comment: (NOTE) SARS-CoV-2 target nucleic acids are NOT DETECTED.  The SARS-CoV-2 RNA is generally detectable in upper respiratory specimens during the acute phase of infection. The lowest concentration of SARS-CoV-2 viral copies this assay can detect is 138 copies/mL. A negative result does not preclude SARS-Cov-2 infection and should not be used as the sole basis for treatment or other patient management decisions. A negative result may occur with  improper specimen  collection/handling, submission of specimen other than nasopharyngeal swab, presence of viral mutation(s) within the areas targeted by this assay, and inadequate number of viral copies(<138 copies/mL). A negative result must be combined with clinical observations, patient history, and epidemiological information. The expected result is Negative.  Fact Sheet for Patients:  bloggercourse.com  Fact Sheet for Healthcare Providers:  seriousbroker.it  This test is no t yet approved or cleared by the [place]  FDA and  has been authorized for detection and/or diagnosis of SARS-CoV-2 by FDA under an Emergency Use Authorization (EUA). This EUA will remain  in effect (meaning this test can be used) for the duration of the COVID-19 declaration under Section 564(b)(1) of the Act, 21 U.S.C.section 360bbb-3(b)(1), unless the authorization is terminated  or revoked sooner.       Influenza A by PCR NEGATIVE NEGATIVE Final   Influenza B by PCR NEGATIVE NEGATIVE Final    Comment: (NOTE) The Xpert Xpress  SARS-CoV-2/FLU/RSV plus assay is intended as an aid in the diagnosis of influenza from Nasopharyngeal swab specimens and should not be used as a sole basis for treatment. Nasal washings and aspirates are unacceptable for Xpert Xpress SARS-CoV-2/FLU/RSV testing.  Fact Sheet for Patients: bloggercourse.com  Fact Sheet for Healthcare Providers: seriousbroker.it  This test is not yet approved or cleared by the [place]  FDA and has been authorized for detection and/or diagnosis of SARS-CoV-2 by FDA under an Emergency Use Authorization (EUA). This EUA will remain in effect (meaning this test can be used) for the duration of the COVID-19 declaration under Section 564(b)(1) of the Act, 21 U.S.C. section 360bbb-3(b)(1), unless the authorization is terminated or revoked.     Resp Syncytial Virus by PCR NEGATIVE NEGATIVE Final    Comment: (NOTE) Fact Sheet for Patients: bloggercourse.com  Fact Sheet for Healthcare Providers: seriousbroker.it  This test is not yet approved or cleared by the [place]  FDA and has been authorized for detection and/or diagnosis of SARS-CoV-2 by FDA under an Emergency Use Authorization (EUA). This EUA will remain in effect (meaning this test can be used) for the duration of the COVID-19 declaration under Section 564(b)(1) of the Act, 21 U.S.C. section 360bbb-3(b)(1), unless the authorization is terminated or revoked.  Performed at [facility], [address], [place], [place] [address]   Urine Culture     Status: Abnormal   Collection Time: 11/02/23  7:29 AM   Specimen: Urine, Random  Result Value Ref Range Status   Specimen Description   Final    URINE, RANDOM Performed at [facility], [address] [place], [place] [address]    Special Requests   Final    NONE Reflexed from [id number] Performed at [facility], [address], [place], [place] [address]    Culture 60,000 COLONIES/mL PROTEUS PENNERI (A)  Final   Report Status 11/04/2023 FINAL  Final   Organism ID, Bacteria PROTEUS PENNERI (A)  Final      Susceptibility   Proteus penneri - MIC*    AMPICILLIN  >=32 RESISTANT Resistant     CEFEPIME  <=0.12 SENSITIVE Sensitive     CEFTRIAXONE  <=0.25 SENSITIVE Sensitive     CIPROFLOXACIN  <=0.25 SENSITIVE Sensitive     GENTAMICIN  <=1 SENSITIVE Sensitive     IMIPENEM 2 SENSITIVE Sensitive     NITROFURANTOIN  RESISTANT Resistant     TRIMETH /SULFA  <=20 SENSITIVE Sensitive     AMPICILLIN /SULBACTAM 8 SENSITIVE Sensitive     PIP/TAZO <=4 SENSITIVE Sensitive ug/mL    *  60,000 COLONIES/mL PROTEUS PENNERI  Blood Culture (routine x 2)     Status: None (Preliminary result)   Collection Time: 11/02/23 12:14 PM   Specimen: BLOOD RIGHT ARM  Result Value Ref Range Status   Specimen Description   Final    BLOOD RIGHT ARM Performed at [facility], [address] [place], [place] [address]    Special Requests   Final    BOTTLES DRAWN AEROBIC ONLY Blood Culture results may not be optimal due to an inadequate volume of blood received in culture bottles Performed at [facility], [address] [place], [place] [address]    Culture   Final    NO GROWTH 2 DAYS Performed at [facility], [address], [place], [place] [address]    Report Status PENDING  Incomplete  C Difficile Quick Screen w PCR reflex     Status: None   Collection Time: 11/03/23  1:12 PM   Specimen: STOOL  Result Value Ref Range Status   C Diff antigen NEGATIVE NEGATIVE Final   C Diff toxin NEGATIVE NEGATIVE Final   C Diff interpretation No C. difficile detected.  Final    Comment: Performed at [facility], [address], [place], [place] [address]      Radiology Studies: No results found.     Scheduled Meds:  heparin   5,000 Units Subcutaneous Q8H    octreotide   50 mcg Subcutaneous Q8H   Continuous Infusions:  sodium chloride  100 mL/hr at 11/04/23 1143   cefTRIAXone  (ROCEPHIN )  IV Stopped (11/04/23 0845)     LOS: 2 days     Lebron JINNY Cage, MD [facility] 11/04/2023, 4:15 PM   Available via Epic secure chat 7am-7pm After these hours, please refer to coverage provider listed on [email or web address]

## 2023-11-05 DIAGNOSIS — N179 Acute kidney failure, unspecified: Secondary | ICD-10-CM | POA: Diagnosis not present

## 2023-11-05 LAB — BASIC METABOLIC PANEL
Anion gap: 3 — ABNORMAL LOW (ref 5–15)
BUN: 50 mg/dL — ABNORMAL HIGH (ref 8–23)
CO2: 14 mmol/L — ABNORMAL LOW (ref 22–32)
Calcium: 7.9 mg/dL — ABNORMAL LOW (ref 8.9–10.3)
Chloride: 117 mmol/L — ABNORMAL HIGH (ref 98–111)
Creatinine, Ser: 1.97 mg/dL — ABNORMAL HIGH (ref 0.44–1.00)
GFR, Estimated: 28 mL/min — ABNORMAL LOW (ref >60)
Glucose, Bld: 84 mg/dL (ref 70–99)
Potassium: 4.7 mmol/L (ref 3.5–5.1)
Sodium: 134 mmol/L — ABNORMAL LOW (ref 135–145)

## 2023-11-05 LAB — CBC WITH DIFFERENTIAL/PLATELET
Abs Immature Granulocytes: 0.03 10*3/uL (ref 0.00–0.07)
Basophils Absolute: 0 10*3/uL (ref 0.0–0.1)
Basophils Relative: 0 %
Eosinophils Absolute: 0.4 10*3/uL (ref 0.0–0.5)
Eosinophils Relative: 5 %
HCT: 26.4 % — ABNORMAL LOW (ref 36.0–46.0)
Hemoglobin: 8.2 g/dL — ABNORMAL LOW (ref 12.0–15.0)
Immature Granulocytes: 1 %
Lymphocytes Relative: 31 %
Lymphs Abs: 2 10*3/uL (ref 0.7–4.0)
MCH: 29.9 pg (ref 26.0–34.0)
MCHC: 31.1 g/dL (ref 30.0–36.0)
MCV: 96.4 fL (ref 80.0–100.0)
Monocytes Absolute: 0.5 10*3/uL (ref 0.1–1.0)
Monocytes Relative: 8 %
Neutro Abs: 3.6 10*3/uL (ref 1.7–7.7)
Neutrophils Relative %: 55 %
Platelets: 204 10*3/uL (ref 150–400)
RBC: 2.74 MIL/uL — ABNORMAL LOW (ref 3.87–5.11)
RDW: 13.7 % (ref 11.5–15.5)
WBC: 6.5 10*3/uL (ref 4.0–10.5)
nRBC: 0 % (ref 0.0–0.2)

## 2023-11-05 LAB — GASTROINTESTINAL PANEL BY PCR, STOOL (REPLACES STOOL CULTURE)

## 2023-11-05 NOTE — Plan of Care (Signed)

## 2023-11-05 NOTE — Progress Notes (Signed)
 COVID Vaccine received:  []  No []  Yes Date of any COVID positive Test in last 90 days:  PCP - Norleen Jobs MD Cardiologist -   Chest x-ray -  EKG -  11/02/23 Epic Stress Test -  ECHO - 01/25/21 Epic Cardiac Cath -   Bowel Prep - []  No  []   Yes ______  Pacemaker / ICD device []  No []  Yes   Spinal Cord Stimulator:[]  No []  Yes       History of Sleep Apnea? []  No []  Yes   CPAP used?- []  No []  Yes    Does the patient monitor blood sugar?          []  No []  Yes  []  N/A  Patient has: []  NO Hx DM   []  Pre-DM                 []  DM1  []   DM2 Does patient have a [employer] or [employer]om? []  No []  Yes   Fasting Blood Sugar Ranges-  Checks Blood Sugar _____ times a day  GLP1 agonist / usual dose -  GLP1 instructions:  SGLT-2 inhibitors / usual dose -  SGLT-2 instructions:   Blood Thinner / Instructions: Aspirin Instructions:  Comments:   Activity level: Patient is able / unable to climb a flight of stairs without difficulty; []  No CP  []  No SOB, but would have ___   Patient can / can not perform ADLs without assistance.   Anesthesia review:   Patient denies shortness of breath, fever, cough and chest pain at PAT appointment.  Patient verbalized understanding and agreement to the Pre-Surgical Instructions that were given to them at this PAT appointment. Patient was also educated of the need to review these PAT instructions again prior to his/her surgery.I reviewed the appropriate phone numbers to call if they have any and questions or concerns.

## 2023-11-05 NOTE — Progress Notes (Signed)
 PROGRESS NOTE    Cynthia [name]  FMW:[id number] DOB: Dec 25, 1960 DOA: 11/02/2023 PCP: Joyce Norleen BROCKS, MD     Brief Narrative:  Cynthia [name] is a 63 y.o. female with medical history significant for Crohn's disease status post ileocolectomy, sigmoid colectomy with ileostomy, anxiety, depression, hypertension, CKD stage IIIb with recent hospital stay earlier this month and nephrostomy tube placement due to left UPJ obstructing stone being admitted to the hospital with recurrent chronic diarrhea, and acute renal failure superimposed on CKD stage IIIb.  She was doing well since hospital discharge until about 1 week ago when her stool output became quite copious.  Says that her ostomy bag has been filling up in a matter of just a couple of hours. Patient was admitted for acute kidney injury due to high ostomy output, UTI and was started on IV antibiotics as well as IV fluid.  New events last 24 hours / Subjective: Today, patient denies any new complaints.  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 difficile panel negative Monitor  Prediabetes A1c 5.7  Social issues TOC consulted for housing issues.  Patient states that she currently lives in a  hotel, recently sold her house and has not been able to get a short-term rental yet.   DVT prophylaxis:  heparin  injection 5,000 Units Start: 11/02/23 1400 SCDs Start: 11/02/23 1117  Code Status: Full code Family Communication: None at bedside Disposition Plan: Back to hotel on discharge Status is: Inpatient Remains inpatient appropriate because: IV antibiotics    Antimicrobials:  Anti-infectives (From admission, onward)    Start     Dose/Rate Route Frequency Ordered Stop   11/03/23 0900  cefTRIAXone  (ROCEPHIN ) 1 g in sodium chloride  0.9 % 100 mL IVPB        1 g 200 mL/hr over 30 Minutes Intravenous Every 24 hours 11/03/23 0811     11/02/23 1030  cefTRIAXone  (ROCEPHIN ) 1 g in sodium chloride  0.9 % 100 mL IVPB        1 g 200 mL/hr over 30 Minutes Intravenous  Once 11/02/23 1018 11/02/23 1128        Objective: Vitals:   11/04/23 1410 11/04/23 2142 11/05/23 0550 11/05/23 1337  BP: 107/79 108/65 98/66 (!) 96/55  Pulse: 79 77 74 76  Resp:  18 18   Temp: 97.8 F (36.6 C) 98.4 F (36.9 C) 98.2 F (36.8 C) (!) 97.4 F (36.3 C)  TempSrc: Oral Oral Oral Oral  SpO2: 100% 100% 100% 100%  Weight:  57.7 kg    Height:  5' 4 (1.626 m)      Intake/Output Summary (Last 24 hours) at 11/05/2023 1449 Last data filed at 11/05/2023 1400 Gross per 24 hour  Intake 3932.86 ml  Output 1380 ml  Net 2552.86 ml   Filed Weights   11/04/23 2142  Weight: 57.7 kg    Examination:  General: NAD  Cardiovascular: S1, S2 present Respiratory: CTAB Abdomen: Soft, nontender, nondistended, bowel sounds present, noted left nephrostomy tube, ostomy present Musculoskeletal: No bilateral pedal edema noted Skin: Normal Psychiatry: Normal mood    Data Reviewed: I have personally reviewed following labs and imaging studies  CBC: Recent Labs  Lab 11/02/23 0113 11/03/23 0409 11/05/23 0505  WBC 11.7* 8.7 6.5  NEUTROABS 8.2*  --  3.6  HGB 12.8 10.5* 8.2*  HCT 38.5 33.6* 26.4*  MCV 89.3 98.2  96.4  PLT 306 218 204   Basic Metabolic Panel: Recent Labs  Lab 11/02/23 0113 11/03/23 0409 11/04/23 0444 11/05/23 0505  NA 129* 129* 133* 134*  K 3.5 4.2 4.7 4.7  CL 100 110 115* 117*  CO2 17* 11* 15* 14*  GLUCOSE 97 114* 86 84  BUN 63* 53* 51* 50*  CREATININE 2.78* 1.82* 2.01* 1.97*  CALCIUM  8.5* 8.2* 8.0* 7.9*  MG 2.2  --  1.7  --    GFR: Estimated Creatinine Clearance: 24.9 mL/min (A) (by C-G formula based on SCr of 1.97 mg/dL (H)). Liver Function Tests: Recent Labs  Lab 11/02/23 0113  AST 13*  ALT 10  ALKPHOS 74  BILITOT 0.7  PROT 7.4  ALBUMIN  3.5   No results for input(s): LIPASE, AMYLASE in the last 168 hours. No results for input(s): AMMONIA in the last 168 hours. Coagulation Profile: Recent Labs  Lab 11/02/23 0244  INR 1.4*   Cardiac Enzymes: No results for input(s): CKTOTAL, CKMB, CKMBINDEX, TROPONINI in the last 168 hours. BNP (last 3 results) No results for input(s): PROBNP in the last 8760 hours. HbA1C: No results for input(s): HGBA1C in the last 72 hours. CBG: No results for input(s): GLUCAP in the last 168 hours. Lipid Profile: No results for input(s): CHOL, HDL, LDLCALC, TRIG, CHOLHDL, LDLDIRECT in the last 72 hours. Thyroid  Function Tests: No results for input(s): TSH, T4TOTAL, FREET4, T3FREE, THYROIDAB in the last 72 hours. Anemia Panel: No results for input(s): VITAMINB12, FOLATE, FERRITIN, TIBC, IRON , RETICCTPCT in the last 72 hours. Sepsis Labs: Recent Labs  Lab 11/02/23 0234  LATICACIDVEN 1.9    Recent Results (from the past 240 hours)  Blood Culture (routine x 2)     Status: None (Preliminary result)   Collection Time: 11/02/23  1:13 AM   Specimen: BLOOD  Result Value Ref Range Status   Specimen Description   Final    BLOOD Performed at [facility], [address] [place], [place] [address]    Special Requests   Final    Blood Culture results may  not be optimal due to an inadequate volume of blood received in culture bottles BOTTLES DRAWN AEROBIC AND ANAEROBIC Performed at [facility], [address], [place], [place] [address]    Culture   Final    NO GROWTH 3 DAYS Performed at [facility], [address] [place], [place] [address]    Report Status PENDING  Incomplete  Resp panel by RT-PCR (RSV, Flu A&B, Covid) Anterior Nasal Swab     Status: None   Collection Time: 11/02/23  3:05 AM   Specimen: Anterior Nasal Swab  Result Value Ref Range Status   SARS Coronavirus 2 by RT PCR NEGATIVE NEGATIVE Final    Comment: (NOTE) SARS-CoV-2 target nucleic acids are NOT DETECTED.  The SARS-CoV-2 RNA is generally detectable in  upper respiratory specimens during the acute phase of infection. The lowest concentration of SARS-CoV-2 viral copies this assay can detect is 138 copies/mL. A negative result does not preclude SARS-Cov-2 infection and should not be used as the sole basis for treatment or other patient management decisions. A negative result may occur with  improper specimen collection/handling, submission of specimen other than nasopharyngeal swab, presence of viral mutation(s) within the areas targeted by this assay, and inadequate number of viral copies(<138 copies/mL). A negative result must be combined with clinical observations, patient history, and epidemiological information. The expected result is Negative.  Fact Sheet for Patients:  bloggercourse.com  Fact Sheet for Healthcare Providers:  seriousbroker.it  This test is no t yet approved or cleared by the [place]  FDA and  has been authorized for detection and/or diagnosis of SARS-CoV-2 by FDA under an Emergency Use Authorization (EUA). This EUA will remain  in effect (meaning this test can be used) for the duration of the COVID-19 declaration under Section 564(b)(1) of the Act,  21 U.S.C.section 360bbb-3(b)(1), unless the authorization is terminated  or revoked sooner.       Influenza A by PCR NEGATIVE NEGATIVE Final   Influenza B by PCR NEGATIVE NEGATIVE Final    Comment: (NOTE) The Xpert Xpress SARS-CoV-2/FLU/RSV plus assay is intended as an aid in the diagnosis of influenza from Nasopharyngeal swab specimens and should not be used as a sole basis for treatment. Nasal washings and aspirates are unacceptable for Xpert Xpress SARS-CoV-2/FLU/RSV testing.  Fact Sheet for Patients: bloggercourse.com  Fact Sheet for Healthcare Providers: seriousbroker.it  This test is not yet approved or cleared by the [place]  FDA and has been authorized for detection and/or diagnosis of SARS-CoV-2 by FDA under an Emergency Use Authorization (EUA). This EUA will remain in effect (meaning this test can be used) for the duration of the COVID-19 declaration under Section 564(b)(1) of the Act, 21 U.S.C. section 360bbb-3(b)(1), unless the authorization is terminated or revoked.     Resp Syncytial Virus by PCR NEGATIVE NEGATIVE Final    Comment: (NOTE) Fact Sheet for Patients: bloggercourse.com  Fact Sheet for Healthcare Providers: seriousbroker.it  This test is not yet approved or cleared by the [place]  FDA and has been authorized for detection and/or diagnosis of SARS-CoV-2 by FDA under an Emergency Use Authorization (EUA). This EUA will remain in effect (meaning this test can be used) for the duration of the COVID-19 declaration under Section 564(b)(1) of the Act, 21 U.S.C. section 360bbb-3(b)(1), unless the authorization is terminated or revoked.  Performed at [facility], [address], [place], [place] [address]   Urine Culture     Status: Abnormal   Collection Time: 11/02/23  7:29 AM   Specimen: Urine, Random  Result Value Ref  Range Status   Specimen Description   Final    URINE, RANDOM Performed at [facility], [address], [place], [place] [address]    Special Requests   Final    NONE Reflexed from [id number] Performed at [facility], [address], [place], [place] [address]    Culture 60,000 COLONIES/mL PROTEUS PENNERI (A)  Final   Report Status 11/04/2023 FINAL  Final   Organism ID, Bacteria PROTEUS PENNERI (A)  Final      Susceptibility   Proteus penneri - MIC*    AMPICILLIN  >=32 RESISTANT Resistant     CEFEPIME  <=0.12 SENSITIVE Sensitive     CEFTRIAXONE  <=0.25 SENSITIVE Sensitive  CIPROFLOXACIN  <=0.25 SENSITIVE Sensitive     GENTAMICIN  <=1 SENSITIVE Sensitive     IMIPENEM 2 SENSITIVE Sensitive     NITROFURANTOIN  RESISTANT Resistant     TRIMETH /SULFA  <=20 SENSITIVE Sensitive     AMPICILLIN /SULBACTAM 8 SENSITIVE Sensitive     PIP/TAZO <=4 SENSITIVE Sensitive ug/mL    * 60,000 COLONIES/mL PROTEUS PENNERI  Blood Culture (routine x 2)     Status: None (Preliminary result)   Collection Time: 11/02/23 12:14 PM   Specimen: BLOOD RIGHT ARM  Result Value Ref Range Status   Specimen Description   Final    BLOOD RIGHT ARM Performed at [facility], [address] [place], [place] [address]    Special Requests   Final    BOTTLES DRAWN AEROBIC ONLY Blood Culture results may not be optimal due to an inadequate volume of blood received in culture bottles Performed at [facility], [address] [place], [place] [address]    Culture   Final    NO GROWTH 3 DAYS Performed at [facility], [address], [place], [place] [address]    Report Status PENDING  Incomplete  C Difficile Quick Screen w PCR reflex     Status: None   Collection Time: 11/03/23  1:12 PM   Specimen: STOOL  Result Value Ref Range Status   C Diff antigen NEGATIVE NEGATIVE Final   C Diff toxin NEGATIVE NEGATIVE Final   C Diff interpretation No C. difficile  detected.  Final    Comment: Performed at [facility], [address], [place], [place] [address]  Gastrointestinal Panel by PCR , Stool     Status: None   Collection Time: 11/04/23  4:19 PM   Specimen: Stool  Result Value Ref Range Status   Campylobacter species NOT DETECTED NOT DETECTED Final   Plesimonas shigelloides NOT DETECTED NOT DETECTED Final   Salmonella species NOT DETECTED NOT DETECTED Final   Yersinia enterocolitica NOT DETECTED NOT DETECTED Final   Vibrio species NOT DETECTED NOT DETECTED Final   Vibrio cholerae NOT DETECTED NOT DETECTED Final   Enteroaggregative E coli (EAEC) NOT DETECTED NOT DETECTED Final   Enteropathogenic E coli (EPEC) NOT DETECTED NOT DETECTED Final   Enterotoxigenic E coli (ETEC) NOT DETECTED NOT DETECTED Final   Shiga like toxin producing E coli (STEC) NOT DETECTED NOT DETECTED Final   Shigella/Enteroinvasive E coli (EIEC) NOT DETECTED NOT DETECTED Final   Cryptosporidium NOT DETECTED NOT DETECTED Final   Cyclospora cayetanensis NOT DETECTED NOT DETECTED Final   Entamoeba histolytica NOT DETECTED NOT DETECTED Final   Giardia lamblia NOT DETECTED NOT DETECTED Final   Adenovirus F40/41 NOT DETECTED NOT DETECTED Final   Astrovirus NOT DETECTED NOT DETECTED Final   Norovirus GI/GII NOT DETECTED NOT DETECTED Final   Rotavirus A NOT DETECTED NOT DETECTED Final   Sapovirus (I, II, IV, and V) NOT DETECTED NOT DETECTED Final    Comment: Performed at [facility], [address] [place], [place] [address]      Radiology Studies: No results found.     Scheduled Meds:  heparin   5,000 Units Subcutaneous Q8H   octreotide   50 mcg Subcutaneous Q8H   Continuous Infusions:  sodium chloride  Stopped (11/05/23 0905)   cefTRIAXone  (ROCEPHIN )  IV 1 g (11/05/23 0905)     LOS: 3 days     Lebron JINNY Cage, MD [facility] 11/05/2023, 2:49 PM   Available via Epic secure chat 7am-7pm After these  hours, please refer  to coverage provider listed on [email or web address]

## 2023-11-05 NOTE — Patient Instructions (Signed)
 SURGICAL WAITING ROOM VISITATION  Patients having surgery or a procedure may have no more than 2 support people in the waiting area - these visitors may rotate.    Children under the age of 55 must have an adult with them who is not the patient.  Due to an increase in RSV and influenza rates and associated hospitalizations, children ages 55 and under may not visit patients in [facility] hospitals.  If the patient needs to stay at the hospital during part of their recovery, the visitor guidelines for inpatient rooms apply. Pre-op nurse will coordinate an appropriate time for 1 support person to accompany patient in pre-op.  This support person may not rotate.    Please refer to the [facility] website for the visitor guidelines for Inpatients (after your surgery is over and you are in a regular room).       Your procedure is scheduled on: 11/12/23   Report to [facility] Main Entrance    Report to admitting at 10:15 AM   Call this number if you have problems the morning of surgery [phone]   Do not eat food or drink liquids:After Midnight.     FOLLOW BOWEL PREP AND ANY ADDITIONAL PRE OP INSTRUCTIONS YOU RECEIVED FROM YOUR SURGEON'S OFFICE!!!     Oral Hygiene is also important to reduce your risk of infection.                                    Remember - BRUSH YOUR TEETH THE MORNING OF SURGERY WITH YOUR REGULAR TOOTHPASTE   Stop all vitamins and herbal supplements 7 days before surgery.   Take these medicines the morning of surgery with A SIP OF WATER : Octreotide  acetate             You may not have any metal on your body including hair pins, jewelry, and body piercing             Do not wear make-up, lotions, powders, perfumes/cologne, or deodorant  Do not wear nail polish including gel and S&S, artificial/acrylic nails, or any other type of covering on natural nails including finger and toenails. If you have artificial nails, gel coating, etc. that needs to be  removed by a nail salon please have this removed prior to surgery or surgery may need to be canceled/ delayed if the surgeon/ anesthesia feels like they are unable to be safely monitored.   Do not shave  48 hours prior to surgery.    Do not bring valuables to the hospital. [name] IS NOT             RESPONSIBLE   FOR VALUABLES.   Contacts, glasses, dentures or bridgework may not be worn into surgery.   Bring small overnight bag day of surgery.   DO NOT BRING YOUR HOME MEDICATIONS TO THE HOSPITAL. PHARMACY WILL DISPENSE MEDICATIONS LISTED ON YOUR MEDICATION LIST TO YOU DURING YOUR ADMISSION IN THE HOSPITAL!    Patients discharged on the day of surgery will not be allowed to drive home.  Someone NEEDS to stay with you for the first 24 hours after anesthesia.   Special Instructions: Bring a copy of your healthcare power of attorney and living will documents the day of surgery if you haven't scanned them before.              Please read over the following fact sheets  you were given: IF YOU HAVE QUESTIONS ABOUT YOUR PRE-OP INSTRUCTIONS PLEASE CALL [phone] Cynthia [name]   If you received a COVID test during your pre-op visit  it is requested that you wear a mask when out in public, stay away from anyone that may not be feeling well and notify your surgeon if you develop symptoms. If you test positive for Covid or have been in contact with anyone that has tested positive in the last 10 days please notify you surgeon.    [place] [place] - Preparing for Surgery Before surgery, you can play an important role.  Because skin is not sterile, your skin needs to be as free of germs as possible.  You can reduce the number of germs on your skin by washing with CHG (chlorahexidine gluconate) soap before surgery.  CHG is an antiseptic cleaner which kills germs and bonds with the skin to continue killing germs even after washing. Please DO NOT use if you have an allergy to CHG or antibacterial soaps.  If your  skin becomes reddened/irritated stop using the CHG and inform your nurse when you arrive at Short Stay. Do not shave (including legs and underarms) for at least 48 hours prior to the first CHG shower.  You may shave your face/neck.  Please follow these instructions carefully:  1.  Shower with CHG Soap the night before surgery and the  morning of surgery.  2.  If you choose to wash your hair, wash your hair first as usual with your normal  shampoo.  3.  After you shampoo, rinse your hair and body thoroughly to remove the shampoo.                             4.  Use CHG as you would any other liquid soap.  You can apply chg directly to the skin and wash.  Gently with a scrungie or clean washcloth.  5.  Apply the CHG Soap to your body ONLY FROM THE NECK DOWN.   Do   not use on face/ open                           Wound or open sores. Avoid contact with eyes, ears mouth and   genitals (private parts).                       Wash face,  Genitals (private parts) with your normal soap.             6.  Wash thoroughly, paying special attention to the area where your    surgery  will be performed.  7.  Thoroughly rinse your body with warm water  from the neck down.  8.  DO NOT shower/wash with your normal soap after using and rinsing off the CHG Soap.                9.  Pat yourself dry with a clean towel.            10.  Wear clean pajamas.            11.  Place clean sheets on your bed the night of your first shower and do not  sleep with pets. Day of Surgery : Do not apply any lotions/deodorants the morning of surgery.  Please wear clean clothes to the hospital/surgery center.  FAILURE TO FOLLOW THESE INSTRUCTIONS MAY  RESULT IN THE CANCELLATION OF YOUR SURGERY  PATIENT SIGNATURE_________________________________  NURSE SIGNATURE__________________________________  ________________________________________________________________________

## 2023-11-06 ENCOUNTER — Encounter (HOSPITAL_COMMUNITY)
Admission: RE | Admit: 2023-11-06 | Discharge: 2023-11-06 | Disposition: A | Discharge status: Home / Self Care | Payer: Medicare Other | Source: Ambulatory Visit | Attending: Family Medicine | Admitting: Family Medicine

## 2023-11-06 DIAGNOSIS — N179 Acute kidney failure, unspecified: Secondary | ICD-10-CM | POA: Diagnosis not present

## 2023-11-06 LAB — CBC WITH DIFFERENTIAL/PLATELET
Abs Immature Granulocytes: 0.03 10*3/uL (ref 0.00–0.07)
Basophils Absolute: 0 10*3/uL (ref 0.0–0.1)
Basophils Relative: 0 %
Eosinophils Absolute: 0.4 10*3/uL (ref 0.0–0.5)
Eosinophils Relative: 5 %
HCT: 26.8 % — ABNORMAL LOW (ref 36.0–46.0)
Hemoglobin: 8.2 g/dL — ABNORMAL LOW (ref 12.0–15.0)
Immature Granulocytes: 0 %
Lymphocytes Relative: 30 %
Lymphs Abs: 2.1 10*3/uL (ref 0.7–4.0)
MCH: 30 pg (ref 26.0–34.0)
MCHC: 30.6 g/dL (ref 30.0–36.0)
MCV: 98.2 fL (ref 80.0–100.0)
Monocytes Absolute: 0.4 10*3/uL (ref 0.1–1.0)
Monocytes Relative: 6 %
Neutro Abs: 4.1 10*3/uL (ref 1.7–7.7)
Neutrophils Relative %: 59 %
Platelets: 206 10*3/uL (ref 150–400)
RBC: 2.73 MIL/uL — ABNORMAL LOW (ref 3.87–5.11)
RDW: 14.2 % (ref 11.5–15.5)
WBC: 7 10*3/uL (ref 4.0–10.5)
nRBC: 0 % (ref 0.0–0.2)

## 2023-11-06 LAB — BASIC METABOLIC PANEL
Anion gap: 3 — ABNORMAL LOW (ref 5–15)
BUN: 39 mg/dL — ABNORMAL HIGH (ref 8–23)
CO2: 13 mmol/L — ABNORMAL LOW (ref 22–32)
Calcium: 7.9 mg/dL — ABNORMAL LOW (ref 8.9–10.3)
Chloride: 121 mmol/L — ABNORMAL HIGH (ref 98–111)
Creatinine, Ser: 1.55 mg/dL — ABNORMAL HIGH (ref 0.44–1.00)
GFR, Estimated: 37 mL/min — ABNORMAL LOW (ref >60)
Glucose, Bld: 78 mg/dL (ref 70–99)
Potassium: 4.9 mmol/L (ref 3.5–5.1)
Sodium: 137 mmol/L (ref 135–145)

## 2023-11-06 MED ORDER — TRAMADOL HCL 50 MG PO TABS
50.0000 mg | ORAL_TABLET | Freq: Three times a day (TID) | ORAL | Status: DC | PRN
  Administered 2023-11-06: 50 mg via ORAL
  Filled 2023-11-06: qty 1

## 2023-11-06 MED ORDER — SODIUM BICARBONATE 650 MG PO TABS
650.0000 mg | ORAL_TABLET | Freq: Three times a day (TID) | ORAL | Status: DC
  Administered 2023-11-06 – 2023-11-07 (×4): 650 mg via ORAL
  Filled 2023-11-06 (×4): qty 1

## 2023-11-06 NOTE — Progress Notes (Signed)
 PROGRESS NOTE    Cynthia [name]  FMW:[id number] DOB: 1961/02/16 DOA: 11/02/2023 PCP: Joyce Norleen BROCKS, MD     Brief Narrative:  Cynthia [name] is a 63 y.o. female with medical history significant for Crohn's disease status post ileocolectomy, sigmoid colectomy with ileostomy, anxiety, depression, hypertension, CKD stage IIIb with recent hospital stay earlier this month and nephrostomy tube placement due to left UPJ obstructing stone being admitted to the hospital with recurrent chronic diarrhea, and acute renal failure superimposed on CKD stage IIIb.  She was doing well since hospital discharge until about 1 week ago when her stool output became quite copious.  Says that her ostomy bag has been filling up in a matter of just a couple of hours. Patient was admitted for acute kidney injury due to high ostomy output, UTI and was started on IV antibiotics as well as IV fluid.   New events last 24 hours / Subjective: Today, reports improvement, ostomy output slowing down, reports some chronic back pain.  Denies any chest pain, abdominal pain, nausea/vomiting, dysuria, fever/chills.    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 Roseann on 11/05/2023, no intervention while in hospital.  Patient scheduled for left percutaneous nephrolithotomy on 11/12/2023.  Agree to continue antibiotics till 11/12/2023 Start p.o. bicarb Daily BMP   Anemia of chronic disease Hemoglobin around 10 Likely  worsened by hemodilution from IV fluids Daily CBC  UTI, present on admission Urine culture growing Proteus penneri Continue Rocephin   High ostomy output in setting of Crohn's ileitis status post ileocolectomy/ileostomy Likely from recent antibiotic use GI panel and C. difficile panel negative Continue Globe octreotide  Monitor  Prediabetes A1c 5.7  Social issues TOC consulted for housing issues.  Patient states that she currently lives in a hotel, recently sold her house and has not been able to get a short-term rental yet Patient will be discharged back to hotel   DVT prophylaxis:  heparin  injection 5,000 Units Start: 11/02/23 1400 SCDs Start: 11/02/23 1117  Code Status: Full code Family Communication: None at bedside Disposition Plan: Back to hotel on discharge Status is: Inpatient Remains inpatient appropriate because: IV antibiotics    Antimicrobials:  Anti-infectives (From admission, onward)    Start     Dose/Rate Route Frequency Ordered Stop   11/03/23 0900  cefTRIAXone  (ROCEPHIN ) 1 g in sodium chloride  0.9 % 100 mL IVPB        1 g 200 mL/hr over 30 Minutes Intravenous Every 24 hours 11/03/23 0811     11/02/23 1030  cefTRIAXone  (ROCEPHIN ) 1 g in sodium chloride  0.9 % 100 mL IVPB        1 g 200 mL/hr over 30 Minutes Intravenous  Once 11/02/23 1018 11/02/23 1128        Objective: Vitals:   11/05/23 1337 11/05/23 2057 11/06/23 0610 11/06/23 1328  BP: (!) 96/55 100/67 101/65 103/61  Pulse: 76 81 67 81  Resp:  18 18 15   Temp: (!) 97.4 F (36.3 C) 97.9 F (36.6  C) 97.7 F (36.5 C) 97.8 F (36.6 C)  TempSrc: Oral     SpO2: 100% 100% 100% 100%  Weight:      Height:        Intake/Output Summary (Last 24 hours) at 11/06/2023 1532 Last data filed at 11/06/2023 1000 Gross per 24 hour  Intake 2038.33 ml  Output 275 ml  Net 1763.33 ml   Filed Weights   11/04/23 2142  Weight: 57.7 kg    Examination:  General: NAD  Cardiovascular: S1, S2  present Respiratory: CTAB Abdomen: Soft, nontender, nondistended, bowel sounds present, noted left nephrostomy tube, ostomy present Musculoskeletal: No bilateral pedal edema noted Skin: Normal Psychiatry: Normal mood    Data Reviewed: I have personally reviewed following labs and imaging studies  CBC: Recent Labs  Lab 11/02/23 0113 11/03/23 0409 11/05/23 0505 11/06/23 0456  WBC 11.7* 8.7 6.5 7.0  NEUTROABS 8.2*  --  3.6 4.1  HGB 12.8 10.5* 8.2* 8.2*  HCT 38.5 33.6* 26.4* 26.8*  MCV 89.3 98.2 96.4 98.2  PLT 306 218 204 206   Basic Metabolic Panel: Recent Labs  Lab 11/02/23 0113 11/03/23 0409 11/04/23 0444 11/05/23 0505 11/06/23 0456  NA 129* 129* 133* 134* 137  K 3.5 4.2 4.7 4.7 4.9  CL 100 110 115* 117* 121*  CO2 17* 11* 15* 14* 13*  GLUCOSE 97 114* 86 84 78  BUN 63* 53* 51* 50* 39*  CREATININE 2.78* 1.82* 2.01* 1.97* 1.55*  CALCIUM  8.5* 8.2* 8.0* 7.9* 7.9*  MG 2.2  --  1.7  --   --    GFR: Estimated Creatinine Clearance: 31.7 mL/min (A) (by C-G formula based on SCr of 1.55 mg/dL (H)). Liver Function Tests: Recent Labs  Lab 11/02/23 0113  AST 13*  ALT 10  ALKPHOS 74  BILITOT 0.7  PROT 7.4  ALBUMIN  3.5   No results for input(s): LIPASE, AMYLASE in the last 168 hours. No results for input(s): AMMONIA in the last 168 hours. Coagulation Profile: Recent Labs  Lab 11/02/23 0244  INR 1.4*   Cardiac Enzymes: No results for input(s): CKTOTAL, CKMB, CKMBINDEX, TROPONINI in the last 168 hours. BNP (last 3 results) No results for input(s): PROBNP in the last 8760 hours. HbA1C: No results for input(s): HGBA1C in the last 72 hours. CBG: No results for input(s): GLUCAP in the last 168 hours. Lipid Profile: No results for input(s): CHOL, HDL, LDLCALC, TRIG, CHOLHDL, LDLDIRECT in the last 72 hours. Thyroid  Function Tests: No results for input(s): TSH, T4TOTAL, FREET4, T3FREE, THYROIDAB in the last 72 hours. Anemia  Panel: No results for input(s): VITAMINB12, FOLATE, FERRITIN, TIBC, IRON , RETICCTPCT in the last 72 hours. Sepsis Labs: Recent Labs  Lab 11/02/23 0234  LATICACIDVEN 1.9    Recent Results (from the past 240 hours)  Blood Culture (routine x 2)     Status: None (Preliminary result)   Collection Time: 11/02/23  1:13 AM   Specimen: BLOOD  Result Value Ref Range Status   Specimen Description   Final    BLOOD Performed at [facility], [address], [place], [place] [address]    Special Requests   Final    Blood Culture results may not be optimal due to an inadequate volume of blood received in culture bottles BOTTLES DRAWN AEROBIC AND ANAEROBIC Performed at [facility], [address] [place], [place] [address]    Culture   Final    NO GROWTH 4 DAYS Performed at [facility],  [address], [place], [place] [address]    Report Status PENDING  Incomplete  Resp panel by RT-PCR (RSV, Flu A&B, Covid) Anterior Nasal Swab     Status: None   Collection Time: 11/02/23  3:05 AM   Specimen: Anterior Nasal Swab  Result Value Ref Range Status   SARS Coronavirus 2 by RT PCR NEGATIVE NEGATIVE Final    Comment: (NOTE) SARS-CoV-2 target nucleic acids are NOT DETECTED.  The SARS-CoV-2 RNA is generally detectable in upper respiratory specimens during the acute phase of infection. The lowest concentration of SARS-CoV-2 viral copies this assay can detect is 138 copies/mL. A negative result does not preclude SARS-Cov-2 infection and should not be used as the sole basis for treatment or other patient management decisions. A negative result may occur with  improper specimen collection/handling, submission of specimen other than nasopharyngeal swab, presence of viral mutation(s) within the areas targeted by this assay, and inadequate number of viral copies(<138 copies/mL). A negative result must be combined with clinical observations,  patient history, and epidemiological information. The expected result is Negative.  Fact Sheet for Patients:  bloggercourse.com  Fact Sheet for Healthcare Providers:  seriousbroker.it  This test is no t yet approved or cleared by the [place]  FDA and  has been authorized for detection and/or diagnosis of SARS-CoV-2 by FDA under an Emergency Use Authorization (EUA). This EUA will remain  in effect (meaning this test can be used) for the duration of the COVID-19 declaration under Section 564(b)(1) of the Act, 21 U.S.C.section 360bbb-3(b)(1), unless the authorization is terminated  or revoked sooner.       Influenza A by PCR NEGATIVE NEGATIVE Final   Influenza B by PCR NEGATIVE NEGATIVE Final    Comment: (NOTE) The Xpert Xpress SARS-CoV-2/FLU/RSV plus assay is intended as an aid in the diagnosis of influenza from Nasopharyngeal swab specimens and should not be used as a sole basis for treatment. Nasal washings and aspirates are unacceptable for Xpert Xpress SARS-CoV-2/FLU/RSV testing.  Fact Sheet for Patients: bloggercourse.com  Fact Sheet for Healthcare Providers: seriousbroker.it  This test is not yet approved or cleared by the [place]  FDA and has been authorized for detection and/or diagnosis of SARS-CoV-2 by FDA under an Emergency Use Authorization (EUA). This EUA will remain in effect (meaning this test can be used) for the duration of the COVID-19 declaration under Section 564(b)(1) of the Act, 21 U.S.C. section 360bbb-3(b)(1), unless the authorization is terminated or revoked.     Resp Syncytial Virus by PCR NEGATIVE NEGATIVE Final    Comment: (NOTE) Fact Sheet for Patients: bloggercourse.com  Fact Sheet for Healthcare Providers: seriousbroker.it  This test is not yet approved or cleared by the [place]  [place] FDA and has been authorized for detection and/or diagnosis of SARS-CoV-2 by FDA under an Emergency Use Authorization (EUA). This EUA will remain in effect (meaning this test can be used) for the duration of the COVID-19 declaration under Section 564(b)(1) of the Act, 21 U.S.C. section 360bbb-3(b)(1), unless the authorization is terminated or revoked.  Performed at [facility], [address] [place], [place] [address]   Urine Culture     Status: Abnormal   Collection Time: 11/02/23  7:29 AM   Specimen: Urine, Random  Result Value Ref Range Status   Specimen Description   Final    URINE, RANDOM Performed at [facility], [address] [place], [place] [address]    Special Requests   Final  NONE Reflexed from [id number] Performed at [facility], [name] [place], [place] [address]    Culture 60,000 COLONIES/mL PROTEUS PENNERI (A)  Final   Report Status 11/04/2023 FINAL  Final   Organism ID, Bacteria PROTEUS PENNERI (A)  Final      Susceptibility   Proteus penneri - MIC*    AMPICILLIN  >=32 RESISTANT Resistant     CEFEPIME  <=0.12 SENSITIVE Sensitive     CEFTRIAXONE  <=0.25 SENSITIVE Sensitive     CIPROFLOXACIN  <=0.25 SENSITIVE Sensitive     GENTAMICIN  <=1 SENSITIVE Sensitive     IMIPENEM 2 SENSITIVE Sensitive     NITROFURANTOIN  RESISTANT Resistant     TRIMETH /SULFA  <=20 SENSITIVE Sensitive     AMPICILLIN /SULBACTAM 8 SENSITIVE Sensitive     PIP/TAZO <=4 SENSITIVE Sensitive ug/mL    * 60,000 COLONIES/mL PROTEUS PENNERI  Blood Culture (routine x 2)     Status: None (Preliminary result)   Collection Time: 11/02/23 12:14 PM   Specimen: BLOOD RIGHT ARM  Result Value Ref Range Status   Specimen Description   Final    BLOOD RIGHT ARM Performed at [facility], [address] [place], [place] [address]    Special Requests   Final    BOTTLES DRAWN AEROBIC ONLY Blood Culture results may not be optimal due  to an inadequate volume of blood received in culture bottles Performed at [facility], [address], [place], [place] [address]    Culture   Final    NO GROWTH 4 DAYS Performed at [facility], [address], [place], [place] [address]    Report Status PENDING  Incomplete  C Difficile Quick Screen w PCR reflex     Status: None   Collection Time: 11/03/23  1:12 PM   Specimen: STOOL  Result Value Ref Range Status   C Diff antigen NEGATIVE NEGATIVE Final   C Diff toxin NEGATIVE NEGATIVE Final   C Diff interpretation No C. difficile detected.  Final    Comment: Performed at [facility], [address] [place], [place] [address]  Gastrointestinal Panel by PCR , Stool     Status: None   Collection Time: 11/04/23  4:19 PM   Specimen: Stool  Result Value Ref Range Status   Campylobacter species NOT DETECTED NOT DETECTED Final   Plesimonas shigelloides NOT DETECTED NOT DETECTED Final   Salmonella species NOT DETECTED NOT DETECTED Final   Yersinia enterocolitica NOT DETECTED NOT DETECTED Final   Vibrio species NOT DETECTED NOT DETECTED Final   Vibrio cholerae NOT DETECTED NOT DETECTED Final   Enteroaggregative E coli (EAEC) NOT DETECTED NOT DETECTED Final   Enteropathogenic E coli (EPEC) NOT DETECTED NOT DETECTED Final   Enterotoxigenic E coli (ETEC) NOT DETECTED NOT DETECTED Final   Shiga like toxin producing E coli (STEC) NOT DETECTED NOT DETECTED Final   Shigella/Enteroinvasive E coli (EIEC) NOT DETECTED NOT DETECTED Final   Cryptosporidium NOT DETECTED NOT DETECTED Final   Cyclospora cayetanensis NOT DETECTED NOT DETECTED Final   Entamoeba histolytica NOT DETECTED NOT DETECTED Final   Giardia lamblia NOT DETECTED NOT DETECTED Final   Adenovirus F40/41 NOT DETECTED NOT DETECTED Final   Astrovirus NOT DETECTED NOT DETECTED Final   Norovirus GI/GII NOT DETECTED NOT DETECTED Final   Rotavirus A NOT DETECTED NOT DETECTED Final   Sapovirus  (I, II, IV, and V) NOT DETECTED NOT DETECTED Final    Comment: Performed at [facility], [address], [place], [place] [address]      Radiology Studies: No results found.     Scheduled Meds:  heparin   5,000 Units Subcutaneous Q8H   octreotide   50 mcg Subcutaneous Q8H   sodium bicarbonate   650 mg Oral TID   Continuous Infusions:  sodium chloride  75 mL/hr at 11/06/23 0750   cefTRIAXone  (ROCEPHIN )  IV 1 g (11/06/23 0830)     LOS: 4 days     Lebron JINNY Cage, MD [facility] 11/06/2023, 3:32 PM   Available via Epic secure chat 7am-7pm After these hours, please refer to coverage provider listed on [email or web address]

## 2023-11-07 DIAGNOSIS — N179 Acute kidney failure, unspecified: Secondary | ICD-10-CM | POA: Diagnosis not present

## 2023-11-07 LAB — CBC WITH DIFFERENTIAL/PLATELET
Abs Immature Granulocytes: 0.03 10*3/uL (ref 0.00–0.07)
Basophils Absolute: 0 10*3/uL (ref 0.0–0.1)
Basophils Relative: 0 %
Eosinophils Absolute: 0.4 10*3/uL (ref 0.0–0.5)
Eosinophils Relative: 5 %
HCT: 26.2 % — ABNORMAL LOW (ref 36.0–46.0)
Hemoglobin: 8.2 g/dL — ABNORMAL LOW (ref 12.0–15.0)
Immature Granulocytes: 0 %
Lymphocytes Relative: 27 %
Lymphs Abs: 1.9 10*3/uL (ref 0.7–4.0)
MCH: 30.9 pg (ref 26.0–34.0)
MCHC: 31.3 g/dL (ref 30.0–36.0)
MCV: 98.9 fL (ref 80.0–100.0)
Monocytes Absolute: 0.5 10*3/uL (ref 0.1–1.0)
Monocytes Relative: 7 %
Neutro Abs: 4.1 10*3/uL (ref 1.7–7.7)
Neutrophils Relative %: 61 %
Platelets: 203 10*3/uL (ref 150–400)
RBC: 2.65 MIL/uL — ABNORMAL LOW (ref 3.87–5.11)
RDW: 14.5 % (ref 11.5–15.5)
WBC: 6.9 10*3/uL (ref 4.0–10.5)
nRBC: 0 % (ref 0.0–0.2)

## 2023-11-07 LAB — BASIC METABOLIC PANEL
Anion gap: 3 — ABNORMAL LOW (ref 5–15)
BUN: 29 mg/dL — ABNORMAL HIGH (ref 8–23)
CO2: 15 mmol/L — ABNORMAL LOW (ref 22–32)
Calcium: 7.9 mg/dL — ABNORMAL LOW (ref 8.9–10.3)
Chloride: 120 mmol/L — ABNORMAL HIGH (ref 98–111)
Creatinine, Ser: 1.66 mg/dL — ABNORMAL HIGH (ref 0.44–1.00)
GFR, Estimated: 34 mL/min — ABNORMAL LOW (ref >60)
Glucose, Bld: 74 mg/dL (ref 70–99)
Potassium: 5.1 mmol/L (ref 3.5–5.1)
Sodium: 138 mmol/L (ref 135–145)

## 2023-11-07 LAB — CULTURE, BLOOD (ROUTINE X 2)
Culture: NO GROWTH
Culture: NO GROWTH

## 2023-11-07 MED ORDER — CEPHALEXIN 500 MG PO CAPS: 500.0000 mg | ORAL_CAPSULE | Freq: Two times a day (BID) | ORAL | 0 refills | Status: DC

## 2023-11-07 MED ORDER — SODIUM BICARBONATE 650 MG PO TABS: 650.0000 mg | ORAL_TABLET | Freq: Three times a day (TID) | ORAL | 0 refills | Status: AC

## 2023-11-07 MED ORDER — SENNOSIDES-DOCUSATE SODIUM 8.6-50 MG PO TABS: 1.0000 | ORAL_TABLET | Freq: Two times a day (BID) | ORAL | Status: AC | PRN

## 2023-11-07 NOTE — Progress Notes (Signed)
 Chaplain rec'd page noting that pt is being discharged today and needs clothes. Chaplain checked in with pt who confirmed that she needs clothes. Chaplain provided pt with 1 pair size 12 pants, 2 tops, 1 pair socks, 1 coat, 1 winter hat.

## 2023-11-07 NOTE — Discharge Summary (Signed)
 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 female with medical history significant for Crohn's disease status post ileocolectomy, sigmoid colectomy with ileostomy, anxiety, depression, hypertension, CKD stage IIIb with recent hospital stay earlier this month and nephrostomy tube placement due to left UPJ obstructing stone being admitted to the hospital with recurrent chronic diarrhea, and acute renal failure superimposed on CKD stage IIIb.  She was doing well since hospital discharge until about 1 week ago when her stool output became quite copious.  Says that her ostomy bag has been filling up in a matter of just a couple of hours. Patient was admitted for acute kidney injury due to high ostomy output, UTI and was started on IV antibiotics as well as IV fluid.     Today, patient denies any new complaints.  Reports overall improvements, with ostomy output slowing down.  Denies any abdominal pain, nausea/vomiting, dysuria, fever/chills, chest pain, shortness of breath.    Assessment and Plan: AKI on CKD stage 3b  Metabolic acidosis-persistent, likely from ongoing ostomy output Left UPJ stone Likely in setting of high ostomy output, GI loss  Baseline creatinine around 1.8 Has left UPJ stone, nephrostomy tube is in  place Discussed with urology Dr. Roseann on 11/05/2023, patient scheduled for left percutaneous nephrolithotomy on 11/12/2023.  Agree to continue antibiotics till 11/12/2023 Continue p.o. bicarb x 2 days Follow-up with urology, PCP   Anemia of chronic disease Hemoglobin around 10 Likely worsened by hemodilution from IV fluids Follow-up with PCP   UTI, present on admission Urine culture growing Proteus penneri IV Rocephin  x 5 days, will discharge on p.o. Keflex  x 7 more days due to upcoming urologic procedure as discussed with urology   High ostomy output in setting of Crohn's ileitis status post ileocolectomy/ileostomy Likely from recent antibiotic use GI panel and C. difficile panel negative Continue [place] octreotide    Prediabetes A1c 5.7   Social issues TOC consulted for housing issues.  Patient states that she currently lives in a hotel, recently sold her house and has not been able to get a short-term rental yet Patient will be discharged back to hotel     Consultants: None Procedures performed: None Disposition: Home/hotel Diet recommendation:  Carb modified diet    DISCHARGE MEDICATION: Allergies as of 11/07/2023       Reactions   Nsaids Other (See Comments)   Crohn's disease & CKD   Ace Inhibitors Cough   Iodine Other (See Comments)   The patient said she had a reaction from iodine in some eye drops- caused eyes to turn red;  pt has no reactions to CT contrast        Medication List     STOP taking these medications    alum & mag hydroxide-simeth 200-200-20 MG/5ML suspension Commonly known as: MAALOX/MYLANTA   diphenhydramine -acetaminophen  25-500 MG Tabs tablet Commonly  known as: TYLENOL  PM       TAKE these medications    cephALEXin  500 MG capsule Commonly known as: KEFLEX  Take 1 capsule (500 mg total) by mouth 2 (two) times daily for 7 days. Start taking on: November 08, 2023   loperamide  2 MG capsule Commonly known as: IMODIUM  Take 1 capsule (2 mg  total) by mouth 3 (three) times daily as needed for diarrhea or loose stools.   Octreotide  Acetate 50 MCG/ML Sosy Inject 1 ml (50mcg) into the skin every 8 hours.   senna-docusate 8.6-50 MG tablet Commonly known as: Senokot-S Take 1 tablet by mouth 2 (two) times daily as needed for mild constipation. What changed:  when to take this reasons to take this   sodium bicarbonate  650 MG tablet Take 1 tablet (650 mg total) by mouth 3 (three) times daily for 2 days.   Stelara 90 MG/ML Sosy injection Generic drug: ustekinumab Inject 90 mg into the skin every 3 (three) months.   SYRINGE-NEEDLE (DISP) 3 ML 25G X 5/8 3 ML Misc Use as directed for octreotide  injection        Follow-up Information     [facility] Follow up.   Contact information: [address] [place], [place] [address] [phone]        [facility] Follow up.   Contact information: [address] [place], [place] [address] [phone]        [facility] Follow up.   Contact information: [address] [place], [place] [address] [phone]        [facility] Follow up.   Contact information: [address] [place], [place] [address] [phone]        [facility] Follow up.   Contact information: [address] [place], [place] [address] [phone]        [facility] Follow up.   Contact information: [address] [place], [place] [address] [phone]        Joyce Norleen BROCKS, MD. Schedule an appointment as soon as possible for a visit in 1 week(s).   Specialty: Family Medicine Contact information: [address] [place] [address] [phone]         Shane Steffan BROCKS, MD Follow up.   Specialty: Urology Why: This is the Urologist that will be taking care of your kidney stone. Please  call office to find out where to go to and what time to come for the procedure. Scheduled for 11/10/23 Contact information: [address] [place] [place] [address] [phone]                Discharge Exam: Fredricka Weights   11/04/23 2142  Weight: 57.7 kg   General: NAD  Cardiovascular: S1, S2 present Respiratory: CTAB Abdomen: Soft, nontender, nondistended, bowel sounds present, noted left nephrostomy drain, ostomy present Musculoskeletal: No bilateral pedal edema noted Skin: Normal Psychiatry: Normal mood   Condition at discharge: stable  The results of significant diagnostics from this hospitalization (including imaging, microbiology, ancillary and laboratory) are listed below for reference.   Imaging Studies: CT ABDOMEN PELVIS WO CONTRAST Result Date: 11/02/2023 CLINICAL DATA:  Abdominal pain.  History of Crohn disease. EXAM: CT ABDOMEN AND PELVIS WITHOUT CONTRAST TECHNIQUE: Multidetector CT imaging of the abdomen and pelvis was performed following the standard protocol without IV contrast. RADIATION DOSE REDUCTION: This exam  was performed according to the departmental dose-optimization program which includes automated exposure control, adjustment of the mA and/or kV according to patient size and/or use of iterative reconstruction technique. COMPARISON:  10/09/2023 FINDINGS: Lower chest: Dependent atelectasis noted right lower lobe. Hepatobiliary: No suspicious focal abnormality in the liver on this study without intravenous contrast. Small gallstone noted towards the fundus. No intrahepatic or extrahepatic biliary dilation. Pancreas: No focal mass lesion. No dilatation of the main duct. No intraparenchymal cyst. No peripancreatic edema. Spleen: No splenomegaly. No suspicious focal mass lesion. Adrenals/Urinary Tract: No adrenal nodule or mass. Right kidney unremarkable. Left percutaneous nephrostomy tube again noted with large stone in the central left renal pelvis. No  evidence for hydroureter. The urinary bladder appears normal for the degree of distention. Stomach/Bowel: Stomach is unremarkable. No gastric wall thickening. No evidence of outlet obstruction. Duodenum is normally positioned as is the ligament of Treitz. No small bowel wall thickening. No small bowel dilatation. Stable postop changes from subtotal colectomy with right sided ileostomy in mucous fistula noted left side. No substantial mesenteric edema or congestion. Vascular/Lymphatic: There is mild atherosclerotic calcification of the abdominal aorta without aneurysm. There is no gastrohepatic or hepatoduodenal ligament lymphadenopathy. No retroperitoneal or mesenteric lymphadenopathy. No pelvic sidewall lymphadenopathy. Reproductive: Hysterectomy.  There is no adnexal mass. Other: No intraperitoneal free fluid. Musculoskeletal: No worrisome lytic or sclerotic osseous abnormality. IMPRESSION: 1. No acute findings in the abdomen or pelvis. Specifically, no findings to explain the patient's history of abdominal pain. 2. Stable postop changes involving small bowel and colon. No small bowel wall thickening. No mesenteric edema or congestion. No bowel obstruction. 3. Left percutaneous nephrostomy tube again noted with large stone in the central left renal pelvis. No evidence for hydroureter. 4. Cholelithiasis. 5.  Aortic Atherosclerosis (ICD10-I70.0). Electronically Signed   By: Camellia Candle M.D.   On: 11/02/2023 05:51   DG Abdomen Acute W/Chest Result Date: 11/02/2023 CLINICAL DATA:  High ostomy output.  Sepsis.  Extreme pain. EXAM: DG ABDOMEN ACUTE WITH 1 VIEW CHEST COMPARISON:  CT 10/09/2023 FINDINGS: The lungs are clear. No focal airspace disease. The heart is normal in size. No pleural fluid or pneumothorax. No free intra-abdominal air. Left-sided nephrostomy tube with coil projecting over the region of the left renal pelvis. The previous left renal pelvis stones are not definitively seen. Generalized paucity  of bowel gas. No gaseous bowel distension or evidence of obstruction. No free intra-abdominal air. Rounded density in the right upper abdomen corresponds to ostomy on prior CT. IMPRESSION: 1. Generalized paucity of bowel gas. No gaseous bowel distension or evidence of obstruction. 2. Left-sided nephrostomy tube with coiled projecting over the region of the left renal pelvis. The previous left renal pelvis stones are not definitively seen. 3. No acute chest findings. Electronically Signed   By: Andrea Gasman M.D.   On: 11/02/2023 02:06   IR NEPHROSTOMY PLACEMENT LEFT Result Date: 10/10/2023 INDICATION: 61 year with new left staghorn calculus and hydronephrosis. EXAM: IR NEPHROSTOMY PLACEMENT LEFT COMPARISON:  None Available. MEDICATIONS: In patient already receiving intravenous antibiotics. No additional prophylaxis was administered. ANESTHESIA/SEDATION: Fentanyl  2 mcg IV; Versed  100 mg IV administered by the radiology nurse Moderate Sedation Time:  10 minutes The patient's vital signs and level consciousness were continuously monitored during the procedure by the interventional radiology nurse under my direct supervision. CONTRAST:  15mL OMNIPAQUE  IOHEXOL  300 MG/ML SOLN - administered into the collecting system(s) FLUOROSCOPY: Radiation exposure index: 8 mGy reference air kerma COMPLICATIONS: None immediate. TECHNIQUE: The  procedure, risks, benefits, and alternatives were explained to the patient. Questions regarding the procedure were encouraged and answered. The patient understands and consents to the procedure. The left flank was prepped with chlorhexidine  in a sterile fashion, and a sterile drape was applied covering the operative field. A sterile gown and sterile gloves were used for the procedure. Local anesthesia was provided with 1% Lidocaine . The left flank was interrogated with ultrasound and the left kidney identified. The kidney is hydronephrotic. A suitable access site on the skin overlying the  lower pole, posterior calix was identified. After local mg anesthesia was achieved, a small skin nick was made with an 11 blade scalpel. A 21 gauge Accustick needle was then advanced under direct sonographic guidance into the lower pole of the left kidney. A 0.018 inch wire was advanced under fluoroscopic guidance into the left renal collecting system. The Accustick sheath was then advanced over the wire and a 0.018 system exchanged for a 0.035 system. Gentle hand injection of contrast material confirms placement of the sheath within the renal collecting system. There is hydronephrosis. Greater than expected filling defect within the renal pelvis and lower pole infundibulum. The tract from the scan into the renal collecting system was then dilated serially to 10-French. A 10-French Cook all-purpose drain was then placed and positioned under fluoroscopic guidance. The locking loop is well formed within the left renal pelvis. The catheter was secured to the skin with 0 Prolene and a sterile bandage was placed. Catheter was left to gravity bag drainage. IMPRESSION: Successful placement of a left 10 French percutaneous nephrostomy tube. Of note, the filling defect within the renal pelvis and lower pole infundibulum and calices is greater than expected based on the CT images from yesterday. This suggests that a large component of the stone burden is radiolucent, or that there is underlying soft tissue mass or blood products/thrombus as well as the visible radiopaque stone. Electronically Signed   By: Wilkie Lent M.D.   On: 10/10/2023 16:24   CT ABDOMEN PELVIS WO CONTRAST Result Date: 10/09/2023 CLINICAL DATA:  Abdominal pain, nausea and vomiting, history of Crohn disease EXAM: CT ABDOMEN AND PELVIS WITHOUT CONTRAST TECHNIQUE: Multidetector CT imaging of the abdomen and pelvis was performed following the standard protocol without IV contrast. Unenhanced CT was performed per clinician order. Lack of IV contrast  limits sensitivity and specificity, especially for evaluation of abdominal/pelvic solid viscera. RADIATION DOSE REDUCTION: This exam was performed according to the departmental dose-optimization program which includes automated exposure control, adjustment of the mA and/or kV according to patient size and/or use of iterative reconstruction technique. COMPARISON:  07/01/2022 FINDINGS: Lower chest: Subpleural consolidation and volume loss within the medial aspect of the right lower lobe, favor atelectasis and/or scarring. No acute airspace disease. Hepatobiliary: Cholelithiasis without cholecystitis. Unremarkable unenhanced appearance of the liver. Pancreas: Unremarkable unenhanced appearance. Spleen: Unremarkable unenhanced appearance. Adrenals/Urinary Tract: 2.5 cm staghorn type calculus at the left UPJ, with resulting moderate to severe left hydronephrosis. The right kidney is unremarkable. The adrenals and bladder are normal. Stomach/Bowel: Stable postsurgical changes from subtotal colectomy, Hartmann's pouch, right-sided ileostomy, and left-sided mucous fistula. No bowel obstruction or ileus. No bowel wall thickening or inflammatory change. Vascular/Lymphatic: Aortic atherosclerosis. No enlarged abdominal or pelvic lymph nodes. Reproductive: Status post hysterectomy. No adnexal masses. Other: No free fluid or free intraperitoneal gas. No abdominal wall hernia. Musculoskeletal: No acute or destructive bony abnormalities. Reconstructed images demonstrate no additional findings. IMPRESSION: 1. Obstructing 2.5 cm staghorn calculus at the  left UPJ, with moderate to severe left-sided hydronephrosis. 2. Cholelithiasis without cholecystitis. 3. Extensive postsurgical changes of the bowel, stable since prior study. No obstruction or ileus. No inflammatory change. 4. Subpleural consolidation and volume loss within the medial basilar segment of the right lower lobe, likely scarring or atelectasis. 5.  Aortic Atherosclerosis  (ICD10-I70.0). Electronically Signed   By: Ozell Daring M.D.   On: 10/09/2023 22:15    Microbiology: Results for orders placed or performed during the hospital encounter of 11/02/23  Blood Culture (routine x 2)     Status: None   Collection Time: 11/02/23  1:13 AM   Specimen: BLOOD  Result Value Ref Range Status   Specimen Description   Final    BLOOD Performed at [facility], [address], [place], [place] [address]    Special Requests   Final    Blood Culture results may not be optimal due to an inadequate volume of blood received in culture bottles BOTTLES DRAWN AEROBIC AND ANAEROBIC Performed at [facility], [address], [place], [place] [address]    Culture   Final    NO GROWTH 5 DAYS Performed at [facility], [address] [place], [place] [address]    Report Status 11/07/2023 FINAL  Final  Resp panel by RT-PCR (RSV, Flu A&B, Covid) Anterior Nasal Swab     Status: None   Collection Time: 11/02/23  3:05 AM   Specimen: Anterior Nasal Swab  Result Value Ref Range Status   SARS Coronavirus 2 by RT PCR NEGATIVE NEGATIVE Final    Comment: (NOTE) SARS-CoV-2 target nucleic acids are NOT DETECTED.  The SARS-CoV-2 RNA is generally detectable in upper respiratory specimens during the acute phase of infection. The lowest concentration of SARS-CoV-2 viral copies this assay can detect is 138 copies/mL. A negative result does not preclude SARS-Cov-2 infection and should not be used as the sole basis for treatment or other patient management decisions. A negative result may occur with  improper specimen collection/handling, submission of specimen other than nasopharyngeal swab, presence of viral mutation(s) within the areas targeted by this assay, and inadequate number of viral copies(<138 copies/mL). A negative result must be combined with clinical observations, patient history, and epidemiological information. The expected  result is Negative.  Fact Sheet for Patients:  bloggercourse.com  Fact Sheet for Healthcare Providers:  seriousbroker.it  This test is no t yet approved or cleared by the [place]  FDA and  has been authorized for detection and/or diagnosis of SARS-CoV-2 by FDA under an Emergency Use Authorization (EUA). This EUA will remain  in effect (meaning this test can be used) for the duration of the COVID-19 declaration under Section 564(b)(1) of the Act, 21 U.S.C.section 360bbb-3(b)(1), unless the authorization is terminated  or revoked sooner.       Influenza A by PCR NEGATIVE NEGATIVE Final   Influenza B by PCR NEGATIVE NEGATIVE Final    Comment: (NOTE) The Xpert Xpress SARS-CoV-2/FLU/RSV plus assay is intended as an aid in the diagnosis of influenza from Nasopharyngeal swab specimens and should not be used as a sole basis for treatment. Nasal washings and aspirates are unacceptable for Xpert Xpress SARS-CoV-2/FLU/RSV testing.  Fact Sheet for Patients: bloggercourse.com  Fact Sheet for Healthcare Providers: seriousbroker.it  This test is not yet approved or cleared by the [place]  FDA and has been authorized for detection and/or diagnosis of SARS-CoV-2 by FDA under an Emergency Use Authorization (EUA). This EUA will remain in  effect (meaning this test can be used) for the duration of the COVID-19 declaration under Section 564(b)(1) of the Act, 21 U.S.C. section 360bbb-3(b)(1), unless the authorization is terminated or revoked.     Resp Syncytial Virus by PCR NEGATIVE NEGATIVE Final    Comment: (NOTE) Fact Sheet for Patients: bloggercourse.com  Fact Sheet for Healthcare Providers: seriousbroker.it  This test is not yet approved or cleared by the [place]  FDA and has been authorized for detection and/or diagnosis of  SARS-CoV-2 by FDA under an Emergency Use Authorization (EUA). This EUA will remain in effect (meaning this test can be used) for the duration of the COVID-19 declaration under Section 564(b)(1) of the Act, 21 U.S.C. section 360bbb-3(b)(1), unless the authorization is terminated or revoked.  Performed at [facility], [address], [place], [place] [address]   Urine Culture     Status: Abnormal   Collection Time: 11/02/23  7:29 AM   Specimen: Urine, Random  Result Value Ref Range Status   Specimen Description   Final    URINE, RANDOM Performed at [facility], [address], [place], [place] [address]    Special Requests   Final    NONE Reflexed from [id number] Performed at [facility], [address], [place], [place] [address]    Culture 60,000 COLONIES/mL PROTEUS PENNERI (A)  Final   Report Status 11/04/2023 FINAL  Final   Organism ID, Bacteria PROTEUS PENNERI (A)  Final      Susceptibility   Proteus penneri - MIC*    AMPICILLIN  >=32 RESISTANT Resistant     CEFEPIME  <=0.12 SENSITIVE Sensitive     CEFTRIAXONE  <=0.25 SENSITIVE Sensitive     CIPROFLOXACIN  <=0.25 SENSITIVE Sensitive     GENTAMICIN  <=1 SENSITIVE Sensitive     IMIPENEM 2 SENSITIVE Sensitive     NITROFURANTOIN  RESISTANT Resistant     TRIMETH /SULFA  <=20 SENSITIVE Sensitive     AMPICILLIN /SULBACTAM 8 SENSITIVE Sensitive     PIP/TAZO <=4 SENSITIVE Sensitive ug/mL    * 60,000 COLONIES/mL PROTEUS PENNERI  Blood Culture (routine x 2)     Status: None   Collection Time: 11/02/23 12:14 PM   Specimen: BLOOD RIGHT ARM  Result Value Ref Range Status   Specimen Description   Final    BLOOD RIGHT ARM Performed at [facility], [address] [place], [place] [address]    Special Requests   Final    BOTTLES DRAWN AEROBIC ONLY Blood Culture results may not be optimal due to an inadequate volume of blood received in culture bottles Performed at [facility], [address] [place], [place] [address]    Culture   Final    NO GROWTH 5 DAYS Performed at [facility], [address], [place], [place] [address]    Report Status 11/07/2023 FINAL  Final  C Difficile Quick Screen w PCR reflex     Status: None   Collection Time: 11/03/23  1:12 PM   Specimen: STOOL  Result Value Ref Range Status   C Diff antigen NEGATIVE NEGATIVE Final   C Diff toxin NEGATIVE NEGATIVE Final   C Diff interpretation No C. difficile detected.  Final    Comment: Performed at [facility], [address] [place], [place] [address]  Gastrointestinal Panel by PCR , Stool     Status: None   Collection Time: 11/04/23  4:19 PM   Specimen: Stool  Result Value Ref Range Status  Campylobacter species NOT DETECTED NOT DETECTED Final   Plesimonas shigelloides NOT DETECTED NOT DETECTED Final   Salmonella species NOT DETECTED NOT DETECTED Final   Yersinia enterocolitica NOT DETECTED NOT DETECTED Final   Vibrio species NOT DETECTED NOT DETECTED Final   Vibrio cholerae NOT DETECTED NOT DETECTED Final   Enteroaggregative E coli (EAEC) NOT DETECTED NOT DETECTED Final   Enteropathogenic E coli (EPEC) NOT DETECTED NOT DETECTED Final   Enterotoxigenic E coli (ETEC) NOT DETECTED NOT DETECTED Final   Shiga like toxin producing E coli (STEC) NOT DETECTED NOT DETECTED Final   Shigella/Enteroinvasive E coli (EIEC) NOT DETECTED NOT DETECTED Final   Cryptosporidium NOT DETECTED NOT DETECTED Final   Cyclospora cayetanensis NOT DETECTED NOT DETECTED Final   Entamoeba histolytica NOT DETECTED NOT DETECTED Final   Giardia lamblia NOT DETECTED NOT DETECTED Final   Adenovirus F40/41 NOT DETECTED NOT DETECTED Final   Astrovirus NOT DETECTED NOT DETECTED Final   Norovirus GI/GII NOT DETECTED NOT DETECTED Final   Rotavirus A NOT DETECTED NOT DETECTED Final   Sapovirus (I, II, IV, and V) NOT DETECTED NOT DETECTED Final    Comment: Performed at  [facility], [address] [place], [place] [address]    Labs: CBC: Recent Labs  Lab 11/02/23 0113 11/03/23 0409 11/05/23 0505 11/06/23 0456 11/07/23 0540  WBC 11.7* 8.7 6.5 7.0 6.9  NEUTROABS 8.2*  --  3.6 4.1 4.1  HGB 12.8 10.5* 8.2* 8.2* 8.2*  HCT 38.5 33.6* 26.4* 26.8* 26.2*  MCV 89.3 98.2 96.4 98.2 98.9  PLT 306 218 204 206 203   Basic Metabolic Panel: Recent Labs  Lab 11/02/23 0113 11/03/23 0409 11/04/23 0444 11/05/23 0505 11/06/23 0456 11/07/23 0540  NA 129* 129* 133* 134* 137 138  K 3.5 4.2 4.7 4.7 4.9 5.1  CL 100 110 115* 117* 121* 120*  CO2 17* 11* 15* 14* 13* 15*  GLUCOSE 97 114* 86 84 78 74  BUN 63* 53* 51* 50* 39* 29*  CREATININE 2.78* 1.82* 2.01* 1.97* 1.55* 1.66*  CALCIUM  8.5* 8.2* 8.0* 7.9* 7.9* 7.9*  MG 2.2  --  1.7  --   --   --    Liver Function Tests: Recent Labs  Lab 11/02/23 0113  AST 13*  ALT 10  ALKPHOS 74  BILITOT 0.7  PROT 7.4  ALBUMIN  3.5   CBG: No results for input(s): GLUCAP in the last 168 hours.  Discharge time spent: greater than 30 minutes.  Signed: Lebron JINNY Cage, MD [facility] 11/07/2023

## 2023-11-07 NOTE — TOC Transition Note (Signed)
 Transition of Care [facility]) - Discharge Note   Patient Details  Name: Cynthia [name] MRN: [id number] Date of Birth: 1961/04/30  Transition [facility] Care [facility]) CM/SW Contact:  [name], LCSW Phone Number: 11/07/2023, 1:20 PM   Clinical Narrative:     CSW notified that patient is medically ready for discharge. Patient will discharge to community to a hotel while long term housing is obtained. No identified transport needs at this time, as to patient is able to obtain self transport. WOC has assisted with coordinating ostomy care needs through [place].  Final next level of care: Home/Self Care Barriers to Discharge: Barriers Resolved   Patient Goals and CMS Choice Patient states their goals for this hospitalization and ongoing recovery are:: Patient to discharge to the community and obtain stable living. Patient currently staying in a hotel until long term housing is obtained. CMS Medicare.gov Compare Post Acute Care list provided to:: Patient Choice offered to / list presented to : NA      Discharge Placement                  Name of family member notified: Patient advised of discharge. Patient and family notified of of transfer: 11/07/23  Discharge Plan and Services Additional resources added to the After Visit Summary for   In-house Referral: Clinical Social Work   Post Acute Care Choice: NA          DME Arranged: N/A DME Agency: NA                  Social Drivers of Health (SDOH) Interventions SDOH Screenings   Food Insecurity: Food Insecurity Present (11/02/2023)  Housing: High Risk (11/02/2023)  Transportation Needs: Unmet Transportation Needs (11/02/2023)  Utilities: Not At Risk (11/02/2023)  Recent Concern: Utilities - At Risk (11/02/2023)  Depression (PHQ2-9): Low Risk  (09/29/2023)  Financial Resource Strain: Medium Risk (03/20/2021)   [facility]  Social Connections: Unknown (03/05/2022)   [facility] from [facility]  Stress: Stress Concern Present (03/20/2021)   Received from [facility], [facility]  Tobacco Use: Medium Risk (11/04/2023)     Readmission Risk Interventions    08/20/2021    1:07 PM 04/09/2021    9:32 AM  Readmission Risk Prevention Plan  Transportation Screening Complete Complete  Medication Review Oceanographer) Complete Complete  PCP or Specialist appointment within 3-5 days of discharge Complete Complete  HRI or Home Care Consult Complete Complete  SW Recovery Care/Counseling Consult Complete Complete  Palliative Care Screening Not Applicable Not Applicable  Skilled Nursing Facility Not Applicable Not Applicable

## 2023-11-07 NOTE — Plan of Care (Signed)
  Problem: Education: Goal: Knowledge of General Education information will improve Description: Including pain rating scale, medication(s)/side effects and non-pharmacologic comfort measures Outcome: Progressing   Problem: Health Behavior/Discharge Planning: Goal: Ability to manage health-related needs will improve Outcome: Progressing   Problem: Clinical Measurements: Goal: Ability to maintain clinical measurements within normal limits will improve Outcome: Progressing Goal: Will remain free from infection Outcome: Progressing Goal: Diagnostic test results will improve Outcome: Progressing Goal: Respiratory complications will improve Outcome: Progressing Goal: Cardiovascular complication will be avoided Outcome: Progressing   Problem: Nutrition: Goal: Adequate nutrition will be maintained Outcome: Progressing   Problem: Coping: Goal: Level of anxiety will decrease Outcome: Progressing   Problem: Elimination: Goal: Will not experience complications related to bowel motility Outcome: Progressing Goal: Will not experience complications related to urinary retention Outcome: Progressing   Problem: Skin Integrity: Goal: Risk for impaired skin integrity will decrease Outcome: Progressing   

## 2023-11-07 NOTE — Plan of Care (Signed)
  Problem: Education: Goal: Knowledge of General Education information will improve Description: Including pain rating scale, medication(s)/side effects and non-pharmacologic comfort measures 11/07/2023 1532 by Pearly Doffing, RN Outcome: Completed/Met 11/07/2023 1105 by Pearly Doffing, RN Outcome: Progressing   Problem: Health Behavior/Discharge Planning: Goal: Ability to manage health-related needs will improve 11/07/2023 1532 by Pearly Doffing, RN Outcome: Completed/Met 11/07/2023 1105 by Pearly Doffing, RN Outcome: Progressing   Problem: Clinical Measurements: Goal: Ability to maintain clinical measurements within normal limits will improve 11/07/2023 1532 by Pearly Doffing, RN Outcome: Completed/Met 11/07/2023 1105 by Pearly Doffing, RN Outcome: Progressing Goal: Will remain free from infection 11/07/2023 1532 by Pearly Doffing, RN Outcome: Completed/Met 11/07/2023 1105 by Pearly Doffing, RN Outcome: Progressing Goal: Diagnostic test results will improve 11/07/2023 1532 by Pearly Doffing, RN Outcome: Completed/Met 11/07/2023 1105 by Pearly Doffing, RN Outcome: Progressing Goal: Respiratory complications will improve 11/07/2023 1532 by Pearly Doffing, RN Outcome: Completed/Met 11/07/2023 1105 by Pearly Doffing, RN Outcome: Progressing Goal: Cardiovascular complication will be avoided 11/07/2023 1532 by Pearly Doffing, RN Outcome: Completed/Met 11/07/2023 1105 by Pearly Doffing, RN Outcome: Progressing   Problem: Activity: Goal: Risk for activity intolerance will decrease 11/07/2023 1532 by Pearly Doffing, RN Outcome: Completed/Met 11/07/2023 1105 by Pearly Doffing, RN Outcome: Progressing

## 2023-11-09 ENCOUNTER — Telehealth: Payer: Self-pay

## 2023-11-09 NOTE — Transitions of Care (Post Inpatient/ED Visit) (Signed)
   11/09/2023  Name: Cynthia [name] MRN: [id number] DOB: 11/10/1960  Today's TOC FU Call Status: Today's TOC FU Call Status:: Unsuccessful Call (1st Attempt) Unsuccessful Call (1st Attempt) Date: 11/09/23  Attempted to reach the patient regarding the most recent Inpatient/ED visit.  Follow Up Plan: Additional outreach attempts will be made to reach the patient to complete the Transitions of Care (Post Inpatient/ED visit) call.   Signature Julian Lemmings, LPN [facility] Nurse Health Advisor Direct Dial [phone]

## 2023-11-09 NOTE — Transitions of Care (Post Inpatient/ED Visit) (Signed)
   11/09/2023  Name: Cynthia [name] MRN: [id number] DOB: October 22, 1961  Today's TOC FU Call Status: Today's TOC FU Call Status:: Successful TOC FU Call Completed Unsuccessful Call (1st Attempt) Date: 11/09/23 [facility] FU Call Complete Date: 11/09/23 Patient's Name and Date of Birth confirmed.  Transition Care Management Follow-up Telephone Call Date of Discharge: 11/07/23 Discharge Facility: [facility]) Type of Discharge: Inpatient Admission Primary Inpatient Discharge Diagnosis:: dehydration How have you been since you were released from the hospital?: Better Any questions or concerns?: No  Items Reviewed: Did you receive and understand the discharge instructions provided?: Yes Medications obtained,verified, and reconciled?: Yes (Medications Reviewed) Any new allergies since your discharge?: No Dietary orders reviewed?: Yes Do you have support at home?: No  Medications Reviewed Today: Medications Reviewed Today     Reviewed by Emmitt Pan, LPN (Licensed Practical Nurse) on 11/09/23 at 1506  Med List Status: <None>   Medication Order Taking? Sig Documenting Provider Last Dose Status Informant  cephALEXin  (KEFLEX ) 500 MG capsule [id number]  Take 1 capsule (500 mg total) by mouth 2 (two) times daily for 7 days. Ezenduka, Nkeiruka J, MD  Active   loperamide  (IMODIUM ) 2 MG capsule [id number] No Take 1 capsule (2 mg total) by mouth 3 (three) times daily as needed for diarrhea or loose stools.  Patient not taking: Reported on 10/20/2023   Sonjia Held, MD Not Taking Active Self, Pharmacy Records  Octreotide  Acetate 50 MCG/ML SOSY [id number] No Inject 1 ml (50mcg) into the skin every 8 hours. Joyce Norleen BROCKS, MD 11/01/2023 Bedtime Active Self, Pharmacy Records  senna-docusate (SENOKOT-S) 8.6-50 MG tablet [id number]  Take 1 tablet by mouth 2 (two) times daily as needed for mild constipation. Ezenduka, Nkeiruka J, MD  Active   sodium bicarbonate  650 MG tablet [id number]  Take 1 tablet  (650 mg total) by mouth 3 (three) times daily for 2 days. Ezenduka, Nkeiruka J, MD  Active   STELARA 90 MG/ML SOSY injection [id number] No Inject 90 mg into the skin every 3 (three) months.  Patient not taking: Reported on 10/16/2023   [provider] Not Taking Active Self, Pharmacy Records           Med Note ALLEGRA, [facility]   Sat Oct 10, 2023 12:48 AM) On Hold.  SYRINGE-NEEDLE, DISP, 3 ML 25G X 5/8 3 ML MISC [id number] No Use as directed for octreotide  injection  Patient not taking: Reported on 10/20/2023   Jerri Keys, MD Not Taking Active Self, Pharmacy Records            Home Care and Equipment/Supplies: Were Home Health Services Ordered?: NA Any new equipment or medical supplies ordered?: NA  Functional Questionnaire: Do you need assistance with bathing/showering or dressing?: No Do you need assistance with meal preparation?: No Do you need assistance with eating?: No Do you have difficulty maintaining continence: No Do you need assistance with getting out of bed/getting out of a chair/moving?: No Do you have difficulty managing or taking your medications?: No  Follow up appointments reviewed: PCP Follow-up appointment confirmed?: No (declined, has surgery this week) MD Provider Line Number:[phone] Given: No Specialist Hospital Follow-up appointment confirmed?: Yes Date of Specialist follow-up appointment?: 11/12/23 Follow-Up Specialty Provider:: surgeon Do you need transportation to your follow-up appointment?: No Do you understand care options if your condition(s) worsen?: Yes-patient verbalized understanding    SIGNATURE Pan Emmitt, LPN [facility] Nurse Health Advisor Direct Dial [phone]

## 2023-11-10 ENCOUNTER — Encounter (HOSPITAL_COMMUNITY)
Admission: RE | Admit: 2023-11-10 | Discharge: 2023-11-10 | Disposition: A | Discharge status: Home / Self Care | Payer: Medicare Other | Source: Ambulatory Visit | Attending: Urology | Admitting: Urology

## 2023-11-10 ENCOUNTER — Other Ambulatory Visit: Payer: Self-pay

## 2023-11-10 DIAGNOSIS — I700 Atherosclerosis of aorta: Secondary | ICD-10-CM | POA: Diagnosis not present

## 2023-11-10 DIAGNOSIS — F32A Depression, unspecified: Secondary | ICD-10-CM | POA: Insufficient documentation

## 2023-11-10 DIAGNOSIS — Z599 Problem related to housing and economic circumstances, unspecified: Secondary | ICD-10-CM | POA: Diagnosis not present

## 2023-11-10 DIAGNOSIS — R011 Cardiac murmur, unspecified: Secondary | ICD-10-CM | POA: Diagnosis not present

## 2023-11-10 DIAGNOSIS — Z932 Ileostomy status: Secondary | ICD-10-CM | POA: Insufficient documentation

## 2023-11-10 DIAGNOSIS — K5090 Crohn's disease, unspecified, without complications: Secondary | ICD-10-CM | POA: Insufficient documentation

## 2023-11-10 DIAGNOSIS — K219 Gastro-esophageal reflux disease without esophagitis: Secondary | ICD-10-CM | POA: Diagnosis not present

## 2023-11-10 DIAGNOSIS — G2581 Restless legs syndrome: Secondary | ICD-10-CM | POA: Diagnosis not present

## 2023-11-10 DIAGNOSIS — N200 Calculus of kidney: Secondary | ICD-10-CM | POA: Diagnosis not present

## 2023-11-10 DIAGNOSIS — Z9049 Acquired absence of other specified parts of digestive tract: Secondary | ICD-10-CM | POA: Diagnosis not present

## 2023-11-10 DIAGNOSIS — Z01812 Encounter for preprocedural laboratory examination: Secondary | ICD-10-CM | POA: Insufficient documentation

## 2023-11-10 DIAGNOSIS — I129 Hypertensive chronic kidney disease with stage 1 through stage 4 chronic kidney disease, or unspecified chronic kidney disease: Secondary | ICD-10-CM | POA: Diagnosis not present

## 2023-11-10 DIAGNOSIS — N183 Chronic kidney disease, stage 3 unspecified: Secondary | ICD-10-CM | POA: Diagnosis not present

## 2023-11-10 DIAGNOSIS — F419 Anxiety disorder, unspecified: Secondary | ICD-10-CM | POA: Diagnosis not present

## 2023-11-10 HISTORY — DX: Failed or difficult intubation, initial encounter: T88.4XXA

## 2023-11-10 LAB — CBC
HCT: 31.3 % — ABNORMAL LOW (ref 36.0–46.0)
Hemoglobin: 9.8 g/dL — ABNORMAL LOW (ref 12.0–15.0)
MCH: 30.3 pg (ref 26.0–34.0)
MCHC: 31.3 g/dL (ref 30.0–36.0)
MCV: 96.9 fL (ref 80.0–100.0)
Platelets: 283 10*3/uL (ref 150–400)
RBC: 3.23 MIL/uL — ABNORMAL LOW (ref 3.87–5.11)
RDW: 14.5 % (ref 11.5–15.5)
WBC: 11 10*3/uL — ABNORMAL HIGH (ref 4.0–10.5)
nRBC: 0 % (ref 0.0–0.2)

## 2023-11-10 LAB — BASIC METABOLIC PANEL
Anion gap: 7 (ref 5–15)
BUN: 20 mg/dL (ref 8–23)
CO2: 18 mmol/L — ABNORMAL LOW (ref 22–32)
Calcium: 8.3 mg/dL — ABNORMAL LOW (ref 8.9–10.3)
Chloride: 112 mmol/L — ABNORMAL HIGH (ref 98–111)
Creatinine, Ser: 1.62 mg/dL — ABNORMAL HIGH (ref 0.44–1.00)
GFR, Estimated: 35 mL/min — ABNORMAL LOW (ref >60)
Glucose, Bld: 66 mg/dL — ABNORMAL LOW (ref 70–99)
Potassium: 4.4 mmol/L (ref 3.5–5.1)
Sodium: 137 mmol/L (ref 135–145)

## 2023-11-10 LAB — PROTIME-INR
INR: 1.1 (ref 0.8–1.2)
Prothrombin Time: 14.7 s (ref 11.4–15.2)

## 2023-11-10 NOTE — Progress Notes (Signed)
 Please review pre op CBC, BMP done on 11/10/23

## 2023-11-10 NOTE — Progress Notes (Addendum)
 COVID Vaccine received:  []  No [x]  Yes Date of any COVID positive Test in last 90 days: no PCP - Norleen Jobs MD Cardiologist - no  Chest x-ray -  EKG -  11/02/23 Epic Stress Test -  ECHO - 01/25/21 Epic Cardiac Cath -   Bowel Prep - [x]  No  []   Yes ______  Pacemaker / ICD device [x]  No []  Yes   Spinal Cord Stimulator:[x]  No []  Yes       History of Sleep Apnea? [x]  No []  Yes   CPAP used?- [x]  No []  Yes    Does the patient monitor blood sugar?          [x]  No []  Yes  []  N/A  Patient has: [x]  NO Hx DM   []  Pre-DM                 []  DM1  []   DM2 Does patient have a [employer] or Dexacom? []  No []  Yes   Fasting Blood Sugar Ranges-  Checks Blood Sugar _____ times a day  GLP1 agonist / usual dose - no GLP1 instructions:  SGLT-2 inhibitors / usual dose - no SGLT-2 instructions:   Blood Thinner / Instructions:no Aspirin Instructions:no  Comments:   Activity level: Patient is able to climb a flight of stairs without difficulty; [x]  No CP  [x]  No SOB, but would have ___   Patient can  perform ADLs without assistance.   Anesthesia review: Recent hospitalization 12/26-12/29., Crohn's, Ileostomy, colectomy  Patient denies shortness of breath, fever, cough and chest pain at PAT appointment.  Patient verbalized understanding and agreement to the Pre-Surgical Instructions that were given to them at this PAT appointment. Patient was also educated of the need to review these PAT instructions again prior to his/her surgery.I reviewed the appropriate phone numbers to call if they have any and questions or concerns.

## 2023-11-10 NOTE — Patient Instructions (Signed)
 SURGICAL WAITING ROOM VISITATION  Patients having surgery or a procedure may have no more than 2 support people in the waiting area - these visitors may rotate.    Children under the age of 24 must have an adult with them who is not the patient.  Due to an increase in RSV and influenza rates and associated hospitalizations, children ages 25 and under may not visit patients in [facility] hospitals.  If the patient needs to stay at the hospital during part of their recovery, the visitor guidelines for inpatient rooms apply. Pre-op nurse will coordinate an appropriate time for 1 support person to accompany patient in pre-op.  This support person may not rotate.    Please refer to the [facility] website for the visitor guidelines for Inpatients (after your surgery is over and you are in a regular room).       Your procedure is scheduled on: 11/12/23   Report to [facility] Main Entrance    Report to admitting at  10:15 AM   Call this number if you have problems the morning of surgery [phone]   Do not eat food or drink liquids:After Midnight.     FOLLOW BOWEL PREP AND ANY ADDITIONAL PRE OP INSTRUCTIONS YOU RECEIVED FROM YOUR SURGEON'S OFFICE!!!     Oral Hygiene is also important to reduce your risk of infection.                                    Remember - BRUSH YOUR TEETH THE MORNING OF SURGERY WITH YOUR REGULAR TOOTHPASTE   Stop all vitamins and herbal supplements 7 days before surgery.   Take these medicines the morning of surgery with A SIP OF WATER : Octreotide  Acetate             You may not have any metal on your body including hair pins, jewelry, and body piercing             Do not wear make-up, lotions, powders, perfumes/cologne, or deodorant  Do not wear nail polish including gel and S&S, artificial/acrylic nails, or any other type of covering on natural nails including finger and toenails. If you have artificial nails, gel coating, etc. that needs to be  removed by a nail salon please have this removed prior to surgery or surgery may need to be canceled/ delayed if the surgeon/ anesthesia feels like they are unable to be safely monitored.   Do not shave  48 hours prior to surgery.    Do not bring valuables to the hospital. [facility] IS NOT             RESPONSIBLE   FOR VALUABLES.   Contacts, glasses, dentures or bridgework may not be worn into surgery.   Bring small overnight bag day of surgery.   DO NOT BRING YOUR HOME MEDICATIONS TO THE HOSPITAL. PHARMACY WILL DISPENSE MEDICATIONS LISTED ON YOUR MEDICATION LIST TO YOU DURING YOUR ADMISSION IN THE HOSPITAL!    Patients discharged on the day of surgery will not be allowed to drive home.  Someone NEEDS to stay with you for the first 24 hours after anesthesia.   Special Instructions: Bring a copy of your healthcare power of attorney and living will documents the day of surgery if you haven't scanned them before.              Please read over the following fact  sheets you were given: IF YOU HAVE QUESTIONS ABOUT YOUR PRE-OP INSTRUCTIONS PLEASE CALL [phone] [place]   If you received a COVID test during your pre-op visit  it is requested that you wear a mask when out in public, stay away from anyone that may not be feeling well and notify your surgeon if you develop symptoms. If you test positive for Covid or have been in contact with anyone that has tested positive in the last 10 days please notify you surgeon.    [facility] - Preparing for Surgery Before surgery, you can play an important role.  Because skin is not sterile, your skin needs to be as free of germs as possible.  You can reduce the number of germs on your skin by washing with CHG (chlorahexidine gluconate) soap before surgery.  CHG is an antiseptic cleaner which kills germs and bonds with the skin to continue killing germs even after washing. Please DO NOT use if you have an allergy to CHG or antibacterial soaps.  If your skin  becomes reddened/irritated stop using the CHG and inform your nurse when you arrive at Short Stay. Do not shave (including legs and underarms) for at least 48 hours prior to the first CHG shower.  You may shave your face/neck.  Please follow these instructions carefully:  1.  Shower with CHG Soap the night before surgery and the  morning of surgery.  2.  If you choose to wash your hair, wash your hair first as usual with your normal  shampoo.  3.  After you shampoo, rinse your hair and body thoroughly to remove the shampoo.                             4.  Use CHG as you would any other liquid soap.  You can apply chg directly to the skin and wash.  Gently with a scrungie or clean washcloth.  5.  Apply the CHG Soap to your body ONLY FROM THE NECK DOWN.   Do   not use on face/ open                           Wound or open sores. Avoid contact with eyes, ears mouth and   genitals (private parts).                       Wash face,  Genitals (private parts) with your normal soap.             6.  Wash thoroughly, paying special attention to the area where your    surgery  will be performed.  7.  Thoroughly rinse your body with warm water  from the neck down.  8.  DO NOT shower/wash with your normal soap after using and rinsing off the CHG Soap.                9.  Pat yourself dry with a clean towel.            10.  Wear clean pajamas.            11.  Place clean sheets on your bed the night of your first shower and do not  sleep with pets. Day of Surgery : Do not apply any lotions/deodorants the morning of surgery.  Please wear clean clothes to the hospital/surgery center.  FAILURE TO FOLLOW THESE INSTRUCTIONS  MAY RESULT IN THE CANCELLATION OF YOUR SURGERY  PATIENT SIGNATURE_________________________________  NURSE SIGNATURE__________________________________  ________________________________________________________________________

## 2023-11-11 ENCOUNTER — Telehealth: Payer: Self-pay | Admitting: Family Medicine

## 2023-11-11 ENCOUNTER — Encounter (HOSPITAL_COMMUNITY): Payer: Self-pay

## 2023-11-11 NOTE — Telephone Encounter (Signed)
 Pt had left message so I called her back state she needs refill on her  octreotide  (SANDOSTATIN ) injection 100 mcg    Ordered Dose: 100 mcg Route: Subcutaneous Frequency: Every 8 hours       She says she is out of med and she is drowning without it, says it solidifies her poop, she says you have filled it for her in the past & I checked & it looks like you did refill it before.  She wasn't sure who filled it last, she is having surgery tomorrow.  She was supposed to have the bag reversal surgery but ended up with a very large kidney stone so that surgery had to be postponed for the kidney stone surgery.  So in the meantime asked that you please refill her Sandostatin  to [facility] 100mcg injections TID

## 2023-11-11 NOTE — Anesthesia Preprocedure Evaluation (Addendum)
 Anesthesia Evaluation  Patient identified by MRN, date of birth, ID band Patient awake    Reviewed: Allergy & Precautions, NPO status , Patient's Chart, lab work & pertinent test results  History of Anesthesia Complications (+) DIFFICULT AIRWAY and history of anesthetic complications  Airway Mallampati: II  TM Distance: >3 FB Neck ROM: Full    Dental no notable dental hx. (+) Teeth Intact, Dental Advisory Given   Pulmonary former smoker   Pulmonary exam normal breath sounds clear to auscultation       Cardiovascular hypertension, Normal cardiovascular exam Rhythm:Regular Rate:Normal     Neuro/Psych  Headaches  Anxiety Depression       GI/Hepatic hiatal hernia,GERD  ,,Crohns dz   Endo/Other    Renal/GU CRFRenal disease     Musculoskeletal   Abdominal   Peds  Hematology   Anesthesia Other Findings Ace, nsaids  Reproductive/Obstetrics                             Anesthesia Physical Anesthesia Plan  ASA: 3  Anesthesia Plan: General   Post-op Pain Management:    Induction: Intravenous  PONV Risk Score and Plan: Treatment may vary due to age or medical condition, Midazolam , Ondansetron  and Dexamethasone   Airway Management Planned: Oral ETT and Video Laryngoscope Planned  Additional Equipment: None  Intra-op Plan:   Post-operative Plan: Extubation in OR  Informed Consent: I have reviewed the patients History and Physical, chart, labs and discussed the procedure including the risks, benefits and alternatives for the proposed anesthesia with the patient or authorized representative who has indicated his/her understanding and acceptance.     Dental advisory given  Plan Discussed with:   Anesthesia Plan Comments: (See PAT note from 1/7 by MARLA Senna PA-C )        Anesthesia Quick Evaluation

## 2023-11-11 NOTE — Progress Notes (Signed)
 DISCUSSION: Cynthia [name] is a 63 yo female who presents to PAT prior to LEFT NEPHROLITHOTOMY PERCUTANEOUS on 11/12/23 with Dr. Shane for left renal stones. PMH of former smoking, HTN, aortic atherosclerosis (by CT scan), heart murmur, migraines, RLS, GERD, hiatal hernia, Crohn's disease s/p ileo and sigmoid colectomy with ileostomy/mucous fistula creation, CKD (baseline 1.5-2), anxiety, depression.  Prior complications from anesthesia includes difficult airway noted due to limited oral opening during hysterectomy in 2012 and GU procedure in 2013.  Patient admitted from 12/6-12/12 due to urosepsis and AKI on CKD secondary to obstructing 2.5 cm staghorn calculus at the left UPJ with moderate to severe left-sided hydronephrosis.  Patient was seen by urology on admission and underwent left-sided percutaneous nephrostomy tube placement on 10/10/2023 by IR. At discharge symptoms were resolving and AKI improved.  She was readmitted from 12/30-1/4 due to another AKI and UTI, metabolic acidosis, and housing instability. She was treated and improved on discharge.   She follows with GI at [facility] for Crohn's disease. Was last seen on 09/02/23 and referred to General surgery for ileostomy reversal but was not done due to acute issues from the renal stone. No recent flares. Stable on Stelara and Octreotide .  VS: BP 128/80   Pulse 80   Temp 36.4 C (Oral)   Resp 18   Ht 5' 4 (1.626 m)   Wt 59.4 kg   LMP 06/10/2011 (Exact Date)   SpO2 100%   BMI 22.49 kg/m   PROVIDERS: Joyce Norleen BROCKS, MD GI: Helene Headland, MD ([facility])   LABS:  CKD and anemia at baseline. Urine culture positive - results routed to Dr. Shane. (all labs ordered are listed, but only abnormal results are displayed)  Labs Reviewed  URINE CULTURE - Abnormal; Notable for the following components:      Result Value   Culture   (*)    Value: >=100,000 COLONIES/mL PSEUDOMONAS AERUGINOSA SUSCEPTIBILITIES TO FOLLOW Performed at  [facility], [address], [place], [place] [address]    All other components within normal limits  CBC - Abnormal; Notable for the following components:   WBC 11.0 (*)    RBC 3.23 (*)    Hemoglobin 9.8 (*)    HCT 31.3 (*)    All other components within normal limits  BASIC METABOLIC PANEL - Abnormal; Notable for the following components:   Chloride 112 (*)    CO2 18 (*)    Glucose, Bld 66 (*)    Creatinine, Ser 1.62 (*)    Calcium  8.3 (*)    GFR, Estimated 35 (*)    All other components within normal limits  PROTIME-INR     IMAGES:  CT abdomen/pelvis 11/02/23:  IMPRESSION: 1. No acute findings in the abdomen or pelvis. Specifically, no findings to explain the patient's history of abdominal pain. 2. Stable postop changes involving small bowel and colon. No small bowel wall thickening. No mesenteric edema or congestion. No bowel obstruction. 3. Left percutaneous nephrostomy tube again noted with large stone in the central left renal pelvis. No evidence for hydroureter. 4. Cholelithiasis. 5.  Aortic Atherosclerosis (ICD10-I70.0).  EKG 11/02/23 (done during ED visit)  Sinus tachycardia, rate 112 Biatrial enlargement Abnormal R-wave progression, late transition Probable left ventricular hypertrophy Inferior infarct, old  CV:  Echo 01/25/2021:  IMPRESSIONS    1. Left ventricular ejection fraction, by estimation, is 60 to 65%. The left ventricle has normal function. The left ventricle has no regional wall motion abnormalities. Left ventricular diastolic parameters are  consistent with Grade I diastolic dysfunction (impaired relaxation).  2. Right ventricular systolic function is normal. The right ventricular size is normal.  3. The mitral valve is normal in structure. No evidence of mitral valve regurgitation. No evidence of mitral stenosis.  4. The aortic valve is normal in structure. Aortic valve regurgitation is not visualized. No aortic stenosis is  present.  5. The inferior vena cava is normal in size with greater than 50% respiratory variability, suggesting right atrial pressure of 3 mmHg.  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   GERD (gastroesophageal reflux disease)    Headache(784.0)    MIGRAINE   Heart murmur    Hiatal hernia    History of noncompliance with medical treatment 04/13/2022   History of rectal polyp 2020 01/12/2021   History of serrated colonic polyp 01/13/2011   Hypertension    Incontinence    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HCC)    MVP (mitral valve prolapse)    RLS (restless legs syndrome) 09/25/2014   Sepsis due to cellulitis (HCC) 04/05/2021    Past Surgical History:  Procedure Laterality Date   ABDOMINAL HYSTERECTOMY  06/17/2011   Procedure: HYSTERECTOMY ABDOMINAL;  Surgeon: Bobie DELENA Cary;  Location: [facility];  Service: Gynecology;  Laterality: N/A;  Converted to Abdominal Hysterectomy with lysis of adhesions    ANTERIOR AND POSTERIOR REPAIR  02/25/2012   Procedure: ANTERIOR (CYSTOCELE) AND POSTERIOR REPAIR (RECTOCELE);  Surgeon: Rosaline DELENA Luna, MD;  Location: [facility];  Service: Gynecology;  Laterality: N/A;  ANterior cystocele repair ONLY   BIOPSY  08/29/2019   Procedure: BIOPSY;  Surgeon: San Sandor GAILS, DO;  [facility]: [facility];  Service: Gastroenterology;;   BLADDER SUSPENSION  02/25/2012   Procedure: TRANSVAGINAL TAPE (TVT) PROCEDURE;  Surgeon: Rosaline DELENA Luna, MD;  Location: [facility];  Service: Gynecology;  Laterality: N/A;   COLECTOMY WITH COLOSTOMY CREATION/HARTMANN PROCEDURE  02/19/2021   Sigmoid colectomy w descnding colon  MF/ostomy   COLONOSCOPY WITH PROPOFOL  N/A 08/29/2019   Procedure: COLONOSCOPY WITH PROPOFOL ;  Surgeon: San Sandor GAILS, DO;  [facility]: [facility];  Service: Gastroenterology;  Laterality: N/A;   CYSTOSCOPY  02/25/2012   Procedure: CYSTOSCOPY;  Surgeon: Rosaline DELENA Luna, MD;  [facility]: [facility];  Service: Gynecology;  Laterality: N/A;   ILEOCECETOMY  02/19/2021   Dr Ladena @ [facility]   ILEOSTOMY  02/19/2021   [facility] - Dr Ladena   IR NEPHROSTOMY PLACEMENT LEFT  10/10/2023   KNEE ARTHROSCOPY  2010   LAPAROSCOPIC ASSISTED VAGINAL HYSTERECTOMY  06/17/2011   Procedure: LAPAROSCOPIC ASSISTED VAGINAL HYSTERECTOMY;  Surgeon: Bobie DELENA Cary;  Location: [facility];  Service: Gynecology;  Laterality: N/A;  Attempted    MUCUS FISTULA - RECTOSIGMOID  02/19/2021   POLYPECTOMY  08/29/2019   Procedure: POLYPECTOMY;  Surgeon: San Sandor GAILS, DO;  [facility]: [facility];  Service: Gastroenterology;;   SALPINGOOPHORECTOMY  06/17/2011   Procedure: SALPINGO OOPHERECTOMY;  Surgeon: Bobie DELENA Cary;  Location: [facility];  Service: Gynecology;  Laterality: Bilateral;   TONSILLECTOMY      MEDICATIONS:  cephALEXin  (KEFLEX ) 500 MG capsule   loperamide  (IMODIUM ) 2 MG capsule  Octreotide  Acetate 50 MCG/ML SOSY   senna-docusate (SENOKOT-S) 8.6-50 MG tablet   STELARA 90 MG/ML SOSY injection   SYRINGE-NEEDLE, DISP, 3 ML 25G X 5/8 3 ML MISC   No current facility-administered medications for this encounter.   Cynthia [name] [facility] Surgical Short Stay/Anesthesiology [facility] Phone [phone] 11/11/2023 10:02 AM

## 2023-11-12 ENCOUNTER — Inpatient Hospital Stay (HOSPITAL_COMMUNITY)
Admission: RE | Admit: 2023-11-12 | Discharge: 2023-11-17 | DRG: 660 | Disposition: A | Discharge status: Home / Self Care | Payer: Medicare Other | Attending: Urology | Admitting: Urology

## 2023-11-12 DIAGNOSIS — Z860101 Personal history of adenomatous and serrated colon polyps: Secondary | ICD-10-CM

## 2023-11-12 DIAGNOSIS — Z87442 Personal history of urinary calculi: Secondary | ICD-10-CM | POA: Diagnosis not present

## 2023-11-12 DIAGNOSIS — N202 Calculus of kidney with calculus of ureter: Secondary | ICD-10-CM | POA: Diagnosis not present

## 2023-11-12 DIAGNOSIS — G2581 Restless legs syndrome: Secondary | ICD-10-CM | POA: Diagnosis not present

## 2023-11-12 DIAGNOSIS — N179 Acute kidney failure, unspecified: Secondary | ICD-10-CM | POA: Diagnosis present

## 2023-11-12 DIAGNOSIS — B965 Pseudomonas (aeruginosa) (mallei) (pseudomallei) as the cause of diseases classified elsewhere: Secondary | ICD-10-CM | POA: Diagnosis present

## 2023-11-12 DIAGNOSIS — Z87440 Personal history of urinary (tract) infections: Secondary | ICD-10-CM

## 2023-11-12 DIAGNOSIS — Z888 Allergy status to other drugs, medicaments and biological substances status: Secondary | ICD-10-CM

## 2023-11-12 DIAGNOSIS — N200 Calculus of kidney: Principal | ICD-10-CM

## 2023-11-12 DIAGNOSIS — Z90710 Acquired absence of both cervix and uterus: Secondary | ICD-10-CM

## 2023-11-12 DIAGNOSIS — Z832 Family history of diseases of the blood and blood-forming organs and certain disorders involving the immune mechanism: Secondary | ICD-10-CM | POA: Diagnosis not present

## 2023-11-12 DIAGNOSIS — I10 Essential (primary) hypertension: Secondary | ICD-10-CM | POA: Diagnosis not present

## 2023-11-12 DIAGNOSIS — K219 Gastro-esophageal reflux disease without esophagitis: Secondary | ICD-10-CM | POA: Diagnosis not present

## 2023-11-12 DIAGNOSIS — Z936 Other artificial openings of urinary tract status: Secondary | ICD-10-CM | POA: Diagnosis not present

## 2023-11-12 DIAGNOSIS — F419 Anxiety disorder, unspecified: Secondary | ICD-10-CM | POA: Diagnosis present

## 2023-11-12 DIAGNOSIS — K5000 Crohn's disease of small intestine without complications: Secondary | ICD-10-CM | POA: Diagnosis present

## 2023-11-12 DIAGNOSIS — Z87891 Personal history of nicotine dependence: Secondary | ICD-10-CM | POA: Diagnosis not present

## 2023-11-12 DIAGNOSIS — Z9079 Acquired absence of other genital organ(s): Secondary | ICD-10-CM

## 2023-11-12 DIAGNOSIS — Z833 Family history of diabetes mellitus: Secondary | ICD-10-CM

## 2023-11-12 DIAGNOSIS — I129 Hypertensive chronic kidney disease with stage 1 through stage 4 chronic kidney disease, or unspecified chronic kidney disease: Secondary | ICD-10-CM | POA: Diagnosis present

## 2023-11-12 DIAGNOSIS — E875 Hyperkalemia: Secondary | ICD-10-CM | POA: Diagnosis not present

## 2023-11-12 DIAGNOSIS — Z90722 Acquired absence of ovaries, bilateral: Secondary | ICD-10-CM

## 2023-11-12 DIAGNOSIS — N183 Chronic kidney disease, stage 3 unspecified: Secondary | ICD-10-CM | POA: Diagnosis present

## 2023-11-12 DIAGNOSIS — Z79899 Other long term (current) drug therapy: Secondary | ICD-10-CM

## 2023-11-12 DIAGNOSIS — F32A Depression, unspecified: Secondary | ICD-10-CM | POA: Diagnosis present

## 2023-11-12 LAB — URINE CULTURE: Culture: >=100000 — AB

## 2023-11-12 LAB — HGB A1C W/O EAG: Hgb A1c MFr Bld: 5.7 % — ABNORMAL HIGH (ref 4.8–5.6)

## 2023-11-12 LAB — SPECIMEN STATUS REPORT

## 2023-11-12 MED ORDER — OCTREOTIDE ACETATE 50 MCG/ML ~~LOC~~ SOSY: 50.0000 ug | PREFILLED_SYRINGE | SUBCUTANEOUS | 5 refills | Status: DC

## 2023-11-12 MED ORDER — HYOSCYAMINE SULFATE 0.125 MG SL SUBL: 0.1250 mg | SUBLINGUAL_TABLET | SUBLINGUAL | Status: DC | PRN

## 2023-11-12 MED ORDER — GENTAMICIN SULFATE 40 MG/ML IJ SOLN
5.0000 mg/kg | INTRAVENOUS | Status: DC
  Filled 2023-11-12: qty 7.5

## 2023-11-12 MED ORDER — ORAL CARE MOUTH RINSE: 15.0000 mL | Freq: Once | OROMUCOSAL | Status: AC

## 2023-11-12 MED ORDER — OXYCODONE HCL 5 MG PO TABS
5.0000 mg | ORAL_TABLET | ORAL | Status: DC | PRN
  Administered 2023-11-14 – 2023-11-17 (×10): 5 mg via ORAL
  Filled 2023-11-12 (×10): qty 1

## 2023-11-12 MED ORDER — CHLORHEXIDINE GLUCONATE 0.12 % MT SOLN
15.0000 mL | Freq: Once | OROMUCOSAL | Status: AC
  Administered 2023-11-12: 15 mL via OROMUCOSAL
  Filled 2023-11-12: qty 15

## 2023-11-12 MED ORDER — LACTATED RINGERS IV SOLN: INTRAVENOUS | Status: DC

## 2023-11-12 MED ORDER — DIPHENHYDRAMINE HCL 50 MG/ML IJ SOLN
12.5000 mg | Freq: Four times a day (QID) | INTRAMUSCULAR | Status: DC | PRN
Start: 2023-11-12 — End: 2023-11-17

## 2023-11-12 MED ORDER — ACETAMINOPHEN 325 MG PO TABS
650.0000 mg | ORAL_TABLET | ORAL | Status: DC | PRN
  Administered 2023-11-14 – 2023-11-15 (×3): 650 mg via ORAL
  Filled 2023-11-12 (×3): qty 2

## 2023-11-12 MED ORDER — SODIUM CHLORIDE 0.9 % IV SOLN
1.0000 g | Freq: Two times a day (BID) | INTRAVENOUS | Status: DC
  Administered 2023-11-12 – 2023-11-15 (×6): 1 g via INTRAVENOUS
  Filled 2023-11-12 (×6): qty 10

## 2023-11-12 MED ORDER — DIPHENHYDRAMINE HCL 12.5 MG/5ML PO ELIX: 12.5000 mg | ORAL_SOLUTION | Freq: Four times a day (QID) | ORAL | Status: DC | PRN

## 2023-11-12 MED ORDER — SODIUM BICARBONATE 650 MG PO TABS
650.0000 mg | ORAL_TABLET | Freq: Two times a day (BID) | ORAL | Status: DC
  Administered 2023-11-12 – 2023-11-17 (×9): 650 mg via ORAL
  Filled 2023-11-12 (×9): qty 1

## 2023-11-12 MED ORDER — ONDANSETRON HCL 4 MG/2ML IJ SOLN
4.0000 mg | INTRAMUSCULAR | Status: DC | PRN
  Administered 2023-11-14: 4 mg via INTRAVENOUS
  Filled 2023-11-12: qty 2

## 2023-11-12 MED ORDER — OCTREOTIDE ACETATE 50 MCG/ML IJ SOLN
50.0000 ug | Freq: Three times a day (TID) | INTRAMUSCULAR | Status: DC
  Administered 2023-11-12 – 2023-11-17 (×13): 50 ug via SUBCUTANEOUS
  Filled 2023-11-12 (×15): qty 1

## 2023-11-12 MED ORDER — SODIUM CHLORIDE 0.9 % IV SOLN
1.0000 g | INTRAVENOUS | Status: AC
  Administered 2023-11-13: 1 g via INTRAVENOUS
  Filled 2023-11-12 (×2): qty 1000

## 2023-11-12 NOTE — H&P (Addendum)
 H&P Chief Complaint: Left renal pelvis stone   History of Present Illness: 63 year old female with a past history of Crohn's disease, recurrent UTIs, CKD who initially presented on 10/10/2023 for left-sided pyelo as well as large renal pelvis stone.  Patient's status post IR nephrostomy tube placement on 10/10/2023.  She was admitted today after preop urine culture demonstrated Pseudomonas pansensitive, culture resulted on 11/11/2023.  Plan is for 24 hours of IV antibiotics with PCNL scheduled for tomorrow.  Patient was admitted late December for urinary tract infection and high ostomy output she has since been on Keflex .  Patient's current only home medications are sodium bicarb and octreotide .  Surgical history: Includes hysterectomy and then ileostomy.  Currently patient denies any pain but is having high ostomy output as she has not been on her octreotide  for the past several days.  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   GERD (gastroesophageal reflux disease)    Headache(784.0)    MIGRAINE   Heart murmur    Hiatal hernia    History of noncompliance with medical treatment 04/13/2022   History of rectal polyp 2020 01/12/2021   History of serrated colonic polyp 01/13/2011   Hypertension    Incontinence    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HCC)    MVP (mitral valve prolapse)    RLS (restless legs syndrome) 09/25/2014   Sepsis due to cellulitis (HCC) 04/05/2021   Past Surgical History:  Procedure Laterality Date   ABDOMINAL HYSTERECTOMY  06/17/2011   Procedure: HYSTERECTOMY ABDOMINAL;  Surgeon: Bobie DELENA Cary;  [facility];  Service: Gynecology;  Laterality: N/A;  Converted to Abdominal Hysterectomy with lysis of adhesions    ANTERIOR AND POSTERIOR REPAIR  02/25/2012   Procedure: ANTERIOR (CYSTOCELE) AND POSTERIOR REPAIR (RECTOCELE);  Surgeon: Rosaline DELENA Luna, MD;  [facility]: [facility];  Service: Gynecology;  Laterality: N/A;  ANterior cystocele repair ONLY   BIOPSY  08/29/2019   Procedure: BIOPSY;  Surgeon: San Sandor GAILS, DO;  [facility];  Service: Gastroenterology;;   BLADDER SUSPENSION  02/25/2012   Procedure: TRANSVAGINAL TAPE (TVT) PROCEDURE;  Surgeon: Rosaline DELENA Luna, MD;  Location: [facility];  Service: Gynecology;  Laterality: N/A;   COLECTOMY WITH COLOSTOMY CREATION/HARTMANN PROCEDURE  02/19/2021   Sigmoid colectomy w descnding colon MF/ostomy   COLONOSCOPY WITH PROPOFOL  N/A 08/29/2019   Procedure: COLONOSCOPY WITH PROPOFOL ;  Surgeon: San Sandor GAILS, DO;  [facility];  Service: Gastroenterology;  Laterality: N/A;   CYSTOSCOPY  02/25/2012   Procedure: CYSTOSCOPY;  Surgeon: Rosaline DELENA Luna, MD;  [facility];  Service: Gynecology;  Laterality: N/A;   ILEOCECETOMY  02/19/2021   Dr Ladena @ [facility]   ILEOSTOMY  02/19/2021   [facility] - Dr Ladena   IR NEPHROSTOMY PLACEMENT LEFT  10/10/2023   KNEE ARTHROSCOPY  2010   LAPAROSCOPIC ASSISTED VAGINAL HYSTERECTOMY  06/17/2011   Procedure: LAPAROSCOPIC ASSISTED VAGINAL HYSTERECTOMY;  Surgeon: Bobie DELENA Cary;  [facility];  Service: Gynecology;  Laterality: N/A;  Attempted    MUCUS FISTULA - RECTOSIGMOID  02/19/2021   POLYPECTOMY  08/29/2019   Procedure: POLYPECTOMY;  Surgeon: San Sandor GAILS, DO;  Location: [facility];  Service: Gastroenterology;;   SALPINGOOPHORECTOMY  06/17/2011   Procedure: SALPINGO OOPHERECTOMY;  Surgeon: Bobie DELENA Cary;  Location: [facility];  Service: Gynecology;  Laterality: Bilateral;   TONSILLECTOMY      Home Medications:  Medications Prior to Admission  Medication Sig  Dispense Refill Last Dose/Taking   Octreotide  Acetate 50 MCG/ML SOSY Inject 1 ml (50mcg) into the skin every 8 hours. 85 mL 5 Past Week   sodium bicarbonate  650 MG tablet Take 650 mg by mouth 2 (two) times daily.   Past Week   loperamide  (IMODIUM ) 2 MG capsule Take 1 capsule (2 mg total) by mouth 3 (three) times daily as needed for diarrhea or loose stools. (Patient not taking: Reported on 10/20/2023) 30 capsule 0    senna-docusate (SENOKOT-S) 8.6-50 MG tablet Take 1 tablet by mouth 2 (two) times daily as needed for mild constipation.      STELARA 90 MG/ML SOSY injection Inject 90 mg into the skin every 3 (three) months. (Patient not taking: Reported on 10/16/2023)      Allergies:  Allergies  Allergen Reactions   Nsaids Other (See Comments)    Crohn's disease & CKD   Ace Inhibitors Cough   Iodine Other (See Comments)    The patient said she had a reaction from iodine in some eye drops- caused eyes to turn red;  pt has no reactions to CT contrast    Family History  Problem Relation Age of Onset   Diabetes Mother    Dementia Mother    Clotting disorder Father    Macular degeneration Father    Crohn's disease Sister    Colon cancer Neg Hx    Esophageal cancer Neg Hx    Pancreatic cancer Neg Hx    Stomach cancer Neg Hx    Liver disease Neg Hx    Social History:  reports that she quit smoking about 10 months ago. Her smoking use included cigarettes. She has never used smokeless tobacco. She reports that she does not currently use alcohol. She reports that she does not currently use drugs after having used the following drugs: Cocaine and Marijuana.  ROS: A complete review of systems was performed.  All systems are negative except for pertinent findings as noted. ROS   Physical Exam:  Vital signs in last 24 hours: Temp:  [97.6 F (36.4 C)] 97.6 F (36.4 C) (01/09 1713) Pulse Rate:  [98] 98 (01/09 1713) Resp:  [16] 16 (01/09 1713) BP: (123)/(80) 123/80 (01/09 1713) SpO2:  [100  %] 100 % (01/09 1713) General:  Alert and oriented, No acute distress HEENT: Normocephalic, atraumatic Neck: No JVD or lymphadenopathy Cardiovascular: Regular rate and rhythm Lungs: Regular rate and effort Abdomen: Right abdomen with ostomy, liquid output. Back: Left-sided nephrostomy tube draining clear yellow urine. Extremities: No edema  Laboratory Data:  No results found for this or any previous visit (from the past 24 hours). Recent Results (from the past 240 hours)  C Difficile Quick Screen w PCR reflex     Status: None   Collection Time: 11/03/23  1:12 PM   Specimen: STOOL  Result Value Ref Range Status   C Diff antigen NEGATIVE NEGATIVE Final   C Diff toxin NEGATIVE NEGATIVE Final   C Diff interpretation No C. difficile detected.  Final    Comment: Performed at [facility], [address] [place], [place] [address]  Gastrointestinal Panel by PCR , Stool  Status: None   Collection Time: 11/04/23  4:19 PM   Specimen: Stool  Result Value Ref Range Status   Campylobacter species NOT DETECTED NOT DETECTED Final   Plesimonas shigelloides NOT DETECTED NOT DETECTED Final   Salmonella species NOT DETECTED NOT DETECTED Final   Yersinia enterocolitica NOT DETECTED NOT DETECTED Final   Vibrio species NOT DETECTED NOT DETECTED Final   Vibrio cholerae NOT DETECTED NOT DETECTED Final   Enteroaggregative E coli (EAEC) NOT DETECTED NOT DETECTED Final   Enteropathogenic E coli (EPEC) NOT DETECTED NOT DETECTED Final   Enterotoxigenic E coli (ETEC) NOT DETECTED NOT DETECTED Final   Shiga like toxin producing E coli (STEC) NOT DETECTED NOT DETECTED Final   Shigella/Enteroinvasive E coli (EIEC) NOT DETECTED NOT DETECTED Final   Cryptosporidium NOT DETECTED NOT DETECTED Final   Cyclospora cayetanensis NOT DETECTED NOT DETECTED Final   Entamoeba histolytica NOT DETECTED NOT DETECTED Final   Giardia lamblia NOT DETECTED NOT DETECTED Final   Adenovirus F40/41 NOT  DETECTED NOT DETECTED Final   Astrovirus NOT DETECTED NOT DETECTED Final   Norovirus GI/GII NOT DETECTED NOT DETECTED Final   Rotavirus A NOT DETECTED NOT DETECTED Final   Sapovirus (I, II, IV, and V) NOT DETECTED NOT DETECTED Final    Comment: Performed at [facility], [address], [place], [place] [address]  Urine Culture     Status: Abnormal   Collection Time: 11/10/23  8:11 AM   Specimen: Urine, Clean Catch  Result Value Ref Range Status   Specimen Description   Final    URINE, CLEAN CATCH Performed at [facility], [address], [place], [place] [address]    Special Requests   Final    NONE Performed at [facility], [address] [place], [place] [address]    Culture >=100,000 COLONIES/mL PSEUDOMONAS AERUGINOSA (A)  Final   Report Status 11/12/2023 FINAL  Final   Organism ID, Bacteria PSEUDOMONAS AERUGINOSA (A)  Final      Susceptibility   Pseudomonas aeruginosa - MIC*    CEFTAZIDIME 4 SENSITIVE Sensitive     CIPROFLOXACIN  <=0.25 SENSITIVE Sensitive     GENTAMICIN  <=1 SENSITIVE Sensitive     IMIPENEM 2 SENSITIVE Sensitive     PIP/TAZO <=4 SENSITIVE Sensitive ug/mL    CEFEPIME  2 SENSITIVE Sensitive     * >=100,000 COLONIES/mL PSEUDOMONAS AERUGINOSA   Creatinine: Recent Labs    11/06/23 0456 11/07/23 0540 11/10/23 0849  CREATININE 1.55* 1.66* 1.62*    Impression/Assessment:  63 year old female with a left renal pelvis stone status post nephrostomy tube placement for pyelonephritis.  She is preadmitted today due to urine culture resulting yesterday for Pseudomonas pansensitive.  Patient will be admitted for 24 hours of cefepime  antibiotics.  With plans for Left PCNL tomorrow afternoon.  Review of CT scan demonstrates calcification over the left iliac.  Appears to be ureteral stone but this was present on CT in 2023.  Indicating this is not a ureteral stone.  We discussed risk benefits alternatives to procedure  including infection, bleeding, damage to surrounding structures including the kidney ureter, possible need for long-term nephrostomy tube or stent.  This is discussed the need for possible immunization the kidney if bleeding occurs.  Patient voiced her understanding and consent was obtained.  Plan:  Admit to urology N.p.o. midnight Restart home octreotide  Cefepime  1 g every 12 hours Hold all anticoagulation OR tomorrow for left PCNL  Discussed CODE STATUS with the patient patient would like  to be full code   Steffan JAYSON Pea 11/12/2023, 5:28 PM

## 2023-11-13 ENCOUNTER — Other Ambulatory Visit: Payer: Self-pay

## 2023-11-13 ENCOUNTER — Observation Stay (HOSPITAL_COMMUNITY): Payer: Medicare Other

## 2023-11-13 ENCOUNTER — Encounter (HOSPITAL_COMMUNITY): Payer: Self-pay | Admitting: Urology

## 2023-11-13 ENCOUNTER — Observation Stay (HOSPITAL_COMMUNITY): Payer: Medicare Other | Admitting: Anesthesiology

## 2023-11-13 ENCOUNTER — Observation Stay (HOSPITAL_COMMUNITY): Payer: Medicare Other | Admitting: Medical

## 2023-11-13 ENCOUNTER — Encounter (HOSPITAL_COMMUNITY)
Admission: RE | Disposition: A | Discharge status: Home / Self Care | Payer: Self-pay | Source: Home / Self Care | Attending: Urology

## 2023-11-13 DIAGNOSIS — N200 Calculus of kidney: Secondary | ICD-10-CM

## 2023-11-13 DIAGNOSIS — Z87891 Personal history of nicotine dependence: Secondary | ICD-10-CM | POA: Diagnosis not present

## 2023-11-13 DIAGNOSIS — I10 Essential (primary) hypertension: Secondary | ICD-10-CM | POA: Diagnosis not present

## 2023-11-13 DIAGNOSIS — I129 Hypertensive chronic kidney disease with stage 1 through stage 4 chronic kidney disease, or unspecified chronic kidney disease: Secondary | ICD-10-CM | POA: Diagnosis not present

## 2023-11-13 DIAGNOSIS — N183 Chronic kidney disease, stage 3 unspecified: Secondary | ICD-10-CM

## 2023-11-13 HISTORY — PX: NEPHROLITHOTOMY: SHX5134

## 2023-11-13 LAB — CBC WITH DIFFERENTIAL/PLATELET
Abs Immature Granulocytes: 0.07 10*3/uL (ref 0.00–0.07)
Basophils Absolute: 0 10*3/uL (ref 0.0–0.1)
Basophils Relative: 0 %
Eosinophils Absolute: 0 10*3/uL (ref 0.0–0.5)
Eosinophils Relative: 0 %
HCT: 33.1 % — ABNORMAL LOW (ref 36.0–46.0)
Hemoglobin: 10.1 g/dL — ABNORMAL LOW (ref 12.0–15.0)
Immature Granulocytes: 0 %
Lymphocytes Relative: 2 %
Lymphs Abs: 0.3 10*3/uL — ABNORMAL LOW (ref 0.7–4.0)
MCH: 30.1 pg (ref 26.0–34.0)
MCHC: 30.5 g/dL (ref 30.0–36.0)
MCV: 98.8 fL (ref 80.0–100.0)
Monocytes Absolute: 0.1 10*3/uL (ref 0.1–1.0)
Monocytes Relative: 0 %
Neutro Abs: 17 10*3/uL — ABNORMAL HIGH (ref 1.7–7.7)
Neutrophils Relative %: 98 %
Platelets: 232 10*3/uL (ref 150–400)
RBC: 3.35 MIL/uL — ABNORMAL LOW (ref 3.87–5.11)
RDW: 14.8 % (ref 11.5–15.5)
WBC: 17.5 10*3/uL — ABNORMAL HIGH (ref 4.0–10.5)
nRBC: 0 % (ref 0.0–0.2)

## 2023-11-13 LAB — BASIC METABOLIC PANEL
Anion gap: 9 (ref 5–15)
BUN: 17 mg/dL (ref 8–23)
CO2: 16 mmol/L — ABNORMAL LOW (ref 22–32)
Calcium: 8.6 mg/dL — ABNORMAL LOW (ref 8.9–10.3)
Chloride: 111 mmol/L (ref 98–111)
Creatinine, Ser: 1.56 mg/dL — ABNORMAL HIGH (ref 0.44–1.00)
GFR, Estimated: 37 mL/min — ABNORMAL LOW (ref >60)
Glucose, Bld: 106 mg/dL — ABNORMAL HIGH (ref 70–99)
Potassium: 5.6 mmol/L — ABNORMAL HIGH (ref 3.5–5.1)
Sodium: 136 mmol/L (ref 135–145)

## 2023-11-13 SURGERY — NEPHROLITHOTOMY PERCUTANEOUS
Anesthesia: General | Site: Renal | Laterality: Left

## 2023-11-13 MED ORDER — ONDANSETRON HCL 4 MG/2ML IJ SOLN
INTRAMUSCULAR | Status: DC | PRN
  Administered 2023-11-13: 4 mg via INTRAVENOUS

## 2023-11-13 MED ORDER — METHOCARBAMOL 500 MG PO TABS
1000.0000 mg | ORAL_TABLET | Freq: Four times a day (QID) | ORAL | Status: DC
Start: 2023-11-13 — End: 2023-11-17
  Administered 2023-11-13 – 2023-11-17 (×14): 1000 mg via ORAL
  Filled 2023-11-13 (×14): qty 2

## 2023-11-13 MED ORDER — OXYCODONE HCL 5 MG PO TABS: 5.0000 mg | ORAL_TABLET | Freq: Four times a day (QID) | ORAL | 0 refills | Status: DC | PRN

## 2023-11-13 MED ORDER — ONDANSETRON HCL 4 MG/2ML IJ SOLN
INTRAMUSCULAR | Status: AC
  Filled 2023-11-13: qty 2

## 2023-11-13 MED ORDER — ROCURONIUM BROMIDE 10 MG/ML (PF) SYRINGE
PREFILLED_SYRINGE | INTRAVENOUS | Status: DC | PRN
  Administered 2023-11-13: 50 mg via INTRAVENOUS
  Administered 2023-11-13: 20 mg via INTRAVENOUS

## 2023-11-13 MED ORDER — DEXAMETHASONE SODIUM PHOSPHATE 10 MG/ML IJ SOLN
INTRAMUSCULAR | Status: DC | PRN
  Administered 2023-11-13: 10 mg via INTRAVENOUS

## 2023-11-13 MED ORDER — HYDROMORPHONE HCL 1 MG/ML IJ SOLN
0.2500 mg | INTRAMUSCULAR | Status: DC | PRN
  Administered 2023-11-13 (×3): 0.5 mg via INTRAVENOUS

## 2023-11-13 MED ORDER — PROPOFOL 10 MG/ML IV BOLUS
INTRAVENOUS | Status: DC | PRN
  Administered 2023-11-13: 120 mg via INTRAVENOUS

## 2023-11-13 MED ORDER — PHENYLEPHRINE 80 MCG/ML (10ML) SYRINGE FOR IV PUSH (FOR BLOOD PRESSURE SUPPORT)
PREFILLED_SYRINGE | INTRAVENOUS | Status: DC | PRN
  Administered 2023-11-13: 160 ug via INTRAVENOUS

## 2023-11-13 MED ORDER — FENTANYL CITRATE (PF) 100 MCG/2ML IJ SOLN
INTRAMUSCULAR | Status: DC | PRN
  Administered 2023-11-13 (×2): 50 ug via INTRAVENOUS

## 2023-11-13 MED ORDER — MEPERIDINE HCL 50 MG/ML IJ SOLN
INTRAMUSCULAR | Status: AC
  Filled 2023-11-13: qty 1

## 2023-11-13 MED ORDER — AMISULPRIDE (ANTIEMETIC) 5 MG/2ML IV SOLN: 10.0000 mg | Freq: Once | INTRAVENOUS | Status: DC | PRN

## 2023-11-13 MED ORDER — SODIUM CHLORIDE 0.9 % IR SOLN
Status: DC | PRN
  Administered 2023-11-13: 6000 mL via INTRAVESICAL
  Administered 2023-11-13: 3000 mL via INTRAVESICAL

## 2023-11-13 MED ORDER — MEPERIDINE HCL 50 MG/ML IJ SOLN
6.2500 mg | INTRAMUSCULAR | Status: DC | PRN
  Administered 2023-11-13: 12.5 mg via INTRAVENOUS

## 2023-11-13 MED ORDER — HYDROMORPHONE HCL 1 MG/ML IJ SOLN: 0.5000 mg | INTRAMUSCULAR | Status: DC | PRN

## 2023-11-13 MED ORDER — ONDANSETRON HCL 4 MG/2ML IJ SOLN: 4.0000 mg | Freq: Once | INTRAMUSCULAR | Status: DC | PRN

## 2023-11-13 MED ORDER — OXYCODONE HCL 5 MG PO TABS
5.0000 mg | ORAL_TABLET | Freq: Once | ORAL | Status: AC | PRN
  Administered 2023-11-13: 5 mg via ORAL

## 2023-11-13 MED ORDER — LIDOCAINE HCL (PF) 2 % IJ SOLN
INTRAMUSCULAR | Status: AC
  Filled 2023-11-13: qty 5

## 2023-11-13 MED ORDER — MELATONIN 5 MG PO TABS
5.0000 mg | ORAL_TABLET | Freq: Every evening | ORAL | Status: DC | PRN
  Administered 2023-11-13: 5 mg via ORAL
  Filled 2023-11-13: qty 1

## 2023-11-13 MED ORDER — CIPROFLOXACIN HCL 500 MG PO TABS: 500.0000 mg | ORAL_TABLET | Freq: Every day | ORAL | 0 refills | Status: DC

## 2023-11-13 MED ORDER — SODIUM CHLORIDE 0.9 % IV SOLN
1.0000 g | Freq: Once | INTRAVENOUS | Status: AC
  Administered 2023-11-13: 1 g via INTRAVENOUS
  Filled 2023-11-13: qty 10

## 2023-11-13 MED ORDER — TAMSULOSIN HCL 0.4 MG PO CAPS: 0.4000 mg | ORAL_CAPSULE | Freq: Every day | ORAL | 0 refills | Status: AC

## 2023-11-13 MED ORDER — PHENYLEPHRINE 80 MCG/ML (10ML) SYRINGE FOR IV PUSH (FOR BLOOD PRESSURE SUPPORT)
PREFILLED_SYRINGE | INTRAVENOUS | Status: AC
  Filled 2023-11-13: qty 10

## 2023-11-13 MED ORDER — HYDROMORPHONE HCL 1 MG/ML IJ SOLN
INTRAMUSCULAR | Status: AC
  Filled 2023-11-13: qty 1

## 2023-11-13 MED ORDER — ACETAMINOPHEN 10 MG/ML IV SOLN: 1000.0000 mg | Freq: Once | INTRAVENOUS | Status: DC | PRN

## 2023-11-13 MED ORDER — IOHEXOL 300 MG/ML  SOLN
INTRAMUSCULAR | Status: DC | PRN
  Administered 2023-11-13: 31 mL

## 2023-11-13 MED ORDER — LIDOCAINE 2% (20 MG/ML) 5 ML SYRINGE
INTRAMUSCULAR | Status: DC | PRN
  Administered 2023-11-13: 100 mg via INTRAVENOUS

## 2023-11-13 MED ORDER — 0.9 % SODIUM CHLORIDE (POUR BTL) OPTIME
TOPICAL | Status: DC | PRN
  Administered 2023-11-13: 1000 mL

## 2023-11-13 MED ORDER — OXYCODONE HCL 5 MG/5ML PO SOLN: 5.0000 mg | Freq: Once | ORAL | Status: AC | PRN

## 2023-11-13 MED ORDER — GENTAMICIN SULFATE 40 MG/ML IJ SOLN
INTRAVENOUS | Status: DC | PRN
  Administered 2023-11-13: 100 mg via INTRAVENOUS

## 2023-11-13 MED ORDER — METHOCARBAMOL 750 MG PO TABS: 750.0000 mg | ORAL_TABLET | Freq: Four times a day (QID) | ORAL | 0 refills | Status: AC

## 2023-11-13 MED ORDER — DEXAMETHASONE SODIUM PHOSPHATE 10 MG/ML IJ SOLN
INTRAMUSCULAR | Status: AC
  Filled 2023-11-13: qty 1

## 2023-11-13 MED ORDER — LACTATED RINGERS IV SOLN
INTRAVENOUS | Status: DC
Start: 2023-11-13 — End: 2023-11-13

## 2023-11-13 MED ORDER — ROCURONIUM BROMIDE 10 MG/ML (PF) SYRINGE
PREFILLED_SYRINGE | INTRAVENOUS | Status: AC
  Filled 2023-11-13: qty 10

## 2023-11-13 MED ORDER — FUROSEMIDE 10 MG/ML IJ SOLN
20.0000 mg | Freq: Once | INTRAMUSCULAR | Status: AC
  Administered 2023-11-13: 20 mg via INTRAVENOUS
  Filled 2023-11-13: qty 2

## 2023-11-13 MED ORDER — HYOSCYAMINE SULFATE 0.125 MG PO TBDP: 0.1250 mg | ORAL_TABLET | Freq: Four times a day (QID) | ORAL | 0 refills | Status: AC | PRN

## 2023-11-13 MED ORDER — CHLORHEXIDINE GLUCONATE 0.12 % MT SOLN
15.0000 mL | Freq: Once | OROMUCOSAL | Status: AC
  Administered 2023-11-13: 15 mL via OROMUCOSAL

## 2023-11-13 MED ORDER — PROPOFOL 10 MG/ML IV BOLUS
INTRAVENOUS | Status: AC
Start: 2023-11-13 — End: ?
  Filled 2023-11-13: qty 20

## 2023-11-13 MED ORDER — ORAL CARE MOUTH RINSE: 15.0000 mL | Freq: Once | OROMUCOSAL | Status: AC

## 2023-11-13 MED ORDER — MIDAZOLAM HCL 5 MG/5ML IJ SOLN
INTRAMUSCULAR | Status: DC | PRN
  Administered 2023-11-13: 2 mg via INTRAVENOUS

## 2023-11-13 MED ORDER — HYDROMORPHONE HCL 1 MG/ML IJ SOLN
INTRAMUSCULAR | Status: AC
  Administered 2023-11-13: 0.5 mg via INTRAVENOUS
  Filled 2023-11-13: qty 1

## 2023-11-13 MED ORDER — FENTANYL CITRATE (PF) 100 MCG/2ML IJ SOLN
INTRAMUSCULAR | Status: AC
  Filled 2023-11-13: qty 2

## 2023-11-13 MED ORDER — SUGAMMADEX SODIUM 200 MG/2ML IV SOLN
INTRAVENOUS | Status: DC | PRN
  Administered 2023-11-13: 200 mg via INTRAVENOUS

## 2023-11-13 MED ORDER — MIDAZOLAM HCL 2 MG/2ML IJ SOLN
INTRAMUSCULAR | Status: AC
  Filled 2023-11-13: qty 2

## 2023-11-13 MED ORDER — OXYCODONE HCL 5 MG PO TABS
ORAL_TABLET | ORAL | Status: AC
  Filled 2023-11-13: qty 1

## 2023-11-13 MED ORDER — LACTATED RINGERS IV SOLN: INTRAVENOUS | Status: AC

## 2023-11-13 SURGICAL SUPPLY — 57 items
BAG COUNTER SPONGE SURGICOUNT (BAG) IMPLANT
BAG URINE DRAIN 2000ML AR STRL (UROLOGICAL SUPPLIES) IMPLANT
BASKET STONE NCOMPASS (UROLOGICAL SUPPLIES) IMPLANT
BASKET ZERO TIP NITINOL 2.4FR (BASKET) IMPLANT
BENZOIN TINCTURE PRP APPL 2/3 (GAUZE/BANDAGES/DRESSINGS) ×1 IMPLANT
BLADE SURG 15 STRL LF DISP TIS (BLADE) ×1 IMPLANT
BOOTIES KNEE HIGH SLOAN (MISCELLANEOUS) IMPLANT
CATH 2WAY 30CC 24FR (CATHETERS) IMPLANT
CATH FOLEY 2W COUNCIL 20FR 5CC (CATHETERS) IMPLANT
CATH FOLEY 2WAY SLVR 5CC 20FR (CATHETERS) IMPLANT
CATH URETERAL DUAL LUMEN 10F (MISCELLANEOUS) ×1 IMPLANT
CATH URETL OPEN 5X70 (CATHETERS) ×1 IMPLANT
CATH UROLOGY TORQUE 65 (CATHETERS) IMPLANT
CATH X-FORCE N30 NEPHROSTOMY (TUBING) ×1 IMPLANT
CHLORAPREP W/TINT 26 (MISCELLANEOUS) ×1 IMPLANT
DRAPE C-ARM 42X120 X-RAY (DRAPES) ×1 IMPLANT
DRAPE LINGEMAN PERC (DRAPES) ×1 IMPLANT
DRAPE SURG IRRIG POUCH 19X23 (DRAPES) ×2 IMPLANT
DRSG TEGADERM 8X12 (GAUZE/BANDAGES/DRESSINGS) ×2 IMPLANT
EXTRACTOR STONE 1.7FRX115CM (UROLOGICAL SUPPLIES) IMPLANT
GAUZE PAD ABD 8X10 STRL (GAUZE/BANDAGES/DRESSINGS) IMPLANT
GAUZE SPONGE 4X4 12PLY STRL (GAUZE/BANDAGES/DRESSINGS) IMPLANT
GLOVE BIO SURGEON STRL SZ8 (GLOVE) ×1 IMPLANT
GOWN STRL REUS W/ TWL XL LVL3 (GOWN DISPOSABLE) ×1 IMPLANT
GUIDEWIRE AMPLAZ .035X145 (WIRE) ×2 IMPLANT
GUIDEWIRE ANG ZIPWIRE 038X150 (WIRE) IMPLANT
GUIDEWIRE STR DUAL SENSOR (WIRE) ×1 IMPLANT
HLDR NDL AMPLATZ W/INSERTS (MISCELLANEOUS) IMPLANT
HOLDER NEEDLE AMPLATZ W/INSERT (MISCELLANEOUS)
IV SET EXTENSION CATH 6 NF (IV SETS) ×1 IMPLANT
KIT BASIN OR (CUSTOM PROCEDURE TRAY) ×1 IMPLANT
KIT PROBE TRILOGY 3.9X350 (MISCELLANEOUS) IMPLANT
KIT TURNOVER KIT A (KITS) IMPLANT
MANIFOLD NEPTUNE II (INSTRUMENTS) ×1 IMPLANT
MAT ABSORB FLUID 56X50 GRAY (MISCELLANEOUS) ×1 IMPLANT
NDL SPNL 20GX3.5 QUINCKE YW (NEEDLE) IMPLANT
NDL TROCAR 18X15 ECHO (NEEDLE) IMPLANT
NDL TROCAR 18X20 (NEEDLE) IMPLANT
NEEDLE SPNL 20GX3.5 QUINCKE YW (NEEDLE) ×1
NEEDLE TROCAR 18X15 ECHO (NEEDLE)
NEEDLE TROCAR 18X20 (NEEDLE)
NS IRRIG 1000ML POUR BTL (IV SOLUTION) ×1 IMPLANT
PACK CYSTO (CUSTOM PROCEDURE TRAY) ×1 IMPLANT
SHEATH PEELAWAY SET 9 (SHEATH) IMPLANT
SPONGE T-LAP 4X18 ~~LOC~~+RFID (SPONGE) ×1 IMPLANT
STENT URET 6FRX26 CONTOUR (STENTS) IMPLANT
SUT CHROMIC 3 0 SH 27 (SUTURE) IMPLANT
SUT ETHILON 3 0 PS 1 (SUTURE) IMPLANT
SYR 10ML LL (SYRINGE) ×1 IMPLANT
SYR 20ML LL LF (SYRINGE) ×2 IMPLANT
SYR 50ML LL SCALE MARK (SYRINGE) ×1 IMPLANT
TAPE CLOTH SURG 4X10 WHT LF (GAUZE/BANDAGES/DRESSINGS) IMPLANT
TOWEL OR 17X26 10 PK STRL BLUE (TOWEL DISPOSABLE) ×1 IMPLANT
TRAY FOLEY MTR SLVR 16FR STAT (SET/KITS/TRAYS/PACK) ×1 IMPLANT
TUBING CONNECTING 10 (TUBING) ×2 IMPLANT
TUBING STONE CATCHER TRILOGY (MISCELLANEOUS) IMPLANT
TUBING UROLOGY SET (TUBING) ×1 IMPLANT

## 2023-11-13 NOTE — Progress Notes (Signed)
 Post op K 5.6. EKG ordered normal patient doing well. Will gie lasix and recheck BMP in AM. Placed on telemetry. CBC still pending will allow to eat once resulted.

## 2023-11-13 NOTE — Progress Notes (Signed)
  Subjective: Doing well, no pain, no N/V. NPO for surgery today  Objective: Vital signs in last 24 hours: Temp:  [97.6 F (36.4 C)-98.4 F (36.9 C)] 98.1 F (36.7 C) (01/10 0401) Pulse Rate:  [72-98] 78 (01/10 0500) Resp:  [16-18] 16 (01/10 0401) BP: (98-123)/(59-80) 120/69 (01/10 0500) SpO2:  [99 %-100 %] 100 % (01/10 0401)  Intake/Output from previous day: 01/09 0701 - 01/10 0700 In: 440.1 [P.O.:240; IV Piggyback:200.1] Out: 210 [Urine:60; Stool:150] Intake/Output this shift: Total I/O In: 440.1 [P.O.:240; IV Piggyback:200.1] Out: 210 [Urine:60; Stool:150]  Physical Exam:  General: Alert and oriented CV: RRR Lungs: Clear Abdomen: Soft, ND, ostomy in place with output Back: L neph tube in place with clear urine drainage.   Lab Results: Recent Labs    11/10/23 0849  HGB 9.8*  HCT 31.3*   BMET Recent Labs    11/10/23 0849  NA 137  K 4.4  CL 112*  CO2 18*  GLUCOSE 66*  BUN 20  CREATININE 1.62*  CALCIUM  8.3*     Studies/Results: No results found.  Assessment/Plan: L renal pelvis stone.  - NPO until surgery - Left side marked  - plan for PCNL today  2. UC/ high ostomy ouptut  - on octreotide  - ostomy urse consulted    LOS: 0 days   Jackey Pea MD 11/13/2023, 6:55 AM [facility]

## 2023-11-13 NOTE — Plan of Care (Addendum)
  Problem: Education: Goal: Knowledge of General Education information will improve Description: Including pain rating scale, medication(s)/side effects and non-pharmacologic comfort measures Outcome: Progressing   Problem: Health Behavior/Discharge Planning: Goal: Ability to manage health-related needs will improve Outcome: Progressing   Problem: Clinical Measurements: Goal: Ability to maintain clinical measurements within normal limits will improve Outcome: Progressing   Problem: Activity: Goal: Risk for activity intolerance will decrease Outcome: Progressing   Problem: Nutrition: Goal: Adequate nutrition will be maintained Outcome: Progressing   Problem: Coping: Goal: Level of anxiety will decrease Outcome: Progressing   Problem: Elimination: Goal: Will not experience complications related to bowel motility Outcome: Progressing Goal: Will not experience complications related to urinary retention Outcome: Progressing   Problem: Pain Management: Goal: General experience of comfort will improve Outcome: Progressing   Problem: Safety: Goal: Ability to remain free from injury will improve Outcome: Progressing   Problem: Skin Integrity: Goal: Risk for impaired skin integrity will decrease Outcome: Progressing

## 2023-11-13 NOTE — Consult Note (Addendum)
 WOC Nurse ostomy consult note; patient familiar to WOC team from previous admissions; last seen 11/03/2023; had RUQ ileostomy placed at [facility] 2022 Stoma type/location: RUQ ileostomy  Stomal assessment/size: 1 1/4 round, slightly prolapsed, pink moist productive of soft stool  Peristomal assessment: MASD to skin distal to stoma extending left towards umbilicus  ICD-10 CM Codes for Irritant Dermatitis L24B3 - Related to fecal or urinary stoma or fistula  Treatment options for stomal/peristomal skin: 2 barrier ring, crusted entire area with stoma powder ([name] #6) and no sting barrier wipes  Output approximately 200 mls mushy brown stool  Ostomy pouching: 2 piece 2 1/4 skin barrier Soila [phone]), 2 1/4 pouch Soila [phone]) and 2 barrier ring Soila [phone]); provided patient with stoma powder Soila #6) and ostomy belt Soila [phone])  Education provided: none, patient is independent in care of ostomy.   Patient should crust with stoma powder and no sting barrier wipes with each skin barrier change:  Sprinkle stoma powder over any irritated skin, brush away excess powder Tap lightly over the powder with Cavilon No-Sting barrier wipe (skin barrier wipe-white and blue package)  Allow to dry Apply another layer of powder following the same steps up to three layers.  After dry proceed with routine pouching    Enrolled patient in [employer] DC program: no, established ostomy   Will provide patient with (8) sets of 2 1/4 skin barrier, 2 1/4 pouch and 2 barrier ring for home.  Patient changes ostomy pouch frequently due to leaking per patient.  Patient has been provided on previous visit with information regarding ostomy clinic which would be beneficial for patient in obtaining supplies.  Per patient does have insurance.   [facility] team will not follow.  Re-consult if further needs arise.   Thanks,  [employer] MSN, RN-BC, [employer] [phone]

## 2023-11-13 NOTE — Op Note (Signed)
 Operative Note  Preoperative diagnosis:  1. Left Renal calculus  Postoperative diagnosis: 1. Left Renal calculus   Procedure(s): 1. Left Percutaneous nephrolithotomy stone size 2.57 cm  2.  Antegrade ureteroscopy  Surgeon: Steffan Pea   Assistants:None   Anesthesia: General  Complications: None immediate  EBL: 30 cc  Specimens: 1.  Renal calculus  Drains/Catheters: 1. 89fr urethral catheter 2.  6 x 26 double-J stent left ureter 3.  20 French council catheter as nephrostomy tube left kidney  Intraoperative findings:  1.  Matrix like stone removed 2.  All visual stone removed 3.  No stone noted within the ureter 4.  Confirmation of stent correct location based on fluoroscopy.  Indication: 63 year old with a large left renal calculus presents for the previously mentioned operation.  Had nephrostomy tube placed for obstructive pyelonephritis in early December returns today for left PCNL and stent removal.  Description of procedure:  The patient was identified and consent was obtained.  The patient was taken to the operating room and placed in the supine position.  The patient was placed under general anesthesia.  Perioperative antibiotics were administered.  The patient was placed in prone position and all pressure points were padded.  Patient was prepped and draped in a standard sterile fashion and a timeout was performed.  An antegrade nephrostogram was completing demonstrating the nephrostomy tube in the correct location.  Antegrade nephrostogram demonstrated no collecting system injuries and nephrostomy tube in the correct position as well as existing stent.  The nephrostomy tube was then cut and a sensor wire was placed down the nephrostomy tube and guided into the ureter.  Once sensor wire confirmed in the ureter.  A dual-lumen ureteral catheter was advanced over the sensor wire wire into the renal pelvis and into the ureter.  A Super Stiff wire was then advanced down  the other aspect of the dual-lumen catheter into the bladder..  The dual-lumen catheter was removed.  An incision was made alongside the wires.  The balloon dilator was then advanced over one of the wires and into the renal pelvis fluoroscopic guidance and the tract was dilated to a pressure of 25.  The sheath was advanced over the balloon and into the renal pelvis.  The balloon was withdrawn keeping the sheath in place.  The nephroscope was advanced into the kidney and the stone of interest was encountered.  The stone was then removed with a combination of pneumatic and ultrasound with suction.  Once it was felt that all stones were removed a flexible cystoscope was used to inspect the remainder of the kidney no further stones were visualized.  The ureter was then inspected using a ureteroscope.  The ureteroscope was slid over the existing Super Stiff wire into the bladder.  There was noted to be some matrix light stone in the distal ureter which was expelled into the bladder using the fluid from the ureteroscope.  Inspection of the ureter demonstrated no residual stone.  All stone was removed and there was no evidence of any other stones within the kidney.  The nephroscope was then withdrawn.   A 6 x 26 double-J ureteral stent was advanced over 1 of the wires under fluoroscopic guidance and the wire was withdrawn.  Fluoroscopy confirmed a good coil within the bladder as well as a good coil in the renal pelvis proximally.  A 20 French council tip catheter was advanced over the wire and 3 cc of contrast was instilled into the catheter balloon.  Nephrostogram  was performed with no extravasation of contrast noted.  The council tip catheter was secured down with a silk suture.  This concluded the operation.  The patient tolerated procedure well and was stable postoperatively.  Plan:  Patient will get CBC postop to confirm no bleeding.  Once CBC results patient will be able to eat. Patient will be void trial at  6 AM tomorrow morning and nephrostomy tube will be clamped at that same time. If patient tolerates clamp trial neph tube can be removed and patient can be discharged. Patient will remain under observation overnight. Stent to be removed in 2 weeks.

## 2023-11-13 NOTE — Transfer of Care (Signed)
 Immediate Anesthesia Transfer of Care Note  Patient: ERA PARR  Procedure(s) Performed: LEFT NEPHROLITHOTOMY PERCUTANEOUS (Left: Renal)  Patient Location: PACU  Anesthesia Type:General  Level of Consciousness: sedated  Airway & Oxygen Therapy: Patient Spontanous Breathing and Patient connected to face mask oxygen  Post-op Assessment: Report given to RN and Post -op Vital signs reviewed and stable  Post vital signs: Reviewed and stable  Last Vitals:  Vitals Value Taken Time  BP    Temp    Pulse 86 11/13/23 1447  Resp 15 11/13/23 1447  SpO2 100 % 11/13/23 1447  Vitals shown include unfiled device data.  Last Pain:  Vitals:   11/13/23 0900  TempSrc:   PainSc: 0-No pain         Complications: No notable events documented.

## 2023-11-13 NOTE — Discharge Instructions (Addendum)
 Discharge instructions following PCNL  Call your doctor for: Fevers greater than 100.5 Severe nausea or vomiting Increasing pain not controlled by pain medication Increasing redness or drainage from incisions Decreased urine output or a catheter is no longer draining  The number for questions is [phone].  Activity: Gradually increase activity with short frequent walks, 3-4 times a day.  Avoid strenuous activities, like sports, lawn-mowing, or heavy lifting (more than 10-15 pounds) for 3 weeks after sugery.  Wear loose, comfortable clothing that pull or kink the tube or tubes.  Do not drive while taking pain medication, or until your doctor permitts it.  Bathing and dressing changes: You should not shower for 48 hours after surgery.  Do not soak your back in a bathtub or submerge the incision in water  for 2 weeks.   Drainage bag care: You may be discharged with a drainage bag around the site of your surgery.  The drainage bag should be secured such that it never pulls or loosens to prevent it from leaking.  It is important to wash her hands before and after emptying the drainage bag to help prevent the spread of infection.  The drainage bag should be emptied as needed.  When the wound stops draining or it is manageable with a dry gauze dressing, you can remove the bag.  Diet: It is extremely important to drink plenty of fluids after surgery, especially water .  You may resume your regular diet, unless otherwise instructed.  Medications: May take Tylenol  (acetaminophen )  Take any prescriptions as directed. Hyoscyamine  for bladder spasms Tamsulosin  for stent pain Oxycodone  and methocarbamol  for pain  Ciprofloxacin  for Pseudomonas infection (take once a day for 5 days)  Follow-up appointments: Follow-up appointment will be scheduled with Dr. Shane in 10-14 days for hospital check and stent removal.   Dressing change: 1 times per day using gauze and metapore tape.

## 2023-11-13 NOTE — Anesthesia Procedure Notes (Signed)
 Procedure Name: Intubation Date/Time: 11/13/2023 12:50 PM  Performed by: Carleton Garnette SAUNDERS, CRNAPre-anesthesia Checklist: Patient identified, Emergency Drugs available, Suction available, Patient being monitored and Timeout performed Patient Re-evaluated:Patient Re-evaluated prior to induction Oxygen Delivery Method: Circle system utilized Preoxygenation: Pre-oxygenation with 100% oxygen Induction Type: IV induction Ventilation: Mask ventilation without difficulty Laryngoscope Size: Mac, 3 and Glidescope Grade View: Grade I Tube type: Oral Tube size: 7.0 mm Number of attempts: 1 Airway Equipment and Method: Stylet Placement Confirmation: ETT inserted through vocal cords under direct vision, positive ETCO2 and breath sounds checked- equal and bilateral Secured at: 22 cm Tube secured with: Tape Dental Injury: Teeth and Oropharynx as per pre-operative assessment

## 2023-11-13 NOTE — Care Management Obs Status (Signed)
 MEDICARE OBSERVATION STATUS NOTIFICATION   Patient Details  Name: Cynthia [name] MRN: [id number] Date of Birth: 1960/12/16   Medicare Observation Status Notification Given:   Yes  Patient in PACU and groggy, spoke with patient's representative (daughter Manuelita Avers ph# [phone]) who verbalized understanding.      Stefan JONELLE Cory, RN 11/13/2023, 3:57 PM

## 2023-11-13 NOTE — Care Management (Addendum)
 This RNCM attempted to reach patient's daughter to discuss OBS. Left voicemail message for a call back.   TOC following - Received call back from patient's daughter. This RNCM explained OBS status to patient's daughter Manuelita due to patient currently in PACU. This RNCM will leave a copy of Medicare OBS Notice.

## 2023-11-13 NOTE — Progress Notes (Signed)
 Lab tech reported would be down shortly to draw stat labs. [phone]. Md to bedside talked with patient asked writer call about stat labs (cbc,cmp) lab tech called as requested.

## 2023-11-13 NOTE — Interval H&P Note (Signed)
 History and Physical Interval Note: NO changes to OR for L PCNL  11/13/2023 12:42 PM  Cynthia [name]  has presented today for surgery, with the diagnosis of LEFT RENAL STONE.  The various methods of treatment have been discussed with the patient and family. After consideration of risks, benefits and other options for treatment, the patient has consented to  Procedure(s) with comments: LEFT NEPHROLITHOTOMY PERCUTANEOUS (Left) - 120 MINUTE CASE as a surgical intervention.  The patient's history has been reviewed, patient examined, no change in status, stable for surgery.  I have reviewed the patient's chart and labs.  Questions were answered to the patient's satisfaction.     Steffan JAYSON Pea

## 2023-11-13 NOTE — Plan of Care (Signed)
  Problem: Clinical Measurements: Goal: Diagnostic test results will improve Outcome: Progressing Goal: Respiratory complications will improve Outcome: Progressing Goal: Cardiovascular complication will be avoided Outcome: Progressing   

## 2023-11-14 DIAGNOSIS — N202 Calculus of kidney with calculus of ureter: Secondary | ICD-10-CM | POA: Diagnosis present

## 2023-11-14 DIAGNOSIS — K219 Gastro-esophageal reflux disease without esophagitis: Secondary | ICD-10-CM | POA: Diagnosis present

## 2023-11-14 DIAGNOSIS — I129 Hypertensive chronic kidney disease with stage 1 through stage 4 chronic kidney disease, or unspecified chronic kidney disease: Secondary | ICD-10-CM | POA: Diagnosis present

## 2023-11-14 DIAGNOSIS — Z87442 Personal history of urinary calculi: Secondary | ICD-10-CM | POA: Diagnosis not present

## 2023-11-14 DIAGNOSIS — Z87440 Personal history of urinary (tract) infections: Secondary | ICD-10-CM | POA: Diagnosis not present

## 2023-11-14 DIAGNOSIS — N179 Acute kidney failure, unspecified: Secondary | ICD-10-CM | POA: Diagnosis present

## 2023-11-14 DIAGNOSIS — Z833 Family history of diabetes mellitus: Secondary | ICD-10-CM | POA: Diagnosis not present

## 2023-11-14 DIAGNOSIS — Z936 Other artificial openings of urinary tract status: Secondary | ICD-10-CM | POA: Diagnosis not present

## 2023-11-14 DIAGNOSIS — F32A Depression, unspecified: Secondary | ICD-10-CM | POA: Diagnosis present

## 2023-11-14 DIAGNOSIS — G2581 Restless legs syndrome: Secondary | ICD-10-CM | POA: Diagnosis present

## 2023-11-14 DIAGNOSIS — N200 Calculus of kidney: Secondary | ICD-10-CM | POA: Diagnosis present

## 2023-11-14 DIAGNOSIS — Z79899 Other long term (current) drug therapy: Secondary | ICD-10-CM | POA: Diagnosis not present

## 2023-11-14 DIAGNOSIS — F419 Anxiety disorder, unspecified: Secondary | ICD-10-CM | POA: Diagnosis present

## 2023-11-14 DIAGNOSIS — N183 Chronic kidney disease, stage 3 unspecified: Secondary | ICD-10-CM | POA: Diagnosis present

## 2023-11-14 DIAGNOSIS — Z888 Allergy status to other drugs, medicaments and biological substances status: Secondary | ICD-10-CM | POA: Diagnosis not present

## 2023-11-14 DIAGNOSIS — Z90722 Acquired absence of ovaries, bilateral: Secondary | ICD-10-CM | POA: Diagnosis not present

## 2023-11-14 DIAGNOSIS — B965 Pseudomonas (aeruginosa) (mallei) (pseudomallei) as the cause of diseases classified elsewhere: Secondary | ICD-10-CM | POA: Diagnosis present

## 2023-11-14 DIAGNOSIS — Z832 Family history of diseases of the blood and blood-forming organs and certain disorders involving the immune mechanism: Secondary | ICD-10-CM | POA: Diagnosis not present

## 2023-11-14 DIAGNOSIS — E875 Hyperkalemia: Secondary | ICD-10-CM | POA: Diagnosis not present

## 2023-11-14 DIAGNOSIS — Z90710 Acquired absence of both cervix and uterus: Secondary | ICD-10-CM | POA: Diagnosis not present

## 2023-11-14 DIAGNOSIS — Z860101 Personal history of adenomatous and serrated colon polyps: Secondary | ICD-10-CM | POA: Diagnosis not present

## 2023-11-14 DIAGNOSIS — Z87891 Personal history of nicotine dependence: Secondary | ICD-10-CM | POA: Diagnosis not present

## 2023-11-14 DIAGNOSIS — Z9079 Acquired absence of other genital organ(s): Secondary | ICD-10-CM | POA: Diagnosis not present

## 2023-11-14 DIAGNOSIS — K5000 Crohn's disease of small intestine without complications: Secondary | ICD-10-CM | POA: Diagnosis present

## 2023-11-14 LAB — BASIC METABOLIC PANEL
Anion gap: 9 (ref 5–15)
BUN: 23 mg/dL (ref 8–23)
CO2: 21 mmol/L — ABNORMAL LOW (ref 22–32)
Calcium: 8.4 mg/dL — ABNORMAL LOW (ref 8.9–10.3)
Chloride: 107 mmol/L (ref 98–111)
Creatinine, Ser: 1.89 mg/dL — ABNORMAL HIGH (ref 0.44–1.00)
GFR, Estimated: 29 mL/min — ABNORMAL LOW (ref >60)
Glucose, Bld: 100 mg/dL — ABNORMAL HIGH (ref 70–99)
Potassium: 4.8 mmol/L (ref 3.5–5.1)
Sodium: 137 mmol/L (ref 135–145)

## 2023-11-14 LAB — CBC
HCT: 30.2 % — ABNORMAL LOW (ref 36.0–46.0)
Hemoglobin: 9.5 g/dL — ABNORMAL LOW (ref 12.0–15.0)
MCH: 30.6 pg (ref 26.0–34.0)
MCHC: 31.5 g/dL (ref 30.0–36.0)
MCV: 97.4 fL (ref 80.0–100.0)
Platelets: 214 10*3/uL (ref 150–400)
RBC: 3.1 MIL/uL — ABNORMAL LOW (ref 3.87–5.11)
RDW: 14.8 % (ref 11.5–15.5)
WBC: 18.5 10*3/uL — ABNORMAL HIGH (ref 4.0–10.5)
nRBC: 0 % (ref 0.0–0.2)

## 2023-11-14 NOTE — TOC Initial Note (Signed)
 Transition of Care [facility]) - Initial/Assessment Note    Patient Details  Name: Cynthia [name] MRN: [id number] Date of Birth: 07/28/1961  [facility]) CM/SW Contact:    Tawni CHRISTELLA Eva, LCSW Phone Number: 11/14/2023, 3:31 PM  Clinical Narrative:                 CSW received a consult for medication assistance. Pt is insured and does not qualify for the [facility] program  Expected Discharge Plan: Home/Self Care Barriers to Discharge: Continued Medical Work up   Patient Goals and CMS Choice            Expected Discharge Plan and Services                                              Prior Living Arrangements/Services                       Activities of Daily Living   ADL Screening (condition at time of admission) Independently performs ADLs?: Yes (appropriate for developmental age) Is the patient deaf or have difficulty hearing?: No Does the patient have difficulty seeing, even when wearing glasses/contacts?: No Does the patient have difficulty concentrating, remembering, or making decisions?: Yes  Permission Sought/Granted                  Emotional Assessment              Admission diagnosis:  Kidney stone on left side [N20.0] Patient Active Problem List   Diagnosis Date Noted   Kidney stone on left side 11/12/2023   UTI (urinary tract infection) 11/03/2023   AKI (acute kidney injury) (HCC) 11/02/2023   Contamination of blood culture 10/11/2023   Obstruction of left ureteropelvic junction (UPJ) due to stone 10/10/2023   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   Acute cystitis with hematuria 10/10/2023   CKD (chronic kidney disease) stage 3, GFR 30-59 ml/min (HCC) 04/13/2022   Anxiety 04/13/2022   Irritant contact dermatitis associated with fecal stoma 04/13/2022   Hypoglycemia 12/25/2021   Vitamin B deficiency, unspecified 10/24/2021   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   High anion gap metabolic acidosis 07/10/2021    Nausea and vomiting 06/26/2021   Adjustment disorder 03/14/2021   Unspecified severe protein-calorie malnutrition (HCC) 03/13/2021   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   History of rectal polyp 2020 01/12/2021   Anxiety associated with depression 01/12/2021   High risk medication use 11/27/2020   Crohn's ileitis & colitis s/p ileocolectomy/ileostomy and sigmoid colectomy/mucus fistula 10/05/2019   Rectal polyp    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, [place] - [address] [place] [address] Phone: [phone] Fax: [phone]  [id number] [address] [place] - [address] [place] [address] Phone: [phone] Fax: [phone]  [facility] [address] [place] [address] Phone: [phone] Fax: [phone]  [facility] - [facility] [address] [facility]  [place] [address] Phone: [phone] Fax: [phone]  [id number] - [place], [place] - [address] AT [facility] OF [address] [place] [address] Phone: [phone] Fax: [phone]     Social Drivers of Health (SDOH) Social History: SDOH Screenings   Food Insecurity: No Food Insecurity (11/12/2023)  Recent Concern: Food Insecurity - Food Insecurity Present (11/09/2023)   Received from [facility]  Housing: Low Risk  (11/13/2023)  Recent Concern: Housing - High Risk (11/09/2023)   Received from [facility], [facility]  Transportation Needs:  Unmet Transportation Needs (11/12/2023)  Utilities: At Risk (11/12/2023)  Depression (PHQ2-9): Low Risk  (09/29/2023)  Financial Resource Strain: Low Risk  (11/09/2023)   Received from [facility], [facility]  Physical Activity: Unknown (11/09/2023)   Received from [facility], [facility]  Social Connections: Moderately Integrated (11/09/2023)   [facility]  Stress: Stress Concern Present (11/09/2023)   [facility]  Tobacco Use: Medium Risk (11/13/2023)   SDOH Interventions:     Readmission Risk Interventions    08/20/2021    1:07 PM 04/09/2021    9:32 AM  Readmission Risk Prevention Plan  Transportation Screening Complete Complete  Medication Review (RN Care Manager) Complete Complete  PCP or Specialist appointment within 3-5 days of discharge Complete Complete  HRI or Home Care Consult Complete Complete  SW Recovery Care/Counseling Consult Complete Complete  Palliative Care Screening Not Applicable Not Applicable  Skilled Nursing Facility Not Applicable Not Applicable

## 2023-11-14 NOTE — Plan of Care (Signed)
 Patients urostomy was pulled 0600 and she has voided 3 times with no residual.  Ambulated in room several times.

## 2023-11-14 NOTE — Progress Notes (Signed)
 Nephrostomy plugged and foley catheter removed per order.

## 2023-11-14 NOTE — Plan of Care (Signed)
  Problem: Clinical Measurements: Goal: Diagnostic test results will improve Outcome: Progressing Goal: Respiratory complications will improve Outcome: Progressing Goal: Cardiovascular complication will be avoided Outcome: Progressing   Problem: Safety: Goal: Ability to remain free from injury will improve Outcome: Progressing

## 2023-11-14 NOTE — Progress Notes (Signed)
 Urology Inpatient Progress Report  Kidney stone on left side [N20.0]  Procedure(s): LEFT NEPHROLITHOTOMY PERCUTANEOUS  1 Day Post-Op   Intv/Subj: Patient with poor pain control overnight.  On tele overnight for hyperkalemia which returned to normal this AM.  PCN capped and foley removed at 6am.  Principal Problem:   Kidney stone on left side  Current Facility-Administered Medications  Medication Dose Route Frequency Provider Last Rate Last Admin   acetaminophen  (TYLENOL ) tablet 650 mg  650 mg Oral Q4H PRN Showalter, Victor C, MD       ceFEPIme  (MAXIPIME ) 1 g in sodium chloride  0.9 % 100 mL IVPB  1 g Intravenous Q12H Showalter, Victor C, MD 200 mL/hr at 11/14/23 0313 1 g at 11/14/23 0313   diphenhydrAMINE  (BENADRYL ) injection 12.5-25 mg  12.5-25 mg Intravenous Q6H PRN Showalter, Victor C, MD       Or   diphenhydrAMINE  (BENADRYL ) 12.5 MG/5ML elixir 12.5-25 mg  12.5-25 mg Oral Q6H PRN Showalter, Victor C, MD       HYDROmorphone  (DILAUDID ) injection 0.5 mg  0.5 mg Intravenous Q3H PRN Showalter, Victor C, MD       hyoscyamine  (LEVSIN  SL) SL tablet 0.125 mg  0.125 mg Sublingual Q4H PRN Showalter, Victor C, MD       melatonin tablet 5 mg  5 mg Oral QHS PRN Showalter, Victor C, MD   5 mg at 11/13/23 2223   methocarbamol  (ROBAXIN ) tablet 1,000 mg  1,000 mg Oral QID Showalter, Victor C, MD   1,000 mg at 11/13/23 2202   octreotide  (SANDOSTATIN ) injection 50 mcg  50 mcg Subcutaneous Q8H Showalter, Victor C, MD   50 mcg at 11/13/23 2206   ondansetron  (ZOFRAN ) injection 4 mg  4 mg Intravenous Q4H PRN Showalter, Victor C, MD       oxyCODONE  (Oxy IR/ROXICODONE ) immediate release tablet 5 mg  5 mg Oral Q4H PRN Showalter, Victor C, MD       sodium bicarbonate  tablet 650 mg  650 mg Oral BID Shane Steffan BROCKS, MD   650 mg at 11/13/23 2202     Objective: Vital: Vitals:   11/13/23 1640 11/13/23 2009 11/14/23 0455 11/14/23 0503  BP: 129/80 95/68 124/69   Pulse: 93 85 (!) 111   Resp: 18   15   Temp: 98.8 F (37.1 C) 98.1 F (36.7 C) 100.1 F (37.8 C)   TempSrc: Oral Oral Oral   SpO2: 100% 100% 100%    I/Os: I/O last 3 completed shifts: In: 1740.1 [P.O.:240; I.V.:1300; IV Piggyback:200.1] Out: 360 [Urine:210; Stool:150]  Physical Exam:  General: Patient is in no apparent distress Lungs: Normal respiratory effort, chest expands symmetrically. Right PCN tube capped, dressing c/d/I, mildly ttp on right flank  Abd: right ostomy with liquid output GU: foley removed Ext: lower extremities symmetric  Lab Results: Recent Labs    11/13/23 1710 11/14/23 0435  WBC 17.5* 18.5*  HGB 10.1* 9.5*  HCT 33.1* 30.2*   Recent Labs    11/13/23 1542  NA 136  K 5.6*  CL 111  CO2 16*  GLUCOSE 106*  BUN 17  CREATININE 1.56*  CALCIUM  8.6*   No results for input(s): LABPT, INR in the last 72 hours. No results for input(s): LABURIN in the last 72 hours. Results for orders placed or performed during the hospital encounter of 11/10/23  Urine Culture     Status: Abnormal   Collection Time: 11/10/23  8:11 AM   Specimen: Urine, Clean Catch  Result Value Ref Range  Status   Specimen Description   Final    URINE, CLEAN CATCH Performed at [facility], [address] [place], [place] [address]    Special Requests   Final    NONE Performed at [facility], [address] [place], [place] [address]    Culture >=100,000 COLONIES/mL PSEUDOMONAS AERUGINOSA (A)  Final   Report Status 11/12/2023 FINAL  Final   Organism ID, Bacteria PSEUDOMONAS AERUGINOSA (A)  Final      Susceptibility   Pseudomonas aeruginosa - MIC*    CEFTAZIDIME 4 SENSITIVE Sensitive     CIPROFLOXACIN  <=0.25 SENSITIVE Sensitive     GENTAMICIN  <=1 SENSITIVE Sensitive     IMIPENEM 2 SENSITIVE Sensitive     PIP/TAZO <=4 SENSITIVE Sensitive ug/mL    CEFEPIME  2 SENSITIVE Sensitive     * >=100,000 COLONIES/mL PSEUDOMONAS AERUGINOSA    Studies/Results: DG C-Arm 1-60  Min-No Report Result Date: 11/13/2023 Fluoroscopy was utilized by the requesting physician.  No radiographic interpretation.   DG C-Arm 1-60 Min-No Report Result Date: 11/13/2023 Fluoroscopy was utilized by the requesting physician.  No radiographic interpretation.    Assessment: Left renal calculus POD1 s/p L PCNL.  Plan: -foley removed this AM and PCN capped; will plan to remove PCN later today or tomorrow -pain poorly controlled -tolerated regular diet -continue ambulation -home meds -SCDs -anticipate dc tomorrow   Valli Shank, MD Urology 11/14/2023, 5:52 AM

## 2023-11-15 LAB — BASIC METABOLIC PANEL
Anion gap: 3 — ABNORMAL LOW (ref 5–15)
BUN: 28 mg/dL — ABNORMAL HIGH (ref 8–23)
CO2: 22 mmol/L (ref 22–32)
Calcium: 7.8 mg/dL — ABNORMAL LOW (ref 8.9–10.3)
Chloride: 106 mmol/L (ref 98–111)
Creatinine, Ser: 2.09 mg/dL — ABNORMAL HIGH (ref 0.44–1.00)
GFR, Estimated: 26 mL/min — ABNORMAL LOW (ref >60)
Glucose, Bld: 91 mg/dL (ref 70–99)
Potassium: 4.2 mmol/L (ref 3.5–5.1)
Sodium: 131 mmol/L — ABNORMAL LOW (ref 135–145)

## 2023-11-15 LAB — CBC WITH DIFFERENTIAL/PLATELET
Abs Immature Granulocytes: 0.05 10*3/uL (ref 0.00–0.07)
Basophils Absolute: 0 10*3/uL (ref 0.0–0.1)
Basophils Relative: 0 %
Eosinophils Absolute: 0 10*3/uL (ref 0.0–0.5)
Eosinophils Relative: 0 %
HCT: 27.9 % — ABNORMAL LOW (ref 36.0–46.0)
Hemoglobin: 8.8 g/dL — ABNORMAL LOW (ref 12.0–15.0)
Immature Granulocytes: 1 %
Lymphocytes Relative: 12 %
Lymphs Abs: 0.8 10*3/uL (ref 0.7–4.0)
MCH: 30.7 pg (ref 26.0–34.0)
MCHC: 31.5 g/dL (ref 30.0–36.0)
MCV: 97.2 fL (ref 80.0–100.0)
Monocytes Absolute: 0.2 10*3/uL (ref 0.1–1.0)
Monocytes Relative: 2 %
Neutro Abs: 5.6 10*3/uL (ref 1.7–7.7)
Neutrophils Relative %: 85 %
Platelets: 154 10*3/uL (ref 150–400)
RBC: 2.87 MIL/uL — ABNORMAL LOW (ref 3.87–5.11)
RDW: 14.6 % (ref 11.5–15.5)
WBC: 6.6 10*3/uL (ref 4.0–10.5)
nRBC: 0 % (ref 0.0–0.2)

## 2023-11-15 MED ORDER — SODIUM CHLORIDE 0.9 % IV SOLN: INTRAVENOUS | Status: AC

## 2023-11-15 MED ORDER — SODIUM CHLORIDE 0.9 % IV BOLUS
1000.0000 mL | Freq: Once | INTRAVENOUS | Status: AC
  Administered 2023-11-15: 1000 mL via INTRAVENOUS

## 2023-11-15 MED ORDER — SODIUM CHLORIDE 0.9 % IV SOLN
1.0000 g | INTRAVENOUS | Status: DC
  Administered 2023-11-16: 1 g via INTRAVENOUS
  Filled 2023-11-15 (×2): qty 10

## 2023-11-15 NOTE — Plan of Care (Signed)
  Problem: Clinical Measurements: Goal: Ability to maintain clinical measurements within normal limits will improve Outcome: Progressing Goal: Diagnostic test results will improve Outcome: Progressing Goal: Respiratory complications will improve Outcome: Progressing Goal: Cardiovascular complication will be avoided Outcome: Progressing   Problem: Activity: Goal: Risk for activity intolerance will decrease Outcome: Progressing   Problem: Pain Management: Goal: General experience of comfort will improve Outcome: Progressing   Problem: Safety: Goal: Ability to remain free from injury will improve Outcome: Progressing

## 2023-11-15 NOTE — Progress Notes (Signed)
 PHARMACY NOTE:  ANTIMICROBIAL RENAL DOSAGE ADJUSTMENT  Current antimicrobial regimen includes a mismatch between antimicrobial dosage and estimated renal function.  As per policy approved by the Pharmacy & Therapeutics and Medical Executive Committees, the antimicrobial dosage will be adjusted accordingly.  Current antimicrobial dosage:  cefepime  1 g q12h  Indication: UTI  Renal Function:  Estimated Creatinine Clearance: 23.5 mL/min (A) (by C-G formula based on SCr of 2.09 mg/dL (H)). []      On intermittent HD, scheduled: []      On CRRT    Antimicrobial dosage has been changed to:  cefepime  1 g q24h  Additional comments:   Thank you for allowing pharmacy to be a part of this patient's care.  Eleanor Agent, [facility] 11/15/2023 2:19 PM

## 2023-11-15 NOTE — Progress Notes (Signed)
 Urology Inpatient Progress Report  Kidney stone on left side [N20.0]  Procedure(s): LEFT NEPHROLITHOTOMY PERCUTANEOUS  2 Days Post-Op   Intv/Subj: Patient with low grade temp overnight and poor mobility/need for pain meds. No change in pain after capping tube however.  Principal Problem:   Kidney stone on left side  Current Facility-Administered Medications  Medication Dose Route Frequency Provider Last Rate Last Admin   acetaminophen  (TYLENOL ) tablet 650 mg  650 mg Oral Q4H PRN Showalter, Victor C, MD   650 mg at 11/15/23 0458   ceFEPIme  (MAXIPIME ) 1 g in sodium chloride  0.9 % 100 mL IVPB  1 g Intravenous Q12H Showalter, Victor C, MD 200 mL/hr at 11/15/23 0238 1 g at 11/15/23 0238   diphenhydrAMINE  (BENADRYL ) injection 12.5-25 mg  12.5-25 mg Intravenous Q6H PRN Showalter, Victor C, MD       Or   diphenhydrAMINE  (BENADRYL ) 12.5 MG/5ML elixir 12.5-25 mg  12.5-25 mg Oral Q6H PRN Showalter, Victor C, MD       HYDROmorphone  (DILAUDID ) injection 0.5 mg  0.5 mg Intravenous Q3H PRN Showalter, Victor C, MD       hyoscyamine  (LEVSIN  SL) SL tablet 0.125 mg  0.125 mg Sublingual Q4H PRN Showalter, Victor C, MD       melatonin tablet 5 mg  5 mg Oral QHS PRN Showalter, Victor C, MD   5 mg at 11/13/23 2223   methocarbamol  (ROBAXIN ) tablet 1,000 mg  1,000 mg Oral QID Showalter, Victor C, MD   1,000 mg at 11/15/23 0931   octreotide  (SANDOSTATIN ) injection 50 mcg  50 mcg Subcutaneous Q8H Showalter, Victor C, MD   50 mcg at 11/15/23 0639   ondansetron  (ZOFRAN ) injection 4 mg  4 mg Intravenous Q4H PRN Showalter, Victor C, MD   4 mg at 11/14/23 1659   oxyCODONE  (Oxy IR/ROXICODONE ) immediate release tablet 5 mg  5 mg Oral Q4H PRN Showalter, Victor C, MD   5 mg at 11/15/23 0935   sodium bicarbonate  tablet 650 mg  650 mg Oral BID Shane Steffan BROCKS, MD   650 mg at 11/15/23 0931     Objective: Vital: Vitals:   11/15/23 0442 11/15/23 0511 11/15/23 0641 11/15/23 0643  BP: (!) 91/56 (!) 90/55 (!) 91/54  92/67  Pulse: 86 85 71 67  Resp:      Temp: 100.1 F (37.8 C)  98.3 F (36.8 C)   TempSrc: Oral  Oral   SpO2: 96%      I/Os: I/O last 3 completed shifts: In: 1140.1 [P.O.:840; IV Piggyback:300.1] Out: 2260 [Urine:1660; Stool:600]  Physical Exam:  General: Patient is in no apparent distress Lungs: Normal respiratory effort, chest expands symmetrically. GU: PCN tube capped yesterday, removed today during rounds, site c/d/i Foley: previously removed  Ext: lower extremities symmetric  Lab Results: Recent Labs    11/13/23 1710 11/14/23 0435  WBC 17.5* 18.5*  HGB 10.1* 9.5*  HCT 33.1* 30.2*   Recent Labs    11/13/23 1542 11/14/23 0435  NA 136 137  K 5.6* 4.8  CL 111 107  CO2 16* 21*  GLUCOSE 106* 100*  BUN 17 23  CREATININE 1.56* 1.89*  CALCIUM  8.6* 8.4*   No results for input(s): LABPT, INR in the last 72 hours. No results for input(s): LABURIN in the last 72 hours. Results for orders placed or performed during the hospital encounter of 11/10/23  Urine Culture     Status: Abnormal   Collection Time: 11/10/23  8:11 AM   Specimen: Urine, Clean Catch  Result Value Ref Range Status   Specimen Description   Final    URINE, CLEAN CATCH Performed at [facility], [address], [place], [place] [address]    Special Requests   Final    NONE Performed at [facility], [address], [place], [place] [address]    Culture >=100,000 COLONIES/mL PSEUDOMONAS AERUGINOSA (A)  Final   Report Status 11/12/2023 FINAL  Final   Organism ID, Bacteria PSEUDOMONAS AERUGINOSA (A)  Final      Susceptibility   Pseudomonas aeruginosa - MIC*    CEFTAZIDIME 4 SENSITIVE Sensitive     CIPROFLOXACIN  <=0.25 SENSITIVE Sensitive     GENTAMICIN  <=1 SENSITIVE Sensitive     IMIPENEM 2 SENSITIVE Sensitive     PIP/TAZO <=4 SENSITIVE Sensitive ug/mL    CEFEPIME  2 SENSITIVE Sensitive     * >=100,000 COLONIES/mL PSEUDOMONAS AERUGINOSA     Studies/Results: DG C-Arm 1-60 Min-No Report Result Date: 11/13/2023 Fluoroscopy was utilized by the requesting physician.  No radiographic interpretation.   DG C-Arm 1-60 Min-No Report Result Date: 11/13/2023 Fluoroscopy was utilized by the requesting physician.  No radiographic interpretation.    Assessment: Procedure(s): LEFT NEPHROLITHOTOMY PERCUTANEOUS, 2 Days Post-Op  with low grade fever and poor mobility.  Plan: -PCN tube had been capped all day without change in pain, removed this AM -will work on mobility and pain control today -CBC and BMP -continue cefepime  -home meds -no anticipated dc today   Valli Shank, MD Urology 11/15/2023, 10:25 AM

## 2023-11-15 NOTE — Progress Notes (Signed)
 MD notified of patients low BP.  Remained green MEWS  Orders for bolus and IV fluids given and initiated.  Patient denies symptoms at this time.

## 2023-11-16 ENCOUNTER — Encounter (HOSPITAL_COMMUNITY): Payer: Self-pay | Admitting: Urology

## 2023-11-16 LAB — CBC WITH DIFFERENTIAL/PLATELET
Abs Immature Granulocytes: 0.03 10*3/uL (ref 0.00–0.07)
Basophils Absolute: 0 10*3/uL (ref 0.0–0.1)
Basophils Relative: 0 %
Eosinophils Absolute: 0.2 10*3/uL (ref 0.0–0.5)
Eosinophils Relative: 3 %
HCT: 29.2 % — ABNORMAL LOW (ref 36.0–46.0)
Hemoglobin: 8.8 g/dL — ABNORMAL LOW (ref 12.0–15.0)
Immature Granulocytes: 1 %
Lymphocytes Relative: 20 %
Lymphs Abs: 1.2 10*3/uL (ref 0.7–4.0)
MCH: 29.8 pg (ref 26.0–34.0)
MCHC: 30.1 g/dL (ref 30.0–36.0)
MCV: 99 fL (ref 80.0–100.0)
Monocytes Absolute: 0.5 10*3/uL (ref 0.1–1.0)
Monocytes Relative: 8 %
Neutro Abs: 4.1 10*3/uL (ref 1.7–7.7)
Neutrophils Relative %: 68 %
Platelets: 115 10*3/uL — ABNORMAL LOW (ref 150–400)
RBC: 2.95 MIL/uL — ABNORMAL LOW (ref 3.87–5.11)
RDW: 14.4 % (ref 11.5–15.5)
WBC: 5.9 10*3/uL (ref 4.0–10.5)
nRBC: 0 % (ref 0.0–0.2)

## 2023-11-16 LAB — BASIC METABOLIC PANEL
Anion gap: 7 (ref 5–15)
Anion gap: 7 (ref 5–15)
BUN: 28 mg/dL — ABNORMAL HIGH (ref 8–23)
BUN: 27 mg/dL — ABNORMAL HIGH (ref 8–23)
CO2: 19 mmol/L — ABNORMAL LOW (ref 22–32)
CO2: 21 mmol/L — ABNORMAL LOW (ref 22–32)
Calcium: 7.9 mg/dL — ABNORMAL LOW (ref 8.9–10.3)
Calcium: 7.7 mg/dL — ABNORMAL LOW (ref 8.9–10.3)
Chloride: 108 mmol/L (ref 98–111)
Chloride: 106 mmol/L (ref 98–111)
Creatinine, Ser: 2.13 mg/dL — ABNORMAL HIGH (ref 0.44–1.00)
Creatinine, Ser: 1.86 mg/dL — ABNORMAL HIGH (ref 0.44–1.00)
GFR, Estimated: 25 mL/min — ABNORMAL LOW (ref >60)
GFR, Estimated: 30 mL/min — ABNORMAL LOW (ref >60)
Glucose, Bld: 76 mg/dL (ref 70–99)
Glucose, Bld: 83 mg/dL (ref 70–99)
Potassium: 4.8 mmol/L (ref 3.5–5.1)
Potassium: 4.3 mmol/L (ref 3.5–5.1)
Sodium: 134 mmol/L — ABNORMAL LOW (ref 135–145)
Sodium: 134 mmol/L — ABNORMAL LOW (ref 135–145)

## 2023-11-16 MED ORDER — LACTATED RINGERS IV BOLUS
1000.0000 mL | Freq: Once | INTRAVENOUS | Status: AC
  Administered 2023-11-16: 1000 mL via INTRAVENOUS

## 2023-11-16 NOTE — Progress Notes (Signed)
 3 Days Post-Op Subjective: Patient doing well, no N/V. Patient ambulated to bathroom. Pain has improved only used oral medication overnight.   Objective: Vital signs in last 24 hours: Temp:  [97.6 F (36.4 C)-98.7 F (37.1 C)] 97.6 F (36.4 C) (01/13 0552) Pulse Rate:  [67-82] 82 (01/13 0552) Resp:  [16-18] 16 (01/13 0552) BP: (85-127)/(54-80) 127/66 (01/13 0552) SpO2:  [98 %-100 %] 100 % (01/13 0552)  Intake/Output from previous day: 01/12 0701 - 01/13 0700 In: 1461.6 [P.O.:480; I.V.:108.2; IV Piggyback:873.5] Out: -  Intake/Output this shift: No intake/output data recorded.  Physical Exam:  General: Patient is in no apparent distress Lungs: Normal respiratory effort, chest expands symmetrically. GI: ostomy in place with output present, abdomen soft.  GU: PCN tube removed yesterday, sight with no bleeding, no ecchymosis around the tube site.      Lab Results: Recent Labs    11/13/23 1710 11/14/23 0435 11/15/23 1156  HGB 10.1* 9.5* 8.8*  HCT 33.1* 30.2* 27.9*   BMET Recent Labs    11/14/23 0435 11/15/23 1156  NA 137 131*  K 4.8 4.2  CL 107 106  CO2 21* 22  GLUCOSE 100* 91  BUN 23 28*  CREATININE 1.89* 2.09*  CALCIUM  8.4* 7.8*     Studies/Results: No results found.  Assessment/Plan: LEFT NEPHROLITHOTOMY PERCUTANEOUS, 3 Days Post-Op  with low grade fever and poor mobility.  - ambulate today - pending labs  - neph tube removed 11/15/23 - continue regular diet  - UCX positive prior to PCNL continue cefepime   High ostomy output  - continue octreotide  3. AKI - bolus 1 L yesterday  - pending labs this AM   Possible DC later today    LOS: 2 days   Jackey Pea MD 11/16/2023, 6:32 AM [facility]

## 2023-11-16 NOTE — Plan of Care (Signed)
  Problem: Activity: Goal: Risk for activity intolerance will decrease Outcome: Progressing   Problem: Nutrition: Goal: Adequate nutrition will be maintained Outcome: Progressing   Problem: Coping: Goal: Level of anxiety will decrease Outcome: Progressing   Problem: Elimination: Goal: Will not experience complications related to bowel motility Outcome: Progressing Goal: Will not experience complications related to urinary retention Outcome: Progressing   Problem: Pain Management: Goal: General experience of comfort will improve Outcome: Progressing   Problem: Safety: Goal: Ability to remain free from injury will improve Outcome: Progressing   Problem: Skin Integrity: Goal: Risk for impaired skin integrity will decrease Outcome: Progressing

## 2023-11-16 NOTE — Evaluation (Signed)
 Physical Therapy Evaluation Patient Details Name: Cynthia [name] MRN: [id number] DOB: [birth year] Today's Date: 11/16/2023  History of Present Illness  63 year old female  withPMH:  Crohn's disease, recurrent UTIs, CKD who initially presented on 10/10/2023 for left-sided pyelo as well as large renal pelvis stone.  Patient's status post IR nephrostomy tube placement on 10/10/2023.  She was admitted today after preop urine culture demonstrated Pseudomonas pansensitive, culture resulted on 11/11/2023.  Plan is for 24 hours of IV antibiotics with PCNL .s/p LEFT NEPHROLITHOTOMY PERCUTANEOUS  Clinical Impression  Patient evaluated by Physical Therapy with no further acute PT needs identified. All education has been completed and the patient has no further questions.  Pt is quite independent at her baseline, present today independent with bed mobility, transfers and ambulation in hallway. Discussed gradual incr activity at d/c. No further PT indicated at this time. Pt in agreement with plan  See below for any follow-up Physical Therapy or equipment needs. PT is signing off. Thank you for this referral.         If plan is discharge home, recommend the following:     Can travel by private vehicle        Equipment Recommendations None recommended by PT  Recommendations for Other Services       Functional Status Assessment Patient has not had a recent decline in their functional status     Precautions / Restrictions Precautions Precautions: Fall Restrictions Weight Bearing Restrictions Per Provider Order: No      Mobility  Bed Mobility Overal bed mobility: Modified Independent                  Transfers Overall transfer level: Independent                      Ambulation/Gait Ambulation/Gait assistance: Independent Gait Distance (Feet):  (15' more) Assistive device: None Gait Pattern/deviations: WFL(Within Functional Limits)          Stairs             Wheelchair Mobility     Tilt Bed    Modified Rankin (Stroke Patients Only)       Balance Overall balance assessment: No apparent balance deficits (not formally assessed)                                           Pertinent Vitals/Pain Pain Assessment Pain Assessment: Faces Pain Location: back Pain Descriptors / Indicators: Sore Pain Intervention(s): Repositioned, Monitored during session    Home Living Family/patient expects to be discharged to:: Private residence Living Arrangements: Alone   Type of Home: Mobile home Home Access: Stairs to enter Entrance Stairs-Rails: Can reach both;Right;Left   Alternate Level Stairs-Number of Steps: pt reports stairs to bedroom/bathroom  ~ 3-4---does not have to use this bathroom per pt she goes outside often/ in the woods Home Layout: Two level Home Equipment: None      Prior Function Prior Level of Function : Independent/Modified Independent                     Extremity/Trunk Assessment   Upper Extremity Assessment Upper Extremity Assessment: Overall WFL for tasks assessed    Lower Extremity Assessment Lower Extremity Assessment: Overall WFL for tasks assessed       Communication   Communication Communication: No apparent difficulties  Cognition Arousal: Alert Behavior During  Therapy: WFL for tasks assessed/performed Overall Cognitive Status: Within Functional Limits for tasks assessed                                          General Comments      Exercises     Assessment/Plan    PT Assessment Patient does not need any further PT services  PT Problem List         PT Treatment Interventions      PT Goals (Current goals can be found in the Care Plan section)  Acute Rehab PT Goals PT Goal Formulation: All assessment and education complete, DC therapy    Frequency       Co-evaluation               AM-PAC PT 6 Clicks Mobility  Outcome Measure  Help needed turning from your back to your side while in a flat bed without using bedrails?: None Help needed moving from lying on your back to sitting on the side of a flat bed without using bedrails?: None Help needed moving to and from a bed to a chair (including a wheelchair)?: None Help needed standing up from a chair using your arms (e.g., wheelchair or bedside chair)?: None Help needed to walk in hospital room?: None Help needed climbing 3-5 steps with a railing? : None 6 Click Score: 24    End of Session Equipment Utilized During Treatment: Gait belt Activity Tolerance: Patient tolerated treatment well Patient left: in bed;with call bell/phone within reach   PT Visit Diagnosis: Other abnormalities of gait and mobility (R26.89)    Time: 1430-1447 PT Time Calculation (min) (ACUTE ONLY): 17 min   Charges:   PT Evaluation $PT Eval Low Complexity: 1 Low   PT General Charges $$ ACUTE PT VISIT: 1 Visit         Devion Chriscoe, PT  Acute Rehab Dept [facility]) [phone]  11/16/2023   [facility] 11/16/2023, 3:56 PM

## 2023-11-16 NOTE — Anesthesia Postprocedure Evaluation (Signed)
 Anesthesia Post Note  Patient: Cynthia [name]  Procedure(s) Performed: LEFT NEPHROLITHOTOMY PERCUTANEOUS (Left: Renal)     Patient location during evaluation: PACU Anesthesia Type: General Level of consciousness: awake and alert Pain management: pain level controlled Vital Signs Assessment: post-procedure vital signs reviewed and stable Respiratory status: spontaneous breathing, nonlabored ventilation, respiratory function stable and patient connected to nasal cannula oxygen Cardiovascular status: blood pressure returned to baseline and stable Postop Assessment: no apparent nausea or vomiting Anesthetic complications: no   No notable events documented.  Last Vitals:    Last Pain:   Pain Goal:                  Cynthia [name]

## 2023-11-16 NOTE — Progress Notes (Signed)
 PT doing well. Kidney function improved offered DC to night. Patient does not have ride would like to DC in AM.

## 2023-11-17 ENCOUNTER — Ambulatory Visit: Payer: Medicare Other

## 2023-11-17 LAB — BASIC METABOLIC PANEL
Anion gap: 5 (ref 5–15)
BUN: 27 mg/dL — ABNORMAL HIGH (ref 8–23)
CO2: 21 mmol/L — ABNORMAL LOW (ref 22–32)
Calcium: 7.8 mg/dL — ABNORMAL LOW (ref 8.9–10.3)
Chloride: 109 mmol/L (ref 98–111)
Creatinine, Ser: 1.75 mg/dL — ABNORMAL HIGH (ref 0.44–1.00)
GFR, Estimated: 32 mL/min — ABNORMAL LOW (ref >60)
Glucose, Bld: 86 mg/dL (ref 70–99)
Potassium: 4.6 mmol/L (ref 3.5–5.1)
Sodium: 135 mmol/L (ref 135–145)

## 2023-11-17 MED ORDER — CIPROFLOXACIN HCL 500 MG PO TABS: 500.0000 mg | ORAL_TABLET | Freq: Every day | ORAL | 0 refills | Status: AC

## 2023-11-17 MED ORDER — OXYCODONE HCL 5 MG PO TABS: 5.0000 mg | ORAL_TABLET | Freq: Four times a day (QID) | ORAL | 0 refills | Status: AC | PRN

## 2023-11-17 MED ORDER — OCTREOTIDE ACETATE 50 MCG/ML IJ SOLN: 50.0000 ug | Freq: Two times a day (BID) | INTRAMUSCULAR | 0 refills | Status: AC

## 2023-11-17 NOTE — Progress Notes (Signed)
 Discharge instructions reviewed with patient , including medications and dressing changes. All questions answered. All belongings accounted for. Patient to follow up with MD in  1 weeks. Patient medications sent to the [facility] on [address]. PIV removed. Assisted via WC to private vehicle.

## 2023-11-17 NOTE — TOC Transition Note (Signed)
 Transition of Care [facility]) - Discharge Note   Patient Details  Name: Cynthia [name] MRN: [id number] Date of Birth: 1960/12/16  Transition of [facility]) CM/SW Contact:  Tawni CHRISTELLA Eva, LCSW Phone Number: 11/17/2023, 9:23 AM   Clinical Narrative:     [facility] consult for transportation , per RN pt will call a ride or  get an uber.  No further TOC needs , TOC sign off.     Barriers to Discharge: Barriers Resolved   Patient Goals and CMS Choice Patient states their goals for this hospitalization and ongoing recovery are:: retun home          Discharge Placement                       Discharge Plan and Services Additional resources added to the After Visit Summary for                                       Social Drivers of Health (SDOH) Interventions SDOH Screenings   Food Insecurity: No Food Insecurity (11/12/2023)  Recent Concern: Food Insecurity - Food Insecurity Present (11/09/2023)   Received from [facility], [facility]  Housing: Low Risk  (11/13/2023)  Recent Concern: Housing - High Risk (11/09/2023)   Received from [facility], [facility]  Transportation Needs: No Transportation Needs (11/17/2023)  Recent Concern: Transportation Needs - Unmet Transportation Needs (11/17/2023)  Utilities: At Risk (11/12/2023)  Depression (PHQ2-9): Low Risk  (09/29/2023)  Financial Resource Strain: Low Risk  (11/09/2023)   Received from [facility], [facility]  Physical Activity: Unknown (11/09/2023)   Received from [facility], [facility]  Social Connections: Moderately Integrated (11/09/2023)   [facility]  Stress: Stress Concern Present (11/09/2023)   [facility]  Tobacco Use: Medium Risk (11/13/2023)     Readmission Risk Interventions    08/20/2021    1:07 PM 04/09/2021    9:32 AM  Readmission Risk Prevention Plan  Transportation Screening Complete Complete  Medication Review (RN  Care Manager) Complete Complete  PCP or Specialist appointment within 3-5 days of discharge Complete Complete  HRI or Home Care Consult Complete Complete  SW Recovery Care/Counseling Consult Complete Complete  Palliative Care Screening Not Applicable Not Applicable  Skilled Nursing Facility Not Applicable Not Applicable

## 2023-11-17 NOTE — Plan of Care (Signed)
  Problem: Health Behavior/Discharge Planning: Goal: Ability to manage health-related needs will improve Outcome: Completed/Met   Problem: Clinical Measurements: Goal: Ability to maintain clinical measurements within normal limits will improve Outcome: Completed/Met Goal: Will remain free from infection Outcome: Completed/Met Goal: Diagnostic test results will improve Outcome: Completed/Met Goal: Respiratory complications will improve Outcome: Completed/Met Goal: Cardiovascular complication will be avoided Outcome: Completed/Met   Problem: Activity: Goal: Risk for activity intolerance will decrease Outcome: Completed/Met   Problem: Nutrition: Goal: Adequate nutrition will be maintained Outcome: Completed/Met   Problem: Coping: Goal: Level of anxiety will decrease Outcome: Completed/Met   Problem: Elimination: Goal: Will not experience complications related to bowel motility Outcome: Completed/Met Goal: Will not experience complications related to urinary retention Outcome: Completed/Met   Problem: Pain Management: Goal: General experience of comfort will improve Outcome: Completed/Met   Problem: Safety: Goal: Ability to remain free from injury will improve Outcome: Completed/Met   Problem: Skin Integrity: Goal: Risk for impaired skin integrity will decrease Outcome: Completed/Met

## 2023-11-17 NOTE — Discharge Summary (Signed)
 Date of admission: 11/12/2023  Date of discharge: 11/17/2023  Admission diagnosis: Left renal stone   Discharge diagnosis: Left renal stone   Secondary diagnoses:  Patient Active Problem List   Diagnosis Date Noted   Kidney stone on left side 11/12/2023   UTI (urinary tract infection) 11/03/2023   AKI (acute kidney injury) (HCC) 11/02/2023   Contamination of blood culture 10/11/2023   Obstruction of left ureteropelvic junction (UPJ) due to stone 10/10/2023   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   Acute cystitis with hematuria 10/10/2023   CKD (chronic kidney disease) stage 3, GFR 30-59 ml/min (HCC) 04/13/2022   Anxiety 04/13/2022   Irritant contact dermatitis associated with fecal stoma 04/13/2022   Hypoglycemia 12/25/2021   Vitamin B deficiency, unspecified 10/24/2021   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   High anion gap metabolic acidosis 07/10/2021   Nausea and vomiting 06/26/2021   Adjustment disorder 03/14/2021   Unspecified severe protein-calorie malnutrition (HCC) 03/13/2021   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   History of rectal polyp 2020 01/12/2021   Anxiety associated with depression 01/12/2021   High risk medication use 11/27/2020   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 Briefly, Cynthia [name] is a 63 y.o. year old patient with hx of L renal stone and pyelonephritis requiring nephrostomy tube in early  December she was admitted post PCNL.   Hospital Course: Patient tolerated the procedure well.  She was then transferred to the floor after an uneventful PACU stay.  Her hospital course was uncomplicated.  On POD#4 she had met discharge criteria: was eating a regular diet, was up and ambulating independently,  pain was well controlled, was voiding without a catheter, and was ready to for discharge.  Hospital Problems: # PCNL: Patient foley was removed POD1 and nephrostomy tube capped POD2. Her Labs had stabilized by POD3. Pain was well tolerated with oral meds by POD2.   # AKI: patient developed prerenal AKI that resolved with fluid resuscitation. Octreotide  controlled GI fluid losses.   # High output ostomy: managed with octreotide    Physical Exam: Gen: NAD ambulating during interview Resp: normal WOB on RA Cards: RRR per monitor  Back: tube site covered with dressing, no leakage, no eccymosis, no bleeding  GU: no catheter .    Laboratory values:  Recent Labs    11/15/23 1156 11/16/23 0707  WBC 6.6 5.9  HGB 8.8* 8.8*  HCT 27.9* 29.2*   Recent Labs    11/16/23 0707 11/16/23 1430 11/17/23 0408  NA 134* 134* 135  K 4.8 4.3 4.6  CL 108 106 109  CO2 19* 21* 21*  GLUCOSE 76 83 86  BUN 28* 27* 27*  CREATININE 2.13* 1.86* 1.75*  CALCIUM  7.9* 7.7* 7.8*   No results for input(s): LABPT, INR in the last 72 hours. No results for input(s): LABURIN in the last 72 hours. Results for orders placed or performed during the hospital encounter of 11/10/23  Urine Culture     Status: Abnormal   Collection Time: 11/10/23  8:11 AM   Specimen: Urine, Clean Catch  Result Value Ref Range Status   Specimen Description   Final    URINE, CLEAN CATCH Performed at [name], [address], [place], [place] [address]    Special Requests   Final    NONE Performed at [facility], [address], [place], [place] [address]    Culture >=100,000  COLONIES/mL PSEUDOMONAS AERUGINOSA (A)  Final   Report Status 11/12/2023 FINAL  Final   Organism ID, Bacteria PSEUDOMONAS AERUGINOSA (A)  Final      Susceptibility   Pseudomonas aeruginosa - MIC*    CEFTAZIDIME 4 SENSITIVE Sensitive     CIPROFLOXACIN  <=0.25 SENSITIVE Sensitive     GENTAMICIN  <=1 SENSITIVE Sensitive     IMIPENEM 2 SENSITIVE Sensitive     PIP/TAZO <=4 SENSITIVE Sensitive ug/mL    CEFEPIME  2 SENSITIVE Sensitive     * >=100,000 COLONIES/mL PSEUDOMONAS AERUGINOSA    Disposition: Home  Discharge instruction: The patient was instructed to be ambulatory but told to refrain from heavy lifting, strenuous activity, or driving.   Discharge medications:  Allergies as of 11/17/2023       Reactions   Nsaids Other (See Comments)   Crohn's disease & CKD   Ace Inhibitors Cough   Iodine Other (See Comments)   The patient said she had a reaction from iodine in some eye drops- caused eyes to turn red;  pt has no reactions to CT contrast        Medication List     TAKE these medications    ciprofloxacin  500 MG tablet Commonly known as: Cipro  Take 1 tablet (500 mg total) by mouth daily with breakfast for 7 days.   hyoscyamine  0.125 MG Tbdp disintergrating tablet Commonly known as: ANASPAZ  Place 1 tablet (0.125 mg total) under the tongue every 6 (six) hours as needed for up to 20 doses for bladder spasms.   loperamide  2 MG capsule Commonly known as: IMODIUM  Take 1 capsule (2 mg total) by mouth 3 (three) times daily as needed for diarrhea or loose stools.   methocarbamol  750 MG tablet Commonly known as: ROBAXIN  Take 1 tablet (750 mg total) by mouth 4 (four) times daily for 30 doses.   Octreotide  Acetate 50 MCG/ML Sosy Inject 1 ml (50mcg) into the skin every 8 hours. What changed: Another medication with the same name was added. Make sure you understand how and when to take each.   octreotide  50 MCG/ML Soln injection Commonly known as: SANDOSTATIN  Inject 1 mL (50 mcg  total) into the skin every 12 (twelve) hours for 10 doses. What changed: You were already taking a medication with the same name, and this prescription was added. Make sure you understand how and when to take each.   senna-docusate 8.6-50 MG tablet Commonly known as: Senokot-S Take 1 tablet by mouth 2 (two) times daily as needed for mild constipation.   sodium bicarbonate  650 MG tablet Take 650 mg by mouth 2 (two) times daily.   Stelara 90 MG/ML Sosy injection Generic drug: ustekinumab Inject 90 mg into the skin every 3 (three) months.   tamsulosin  0.4 MG Caps capsule Commonly known as: FLOMAX  Take 1 capsule (0.4 mg total) by mouth daily after supper.       ASK your doctor about these medications    oxyCODONE  5 MG immediate release tablet Commonly known as: Roxicodone  Take 1 tablet (5 mg total) by mouth every 6 (six) hours as needed for  up to 3 days. Ask about: Should I take this medication?               Discharge Care Instructions  (From admission, onward)           Start     Ordered   11/17/23 0000  Change dressing (specify)       Comments: Dressing change: 1 times per day using gauze and metapore tape. SABRA   11/17/23 0700            Followup:   Follow-up Information     Shane Steffan BROCKS, MD Follow up on 11/26/2023.   Specialty: Urology Why: Cystoscopy stent removal at 12:45PM Contact information: [address] [place] [address] [phone]

## 2023-11-20 LAB — CALCULI, WITH PHOTOGRAPH (CLINICAL LAB)
Ammonium Acid Urate Calculi: 70 %
Carbonate Apatite: 10 %
Mg NH4 PO4 (Struvite): 20 %
Weight Calculi: 1261 mg

## 2023-11-27 DIAGNOSIS — K50013 Crohn's disease of small intestine with fistula: Secondary | ICD-10-CM | POA: Diagnosis not present

## 2023-12-29 ENCOUNTER — Encounter: Payer: Self-pay | Admitting: Internal Medicine

## 2024-01-22 DIAGNOSIS — F17290 Nicotine dependence, other tobacco product, uncomplicated: Secondary | ICD-10-CM | POA: Diagnosis not present

## 2024-01-22 DIAGNOSIS — K219 Gastro-esophageal reflux disease without esophagitis: Secondary | ICD-10-CM | POA: Diagnosis not present

## 2024-01-22 DIAGNOSIS — Z932 Ileostomy status: Secondary | ICD-10-CM | POA: Diagnosis not present

## 2024-01-22 DIAGNOSIS — K5090 Crohn's disease, unspecified, without complications: Secondary | ICD-10-CM | POA: Diagnosis not present

## 2024-01-22 DIAGNOSIS — K660 Peritoneal adhesions (postprocedural) (postinfection): Secondary | ICD-10-CM | POA: Diagnosis not present

## 2024-01-22 DIAGNOSIS — Z8379 Family history of other diseases of the digestive system: Secondary | ICD-10-CM | POA: Diagnosis not present

## 2024-01-22 DIAGNOSIS — N183 Chronic kidney disease, stage 3 unspecified: Secondary | ICD-10-CM | POA: Diagnosis not present

## 2024-01-22 DIAGNOSIS — Z01812 Encounter for preprocedural laboratory examination: Secondary | ICD-10-CM | POA: Diagnosis not present

## 2024-01-22 DIAGNOSIS — G8918 Other acute postprocedural pain: Secondary | ICD-10-CM | POA: Diagnosis not present

## 2024-01-22 DIAGNOSIS — I10 Essential (primary) hypertension: Secondary | ICD-10-CM | POA: Diagnosis not present

## 2024-01-22 DIAGNOSIS — Z886 Allergy status to analgesic agent status: Secondary | ICD-10-CM | POA: Diagnosis not present

## 2024-01-22 DIAGNOSIS — K50013 Crohn's disease of small intestine with fistula: Secondary | ICD-10-CM | POA: Diagnosis not present

## 2024-01-22 DIAGNOSIS — Z8616 Personal history of COVID-19: Secondary | ICD-10-CM | POA: Diagnosis not present

## 2024-01-22 DIAGNOSIS — N189 Chronic kidney disease, unspecified: Secondary | ICD-10-CM | POA: Diagnosis not present

## 2024-01-22 DIAGNOSIS — Z888 Allergy status to other drugs, medicaments and biological substances status: Secondary | ICD-10-CM | POA: Diagnosis not present

## 2024-01-22 DIAGNOSIS — D62 Acute posthemorrhagic anemia: Secondary | ICD-10-CM | POA: Diagnosis not present

## 2024-01-22 DIAGNOSIS — N390 Urinary tract infection, site not specified: Secondary | ICD-10-CM | POA: Diagnosis not present

## 2024-01-22 DIAGNOSIS — B965 Pseudomonas (aeruginosa) (mallei) (pseudomallei) as the cause of diseases classified elsewhere: Secondary | ICD-10-CM | POA: Diagnosis not present

## 2024-01-22 DIAGNOSIS — Z432 Encounter for attention to ileostomy: Secondary | ICD-10-CM | POA: Diagnosis not present

## 2024-01-22 DIAGNOSIS — I341 Nonrheumatic mitral (valve) prolapse: Secondary | ICD-10-CM | POA: Diagnosis not present

## 2024-01-22 DIAGNOSIS — Z466 Encounter for fitting and adjustment of urinary device: Secondary | ICD-10-CM | POA: Diagnosis not present

## 2024-01-22 DIAGNOSIS — I129 Hypertensive chronic kidney disease with stage 1 through stage 4 chronic kidney disease, or unspecified chronic kidney disease: Secondary | ICD-10-CM | POA: Diagnosis not present

## 2024-01-22 DIAGNOSIS — Z79899 Other long term (current) drug therapy: Secondary | ICD-10-CM | POA: Diagnosis not present

## 2024-01-25 DIAGNOSIS — Z932 Ileostomy status: Secondary | ICD-10-CM | POA: Diagnosis not present

## 2024-01-25 DIAGNOSIS — G8918 Other acute postprocedural pain: Secondary | ICD-10-CM | POA: Diagnosis not present

## 2024-01-25 DIAGNOSIS — Z466 Encounter for fitting and adjustment of urinary device: Secondary | ICD-10-CM | POA: Diagnosis not present

## 2024-01-25 DIAGNOSIS — K5090 Crohn's disease, unspecified, without complications: Secondary | ICD-10-CM | POA: Diagnosis not present

## 2024-02-01 ENCOUNTER — Telehealth: Payer: Self-pay | Admitting: *Deleted

## 2024-02-01 NOTE — Transitions of Care (Post Inpatient/ED Visit) (Signed)
   02/01/2024  Name: Cynthia [name] MRN: [id number] DOB: 06-24-1961  Today's TOC FU Call Status: Today's TOC FU Call Status:: Unsuccessful Call (1st Attempt) Unsuccessful Call (1st Attempt) Date: 02/01/24  Attempted to reach the patient regarding the most recent Inpatient/ED visit. Mobile number is not in service Follow Up Plan: Additional outreach attempts will be made to reach the patient to complete the Transitions of Care (Post Inpatient/ED visit) call.   [name] BSN RN [facility] Health Care Management Coordinator [email or web address] Direct Dial: [phone]  Fax: [phone] Website: [email or web address]

## 2024-02-02 ENCOUNTER — Telehealth: Payer: Self-pay | Admitting: *Deleted

## 2024-02-02 NOTE — Transitions of Care (Post Inpatient/ED Visit) (Signed)
   02/02/2024  Name: Cynthia [name] MRN: [id number] DOB: Feb 10, 1961  Today's TOC FU Call Status: Today's TOC FU Call Status:: Unsuccessful Call (2nd Attempt) Unsuccessful Call (2nd Attempt) Date: 02/02/24  Attempted to reach the patient regarding the most recent Inpatient/ED visit.  Follow Up Plan: Additional outreach attempts will be made to reach the patient to complete the Transitions of Care (Post Inpatient/ED visit) call.   [name] BSN RN [facility] Health Care Management Coordinator [email or web address] Direct Dial: [phone]  Fax: [phone] Website: [email or web address]

## 2024-02-03 ENCOUNTER — Telehealth: Payer: Self-pay

## 2024-02-03 NOTE — Transitions of Care (Post Inpatient/ED Visit) (Signed)
   02/03/2024  Name: SUNSHYNE HORVATH MRN: [id number] DOB: [birth year]  Today's TOC FU Call Status: Today's TOC FU Call Status:: Unsuccessful Call (3rd Attempt) Unsuccessful Call (3rd Attempt) Date: 02/03/24  Attempted to reach the patient regarding the most recent Inpatient/ED visit.  Follow Up Plan: No further outreach attempts will be made at this time. We have been unable to contact the patient.   Gabriel Cirri MSN, RN RN Case Sales executive Health  [facility] Office Hours Wed/Thur  8:00 am-6:00 pm Direct Dial: [phone] Main Phone [phone]  Fax: [phone] [email or web address]

## 2024-08-18 ENCOUNTER — Other Ambulatory Visit (HOSPITAL_COMMUNITY): Payer: Self-pay

## 2024-09-15 ENCOUNTER — Telehealth: Payer: Self-pay

## 2024-09-15 NOTE — Telephone Encounter (Signed)
 Called patient and schedule appt with Dr. Joyce on 10/04/2024 @2 :30

## 2024-10-04 ENCOUNTER — Ambulatory Visit: Admitting: Family Medicine

## 2024-10-05 ENCOUNTER — Encounter: Payer: Self-pay | Admitting: Family Medicine

## 2024-10-05 ENCOUNTER — Ambulatory Visit: Admitting: Family Medicine

## 2024-10-05 VITALS — BP 148/100 | HR 80 | Ht 64.0 in | Wt 130.2 lb

## 2024-10-05 DIAGNOSIS — Z87442 Personal history of urinary calculi: Secondary | ICD-10-CM | POA: Diagnosis not present

## 2024-10-05 DIAGNOSIS — Z23 Encounter for immunization: Secondary | ICD-10-CM

## 2024-10-05 DIAGNOSIS — Z87891 Personal history of nicotine dependence: Secondary | ICD-10-CM | POA: Insufficient documentation

## 2024-10-05 DIAGNOSIS — K50012 Crohn's disease of small intestine with intestinal obstruction: Secondary | ICD-10-CM | POA: Diagnosis not present

## 2024-10-05 DIAGNOSIS — D84821 Immunodeficiency due to drugs: Secondary | ICD-10-CM | POA: Diagnosis not present

## 2024-10-05 DIAGNOSIS — Z79899 Other long term (current) drug therapy: Secondary | ICD-10-CM

## 2024-10-05 DIAGNOSIS — Z13220 Encounter for screening for lipoid disorders: Secondary | ICD-10-CM | POA: Diagnosis not present

## 2024-10-05 DIAGNOSIS — Z933 Colostomy status: Secondary | ICD-10-CM | POA: Diagnosis not present

## 2024-10-05 MED ORDER — OCTREOTIDE ACETATE 100 MCG/ML IJ SOLN: 100.0000 ug | Freq: Three times a day (TID) | INTRAMUSCULAR | 1 refills | Status: AC

## 2024-10-05 NOTE — Progress Notes (Unsigned)
   Subjective:    Patient ID: Cynthia [name], female    DOB: 09-Jun-1961, 63 y.o.   MRN: [id number]  Discussed the use of AI scribe software for clinical note transcription with the patient, who gave verbal consent to proceed.  History of Present Illness   Cynthia [name] is a 63 year old female with Crohn's disease who presents for a med check and medication refill.  She has a history of Crohn's disease and underwent an ileostomy reversal earlier this year. 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 She continues to use a colostomy bag, which is now positioned three inches from its previous location.  She needs to come in for a physical exam as required by her insurance provider, [employer], before the end of the year. The last blood work was done in January during her hospital stay for the ileostomy reversal. She mentions previous issues with low calcium  and kidney function, and she had a kidney stone surgically removed three to four months ago, which caused significant pain.  She is currently taking octreotide  acetate at a dose of 100 mcg, administered three times daily. She brought a used shot to the appointment to confirm the dosage, as there was a discrepancy in the system showing 50 mcg. She experiences discomfort with the injections, stating they hurt when she administers them herself. She receives her medication through [facility] and has faced challenges with refills due to transportation issues, as she does not have a car.  She is followed by [facility] in [place].  Her social history includes being a former smoker, having quit several years ago, which she notes has saved her hundreds of dollars a month. She lives alone and has expressed reluctance to receive certain immunizations, although she has had the shingles vaccine in the past.           Review of Systems     Objective:     Physical Exam Alert and in no distress.  Cardiac exam shows regular rhythm without murmurs or gallops.  Lungs are clear to auscultation.  Abdominal exam does show an ostomy bag present.  No tenderness to palpation.      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 Reports pain with injections. - Continue octreotide  acetate 100 mcg TID. - Ordered blood work for calcium  and kidney function.  History of nephrolithiasis, status post surgical removal Nephrolithiasis surgically treated. - Ordered blood work for kidney function.  Immunization: pneumococcal vaccine recommended  Screening for lipoid disorders Lipid disorder screening due. - Ordered lipid panel.      She plans to get a car so she can continue to be cared for at [facility].

## 2024-10-06 ENCOUNTER — Ambulatory Visit: Payer: Self-pay | Admitting: Family Medicine

## 2024-10-06 LAB — CBC WITH DIFFERENTIAL/PLATELET
Basophils Absolute: 0 x10E3/uL (ref 0.0–0.2)
Basos: 0 %
EOS (ABSOLUTE): 0.2 x10E3/uL (ref 0.0–0.4)
Eos: 2 %
Hematocrit: 39.3 % (ref 34.0–46.6)
Hemoglobin: 13 g/dL (ref 11.1–15.9)
Immature Grans (Abs): 0 x10E3/uL (ref 0.0–0.1)
Immature Granulocytes: 0 %
Lymphocytes Absolute: 2 x10E3/uL (ref 0.7–3.1)
Lymphs: 20 %
MCH: 29.2 pg (ref 26.6–33.0)
MCHC: 33.1 g/dL (ref 31.5–35.7)
MCV: 88 fL (ref 79–97)
Monocytes Absolute: 0.4 x10E3/uL (ref 0.1–0.9)
Monocytes: 4 %
Neutrophils Absolute: 7.1 x10E3/uL — ABNORMAL HIGH (ref 1.4–7.0)
Neutrophils: 74 %
Platelets: 261 x10E3/uL (ref 150–450)
RBC: 4.45 x10E6/uL (ref 3.77–5.28)
RDW: 12.4 % (ref 11.7–15.4)
WBC: 9.8 x10E3/uL (ref 3.4–10.8)

## 2024-10-06 LAB — COMPREHENSIVE METABOLIC PANEL WITH GFR
ALT: 14 IU/L (ref 0–32)
AST: 15 IU/L (ref 0–40)
Albumin: 4.6 g/dL (ref 3.9–4.9)
Alkaline Phosphatase: 95 IU/L (ref 49–135)
BUN/Creatinine Ratio: 13 (ref 12–28)
BUN: 20 mg/dL (ref 8–27)
Bilirubin Total: 0.4 mg/dL (ref 0.0–1.2)
CO2: 19 mmol/L — ABNORMAL LOW (ref 20–29)
Calcium: 9.7 mg/dL (ref 8.7–10.3)
Chloride: 106 mmol/L (ref 96–106)
Creatinine, Ser: 1.56 mg/dL — ABNORMAL HIGH (ref 0.57–1.00)
Globulin, Total: 2.9 g/dL (ref 1.5–4.5)
Glucose: 79 mg/dL (ref 70–99)
Potassium: 3.9 mmol/L (ref 3.5–5.2)
Sodium: 140 mmol/L (ref 134–144)
Total Protein: 7.5 g/dL (ref 6.0–8.5)
eGFR: 37 mL/min/1.73 — ABNORMAL LOW (ref >59)

## 2024-10-06 LAB — LIPID PANEL
Chol/HDL Ratio: 1.6 ratio (ref 0.0–4.4)
Cholesterol, Total: 173 mg/dL (ref 100–199)
HDL: 105 mg/dL (ref >39)
LDL Chol Calc (NIH): 50 mg/dL (ref 0–99)
Triglycerides: 102 mg/dL (ref 0–149)
VLDL Cholesterol Cal: 18 mg/dL (ref 5–40)

## 2024-10-25 ENCOUNTER — Telehealth: Payer: Self-pay

## 2024-10-25 ENCOUNTER — Telehealth: Payer: Self-pay | Admitting: *Deleted

## 2024-10-25 NOTE — Telephone Encounter (Signed)
 Copied from CRM [id number]. Topic: Clinical - Prescription Issue >> Oct 25, 2024  1:05 PM Leonette SQUIBB wrote: Reason for CCRN: Patient called saying [employer] will be sending an order for medication for Crohn's dx. She will be getting a bottle of medications with the syringes.  Sent to [employer]

## 2024-10-25 NOTE — Telephone Encounter (Signed)
 Copied from CRM [id number]. Topic: Clinical - Prescription Issue >> Oct 25, 2024  1:05 PM Leonette SQUIBB wrote: Reason for CCRN: Patient called saying [employer] will be sending an order for medication for Crohn's dx. She will be getting a bottle of medications with the syringes.

## 2024-11-01 NOTE — Progress Notes (Signed)
 Cynthia [name]                                          MRN: [id number]   11/01/2024   The [facility] Quality Team Specialist reviewed this patient medical record for the purposes of chart review for care gap closure. The following were reviewed: chart review for care gap closure-breast cancer screening.    [facility] Quality Team

## 2025-04-12 ENCOUNTER — Ambulatory Visit: Admitting: Family Medicine
